# Patient Record
Sex: Female | Born: 1940 | Race: White | Hispanic: No | Marital: Married | Smoking: Never smoker
Health system: Southern US, Community
[De-identification: ages and names within clinical notes are randomized; demographics above are authoritative.]

## PROBLEM LIST (undated history)

## (undated) DIAGNOSIS — R531 Weakness: Secondary | ICD-10-CM

## (undated) DIAGNOSIS — C8228 Follicular lymphoma grade III, unspecified, lymph nodes of multiple sites: Principal | ICD-10-CM

## (undated) DIAGNOSIS — F32A Depression, unspecified: Secondary | ICD-10-CM

## (undated) DIAGNOSIS — D649 Anemia, unspecified: Secondary | ICD-10-CM

## (undated) DIAGNOSIS — I10 Essential (primary) hypertension: Secondary | ICD-10-CM

## (undated) DIAGNOSIS — I5022 Chronic systolic (congestive) heart failure: Principal | ICD-10-CM

## (undated) DIAGNOSIS — I480 Paroxysmal atrial fibrillation: Secondary | ICD-10-CM

## (undated) DIAGNOSIS — I428 Other cardiomyopathies: Secondary | ICD-10-CM

## (undated) DIAGNOSIS — K219 Gastro-esophageal reflux disease without esophagitis: Secondary | ICD-10-CM

## (undated) DIAGNOSIS — J189 Pneumonia, unspecified organism: Secondary | ICD-10-CM

## (undated) DIAGNOSIS — I739 Peripheral vascular disease, unspecified: Secondary | ICD-10-CM

## (undated) DIAGNOSIS — IMO0002 Reserved for concepts with insufficient information to code with codable children: Secondary | ICD-10-CM

## (undated) DIAGNOSIS — R011 Cardiac murmur, unspecified: Secondary | ICD-10-CM

## (undated) DIAGNOSIS — M1990 Unspecified osteoarthritis, unspecified site: Secondary | ICD-10-CM

## (undated) DIAGNOSIS — G620 Drug-induced polyneuropathy: Secondary | ICD-10-CM

## (undated) DIAGNOSIS — K635 Polyp of colon: Secondary | ICD-10-CM

## (undated) DIAGNOSIS — C189 Malignant neoplasm of colon, unspecified: Secondary | ICD-10-CM

## (undated) DIAGNOSIS — Z9081 Acquired absence of spleen: Secondary | ICD-10-CM

## (undated) DIAGNOSIS — C801 Malignant (primary) neoplasm, unspecified: Secondary | ICD-10-CM

## (undated) DIAGNOSIS — C8590 Non-Hodgkin lymphoma, unspecified, unspecified site: Secondary | ICD-10-CM

## (undated) DIAGNOSIS — T451X5A Adverse effect of antineoplastic and immunosuppressive drugs, initial encounter: Secondary | ICD-10-CM

## (undated) DIAGNOSIS — E039 Hypothyroidism, unspecified: Secondary | ICD-10-CM

## (undated) DIAGNOSIS — F329 Major depressive disorder, single episode, unspecified: Secondary | ICD-10-CM

## (undated) DIAGNOSIS — J45909 Unspecified asthma, uncomplicated: Secondary | ICD-10-CM

## (undated) HISTORY — DX: Follicular lymphoma grade III, unspecified, lymph nodes of multiple sites: C82.28

## (undated) HISTORY — DX: Hypothyroidism, unspecified: E03.9

## (undated) HISTORY — DX: Adverse effect of antineoplastic and immunosuppressive drugs, initial encounter: T45.1X5A

## (undated) HISTORY — DX: Chronic systolic (congestive) heart failure: I50.22

## (undated) HISTORY — DX: Malignant (primary) neoplasm, unspecified: C80.1

## (undated) HISTORY — PX: OTHER SURGICAL HISTORY: SHX169

## (undated) HISTORY — DX: Non-Hodgkin lymphoma, unspecified, unspecified site: C85.90

## (undated) HISTORY — DX: Drug-induced polyneuropathy: G62.0

## (undated) HISTORY — DX: Unspecified osteoarthritis, unspecified site: M19.90

## (undated) HISTORY — DX: Malignant neoplasm of colon, unspecified: C18.9

## (undated) HISTORY — PX: CHOLECYSTECTOMY: SHX55

## (undated) HISTORY — DX: Unspecified asthma, uncomplicated: J45.909

## (undated) HISTORY — PX: VAGINAL HYSTERECTOMY: SUR661

## (undated) HISTORY — DX: Peripheral vascular disease, unspecified: I73.9

## (undated) HISTORY — PX: COLONOSCOPY W/ BIOPSIES: SHX1374

## (undated) HISTORY — DX: Other cardiomyopathies: I42.8

## (undated) HISTORY — DX: Paroxysmal atrial fibrillation: I48.0

## (undated) HISTORY — DX: Weakness: R53.1

## (undated) HISTORY — DX: Essential (primary) hypertension: I10

## (undated) HISTORY — DX: Reserved for concepts with insufficient information to code with codable children: IMO0002

## (undated) HISTORY — PX: SPLENECTOMY: SUR1306

## (undated) HISTORY — DX: Depression, unspecified: F32.A

## (undated) HISTORY — DX: Acquired absence of spleen: Z90.81

## (undated) HISTORY — PX: TENDON RELEASE: SHX230

## (undated) HISTORY — DX: Polyp of colon: K63.5

## (undated) HISTORY — PX: PLANTAR FASCIA RELEASE: SHX2239

## (undated) HISTORY — PX: DG BIOPSY LUNG: HXRAD146

## (undated) HISTORY — DX: Major depressive disorder, single episode, unspecified: F32.9

---

## 1994-02-10 HISTORY — PX: BREAST BIOPSY: SHX20

## 1996-02-11 HISTORY — PX: VENTRAL HERNIA REPAIR: SHX424

## 1998-07-02 ENCOUNTER — Encounter (HOSPITAL_COMMUNITY): Payer: Self-pay | Admitting: Oncology

## 1998-07-02 ENCOUNTER — Ambulatory Visit (HOSPITAL_COMMUNITY): Admission: RE | Admit: 1998-07-02 | Discharge: 1998-07-02 | Payer: Self-pay | Admitting: Oncology

## 1999-12-06 ENCOUNTER — Encounter: Payer: Self-pay | Admitting: Family Medicine

## 1999-12-06 ENCOUNTER — Encounter: Admission: RE | Admit: 1999-12-06 | Discharge: 1999-12-06 | Payer: Self-pay | Admitting: Family Medicine

## 1999-12-19 ENCOUNTER — Encounter: Payer: Self-pay | Admitting: Family Medicine

## 1999-12-19 ENCOUNTER — Encounter: Admission: RE | Admit: 1999-12-19 | Discharge: 1999-12-19 | Payer: Self-pay | Admitting: Family Medicine

## 2000-12-15 ENCOUNTER — Ambulatory Visit (HOSPITAL_COMMUNITY): Admission: RE | Admit: 2000-12-15 | Discharge: 2000-12-15 | Payer: Self-pay | Admitting: Oncology

## 2000-12-15 ENCOUNTER — Other Ambulatory Visit: Admission: RE | Admit: 2000-12-15 | Discharge: 2000-12-15 | Payer: Self-pay | Admitting: Oncology

## 2000-12-15 ENCOUNTER — Encounter (HOSPITAL_COMMUNITY): Payer: Self-pay | Admitting: Oncology

## 2000-12-26 ENCOUNTER — Ambulatory Visit (HOSPITAL_COMMUNITY): Admission: RE | Admit: 2000-12-26 | Discharge: 2000-12-26 | Payer: Self-pay | Admitting: Oncology

## 2000-12-26 ENCOUNTER — Encounter (HOSPITAL_COMMUNITY): Payer: Self-pay | Admitting: Oncology

## 2001-02-01 ENCOUNTER — Inpatient Hospital Stay (HOSPITAL_COMMUNITY): Admission: EM | Admit: 2001-02-01 | Discharge: 2001-02-03 | Payer: Self-pay | Admitting: Emergency Medicine

## 2001-02-01 ENCOUNTER — Encounter: Payer: Self-pay | Admitting: Emergency Medicine

## 2001-02-02 ENCOUNTER — Encounter (HOSPITAL_COMMUNITY): Payer: Self-pay | Admitting: Oncology

## 2001-02-05 ENCOUNTER — Ambulatory Visit (HOSPITAL_COMMUNITY): Admission: RE | Admit: 2001-02-05 | Discharge: 2001-02-05 | Payer: Self-pay | Admitting: Oncology

## 2001-02-05 ENCOUNTER — Encounter (HOSPITAL_COMMUNITY): Payer: Self-pay | Admitting: Oncology

## 2001-02-08 ENCOUNTER — Inpatient Hospital Stay (HOSPITAL_COMMUNITY): Admission: EM | Admit: 2001-02-08 | Discharge: 2001-02-17 | Payer: Self-pay | Admitting: Oncology

## 2001-02-08 ENCOUNTER — Encounter (INDEPENDENT_AMBULATORY_CARE_PROVIDER_SITE_OTHER): Payer: Self-pay | Admitting: Specialist

## 2001-02-08 ENCOUNTER — Encounter (HOSPITAL_COMMUNITY): Payer: Self-pay | Admitting: Oncology

## 2001-02-08 ENCOUNTER — Ambulatory Visit (HOSPITAL_COMMUNITY): Admission: RE | Admit: 2001-02-08 | Discharge: 2001-02-08 | Payer: Self-pay | Admitting: Oncology

## 2001-02-09 ENCOUNTER — Encounter (HOSPITAL_COMMUNITY): Payer: Self-pay | Admitting: Oncology

## 2001-02-10 ENCOUNTER — Encounter (HOSPITAL_COMMUNITY): Payer: Self-pay | Admitting: Oncology

## 2001-02-12 ENCOUNTER — Encounter: Payer: Self-pay | Admitting: Pulmonary Disease

## 2001-02-13 ENCOUNTER — Encounter (HOSPITAL_COMMUNITY): Payer: Self-pay | Admitting: Oncology

## 2001-02-15 ENCOUNTER — Encounter (HOSPITAL_COMMUNITY): Payer: Self-pay | Admitting: Oncology

## 2001-02-17 ENCOUNTER — Encounter: Payer: Self-pay | Admitting: Pulmonary Disease

## 2001-03-22 ENCOUNTER — Other Ambulatory Visit: Admission: RE | Admit: 2001-03-22 | Discharge: 2001-03-22 | Payer: Self-pay | Admitting: Family Medicine

## 2001-03-30 ENCOUNTER — Encounter: Payer: Self-pay | Admitting: Family Medicine

## 2001-03-30 ENCOUNTER — Ambulatory Visit (HOSPITAL_COMMUNITY): Admission: RE | Admit: 2001-03-30 | Discharge: 2001-03-30 | Payer: Self-pay | Admitting: Oncology

## 2001-03-30 ENCOUNTER — Encounter: Admission: RE | Admit: 2001-03-30 | Discharge: 2001-03-30 | Payer: Self-pay | Admitting: Family Medicine

## 2001-03-30 ENCOUNTER — Encounter (HOSPITAL_COMMUNITY): Payer: Self-pay | Admitting: Oncology

## 2001-05-21 ENCOUNTER — Emergency Department (HOSPITAL_COMMUNITY): Admission: EM | Admit: 2001-05-21 | Discharge: 2001-05-21 | Payer: Self-pay | Admitting: Emergency Medicine

## 2002-03-24 ENCOUNTER — Other Ambulatory Visit: Admission: RE | Admit: 2002-03-24 | Discharge: 2002-03-24 | Payer: Self-pay | Admitting: Family Medicine

## 2002-03-31 ENCOUNTER — Encounter: Payer: Self-pay | Admitting: Family Medicine

## 2002-03-31 ENCOUNTER — Encounter: Admission: RE | Admit: 2002-03-31 | Discharge: 2002-03-31 | Payer: Self-pay | Admitting: Family Medicine

## 2002-04-29 ENCOUNTER — Encounter (HOSPITAL_COMMUNITY): Payer: Self-pay | Admitting: Oncology

## 2002-04-29 ENCOUNTER — Ambulatory Visit (HOSPITAL_COMMUNITY): Admission: RE | Admit: 2002-04-29 | Discharge: 2002-04-29 | Payer: Self-pay | Admitting: Oncology

## 2002-12-14 ENCOUNTER — Ambulatory Visit (HOSPITAL_COMMUNITY): Admission: RE | Admit: 2002-12-14 | Discharge: 2002-12-14 | Payer: Self-pay | Admitting: Oncology

## 2003-03-27 ENCOUNTER — Other Ambulatory Visit: Admission: RE | Admit: 2003-03-27 | Discharge: 2003-03-27 | Payer: Self-pay | Admitting: Family Medicine

## 2003-04-06 ENCOUNTER — Ambulatory Visit (HOSPITAL_COMMUNITY): Admission: RE | Admit: 2003-04-06 | Discharge: 2003-04-06 | Payer: Self-pay | Admitting: Oncology

## 2003-04-13 ENCOUNTER — Encounter: Admission: RE | Admit: 2003-04-13 | Discharge: 2003-04-13 | Payer: Self-pay | Admitting: Family Medicine

## 2003-05-03 ENCOUNTER — Ambulatory Visit (HOSPITAL_COMMUNITY): Admission: RE | Admit: 2003-05-03 | Discharge: 2003-05-03 | Payer: Self-pay | Admitting: Oncology

## 2003-05-03 ENCOUNTER — Encounter (INDEPENDENT_AMBULATORY_CARE_PROVIDER_SITE_OTHER): Payer: Self-pay | Admitting: Specialist

## 2003-12-14 ENCOUNTER — Ambulatory Visit: Payer: Self-pay | Admitting: Oncology

## 2003-12-28 ENCOUNTER — Ambulatory Visit: Payer: Self-pay | Admitting: Family Medicine

## 2004-02-07 ENCOUNTER — Ambulatory Visit: Payer: Self-pay | Admitting: Family Medicine

## 2004-03-08 ENCOUNTER — Ambulatory Visit: Payer: Self-pay | Admitting: Oncology

## 2004-03-14 ENCOUNTER — Ambulatory Visit: Payer: Self-pay | Admitting: Family Medicine

## 2004-03-15 ENCOUNTER — Ambulatory Visit: Payer: Self-pay | Admitting: Family Medicine

## 2004-04-19 ENCOUNTER — Ambulatory Visit: Payer: Self-pay | Admitting: Family Medicine

## 2004-04-19 ENCOUNTER — Other Ambulatory Visit: Admission: RE | Admit: 2004-04-19 | Discharge: 2004-04-19 | Payer: Self-pay | Admitting: Family Medicine

## 2004-05-03 ENCOUNTER — Encounter: Admission: RE | Admit: 2004-05-03 | Discharge: 2004-05-03 | Payer: Self-pay | Admitting: Family Medicine

## 2004-05-13 ENCOUNTER — Encounter: Admission: RE | Admit: 2004-05-13 | Discharge: 2004-05-13 | Payer: Self-pay | Admitting: Family Medicine

## 2004-05-13 ENCOUNTER — Encounter (INDEPENDENT_AMBULATORY_CARE_PROVIDER_SITE_OTHER): Payer: Self-pay | Admitting: *Deleted

## 2004-05-17 ENCOUNTER — Ambulatory Visit: Payer: Self-pay | Admitting: Oncology

## 2004-05-17 ENCOUNTER — Ambulatory Visit: Payer: Self-pay | Admitting: Gastroenterology

## 2004-05-31 ENCOUNTER — Ambulatory Visit: Payer: Self-pay | Admitting: Gastroenterology

## 2004-06-07 ENCOUNTER — Ambulatory Visit: Payer: Self-pay | Admitting: Family Medicine

## 2004-07-04 ENCOUNTER — Ambulatory Visit: Payer: Self-pay | Admitting: Oncology

## 2004-07-05 ENCOUNTER — Ambulatory Visit: Payer: Self-pay | Admitting: Family Medicine

## 2004-07-30 ENCOUNTER — Ambulatory Visit: Payer: Self-pay | Admitting: Family Medicine

## 2004-08-27 ENCOUNTER — Ambulatory Visit: Payer: Self-pay | Admitting: Family Medicine

## 2004-08-28 ENCOUNTER — Ambulatory Visit: Payer: Self-pay | Admitting: Cardiology

## 2004-09-26 ENCOUNTER — Ambulatory Visit: Payer: Self-pay | Admitting: Oncology

## 2004-10-04 ENCOUNTER — Ambulatory Visit: Payer: Self-pay | Admitting: Family Medicine

## 2004-10-24 ENCOUNTER — Ambulatory Visit: Payer: Self-pay | Admitting: Family Medicine

## 2004-11-07 ENCOUNTER — Ambulatory Visit: Payer: Self-pay | Admitting: Family Medicine

## 2004-11-15 ENCOUNTER — Ambulatory Visit: Payer: Self-pay | Admitting: Family Medicine

## 2004-11-19 ENCOUNTER — Ambulatory Visit: Payer: Self-pay | Admitting: Family Medicine

## 2004-12-02 ENCOUNTER — Ambulatory Visit (HOSPITAL_COMMUNITY): Admission: RE | Admit: 2004-12-02 | Discharge: 2004-12-02 | Payer: Self-pay | Admitting: Oncology

## 2004-12-05 ENCOUNTER — Ambulatory Visit: Payer: Self-pay | Admitting: Oncology

## 2004-12-06 ENCOUNTER — Other Ambulatory Visit: Admission: RE | Admit: 2004-12-06 | Discharge: 2004-12-06 | Payer: Self-pay | Admitting: Oncology

## 2004-12-06 ENCOUNTER — Encounter (HOSPITAL_COMMUNITY): Payer: Self-pay | Admitting: Oncology

## 2004-12-06 ENCOUNTER — Ambulatory Visit: Payer: Self-pay | Admitting: Internal Medicine

## 2004-12-17 ENCOUNTER — Ambulatory Visit: Payer: Self-pay | Admitting: Family Medicine

## 2005-02-13 ENCOUNTER — Ambulatory Visit: Payer: Self-pay | Admitting: Family Medicine

## 2005-03-07 ENCOUNTER — Ambulatory Visit: Payer: Self-pay | Admitting: Oncology

## 2005-06-05 ENCOUNTER — Ambulatory Visit: Payer: Self-pay | Admitting: Oncology

## 2005-06-06 LAB — CBC WITH DIFFERENTIAL/PLATELET
BASO%: 0.9 % (ref 0.0–2.0)
EOS%: 3.3 % (ref 0.0–7.0)
MCH: 30.6 pg (ref 26.0–34.0)
MCHC: 33.1 g/dL (ref 32.0–36.0)
MCV: 92.6 fL (ref 81.0–101.0)
MONO%: 13.5 % — ABNORMAL HIGH (ref 0.0–13.0)
RBC: 4.19 10*6/uL (ref 3.70–5.32)
RDW: 13.9 % (ref 11.3–14.5)
WBC: 6.7 10*3/uL (ref 3.9–10.0)

## 2005-06-06 LAB — COMPREHENSIVE METABOLIC PANEL
ALT: 28 U/L (ref 0–40)
AST: 26 U/L (ref 0–37)
Alkaline Phosphatase: 81 U/L (ref 39–117)
BUN: 11 mg/dL (ref 6–23)
Calcium: 9 mg/dL (ref 8.4–10.5)
Creatinine, Ser: 0.8 mg/dL (ref 0.4–1.2)
Total Bilirubin: 0.4 mg/dL (ref 0.3–1.2)

## 2005-06-11 ENCOUNTER — Encounter: Payer: Self-pay | Admitting: Family Medicine

## 2005-06-11 ENCOUNTER — Other Ambulatory Visit: Admission: RE | Admit: 2005-06-11 | Discharge: 2005-06-11 | Payer: Self-pay | Admitting: Family Medicine

## 2005-06-11 ENCOUNTER — Ambulatory Visit: Payer: Self-pay | Admitting: Family Medicine

## 2005-06-11 LAB — CONVERTED CEMR LAB: Pap Smear: NORMAL

## 2005-06-24 ENCOUNTER — Encounter: Admission: RE | Admit: 2005-06-24 | Discharge: 2005-06-24 | Payer: Self-pay | Admitting: Family Medicine

## 2005-09-02 ENCOUNTER — Ambulatory Visit (HOSPITAL_COMMUNITY): Admission: RE | Admit: 2005-09-02 | Discharge: 2005-09-02 | Payer: Self-pay | Admitting: Oncology

## 2005-09-03 ENCOUNTER — Ambulatory Visit: Payer: Self-pay | Admitting: Oncology

## 2005-09-08 LAB — CBC WITH DIFFERENTIAL/PLATELET
BASO%: 0.4 % (ref 0.0–2.0)
Basophils Absolute: 0 10e3/uL (ref 0.0–0.1)
EOS%: 2.8 % (ref 0.0–7.0)
Eosinophils Absolute: 0.2 10e3/uL (ref 0.0–0.5)
HCT: 39.9 % (ref 34.8–46.6)
HGB: 13.3 g/dL (ref 11.6–15.9)
LYMPH%: 32 % (ref 14.0–48.0)
MCH: 31 pg (ref 26.0–34.0)
MCHC: 33.4 g/dL (ref 32.0–36.0)
MCV: 92.6 fL (ref 81.0–101.0)
MONO#: 0.8 10e3/uL (ref 0.1–0.9)
MONO%: 11.4 % (ref 0.0–13.0)
NEUT#: 3.7 10e3/uL (ref 1.5–6.5)
NEUT%: 53.4 % (ref 39.6–76.8)
Platelets: 320 10e3/uL (ref 145–400)
RBC: 4.31 10e6/uL (ref 3.70–5.32)
RDW: 14 % (ref 11.3–14.5)
WBC: 6.9 10e3/uL (ref 3.9–10.0)
lymph#: 2.2 10e3/uL (ref 0.9–3.3)

## 2005-09-08 LAB — COMPREHENSIVE METABOLIC PANEL
ALT: 24 U/L (ref 0–40)
BUN: 9 mg/dL (ref 6–23)
CO2: 29 mEq/L (ref 19–32)
Creatinine, Ser: 0.8 mg/dL (ref 0.40–1.20)
Glucose, Bld: 136 mg/dL — ABNORMAL HIGH (ref 70–99)
Total Bilirubin: 0.4 mg/dL (ref 0.3–1.2)

## 2005-09-08 LAB — LACTATE DEHYDROGENASE: LDH: 149 U/L (ref 94–250)

## 2005-12-03 ENCOUNTER — Ambulatory Visit: Payer: Self-pay | Admitting: Oncology

## 2005-12-12 ENCOUNTER — Ambulatory Visit: Payer: Self-pay | Admitting: Family Medicine

## 2006-01-23 ENCOUNTER — Ambulatory Visit: Payer: Self-pay | Admitting: Family Medicine

## 2006-01-28 ENCOUNTER — Ambulatory Visit: Payer: Self-pay | Admitting: Family Medicine

## 2006-03-03 ENCOUNTER — Ambulatory Visit: Payer: Self-pay | Admitting: Oncology

## 2006-03-06 LAB — COMPREHENSIVE METABOLIC PANEL
ALT: 26 U/L (ref 0–35)
AST: 25 U/L (ref 0–37)
CO2: 27 mEq/L (ref 19–32)
Chloride: 104 mEq/L (ref 96–112)
Sodium: 140 mEq/L (ref 135–145)
Total Bilirubin: 0.4 mg/dL (ref 0.3–1.2)
Total Protein: 6.3 g/dL (ref 6.0–8.3)

## 2006-03-06 LAB — CBC WITH DIFFERENTIAL/PLATELET
BASO%: 0.2 % (ref 0.0–2.0)
EOS%: 4.1 % (ref 0.0–7.0)
LYMPH%: 35.9 % (ref 14.0–48.0)
MCHC: 33.2 g/dL (ref 32.0–36.0)
MONO#: 1 10*3/uL — ABNORMAL HIGH (ref 0.1–0.9)
RBC: 4.29 10*6/uL (ref 3.70–5.32)
lymph#: 2.2 10*3/uL (ref 0.9–3.3)

## 2006-03-06 LAB — LACTATE DEHYDROGENASE: LDH: 134 U/L (ref 94–250)

## 2006-05-12 ENCOUNTER — Ambulatory Visit: Payer: Self-pay | Admitting: Family Medicine

## 2006-06-25 ENCOUNTER — Encounter: Payer: Self-pay | Admitting: Family Medicine

## 2006-06-25 DIAGNOSIS — Z85828 Personal history of other malignant neoplasm of skin: Secondary | ICD-10-CM

## 2006-06-25 DIAGNOSIS — Z860100 Personal history of colon polyps, unspecified: Secondary | ICD-10-CM | POA: Insufficient documentation

## 2006-06-25 DIAGNOSIS — E039 Hypothyroidism, unspecified: Secondary | ICD-10-CM | POA: Insufficient documentation

## 2006-06-25 DIAGNOSIS — R609 Edema, unspecified: Secondary | ICD-10-CM

## 2006-06-25 DIAGNOSIS — Z8572 Personal history of non-Hodgkin lymphomas: Secondary | ICD-10-CM

## 2006-06-25 DIAGNOSIS — I739 Peripheral vascular disease, unspecified: Secondary | ICD-10-CM | POA: Insufficient documentation

## 2006-06-25 DIAGNOSIS — F329 Major depressive disorder, single episode, unspecified: Secondary | ICD-10-CM

## 2006-06-25 DIAGNOSIS — M1990 Unspecified osteoarthritis, unspecified site: Secondary | ICD-10-CM | POA: Insufficient documentation

## 2006-07-01 ENCOUNTER — Ambulatory Visit: Payer: Self-pay | Admitting: Oncology

## 2006-07-03 LAB — CBC WITH DIFFERENTIAL/PLATELET
BASO%: 0.3 % (ref 0.0–2.0)
EOS%: 3.5 % (ref 0.0–7.0)
HCT: 40.2 % (ref 34.8–46.6)
LYMPH%: 42.7 % (ref 14.0–48.0)
MCH: 31.6 pg (ref 26.0–34.0)
MCHC: 34.9 g/dL (ref 32.0–36.0)
MONO%: 16.3 % — ABNORMAL HIGH (ref 0.0–13.0)
NEUT%: 37.2 % — ABNORMAL LOW (ref 39.6–76.8)
Platelets: 264 10*3/uL (ref 145–400)
RBC: 4.44 10*6/uL (ref 3.70–5.32)

## 2006-07-03 LAB — COMPREHENSIVE METABOLIC PANEL
ALT: 44 U/L — ABNORMAL HIGH (ref 0–35)
AST: 46 U/L — ABNORMAL HIGH (ref 0–37)
Alkaline Phosphatase: 87 U/L (ref 39–117)
Creatinine, Ser: 0.86 mg/dL (ref 0.40–1.20)
Total Bilirubin: 0.6 mg/dL (ref 0.3–1.2)

## 2006-07-03 LAB — LACTATE DEHYDROGENASE: LDH: 152 U/L (ref 94–250)

## 2006-07-08 ENCOUNTER — Ambulatory Visit: Payer: Self-pay | Admitting: Family Medicine

## 2006-07-08 DIAGNOSIS — J309 Allergic rhinitis, unspecified: Secondary | ICD-10-CM | POA: Insufficient documentation

## 2006-07-08 DIAGNOSIS — N318 Other neuromuscular dysfunction of bladder: Secondary | ICD-10-CM

## 2006-07-08 DIAGNOSIS — E785 Hyperlipidemia, unspecified: Secondary | ICD-10-CM

## 2006-07-09 LAB — CONVERTED CEMR LAB
HDL: 47.6 mg/dL (ref >39.0)
VLDL: 32 mg/dL (ref 0–40)

## 2006-07-10 ENCOUNTER — Telehealth (INDEPENDENT_AMBULATORY_CARE_PROVIDER_SITE_OTHER): Payer: Self-pay | Admitting: *Deleted

## 2006-07-23 ENCOUNTER — Encounter: Payer: Self-pay | Admitting: Family Medicine

## 2006-07-24 ENCOUNTER — Encounter: Admission: RE | Admit: 2006-07-24 | Discharge: 2006-07-24 | Payer: Self-pay | Admitting: Family Medicine

## 2006-07-28 ENCOUNTER — Encounter (INDEPENDENT_AMBULATORY_CARE_PROVIDER_SITE_OTHER): Payer: Self-pay | Admitting: *Deleted

## 2006-08-31 ENCOUNTER — Telehealth (INDEPENDENT_AMBULATORY_CARE_PROVIDER_SITE_OTHER): Payer: Self-pay | Admitting: *Deleted

## 2006-10-28 ENCOUNTER — Ambulatory Visit: Payer: Self-pay | Admitting: Oncology

## 2006-11-06 ENCOUNTER — Encounter: Payer: Self-pay | Admitting: Family Medicine

## 2006-11-06 LAB — CBC WITH DIFFERENTIAL/PLATELET
Basophils Absolute: 0 10*3/uL (ref 0.0–0.1)
EOS%: 2.8 % (ref 0.0–7.0)
HCT: 38.2 % (ref 34.8–46.6)
HGB: 13.3 g/dL (ref 11.6–15.9)
LYMPH%: 48.7 % — ABNORMAL HIGH (ref 14.0–48.0)
MCH: 32.2 pg (ref 26.0–34.0)
MCV: 92.3 fL (ref 81.0–101.0)
NEUT%: 36.8 % — ABNORMAL LOW (ref 39.6–76.8)
Platelets: 229 10*3/uL (ref 145–400)
lymph#: 4.1 10*3/uL — ABNORMAL HIGH (ref 0.9–3.3)

## 2006-11-06 LAB — COMPREHENSIVE METABOLIC PANEL
AST: 31 U/L (ref 0–37)
BUN: 10 mg/dL (ref 6–23)
Calcium: 8.8 mg/dL (ref 8.4–10.5)
Chloride: 101 mEq/L (ref 96–112)
Creatinine, Ser: 0.77 mg/dL (ref 0.40–1.20)

## 2006-11-06 LAB — LACTATE DEHYDROGENASE: LDH: 159 U/L (ref 94–250)

## 2006-11-20 ENCOUNTER — Ambulatory Visit: Payer: Self-pay | Admitting: Family Medicine

## 2006-11-20 ENCOUNTER — Ambulatory Visit (HOSPITAL_COMMUNITY): Admission: RE | Admit: 2006-11-20 | Discharge: 2006-11-20 | Payer: Self-pay | Admitting: Oncology

## 2007-03-10 ENCOUNTER — Ambulatory Visit: Payer: Self-pay | Admitting: Oncology

## 2007-03-12 ENCOUNTER — Encounter: Payer: Self-pay | Admitting: Family Medicine

## 2007-03-12 LAB — CBC WITH DIFFERENTIAL/PLATELET
Basophils Absolute: 0 10*3/uL (ref 0.0–0.1)
EOS%: 3.5 % (ref 0.0–7.0)
Eosinophils Absolute: 0.3 10*3/uL (ref 0.0–0.5)
HCT: 40.5 % (ref 34.8–46.6)
HGB: 13.8 g/dL (ref 11.6–15.9)
MCH: 31.6 pg (ref 26.0–34.0)
MONO#: 0.9 10*3/uL (ref 0.1–0.9)
NEUT#: 3 10*3/uL (ref 1.5–6.5)
NEUT%: 40.8 % (ref 39.6–76.8)
RDW: 13.9 % (ref 11.3–14.5)
WBC: 7.4 10*3/uL (ref 3.9–10.0)
lymph#: 3.1 10*3/uL (ref 0.9–3.3)

## 2007-03-12 LAB — COMPREHENSIVE METABOLIC PANEL
AST: 25 U/L (ref 0–37)
Albumin: 4.6 g/dL (ref 3.5–5.2)
BUN: 13 mg/dL (ref 6–23)
CO2: 26 mEq/L (ref 19–32)
Calcium: 9.5 mg/dL (ref 8.4–10.5)
Chloride: 103 mEq/L (ref 96–112)
Creatinine, Ser: 0.81 mg/dL (ref 0.40–1.20)
Potassium: 4.3 mEq/L (ref 3.5–5.3)

## 2007-03-12 LAB — LACTATE DEHYDROGENASE: LDH: 136 U/L (ref 94–250)

## 2007-06-10 ENCOUNTER — Ambulatory Visit: Payer: Self-pay | Admitting: Family Medicine

## 2007-06-15 ENCOUNTER — Encounter (INDEPENDENT_AMBULATORY_CARE_PROVIDER_SITE_OTHER): Payer: Self-pay | Admitting: *Deleted

## 2007-06-15 ENCOUNTER — Telehealth: Payer: Self-pay | Admitting: Family Medicine

## 2007-06-16 ENCOUNTER — Encounter (INDEPENDENT_AMBULATORY_CARE_PROVIDER_SITE_OTHER): Payer: Self-pay | Admitting: *Deleted

## 2007-07-07 ENCOUNTER — Ambulatory Visit: Payer: Self-pay | Admitting: Oncology

## 2007-07-09 ENCOUNTER — Encounter: Payer: Self-pay | Admitting: Family Medicine

## 2007-07-09 LAB — COMPREHENSIVE METABOLIC PANEL
ALT: 21 U/L (ref 0–35)
Alkaline Phosphatase: 63 U/L (ref 39–117)
Sodium: 141 mEq/L (ref 135–145)
Total Bilirubin: 0.5 mg/dL (ref 0.3–1.2)
Total Protein: 6.3 g/dL (ref 6.0–8.3)

## 2007-07-09 LAB — CBC WITH DIFFERENTIAL/PLATELET
BASO%: 0 % (ref 0.0–2.0)
LYMPH%: 31.6 % (ref 14.0–48.0)
MCHC: 33.7 g/dL (ref 32.0–36.0)
MCV: 92.1 fL (ref 81.0–101.0)
MONO%: 16.8 % — ABNORMAL HIGH (ref 0.0–13.0)
Platelets: 275 10*3/uL (ref 145–400)
RBC: 4.29 10*6/uL (ref 3.70–5.32)

## 2007-07-13 ENCOUNTER — Ambulatory Visit: Payer: Self-pay | Admitting: Family Medicine

## 2007-07-13 DIAGNOSIS — N951 Menopausal and female climacteric states: Secondary | ICD-10-CM

## 2007-07-29 ENCOUNTER — Encounter: Admission: RE | Admit: 2007-07-29 | Discharge: 2007-07-29 | Payer: Self-pay | Admitting: Family Medicine

## 2007-08-03 ENCOUNTER — Encounter (INDEPENDENT_AMBULATORY_CARE_PROVIDER_SITE_OTHER): Payer: Self-pay | Admitting: *Deleted

## 2007-08-10 ENCOUNTER — Ambulatory Visit: Payer: Self-pay | Admitting: Family Medicine

## 2007-08-16 LAB — CONVERTED CEMR LAB: Vit D, 1,25-Dihydroxy: 38 (ref 30–89)

## 2007-08-20 ENCOUNTER — Encounter: Payer: Self-pay | Admitting: Family Medicine

## 2007-11-10 ENCOUNTER — Ambulatory Visit: Payer: Self-pay | Admitting: Oncology

## 2007-11-12 ENCOUNTER — Encounter: Payer: Self-pay | Admitting: Family Medicine

## 2007-11-12 LAB — CBC WITH DIFFERENTIAL/PLATELET
BASO%: 0.6 % (ref 0.0–2.0)
EOS%: 3.8 % (ref 0.0–7.0)
HCT: 40.6 % (ref 34.8–46.6)
LYMPH%: 32.9 % (ref 14.0–48.0)
MCH: 31.4 pg (ref 26.0–34.0)
MCHC: 34 g/dL (ref 32.0–36.0)
MCV: 92.2 fL (ref 81.0–101.0)
MONO%: 15.6 % — ABNORMAL HIGH (ref 0.0–13.0)
NEUT%: 47.1 % (ref 39.6–76.8)
Platelets: 225 10*3/uL (ref 145–400)

## 2007-11-12 LAB — COMPREHENSIVE METABOLIC PANEL
ALT: 24 U/L (ref 0–35)
AST: 23 U/L (ref 0–37)
Creatinine, Ser: 0.8 mg/dL (ref 0.40–1.20)
Total Bilirubin: 0.6 mg/dL (ref 0.3–1.2)

## 2007-11-12 LAB — LACTATE DEHYDROGENASE: LDH: 141 U/L (ref 94–250)

## 2007-12-15 ENCOUNTER — Ambulatory Visit: Payer: Self-pay | Admitting: Family Medicine

## 2008-01-19 ENCOUNTER — Ambulatory Visit: Payer: Self-pay | Admitting: Family Medicine

## 2008-03-01 ENCOUNTER — Ambulatory Visit: Payer: Self-pay | Admitting: Family Medicine

## 2008-03-02 ENCOUNTER — Ambulatory Visit: Payer: Self-pay | Admitting: Family Medicine

## 2008-04-05 ENCOUNTER — Ambulatory Visit: Payer: Self-pay | Admitting: Oncology

## 2008-04-07 ENCOUNTER — Encounter: Payer: Self-pay | Admitting: Family Medicine

## 2008-04-07 LAB — CBC WITH DIFFERENTIAL/PLATELET
Basophils Absolute: 0 10*3/uL (ref 0.0–0.1)
EOS%: 1.8 % (ref 0.0–7.0)
Eosinophils Absolute: 0.1 10*3/uL (ref 0.0–0.5)
HCT: 40.5 % (ref 34.8–46.6)
HGB: 13.4 g/dL (ref 11.6–15.9)
MCH: 31 pg (ref 25.1–34.0)
MCV: 93.6 fL (ref 79.5–101.0)
MONO%: 11.8 % (ref 0.0–14.0)
NEUT#: 4.2 10*3/uL (ref 1.5–6.5)
NEUT%: 59.6 % (ref 38.4–76.8)
RDW: 14.1 % (ref 11.2–14.5)

## 2008-04-07 LAB — LACTATE DEHYDROGENASE: LDH: 146 U/L (ref 94–250)

## 2008-04-07 LAB — COMPREHENSIVE METABOLIC PANEL
ALT: 15 U/L (ref 0–35)
Alkaline Phosphatase: 69 U/L (ref 39–117)
CO2: 28 mEq/L (ref 19–32)
Sodium: 141 mEq/L (ref 135–145)
Total Bilirubin: 0.5 mg/dL (ref 0.3–1.2)
Total Protein: 6.4 g/dL (ref 6.0–8.3)

## 2008-05-04 ENCOUNTER — Ambulatory Visit: Payer: Self-pay | Admitting: Family Medicine

## 2008-07-05 ENCOUNTER — Ambulatory Visit: Payer: Self-pay | Admitting: Family Medicine

## 2008-07-07 LAB — CONVERTED CEMR LAB
ALT: 21 units/L (ref 0–35)
Alkaline Phosphatase: 61 units/L (ref 39–117)
Basophils Absolute: 0 10*3/uL (ref 0.0–0.1)
Calcium: 9.2 mg/dL (ref 8.4–10.5)
Chloride: 111 meq/L (ref 96–112)
Creatinine, Ser: 0.8 mg/dL (ref 0.4–1.2)
Glucose, Bld: 101 mg/dL — ABNORMAL HIGH (ref 70–99)
HCT: 40.6 % (ref 36.0–46.0)
Hemoglobin: 13.8 g/dL (ref 12.0–15.0)
LDL Cholesterol: 100 mg/dL — ABNORMAL HIGH (ref 0–99)
Lymphocytes Relative: 33 % (ref 12.0–46.0)
MCV: 93.8 fL (ref 78.0–100.0)
Monocytes Relative: 18.6 % — ABNORMAL HIGH (ref 3.0–12.0)
Phosphorus: 4.9 mg/dL — ABNORMAL HIGH (ref 2.3–4.6)
RDW: 13.2 % (ref 11.5–14.6)
TSH: 2.67 microintl units/mL (ref 0.35–5.50)
Total Bilirubin: 0.8 mg/dL (ref 0.3–1.2)
Total CHOL/HDL Ratio: 3
Total Protein: 6.5 g/dL (ref 6.0–8.3)
Triglycerides: 168 mg/dL — ABNORMAL HIGH (ref 0.0–149.0)

## 2008-07-13 ENCOUNTER — Encounter: Payer: Self-pay | Admitting: Family Medicine

## 2008-07-17 ENCOUNTER — Ambulatory Visit: Payer: Self-pay | Admitting: Family Medicine

## 2008-07-17 ENCOUNTER — Other Ambulatory Visit: Admission: RE | Admit: 2008-07-17 | Discharge: 2008-07-17 | Payer: Self-pay | Admitting: Family Medicine

## 2008-07-17 ENCOUNTER — Encounter: Payer: Self-pay | Admitting: Family Medicine

## 2008-07-17 DIAGNOSIS — M899 Disorder of bone, unspecified: Secondary | ICD-10-CM | POA: Insufficient documentation

## 2008-07-17 DIAGNOSIS — M949 Disorder of cartilage, unspecified: Secondary | ICD-10-CM

## 2008-07-17 LAB — HM PAP SMEAR

## 2008-07-31 ENCOUNTER — Encounter: Admission: RE | Admit: 2008-07-31 | Discharge: 2008-07-31 | Payer: Self-pay | Admitting: Family Medicine

## 2008-08-02 ENCOUNTER — Ambulatory Visit: Payer: Self-pay | Admitting: Oncology

## 2008-08-03 ENCOUNTER — Encounter (INDEPENDENT_AMBULATORY_CARE_PROVIDER_SITE_OTHER): Payer: Self-pay | Admitting: *Deleted

## 2008-08-04 ENCOUNTER — Encounter: Payer: Self-pay | Admitting: Family Medicine

## 2008-08-04 LAB — CBC WITH DIFFERENTIAL/PLATELET
BASO%: 0.6 % (ref 0.0–2.0)
EOS%: 4.8 % (ref 0.0–7.0)
Eosinophils Absolute: 0.3 10*3/uL (ref 0.0–0.5)
LYMPH%: 34.5 % (ref 14.0–49.7)
MONO#: 1 10*3/uL — ABNORMAL HIGH (ref 0.1–0.9)
MONO%: 18.2 % — ABNORMAL HIGH (ref 0.0–14.0)
NEUT#: 2.2 10*3/uL (ref 1.5–6.5)
RBC: 4.38 10*6/uL (ref 3.70–5.45)
RDW: 14.2 % (ref 11.2–14.5)
WBC: 5.2 10*3/uL (ref 3.9–10.3)

## 2008-08-04 LAB — COMPREHENSIVE METABOLIC PANEL
ALT: 15 U/L (ref 0–35)
AST: 18 U/L (ref 0–37)
Alkaline Phosphatase: 63 U/L (ref 39–117)
BUN: 10 mg/dL (ref 6–23)
Calcium: 9.5 mg/dL (ref 8.4–10.5)
Creatinine, Ser: 0.77 mg/dL (ref 0.40–1.20)
Total Bilirubin: 0.4 mg/dL (ref 0.3–1.2)

## 2008-08-11 ENCOUNTER — Telehealth: Payer: Self-pay | Admitting: Family Medicine

## 2008-08-22 ENCOUNTER — Ambulatory Visit: Payer: Self-pay | Admitting: Family Medicine

## 2008-08-22 DIAGNOSIS — F411 Generalized anxiety disorder: Secondary | ICD-10-CM | POA: Insufficient documentation

## 2008-10-09 ENCOUNTER — Ambulatory Visit: Payer: Self-pay | Admitting: Family Medicine

## 2008-10-23 ENCOUNTER — Telehealth: Payer: Self-pay | Admitting: Family Medicine

## 2008-11-23 ENCOUNTER — Ambulatory Visit: Payer: Self-pay | Admitting: Family Medicine

## 2008-11-24 LAB — CONVERTED CEMR LAB
Eosinophils Absolute: 0.4 10*3/uL (ref 0.0–0.7)
Eosinophils Relative: 6.2 % — ABNORMAL HIGH (ref 0.0–5.0)
Hemoglobin: 12.9 g/dL (ref 12.0–15.0)
MCV: 94.1 fL (ref 78.0–100.0)
Monocytes Absolute: 0.6 10*3/uL (ref 0.1–1.0)
Neutro Abs: 4.2 10*3/uL (ref 1.4–7.7)
Platelets: 227 10*3/uL (ref 150.0–400.0)
RBC: 4.09 M/uL (ref 3.87–5.11)

## 2008-11-30 ENCOUNTER — Encounter: Payer: Self-pay | Admitting: Family Medicine

## 2008-12-20 ENCOUNTER — Ambulatory Visit: Payer: Self-pay | Admitting: Family Medicine

## 2008-12-20 DIAGNOSIS — K219 Gastro-esophageal reflux disease without esophagitis: Secondary | ICD-10-CM

## 2008-12-21 ENCOUNTER — Encounter: Payer: Self-pay | Admitting: Family Medicine

## 2008-12-21 LAB — HM DEXA SCAN: HM Dexa Scan: NORMAL

## 2008-12-22 ENCOUNTER — Telehealth: Payer: Self-pay | Admitting: Family Medicine

## 2008-12-25 ENCOUNTER — Telehealth: Payer: Self-pay | Admitting: Family Medicine

## 2008-12-29 ENCOUNTER — Encounter: Payer: Self-pay | Admitting: Family Medicine

## 2009-01-08 ENCOUNTER — Ambulatory Visit: Payer: Self-pay | Admitting: Internal Medicine

## 2009-01-10 ENCOUNTER — Ambulatory Visit: Payer: Self-pay | Admitting: Family Medicine

## 2009-01-11 LAB — CONVERTED CEMR LAB: Vit D, 25-Hydroxy: 32 ng/mL (ref 30–89)

## 2009-01-24 ENCOUNTER — Ambulatory Visit: Payer: Self-pay | Admitting: Oncology

## 2009-01-26 ENCOUNTER — Encounter: Payer: Self-pay | Admitting: Family Medicine

## 2009-01-26 LAB — CBC WITH DIFFERENTIAL/PLATELET
Basophils Absolute: 0.1 10*3/uL (ref 0.0–0.1)
EOS%: 3 % (ref 0.0–7.0)
HGB: 13 g/dL (ref 11.6–15.9)
MCH: 29.8 pg (ref 25.1–34.0)
MCV: 90.1 fL (ref 79.5–101.0)
MONO%: 15.7 % — ABNORMAL HIGH (ref 0.0–14.0)
NEUT%: 63.6 % (ref 38.4–76.8)
RDW: 13.8 % (ref 11.2–14.5)

## 2009-01-26 LAB — COMPREHENSIVE METABOLIC PANEL
Albumin: 4.3 g/dL (ref 3.5–5.2)
Alkaline Phosphatase: 81 U/L (ref 39–117)
BUN: 11 mg/dL (ref 6–23)
Calcium: 9.6 mg/dL (ref 8.4–10.5)
Chloride: 104 mEq/L (ref 96–112)
Creatinine, Ser: 0.76 mg/dL (ref 0.40–1.20)
Glucose, Bld: 100 mg/dL — ABNORMAL HIGH (ref 70–99)
Potassium: 4.3 mEq/L (ref 3.5–5.3)

## 2009-01-29 ENCOUNTER — Ambulatory Visit: Payer: Self-pay | Admitting: Family Medicine

## 2009-03-08 ENCOUNTER — Ambulatory Visit: Payer: Self-pay | Admitting: Family Medicine

## 2009-03-26 ENCOUNTER — Ambulatory Visit: Payer: Self-pay | Admitting: Family Medicine

## 2009-03-30 LAB — CONVERTED CEMR LAB
Basophils Absolute: 0.1 10*3/uL (ref 0.0–0.1)
Hemoglobin: 12.3 g/dL (ref 12.0–15.0)
Lymphocytes Relative: 13.1 % (ref 12.0–46.0)
MCHC: 32.7 g/dL (ref 30.0–36.0)
MCV: 91.8 fL (ref 78.0–100.0)
Monocytes Relative: 3.4 % (ref 3.0–12.0)
Neutrophils Relative %: 80.5 % — ABNORMAL HIGH (ref 43.0–77.0)
Platelets: 306 10*3/uL (ref 150.0–400.0)

## 2009-05-28 ENCOUNTER — Encounter (INDEPENDENT_AMBULATORY_CARE_PROVIDER_SITE_OTHER): Payer: Self-pay | Admitting: *Deleted

## 2009-07-03 ENCOUNTER — Ambulatory Visit: Payer: Self-pay | Admitting: Family Medicine

## 2009-07-03 DIAGNOSIS — R070 Pain in throat: Secondary | ICD-10-CM | POA: Insufficient documentation

## 2009-07-03 DIAGNOSIS — J320 Chronic maxillary sinusitis: Secondary | ICD-10-CM

## 2009-07-04 ENCOUNTER — Encounter: Payer: Self-pay | Admitting: Family Medicine

## 2009-07-05 ENCOUNTER — Encounter: Admission: RE | Admit: 2009-07-05 | Discharge: 2009-07-05 | Payer: Self-pay | Admitting: Family Medicine

## 2009-07-10 ENCOUNTER — Encounter: Payer: Self-pay | Admitting: Family Medicine

## 2009-07-11 ENCOUNTER — Encounter: Payer: Self-pay | Admitting: Family Medicine

## 2009-07-18 ENCOUNTER — Ambulatory Visit: Payer: Self-pay | Admitting: Family Medicine

## 2009-07-18 ENCOUNTER — Encounter (INDEPENDENT_AMBULATORY_CARE_PROVIDER_SITE_OTHER): Payer: Self-pay | Admitting: *Deleted

## 2009-07-19 LAB — CONVERTED CEMR LAB: Vit D, 25-Hydroxy: 43 ng/mL (ref 30–89)

## 2009-07-23 LAB — CONVERTED CEMR LAB
Alkaline Phosphatase: 101 units/L (ref 39–117)
BUN: 11 mg/dL (ref 6–23)
CO2: 30 meq/L (ref 19–32)
Cholesterol: 196 mg/dL (ref 0–200)
Glucose, Bld: 91 mg/dL (ref 70–99)
HDL: 60.1 mg/dL (ref >39.00)
Sodium: 143 meq/L (ref 135–145)
TSH: 2.03 microintl units/mL (ref 0.35–5.50)
Total Bilirubin: 0.5 mg/dL (ref 0.3–1.2)
Total CHOL/HDL Ratio: 3
Triglycerides: 155 mg/dL — ABNORMAL HIGH (ref 0.0–149.0)
VLDL: 31 mg/dL (ref 0.0–40.0)

## 2009-07-24 ENCOUNTER — Encounter: Payer: Self-pay | Admitting: Family Medicine

## 2009-07-25 ENCOUNTER — Ambulatory Visit: Payer: Self-pay | Admitting: Oncology

## 2009-07-27 ENCOUNTER — Encounter: Payer: Self-pay | Admitting: Family Medicine

## 2009-07-27 LAB — COMPREHENSIVE METABOLIC PANEL
ALT: 14 U/L (ref 0–35)
AST: 18 U/L (ref 0–37)
Albumin: 4.2 g/dL (ref 3.5–5.2)
Alkaline Phosphatase: 98 U/L (ref 39–117)
Calcium: 9.1 mg/dL (ref 8.4–10.5)
Chloride: 104 mEq/L (ref 96–112)
Potassium: 4.7 mEq/L (ref 3.5–5.3)

## 2009-07-27 LAB — CBC WITH DIFFERENTIAL/PLATELET
BASO%: 0.6 % (ref 0.0–2.0)
Eosinophils Absolute: 0.2 10*3/uL (ref 0.0–0.5)
LYMPH%: 25.8 % (ref 14.0–49.7)
MCHC: 32.8 g/dL (ref 31.5–36.0)
MCV: 84.1 fL (ref 79.5–101.0)
MONO%: 15.1 % — ABNORMAL HIGH (ref 0.0–14.0)
NEUT#: 4.6 10*3/uL (ref 1.5–6.5)
RBC: 4.21 10*6/uL (ref 3.70–5.45)
RDW: 14.9 % — ABNORMAL HIGH (ref 11.2–14.5)
WBC: 8.3 10*3/uL (ref 3.9–10.3)

## 2009-08-01 ENCOUNTER — Encounter: Admission: RE | Admit: 2009-08-01 | Discharge: 2009-08-01 | Payer: Self-pay | Admitting: Family Medicine

## 2009-08-01 LAB — HM MAMMOGRAPHY

## 2009-08-02 ENCOUNTER — Encounter: Payer: Self-pay | Admitting: Family Medicine

## 2009-08-03 ENCOUNTER — Encounter (INDEPENDENT_AMBULATORY_CARE_PROVIDER_SITE_OTHER): Payer: Self-pay | Admitting: *Deleted

## 2009-08-07 ENCOUNTER — Encounter (INDEPENDENT_AMBULATORY_CARE_PROVIDER_SITE_OTHER): Payer: Self-pay | Admitting: *Deleted

## 2009-08-09 ENCOUNTER — Ambulatory Visit: Payer: Self-pay | Admitting: Family Medicine

## 2009-08-09 ENCOUNTER — Ambulatory Visit: Payer: Self-pay | Admitting: Gastroenterology

## 2009-08-09 ENCOUNTER — Telehealth: Payer: Self-pay | Admitting: Family Medicine

## 2009-08-23 ENCOUNTER — Ambulatory Visit: Payer: Self-pay | Admitting: Gastroenterology

## 2009-08-23 ENCOUNTER — Telehealth: Payer: Self-pay | Admitting: Gastroenterology

## 2009-08-23 DIAGNOSIS — C189 Malignant neoplasm of colon, unspecified: Secondary | ICD-10-CM | POA: Insufficient documentation

## 2009-08-23 LAB — CONVERTED CEMR LAB
ALT: 15 units/L (ref 0–35)
Alkaline Phosphatase: 99 units/L (ref 39–117)
BUN: 6 mg/dL (ref 6–23)
Basophils Absolute: 0.1 10*3/uL (ref 0.0–0.1)
Basophils Relative: 1.5 % (ref 0.0–3.0)
CO2: 25 meq/L (ref 19–32)
Glucose, Bld: 94 mg/dL (ref 70–99)
Hemoglobin: 10.6 g/dL — ABNORMAL LOW (ref 12.0–15.0)
Lymphocytes Relative: 26.1 % (ref 12.0–46.0)
Lymphs Abs: 1.9 10*3/uL (ref 0.7–4.0)
MCHC: 32.8 g/dL (ref 30.0–36.0)
Monocytes Relative: 12.9 % — ABNORMAL HIGH (ref 3.0–12.0)
Neutro Abs: 4.2 10*3/uL (ref 1.4–7.7)
Neutrophils Relative %: 56.8 % (ref 43.0–77.0)
Platelets: 347 10*3/uL (ref 150.0–400.0)
RBC: 3.79 M/uL — ABNORMAL LOW (ref 3.87–5.11)
Total Bilirubin: 0.6 mg/dL (ref 0.3–1.2)
WBC: 7.3 10*3/uL (ref 4.5–10.5)

## 2009-08-27 ENCOUNTER — Ambulatory Visit: Payer: Self-pay | Admitting: Cardiology

## 2009-08-30 ENCOUNTER — Encounter: Payer: Self-pay | Admitting: Gastroenterology

## 2009-09-03 ENCOUNTER — Encounter: Payer: Self-pay | Admitting: Family Medicine

## 2009-09-04 ENCOUNTER — Encounter: Payer: Self-pay | Admitting: Family Medicine

## 2009-09-04 ENCOUNTER — Ambulatory Visit (HOSPITAL_COMMUNITY): Admission: RE | Admit: 2009-09-04 | Discharge: 2009-09-04 | Payer: Self-pay | Admitting: General Surgery

## 2009-09-10 HISTORY — PX: COLECTOMY: SHX59

## 2009-09-11 ENCOUNTER — Telehealth (INDEPENDENT_AMBULATORY_CARE_PROVIDER_SITE_OTHER): Payer: Self-pay | Admitting: *Deleted

## 2009-09-14 ENCOUNTER — Ambulatory Visit: Payer: Self-pay | Admitting: Family Medicine

## 2009-09-14 DIAGNOSIS — D649 Anemia, unspecified: Secondary | ICD-10-CM

## 2009-09-18 LAB — CONVERTED CEMR LAB
Basophils Absolute: 0 10*3/uL (ref 0.0–0.1)
Basophils Relative: 0.2 % (ref 0.0–3.0)
Eosinophils Absolute: 0.2 10*3/uL (ref 0.0–0.7)
Eosinophils Relative: 2.4 % (ref 0.0–5.0)
Folate: 18.9 ng/mL
Hemoglobin: 11.4 g/dL — ABNORMAL LOW (ref 12.0–15.0)
Lymphocytes Relative: 30.2 % (ref 12.0–46.0)
MCHC: 33 g/dL (ref 30.0–36.0)
Monocytes Absolute: 1.4 10*3/uL — ABNORMAL HIGH (ref 0.1–1.0)
Monocytes Relative: 17.1 % — ABNORMAL HIGH (ref 3.0–12.0)
Neutro Abs: 4.2 10*3/uL (ref 1.4–7.7)
Platelets: 324 10*3/uL (ref 150.0–400.0)

## 2009-09-28 ENCOUNTER — Ambulatory Visit: Payer: Self-pay | Admitting: Pulmonary Disease

## 2009-09-28 ENCOUNTER — Encounter (INDEPENDENT_AMBULATORY_CARE_PROVIDER_SITE_OTHER): Payer: Self-pay | Admitting: General Surgery

## 2009-09-28 ENCOUNTER — Inpatient Hospital Stay (HOSPITAL_COMMUNITY): Admission: RE | Admit: 2009-09-28 | Discharge: 2009-10-08 | Payer: Self-pay | Admitting: General Surgery

## 2009-10-18 ENCOUNTER — Encounter: Payer: Self-pay | Admitting: Gastroenterology

## 2009-10-22 ENCOUNTER — Ambulatory Visit: Payer: Self-pay | Admitting: Family Medicine

## 2009-10-24 ENCOUNTER — Ambulatory Visit: Payer: Self-pay | Admitting: Oncology

## 2009-10-24 LAB — CONVERTED CEMR LAB
Basophils Absolute: 0.1 10*3/uL (ref 0.0–0.1)
Basophils Relative: 1.3 % (ref 0.0–3.0)
CO2: 30 meq/L (ref 19–32)
Calcium: 9.1 mg/dL (ref 8.4–10.5)
Creatinine, Ser: 0.7 mg/dL (ref 0.4–1.2)
Eosinophils Absolute: 0.4 10*3/uL (ref 0.0–0.7)
Eosinophils Relative: 5.2 % — ABNORMAL HIGH (ref 0.0–5.0)
Glucose, Bld: 95 mg/dL (ref 70–99)
HCT: 31.8 % — ABNORMAL LOW (ref 36.0–46.0)
Hemoglobin: 10 g/dL — ABNORMAL LOW (ref 12.0–15.0)
MCHC: 31.4 g/dL (ref 30.0–36.0)
Neutro Abs: 3.7 10*3/uL (ref 1.4–7.7)
Neutrophils Relative %: 45.8 % (ref 43.0–77.0)
Potassium: 5 meq/L (ref 3.5–5.1)
Sodium: 143 meq/L (ref 135–145)
WBC: 8.1 10*3/uL (ref 4.5–10.5)

## 2009-10-26 ENCOUNTER — Encounter: Payer: Self-pay | Admitting: Family Medicine

## 2009-10-26 LAB — COMPREHENSIVE METABOLIC PANEL
ALT: 17 U/L (ref 0–35)
Albumin: 4.1 g/dL (ref 3.5–5.2)
BUN: 10 mg/dL (ref 6–23)
CO2: 26 mEq/L (ref 19–32)
Calcium: 9.2 mg/dL (ref 8.4–10.5)
Chloride: 105 mEq/L (ref 96–112)
Creatinine, Ser: 0.76 mg/dL (ref 0.40–1.20)
Potassium: 4.6 mEq/L (ref 3.5–5.3)

## 2009-10-26 LAB — CBC WITH DIFFERENTIAL/PLATELET
Basophils Absolute: 0 10*3/uL (ref 0.0–0.1)
HCT: 32.3 % — ABNORMAL LOW (ref 34.8–46.6)
HGB: 10.3 g/dL — ABNORMAL LOW (ref 11.6–15.9)
MONO#: 0.8 10*3/uL (ref 0.1–0.9)
NEUT#: 2.5 10*3/uL (ref 1.5–6.5)
NEUT%: 45.3 % (ref 38.4–76.8)
RDW: 16.6 % — ABNORMAL HIGH (ref 11.2–14.5)
lymph#: 1.8 10*3/uL (ref 0.9–3.3)

## 2009-10-26 LAB — LACTATE DEHYDROGENASE: LDH: 158 U/L (ref 94–250)

## 2009-11-23 ENCOUNTER — Ambulatory Visit: Payer: Self-pay | Admitting: Family Medicine

## 2009-11-23 ENCOUNTER — Ambulatory Visit: Payer: Self-pay | Admitting: Oncology

## 2009-11-26 ENCOUNTER — Encounter: Payer: Self-pay | Admitting: Family Medicine

## 2009-11-26 LAB — CBC WITH DIFFERENTIAL/PLATELET
BASO%: 1 % (ref 0.0–2.0)
HCT: 35.1 % (ref 34.8–46.6)
MCHC: 31.9 g/dL (ref 31.5–36.0)
MONO#: 1 10*3/uL — ABNORMAL HIGH (ref 0.1–0.9)
NEUT%: 42.3 % (ref 38.4–76.8)
RDW: 18.9 % — ABNORMAL HIGH (ref 11.2–14.5)
WBC: 6.1 10*3/uL (ref 3.9–10.3)
lymph#: 2.2 10*3/uL (ref 0.9–3.3)
nRBC: 0 % (ref 0–0)

## 2009-11-27 LAB — COMPREHENSIVE METABOLIC PANEL
ALT: 12 U/L (ref 0–35)
AST: 19 U/L (ref 0–37)
Albumin: 4.1 g/dL (ref 3.5–5.2)
Alkaline Phosphatase: 107 U/L (ref 39–117)
Glucose, Bld: 90 mg/dL (ref 70–99)
Potassium: 3.9 mEq/L (ref 3.5–5.3)
Sodium: 142 mEq/L (ref 135–145)
Total Protein: 5.8 g/dL — ABNORMAL LOW (ref 6.0–8.3)

## 2009-11-27 LAB — CONVERTED CEMR LAB
Lymphocytes Relative: 40 % (ref 12–46)
MCV: 80.9 fL (ref 78.0–100.0)
Monocytes Relative: 16 % — ABNORMAL HIGH (ref 3–12)
Neutrophils Relative %: 41 % — ABNORMAL LOW (ref 43–77)
Platelets: 361 10*3/uL (ref 150–400)
RBC: 4.34 M/uL (ref 3.87–5.11)

## 2009-11-27 LAB — IRON AND TIBC
%SAT: 11 % — ABNORMAL LOW (ref 20–55)
TIBC: 369 ug/dL (ref 250–470)

## 2009-11-27 LAB — TRANSFERRIN RECEPTOR, SOLUABLE: Transferrin Receptor, Soluble: 21.8 nmol/L

## 2009-11-27 LAB — LACTATE DEHYDROGENASE: LDH: 142 U/L (ref 94–250)

## 2009-12-03 ENCOUNTER — Telehealth: Payer: Self-pay | Admitting: Family Medicine

## 2010-01-07 ENCOUNTER — Telehealth: Payer: Self-pay | Admitting: Family Medicine

## 2010-01-22 ENCOUNTER — Ambulatory Visit: Payer: Self-pay | Admitting: Oncology

## 2010-01-24 ENCOUNTER — Encounter: Payer: Self-pay | Admitting: Family Medicine

## 2010-01-24 LAB — CBC WITH DIFFERENTIAL/PLATELET
Basophils Absolute: 0 10*3/uL (ref 0.0–0.1)
EOS%: 4.9 % (ref 0.0–7.0)
HGB: 12.9 g/dL (ref 11.6–15.9)
LYMPH%: 39.2 % (ref 14.0–49.7)
MCH: 28 pg (ref 25.1–34.0)
MCV: 85.9 fL (ref 79.5–101.0)
MONO%: 19.9 % — ABNORMAL HIGH (ref 0.0–14.0)
Platelets: 237 10*3/uL (ref 145–400)
RBC: 4.6 10*6/uL (ref 3.70–5.45)
RDW: 19.4 % — ABNORMAL HIGH (ref 11.2–14.5)

## 2010-01-28 LAB — COMPREHENSIVE METABOLIC PANEL
Alkaline Phosphatase: 95 U/L (ref 39–117)
CO2: 29 mEq/L (ref 19–32)
Creatinine, Ser: 0.71 mg/dL (ref 0.40–1.20)
Glucose, Bld: 97 mg/dL (ref 70–99)
Sodium: 140 mEq/L (ref 135–145)
Total Bilirubin: 0.3 mg/dL (ref 0.3–1.2)
Total Protein: 6 g/dL (ref 6.0–8.3)

## 2010-01-28 LAB — IRON AND TIBC
%SAT: 13 % — ABNORMAL LOW (ref 20–55)
Iron: 47 ug/dL (ref 42–145)
TIBC: 349 ug/dL (ref 250–470)
UIBC: 302 ug/dL

## 2010-01-28 LAB — FERRITIN: Ferritin: 19 ng/mL (ref 10–291)

## 2010-01-28 LAB — CEA: CEA: 1.1 ng/mL (ref 0.0–5.0)

## 2010-02-22 ENCOUNTER — Ambulatory Visit: Payer: Self-pay | Admitting: Oncology

## 2010-02-26 ENCOUNTER — Encounter: Payer: Self-pay | Admitting: Family Medicine

## 2010-02-26 LAB — FECAL OCCULT BLOOD, GUAIAC: Occult Blood: NEGATIVE

## 2010-02-27 ENCOUNTER — Ambulatory Visit
Admission: RE | Admit: 2010-02-27 | Discharge: 2010-02-27 | Payer: Self-pay | Source: Home / Self Care | Attending: Family Medicine | Admitting: Family Medicine

## 2010-02-27 LAB — CONVERTED CEMR LAB: KOH Prep: NEGATIVE

## 2010-03-02 ENCOUNTER — Encounter (HOSPITAL_COMMUNITY): Payer: Self-pay | Admitting: Oncology

## 2010-03-06 ENCOUNTER — Telehealth: Payer: Self-pay | Admitting: Family Medicine

## 2010-03-10 LAB — CONVERTED CEMR LAB
ALT: 27 units/L (ref 0–35)
AST: 32 units/L (ref 0–37)
Alkaline Phosphatase: 70 units/L (ref 39–117)
Bilirubin, Direct: 0.1 mg/dL (ref 0.0–0.3)
Calcium, Total (PTH): 9.3 mg/dL (ref 8.4–10.5)
Calcium: 9 mg/dL (ref 8.4–10.5)
Cholesterol: 184 mg/dL (ref 0–200)
Eosinophils Relative: 2.9 % (ref 0.0–5.0)
Folate: 19.9 ng/mL
GFR calc Af Amer: 92 mL/min
GFR calc non Af Amer: 76 mL/min
Glucose, Bld: 96 mg/dL (ref 70–99)
HDL: 46.7 mg/dL (ref >39.0)
MCHC: 33.7 g/dL (ref 30.0–36.0)
MCV: 95.1 fL (ref 78.0–100.0)
Monocytes Absolute: 1.1 10*3/uL — ABNORMAL HIGH (ref 0.1–1.0)
Neutro Abs: 4.6 10*3/uL (ref 1.4–7.7)
Potassium: 3.9 meq/L (ref 3.5–5.1)
RBC: 4.31 M/uL (ref 3.87–5.11)
RDW: 12.6 % (ref 11.5–14.6)
Total Bilirubin: 0.6 mg/dL (ref 0.3–1.2)
Total CHOL/HDL Ratio: 3.9
Total Protein: 6.6 g/dL (ref 6.0–8.3)
Triglycerides: 156 mg/dL — ABNORMAL HIGH (ref 0–149)
Vit D, 1,25-Dihydroxy: 25 — ABNORMAL LOW (ref 30–89)
Vitamin B-12: 1020 pg/mL — ABNORMAL HIGH (ref 211–911)

## 2010-03-12 NOTE — Progress Notes (Signed)
  Phone Note Call from Patient   Caller: Patient Summary of Call: Patient stopped by in person to ask you if you would get her a second opinion at [facility]. She saw Dr Bud Face who is a new EnT Dr and she would really like to see another one in [place]. I have called [facility] to get the most recent note from 08/01/2009 when she saw him last. They will fax it to me soon. Pls advise if ok to get 2nd opinion. Told patient this had to come from you. Will bring note back and put it on your desk when it comes. [place]  Initial call taken by: Carlton Adam,  August 09, 2009 10:43 AM  Follow-up for Phone Call        will do referral Follow-up by: Judith Part MD,  August 09, 2009 1:06 PM

## 2010-03-12 NOTE — Letter (Signed)
Summary: [facility]   Imported By: Lanelle Bal 12/06/2009 14:09:21  _____________________________________________________________________  External Attachment:    Type:   Image     Comment:   External Document

## 2010-03-12 NOTE — Letter (Signed)
Summary: [facility]   Imported By: Lanelle Bal 05/26/2009 09:07:01  _____________________________________________________________________  External Attachment:    Type:   Image     Comment:   External Document

## 2010-03-12 NOTE — Letter (Signed)
Summary: Previsit letter  [facility]  [address] [place], [place] [address]   Phone: [phone]  Fax: [phone]       07/18/2009 MRN: [id number]  Diamond Boyd [address] [place], [place]  [address]  Dear [name],  Welcome to the Gastroenterology Division at [facility].    You are scheduled to see a nurse for your pre-procedure visit on  08-09-09 at 1pm on the 3rd floor at [facility], [address]  We ask that you try to arrive at our office 15 minutes prior to your appointment time to allow for check-in.  Your nurse visit will consist of discussing your medical and surgical history, your immediate family medical history, and your medications.    Please bring a complete list of all your medications or, if you prefer, bring the medication bottles and we will list them.  We will need to be aware of both prescribed and over the counter drugs.  We will need to know exact dosage information as well.  If you are on blood thinners (Coumadin, Plavix, Aggrenox, Ticlid, etc.) please call our office today/prior to your appointment, as we need to consult with your physician about holding your medication.   Please be prepared to read and sign documents such as consent forms, a financial agreement, and acknowledgement forms.  If necessary, and with your consent, a friend or relative is welcome to sit-in on the nurse visit with you.  Please bring your insurance card so that we may make a copy of it.  If your insurance requires a referral to see a specialist, please bring your referral form from your primary care physician.  No co-pay is required for this nurse visit.     If you cannot keep your appointment, please call [phone] to cancel or reschedule prior to your appointment date.  This allows [place] the opportunity to schedule an appointment for another patient in need of care.    Thank you for choosing [facility] for your medical needs.  We  appreciate the opportunity to care for you.  Please visit [place] at our website  to learn more about our practice.                     Sincerely.                                                                                                                   The Gastroenterology Division

## 2010-03-12 NOTE — Letter (Signed)
Summary: [facility]  [facility]   Imported By: Lanelle Bal 08/22/2009 13:55:02  _____________________________________________________________________  External Attachment:    Type:   Image     Comment:   External Document

## 2010-03-12 NOTE — Letter (Signed)
Summary: [facility]  [facility]   Imported By: Maryln Gottron 08/15/2009 15:21:00  _____________________________________________________________________  External Attachment:    Type:   Image     Comment:   External Document

## 2010-03-12 NOTE — Consult Note (Signed)
Summary: [facility]   Imported By: Sherian Rein 09/14/2009 11:10:03  _____________________________________________________________________  External Attachment:    Type:   Image     Comment:   External Document

## 2010-03-12 NOTE — Progress Notes (Signed)
Summary: Labs/CT/Surgical Consult  Phone Note Outgoing Call Call back at Home Phone [phone]   Call placed by: Laureen Ochs LPN,  August 23, 2009 12:21 PM Call placed to: Patient Summary of Call: Per Dr.Kaplan/Colon report from today, pt.is scheduled for  labs today, CT Abd/Pelvis at [facility] on 08-27-09 at 1:30pm and an appt. w/Dr.Ingram on 08-30-09 arriving at 12:30PM.   I will call pt. in the morning to review appt. information with her. Initial call taken by: Laureen Ochs LPN,  August 23, 2009 12:22 PM  Follow-up for Phone Call        Above appt. information reviewed with pt. and her husband by phone. Pt. instructed to call back as needed.  Follow-up by: Laureen Ochs LPN,  August 24, 2009 9:04 AM

## 2010-03-12 NOTE — Letter (Signed)
Summary: [facility]  [facility]   Imported By: Maryln Gottron 11/12/2009 15:45:57  _____________________________________________________________________  External Attachment:    Type:   Image     Comment:   External Document

## 2010-03-12 NOTE — Progress Notes (Signed)
Summary: needs refill on iron  Phone Note Refill Request Message from:  Fax from Pharmacy  Refills Requested: Medication #1:  NU-IRON 150 MG CAPS one capsule by mouth once daily.. Pt states that her oncologist has increased this to two a day.  [facility] [address]  Initial call taken by: Lowella Petties CMA, AAMA,  December 03, 2009 2:41 PM  Follow-up for Phone Call        px written on EMR for call in  Follow-up by: Judith Part MD,  December 03, 2009 3:34 PM  Additional Follow-up for Phone Call Additional follow up Details #1::        [facility] as instructed. Lewanda Rife LPN  December 03, 2009 4:45 PM     New/Updated Medications: NU-IRON 150 MG CAPS (POLYSACCHARIDE IRON COMPLEX) one capsule by mouth two times a day Prescriptions: NU-IRON 150 MG CAPS (POLYSACCHARIDE IRON COMPLEX) one capsule by mouth two times a day  #60 x 5   Entered and Authorized by:   Judith Part MD   Signed by:   Lewanda Rife LPN on 82/95/6213   Method used:   Telephoned to ...       [facility] [phone]* (retail)       [address] [place], [place]  [address]       Ph: [phone] or [phone]       Fax: [phone]   RxID:   [id number]

## 2010-03-12 NOTE — Assessment & Plan Note (Signed)
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 -- though to be poss obesity hypoventilation also some mild pulmonary edema  second complication was brief MS change in ICU  CT head was nl  seen by neurol Dr Sharene Skeans and thought to be brief metabolic encephalopathy-- it reversed itself quickly  is making a slow recovery -- using miralax and really no pain   allergies are terrible -- and has not had her shots lately  is stopped up and headaches and green nasal discharge  symptoms for over a week  no fever  is chilled a lot   got pneumovax in hospital   has not had chemo or radiation -- will see what Dr Arline Asp decides to do       Allergies: 1)  ! * Achromycin 2)  ! Allopurinol 3)  ! Cephalexin 4)  ! * Telfa Pads 5)  ! * Minocycline 6)  Relafen 7)  Penicillin 8)  Sulfa 9)  * Astrospray 10)  Codeine 11)  Keflex 12)  * Mobic 13)  * Ambien 14)  Cipro  Past History:  Past Medical History: Last updated: 09/10/2009 Skin cancer, hx of- basal cell on arm Colonic  polyps, hx of Depression Hypothyroidism Osteoarthritis- hands Peripheral vascular disease LS deg disk dz (? spine center in past) lymphoma Cough.....................................................................Marland KitchenWert    - onset 09/2008 7/11 colon cancer   oncol- DR Murinson derm [facility] Arlyce Dice Sx [facility] all  [facility]   Past Surgical History: Last updated: 09/30/2009 Cholecystectomy Hysterectomy for ? uterine prolapse- not cancer (partial) Splenectomy- lymphoma  Breast biopsy (1996) Plantar fascitis release bilaterally Ventral hernia repair (1998) ? tendon release on R thumb- past Colonoscopy- neg (04/1999) ,  polyps (05/2004) Dexa- osteopenia (03/2000) ,  no change (01/2004) Admit- SOB/ fever (01/2001) Flex sig- neg. 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 Eyes:  Denies blurring and  eye irritation. ENT:  Complains of nasal congestion, postnasal drainage, and sore throat. CV:  Denies chest pain or discomfort, lightheadness, and palpitations. Resp:  Denies cough, shortness of breath, and wheezing. GI:  Denies abdominal pain, change in bowel habits, indigestion, nausea, and vomiting. GU:  Denies dysuria and urinary frequency. MS:  Denies muscle aches and cramps. Derm:  Denies itching, lesion(s), poor wound healing, and rash. Neuro:  Denies numbness and tingling. Psych:  mood is generally ok . Endo:  Denies cold  intolerance, excessive thirst, excessive urination, and heat intolerance. Heme:  Denies abnormal bruising and bleeding. Allergy:  Complains of persistent infections, seasonal allergies, and sneezing.  Physical Exam  General:  overweight but generally well appearing  Head:  normocephalic, atraumatic, and no abnormalities observed.  tender R maxillary sinus  Eyes:  vision grossly intact, pupils equal, pupils round, and pupils reactive to light.  very slt conj pallor  Ears:  R ear normal and L ear normal.   Nose:  nares are injected and congested bilaterally  Mouth:  pharynx pink and moist.   Neck:  supple with full rom and no masses or thyromegally, no JVD or carotid bruit  Chest Wall:  No deformities, masses, or tenderness noted. Lungs:  Normal respiratory effort, chest expands symmetrically. Lungs are clear to auscultation, no crackles or wheezes. Heart:  Normal rate and regular rhythm. S1 and S2 normal without gallop, murmur, click, rub or other extra sounds. Abdomen:  midline scar with dressing nontender nl bs in 4 Q Msk:  No deformity or scoliosis noted of thoracic or lumbar spine.  no acute joint changes Pulses:  R and L carotid,radial,femoral,dorsalis pedis and posterior tibial pulses are full and equal bilaterally Extremities:  edema controlled with support hose today Neurologic:  sensation intact to light touch, gait normal, and DTRs symmetrical and normal.   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 positive lymph nodes   Problem # 2:  UNSPECIFIED ANEMIA (ICD-285.9) Assessment: Improved re check this in light of mild pallor and also transfusion in hospital  cbc today pt is currently not on iron Orders: Venipuncture (04540) TLB-Renal Function Panel (80069-RENAL) TLB-CBC Platelet - w/Differential (85025-CBCD)  Problem # 3:  OTHER CHRONIC SINUSITIS (ICD-473.8) Assessment: Deteriorated recurrent sinusitis after bout of allergies tx with zithromax (pt has intol to most abx) update if not imp recommend sympt care- see pt instructions   pt advised to update me if symptoms worsen or do not improve  Her updated medication list for this problem includes:    Flonase Susp (Fluticasone propionate susp) .Marland Kitchen... 1 spray in each nostril twice daily    Zithromax Z-pak 250 Mg Tabs (Azithromycin) .Marland Kitchen... Take by mouth as directed for sinus infection  Problem # 4:  Hx of LEG EDEMA (ICD-782.3) Assessment: Improved pt had edema and even some pulm edema in hosp after iv fluids and transfusion  this is resolved  check renal panel today  suspect straightfoward fluid overload- no cardiac symptoms or red flags Orders:  Venipuncture (78295) TLB-Renal Function Panel (80069-RENAL) TLB-CBC Platelet - w/Differential (85025-CBCD)  Problem # 5:  Preventive Health Care (ICD-V70.0) Assessment: Comment Only flu shot today pt had pneumovax in hosp (technically did not need it )   Problem # 6:  LYMPHOMA NEC, MLIG, 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 of pos retroperitoneal LN from recent surgery  unsure - pt was not aware  she will disc with Dr Arline Asp on friday  Complete Medication List: 1)  Synthroid 112 Mcg Tabs (Levothyroxine sodium) .... Take one by mouth daily 2)  Prozac 20 Mg Caps (Fluoxetine hcl) .Marland Kitchen.. 1 by mouth two times a day 3)  Estrace 2 Mg Tabs (Estradiol) .... Take one half by mouth twice a day 4)  Allegra 180 Mg Tabs (Fexofenadine  hcl) .... Take one by mouth daily 5)  Alllergy Injections  .... Injection q week 6)  Flonase Susp (Fluticasone propionate susp) .Marland Kitchen.. 1 spray in each nostril twice daily 7)  Zocor 40 Mg Tabs (Simvastatin) .Marland Kitchen.. 1 by mouth once daily 8)  Prosed Ds  .Marland KitchenMarland Kitchen. 1 by mouth up to 4 times daily as needed 9)  Vitamin D 1000 Unit Tabs (Cholecalciferol) .... Take 1 tablet by mouth once a day 10)  Prilosec 20 Mg Cpdr (Omeprazole) .... Take one 30-60 min before first and last meals of the day as long as coughing at all, then once daily before breakfast 11)  Vitamin E 400mg   .... Takes one tablet daily 12)  Omega-3 350 Mg Caps (Omega-3 fatty acids) .... Take 1 capsule by mouth once a day 13)  Glucosamine-chondroitin Caps (Glucosamine-chondroit-vit c-mn) .... Take 1 capsule by mouth once a day 14)  Zithromax Z-pak 250 Mg Tabs (Azithromycin) .... Take by mouth as directed for sinus infection  Other Orders: Flu Vaccine 76yrs + MEDICARE PATIENTS (A2130) Administration Flu vaccine - MCR (Q6578)  Patient Instructions: 1)  labs today including blood count  2)  flu shot today 3)  take the zithromax for early sinus infection  4)  you can try plain mucinex for the congestion  5)  update me if that does not improve  Prescriptions: ZITHROMAX Z-PAK 250 MG TABS (AZITHROMYCIN) take by mouth as directed for sinus infection  #1 pack x 0   Entered and Authorized by:   Judith Part MD   Signed by:   Judith Part MD on 10/22/2009   Method used:   [facility] to        [facility] [phone]* (retail)       [address] [place], [place]  [address]       Ph: [phone] or [phone]       Fax: [phone]   RxID:   [id number]   Current Allergies (reviewed today): ! * ACHROMYCIN ! ALLOPURINOL ! CEPHALEXIN ! * TELFA PADS ! * MINOCYCLINE RELAFEN PENICILLIN SULFA * ASTROSPRAY CODEINE KEFLEX * MOBIC * AMBIEN CIPRO   Pneumovax Immunization History:    Pneumovax # 1:   Pneumovax (10/08/2009)    Flu Vaccine Consent Questions     Do you have a history of severe allergic reactions to this vaccine? no    Any prior history of allergic reactions to egg and/or gelatin? no    Do you have a sensitivity to the preservative Thimersol? no    Do you have a past history of Guillan-Barre Syndrome? no  Do you currently have an acute febrile illness? no    Have you ever had a severe reaction to latex? no    Vaccine information given and explained to patient? yes    Are you currently pregnant? no    Lot Number:[id number]   Exp Date:08/10/2010   Site Given  Left Deltoid IMedflu Lewanda Rife LPN  October 22, 2009 4:21 PM

## 2010-03-12 NOTE — Miscellaneous (Signed)
  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

## 2010-03-12 NOTE — Letter (Signed)
Summary: [facility]  [facility]   Imported By: Lanelle Bal 08/03/2009 10:40:19  _____________________________________________________________________  External Attachment:    Type:   Image     Comment:   External Document

## 2010-03-12 NOTE — Assessment & Plan Note (Signed)
Summary: COLD/RBH   Vital Signs:  Patient profile:   70 year old female Weight:      233 pounds Temp:     98 degrees F oral Pulse rate:   84 / minute Pulse rhythm:   regular BP sitting:   130 / 70  (left arm) Cuff size:   large  Vitals Entered By: Lowella Petties CMA (March 08, 2009 12:08 PM) CC: Sinus drainage, congestion, headaches.   History of Present Illness: 3 days of bad cold symptoms  headache and severe cough aches into her legs - down into the bone  does not have fever ?   has not taken her temperature -- but feet are cold , but no chills  did break out in a sweat -- today throat is raw from drainage  ears are itching   all clear nasal d/c  cough is dry and non productive  no wheezing   no flu contacts   no meds at all otc except delsym  Allergies: 1)  ! * Achromycin 2)  ! Allopurinol 3)  ! Cephalexin 4)  ! * Telfa Pads 5)  ! * Minocycline 6)  Relafen 7)  Penicillin 8)  Sulfa 9)  * Astrospray 10)  Codeine 11)  Keflex 12)  * Mobic 13)  * Ambien 14)  Cipro  Review of Systems General:  Complains of fatigue; denies chills, fever, and loss of appetite. Eyes:  Complains of eye irritation; denies discharge. ENT:  Complains of hoarseness, nasal congestion, postnasal drainage, and sore throat; denies earache. CV:  Denies chest pain or discomfort and palpitations. Resp:  Complains of cough and sputum productive; denies pleuritic, shortness of breath, and wheezing. GI:  Denies abdominal pain, change in bowel habits, nausea, and vomiting. Derm:  Denies itching, lesion(s), poor wound healing, and rash.  Physical Exam  General:  overweight but generally well appearing  Head:  normocephalic, atraumatic, and no abnormalities observed.  mild tenderness of maxillary sinuses  Eyes:  vision grossly intact, pupils equal, pupils round, pupils reactive to light, and no injection.   Ears:  R ear normal and L ear normal.   Nose:  nares are congested and injected    Mouth:  pharynx pink and moist and no erythema.   some clear post nasal drip  Neck:  supple with full rom and no masses or thyromegally, no JVD or carotid bruit  Lungs:  CTA with slt harsh bs at bases no rales/rhonchi/ wheeze or sob  Heart:  Normal rate and regular rhythm. S1 and S2 normal without gallop, murmur, click, rub or other extra sounds. Skin:  Intact without suspicious lesions or rashes Cervical Nodes:  No lymphadenopathy noted Psych:  normal affect, talkative and pleasant    Impression & Recommendations:  Problem # 1:  URI (ICD-465.9) Assessment New viral uri with head and chest symptoms  recommend sympt care- see pt instructions  -- also tussionex as needed severe cough (warned of sedation) fluids / rest and update if not imp in several days or if high fever or sob Her updated medication list for this problem includes:    Allegra 180 Mg Tabs (Fexofenadine hcl) .Marland Kitchen... Take one by mouth daily    Tussionex Pennkinetic Er 8-10 Mg/62ml Lqcr (Chlorpheniramine-hydrocodone) .Marland Kitchen... 1/2 to 1 teaspoon up to two times a day as needed severe cough  warning - this will sedate  Complete Medication List: 1)  Synthroid 112 Mcg Tabs (Levothyroxine sodium) .... Take one by mouth daily 2)  Prozac  20 Mg Caps (Fluoxetine hcl) .Marland Kitchen.. 1 by mouth two times a day 3)  Estrace 2 Mg Tabs (Estradiol) .... Take one half by mouth bid 4)  Allegra 180 Mg Tabs (Fexofenadine hcl) .... Take one by mouth daily 5)  Alllergy Injections  .... Injection q week 6)  Flonase Susp (Fluticasone propionate susp) .Marland Kitchen.. 1 spray in each nostril twice daily 7)  Zocor 40 Mg Tabs (Simvastatin) .Marland Kitchen.. 1 by mouth once daily 8)  Prosed Ds  .Marland KitchenMarland Kitchen. 1 by mouth up to 4 times daily as needed 9)  Vitamin D 1000 Unit Tabs (Cholecalciferol) .... Take 2 tablet by mouth once a day 10)  Prilosec 20 Mg Cpdr (Omeprazole) .... Take one 30-60 min before first and last meals of the day as long as coughing at all, then once daily before breakfast 11)   Tussionex Pennkinetic Er 8-10 Mg/63ml Lqcr (Chlorpheniramine-hydrocodone) .... 1/2 to 1 teaspoon up to two times a day as needed severe cough  warning - this will sedate  Patient Instructions: 1)  get mucinex (plain ) over the counter - take it two times a day as directed with lots of water for congestion 2)  use tylenol as directed for fever/ headahe or aches  3)  take tussionex with caution for severe cough- careful it it very sedating  4)  get lots of rest and fluids 5)  update me if high fever or no improvement by next week  Prescriptions: TUSSIONEX PENNKINETIC ER 8-10 MG/5ML LQCR (CHLORPHENIRAMINE-HYDROCODONE) 1/2 to 1 teaspoon up to two times a day as needed severe cough  warning - this will sedate  #8oz x 0   Entered and Authorized by:   Judith Part MD   Signed by:   Judith Part MD on 03/08/2009   Method used:   Print then Give to Patient   RxID:   [id number]

## 2010-03-12 NOTE — Letter (Signed)
Summary: [facility] Instructions  [facility]  [address] [place], [place] [address]   Phone: [phone]  Fax: [phone]       Diamond Boyd    [birth year]    MRN: [id number]        Procedure Day Dorna Bloom:  Lenor Coffin  08/23/09     Arrival Time:  9:00AM     Procedure Time:  10:00AM     Location of Procedure:                    _ X_  [facility] (4th Floor)                        PREPARATION FOR COLONOSCOPY WITH MOVIPREP   Starting 5 days prior to your procedure 08/18/09 do not eat nuts, seeds, popcorn, corn, beans, peas,  salads, or any raw vegetables.  Do not take any fiber supplements (e.g. Metamucil, Citrucel, and Benefiber).  THE DAY BEFORE YOUR PROCEDURE         DATE: 08/22/09  DAY: WEDNESDAY  1.  Drink clear liquids the entire day-NO SOLID FOOD  2.  Do not drink anything colored red or purple.  Avoid juices with pulp.  No orange juice.  3.  Drink at least 64 oz. (8 glasses) of fluid/clear liquids during the day to prevent dehydration and help the prep work efficiently.  CLEAR LIQUIDS INCLUDE: Water Jello Ice Popsicles Tea (sugar ok, no milk/cream) Powdered fruit flavored drinks Coffee (sugar ok, no milk/cream) Gatorade Juice: apple, white grape, white cranberry  Lemonade Clear bullion, consomm, broth Carbonated beverages (any kind) Strained chicken noodle soup Hard Candy                             4.  In the morning, mix first dose of MoviPrep solution:    Empty 1 Pouch A and 1 Pouch B into the disposable container    Add lukewarm drinking water to the top line of the container. Mix to dissolve    Refrigerate (mixed solution should be used within 24 hrs)  5.  Begin drinking the prep at 5:00 p.m. The MoviPrep container is divided by 4 marks.   Every 15 minutes drink the solution down to the next mark (approximately 8 oz) until the full liter is complete.   6.  Follow completed prep with 16 oz of clear liquid of your choice  (Nothing red or purple).  Continue to drink clear liquids until bedtime.  7.  Before going to bed, mix second dose of MoviPrep solution:    Empty 1 Pouch A and 1 Pouch B into the disposable container    Add lukewarm drinking water to the top line of the container. Mix to dissolve    Refrigerate  THE DAY OF YOUR PROCEDURE      DATE: 08/23/09  DAY: THURSDAY  Beginning at 5:00AM (5 hours before procedure):         1. Every 15 minutes, drink the solution down to the next mark (approx 8 oz) until the full liter is complete.  2. Follow completed prep with 16 oz. of clear liquid of your choice.    3. You may drink clear liquids until 8:00AM (2 HOURS BEFORE PROCEDURE).   MEDICATION INSTRUCTIONS  Unless otherwise instructed, you should take regular prescription medications with a small sip of water   as early as possible the morning  of your procedure.           OTHER INSTRUCTIONS  You will need a responsible adult at least 71 years of age to accompany you and drive you home.   This person must remain in the waiting room during your procedure.  Wear loose fitting clothing that is easily removed.  Leave jewelry and other valuables at home.  However, you may wish to bring a book to read or  an iPod/MP3 player to listen to music as you wait for your procedure to start.  Remove all body piercing jewelry and leave at home.  Total time from sign-in until discharge is approximately 2-3 hours.  You should go home directly after your procedure and rest.  You can resume normal activities the  day after your procedure.  The day of your procedure you should not:   Drive   Make legal decisions   Operate machinery   Drink alcohol   Return to work  You will receive specific instructions about eating, activities and medications before you leave.    The above instructions have been reviewed and explained to me by   Karl Bales RN  August 09, 2009 1:24 PM    I fully  understand and can verbalize these instructions _____________________________ Date _________

## 2010-03-12 NOTE — Progress Notes (Signed)
Summary: yeast infection   Phone Note Call from Patient Call back at Home Phone [phone]   Caller: Patient Call For: Judith Part MD Summary of Call: Patient statest that she has a yeast infection. She has alot of burning and itching in her vaginal area. She has tried OTC products and it is not helping. She is asking if she could get something called in. [facility] [address]  Initial call taken by: Melody Comas,  January 07, 2010 1:44 PM  Follow-up for Phone Call        will try diflucan -- follow up if not improved  px written on EMR for call in stop her zocor for 3 days while taking this med- they can interact  Follow-up by: Judith Part MD,  January 07, 2010 2:13 PM  Additional Follow-up for Phone Call Additional follow up Details #1::        Patient's daughter notified as instructed by telephone. Pt to stop Zocor for 3 days when pt takes the Diflucan. (Pt and pt's husband not at home and daughter was supposed to take message.) [facility] as instructed. Lewanda Rife LPN  January 07, 2010 4:49 PM     New/Updated Medications: DIFLUCAN 150 MG TABS (FLUCONAZOLE) 1 by mouth once for yeast infection Prescriptions: DIFLUCAN 150 MG TABS (FLUCONAZOLE) 1 by mouth once for yeast infection  #1 x 0   Entered and Authorized by:   Judith Part MD   Signed by:   Lewanda Rife LPN on 14/78/2956   Method used:   Telephoned to ...       [facility] [phone]* (retail)       [address] [place], [place]  [address]       Ph: [phone] or [phone]       Fax: [phone]   RxID:   [id number]

## 2010-03-12 NOTE — Assessment & Plan Note (Signed)
Summary: CHECK UP/CLE   Vital Signs:  Patient profile:   70 year old female Height:      65 inches Weight:      228.75 pounds BMI:     38.20 Temp:     97.9 degrees F oral Pulse rate:   72 / minute Pulse rhythm:   regular BP sitting:   122 / 72  (left arm) Cuff size:   large  Vitals Entered By: Lewanda Rife LPN (July 18, 1608 10:52 AM) CC: CPX LMP partial hyst 1974   History of Present Illness: here for health mt exam   wt is down 2 lb   bp 122/ 72  hypothyroid - due for check   colon polyps - due for 5 y colonosc this mo  has a knot in rectum - ? hemorroid  needs to make appt for colonosc   sched ortho for back and leg pain-- has upcoming f/u with ortho  bad discs in back    partial hyst in past -- nl pap 6/10  osteopenia in past - then nl dexa 11/10- good  ca and D  mam 6/10 self exam no lumps or problems   saw Dr Andee Poles - for sore throat  thought there is something going on with her tonsil  then he talked to oncologist -- and see oncol and ENT this mo      Allergies: 1)  ! * Achromycin 2)  ! Allopurinol 3)  ! Cephalexin 4)  ! * Telfa Pads 5)  ! 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 spine center in past) lymphoma Cough.....................................................................Marland KitchenWert    - onset 09/2008   oncol- DR Murinson [facility] S[facility] all  [facility]   Past Surgical History: Last updated: 07/17/2008 Cholecystectomy Hysterectomy for ? uterine prolapse- not cancer (partial) Splenectomy- lymphoma  Breast biopsy (1996) Plantar fascitis release bilaterally Ventral hernia repair (1998) ? tendon  release on R thumb- past Colonoscopy- neg (04/1999) ,  polyps (05/2004) Dexa- osteopenia (03/2000) ,  no change (01/2004) Admit- SOB/ fever (01/2001) Flex sig- neg. 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 Eyes:  Denies blurring and eye pain. ENT:  Complains of sore throat; denies sinus pressure. CV:  Denies chest pain or discomfort, palpitations, shortness of breath with exertion, and swelling of feet. Resp:  Denies cough, shortness of breath, sputum productive, and wheezing. GI:  Denies abdominal pain, bloody stools, change in bowel habits, indigestion, and nausea. GU:  Denies abnormal vaginal bleeding, discharge, dysuria, and urinary frequency. MS:  Complains of low back pain; denies joint pain, joint swelling, cramps, and stiffness. Derm:  Denies itching, lesion(s), poor wound healing, and rash. Neuro:  Denies headaches, numbness, and tingling.  Psych:  Complains of irritability; denies anxiety and depression. Endo:  Denies cold intolerance, excessive thirst, excessive urination, and heat intolerance. Heme:  Denies abnormal bruising and bleeding.  Physical Exam  General:  overweight but generally well appearing  Head:  normocephalic, atraumatic, and no abnormalities observed.   Eyes:  vision grossly intact, pupils equal, pupils round, and pupils reactive to light.  no conjunctival pallor, injection or icterus  Ears:  R ear normal and L ear normal.   Nose:  no nasal discharge.    Mouth:  pharynx pink and moist.   Neck:  supple with full rom and no masses or thyromegally, no JVD or carotid bruit  Chest Wall:  No deformities, masses, or tenderness noted. Breasts:  No mass, nodules, thickening, tenderness, bulging, retraction, inflamation, nipple discharge or skin changes noted.   Lungs:  Normal respiratory effort, chest expands symmetrically. Lungs are clear to auscultation, no crackles or wheezes. Heart:  Normal rate and regular rhythm. S1 and S2 normal without gallop, murmur, click, rub or other extra sounds. Abdomen:  Bowel sounds positive,abdomen soft and non-tender without masses, organomegaly or hernias noted. no renal bruits  Msk:  No deformity or scoliosis noted of thoracic or lumbar spine.  poor rom LS  Pulses:  R and L carotid,radial,femoral,dorsalis pedis and posterior tibial pulses are full and equal bilaterally Extremities:  edema controlled with support hose today Neurologic:  sensation intact to light touch, gait normal, and DTRs symmetrical and normal.   Skin:  Intact without suspicious lesions or rashes Cervical Nodes:  No lymphadenopathy noted Axillary Nodes:  No palpable lymphadenopathy Inguinal Nodes:  No significant adenopathy Psych:  normal affect, talkative and pleasant    Impression & Recommendations:  Problem # 1:  OTHER SCREENING MAMMOGRAM (ICD-V76.12) Assessment Comment Only annual mammogram scheduled adv pt to continue regular self breast exams non remarkable breast exam today  Orders: Radiology Referral (Radiology)  Problem # 2:  THROAT PAIN, CHRONIC (ICD-784.1) Assessment: Unchanged adv to f/u with ENT as planned   Problem # 3:  GERD (ICD-530.81) Assessment: Unchanged on two times a day PPI - in light of chronic cough also  refil med good control  Her updated medication list for this problem includes:    Prilosec 20 Mg Cpdr (Omeprazole) .Marland Kitchen... Take one 30-60 min before first and last meals of the day as long as coughing at  all, then once daily before breakfast  Problem # 4:  OSTEOPENIA (ICD-733.90) Assessment: Improved  last dexa good  disc ca and D check D level today Her updated medication list for this problem includes:    Vitamin D 1000 Unit Tabs (Cholecalciferol) .Marland Kitchen... Take 1 tablet by mouth once a day  Orders: Venipuncture ([address]) TLB-Lipid Panel (80061-LIPID) TLB-Renal Function Panel (80069-RENAL) TLB-Hepatic/Liver Function Pnl (80076-HEPATIC) TLB-TSH (Thyroid Stimulating Hormone) (84443-TSH) T-Vitamin D (25-Hydroxy) (60454-09811)  Bone Density: normal (12/29/2008) Bone Density-LS: -.44 (12/22/2008) Vit D:32 (01/10/2009), 38 (08/10/2007)  Problem # 5:  HYPERCHOLESTEROLEMIA, PURE (ICD-272.0) Assessment: Unchanged  labs today on statin and diet rev low sat fat diet  Her updated medication list for this problem includes:    Zocor 40 Mg Tabs (Simvastatin) .Marland Kitchen... 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 Take one by mouth daily  Orders: Venipuncture (19147) TLB-Lipid Panel (80061-LIPID) TLB-Renal Function Panel  (80069-RENAL) TLB-Hepatic/Liver Function Pnl (80076-HEPATIC) TLB-TSH (Thyroid Stimulating Hormone) (84443-TSH) T-Vitamin D (25-Hydroxy) (82956-21308)  Problem # 8:  COLONIC POLYPS, HX OF (ICD-V12.72) due for 5 y colonosc -- will sched  Orders: Gastroenterology Referral (GI)  Complete Medication List: 1)  Synthroid 112 Mcg Tabs (Levothyroxine sodium) .... Take one by mouth daily 2)  Prozac 20 Mg Caps (Fluoxetine hcl) .Marland Kitchen.. 1 by mouth two times a day 3)  Estrace 2 Mg Tabs (Estradiol) .... Take one half by mouth twice a day 4)  Allegra 180 Mg Tabs (Fexofenadine hcl) .... Take one by mouth daily 5)  Alllergy Injections  .... Injection q week 6)  Flonase Susp (Fluticasone propionate susp) .Marland Kitchen.. 1 spray in each nostril twice daily 7)  Zocor 40 Mg Tabs (Simvastatin) .Marland Kitchen.. 1 by mouth once daily 8)  Prosed Ds  .Marland KitchenMarland Kitchen. 1 by mouth up to 4 times daily as needed 9)  Vitamin D 1000 Unit Tabs (Cholecalciferol) .... Take 1 tablet by mouth once a day 10)  Prilosec 20 Mg Cpdr (Omeprazole) .... Take one 30-60 min before first and last meals of the day as long as coughing at all, then once daily before breakfast 11)  Vitamin E 400mg   .... Takes one tablet daily 12)  Omega-3 350 Mg Caps (Omega-3 fatty acids) .... Take 1 capsule by mouth once a day 13)  Glucosamine-chondroitin Caps (Glucosamine-chondroit-vit c-mn) .... Take 1 capsule by mouth once a day  Patient Instructions: 1)  please send for ENT note from Dr Andee Poles  2)  ask your oncologist - Dr Arline Asp- whether you should get a shingles vaccine  3)  work on healthy diet and exercise the best you can  4)  labs today  5)  we will schedule colonoscopy at check out  6)  we will schedule your mammogram at check out  Prescriptions: PROSED DS 1 by mouth up to 4 times daily as needed  #120 x 11   Entered and Authorized by:   Judith Part MD   Signed by:   Judith Part MD on 07/18/2009   Method used:   Print then Give to Patient   RxID:    [id number] ALLEGRA 180 MG TABS (FEXOFENADINE HCL) Take one by mouth daily  #90 x 3   Entered and Authorized by:   Judith Part MD   Signed by:   Judith Part MD on 07/18/2009   Method used:   Print then Give to Patient   RxID:   [id number] PRILOSEC 20 MG CPDR (OMEPRAZOLE) Take one 30-60 min before first and last meals of the day as long as coughing at all, then once daily before breakfast  #180 x 3   Entered and Authorized by:   Judith Part MD   Signed by:   Judith Part MD on 07/18/2009   Method used:   Print then Give to Patient   RxID:   [id number] ZOCOR 40 MG  TABS (SIMVASTATIN) 1 by mouth once daily  #90 x 3   Entered and Authorized by:   Judith Part MD   Signed by:   Judith Part MD on 07/18/2009   Method used:   Print then Give to Patient   RxID:   [id number] FLONASE  SUSP (FLUTICASONE PROPIONATE SUSP) 1 spray in each nostril twice daily  #3 mdi x 3   Entered and Authorized by:   Judith Part MD   Signed by:   Judith Part MD on 07/18/2009   Method used:   Print then Give to Patient   RxID:   [id number] ESTRACE 2 MG TABS (ESTRADIOL) Take one half by mouth twice a day  #90 x 3   Entered and Authorized by:   Judith Part MD   Signed by:   Judith Part MD on 07/18/2009   Method used:   Print then Give to Patient   RxID:   [id number] PROZAC 20 MG CAPS (FLUOXETINE HCL) 1 by mouth two times a day  #180 x 3   Entered and Authorized by:   Judith Part MD   Signed by:   Judith Part MD on 07/18/2009   Method used:   Print then Give to Patient   RxID:   [id number] SYNTHROID 112 MCG TABS (LEVOTHYROXINE SODIUM) Take one by mouth daily  #90 x 3   Entered and Authorized by:   Judith Part MD   Signed by:   Judith Part MD on 07/18/2009   Method used:   Print then Give to Patient   RxID:   [id number]   Current Allergies (reviewed today): ! * ACHROMYCIN ! ALLOPURINOL ! CEPHALEXIN ! * TELFA  PADS ! * MINOCYCLINE RELAFEN PENICILLIN SULFA * ASTROSPRAY CODEINE KEFLEX * MOBIC * AMBIEN CIPRO

## 2010-03-12 NOTE — Letter (Signed)
Summary: [facility]   Imported By: Lester [place] 11/07/2009 11:51:51  _____________________________________________________________________  External Attachment:    Type:   Image     Comment:   External Document

## 2010-03-12 NOTE — Consult Note (Signed)
Summary: [facility]  [facility]   Imported By: Lester [place] 07/28/2009 11:24:32  _____________________________________________________________________  External Attachment:    Type:   Image     Comment:   External Document

## 2010-03-12 NOTE — Miscellaneous (Signed)
Summary: [facility] previsit  Clinical Lists Changes  Medications: Added new medication of MOVIPREP 100 GM  SOLR (PEG-KCL-NACL-NASULF-NA ASC-C) As per prep instructions. - Signed Rx of MOVIPREP 100 GM  SOLR (PEG-KCL-NACL-NASULF-NA ASC-C) As per prep instructions.;  #1 x 0;  Signed;  Entered by: Karl Bales RN;  Authorized by: Louis Meckel MD;  Method used: [facility] to [facility] [phone]*, [address] [place], [place], [place]  [address], Ph: [phone] or [phone], Fax: [phone]    Prescriptions: MOVIPREP 100 GM  SOLR (PEG-KCL-NACL-NASULF-NA ASC-C) As per prep instructions.  #1 x 0   Entered by:   Karl Bales RN   Authorized by:   Louis Meckel MD   Signed by:   Karl Bales RN on 08/09/2009   Method used:   Electronically to        [facility] [phone]* (retail)       [address] [place], [place]  [address]       Ph: [phone] or [phone]       Fax: [phone]   RxID:   [id number]

## 2010-03-12 NOTE — Letter (Signed)
Summary: [facility]  [facility]   Imported By: Lanelle Bal 12/05/2009 10:43:55  _____________________________________________________________________  External Attachment:    Type:   Image     Comment:   External Document

## 2010-03-12 NOTE — Letter (Signed)
Summary: Colonoscopy Letter  [facility]  [address] [place], [place] [address]   Phone: [phone]  Fax: [phone]      May 28, 2009 MRN: [id number]   Diamond Boyd [address] [place], [place]  [address]   Dear [name],   According to your medical record, it is time for you to schedule a Colonoscopy. The American Cancer Society recommends this procedure as a method to detect early colon cancer. Patients with a family history of colon cancer, or a personal history of colon polyps or inflammatory bowel disease are at increased risk.  This letter has beeen generated based on the recommendations made at the time of your procedure. If you feel that in your particular situation this may no longer apply, please contact our office.  Please call our office at [phone] to schedule this appointment or to update your records at your earliest convenience.  Thank you for cooperating with [place] to provide you with the very best care possible.   Sincerely,  Barbette Hair. Arlyce Dice, M.D.  [facility] [phone]

## 2010-03-12 NOTE — Miscellaneous (Signed)
Summary: mammo results  Clinical Lists Changes  Observations: Added new observation of MAMMO DUE: 08/2010 (08/03/2009 15:14) Added new observation of MAMMOGRAM: normal (08/03/2009 15:14)      Preventive Care Screening  Mammogram:    Date:  08/03/2009    Next Due:  08/2010    Results:  normal

## 2010-03-12 NOTE — Progress Notes (Signed)
Summary: wants to resched appt  Phone Note Call from Patient Call back at Home Phone [phone]   Caller: Patient Call For: Judith Part MD Summary of Call: Patient wants to push her lab appt back a couple of weeks, tried to call her back and could not get it touch with her. Will try again later.  Initial call taken by: Melody Comas,  September 11, 2009 2:19 PM  Follow-up for Phone Call        Left message for patient to return call.  Melody Comas  September 11, 2009 4:39 PM   Additional Follow-up for Phone Call Additional follow up Details #1::        Left message for patient to call back.  Additional Follow-up by: Melody Comas,  September 12, 2009 8:32 AM

## 2010-03-12 NOTE — Assessment & Plan Note (Signed)
Summary: COLD,NAUSEA/CLE   Vital Signs:  Patient profile:   70 year old female Height:      65 inches Weight:      234.25 pounds BMI:     39.12 Temp:     97.9 degrees F oral Pulse rate:   76 / minute Pulse rhythm:   regular BP sitting:   124 / 74  (left arm) Cuff size:   large  Vitals Entered By: Lewanda Rife LPN (March 26, 2009 3:08 PM)  CC:  nausea, dry cough, and on 03/25/09 saw strings of bright red blood in stool. BM normal today and rash on upper abdomen.Marland Kitchen  History of Present Illness: never got over her last uri has not gotten over the cough --- very little phlegm if any- light yellow  no wheezing / but rattles a little  very nauseated on and off no vomiting   no fever at all   is tired   some debris in tonsil - cannot get it out / it smells bad   yesterday stool was light -- more than usual  a little brb in stool yesterday-- gone now no rectal pain  some crampig - no pushing   eczema on abd - is itching  is finally better with that   Allergies: 1)  ! * Achromycin 2)  ! Allopurinol 3)  ! Cephalexin 4)  ! * Telfa Pads 5)  ! 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 spine center in past) lymphoma Cough.....................................................................Marland KitchenWert    - onset 09/2008   oncol- DR Murinson derm [facility] Sx Byrnett all [place] [facility]   Past Surgical History: Last updated: 07/17/2008 Cholecystectomy Hysterectomy for ? uterine prolapse- not cancer (partial) Splenectomy- lymphoma  Breast biopsy (1996) Plantar fascitis release bilaterally Ventral hernia repair (1998) ? tendon release on R thumb-  past Colonoscopy- neg (04/1999) ,  polyps (05/2004) Dexa- osteopenia (03/2000) ,  no change (01/2004) Admit- SOB/ fever (01/2001) Flex sig- neg. 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 Eyes:  Denies blurring and eye pain. ENT:  Complains of earache, nasal congestion, sinus pressure, and sore throat. CV:  Denies chest pain or discomfort, palpitations, shortness of breath with exertion, and swelling of feet. Resp:  Denies cough and shortness of breath. GI:  Complains of change in bowel habits; denies abdominal pain, indigestion, and nausea. GU:  Denies dysuria. MS:  Denies joint pain, joint redness, and joint swelling. Derm:  Denies lesion(s), poor wound healing, and rash. Neuro:  Denies numbness and tingling. Psych:  Denies anxiety and depression. Endo:  Denies excessive thirst and excessive urination. Heme:  Denies abnormal bruising.  Physical Exam  General:  overweight but generally well appearing  Head:  normocephalic, atraumatic, and  no abnormalities observed.  mild tenderness of maxillary sinuses  Eyes:  vision grossly intact, pupils equal, pupils round, pupils reactive to light, and no injection.   Ears:  R ear normal and L ear normal.   Nose:  nares boggy with clear rhinorrhea Mouth:  pharynx pink and moist, no erythema, and no exudates.   Neck:  supple with full rom and no masses or thyromegally, no JVD or carotid bruit  Lungs:  Normal respiratory effort, chest expands  symmetrically. Lungs are clear to auscultation, no crackles or wheezes. bs are diffusely harsh at bases without wheeze Heart:  Normal rate and regular rhythm. S1 and S2 normal without gallop, murmur, click, rub or other extra sounds. Abdomen:  Bowel sounds positive,abdomen soft and non-tender without masses, organomegaly or hernias noted. no suprapubic tenderness or fullness felt  Msk:  No deformity or scoliosis noted of thoracic or lumbar spine.  no acute joint changes  Pulses:  R and L carotid,radial,femoral,dorsalis pedis and posterior tibial pulses are full and equal bilaterally Extremities:  No clubbing, cyanosis, edema, or deformity noted with normal full range of motion of all joints.   Neurologic:  sensation intact to light touch, gait normal, and DTRs symmetrical and normal.   Skin:  small 2 by 2 cm patch of dry skin on mid abd no redness  Cervical Nodes:  No lymphadenopathy noted Psych:  normal affect, talkative and pleasant -- seems fatgigued    Impression & Recommendations:  Problem # 1:  BRONCHITIS, ACUTE (ICD-466.0) Assessment New s/p uri with persistant cough  will cover with zpak-esp in light of immunocomp status  cbc today and update feel nausea is related- will stop cough med and update  Her updated medication list for this problem includes:    Tussionex Pennkinetic Er 8-10 Mg/60ml Lqcr (Chlorpheniramine-hydrocodone) .Marland Kitchen... 1/2 to 1 teaspoon up to two times a day as needed severe cough  warning - this will sedate    Zithromax Z-pak 250 Mg Tabs (Azithromycin) .Marland Kitchen... Take by mouth as directed  Orders: Venipuncture ([address]) TLB-CBC Platelet - w/Differential (85025-CBCD) Prescription Created Electronically [phone])  Problem # 2:  BLOOD IN STOOL (ICD-578.1) Assessment: New one episode of streak in stool- ? after diarrhea will continue to watch  enc use of stool softener and really inc water  update if it happens again is almost time for 5 y colonosc - may have to move it  up if symptoms re- occur  Problem # 3:  RASH AND OTHER NONSPECIFIC SKIN ERUPTION (ICD-782.1) Assessment: New mild dry skin on abd - resembles eczema  enc use of moisturizer like eucerin update if not imp   Complete Medication List: 1)  Synthroid 112 Mcg Tabs (Levothyroxine sodium) .... Take one by mouth daily 2)  Prozac 20 Mg Caps (Fluoxetine hcl) .Marland Kitchen.. 1 by mouth two times a day 3)  Estrace 2 Mg Tabs (Estradiol) .... Take one half by mouth bid 4)  Allegra 180 Mg Tabs (Fexofenadine hcl) .... Take one by mouth daily 5)  Alllergy Injections  .... Injection q week 6)  Flonase Susp (Fluticasone propionate susp) .Marland Kitchen.. 1 spray in each nostril twice daily 7)  Zocor 40 Mg Tabs (Simvastatin) .Marland Kitchen.. 1 by mouth once daily 8)  Prosed Ds  .Marland KitchenMarland Kitchen. 1 by mouth up to 4 times daily as needed 9)  Vitamin D 1000 Unit Tabs (Cholecalciferol) .... Take 2 tablet by mouth once a day 10)  Prilosec 20 Mg Cpdr (Omeprazole) .... Take one 30-60 min before first and  last meals of the day as long as coughing at all, then once daily before breakfast 11)  Tussionex Pennkinetic Er 8-10 Mg/40ml Lqcr (Chlorpheniramine-hydrocodone) .... 1/2 to 1 teaspoon up to two times a day as needed severe cough  warning - this will sedate 12)  Vitamin E 400mg   .... Takes one tablet daily 13)  Zithromax Z-pak 250 Mg Tabs (Azithromycin) .... Take by mouth as directed  Patient Instructions: 1)  please continue your stool softener and drink lots of water  2)  if blood comes back in stool let me know  3)  stick with a bland diet until nausea improved (let me know if not improving next week)  4)  take the zithromax for bronchitis- I sent it to your pharmacy 5)  update me if your symptoms do not improve in the next week or if you get worse  Prescriptions: ZITHROMAX Z-PAK 250 MG TABS (AZITHROMYCIN) take by mouth as directed  #1 pack x 0   Entered and Authorized by:   Judith Part MD   Signed by:   Judith Part MD on 03/26/2009   Method used:    [facility]        [facility] [phone]* (retail)       [address] [place], [place]  [address]       Ph: [phone] or [phone]       Fax: [phone]   RxID:   [id number]   Current Allergies (reviewed today): ! * ACHROMYCIN ! ALLOPURINOL ! CEPHALEXIN ! * TELFA PADS ! * MINOCYCLINE RELAFEN PENICILLIN SULFA * ASTROSPRAY CODEINE KEFLEX * MOBIC * AMBIEN CIPRO

## 2010-03-12 NOTE — Consult Note (Signed)
Summary: [facility]   Imported By: Lanelle Bal 09/10/2009 10:20:26  _____________________________________________________________________  External Attachment:    Type:   Image     Comment:   External Document

## 2010-03-12 NOTE — Procedures (Signed)
Summary: Colonoscopy  Patient: Diamond Boyd Note: All result statuses are Final unless otherwise noted.  Tests: (1) Colonoscopy (COL)   COL Colonoscopy           DONE     [facility]     [address]     [place], [place]  [address]           COLONOSCOPY PROCEDURE REPORT           PATIENT:  [name], [name]  MR#:  [id number]     BIRTHDATE:  1940-07-25, 68 yrs. old  GENDER:  female           ENDOSCOPIST:  Barbette Hair. Arlyce Dice, MD     Referred by:           PROCEDURE DATE:  08/23/2009     PROCEDURE:  Colonoscopy with biopsy, Colonoscopy with snare     polypectomy     ASA CLASS:  Class II     INDICATIONS:  1) screening  2) history of pre-cancerous     (adenomatous) colon polyps           MEDICATIONS:   Fentanyl 100 mcg IV, Versed 10 mg IV           DESCRIPTION OF PROCEDURE:   After the risks benefits and     alternatives of the procedure were thoroughly explained, informed     consent was obtained.  Digital rectal exam was performed and     revealed no abnormalities.   The LB [id number] endoscope     was introduced through the anus and advanced to the cecum, which     was identified by both the appendix and ileocecal valve, without     limitations.  The quality of the prep was excellent, using     MoviPrep.  The instrument was then slowly withdrawn as the colon     was fully examined.     <<PROCEDUREIMAGES>>           FINDINGS:  A mass was found. 5cm exophytic mass distal transverse     colon. Bxs taken (see image4).  A mass was found in the sigmoid     colon. 2-3cm sessile polyp/mass with slight oozing of blood at     20cm from anus. Bxs taken (see image11).  A sessile polyp was     found in the proximal transverse colon. It was 3 mm in size. Polyp     was snared without cautery. Retrieval was successful (see image3).     snare polyp  This was otherwise a normal examination of the colon     (see image1, image2, image6, image9, image10, image13, and  image14).   Retroflexed views in the rectum revealed no     abnormalities.    The time to cecum =  3.75  minutes. The scope     was then withdrawn (time =  9.0  min) from the patient and the     procedure completed.           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           REPEAT EXAM:  In 1 year(s) for Colonoscopy.           ______________________________     Barbette Hair. Arlyce Dice, MD           CC: Judy Pimple, MD, Kimberlee Nearing, MD, Claud Kelp, MD           n.     eSIGNED:   Barbette Hair. Esbeidy Mclaine at 08/23/2009 11:43 AM           Page 2 of 3   Joud, Pettinato Morris, [id number]  Note: An exclamation mark (!) indicates a result that was not dispersed into the flowsheet. Document Creation Date: 08/23/2009 11:43 AM _______________________________________________________________________  (1) Order result status: Final Collection or observation date-time: 08/23/2009 11:26 Requested date-time:  Receipt date-time:  Reported date-time:  Referring Physician:   Ordering Physician: Melvia Heaps [phone]) Specimen Source:  Source: Launa Grill Order Number: [id number] Lab site:   Appended Document: Colonoscopy     Procedures Next Due Date:    Colonoscopy: 08/2010

## 2010-03-12 NOTE — Consult Note (Signed)
Summary: [facility]  [facility]   Imported By: Lanelle Bal 07/26/2009 13:47:38  _____________________________________________________________________  External Attachment:    Type:   Image     Comment:   External Document

## 2010-03-12 NOTE — Assessment & Plan Note (Signed)
Summary: HEADACHES EVERYDAY & PAIN IN LEGS / LFW   Vital Signs:  Patient profile:   70 year old female Height:      65 inches Weight:      231.75 pounds BMI:     38.70 Temp:     98.7 degrees F oral Pulse rate:   88 / minute Pulse rhythm:   regular BP sitting:   110 / 66  (left arm) Cuff size:   large  Vitals Entered By: Lewanda Rife LPN (Jul 03, 2009 3:24 PM) CC: h/a and dull pain in left leg from hip to foot.(no known injury)   History of Present Illness: still has debris in tonsils - wants to look at that  throat stays raw and irritated - may need to see ENT  has had a lot of headaches for 5 weeks  thinks it is sinus problems  most of it is on the L side  massage and tylenol helps a bit more  is congested  at times a lot of drainage  is yellow mucous  some pain L cheek   dull pain in L leg -- all the way down -- especially inhamstring area- is sore to the touch  also pain down lateral leg and heel and foot  not swollen or red  worse to get in the car  back is sore on R - opposite side of the leg  used to use pain patches- is more sore lately   spends a lot of time in recliner -- due to pain    Allergies: 1)  ! * Achromycin 2)  ! Allopurinol 3)  ! Cephalexin 4)  ! * Telfa Pads 5)  ! 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 spine center in past) lymphoma Cough.....................................................................Marland KitchenWert    - onset 09/2008   oncol- DR Murinson derm [facility] Sx Byrnett all [facility]   Past Surgical History: Last updated: 07/17/2008 Cholecystectomy Hysterectomy for ? uterine prolapse- not cancer  (partial) Splenectomy- lymphoma  Breast biopsy (1996) Plantar fascitis release bilaterally Ventral hernia repair (1998) ? tendon release on R thumb- past Colonoscopy- neg (04/1999) ,  polyps (05/2004) Dexa- osteopenia (03/2000) ,  no change (01/2004) Admit- SOB/ fever (01/2001) Flex sig- neg. 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 Eyes:  Denies blurring and discharge. ENT:  Complains of earache, nasal congestion, postnasal drainage, sinus pressure, and sore throat. CV:  Denies chest pain or discomfort and palpitations. Resp:  Complains of cough; denies shortness of breath and sputum productive. GI:  Denies abdominal pain, change in bowel habits, indigestion, loss of appetite, nausea, and vomiting. GU:  Denies dysuria, hematuria, and urinary frequency. MS:  Complains of joint pain, muscle aches, and stiffness; denies  cramps and muscle weakness. Derm:  Denies itching, lesion(s), poor wound healing, and rash. Neuro:  Denies numbness, tingling, and weakness. Psych:  mood is fair husband states she is irritable . Endo:  Denies cold intolerance, excessive thirst, excessive urination, and heat intolerance. Heme:  Denies abnormal bruising and bleeding. Allergy:  Complains of seasonal allergies.  Physical Exam  General:  overweight but generally well appearing  Head:  normocephalic, atraumatic, and no abnormalities observed.  L maxillary sinus tenderness  Eyes:  vision grossly intact, pupils  equal, pupils round, pupils reactive to light, and no injection.   Ears:  R ear normal and L ear normal.   Nose:  nares are injected and congested bilaterally  Mouth:  pharynx pink and moist, no erythema, and no exudates.  some post nasal drainage Neck:  supple with full rom and no masses or thyromegally, no JVD or carotid bruit  Lungs:  Normal respiratory effort, chest expands symmetrically. Lungs are clear to auscultation, no crackles or wheezes. Heart:  Normal rate and regular rhythm. S1 and S2 normal without gallop, murmur, click, rub or other extra sounds. Abdomen:  Bowel sounds positive,abdomen soft and non-tender without masses, organomegaly or hernias noted. no suprapubic tenderness or fullness felt  Msk:  tender L4- L5 poor rom LS  tender L greater trochanter mildly pos SLR L for leg and foot pain no leg tenderness no new swelling /palp cord noted  gait is normal  Pulses:  R and L carotid,radial,femoral,dorsalis pedis and posterior tibial pulses are full and equal bilaterally Extremities:  edema controlled with support hose today Neurologic:  strength normal in all extremities, sensation intact to light touch, gait normal, and DTRs symmetrical and normal.   Skin:  Intact without suspicious lesions or rashes Cervical Nodes:  No lymphadenopathy noted Inguinal Nodes:  No significant adenopathy Psych:  pt is frustrated and irritable today here with husband - is arguing with him    Impression & Recommendations:  Problem # 1:  LEG PAIN, LEFT (ICD-729.5) Assessment New with trochanteric tenderness and also c/o pain down whole lat leg consistent with sciatica  pt is obese and also hx of disc dz check hip x ray and also LS x ray and adv further disc guidelines for tylenol dosing   Orders: Radiology Referral (Radiology)  Problem # 2:  SINUSITIS - ACUTE-NOS (ICD-461.9) Assessment: New  recurrent/ frequent with L maxillary tenderness and chronic drainage tx wtih zpak-- pt  allergic to most other meds  for this and chronic throat discomfort -ref to ENT The following medications were removed from the medication list:    Tussionex Pennkinetic Er 8-10 Mg/89ml Lqcr (Chlorpheniramine-hydrocodone) .Marland Kitchen... 1/2 to 1 teaspoon up to two times a day as needed severe cough  warning - this will sedate    Zithromax Z-pak 250 Mg Tabs (Azithromycin) .Marland Kitchen... Take by mouth as directed Her updated medication list for this problem includes:    Flonase Susp (Fluticasone propionate susp) .Marland Kitchen... 1 spray in each nostril twice daily    Zithromax Z-pak 250 Mg Tabs (Azithromycin) .Marland Kitchen... Take by mouth as directed  Orders: Prescription Created Electronically [phone])  Problem # 3:  THROAT PAIN, CHRONIC (ICD-784.1) Assessment: Unchanged ongoing sens of fb in throat L side that is not visible ref to ent for further eval  Orders: ENT Referral (ENT)  Complete Medication List: 1)  Synthroid 112 Mcg Tabs (Levothyroxine sodium) .... Take one by mouth daily 2)  Prozac 20 Mg Caps (Fluoxetine hcl) .Marland Kitchen.. 1 by mouth two  times a day 3)  Estrace 2 Mg Tabs (Estradiol) .... Take one half by mouth bid 4)  Allegra 180 Mg Tabs (Fexofenadine hcl) .... Take one by mouth daily 5)  Alllergy Injections  .... Injection q week 6)  Flonase Susp (Fluticasone propionate susp) .Marland Kitchen.. 1 spray in each nostril twice daily 7)  Zocor 40 Mg Tabs (Simvastatin) .Marland Kitchen.. 1 by mouth once daily 8)  Prosed Ds  .Marland KitchenMarland Kitchen. 1 by mouth up to 4 times daily as needed 9)  Vitamin D 1000 Unit Tabs (Cholecalciferol) .... Take 2 tablet by mouth once a day 10)  Prilosec 20 Mg Cpdr (Omeprazole) .... Take one 30-60 min before first and last meals of the day as long as coughing at all, then once daily before breakfast 11)  Vitamin E 400mg   .... Takes one tablet daily 12)  Omega-3 350 Mg Caps (Omega-3 fatty acids) .... Take 1 capsule by mouth once a day 13)  Glucosamine-chondroitin Caps (Glucosamine-chondroit-vit c-mn) .... Take 1 capsule by mouth once a  day 14)  Zithromax Z-pak 250 Mg Tabs (Azithromycin) .... Take by mouth as directed  Patient Instructions: 1)  we will do ENT referral at check out  2)  take the zithromax as directed for sinus infection  3)  continue tylenol for pain as needed  4)  we will set up x rays for leg pain at check out  5)  please update me if pain or other symptoms worsen  Prescriptions: ZITHROMAX Z-PAK 250 MG TABS (AZITHROMYCIN) take by mouth as directed  #1 pack x 0   Entered and Authorized by:   Judith Part MD   Signed by:   Judith Part MD on 07/03/2009   Method used:   [facility] to        [facility] [phone]* (retail)       [address] [place], [place]  [address]       Ph: [phone] or [phone]       Fax: [phone]   RxID:   [id number]   Current Allergies (reviewed today): ! * ACHROMYCIN ! ALLOPURINOL ! CEPHALEXIN ! * TELFA PADS ! * MINOCYCLINE RELAFEN PENICILLIN SULFA * ASTROSPRAY CODEINE KEFLEX * MOBIC * AMBIEN CIPRO

## 2010-03-12 NOTE — Letter (Signed)
[facility]   Imported By: Lanelle Bal 08/14/2009 12:51:27  _____________________________________________________________________  External Attachment:    Type:   Image     Comment:   External Document

## 2010-03-12 NOTE — Consult Note (Signed)
Summary: [facility]  [facility]   Imported By: Lanelle Bal 09/11/2009 09:32:37  _____________________________________________________________________  External Attachment:    Type:   Image     Comment:   External Document

## 2010-03-14 NOTE — Progress Notes (Signed)
Summary: rash on vulva  Phone Note Call from Patient   Caller: Patient Call For: Judith Part MD Summary of Call: Pt states she now has rash on vulva, she didnt have the rash when she saw you last.  This is itching her.  She says she had something similar before, which she took diflucan for, and that helped.  [facility] [address] Initial call taken by: Lowella Petties CMA, AAMA,  March 06, 2010 4:38 PM  Follow-up for Phone Call        she likely has yeast infection from the zpak ok to try diflucan and f/u if not imp in 3-4 d hold zocor for 3 days after taking diflucan -- the 2 can interact we have learned  Follow-up by: Judith Part MD,  March 06, 2010 5:00 PM  Additional Follow-up for Phone Call Additional follow up Details #1::        Med [facility] to [facility] as instructed. Patient notified as instructed by telephone. Lewanda Rife LPN  March 06, 2010 5:31 PM     New/Updated Medications: DIFLUCAN 150 MG TABS (FLUCONAZOLE) 1 by mouth times one for yeast infection Prescriptions: DIFLUCAN 150 MG TABS (FLUCONAZOLE) 1 by mouth times one for yeast infection  #1 x 0   Entered by:   Lewanda Rife LPN   Authorized by:   Judith Part MD   Signed by:   Lewanda Rife LPN on 21/30/8657   Method used:   [facility] to        [facility] [phone]* (retail)       [address] [place], [place]  [address]       Ph: [phone] or [phone]       Fax: [phone]   RxID:   [id number]

## 2010-03-14 NOTE — Letter (Signed)
Summary: [facility]  [facility]   Imported By: Lanelle Bal 02/05/2010 10:48:50  _____________________________________________________________________  External Attachment:    Type:   Image     Comment:   External Document

## 2010-03-14 NOTE — Assessment & Plan Note (Signed)
Summary: CHECK LUMP/CLE   Vital Signs:  Patient profile:   70 year old female Weight:      224.25 pounds BMI:     37.45 Temp:     97.9 degrees F oral Pulse rate:   82 / minute Pulse rhythm:   regular BP sitting:   124 / 76  (left arm) Cuff size:   large  Vitals Entered By: Selena Batten Dance CMA Duncan Dull) (February 27, 2010 11:55 AM) CC: ? Lump in vaginal area/ ? Sinus infection Comments Patient says it feels "scratchy" and painful during intercourse.   History of Present Illness: lump in vaginal area - pain with intercourse noticed that a few months ago - not painful , and feels like good size - does not drain  is on the right   is having discomfort with intercourse - ? dryness no worries of stds no vaginal d/c  used a lubricant once (? brand) and it broke her out   thinks she has sinus infection  is having pain in head and cheeks -- worse on the L side  eyes are sore / pressure tylenol helps  no fever at all  blowing out green d/c  some cough- is dry  some post nasal drainage  clearing throat a lot  wants me to look in throat     Allergies: 1)  ! * Achromycin 2)  ! Allopurinol 3)  ! Cephalexin 4)  ! * Telfa Pads 5)  ! * Minocycline 6)  Relafen 7)  Penicillin 8)  Sulfa 9)  * Astrospray 10)  Codeine 11)  Keflex 12)  * Mobic 13)  * Ambien 14)  Cipro  Past History:  Past Medical History: Last updated: 09/10/2009 Skin cancer, hx of- basal cell on arm Colonic polyps, hx of Depression Hypothyroidism Osteoarthritis- hands Peripheral vascular disease LS deg disk dz (? spine center in past) lymphoma Cough.....................................................................Marland KitchenWert    - onset 09/2008 7/11 colon cancer   oncol- DR Murinson derm [facility] Arlyce Dice Sx [facility] all [facility]   Past Surgical History: Last updated: 09/30/2009 Cholecystectomy Hysterectomy for ? uterine prolapse- not cancer (partial) Splenectomy- lymphoma  Breast biopsy (1996) Plantar  fascitis release bilaterally Ventral hernia repair (1998) ? tendon release on R thumb- past Colonoscopy- neg (04/1999) ,  polyps (05/2004) Dexa- osteopenia (03/2000) ,  no change (01/2004) Admit- SOB/ fever (01/2001) Flex sig- neg. 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 Eyes:  Denies blurring and eye irritation. CV:  Denies chest pain or discomfort, palpitations, and shortness of breath with exertion. Resp:  Complains of cough; denies pleuritic, shortness of breath, and wheezing. GI:  Denies abdominal pain, change in bowel habits, indigestion, and nausea. GU:  Denies discharge, dysuria, and genital sores. MS:  Denies cramps and stiffness. Derm:  Denies itching, lesion(s), poor wound healing, and rash. Neuro:  Denies numbness and tingling. Endo:  Denies cold intolerance and heat intolerance. Heme:  Denies abnormal bruising and bleeding.  Physical Exam  General:  overweight but generally well  appearing  Head:  normocephalic, atraumatic, and no abnormalities observed.  R maxillary sinus tenderness R ethmoid sinus tenderness Eyes:  vision grossly intact, pupils equal, pupils round, and pupils reactive to light.  no conjunctival pallor, injection or icterus  Ears:  R ear normal and L ear normal.   Nose:  nares are injected and congested bilaterally  Mouth:  pharynx pink and moist.   Neck:  supple with full rom and no masses  or thyromegally, no JVD or carotid bruit  Lungs:  Normal respiratory effort, chest expands symmetrically. Lungs are clear to auscultation, no crackles or wheezes. Heart:  Normal rate and regular rhythm. S1 and S2 normal without gallop, murmur, click, rub or other extra sounds. Abdomen:  Bowel sounds positive,abdomen soft and non-tender without masses, organomegaly or hernias noted. no suprapubic tenderness or fullness felt  Genitalia:  vaginal mucosa is dry without lesions or signs of trauma no M on bimanual exam  .5 cm superficial vaginal lump felt to R side -- somewhat deep in vaginal vault -- it moves with the mucosa no d/c seen Msk:  No deformity or scoliosis noted of thoracic or lumbar spine.   Neurologic:  sensation intact to light touch, gait normal, and DTRs symmetrical and normal.   Skin:  Intact without suspicious lesions or rashes Cervical Nodes:  No lymphadenopathy noted Inguinal Nodes:  No significant adenopathy Psych:  normal affect, talkative and pleasant    Impression & Recommendations:  Problem # 1:  VAGINITIS, BACTERIAL (ICD-616.10) Assessment New  tx with metrogel vaginal and update - re: discomfort evidence of atrophic vaginitis also noted  small lump in vaginal vault feels like cyst / calcium deposit- will monitor this Her updated medication list for this problem includes:    Zithromax Z-pak 250 Mg Tabs (Azithromycin) .Marland Kitchen... Take by mouth as directed for sinus infection    Metrogel-vaginal 0.75 % Gel (Metronidazole) .Marland Kitchen... 1 applicator intravaginally at bedtime for 7 days  Orders: Wet Prep (13086VH) Prescription Created Electronically [phone])  Problem # 2:  SINUSITIS, ACUTE (ICD-461.9) Assessment: New  in immunocomp pt  tx with zpak  recommend sympt care- see pt instructions   pt advised to update me if symptoms worsen or do not improve  Her updated medication list for this problem includes:    Flonase Susp (Fluticasone propionate susp) .Marland Kitchen... 1 spray in  each nostril twice daily    Zithromax Z-pak 250 Mg Tabs (Azithromycin) .Marland Kitchen... Take by mouth as directed for sinus infection  Orders: Prescription Created Electronically [phone])  Complete Medication List: 1)  Synthroid 112 Mcg Tabs (Levothyroxine sodium) .... Take one by mouth daily 2)  Prozac 20 Mg Caps (Fluoxetine hcl) .Marland Kitchen.. 1 by mouth two times a day 3)  Estrace 2 Mg Tabs (Estradiol) .... Take one half by mouth twice a day 4)  Allegra 180 Mg Tabs (Fexofenadine hcl) .... Take one by mouth daily 5)  Alllergy Injections  .... Injection q week 6)  Flonase Susp (Fluticasone propionate susp) .Marland Kitchen.. 1 spray in each nostril twice daily 7)  Zocor 40 Mg Tabs (Simvastatin) .Marland Kitchen.. 1 by mouth once daily 8)  Prosed Ds  .Marland KitchenMarland Kitchen. 1 by mouth up to 4 times daily as needed 9)  Vitamin D 1000 Unit Tabs (Cholecalciferol) .... Take 1 tablet by mouth once a day 10)  Prilosec 20 Mg Cpdr (Omeprazole) .... Take one 30-60 min before first and last meals of the day as long as coughing at all, then once daily before breakfast 11)  Vitamin  E 400mg   .... Takes one tablet daily 12)  Omega-3 350 Mg Caps (Omega-3 fatty acids) .... Take 1 capsule by mouth once a day 13)  Glucosamine-chondroitin Caps (Glucosamine-chondroit-vit c-mn) .... Take 1 capsule by mouth once a day 14)  Zithromax Z-pak 250 Mg Tabs (Azithromycin) .... Take by mouth as directed for sinus infection 15)  Nu-iron 150 Mg Caps (Polysaccharide iron complex) .... One capsule by mouth two times a day 16)  Diflucan 150 Mg Tabs (Fluconazole) .Marland Kitchen.. 1 by mouth once for yeast infection 17)  Celebrex 200 Mg Caps (Celecoxib) .Marland Kitchen.. 1 by mouth two times a day 18)  Metrogel-vaginal 0.75 % Gel (Metronidazole) .Marland Kitchen.. 1 applicator intravaginally at bedtime for 7 days  Patient Instructions: 1)  you can try mucinex over the counter twice daily as directed and nasal saline spray for congestion 2)  tylenol over the counter as directed may help with aches, headache and fever 3)  call if  symptoms worsen or if not improved in 4-5 days 4)  take zithromax as directed for sinus infection  5)  use the metrogel vaginal for bacterial vaginal infection  6)  if symptoms do not improve let me know  Prescriptions: METROGEL-VAGINAL 0.75 % GEL (METRONIDAZOLE) 1 applicator intravaginally at bedtime for 7 days  #7 days x 0   Entered and Authorized by:   Judith Part MD   Signed by:   Judith Part MD on 02/27/2010   Method used:   Electronically to        [facility] [phone]* (retail)       [address] [place], [place]  [address]       Ph: [phone] or [phone]       Fax: [phone]   RxID:   [id number] ZITHROMAX Z-PAK 250 MG TABS (AZITHROMYCIN) take by mouth as directed for sinus infection  #1 pack x 0   Entered and Authorized by:   Judith Part MD   Signed by:   Judith Part MD on 02/27/2010   Method used:   Electronically to        [facility] [phone]* (retail)       [address] [place], [place]  [address]       Ph: [phone] or [phone]       Fax: [phone]   RxID:   [id number]    Orders Added: 1)  Est. Patient Level IV [42706] 2)  Wet Prep [23762GB] 3)  Prescription Created Electronically [phone]    Current Allergies (reviewed today): ! * ACHROMYCIN ! ALLOPURINOL ! CEPHALEXIN ! * TELFA PADS ! * MINOCYCLINE RELAFEN PENICILLIN SULFA * ASTROSPRAY CODEINE KEFLEX * MOBIC * AMBIEN CIPRO    Laboratory Results    Wet Mount/KOH Source: vaginal WBC/hpf 5-10 Bacteria/hpf 2+ Clue cells/hpf moderate  Negative whiff Yeast/hpf none KOH Negative Trichomonas/hpf none

## 2010-03-26 ENCOUNTER — Other Ambulatory Visit (HOSPITAL_COMMUNITY): Payer: Self-pay | Admitting: Oncology

## 2010-03-26 ENCOUNTER — Encounter (HOSPITAL_BASED_OUTPATIENT_CLINIC_OR_DEPARTMENT_OTHER): Payer: Medicare Other | Admitting: Oncology

## 2010-03-26 ENCOUNTER — Encounter: Payer: Self-pay | Admitting: Family Medicine

## 2010-03-26 DIAGNOSIS — D649 Anemia, unspecified: Secondary | ICD-10-CM

## 2010-03-26 DIAGNOSIS — C8589 Other specified types of non-Hodgkin lymphoma, extranodal and solid organ sites: Secondary | ICD-10-CM

## 2010-03-26 DIAGNOSIS — C189 Malignant neoplasm of colon, unspecified: Secondary | ICD-10-CM

## 2010-03-26 LAB — CBC WITH DIFFERENTIAL/PLATELET
Basophils Absolute: 0 10*3/uL (ref 0.0–0.1)
EOS%: 4.8 % (ref 0.0–7.0)
HGB: 12.5 g/dL (ref 11.6–15.9)
MCH: 30.6 pg (ref 25.1–34.0)
MCV: 92.4 fL (ref 79.5–101.0)
MONO%: 18.5 % — ABNORMAL HIGH (ref 0.0–14.0)
NEUT%: 36 % — ABNORMAL LOW (ref 38.4–76.8)
RDW: 16.4 % — ABNORMAL HIGH (ref 11.2–14.5)

## 2010-04-03 ENCOUNTER — Ambulatory Visit (INDEPENDENT_AMBULATORY_CARE_PROVIDER_SITE_OTHER): Payer: Medicare Other | Admitting: Family Medicine

## 2010-04-03 ENCOUNTER — Encounter: Payer: Self-pay | Admitting: Family Medicine

## 2010-04-03 DIAGNOSIS — R0602 Shortness of breath: Secondary | ICD-10-CM | POA: Insufficient documentation

## 2010-04-03 DIAGNOSIS — F411 Generalized anxiety disorder: Secondary | ICD-10-CM

## 2010-04-03 LAB — COMPREHENSIVE METABOLIC PANEL
ALT: 15 U/L (ref 0–35)
AST: 18 U/L (ref 0–37)
Albumin: 4.3 g/dL (ref 3.5–5.2)
Calcium: 9.7 mg/dL (ref 8.4–10.5)
Chloride: 102 mEq/L (ref 96–112)
Potassium: 4.3 mEq/L (ref 3.5–5.3)
Sodium: 139 mEq/L (ref 135–145)
Total Protein: 5.9 g/dL — ABNORMAL LOW (ref 6.0–8.3)

## 2010-04-03 LAB — IRON AND TIBC
%SAT: 41 % (ref 20–55)
Iron: 118 ug/dL (ref 42–145)

## 2010-04-03 LAB — TRANSFERRIN RECEPTOR, SOLUABLE: Transferrin Receptor, Soluble: 1.53

## 2010-04-09 NOTE — Assessment & Plan Note (Signed)
Summary: trouble breathing/rbh   Vital Signs:  Patient profile:   70 year old female Weight:      225 pounds O2 Sat:      97 % on Room air Temp:     98.1 degrees F oral Pulse rate:   84 / minute Pulse rhythm:   irregular BP sitting:   118 / 60  (left arm) Cuff size:   large  Vitals Entered By: Selena Batten Dance CMA (AAMA) (April 03, 2010 4:02 PM)  O2 Flow:  Room air CC: Feels short of breath   History of Present Illness: here for sensation of sob feeling short of breath - worse than usual with a smothering sensation for 2 weeks  not exertional - is the same all the time  ? if has been anxious- could have to do with her symptoms  feels like she is breathing fast at times -- but not panting  occ pressure sensation in her head  no pain to take a deep breath  no colds had sinus infection 1/18 - and got better with zithromax  had a fleeting sharp pain in L breast - but no "chest" pressure or pain  was a little sore to the touch  no leg complaints or swelling  no fever  anemia is better - is on iron   gerd - not giving her a problem  L tonsil was sore this week -- and more yellow area behind it    wt is stable  bp is 118/60 pulse ox on room air is 97%  hx of anemia and gerd and chronic cough   she cut back on her prozac from 40 to 20 mg a while back   hb is up to 12.5 as well   has a daughter who had knee surgery - she falls down and sleeps on the floor  she has a lot of health problems and constantly needs help also grandson with dui who may go to jail  she also herself has just had cancer    Allergies: 1)  ! * Achromycin 2)  ! Allopurinol 3)  ! Cephalexin 4)  ! * Telfa Pads 5)  ! * Minocycline 6)  Relafen 7)  Penicillin 8)  Sulfa 9)  * Astrospray 10)  Codeine 11)  Keflex 12)  * Mobic 13)  * Ambien 14)  Cipro  Past History:  Past Medical History: Last updated: 09/10/2009 Skin cancer, hx of- basal cell on arm Colonic polyps, hx  of Depression Hypothyroidism Osteoarthritis- hands Peripheral vascular disease LS deg disk dz (? spine center in past) lymphoma Cough.....................................................................Marland KitchenWert    - onset 09/2008 7/11 colon cancer   oncol- DR Murinson derm [facility] Arlyce Dice Sx [facility] all [place] [facility]   Past Surgical History: Last updated: 09/30/2009 Cholecystectomy Hysterectomy for ? uterine prolapse- not cancer (partial) Splenectomy- lymphoma  Breast biopsy (1996) Plantar fascitis release bilaterally Ventral hernia repair (1998) ? tendon release on R thumb- past Colonoscopy- neg (04/1999) ,  polyps (05/2004) Dexa- osteopenia (03/2000) ,  no change (01/2004) Admit- SOB/ fever (01/2001) Flex sig- neg. 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 her - who also smokes and takes  advantage of her  non smoker no alcohol does not exercise   Risk Factors: Smoking Status: never (05/04/2008)  Family History: Father: jaw ca Mother: CAD in 70s Siblings: 2 brothers, 2 sisters brother with DM sister etoh daugter with fibromyalgia and IC/ chronic pain/ copd and smoker   Social History: Marital Status: Married Children: 2 Occupation: retired Engineer, 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 Eyes:  Denies blurring and eye irritation. CV:  Denies chest pain or discomfort, fainting,  lightheadness, palpitations, shortness of breath with exertion, and swelling of feet. Resp:  Complains of shortness of breath; denies chest discomfort, cough, morning headaches, pleuritic, sputum productive, and wheezing. GI:  Denies abdominal pain, change in bowel habits, indigestion, nausea, and vomiting. GU:  Denies dysuria and urinary frequency. MS:  Denies joint pain, joint redness, and joint swelling. Derm:  Denies itching, lesion(s), poor wound healing, and rash. Neuro:  Denies numbness and tingling. Psych:  Complains of anxiety, easily tearful, irritability, and panic attacks; denies depression, sense of great danger, and suicidal thoughts/plans. Endo:  Denies cold intolerance, excessive thirst, excessive urination, and heat intolerance. Heme:  Denies abnormal bruising and bleeding.  Physical Exam  General:  overweight but generally well appearing  very anxious today Head:  normocephalic, atraumatic, and no abnormalities observed.  no sinus tenderness Eyes:  vision grossly intact, pupils equal, pupils round, and pupils reactive to light.   Ears:  R ear normal and L ear normal.   Nose:  no nasal discharge.   Mouth:  pharynx pink and moist.   Neck:  supple with full rom and no masses or thyromegally, no JVD or carotid bruit  Chest Wall:  No deformities, masses, or tenderness noted. Lungs:  Normal respiratory effort, chest expands symmetrically. Lungs are clear to auscultation, no crackles or wheezes. not visibly sob at rest or exertion Heart:  Normal rate and regular rhythm. S1 and S2 normal without gallop, murmur, click, rub or other extra sounds. Abdomen:  Bowel sounds positive,abdomen soft and non-tender without masses, organomegaly or hernias noted. no renal bruits  Msk:  No deformity or scoliosis noted of thoracic or lumbar spine.   Pulses:  R and L carotid,radial,femoral,dorsalis pedis and posterior tibial pulses are full and equal bilaterally Extremities:  no cce  no  tenderness  neg homan's sign no palp cords  Neurologic:  sensation intact to light touch, gait normal, and DTRs symmetrical and normal.   Skin:  Intact without suspicious lesions or rashes Cervical Nodes:  No lymphadenopathy noted Inguinal Nodes:  No significant adenopathy Psych:  very anxious today- almost tearful  talks quickly - interrupts frequently with questions  difficult to communicate with today fair eye contact no SI   Impression & Recommendations:  Problem # 1:  ANXIETY STATE, UNSPECIFIED (ICD-300.00) Assessment Deteriorated I feel this is what's causing the perception of sob  nl HR and pulse ox and exam - is also not sob at rest or exertion  ekg showed some pvcs disc anx tx - she did cut her prozac herself-  ? perhaps that initiated she desired diff tx so will change to buspar 15   1/2 two times a day with lorazepam as needed disc situational stress in detail  I urged her to consider counseling -will let me know  sounds like  a bad social situation at home - but no danger  willf/u 1 mo - but alert me earlier if worse  spent 25 minutes face to face time with pt , over 50% of which was spent on counseling and coordination of care   The following medications were removed from the medication list:    Prozac 20 Mg Caps (Fluoxetine hcl) .Marland Kitchen... 1 by mouth two times a day Her updated medication list for this problem includes:    Buspirone Hcl 15 Mg Tabs (Buspirone hcl) .Marland Kitchen... 1/2 by mouth two times a day    Lorazepam 1 Mg Tabs (Lorazepam) .Marland Kitchen... 1/2 to 1 by mouth once daily as needed severe anxiety watch out for sedation  Problem # 2:  SHORTNESS OF BREATH (ICD-786.05) Assessment: New suspect from anxiety - no other findings  anemia is now under good control  obesity may also play role  see above if worse or not imp will proceed with further work up  Complete Medication List: 1)  Synthroid 112 Mcg Tabs (Levothyroxine sodium) .... Take one by mouth daily 2)  Estrace 2 Mg  Tabs (Estradiol) .... Take one half by mouth twice a day 3)  Allegra 180 Mg Tabs (Fexofenadine hcl) .... Take one by mouth daily 4)  Alllergy Injections  .... Injection q week 5)  Flonase Susp (Fluticasone propionate susp) .Marland Kitchen.. 1 spray in each nostril twice daily 6)  Zocor 40 Mg Tabs (Simvastatin) .Marland Kitchen.. 1 by mouth once daily 7)  Prosed Ds  .Marland KitchenMarland Kitchen. 1 by mouth up to 4 times daily as needed 8)  Vitamin D 1000 Unit Tabs (Cholecalciferol) .... Take 1 tablet by mouth once a day 9)  Prilosec 20 Mg Cpdr (Omeprazole) .... Take one 30-60 min before first and last meals of the day as long as coughing at all, then once daily before breakfast 10)  Vitamin E 400mg   .... Takes one tablet daily 11)  Omega-3 350 Mg Caps (Omega-3 fatty acids) .... Take 1 capsule by mouth once a day 12)  Glucosamine-chondroitin Caps (Glucosamine-chondroit-vit c-mn) .... Take 1 capsule by mouth once a day 13)  Nu-iron 150 Mg Caps (Polysaccharide iron complex) .... One capsule by mouth two times a day 14)  Celebrex 200 Mg Caps (Celecoxib) .Marland Kitchen.. 1 by mouth once daily 15)  Buspirone Hcl 15 Mg Tabs (Buspirone hcl) .... 1/2 by mouth two times a day 16)  Lorazepam 1 Mg Tabs (Lorazepam) .... 1/2 to 1 by mouth once daily as needed severe anxiety watch out for sedation  Patient Instructions: 1)  stop prozac tomorrow  2)  start buspar at 1/2 pill two times a day for anxiety  3)  if any side effects or problems please let me know (or if you feel worse) 4)  use lorazepam only if needed for severe anxiety  5)  follow up with me in 1 month 6)  if symptoms/ shortness of breath worsen please let me know  7)  you have a lot of stress- please let me know if you want to see a counselor  Prescriptions: LORAZEPAM 1 MG TABS (LORAZEPAM) 1/2 to 1 by mouth once daily as needed severe anxiety watch out for sedation  #15 x 0   Entered and Authorized by:   Judith Part MD   Signed by:   Judith Part MD on 04/03/2010   Method used:   Print then Give to  Patient   RxID:   [id number] BUSPIRONE HCL 15 MG TABS (BUSPIRONE HCL) 1/2  by mouth two times a day  #30 x 11   Entered and Authorized by:   Judith Part MD   Signed by:   Judith Part MD on 04/03/2010   Method used:   Print then Give to Patient   RxID:   [id number]    Orders Added: 1)  Est. Patient Level IV [14782]    Current Allergies (reviewed today): ! * ACHROMYCIN ! ALLOPURINOL ! CEPHALEXIN ! * TELFA PADS ! * MINOCYCLINE RELAFEN PENICILLIN SULFA * ASTROSPRAY CODEINE KEFLEX * MOBIC * AMBIEN CIPRO  Appended Document: trouble breathing/rbh     Allergies: 1)  ! * Achromycin 2)  ! Allopurinol 3)  ! Cephalexin 4)  ! * Telfa Pads 5)  ! * Minocycline 6)  Relafen 7)  Penicillin 8)  Sulfa 9)  * Astrospray 10)  Codeine 11)  Keflex 12)  * Mobic 13)  * Ambien 14)  Cipro  Physical Exam  Heart:  note - few early beats heard but overall regular rhythm   Complete Medication List: 1)  Synthroid 112 Mcg Tabs (Levothyroxine sodium) .... Take one by mouth daily 2)  Estrace 2 Mg Tabs (Estradiol) .... Take one half by mouth twice a day 3)  Allegra 180 Mg Tabs (Fexofenadine hcl) .... Take one by mouth daily 4)  Alllergy Injections  .... Injection q week 5)  Flonase Susp (Fluticasone propionate susp) .Marland Kitchen.. 1 spray in each nostril twice daily 6)  Zocor 40 Mg Tabs (Simvastatin) .Marland Kitchen.. 1 by mouth once daily 7)  Prosed Ds  .Marland KitchenMarland Kitchen. 1 by mouth up to 4 times daily as needed 8)  Vitamin D 1000 Unit Tabs (Cholecalciferol) .... Take 1 tablet by mouth once a day 9)  Prilosec 20 Mg Cpdr (Omeprazole) .... Take one 30-60 min before first and last meals of the day as long as coughing at all, then once daily before breakfast 10)  Vitamin E 400mg   .... Takes one tablet daily 11)  Omega-3 350 Mg Caps (Omega-3 fatty acids) .... Take 1 capsule by mouth once a day 12)  Glucosamine-chondroitin Caps (Glucosamine-chondroit-vit c-mn) .... Take 1 capsule by mouth once a day 13)   Nu-iron 150 Mg Caps (Polysaccharide iron complex) .... One capsule by mouth two times a day 14)  Celebrex 200 Mg Caps (Celecoxib) .Marland Kitchen.. 1 by mouth once daily 15)  Buspirone Hcl 15 Mg Tabs (Buspirone hcl) .... 1/2 by mouth two times a day 16)  Lorazepam 1 Mg Tabs (Lorazepam) .... 1/2 to 1 by mouth once daily as needed severe anxiety watch out for sedation  Other Orders: EKG w/ Interpretation (93000)   Orders Added: 1)  EKG w/ Interpretation [93000]      EKG  Procedure date:  04/03/2010  Findings:      rate of 75 3 PVCs noted  some stable Twave changes

## 2010-04-09 NOTE — Letter (Signed)
Summary: [facility]  [facility]   Imported By: Lanelle Bal 04/04/2010 14:18:27  _____________________________________________________________________  External Attachment:    Type:   Image     Comment:   External Document

## 2010-04-18 ENCOUNTER — Telehealth: Payer: Self-pay | Admitting: Family Medicine

## 2010-04-23 NOTE — Progress Notes (Signed)
Summary: not any better  Phone Note Call from Patient Call back at Home Phone [phone]   Caller: Patient Summary of Call: Pt states she is not any better, still short of breath.  Taking her buspar as directed.    What is next step?   Lowella Petties CMA, AAMA  April 18, 2010 2:44 PM   Follow-up for Phone Call        I will defer to Dr. Milinda Antis.  If her symptoms haven't gotten worse, then this should be okay to await her input. If she had significant sob, worse than prev, she needs ER eval.   Crawford Givens MD  April 18, 2010 4:42 PM.    Additional Follow-up for Phone Call Additional follow up Details #1::        Patient notified as instructed by telephone. Pt said she feels better now than when she called. Pt said if she walks a short distance she gets short of breath and feels very tired.Pt has dizziness on and off.(no dizziness now). Advised pt to go to ER for further evaluation. Pt said if she starts to feel worse again she will go to ER. Pt said she will also call our office with update on how she is doing tomorrow whether she has to go to ER or not. Pt understands Dr Milinda Antis will not be in office until Mon.Lewanda Rife LPN  April 17, 1476 4:53 PM     Additional Follow-up for Phone Call Additional follow up Details #2::    noted.  Crawford Givens MD  April 18, 2010 5:12 PM     Additional Follow-up for Phone Call Additional follow up Details #3:: Details for Additional Follow-up Action Taken: I agree with above -- MT   Additional Follow-up by: Judith Part MD,  April 19, 2010 6:36 AM

## 2010-04-25 LAB — GLUCOSE, CAPILLARY
Glucose-Capillary: 100 mg/dL — ABNORMAL HIGH (ref 70–99)
Glucose-Capillary: 101 mg/dL — ABNORMAL HIGH (ref 70–99)
Glucose-Capillary: 106 mg/dL — ABNORMAL HIGH (ref 70–99)
Glucose-Capillary: 107 mg/dL — ABNORMAL HIGH (ref 70–99)
Glucose-Capillary: 107 mg/dL — ABNORMAL HIGH (ref 70–99)
Glucose-Capillary: 108 mg/dL — ABNORMAL HIGH (ref 70–99)
Glucose-Capillary: 112 mg/dL — ABNORMAL HIGH (ref 70–99)
Glucose-Capillary: 114 mg/dL — ABNORMAL HIGH (ref 70–99)
Glucose-Capillary: 119 mg/dL — ABNORMAL HIGH (ref 70–99)
Glucose-Capillary: 119 mg/dL — ABNORMAL HIGH (ref 70–99)
Glucose-Capillary: 124 mg/dL — ABNORMAL HIGH (ref 70–99)
Glucose-Capillary: 126 mg/dL — ABNORMAL HIGH (ref 70–99)
Glucose-Capillary: 128 mg/dL — ABNORMAL HIGH (ref 70–99)
Glucose-Capillary: 142 mg/dL — ABNORMAL HIGH (ref 70–99)
Glucose-Capillary: 144 mg/dL — ABNORMAL HIGH (ref 70–99)
Glucose-Capillary: 145 mg/dL — ABNORMAL HIGH (ref 70–99)
Glucose-Capillary: 164 mg/dL — ABNORMAL HIGH (ref 70–99)
Glucose-Capillary: 87 mg/dL (ref 70–99)
Glucose-Capillary: 87 mg/dL (ref 70–99)
Glucose-Capillary: 87 mg/dL (ref 70–99)
Glucose-Capillary: 92 mg/dL (ref 70–99)
Glucose-Capillary: 92 mg/dL (ref 70–99)

## 2010-04-25 LAB — BASIC METABOLIC PANEL
BUN: 4 mg/dL — ABNORMAL LOW (ref 6–23)
BUN: 7 mg/dL (ref 6–23)
BUN: 7 mg/dL (ref 6–23)
BUN: 8 mg/dL (ref 6–23)
CO2: 27 mEq/L (ref 19–32)
CO2: 29 mEq/L (ref 19–32)
CO2: 30 mEq/L (ref 19–32)
CO2: 30 mEq/L (ref 19–32)
Calcium: 8.1 mg/dL — ABNORMAL LOW (ref 8.4–10.5)
Calcium: 8.1 mg/dL — ABNORMAL LOW (ref 8.4–10.5)
Calcium: 8.3 mg/dL — ABNORMAL LOW (ref 8.4–10.5)
Chloride: 102 mEq/L (ref 96–112)
Chloride: 103 mEq/L (ref 96–112)
Chloride: 104 mEq/L (ref 96–112)
Chloride: 106 mEq/L (ref 96–112)
Chloride: 108 mEq/L (ref 96–112)
Creatinine, Ser: 0.65 mg/dL (ref 0.4–1.2)
Creatinine, Ser: 0.66 mg/dL (ref 0.4–1.2)
Creatinine, Ser: 0.68 mg/dL (ref 0.4–1.2)
Creatinine, Ser: 0.84 mg/dL (ref 0.4–1.2)
Creatinine, Ser: 0.86 mg/dL (ref 0.4–1.2)
GFR calc Af Amer: >60 mL/min (ref >60)
GFR calc Af Amer: >60 mL/min (ref >60)
GFR calc Af Amer: >60 mL/min (ref >60)
GFR calc Af Amer: >60 mL/min (ref >60)
GFR calc non Af Amer: >60 mL/min (ref >60)
GFR calc non Af Amer: >60 mL/min (ref >60)
Glucose, Bld: 103 mg/dL — ABNORMAL HIGH (ref 70–99)
Glucose, Bld: 116 mg/dL — ABNORMAL HIGH (ref 70–99)
Potassium: 3.1 mEq/L — ABNORMAL LOW (ref 3.5–5.1)
Potassium: 3.6 mEq/L (ref 3.5–5.1)
Potassium: 4 mEq/L (ref 3.5–5.1)
Potassium: 4.2 mEq/L (ref 3.5–5.1)
Sodium: 135 mEq/L (ref 135–145)
Sodium: 138 mEq/L (ref 135–145)
Sodium: 141 mEq/L (ref 135–145)

## 2010-04-25 LAB — CBC
HCT: 22.2 % — ABNORMAL LOW (ref 36.0–46.0)
HCT: 23.6 % — ABNORMAL LOW (ref 36.0–46.0)
HCT: 23.8 % — ABNORMAL LOW (ref 36.0–46.0)
HCT: 24.6 % — ABNORMAL LOW (ref 36.0–46.0)
HCT: 32.9 % — ABNORMAL LOW (ref 36.0–46.0)
Hemoglobin: 10.7 g/dL — ABNORMAL LOW (ref 12.0–15.0)
Hemoglobin: 7.8 g/dL — ABNORMAL LOW (ref 12.0–15.0)
Hemoglobin: 7.9 g/dL — ABNORMAL LOW (ref 12.0–15.0)
Hemoglobin: 8.1 g/dL — ABNORMAL LOW (ref 12.0–15.0)
MCH: 27.1 pg (ref 26.0–34.0)
MCH: 27.2 pg (ref 26.0–34.0)
MCH: 27.5 pg (ref 26.0–34.0)
MCH: 27.6 pg (ref 26.0–34.0)
MCHC: 32.6 g/dL (ref 30.0–36.0)
MCHC: 32.8 g/dL (ref 30.0–36.0)
MCV: 82.7 fL (ref 78.0–100.0)
MCV: 82.7 fL (ref 78.0–100.0)
MCV: 83.6 fL (ref 78.0–100.0)
MCV: 84.7 fL (ref 78.0–100.0)
Platelets: 265 10*3/uL (ref 150–400)
Platelets: 302 10*3/uL (ref 150–400)
Platelets: 319 10*3/uL (ref 150–400)
RBC: 2.79 MIL/uL — ABNORMAL LOW (ref 3.87–5.11)
RBC: 2.84 MIL/uL — ABNORMAL LOW (ref 3.87–5.11)
RBC: 2.94 MIL/uL — ABNORMAL LOW (ref 3.87–5.11)
RBC: 3.64 MIL/uL — ABNORMAL LOW (ref 3.87–5.11)
RBC: 3.88 MIL/uL (ref 3.87–5.11)
RDW: 15.5 % (ref 11.5–15.5)
RDW: 15.8 % — ABNORMAL HIGH (ref 11.5–15.5)
RDW: 15.9 % — ABNORMAL HIGH (ref 11.5–15.5)
WBC: 13.3 10*3/uL — ABNORMAL HIGH (ref 4.0–10.5)
WBC: 14.4 10*3/uL — ABNORMAL HIGH (ref 4.0–10.5)
WBC: 16.7 10*3/uL — ABNORMAL HIGH (ref 4.0–10.5)
WBC: 9.3 10*3/uL (ref 4.0–10.5)
WBC: 9.6 10*3/uL (ref 4.0–10.5)

## 2010-04-25 LAB — CULTURE, RESPIRATORY W GRAM STAIN

## 2010-04-25 LAB — BLOOD GAS, ARTERIAL
Acid-Base Excess: 1 mmol/L (ref 0.0–2.0)
Acid-base deficit: 3.6 mmol/L — ABNORMAL HIGH (ref 0.0–2.0)
Bicarbonate: 25.1 mEq/L — ABNORMAL HIGH (ref 20.0–24.0)
Drawn by: 317871
Drawn by: 331761
FIO2: 0.4 %
FIO2: 0.5 %
O2 Saturation: 98.5 %
O2 Saturation: 98.6 %
PEEP: 5 cmH2O
Patient temperature: 98.6
Patient temperature: 98.6
Patient temperature: 98.6
RATE: 15 resp/min
RATE: 15 resp/min
pH, Arterial: 7.262 — ABNORMAL LOW (ref 7.350–7.400)
pO2, Arterial: 108 mmHg — ABNORMAL HIGH (ref 80.0–100.0)
pO2, Arterial: 119 mmHg — ABNORMAL HIGH (ref 80.0–100.0)

## 2010-04-25 LAB — URINALYSIS, ROUTINE W REFLEX MICROSCOPIC
Glucose, UA: NEGATIVE mg/dL
Hgb urine dipstick: NEGATIVE
pH: 5.5 (ref 5.0–8.0)

## 2010-04-25 LAB — CULTURE, BLOOD (ROUTINE X 2): Culture: NO GROWTH

## 2010-04-25 LAB — TSH: TSH: 2.271 u[IU]/mL (ref 0.350–4.500)

## 2010-04-25 LAB — CROSSMATCH
ABO/RH(D): A POS
Antibody Screen: NEGATIVE

## 2010-04-25 LAB — CARDIAC PANEL(CRET KIN+CKTOT+MB+TROPI): Troponin I: 0.01 ng/mL (ref 0.00–0.06)

## 2010-04-25 LAB — MRSA PCR SCREENING: MRSA by PCR: NEGATIVE

## 2010-04-25 LAB — TYPE AND SCREEN
ABO/RH(D): A POS
Antibody Screen: NEGATIVE

## 2010-04-25 LAB — PHOSPHORUS: Phosphorus: 2.3 mg/dL (ref 2.3–4.6)

## 2010-04-25 LAB — URINE CULTURE
Colony Count: NO GROWTH
Culture: NO GROWTH
Special Requests: NEGATIVE

## 2010-04-25 LAB — MAGNESIUM: Magnesium: 1.8 mg/dL (ref 1.5–2.5)

## 2010-04-26 LAB — COMPREHENSIVE METABOLIC PANEL
ALT: 20 U/L (ref 0–35)
BUN: 10 mg/dL (ref 6–23)
CO2: 28 mEq/L (ref 19–32)
Calcium: 9 mg/dL (ref 8.4–10.5)
Creatinine, Ser: 0.71 mg/dL (ref 0.4–1.2)
GFR calc non Af Amer: >60 mL/min (ref >60)
Glucose, Bld: 95 mg/dL (ref 70–99)
Sodium: 143 mEq/L (ref 135–145)

## 2010-04-26 LAB — SURGICAL PCR SCREEN
MRSA, PCR: NEGATIVE
Staphylococcus aureus: POSITIVE — AB

## 2010-04-26 LAB — CBC
HCT: 34.2 % — ABNORMAL LOW (ref 36.0–46.0)
MCH: 27.7 pg (ref 26.0–34.0)
MCHC: 32.7 g/dL (ref 30.0–36.0)
MCV: 84.6 fL (ref 78.0–100.0)
RDW: 16 % — ABNORMAL HIGH (ref 11.5–15.5)

## 2010-04-26 LAB — DIFFERENTIAL
Basophils Absolute: 0 10*3/uL (ref 0.0–0.1)
Lymphs Abs: 1.6 10*3/uL (ref 0.7–4.0)
Monocytes Relative: 17 % — ABNORMAL HIGH (ref 3–12)
Neutrophils Relative %: 61 % (ref 43–77)

## 2010-04-26 LAB — URINALYSIS, ROUTINE W REFLEX MICROSCOPIC
Bilirubin Urine: NEGATIVE
Hgb urine dipstick: NEGATIVE
Protein, ur: NEGATIVE mg/dL
Urobilinogen, UA: 0.2 mg/dL (ref 0.0–1.0)

## 2010-04-26 LAB — URINE MICROSCOPIC-ADD ON

## 2010-04-30 ENCOUNTER — Telehealth: Payer: Self-pay | Admitting: *Deleted

## 2010-04-30 NOTE — Telephone Encounter (Signed)
Yes -please put her in somewhere next week- thanks

## 2010-04-30 NOTE — Telephone Encounter (Signed)
Pt continues to be dizzy, light headed.  She wants appt to see you next week.  Please advise on whether or not you can work her in.

## 2010-05-04 ENCOUNTER — Encounter: Payer: Self-pay | Admitting: *Deleted

## 2010-05-04 ENCOUNTER — Encounter: Payer: Self-pay | Admitting: Family Medicine

## 2010-05-06 ENCOUNTER — Ambulatory Visit (INDEPENDENT_AMBULATORY_CARE_PROVIDER_SITE_OTHER): Payer: Medicare Other | Admitting: Family Medicine

## 2010-05-06 ENCOUNTER — Ambulatory Visit: Payer: BLUE CROSS/BLUE SHIELD | Admitting: Family Medicine

## 2010-05-06 ENCOUNTER — Encounter: Payer: Self-pay | Admitting: Family Medicine

## 2010-05-06 VITALS — BP 114/68 | HR 84 | Temp 97.9°F | Ht 65.0 in | Wt 228.2 lb

## 2010-05-06 DIAGNOSIS — D649 Anemia, unspecified: Secondary | ICD-10-CM

## 2010-05-06 DIAGNOSIS — F411 Generalized anxiety disorder: Secondary | ICD-10-CM

## 2010-05-06 DIAGNOSIS — J328 Other chronic sinusitis: Secondary | ICD-10-CM

## 2010-05-06 DIAGNOSIS — R0602 Shortness of breath: Secondary | ICD-10-CM

## 2010-05-06 MED ORDER — OMEPRAZOLE 20 MG PO CPDR: 20.0000 mg | DELAYED_RELEASE_CAPSULE | Freq: Two times a day (BID) | ORAL | Status: DC

## 2010-05-06 MED ORDER — FLUOXETINE HCL 20 MG PO CAPS: 20.0000 mg | ORAL_CAPSULE | Freq: Two times a day (BID) | ORAL | Status: DC

## 2010-05-06 NOTE — Assessment & Plan Note (Signed)
Pt seems to need abx for facial pain at almost every visit ? True sinusitis  Congestion and facial pain Known allergies with pollen high now Pt will make appt with her allergist for further eval - Dr Yachats Callas

## 2010-05-06 NOTE — Progress Notes (Signed)
Subjective:    Patient ID: Diamond Boyd, female    DOB: 11-Mar-1940, 70 y.o.   MRN: [id number]  HPI Here for anxiety and dizziness  bp and pulse are stable Wt is up 3 lb  Dizzy for a while - has to hold a wall when she walks  Coming and going - but does happen every day Ever since starting the buspar Anxiety may be just a tiny bit better - little jittery inside   Used to be on prozac -- perhaps go back on that   Cbc looks good  Lab Results  Component Value Date   WBC 5.6 11/23/2009   HGB 12.5 03/26/2010   HCT 37.9 03/26/2010   MCV 92.4 03/26/2010   PLT 206 03/26/2010     Heartburn is worse too - on one prilosec - needs to inc to 2   Nausea is better    Last visit changed from prozac to buspar 1/2 of 15 bid   She is on estrace for menopause   Is having sinus problems again ? Chronic sinusitis Gets allergy shots  Nose runs No colored drainage  No fever  Sinuses just hurt   Past Medical History  Diagnosis Date  . Colon polyps   . Depression   . Hypothyroidism   . Osteoarthritis     hands  . Peripheral vascular disease   . Degenerative disk disease     spine in center in past   . Cough   . Cancer     skin cancer- basal cell on arm / colon   . Lymphoma     Past Surgical History  Procedure Date  . Cholecystectomy   . Splenectomy     lymphoma  . Breast biopsy 1996  . Plantar fascia release   . Ventral hernia repair 1998  . Tendon release     Right thumb   . Colectomy 8/11    History   Social History  . Marital Status: Married    Spouse Name: N/A    Number of Children: 2  . Years of Education: N/A   Occupational History  . Retired     Social History Main Topics  . Smoking status: Never Smoker   . Smokeless tobacco: None  . Alcohol Use: No  . Drug Use: None  . Sexually Active: None   Other Topics Concern  . None   Social History Narrative   Chronically ill daughter lives with her- who also smokes and takes advantage of her. Does not  exercise     Family History  Problem Relation Age of Onset  . Coronary artery disease Mother   . Alcohol abuse Mother   . Cancer Father     jaw  . Diabetes Brother   . Fibromyalgia Daughter     chronic pain   . COPD Daughter         Review of Systems  Constitutional: Positive for fatigue. Negative for fever, activity change and appetite change.  HENT: Positive for congestion, rhinorrhea, postnasal drip and sinus pressure. Negative for ear pain, nosebleeds and tinnitus.   Eyes: Positive for itching. Negative for discharge and visual disturbance.  Respiratory: Positive for shortness of breath and wheezing. Negative for cough.   Cardiovascular: Negative.   Gastrointestinal: Negative for nausea and abdominal pain.  Genitourinary: Negative for urgency and frequency.  Musculoskeletal: Positive for arthralgias.  Skin: Negative for color change and pallor.  Neurological: Positive for dizziness, light-headedness and headaches. Negative for syncope and  weakness.  Hematological: Negative for adenopathy. Does not bruise/bleed easily.  Psychiatric/Behavioral: Negative for suicidal ideas and dysphoric mood. The patient is nervous/anxious.        Objective:   Physical Exam  Constitutional: She appears well-developed and well-nourished.       overwt and well appearing   HENT:  Head: Normocephalic and atraumatic.  Right Ear: External ear normal.  Left Ear: External ear normal.       Nares dry and clear  Face tender over maxillary sinuses  Eyes: Conjunctivae and EOM are normal. Pupils are equal, round, and reactive to light.  Neck: Neck supple. No JVD present. Carotid bruit is not present. No thyromegaly present.  Cardiovascular: Normal rate, regular rhythm and normal heart sounds.   Pulmonary/Chest: Effort normal.  Abdominal: Soft. Bowel sounds are normal. She exhibits no mass. There is no tenderness.       Scars noted No renal bruits   Musculoskeletal:       Wearing supp hose    Lymphadenopathy:    She has no cervical adenopathy.  Neurological: She is alert. She has normal reflexes. No cranial nerve deficit.  Skin: Skin is warm and dry.  Psychiatric: She has a normal mood and affect.       slt anxious is baseline          Assessment & Plan:

## 2010-05-06 NOTE — Patient Instructions (Signed)
When you go for your next allergy shot-- make an appt to see your allergist for facial pain / sinus congestion  We will increase prozac to 20 mg twice daily- update me if dizziness does not get better Increase your prilosec to twice daily as well and update me if acid reflux does not go away  Follow up with me in about 2 months please

## 2010-05-06 NOTE — Assessment & Plan Note (Signed)
buspar did not work well and caused dizziness and headaches  Will go back to prozac 20 mg bid - which worked in the past  Med changed and f/u in 2 months  Adv to update if any problems

## 2010-05-06 NOTE — Assessment & Plan Note (Signed)
Still on iron with hb over 12 now- doing well  Pt will ask hematologist when she can stop it

## 2010-05-27 ENCOUNTER — Encounter (HOSPITAL_BASED_OUTPATIENT_CLINIC_OR_DEPARTMENT_OTHER): Payer: Medicare Other | Admitting: Oncology

## 2010-05-27 ENCOUNTER — Other Ambulatory Visit (HOSPITAL_COMMUNITY): Payer: Self-pay | Admitting: Oncology

## 2010-05-27 DIAGNOSIS — C8589 Other specified types of non-Hodgkin lymphoma, extranodal and solid organ sites: Secondary | ICD-10-CM

## 2010-05-27 DIAGNOSIS — D649 Anemia, unspecified: Secondary | ICD-10-CM

## 2010-05-27 DIAGNOSIS — C189 Malignant neoplasm of colon, unspecified: Secondary | ICD-10-CM

## 2010-05-27 LAB — CBC WITH DIFFERENTIAL/PLATELET
Basophils Absolute: 0.1 10*3/uL (ref 0.0–0.1)
EOS%: 1.7 % (ref 0.0–7.0)
HGB: 13.8 g/dL (ref 11.6–15.9)
MCH: 32 pg (ref 25.1–34.0)
MCV: 95.4 fL (ref 79.5–101.0)
MONO%: 10.9 % (ref 0.0–14.0)
RBC: 4.31 10*6/uL (ref 3.70–5.45)
RDW: 14.8 % — ABNORMAL HIGH (ref 11.2–14.5)

## 2010-05-30 LAB — COMPREHENSIVE METABOLIC PANEL
ALT: 16 U/L (ref 0–35)
AST: 16 U/L (ref 0–37)
Alkaline Phosphatase: 70 U/L (ref 39–117)
Creatinine, Ser: 0.81 mg/dL (ref 0.40–1.20)
Total Bilirubin: 0.6 mg/dL (ref 0.3–1.2)

## 2010-05-30 LAB — TRANSFERRIN RECEPTOR, SOLUABLE: Transferrin Receptor, Soluble: 0.75 mg/L — ABNORMAL LOW (ref 0.76–1.76)

## 2010-05-30 LAB — CEA: CEA: 1.8 ng/mL (ref 0.0–5.0)

## 2010-05-30 LAB — IRON AND TIBC
TIBC: 323 ug/dL (ref 250–470)
UIBC: 194 ug/dL

## 2010-05-31 ENCOUNTER — Other Ambulatory Visit: Payer: Self-pay | Admitting: *Deleted

## 2010-05-31 MED ORDER — POLYSACCHARIDE IRON COMPLEX 150 MG PO CAPS: 150.0000 mg | ORAL_CAPSULE | Freq: Two times a day (BID) | ORAL | Status: DC

## 2010-06-04 ENCOUNTER — Ambulatory Visit: Payer: BLUE CROSS/BLUE SHIELD | Admitting: Family Medicine

## 2010-06-07 ENCOUNTER — Ambulatory Visit: Payer: BLUE CROSS/BLUE SHIELD | Admitting: Family Medicine

## 2010-06-17 ENCOUNTER — Other Ambulatory Visit: Payer: Self-pay | Admitting: Family Medicine

## 2010-06-17 DIAGNOSIS — Z1231 Encounter for screening mammogram for malignant neoplasm of breast: Secondary | ICD-10-CM

## 2010-06-28 NOTE — Procedures (Signed)
[facility]  Patient:    Diamond Boyd, BURGERT Visit Number: [id number] MRN: [id number]          Service Type: MED Location: [phone] Attending Physician:  Carmin Muskrat Dictated by:   Danice Goltz, M.D. Proc. Date: 02/11/01 Admit Date:  02/08/2001   CC:         Samul Dada, M.D.   Procedure Report  PROCEDURE:  Video bronchoscopy.  DATE OF BIRTH:  1940/05/07  INDICATIONS:  Ms. Evangelist is a 70 year old white female with a history of lymphoma, status post fludarabine therapy, who presented with bilateral infiltrates of unknown etiology.  Bronchoscopy is being done for evaluation of potential infectious process.  The patient had the procedure explained in detail, and she agreed to proceed with the same.  DESCRIPTION OF PROCEDURE:  The patient was taken to the endoscopy suite in the cardiopulmonary section of [facility]  On initiation of conscious sedation, it was noted the patient had saturations of 89% on 5 liters of nasal cannula O2.  This was going to make the procedure a little bit more risky.  Patient was given Versed 5 mg and fentanyl 75 mcg for conscious sedation.  She had 2% lidocaine used for a topical anesthesia.  The Pentax video bronchoscope was then advanced via the oral route, and the airway was thoroughly examined.  The vocal cords were normal.  Bronchoscope was advanced through the vocal cords.  Trachea was normal.  Carina was sharp. No inflammatory changes were noted.  The patient was also noted to have normal right and left tracheobronchial trees.  At the moment of wedging the bronchoscope to try to obtain right lower lobe biopsies, the patient had difficulties maintaining saturations despite increasing FIO2.  For this reason, the procedure was then limited to washings of the right middle lobe for lavage.  Bronchoalveolar lavage was then done with 60 cc of normal saline to the right middle  lobe with yield of 25 cc of bronchoalveolar lavage.  This was sent to pathology and microbiology.  Bronchoscope was then retrieved.  The patients saturations started to improve after that, and she was then sent to the recovery area without untoward side effect.  IMPRESSION:  Lymphoma with bilateral infiltrates with no endobronchial lesions noted.  PLAN:  Await results of the bronchoalveolar lavage.  Unfortunately, biopsies could not be obtained due to the patients severe desaturation.  Overall, the patient tolerated the procedure well without undue complications. Dictated by:   Danice Goltz, M.D. Attending Physician:  Carmin Muskrat DD:  02/16/01 TD:  02/16/01 Job: [id number]

## 2010-06-28 NOTE — H&P (Signed)
[facility]  Patient:    Diamond Boyd, Diamond Boyd Visit Number: [id number] MRN: [id number]          Service Type: MED Location: [phone] Attending Physician:  Carmin Muskrat Dictated by:   Rosemarie Ax, N.P. Admit Date:  02/08/2001                           History and Physical  HISTORY OF PRESENT ILLNESS:  Diamond Boyd is a 70 year old female from [place], [place], diagnosed with low-grade lymphoma marginal zone in 1994. She has been followed conservatively and in 1996 underwent a splenectomy because of pancytopenia.  The course at that time was complicated by hernia repair followed by a splenectomy.  She subsequently has had recent scans in November 2002, which noted increased adenopathy in the abdomen.  In the early part of December, she was treated with fludarabine given on four consecutive days, and she tolerated this well.  Unfortunately, around the 23rd of December she became quite fatigued with a low-grade fever of 99 degrees.  At that time, she was admitted to the hospital on December 23 with a temperature of 102.4, chills, headache, and sweats.  She had no cough at that time.  She did have some right flank pain and right flank tenderness.  She did deny dysuria, photophobia, or neck stiffness.  She was seen in the emergency room and admitted at that time.  She was discharged on December 25 with the following discharge diagnosis:  Fever and headache, marginal zone lymphoma with villous lymphocytes, Stage IV, with bone marrow and peripheral blood involvement.  She also has a history of hemolytic anemia.  During her admission, she had an MRI of the brain which was essentially negative.  Her O2 saturations during that period of time were 95%.  A clear-cut diagnosis for her fever and headaches was not found.  After a couple of days on the Cipro, her fever did resolve; however, she did begin to break out in a rash, primarily on her  extremities with a fine rash on her trunk.  She also had some shortness of breath while lying in bed and even while talking.  She also complained of some burning chest discomfort, worse with lying down.  Chest discomfort was relieved with sitting up.  Today, she presents to the office with increased headache and increased shortness of breath.  O2 saturations ranged from 85-89% on room air.  PAST MEDICAL HISTORY: 1. Goiter. 2. Hypothyroidism. 3. Hyperlipidemia. 4. Anxiety. 5. Depression. 6. Anemia. 7. Irritable bowel syndrome. 8. History of lupus anticoagulant, September 1996. 9. Marginal zone lymphoma, diagnosed 1994.  PAST SURGICAL HISTORY: 1. Splenectomy in October 1996. 2. Hysterectomy. 3. Benign breast cyst, removed.  SOCIAL HISTORY:  She is married, lives in [place], and works as a Clinical cytogeneticist.  She and her husband have two children.  FAMILY HISTORY:  Father had head and neck cancer.  All family members have had cardiac problems.  ALLERGIES:  Multiple and include:  ______ SPRAY, KEFLEX, PENICILLIN, SULFA, CODEINE, RELAFEN, CEPHALEXIN, MYCINS.  CURRENT MEDICATIONS:  1. Prozac 20 mg 1 p.o. q.d.  2. Prevacid 30 mg 1 p.o. q.d.  3. Synthroid 100 mcg 1 p.o. q.d.  4. Vioxx 25 mg 1 p.o. q.d.  5. Urised q.i.d.  6. Allopurinol 300 mg 1 p.o. q.d.  7. Flonase 1 spray each nostril b.i.d.  8. Claritin 10 mg 1 p.o. q.d.  9. Zocor 40  mg 1 p.o. q.d. 10. Estradiol 2 mg 1/2 tablet b.i.d. 11. Triamterene/hydrochlorothiazide 37.5/25 1 p.o. q.d.  REVIEW OF SYSTEMS:  Patient was noted to have a fever of 101.4 last night. She has had frequent nausea which is increased today.  She did vomit last p.m. which her husband described as a foamy liquid.  Appetite has decreased.  She has also had pain in her legs and headache.  The pain in her legs has been quite generalized.  She has had increased fatigue, increased weakness.  She denies any urinary tract symptoms.  She also has had  frequent headaches, shortness of breath worse with any type of exertion.  Has occasional indigestion and some abdominal fullness.  PHYSICAL EXAMINATION:  VITAL SIGNS:  This is a 70 year old white female who presents to the office today with frequent cough, O2 saturations at 85-88% on room air.  Weight is 214; that is down 5 pounds from three days ago.  Blood pressure is 121/80, temperature 98.3, pulse 108, respirations 28.  Labs are pending.  HEENT:  Sclera is slightly injected.  Oropharynx is clear.  LUNGS:  Soft, grating rales in both bases.  Cough is frequent, nonproductive cough, frequent with any deep inspiration.  CHEST:  There is a fine rash over the chest primarily over the breasts. Almost no rash on the back.  CARDIOVASCULAR:  Tachycardic at 110-116.  No gallop, no murmur.  ABDOMEN:  Obese, unable to palpable liver border.  EXTREMITIES:  An erythematous papular macular rash on both upper extremities, slightly less on the lower extremities.  Trace pedal edema.  No calf tenderness.  NEUROLOGIC:  Alert and oriented x 3.  Strength is 5/5.  Chest x-ray shows slight increase in questionable infiltrate with small, bilateral pleural effusions.  IMPRESSION AND PLAN:  Diamond Boyd is a pleasant woman with marginal zone lymphoma.  On flow cytometry data in November, she has progressive disease and has received fludarabine.  She now presents posthospitalization from December 23 through December 25, again with fever, now with frequent, nonproductive cough, and increased markings in both lower lobes upon x-ray.  We are concerned at this time for possibly atypical pneumonia versus cardiovascular problems.  Will order a 2-D echocardiogram and O2.  We will stop her allopurinol and request a pulmonary consult to further evaluate this increasing shortness of breath. Dictated by:   Rosemarie Ax, N.P. Attending Physician:  Carmin Muskrat DD:  02/08/01 TD:  02/08/01 Job:  [id number]

## 2010-06-28 NOTE — Procedures (Signed)
[facility]  Patient:    Diamond Boyd, Diamond Boyd Visit Number: [id number] MRN: [id number]          Service Type: MED Location: [phone] Attending Physician:  Carmin Muskrat Dictated by:   Candy Sledge, M.D. Proc. Date: 02/09/01 Admit Date:  02/08/2001                             Procedure Report  PROCEDURE:  Lumbar puncture.  INDICATION:  Rule out meningitis versus CNS lymphoma.  DESCRIPTION OF PROCEDURE:  After sterile preparation and local anesthesia, the L3-4 interspace, a 20 gauge spinal needle was introduced with minimal difficulty producing clear CSF and an opening pressure of 140 mm of H2O. Approximately 10 cc were collected for routine studies and cytology.  The patient tolerated the procedure well and without immediate complications. Dictated by:   Candy Sledge, M.D. Attending Physician:  Carmin Muskrat DD:  02/09/01 TD:  02/10/01 Job: [id number]

## 2010-06-28 NOTE — H&P (Signed)
[facility]  Patient:    Diamond Boyd, Diamond Boyd Visit Number: [id number] MRN: [id number]          Service Type: Attending:  Pierce Crane, M.D. Dictated by:   Pierce Crane, M.D. Adm. Date:  02/01/01                           History and Physical  PROBLEM:  Fever, history of marginal zone lymphoma, status post chemotherapy.  IDENTIFICATION:  Diamond Boyd is a 70 year old white female from [place], [place].  HISTORY OF PRESENT ILLNESS:  This woman was diagnosed with low-grade lymphoma, likely marginal zone, question of ______ splenic lymphoma in 1994.  She was followed conservatively and then subsequently in 1996, underwent splenectomy because of pancytopenia.  She recovered from this.  The course was complicated by hernia repair following a splenectomy.  She subsequently had recent scans in November which were noted for increasing adenopathy in the abdomen. Because of this, she was started on chemotherapy in the early part of this month with fludarabine given for four consecutive days.  She tolerated this well.  She has been fatigued over the past day or two.  Yesterday, she had a low-grade temperature of 99 degrees.  Today, she noticed a temperature of 102.4 degrees Fahrenheit with associated chills, headache and sweats.  She denies cough.  She has noted some right flank tenderness over the past day as well.  She denies dysuria, photophobia, neck stiffness.  She has really no other complaints and feels otherwise well.  She was seen in the emergency room for this reason.  PAST MEDICAL HISTORY:  As noted.  She has a history of goiter and hypothyroidism, history of hyperlipidemia and anxiety.  PAST SURGICAL HISTORY:  Past surgical history is also significant for splenectomy, hysterectomy and breast cyst removal.  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  FAMILY MEDICAL HISTORY:   Father had a head and neck type of cancer.  REVIEW OF SYSTEMS:  Really noncontributory apart from what has been described.   PHYSICAL EXAMINATION:  GENERAL:  A pleasant woman looking stated age, somewhat flushed.  VITAL SIGNS:  Blood pressure is 153/77, pulse is 114, respiratory rate is 20, temperature is 98.6, pulse 105.  HEAD AND NECK:  Oropharynx is normal.  Trachea midline.  She does have a goiter.  Pupils reactive to light and accommodation.  No scleral icterus. Extraocular movements are normal.  LUNGS:  She has good air entry bilaterally.  No rales are heard.  CARDIAC:  Normal heart sounds.  No murmurs appreciated.  She has no appreciable cervical or supraclavicular adenopathy.  ABDOMEN:  Soft, benign.  No rebound.  No tenderness.  She has an incision, status post splenectomy.  No right upper quadrant tenderness.  She does have some right flank tenderness on palpation.  EXTREMITIES:  There is no actual cyanosis, clubbing or edema.  LABORATORY STUDIES:  White count is 15.6, absolute neutrophil count of 9.2, hemoglobin 12.6, platelet count of 370,000.  Potassium 3.1, sodium is 133, creatinine 0.8.  LFTs:  AST is 48, ALT 57, alkaline phosphatase 226, bilirubin 0.7.  Urinalysis appears negative.  Chest x-ray does not show any obvious infiltrate.  There is a lot of crowding of markings.  In the lateral view, there is a question of a possible infiltrate in the lower lobes.  IMPRESSION AND PLAN:  Diamond Boyd is a pleasant woman who has a history of what appears to be a marginal zone lymphoma based on flow cytometry data obtained in November.  She has progressive disease, has received fludarabine and now presents with fever with a normal white count.  Plan is to bring her to the hospital and start on empiric antibiotics.  She has multiple allergies to multiple different antibiotics including Septra, penicillin, Keflex, Relafen.  We will start her on Cipro 400 mg  intravenously b.i.d.  Blood cultures have also been obtained.  If flank pain persists, she may require an ultrasound or intravenous pyelogram. Dictated by:   Pierce Crane, M.D. Attending:  Pierce Crane, M.D. DD:  02/01/01 TD:  02/01/01 Job: [id number]

## 2010-06-28 NOTE — Discharge Summary (Signed)
[facility]  Patient:    Diamond Boyd, Diamond Boyd Visit Number: [id number] MRN: [id number]          Service Type: MED Location: 4E 0431 01 Attending Physician:  Carmin Muskrat Dictated by:   Samul Dada, M.D. Admit Date:  02/08/2001 Discharge Date: 02/17/2001   CC:         Diamond Boyd, M.D.  Diamond Boyd, M.D.   Discharge Summary  DISCHARGE DIAGNOSES: 1. Bilateral pneumonitis associated with respiratory insufficiency felt to be    secondary to fludarabine. 2. Marginal zone lymphoma, stage IV, recurrent with peripheral blood    involvement. 3. Multiple drug allergies including sulfa and penicillin. 4. Rash probably secondary to allopurinol. 5. Hypokalemia. 6. History of goiter. 7. History of hypothyroidism. 8. History of hyperlipidemia. 9. History of anxiety and depression.  PROCEDURES:  CT scan of the chest carried out prior to admission on February 05, 2001.  Lumbar puncture on February 09, 2001, two-dimensional echocardiogram February 09, 2001.  MRI of the brain February 02, 2001. Fiberoptic bronchoscopy, February 11, 2001.  HISTORY OF PRESENT ILLNESS:  Diamond Boyd is a 70 year old white married female who was admitted to [facility] after being evaluated in the cancer center for history of ongoing illness characterized by shortness of breath, headache, fever, an abnormal chest x-ray suggesting congestive heart failure, and O2 saturations that were running in the mid to high 80s on room air. Ms. Diamond Boyd had been admitted to [facility] from December 23 through December 25 because of the same illness which seemed to be resolving.  She was admitted with a fever and headache primarily.  There was no history of any respiratory symptoms at that time although the chest x-ray was slightly abnormal with enlargement of the heart and small bilateral pleural effusions. Patient was placed on intravenous Cipro and seemed to be  recovering.  She remained afebrile during that brief admission.  She was improved and sent home on December 25.  However, her condition within a couple days seemed to worsen progressively resulting in her ultimate admission to the hospital.  The patients history is remarkable for a diagnosis of marginal zone lymphoma with circulating villous lymphocytes diagnosed in 1994.  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  The patient had been seen within the past couple of months for routine follow-up and, even though she was asymptomatic on that visit, she was found to have marked elevation of her white count with lymphocytosis and circulating villous lymphocytes.  CT scan of the abdomen and pelvis showed lymphadenopathy.  The patient was treated with fludarabine during the early part of December and seemed to tolerate her treatment well.  She had a nice response with a drop of her white count to near normal levels.  It should also be mentioned that patient had also developed a rash as part of this illness. She had been on allopurinol and this had been discontinued recently.  It was suspected that her rash could in fact be secondary to allopurinol.  PHYSICAL EXAMINATION:  VITAL SIGNS:  She had a dry cough, O2 saturation on room air of 85 to 88%. Weight was 214 pounds representing a 5 pound weight loss.  Vital signs were otherwise normal.  She was afebrile at that time.  Respirations were slightly labored at 28.  HEENT:  There was no scleral icterus.  Mouth and pharynx were benign.  LUNGS:  She had soft rales at  both bases and had a tendency to cough whenever she took a deep breath.  HEART:  Rhythm was regular with a tachycardia.  Initially, no murmur was heard.  ABDOMEN:  Obese with no organomegaly or masses palpable.  EXTREMITIES:  No peripheral edema.  She had a very prominent erythematous rash over her arms and legs with a lesser  degree of involvement of her trunk.  NEUROLOGIC:  Exam was normal without focal findings or alteration of mental status.  LABORATORY DATA:  On admission, white count 28.4, hemoglobin 12.4, hematocrit 36.0, platelets 480,000.  There were 62% polys, 25% lymphs.  White count got up to 50.1 on January 6 with a hemoglobin of 11.9.  On the day of discharge, the white count was 32.8, hemoglobin 12.2, hematocrit 36.6, platelets 684,000. There were 57% neutrophils, 38% lymphs, 9% monocytes, 1% basophils.  Arterial blood gases on February 09, 2001, on room air - pH 7.476, PCO2 36.8, PO2 42.3. On February 15, 2001, on room air, pH was 7.42, PCO2 40.5, PO2 53.2.  Sed rate on January 1 was 46.  PT and PTT as well as the bleeding time were all normal. Bleeding time was 4.5 minutes.  Initial electrolytes - sodium 130, potassium 4.0, subsequently 3.1.  Renal function was normal.  Albumin initially 3.1, subsequently 2.7 at the time of discharge.  Patient had an elevated alkaline phosphatase, initially 315, subsequently at discharge 156.  LDH was normal. Potassium at the time of discharge was 4.5, sodium 135.  CSF showed no significant cells.  Protein was 24, glucose 61.  VDRL on the CSF was nonreactive.  Cryptococcal antigen was negative.  Multiple blood cultures were negative.  Urinalysis was negative.  Urine for Legionella was negative. Bronchoscopy also yielded a negative workup for AFB, Legionella, Pneumocystis. Helper T cell, i.e., CD4, was 1210 with normal being 400 to 2700.  The percentage of the T cell was 12% with normal being 33 to 55.  Antibody titers for influenza were negative.  HSV #1 IgG was elevated; however, HSV #1 IgM titers were negative.  Multiple chest x-rays showed borderline heart enlargement with a diffuse interstitial opacity suggesting pulmonary edema. There were small bilateral pleural effusions.  A CT scan that had been carried  out prior to admission on February 05, 2001,  failed to show pulmonary emboli utilizing pulmonary embolism protocol.  CT showed alveolar opacity and small bilateral pleural effusions.  MRI of the brain February 02, 2001, showed a 1.5 cm x 5 mm expansile nonenhancing prominence at the level of the left posterior frontal convexity.  This appeared to be emanating from within the diploic space, possibly a developmental abnormality.  However, the possibility of lymphomatous infiltration could not be entirely excluded.  CSF cytology showed rare lymphocytes.  Bronchial washings showed no malignant cells. Patient did have bronchoalveolar lavage.  She did not have any biopsies because of desaturation during the procedure.  A 2-D echocardiogram on February 09, 2001, showed normal left ventricular size and function with a left ventricular ejection fraction of 55 to 65%.  No significant pericardial fluid was seen.  HOSPITAL COURSE:  Ms. Holtrop was admitted to the hospital after being evaluated at the [facility].  She was seen in consultation by Dr. Danice Boyd who was extraordinarily helpful in coordinating the patients workup and treatment.  It should be noted that the beta ______ protein that had been done prior to admission came back in the 600 range suggesting the possibility of myocarditis, possibly  viral in nature.  Patient initially had low-grade fever, subsequently was afebrile.  She remained dependent on oxygen, anywhere from 2 to 3 L per minute, to maintain satisfactory O2 saturations in the 92 to 95% range.  She was extremely limited in her activities, was essentially confined to bed to chair because of dyspnea.  Course in the hospital during her diagnostic procedures was one of improvement.  She had persistent severe headache which necessitated an MRI of the brain, neurologic evaluation with Dr. Fransisca Connors, and a spinal tap, all of which were negative.  Subsequently, over the next few days, her headache  improved and ultimately resolved.  It was decided not to treat the patient with antibiotics since we are unsure of the diagnosis.  Patients condition really did not change significantly after the first couple of days. The x-ray did look a little worse and, then, gradually improved particularly noticeable after the patient was started on high dose intravenous corticosteroids following her bronchoscopy.  Her rash was felt to be due to allopurinol and this, too, abated.  This was associated with some pruritus. It was suspected that, following the patients negative bronchoalveolar lavage, her pneumonitis might be secondary to fludarabine.  This had been considered in the differential diagnosis.  She was started on intravenous Solu-Medrol initially 125 mg for one dose, then subsequently 80 mg IV every 8 hours.  This was continued until January 6.  Chest x-ray showed improvement and the patient at that point was switched to prednisone 50 mg b.i.d.  She had been covered with Lovenox prophylactically.  Patient continued to improve during the last few days and, at the time of discharge, was able to walk around the unit and maintain O2 saturation on room air of around 95%.  Her chest x-ray showed improvement and, clinically, she was dramatically improved. She never had any significant hyperglycemia on the high dose prednisone.  She is being discharged today on planned tapering schedule of prednisone.  She will be discharged initially on 60 mg with 20 mg pills.  She will take 60 mg daily for four days, subsequently 40 mg daily for four days, 20 mg daily for four days, and then 10 mg daily.  She was given a prescription for Lasix 20 mg to take one or two as needed a couple of times a week for some swelling of the ankles.  This was a problem in the hospital that responded well to a single dose of Lasix 40 mg IV.  Additional medicines are Prozac 20 mg daily, Prevacid 30 mg q.d., Synthroid 100 mcg q.d.,  Vioxx 25 mg q.d., Urised one pill q.i.d., Flonase nasal spray b.i.d., Claritin 10 mg daily, Zocor 40 mg q.d., estradiol 1 mg b.i.d.  She will have activity as tolerated.  Diet should be low salt. As stated, she is being discharged in a markedly improved condition.  She will be having a follow-up appointment with Dr. Jayme Cloud on January 16 and should see me the week of January 20.  We will need to give consideration to further therapy for her non-Hodgkins lymphoma.  I am thinking about Rituxan in combination with a CHOP-type regimen.  We will assume that she is allergic to allopurinol and Fludarabine and avoid those drugs.  She has a long list of drug intolerances which include Keflex, penicillin, sulfa, codeine, Relafen, cephalexin, and mycins. Dictated by:   Samul Dada, M.D. Attending Physician:  Carmin Muskrat DD:  02/17/01 TD:  02/17/01 Job: [id number]

## 2010-06-28 NOTE — Discharge Summary (Signed)
[facility]  Patient:    PIYA, MESCH Visit Number: [id number] MRN: [id number]          Service Type: MED Location: 4E 0401 02 Attending Physician:  Carmin Muskrat Dictated by:   Samul Dada, M.D. Admit Date:  02/01/2001 Discharge Date: 02/03/2001   CC:         [facility]   Discharge Summary  DATE OF BIRTH:  December 27, 1940  DISCHARGE DIAGNOSES: 1. Admission for fever and headaches. 2. Marginal zone lymphoma with villous lymphocytes, stage 4, with bone marrow    and peripheral blood involvement, date of onset 1994, with recurrence,    November 2002, and recent chemotherapy with fludarabine given on January 11, 2001, through January 14, 2001. 3. History of hemolytic anemia, September 1996. 4. History of lupus anticoagulant, September 1996. 5. History of hypothyroidism. 6. History of hyperlipidemia. 7. History of depression. 8. History of irritable bowel syndrome. 9. 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  PROCEDURES:  MRI of the brain.  HISTORY OF PRESENT ILLNESS:  Diamond Boyd is a 70 year old white married female from [place] who presented to the [facility] emergency room in the early hours of February 01, 2001, with fever, weakness, malaise, nausea, and headache.  Fever had gotten up to 102 with chills.  There were no respiratory symptoms.  She did have some flank pain.  Patient has problems as outlined above, specifically a history of non-Hodgkins lymphoma status post chemotherapy approximately three weeks prior to admission and, for those reasons, admission to the hospital was felt to be indicated and necessary.  PHYSICAL EXAMINATION:  VITAL SIGNS:  Patients recorded temperature initially was 98.6 from the ER sheet.  Vital signs were notable for a blood pressure of 153/77, pulse 114. No respiratory distress.  O2 saturations were normal.  There was no  peripheral adenopathy palpable.  HEENT:  No scleral icterus or oral pathology.  LUNGS:  Clear.  CARDIAC:  Exam was without murmur or rub.  No peripheral adenopathy.  ABDOMEN:  Benign except for some right flank tenderness on palpation.  EXTREMITIES:  No peripheral edema or clubbing.  LABORATORY DATA:  Notable for white count of 15.7, with 60% polys, hemoglobin 12.8, hematocrit 38.2, platelets 380,000.  On December 25, the day of discharge, the white count was 11.9 with 38% neutrophils, hemoglobin 11.3, hematocrit 33.2, platelets 389,000.  Chemistries were notable for sodium of 133, potassium 3.1, glucose 154, albumin 3.6, slightly elevated liver function tests with an AST of 48, ALT of 57, and an alkaline phosphatase of 226.  LDH was 141.  The patient has had an elevated alkaline phosphatase noted in the past of uncertain etiology.  Urinalysis was negative as was the culture.  Two sets of blood cultures drawn in the emergency room were without growth.  Chest x-ray showed some prominent markings at the base but no evidence of pneumonia. The possibility of bronchitis could not be excluded.  On February 02, 2001, patient underwent an MRI of the brain with gadolinium because of persistent headaches requiring narcotics while she was hospitalized.  There were abnormalities within the brain substance; however, there was an approximately 1.5 x 3 mm extra-axial soft tissue prominence, nonenhancing, overlying the left posterior frontal convexity, possibly subdural or epidural in location. Differential considerations would include lymphomatous involvement versus an atypical meningioma.  Labs on December 25 showed a potassium of 4.1 after oral replacement.  Glucose was 108, alkaline phosphatase 253, SGOT 37, SGPT 44, albumin 3.0.  HOSPITAL COURSE:  As stated, Ms. Byington was admitted to [facility] via the emergency room in the early hours of December 23 with the above problems.  Her course in  the hospital was one of gradual improvement.  She was started on IV Cipro 400 mg b.i.d.  She was given Percocet for her headaches.  Shortly after admission, temperature of 100.1 was recorded.  Thereafter, she remained entirely afebrile.  O2 saturations on room air were 95%.  She remained lethargic with malaise and generalized weakness.  She remained with headaches which gradually improved although she required narcotics for her pain.  She was able to eat, had no nausea, vomiting, diarrhea.  As stated, all cultures were negative.  Today, on the day of discharge, February 03, 2001, she is satisfactorily improved but not 100% improved.  She still has a headache.  I will be stopping her Cipro given the negative cultures and the presumption that we are dealing with probably a nonspecific viral infection.  I am giving Ms. Pattison a prescription for Vicodin to take a half to one pill every 4 hours as needed, #20, with no refills.  She will resume her usual medicines that she had been taking prior to admission.  Ms. Faughnan missed her appointment with me and we will need to work her in within the next couple of days to reevaluate her and to set up her next course of chemotherapy.  I should mention that she has had a very nice response to chemotherapy.  Prior to the fludarabine that was given on January 11, 2001, her white count was 86.4, with an ANC of 5.9.  Thus, the majority of these cells were her abnormal lymphocytes, many of which showed villous projections.  Patient also had CT scan that showed adenopathy.  This has not been repeated.  Patient is being discharged today in improved condition. Dictated by:   Samul Dada, M.D. Attending Physician:  Carmin Muskrat DD:  02/03/01 TD:  02/04/01 Job: [id number]

## 2010-07-15 ENCOUNTER — Telehealth: Payer: Self-pay | Admitting: Family Medicine

## 2010-07-15 DIAGNOSIS — C8587 Other specified types of non-Hodgkin lymphoma, spleen: Secondary | ICD-10-CM

## 2010-07-15 DIAGNOSIS — E039 Hypothyroidism, unspecified: Secondary | ICD-10-CM

## 2010-07-15 DIAGNOSIS — R609 Edema, unspecified: Secondary | ICD-10-CM

## 2010-07-15 DIAGNOSIS — E7800 Pure hypercholesterolemia, unspecified: Secondary | ICD-10-CM

## 2010-07-15 DIAGNOSIS — K219 Gastro-esophageal reflux disease without esophagitis: Secondary | ICD-10-CM

## 2010-07-15 DIAGNOSIS — D649 Anemia, unspecified: Secondary | ICD-10-CM

## 2010-07-15 DIAGNOSIS — M899 Disorder of bone, unspecified: Secondary | ICD-10-CM

## 2010-07-15 NOTE — Telephone Encounter (Signed)
Message copied by Judy Pimple on Mon Jul 15, 2010  9:40 PM ------      Message from: Alvina Chou      Created: Mon Jul 15, 2010  2:34 PM       Patient is scheduled [facility] CPX labs, please order future labs, Thanks , Camelia Eng

## 2010-07-17 ENCOUNTER — Other Ambulatory Visit (INDEPENDENT_AMBULATORY_CARE_PROVIDER_SITE_OTHER): Payer: Medicare Other | Admitting: Family Medicine

## 2010-07-17 DIAGNOSIS — C8587 Other specified types of non-Hodgkin lymphoma, spleen: Secondary | ICD-10-CM

## 2010-07-17 DIAGNOSIS — R609 Edema, unspecified: Secondary | ICD-10-CM

## 2010-07-17 DIAGNOSIS — E039 Hypothyroidism, unspecified: Secondary | ICD-10-CM

## 2010-07-17 DIAGNOSIS — K219 Gastro-esophageal reflux disease without esophagitis: Secondary | ICD-10-CM

## 2010-07-17 DIAGNOSIS — D649 Anemia, unspecified: Secondary | ICD-10-CM

## 2010-07-17 DIAGNOSIS — M899 Disorder of bone, unspecified: Secondary | ICD-10-CM

## 2010-07-17 DIAGNOSIS — E7800 Pure hypercholesterolemia, unspecified: Secondary | ICD-10-CM

## 2010-07-17 LAB — CBC WITH DIFFERENTIAL/PLATELET
Basophils Relative: 0.5 % (ref 0.0–3.0)
Eosinophils Absolute: 0.2 10*3/uL (ref 0.0–0.7)
HCT: 39.8 % (ref 36.0–46.0)
Hemoglobin: 13.4 g/dL (ref 12.0–15.0)
Lymphocytes Relative: 32.2 % (ref 12.0–46.0)
Lymphs Abs: 2.4 10*3/uL (ref 0.7–4.0)
MCHC: 33.6 g/dL (ref 30.0–36.0)
MCV: 98.4 fl (ref 78.0–100.0)
Neutro Abs: 3.8 10*3/uL (ref 1.4–7.7)
RBC: 4.05 Mil/uL (ref 3.87–5.11)

## 2010-07-17 LAB — LDL CHOLESTEROL, DIRECT: Direct LDL: 172.3 mg/dL

## 2010-07-17 LAB — LIPID PANEL
Cholesterol: 251 mg/dL — ABNORMAL HIGH (ref 0–200)
VLDL: 35.6 mg/dL (ref 0.0–40.0)

## 2010-07-17 LAB — COMPREHENSIVE METABOLIC PANEL
AST: 26 U/L (ref 0–37)
BUN: 13 mg/dL (ref 6–23)
Calcium: 9.3 mg/dL (ref 8.4–10.5)
Chloride: 105 mEq/L (ref 96–112)
Creatinine, Ser: 0.7 mg/dL (ref 0.4–1.2)
GFR: 86.29 mL/min (ref >60.00)

## 2010-07-18 LAB — VITAMIN D 25 HYDROXY (VIT D DEFICIENCY, FRACTURES): Vit D, 25-Hydroxy: 44 ng/mL (ref 30–89)

## 2010-07-23 ENCOUNTER — Encounter: Payer: Self-pay | Admitting: Family Medicine

## 2010-07-23 ENCOUNTER — Ambulatory Visit (INDEPENDENT_AMBULATORY_CARE_PROVIDER_SITE_OTHER): Payer: Medicare Other | Admitting: Family Medicine

## 2010-07-23 DIAGNOSIS — C184 Malignant neoplasm of transverse colon: Secondary | ICD-10-CM

## 2010-07-23 DIAGNOSIS — F329 Major depressive disorder, single episode, unspecified: Secondary | ICD-10-CM

## 2010-07-23 DIAGNOSIS — C8587 Other specified types of non-Hodgkin lymphoma, spleen: Secondary | ICD-10-CM

## 2010-07-23 DIAGNOSIS — E7800 Pure hypercholesterolemia, unspecified: Secondary | ICD-10-CM

## 2010-07-23 DIAGNOSIS — J309 Allergic rhinitis, unspecified: Secondary | ICD-10-CM

## 2010-07-23 DIAGNOSIS — N951 Menopausal and female climacteric states: Secondary | ICD-10-CM

## 2010-07-23 DIAGNOSIS — K219 Gastro-esophageal reflux disease without esophagitis: Secondary | ICD-10-CM

## 2010-07-23 DIAGNOSIS — J328 Other chronic sinusitis: Secondary | ICD-10-CM

## 2010-07-23 DIAGNOSIS — E039 Hypothyroidism, unspecified: Secondary | ICD-10-CM

## 2010-07-23 MED ORDER — FLUTICASONE PROPIONATE 50 MCG/ACT NA SUSP: 2.0000 | Freq: Every day | NASAL | Status: DC

## 2010-07-23 MED ORDER — METH-HYO-M BL-BENZ ACD-PH SAL 81.6 MG PO TABS: 1.0000 | ORAL_TABLET | Freq: Four times a day (QID) | ORAL | Status: DC

## 2010-07-23 MED ORDER — SIMVASTATIN 40 MG PO TABS: 40.0000 mg | ORAL_TABLET | Freq: Every day | ORAL | Status: DC

## 2010-07-23 MED ORDER — AZITHROMYCIN 250 MG PO TABS: ORAL_TABLET | ORAL | Status: AC

## 2010-07-23 MED ORDER — ESTRADIOL 2 MG PO TABS: 2.0000 mg | ORAL_TABLET | Freq: Two times a day (BID) | ORAL | Status: DC

## 2010-07-23 MED ORDER — FLUOXETINE HCL 20 MG PO CAPS: 20.0000 mg | ORAL_CAPSULE | Freq: Two times a day (BID) | ORAL | Status: DC

## 2010-07-23 MED ORDER — OMEPRAZOLE 20 MG PO CPDR: 20.0000 mg | DELAYED_RELEASE_CAPSULE | Freq: Two times a day (BID) | ORAL | Status: DC

## 2010-07-23 MED ORDER — FEXOFENADINE HCL 180 MG PO TABS: 180.0000 mg | ORAL_TABLET | Freq: Every day | ORAL | Status: DC

## 2010-07-23 MED ORDER — LEVOTHYROXINE SODIUM 112 MCG PO TABS: 112.0000 ug | ORAL_TABLET | Freq: Every day | ORAL | Status: DC

## 2010-07-23 MED ORDER — CELECOXIB 200 MG PO CAPS: 200.0000 mg | ORAL_CAPSULE | Freq: Every day | ORAL | Status: DC | PRN

## 2010-07-23 MED ORDER — ZOSTER VACCINE LIVE 19400 UNT/0.65ML ~~LOC~~ SOLR: 0.6500 mL | Freq: Once | SUBCUTANEOUS | Status: DC

## 2010-07-23 NOTE — Assessment & Plan Note (Signed)
Needs bid ppi for adequate control Wt loss would also help  Px written

## 2010-07-23 NOTE — Assessment & Plan Note (Signed)
Cholesterol is way up (on zocor) also with recent wt gain and bad diet (sweets and ice cream) Rev lab in detail with pt Also rev low satfat diet with pt in detail  Will work hard on this Re check lab in 2 mo and f/u If not imp - will have to change statin  Also inst to stop the glucosamine in light of inc chol

## 2010-07-23 NOTE — Assessment & Plan Note (Signed)
Watching this Nl today Nl lytes Rev with pt

## 2010-07-23 NOTE — Assessment & Plan Note (Signed)
Nl cbc today  Regular oncol f/u- stable I do think that a zostavax is a good idea and gave her px for one

## 2010-07-23 NOTE — Progress Notes (Signed)
Subjective:    Patient ID: Diamond Boyd, female    DOB: September 29, 1940, 70 y.o.   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 (no spleen) Td 07  colonosc 7/11- had cancer and surgery GI doctors want her to have colonosc - but medicare won't pay   Lipids are high with LDL of 172 but good HDL in 60s Lab Results  Component Value Date   CHOL 251* 07/17/2010   CHOL 196 07/18/2009   CHOL 189 07/05/2008   Lab Results  Component Value Date   HDL 68.90 07/17/2010   HDL 60.10 07/18/2009   HDL 55.60 07/05/2008   Lab Results  Component Value Date   LDLCALC 105* 07/18/2009   LDLCALC 100* 07/05/2008   LDLCALC 106* 06/10/2007   Lab Results  Component Value Date   TRIG 178.0* 07/17/2010   TRIG 155.0* 07/18/2009   TRIG 168.0* 07/05/2008   Lab Results  Component Value Date   CHOLHDL 4 07/17/2010   CHOLHDL 3 07/18/2009   CHOLHDL 3 07/05/2008   Lab Results  Component Value Date   LDLDIRECT 172.3 07/17/2010   diet  Is worse - eating too many sweets  Is on zocor  Eating ice cream   Phos and lytes nl   No anemia today  Hx of osteopenia but nl bmd on dexa 11/10 Is taking her calcium and vitamin D    Patient Active Problem List  Diagnoses  . MALIGNANT NEOPLASM OF TRANSVERSE COLON  . LYMPHOMA NEC, MLIG, SPLEEN  . HYPERCHOLESTEROLEMIA, PURE  . DISORDERS OF PHOSPHORUS METABOLISM  . Anxiety state, unspecified  . DEPRESSION    . PERIPHERAL VASCULAR DISEASE  . Other chronic sinusitis  . ALLERGIC  RHINITIS  . GERD  . OVERACTIVE BLADDER  . MENOPAUSAL SYNDROME  . OSTEOARTHRITIS  . LEG EDEMA  . THROAT PAIN, CHRONIC  . SKIN CANCER, HX OF  . COLONIC POLYPS, HX OF   Past Medical History  Diagnosis Date  . Colon polyps   . Depression   . Hypothyroidism   . Osteoarthritis     hands  . Peripheral vascular disease   . Degenerative disk disease     spine in center in past   . Cough   . Cancer     skin cancer- basal cell on arm / colon   . Lymphoma    Past Surgical History  Procedure Date  . Cholecystectomy   . Splenectomy     lymphoma  . Breast biopsy 1996  . Plantar fascia release   . Ventral hernia repair 1998  . Tendon release     Right thumb   . Colectomy 8/11   History  Substance Use Topics  . Smoking status: Never Smoker   . Smokeless tobacco: Not  on file  . Alcohol Use: No   Family History  Problem Relation Age of Onset  . Coronary artery disease Mother   . Alcohol abuse Mother   . Cancer Father     jaw  . Diabetes Brother   . Fibromyalgia Daughter     chronic pain   . COPD Daughter    Allergies  Allergen Reactions  . Allopurinol     REACTION: Unsure of reaction happene years ago  . Cephalexin     REACTION: unsure of reaction happened yrs ago.  . Ciprofloxacin     REACTION: rash  . Codeine     REACTION: abd. pain  . Meloxicam     REACTION: GI symptoms  . Minocycline   . Nabumetone     REACTION: reaction not known  . Penicillins     REACTION: reaction not known  . Sulfonamide Derivatives     REACTION: reaction not known  . Zolpidem Tartrate     REACTION: feels too drugged  . Buspar (Buspirone Hcl)     Dizziness, and not as effective for anxiety   Current Outpatient Prescriptions on File Prior to Visit  Medication Sig Dispense Refill  . Calcium Carbonate-Vitamin D (CALCIUM 600+D) 600-400 MG-UNIT per tablet Take 1 tablet by mouth daily.        . cholecalciferol  (VITAMIN D) 1000 UNITS tablet Take 1,000 Units by mouth daily.        . Omega-3 350 MG CAPS Take by mouth. Take once capsule by mouth once a day       . vitamin E (VITAMIN E) 400 UNIT capsule Take 400 Units by mouth daily.        Marland Kitchen DISCONTD: celecoxib (CELEBREX) 200 MG capsule Take 200 mg by mouth 2 (two) times daily.       Marland Kitchen DISCONTD: estradiol (ESTRACE) 2 MG tablet Take one half by mouth twice a day      . DISCONTD: fexofenadine (ALLEGRA) 180 MG tablet Take 180 mg by mouth daily.        Marland Kitchen DISCONTD: FLUoxetine (PROZAC) 20 MG capsule Take 1 capsule (20 mg total) by mouth 2 (two) times daily.  180 capsule  3  . DISCONTD: fluticasone (FLONASE) 50 MCG/ACT nasal spray 1 spray in each nostril twice daily      . DISCONTD: Glucosamine-Chondroit-Vit C-Mn (GLUCOSAMINE-CHONDROITIN) CAPS Take by mouth. Take 1 capsule by mouth once a day       . DISCONTD: levothyroxine (SYNTHROID, LEVOTHROID) 112 MCG tablet Take 112 mcg by mouth daily.        Marland Kitchen DISCONTD: Meth-Hyo-M Bl-Benz Acd-Ph Sal (PROSED/DS PO) Take by mouth. 1 by mouth up to 4 times daily as needed       . DISCONTD: omeprazole (PRILOSEC) 20 MG capsule Take 1 capsule (20 mg total) by mouth 2 (two) times daily. Take one 30-60 min before first and last meals of the day as long as coughing at all, then once daily before breakfast   180 capsule  3  . DISCONTD: simvastatin (ZOCOR) 40 MG tablet Take 40 mg by mouth daily.        Marland Kitchen LORazepam (ATIVAN) 1 MG tablet 1/2 to 1 by mouth once daily as needed severe anxiety watch out of sedation      . DISCONTD: iron polysaccharides (NU-IRON) 150 MG capsule Take 1 capsule (150 mg total) by mouth 2 (two) times daily. One capsule by mouth two times a day  60 capsule  5  Review of Systems Review of Systems  Constitutional: Negative for fever, appetite change, and unexpected weight change.pos for mild chronic fatigue  Eyes: Negative for pain and visual disturbance.  Respiratory: Negative for cough and shortness  of breath.   Cardiovascular: Negative. For cp or palpitations, occ edema  Gastrointestinal: Negative for nausea, diarrhea and constipation. no blood in stool Genitourinary: chronic frequency- takes prosed DS qid for years/ no dysuria or hematuria Skin: Negative for pallor. or rash or new lesions Neurological: Negative for weakness, light-headedness, numbness and headaches.  Hematological: Negative for adenopathy. Does not bruise/bleed easily.  Psychiatric/Behavioral: pos for dep and anx that are fairly controlled with prozac MSK pos for leg pain and OA in more than one joint         Objective:   Physical Exam  Constitutional: She appears well-developed and well-nourished. No distress.       Obese and well appearing   HENT:  Head: Normocephalic and atraumatic.  Right Ear: External ear normal.  Left Ear: External ear normal.  Mouth/Throat: Oropharynx is clear and moist.       Nares are injected and congested  Maxillary sinus tenderness bilat   Eyes: Conjunctivae and EOM are normal. Pupils are equal, round, and reactive to light. Right eye exhibits no discharge. Left eye exhibits no discharge.  Neck: Normal range of motion. Neck supple. No JVD present. Carotid bruit is not present. No thyromegaly present.  Cardiovascular: Normal rate, regular rhythm, normal heart sounds and intact distal pulses.   Pulmonary/Chest: Effort normal and breath sounds normal. No respiratory distress. She has no wheezes. She exhibits no tenderness.  Abdominal: Soft. Bowel sounds are normal. She exhibits no distension, no abdominal bruit and no mass. There is no tenderness.       Baseline scars No tenderness  Genitourinary: No breast swelling, tenderness, discharge or bleeding.  Musculoskeletal: Normal range of motion. She exhibits no edema and no tenderness.  Lymphadenopathy:    She has no cervical adenopathy.  Neurological: She is alert. She has normal reflexes. No cranial nerve deficit. Coordination  normal.  Skin: Skin is warm and dry. No rash noted. No erythema. No pallor.  Psychiatric: She has a normal mood and affect.       Mildly anxious which is baseline for her           Assessment & Plan:

## 2010-07-23 NOTE — Assessment & Plan Note (Signed)
Rev pros and cons and RISKS of hrt today Pt refuses to stop them due to severe vasomotor symptoms She is well aware of risks of breast cancer as well as blood clots and poss vascular risks and voiced understanding Pt thinks the benefits outweigh the risks at this time

## 2010-07-23 NOTE — Patient Instructions (Addendum)
Take zithromax for sinus infection and update if not improving  Stop sweets Stop ice cream Avoid red meat/ fried foods/ egg yolks/ fatty breakfast meats/ butter, cheese and high fat dairy/ and shellfish   Work on weight loss- aim for 30 minutes of exercise 5 days per week- work up to it  Re check fasting labs for cholesterol in 2 months and then follow up with me to discuss results  Here is px for shingles vaccine to get here or at pharmacy Stop your glucosamine/ chondrioton supplement since it can increase cholesterol

## 2010-07-23 NOTE — Assessment & Plan Note (Signed)
refil allegra which she can get by px  Is tied into chronic sinusitis

## 2010-07-23 NOTE — Assessment & Plan Note (Signed)
Pt doing very well with frequent follow ups  Some concern about frequency of colonoscopies at this point and insurance coverage She will address this with her GI doctor

## 2010-07-23 NOTE — Assessment & Plan Note (Signed)
Exacerbation today with facial pain and congestion  Continue allergy f/u tx with zithromax and update if not imp

## 2010-07-23 NOTE — Assessment & Plan Note (Signed)
Refilled prozac Overall depression is stable

## 2010-08-05 ENCOUNTER — Ambulatory Visit
Admission: RE | Admit: 2010-08-05 | Discharge: 2010-08-05 | Disposition: A | Discharge status: Home / Self Care | Payer: Medicare Other | Source: Ambulatory Visit | Attending: Family Medicine | Admitting: Family Medicine

## 2010-08-05 DIAGNOSIS — Z1231 Encounter for screening mammogram for malignant neoplasm of breast: Secondary | ICD-10-CM

## 2010-08-09 ENCOUNTER — Encounter: Payer: Self-pay | Admitting: *Deleted

## 2010-08-26 ENCOUNTER — Encounter (HOSPITAL_BASED_OUTPATIENT_CLINIC_OR_DEPARTMENT_OTHER): Payer: Medicare Other | Admitting: Oncology

## 2010-08-26 ENCOUNTER — Encounter: Payer: Self-pay | Admitting: Gastroenterology

## 2010-08-26 ENCOUNTER — Other Ambulatory Visit (HOSPITAL_COMMUNITY): Payer: Self-pay | Admitting: Oncology

## 2010-08-26 DIAGNOSIS — D649 Anemia, unspecified: Secondary | ICD-10-CM

## 2010-08-26 DIAGNOSIS — D509 Iron deficiency anemia, unspecified: Secondary | ICD-10-CM

## 2010-08-26 DIAGNOSIS — C187 Malignant neoplasm of sigmoid colon: Secondary | ICD-10-CM

## 2010-08-26 DIAGNOSIS — C184 Malignant neoplasm of transverse colon: Secondary | ICD-10-CM

## 2010-08-26 DIAGNOSIS — C189 Malignant neoplasm of colon, unspecified: Secondary | ICD-10-CM

## 2010-08-26 DIAGNOSIS — C8589 Other specified types of non-Hodgkin lymphoma, extranodal and solid organ sites: Secondary | ICD-10-CM

## 2010-08-26 LAB — CBC WITH DIFFERENTIAL/PLATELET
BASO%: 0.7 % (ref 0.0–2.0)
Basophils Absolute: 0 10*3/uL (ref 0.0–0.1)
EOS%: 3.8 % (ref 0.0–7.0)
Eosinophils Absolute: 0.2 10*3/uL (ref 0.0–0.5)
HCT: 39.6 % (ref 34.8–46.6)
HGB: 13.4 g/dL (ref 11.6–15.9)
LYMPH%: 41 % (ref 14.0–49.7)
MCH: 32.4 pg (ref 25.1–34.0)
MCHC: 33.9 g/dL (ref 31.5–36.0)
MCV: 95.7 fL (ref 79.5–101.0)
MONO#: 0.9 10*3/uL (ref 0.1–0.9)
MONO%: 16.9 % — ABNORMAL HIGH (ref 0.0–14.0)
NEUT#: 2 10*3/uL (ref 1.5–6.5)
NEUT%: 37.6 % — ABNORMAL LOW (ref 38.4–76.8)
Platelets: 218 10*3/uL (ref 145–400)
RBC: 4.14 10*6/uL (ref 3.70–5.45)
RDW: 13.4 % (ref 11.2–14.5)
WBC: 5.4 10*3/uL (ref 3.9–10.3)
lymph#: 2.2 10*3/uL (ref 0.9–3.3)

## 2010-08-26 LAB — IRON AND TIBC
%SAT: 40 % (ref 20–55)
Iron: 119 ug/dL (ref 42–145)

## 2010-08-26 LAB — COMPREHENSIVE METABOLIC PANEL
Alkaline Phosphatase: 73 U/L (ref 39–117)
BUN: 13 mg/dL (ref 6–23)
Creatinine, Ser: 0.73 mg/dL (ref 0.50–1.10)
Glucose, Bld: 96 mg/dL (ref 70–99)
Sodium: 141 mEq/L (ref 135–145)
Total Bilirubin: 0.5 mg/dL (ref 0.3–1.2)

## 2010-09-17 ENCOUNTER — Other Ambulatory Visit: Payer: Medicare Other

## 2010-09-23 ENCOUNTER — Ambulatory Visit: Payer: Medicare Other | Admitting: Family Medicine

## 2010-09-27 ENCOUNTER — Ambulatory Visit (AMBULATORY_SURGERY_CENTER): Payer: Medicare Other | Admitting: *Deleted

## 2010-09-27 VITALS — Ht 66.0 in | Wt 237.4 lb

## 2010-09-27 DIAGNOSIS — Z1211 Encounter for screening for malignant neoplasm of colon: Secondary | ICD-10-CM

## 2010-09-27 DIAGNOSIS — Z85038 Personal history of other malignant neoplasm of large intestine: Secondary | ICD-10-CM

## 2010-09-27 MED ORDER — PEG-KCL-NACL-NASULF-NA ASC-C 100 G PO SOLR: ORAL | Status: DC

## 2010-10-08 ENCOUNTER — Telehealth: Payer: Self-pay | Admitting: *Deleted

## 2010-10-08 NOTE — Telephone Encounter (Signed)
Patient notified as instructed by telephone. Pt said it is OK to try the generic Levoxyl. If pt has problem or concern she will call back. Pt is taking Estradiol 2mg  tab taking 1 tablet twice a day.

## 2010-10-08 NOTE — Telephone Encounter (Signed)
Fax from the pharmacy, they are changing their brand of generic levoxyl, form is on your shelf.

## 2010-10-08 NOTE — Telephone Encounter (Signed)
Form completed as directed and faxed to [phone]. Med list has been updated.Form sent to be scanned.

## 2010-10-08 NOTE — Telephone Encounter (Signed)
Form from pharmacy asking for clarification on directions for estradiol.  Form is on your shelf.

## 2010-10-08 NOTE — Telephone Encounter (Signed)
Thanks - please indicate on form that she is taking the estrogen 1 pill bid on the form (I already signed it )  And also make sure it is clear on her med list  Thanks!

## 2010-10-08 NOTE — Telephone Encounter (Signed)
Please confirm with pt that 1) she is ok with change in thyroid med, and 2) how she is dosing her estradiol Thanks  I put forms in the IN box

## 2010-10-08 NOTE — Telephone Encounter (Signed)
Patient's husband notified as instructed by telephone. Mr. Schmiesing will have pt call back with info.

## 2010-10-10 ENCOUNTER — Ambulatory Visit (AMBULATORY_SURGERY_CENTER): Payer: Medicare Other | Admitting: Gastroenterology

## 2010-10-10 ENCOUNTER — Encounter: Payer: Self-pay | Admitting: Gastroenterology

## 2010-10-10 DIAGNOSIS — Z1211 Encounter for screening for malignant neoplasm of colon: Secondary | ICD-10-CM

## 2010-10-10 DIAGNOSIS — Z85038 Personal history of other malignant neoplasm of large intestine: Secondary | ICD-10-CM

## 2010-10-10 DIAGNOSIS — D126 Benign neoplasm of colon, unspecified: Secondary | ICD-10-CM

## 2010-10-10 MED ORDER — SODIUM CHLORIDE 0.9 % IV SOLN: 500.0000 mL | INTRAVENOUS | Status: DC

## 2010-10-10 NOTE — Patient Instructions (Addendum)
Polyps, Colon  A polyp is extra tissue that grows inside your body. Colon polyps grow in the large intestine. The large intestine, also called the colon, is part of your digestive system. It is a long, hollow tube at the end of your digestive tract where your body makes and stores stool. Most polyps are not dangerous. They are benign. This means they are not cancerous. But over time, some types of polyps can turn into cancer. Polyps that are smaller than a pea are usually not harmful. But larger polyps could someday become or may already be cancerous. To be safe, doctors remove all polyps and test them.  WHO GETS POLYPS? Anyone can get polyps, but certain people are more likely than others. You may have a greater chance of getting polyps if:  You are over 50.   You have had polyps before.   Someone in your family has had polyps.   Someone in your family has had cancer of the large intestine.   Find out if someone in your family has had polyps. You may also be more likely to get polyps if you:   Eat a lot of fatty foods   Smoke   Drink alcohol   Do not exercise  Eat too much  SYMPTOMS Most small polyps do not cause symptoms. People often do not know they have one until their caregiver finds it during a regular checkup or while testing them for something else. Some people do have symptoms like these:  Bleeding from the anus. You might notice blood on your underwear or on toilet paper after you have had a bowel movement.   Constipation or diarrhea that lasts more than a week.   Blood in the stool. Blood can make stool look black or it can show up as red streaks in the stool.  If you have any of these symptoms, see your caregiver. HOW DOES THE DOCTOR TEST FOR POLYPS? The doctor can use four tests to check for polyps:  Digital rectal exam. The caregiver wears gloves and checks your rectum (the last part of the large intestine) to see if it feels normal. This test would find polyps only  in the rectum. Your caregiver may need to do one of the other tests listed below to find polyps higher up in the intestine.   Barium enema. The caregiver puts a liquid called barium into your rectum before taking x-rays of your large intestine. Barium makes your intestine look white in the pictures. Polyps are dark, so they are easy to see.   Sigmoidoscopy. With this test, the caregiver can see inside your large intestine. A thin flexible tube is placed into your rectum. The device is called a sigmoidoscope, which has a light and a tiny video camera in it. The caregiver uses the sigmoidoscope to look at the last third of your large intestine.   Colonoscopy. This test is like sigmoidoscopy, but the caregiver looks at all of the large intestine. It usually requires sedation. This is the most common method for finding and removing polyps.  TREATMENT  The caregiver will remove the polyp during sigmoidoscopy or colonoscopy. The polyp is then tested for cancer.   If you have had polyps, your caregiver may want you to get tested regularly in the future.  PREVENTION There is not one sure way to prevent polyps. You might be able to lower your risk of getting them if you:  Eat more fruits and vegetables and less fatty food.     Do not smoke.   Avoid alcohol.   Exercise every day.   Lose weight if you are overweight.   Eating more calcium and folate can also lower your risk of getting polyps. Some foods that are rich in calcium are milk, cheese, and broccoli. Some foods that are rich in folate are chickpeas, kidney beans, and spinach.   Aspirin might help prevent polyps. Studies are under way.  Document Released: 10/24/2003 Document Re-Released: 07/17/2009 [facility] Patient Information 2011 [place], [place].  Please follow all discharge instructions given to you by the recovery room nurse. If you have any questions or problems after discharge please call [phone]. You will receive a phone call in the  am to see how you are doing and answer any questions you may have. Thank you for choosing [facility] for your health care needs.

## 2010-10-11 ENCOUNTER — Telehealth: Payer: Self-pay

## 2010-10-11 ENCOUNTER — Other Ambulatory Visit: Payer: Medicare Other | Admitting: Gastroenterology

## 2010-10-11 NOTE — Telephone Encounter (Signed)
Left message with husband to have her to call with any questions/concerns

## 2010-10-19 DIAGNOSIS — J018 Other acute sinusitis: Secondary | ICD-10-CM

## 2010-10-29 ENCOUNTER — Ambulatory Visit (INDEPENDENT_AMBULATORY_CARE_PROVIDER_SITE_OTHER): Payer: Medicare Other | Admitting: Family Medicine

## 2010-10-29 DIAGNOSIS — J069 Acute upper respiratory infection, unspecified: Secondary | ICD-10-CM

## 2010-10-29 NOTE — Progress Notes (Signed)
  Subjective:    Patient ID: Diamond Boyd, female    DOB: 09/13/1940, 70 y.o.   MRN: [id number]  HPI  Epic system down. Please see scanned written note, orders and charges.    Review of Systems     Objective:   Physical Exam        Assessment & Plan:

## 2010-11-12 ENCOUNTER — Other Ambulatory Visit (INDEPENDENT_AMBULATORY_CARE_PROVIDER_SITE_OTHER): Payer: Medicare Other

## 2010-11-12 DIAGNOSIS — E7800 Pure hypercholesterolemia, unspecified: Secondary | ICD-10-CM

## 2010-11-12 LAB — LIPID PANEL
Cholesterol: 216 mg/dL — ABNORMAL HIGH (ref 0–200)
Total CHOL/HDL Ratio: 4
VLDL: 41.6 mg/dL — ABNORMAL HIGH (ref 0.0–40.0)

## 2010-11-12 LAB — LDL CHOLESTEROL, DIRECT: Direct LDL: 133.8 mg/dL

## 2010-11-20 ENCOUNTER — Ambulatory Visit (INDEPENDENT_AMBULATORY_CARE_PROVIDER_SITE_OTHER): Payer: Medicare Other | Admitting: Family Medicine

## 2010-11-20 ENCOUNTER — Encounter: Payer: Self-pay | Admitting: Family Medicine

## 2010-11-20 DIAGNOSIS — R030 Elevated blood-pressure reading, without diagnosis of hypertension: Secondary | ICD-10-CM

## 2010-11-20 DIAGNOSIS — Z23 Encounter for immunization: Secondary | ICD-10-CM

## 2010-11-20 DIAGNOSIS — E7800 Pure hypercholesterolemia, unspecified: Secondary | ICD-10-CM

## 2010-11-20 DIAGNOSIS — E669 Obesity, unspecified: Secondary | ICD-10-CM

## 2010-11-20 DIAGNOSIS — R070 Pain in throat: Secondary | ICD-10-CM

## 2010-11-20 MED ORDER — FEXOFENADINE HCL 180 MG PO TABS: 180.0000 mg | ORAL_TABLET | Freq: Every day | ORAL | Status: DC

## 2010-11-20 MED ORDER — METH-HYO-M BL-BENZ ACD-PH SAL 81.6 MG PO TABS: 1.0000 | ORAL_TABLET | Freq: Four times a day (QID) | ORAL | Status: DC

## 2010-11-20 NOTE — Patient Instructions (Addendum)
I want you to work on weight loss - with healthy diet and exercise We need to make sure your blood pressure comes down more Good progress on the cholesterol so far -- keep working on it Avoid red meat/ fried foods/ egg yolks/ fatty breakfast meats/ butter, cheese and high fat dairy/ and shellfish   Get to the gym also walking  Think about weight watchers program to help with diet  We will refer you to ENT in [place] at check out for throat problem Flu shot today  Schedule fasting lab and follow up in 3 months

## 2010-11-20 NOTE — Progress Notes (Signed)
Subjective:    Patient ID: Diamond Boyd, female    DOB: 1940/11/10, 70 y.o.   MRN: [id number]  HPI Here for f/u of hyperlipidemia an other chronic health problems  Is feeling ok overall - nothing new  Her mammogram was normal   She is still having throat problems - and clears it all the time - ? Does not know what is going on  Thinks she feels something in her throat again - sees mucous? And not pain but uncomfortable and has coughing spells and throat gets dry  Water helps prilosec helps her acid reflux  Wants 2nd op ENT in [place]   Found out she can get her allegra by px - needs 3 mo  Also prosed - needs that printed   Wt is up 3 lb with bmi of 38  140/62- bp is improved today  Lipids are improved with LDL 130s now down from 170s Is on zocor Lab Results  Component Value Date   CHOL 216* 11/12/2010   CHOL 251* 07/17/2010   CHOL 196 07/18/2009   Lab Results  Component Value Date   HDL 59.40 11/12/2010   HDL 78.46 07/17/2010   HDL 60.10 07/18/2009   Lab Results  Component Value Date   LDLCALC 105* 07/18/2009   LDLCALC 100* 07/05/2008   LDLCALC 106* 06/10/2007   Lab Results  Component Value Date   TRIG 208.0* 11/12/2010   TRIG 178.0* 07/17/2010   TRIG 155.0* 07/18/2009   Lab Results  Component Value Date   CHOLHDL 4 11/12/2010   CHOLHDL 4 07/17/2010   CHOLHDL 3 07/18/2009   Lab Results  Component Value Date   LDLDIRECT 133.8 11/12/2010   LDLDIRECT 172.3 07/17/2010   diet- is improved - is avoiding fried foods and more broiled fish and chicken , little to no beef   Exercise - none at all - wants to start walking  Needs to get weight off   Had shingles vaccine- was over a month ago  Wants to get flu shot today  Patient Active Problem List  Diagnoses  . MALIGNANT NEOPLASM OF TRANSVERSE COLON  . LYMPHOMA NEC, MLIG, SPLEEN  . HYPERCHOLESTEROLEMIA, PURE  . DISORDERS OF PHOSPHORUS METABOLISM  . Anxiety state, unspecified  . DEPRESSION  . PERIPHERAL VASCULAR DISEASE  . Other  chronic sinusitis  . ALLERGIC  RHINITIS  . GERD  . OVERACTIVE BLADDER  . MENOPAUSAL SYNDROME  . OSTEOARTHRITIS  . LEG EDEMA  . THROAT PAIN, CHRONIC  . SKIN CANCER, HX OF  . COLONIC POLYPS, HX OF  . Elevated blood pressure  . Obesity   Past Medical History  Diagnosis Date  . Colon polyps   . Depression   . Hypothyroidism   . Osteoarthritis     hands  . Peripheral vascular disease   . Degenerative disk disease     spine in center in past   . Cough   . Cancer     skin cancer- basal cell on arm / colon 2011  . Lymphoma   . Colon cancer    Past Surgical History  Procedure Date  . Cholecystectomy   . Splenectomy     lymphoma  . Breast biopsy 1996  . Plantar fascia release   . Ventral hernia repair 1998  . Tendon release     Right thumb   . Colectomy 8/11  . Vaginal hysterectomy    History  Substance Use Topics  . Smoking status: Never Smoker   . Smokeless  tobacco: Not on file  . Alcohol Use: No   Family History  Problem Relation Age of Onset  . Coronary artery disease Mother   . Alcohol abuse Mother   . Cancer Father     jaw  . Diabetes Brother   . Fibromyalgia Daughter     chronic pain   . COPD Daughter   . Colon cancer Neg Hx   . Colon polyps Neg Hx   . Stomach cancer Neg Hx    Allergies  Allergen Reactions  . Achromycin (Tetracycline Hcl)     Pt does not remember reaction  . Allopurinol     REACTION: Unsure of reaction happene years ago  . Cephalexin     REACTION: unsure of reaction happened yrs ago.  . Ciprofloxacin     REACTION: rash  . Codeine     REACTION: abd. pain  . Keflex     Does not remember  . Meloxicam     REACTION: GI symptoms  . Minocycline     Abdominal pain  . Nabumetone     REACTION: reaction not known  . Penicillins     REACTION: reaction not known  . Sulfonamide Derivatives     Abdominal pain  . Zolpidem Tartrate     REACTION: feels too drugged  . Buspar (Buspirone Hcl)     Dizziness, and not as effective for  anxiety   Current Outpatient Prescriptions on File Prior to Visit  Medication Sig Dispense Refill  . Calcium Carbonate-Vitamin D (CALCIUM 600+D) 600-400 MG-UNIT per tablet Take 1 tablet by mouth daily.        . celecoxib (CELEBREX) 200 MG capsule Take 1 capsule (200 mg total) by mouth daily as needed for pain (with food).  90 capsule  3  . cholecalciferol (VITAMIN D) 1000 UNITS tablet Take 1,000 Units by mouth daily.        Marland Kitchen estradiol (ESTRACE) 2 MG tablet Take 2 mg by mouth 2 (two) times daily.        Marland Kitchen FLUoxetine (PROZAC) 20 MG capsule Take 1 capsule (20 mg total) by mouth 2 (two) times daily.  180 capsule  3  . levothyroxine (SYNTHROID, LEVOTHROID) 112 MCG tablet Take 1 tablet (112 mcg total) by mouth daily.  90 tablet  3  . Omega-3 350 MG CAPS Take by mouth. Take once capsule by mouth once a day       . omeprazole (PRILOSEC) 20 MG capsule Take 1 capsule (20 mg total) by mouth 2 (two) times daily. Take one 30-60 min before first and last meals of the day as long as coughing at all, then once daily before breakfast   180 capsule  3  . simvastatin (ZOCOR) 40 MG tablet Take 1 tablet (40 mg total) by mouth daily.  90 tablet  3  . vitamin E (VITAMIN E) 400 UNIT capsule Take 400 Units by mouth daily.        Marland Kitchen LORazepam (ATIVAN) 1 MG tablet 1/2 to 1 by mouth once daily as needed severe anxiety watch out of sedation      . zoster vaccine live, PF, (ZOSTAVAX) 16109 UNT/0.65ML injection Inject 19,400 Units into the skin once.  1 vial  0   Current Facility-Administered Medications on File Prior to Visit  Medication Dose Route Frequency Provider Last Rate Last Dose  . 0.9 %  sodium chloride infusion  500 mL Intravenous Continuous Louis Meckel, MD  Review of Systems Review of Systems  Constitutional: Negative for fever, appetite change, fatigue and unexpected weight change.  Eyes: Negative for pain and visual disturbance.  ENT pos for throat discomfort-globus sensation/ some post  nasal drip / no ear or sinus pain  Respiratory: Negative for cough and shortness of breath.   Cardiovascular: Negative for cp or palpitations    Gastrointestinal: Negative for nausea, diarrhea and constipation.  Genitourinary: Negative for urgency and frequency.  Skin: Negative for pallor or rash   Neurological: Negative for weakness, light-headedness, numbness and headaches.  Hematological: Negative for adenopathy. Does not bruise/bleed easily.  Psychiatric/Behavioral: Negative for dysphoric mood. The patient is not nervous/anxious.          Objective:   Physical Exam  Constitutional: She appears well-developed and well-nourished. No distress.       overwt and well appearing   HENT:  Head: Normocephalic and atraumatic.  Right Ear: External ear normal.  Left Ear: External ear normal.  Nose: Nose normal.  Mouth/Throat: Oropharynx is clear and moist.       Nares are boggy  Eyes: Conjunctivae and EOM are normal. Pupils are equal, round, and reactive to light. Right eye exhibits no discharge. Left eye exhibits no discharge. No scleral icterus.  Neck: Normal range of motion. Neck supple. No JVD present. Carotid bruit is not present. No thyromegaly present.  Cardiovascular: Normal rate, regular rhythm and intact distal pulses.  Exam reveals no gallop.   Pulmonary/Chest: Effort normal and breath sounds normal. No respiratory distress. She has no wheezes.  Abdominal: Soft. Bowel sounds are normal. She exhibits no distension and no mass. There is no tenderness.       Baseline scars noted   Musculoskeletal: She exhibits no edema and no tenderness.  Lymphadenopathy:    She has no cervical adenopathy.  Neurological: She is alert. She has normal reflexes. No cranial nerve deficit. Coordination normal.  Skin: Skin is warm and dry. No rash noted. No erythema. No pallor.  Psychiatric: She has a normal mood and affect.       Tangential and difficult to communicate with at times Pleasant and  talkative but mildly anxious           Assessment & Plan:

## 2010-11-22 NOTE — Assessment & Plan Note (Signed)
This is improved so far with better diet Rev labs with pt  Rev low sat fat diet  r echeck 3 mo and f/u

## 2010-11-22 NOTE — Assessment & Plan Note (Signed)
Stressed to pt imp of wt loss for all her health issues She is not very motivatied  Outlined a plan for healthy diet - also to check out wt watchers program , and for daily exercise - as tolerated  F/u 3 mo

## 2010-11-22 NOTE — Assessment & Plan Note (Signed)
This is improved  from last visit but overall better than it was  Disc need for better diet and exercise and wt loss  Will re check 3 mo- if not further imp need to disc dx of HTN and start tx

## 2010-11-23 NOTE — Assessment & Plan Note (Signed)
This is ongoing with discomfort and global sensation -- affecting swallowing  Several issues in the past - and wants to see new ENT in [place] Will ref to ENT in [place]

## 2010-12-09 ENCOUNTER — Ambulatory Visit (HOSPITAL_COMMUNITY)
Admission: RE | Admit: 2010-12-09 | Discharge: 2010-12-09 | Disposition: A | Discharge status: Home / Self Care | Payer: Medicare Other | Source: Ambulatory Visit | Attending: Oncology | Admitting: Oncology

## 2010-12-09 ENCOUNTER — Other Ambulatory Visit (HOSPITAL_COMMUNITY): Payer: Self-pay | Admitting: Oncology

## 2010-12-09 ENCOUNTER — Encounter (HOSPITAL_BASED_OUTPATIENT_CLINIC_OR_DEPARTMENT_OTHER): Payer: Medicare Other | Admitting: Oncology

## 2010-12-09 DIAGNOSIS — C189 Malignant neoplasm of colon, unspecified: Secondary | ICD-10-CM

## 2010-12-09 DIAGNOSIS — C801 Malignant (primary) neoplasm, unspecified: Secondary | ICD-10-CM

## 2010-12-09 DIAGNOSIS — D649 Anemia, unspecified: Secondary | ICD-10-CM

## 2010-12-09 DIAGNOSIS — C8589 Other specified types of non-Hodgkin lymphoma, extranodal and solid organ sites: Secondary | ICD-10-CM

## 2010-12-09 LAB — CBC WITH DIFFERENTIAL/PLATELET
Basophils Absolute: 0.1 10*3/uL (ref 0.0–0.1)
Eosinophils Absolute: 0.2 10*3/uL (ref 0.0–0.5)
HCT: 39.4 % (ref 34.8–46.6)
HGB: 13.2 g/dL (ref 11.6–15.9)
MCH: 30.9 pg (ref 25.1–34.0)
MCV: 92.3 fL (ref 79.5–101.0)
MONO%: 14.4 % — ABNORMAL HIGH (ref 0.0–14.0)
NEUT#: 2.3 10*3/uL (ref 1.5–6.5)
NEUT%: 37.2 % — ABNORMAL LOW (ref 38.4–76.8)
RDW: 14.1 % (ref 11.2–14.5)
lymph#: 2.7 10*3/uL (ref 0.9–3.3)

## 2010-12-09 LAB — COMPREHENSIVE METABOLIC PANEL
ALT: 29 U/L (ref 0–35)
Albumin: 4.4 g/dL (ref 3.5–5.2)
Alkaline Phosphatase: 79 U/L (ref 39–117)
CO2: 29 mEq/L (ref 19–32)
Potassium: 4.4 mEq/L (ref 3.5–5.3)
Sodium: 143 mEq/L (ref 135–145)
Total Bilirubin: 0.5 mg/dL (ref 0.3–1.2)
Total Protein: 6.1 g/dL (ref 6.0–8.3)

## 2010-12-09 LAB — IRON AND TIBC
%SAT: 36 % (ref 20–55)
TIBC: 305 ug/dL (ref 250–470)

## 2011-02-13 DIAGNOSIS — J309 Allergic rhinitis, unspecified: Secondary | ICD-10-CM | POA: Diagnosis not present

## 2011-02-14 ENCOUNTER — Telehealth: Payer: Self-pay | Admitting: Oncology

## 2011-02-14 NOTE — Telephone Encounter (Signed)
Pt moved to [facility] per Dr Arline Asp on 04/10/11

## 2011-02-18 ENCOUNTER — Other Ambulatory Visit: Payer: Medicare Other

## 2011-02-18 DIAGNOSIS — J309 Allergic rhinitis, unspecified: Secondary | ICD-10-CM | POA: Diagnosis not present

## 2011-02-19 ENCOUNTER — Other Ambulatory Visit (INDEPENDENT_AMBULATORY_CARE_PROVIDER_SITE_OTHER): Payer: Medicare Other

## 2011-02-19 DIAGNOSIS — E7800 Pure hypercholesterolemia, unspecified: Secondary | ICD-10-CM

## 2011-02-19 DIAGNOSIS — R030 Elevated blood-pressure reading, without diagnosis of hypertension: Secondary | ICD-10-CM | POA: Diagnosis not present

## 2011-02-19 LAB — COMPREHENSIVE METABOLIC PANEL
Albumin: 4.2 g/dL (ref 3.5–5.2)
Alkaline Phosphatase: 83 U/L (ref 39–117)
BUN: 11 mg/dL (ref 6–23)
Calcium: 9 mg/dL (ref 8.4–10.5)
Creatinine, Ser: 0.7 mg/dL (ref 0.4–1.2)
Glucose, Bld: 102 mg/dL — ABNORMAL HIGH (ref 70–99)
Potassium: 4.2 mEq/L (ref 3.5–5.1)

## 2011-02-19 LAB — LIPID PANEL
Cholesterol: 214 mg/dL — ABNORMAL HIGH (ref 0–200)
HDL: 56.6 mg/dL (ref >39.00)
Total CHOL/HDL Ratio: 4
Triglycerides: 185 mg/dL — ABNORMAL HIGH (ref 0.0–149.0)

## 2011-02-24 ENCOUNTER — Encounter: Payer: Self-pay | Admitting: Family Medicine

## 2011-02-24 ENCOUNTER — Ambulatory Visit (INDEPENDENT_AMBULATORY_CARE_PROVIDER_SITE_OTHER): Payer: Medicare Other | Admitting: Family Medicine

## 2011-02-24 VITALS — BP 132/74 | HR 72 | Temp 98.0°F | Ht 66.0 in | Wt 239.0 lb

## 2011-02-24 DIAGNOSIS — R030 Elevated blood-pressure reading, without diagnosis of hypertension: Secondary | ICD-10-CM | POA: Diagnosis not present

## 2011-02-24 DIAGNOSIS — E039 Hypothyroidism, unspecified: Secondary | ICD-10-CM

## 2011-02-24 DIAGNOSIS — I8390 Asymptomatic varicose veins of unspecified lower extremity: Secondary | ICD-10-CM | POA: Insufficient documentation

## 2011-02-24 DIAGNOSIS — E7800 Pure hypercholesterolemia, unspecified: Secondary | ICD-10-CM

## 2011-02-24 NOTE — Assessment & Plan Note (Signed)
Good control today Disc inc water intake/ dec sodium in diet and work on wt loss

## 2011-02-24 NOTE — Assessment & Plan Note (Signed)
Overall stable  Optimally would like LDL under 100 Pt will check on coverage of lipitor or crestor Disc goals for lipids and reasons to control them Rev labs with pt Rev low sat fat diet in detail  Given handout

## 2011-02-24 NOTE — Assessment & Plan Note (Signed)
Clusters of spider veins in lateral and post legs are bothering her  Reassuring exam rec supp hose/elevation/ water/ less sodium/ wt loss If ins covers she would consider vein clinic ref

## 2011-02-24 NOTE — Progress Notes (Signed)
Subjective:    Patient ID: Diamond Boyd, female    DOB: 23-Nov-1940, 71 y.o.   MRN: [id number]  HPI Here for 3 mo f/u of lipids and bp  Doing well overall - just has chronic issues with leg pains and varicose veins -- spider veins in R lateral thigh At times - touchy  Feet stay cold  Usually wears compression hose  At times feet swell   bp is   132/74  Today No cp or palpitations or headaches or edema  No medicines currently Wt is stable with bmi of 38  Lipids are improved on zocor and diet Lab Results  Component Value Date   CHOL 214* 02/19/2011   CHOL 216* 11/12/2010   CHOL 251* 07/17/2010   Lab Results  Component Value Date   HDL 56.60 02/19/2011   HDL 59.40 11/12/2010   HDL 40.98 07/17/2010   Lab Results  Component Value Date   LDLCALC 105* 07/18/2009   LDLCALC 100* 07/05/2008   LDLCALC 106* 06/10/2007   Lab Results  Component Value Date   TRIG 185.0* 02/19/2011   TRIG 208.0* 11/12/2010   TRIG 178.0* 07/17/2010   Lab Results  Component Value Date   CHOLHDL 4 02/19/2011   CHOLHDL 4 11/12/2010   CHOLHDL 4 07/17/2010   Lab Results  Component Value Date   LDLDIRECT 126.0 02/19/2011   LDLDIRECT 133.8 11/12/2010   LDLDIRECT 172.3 07/17/2010   slt imp- overall stable LDL 126 Diet - has been watching fried foods very carefully - eats very rarely  No shell fish  No red meat  Not a lot of eggs    Hypothyroid No dose changes Lab Results  Component Value Date   TSH 1.25 07/17/2010    Patient Active Problem List  Diagnoses  . MALIGNANT NEOPLASM OF TRANSVERSE COLON  . LYMPHOMA NEC, MLIG, SPLEEN  . HYPERCHOLESTEROLEMIA, PURE  . DISORDERS OF PHOSPHORUS METABOLISM  . Anxiety state, unspecified  . DEPRESSION  . PERIPHERAL VASCULAR DISEASE  . Other chronic sinusitis  . ALLERGIC  RHINITIS  . GERD  . OVERACTIVE BLADDER  . MENOPAUSAL SYNDROME  . OSTEOARTHRITIS  . LEG EDEMA  . THROAT PAIN, CHRONIC  . SKIN CANCER, HX OF  . COLONIC POLYPS, HX OF  . Elevated blood pressure  .  Obesity  . Hypothyroid  . Varicose veins   Past Medical History  Diagnosis Date  . Colon polyps   . Depression   . Hypothyroidism   . Osteoarthritis     hands  . Peripheral vascular disease   . Degenerative disk disease     spine in center in past   . Cough   . Cancer     skin cancer- basal cell on arm / colon 2011  . Lymphoma   . Colon cancer    Past Surgical History  Procedure Date  . Cholecystectomy   . Splenectomy     lymphoma  . Breast biopsy 1996  . Plantar fascia release   . Ventral hernia repair 1998  . Tendon release     Right thumb   . Colectomy 8/11  . Vaginal hysterectomy    History  Substance Use Topics  . Smoking status: Never Smoker   . Smokeless tobacco: Not on file  . Alcohol Use: No   Family History  Problem Relation Age of Onset  . Coronary artery disease Mother   . Alcohol abuse Mother   . Cancer Father     jaw  . Diabetes  Brother   . Fibromyalgia Daughter     chronic pain   . COPD Daughter   . Colon cancer Neg Hx   . Colon polyps Neg Hx   . Stomach cancer Neg Hx    Allergies  Allergen Reactions  . Achromycin (Tetracycline Hcl)     Pt does not remember reaction  . Allopurinol     REACTION: Unsure of reaction happene years ago  . Cephalexin     REACTION: unsure of reaction happened yrs ago.  . Ciprofloxacin     REACTION: rash  . Codeine     REACTION: abd. pain  . Keflex     Does not remember  . Meloxicam     REACTION: GI symptoms  . Minocycline     Abdominal pain  . Nabumetone     REACTION: reaction not known  . Penicillins     REACTION: reaction not known  . Sulfonamide Derivatives     Abdominal pain  . Zolpidem Tartrate     REACTION: feels too drugged  . Buspar (Buspirone Hcl)     Dizziness, and not as effective for anxiety   Current Outpatient Prescriptions on File Prior to Visit  Medication Sig Dispense Refill  . Calcium Carbonate-Vitamin D (CALCIUM 600+D) 600-400 MG-UNIT per tablet Take 1 tablet by mouth  daily.        . celecoxib (CELEBREX) 200 MG capsule Take 1 capsule (200 mg total) by mouth daily as needed for pain (with food).  90 capsule  3  . cholecalciferol (VITAMIN D) 1000 UNITS tablet Take 1,000 Units by mouth daily.        Marland Kitchen estradiol (ESTRACE) 2 MG tablet Take 2 mg by mouth 2 (two) times daily.        . fexofenadine (ALLEGRA) 180 MG tablet Take 1 tablet (180 mg total) by mouth daily.  90 tablet  3  . FLUoxetine (PROZAC) 20 MG capsule Take 1 capsule (20 mg total) by mouth 2 (two) times daily.  180 capsule  3  . fluticasone (FLONASE) 50 MCG/ACT nasal spray 1 spray in each nostril twice daily       . levothyroxine (SYNTHROID, LEVOTHROID) 112 MCG tablet Take 1 tablet (112 mcg total) by mouth daily.  90 tablet  3  . Meth-Hyo-M Bl-Benz Acd-Ph Sal (PROSED/DS, ATROPINE FREE,) 81.6 MG TABS Take 1 tablet (81.6 mg total) by mouth 4 (four) times daily.  360 tablet  3  . Omega-3 350 MG CAPS Take by mouth. Take once capsule by mouth once a day       . omeprazole (PRILOSEC) 20 MG capsule Take 1 capsule (20 mg total) by mouth 2 (two) times daily. Take one 30-60 min before first and last meals of the day as long as coughing at all, then once daily before breakfast   180 capsule  3  . simvastatin (ZOCOR) 40 MG tablet Take 1 tablet (40 mg total) by mouth daily.  90 tablet  3  . vitamin E (VITAMIN E) 400 UNIT capsule Take 400 Units by mouth daily.        Marland Kitchen zoster vaccine live, PF, (ZOSTAVAX) 16109 UNT/0.65ML injection Inject 19,400 Units into the skin once.  1 vial  0  . LORazepam (ATIVAN) 1 MG tablet 1/2 to 1 by mouth once daily as needed severe anxiety watch out of sedation       Current Facility-Administered Medications on File Prior to Visit  Medication Dose Route Frequency Provider Last Rate Last Dose  .  0.9 %  sodium chloride infusion  500 mL Intravenous Continuous Louis Meckel, MD           Review of Systems Review of Systems  Constitutional: Negative for fever, appetite change, fatigue  and unexpected weight change.  Eyes: Negative for pain and visual disturbance.  Respiratory: Negative for cough and shortness of breath.   Cardiovascular: Negative for cp or palpitations    Gastrointestinal: Negative for nausea, diarrhea and constipation.  Genitourinary: Negative for urgency and frequency.  Skin: Negative for pallor or rash   Neurological: Negative for weakness, light-headedness, numbness and headaches.  Hematological: Negative for adenopathy. Does not bruise/bleed easily.  Psychiatric/Behavioral: Negative for dysphoric mood. The patient is anxious at times .          Objective:   Physical Exam  Constitutional: She appears well-developed and well-nourished. No distress.  HENT:  Head: Normocephalic and atraumatic.  Mouth/Throat: Oropharynx is clear and moist.  Eyes: Conjunctivae and EOM are normal. Pupils are equal, round, and reactive to light. No scleral icterus.  Neck: Normal range of motion. Neck supple. No JVD present. Carotid bruit is not present. No thyromegaly present.  Cardiovascular: Normal rate, regular rhythm, normal heart sounds and intact distal pulses.  Exam reveals no gallop.   Pulmonary/Chest: Effort normal and breath sounds normal. No respiratory distress. She has no wheezes.  Abdominal: Soft. Bowel sounds are normal. She exhibits no distension, no abdominal bruit and no mass. There is no tenderness.  Musculoskeletal: She exhibits tenderness. She exhibits no edema.       Clusters of spider veins on lateral legs- mildly tender but no ulceration or erythema   Lymphadenopathy:    She has no cervical adenopathy.  Neurological: She is alert. She has normal reflexes. She displays no tremor.  Skin: Skin is warm and dry. No rash noted. No erythema. No pallor.  Psychiatric: She has a normal mood and affect.          Assessment & Plan:

## 2011-02-24 NOTE — Assessment & Plan Note (Signed)
Stable tsh  No change in dose No clinical changes Rev with pt

## 2011-02-24 NOTE — Patient Instructions (Addendum)
If you are interested in a vein clinic referral and your insurance will pay for it - let me know Elevate legs when you can/ avoid salt/ drink more water and wear your support stockings during the day Blood pressure is good  Cholesterol is about the same Continue current medicines  Avoid red meat/ fried foods/ egg yolks/ fatty breakfast meats/ butter, cheese and high fat dairy/ and shellfish   Work on exercise and weight loss  Follow up in 6 months for annual exam with labs prior

## 2011-02-25 DIAGNOSIS — J309 Allergic rhinitis, unspecified: Secondary | ICD-10-CM | POA: Diagnosis not present

## 2011-03-04 DIAGNOSIS — J309 Allergic rhinitis, unspecified: Secondary | ICD-10-CM | POA: Diagnosis not present

## 2011-03-11 DIAGNOSIS — J309 Allergic rhinitis, unspecified: Secondary | ICD-10-CM | POA: Diagnosis not present

## 2011-03-18 DIAGNOSIS — J309 Allergic rhinitis, unspecified: Secondary | ICD-10-CM | POA: Diagnosis not present

## 2011-03-31 ENCOUNTER — Encounter: Payer: Self-pay | Admitting: Family Medicine

## 2011-03-31 ENCOUNTER — Ambulatory Visit (INDEPENDENT_AMBULATORY_CARE_PROVIDER_SITE_OTHER): Payer: Medicare Other | Admitting: Family Medicine

## 2011-03-31 VITALS — BP 120/70 | HR 81 | Temp 98.5°F | Ht 66.0 in | Wt 241.1 lb

## 2011-03-31 DIAGNOSIS — J209 Acute bronchitis, unspecified: Secondary | ICD-10-CM

## 2011-03-31 DIAGNOSIS — Z9089 Acquired absence of other organs: Secondary | ICD-10-CM

## 2011-03-31 DIAGNOSIS — Z9081 Acquired absence of spleen: Secondary | ICD-10-CM

## 2011-03-31 MED ORDER — AZITHROMYCIN 250 MG PO TABS: ORAL_TABLET | ORAL | Status: AC

## 2011-03-31 NOTE — Progress Notes (Signed)
  Patient Name: Diamond Boyd Date of Birth: 12-26-1940 Age: 71 y.o. Medical Record Number: [id number] Gender: female Date of Encounter: 03/31/2011  History of Present Illness:  Diamond Boyd is a 71 y.o. very pleasant female patient who presents with the following:  No spleen  Been sick for about a week. Sore throat and feels raw. Coughing and some congestion.  One side will be stopped up. She is having some significant nasal drainage, some congestion, as well as some significant cough is productive of some sputum. She also has a mild sore throat. She is eating and drinking relatively normally. No nausea, vomiting, or diarrhea. No rashes.   Past Medical History, Surgical History, Social History, Family History, Problem List, Medications, and Allergies have been reviewed and updated if relevant.  Review of Systems: ROS: GEN: Acute illness details above GI: Tolerating PO intake GU: maintaining adequate hydration and urination Pulm: No SOB Interactive and getting along well at home.  Otherwise, ROS is as per the HPI.   Physical Examination: Filed Vitals:   03/31/11 1151  BP: 120/70  Pulse: 81  Temp: 98.5 F (36.9 C)  TempSrc: Oral  Height: 5\' 6"  (1.676 m)  Weight: 241 lb 1.9 oz (109.371 kg)  SpO2: 94%    Body mass index is 38.92 kg/(m^2).   GEN: A and O x 3. WDWN. NAD.    ENT: Nose clear, ext NML.  No LAD.  No JVD.  TM's clear. Oropharynx clear.  PULM: Normal WOB, no distress. No crackles, wheezes, rhonchi. CV: RRR, no M/G/R, No rubs, No JVD.   EXT: warm and well-perfused, No c/c/e. PSYCH: Pleasant and conversant.  Assessment and Plan: 1. Bronchitis, acute   2. Other asplenic status     treat with a Z-Pak. It certainly could be viral, but given the patient's asplenic status, think that it is most reasonable to be conservative

## 2011-04-01 ENCOUNTER — Encounter: Payer: Self-pay | Admitting: Family Medicine

## 2011-04-01 ENCOUNTER — Telehealth: Payer: Self-pay | Admitting: Oncology

## 2011-04-01 DIAGNOSIS — Z9081 Acquired absence of spleen: Secondary | ICD-10-CM

## 2011-04-01 HISTORY — DX: Acquired absence of spleen: Z90.81

## 2011-04-01 NOTE — Telephone Encounter (Signed)
pt called to r/s 2/28 with sara to dm on 3/26   aom

## 2011-04-02 ENCOUNTER — Telehealth: Payer: Self-pay | Admitting: Family Medicine

## 2011-04-02 NOTE — Telephone Encounter (Signed)
If that is the case- stop the nyquil instead/ add to her all list and then update as prev instructed

## 2011-04-02 NOTE — Telephone Encounter (Signed)
Patient notified as instructed by telephone. Nyquil added to allergy list. Pt will call tomorrow with update.

## 2011-04-02 NOTE — Telephone Encounter (Signed)
Stop the zithromax - may be allergic to it  Update tomorrow with how hives are/ take benadryl if able I do not want to start any other medicines until hives go away

## 2011-04-02 NOTE — Telephone Encounter (Signed)
Patient notified as instructed by telephone.Pt said she had the symptoms before she started taking the Zithromax. Pt said she thinks it was the Nyquil that caused the reaction. Pt wants to know if she should continue the Zithromax.

## 2011-04-02 NOTE — Telephone Encounter (Signed)
Triage Record Num: [id number] Operator: Chevis Pretty Patient Name: Diamond Boyd Call Date & Time: 04/02/2011 1:47:17PM Patient Phone: [phone] PCP: Ruthe Mannan Patient Gender: Female PCP Fax : [phone] Patient DOB: September 30, 1940 Practice Name: [facility] Reason for Call: Caller: [name]; PCP: Ruthe Mannan Nestor Ramp); CB#: [phone]; ; ; Call regarding Sinus Infection Follow Up; states has developed vaginal irritation and itchiness. States she also has developed generalized hives. Denies new wheezing, facial swelling, or other emergent symtpoms per protocol; advised appt within 4 hours; note to office per staff for call back, as appts not available in system. Protocol(s) Used: Hives Recommended Outcome per Protocol: Call Provider within 4 Hours Reason for Outcome: New onset of hives after beginning new prescribed, nonprescribed, or alternative/complementary medication Care Advice: Cool/tepid showers or baths may help relieve itching. If cool water alone does not relieve itching, try adding 1/2 to 1 cup baking soda or colloidal oatmeal (Aveeno) to bath water. ~ ~ Speak with provider before next dose of medication is due. ~ Should not be alone for the next 24 hours in case symptoms worsen. ~ IMMEDIATE ACTION ~ CAUTIONS ~ List, or take, all current prescription(s), nonprescription or alternative medication(s) to provider for evaluation. If an allergy is identified, tell all healthcare providers of your allergy. Even if a first-time reaction caused mild symptoms, a future response to the same allergen may cause more serious symptoms. Wear medical identification to alert others in case of an emergency. ~ Call EMS 911 if develop signs and symptoms of anaphylaxis within minutes to several hours of exposure: severe difficulty breathing; rapid, weak or irregular pulse; pruritus, urticaria, swelling of face, lips, tongue, or throat causing tightness or difficulty  swallowing; abdominal cramping, nausea, vomiting or diarrhea. ~ If immediately available, consider taking an appropriate dose of a nonprescription antihistamine (e.g., Benadryl) orally as directed by the label or a pharmacist. These medications may cause drowsiness and should be taken with caution by adults 75 years or older. ~ 04/02/2011 2:02:38PM Page 1 of 1 [email or web address]

## 2011-04-03 NOTE — Telephone Encounter (Signed)
Pt said she feels OK today. Still some hives but better than yesterday. Pt will continue zithromax and if has any further problems or does not clear up completely pt will call back.

## 2011-04-03 NOTE — Telephone Encounter (Signed)
Patient notified as instructed by telephone. Pt will call back after finishes antibiotic; sooner if problems.

## 2011-04-03 NOTE — Telephone Encounter (Signed)
Ok - keep me updated Have her get a monistat product for yeast for her vaginal sympotms

## 2011-04-10 ENCOUNTER — Other Ambulatory Visit: Payer: Medicare Other | Admitting: Lab

## 2011-04-10 ENCOUNTER — Ambulatory Visit: Payer: Medicare Other | Admitting: Physician Assistant

## 2011-04-10 ENCOUNTER — Ambulatory Visit: Payer: Medicare Other | Admitting: Oncology

## 2011-04-15 DIAGNOSIS — J309 Allergic rhinitis, unspecified: Secondary | ICD-10-CM | POA: Diagnosis not present

## 2011-04-24 DIAGNOSIS — J309 Allergic rhinitis, unspecified: Secondary | ICD-10-CM | POA: Diagnosis not present

## 2011-04-24 DIAGNOSIS — J301 Allergic rhinitis due to pollen: Secondary | ICD-10-CM | POA: Diagnosis not present

## 2011-04-24 DIAGNOSIS — J3089 Other allergic rhinitis: Secondary | ICD-10-CM | POA: Diagnosis not present

## 2011-04-29 DIAGNOSIS — J309 Allergic rhinitis, unspecified: Secondary | ICD-10-CM | POA: Diagnosis not present

## 2011-05-06 ENCOUNTER — Ambulatory Visit (HOSPITAL_BASED_OUTPATIENT_CLINIC_OR_DEPARTMENT_OTHER): Payer: Medicare Other | Admitting: Oncology

## 2011-05-06 ENCOUNTER — Other Ambulatory Visit: Payer: Medicare Other | Admitting: Lab

## 2011-05-06 ENCOUNTER — Encounter: Payer: Self-pay | Admitting: Oncology

## 2011-05-06 ENCOUNTER — Telehealth: Payer: Self-pay | Admitting: Oncology

## 2011-05-06 VITALS — BP 145/71 | HR 82 | Temp 97.5°F | Ht 65.0 in | Wt 241.0 lb

## 2011-05-06 DIAGNOSIS — D649 Anemia, unspecified: Secondary | ICD-10-CM | POA: Diagnosis not present

## 2011-05-06 DIAGNOSIS — C8589 Other specified types of non-Hodgkin lymphoma, extranodal and solid organ sites: Secondary | ICD-10-CM | POA: Diagnosis not present

## 2011-05-06 DIAGNOSIS — C8587 Other specified types of non-Hodgkin lymphoma, spleen: Secondary | ICD-10-CM | POA: Diagnosis not present

## 2011-05-06 DIAGNOSIS — Z862 Personal history of diseases of the blood and blood-forming organs and certain disorders involving the immune mechanism: Secondary | ICD-10-CM

## 2011-05-06 DIAGNOSIS — C184 Malignant neoplasm of transverse colon: Secondary | ICD-10-CM

## 2011-05-06 DIAGNOSIS — C189 Malignant neoplasm of colon, unspecified: Secondary | ICD-10-CM | POA: Diagnosis not present

## 2011-05-06 LAB — CBC WITH DIFFERENTIAL/PLATELET
Basophils Absolute: 0 10*3/uL (ref 0.0–0.1)
EOS%: 2.9 % (ref 0.0–7.0)
HCT: 38.4 % (ref 34.8–46.6)
HGB: 12.9 g/dL (ref 11.6–15.9)
LYMPH%: 48.3 % (ref 14.0–49.7)
MCH: 31.1 pg (ref 25.1–34.0)
MCHC: 33.6 g/dL (ref 31.5–36.0)
MCV: 92.5 fL (ref 79.5–101.0)
MONO%: 10.9 % (ref 0.0–14.0)
NEUT%: 37.4 % — ABNORMAL LOW (ref 38.4–76.8)

## 2011-05-06 LAB — COMPREHENSIVE METABOLIC PANEL
Albumin: 4.4 g/dL (ref 3.5–5.2)
CO2: 27 mEq/L (ref 19–32)
Chloride: 104 mEq/L (ref 96–112)
Glucose, Bld: 176 mg/dL — ABNORMAL HIGH (ref 70–99)
Potassium: 4.3 mEq/L (ref 3.5–5.3)
Sodium: 137 mEq/L (ref 135–145)
Total Protein: 6.1 g/dL (ref 6.0–8.3)

## 2011-05-06 LAB — FERRITIN: Ferritin: 150 ng/mL (ref 10–291)

## 2011-05-06 LAB — LACTATE DEHYDROGENASE: LDH: 127 U/L (ref 94–250)

## 2011-05-06 NOTE — Telephone Encounter (Signed)
Gv pt appt for july2013 °

## 2011-05-06 NOTE — Progress Notes (Signed)
CC:   Diamond A. Milinda Antis, MD Diamond Boyd. Diamond Boyd, M.D. Diamond Boyd. Diamond Dice, MD,FACG Diamond Face, MD  PROBLEM LIST: 1. 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  Both tumors were     associated with negative lymph nodes.  One tumor was T2 N0.  The     other tumor was T3 N0.  The T2 N0 stage I tumor was in the sigmoid     colon.  The T3 N0 stage II tumor was in the transverse colon.  The     smaller tumor measured 1.5 cm.  The larger tumor measured 6.5 cm.     Surgery was carried out on 09/28/2009.  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 2. 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     The patient had bone marrow involvement and splenomegaly at that     time, also a hemolytic anemia.  She underwent a splenectomy on     November 12, 1994.  Through the years, the patient has received     Rituxan most recently, dating back to May 2006.  The patient     received fludarabine in late 2002 and early 2003.  She may have     developed a pneumonitis from the fludarabine in December 2002.  The     patient has had involvement of the peripheral blood with villous     lymphocytes.  She appears to be in a state of complete remission at     this time.  Tumor is CD5 and CD20 positive, also CD11c and FMC7     positive. 3. 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 4. History of lupus anticoagulant dating back to the 1990s. 5. History of iron deficiency anemia for which the patient received IV     Feraheme in January 2012.  Presumably, this occurred as a result of     her colon cancer. 6. History of GERD resulting in cough diagnosed around 2010. 7. Dyslipidemia. 8. Hypothyroidism. 9. Irritable bowel syndrome. 10.She has a goiter seen on PET scan from 09/04/2009. 11.Enlarged  left tonsil diagnosed April 2011, currently resolved. 12.History of depression.  MEDICATIONS: 1. Calcium carbonate and vitamin D 600/400 one tablet daily. 2. Celebrex 200 mg daily as needed. 3. Cholecalciferol 1000 units daily. 4. Estrace 2 mg twice daily. 5. Allegra 180 mg daily. 6. Prozac 20 mg twice daily. 7. Flonase nasal spray twice daily. 8. Levothyroxine 112 mcg daily. 9. Ativan 0.5 to 1 mg daily as needed. 10.Omega-3 350 mg daily. 11.Prilosec 20 mg twice daily. 12.Zocor 40 mg daily. 13.Vitamin E 400 units by mouth daily.  It appears that the patient had Pneumovax in September 1996, also on January 08, 2001.  I have asked the patient to check with her primary physician, Dr. Milinda Boyd, as to whether she needs additional Pneumovax.  The patient was encouraged to have yearly flu shot.  The patient had received pneumococcal vaccine in September 1996.    HISTORY:  Diamond Boyd is now 71 year old, was last seen by [place] on 12/09/2010 for followup of her synchronous colon cancers dating back approximately 2 years ago and her long history of a marginal zone lymphoma involving bone marrow and  peripheral blood.  She has had circulating villous lymphocytes in the past.  Her diagnosis goes back probably about 20 years.  In any event, Diamond Boyd is doing well.  Since her last visit here 4 months ago, there has been no change in her condition. No health problems.  She is without complaints.  PHYSICAL EXAM:  General:  She looks well with no obvious change.  Vital Signs:  Weight is 241 pounds which is stable.  Height 5 feet 5 inches, body surface area 2.24 sq m.  Blood pressure 145/71.  Other vital signs are normal.  HEENT:  There is no scleral icterus.  Mouth and pharynx are benign.  There is no peripheral adenopathy palpable.  Heart/lungs: Normal.  Breasts were not examined.  The patient does have yearly mammograms most recently carried out on 08/05/2010.  Abdomen:  Obese, nontender  with no organomegaly or masses palpable.  Extremities:  Puffy without actual pitting edema.  No clubbing.  No axillary or inguinal adenopathy.  Neurologic:  Grossly normal.  LABORATORY DATA:  Today, white count 7.3, ANC 2.7 hemoglobin 12.9, hematocrit 38.4, platelets 239,000.  Chemistries and iron studies are pending.  Chemistries from 02/19/2011 were essentially normal.  BUN was 11, creatinine 0.7, and creatinine clearance of approximately 88 mL/min. On 12/09/2010, iron saturation was 36%, ferritin 166.  On 05/27/2010, ferritin was 181.  CEA on 12/09/2010 was 1.8.  IMAGING STUDIES:  1. Digital screening mammogram from 08/05/2010 was negative.  2. Chest x-ray, 2 view, from 12/09/2010 was negative.   IMPRESSION AND PLAN:  Diamond Boyd continues to do well without evidence for recurrent colon cancer.  Her marginal zone lymphoma appears to be quiescent, has not required therapy in several years dating back, I believe, to May 2006.  At that time, she received Rituxan.  In the distant past, Diamond Boyd received treatment with chlorambucil. I should mention that Diamond Boyd apparently underwent a colonoscopy by Dr. Melvia Heaps on October 10, 2010.  We will plan to see Diamond Boyd again in 4 months at which time we will check CBC, chemistries, and iron studies.  We will plan to see Diamond Boyd again in 4 months at which time we will check CBC, chemistries, CEA, and iron studies.  She will be due for mammograms in June or  July 2013.  She will be due for another chest x-ray in October or November 2013.  I recommended to Diamond Boyd that she continue to get yearly flu shots.  I asked her to check with Dr.  Milinda Boyd to see when she will again be due for Pneumovax.    ______________________________ Samul Dada, M.D. DSM/MEDQ  D:  05/06/2011  T:  05/06/2011  Job:  [id number]

## 2011-05-06 NOTE — Progress Notes (Signed)
This office note has been dictated.  #[id number]

## 2011-05-08 DIAGNOSIS — J309 Allergic rhinitis, unspecified: Secondary | ICD-10-CM | POA: Diagnosis not present

## 2011-05-11 NOTE — Progress Notes (Signed)
Can you please call pt and tell her I think she is due for pneumovax- think last was 2002  Please schedule nurse visit for that t thanks

## 2011-05-12 NOTE — Progress Notes (Signed)
Called and spoke with patients spouse and advised as instructed, he will have her call back to schedule nurse visit for pneumonia shot.

## 2011-05-13 DIAGNOSIS — J309 Allergic rhinitis, unspecified: Secondary | ICD-10-CM | POA: Diagnosis not present

## 2011-05-20 DIAGNOSIS — J309 Allergic rhinitis, unspecified: Secondary | ICD-10-CM | POA: Diagnosis not present

## 2011-05-28 ENCOUNTER — Telehealth: Payer: Self-pay

## 2011-05-28 NOTE — Telephone Encounter (Signed)
Pt has sinus congestion,face hurts on left side and h/a, no fever. Pt already has appt to see Dr Milinda Antis on 05/30/11 but wondered if could get Z pak called in. I explained do not usually give antibiotic without being seen. Pt just got saline spray and Tylenol. Will try that and see Dr Milinda Antis on Fri. If condition changes or worsens will call back.

## 2011-05-28 NOTE — Telephone Encounter (Signed)
I do have to see her - thanks  If her symptoms become severe or high fever - alert [place] or seek care early  (she has hx of lymphoma and is very cautious)

## 2011-05-28 NOTE — Telephone Encounter (Signed)
Patient advised as instructed via telephone. 

## 2011-05-30 ENCOUNTER — Encounter: Payer: Self-pay | Admitting: Family Medicine

## 2011-05-30 ENCOUNTER — Ambulatory Visit (INDEPENDENT_AMBULATORY_CARE_PROVIDER_SITE_OTHER): Payer: Medicare Other | Admitting: Family Medicine

## 2011-05-30 VITALS — BP 120/82 | HR 85 | Temp 98.2°F | Ht 66.0 in | Wt 240.0 lb

## 2011-05-30 DIAGNOSIS — B9689 Other specified bacterial agents as the cause of diseases classified elsewhere: Secondary | ICD-10-CM

## 2011-05-30 DIAGNOSIS — J019 Acute sinusitis, unspecified: Secondary | ICD-10-CM | POA: Diagnosis not present

## 2011-05-30 DIAGNOSIS — B309 Viral conjunctivitis, unspecified: Secondary | ICD-10-CM | POA: Insufficient documentation

## 2011-05-30 MED ORDER — AZITHROMYCIN 250 MG PO TABS: ORAL_TABLET | ORAL | Status: DC

## 2011-05-30 NOTE — Assessment & Plan Note (Signed)
With mild redness / irritation and watering in conj with viral uri Disc symptomatic care - see instructions on AVS  Also disc hygiene Update if not starting to improve in a week or if worsening  - or if vision change

## 2011-05-30 NOTE — Progress Notes (Signed)
Subjective:    Patient ID: Diamond Boyd, female    DOB: 1940-09-05, 71 y.o.   MRN: [id number]  HPI Started this episode about 3 days ago -- headache  Worse on the L side  Is sore to the touch  No fever  Some chills, aches, no sweats   Eyes are bothering her  Sore and red  They are watering a lot - little mucous  Came 3 days ago also   Stuffy and runny nose  Mostly clear- a little yellow   No rash  Is coughing some - not too bad with that  Tried a few benadryl/ tylenol and extra C   Usually does not have allergies with pollen ? - unsure   Patient Active Problem List  Diagnoses  . MALIGNANT NEOPLASM OF TRANSVERSE COLON  . LYMPHOMA NEC, MLIG, SPLEEN  . HYPERCHOLESTEROLEMIA, PURE  . DISORDERS OF PHOSPHORUS METABOLISM  . Anxiety state, unspecified  . DEPRESSION  . PERIPHERAL VASCULAR DISEASE  . Other chronic sinusitis  . ALLERGIC  RHINITIS  . GERD  . OVERACTIVE BLADDER  . MENOPAUSAL SYNDROME  . OSTEOARTHRITIS  . LEG EDEMA  . THROAT PAIN, CHRONIC  . SKIN CANCER, HX OF  . COLONIC POLYPS, HX OF  . Elevated blood pressure  . Obesity  . Hypothyroid  . Varicose veins  . Other asplenic status  . History of anemia  . Acute bacterial sinusitis  . Viral conjunctivitis   Past Medical History  Diagnosis Date  . Colon polyps   . Depression   . Hypothyroidism   . Osteoarthritis     hands  . Peripheral vascular disease   . Degenerative disk disease     spine in center in past   . Cancer     skin cancer- basal cell on arm / colon 2011  . Lymphoma   . Colon cancer   . 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 Cholecystectomy   . Splenectomy     lymphoma  . Breast biopsy 1996  . Plantar fascia release   . Ventral hernia repair 1998  . Tendon release     Right thumb   . Colectomy 8/11  . Vaginal hysterectomy    History  Substance Use Topics  . Smoking status: Never Smoker   . Smokeless tobacco: Not on file  . Alcohol  Use: No   Family History  Problem Relation Age of Onset  . Coronary artery disease Mother   . Alcohol abuse Mother   . Cancer Father     jaw  . Diabetes Brother   . Fibromyalgia Daughter     chronic pain   . COPD Daughter   . Colon cancer Neg Hx   . Colon polyps Neg Hx   . Stomach cancer Neg Hx    Allergies  Allergen Reactions  . Achromycin (Tetracycline Hcl)     Pt does not remember reaction  . Allopurinol     REACTION: Unsure of reaction happene years ago  . Cephalexin     REACTION: unsure of reaction happened yrs ago.  . Ciprofloxacin     REACTION: rash  . Codeine     REACTION: abd. pain  . Keflex     Does not remember  . Meloxicam     REACTION: GI symptoms  . Minocycline     Abdominal pain  . Nabumetone     REACTION: reaction not known  . Nyquil (Nite Time) Hives  .  Penicillins     REACTION: reaction not known  . Sulfonamide Derivatives     Abdominal pain  . Zolpidem Tartrate     REACTION: feels too drugged  . Buspar (Buspirone Hcl)     Dizziness, and not as effective for anxiety   Current Outpatient Prescriptions on File Prior to Visit  Medication Sig Dispense Refill  . Calcium Carbonate-Vitamin D (CALCIUM 600+D) 600-400 MG-UNIT per tablet Take 1 tablet by mouth daily.        . celecoxib (CELEBREX) 200 MG capsule Take 1 capsule (200 mg total) by mouth daily as needed for pain (with food).  90 capsule  3  . cholecalciferol (VITAMIN D) 1000 UNITS tablet Take 1,000 Units by mouth daily.        Marland Kitchen estradiol (ESTRACE) 2 MG tablet Take 2 mg by mouth 2 (two) times daily.        . fexofenadine (ALLEGRA) 180 MG tablet Take 1 tablet (180 mg total) by mouth daily.  90 tablet  3  . FLUoxetine (PROZAC) 20 MG capsule Take 1 capsule (20 mg total) by mouth 2 (two) times daily.  180 capsule  3  . fluticasone (FLONASE) 50 MCG/ACT nasal spray 1 spray in each nostril twice daily       . levothyroxine (SYNTHROID, LEVOTHROID) 112 MCG tablet Take 1 tablet (112 mcg total) by mouth  daily.  90 tablet  3  . LORazepam (ATIVAN) 1 MG tablet 1/2 to 1 by mouth once daily as needed severe anxiety watch out of sedation      . Meth-Hyo-M Bl-Benz Acd-Ph Sal (PROSED/DS, ATROPINE FREE,) 81.6 MG TABS Take 1 tablet (81.6 mg total) by mouth 4 (four) times daily.  360 tablet  3  . Omega-3 350 MG CAPS Take by mouth. Take once capsule by mouth once a day       . omeprazole (PRILOSEC) 20 MG capsule Take 1 capsule (20 mg total) by mouth 2 (two) times daily. Take one 30-60 min before first and last meals of the day as long as coughing at all, then once daily before breakfast   180 capsule  3  . simvastatin (ZOCOR) 40 MG tablet Take 1 tablet (40 mg total) by mouth daily.  90 tablet  3  . vitamin E (VITAMIN E) 400 UNIT capsule Take 400 Units by mouth daily.         Current Facility-Administered Medications on File Prior to Visit  Medication Dose Route Frequency Provider Last Rate Last Dose  . 0.9 %  sodium chloride infusion  500 mL Intravenous Continuous Louis Meckel, MD         Review of Systems Review of Systems  Constitutional: Negative for fever, appetite change, and unexpected weight change.  Eyes: Negative for pain and visual disturbance.pos for itchy/ watery eyes  ENT pos for cong and sinus pain/ neg for ST  Respiratory: Negative for sob or wheeze  Cardiovascular: Negative for cp or palpitations    Gastrointestinal: Negative for nausea, diarrhea and constipation.  Genitourinary: Negative for urgency and frequency.  Skin: Negative for pallor or rash   Neurological: Negative for weakness, light-headedness, numbness and headaches.  Hematological: Negative for adenopathy. Does not bruise/bleed easily.  Psychiatric/Behavioral: Negative for dysphoric mood. The patient is not nervous/anxious.          Objective:   Physical Exam  Constitutional: She appears well-developed and well-nourished.  HENT:  Head: Normocephalic and atraumatic.  Right Ear: External ear normal.  Left Ear:  External ear normal.  Mouth/Throat: No oropharyngeal exudate.       Nares are injected and congested  bilat maxillary sinus tenderness - worse on L  Post nasal drip   Eyes: EOM are normal. Pupils are equal, round, and reactive to light. Right eye exhibits discharge. Left eye exhibits discharge.       Eyes - conj injected bilat with watering (clear tears) No swelling or vision change   Neck: Normal range of motion. Neck supple. No JVD present. No thyromegaly present.  Cardiovascular: Normal rate and regular rhythm.   Pulmonary/Chest: Effort normal and breath sounds normal. No respiratory distress. She has no wheezes. She has no rales. She exhibits no tenderness.  Lymphadenopathy:    She has no cervical adenopathy.  Neurological: She is alert.  Skin: Skin is warm and dry. No rash noted. No erythema. No pallor.  Psychiatric: She has a normal mood and affect.          Assessment & Plan:

## 2011-05-30 NOTE — Patient Instructions (Signed)
For viral pink eye -- use artificial tears over the counter as often as you want, also cool compresses help  Wipe away discharge and crust with a clean cloth  Keep hands off your face  Wash linens and towels frequently  Take the zpak for sinus infection  For the cold you can use nasal saline spray/ mucinex/ tylenol for aches or fever Lots of rest and fluids  Update if not starting to improve in a week or if worsening

## 2011-05-30 NOTE — Assessment & Plan Note (Signed)
With uri and L facial pain / low grade fever at home Pt has asplenia and hx of lymphoma -so tend to tx conservatively zpak (multi drug all)  Disc symptomatic care - see instructions on AVS  Update if not starting to improve in a week or if worsening

## 2011-06-04 ENCOUNTER — Ambulatory Visit (INDEPENDENT_AMBULATORY_CARE_PROVIDER_SITE_OTHER): Payer: Medicare Other | Admitting: Family Medicine

## 2011-06-04 ENCOUNTER — Encounter: Payer: Self-pay | Admitting: Family Medicine

## 2011-06-04 VITALS — BP 140/72 | HR 87 | Temp 97.5°F | Ht 66.0 in | Wt 239.8 lb

## 2011-06-04 DIAGNOSIS — M259 Joint disorder, unspecified: Secondary | ICD-10-CM

## 2011-06-04 DIAGNOSIS — M25512 Pain in left shoulder: Secondary | ICD-10-CM

## 2011-06-04 DIAGNOSIS — M7540 Impingement syndrome of unspecified shoulder: Secondary | ICD-10-CM

## 2011-06-04 DIAGNOSIS — M25519 Pain in unspecified shoulder: Secondary | ICD-10-CM | POA: Diagnosis not present

## 2011-06-04 DIAGNOSIS — M719 Bursopathy, unspecified: Secondary | ICD-10-CM | POA: Diagnosis not present

## 2011-06-04 DIAGNOSIS — M19019 Primary osteoarthritis, unspecified shoulder: Secondary | ICD-10-CM

## 2011-06-04 NOTE — Progress Notes (Signed)
Patient Name: Diamond Boyd Date of Birth: [birth year] Age: 71 y.o. Medical Record Number: [id number] Gender: female Date of Encounter: 06/04/2011  History of Present Illness:  Diamond Boyd is a 71 y.o. very pleasant female patient who presents with the following:  Pleasant patient who I've seen a few other times for different musculoskeletal complaints. She presents today with LEFT shoulder pain has been ongoing for the last month or 2. But did do a subacromial injection approximately 2 years ago, and the patient had an excellent relief of symptoms from this. At that time, and historically, she has not been very good about doing a home exercise program.  She now complains mostly of pain in his T. Shirt distribution around her shoulder. Pain with abduction and reaching across the body. Her strength is maintained throughout.  Past Medical History, Surgical History, Social History, Family History, Problem List, Medications, and Allergies have been reviewed and updated if relevant.  Review of Systems:  GEN: No fevers, chills. Nontoxic. Primarily MSK c/o today. MSK: Detailed in the HPI GI: tolerating PO intake without difficulty Neuro: No numbness, parasthesias, or tingling associated. Otherwise the pertinent positives of the ROS are noted above.    Physical Examination: Filed Vitals:   06/04/11 1201  BP: 140/72  Pulse: 87  Temp: 97.5 F (36.4 C)  TempSrc: Oral  Height: 5\' 6"  (1.676 m)  Weight: 239 lb 12.8 oz (108.773 kg)  SpO2: 98%    Body mass index is 38.70 kg/(m^2).   GEN: Well-developed,well-nourished,in no acute distress; alert,appropriate and cooperative throughout examination HEENT: Normocephalic and atraumatic without obvious abnormalities. Ears, externally no deformities PULM: Breathing comfortably in no respiratory distress EXT: No clubbing, cyanosis, or edema PSYCH: Normally interactive. Cooperative during the interview. Pleasant. Friendly and conversant.  Not anxious or depressed appearing. Normal, full affect.  Shoulder: L Inspection: No muscle wasting or winging Ecchymosis/edema: neg  AC joint, scapula, clavicle: NT Cervical spine: NT, full ROM Spurling's: neg Abduction: full, 5/5 Flexion: full, 5/5 IR, full, lift-off: 5/5 ER at neutral: full, 5/5 AC crossover: pos Neer: pos Hawkins: pos Drop Test: neg Empty Can: pos Supraspinatus insertion: mild-mod T Bicipital groove: NT Speed's: neg Yergason's: neg Sulcus sign: neg Scapular dyskinesis: none C5-T1 intact  Neuro: Sensation intact Grip 5/5   Assessment and Plan:  1. Shoulder pain, left   2. Rotator cuff impingement syndrome   3. AC joint arthropathy    The patient is in acute pain. I have injected her subacromial space once 2 years ago and she believes that Dr. Magnus Ivan also injected once many years ago.  I spent a great deal of time reviewing the importance of rotator cuff strengthening and scapular stabilization with the patient's. I emphasized that this is the cornerstone of maintenance for impingement symptoms, and that I felt that she would likely need to do some rotator cuff strengthening and scapular stabilization for the rest of her life. If she does not, I suspect she will have persistent symptoms. We also discussed that I do not think that repetitive subacromial injections long-term is a viable option.  If she has return of symptoms, at that point, doing some further advanced imaging and consideration of arthroscopy would be appropriate.  I strongly advise that the patient go and have formal physical therapy. At this time due to some family constraints, she is not able to do that, but she is in 3 - 4 weeks to help set up at referral.  [facility] Injection, LEFT Verbal consent was obtained  from the patient. Risks (including infection), benefits, and alternatives were explained. Patient prepped with Chloraprep and Ethyl Chloride used for anesthesia. The subacromial  space was injected using the posterior approach. The patient tolerated the procedure well and had decreased pain post injection. No complications. Injection: 9 cc of Lidocaine 1% and 1cc of Depo-Medrol 40 mg. Needle: 22 gauge

## 2011-06-19 DIAGNOSIS — J309 Allergic rhinitis, unspecified: Secondary | ICD-10-CM | POA: Diagnosis not present

## 2011-06-24 DIAGNOSIS — J309 Allergic rhinitis, unspecified: Secondary | ICD-10-CM | POA: Diagnosis not present

## 2011-07-03 DIAGNOSIS — J309 Allergic rhinitis, unspecified: Secondary | ICD-10-CM | POA: Diagnosis not present

## 2011-07-08 DIAGNOSIS — J309 Allergic rhinitis, unspecified: Secondary | ICD-10-CM | POA: Diagnosis not present

## 2011-07-15 DIAGNOSIS — J309 Allergic rhinitis, unspecified: Secondary | ICD-10-CM | POA: Diagnosis not present

## 2011-07-17 ENCOUNTER — Telehealth: Payer: Self-pay | Admitting: Family Medicine

## 2011-07-17 DIAGNOSIS — E785 Hyperlipidemia, unspecified: Secondary | ICD-10-CM

## 2011-07-17 DIAGNOSIS — C8587 Other specified types of non-Hodgkin lymphoma, spleen: Secondary | ICD-10-CM

## 2011-07-17 DIAGNOSIS — E039 Hypothyroidism, unspecified: Secondary | ICD-10-CM

## 2011-07-17 DIAGNOSIS — K219 Gastro-esophageal reflux disease without esophagitis: Secondary | ICD-10-CM

## 2011-07-17 DIAGNOSIS — Z13220 Encounter for screening for lipoid disorders: Secondary | ICD-10-CM

## 2011-07-17 NOTE — Telephone Encounter (Signed)
Message copied by Judy Pimple on Thu Jul 17, 2011 10:05 PM ------      Message from: Alvina Chou      Created: Fri Jul 11, 2011 10:13 AM      Regarding: lab orders for Friday, 6-7       Patient is scheduled for CPX labs, please order future labs, Thanks , Camelia Eng

## 2011-07-18 ENCOUNTER — Other Ambulatory Visit (INDEPENDENT_AMBULATORY_CARE_PROVIDER_SITE_OTHER): Payer: Medicare Other

## 2011-07-18 DIAGNOSIS — R030 Elevated blood-pressure reading, without diagnosis of hypertension: Secondary | ICD-10-CM | POA: Diagnosis not present

## 2011-07-18 DIAGNOSIS — E785 Hyperlipidemia, unspecified: Secondary | ICD-10-CM

## 2011-07-18 DIAGNOSIS — K219 Gastro-esophageal reflux disease without esophagitis: Secondary | ICD-10-CM | POA: Diagnosis not present

## 2011-07-18 DIAGNOSIS — E039 Hypothyroidism, unspecified: Secondary | ICD-10-CM

## 2011-07-18 DIAGNOSIS — C8587 Other specified types of non-Hodgkin lymphoma, spleen: Secondary | ICD-10-CM

## 2011-07-18 DIAGNOSIS — Z862 Personal history of diseases of the blood and blood-forming organs and certain disorders involving the immune mechanism: Secondary | ICD-10-CM

## 2011-07-18 LAB — PHOSPHORUS: Phosphorus: 3.8 mg/dL (ref 2.3–4.6)

## 2011-07-18 LAB — COMPREHENSIVE METABOLIC PANEL
ALT: 23 U/L (ref 0–35)
AST: 26 U/L (ref 0–37)
Albumin: 4 g/dL (ref 3.5–5.2)
CO2: 29 mEq/L (ref 19–32)
Calcium: 9.1 mg/dL (ref 8.4–10.5)
Chloride: 106 mEq/L (ref 96–112)
Creatinine, Ser: 0.6 mg/dL (ref 0.4–1.2)
GFR: 100.62 mL/min (ref >60.00)
Potassium: 3.8 mEq/L (ref 3.5–5.1)

## 2011-07-18 LAB — CBC WITH DIFFERENTIAL/PLATELET
Basophils Absolute: 0.1 10*3/uL (ref 0.0–0.1)
Basophils Relative: 0.8 % (ref 0.0–3.0)
Hemoglobin: 12.9 g/dL (ref 12.0–15.0)
Lymphocytes Relative: 44.7 % (ref 12.0–46.0)
Monocytes Relative: 17.2 % — ABNORMAL HIGH (ref 3.0–12.0)
Neutro Abs: 2.2 10*3/uL (ref 1.4–7.7)
Neutrophils Relative %: 33.8 % — ABNORMAL LOW (ref 43.0–77.0)
RBC: 4.11 Mil/uL (ref 3.87–5.11)

## 2011-07-18 LAB — TSH: TSH: 1.03 u[IU]/mL (ref 0.35–5.50)

## 2011-07-18 LAB — LDL CHOLESTEROL, DIRECT: Direct LDL: 134 mg/dL

## 2011-07-18 LAB — LIPID PANEL: HDL: 60.7 mg/dL (ref >39.00)

## 2011-07-22 ENCOUNTER — Other Ambulatory Visit: Payer: Self-pay | Admitting: Family Medicine

## 2011-07-22 ENCOUNTER — Ambulatory Visit (INDEPENDENT_AMBULATORY_CARE_PROVIDER_SITE_OTHER): Payer: Medicare Other | Admitting: Family Medicine

## 2011-07-22 ENCOUNTER — Other Ambulatory Visit (HOSPITAL_COMMUNITY)
Admission: RE | Admit: 2011-07-22 | Discharge: 2011-07-22 | Disposition: A | Discharge status: Home / Self Care | Payer: Medicare Other | Source: Ambulatory Visit | Attending: Family Medicine | Admitting: Family Medicine

## 2011-07-22 ENCOUNTER — Encounter: Payer: Self-pay | Admitting: Family Medicine

## 2011-07-22 VITALS — BP 128/78 | Temp 98.1°F | Resp 76 | Ht 64.75 in | Wt 242.8 lb

## 2011-07-22 DIAGNOSIS — Z1231 Encounter for screening mammogram for malignant neoplasm of breast: Secondary | ICD-10-CM | POA: Insufficient documentation

## 2011-07-22 DIAGNOSIS — E669 Obesity, unspecified: Secondary | ICD-10-CM

## 2011-07-22 DIAGNOSIS — E039 Hypothyroidism, unspecified: Secondary | ICD-10-CM | POA: Diagnosis not present

## 2011-07-22 DIAGNOSIS — Z1159 Encounter for screening for other viral diseases: Secondary | ICD-10-CM | POA: Insufficient documentation

## 2011-07-22 DIAGNOSIS — Z124 Encounter for screening for malignant neoplasm of cervix: Secondary | ICD-10-CM | POA: Insufficient documentation

## 2011-07-22 DIAGNOSIS — Z9089 Acquired absence of other organs: Secondary | ICD-10-CM

## 2011-07-22 DIAGNOSIS — E785 Hyperlipidemia, unspecified: Secondary | ICD-10-CM

## 2011-07-22 DIAGNOSIS — E7800 Pure hypercholesterolemia, unspecified: Secondary | ICD-10-CM

## 2011-07-22 DIAGNOSIS — Z01419 Encounter for gynecological examination (general) (routine) without abnormal findings: Secondary | ICD-10-CM | POA: Insufficient documentation

## 2011-07-22 DIAGNOSIS — Z9081 Acquired absence of spleen: Secondary | ICD-10-CM

## 2011-07-22 DIAGNOSIS — Z1211 Encounter for screening for malignant neoplasm of colon: Secondary | ICD-10-CM | POA: Insufficient documentation

## 2011-07-22 MED ORDER — SIMVASTATIN 40 MG PO TABS: 40.0000 mg | ORAL_TABLET | Freq: Every day | ORAL | Status: DC

## 2011-07-22 MED ORDER — FLUOXETINE HCL 20 MG PO CAPS: 40.0000 mg | ORAL_CAPSULE | Freq: Two times a day (BID) | ORAL | Status: DC

## 2011-07-22 MED ORDER — OMEPRAZOLE 20 MG PO CPDR: 20.0000 mg | DELAYED_RELEASE_CAPSULE | Freq: Two times a day (BID) | ORAL | Status: DC

## 2011-07-22 MED ORDER — FEXOFENADINE HCL 180 MG PO TABS: 180.0000 mg | ORAL_TABLET | Freq: Every day | ORAL | Status: DC

## 2011-07-22 MED ORDER — LEVOTHYROXINE SODIUM 112 MCG PO TABS: 112.0000 ug | ORAL_TABLET | Freq: Every day | ORAL | Status: DC

## 2011-07-22 MED ORDER — FLUTICASONE PROPIONATE 50 MCG/ACT NA SUSP: 2.0000 | Freq: Every day | NASAL | Status: DC

## 2011-07-22 MED ORDER — ESTRADIOL 2 MG PO TABS: 2.0000 mg | ORAL_TABLET | Freq: Two times a day (BID) | ORAL | Status: DC

## 2011-07-22 MED ORDER — METH-HYO-M BL-BENZ ACD-PH SAL 81.6 MG PO TABS: 1.0000 | ORAL_TABLET | Freq: Four times a day (QID) | ORAL | Status: DC

## 2011-07-22 MED ORDER — CELECOXIB 200 MG PO CAPS: 200.0000 mg | ORAL_CAPSULE | Freq: Every day | ORAL | Status: DC | PRN

## 2011-07-22 NOTE — Progress Notes (Signed)
Subjective:    Patient ID: Diamond Boyd, female    DOB: 1940/02/22, 71 y.o.   MRN: [id number]  HPI Here for check up of chronic medical conditions and to review health mt list   bp is good today 128/78  Wt is up 3 lb Obese with bmi of 40 Is not exercising - but could start  - will start walking  Is eating fairly healthy - stays away from greasy and fried foods    Hx of hyperlipidemia  zocor and diet- cannot go up on zocor  Lab Results  Component Value Date   CHOL 204* 07/18/2011   CHOL 214* 02/19/2011   CHOL 216* 11/12/2010   Lab Results  Component Value Date   HDL 60.70 07/18/2011   HDL 56.60 02/19/2011   HDL 59.40 11/12/2010   Lab Results  Component Value Date   LDLCALC 105* 07/18/2009   LDLCALC 100* 07/05/2008   LDLCALC 106* 06/10/2007   Lab Results  Component Value Date   TRIG 140.0 07/18/2011   TRIG 185.0* 02/19/2011   TRIG 208.0* 11/12/2010   Lab Results  Component Value Date   CHOLHDL 3 07/18/2011   CHOLHDL 4 02/19/2011   CHOLHDL 4 11/12/2010   Lab Results  Component Value Date   LDLDIRECT 134.0 07/18/2011   LDLDIRECT 126.0 02/19/2011   LDLDIRECT 133.8 11/12/2010  LDL - is up a little  Eats eggs occas No red meat or fried foods  Eats lean fish and chicken , no shellfish    Sugar is 92- re assuring  Hypothyroid Lab Results  Component Value Date   TSH 1.03 07/18/2011   Clinically- no changes   Needs to eat little meals more frequently to prevenl low sugar symptoms  Zoster status- had her shingles vaccine in 2011 at [facility] Pneumovax 8/11 maningococcal vaccine 09  Pap 6/10 No gyn symptoms , no bleeding or pain    mammo 6/12 [facility]  Self exam  colonosc 8/12 Hx of colon cancer No bowel habit changes   Hx of lymphoma -- doing ok overall Lab Results  Component Value Date   WBC 6.6 07/18/2011   HGB 12.9 07/18/2011   HCT 39.5 07/18/2011   MCV 96.2 07/18/2011   PLT 220.0 07/18/2011   continues f/u Dr Arline Asp   Patient Active Problem List    Diagnosis  . MALIGNANT NEOPLASM OF TRANSVERSE COLON  . LYMPHOMA NEC, MLIG, SPLEEN  . HYPERCHOLESTEROLEMIA, PURE  . DISORDERS OF PHOSPHORUS METABOLISM  . Anxiety state, unspecified  . DEPRESSION  . PERIPHERAL VASCULAR DISEASE  . Other chronic sinusitis  . ALLERGIC  RHINITIS  . GERD  . OVERACTIVE BLADDER  . MENOPAUSAL SYNDROME  . OSTEOARTHRITIS  . LEG EDEMA  . THROAT PAIN, CHRONIC  . SKIN CANCER, HX OF  . COLONIC POLYPS, HX OF  . Elevated blood pressure  . Obesity  . Hypothyroid  . Varicose veins  . Other asplenic status  . History of anemia  . Acute bacterial sinusitis  . Viral conjunctivitis  . Screening for lipoid disorders  . Hyperlipidemia, mild  . Other screening mammogram  . Routine gynecological examination  . Colon cancer screening   Past Medical History  Diagnosis Date  . Colon polyps   . Depression   . Hypothyroidism   . Osteoarthritis     hands  . Peripheral vascular disease   . Degenerative disk disease     spine in center in past   . Cancer  skin cancer- basal cell on arm / colon 2011  . Lymphoma   . Colon cancer   . 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 Cholecystectomy   . Splenectomy     lymphoma  . Breast biopsy 1996  . Plantar fascia release   . Ventral hernia repair 1998  . Tendon release     Right thumb   . Colectomy 8/11  . Vaginal hysterectomy    History  Substance Use Topics  . Smoking status: Never Smoker   . Smokeless tobacco: Not on file  . Alcohol Use: No   Family History  Problem Relation Age of Onset  . Coronary artery disease Mother   . Alcohol abuse Mother   . Cancer Father     jaw  . Diabetes Brother   . Fibromyalgia Daughter     chronic pain   . COPD Daughter   . Colon cancer Neg Hx   . Colon polyps Neg Hx   . Stomach cancer Neg Hx    Allergies  Allergen Reactions  . Achromycin (Tetracycline Hcl)     Pt does not remember reaction  . Allopurinol     REACTION:  Unsure of reaction happene years ago  . Cephalexin     REACTION: unsure of reaction happened yrs ago.  . Cephalexin     Does not remember  . Ciprofloxacin     REACTION: rash  . Codeine     REACTION: abd. pain  . Meloxicam     REACTION: GI symptoms  . Minocycline     Abdominal pain  . Nabumetone     REACTION: reaction not known  . Nyquil (Pseudoeph-Doxylamine-Dm-Apap) Hives  . Penicillins     REACTION: reaction not known  . Sulfonamide Derivatives     Abdominal pain  . Zolpidem Tartrate     REACTION: feels too drugged  . Buspar (Buspirone Hcl)     Dizziness, and not as effective for anxiety   Current Outpatient Prescriptions on File Prior to Visit  Medication Sig Dispense Refill  . Calcium Carbonate-Vitamin D (CALCIUM 600+D) 600-400 MG-UNIT per tablet Take 1 tablet by mouth daily.        . cholecalciferol (VITAMIN D) 1000 UNITS tablet Take 1,000 Units by mouth daily.        Marland Kitchen estradiol (ESTRACE) 2 MG tablet Take 1 tablet (2 mg total) by mouth 2 (two) times daily.  180 tablet  3  . fexofenadine (ALLEGRA) 180 MG tablet Take 1 tablet (180 mg total) by mouth daily.  90 tablet  3  . FLUoxetine (PROZAC) 20 MG capsule Take 2 capsules (40 mg total) by mouth 2 (two) times daily.  180 capsule  3  . fluticasone (FLONASE) 50 MCG/ACT nasal spray Place 2 sprays into the nose daily. 1 spray in each nostril twice daily  48 g  3  . levothyroxine (SYNTHROID, LEVOTHROID) 112 MCG tablet Take 1 tablet (112 mcg total) by mouth daily.  90 tablet  3  . Omega-3 350 MG CAPS Take by mouth. Take once capsule by mouth once a day       . omeprazole (PRILOSEC) 20 MG capsule Take 1 capsule (20 mg total) by mouth 2 (two) times daily. Take one 30-60 min before first and last meals of the day as long as coughing at all, then once daily before breakfast  180 capsule  3  . simvastatin (ZOCOR) 40 MG tablet Take 1 tablet (40 mg  total) by mouth daily.  90 tablet  3  . vitamin E (VITAMIN E) 400 UNIT capsule Take 400  Units by mouth daily.        Marland Kitchen LORazepam (ATIVAN) 1 MG tablet 1/2 to 1 by mouth once daily as needed severe anxiety watch out of sedation       Current Facility-Administered Medications on File Prior to Visit  Medication Dose Route Frequency Provider Last Rate Last Dose  . 0.9 %  sodium chloride infusion  500 mL Intravenous Continuous Louis Meckel, MD         Review of Systems Review of Systems  Constitutional: Negative for fever, appetite change, fatigue and unexpected weight change.  Eyes: Negative for pain and visual disturbance.  Respiratory: Negative for cough and shortness of breath.   Cardiovascular: Negative for cp or palpitations    Gastrointestinal: Negative for nausea, diarrhea and constipation.  Genitourinary: Negative for urgency and frequency.  Skin: Negative for pallor or rash   Neurological: Negative for weakness, light-headedness, numbness and headaches.  Hematological: Negative for adenopathy. Does not bruise/bleed easily.  Psychiatric/Behavioral: Negative for dysphoric mood. The patient is somewhat anxious        Objective:   Physical Exam  Constitutional: She appears well-developed and well-nourished. No distress.       Obese and well appearing  HENT:  Head: Normocephalic and atraumatic.  Right Ear: External ear normal.  Left Ear: External ear normal.  Nose: Nose normal.  Eyes: Conjunctivae and EOM are normal. Pupils are equal, round, and reactive to light. No scleral icterus.  Neck: Normal range of motion. No JVD present. Carotid bruit is not present. No thyromegaly present.  Cardiovascular: Normal rate, regular rhythm, normal heart sounds and intact distal pulses.  Exam reveals no gallop.   Pulmonary/Chest: Effort normal and breath sounds normal. No respiratory distress. She has no wheezes.  Abdominal: Soft. Bowel sounds are normal. She exhibits no distension, no abdominal bruit and no mass. There is no tenderness.  Genitourinary: No breast swelling,  tenderness, discharge or bleeding.       Breast exam: No mass, nodules, thickening, tenderness, bulging, retraction, inflamation, nipple discharge or skin changes noted.  No axillary or clavicular LA.  Chaperoned exam.    o External genitalia nl appearing / no lesion or rash o Urethral meatus nl app/ no lesion o Urethra  Nl app, not hypermobile o Bladder nontender, nl  o Vagina n mucosa/ no discharge or lesions o Cervix surgically absent o Uterus  Surgically absent o Adnexa/parametria unpalpable  o Anus and perineum nl appearing/ no lesions  Musculoskeletal: She exhibits no edema and no tenderness.  Lymphadenopathy:    She has no cervical adenopathy.  Neurological: She is alert. She has normal reflexes. No cranial nerve deficit. She exhibits normal muscle tone. Coordination normal.  Skin: Skin is warm and dry. No rash noted. No erythema. No pallor.  Psychiatric: She has a normal mood and affect.          Assessment & Plan:

## 2011-07-22 NOTE — Patient Instructions (Addendum)
Try to work up to exercise 30 minutes 5 days per week for weight loss Also eat healthy diet  We will schedule mammogram at check out  Do IFOB card for clon cancer screening  Pap smear today

## 2011-07-24 DIAGNOSIS — J309 Allergic rhinitis, unspecified: Secondary | ICD-10-CM | POA: Diagnosis not present

## 2011-07-25 ENCOUNTER — Encounter: Payer: Medicare Other | Admitting: Family Medicine

## 2011-07-31 DIAGNOSIS — J309 Allergic rhinitis, unspecified: Secondary | ICD-10-CM | POA: Diagnosis not present

## 2011-08-01 ENCOUNTER — Encounter: Payer: Self-pay | Admitting: *Deleted

## 2011-08-04 ENCOUNTER — Telehealth: Payer: Self-pay | Admitting: Internal Medicine

## 2011-08-04 NOTE — Telephone Encounter (Signed)
called pt and moved out lab and md visit (for tx pt)aom

## 2011-08-05 DIAGNOSIS — J309 Allergic rhinitis, unspecified: Secondary | ICD-10-CM | POA: Diagnosis not present

## 2011-08-07 ENCOUNTER — Ambulatory Visit: Payer: Medicare Other

## 2011-08-12 DIAGNOSIS — J309 Allergic rhinitis, unspecified: Secondary | ICD-10-CM | POA: Diagnosis not present

## 2011-08-19 DIAGNOSIS — J309 Allergic rhinitis, unspecified: Secondary | ICD-10-CM | POA: Diagnosis not present

## 2011-08-20 ENCOUNTER — Ambulatory Visit
Admission: RE | Admit: 2011-08-20 | Discharge: 2011-08-20 | Disposition: A | Discharge status: Home / Self Care | Payer: Medicare Other | Source: Ambulatory Visit | Attending: Family Medicine | Admitting: Family Medicine

## 2011-08-20 DIAGNOSIS — Z1231 Encounter for screening mammogram for malignant neoplasm of breast: Secondary | ICD-10-CM

## 2011-08-26 DIAGNOSIS — J309 Allergic rhinitis, unspecified: Secondary | ICD-10-CM | POA: Diagnosis not present

## 2011-09-01 ENCOUNTER — Other Ambulatory Visit: Payer: Medicare Other | Admitting: Lab

## 2011-09-01 ENCOUNTER — Ambulatory Visit: Payer: Medicare Other | Admitting: Oncology

## 2011-09-02 DIAGNOSIS — J309 Allergic rhinitis, unspecified: Secondary | ICD-10-CM | POA: Diagnosis not present

## 2011-09-09 DIAGNOSIS — J309 Allergic rhinitis, unspecified: Secondary | ICD-10-CM | POA: Diagnosis not present

## 2011-09-12 ENCOUNTER — Telehealth: Payer: Self-pay | Admitting: Oncology

## 2011-09-12 ENCOUNTER — Ambulatory Visit (HOSPITAL_BASED_OUTPATIENT_CLINIC_OR_DEPARTMENT_OTHER): Payer: Medicare Other | Admitting: Family

## 2011-09-12 ENCOUNTER — Other Ambulatory Visit (HOSPITAL_BASED_OUTPATIENT_CLINIC_OR_DEPARTMENT_OTHER): Payer: Medicare Other | Admitting: Lab

## 2011-09-12 ENCOUNTER — Encounter: Payer: Self-pay | Admitting: Family

## 2011-09-12 VITALS — BP 158/81 | HR 70 | Temp 97.2°F | Resp 20 | Ht 64.75 in | Wt 239.5 lb

## 2011-09-12 DIAGNOSIS — Z862 Personal history of diseases of the blood and blood-forming organs and certain disorders involving the immune mechanism: Secondary | ICD-10-CM | POA: Diagnosis not present

## 2011-09-12 DIAGNOSIS — L989 Disorder of the skin and subcutaneous tissue, unspecified: Secondary | ICD-10-CM

## 2011-09-12 DIAGNOSIS — C8589 Other specified types of non-Hodgkin lymphoma, extranodal and solid organ sites: Secondary | ICD-10-CM | POA: Diagnosis not present

## 2011-09-12 DIAGNOSIS — C184 Malignant neoplasm of transverse colon: Secondary | ICD-10-CM

## 2011-09-12 DIAGNOSIS — C8587 Other specified types of non-Hodgkin lymphoma, spleen: Secondary | ICD-10-CM

## 2011-09-12 LAB — CBC WITH DIFFERENTIAL/PLATELET
Basophils Absolute: 0 10*3/uL (ref 0.0–0.1)
EOS%: 3.8 % (ref 0.0–7.0)
Eosinophils Absolute: 0.3 10*3/uL (ref 0.0–0.5)
HCT: 39.8 % (ref 34.8–46.6)
HGB: 13.2 g/dL (ref 11.6–15.9)
MCH: 31.8 pg (ref 25.1–34.0)
MONO#: 1.4 10*3/uL — ABNORMAL HIGH (ref 0.1–0.9)
NEUT#: 2.7 10*3/uL (ref 1.5–6.5)
NEUT%: 37.4 % — ABNORMAL LOW (ref 38.4–76.8)
lymph#: 2.8 10*3/uL (ref 0.9–3.3)

## 2011-09-12 LAB — COMPREHENSIVE METABOLIC PANEL
Albumin: 4.4 g/dL (ref 3.5–5.2)
BUN: 13 mg/dL (ref 6–23)
CO2: 27 mEq/L (ref 19–32)
Calcium: 9.4 mg/dL (ref 8.4–10.5)
Chloride: 105 mEq/L (ref 96–112)
Creatinine, Ser: 0.78 mg/dL (ref 0.50–1.10)
Glucose, Bld: 95 mg/dL (ref 70–99)

## 2011-09-12 LAB — IRON AND TIBC
Iron: 93 ug/dL (ref 42–145)
UIBC: 231 ug/dL (ref 125–400)

## 2011-09-12 LAB — LACTATE DEHYDROGENASE: LDH: 141 U/L (ref 94–250)

## 2011-09-12 LAB — CEA: CEA: 1.2 ng/mL (ref 0.0–5.0)

## 2011-09-12 NOTE — Progress Notes (Signed)
Patient ID: Diamond Boyd, female   DOB: Feb 06, 1941, 71 y.o.   MRN: [id number] CSN: [id number]  CC: Diamond A. Milinda Antis, MD  Angelia Mould. Derrell Lolling, M.D.  Barbette Hair. Arlyce Dice, MD,FACG  Bud Face, MD  Problem List: Diamond Boyd is a 71 y.o. female with a problem list consisting of:  1. Adenocarcinoma of the colon with 2 synchronous primaries dating back to August 2011 when the patient was found to have anemia which I believe was secondary to iron deficiency. Both tumors were associated with negative lymph nodes. One tumor was T2 N0. The other tumor was T3 N0. The T2 N0 stage I tumor was in the sigmoid colon. The T3 N0 stage II tumor was in the transverse colon. The smaller tumor measured 1.5 cm. The larger tumor measured 6.5 cm. Surgery was carried out on 09/28/2009. 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  2. 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 The patient had bone marrow involvement and splenomegaly at that time, also a hemolytic anemia. She underwent a splenectomy on November 12, 1994. Through the years, the patient has received Rituxan most recently, dating back to May 2006. The patient received fludarabine in late 2002 and early 2003. She may have developed a pneumonitis from the fludarabine in December 2002. The patient has had involvement of the peripheral blood with villous lymphocytes. She appears to be in a state of complete remission at this time. Tumor is CD5 and CD20 positive, also CD11c and FMC7 positive.  3. 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  4. History of lupus anticoagulant dating back to the 1990s.  5. History of iron deficiency anemia for which the patient received IV Feraheme in January 2012. Presumably, this occurred as a result of her colon cancer.  6. History of GERD resulting in cough diagnosed around 2010.  7. Dyslipidemia.  8. Hypothyroidism.  9. Irritable bowel syndrome.    10.She has a goiter seen on PET scan from 09/04/2009.  11.Enlarged left tonsil diagnosed April 2011, currently resolved.  12.History of depression.  The patient and her husband attended today's office visit.  The patient was without complaint or any symptoms.  The patient has noticed one small area of skin on her hand and one raised area on the bridge of her nose that she had questions about.  Dr. Arline Asp and I both examined these areas and could not explain the changes.  The patient was asked to visit a dermatologist soon and yearly thereafter.  The patient had a mammogram July 2013 (results of the exam are listed below).  The patient's last colonoscopy was August 2012 and she is due for another colonoscopy in 2015.  The patient states that she received the influenza vaccine and the pneumonia vaccine last year (2012).  Past Medical History: Past Medical History  Diagnosis Date  . Colon polyps   . Depression   . Hypothyroidism   . Osteoarthritis     hands  . Peripheral vascular disease   . Degenerative disk disease     spine in center in past   . Cancer     skin cancer- basal cell on arm / colon 2011  . Lymphoma   . Colon cancer   . 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 Cholecystectomy   . Splenectomy     lymphoma  . Breast biopsy  1996  . Plantar fascia release   . Ventral hernia repair 1998  . Tendon release     Right thumb   . Colectomy 8/11  . Vaginal hysterectomy     Current Medications: Current Outpatient Prescriptions  Medication Sig Dispense Refill  . Calcium Carbonate-Vitamin D (CALCIUM 600+D) 600-400 MG-UNIT per tablet Take 1 tablet by mouth daily.        . celecoxib (CELEBREX) 200 MG capsule Take 1 capsule (200 mg total) by mouth daily as needed for pain (with food).  90 capsule  3  . cholecalciferol (VITAMIN D) 1000 UNITS tablet Take 1,000 Units by mouth daily.        Marland Kitchen estradiol (ESTRACE) 2 MG tablet  Take 1 tablet (2 mg total) by mouth 2 (two) times daily.  180 tablet  3  . fexofenadine (ALLEGRA) 180 MG tablet Take 1 tablet (180 mg total) by mouth daily.  90 tablet  3  . FLUoxetine (PROZAC) 20 MG capsule Take 2 capsules (40 mg total) by mouth 2 (two) times daily.  180 capsule  3  . fluticasone (FLONASE) 50 MCG/ACT nasal spray Place 2 sprays into the nose daily. 1 spray in each nostril twice daily  48 g  3  . levothyroxine (SYNTHROID, LEVOTHROID) 112 MCG tablet Take 1 tablet (112 mcg total) by mouth daily.  90 tablet  3  . LORazepam (ATIVAN) 1 MG tablet 1/2 to 1 by mouth once daily as needed severe anxiety watch out of sedation      . Meth-Hyo-M Bl-Benz Acd-Ph Sal (PROSED/DS, ATROPINE FREE,) 81.6 MG TABS Take 1 tablet (81.6 mg total) by mouth 4 (four) times daily.  360 tablet  3  . Omega-3 350 MG CAPS Take by mouth. Take once capsule by mouth once a day       . omeprazole (PRILOSEC) 20 MG capsule Take 1 capsule (20 mg total) by mouth 2 (two) times daily. Take one 30-60 min before first and last meals of the day as long as coughing at all, then once daily before breakfast  180 capsule  3  . simvastatin (ZOCOR) 40 MG tablet Take 1 tablet (40 mg total) by mouth daily.  90 tablet  3  . vitamin E (VITAMIN E) 400 UNIT capsule Take 400 Units by mouth daily.         Current Facility-Administered Medications  Medication Dose Route Frequency Provider Last Rate Last Dose  . 0.9 %  sodium chloride infusion  500 mL Intravenous Continuous Louis Meckel, MD        Allergies: Allergies  Allergen Reactions  . Achromycin (Tetracycline Hcl)     Pt does not remember reaction  . Allopurinol     REACTION: Unsure of reaction happene years ago  . Cephalexin     REACTION: unsure of reaction happened yrs ago.  . Ciprofloxacin     REACTION: rash  . Codeine     REACTION: abd. pain  . Meloxicam     REACTION: GI symptoms  . Minocycline     Abdominal pain  . Nabumetone     REACTION: reaction not known  .  Nyquil (Pseudoeph-Doxylamine-Dm-Apap) Hives  . Penicillins     REACTION: reaction not known  . Sulfonamide Derivatives     Abdominal pain  . Zolpidem Tartrate     REACTION: feels too drugged  . Buspar (Buspirone Hcl)     Dizziness, and not as effective for anxiety     Family History: Family History  Problem Relation Age of Onset  . Coronary artery disease Mother   . Alcohol abuse Mother   . Cancer Father     jaw  . Diabetes Brother   . Fibromyalgia Daughter     chronic pain   . COPD Daughter   . Colon cancer Neg Hx   . Colon polyps Neg Hx   . Stomach cancer Neg Hx     Social History: History  Substance Use Topics  . Smoking status: Never Smoker   . Smokeless tobacco: Not on file  . Alcohol Use: No    Review of Systems: 10 Point review of systems was completed and is negative except as noted above.   Physical Exam:   Blood pressure 158/81, pulse 70, temperature 97.2 F (36.2 C), temperature source Oral, resp. rate 20, height 5' 4.75" (1.645 m), weight 239 lb 8 oz (108.636 kg), last menstrual period 02/10/1970.  General appearance: Alert, cooperative, obese, well nourished, well developed, no distress Head: Normocephalic, without obvious abnormality, atraumatic Eyes: Conjunctivae/corneas clear, PERRLA, EOMI, non-icteric sclera Nose: Nares, septum and mucosa are normal, no drainage or sinus tenderness Neck: No adenopathy, supple, symmetrical, trachea midline,thyroid not enlarged, no tenderness, excessive habitus Resp: Clear to auscultation bilaterally Cardio: Regular rate and rhythm, S1, S2 normal, no murmur, click, rub or gallop, distant heart sounds Extremities: Extremities normal, atraumatic, no cyanosis or edema Skin: hyperpigmentation on her hand, nodule on the bridge of her nose Lymph nodes: Cervical, supraclavicular, and axillary nodes normal   Laboratory Data: Results for orders placed in visit on 09/12/11 (from the past 48 hour(s))  CBC WITH  DIFFERENTIAL     Status: Abnormal   Collection Time   09/12/11 10:50 AM      Component Value Range Comment   WBC 7.2  3.9 - 10.3 10e3/uL    NEUT# 2.7  1.5 - 6.5 10e3/uL    HGB 13.2  11.6 - 15.9 g/dL    HCT 16.1  09.6 - 04.5 %    Platelets 229  145 - 400 10e3/uL    MCV 95.6  79.5 - 101.0 fL    MCH 31.8  25.1 - 34.0 pg    MCHC 33.3  31.5 - 36.0 g/dL    RBC 4.09  8.11 - 9.14 10e6/uL    RDW 13.7  11.2 - 14.5 %    lymph# 2.8  0.9 - 3.3 10e3/uL    MONO# 1.4 (*) 0.1 - 0.9 10e3/uL    Eosinophils Absolute 0.3  0.0 - 0.5 10e3/uL    Basophils Absolute 0.0  0.0 - 0.1 10e3/uL    NEUT% 37.4 (*) 38.4 - 76.8 %    LYMPH% 39.3  14.0 - 49.7 %    MONO% 18.8 (*) 0.0 - 14.0 %    EOS% 3.8  0.0 - 7.0 %    BASO% 0.7  0.0 - 2.0 %      Imaging Studies: Mm Digital Screening 08/20/2011  *RADIOLOGY REPORT*  Clinical Data: Screening.  DIGITAL SCREENING MAMMOGRAM WITH CAD  Comparison:  Previous exams  Findings:  There are scattered fibroglandular densities. No suspicious masses, architectural distortion, or calcifications are present.  Images were processed with CAD.  IMPRESSION: No specific mammographic evidence of malignancy.  A result letter of this screening mammogram will be mailed directly to the patient.  RECOMMENDATION: Screening mammogram in one year. (Code:SM-B-01Y)  BI-RADS CATEGORY 1:  Negative  Original Report Authenticated By: Hiram Gash, M.D.     Impression/Plan: Mrs. Teem continues to  do well without evidence for recurrent colon cancer. Her marginal zone lymphoma appears to be quiescent, has not required therapy in several years dating back to May 2006. At that time, she received Rituxan. In the distant past, the patient received treatment with chlorambucil.  Mrs. Shults underwent a colonoscopy by Dr. Melvia Heaps on October 10, 2010. We will plan to see the patient again in 4 months at which time we will check CEA, CBC, CMP, LDH and iron studies.  She is scheduled  for another chest  x-ray in October 2013.  The patient is also asked to follow up with a Dermatologist (she has seen a Dermatologist in [place] previously).   Nardos Putnam NP-C 09/12/2011, 12:04 PM

## 2011-09-12 NOTE — Telephone Encounter (Signed)
appts made and printed for pt,pt aware to get walkin cxr in October   aome

## 2011-09-12 NOTE — Patient Instructions (Addendum)
Patient ID: Diamond Boyd,   DOB: 15-Aug-1940,  MRN: [id number]   [facility] Discharge Instructions  RECOMMENDATIONS MAD BY THE CONSULTANT AND ANY TEST RESULT(S) WILL BE FORWARDED TO YOU REFERRING DOCTOR   EXAM FINDINGS BY NURSE PRACTITIONER TODAY TO REPORT TO THE CLINIC OR PRIMARY PROVIDER:  N/A  Your Current Medications Are: Current Outpatient Prescriptions  Medication Sig Dispense Refill  . Calcium Carbonate-Vitamin D (CALCIUM 600+D) 600-400 MG-UNIT per tablet Take 1 tablet by mouth daily.        . celecoxib (CELEBREX) 200 MG capsule Take 1 capsule (200 mg total) by mouth daily as needed for pain (with food).  90 capsule  3  . cholecalciferol (VITAMIN D) 1000 UNITS tablet Take 1,000 Units by mouth daily.        Marland Kitchen estradiol (ESTRACE) 2 MG tablet Take 1 tablet (2 mg total) by mouth 2 (two) times daily.  180 tablet  3  . fexofenadine (ALLEGRA) 180 MG tablet Take 1 tablet (180 mg total) by mouth daily.  90 tablet  3  . FLUoxetine (PROZAC) 20 MG capsule Take 2 capsules (40 mg total) by mouth 2 (two) times daily.  180 capsule  3  . fluticasone (FLONASE) 50 MCG/ACT nasal spray Place 2 sprays into the nose daily. 1 spray in each nostril twice daily  48 g  3  . levothyroxine (SYNTHROID, LEVOTHROID) 112 MCG tablet Take 1 tablet (112 mcg total) by mouth daily.  90 tablet  3  . LORazepam (ATIVAN) 1 MG tablet 1/2 to 1 by mouth once daily as needed severe anxiety watch out of sedation      . Meth-Hyo-M Bl-Benz Acd-Ph Sal (PROSED/DS, ATROPINE FREE,) 81.6 MG TABS Take 1 tablet (81.6 mg total) by mouth 4 (four) times daily.  360 tablet  3  . Omega-3 350 MG CAPS Take by mouth. Take once capsule by mouth once a day       . omeprazole (PRILOSEC) 20 MG capsule Take 1 capsule (20 mg total) by mouth 2 (two) times daily. Take one 30-60 min before first and last meals of the day as long as coughing at all, then once daily before breakfast  180 capsule  3  . simvastatin (ZOCOR) 40 MG tablet Take 1  tablet (40 mg total) by mouth daily.  90 tablet  3  . vitamin E (VITAMIN E) 400 UNIT capsule Take 400 Units by mouth daily.         Current Facility-Administered Medications  Medication Dose Route Frequency Provider Last Rate Last Dose  . 0.9 %  sodium chloride infusion  500 mL Intravenous Continuous Louis Meckel, MD         INSTRUCTIONS GIVEN, DISCUSSED AND FOLLOW-UP: Please continue doing a great job! Please consider visiting a dermatologist at least every year to evaluate your skin.  I acknowledge that I have been informed and understand all the instructions given to me and have received a copy.  I do not have any further questions at this time, but I understand that I may call the [facility] at [phone] during business hours should I have any further questions or need assistance in obtaining follow-up care.   09/12/2011, 11:44 AM

## 2011-09-16 DIAGNOSIS — J309 Allergic rhinitis, unspecified: Secondary | ICD-10-CM | POA: Diagnosis not present

## 2011-09-23 DIAGNOSIS — J309 Allergic rhinitis, unspecified: Secondary | ICD-10-CM | POA: Diagnosis not present

## 2011-09-30 DIAGNOSIS — J309 Allergic rhinitis, unspecified: Secondary | ICD-10-CM | POA: Diagnosis not present

## 2011-10-07 DIAGNOSIS — J309 Allergic rhinitis, unspecified: Secondary | ICD-10-CM | POA: Diagnosis not present

## 2011-10-14 DIAGNOSIS — J309 Allergic rhinitis, unspecified: Secondary | ICD-10-CM | POA: Diagnosis not present

## 2011-10-21 DIAGNOSIS — J309 Allergic rhinitis, unspecified: Secondary | ICD-10-CM | POA: Diagnosis not present

## 2011-10-28 DIAGNOSIS — J309 Allergic rhinitis, unspecified: Secondary | ICD-10-CM | POA: Diagnosis not present

## 2011-11-04 DIAGNOSIS — J309 Allergic rhinitis, unspecified: Secondary | ICD-10-CM | POA: Diagnosis not present

## 2011-11-11 DIAGNOSIS — J309 Allergic rhinitis, unspecified: Secondary | ICD-10-CM | POA: Diagnosis not present

## 2011-11-18 DIAGNOSIS — J309 Allergic rhinitis, unspecified: Secondary | ICD-10-CM | POA: Diagnosis not present

## 2011-11-19 ENCOUNTER — Ambulatory Visit (HOSPITAL_COMMUNITY)
Admission: RE | Admit: 2011-11-19 | Discharge: 2011-11-19 | Disposition: A | Discharge status: Home / Self Care | Payer: Medicare Other | Source: Ambulatory Visit | Attending: Family | Admitting: Family

## 2011-11-19 DIAGNOSIS — I700 Atherosclerosis of aorta: Secondary | ICD-10-CM | POA: Diagnosis not present

## 2011-11-19 DIAGNOSIS — C184 Malignant neoplasm of transverse colon: Secondary | ICD-10-CM

## 2011-11-19 DIAGNOSIS — Z9089 Acquired absence of other organs: Secondary | ICD-10-CM | POA: Insufficient documentation

## 2011-11-19 DIAGNOSIS — C189 Malignant neoplasm of colon, unspecified: Secondary | ICD-10-CM | POA: Diagnosis not present

## 2011-11-20 NOTE — Progress Notes (Signed)
Quick Note:  Please notify patient and call/fax these results to patient's doctors. ______ 

## 2011-11-24 ENCOUNTER — Telehealth: Payer: Self-pay | Admitting: Medical Oncology

## 2011-11-24 NOTE — Telephone Encounter (Signed)
I called pt with chest xray results. 

## 2011-11-25 DIAGNOSIS — J309 Allergic rhinitis, unspecified: Secondary | ICD-10-CM | POA: Diagnosis not present

## 2011-12-02 DIAGNOSIS — J309 Allergic rhinitis, unspecified: Secondary | ICD-10-CM | POA: Diagnosis not present

## 2011-12-04 DIAGNOSIS — Z23 Encounter for immunization: Secondary | ICD-10-CM | POA: Diagnosis not present

## 2011-12-09 DIAGNOSIS — J309 Allergic rhinitis, unspecified: Secondary | ICD-10-CM | POA: Diagnosis not present

## 2011-12-16 DIAGNOSIS — J309 Allergic rhinitis, unspecified: Secondary | ICD-10-CM | POA: Diagnosis not present

## 2011-12-23 DIAGNOSIS — J309 Allergic rhinitis, unspecified: Secondary | ICD-10-CM | POA: Diagnosis not present

## 2011-12-30 DIAGNOSIS — J309 Allergic rhinitis, unspecified: Secondary | ICD-10-CM | POA: Diagnosis not present

## 2012-01-13 DIAGNOSIS — J309 Allergic rhinitis, unspecified: Secondary | ICD-10-CM | POA: Diagnosis not present

## 2012-01-15 ENCOUNTER — Encounter: Payer: Self-pay | Admitting: Oncology

## 2012-01-15 ENCOUNTER — Other Ambulatory Visit (HOSPITAL_BASED_OUTPATIENT_CLINIC_OR_DEPARTMENT_OTHER): Payer: Medicare Other | Admitting: Lab

## 2012-01-15 ENCOUNTER — Telehealth: Payer: Self-pay | Admitting: Oncology

## 2012-01-15 ENCOUNTER — Ambulatory Visit (HOSPITAL_BASED_OUTPATIENT_CLINIC_OR_DEPARTMENT_OTHER): Payer: Medicare Other | Admitting: Oncology

## 2012-01-15 VITALS — BP 146/80 | HR 79 | Temp 97.6°F | Resp 20 | Ht 66.0 in | Wt 239.7 lb

## 2012-01-15 DIAGNOSIS — C8589 Other specified types of non-Hodgkin lymphoma, extranodal and solid organ sites: Secondary | ICD-10-CM | POA: Diagnosis not present

## 2012-01-15 DIAGNOSIS — C184 Malignant neoplasm of transverse colon: Secondary | ICD-10-CM | POA: Diagnosis not present

## 2012-01-15 DIAGNOSIS — C8587 Other specified types of non-Hodgkin lymphoma, spleen: Secondary | ICD-10-CM

## 2012-01-15 LAB — CBC WITH DIFFERENTIAL/PLATELET
Basophils Absolute: 0.1 10*3/uL (ref 0.0–0.1)
Eosinophils Absolute: 0.2 10*3/uL (ref 0.0–0.5)
HCT: 39.9 % (ref 34.8–46.6)
HGB: 13.3 g/dL (ref 11.6–15.9)
MCV: 94.3 fL (ref 79.5–101.0)
MONO%: 18.5 % — ABNORMAL HIGH (ref 0.0–14.0)
NEUT#: 2.7 10*3/uL (ref 1.5–6.5)
NEUT%: 40.1 % (ref 38.4–76.8)
Platelets: 237 10*3/uL (ref 145–400)
RDW: 14.1 % (ref 11.2–14.5)

## 2012-01-15 LAB — LACTATE DEHYDROGENASE (CC13): LDH: 162 U/L (ref 125–245)

## 2012-01-15 LAB — COMPREHENSIVE METABOLIC PANEL (CC13)
ALT: 20 U/L (ref 0–55)
Alkaline Phosphatase: 100 U/L (ref 40–150)
CO2: 28 mEq/L (ref 22–29)
Potassium: 4.5 mEq/L (ref 3.5–5.1)
Sodium: 141 mEq/L (ref 136–145)
Total Bilirubin: 0.43 mg/dL (ref 0.20–1.20)
Total Protein: 6.3 g/dL — ABNORMAL LOW (ref 6.4–8.3)

## 2012-01-15 NOTE — Progress Notes (Signed)
This office note has been dictated.  #[id number]

## 2012-01-15 NOTE — Telephone Encounter (Signed)
gv and printed appt schedule for pt for June 2014...the patient aware that central scheduling will contact her with d/t of ct appt...gv pt Barium

## 2012-01-16 ENCOUNTER — Other Ambulatory Visit: Payer: Self-pay | Admitting: Oncology

## 2012-01-16 DIAGNOSIS — Z862 Personal history of diseases of the blood and blood-forming organs and certain disorders involving the immune mechanism: Secondary | ICD-10-CM

## 2012-01-16 LAB — IRON AND TIBC
%SAT: 27 % (ref 20–55)
Iron: 92 ug/dL (ref 42–145)

## 2012-01-16 NOTE — Progress Notes (Signed)
CC:   Marne A. Milinda Antis, MD Angelia Mould. Derrell Lolling, M.D. Barbette Hair. Arlyce Dice, MD,FACG Bud Face, MD  PROBLEM LIST:  1. 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 Both tumors were  associated with negative lymph nodes. One tumor was T2 N0. The  other tumor was T3 N0. The T2 N0 stage I tumor was in the sigmoid  colon. The T3 N0 stage II tumor was in the transverse colon. The  smaller tumor measured 1.5 cm. The larger tumor measured 6.5 cm.  Surgery was carried out on 09/28/2009. 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  2. 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  The patient had bone marrow involvement and splenomegaly at that  time, also a hemolytic anemia. She underwent a splenectomy on  November 12, 1994. Through the years, the patient has received  Rituxan most recently, dating back to May 2006. The patient  received fludarabine in late 2002 and early 2003. She may have  developed a pneumonitis from the fludarabine in December 2002. The  patient has had involvement of the peripheral blood with villous  lymphocytes. She appears to be in a state of complete remission at  this time. Tumor is CD5 and CD20 positive, also CD11c and FMC7  positive.  3. 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  4. History of lupus anticoagulant dating back to the 1990s.  5. History of iron deficiency anemia for which the patient received IV  Feraheme in January 2012. Presumably, this occurred as a result of  her colon cancer.  6. History of GERD resulting in cough diagnosed around 2010.  7. Dyslipidemia.  8. Hypothyroidism.  9. Irritable bowel syndrome.  10.She has a goiter seen on PET scan from 09/04/2009.  11.Enlarged left tonsil diagnosed April 2011, currently resolved.  12.History  of depression.    MEDICATIONS:  1. Calcium carbonate and vitamin D 600/400 one tablet daily.  2. Celebrex 200 mg daily as needed.  3. Cholecalciferol 1000 units daily.  4. Estrace 2 mg twice daily.  5. Allegra 180 mg daily.  6. Prozac 20 mg twice daily.  7. Flonase nasal spray twice daily.  8. Levothyroxine 112 mcg daily.  9. Ativan 0.5 to 1 mg daily as needed.  10.Omega-3 350 mg daily.  11.Prilosec 20 mg twice daily.  12.Zocor 40 mg daily.  13.Vitamin E 400 units by mouth daily.    Pneumovax was most recently given on 10/08/2009. Previously the patient had received Pneumovax on 07/08/2006 and on 02/11/2000.  No further vaccinations with Pneumovax are needed.  Flu shot was given in October 2013.    SMOKING HISTORY:  The patient has never smoked cigarettes.   HISTORY:  I saw Tiandra Swoveland today for followup of her synchronous colon cancers dating back to August 2011 as well as the patient's history of marginal zone lymphoma involving the bone marrow and peripheral blood dating back to 1994.  Akyia is here today with her husband, Ray.  Kenneth was last seen by [place] on 09/12/2011 and prior to that on 05/06/2011.  There has been no significant change in her condition. She continues to give out easily but denies any changes.  She has had no intercurrent health problems.  She is without any complaints today.  PHYSICAL  EXAMINATION:  There was no obvious change.  Weight was 239.7 pounds, height 5 feet 6 inches, body surface area 2.25 m2.  Blood pressure 146/80.  Other vital signs are normal.  There was no scleral icterus.  Mouth and pharynx are benign.  I did not see any obvious pathology involving the tonsillar area.  No peripheral adenopathy palpable.  Heart and lungs are normal.  Breasts are not examined. Abdomen was obese, nontender with no organomegaly or masses palpable. Extremities puffy with elastic stockings but no actual pitting edema. No clubbing.  No axillary or  inguinal adenopathy.  Neurologic exam was grossly normal.  LABORATORY DATA:  Today, white count 6.6, ANC 2.7, hemoglobin 13.3, hematocrit 39.9, platelets 237,000.  Chemistries were essentially normal except for a glucose of 100 and a total protein of 6.3, which was slightly low.  BUN was 17.0, creatinine 0.8.  CEA today is pending as are iron studies.  CEA from 09/12/2011 was 1.2.  Ferritin on 05/06/2011 was 150 as compared with 166 on 12/09/2010 and 168 on 08/26/2010.  IMAGING STUDIES:  1. Digital screening mammogram from 08/05/2010 was  negative.  2. Chest x-ray, 2 view, from 12/09/2010 was negative. 3. Digital screening mammogram on 08/20/2011 showed no specific evidence of malignancy 4. Chest x-ray, 2 view, from 11/19/2011 showed atherosclerotic calcifications within the arch of the aorta.  There was evidence of a prior cholecystectomy and splenectomy, otherwise chest x-ray was negative.  PROCEDURES:  Colonoscopy was carried out by Dr. Melvia Heaps on 10/10/2010.  IMPRESSION AND PLAN:  Gini continues to do well with no evidence for recurrent colon cancer.  The patient's has marginal zone lymphoma appears to be quiescent.  She has not required any therapy dating back, I believe, to May 2006 at which time she received Rituxan.  In looking back over Evelene's chart she did have a CT scan of abdomen and pelvis on 08/27/2009.  At that time there were some enlarged upper abdominal lymph nodes that were felt to be new compared with the CT scan of 09/02/2005. In addition, the patient did have a mass seen within the transverse colon that ultimately was resected and was the patient's colon cancer. That tumor measured 6.5 cm.  It will be recalled that 1 of the lymph nodes removed at the time of surgery had B-cell non-Hodgkin's lymphoma, thus the patient's lymphoma is present but apparently stable.  We will go ahead and obtain another CT scan of abdomen and pelvis with IV contrast to  check on both of these malignancies.  We will plan to see Dasani again in 6 months at which time we will check CBC, chemistries and CEA.  Jerica can see Norina Buzzard at that time.    ______________________________ Samul Dada, M.D. DSM/MEDQ  D:  01/15/2012  T:  01/16/2012  Job:  [id number]

## 2012-01-20 DIAGNOSIS — J309 Allergic rhinitis, unspecified: Secondary | ICD-10-CM | POA: Diagnosis not present

## 2012-01-21 ENCOUNTER — Ambulatory Visit (HOSPITAL_COMMUNITY)
Admission: RE | Admit: 2012-01-21 | Discharge: 2012-01-21 | Disposition: A | Discharge status: Home / Self Care | Payer: Medicare Other | Source: Ambulatory Visit | Attending: Oncology | Admitting: Oncology

## 2012-01-21 DIAGNOSIS — C8587 Other specified types of non-Hodgkin lymphoma, spleen: Secondary | ICD-10-CM | POA: Insufficient documentation

## 2012-01-21 DIAGNOSIS — R599 Enlarged lymph nodes, unspecified: Secondary | ICD-10-CM | POA: Diagnosis not present

## 2012-01-21 DIAGNOSIS — C184 Malignant neoplasm of transverse colon: Secondary | ICD-10-CM | POA: Insufficient documentation

## 2012-01-21 DIAGNOSIS — Z85038 Personal history of other malignant neoplasm of large intestine: Secondary | ICD-10-CM | POA: Diagnosis not present

## 2012-01-21 MED ORDER — IOHEXOL 300 MG/ML  SOLN
100.0000 mL | Freq: Once | INTRAMUSCULAR | Status: AC | PRN
  Administered 2012-01-21: 100 mL via INTRAVENOUS

## 2012-01-23 ENCOUNTER — Encounter: Payer: Self-pay | Admitting: Oncology

## 2012-01-23 NOTE — Progress Notes (Signed)
CT scan of the abdomen and pelvis with IV contrast carried out on 01/21/2012 was reviewed. There was no significant adenopathy and no evidence for recurrent colon cancer. A retrocrural lymph node measured 8 mm and had previously measured 5 mm on a PET scan from 09/04/2009.  A periportal lymph node measured 13 mm and had decreased from 23 mm on the prior PET scan from 09/04/2009.

## 2012-01-27 DIAGNOSIS — J309 Allergic rhinitis, unspecified: Secondary | ICD-10-CM | POA: Diagnosis not present

## 2012-02-06 DIAGNOSIS — J309 Allergic rhinitis, unspecified: Secondary | ICD-10-CM | POA: Diagnosis not present

## 2012-02-10 DIAGNOSIS — J309 Allergic rhinitis, unspecified: Secondary | ICD-10-CM | POA: Diagnosis not present

## 2012-02-17 DIAGNOSIS — J309 Allergic rhinitis, unspecified: Secondary | ICD-10-CM | POA: Diagnosis not present

## 2012-02-24 DIAGNOSIS — J309 Allergic rhinitis, unspecified: Secondary | ICD-10-CM | POA: Diagnosis not present

## 2012-03-02 DIAGNOSIS — J309 Allergic rhinitis, unspecified: Secondary | ICD-10-CM | POA: Diagnosis not present

## 2012-03-09 DIAGNOSIS — J309 Allergic rhinitis, unspecified: Secondary | ICD-10-CM | POA: Diagnosis not present

## 2012-03-16 DIAGNOSIS — J309 Allergic rhinitis, unspecified: Secondary | ICD-10-CM | POA: Diagnosis not present

## 2012-03-23 DIAGNOSIS — J309 Allergic rhinitis, unspecified: Secondary | ICD-10-CM | POA: Diagnosis not present

## 2012-03-30 DIAGNOSIS — J309 Allergic rhinitis, unspecified: Secondary | ICD-10-CM | POA: Diagnosis not present

## 2012-04-15 ENCOUNTER — Telehealth: Payer: Self-pay | Admitting: Oncology

## 2012-04-15 NOTE — Telephone Encounter (Signed)
lm w/ husband appt d/t was given.Marland Kitchentd

## 2012-04-20 DIAGNOSIS — J309 Allergic rhinitis, unspecified: Secondary | ICD-10-CM | POA: Diagnosis not present

## 2012-04-27 DIAGNOSIS — J309 Allergic rhinitis, unspecified: Secondary | ICD-10-CM | POA: Diagnosis not present

## 2012-05-17 ENCOUNTER — Ambulatory Visit (INDEPENDENT_AMBULATORY_CARE_PROVIDER_SITE_OTHER): Payer: Medicare Other | Admitting: Family Medicine

## 2012-05-17 ENCOUNTER — Encounter: Payer: Self-pay | Admitting: Family Medicine

## 2012-05-17 VITALS — BP 130/78 | HR 77 | Temp 98.4°F | Ht 66.0 in | Wt 242.5 lb

## 2012-05-17 DIAGNOSIS — M79609 Pain in unspecified limb: Secondary | ICD-10-CM

## 2012-05-17 DIAGNOSIS — L989 Disorder of the skin and subcutaneous tissue, unspecified: Secondary | ICD-10-CM | POA: Insufficient documentation

## 2012-05-17 DIAGNOSIS — J019 Acute sinusitis, unspecified: Secondary | ICD-10-CM

## 2012-05-17 DIAGNOSIS — B9689 Other specified bacterial agents as the cause of diseases classified elsewhere: Secondary | ICD-10-CM

## 2012-05-17 DIAGNOSIS — M79644 Pain in right finger(s): Secondary | ICD-10-CM | POA: Insufficient documentation

## 2012-05-17 MED ORDER — AZITHROMYCIN 250 MG PO TABS: ORAL_TABLET | ORAL | Status: DC

## 2012-05-17 NOTE — Progress Notes (Signed)
Subjective:    Patient ID: Diamond Boyd, female    DOB: [birth year], 72 y.o.   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 MALIGNANT NEOPLASM OF TRANSVERSE COLON  . LYMPHOMA NEC, MLIG, SPLEEN  . HYPERCHOLESTEROLEMIA, PURE  . DISORDERS OF PHOSPHORUS METABOLISM  . Anxiety state, unspecified  . DEPRESSION  . PERIPHERAL VASCULAR DISEASE  . Other chronic sinusitis  . ALLERGIC  RHINITIS  . GERD  . OVERACTIVE BLADDER  . MENOPAUSAL SYNDROME  . OSTEOARTHRITIS  . LEG EDEMA  . THROAT PAIN, CHRONIC  . SKIN CANCER, HX OF  . COLONIC POLYPS, HX OF  . Elevated blood pressure  . Obesity  . Hypothyroid  . Varicose veins  . Other asplenic status  . History of anemia  . Screening for lipoid disorders  . Hyperlipidemia, mild  . Other screening mammogram  . Routine gynecological examination  . Colon cancer screening   Past Medical History  Diagnosis Date  . Colon polyps   . Depression   . Hypothyroidism   . Osteoarthritis     hands  . Peripheral vascular disease   . Degenerative disk disease     spine in center in past   . Cancer     skin cancer- basal cell on arm / colon 2011  . Lymphoma   . Colon cancer   . 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 Cholecystectomy    . Splenectomy      lymphoma  . Breast biopsy  1996  . Plantar fascia release    . Ventral  hernia repair  1998  . Tendon release      Right thumb   . Colectomy  8/11  . Vaginal hysterectomy     History  Substance Use Topics  . Smoking status: Never Smoker   . Smokeless tobacco: Not on file  . Alcohol Use: No   Family History  Problem Relation Age of Onset  . Coronary artery disease Mother   . Alcohol abuse Mother   . Cancer Father     jaw  . Diabetes Brother   . Fibromyalgia Daughter     chronic pain   . COPD Daughter   . Colon cancer Neg Hx   . Colon polyps Neg Hx   . Stomach cancer Neg Hx    Allergies  Allergen Reactions  . Achromycin (Tetracycline Hcl)     Pt does not remember reaction  . Allopurinol     REACTION: Unsure of reaction happene years ago  . Cephalexin     REACTION: unsure of reaction happened yrs ago.  . Ciprofloxacin     REACTION: rash  . Codeine     REACTION: abd. pain  . Meloxicam  REACTION: GI symptoms  . Minocycline     Abdominal pain  . Nabumetone     REACTION: reaction not known  . Nyquil (Pseudoeph-Doxylamine-Dm-Apap) Hives  . Penicillins     REACTION: reaction not known  . Sulfonamide Derivatives     Abdominal pain  . Zolpidem Tartrate     REACTION: feels too drugged  . Buspar (Buspirone Hcl)     Dizziness, and not as effective for anxiety   Current Outpatient Prescriptions on File Prior to Visit  Medication Sig Dispense Refill  . Calcium Carbonate-Vitamin D (CALCIUM 600+D) 600-400 MG-UNIT per tablet Take 1 tablet by mouth daily.        . celecoxib (CELEBREX) 200 MG capsule Take 1 capsule (200 mg total) by mouth daily as needed for pain (with food).  90 capsule  3  . cholecalciferol (VITAMIN D) 1000 UNITS tablet Take 1,000 Units by mouth daily.        Marland Kitchen estradiol (ESTRACE) 2 MG tablet Take 1 tablet (2 mg total) by mouth 2 (two) times daily.  180 tablet  3  . fexofenadine (ALLEGRA) 180 MG tablet Take 1 tablet (180 mg total) by mouth daily.  90 tablet  3  . FLUoxetine (PROZAC) 20 MG capsule Take 2 capsules (40 mg total)  by mouth 2 (two) times daily.  180 capsule  3  . fluticasone (FLONASE) 50 MCG/ACT nasal spray Place 2 sprays into the nose daily. 1 spray in each nostril twice daily  48 g  3  . levothyroxine (SYNTHROID, LEVOTHROID) 112 MCG tablet Take 1 tablet (112 mcg total) by mouth daily.  90 tablet  3  . LORazepam (ATIVAN) 1 MG tablet 1/2 to 1 by mouth once daily as needed severe anxiety watch out of sedation      . Meth-Hyo-M Bl-Benz Acd-Ph Sal (PROSED/DS, ATROPINE FREE,) 81.6 MG TABS Take 1 tablet (81.6 mg total) by mouth 4 (four) times daily.  360 tablet  3  . Omega-3 350 MG CAPS Take by mouth. Take once capsule by mouth once a day       . omeprazole (PRILOSEC) 20 MG capsule Take 1 capsule (20 mg total) by mouth 2 (two) times daily. Take one 30-60 min before first and last meals of the day as long as coughing at all, then once daily before breakfast  180 capsule  3  . simvastatin (ZOCOR) 40 MG tablet Take 1 tablet (40 mg total) by mouth daily.  90 tablet  3  . vitamin E (VITAMIN E) 400 UNIT capsule Take 400 Units by mouth daily.         Current Facility-Administered Medications on File Prior to Visit  Medication Dose Route Frequency Provider Last Rate Last Dose  . 0.9 %  sodium chloride infusion  500 mL Intravenous Continuous Louis Meckel, MD         Review of Systems Review of Systems  Constitutional: Negative for fever, appetite change, and unexpected weight change.  ENT pos for congestion/ rhinorrhea/ facial pain and purulent drainage  Eyes: Negative for pain and visual disturbance.  Respiratory: Negative for wheeze  and shortness of breath.   Cardiovascular: Negative for cp or palpitations    Gastrointestinal: Negative for nausea, diarrhea and constipation.  Genitourinary: Negative for urgency and frequency.  Skin: Negative for pallor or rash  pos for lesion on nose  MSK pos for OA in hands , stiffness in R 5th finger Neurological: Negative for weakness, light-headedness, numbness and  headaches.  Hematological:  Negative for adenopathy. Does not bruise/bleed easily.  Psychiatric/Behavioral: Negative for dysphoric mood. The patient is not nervous/anxious.         Objective:   Physical Exam  Constitutional: She appears well-developed and well-nourished. No distress.  obese and well appearing   HENT:  Head: Normocephalic and atraumatic.  Right Ear: External ear normal.  Left Ear: External ear normal.  Mouth/Throat: Oropharynx is clear and moist. No oropharyngeal exudate.  Nares are injected and congested   Bilateral maxillary sinus tenderness  Eyes: Conjunctivae and EOM are normal. Pupils are equal, round, and reactive to light. Right eye exhibits no discharge. Left eye exhibits no discharge. No scleral icterus.  Neck: Normal range of motion. Neck supple. No JVD present. Carotid bruit is not present. No thyromegaly present.  Cardiovascular: Normal rate, regular rhythm and normal heart sounds.  Exam reveals no gallop.   Pulmonary/Chest: Effort normal and breath sounds normal. No respiratory distress. She has no wheezes. She has no rales.  Abdominal: Soft. Bowel sounds are normal. She exhibits no abdominal bruit.  Musculoskeletal: She exhibits edema and tenderness.  Right 5th finger - joint swelling of middle PIP -with mild tenderness, but nl rom - no triggering or crepitus  No redness or warmth  Lymphadenopathy:    She has no cervical adenopathy.  Neurological: She is alert. She has normal reflexes.  Skin: Skin is warm and dry. No rash noted. No erythema. No pallor.  2-3 mm pale papule on bridge of nose   Psychiatric: She has a normal mood and affect.          Assessment & Plan:

## 2012-05-17 NOTE — Assessment & Plan Note (Signed)
In pt with frequent problems and chronic congestion and prior lymphoma  Px zpak- this is one of the only thinks she is tolerates and it usually works well  Adv to continue allergy regimen as well as saline washes Update if not starting to improve in a week or if worsening

## 2012-05-17 NOTE — Assessment & Plan Note (Signed)
With stiffness and hx of rep trauma and stiffness (no triggering) Suspect OA of PIP Recommend keeping it warm- and use of tylenol if needed Will continue to obs

## 2012-05-17 NOTE — Patient Instructions (Addendum)
Take the zithromax for a sinus infection Use your saline nasal washes Update if not starting to improve in a week or if worsening   I think you are getting arthritis in finger-keep it warm - especially when it is stiff and keep me updated We will refer you to dermatology at check out  Make a follow up appt (30 min) for memory problems- write down as much as you can regarding your symptoms

## 2012-05-17 NOTE — Assessment & Plan Note (Signed)
Pale papule on nasal bridge that is larger Ref to derm for eval

## 2012-05-25 DIAGNOSIS — J309 Allergic rhinitis, unspecified: Secondary | ICD-10-CM | POA: Diagnosis not present

## 2012-06-01 ENCOUNTER — Ambulatory Visit (INDEPENDENT_AMBULATORY_CARE_PROVIDER_SITE_OTHER): Payer: Medicare Other | Admitting: Family Medicine

## 2012-06-01 ENCOUNTER — Encounter: Payer: Self-pay | Admitting: Family Medicine

## 2012-06-01 ENCOUNTER — Ambulatory Visit (INDEPENDENT_AMBULATORY_CARE_PROVIDER_SITE_OTHER)
Admission: RE | Admit: 2012-06-01 | Discharge: 2012-06-01 | Disposition: A | Discharge status: Home / Self Care | Payer: Medicare Other | Source: Ambulatory Visit | Attending: Family Medicine | Admitting: Family Medicine

## 2012-06-01 VITALS — BP 142/68 | HR 89 | Temp 97.9°F | Ht 66.0 in | Wt 246.5 lb

## 2012-06-01 DIAGNOSIS — R071 Chest pain on breathing: Secondary | ICD-10-CM

## 2012-06-01 DIAGNOSIS — R079 Chest pain, unspecified: Secondary | ICD-10-CM | POA: Diagnosis not present

## 2012-06-01 DIAGNOSIS — Z85038 Personal history of other malignant neoplasm of large intestine: Secondary | ICD-10-CM | POA: Diagnosis not present

## 2012-06-01 DIAGNOSIS — R0789 Other chest pain: Secondary | ICD-10-CM

## 2012-06-01 DIAGNOSIS — M546 Pain in thoracic spine: Secondary | ICD-10-CM

## 2012-06-01 DIAGNOSIS — R413 Other amnesia: Secondary | ICD-10-CM

## 2012-06-01 MED ORDER — CYCLOBENZAPRINE HCL 10 MG PO TABS: 10.0000 mg | ORAL_TABLET | Freq: Three times a day (TID) | ORAL | Status: DC | PRN

## 2012-06-01 NOTE — Progress Notes (Signed)
Subjective:    Patient ID: Diamond Boyd, female    DOB: Mar 21, 1940, 72 y.o.   MRN: [id number]  HPI Here for back and shoulder blade problem   Last week it started in her low back - had lifted a clothes basket  Then pain started moving up her back  Several days has moved into her shoulder blade L side ( a little sore- not too bad-has had bursitis in the past) The pain is dull / achey , when she takes a deep breath is much more severe   Worse to tilt head to her right  Not a lot of pain with arm movement  Does not get enough exercise  Pain does not radiate   No fever  Coughs just a little - baseline  Taking some tylenol-no help  No ice or heat   Was originally here for memory loss for 2 months Is forgetting people's names occ misplaces things  occ walks into a room and forgets why she is there  Only short term memory  Husband notices her forgetting things during a TV program - then gets agitated over issues Has not missed appts - but sometimes forgets / uses a calender Tries to stay organized No learning problems as a child -good memory  Concentration is ok (per pt)- husband disputes this a bit  Has not become lost (husband says she looks for him often) Has been irritable lately - does not know why -easily set off   Stressors-has to take care of her daughter who lives with them  Has an emotional disorder  Grandson she raised is moving on   Patient Active Problem List  Diagnosis  . MALIGNANT NEOPLASM OF TRANSVERSE COLON  . LYMPHOMA NEC, MLIG, SPLEEN  . HYPERCHOLESTEROLEMIA, PURE  . DISORDERS OF PHOSPHORUS METABOLISM  . Anxiety state, unspecified  . DEPRESSION  . PERIPHERAL VASCULAR DISEASE  . Other chronic sinusitis  . ALLERGIC  RHINITIS  . GERD  . OVERACTIVE BLADDER  . MENOPAUSAL SYNDROME  . OSTEOARTHRITIS  . LEG EDEMA  . THROAT PAIN, CHRONIC  . SKIN CANCER, HX OF  . COLONIC POLYPS, HX OF  . Elevated blood pressure  . Obesity  . Hypothyroid  . Varicose  veins  . Other asplenic status  . History of anemia  . Screening for lipoid disorders  . Hyperlipidemia, mild  . Other screening mammogram  . Routine gynecological examination  . Colon cancer screening  . Pain in finger of right hand  . Skin lesion of face  . Thoracic back pain  . Left-sided chest wall pain   Past Medical History  Diagnosis Date  . Colon polyps   . Depression   . Hypothyroidism   . Osteoarthritis     hands  . Peripheral vascular disease   . Degenerative disk disease     spine in center in past   . Cancer     skin cancer- basal cell on arm / colon 2011  . Lymphoma   . Colon cancer   . 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 Cholecystectomy    . Splenectomy      lymphoma  . Breast biopsy  1996  . Plantar fascia release    . Ventral hernia repair  1998  . Tendon release      Right thumb   . Colectomy  8/11  . Vaginal hysterectomy     History  Substance Use Topics  .  Smoking status: Never Smoker   . Smokeless tobacco: Not on file  . Alcohol Use: No   Family History  Problem Relation Age of Onset  . Coronary artery disease Mother   . Alcohol abuse Mother   . Cancer Father     jaw  . Diabetes Brother   . Fibromyalgia Daughter     chronic pain   . COPD Daughter   . Colon cancer Neg Hx   . Colon polyps Neg Hx   . Stomach cancer Neg Hx    Allergies  Allergen Reactions  . Achromycin (Tetracycline Hcl)     Pt does not remember reaction  . Allopurinol     REACTION: Unsure of reaction happene years ago  . Cephalexin     REACTION: unsure of reaction happened yrs ago.  . Ciprofloxacin     REACTION: rash  . Codeine     REACTION: abd. pain  . Meloxicam     REACTION: GI symptoms  . Minocycline     Abdominal pain  . Nabumetone     REACTION: reaction not known  . Nyquil (Pseudoeph-Doxylamine-Dm-Apap) Hives  . Penicillins     REACTION: reaction not known  . Sulfonamide Derivatives      Abdominal pain  . Zolpidem Tartrate     REACTION: feels too drugged  . Buspar (Buspirone Hcl)     Dizziness, and not as effective for anxiety   Current Outpatient Prescriptions on File Prior to Visit  Medication Sig Dispense Refill  . Calcium Carbonate-Vitamin D (CALCIUM 600+D) 600-400 MG-UNIT per tablet Take 1 tablet by mouth daily.        . celecoxib (CELEBREX) 200 MG capsule Take 1 capsule (200 mg total) by mouth daily as needed for pain (with food).  90 capsule  3  . cholecalciferol (VITAMIN D) 1000 UNITS tablet Take 1,000 Units by mouth daily.        Marland Kitchen estradiol (ESTRACE) 2 MG tablet Take 1 tablet (2 mg total) by mouth 2 (two) times daily.  180 tablet  3  . fexofenadine (ALLEGRA) 180 MG tablet Take 1 tablet (180 mg total) by mouth daily.  90 tablet  3  . FLUoxetine (PROZAC) 20 MG capsule Take 2 capsules (40 mg total) by mouth 2 (two) times daily.  180 capsule  3  . fluticasone (FLONASE) 50 MCG/ACT nasal spray Place 2 sprays into the nose daily. 1 spray in each nostril twice daily  48 g  3  . levothyroxine (SYNTHROID, LEVOTHROID) 112 MCG tablet Take 1 tablet (112 mcg total) by mouth daily.  90 tablet  3  . LORazepam (ATIVAN) 1 MG tablet 1/2 to 1 by mouth once daily as needed severe anxiety watch out of sedation      . Meth-Hyo-M Bl-Benz Acd-Ph Sal (PROSED/DS, ATROPINE FREE,) 81.6 MG TABS Take 1 tablet (81.6 mg total) by mouth 4 (four) times daily.  360 tablet  3  . Omega-3 350 MG CAPS Take by mouth. Take once capsule by mouth once a day       . omeprazole (PRILOSEC) 20 MG capsule Take 1 capsule (20 mg total) by mouth 2 (two) times daily. Take one 30-60 min before first and last meals of the day as long as coughing at all, then once daily before breakfast  180 capsule  3  . simvastatin (ZOCOR) 40 MG tablet Take 1 tablet (40 mg total) by mouth daily.  90 tablet  3  . vitamin E (VITAMIN E) 400 UNIT capsule  Take 400 Units by mouth daily.         Current Facility-Administered Medications on  File Prior to Visit  Medication Dose Route Frequency Provider Last Rate Last Dose  . 0.9 %  sodium chloride infusion  500 mL Intravenous Continuous Louis Meckel, MD         Review of Systems Review of Systems  Constitutional: Negative for fever, appetite change, fatigue and unexpected weight change.  Eyes: Negative for pain and visual disturbance.  Respiratory: Negative for cough and shortness of breath.   Cardiovascular: Negative for cp or palpitations    Gastrointestinal: Negative for nausea, diarrhea and constipation.  Genitourinary: Negative for urgency and frequency.  Skin: Negative for pallor or rash   MSK pos for L sided thoracic back pain around scapula and shoulder pain/ neg for joint swelling Neurological: Negative for weakness, light-headedness, numbness and headaches.  Hematological: Negative for adenopathy. Does not bruise/bleed easily.  Psychiatric/Behavioral: Negative for dysphoric mood. Pos for anxiety and also irritability Pos for short term memory loss without confusion       Objective:   Physical Exam  Constitutional: She appears well-developed and well-nourished. No distress.  HENT:  Head: Normocephalic and atraumatic.  Mouth/Throat: Oropharynx is clear and moist.  Eyes: Conjunctivae and EOM are normal. Pupils are equal, round, and reactive to light. No scleral icterus.  Neck: Normal range of motion. Neck supple. No JVD present. Carotid bruit is not present. No thyromegaly present.  Musculoskeletal: She exhibits tenderness. She exhibits no edema.  TS tenderness -worse over L sided musculature medial to scapula  Nl rom TS Limited abd of R shoulder as well as internal rotation No crepitus or swelling No acromion tenderness Pain on tilting head to the right   Lymphadenopathy:    She has no cervical adenopathy.  Neurological: She is alert. She has normal strength and normal reflexes. She displays no atrophy and no tremor. No cranial nerve deficit or sensory  deficit. She exhibits normal muscle tone. Coordination normal.  Skin: Skin is warm and dry. No rash noted. No erythema. No pallor.  Psychiatric: Thought content normal. Her mood appears anxious. Her affect is labile. Her speech is not rapid and/or pressured, not delayed and not tangential. She is not slowed and not withdrawn. Thought content is not paranoid. She exhibits a depressed mood. She expresses no homicidal and no suicidal ideation. She exhibits abnormal recent memory.  Argumentative with husband today  Answers questions appropriately She is attentive.          Assessment & Plan:

## 2012-06-01 NOTE — Assessment & Plan Note (Signed)
Hurts to take a deep breath -in shoulderblade area Suspect rel to strain In light of cancer history- will check cxr

## 2012-06-01 NOTE — Patient Instructions (Addendum)
Try the flexeril for back pain- watch out for sedation  Also heat 10 minutes at a time and stretches  Also walking  Chest xray today and we will call you with a result Follow up in 2-3 weeks to discuss memory loss in more detail

## 2012-06-01 NOTE — Assessment & Plan Note (Signed)
Suspect anxiety/ mood issues/stressors - produce irritability that is confounding (pt skeptical of counseling) Will f/u for MMS exam and further discussion

## 2012-06-01 NOTE — Assessment & Plan Note (Signed)
Pain around shoulder blade - suspect trapezius / rhomboid spasm Heat/ flexeril recommended with caution Taught stretches cxr today in light of former cancer and also pain with deep breath (no sob) F/u 2-3 wk

## 2012-06-03 DIAGNOSIS — J309 Allergic rhinitis, unspecified: Secondary | ICD-10-CM | POA: Diagnosis not present

## 2012-06-08 DIAGNOSIS — D1800 Hemangioma unspecified site: Secondary | ICD-10-CM | POA: Diagnosis not present

## 2012-06-08 DIAGNOSIS — L820 Inflamed seborrheic keratosis: Secondary | ICD-10-CM | POA: Diagnosis not present

## 2012-06-08 DIAGNOSIS — L739 Follicular disorder, unspecified: Secondary | ICD-10-CM | POA: Diagnosis not present

## 2012-06-08 DIAGNOSIS — D485 Neoplasm of uncertain behavior of skin: Secondary | ICD-10-CM | POA: Diagnosis not present

## 2012-06-08 DIAGNOSIS — L821 Other seborrheic keratosis: Secondary | ICD-10-CM | POA: Diagnosis not present

## 2012-06-15 DIAGNOSIS — J309 Allergic rhinitis, unspecified: Secondary | ICD-10-CM | POA: Diagnosis not present

## 2012-06-22 DIAGNOSIS — J309 Allergic rhinitis, unspecified: Secondary | ICD-10-CM | POA: Diagnosis not present

## 2012-06-25 ENCOUNTER — Ambulatory Visit (INDEPENDENT_AMBULATORY_CARE_PROVIDER_SITE_OTHER): Payer: Medicare Other | Admitting: Family Medicine

## 2012-06-25 ENCOUNTER — Encounter: Payer: Self-pay | Admitting: Family Medicine

## 2012-06-25 VITALS — BP 130/62 | HR 90 | Temp 97.9°F | Ht 66.0 in | Wt 245.5 lb

## 2012-06-25 DIAGNOSIS — F411 Generalized anxiety disorder: Secondary | ICD-10-CM | POA: Diagnosis not present

## 2012-06-25 DIAGNOSIS — R413 Other amnesia: Secondary | ICD-10-CM | POA: Diagnosis not present

## 2012-06-25 NOTE — Progress Notes (Signed)
Subjective:    Patient ID: Diamond Boyd, female    DOB: 16-Dec-1940, 72 y.o.   MRN: [id number]  HPI Here for f/u of memory problems  Is with her husband   Since last visit - she forgot she filled out a card at a store twice- then got frustrated  Mood fluctuates - with irritability comes and goes  Not anxious or sad per pt - husband says she gets anxious   MMS exam today   She occ misplaces items/ forgets names (they come back to her) or walks into a room for ? Reason Does not get lost or confused/ wander/ have problems at night or leave stove or water running Has not become lost   MMS score is roughly 30/30 today  No apparent deficiencies in memory or cognition on this and also clock draw was accurate  Husband present during exam today   Lab Results  Component Value Date   TSH 1.03 07/18/2011    Lab Results  Component Value Date   VITAMINB12 392 09/14/2009    No new medicines   Stressors- her grandson  Trying not to call him as much  She worries a lot  Daughter lives with them and has a lot of problems  Is retired after working 3rd shift for 30 years  Still on that schedule   Patient Active Problem List   Diagnosis Date Noted  . Thoracic back pain 06/01/2012  . Left-sided chest wall pain 06/01/2012  . Memory loss 06/01/2012  . Pain in finger of right hand 05/17/2012  . Skin lesion of face 05/17/2012  . Other screening mammogram 07/22/2011  . Routine gynecological examination 07/22/2011  . Colon cancer screening 07/22/2011  . Screening for lipoid disorders 07/17/2011  . Hyperlipidemia, mild 07/17/2011  . History of anemia 05/06/2011  . 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 Hypothyroid 02/24/2011  . Varicose veins 02/24/2011  . Elevated blood pressure 11/20/2010  . Obesity 11/20/2010  . 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 Other chronic sinusitis 07/03/2009  . THROAT PAIN, CHRONIC 07/03/2009  .  GERD 12/20/2008  . Anxiety state, unspecified 08/22/2008  . MENOPAUSAL SYNDROME 07/13/2007  . HYPERCHOLESTEROLEMIA, PURE 07/08/2006  . ALLERGIC  RHINITIS 07/08/2006  . OVERACTIVE BLADDER 07/08/2006  . 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 DEPRESSION 06/25/2006  . PERIPHERAL VASCULAR DISEASE 06/25/2006  . OSTEOARTHRITIS 06/25/2006  . LEG EDEMA 06/25/2006  . SKIN CANCER, HX OF 06/25/2006  . COLONIC POLYPS, HX OF 06/25/2006   Past Medical History  Diagnosis Date  . Colon polyps   . Depression   . Hypothyroidism   . Osteoarthritis     hands  . Peripheral vascular disease   . Degenerative disk disease     spine in center in past   . Cancer     skin cancer- basal cell on arm / colon 2011  . Lymphoma   . Colon cancer   . 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 Cholecystectomy    . Splenectomy      lymphoma  . Breast biopsy  1996  . Plantar fascia release    . Ventral hernia repair  1998  . Tendon release      Right thumb   . Colectomy  8/11  . Vaginal hysterectomy     History  Substance Use Topics  . Smoking status: Never Smoker   . Smokeless tobacco:  Not on file  . Alcohol Use: No   Family History  Problem Relation Age of Onset  . Coronary artery disease Mother   . Alcohol abuse Mother   . Cancer Father     jaw  . Diabetes Brother   . Fibromyalgia Daughter     chronic pain   . COPD Daughter   . Colon cancer Neg Hx   . Colon polyps Neg Hx   . Stomach cancer Neg Hx    Allergies  Allergen Reactions  . Achromycin (Tetracycline Hcl)     Pt does not remember reaction  . Allopurinol     REACTION: Unsure of reaction happene years ago  . Cephalexin     REACTION: unsure of reaction happened yrs ago.  . Ciprofloxacin     REACTION: rash  . Codeine     REACTION: abd. pain  . Meloxicam     REACTION: GI symptoms  . Minocycline     Abdominal pain  . Nabumetone     REACTION: reaction not known  . Nyquil  (Pseudoeph-Doxylamine-Dm-Apap) Hives  . Penicillins     REACTION: reaction not known  . Sulfonamide Derivatives     Abdominal pain  . Zolpidem Tartrate     REACTION: feels too drugged  . Buspar (Buspirone Hcl)     Dizziness, and not as effective for anxiety   Current Outpatient Prescriptions on File Prior to Visit  Medication Sig Dispense Refill  . Calcium Carbonate-Vitamin D (CALCIUM 600+D) 600-400 MG-UNIT per tablet Take 1 tablet by mouth daily.        . celecoxib (CELEBREX) 200 MG capsule Take 1 capsule (200 mg total) by mouth daily as needed for pain (with food).  90 capsule  3  . cholecalciferol (VITAMIN D) 1000 UNITS tablet Take 1,000 Units by mouth daily.        . cyclobenzaprine (FLEXERIL) 10 MG tablet Take 1 tablet (10 mg total) by mouth 3 (three) times daily as needed for muscle spasms.  30 tablet  0  . estradiol (ESTRACE) 2 MG tablet Take 1 tablet (2 mg total) by mouth 2 (two) times daily.  180 tablet  3  . fexofenadine (ALLEGRA) 180 MG tablet Take 1 tablet (180 mg total) by mouth daily.  90 tablet  3  . FLUoxetine (PROZAC) 20 MG capsule Take 2 capsules (40 mg total) by mouth 2 (two) times daily.  180 capsule  3  . fluticasone (FLONASE) 50 MCG/ACT nasal spray Place 2 sprays into the nose daily. 1 spray in each nostril twice daily  48 g  3  . levothyroxine (SYNTHROID, LEVOTHROID) 112 MCG tablet Take 1 tablet (112 mcg total) by mouth daily.  90 tablet  3  . LORazepam (ATIVAN) 1 MG tablet 1/2 to 1 by mouth once daily as needed severe anxiety watch out of sedation      . Meth-Hyo-M Bl-Benz Acd-Ph Sal (PROSED/DS, ATROPINE FREE,) 81.6 MG TABS Take 1 tablet (81.6 mg total) by mouth 4 (four) times daily.  360 tablet  3  . Omega-3 350 MG CAPS Take by mouth. Take once capsule by mouth once a day       . omeprazole (PRILOSEC) 20 MG capsule Take 1 capsule (20 mg total) by mouth 2 (two) times daily. Take one 30-60 min before first and last meals of the day as long as coughing at all, then once  daily before breakfast  180 capsule  3  . simvastatin (ZOCOR) 40 MG tablet Take 1  tablet (40 mg total) by mouth daily.  90 tablet  3  . vitamin E (VITAMIN E) 400 UNIT capsule Take 400 Units by mouth daily.         Current Facility-Administered Medications on File Prior to Visit  Medication Dose Route Frequency Provider Last Rate Last Dose  . 0.9 %  sodium chloride infusion  500 mL Intravenous Continuous Louis Meckel, MD        Review of Systems Review of Systems  Constitutional: Negative for fever, appetite change, fatigue and unexpected weight change.  Eyes: Negative for pain and visual disturbance.  Respiratory: Negative for cough and shortness of breath.   Cardiovascular: Negative for cp or palpitations    Gastrointestinal: Negative for nausea, diarrhea and constipation.  Genitourinary: Negative for urgency and frequency.  Skin: Negative for pallor or rash   Neurological: Negative for weakness, light-headedness, numbness and headaches.  Hematological: Negative for adenopathy. Does not bruise/bleed easily.  Psychiatric/Behavioral: pos for dysphoric mood and anxiety/ worry/ stressors without SI        Objective:   Physical Exam  Constitutional: She appears well-developed and well-nourished. No distress.  obese and well appearing   HENT:  Head: Normocephalic and atraumatic.  Cardiovascular: Normal rate and regular rhythm.   Neurological: She is alert. She has normal reflexes. She displays no tremor. Coordination and gait normal.  Skin: Skin is warm and dry. No rash noted. No erythema. No pallor.  Psychiatric: Her speech is normal and behavior is normal. Thought content normal. Her mood appears anxious. Her affect is not blunt and not labile. She is not agitated, not slowed and not withdrawn. Thought content is not paranoid. Cognition and memory are normal. She does not exhibit a depressed mood. She expresses no homicidal and no suicidal ideation.  Overall - candid about her  stressors and not tearful   Supportive husband present - and she seems to be getting along better with him today          Assessment & Plan:

## 2012-06-25 NOTE — Assessment & Plan Note (Signed)
Very reassuring hx and MMS (score of 30) I do not suspect dementia but we will follow this closely over time  I think emotional issues and stressors/ lack of sleep playa role

## 2012-06-25 NOTE — Patient Instructions (Addendum)
I am glad your memory test came out well - but I want to watch you closely  Your stressors are high -if you want to see a counselor at any time- please let me know Also increasing your prozac is an option  Take care of yourself

## 2012-06-25 NOTE — Assessment & Plan Note (Signed)
Pt is having issues with fatigue/cognition/ memory problems and irritability Long disc with pt and husband about this  Disc opt of counseling-she declines now  May need to consider med titration if this worsens

## 2012-06-29 DIAGNOSIS — J309 Allergic rhinitis, unspecified: Secondary | ICD-10-CM | POA: Diagnosis not present

## 2012-06-29 DIAGNOSIS — Z79899 Other long term (current) drug therapy: Secondary | ICD-10-CM | POA: Diagnosis not present

## 2012-07-06 DIAGNOSIS — J309 Allergic rhinitis, unspecified: Secondary | ICD-10-CM | POA: Diagnosis not present

## 2012-07-07 ENCOUNTER — Telehealth: Payer: Self-pay | Admitting: Oncology

## 2012-07-07 NOTE — Telephone Encounter (Signed)
Per desk nurse 6/5 appt moved to 6/4. 6/5 slot needed for tx pt. S/w pt she is aware.

## 2012-07-13 DIAGNOSIS — J309 Allergic rhinitis, unspecified: Secondary | ICD-10-CM | POA: Diagnosis not present

## 2012-07-14 ENCOUNTER — Other Ambulatory Visit (HOSPITAL_BASED_OUTPATIENT_CLINIC_OR_DEPARTMENT_OTHER): Payer: Medicare Other | Admitting: Lab

## 2012-07-14 ENCOUNTER — Telehealth: Payer: Self-pay | Admitting: Oncology

## 2012-07-14 ENCOUNTER — Ambulatory Visit (HOSPITAL_BASED_OUTPATIENT_CLINIC_OR_DEPARTMENT_OTHER): Payer: Medicare Other | Admitting: Oncology

## 2012-07-14 ENCOUNTER — Encounter: Payer: Self-pay | Admitting: Oncology

## 2012-07-14 VITALS — BP 151/72 | HR 68 | Temp 97.4°F | Resp 18 | Ht 66.0 in | Wt 244.5 lb

## 2012-07-14 DIAGNOSIS — C187 Malignant neoplasm of sigmoid colon: Secondary | ICD-10-CM

## 2012-07-14 DIAGNOSIS — Z862 Personal history of diseases of the blood and blood-forming organs and certain disorders involving the immune mechanism: Secondary | ICD-10-CM

## 2012-07-14 DIAGNOSIS — C8587 Other specified types of non-Hodgkin lymphoma, spleen: Secondary | ICD-10-CM

## 2012-07-14 DIAGNOSIS — C184 Malignant neoplasm of transverse colon: Secondary | ICD-10-CM | POA: Diagnosis not present

## 2012-07-14 DIAGNOSIS — C8589 Other specified types of non-Hodgkin lymphoma, extranodal and solid organ sites: Secondary | ICD-10-CM | POA: Diagnosis not present

## 2012-07-14 LAB — CBC WITH DIFFERENTIAL/PLATELET
BASO%: 0.9 % (ref 0.0–2.0)
EOS%: 3.7 % (ref 0.0–7.0)
LYMPH%: 49.9 % — ABNORMAL HIGH (ref 14.0–49.7)
MCH: 30.8 pg (ref 25.1–34.0)
MCHC: 33.5 g/dL (ref 31.5–36.0)
MCV: 92.1 fL (ref 79.5–101.0)
MONO%: 21.8 % — ABNORMAL HIGH (ref 0.0–14.0)
Platelets: 229 10*3/uL (ref 145–400)
RBC: 4.4 10*6/uL (ref 3.70–5.45)

## 2012-07-14 LAB — COMPREHENSIVE METABOLIC PANEL (CC13)
Albumin: 3.8 g/dL (ref 3.5–5.0)
Alkaline Phosphatase: 94 U/L (ref 40–150)
CO2: 26 mEq/L (ref 22–29)
Glucose: 103 mg/dl — ABNORMAL HIGH (ref 70–99)
Potassium: 4 mEq/L (ref 3.5–5.1)
Sodium: 140 mEq/L (ref 136–145)
Total Protein: 6.3 g/dL — ABNORMAL LOW (ref 6.4–8.3)

## 2012-07-14 LAB — IRON AND TIBC: TIBC: 332 ug/dL (ref 250–470)

## 2012-07-14 LAB — CEA: CEA: 1.5 ng/mL (ref 0.0–5.0)

## 2012-07-14 NOTE — Telephone Encounter (Signed)
gv and printed appt sched and avs for pt  °

## 2012-07-14 NOTE — Progress Notes (Signed)
This office note has been dictated.  #[id number]

## 2012-07-15 ENCOUNTER — Other Ambulatory Visit: Payer: Medicare Other | Admitting: Lab

## 2012-07-15 ENCOUNTER — Ambulatory Visit: Payer: Medicare Other | Admitting: Family

## 2012-07-15 ENCOUNTER — Ambulatory Visit: Payer: Medicare Other | Admitting: Oncology

## 2012-07-15 NOTE — Progress Notes (Signed)
CC:   Diamond A. Milinda Antis, MD Angelia Mould. Derrell Lolling, M.D. Barbette Hair. Arlyce Dice, MD,FACG Bud Face, MD  PROBLEM LIST:  1. 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 Both tumors were  associated with negative lymph nodes. One tumor was T2 N0. The  other tumor was T3 N0. The T2 N0 stage I tumor was in the sigmoid  colon. The T3 N0 stage II tumor was in the transverse colon. The  smaller tumor measured 1.5 cm. The larger tumor measured 6.5 cm.  Surgery was carried out on 09/28/2009. 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    2. 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  The patient had bone marrow involvement and splenomegaly at that  time, also a hemolytic anemia. She underwent a splenectomy on  November 12, 1994. Through the years, the patient has received  Rituxan most recently, dating back to May 2006. The patient  received fludarabine in late 2002 and early 2003. She may have  developed a pneumonitis from the fludarabine in December 2002. The  patient has had involvement of the peripheral blood with villous  lymphocytes. She appears to be in a state of complete clinical  remission at this time. Tumor is CD5 and CD20 positive, also CD11c and FMC7  positive.   3. 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  4. History of lupus anticoagulant dating back to the 1990s.  5. History of iron deficiency anemia for which the patient received IV  Feraheme in January 2012. Presumably, this occurred as a result of  her colon cancer.  6. History of GERD resulting in cough diagnosed around 2010.  7. Dyslipidemia.  8. Hypothyroidism.  9. Irritable bowel syndrome.  10. Goiter seen on PET scan from 09/04/2009.  11. Enlarged left tonsil diagnosed April 2011, currently resolved.  12.  History of depression.    MEDICATIONS:  Reviewed and recorded. Current Outpatient Prescriptions  Medication Sig Dispense Refill  . Calcium Carbonate-Vitamin D (CALCIUM 600+D) 600-400 MG-UNIT per tablet Take 1 tablet by mouth daily.        . celecoxib (CELEBREX) 200 MG capsule Take 1 capsule (200 mg total) by mouth daily as needed for pain (with food).  90 capsule  3  . cholecalciferol (VITAMIN D) 1000 UNITS tablet Take 1,000 Units by mouth daily.        . cyclobenzaprine (FLEXERIL) 10 MG tablet Take 1 tablet (10 mg total) by mouth 3 (three) times daily as needed for muscle spasms.  30 tablet  0  . estradiol (ESTRACE) 2 MG tablet Take 1 tablet (2 mg total) by mouth 2 (two) times daily.  180 tablet  3  . fexofenadine (ALLEGRA) 180 MG tablet Take 1 tablet (180 mg total) by mouth daily.  90 tablet  3  . FLUoxetine (PROZAC) 20 MG capsule Take 2 capsules (40 mg total) by mouth 2 (two) times daily.  180 capsule  3  . fluticasone (FLONASE) 50 MCG/ACT nasal spray Place 2 sprays into the nose daily. 1 spray in each nostril twice daily  48 g  3  . levothyroxine (SYNTHROID, LEVOTHROID) 112 MCG tablet Take 1 tablet (112 mcg total) by mouth daily.  90 tablet  3  . Omega-3 350 MG CAPS Take by mouth. Take once  capsule by mouth once a day       . omeprazole (PRILOSEC) 20 MG capsule Take 1 capsule (20 mg total) by mouth 2 (two) times daily. Take one 30-60 min before first and last meals of the day as long as coughing at all, then once daily before breakfast  180 capsule  3  . simvastatin (ZOCOR) 40 MG tablet Take 1 tablet (40 mg total) by mouth daily.  90 tablet  3  . vitamin E (VITAMIN E) 400 UNIT capsule Take 400 Units by mouth daily.        Marland Kitchen LORazepam (ATIVAN) 1 MG tablet 1/2 to 1 by mouth once daily as needed severe anxiety watch out of sedation      . Meth-Hyo-M Bl-Benz Acd-Ph Sal (PROSED/DS, ATROPINE FREE,) 81.6 MG TABS Take 1 tablet (81.6 mg total) by mouth 4 (four) times daily.  360 tablet  3   Current  Facility-Administered Medications  Medication Dose Route Frequency Provider Last Rate Last Dose  . 0.9 %  sodium chloride infusion  500 mL Intravenous Continuous Louis Meckel, MD         IMMUNIZATIONS:   Pneumovax was most recently given on 10/08/2009.  Previously the patient had received Pneumovax on 07/08/2006 and on  02/11/2000. No further vaccinations with Pneumovax are needed.  Flu shot was given in October 2013.  SMOKING HISTORY: The patient has never smoked cigarettes.     HISTORY:  Diamond Boyd was seen today for followup of her synchronous colon cancers dating back to August 2011 as well as the patient's history of a marginal zone lymphoma involving the bone marrow and peripheral blood dating back to 1994.  Diamond Boyd is here today with her husband Ray.  She was last seen by [place] on 01/15/2012.  Diamond Boyd underwent a CT scan of the abdomen and pelvis with IV contrast on 01/21/2012.  There was no significant adenopathy and no evidence for recurrent colon cancer.  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  Over the past 6 months, Diamond Boyd's condition has remained stable.  She denies any medical problems.  She denies specifically any pain, difficulty eating, trouble with her bowels, blood in the stool, fever, chills, night sweats, or any sense of ill health.  She is without complaints today.  PHYSICAL EXAM:  Diamond Boyd shows no major changes.  Weight is up about 5 pounds to 244 pounds 8 ounces.  Height 5 feet 6 inches, body surface area 2.27 m2.  Blood pressure 151/72.  Other vital signs are normal. There is no scleral icterus.  Mouth and pharynx with a tongue depressor showed no lesions.  I could not identify any abnormalities in the tonsillar area, particularly the left tonsil.  No peripheral adenopathy palpable.  No axillary or inguinal adenopathy.  Heart and lungs  were normal.  Breasts were not examined.  The patient does have regular yearly mammograms, most recently obtained on 08/20/2011.  Abdomen: Obese, nontender, with no organomegaly or masses palpable.  Extremities: Puffy with elastic stockings.  No actual pitting.  No clubbing, petechiae or purpura.  Neurologic:  Exam was grossly normal.  LABORATORY DATA:  Today, white count 6.5, ANC 1.5, hemoglobin 13.5, hematocrit 40.5, platelets 229,000.  Chemistries today were normal. Albumin 3.8, LDH 196.  CEA is pending.  CEA on 01/15/2012 was 1.5.  Iron studies from 01/15/2012 showed a ferritin of 103, iron saturation  27%. Iron studies from today are pending.  IMAGING STUDIES:  1. Digital screening mammogram from 08/05/2010 was  negative.  2. Chest x-ray, 2 view, from 12/09/2010 was negative.  3. Digital screening mammogram on 08/20/2011 showed no specific evidence of  malignancy  4. Chest x-ray, 2 view, from 11/19/2011 showed atherosclerotic  calcifications within the arch of the aorta. There was evidence of a  prior cholecystectomy and splenectomy, otherwise chest x-ray was  negative. 5. CT scan of abdomen and pelvis with IV contrast obtained on     01/21/2012 showed no clear evidence of colon cancer metastasis.  A     retrocrural lymph node had increased in size but periportal     adenopathy and periaortic adenopathy was stable to decreased.  The     retrocrural lymph node refer to measures 8 mm on image 12,     increased from 5 mm on a prior study. 6. Chest x-ray, 2 view, from 06/01/2012 showed no acute abnormalities.     There was borderline enlargement of the cardiac silhouette.   PROCEDURES: Colonoscopy was carried out by Dr. Melvia Heaps on  10/10/2010.    IMPRESSION AND PLAN:  Shakila continues to do well with no evidence of recurrent colon cancer now approaching 3 years from the time of diagnosis in August 2011.  In addition, the patient's marginal zone lymphoma continues to be  quiescent even though one of the lymph nodes removed at the time of colon cancer surgery in August 2011 was found to have B-cell non-Hodgkin lymphoma.  Diamond Boyd is on no treatment and in fact, has not required any therapy since May 2006 at which time she received Rituxan.  I should mention that Diamond Boyd's last colonoscopy took place on 10/10/2010. Diamond Boyd tells me that her next colonoscopy will be in 2015. We will plan to see Diamond Boyd again in 6 months at which time we will check CBC, chemistries, CEA and iron studies.    ______________________________ Samul Dada, M.D. DSM/MEDQ  D:  07/14/2012  T:  07/15/2012  Job:  [id number]

## 2012-07-20 DIAGNOSIS — J309 Allergic rhinitis, unspecified: Secondary | ICD-10-CM | POA: Diagnosis not present

## 2012-07-26 ENCOUNTER — Encounter: Payer: Self-pay | Admitting: Family Medicine

## 2012-07-27 DIAGNOSIS — J309 Allergic rhinitis, unspecified: Secondary | ICD-10-CM | POA: Diagnosis not present

## 2012-08-03 DIAGNOSIS — J309 Allergic rhinitis, unspecified: Secondary | ICD-10-CM | POA: Diagnosis not present

## 2012-08-10 DIAGNOSIS — J309 Allergic rhinitis, unspecified: Secondary | ICD-10-CM | POA: Diagnosis not present

## 2012-08-11 ENCOUNTER — Other Ambulatory Visit: Payer: Self-pay | Admitting: *Deleted

## 2012-08-11 MED ORDER — FEXOFENADINE HCL 180 MG PO TABS: 180.0000 mg | ORAL_TABLET | Freq: Every day | ORAL | Status: DC

## 2012-08-11 NOTE — Telephone Encounter (Signed)
Received 2nd faxed refill request from pharmacy. Refill called to pharmacy.

## 2012-08-11 NOTE — Telephone Encounter (Signed)
Received faxed refill request from pharmacy. Refill sent to pharmacy electronically. 

## 2012-08-17 DIAGNOSIS — J309 Allergic rhinitis, unspecified: Secondary | ICD-10-CM | POA: Diagnosis not present

## 2012-08-20 ENCOUNTER — Ambulatory Visit
Admission: RE | Admit: 2012-08-20 | Discharge: 2012-08-20 | Disposition: A | Discharge status: Home / Self Care | Payer: Medicare Other | Source: Ambulatory Visit | Attending: Family Medicine | Admitting: Family Medicine

## 2012-08-20 DIAGNOSIS — Z1231 Encounter for screening mammogram for malignant neoplasm of breast: Secondary | ICD-10-CM

## 2012-08-23 ENCOUNTER — Encounter: Payer: Self-pay | Admitting: *Deleted

## 2012-08-26 DIAGNOSIS — J309 Allergic rhinitis, unspecified: Secondary | ICD-10-CM | POA: Diagnosis not present

## 2012-08-31 DIAGNOSIS — J309 Allergic rhinitis, unspecified: Secondary | ICD-10-CM | POA: Diagnosis not present

## 2012-09-09 ENCOUNTER — Telehealth: Payer: Self-pay

## 2012-09-09 NOTE — Telephone Encounter (Signed)
For 3 days pt has sinus h/a, face hurts,S/T, rt earache; no fever; pt request antibiotic. Pt scheduled appt to see Dr Milinda Antis 09/10/12 at 12 noon. Pt also request lt knee pain checked. Advised pt if condition changes or worsens prior to appt pt will cb.

## 2012-09-10 ENCOUNTER — Encounter: Payer: Self-pay | Admitting: Family Medicine

## 2012-09-10 ENCOUNTER — Other Ambulatory Visit: Payer: Self-pay | Admitting: Family Medicine

## 2012-09-10 ENCOUNTER — Ambulatory Visit (INDEPENDENT_AMBULATORY_CARE_PROVIDER_SITE_OTHER): Payer: Medicare Other | Admitting: Family Medicine

## 2012-09-10 VITALS — BP 126/74 | HR 88 | Temp 98.6°F | Wt 242.8 lb

## 2012-09-10 DIAGNOSIS — J019 Acute sinusitis, unspecified: Secondary | ICD-10-CM | POA: Insufficient documentation

## 2012-09-10 DIAGNOSIS — M25562 Pain in left knee: Secondary | ICD-10-CM

## 2012-09-10 DIAGNOSIS — M25569 Pain in unspecified knee: Secondary | ICD-10-CM | POA: Diagnosis not present

## 2012-09-10 DIAGNOSIS — B9689 Other specified bacterial agents as the cause of diseases classified elsewhere: Secondary | ICD-10-CM

## 2012-09-10 MED ORDER — CYCLOBENZAPRINE HCL 10 MG PO TABS: 10.0000 mg | ORAL_TABLET | Freq: Three times a day (TID) | ORAL | Status: DC | PRN

## 2012-09-10 MED ORDER — AZITHROMYCIN 250 MG PO TABS: ORAL_TABLET | ORAL | Status: DC

## 2012-09-10 NOTE — Telephone Encounter (Signed)
Please refill times 3 

## 2012-09-10 NOTE — Patient Instructions (Addendum)
Take the zithromax as directed  Use nasal saline spray for congestion / breathe steam  Get rest and drink lots of fluids We will refer you to orthopedics at check out

## 2012-09-10 NOTE — Progress Notes (Signed)
Subjective:    Patient ID: Diamond Boyd, female    DOB: [birth year], 72 y.o.   MRN: [id number]  HPI Here for uri symptoms -poss sinus infection Bad headache in face L ear hurts for a week  Throat hurts on L side also  Some runny nose and congestion  No cough Is a little off balance - and ears feel full also Tylenol otc   Also knee pain on L -fell a year ago - and painful ever since - worse in past 4 mo Whole knee hurts No swelling  Really hurts to kneel on it  Wants a ref to orthopedics    Patient Active Problem List   Diagnosis Date Noted  . Thoracic back pain 06/01/2012  . Left-sided chest wall pain 06/01/2012  . Memory loss 06/01/2012  . Pain in finger of right hand 05/17/2012  . Skin lesion of face 05/17/2012  . Other screening mammogram 07/22/2011  . Routine gynecological examination 07/22/2011  . Colon cancer screening 07/22/2011  . Screening for lipoid disorders 07/17/2011  . Hyperlipidemia, mild 07/17/2011  . History of anemia 05/06/2011  . 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 Hypothyroid 02/24/2011  . Varicose veins 02/24/2011  . Elevated blood pressure 11/20/2010  . Obesity 11/20/2010  . 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 Other chronic sinusitis 07/03/2009  . THROAT PAIN, CHRONIC 07/03/2009  . GERD 12/20/2008  . Anxiety state, unspecified 08/22/2008  . MENOPAUSAL SYNDROME 07/13/2007  . HYPERCHOLESTEROLEMIA, PURE 07/08/2006  . ALLERGIC  RHINITIS 07/08/2006  . OVERACTIVE BLADDER 07/08/2006  . 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 DEPRESSION 06/25/2006  . PERIPHERAL VASCULAR DISEASE 06/25/2006  . OSTEOARTHRITIS 06/25/2006  . LEG EDEMA 06/25/2006  . SKIN CANCER, HX OF 06/25/2006  . COLONIC POLYPS, HX OF 06/25/2006   Past Medical History  Diagnosis Date  . Colon polyps   . Depression   . Hypothyroidism   . Osteoarthritis     hands  . Peripheral vascular disease   .  Degenerative disk disease     spine in center in past   . Cancer     skin cancer- basal cell on arm / colon 2011  . Lymphoma   . Colon cancer   . 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 Cholecystectomy    . Splenectomy      lymphoma  . Breast biopsy  1996  . Plantar fascia release    . Ventral hernia repair  1998  . Tendon release      Right thumb   . Colectomy  8/11  . Vaginal hysterectomy     History  Substance Use Topics  . Smoking status: Never Smoker   . Smokeless tobacco: Not on file  . Alcohol Use: No   Family History  Problem Relation Age of Onset  . Coronary artery disease Mother   . Alcohol abuse Mother   . Cancer Father     jaw  . Diabetes Brother   . Fibromyalgia Daughter     chronic pain   . COPD Daughter   . Colon cancer Neg Hx   . Colon polyps Neg Hx   . Stomach cancer Neg Hx    Allergies  Allergen Reactions  . Achromycin (Tetracycline Hcl)     Pt does not remember reaction  . Allopurinol     REACTION: Unsure of reaction happene years ago  . Cephalexin  REACTION: unsure of reaction happened yrs ago.  . Ciprofloxacin     REACTION: rash  . Codeine     REACTION: abd. pain  . Meloxicam     REACTION: GI symptoms  . Minocycline     Abdominal pain  . Nabumetone     REACTION: reaction not known  . Nyquil (Pseudoeph-Doxylamine-Dm-Apap) Hives  . Penicillins     REACTION: reaction not known  . Sulfonamide Derivatives     Abdominal pain  . Zolpidem Tartrate     REACTION: feels too drugged  . Buspar (Buspirone Hcl)     Dizziness, and not as effective for anxiety   Current Outpatient Prescriptions on File Prior to Visit  Medication Sig Dispense Refill  . Calcium Carbonate-Vitamin D (CALCIUM 600+D) 600-400 MG-UNIT per tablet Take 1 tablet by mouth daily.        . celecoxib (CELEBREX) 200 MG capsule Take 1 capsule (200 mg total) by mouth daily as needed for pain (with food).  90 capsule  3  .  cholecalciferol (VITAMIN D) 1000 UNITS tablet Take 1,000 Units by mouth daily.        . cyclobenzaprine (FLEXERIL) 10 MG tablet Take 1 tablet (10 mg total) by mouth 3 (three) times daily as needed for muscle spasms.  30 tablet  0  . estradiol (ESTRACE) 2 MG tablet Take 1 tablet (2 mg total) by mouth 2 (two) times daily.  180 tablet  3  . fexofenadine (ALLEGRA) 180 MG tablet Take 1 tablet (180 mg total) by mouth daily.  90 tablet  0  . fluticasone (FLONASE) 50 MCG/ACT nasal spray Place 2 sprays into the nose daily. 1 spray in each nostril twice daily  48 g  3  . levothyroxine (SYNTHROID, LEVOTHROID) 112 MCG tablet Take 1 tablet (112 mcg total) by mouth daily.  90 tablet  3  . LORazepam (ATIVAN) 1 MG tablet 1/2 to 1 by mouth once daily as needed severe anxiety watch out of sedation      . Meth-Hyo-M Bl-Benz Acd-Ph Sal (PROSED/DS, ATROPINE FREE,) 81.6 MG TABS Take 1 tablet (81.6 mg total) by mouth 4 (four) times daily.  360 tablet  3  . Omega-3 350 MG CAPS Take by mouth. Take once capsule by mouth once a day       . omeprazole (PRILOSEC) 20 MG capsule Take 1 capsule (20 mg total) by mouth 2 (two) times daily. Take one 30-60 min before first and last meals of the day as long as coughing at all, then once daily before breakfast  180 capsule  3  . simvastatin (ZOCOR) 40 MG tablet Take 1 tablet (40 mg total) by mouth daily.  90 tablet  3  . vitamin E (VITAMIN E) 400 UNIT capsule Take 400 Units by mouth daily.        Marland Kitchen FLUoxetine (PROZAC) 20 MG capsule Take 2 capsules (40 mg total) by mouth 2 (two) times daily.  180 capsule  3   Current Facility-Administered Medications on File Prior to Visit  Medication Dose Route Frequency Provider Last Rate Last Dose  . 0.9 %  sodium chloride infusion  500 mL Intravenous Continuous Louis Meckel, MD         Review of Systems Review of Systems  Constitutional: Negative for fever, appetite change, and unexpected weight change.  ENT pos for sinus pain/ congestion/ st  and ear pain/ neg for ear drainage Eyes: Negative for pain and visual disturbance.  Respiratory: Negative for cough and  shortness of breath.  neg for wheezing Cardiovascular: Negative for cp or palpitations    Gastrointestinal: Negative for nausea, diarrhea and constipation.  Genitourinary: Negative for urgency and frequency.  Skin: Negative for pallor or rash   MSK pos for chronic back pain and L knee pain , neg for joint swelling or redness  Neurological: Negative for weakness, light-headedness, numbness and headaches.  Hematological: Negative for adenopathy. Does not bruise/bleed easily.  Psychiatric/Behavioral: Negative for dysphoric mood. The patient is not nervous/anxious.         Objective:   Physical Exam  Constitutional: She appears well-developed and well-nourished. No distress.  obese and well appearing   HENT:  Head: Normocephalic and atraumatic.  Right Ear: External ear normal.  Mouth/Throat: Oropharynx is clear and moist. No oropharyngeal exudate.  Nares are injected and congested  bilat maxillary sinus tenderness TM L is dull  No redness  Eyes: Conjunctivae and EOM are normal. Pupils are equal, round, and reactive to light. Right eye exhibits no discharge. Left eye exhibits no discharge.  Neck: Normal range of motion. Neck supple.  Cardiovascular: Normal rate and regular rhythm.   Pulmonary/Chest: Effort normal and breath sounds normal. No respiratory distress. She has no wheezes. She has no rales.  Musculoskeletal: She exhibits tenderness. She exhibits no edema.  L knee -tender over med and lat joint line and patellofemoral tendon Stable  Nl gait  No eff or swelling   Lymphadenopathy:    She has no cervical adenopathy.  Neurological: She is alert. She has normal reflexes.  Skin: Skin is warm and dry. No rash noted.  Psychiatric: She has a normal mood and affect.          Assessment & Plan:

## 2012-09-12 NOTE — Assessment & Plan Note (Signed)
Ref to ortho per request for knee pain  ? If poss meniscal injury Would benefit from wt loss

## 2012-09-12 NOTE — Assessment & Plan Note (Signed)
In pt with past hx of lymphoma  tx with zithromax  Disc symptomatic care - see instructions on AVS  Update if not starting to improve in a week or if worsening

## 2012-09-13 DIAGNOSIS — M19049 Primary osteoarthritis, unspecified hand: Secondary | ICD-10-CM | POA: Diagnosis not present

## 2012-09-13 DIAGNOSIS — M25569 Pain in unspecified knee: Secondary | ICD-10-CM | POA: Diagnosis not present

## 2012-09-17 ENCOUNTER — Other Ambulatory Visit: Payer: Medicare Other

## 2012-09-17 ENCOUNTER — Telehealth (INDEPENDENT_AMBULATORY_CARE_PROVIDER_SITE_OTHER): Payer: Medicare Other | Admitting: Family Medicine

## 2012-09-17 DIAGNOSIS — E785 Hyperlipidemia, unspecified: Secondary | ICD-10-CM | POA: Diagnosis not present

## 2012-09-17 DIAGNOSIS — Z0000 Encounter for general adult medical examination without abnormal findings: Secondary | ICD-10-CM

## 2012-09-17 DIAGNOSIS — E039 Hypothyroidism, unspecified: Secondary | ICD-10-CM | POA: Diagnosis not present

## 2012-09-17 DIAGNOSIS — Z862 Personal history of diseases of the blood and blood-forming organs and certain disorders involving the immune mechanism: Secondary | ICD-10-CM | POA: Diagnosis not present

## 2012-09-17 LAB — COMPREHENSIVE METABOLIC PANEL
ALT: 21 U/L (ref 0–35)
AST: 23 U/L (ref 0–37)
Albumin: 4 g/dL (ref 3.5–5.2)
Calcium: 9.5 mg/dL (ref 8.4–10.5)
Chloride: 100 mEq/L (ref 96–112)
Potassium: 4.3 mEq/L (ref 3.5–5.1)
Total Protein: 5.9 g/dL — ABNORMAL LOW (ref 6.0–8.3)

## 2012-09-17 LAB — CBC WITH DIFFERENTIAL/PLATELET
Basophils Absolute: 0 10*3/uL (ref 0.0–0.1)
Basophils Relative: 0.3 % (ref 0.0–3.0)
Eosinophils Absolute: 0.1 10*3/uL (ref 0.0–0.7)
Lymphocytes Relative: 41.8 % (ref 12.0–46.0)
MCHC: 32.6 g/dL (ref 30.0–36.0)
Neutrophils Relative %: 37.4 % — ABNORMAL LOW (ref 43.0–77.0)
RBC: 4.22 Mil/uL (ref 3.87–5.11)

## 2012-09-17 LAB — LIPID PANEL: Total CHOL/HDL Ratio: 4

## 2012-09-17 LAB — LDL CHOLESTEROL, DIRECT: Direct LDL: 133 mg/dL

## 2012-09-17 NOTE — Telephone Encounter (Signed)
Lab order for CPX

## 2012-09-21 DIAGNOSIS — J309 Allergic rhinitis, unspecified: Secondary | ICD-10-CM | POA: Diagnosis not present

## 2012-09-22 ENCOUNTER — Encounter: Payer: Self-pay | Admitting: Family Medicine

## 2012-09-22 ENCOUNTER — Other Ambulatory Visit: Payer: Self-pay | Admitting: Family Medicine

## 2012-09-22 ENCOUNTER — Ambulatory Visit (INDEPENDENT_AMBULATORY_CARE_PROVIDER_SITE_OTHER): Payer: Medicare Other | Admitting: Family Medicine

## 2012-09-22 VITALS — BP 138/78 | HR 84 | Temp 98.3°F | Ht 65.0 in | Wt 244.5 lb

## 2012-09-22 DIAGNOSIS — E785 Hyperlipidemia, unspecified: Secondary | ICD-10-CM | POA: Diagnosis not present

## 2012-09-22 DIAGNOSIS — E7800 Pure hypercholesterolemia, unspecified: Secondary | ICD-10-CM | POA: Diagnosis not present

## 2012-09-22 DIAGNOSIS — Z0000 Encounter for general adult medical examination without abnormal findings: Secondary | ICD-10-CM | POA: Diagnosis not present

## 2012-09-22 DIAGNOSIS — IMO0002 Reserved for concepts with insufficient information to code with codable children: Secondary | ICD-10-CM

## 2012-09-22 DIAGNOSIS — E669 Obesity, unspecified: Secondary | ICD-10-CM

## 2012-09-22 DIAGNOSIS — N8111 Cystocele, midline: Secondary | ICD-10-CM

## 2012-09-22 DIAGNOSIS — E039 Hypothyroidism, unspecified: Secondary | ICD-10-CM | POA: Diagnosis not present

## 2012-09-22 MED ORDER — OMEPRAZOLE 20 MG PO CPDR: 20.0000 mg | DELAYED_RELEASE_CAPSULE | Freq: Two times a day (BID) | ORAL | Status: DC

## 2012-09-22 MED ORDER — FLUTICASONE PROPIONATE 50 MCG/ACT NA SUSP: 2.0000 | Freq: Every day | NASAL | Status: DC

## 2012-09-22 MED ORDER — METH-HYO-M BL-BENZ ACD-PH SAL 81.6 MG PO TABS: ORAL_TABLET | ORAL | Status: DC

## 2012-09-22 MED ORDER — SIMVASTATIN 40 MG PO TABS: 40.0000 mg | ORAL_TABLET | Freq: Every day | ORAL | Status: DC

## 2012-09-22 MED ORDER — CELECOXIB 200 MG PO CAPS: 200.0000 mg | ORAL_CAPSULE | Freq: Every day | ORAL | Status: DC | PRN

## 2012-09-22 MED ORDER — ESTRADIOL 2 MG PO TABS: 2.0000 mg | ORAL_TABLET | Freq: Two times a day (BID) | ORAL | Status: DC

## 2012-09-22 MED ORDER — FEXOFENADINE HCL 180 MG PO TABS: 180.0000 mg | ORAL_TABLET | Freq: Every day | ORAL | Status: DC

## 2012-09-22 MED ORDER — LEVOTHYROXINE SODIUM 112 MCG PO TABS: 112.0000 ug | ORAL_TABLET | Freq: Every day | ORAL | Status: DC

## 2012-09-22 MED ORDER — FLUOXETINE HCL 20 MG PO CAPS: 40.0000 mg | ORAL_CAPSULE | Freq: Two times a day (BID) | ORAL | Status: DC

## 2012-09-22 NOTE — Progress Notes (Signed)
Subjective:    Patient ID: Diamond Boyd, female    DOB: 12-20-1940, 72 y.o.   MRN: [id number]  HPI I have personally reviewed the Medicare Annual Wellness questionnaire and have noted 1. The patient's medical and social history 2. Their use of alcohol, tobacco or illicit drugs 3. Their current medications and supplements 4. The patient's functional ability including ADL's, fall risks, home safety risks and hearing or visual             impairment. 5. Diet and physical activities 6. Evidence for depression or mood disorders  Feeling ok  Just had a cortisone shot in her L knee - now has some pain - now that foot is sore  Wt is up 2 lb  Has been walking more   Tries to eat a healthy diet/avoids fats    The patients weight, height, BMI have been recorded in the chart and visual acuity is per eye clinic.  I have made referrals, counseling and provided education to the patient based review of the above and I have provided the pt with a written personalized care plan for preventive services.  See scanned forms.  Routine anticipatory guidance given to patient.  See health maintenance. Flu 10/13 Shingles - had the vaccine -paid out of pocket -98$ PNA 8/11 is done with those  Tetanus shot was 12/07 Colon 8/12 - has 3 year recall for neoplasm  Breast cancer screening 7/14 was normal  Self exam -no lumps on exam  Pap was 6/13 had hysterectomy - does not need pap, but she does have urine incontinence issues (stress incontinence)-- and chronic dysuria -takes med for that , and also she gets up at night to urinate Advance directive-- has a living will , and poa is her husband  Cognitive function addressed- see scanned forms- and if abnormal then additional documentation follows. - memory is the same-no worse , nothing significant   Falls-none in the past year   Mood - about the same same/ good attitude and stays motivated   Hypothyroidism  Pt has no clinical changes No change in  energy level/ hair or skin/ edema and no tremor Lab Results  Component Value Date   TSH 3.03 09/17/2012     hyprelipidemia Lab Results  Component Value Date   CHOL 208* 09/17/2012   CHOL 204* 07/18/2011   CHOL 214* 02/19/2011   Lab Results  Component Value Date   HDL 49.40 09/17/2012   HDL 60.70 07/18/2011   HDL 56.60 02/19/2011   Lab Results  Component Value Date   LDLCALC 105* 07/18/2009   LDLCALC 100* 07/05/2008   LDLCALC 106* 06/10/2007   Lab Results  Component Value Date   TRIG 174.0* 09/17/2012   TRIG 140.0 07/18/2011   TRIG 185.0* 02/19/2011   Lab Results  Component Value Date   CHOLHDL 4 09/17/2012   CHOLHDL 3 07/18/2011   CHOLHDL 4 02/19/2011   Lab Results  Component Value Date   LDLDIRECT 133.0 09/17/2012   LDLDIRECT 134.0 07/18/2011   LDLDIRECT 126.0 02/19/2011    HDL down with less activity   Sugar is 93-ok She tends to get hypoglycemia    PMH and SH reviewed  Meds, vitals, and allergies reviewed.   ROS: See HPI.  Otherwise negative.     Patient Active Problem List   Diagnosis Date Noted  . Cystocele 09/22/2012  . Encounter for Medicare annual wellness exam 09/17/2012  . Acute bacterial sinusitis 09/10/2012  . Left knee pain 09/10/2012  .  Thoracic back pain 06/01/2012  . Left-sided chest wall pain 06/01/2012  . Memory loss 06/01/2012  . Pain in finger of right hand 05/17/2012  . Skin lesion of face 05/17/2012  . Other screening mammogram 07/22/2011  . Routine gynecological examination 07/22/2011  . Colon cancer screening 07/22/2011  . Screening for lipoid disorders 07/17/2011  . Hyperlipidemia, mild 07/17/2011  . History of anemia 05/06/2011  . 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 Hypothyroid 02/24/2011  . Varicose veins 02/24/2011  . Elevated blood pressure 11/20/2010  . Obesity 11/20/2010  . 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 Other chronic sinusitis 07/03/2009  . THROAT PAIN, CHRONIC 07/03/2009   . GERD 12/20/2008  . Anxiety state, unspecified 08/22/2008  . MENOPAUSAL SYNDROME 07/13/2007  . HYPERCHOLESTEROLEMIA, PURE 07/08/2006  . ALLERGIC  RHINITIS 07/08/2006  . OVERACTIVE BLADDER 07/08/2006  . 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 DEPRESSION 06/25/2006  . PERIPHERAL VASCULAR DISEASE 06/25/2006  . OSTEOARTHRITIS 06/25/2006  . LEG EDEMA 06/25/2006  . SKIN CANCER, HX OF 06/25/2006  . COLONIC POLYPS, HX OF 06/25/2006   Past Medical History  Diagnosis Date  . Colon polyps   . Depression   . Hypothyroidism   . Osteoarthritis     hands  . Peripheral vascular disease   . Degenerative disk disease     spine in center in past   . Cancer     skin cancer- basal cell on arm / colon 2011  . Lymphoma   . Colon cancer   . 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 Cholecystectomy    . Splenectomy      lymphoma  . Breast biopsy  1996  . Plantar fascia release    . Ventral hernia repair  1998  . Tendon release      Right thumb   . Colectomy  8/11  . Vaginal hysterectomy     History  Substance Use Topics  . Smoking status: Never Smoker   . Smokeless tobacco: Not on file  . Alcohol Use: No   Family History  Problem Relation Age of Onset  . Coronary artery disease Mother   . Alcohol abuse Mother   . Cancer Father     jaw  . Diabetes Brother   . Fibromyalgia Daughter     chronic pain   . COPD Daughter   . Colon cancer Neg Hx   . Colon polyps Neg Hx   . Stomach cancer Neg Hx    Allergies  Allergen Reactions  . Achromycin [Tetracycline Hcl]     Pt does not remember reaction  . Allopurinol     REACTION: Unsure of reaction happene years ago  . Cephalexin     REACTION: unsure of reaction happened yrs ago.  . Ciprofloxacin     REACTION: rash  . Codeine     REACTION: abd. pain  . Meloxicam     REACTION: GI symptoms  . Minocycline     Abdominal pain  . Nabumetone     REACTION: reaction not known  . Nyquil  [Pseudoeph-Doxylamine-Dm-Apap] Hives  . Penicillins     REACTION: reaction not known  . Sulfonamide Derivatives     Abdominal pain  . Zolpidem Tartrate     REACTION: feels too drugged  . Buspar [Buspirone Hcl]     Dizziness, and not as effective for anxiety   Current Outpatient Prescriptions on File Prior to  Visit  Medication Sig Dispense Refill  . Calcium Carbonate-Vitamin D (CALCIUM 600+D) 600-400 MG-UNIT per tablet Take 1 tablet by mouth daily.        . celecoxib (CELEBREX) 200 MG capsule Take 1 capsule (200 mg total) by mouth daily as needed for pain (with food).  90 capsule  3  . cholecalciferol (VITAMIN D) 1000 UNITS tablet Take 1,000 Units by mouth daily.        . cyclobenzaprine (FLEXERIL) 10 MG tablet Take 1 tablet (10 mg total) by mouth 3 (three) times daily as needed for muscle spasms.  30 tablet  3  . estradiol (ESTRACE) 2 MG tablet Take 1 tablet (2 mg total) by mouth 2 (two) times daily.  180 tablet  3  . fexofenadine (ALLEGRA) 180 MG tablet Take 1 tablet (180 mg total) by mouth daily.  90 tablet  0  . FLUoxetine (PROZAC) 20 MG capsule Take 2 capsules (40 mg total) by mouth 2 (two) times daily.  180 capsule  3  . fluticasone (FLONASE) 50 MCG/ACT nasal spray Place 2 sprays into the nose daily. 1 spray in each nostril twice daily  48 g  3  . levothyroxine (SYNTHROID, LEVOTHROID) 112 MCG tablet Take 1 tablet (112 mcg total) by mouth daily.  90 tablet  3  . Meth-Hyo-M Bl-Benz Acd-Ph Sal (PROSED/DS, ATROPINE FREE,) 81.6 MG TABS Take 1 tablet (81.6 mg total) by mouth 4 (four) times daily.  360 tablet  3  . Omega-3 350 MG CAPS Take by mouth. Take once capsule by mouth once a day       . omeprazole (PRILOSEC) 20 MG capsule Take 1 capsule (20 mg total) by mouth 2 (two) times daily. Take one 30-60 min before first and last meals of the day as long as coughing at all, then once daily before breakfast  180 capsule  3  . simvastatin (ZOCOR) 40 MG tablet Take 1 tablet (40 mg total) by mouth  daily.  90 tablet  3  . vitamin E (VITAMIN E) 400 UNIT capsule Take 400 Units by mouth daily.         Current Facility-Administered Medications on File Prior to Visit  Medication Dose Route Frequency Provider Last Rate Last Dose  . 0.9 %  sodium chloride infusion  500 mL Intravenous Continuous Louis Meckel, MD        Review of Systems Review of Systems  Constitutional: Negative for fever, appetite change, fatigue and unexpected weight change.  Eyes: Negative for pain and visual disturbance.  Respiratory: Negative for cough and shortness of breath.   Cardiovascular: Negative for cp or palpitations    Gastrointestinal: Negative for nausea, diarrhea and constipation.  Genitourinary: Negative for urgency and frequency. pos for stress incontinence with bladder prolapse Skin: Negative for pallor or rash   Neurological: Negative for weakness, light-headedness, numbness and headaches.  Hematological: Negative for adenopathy. Does not bruise/bleed easily.  Psychiatric/Behavioral: Negative for dysphoric mood. The patient is not nervous/anxious.         Objective:   Physical Exam  Constitutional: She appears well-developed and well-nourished. No distress.  obese and well appearing   HENT:  Head: Normocephalic and atraumatic.  Right Ear: External ear normal.  Left Ear: External ear normal.  Nose: Nose normal.  Mouth/Throat: Oropharynx is clear and moist.  Eyes: Conjunctivae and EOM are normal. Pupils are equal, round, and reactive to light. Right eye exhibits no discharge. Left eye exhibits no discharge. No scleral icterus.  Neck: Normal  range of motion. Neck supple. No JVD present. Carotid bruit is not present. No thyromegaly present.  Cardiovascular: Normal rate, regular rhythm, normal heart sounds and intact distal pulses.  Exam reveals no gallop.   Pulmonary/Chest: Effort normal and breath sounds normal. No respiratory distress. She has no wheezes. She exhibits no tenderness.   Abdominal: Soft. Bowel sounds are normal. She exhibits no distension, no abdominal bruit and no mass. There is no tenderness.  Genitourinary: No breast swelling, tenderness, discharge or bleeding. There is no rash, tenderness or lesion on the right labia. There is no rash, tenderness or lesion on the left labia. No erythema or bleeding around the vagina.  Cystocele noted   Breast exam: No mass, nodules, thickening, tenderness, bulging, retraction, inflamation, nipple discharge or skin changes noted.  No axillary or clavicular LA.  Chaperoned exam.    Musculoskeletal: She exhibits no edema and no tenderness.  Lymphadenopathy:    She has no cervical adenopathy.  Neurological: She is alert. She has normal reflexes. No cranial nerve deficit. She exhibits normal muscle tone. Coordination normal.  Skin: Skin is warm and dry. No rash noted. No erythema. No pallor.  Psychiatric: She has a normal mood and affect.          Assessment & Plan:

## 2012-09-22 NOTE — Patient Instructions (Addendum)
Keep working on a healthy diet and exercise for weight loss  Take care of yourself  If you want to see a urologist for bladder issues call us for a referral

## 2012-09-23 NOTE — Assessment & Plan Note (Signed)
Somewhat bothersome with stress incontinence Pt will let me know if symptoms worsen- desires urol ref Disc kegel exercises

## 2012-09-23 NOTE — Assessment & Plan Note (Signed)
Disc goals for lipids and reasons to control them Rev labs with pt Rev low sat fat diet in detail On zocor and diet  

## 2012-09-23 NOTE — Assessment & Plan Note (Signed)
Hypothyroidism  Pt has no clinical changes No change in energy level/ hair or skin/ edema and no tremor Lab Results  Component Value Date   TSH 3.03 09/17/2012

## 2012-09-23 NOTE — Assessment & Plan Note (Signed)
Discussed how this problem influences overall health and the risks it imposes  Reviewed plan for weight loss with lower calorie diet (via better food choices and also portion control or program like weight watchers) and exercise building up to or more than 30 minutes 5 days per week including some aerobic activity    

## 2012-09-23 NOTE — Assessment & Plan Note (Signed)
Reviewed health habits including diet and exercise and skin cancer prevention Also reviewed health mt list, fam hx and immunizations  See HPI Wellness labs rev in detail 

## 2012-09-28 DIAGNOSIS — J309 Allergic rhinitis, unspecified: Secondary | ICD-10-CM | POA: Diagnosis not present

## 2012-10-05 DIAGNOSIS — J309 Allergic rhinitis, unspecified: Secondary | ICD-10-CM | POA: Diagnosis not present

## 2012-10-08 ENCOUNTER — Telehealth: Payer: Self-pay | Admitting: *Deleted

## 2012-10-08 NOTE — Telephone Encounter (Signed)
Fax from [facility] for generic levothyroxine in your IN box.

## 2012-10-12 DIAGNOSIS — J309 Allergic rhinitis, unspecified: Secondary | ICD-10-CM | POA: Diagnosis not present

## 2012-10-19 DIAGNOSIS — J309 Allergic rhinitis, unspecified: Secondary | ICD-10-CM | POA: Diagnosis not present

## 2012-10-26 DIAGNOSIS — J309 Allergic rhinitis, unspecified: Secondary | ICD-10-CM | POA: Diagnosis not present

## 2012-10-28 DIAGNOSIS — Z23 Encounter for immunization: Secondary | ICD-10-CM | POA: Diagnosis not present

## 2012-11-02 DIAGNOSIS — J309 Allergic rhinitis, unspecified: Secondary | ICD-10-CM | POA: Diagnosis not present

## 2012-11-09 DIAGNOSIS — M25569 Pain in unspecified knee: Secondary | ICD-10-CM | POA: Diagnosis not present

## 2012-11-11 DIAGNOSIS — J309 Allergic rhinitis, unspecified: Secondary | ICD-10-CM | POA: Diagnosis not present

## 2012-11-16 DIAGNOSIS — J309 Allergic rhinitis, unspecified: Secondary | ICD-10-CM | POA: Diagnosis not present

## 2012-11-18 DIAGNOSIS — M25569 Pain in unspecified knee: Secondary | ICD-10-CM | POA: Diagnosis not present

## 2012-11-22 ENCOUNTER — Ambulatory Visit (INDEPENDENT_AMBULATORY_CARE_PROVIDER_SITE_OTHER): Payer: Medicare Other | Admitting: Family Medicine

## 2012-11-22 ENCOUNTER — Encounter: Payer: Self-pay | Admitting: Family Medicine

## 2012-11-22 VITALS — BP 110/56 | HR 93 | Temp 98.7°F | Ht 65.0 in | Wt 249.8 lb

## 2012-11-22 DIAGNOSIS — R102 Pelvic and perineal pain: Secondary | ICD-10-CM | POA: Insufficient documentation

## 2012-11-22 DIAGNOSIS — B029 Zoster without complications: Secondary | ICD-10-CM

## 2012-11-22 DIAGNOSIS — N949 Unspecified condition associated with female genital organs and menstrual cycle: Secondary | ICD-10-CM | POA: Diagnosis not present

## 2012-11-22 LAB — POCT WET PREP (WET MOUNT): KOH Wet Prep POC: NEGATIVE

## 2012-11-22 MED ORDER — VALACYCLOVIR HCL 1 G PO TABS: 1000.0000 mg | ORAL_TABLET | Freq: Three times a day (TID) | ORAL | Status: DC

## 2012-11-22 MED ORDER — PROSED/DS 81.6 MG PO TABS: 1.0000 | ORAL_TABLET | Freq: Four times a day (QID) | ORAL | Status: DC

## 2012-11-22 MED ORDER — TRAMADOL HCL 50 MG PO TABS: 50.0000 mg | ORAL_TABLET | Freq: Three times a day (TID) | ORAL | Status: DC | PRN

## 2012-11-22 MED ORDER — BETAMETHASONE DIPROPIONATE 0.05 % EX CREA: TOPICAL_CREAM | Freq: Every day | CUTANEOUS | Status: DC

## 2012-11-22 NOTE — Assessment & Plan Note (Signed)
On L buttock - with pain rad down post leg Will tx with valtrex 1 g tid for 7d Tramadol for pain Disc care of rash-keep clean and dry  Re check in 2 wk

## 2012-11-22 NOTE — Progress Notes (Signed)
Subjective:    Patient ID: Diamond Boyd, female    DOB: 01/18/1941, 72 y.o.   MRN: [id number]  HPI Here with a rash in the vaginal area  And also buttock on L side  It burns and itches  Started last Tuesday - had intercourse and noted dryness/ discomfort  Very red area  No vaginal discharge  Symptoms are mostly on the outside   She uses pain patches for her back  Leg pain - has been massaging the area  Some nasal congestion L ear was hurting  Allergy season and post nasal drip  Patient Active Problem List   Diagnosis Date Noted  . Cystocele 09/22/2012  . Encounter for Medicare annual wellness exam 09/17/2012  . Acute bacterial sinusitis 09/10/2012  . Left knee pain 09/10/2012  . Thoracic back pain 06/01/2012  . Skin lesion of face 05/17/2012  . Other screening mammogram 07/22/2011  . Routine gynecological examination 07/22/2011  . Colon cancer screening 07/22/2011  . Screening for lipoid disorders 07/17/2011  . History of anemia 05/06/2011  . 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 Hypothyroid 02/24/2011  . Varicose veins 02/24/2011  . Elevated blood pressure 11/20/2010  . Obesity 11/20/2010  . 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 Other chronic sinusitis 07/03/2009  . THROAT PAIN, CHRONIC 07/03/2009  . GERD 12/20/2008  . Anxiety state, unspecified 08/22/2008  . MENOPAUSAL SYNDROME 07/13/2007  . HYPERCHOLESTEROLEMIA, PURE 07/08/2006  . ALLERGIC  RHINITIS 07/08/2006  . OVERACTIVE BLADDER 07/08/2006  . 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 DEPRESSION 06/25/2006  . PERIPHERAL VASCULAR DISEASE 06/25/2006  . OSTEOARTHRITIS 06/25/2006  . LEG EDEMA 06/25/2006  . SKIN CANCER, HX OF 06/25/2006  . COLONIC POLYPS, HX OF 06/25/2006   Past Medical History  Diagnosis Date  . Colon polyps   . Depression   . Hypothyroidism   . Osteoarthritis     hands  . Peripheral vascular disease   . Degenerative disk  disease     spine in center in past   . Cancer     skin cancer- basal cell on arm / colon 2011  . Lymphoma   . Colon cancer   . 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 Cholecystectomy    . Splenectomy      lymphoma  . Breast biopsy  1996  . Plantar fascia release    . Ventral hernia repair  1998  . Tendon release      Right thumb   . Colectomy  8/11  . Vaginal hysterectomy     History  Substance Use Topics  . Smoking status: Never Smoker   . Smokeless tobacco: Not on file  . Alcohol Use: No   Family History  Problem Relation Age of Onset  . Coronary artery disease Mother   . Alcohol abuse Mother   . Cancer Father     jaw  . Diabetes Brother   . Fibromyalgia Daughter     chronic pain   . COPD Daughter   . Colon cancer Neg Hx   . Colon polyps Neg Hx   . Stomach cancer Neg Hx    Allergies  Allergen Reactions  . Achromycin [Tetracycline Hcl]     Pt does not remember reaction  . Allopurinol     REACTION: Unsure of reaction happene years ago  . Cephalexin     REACTION: unsure of reaction happened yrs ago.  Marland Kitchen  Ciprofloxacin     REACTION: rash  . Codeine     REACTION: abd. pain  . Meloxicam     REACTION: GI symptoms  . Minocycline     Abdominal pain  . Nabumetone     REACTION: reaction not known  . Nyquil [Pseudoeph-Doxylamine-Dm-Apap] Hives  . Penicillins     REACTION: reaction not known  . Sulfonamide Derivatives     Abdominal pain  . Zolpidem Tartrate     REACTION: feels too drugged  . Buspar [Buspirone Hcl]     Dizziness, and not as effective for anxiety   Current Outpatient Prescriptions on File Prior to Visit  Medication Sig Dispense Refill  . Calcium Carbonate-Vitamin D (CALCIUM 600+D) 600-400 MG-UNIT per tablet Take 1 tablet by mouth daily.        . celecoxib (CELEBREX) 200 MG capsule Take 1 capsule (200 mg total) by mouth daily as needed for pain (with food).  90 capsule  3  . cholecalciferol  (VITAMIN D) 1000 UNITS tablet Take 1,000 Units by mouth daily.        . cyclobenzaprine (FLEXERIL) 10 MG tablet Take 1 tablet (10 mg total) by mouth 3 (three) times daily as needed for muscle spasms.  30 tablet  3  . estradiol (ESTRACE) 2 MG tablet Take 1 tablet (2 mg total) by mouth 2 (two) times daily.  180 tablet  3  . fexofenadine (ALLEGRA) 180 MG tablet Take 1 tablet (180 mg total) by mouth daily.  90 tablet  3  . FLUoxetine (PROZAC) 20 MG capsule Take 2 capsules (40 mg total) by mouth 2 (two) times daily.  180 capsule  3  . fluticasone (FLONASE) 50 MCG/ACT nasal spray Place 2 sprays into the nose daily. 1 spray in each nostril twice daily  48 g  3  . levothyroxine (SYNTHROID, LEVOTHROID) 112 MCG tablet Take 1 tablet (112 mcg total) by mouth daily.  90 tablet  3  . Omega-3 350 MG CAPS Take by mouth. Take once capsule by mouth once a day       . omeprazole (PRILOSEC) 20 MG capsule Take 1 capsule (20 mg total) by mouth 2 (two) times daily. Take one 30-60 min before first and last meals of the day as long as coughing at all, then once daily before breakfast  180 capsule  3  . psyllium (METAMUCIL) 58.6 % packet Take 1 packet by mouth daily.      . simvastatin (ZOCOR) 40 MG tablet Take 1 tablet (40 mg total) by mouth daily.  90 tablet  3  . vitamin E (VITAMIN E) 400 UNIT capsule Take 400 Units by mouth daily.         Current Facility-Administered Medications on File Prior to Visit  Medication Dose Route Frequency Provider Last Rate Last Dose  . 0.9 %  sodium chloride infusion  500 mL Intravenous Continuous Louis Meckel, MD          Review of Systems Review of Systems  Constitutional: Negative for fever, appetite change, fatigue and unexpected weight change.  Eyes: Negative for pain and visual disturbance.  Respiratory: Negative for cough and shortness of breath.   Cardiovascular: Negative for cp or palpitations    Gastrointestinal: Negative for nausea, diarrhea and constipation.   Genitourinary: pos for urgency and frequency. -this is baseline as she has been out of her urised for a while due to cost Neg for blood in urine Skin: Negative for pallor and pos for rash  Neurological: Negative for weakness, light-headedness, numbness and headaches.  Hematological: Negative for adenopathy. Does not bruise/bleed easily.  Psychiatric/Behavioral: Negative for dysphoric mood. The patient is not nervous/anxious.         Objective:   Physical Exam  Constitutional: She appears well-developed and well-nourished. No distress.  obese and well appearing   HENT:  Head: Normocephalic and atraumatic.  Right Ear: External ear normal.  Left Ear: External ear normal.  Mouth/Throat: Oropharynx is clear and moist.  Boggy nares   Eyes: Conjunctivae and EOM are normal. Pupils are equal, round, and reactive to light. No scleral icterus.  Neck: Normal range of motion. Neck supple.  Cardiovascular: Normal rate and regular rhythm.   Abdominal:  Inguinal LN noted on L side   Genitourinary:  Labia minora appear somewhat macerated with slt white discoloration resembling lichen sclerosis  Vaginal atropy noted   On L buttock down to top of thigh- vesicular rash in patches resembling herpes zoster   Musculoskeletal: She exhibits edema.  Lymphadenopathy:    She has no cervical adenopathy.  Neurological: She is alert.  Skin: Rash noted. There is erythema. No pallor.  Vesicular rash on L buttock in patches - down to top of post thigh  Psychiatric: She has a normal mood and affect.          Assessment & Plan:

## 2012-11-22 NOTE — Patient Instructions (Signed)
Use the diprolene cream on affected vaginal area  Take the valtrex as directed for shingles  Tramadol as needed for pain  Follow up with me in about 2 weeks for a re check

## 2012-11-22 NOTE — Assessment & Plan Note (Signed)
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

## 2012-12-01 ENCOUNTER — Ambulatory Visit (INDEPENDENT_AMBULATORY_CARE_PROVIDER_SITE_OTHER): Payer: Medicare Other | Admitting: Family Medicine

## 2012-12-01 ENCOUNTER — Ambulatory Visit: Payer: Medicare Other | Admitting: Family Medicine

## 2012-12-01 ENCOUNTER — Encounter: Payer: Self-pay | Admitting: Family Medicine

## 2012-12-01 VITALS — BP 126/68 | HR 91 | Temp 98.4°F | Ht 65.0 in | Wt 242.8 lb

## 2012-12-01 DIAGNOSIS — B029 Zoster without complications: Secondary | ICD-10-CM | POA: Diagnosis not present

## 2012-12-01 MED ORDER — TRAMADOL HCL 50 MG PO TABS: 50.0000 mg | ORAL_TABLET | Freq: Three times a day (TID) | ORAL | Status: DC | PRN

## 2012-12-01 MED ORDER — SULFAMETHOXAZOLE-TMP DS 800-160 MG PO TABS: 1.0000 | ORAL_TABLET | Freq: Two times a day (BID) | ORAL | Status: DC

## 2012-12-01 NOTE — Patient Instructions (Signed)
Shingles is looking better - but I want to cover for a bacterial infection so take the bactrim as directed (take it with food- if GI upset let me know ) Finish valtrex You may increase the tramadol to 1-2 pills every 8 hours Tylenol is ok  Follow up as planned If worse in the meantime - alert me

## 2012-12-01 NOTE — Progress Notes (Signed)
Subjective:    Patient ID: Diamond Boyd, female    DOB: 1940-11-07, 72 y.o.   MRN: [id number]  HPI Shingles is hurting more  Some greenish spots -- ? If draining  L labia is still swelled - cream is helping a bit   Had fever of 100.7- once,  No fever today   She takes tramadol for pain 1 pill every 8 hours as needed  Tylenol in between Not controlling the pain well  Hard to get comfortable  Has f/u planned for monday  Patient Active Problem List   Diagnosis Date Noted  . Herpes zoster 11/22/2012  . Vaginal pain 11/22/2012  . Cystocele 09/22/2012  . Encounter for Medicare annual wellness exam 09/17/2012  . Acute bacterial sinusitis 09/10/2012  . Left knee pain 09/10/2012  . Thoracic back pain 06/01/2012  . Skin lesion of face 05/17/2012  . Other screening mammogram 07/22/2011  . Routine gynecological examination 07/22/2011  . Colon cancer screening 07/22/2011  . Screening for lipoid disorders 07/17/2011  . History of anemia 05/06/2011  . 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 Hypothyroid 02/24/2011  . Varicose veins 02/24/2011  . Elevated blood pressure 11/20/2010  . Obesity 11/20/2010  . 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 Other chronic sinusitis 07/03/2009  . THROAT PAIN, CHRONIC 07/03/2009  . GERD 12/20/2008  . Anxiety state, unspecified 08/22/2008  . MENOPAUSAL SYNDROME 07/13/2007  . HYPERCHOLESTEROLEMIA, PURE 07/08/2006  . ALLERGIC  RHINITIS 07/08/2006  . OVERACTIVE BLADDER 07/08/2006  . 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 DEPRESSION 06/25/2006  . PERIPHERAL VASCULAR DISEASE 06/25/2006  . OSTEOARTHRITIS 06/25/2006  . LEG EDEMA 06/25/2006  . SKIN CANCER, HX OF 06/25/2006  . COLONIC POLYPS, HX OF 06/25/2006   Past Medical History  Diagnosis Date  . Colon polyps   . Depression   . Hypothyroidism   . Osteoarthritis     hands  . Peripheral vascular disease   . Degenerative disk  disease     spine in [facility] in past   . Cancer     skin cancer- basal cell on arm / colon 2011  . Lymphoma   . Colon cancer   . 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 Cholecystectomy    . Splenectomy      lymphoma  . Breast biopsy  1996  . Plantar fascia release    . Ventral hernia repair  1998  . Tendon release      Right thumb   . Colectomy  8/11  . Vaginal hysterectomy     History  Substance Use Topics  . Smoking status: Never Smoker   . Smokeless tobacco: Not on file  . Alcohol Use: No   Family History  Problem Relation Age of Onset  . Coronary artery disease Mother   . Alcohol abuse Mother   . Cancer Father     jaw  . Diabetes Brother   . Fibromyalgia Daughter     chronic pain   . COPD Daughter   . Colon cancer Neg Hx   . Colon polyps Neg Hx   . Stomach cancer Neg Hx    Allergies  Allergen Reactions  . Achromycin [Tetracycline Hcl]     Pt does not remember reaction  . Allopurinol     REACTION: Unsure of reaction happene years ago  . Cephalexin     REACTION: unsure of reaction happened yrs ago.  Marland Kitchen  Ciprofloxacin     REACTION: rash  . Codeine     REACTION: abd. pain  . Meloxicam     REACTION: GI symptoms  . Minocycline     Abdominal pain  . Nabumetone     REACTION: reaction not known  . Nyquil [Pseudoeph-Doxylamine-Dm-Apap] Hives  . Penicillins     REACTION: reaction not known  . Sulfonamide Derivatives     Abdominal pain  . Zolpidem Tartrate     REACTION: feels too drugged  . Buspar [Buspirone Hcl]     Dizziness, and not as effective for anxiety   Current Outpatient Prescriptions on File Prior to Visit  Medication Sig Dispense Refill  . betamethasone dipropionate (DIPROLENE) 0.05 % cream Apply topically daily. To affected area  30 g  1  . Calcium Carbonate-Vitamin D (CALCIUM 600+D) 600-400 MG-UNIT per tablet Take 1 tablet by mouth daily.        . celecoxib (CELEBREX) 200 MG capsule Take 1  capsule (200 mg total) by mouth daily as needed for pain (with food).  90 capsule  3  . cholecalciferol (VITAMIN D) 1000 UNITS tablet Take 1,000 Units by mouth daily.        . cyclobenzaprine (FLEXERIL) 10 MG tablet Take 1 tablet (10 mg total) by mouth 3 (three) times daily as needed for muscle spasms.  30 tablet  3  . estradiol (ESTRACE) 2 MG tablet Take 1 tablet (2 mg total) by mouth 2 (two) times daily.  180 tablet  3  . fexofenadine (ALLEGRA) 180 MG tablet Take 1 tablet (180 mg total) by mouth daily.  90 tablet  3  . FLUoxetine (PROZAC) 20 MG capsule Take 2 capsules (40 mg total) by mouth 2 (two) times daily.  180 capsule  3  . fluticasone (FLONASE) 50 MCG/ACT nasal spray Place 2 sprays into the nose daily. 1 spray in each nostril twice daily  48 g  3  . levothyroxine (SYNTHROID, LEVOTHROID) 112 MCG tablet Take 1 tablet (112 mcg total) by mouth daily.  90 tablet  3  . NON FORMULARY once a week. ALLERGY SHOTS      . Omega-3 350 MG CAPS Take by mouth. Take once capsule by mouth once a day       . omeprazole (PRILOSEC) 20 MG capsule Take 1 capsule (20 mg total) by mouth 2 (two) times daily. Take one 30-60 min before first and last meals of the day as long as coughing at all, then once daily before breakfast  180 capsule  3  . PROSED/DS, ATROPINE FREE, 81.6 MG TABS Take 1 tablet (81.6 mg total) by mouth 4 (four) times daily.  120 each  5  . psyllium (METAMUCIL) 58.6 % packet Take 1 packet by mouth daily.      . simvastatin (ZOCOR) 40 MG tablet Take 1 tablet (40 mg total) by mouth daily.  90 tablet  3  . traMADol (ULTRAM) 50 MG tablet Take 1 tablet (50 mg total) by mouth every 8 (eight) hours as needed for pain.  60 tablet  0  . valACYclovir (VALTREX) 1000 MG tablet Take 1 tablet (1,000 mg total) by mouth 3 (three) times daily.  21 tablet  0  . vitamin E (VITAMIN E) 400 UNIT capsule Take 400 Units by mouth daily.         Current Facility-Administered Medications on File Prior to Visit  Medication  Dose Route Frequency Provider Last Rate Last Dose  . 0.9 %  sodium chloride infusion  500 mL Intravenous Continuous Louis Meckel, MD          Review of Systems Review of Systems  Constitutional: Negative for fever, appetite change, fatigue and unexpected weight change.  Eyes: Negative for pain and visual disturbance.  Respiratory: Negative for cough and shortness of breath.   Cardiovascular: Negative for cp or palpitations    Gastrointestinal: Negative for nausea, diarrhea and constipation.  Genitourinary: Negative for urgency and frequency.  Skin: Negative for pallor and pos for rash/ zoster with pain   Neurological: Negative for weakness, light-headedness, numbness and headaches.  Hematological: Negative for adenopathy. Does not bruise/bleed easily.  Psychiatric/Behavioral: Negative for dysphoric mood. The patient is not nervous/anxious.         Objective:   Physical Exam  Constitutional: She appears well-developed and well-nourished. No distress.  obese and well appearing   HENT:  Head: Normocephalic and atraumatic.  Neck: Normal range of motion. Neck supple.  Cardiovascular: Normal rate and regular rhythm.   Musculoskeletal: She exhibits tenderness.  Lymphadenopathy:    She has no cervical adenopathy.  Neurological: She is alert.  Skin: Skin is warm and dry. Rash noted.  Vesicular rash in patches over L buttock and upper thigh extending to L labia majora Overall looks to be healing but there are a few pustules (? Due to shingles or bacterial superinfection) Scant redness Swelling decreased   Psychiatric: She has a normal mood and affect.          Assessment & Plan:

## 2012-12-01 NOTE — Assessment & Plan Note (Signed)
This looks to be improving- but with a few pustules present will cover with bactrim for poss bacterial superinfection (pt will take with food to avoid GI upset) She will finish valtrex Keep rash clean and dry  If fever or worse-will call or seek care asap F/u Monday for a re check

## 2012-12-06 ENCOUNTER — Ambulatory Visit (INDEPENDENT_AMBULATORY_CARE_PROVIDER_SITE_OTHER): Payer: Medicare Other | Admitting: Family Medicine

## 2012-12-06 ENCOUNTER — Encounter: Payer: Self-pay | Admitting: Family Medicine

## 2012-12-06 VITALS — BP 136/72 | HR 96 | Temp 98.2°F | Ht 65.0 in | Wt 243.0 lb

## 2012-12-06 DIAGNOSIS — R102 Pelvic and perineal pain: Secondary | ICD-10-CM

## 2012-12-06 DIAGNOSIS — B029 Zoster without complications: Secondary | ICD-10-CM

## 2012-12-06 DIAGNOSIS — N949 Unspecified condition associated with female genital organs and menstrual cycle: Secondary | ICD-10-CM | POA: Diagnosis not present

## 2012-12-06 NOTE — Progress Notes (Signed)
Subjective:    Patient ID: Diamond Boyd, female    DOB: [birth year], 72 y.o.   MRN: [id number]  HPI Here for f/u of zoster   Has finished valtrex  Also given bactrim for some signs of bact infection - has it through wed   Tramadol for pain   She is hurting  Perhaps getting a bit better  Still oozes a bit   Keeping area clean with soap and water   Patient Active Problem List   Diagnosis Date Noted  . Herpes zoster 11/22/2012  . Vaginal pain 11/22/2012  . Cystocele 09/22/2012  . Encounter for Medicare annual wellness exam 09/17/2012  . Acute bacterial sinusitis 09/10/2012  . Left knee pain 09/10/2012  . Thoracic back pain 06/01/2012  . Skin lesion of face 05/17/2012  . Other screening mammogram 07/22/2011  . Routine gynecological examination 07/22/2011  . Colon cancer screening 07/22/2011  . Screening for lipoid disorders 07/17/2011  . History of anemia 05/06/2011  . 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 Hypothyroid 02/24/2011  . Varicose veins 02/24/2011  . Elevated blood pressure 11/20/2010  . Obesity 11/20/2010  . 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 Other chronic sinusitis 07/03/2009  . THROAT PAIN, CHRONIC 07/03/2009  . GERD 12/20/2008  . Anxiety state, unspecified 08/22/2008  . MENOPAUSAL SYNDROME 07/13/2007  . HYPERCHOLESTEROLEMIA, PURE 07/08/2006  . ALLERGIC  RHINITIS 07/08/2006  . OVERACTIVE BLADDER 07/08/2006  . 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 DEPRESSION 06/25/2006  . PERIPHERAL VASCULAR DISEASE 06/25/2006  . OSTEOARTHRITIS 06/25/2006  . LEG EDEMA 06/25/2006  . SKIN CANCER, HX OF 06/25/2006  . COLONIC POLYPS, HX OF 06/25/2006   Past Medical History  Diagnosis Date  . Colon polyps   . Depression   . Hypothyroidism   . Osteoarthritis     hands  . Peripheral vascular disease   . Degenerative disk disease     spine in [facility] in past   . Cancer     skin cancer- basal cell  on arm / colon 2011  . Lymphoma   . Colon cancer   . 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 Cholecystectomy    . Splenectomy      lymphoma  . Breast biopsy  1996  . Plantar fascia release    . Ventral hernia repair  1998  . Tendon release      Right thumb   . Colectomy  8/11  . Vaginal hysterectomy     History  Substance Use Topics  . Smoking status: Never Smoker   . Smokeless tobacco: Not on file  . Alcohol Use: No   Family History  Problem Relation Age of Onset  . Coronary artery disease Mother   . Alcohol abuse Mother   . Cancer Father     jaw  . Diabetes Brother   . Fibromyalgia Daughter     chronic pain   . COPD Daughter   . Colon cancer Neg Hx   . Colon polyps Neg Hx   . Stomach cancer Neg Hx    Allergies  Allergen Reactions  . Achromycin [Tetracycline Hcl]     Pt does not remember reaction  . Allopurinol     REACTION: Unsure of reaction happene years ago  . Cephalexin     REACTION: unsure of reaction happened yrs ago.  . Ciprofloxacin     REACTION: rash  . Codeine  REACTION: abd. pain  . Meloxicam     REACTION: GI symptoms  . Minocycline     Abdominal pain  . Nabumetone     REACTION: reaction not known  . Nyquil [Pseudoeph-Doxylamine-Dm-Apap] Hives  . Penicillins     REACTION: reaction not known  . Zolpidem Tartrate     REACTION: feels too drugged  . Buspar [Buspirone Hcl]     Dizziness, and not as effective for anxiety   Current Outpatient Prescriptions on File Prior to Visit  Medication Sig Dispense Refill  . betamethasone dipropionate (DIPROLENE) 0.05 % cream Apply topically daily. To affected area  30 g  1  . Calcium Carbonate-Vitamin D (CALCIUM 600+D) 600-400 MG-UNIT per tablet Take 1 tablet by mouth daily.        . celecoxib (CELEBREX) 200 MG capsule Take 1 capsule (200 mg total) by mouth daily as needed for pain (with food).  90 capsule  3  . cholecalciferol (VITAMIN D) 1000 UNITS  tablet Take 1,000 Units by mouth daily.        . cyclobenzaprine (FLEXERIL) 10 MG tablet Take 1 tablet (10 mg total) by mouth 3 (three) times daily as needed for muscle spasms.  30 tablet  3  . estradiol (ESTRACE) 2 MG tablet Take 1 tablet (2 mg total) by mouth 2 (two) times daily.  180 tablet  3  . fexofenadine (ALLEGRA) 180 MG tablet Take 1 tablet (180 mg total) by mouth daily.  90 tablet  3  . FLUoxetine (PROZAC) 20 MG capsule Take 2 capsules (40 mg total) by mouth 2 (two) times daily.  180 capsule  3  . fluticasone (FLONASE) 50 MCG/ACT nasal spray Place 2 sprays into the nose daily. 1 spray in each nostril twice daily  48 g  3  . levothyroxine (SYNTHROID, LEVOTHROID) 112 MCG tablet Take 1 tablet (112 mcg total) by mouth daily.  90 tablet  3  . NON FORMULARY once a week. ALLERGY SHOTS      . Omega-3 350 MG CAPS Take by mouth. Take once capsule by mouth once a day       . omeprazole (PRILOSEC) 20 MG capsule Take 1 capsule (20 mg total) by mouth 2 (two) times daily. Take one 30-60 min before first and last meals of the day as long as coughing at all, then once daily before breakfast  180 capsule  3  . PROSED/DS, ATROPINE FREE, 81.6 MG TABS Take 1 tablet (81.6 mg total) by mouth 4 (four) times daily.  120 each  5  . psyllium (METAMUCIL) 58.6 % packet Take 1 packet by mouth daily.      . simvastatin (ZOCOR) 40 MG tablet Take 1 tablet (40 mg total) by mouth daily.  90 tablet  3  . sulfamethoxazole-trimethoprim (BACTRIM DS) 800-160 MG per tablet Take 1 tablet by mouth 2 (two) times daily.  14 tablet  0  . traMADol (ULTRAM) 50 MG tablet Take 1-2 tablets (50-100 mg total) by mouth every 8 (eight) hours as needed for pain.  90 tablet  0  . valACYclovir (VALTREX) 1000 MG tablet Take 1 tablet (1,000 mg total) by mouth 3 (three) times daily.  21 tablet  0  . vitamin E (VITAMIN E) 400 UNIT capsule Take 400 Units by mouth daily.         Current Facility-Administered Medications on File Prior to Visit   Medication Dose Route Frequency Provider Last Rate Last Dose  . 0.9 %  sodium chloride infusion  500 mL Intravenous Continuous Louis Meckel, MD         Review of Systems Review of Systems  Constitutional: Negative for fever, appetite change, fatigue and unexpected weight change.  Eyes: Negative for pain and visual disturbance.  Respiratory: Negative for cough and shortness of breath.   Cardiovascular: Negative for cp or palpitations    Gastrointestinal: Negative for nausea, diarrhea and constipation.  Genitourinary: Negative for urgency and frequency.  Skin: Negative for pallor and pos for painful rash over L labia / buttocks and upper L thigh Neurological: Negative for weakness, light-headedness, numbness and headaches.  Hematological: Negative for adenopathy. Does not bruise/bleed easily.  Psychiatric/Behavioral: Negative for dysphoric mood. The patient is not nervous/anxious.         Objective:   Physical Exam  Constitutional: She appears well-developed and well-nourished. No distress.  obese and well appearing   Eyes: Conjunctivae and EOM are normal. Pupils are equal, round, and reactive to light. Right eye exhibits no discharge. Left eye exhibits no discharge.  Neck: Normal range of motion. Neck supple.  Pulmonary/Chest: Effort normal and breath sounds normal.  Musculoskeletal: She exhibits no edema.  Lymphadenopathy:    She has no cervical adenopathy.  Neurological: She is alert.  Skin:  Zoster rash over L2 dermatome incl L labia and buttock and top of thigh Some vesicles open/others are scabbed over No pustules or drainage Swelling of labia is much imp-no longer with white mucosal changes  Psychiatric: She has a normal mood and affect.          Assessment & Plan:

## 2012-12-06 NOTE — Patient Instructions (Signed)
Rash is looking better  Keep it clean and dry  Continue tramadol for pain as needed  finsh the bactrim  Follow up with me in 2-3 weeks to re check this  Stop the diprolene cream

## 2012-12-06 NOTE — Assessment & Plan Note (Signed)
Doubt lichen sclerosis at this time- looks more like zoster in L labia  Healing with scabs and much less swelling  Close f/u Finish bactrim

## 2012-12-06 NOTE — Assessment & Plan Note (Signed)
Slowly improving with course of valtrex and bactrim for poss superinfection Tramadol for pain  Rash is healing  Close f/u Disc hygeine and care

## 2012-12-15 ENCOUNTER — Ambulatory Visit: Payer: Medicare Other | Admitting: Family Medicine

## 2012-12-20 ENCOUNTER — Ambulatory Visit: Payer: Medicare Other | Admitting: Family Medicine

## 2013-01-04 ENCOUNTER — Ambulatory Visit (INDEPENDENT_AMBULATORY_CARE_PROVIDER_SITE_OTHER): Payer: Medicare Other | Admitting: Family Medicine

## 2013-01-04 ENCOUNTER — Encounter: Payer: Self-pay | Admitting: Family Medicine

## 2013-01-04 VITALS — BP 120/68 | HR 91 | Temp 98.0°F | Ht 66.0 in | Wt 237.0 lb

## 2013-01-04 DIAGNOSIS — B029 Zoster without complications: Secondary | ICD-10-CM | POA: Diagnosis not present

## 2013-01-04 DIAGNOSIS — R5381 Other malaise: Secondary | ICD-10-CM | POA: Diagnosis not present

## 2013-01-04 NOTE — Patient Instructions (Signed)
Labs for fatigue today  I'm glad shingles is getting better  - it looks a lot better  Keep the rash area clean as it heals  Follow up with me in 3 months Work on gradually weaning off hormones  Increase your activity gradually- try to do a little more each day

## 2013-01-04 NOTE — Progress Notes (Signed)
Pre-visit discussion using our clinic review tool. No additional management support is needed unless otherwise documented below in the visit note.  

## 2013-01-04 NOTE — Progress Notes (Signed)
Subjective:    Patient ID: Diamond Boyd, female    DOB: 03/08/1940, 72 y.o.   MRN: [id number]  HPI Here for f/u of shingles  The rash is improving - still numb funny feeling in her L leg and down to her foot  Pain is improved  She stopped her pain medicine - it did make her constipated   She is exhausted and weak in general   She is anxious and irritable -snapping at family   Wt is down 6 lb  No appetite for a while   Lab Results  Component Value Date   WBC 6.5 09/17/2012   HGB 13.1 09/17/2012   HCT 40.2 09/17/2012   MCV 95.3 09/17/2012   PLT 279.0 09/17/2012    Lab Results  Component Value Date   TSH 3.03 09/17/2012     Plans to start weaning off HRT -will not be able to afford it in Jan Is down to 1/2 tablet    Patient Active Problem List   Diagnosis Date Noted  . Herpes zoster 11/22/2012  . Vaginal pain 11/22/2012  . Cystocele 09/22/2012  . Encounter for Medicare annual wellness exam 09/17/2012  . Acute bacterial sinusitis 09/10/2012  . Left knee pain 09/10/2012  . Thoracic back pain 06/01/2012  . Skin lesion of face 05/17/2012  . Other screening mammogram 07/22/2011  . Routine gynecological examination 07/22/2011  . Colon cancer screening 07/22/2011  . Screening for lipoid disorders 07/17/2011  . History of anemia 05/06/2011  . 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 Hypothyroid 02/24/2011  . Varicose veins 02/24/2011  . Elevated blood pressure 11/20/2010  . Obesity 11/20/2010  . 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 Other chronic sinusitis 07/03/2009  . THROAT PAIN, CHRONIC 07/03/2009  . GERD 12/20/2008  . Anxiety state, unspecified 08/22/2008  . MENOPAUSAL SYNDROME 07/13/2007  . HYPERCHOLESTEROLEMIA, PURE 07/08/2006  . ALLERGIC  RHINITIS 07/08/2006  . OVERACTIVE BLADDER 07/08/2006  . 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 DEPRESSION 06/25/2006  . PERIPHERAL VASCULAR DISEASE 06/25/2006  .  OSTEOARTHRITIS 06/25/2006  . LEG EDEMA 06/25/2006  . SKIN CANCER, HX OF 06/25/2006  . COLONIC POLYPS, HX OF 06/25/2006   Past Medical History  Diagnosis Date  . Colon polyps   . Depression   . Hypothyroidism   . Osteoarthritis     hands  . Peripheral vascular disease   . Degenerative disk disease     spine in center in past   . Cancer     skin cancer- basal cell on arm / colon 2011  . Lymphoma   . Colon cancer   . 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 Cholecystectomy    . Splenectomy      lymphoma  . Breast biopsy  1996  . Plantar fascia release    . Ventral hernia repair  1998  . Tendon release      Right thumb   . Colectomy  8/11  . Vaginal hysterectomy     History  Substance Use Topics  . Smoking status: Never Smoker   . Smokeless tobacco: Not on file  . Alcohol Use: No   Family History  Problem Relation Age of Onset  . Coronary artery disease Mother   . Alcohol abuse Mother   . Cancer Father     jaw  . Diabetes Brother   . Fibromyalgia Daughter     chronic  pain   . COPD Daughter   . Colon cancer Neg Hx   . Colon polyps Neg Hx   . Stomach cancer Neg Hx    Allergies  Allergen Reactions  . Achromycin [Tetracycline Hcl]     Pt does not remember reaction  . Allopurinol     REACTION: Unsure of reaction happene years ago  . Cephalexin     REACTION: unsure of reaction happened yrs ago.  . Ciprofloxacin     REACTION: rash  . Codeine     REACTION: abd. pain  . Meloxicam     REACTION: GI symptoms  . Minocycline     Abdominal pain  . Nabumetone     REACTION: reaction not known  . Nyquil [Pseudoeph-Doxylamine-Dm-Apap] Hives  . Penicillins     REACTION: reaction not known  . Zolpidem Tartrate     REACTION: feels too drugged  . Buspar [Buspirone Hcl]     Dizziness, and not as effective for anxiety   Current Outpatient Prescriptions on File Prior to Visit  Medication Sig Dispense Refill  . Calcium  Carbonate-Vitamin D (CALCIUM 600+D) 600-400 MG-UNIT per tablet Take 1 tablet by mouth daily.        . celecoxib (CELEBREX) 200 MG capsule Take 1 capsule (200 mg total) by mouth daily as needed for pain (with food).  90 capsule  3  . cholecalciferol (VITAMIN D) 1000 UNITS tablet Take 1,000 Units by mouth daily.        . cyclobenzaprine (FLEXERIL) 10 MG tablet Take 1 tablet (10 mg total) by mouth 3 (three) times daily as needed for muscle spasms.  30 tablet  3  . estradiol (ESTRACE) 2 MG tablet Take 1 tablet (2 mg total) by mouth 2 (two) times daily.  180 tablet  3  . fexofenadine (ALLEGRA) 180 MG tablet Take 1 tablet (180 mg total) by mouth daily.  90 tablet  3  . FLUoxetine (PROZAC) 20 MG capsule Take 2 capsules (40 mg total) by mouth 2 (two) times daily.  180 capsule  3  . fluticasone (FLONASE) 50 MCG/ACT nasal spray Place 2 sprays into the nose daily. 1 spray in each nostril twice daily  48 g  3  . levothyroxine (SYNTHROID, LEVOTHROID) 112 MCG tablet Take 1 tablet (112 mcg total) by mouth daily.  90 tablet  3  . NON FORMULARY once a week. ALLERGY SHOTS      . Omega-3 350 MG CAPS Take by mouth. Take once capsule by mouth once a day       . omeprazole (PRILOSEC) 20 MG capsule Take 1 capsule (20 mg total) by mouth 2 (two) times daily. Take one 30-60 min before first and last meals of the day as long as coughing at all, then once daily before breakfast  180 capsule  3  . PROSED/DS, ATROPINE FREE, 81.6 MG TABS Take 1 tablet (81.6 mg total) by mouth 4 (four) times daily.  120 each  5  . psyllium (METAMUCIL) 58.6 % packet Take 1 packet by mouth daily.      . simvastatin (ZOCOR) 40 MG tablet Take 1 tablet (40 mg total) by mouth daily.  90 tablet  3  . vitamin E (VITAMIN E) 400 UNIT capsule Take 400 Units by mouth daily.         Current Facility-Administered Medications on File Prior to Visit  Medication Dose Route Frequency Provider Last Rate Last Dose  . 0.9 %  sodium chloride infusion  500 mL  Intravenous Continuous Louis Meckel, MD        Review of Systems Review of Systems  Constitutional: Negative for fever, appetite change, and unexpected weight change.  Eyes: Negative for pain and visual disturbance.  Respiratory: Negative for cough and shortness of breath.   Cardiovascular: Negative for cp or palpitations    Gastrointestinal: Negative for nausea, diarrhea and constipation.  Genitourinary: Negative for urgency and frequency.  Skin: Negative for pallor or rash   Neurological: Negative for weakness, light-headedness, and headaches. pos for numbness/ tingling at area where shingles was  Hematological: Negative for adenopathy. Does not bruise/bleed easily.  Psychiatric/Behavioral:pos for dysphoric mood/ anx and irritability without SI        Objective:   Physical Exam  Constitutional: She appears well-developed and well-nourished. No distress.  obese and well appearing   HENT:  Head: Normocephalic and atraumatic.  Eyes: Conjunctivae and EOM are normal. Pupils are equal, round, and reactive to light. No scleral icterus.  Neck: Normal range of motion. Neck supple.  Cardiovascular: Normal rate and regular rhythm.   Pulmonary/Chest: Effort normal and breath sounds normal.  Lymphadenopathy:    She has no cervical adenopathy.  Skin: Skin is warm and dry. Rash noted.  Shingles rash on L buttock /leg and labia is much improved- drying up with hyperpigmentation 2 areas of debridement are healing well and clear appearing Swelling is resolved   Psychiatric: Her mood appears anxious. Her affect is not blunt and not labile.          Assessment & Plan:

## 2013-01-05 LAB — CBC WITH DIFFERENTIAL/PLATELET
Basophils Relative: 1.1 % (ref 0.0–3.0)
Hemoglobin: 12.9 g/dL (ref 12.0–15.0)
Lymphocytes Relative: 33.8 % (ref 12.0–46.0)
Lymphs Abs: 2.7 10*3/uL (ref 0.7–4.0)
MCV: 92 fl (ref 78.0–100.0)
Monocytes Absolute: 1.6 10*3/uL — ABNORMAL HIGH (ref 0.1–1.0)
Monocytes Relative: 19.4 % — ABNORMAL HIGH (ref 3.0–12.0)
Neutro Abs: 3.6 10*3/uL (ref 1.4–7.7)
Platelets: 271 10*3/uL (ref 150.0–400.0)
RBC: 4.19 Mil/uL (ref 3.87–5.11)

## 2013-01-05 NOTE — Assessment & Plan Note (Signed)
Clinically improved Rash healing  Symptoms incl numb/tingling more than pain  Continue to follow

## 2013-01-05 NOTE — Assessment & Plan Note (Signed)
Suspect due to recent shingles Disc inc her activity very gradually  Cbc and tsh today Hx of asplenia and lymphoma in past  Also disc depression-may need inc in prozac if no imp

## 2013-01-07 ENCOUNTER — Encounter: Payer: Self-pay | Admitting: *Deleted

## 2013-01-13 ENCOUNTER — Ambulatory Visit (HOSPITAL_BASED_OUTPATIENT_CLINIC_OR_DEPARTMENT_OTHER): Payer: Medicare Other | Admitting: Internal Medicine

## 2013-01-13 ENCOUNTER — Telehealth: Payer: Self-pay | Admitting: Internal Medicine

## 2013-01-13 ENCOUNTER — Other Ambulatory Visit (HOSPITAL_BASED_OUTPATIENT_CLINIC_OR_DEPARTMENT_OTHER): Payer: Medicare Other | Admitting: Lab

## 2013-01-13 ENCOUNTER — Other Ambulatory Visit: Payer: Self-pay | Admitting: Internal Medicine

## 2013-01-13 VITALS — BP 152/77 | HR 100 | Temp 97.2°F | Resp 20 | Ht 66.0 in | Wt 237.6 lb

## 2013-01-13 DIAGNOSIS — C184 Malignant neoplasm of transverse colon: Secondary | ICD-10-CM

## 2013-01-13 DIAGNOSIS — C8587 Other specified types of non-Hodgkin lymphoma, spleen: Secondary | ICD-10-CM | POA: Diagnosis not present

## 2013-01-13 DIAGNOSIS — G4700 Insomnia, unspecified: Secondary | ICD-10-CM | POA: Diagnosis not present

## 2013-01-13 DIAGNOSIS — C8589 Other specified types of non-Hodgkin lymphoma, extranodal and solid organ sites: Secondary | ICD-10-CM | POA: Diagnosis not present

## 2013-01-13 DIAGNOSIS — Z862 Personal history of diseases of the blood and blood-forming organs and certain disorders involving the immune mechanism: Secondary | ICD-10-CM

## 2013-01-13 LAB — COMPREHENSIVE METABOLIC PANEL (CC13)
Albumin: 3.8 g/dL (ref 3.5–5.0)
Alkaline Phosphatase: 129 U/L (ref 40–150)
BUN: 11.5 mg/dL (ref 7.0–26.0)
CO2: 28 mEq/L (ref 22–29)
Calcium: 9.6 mg/dL (ref 8.4–10.4)
Glucose: 150 mg/dl — ABNORMAL HIGH (ref 70–140)
Potassium: 3.9 mEq/L (ref 3.5–5.1)
Sodium: 142 mEq/L (ref 136–145)
Total Protein: 6.4 g/dL (ref 6.4–8.3)

## 2013-01-13 LAB — CBC WITH DIFFERENTIAL/PLATELET
Basophils Absolute: 0.1 10*3/uL (ref 0.0–0.1)
Eosinophils Absolute: 0.1 10*3/uL (ref 0.0–0.5)
HCT: 40.6 % (ref 34.8–46.6)
MONO#: 1.3 10*3/uL — ABNORMAL HIGH (ref 0.1–0.9)
NEUT#: 2.7 10*3/uL (ref 1.5–6.5)
NEUT%: 45.1 % (ref 38.4–76.8)
RBC: 4.28 10*6/uL (ref 3.70–5.45)
RDW: 16.3 % — ABNORMAL HIGH (ref 11.2–14.5)
WBC: 6 10*3/uL (ref 3.9–10.3)
lymph#: 1.9 10*3/uL (ref 0.9–3.3)

## 2013-01-13 LAB — IRON AND TIBC CHCC
%SAT: 19 % — ABNORMAL LOW (ref 21–57)
Iron: 61 ug/dL (ref 41–142)
UIBC: 268 ug/dL (ref 120–384)

## 2013-01-13 LAB — LACTATE DEHYDROGENASE (CC13): LDH: 211 U/L (ref 125–245)

## 2013-01-13 LAB — FERRITIN CHCC: Ferritin: 189 ng/ml (ref 9–269)

## 2013-01-13 NOTE — Progress Notes (Signed)
[facility] OFFICE PROGRESS NOTE  Diamond Manns, MD [address] [place] [address]  DIAGNOSIS: LYMPHOMA NEC, MLIG, SPLEEN - Plan: Lactate dehydrogenase, CT Abdomen Pelvis W Contrast, CT Soft Tissue Neck W Contrast  Malignant neoplasm of transverse colon - Plan: CBC with Differential, Comprehensive metabolic panel, CEA, CT Abdomen Pelvis W Contrast, DG Chest 2 View  History of anemia  Chief Complaint  Patient presents with  . LYMPHOMA NEC, MLIG, SPLEEN   CURRENT THERAPY: Surveillance.   INTERVAL HISTORY: Diamond Boyd 72 y.o. 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  She was last seen by Dr. Arline Asp on 07/14/2012.  She is accompanied by her husband Diamond Boyd.  She reports frequently getting up throughout the night.  She relates this in part to her pain located on her left buttocks from shingles she had a few weeks prior.  She also reports increasing concern about her left oropharynx feeling as though something is "collecting " and she can not fully retrieve it.  She denies alcohol use.  She reports a stable mood.  She admits frequently napping.    She underwent a CT scan of the abdomen and pelvis with IV contrast on 01/21/2012. There was no significant adenopathy and no evidence for recurrent colon cancer. 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 Over the past 6 months, Diamond Boyd condition has remained stable. She  denies any medical problems. She denies specifically any pain, difficulty eating, trouble with her bowels, blood in the stool, fever, chills, night sweats, or any sense of ill health. She is without complaints today.  MEDICAL HISTORY: Past  Medical History  Diagnosis Date  . Colon polyps   . Depression   . Hypothyroidism   . Osteoarthritis     hands  . Peripheral vascular disease   . Degenerative disk disease     spine in center in past   . Cancer     skin cancer- basal cell on arm / colon 2011  . Lymphoma   . Colon cancer   . 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    INTERIM HISTORY: has MALIGNANT NEOPLASM OF TRANSVERSE COLON; LYMPHOMA NEC, MLIG, SPLEEN; HYPERCHOLESTEROLEMIA, PURE; DISORDERS OF PHOSPHORUS METABOLISM; Anxiety state, unspecified; DEPRESSION; PERIPHERAL VASCULAR DISEASE; Other chronic sinusitis; ALLERGIC  RHINITIS; GERD; OVERACTIVE BLADDER; MENOPAUSAL SYNDROME; OSTEOARTHRITIS; LEG EDEMA; THROAT PAIN, CHRONIC; SKIN CANCER, HX OF; COLONIC POLYPS, HX OF; Elevated blood pressure; Obesity; Hypothyroid; Varicose veins; Other asplenic status; History of anemia; Screening for lipoid disorders; Other screening mammogram; Routine gynecological examination; Colon cancer screening; Skin lesion of face; Thoracic back pain; Acute bacterial sinusitis; Left knee pain; Encounter for Medicare annual wellness exam; Cystocele; Herpes zoster; Vaginal pain; and Other malaise and fatigue on her problem list.    ALLERGIES:  is allergic to achromycin; allopurinol; cephalexin; ciprofloxacin; codeine; meloxicam; minocycline; nabumetone; nyquil; penicillins; zolpidem tartrate; and buspar.  MEDICATIONS: has a current medication list which includes the following prescription(s): calcium carbonate-vitamin d, celecoxib, cholecalciferol, cyclobenzaprine, estradiol, fexofenadine, fluoxetine, fluticasone, levothyroxine, NON FORMULARY, omega-3, omeprazole, prosed/ds (atropine free), psyllium, simvastatin, and vitamin e, and the following Facility-Administered Medications: sodium chloride.  SURGICAL HISTORY:  Past Surgical History  Procedure Laterality  Date  . Cholecystectomy    . Splenectomy      lymphoma  . Breast biopsy  1996  . Plantar fascia  release    . Ventral hernia repair  1998  . Tendon release      Right thumb   . Colectomy  8/11  . Vaginal hysterectomy     PROBLEM LIST:  1. Adenocarcinoma of the colon with 2 synchronous primaries dating back to August 2011 when the patient was found to have anemia. Both tumors were associated with negative lymph nodes. One tumor was T2 N0. The other tumor was T3 N0. The T2 N0 stage I tumor was in the sigmoid colon. The T3 N0 stage II tumor was in the transverse colon. The smaller tumor measured 1.5 cm. The larger tumor measured 6.5 cm. Surgery was carried out on 09/28/2009. 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  2. History of marginal zone lymphoma probably dating back to 53. The patient had bone marrow involvement and splenomegaly at that time, also a hemolytic anemia. She underwent a splenectomy on  November 12, 1994. Through the years, the patient has received Rituxan most recently, dating back to May 2006. The patient received fludarabine in late 2002 and early 2003. She may have developed a pneumonitis from the fludarabine in December 2002. The patient has had involvement of the peripheral blood with villous lymphocytes. She appears to be in a state of complete clinical remission at this time. Tumor is CD5 and CD20 positive, also CD11c and FMC7 positive.  3. 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  4. History of lupus anticoagulant dating back to the 1990s.  5. History of iron deficiency anemia for which the patient received IV Feraheme in January 2012. Presumably, this occurred as a result of her colon cancer.  6. History of GERD resulting in cough diagnosed around 2010.  7. Dyslipidemia.  8. Hypothyroidism.  9. Irritable bowel syndrome.  10. Goiter seen on PET scan from 09/04/2009.  11. Enlarged left tonsil diagnosed April 2011, currently resolved.  12. History of depression.   REVIEW OF SYSTEMS:   Constitutional:  Denies fevers, chills or abnormal weight loss Eyes: Denies blurriness of vision Ears, nose, mouth, throat, and face: Denies mucositis or sore throat Respiratory: Denies cough, dyspnea or wheezes Cardiovascular: Denies palpitation, chest discomfort or lower extremity swelling Gastrointestinal:  Denies nausea, heartburn or change in bowel habits Skin: Denies abnormal skin rashes Lymphatics: Denies new lymphadenopathy or easy bruising Neurological:Denies numbness, tingling or new weaknesses Behavioral/Psych: Mood is stable, no new changes  All other systems were reviewed with the patient and are negative.  PHYSICAL EXAMINATION: ECOG PERFORMANCE STATUS: 0 - Asymptomatic  Blood pressure 152/77, pulse 100, temperature 97.2 F (36.2 C), temperature source Oral, resp. rate 20, height 5\' 6"  (1.676 m), weight 237 lb 9.6 oz (107.775 kg), last menstrual period 02/10/1970.  GENERAL:alert, no distress and comfortable; moderately obese.  SKIN: skin color, texture, turgor are normal, no rashes or significant lesions EYES: normal, Conjunctiva are pink and non-injected, sclera clear OROPHARYNX:no exudate, no erythema and lips, buccal mucosa, and tongue normal  NECK: supple, thyroid normal size, non-tender, without nodularity.  Left neck adenopathy.   LYMPH:  no palpable lymphadenopathy in the cervical, axillary or supraclavicular LUNGS: clear to auscultation and percussion with normal breathing effort HEART: regular rate & rhythm and no murmurs and no lower extremity edema ABDOMEN:abdomen soft, non-tender and normal bowel sounds; multiple  surgical scars well healed.   Musculoskeletal:no cyanosis of digits and no clubbing  NEURO: alert & oriented x 3 with fluent speech, no focal motor/sensory deficits  LABORATORY DATA: Results for orders placed in visit on 01/13/13 (from the past 48 hour(s))  CBC WITH DIFFERENTIAL     Status: Abnormal   Collection Time    01/13/13  9:40 AM      Result Value Range    WBC 6.0  3.9 - 10.3 10e3/uL   NEUT# 2.7  1.5 - 6.5 10e3/uL   HGB 13.3  11.6 - 15.9 g/dL   HCT 16.1  09.6 - 04.5 %   Platelets 234  145 - 400 10e3/uL   MCV 94.8  79.5 - 101.0 fL   MCH 31.0  25.1 - 34.0 pg   MCHC 32.7  31.5 - 36.0 g/dL   RBC 4.09  8.11 - 9.14 10e6/uL   RDW 16.3 (*) 11.2 - 14.5 %   lymph# 1.9  0.9 - 3.3 10e3/uL   MONO# 1.3 (*) 0.1 - 0.9 10e3/uL   Eosinophils Absolute 0.1  0.0 - 0.5 10e3/uL   Basophils Absolute 0.1  0.0 - 0.1 10e3/uL   NEUT% 45.1  38.4 - 76.8 %   LYMPH% 31.0  14.0 - 49.7 %   MONO% 21.6 (*) 0.0 - 14.0 %   EOS% 1.2  0.0 - 7.0 %   BASO% 1.1  0.0 - 2.0 %  LACTATE DEHYDROGENASE (CC13)     Status: None   Collection Time    01/13/13  9:40 AM      Result Value Range   LDH 211  125 - 245 U/L  COMPREHENSIVE METABOLIC PANEL (CC13)     Status: Abnormal   Collection Time    01/13/13  9:40 AM      Result Value Range   Sodium 142  136 - 145 mEq/L   Potassium 3.9  3.5 - 5.1 mEq/L   Chloride 105  98 - 109 mEq/L   CO2 28  22 - 29 mEq/L   Glucose 150 (*) 70 - 140 mg/dl   BUN 78.2  7.0 - 95.6 mg/dL   Creatinine 0.8  0.6 - 1.1 mg/dL   Total Bilirubin 2.13  0.20 - 1.20 mg/dL   Alkaline Phosphatase 129  40 - 150 U/L   AST 27  5 - 34 U/L   ALT 20  0 - 55 U/L   Total Protein 6.4  6.4 - 8.3 g/dL   Albumin 3.8  3.5 - 5.0 g/dL   Calcium 9.6  8.4 - 08.6 mg/dL   Anion Gap 10  3 - 11 mEq/L     Labs:  Lab Results  Component Value Date   WBC 6.0 01/13/2013   HGB 13.3 01/13/2013   HCT 40.6 01/13/2013   MCV 94.8 01/13/2013   PLT 234 01/13/2013   NEUTROABS 2.7 01/13/2013      Chemistry      Component Value Date/Time   NA 142 01/13/2013 0940   NA 140 09/17/2012 1323   K 3.9 01/13/2013 0940   K 4.3 09/17/2012 1323   CL 100 09/17/2012 1323   CL 105 07/14/2012 0939   CO2 28 01/13/2013 0940   CO2 30 09/17/2012 1323   BUN 11.5 01/13/2013 0940   BUN 14 09/17/2012 1323   CREATININE 0.8 01/13/2013 0940   CREATININE 0.8 09/17/2012 1323      Component Value Date/Time   CALCIUM 9.6  01/13/2013 0940   CALCIUM 9.5  09/17/2012 1323   CALCIUM 9.3 08/09/2009 0000   ALKPHOS 129 01/13/2013 0940   ALKPHOS 82 09/17/2012 1323   AST 27 01/13/2013 0940   AST 23 09/17/2012 1323   ALT 20 01/13/2013 0940   ALT 21 09/17/2012 1323   BILITOT 0.34 01/13/2013 0940   BILITOT 0.8 09/17/2012 1323     CBC:  Recent Labs Lab 01/13/13 0940  WBC 6.0  NEUTROABS 2.7  HGB 13.3  HCT 40.6  MCV 94.8  PLT 234   IMAGING STUDIES:  1. Digital screening mammogram from 08/05/2010 was negative.  2. Chest x-Diamond Boyd, 2 view, from 12/09/2010 was negative.  3. Digital screening mammogram on 08/20/2011 showed no specific evidence of malignancy  4. Chest x-Diamond Boyd, 2 view, from 11/19/2011 showed atherosclerotic calcifications within the arch of the aorta. There was evidence of a prior cholecystectomy and splenectomy, otherwise chest x-Diamond Boyd was  negative.  5. CT scan of abdomen and pelvis with IV contrast obtained on 01/21/2012 showed no clear evidence of colon cancer metastasis. A retrocrural lymph node had increased in size but periportal adenopathy and periaortic adenopathy was stable to decreased. The retrocrural lymph node refer to measures 8 mm on image 12, increased from 5 mm on a prior study.  6. Chest x-Diamond Boyd, 2 view, from 06/01/2012 showed no acute abnormalities. There was borderline enlargement of the cardiac silhouette.   PROCEDURES:  Colonoscopy was carried out by Dr. Melvia Heaps on 10/10/2010.  ASSESSMENT: Diamond Boyd 72 y.o. female with a history of LYMPHOMA NEC, MLIG, SPLEEN - Plan: Lactate dehydrogenase, CT Abdomen Pelvis W Contrast, CT Soft Tissue Neck W Contrast  Malignant neoplasm of transverse colon - Plan: CBC with Differential, Comprehensive metabolic panel, CEA, CT Abdomen Pelvis W Contrast, DG Chest 2 View  History of anemia   PLAN:  1. Marginal zone lymphoma.  --Her marginal zone lymphoma continues to be quiescent even though one of the lymph nodes  removed at the time of colon cancer  surgery in August 2011 was found to have B-cell non-Hodgkin lymphoma. Cele is on no treatment and in fact, has not required any therapy since May 2006 at which time she received Rituxan.   --We will obtain CT of neck, abdomen and pelvis and CXR on 01/20/2013 given her symptoms of swelling within her left oropharynx that bothers her.  We will continue to the trend her LDH.  She denies any additional constitutional symptoms such as fever or chills or weight lost.   2. Colon cancer.   --Diamond Boyd continues to do well with no evidence of recurrent colon cancer over 3 years from the time of  diagnosis in August 2011.I should mention that Diamond Boyd last colonoscopy took place on 10/10/2010.  Diamond Boyd tells me that her next colonoscopy will be in 2015. CEA pending.  --As noted above, we will check a CT of abdomen and pelvis.   3. 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 Provided a handout about lyrica which might help.  She will discuss with her PCP.   4. Insomnia.  --Counseled patient on good sleep hygiene including avoiding frequent naps, caffeinated products in late afternoon.   Pain control for #3 if it continues to affect her sleep quality.   5. Follow-up.  --We will plan to see Diamond Boyd again in 6 months at which time we will check CBC, chemistries, CEA. Scans as noted above.   All questions were answered. The patient knows to call the clinic with any problems, questions or  concerns. We can certainly see the patient much sooner if necessary.  I spent 15 minutes counseling the patient face to face. The total time spent in the appointment was 25 minutes.    Zanai Mallari, MD 01/13/2013 10:59 AM

## 2013-01-13 NOTE — Patient Instructions (Addendum)
Postherpetic Neuralgia Shingles is a painful disease. It is caused by the herpes zoster virus. This is the same virus which also causes chickenpox. It can affect the torso, limbs, or the face. For most people, shingles is a condition of rather sudden onset. Pain usually lasts about 1 month. In older patients, or patients with poor immune systems, a painful, long-standing (chronic) condition called postherpetic neuralgia can develop. This condition rarely happens before age 6. But at least 50% of people over 50 become affected following an attack of shingles. There is a natural tendency for this condition to improve over time with no treatment. Less than 5% of patients have pain that lasts for more than 1 year. DIAGNOSIS  Herpes is usually easily diagnosed on physical exam. Pain sometimes follows when the skin sores (lesions) have disappeared. It is called postherpetic neuralgia. That name simply means the pain that follows herpes. TREATMENT   Treating this condition may be difficult. Usually one of the tricyclic antidepressants, often amitriptyline, is the first line of treatment. There is evidence that the sooner these medications are given, the more likely they are to reduce pain.  Conventional analgesics, regional nerve blocks, and anticonvulsants have little benefit in most cases when used alone. Other tricyclic anti-depressants are used as a second option if the first antidepressant is unsuccessful.  Anticonvulsants, including carbamazepine, have been found to provide some added benefit when used with a tricyclic anti-depressant. This is especially for the stabbing type of pain similar to that of trigeminal neuralgia.  Chronic opioid therapy. This is a strong narcotic pain medication. It is used to treat pain that is resistant to other measures. The issues of dependency and tolerance can be reduced with closely managed care.  Some cream treatments are applied locally to the affected area. They  can help when used with other treatments. Their use may be difficult in the case of postherpetic trigeminal neuralgia. This is involved with the face. So the substances can irritate the eye and the skin around the eye. Examples of creams used include Capsaicin and lidocaine creams.  For shingles, antiviral therapies along with analgesics are recommended. Studies of the effect of anti-viral agents such as acyclovir on shingles have been done. They show improved rates of healing and decreased severity of sudden (acute) pain. Some observations suggest that nerve blocks during shingles infection will:  Reduce pain.  Shorten the acute episode.  Prevent the emergence of postherpetic neuralgia. Viral medications used include Acyclovir (Zovirax), Valacyclovir, Famciclovir and a lysine diet. Document Released: 04/19/2002 Document Revised: 04/21/2011 Document Reviewed: 01/27/2005 [facility] Patient Information 2014 [place], [place]. Shingles Shingles (herpes zoster) is an infection that is caused by the same virus that causes chickenpox (varicella). The infection causes a painful skin rash and fluid-filled blisters, which eventually break open, crust over, and heal. It may occur in any area of the body, but it usually affects only one side of the body or face. The pain of shingles usually lasts about 1 month. However, some people with shingles may develop long-term (chronic) pain in the affected area of the body. Shingles often occurs many years after the person had chickenpox. It is more common:  In people older than 50 years.  In people with weakened immune systems, such as those with HIV, AIDS, or cancer.  In people taking medicines that weaken the immune system, such as transplant medicines.  In people under great stress. CAUSES  Shingles is caused by the varicella zoster virus (VZV), which also causes chickenpox.  After a person is infected with the virus, it can remain in the person's body for years  in an inactive state (dormant). To cause shingles, the virus reactivates and breaks out as an infection in a nerve root. The virus can be spread from person to person (contagious) through contact with open blisters of the shingles rash. It will only spread to people who have not had chickenpox. When these people are exposed to the virus, they may develop chickenpox. They will not develop shingles. Once the blisters scab over, the person is no longer contagious and cannot spread the virus to others. SYMPTOMS  Shingles shows up in stages. The initial symptoms may be pain, itching, and tingling in an area of the skin. This pain is usually described as burning, stabbing, or throbbing.In a few days or weeks, a painful red rash will appear in the area where the pain, itching, and tingling were felt. The rash is usually on one side of the body in a band or belt-like pattern. Then, the rash usually turns into fluid-filled blisters. They will scab over and dry up in approximately 2 3 weeks. Flu-like symptoms may also occur with the initial symptoms, the rash, or the blisters. These may include:  Fever.  Chills.  Headache.  Upset stomach. DIAGNOSIS  Your caregiver will perform a skin exam to diagnose shingles. Skin scrapings or fluid samples may also be taken from the blisters. This sample will be examined under a microscope or sent to a lab for further testing. TREATMENT  There is no specific cure for shingles. Your caregiver will likely prescribe medicines to help you manage the pain, recover faster, and avoid long-term problems. This may include antiviral drugs, anti-inflammatory drugs, and pain medicines. HOME CARE INSTRUCTIONS   Take a cool bath or apply cool compresses to the area of the rash or blisters as directed. This may help with the pain and itching.   Only take over-the-counter or prescription medicines as directed by your caregiver.   Rest as directed by your caregiver.  Keep your  rash and blisters clean with mild soap and cool water or as directed by your caregiver.  Do not pick your blisters or scratch your rash. Apply an anti-itch cream or numbing creams to the affected area as directed by your caregiver.  Keep your shingles rash covered with a loose bandage (dressing).  Avoid skin contact with:  Babies.   Pregnant women.   Children with eczema.   Elderly people with transplants.   People with chronic illnesses, such as leukemia or AIDS.   Wear loose-fitting clothing to help ease the pain of material rubbing against the rash.  Keep all follow-up appointments with your caregiver.If the area involved is on your face, you may receive a referral for follow-up to a specialist, such as an eye doctor (ophthalmologist) or an ear, nose, and throat (ENT) doctor. Keeping all follow-up appointments will help you avoid eye complications, chronic pain, or disability.  SEEK IMMEDIATE MEDICAL CARE IF:   You have facial pain, pain around the eye area, or loss of feeling on one side of your face.  You have ear pain or ringing in your ear.  You have loss of taste.  Your pain is not relieved with prescribed medicines.   Your redness or swelling spreads.   You have more pain and swelling.  Your condition is worsening or has changed.   You have a feveror persistent symptoms for more than 2 3 days.  You have  a fever and your symptoms suddenly get worse. MAKE SURE YOU:  Understand these instructions.  Will watch your condition.  Will get help right away if you are not doing well or get worse. Document Released: 01/27/2005 Document Revised: 10/22/2011 Document Reviewed: 09/11/2011 [facility] Patient Information 2014 [place], [place]. Pregabalin capsules What is this medicine? PREGABALIN (pre GAB a lin) is used to treat nerve pain from diabetes, shingles, spinal cord injury, and fibromyalgia. It is also used to control seizures in epilepsy. This medicine  may be used for other purposes; ask your health care provider or pharmacist if you have questions. COMMON BRAND NAME(S): Lyrica What should I tell my health care provider before I take this medicine? They need to know if you have any of these conditions: -bleeding problems -heart disease, including heart failure -history of alcohol or drug abuse -kidney disease -suicidal thoughts, plans, or attempt; a previous suicide attempt by you or a family member -an unusual or allergic reaction to pregabalin, gabapentin, other medicines, foods, dyes, or preservatives -pregnant or trying to get pregnant or trying to conceive with your partner -breast-feeding How should I use this medicine? Take this medicine by mouth with a glass of water. Follow the directions on the prescription label. You can take this medicine with or without food. Take your doses at regular intervals. Do not take your medicine more often than directed. Do not stop taking except on your doctor's advice. A special MedGuide will be given to you by the pharmacist with each prescription and refill. Be sure to read this information carefully each time. Talk to your pediatrician regarding the use of this medicine in children. Special care may be needed. Overdosage: If you think you have taken too much of this medicine contact a poison control center or emergency room at once. NOTE: This medicine is only for you. Do not share this medicine with others. What if I miss a dose? If you miss a dose, take it as soon as you can. If it is almost time for your next dose, take only that dose. Do not take double or extra doses. What may interact with this medicine? -alcohol -certain medicines for blood pressure like captopril, enalapril, or lisinopril -certain medicines for diabetes, like pioglitazone or rosiglitazone -certain medicines for anxiety or sleep -narcotic medicines for pain This list may not describe all possible interactions. Give your  health care provider a list of all the medicines, herbs, non-prescription drugs, or dietary supplements you use. Also tell them if you smoke, drink alcohol, or use illegal drugs. Some items may interact with your medicine. What should I watch for while using this medicine? Tell your doctor or healthcare professional if your symptoms do not start to get better or if they get worse. Visit your doctor or health care professional for regular checks on your progress. Do not stop taking except on your doctor's advice. You may develop a severe reaction. Your doctor will tell you how much medicine to take. Wear a medical identification bracelet or chain if you are taking this medicine for seizures, and carry a card that describes your disease and details of your medicine and dosage times. You may get drowsy or dizzy. Do not drive, use machinery, or do anything that needs mental alertness until you know how this medicine affects you. Do not stand or sit up quickly, especially if you are an older patient. This reduces the risk of dizzy or fainting spells. Alcohol may interfere with the effect of this medicine.  Avoid alcoholic drinks. If you have a heart condition, like congestive heart failure, and notice that you are retaining water and have swelling in your hands or feet, contact your health care provider immediately. The use of this medicine may increase the chance of suicidal thoughts or actions. Pay special attention to how you are responding while on this medicine. Any worsening of mood, or thoughts of suicide or dying should be reported to your health care professional right away. This medicine has caused reduced sperm counts in some men. This may interfere with the ability to father a child. You should talk to your doctor or health care professional if you are concerned about your fertility. Women who become pregnant while using this medicine for seizures may enroll in the [place] [employer] Antiepileptic Drug  Pregnancy Registry by calling [phone]. This registry collects information about the safety of antiepileptic drug use during pregnancy. What side effects may I notice from receiving this medicine? Side effects that you should report to your doctor or health care professional as soon as possible: -allergic reactions like skin rash, itching or hives, swelling of the face, lips, or tongue -breathing problems -changes in vision -chest pain -confusion -jerking or unusual movements of any part of your body -loss of memory -muscle pain, tenderness, or weakness -suicidal thoughts or other mood changes -swelling of the ankles, feet, hands -unusual bruising or bleeding Side effects that usually do not require medical attention (Report these to your doctor or health care professional if they continue or are bothersome.): -dizziness -drowsiness -dry mouth -headache -nausea -tremors -trouble sleeping -weight gain This list may not describe all possible side effects. Call your doctor for medical advice about side effects. You may report side effects to FDA at 1-800-FDA-1088. Where should I keep my medicine? Keep out of the reach of children. This medicine can be abused. Keep your medicine in a safe place to protect it from theft. Do not share this medicine with anyone. Selling or giving away this medicine is dangerous and against the law. Store at room temperature between 15 and 30 degrees C (59 and 86 degrees F). Throw away any unused medicine after the expiration date. NOTE: This sheet is a summary. It may not cover all possible information. If you have questions about this medicine, talk to your doctor, pharmacist, or health care provider.  2014, [employer] Standard. (2010-08-01 20:00:36) Insomnia Insomnia is frequent trouble falling and/or staying asleep. Insomnia can be a long term problem or a short term problem. Both are common. Insomnia can be a short term problem when the  wakefulness is related to a certain stress or worry. Long term insomnia is often related to ongoing stress during waking hours and/or poor sleeping habits. Overtime, sleep deprivation itself can make the problem worse. Every little thing feels more severe because you are overtired and your ability to cope is decreased. CAUSES   Stress, anxiety, and depression.  Poor sleeping habits.  Distractions such as TV in the bedroom.  Naps close to bedtime.  Engaging in emotionally charged conversations before bed.  Technical reading before sleep.  Alcohol and other sedatives. They may make the problem worse. They can hurt normal sleep patterns and normal dream activity.  Stimulants such as caffeine for several hours prior to bedtime.  Pain syndromes and shortness of breath can cause insomnia.  Exercise late at night.  Changing time zones may cause sleeping problems (jet lag). It is sometimes helpful to have someone observe your sleeping patterns. They should  look for periods of not breathing during the night (sleep apnea). They should also look to see how long those periods last. If you live alone or observers are uncertain, you can also be observed at a sleep clinic where your sleep patterns will be professionally monitored. Sleep apnea requires a checkup and treatment. Give your caregivers your medical history. Give your caregivers observations your family has made about your sleep.  SYMPTOMS   Not feeling rested in the morning.  Anxiety and restlessness at bedtime.  Difficulty falling and staying asleep. TREATMENT   Your caregiver may prescribe treatment for an underlying medical disorders. Your caregiver can give advice or help if you are using alcohol or other drugs for self-medication. Treatment of underlying problems will usually eliminate insomnia problems.  Medications can be prescribed for short time use. They are generally not recommended for lengthy use.  Over-the-counter  sleep medicines are not recommended for lengthy use. They can be habit forming.  You can promote easier sleeping by making lifestyle changes such as:  Using relaxation techniques that help with breathing and reduce muscle tension.  Exercising earlier in the day.  Changing your diet and the time of your last meal. No night time snacks.  Establish a regular time to go to bed.  Counseling can help with stressful problems and worry.  Soothing music and white noise may be helpful if there are background noises you cannot remove.  Stop tedious detailed work at least one hour before bedtime. HOME CARE INSTRUCTIONS   Keep a diary. Inform your caregiver about your progress. This includes any medication side effects. See your caregiver regularly. Take note of:  Times when you are asleep.  Times when you are awake during the night.  The quality of your sleep.  How you feel the next day. This information will help your caregiver care for you.  Get out of bed if you are still awake after 15 minutes. Read or do some quiet activity. Keep the lights down. Wait until you feel sleepy and go back to bed.  Keep regular sleeping and waking hours. Avoid naps.  Exercise regularly.  Avoid distractions at bedtime. Distractions include watching television or engaging in any intense or detailed activity like attempting to balance the household checkbook.  Develop a bedtime ritual. Keep a familiar routine of bathing, brushing your teeth, climbing into bed at the same time each night, listening to soothing music. Routines increase the success of falling to sleep faster.  Use relaxation techniques. This can be using breathing and muscle tension release routines. It can also include visualizing peaceful scenes. You can also help control troubling or intruding thoughts by keeping your mind occupied with boring or repetitive thoughts like the old concept of counting sheep. You can make it more creative like  imagining planting one beautiful flower after another in your backyard garden.  During your day, work to eliminate stress. When this is not possible use some of the previous suggestions to help reduce the anxiety that accompanies stressful situations. MAKE SURE YOU:   Understand these instructions.  Will watch your condition.  Will get help right away if you are not doing well or get worse. Document Released: 01/25/2000 Document Revised: 04/21/2011 Document Reviewed: 02/24/2007 [facility] Patient Information 2014 [place], [place].

## 2013-01-13 NOTE — Telephone Encounter (Signed)
appts made per 12/4 POF pt adv CT will call with appt AVS and June 2015 CAL given shh

## 2013-01-20 ENCOUNTER — Ambulatory Visit (HOSPITAL_COMMUNITY): Payer: Medicare Other

## 2013-01-20 ENCOUNTER — Telehealth: Payer: Self-pay | Admitting: Family Medicine

## 2013-01-20 MED ORDER — LORAZEPAM 0.5 MG PO TABS: 0.2500 mg | ORAL_TABLET | Freq: Three times a day (TID) | ORAL | Status: DC

## 2013-01-20 NOTE — Telephone Encounter (Signed)
Plz phone in low dose ativan for patient - to take 1/2 tablet to start and take at home to see how she does - may make her sleepy

## 2013-01-20 NOTE — Telephone Encounter (Signed)
Pt's daughter passed away yesterday and pt is requesting a mild medication to help with her nerves while she is dealing with her daughter's death  Dr. Milinda Antis out of office, please advise

## 2013-01-20 NOTE — Telephone Encounter (Signed)
Rx called in as prescribed and pt notified  

## 2013-01-28 ENCOUNTER — Ambulatory Visit (HOSPITAL_COMMUNITY): Payer: Medicare Other

## 2013-02-09 ENCOUNTER — Other Ambulatory Visit (HOSPITAL_COMMUNITY): Payer: Medicare Other

## 2013-02-09 ENCOUNTER — Ambulatory Visit (INDEPENDENT_AMBULATORY_CARE_PROVIDER_SITE_OTHER): Payer: Medicare Other | Admitting: Family Medicine

## 2013-02-09 ENCOUNTER — Encounter: Payer: Self-pay | Admitting: Family Medicine

## 2013-02-09 ENCOUNTER — Ambulatory Visit (HOSPITAL_COMMUNITY): Payer: Medicare Other

## 2013-02-09 VITALS — BP 122/62 | HR 95 | Temp 100.1°F | Ht 66.0 in | Wt 235.5 lb

## 2013-02-09 DIAGNOSIS — R509 Fever, unspecified: Secondary | ICD-10-CM | POA: Diagnosis not present

## 2013-02-09 DIAGNOSIS — J019 Acute sinusitis, unspecified: Secondary | ICD-10-CM | POA: Diagnosis not present

## 2013-02-09 DIAGNOSIS — J0100 Acute maxillary sinusitis, unspecified: Secondary | ICD-10-CM | POA: Insufficient documentation

## 2013-02-09 DIAGNOSIS — J309 Allergic rhinitis, unspecified: Secondary | ICD-10-CM | POA: Diagnosis not present

## 2013-02-09 LAB — POCT INFLUENZA A/B
Influenza A, POC: NEGATIVE
Influenza B, POC: NEGATIVE

## 2013-02-09 MED ORDER — AZITHROMYCIN 250 MG PO TABS: ORAL_TABLET | ORAL | Status: DC

## 2013-02-09 NOTE — Patient Instructions (Signed)
Drink lots of fluids and get rest  Take the zithromax as directed for sinus infection  mucinex may help congestion  Update if not starting to improve in a week or if worsening  Hope you feel better soon

## 2013-02-09 NOTE — Progress Notes (Signed)
Subjective:    Patient ID: Diamond Boyd, female    DOB: February 14, 1940, 72 y.o.   MRN: [id number]  HPI Here for uri symptoms since last week  Neg flu test today   Cough - some phlegm - green  Nasal congestion and facial pain on the L  L ear hurts - improved  ST is better   Fever 100.1   (at home about the same ) -- legs ache  Tylenol otc (none today) Delsym for cough   Patient Active Problem List   Diagnosis Date Noted  . Other malaise and fatigue 01/04/2013  . Herpes zoster 11/22/2012  . Vaginal pain 11/22/2012  . Cystocele 09/22/2012  . Encounter for Medicare annual wellness exam 09/17/2012  . Acute bacterial sinusitis 09/10/2012  . Left knee pain 09/10/2012  . Thoracic back pain 06/01/2012  . Skin lesion of face 05/17/2012  . Other screening mammogram 07/22/2011  . Routine gynecological examination 07/22/2011  . Colon cancer screening 07/22/2011  . Screening for lipoid disorders 07/17/2011  . History of anemia 05/06/2011  . 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 Hypothyroid 02/24/2011  . Varicose veins 02/24/2011  . Elevated blood pressure 11/20/2010  . Obesity 11/20/2010  . 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 Other chronic sinusitis 07/03/2009  . THROAT PAIN, CHRONIC 07/03/2009  . GERD 12/20/2008  . Anxiety state, unspecified 08/22/2008  . MENOPAUSAL SYNDROME 07/13/2007  . HYPERCHOLESTEROLEMIA, PURE 07/08/2006  . ALLERGIC  RHINITIS 07/08/2006  . OVERACTIVE BLADDER 07/08/2006  . 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 DEPRESSION 06/25/2006  . PERIPHERAL VASCULAR DISEASE 06/25/2006  . OSTEOARTHRITIS 06/25/2006  . LEG EDEMA 06/25/2006  . SKIN CANCER, HX OF 06/25/2006  . COLONIC POLYPS, HX OF 06/25/2006   Past Medical History  Diagnosis Date  . Colon polyps   . Depression   . Hypothyroidism   . Osteoarthritis     hands  . Peripheral vascular disease   . Degenerative disk disease    spine in center in past   . Cancer     skin cancer- basal cell on arm / colon 2011  . Lymphoma   . Colon cancer   . 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 Cholecystectomy    . Splenectomy      lymphoma  . Breast biopsy  1996  . Plantar fascia release    . Ventral hernia repair  1998  . Tendon release      Right thumb   . Colectomy  8/11  . Vaginal hysterectomy     History  Substance Use Topics  . Smoking status: Never Smoker   . Smokeless tobacco: Not on file  . Alcohol Use: No   Family History  Problem Relation Age of Onset  . Coronary artery disease Mother   . Alcohol abuse Mother   . Cancer Father     jaw  . Diabetes Brother   . Fibromyalgia Daughter     chronic pain   . COPD Daughter   . Colon cancer Neg Hx   . Colon polyps Neg Hx   . Stomach cancer Neg Hx    Allergies  Allergen Reactions  . Achromycin [Tetracycline Hcl]     Pt does not remember reaction  . Allopurinol     REACTION: Unsure of reaction happene years ago  . Cephalexin     REACTION: unsure of reaction happened yrs ago.  Marland Kitchen  Ciprofloxacin     REACTION: rash  . Codeine     REACTION: abd. pain  . Meloxicam     REACTION: GI symptoms  . Minocycline     Abdominal pain  . Nabumetone     REACTION: reaction not known  . Nyquil [Pseudoeph-Doxylamine-Dm-Apap] Hives  . Penicillins     REACTION: reaction not known  . Zolpidem Tartrate     REACTION: feels too drugged  . Buspar [Buspirone Hcl]     Dizziness, and not as effective for anxiety   Current Outpatient Prescriptions on File Prior to Visit  Medication Sig Dispense Refill  . Calcium Carbonate-Vitamin D (CALCIUM 600+D) 600-400 MG-UNIT per tablet Take 1 tablet by mouth daily.        . celecoxib (CELEBREX) 200 MG capsule Take 1 capsule (200 mg total) by mouth daily as needed for pain (with food).  90 capsule  3  . cholecalciferol (VITAMIN D) 1000 UNITS tablet Take 1,000 Units by mouth daily.         . cyclobenzaprine (FLEXERIL) 10 MG tablet Take 1 tablet (10 mg total) by mouth 3 (three) times daily as needed for muscle spasms.  30 tablet  3  . estradiol (ESTRACE) 2 MG tablet Take 1 tablet (2 mg total) by mouth 2 (two) times daily.  180 tablet  3  . fexofenadine (ALLEGRA) 180 MG tablet Take 1 tablet (180 mg total) by mouth daily.  90 tablet  3  . FLUoxetine (PROZAC) 20 MG capsule Take 2 capsules (40 mg total) by mouth 2 (two) times daily.  180 capsule  3  . fluticasone (FLONASE) 50 MCG/ACT nasal spray Place 2 sprays into the nose daily. 1 spray in each nostril twice daily  48 g  3  . levothyroxine (SYNTHROID, LEVOTHROID) 112 MCG tablet Take 1 tablet (112 mcg total) by mouth daily.  90 tablet  3  . LORazepam (ATIVAN) 0.5 MG tablet Take 0.5-1 tablets (0.25-0.5 mg total) by mouth every 8 (eight) hours.  10 tablet  0  . NON FORMULARY once a week. ALLERGY SHOTS      . Omega-3 350 MG CAPS Take by mouth. Take once capsule by mouth once a day       . omeprazole (PRILOSEC) 20 MG capsule Take 1 capsule (20 mg total) by mouth 2 (two) times daily. Take one 30-60 min before first and last meals of the day as long as coughing at all, then once daily before breakfast  180 capsule  3  . PROSED/DS, ATROPINE FREE, 81.6 MG TABS Take 1 tablet (81.6 mg total) by mouth 4 (four) times daily.  120 each  5  . psyllium (METAMUCIL) 58.6 % packet Take 1 packet by mouth daily.      . simvastatin (ZOCOR) 40 MG tablet Take 1 tablet (40 mg total) by mouth daily.  90 tablet  3  . vitamin E (VITAMIN E) 400 UNIT capsule Take 400 Units by mouth daily.         Current Facility-Administered Medications on File Prior to Visit  Medication Dose Route Frequency Provider Last Rate Last Dose  . 0.9 %  sodium chloride infusion  500 mL Intravenous Continuous Louis Meckel, MD         Review of Systems Review of Systems  Constitutional: Negative for  appetite change,and unexpected weight change.  ENT pos for cong and rhinorrhea  and facial pain and st Eyes: Negative for pain and visual disturbance.  Respiratory: Negative for  shortness of breath.   Cardiovascular: Negative for cp or palpitations    Gastrointestinal: Negative for nausea, diarrhea and constipation.  Genitourinary: Negative for urgency and frequency.  Skin: Negative for pallor or rash   Neurological: Negative for weakness, light-headedness, numbness and headaches.  Hematological: Negative for adenopathy. Does not bruise/bleed easily.  Psychiatric/Behavioral: Negative for dysphoric mood. The patient is not nervous/anxious.         Objective:   Physical Exam  Constitutional: She appears well-developed and well-nourished. No distress.  HENT:  Head: Normocephalic and atraumatic.  Right Ear: External ear normal.  Left Ear: External ear normal.  Mouth/Throat: Oropharynx is clear and moist.  Nares are injected and congested  Tender maxilalry sinuses worse on R Purulent post nasal drip     Eyes: Conjunctivae and EOM are normal. Pupils are equal, round, and reactive to light. Right eye exhibits no discharge. Left eye exhibits no discharge. No scleral icterus.  Neck: Normal range of motion. Neck supple.  Cardiovascular: Normal rate and regular rhythm.   Pulmonary/Chest: Effort normal and breath sounds normal. No respiratory distress. She has no wheezes.  Abdominal: Soft. Bowel sounds are normal.  Lymphadenopathy:    She has no cervical adenopathy.  Neurological: She is alert. No cranial nerve deficit.  Skin: Skin is warm and dry. No rash noted.  Psychiatric: She has a normal mood and affect.          Assessment & Plan:

## 2013-02-09 NOTE — Progress Notes (Signed)
Pre-visit discussion using our clinic review tool. No additional management support is needed unless otherwise documented below in the visit note.  

## 2013-02-10 DIAGNOSIS — C8190 Hodgkin lymphoma, unspecified, unspecified site: Secondary | ICD-10-CM | POA: Insufficient documentation

## 2013-02-10 NOTE — Assessment & Plan Note (Signed)
Cover with zpak-pt is intol to many abx Hx of lymphoma  Disc symptomatic care - see instructions on AVS  Update if not starting to improve in a week or if worsening

## 2013-02-23 ENCOUNTER — Other Ambulatory Visit: Payer: Self-pay

## 2013-02-23 ENCOUNTER — Ambulatory Visit (HOSPITAL_COMMUNITY): Payer: Medicare Other

## 2013-02-23 NOTE — Telephone Encounter (Signed)
Pt left v/m pt was seen 02/09/14 with sinusitis; pt finished Zpack and still when blows nose has green mucus; no fever, head or chest congestion, SOB,wheezing and no CP. Pt has non prod cough. Pt is allergic to a lot of antibiotics and pt request refill Zpak. [facility] [address]Please advise.

## 2013-02-24 MED ORDER — AZITHROMYCIN 250 MG PO TABS: ORAL_TABLET | ORAL | Status: DC

## 2013-02-24 NOTE — Telephone Encounter (Signed)
Please refill times one and have her f/u if no imp

## 2013-02-24 NOTE — Telephone Encounter (Signed)
Rx sent and pt notified to f/u if no improvement

## 2013-02-28 ENCOUNTER — Other Ambulatory Visit: Payer: Self-pay | Admitting: Medical Oncology

## 2013-02-28 DIAGNOSIS — C184 Malignant neoplasm of transverse colon: Secondary | ICD-10-CM

## 2013-03-01 ENCOUNTER — Telehealth: Payer: Self-pay | Admitting: Internal Medicine

## 2013-03-01 NOTE — Telephone Encounter (Signed)
s/w pt re appt for lb 1/28 @ 10:30am. pt aware of ct scan.

## 2013-03-09 ENCOUNTER — Ambulatory Visit (HOSPITAL_COMMUNITY)
Admission: RE | Admit: 2013-03-09 | Discharge: 2013-03-09 | Disposition: A | Discharge status: Home / Self Care | Payer: Medicare Other | Source: Ambulatory Visit | Attending: Internal Medicine | Admitting: Internal Medicine

## 2013-03-09 ENCOUNTER — Telehealth: Payer: Self-pay | Admitting: Internal Medicine

## 2013-03-09 ENCOUNTER — Other Ambulatory Visit: Payer: Self-pay | Admitting: Internal Medicine

## 2013-03-09 ENCOUNTER — Telehealth: Payer: Self-pay

## 2013-03-09 ENCOUNTER — Other Ambulatory Visit (HOSPITAL_BASED_OUTPATIENT_CLINIC_OR_DEPARTMENT_OTHER): Payer: Medicare Other

## 2013-03-09 ENCOUNTER — Encounter (HOSPITAL_COMMUNITY): Payer: Self-pay

## 2013-03-09 DIAGNOSIS — R918 Other nonspecific abnormal finding of lung field: Secondary | ICD-10-CM | POA: Insufficient documentation

## 2013-03-09 DIAGNOSIS — R599 Enlarged lymph nodes, unspecified: Secondary | ICD-10-CM | POA: Diagnosis not present

## 2013-03-09 DIAGNOSIS — Z9089 Acquired absence of other organs: Secondary | ICD-10-CM | POA: Insufficient documentation

## 2013-03-09 DIAGNOSIS — C184 Malignant neoplasm of transverse colon: Secondary | ICD-10-CM

## 2013-03-09 DIAGNOSIS — C8589 Other specified types of non-Hodgkin lymphoma, extranodal and solid organ sites: Secondary | ICD-10-CM | POA: Insufficient documentation

## 2013-03-09 DIAGNOSIS — M47812 Spondylosis without myelopathy or radiculopathy, cervical region: Secondary | ICD-10-CM | POA: Insufficient documentation

## 2013-03-09 DIAGNOSIS — C189 Malignant neoplasm of colon, unspecified: Secondary | ICD-10-CM | POA: Insufficient documentation

## 2013-03-09 DIAGNOSIS — I700 Atherosclerosis of aorta: Secondary | ICD-10-CM | POA: Insufficient documentation

## 2013-03-09 DIAGNOSIS — C8587 Other specified types of non-Hodgkin lymphoma, spleen: Secondary | ICD-10-CM

## 2013-03-09 DIAGNOSIS — Z9049 Acquired absence of other specified parts of digestive tract: Secondary | ICD-10-CM | POA: Insufficient documentation

## 2013-03-09 DIAGNOSIS — E049 Nontoxic goiter, unspecified: Secondary | ICD-10-CM | POA: Insufficient documentation

## 2013-03-09 LAB — COMPREHENSIVE METABOLIC PANEL (CC13)
ALT: 31 U/L (ref 0–55)
ANION GAP: 12 meq/L — AB (ref 3–11)
AST: 52 U/L — ABNORMAL HIGH (ref 5–34)
Albumin: 4 g/dL (ref 3.5–5.0)
Alkaline Phosphatase: 183 U/L — ABNORMAL HIGH (ref 40–150)
BUN: 8.5 mg/dL (ref 7.0–26.0)
CALCIUM: 9.9 mg/dL (ref 8.4–10.4)
CO2: 28 meq/L (ref 22–29)
Chloride: 102 mEq/L (ref 98–109)
Creatinine: 0.7 mg/dL (ref 0.6–1.1)
GLUCOSE: 104 mg/dL (ref 70–140)
POTASSIUM: 4.2 meq/L (ref 3.5–5.1)
SODIUM: 142 meq/L (ref 136–145)
TOTAL PROTEIN: 6.7 g/dL (ref 6.4–8.3)
Total Bilirubin: 0.48 mg/dL (ref 0.20–1.20)

## 2013-03-09 MED ORDER — IOHEXOL 300 MG/ML  SOLN
100.0000 mL | Freq: Once | INTRAMUSCULAR | Status: AC | PRN
  Administered 2013-03-09: 100 mL via INTRAVENOUS

## 2013-03-09 NOTE — Telephone Encounter (Signed)
Gave pt apt for MD visit, informed the pt regarding PET SCAN order needs to have scan before MD, ask pt to call us to r/s if PET is later than 2/9

## 2013-03-09 NOTE — Telephone Encounter (Signed)
I discussed with the patient the results of her CT of abdomen and pelvis with contrast.  It demonstrated a small right lower lobe pulmonary nodule measuring up to 8 mm, new, suspicious for metastases.   We will have her obtain a PET-CT for further evaluation.   She is agreeable and will follow up one week follow PET to discuss results.

## 2013-03-09 NOTE — Telephone Encounter (Signed)
Radiology called with results of CT. Forwarded information to Dr [employer] desk nurse to make sure Dr sees report.

## 2013-03-11 ENCOUNTER — Encounter: Payer: Self-pay | Admitting: Family Medicine

## 2013-03-11 ENCOUNTER — Ambulatory Visit (INDEPENDENT_AMBULATORY_CARE_PROVIDER_SITE_OTHER): Payer: Medicare Other | Admitting: Family Medicine

## 2013-03-11 VITALS — BP 126/82 | HR 91 | Temp 98.6°F | Ht 66.0 in | Wt 232.0 lb

## 2013-03-11 DIAGNOSIS — J328 Other chronic sinusitis: Secondary | ICD-10-CM

## 2013-03-11 DIAGNOSIS — R51 Headache: Secondary | ICD-10-CM | POA: Diagnosis not present

## 2013-03-11 DIAGNOSIS — R7309 Other abnormal glucose: Secondary | ICD-10-CM | POA: Diagnosis not present

## 2013-03-11 DIAGNOSIS — R519 Headache, unspecified: Secondary | ICD-10-CM

## 2013-03-11 DIAGNOSIS — R739 Hyperglycemia, unspecified: Secondary | ICD-10-CM | POA: Insufficient documentation

## 2013-03-11 LAB — HEMOGLOBIN A1C: HEMOGLOBIN A1C: 6.2 % (ref 4.6–6.5)

## 2013-03-11 LAB — SEDIMENTATION RATE: SED RATE: 15 mm/h (ref 0–22)

## 2013-03-11 MED ORDER — AZITHROMYCIN 250 MG PO TABS: ORAL_TABLET | ORAL | Status: DC

## 2013-03-11 NOTE — Progress Notes (Signed)
Pre-visit discussion using our clinic review tool. No additional management support is needed unless otherwise documented below in the visit note.  

## 2013-03-11 NOTE — Progress Notes (Signed)
Subjective:    Patient ID: Diamond Boyd, female    DOB: 1940/05/15, 73 y.o.   MRN: [id number]  HPI Still has sinus symptoms - a little improvement from December but not much  Pain in L maxillary sinus into her ear  Takes tylenol for that  No purulent nasal drainage  No fever  Is coughing - not a lot - not much prod - not as junky as it was   The oncologist is following a nodule in her R lung - Feb 9th will have a PET scan and will follow up  Also will re image her throat/tonsil area    She had a non fasting glucose lab at oncologist - 150s  Wants to keep an eye on that  According to husband -she gets "weak spells" when she does not eat regularly Also if she eats sugar   Patient Active Problem List   Diagnosis Date Noted  . Acute sinusitis 02/09/2013  . Other malaise and fatigue 01/04/2013  . Herpes zoster 11/22/2012  . Vaginal pain 11/22/2012  . Cystocele 09/22/2012  . Encounter for Medicare annual wellness exam 09/17/2012  . Acute bacterial sinusitis 09/10/2012  . Left knee pain 09/10/2012  . Thoracic back pain 06/01/2012  . Skin lesion of face 05/17/2012  . Other screening mammogram 07/22/2011  . Routine gynecological examination 07/22/2011  . Colon cancer screening 07/22/2011  . Screening for lipoid disorders 07/17/2011  . History of anemia 05/06/2011  . 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 Hypothyroid 02/24/2011  . Varicose veins 02/24/2011  . Elevated blood pressure 11/20/2010  . Obesity 11/20/2010  . 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 Other chronic sinusitis 07/03/2009  . THROAT PAIN, CHRONIC 07/03/2009  . GERD 12/20/2008  . Anxiety state, unspecified 08/22/2008  . MENOPAUSAL SYNDROME 07/13/2007  . HYPERCHOLESTEROLEMIA, PURE 07/08/2006  . ALLERGIC  RHINITIS 07/08/2006  . OVERACTIVE BLADDER 07/08/2006  . 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 DEPRESSION 06/25/2006  . PERIPHERAL  VASCULAR DISEASE 06/25/2006  . OSTEOARTHRITIS 06/25/2006  . LEG EDEMA 06/25/2006  . SKIN CANCER, HX OF 06/25/2006  . COLONIC POLYPS, HX OF 06/25/2006   Past Medical History  Diagnosis Date  . Colon polyps   . Depression   . Hypothyroidism   . Osteoarthritis     hands  . Peripheral vascular disease   . Degenerative disk disease     spine in center in past   . Cancer     skin cancer- basal cell on arm / colon 2011  . Lymphoma   . Colon cancer   . 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 Cholecystectomy    . Splenectomy      lymphoma  . Breast biopsy  1996  . Plantar fascia release    . Ventral hernia repair  1998  . Tendon release      Right thumb   . Colectomy  8/11  . Vaginal hysterectomy     History  Substance Use Topics  . Smoking status: Never Smoker   . Smokeless tobacco: Not on file  . Alcohol Use: No   Family History  Problem Relation Age of Onset  . Coronary artery disease Mother   . Alcohol abuse Mother   . Cancer Father     jaw  . Diabetes Brother   . Fibromyalgia Daughter     chronic pain   . COPD  Daughter   . Colon cancer Neg Hx   . Colon polyps Neg Hx   . Stomach cancer Neg Hx    Allergies  Allergen Reactions  . Achromycin [Tetracycline Hcl]     Pt does not remember reaction  . Allopurinol     REACTION: Unsure of reaction happene years ago  . Cephalexin     REACTION: unsure of reaction happened yrs ago.  . Ciprofloxacin     REACTION: rash  . Codeine     REACTION: abd. pain  . Meloxicam     REACTION: GI symptoms  . Minocycline     Abdominal pain  . Nabumetone     REACTION: reaction not known  . Nyquil [Pseudoeph-Doxylamine-Dm-Apap] Hives  . Penicillins     REACTION: reaction not known  . Zolpidem Tartrate     REACTION: feels too drugged  . Buspar [Buspirone Hcl]     Dizziness, and not as effective for anxiety   Current Outpatient Prescriptions on File Prior to Visit  Medication  Sig Dispense Refill  . Calcium Carbonate-Vitamin D (CALCIUM 600+D) 600-400 MG-UNIT per tablet Take 1 tablet by mouth daily.        . celecoxib (CELEBREX) 200 MG capsule Take 1 capsule (200 mg total) by mouth daily as needed for pain (with food).  90 capsule  3  . cholecalciferol (VITAMIN D) 1000 UNITS tablet Take 1,000 Units by mouth daily.        . cyclobenzaprine (FLEXERIL) 10 MG tablet Take 1 tablet (10 mg total) by mouth 3 (three) times daily as needed for muscle spasms.  30 tablet  3  . estradiol (ESTRACE) 2 MG tablet Take 1 tablet (2 mg total) by mouth 2 (two) times daily.  180 tablet  3  . fexofenadine (ALLEGRA) 180 MG tablet Take 1 tablet (180 mg total) by mouth daily.  90 tablet  3  . fluticasone (FLONASE) 50 MCG/ACT nasal spray Place 2 sprays into the nose daily. 1 spray in each nostril twice daily  48 g  3  . levothyroxine (SYNTHROID, LEVOTHROID) 112 MCG tablet Take 1 tablet (112 mcg total) by mouth daily.  90 tablet  3  . LORazepam (ATIVAN) 0.5 MG tablet Take 0.5-1 tablets (0.25-0.5 mg total) by mouth every 8 (eight) hours.  10 tablet  0  . NON FORMULARY once a week. ALLERGY SHOTS      . Omega-3 350 MG CAPS Take by mouth. Take once capsule by mouth once a day       . omeprazole (PRILOSEC) 20 MG capsule Take 1 capsule (20 mg total) by mouth 2 (two) times daily. Take one 30-60 min before first and last meals of the day as long as coughing at all, then once daily before breakfast  180 capsule  3  . psyllium (METAMUCIL) 58.6 % packet Take 1 packet by mouth daily.      . simvastatin (ZOCOR) 40 MG tablet Take 1 tablet (40 mg total) by mouth daily.  90 tablet  3  . vitamin E (VITAMIN E) 400 UNIT capsule Take 400 Units by mouth daily.         Current Facility-Administered Medications on File Prior to Visit  Medication Dose Route Frequency Provider Last Rate Last Dose  . 0.9 %  sodium chloride infusion  500 mL Intravenous Continuous Inda Castle, MD          Review of Systems Review of  Systems  Constitutional: Negative for fever, appetite change, fatigue and  unexpected weight change.  Eyes: Negative for pain and visual disturbance.  ENt pos for occ congestion that is imp/ pos for facial and ear pain neg for st  Respiratory: Negative for cough and shortness of breath.   Cardiovascular: Negative for cp or palpitations    Gastrointestinal: Negative for nausea, diarrhea and constipation.  Genitourinary: Negative for urgency and frequency.  Skin: Negative for pallor or rash   Neurological: Negative for weakness, light-headedness, numbness and headaches.  Hematological: Negative for adenopathy. Does not bruise/bleed easily.  Psychiatric/Behavioral: Negative for dysphoric mood. The patient is not nervous/anxious.         Objective:   Physical Exam  Constitutional: She appears well-developed and well-nourished. No distress.  obese and well appearing   HENT:  Head: Normocephalic and atraumatic.  Right Ear: External ear normal.  Left Ear: External ear normal.  Mouth/Throat: Oropharynx is clear and moist.  Nares are boggy L maxillary sinus tenderness bilat mild temporal tenderness  Eyes: Conjunctivae and EOM are normal. Pupils are equal, round, and reactive to light. Right eye exhibits no discharge. Left eye exhibits no discharge. No scleral icterus.  Neck: Normal range of motion. Neck supple. No JVD present. Carotid bruit is not present. No thyromegaly present.  Cardiovascular: Normal rate, regular rhythm, normal heart sounds and intact distal pulses.  Exam reveals no gallop.   Pulmonary/Chest: Effort normal and breath sounds normal. No respiratory distress. She has no wheezes. She exhibits no tenderness.  Abdominal: Soft. Bowel sounds are normal. She exhibits no distension, no abdominal bruit and no mass. There is no tenderness.  Musculoskeletal: She exhibits no edema and no tenderness.  Lymphadenopathy:    She has no cervical adenopathy.  Neurological: She is alert. She  has normal reflexes. She displays no atrophy and no tremor. No cranial nerve deficit or sensory deficit. She exhibits normal muscle tone. Coordination normal.          Assessment & Plan:

## 2013-03-11 NOTE — Patient Instructions (Signed)
Hold the px for zpak- do not fill it unless I tell you to  Labs today- I am checking you for diabetes and also a condition called temporal arteritis (that can cause headache) Watch diet for sugar and starch Avoid sweets  Eat 6 small meals per day with protein instead of 3 large ones to see if you feel better

## 2013-03-13 NOTE — Assessment & Plan Note (Signed)
?  If recurrent sinus infx after zpack or if this is facial pain from another cause  For PET scan soon  Check esr today in light of temporal tenderness

## 2013-03-13 NOTE — Assessment & Plan Note (Signed)
Esr today  If neg - will take zithromax for presumed sinusitis today May need CT of sinuses if not imp  Does have PET scan upcoming as well

## 2013-03-13 NOTE — Assessment & Plan Note (Signed)
Non fasting sugar 151 at oncologist a1c today She is at risk of DM due to wt  Also tends to have symptomatic low glucose episodes  Rev low glycemic diet/ need for wt loss and exercise and plan to eat 6 small meals daily with protein

## 2013-03-21 ENCOUNTER — Encounter (HOSPITAL_COMMUNITY): Payer: Self-pay

## 2013-03-21 ENCOUNTER — Telehealth: Payer: Self-pay | Admitting: Internal Medicine

## 2013-03-21 ENCOUNTER — Ambulatory Visit (HOSPITAL_COMMUNITY)
Admission: RE | Admit: 2013-03-21 | Discharge: 2013-03-21 | Disposition: A | Discharge status: Home / Self Care | Payer: Medicare Other | Source: Ambulatory Visit | Attending: Internal Medicine | Admitting: Internal Medicine

## 2013-03-21 ENCOUNTER — Ambulatory Visit (HOSPITAL_BASED_OUTPATIENT_CLINIC_OR_DEPARTMENT_OTHER): Payer: Medicare Other | Admitting: Internal Medicine

## 2013-03-21 VITALS — BP 135/71 | HR 89 | Temp 97.0°F | Resp 20 | Ht 66.0 in | Wt 231.1 lb

## 2013-03-21 DIAGNOSIS — R918 Other nonspecific abnormal finding of lung field: Secondary | ICD-10-CM | POA: Insufficient documentation

## 2013-03-21 DIAGNOSIS — E079 Disorder of thyroid, unspecified: Secondary | ICD-10-CM

## 2013-03-21 DIAGNOSIS — B0222 Postherpetic trigeminal neuralgia: Secondary | ICD-10-CM

## 2013-03-21 DIAGNOSIS — C184 Malignant neoplasm of transverse colon: Secondary | ICD-10-CM

## 2013-03-21 DIAGNOSIS — Z87898 Personal history of other specified conditions: Secondary | ICD-10-CM | POA: Insufficient documentation

## 2013-03-21 DIAGNOSIS — M899 Disorder of bone, unspecified: Secondary | ICD-10-CM | POA: Insufficient documentation

## 2013-03-21 DIAGNOSIS — C8587 Other specified types of non-Hodgkin lymphoma, spleen: Secondary | ICD-10-CM | POA: Diagnosis not present

## 2013-03-21 DIAGNOSIS — E041 Nontoxic single thyroid nodule: Secondary | ICD-10-CM | POA: Insufficient documentation

## 2013-03-21 DIAGNOSIS — E0789 Other specified disorders of thyroid: Secondary | ICD-10-CM | POA: Diagnosis not present

## 2013-03-21 DIAGNOSIS — M949 Disorder of cartilage, unspecified: Secondary | ICD-10-CM | POA: Diagnosis not present

## 2013-03-21 DIAGNOSIS — J984 Other disorders of lung: Secondary | ICD-10-CM | POA: Diagnosis not present

## 2013-03-21 DIAGNOSIS — Z85038 Personal history of other malignant neoplasm of large intestine: Secondary | ICD-10-CM | POA: Insufficient documentation

## 2013-03-21 LAB — GLUCOSE, CAPILLARY: Glucose-Capillary: 96 mg/dL (ref 70–99)

## 2013-03-21 MED ORDER — FLUDEOXYGLUCOSE F - 18 (FDG) INJECTION
12.2000 | Freq: Once | INTRAVENOUS | Status: AC | PRN
  Administered 2013-03-21: 12.2 via INTRAVENOUS

## 2013-03-21 NOTE — Telephone Encounter (Signed)
Gave pt appt for Md on february 2015

## 2013-03-21 NOTE — Progress Notes (Signed)
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 LYMPHOMA NEC, MLIG, SPLEEN   CURRENT THERAPY: Surveillance.   INTERVAL HISTORY: SHAKEMIA MADERA 73 y.o. 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  She was last seen me on 01/13/2013.  She is accompanied by her husband Ray.  Doing her last follow-up she had a follow a CT of abdomen and pelvis which demonstrated 3 small pulmonary nodules in the RLL concerning for metastases with recommendations for a PET/CT.  PET/CT was obtained today is as outlined below.    Today, she reports some weight lost about 10 lbs since her last visit.  Otherwside, her energy and appetite is stable.  Prior to her recent CT, she underwent a CT scan of the abdomen and pelvis with IV contrast on 01/21/2012. There was no significant adenopathy and no evidence for recurrent colon cancer. 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  She denies specifically any pain, difficulty eating, trouble with her bowels, blood in the stool, fever, chills, night sweats, or any sense of ill health. She is without complaints today.  MEDICAL HISTORY: Past Medical History  Diagnosis Date  . Colon polyps   . Depression   . Hypothyroidism   . Osteoarthritis     hands  . Peripheral vascular disease   . Degenerative disk disease     spine in center in past   . Cancer     skin cancer- basal cell on arm / colon 2011  . Lymphoma   . Colon cancer    . 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    INTERIM HISTORY: has MALIGNANT NEOPLASM OF TRANSVERSE COLON; LYMPHOMA NEC, MLIG, SPLEEN; HYPERCHOLESTEROLEMIA, PURE; DISORDERS OF PHOSPHORUS METABOLISM; Anxiety state, unspecified; DEPRESSION; PERIPHERAL VASCULAR DISEASE; Other chronic sinusitis; ALLERGIC  RHINITIS; GERD; OVERACTIVE BLADDER; MENOPAUSAL SYNDROME; OSTEOARTHRITIS; LEG EDEMA; THROAT PAIN, CHRONIC; SKIN CANCER, HX OF; COLONIC POLYPS, HX OF; Elevated blood pressure; Obesity; Hypothyroid; Varicose veins; Other asplenic status; History of anemia; Screening for lipoid disorders; Other screening mammogram; Routine gynecological examination; Colon cancer screening; Skin lesion of face; Thoracic back pain; Left knee pain; Encounter for Medicare annual wellness exam; Cystocele; Herpes zoster; Vaginal pain; Other malaise and fatigue; Acute sinusitis; Left temporal headache; and Hyperglycemia on her problem list.    ALLERGIES:  is allergic to achromycin; allopurinol; cephalexin; ciprofloxacin; codeine; meloxicam; minocycline; nabumetone; nyquil; penicillins; zolpidem tartrate; and buspar.  MEDICATIONS: has a current medication list which includes the following prescription(s): calcium carbonate-vitamin d, celecoxib, cholecalciferol, cyclobenzaprine, estradiol, fexofenadine, fluoxetine, fluticasone, levothyroxine, lorazepam, NON FORMULARY, omega-3, omeprazole, psyllium, simvastatin, and vitamin e, and the following Facility-Administered Medications: sodium chloride.  SURGICAL HISTORY:  Past Surgical History  Procedure Laterality Date  . Cholecystectomy    . Splenectomy      lymphoma  . Breast biopsy  1996  . Plantar fascia release    . Ventral hernia repair  1998  . Tendon release      Right thumb   .  Colectomy  8/11  . Vaginal hysterectomy     PROBLEM LIST:  1. Adenocarcinoma of the colon with 2 synchronous primaries dating back to August 2011 when the patient was found to have anemia. Both tumors  were associated with negative lymph nodes. One tumor was T2 N0. The other tumor was T3 N0. The T2 N0 stage I tumor was in the sigmoid colon. The T3 N0 stage II tumor was in the transverse colon. The smaller tumor measured 1.5 cm. The larger tumor measured 6.5 cm. Surgery was carried out on 09/28/2009. 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  2. 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 The patient had bone marrow involvement and splenomegaly at that time, also a hemolytic anemia. She underwent a splenectomy on  November 12, 1994. Through the years, the patient has received Rituxan most recently, dating back to May 2006. The patient received fludarabine in late 2002 and early 2003. She may have developed a pneumonitis from the fludarabine in December 2002. The patient has had involvement of the peripheral blood with villous lymphocytes. She appears to be in a state of complete clinical remission at this time. Tumor is CD5 and CD20 positive, also CD11c and FMC7 positive.  3. 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  4. History of lupus anticoagulant dating back to the 1990s.  5. History of iron deficiency anemia for which the patient received IV Feraheme in January 2012. Presumably, this occurred as a result of her colon cancer.  6. History of GERD resulting in cough diagnosed around 2010.  7. Dyslipidemia.  8. Hypothyroidism.  9. Irritable bowel syndrome.  10. Goiter seen on PET scan from 09/04/2009.  11. Enlarged left tonsil diagnosed April 2011, currently resolved.  12. History of depression.   REVIEW OF SYSTEMS:   Constitutional: Denies fevers, chills or abnormal weight loss Eyes: Denies blurriness of vision Ears, nose, mouth, throat, and face: Denies mucositis or sore throat Respiratory: Denies cough, dyspnea or wheezes Cardiovascular: Denies palpitation, chest discomfort or lower extremity  swelling Gastrointestinal:  Denies nausea, heartburn or change in bowel habits Skin: Denies abnormal skin rashes Lymphatics: Denies new lymphadenopathy or easy bruising Neurological:Denies numbness, tingling or new weaknesses Behavioral/Psych: Mood is stable, no new changes  All other systems were reviewed with the patient and are negative.  PHYSICAL EXAMINATION: ECOG PERFORMANCE STATUS: 0 - Asymptomatic  Blood pressure 135/71, pulse 89, temperature 97 F (36.1 C), temperature source Oral, resp. rate 20, height 5\' 6"  (1.676 m), weight 231 lb 1.6 oz (104.826 kg), last menstrual period 02/10/1970, SpO2 98.00%.  GENERAL:alert, no distress and comfortable; moderately obese.  SKIN: skin color, texture, turgor are normal, no rashes or significant lesions EYES: normal, Conjunctiva are pink and non-injected, sclera clear OROPHARYNX:no exudate, no erythema and lips, buccal mucosa, and tongue normal  NECK: supple, thyroid normal size, non-tender, without nodularity.  Left neck adenopathy.   LYMPH:  no palpable lymphadenopathy in the cervical, axillary or supraclavicular LUNGS: clear to auscultation and percussion with normal breathing effort HEART: regular rate & rhythm and no murmurs and no lower extremity edema ABDOMEN:abdomen soft, non-tender and normal bowel sounds; multiple surgical scars well healed.   Musculoskeletal:no cyanosis of digits and no clubbing  NEURO: alert & oriented x 3 with fluent speech, no focal motor/sensory deficits  Labs:  Lab Results  Component Value Date   WBC 6.0 01/13/2013   HGB 13.3 01/13/2013  HCT 40.6 01/13/2013   MCV 94.8 01/13/2013   PLT 234 01/13/2013   NEUTROABS 2.7 01/13/2013      Chemistry      Component Value Date/Time   NA 142 03/09/2013 1029   NA 140 09/17/2012 1323   K 4.2 03/09/2013 1029   K 4.3 09/17/2012 1323   CL 100 09/17/2012 1323   CL 105 07/14/2012 0939   CO2 28 03/09/2013 1029   CO2 30 09/17/2012 1323   BUN 8.5 03/09/2013 1029   BUN 14 09/17/2012  1323   CREATININE 0.7 03/09/2013 1029   CREATININE 0.8 09/17/2012 1323      Component Value Date/Time   CALCIUM 9.9 03/09/2013 1029   CALCIUM 9.5 09/17/2012 1323   CALCIUM 9.3 08/09/2009 0000   ALKPHOS 183* 03/09/2013 1029   ALKPHOS 82 09/17/2012 1323   AST 52* 03/09/2013 1029   AST 23 09/17/2012 1323   ALT 31 03/09/2013 1029   ALT 21 09/17/2012 1323   BILITOT 0.48 03/09/2013 1029   BILITOT 0.8 09/17/2012 1323     Results for MARGART, ZEMANEK (MRN [id number]) as of 03/21/2013 13:37  Ref. Range 01/13/2013 09:40  CEA Latest Range: 0.0-5.0 ng/mL 1.9   IMAGING STUDIES:  1. Digital screening mammogram from 08/05/2010 was negative.  2. Chest x-ray, 2 view, from 12/09/2010 was negative.  3. Digital screening mammogram on 08/20/2011 showed no specific evidence of malignancy  4. Chest x-ray, 2 view, from 11/19/2011 showed atherosclerotic calcifications within the arch of the aorta. There was evidence of a prior cholecystectomy and splenectomy, otherwise chest x-ray was negative.  5. CT scan of abdomen and pelvis with IV contrast obtained on 01/21/2012 showed no clear evidence of colon cancer metastasis. A retrocrural lymph node had increased in size but periportal adenopathy and periaortic adenopathy was stable to decreased. The retrocrural lymph node refer to measures 8 mm on image 12, increased from 5 mm on a prior study.  6. Chest x-ray, 2 view, from 06/01/2012 showed no acute abnormalities. There was borderline enlargement of the cardiac silhouette.  7. Chest x-ray, 2 view, from 03/09/2013, showed new pulmonary nodules in the right lung. Cannot rule out  metastatic disease. Correlation with CT recommended. (reviewed by me) 8. CT scan of abdomen and pelvis with IV contrast on 03/09/2013 showed small right lower lobe pulmonary nodules measuring up to 8 mm, new, suspicious for metastases. Small right juxtadiaphragmatic, upper abdominal, retroperitoneal, and left pelvic lymph nodes, stable versus mildly increased.  Prior splenectomy, cholecystectomy, and right hemicolectomy. PET-CT is suggested for further evaluation.  (reviewed by me) 9. PET/CT on 03/21/2013 revealed intensely hypermetabolic nodule within the left lobe of thyroid  gland. Concern for thyroid carcinoma. 2. Two intensely hypermetabolic right lower lobe pulmonary nodules. Differential includes thyroid cancer metastasis or colon cancer metastasis. 3. Intense uptake within the right sacrum is concerning metastasis. Milder abnormal uptake within the T9 vertebral body is indeterminate but concerning for metastasis.  PROCEDURES:  Colonoscopy was carried out by Dr. Erskine Emery on 10/10/2010.  ASSESSMENT: Diamond Boyd 73 y.o. female with a history of Malignant neoplasm of transverse colon  LYMPHOMA NEC, MLIG, SPLEEN  Thyroid lesion - Plan: US Soft Tissue Head/Neck   PLAN:  1. Marginal zone lymphoma.  --Her marginal zone lymphoma continues to be quiescent even though one of the lymph nodes  removed at the time of colon cancer surgery in August 2011 was found to have B-cell non-Hodgkin lymphoma. Alaila is on no treatment and in fact, has  not required any therapy since May 2006 at which time she received Rituxan.   --Her CT of abdomen is as indicated above.  She has had mild weight lost. Otherwise, she denies any additional constitutional symptoms such as fever or chills.   2. Colon cancer now with multiple lung nodules in RLL.   --Tyniya remains clinically stable.  However she has new pulmonary nodules in the RLL hypermetabolic by CT.  We will facilitate biopsy of one these via IR or surgery.   Her last colonoscopy was on  10/10/2010 by Dr. Deatra Ina.  We will make Dr. Deatra Ina of aware. CEA is stable at 1.9.     3. Thyroid nodule.    --Her PET/CT was also concerning for thyroid carcinoma.  I will evaluate with an ultrasound of the thyroid. TSH on 01/04/2013 was 0.51.  We will biopsy if necessary.   4. Post-herpetic neuralgia secondary shingles on  L gluteas maximus --Continue supportive care. Provided a handout about lyrica which might help.  She will discuss with her PCP.  5. Insomnia.  --Counseled patient on good sleep hygiene including avoiding frequent naps, caffeinated products in late afternoon.   Pain control for #4 if it continues to affect her sleep quality.   6. Follow-up.  --We will plan to see Manjit again in 1-2 weeks to discuss the results of her thyroid ultrasound and to biopsy if obtained by then.  Scans as noted above.   All questions were answered. The patient knows to call the clinic with any problems, questions or concerns. We can certainly see the patient much sooner if necessary.  I spent 15 minutes counseling the patient face to face. The total time spent in the appointment was 25 minutes.    Terion Hedman, MD 03/21/2013 1:34 PM

## 2013-03-22 ENCOUNTER — Other Ambulatory Visit: Payer: Self-pay | Admitting: Internal Medicine

## 2013-03-22 ENCOUNTER — Ambulatory Visit (HOSPITAL_COMMUNITY)
Admission: RE | Admit: 2013-03-22 | Discharge: 2013-03-22 | Disposition: A | Discharge status: Home / Self Care | Payer: Medicare Other | Source: Ambulatory Visit | Attending: Internal Medicine | Admitting: Internal Medicine

## 2013-03-22 ENCOUNTER — Other Ambulatory Visit: Payer: Self-pay | Admitting: Medical Oncology

## 2013-03-22 ENCOUNTER — Telehealth: Payer: Self-pay | Admitting: Internal Medicine

## 2013-03-22 DIAGNOSIS — E079 Disorder of thyroid, unspecified: Secondary | ICD-10-CM | POA: Diagnosis not present

## 2013-03-22 DIAGNOSIS — E0789 Other specified disorders of thyroid: Secondary | ICD-10-CM

## 2013-03-22 DIAGNOSIS — C189 Malignant neoplasm of colon, unspecified: Secondary | ICD-10-CM | POA: Insufficient documentation

## 2013-03-22 NOTE — Telephone Encounter (Signed)
I spoke with Dr. Vernard Gambles of Interventional radiology regarding her new pulmonary nodules.  He feels the one in the left upper lobe is approachable via percutaneous biopsy.  He recommended first a thyroid biopsy and await results.  If it does not help with diagnosis, then order the CT guided percutaneous biopsy of the lung nodule.

## 2013-03-25 ENCOUNTER — Telehealth: Payer: Self-pay

## 2013-03-25 ENCOUNTER — Other Ambulatory Visit: Payer: Self-pay

## 2013-03-25 NOTE — Telephone Encounter (Signed)
Pt called to say her thyroid biopsy is on Thursday. Had pt reschedule appt from 2/17 to the following week. To expect a call from the scheduler. POF sent.

## 2013-03-28 ENCOUNTER — Telehealth: Payer: Self-pay | Admitting: Internal Medicine

## 2013-03-28 NOTE — Telephone Encounter (Signed)
pt called to r/s 2/17 appt. per pt appt to be r/s due to pending bx this week and DC told her she would receive a call. per 2/13 pof f./u moved from 2/17 to the following week. pt  has new appt d/t for 2/16 @ 12pm.

## 2013-03-29 ENCOUNTER — Ambulatory Visit: Payer: Medicare Other

## 2013-03-30 ENCOUNTER — Telehealth: Payer: Self-pay

## 2013-03-30 MED ORDER — AZITHROMYCIN 250 MG PO TABS: ORAL_TABLET | ORAL | Status: DC

## 2013-03-30 NOTE — Telephone Encounter (Signed)
Pt left note; pt thought she had appt today instead of 04/06/13 with Dr Glori Bickers. Pt states she has sinus infection with chills,h/a,when blows nose mucus is green colored. Pt said her cancer doctor did test and pt has 3 nodules in her lungs and one on her throat. Pt request zpack to [facility] so can get rid of symptoms while cancer doctor is deciding what to do about nodules. Pt request cb.

## 2013-03-30 NOTE — Telephone Encounter (Signed)
Let her know I sent it in  F/u if no improvement

## 2013-03-30 NOTE — Telephone Encounter (Signed)
Pt notified Rx sent to pharmacy and to f/u if no improvement

## 2013-03-31 ENCOUNTER — Other Ambulatory Visit: Payer: Self-pay | Admitting: Internal Medicine

## 2013-03-31 ENCOUNTER — Other Ambulatory Visit (HOSPITAL_COMMUNITY)
Admission: RE | Admit: 2013-03-31 | Discharge: 2013-03-31 | Disposition: A | Discharge status: Home / Self Care | Payer: Medicare Other | Source: Ambulatory Visit | Attending: Interventional Radiology | Admitting: Interventional Radiology

## 2013-03-31 ENCOUNTER — Ambulatory Visit
Admission: RE | Admit: 2013-03-31 | Discharge: 2013-03-31 | Disposition: A | Discharge status: Home / Self Care | Payer: Medicare Other | Source: Ambulatory Visit | Attending: Internal Medicine | Admitting: Internal Medicine

## 2013-03-31 DIAGNOSIS — D449 Neoplasm of uncertain behavior of unspecified endocrine gland: Secondary | ICD-10-CM | POA: Diagnosis not present

## 2013-03-31 DIAGNOSIS — E041 Nontoxic single thyroid nodule: Secondary | ICD-10-CM | POA: Insufficient documentation

## 2013-03-31 DIAGNOSIS — E079 Disorder of thyroid, unspecified: Secondary | ICD-10-CM

## 2013-03-31 DIAGNOSIS — E0789 Other specified disorders of thyroid: Secondary | ICD-10-CM

## 2013-03-31 DIAGNOSIS — E042 Nontoxic multinodular goiter: Secondary | ICD-10-CM | POA: Diagnosis not present

## 2013-04-04 ENCOUNTER — Other Ambulatory Visit: Payer: Self-pay | Admitting: Internal Medicine

## 2013-04-04 ENCOUNTER — Telehealth: Payer: Self-pay | Admitting: Internal Medicine

## 2013-04-04 DIAGNOSIS — R911 Solitary pulmonary nodule: Secondary | ICD-10-CM

## 2013-04-04 NOTE — Telephone Encounter (Signed)
Reviewed pathology of thyroid FNA collected on 03/31/2013 which demonstrated benign follicular nodule in 1 of 2, and then the 2 of 2 with follicular lesion of undetermined significance.  We discussed that she would need a lung biopsy to further evaluate.  She has this scheduled for 3/04 by IR.

## 2013-04-04 NOTE — Telephone Encounter (Signed)
, °

## 2013-04-05 ENCOUNTER — Encounter (HOSPITAL_COMMUNITY): Payer: Self-pay | Admitting: Pharmacy Technician

## 2013-04-06 ENCOUNTER — Encounter: Payer: Self-pay | Admitting: Family Medicine

## 2013-04-06 ENCOUNTER — Ambulatory Visit (INDEPENDENT_AMBULATORY_CARE_PROVIDER_SITE_OTHER): Payer: Medicare Other | Admitting: Family Medicine

## 2013-04-06 VITALS — BP 138/62 | HR 87 | Temp 98.9°F | Ht 66.0 in | Wt 229.5 lb

## 2013-04-06 DIAGNOSIS — R7309 Other abnormal glucose: Secondary | ICD-10-CM

## 2013-04-06 DIAGNOSIS — R739 Hyperglycemia, unspecified: Secondary | ICD-10-CM

## 2013-04-06 DIAGNOSIS — J328 Other chronic sinusitis: Secondary | ICD-10-CM | POA: Diagnosis not present

## 2013-04-06 MED ORDER — AZITHROMYCIN 250 MG PO TABS: ORAL_TABLET | ORAL | Status: DC

## 2013-04-06 MED ORDER — HYDROCOD POLST-CHLORPHEN POLST 10-8 MG/5ML PO LQCR: 5.0000 mL | Freq: Two times a day (BID) | ORAL | Status: DC

## 2013-04-06 MED ORDER — CELECOXIB 200 MG PO CAPS: 200.0000 mg | ORAL_CAPSULE | Freq: Every day | ORAL | Status: DC | PRN

## 2013-04-06 NOTE — Progress Notes (Signed)
Subjective:    Patient ID: [name], female    DOB: 1940/05/13, 73 y.o.   MRN: [id number]  HPI Here for f/u of chronic illnesses  Since last visit had PET scan- showed inc activity in thyroid Thyroid bx - one area b9 and one area inconclusive Planning lung bx at this point- will get this on Wed   Wt is down 2 lb with bmi of 2 lb   tx for acute on chronic sinusitis Lab Results  Component Value Date   ESRSEDRATE 15 03/11/2013   did this due to temporal pain   Hospital Outpatient Visit on 03/21/2013  Component Date Value Ref Range Status  . Glucose-Capillary 03/21/2013 96  70 - 99 mg/dL Final   A1C is 6.2 overall stable  Her diet is improved  Not much of an appetite lately   Lab Results  Component Value Date   CHOL 208* 09/17/2012   HDL 49.40 09/17/2012   LDLCALC 105* 07/18/2009   LDLDIRECT 133.0 09/17/2012   TRIG 174.0* 09/17/2012   CHOLHDL 4 09/17/2012       Chemistry      Component Value Date/Time   NA 142 03/09/2013 1029   NA 140 09/17/2012 1323   K 4.2 03/09/2013 1029   K 4.3 09/17/2012 1323   CL 100 09/17/2012 1323   CL 105 07/14/2012 0939   CO2 28 03/09/2013 1029   CO2 30 09/17/2012 1323   BUN 8.5 03/09/2013 1029   BUN 14 09/17/2012 1323   CREATININE 0.7 03/09/2013 1029   CREATININE 0.8 09/17/2012 1323      Component Value Date/Time   CALCIUM 9.9 03/09/2013 1029   CALCIUM 9.5 09/17/2012 1323   CALCIUM 9.3 08/09/2009 0000   ALKPHOS 183* 03/09/2013 1029   ALKPHOS 82 09/17/2012 1323   AST 52* 03/09/2013 1029   AST 23 09/17/2012 1323   ALT 31 03/09/2013 1029   ALT 21 09/17/2012 1323   BILITOT 0.48 03/09/2013 1029   BILITOT 0.8 09/17/2012 1323      Just cannot get rid of this uri/ sinusitis  Has taken zpak  This is pretty much the only abx she can take that she is not allergic to or intolerant of --see med all list  Cough is severe and driving her crazy -req tussionex  Cough is dry  Had chills for 2 d-better since then  Her sinus pain on the L is overal better   Patient Active  Problem List   Diagnosis Date Noted  . Left temporal headache 03/11/2013  . Hyperglycemia 03/11/2013  . Acute sinusitis 02/09/2013  . Other malaise and fatigue 01/04/2013  . Herpes zoster 11/22/2012  . Vaginal pain 11/22/2012  . Cystocele 09/22/2012  . Encounter for Medicare annual wellness exam 09/17/2012  . Left knee pain 09/10/2012  . Thoracic back pain 06/01/2012  . Skin lesion of face 05/17/2012  . Other screening mammogram 07/22/2011  . Routine gynecological examination 07/22/2011  . Colon cancer screening 07/22/2011  . Screening for lipoid disorders 07/17/2011  . History of anemia 05/06/2011  . 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 Hypothyroid 02/24/2011  . Varicose veins 02/24/2011  . Elevated blood pressure 11/20/2010  . Obesity 11/20/2010  . 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 Other chronic sinusitis 07/03/2009  . THROAT PAIN, CHRONIC 07/03/2009  . GERD 12/20/2008  . Anxiety state, unspecified 08/22/2008  . MENOPAUSAL SYNDROME 07/13/2007  . HYPERCHOLESTEROLEMIA, PURE 07/08/2006  . ALLERGIC  RHINITIS 07/08/2006  . OVERACTIVE BLADDER 07/08/2006  . 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 DEPRESSION 06/25/2006  . PERIPHERAL VASCULAR DISEASE 06/25/2006  . OSTEOARTHRITIS 06/25/2006  . LEG EDEMA 06/25/2006  . SKIN CANCER, HX OF 06/25/2006  . COLONIC POLYPS, HX OF 06/25/2006   Past Medical History  Diagnosis Date  . Colon polyps   . Depression   . Hypothyroidism   . Osteoarthritis     hands  . Peripheral vascular disease   . Degenerative disk disease     spine in center in past   . Cancer     skin cancer- basal cell on arm / colon 2011  . Lymphoma   . Colon cancer   . 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 Cholecystectomy    . Splenectomy      lymphoma  . Breast biopsy  1996  . Plantar fascia release    . Ventral hernia repair  1998  .  Tendon release      Right thumb   . Colectomy  8/11  . Vaginal hysterectomy     History  Substance Use Topics  . Smoking status: Never Smoker   . Smokeless tobacco: Not on file  . Alcohol Use: No   Family History  Problem Relation Age of Onset  . Coronary artery disease Mother   . Alcohol abuse Mother   . Cancer Father     jaw  . Diabetes Brother   . Fibromyalgia Daughter     chronic pain   . COPD Daughter   . Colon cancer Neg Hx   . Colon polyps Neg Hx   . Stomach cancer Neg Hx    Allergies  Allergen Reactions  . Achromycin [Tetracycline Hcl]     Pt does not remember reaction  . Allopurinol     REACTION: Unsure of reaction happene years ago  . Astelin [Azelastine Hcl]     Unknown  . Cephalexin     REACTION: unsure of reaction happened yrs ago.  . Ciprofloxacin     REACTION: rash  . Codeine     REACTION: abd. pain  . Meloxicam     REACTION: GI symptoms  . Minocycline     Abdominal pain  . Nabumetone     REACTION: reaction not known  . Nyquil [Pseudoeph-Doxylamine-Dm-Apap] Hives  . Penicillins     REACTION: reaction not known  . Sulfa Antibiotics     Gi side eff   . Zolpidem Tartrate     REACTION: feels too drugged  . Buspar [Buspirone Hcl]     Dizziness, and not as effective for anxiety   Current Outpatient Prescriptions on File Prior to Visit  Medication Sig Dispense Refill  . Calcium Carbonate-Vitamin D (CALCIUM 600+D) 600-400 MG-UNIT per tablet Take 1 tablet by mouth daily.        . cholecalciferol (VITAMIN D) 1000 UNITS tablet Take 1,000 Units by mouth daily.        . fexofenadine (ALLEGRA) 180 MG tablet Take 1 tablet (180 mg total) by mouth daily.  90 tablet  3  . FLUoxetine (PROZAC) 20 MG capsule Take 20 mg by mouth daily.       Marland Kitchen levothyroxine (SYNTHROID, LEVOTHROID) 112 MCG tablet Take 1 tablet (112 mcg total) by mouth daily.  90 tablet  3  . NON FORMULARY once a week. ALLERGY SHOTS      . Omega-3 350 MG CAPS Take by mouth. Take  once capsule by  mouth once a day       . omeprazole (PRILOSEC) 40 MG capsule Take 40 mg by mouth 2 (two) times daily.       . psyllium (METAMUCIL) 58.6 % packet Take 1 packet by mouth daily.      . simvastatin (ZOCOR) 40 MG tablet Take 1 tablet (40 mg total) by mouth daily.  90 tablet  3  . vitamin E (VITAMIN E) 400 UNIT capsule Take 400 Units by mouth daily.         Current Facility-Administered Medications on File Prior to Visit  Medication Dose Route Frequency Provider Last Rate Last Dose  . 0.9 %  sodium chloride infusion  500 mL Intravenous Continuous Inda Castle, MD        Review of Systems Review of Systems  Constitutional: Negative for fever, appetite change, fatigue and unexpected weight change.  Eyes: Negative for pain and visual disturbance.  ENT pos for congestion and post nasal drip / pos for sinus pain that has improved Respiratory: Negative for  shortness of breath.  pos for persistent cough with occasional wheeze  Cardiovascular: Negative for cp or palpitations    Gastrointestinal: Negative for nausea, diarrhea and constipation.  Genitourinary: Negative for urgency and frequency.  Skin: Negative for pallor or rash   Neurological: Negative for weakness, light-headedness, numbness and headaches.  Hematological: Negative for adenopathy. Does not bruise/bleed easily.  Psychiatric/Behavioral: Negative for dysphoric mood. The patient is not nervous/anxious.         Objective:   Physical Exam  Constitutional: She appears well-developed and well-nourished. No distress.  obese and well appearing   HENT:  Head: Normocephalic and atraumatic.  Right Ear: External ear normal.  Left Ear: External ear normal.  Mouth/Throat: Oropharynx is clear and moist. No oropharyngeal exudate.  Nares are injected and congested  Very mild L maxillary sinus tenderness TMs clear  Throat clear with post nasal drip Clear rhinorrhea  Eyes: Conjunctivae and EOM are normal. Pupils are equal, round, and  reactive to light. Right eye exhibits no discharge. Left eye exhibits no discharge. No scleral icterus.  Neck: Normal range of motion. Neck supple. No thyromegaly present.  Cardiovascular: Normal rate and regular rhythm.   Pulmonary/Chest: Effort normal. No respiratory distress. She has wheezes. She has no rales. She exhibits no tenderness.  Harsh bs diffusely with some rhonchi at bases  No rales  Scant wheeze with forced exp only  No prolonged exp phase   Lymphadenopathy:    She has no cervical adenopathy.  Neurological: She is alert. She has normal reflexes.  Skin: Skin is warm and dry. No rash noted. No erythema. No pallor.  Psychiatric: She has a normal mood and affect.          Assessment & Plan:

## 2013-04-06 NOTE — Progress Notes (Signed)
Pre visit review using our clinic review tool, if applicable. No additional management support is needed unless otherwise documented below in the visit note. 

## 2013-04-06 NOTE — Patient Instructions (Addendum)
Call back after your lung biopsy and oncology follow up - for referral to dermatology for mole on chest  Take another zpack for bronchitis  Use tussionex as directed for cough-watch out for sedation   Here is a px for brand name celebrex

## 2013-04-07 ENCOUNTER — Other Ambulatory Visit: Payer: Self-pay | Admitting: Radiology

## 2013-04-07 ENCOUNTER — Ambulatory Visit: Payer: Medicare Other

## 2013-04-07 NOTE — Assessment & Plan Note (Signed)
This is overall stable Lab Results  Component Value Date   HGBA1C 6.2 03/11/2013    Rev goals for lower sugar diet and wt loss to prevent DM

## 2013-04-07 NOTE — Assessment & Plan Note (Signed)
Improved but not resolved Will repeat zpak -since pt is allergic/ intol to almost all other abx oral med classes  Disc symptomatic care - see instructions on AVS  (Tussionex for cough )  Update if not starting to improve in a week or if worsening

## 2013-04-13 ENCOUNTER — Ambulatory Visit (HOSPITAL_COMMUNITY): Admission: RE | Admit: 2013-04-13 | Payer: Medicare Other | Source: Ambulatory Visit

## 2013-04-18 ENCOUNTER — Other Ambulatory Visit: Payer: Self-pay | Admitting: Radiology

## 2013-04-19 ENCOUNTER — Telehealth: Payer: Self-pay | Admitting: Internal Medicine

## 2013-04-19 NOTE — Telephone Encounter (Signed)
Pt called and wants to schedule appt with MD after biopsy, left message with nurse regarding appt on 3/23

## 2013-04-20 ENCOUNTER — Encounter: Payer: Self-pay | Admitting: Internal Medicine

## 2013-04-20 ENCOUNTER — Telehealth: Payer: Self-pay | Admitting: Medical Oncology

## 2013-04-20 ENCOUNTER — Ambulatory Visit (INDEPENDENT_AMBULATORY_CARE_PROVIDER_SITE_OTHER): Payer: Medicare Other | Admitting: Internal Medicine

## 2013-04-20 ENCOUNTER — Telehealth: Payer: Self-pay | Admitting: Family Medicine

## 2013-04-20 VITALS — BP 118/72 | HR 87 | Temp 98.5°F | Ht 66.0 in | Wt 233.4 lb

## 2013-04-20 DIAGNOSIS — J019 Acute sinusitis, unspecified: Secondary | ICD-10-CM | POA: Diagnosis not present

## 2013-04-20 DIAGNOSIS — J329 Chronic sinusitis, unspecified: Secondary | ICD-10-CM

## 2013-04-20 DIAGNOSIS — J328 Other chronic sinusitis: Secondary | ICD-10-CM

## 2013-04-20 MED ORDER — CLARITHROMYCIN 500 MG PO TABS: 500.0000 mg | ORAL_TABLET | Freq: Two times a day (BID) | ORAL | Status: DC

## 2013-04-20 NOTE — Progress Notes (Signed)
Pre visit review using our clinic review tool, if applicable. No additional management support is needed unless otherwise documented below in the visit note. 

## 2013-04-20 NOTE — Telephone Encounter (Signed)
Pt called and states that she has had a sinus infection for about 4 months. She has taken a z pak  4 times and she is still sick. She has a biopsy in the am is not sure what to do. I asked her if she has seen an ENT and she states no. I encouraged her to call her primary and get a referral. She states she allergic to so many medications it is hard to treat her. She is worried about being put to sleep for her biopsy.  I explained that they will sedate her but not put her to sleep. She voiced understanding and plans on doing the biopsy tomorrow.

## 2013-04-20 NOTE — Telephone Encounter (Signed)
Patient Information:  Caller Name: Diamond Boyd  Phone: [phone]  Patient: 06/25/1940, [place] Mo  Gender: Female  DOB: 1940-08-03  Age: 73 Years  PCP: [name] [facility])  Office Follow Up:  Does the office need to follow up with this patient?: No  Instructions For The Office: N/A   Symptoms  Reason For Call & Symptoms: Gets frequent sinus infections and took Zpack on for Bronchitis- finished one week ago.. Cheek swollen on L, Headache behind L eye, and gums hurting on L and L ear hurting. Afebrile. Taking Tylenol 250 mg 2 tabs PO every 4 hours- last taken at 11:00 today. Occasional dry cough, drainage in throat and throat irritated on L side.  Reviewed Health History In EMR: Yes  Reviewed Medications In EMR: Yes  Reviewed Allergies In EMR: Yes  Reviewed Surgeries / Procedures: Yes  Date of Onset of Symptoms: 04/19/2013  Guideline(s) Used:  Sinus Pain and Congestion  Disposition Per Guideline:   See Today in Office  Reason For Disposition Reached:   Earache  Advice Given:  Reassurance:   Sinus congestion is a normal part of a cold.  Usually home treatment with nasal washes can prevent an actual bacterial sinus infection.  Antibiotics are not helpful for the sinus congestion that occurs with colds.  For a Runny Nose With Profuse Discharge:  Nasal mucus and discharge helps to wash viruses and bacteria out of the nose and sinuses.  Blowing the nose is all that is needed.  For a Stuffy Nose - Use Nasal Washes:  Introduction: Saline (salt water) nasal irrigation (nasal wash) is an effective and simple home remedy for treating stuffy nose and sinus congestion. The nose can be irrigated by pouring, spraying, or squirting salt water into the nose and then letting it run back out.  How it Helps: The salt water rinses out excess mucus, washes out any irritants (dust, allergens) that might be present, and moistens the nasal cavity.  Methods: There are several ways to perform nasal  irrigation. You can use a saline nasal spray bottle (available over-the-counter), a rubber ear syringe, a medical syringe without the needle, or a Neti Pot.  Pain and Fever Medicines:  For pain or fever relief, take either acetaminophen or ibuprofen.  They are over-the-counter (OTC) drugs that help treat both fever and pain. You can buy them at the drugstore.  Expected Course:  Sinus congestion from viral upper respiratory infections (colds) usually lasts 5-10 days.  Occasionally a cold can worsen and turn into bacterial sinusitis. Clues to this are sinus symptoms lasting longer than 10 days, fever lasting longer than 3 days, and worsening pain. Bacterial sinusitis may need antibiotic treatment.  Call Back If:   Severe pain lasts longer than 2 hours after pain medicine  Sinus pain lasts longer than 1 day after starting treatment using nasal washes  Sinus congestion (fullness) lasts longer than 10 days  Fever lasts longer than 3 days  You become worse.  Patient Will Follow Care Advice:  YES  Appointment Scheduled:  04/20/2013 18:00:00 Appointment Scheduled Provider:  Other

## 2013-04-20 NOTE — Progress Notes (Signed)
Subjective:    Patient ID: [name], female    DOB: Jun 06, 1940, 73 y.o.   MRN: [id number]  HPI   Here with 2-3 days acute onset fever, facial pain, pressure, headache, general weakness and malaise, and greenish d/c, with mild ST and cough, but pt denies chest pain, wheezing, increased sob or doe, orthopnea, PND, increased LE swelling, palpitations, dizziness or syncope.  States this would be 5th episode in last 3 mo.  No recent CT. States she is unable to re-start allergy shot tx until sinus symptoms improved.  Has recurring right nasal bleeding occasionally as well. Past Medical History  Diagnosis Date  . Colon polyps   . Depression   . Hypothyroidism   . Osteoarthritis     hands  . Peripheral vascular disease   . Degenerative disk disease     spine in center in past   . Cancer     skin cancer- basal cell on arm / colon 2011  . Lymphoma   . Colon cancer   . 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 Cholecystectomy    . Splenectomy      lymphoma  . Breast biopsy  1996  . Plantar fascia release    . Ventral hernia repair  1998  . Tendon release      Right thumb   . Colectomy  8/11  . Vaginal hysterectomy      reports that she has never smoked. She does not have any smokeless tobacco history on file. She reports that she does not drink alcohol or use illicit drugs. family history includes Alcohol abuse in her mother; COPD in her daughter; Cancer in her father; Coronary artery disease in her mother; Diabetes in her brother; Fibromyalgia in her daughter. There is no history of Colon cancer, Colon polyps, or Stomach cancer. Allergies  Allergen Reactions  . Achromycin [Tetracycline Hcl]     Pt does not remember reaction  . Allopurinol     REACTION: Unsure of reaction happene years ago  . Astelin [Azelastine Hcl]     Unknown  . Cephalexin     REACTION: unsure of reaction happened yrs ago.  . Ciprofloxacin     REACTION: rash   . Codeine     REACTION: abd. pain  . Meloxicam     REACTION: GI symptoms  . Minocycline     Abdominal pain  . Nabumetone     REACTION: reaction not known  . Nyquil [Pseudoeph-Doxylamine-Dm-Apap] Hives  . Penicillins     REACTION: reaction not known  . Sulfa Antibiotics     Gi side eff   . Zolpidem Tartrate     REACTION: feels too drugged  . Buspar [Buspirone Hcl]     Dizziness, and not as effective for anxiety   Current Outpatient Prescriptions on File Prior to Visit  Medication Sig Dispense Refill  . azithromycin (ZITHROMAX Z-PAK) 250 MG tablet Take 2 pills by mouth today and then 1 pill daily for 4 days  6 each  0  . Calcium Carbonate-Vitamin D (CALCIUM 600+D) 600-400 MG-UNIT per tablet Take 1 tablet by mouth daily.        . celecoxib (CELEBREX) 200 MG capsule Take 1 capsule (200 mg total) by mouth daily as needed.  90 capsule  3  . chlorpheniramine-HYDROcodone (TUSSIONEX) 10-8 MG/5ML LQCR Take 5 mLs by mouth 2 (two) times daily. As needed for cough  120 mL  0  .  cholecalciferol (VITAMIN D) 1000 UNITS tablet Take 1,000 Units by mouth daily.        Marland Kitchen estradiol (ESTRACE) 2 MG tablet Take 1 mg by mouth 2 (two) times daily.      . fexofenadine (ALLEGRA) 180 MG tablet Take 1 tablet (180 mg total) by mouth daily.  90 tablet  3  . FLUoxetine (PROZAC) 20 MG capsule Take 20 mg by mouth daily.       . fluticasone (FLONASE) 50 MCG/ACT nasal spray Place 2 sprays into the nose 2 (two) times daily. 1 spray in each nostril twice daily      . levothyroxine (SYNTHROID, LEVOTHROID) 112 MCG tablet Take 1 tablet (112 mcg total) by mouth daily.  90 tablet  3  . NON FORMULARY once a week. ALLERGY SHOTS      . Omega-3 350 MG CAPS Take by mouth. Take once capsule by mouth once a day       . omeprazole (PRILOSEC) 40 MG capsule Take 40 mg by mouth 2 (two) times daily.       . psyllium (METAMUCIL) 58.6 % packet Take 1 packet by mouth daily.      . simvastatin (ZOCOR) 40 MG tablet Take 1 tablet (40 mg  total) by mouth daily.  90 tablet  3  . vitamin E (VITAMIN E) 400 UNIT capsule Take 400 Units by mouth daily.         Current Facility-Administered Medications on File Prior to Visit  Medication Dose Route Frequency Provider Last Rate Last Dose  . 0.9 %  sodium chloride infusion  500 mL Intravenous Continuous Inda Castle, MD        Review of Systems  Constitutional: Negative for unexpected weight change, or unusual diaphoresis  HENT: Negative for tinnitus.   Eyes: Negative for photophobia and visual disturbance.  Respiratory: Negative for choking and stridor.   Gastrointestinal: Negative for vomiting and blood in stool.  Genitourinary: Negative for hematuria and decreased urine volume.  Musculoskeletal: Negative for acute joint swelling Skin: Negative for color change and wound.  Neurological: Negative for tremors and numbness other than noted  Psychiatric/Behavioral: Negative for decreased concentration or  hyperactivity.       Objective:   Physical Exam BP 118/72  Pulse 87  Temp(Src) 98.5 F (36.9 C) (Oral)  Ht 5\' 6"  (1.676 m)  Wt 233 lb 6 oz (105.858 kg)  BMI 37.69 kg/m2  SpO2 95%  LMP 02/10/1970 VS noted, mild ill Constitutional: Pt appears well-developed and well-nourished.  HENT: Head: NCAT.  Right Ear: External ear normal.  Left Ear: External ear normal.  Eyes: Conjunctivae and EOM are normal. Pupils are equal, round, and reactive to light.  Bilat tm's with mild erythema.  Max sinus areas mild tender left > right.  Pharynx with mild erythema, no exudate Neck: Normal range of motion. Neck supple.  Cardiovascular: Normal rate and regular rhythm.   Pulmonary/Chest: Effort normal and breath sounds normal.  Neurological: Pt is alert. Not confused  Skin: Skin is warm. No erythema.  Psychiatric: Pt behavior is normal. Thought content normal.     Assessment & Plan:

## 2013-04-20 NOTE — Assessment & Plan Note (Signed)
For re-start allergy tx asap,  to f/u any worsening symptoms or concerns

## 2013-04-20 NOTE — Assessment & Plan Note (Signed)
Acute on chronic, Mild to mod, for antibx course,  to f/u any worsening symptoms or concerns, for CT sinus, consider ENT eval

## 2013-04-20 NOTE — Patient Instructions (Signed)
Please take all new medication as prescribed - the antibiotic (best to take with food)  You will be contacted regarding the referral for: CT scan for sinus  Please call [phone] (for [facility] or [facility]) if you do not hear soon about the sinus  Please continue all other medications as before, and refills have been done if requested. Please have the pharmacy call with any other refills you may need.

## 2013-04-21 ENCOUNTER — Ambulatory Visit (HOSPITAL_COMMUNITY): Admission: RE | Admit: 2013-04-21 | Payer: Medicare Other | Source: Ambulatory Visit

## 2013-04-21 ENCOUNTER — Telehealth: Payer: Self-pay

## 2013-04-21 NOTE — Telephone Encounter (Deleted)
Called pt back. S/w husband, asked them to call us back when antibiotics are mostly completed and she is feeling better and has been fever free for several days. We will need to re order the CT Biopsy at that time.

## 2013-04-21 NOTE — Telephone Encounter (Addendum)
Returning pt call. Pt is letting us know she could not have her CT biopsy done this AM because she is running a fever. She saw Dr Sherren Mocha this AM. He diagnosed a sinus infection and prescribed some antibiotics. Dr Juliann Mule made aware. Called pt back asking her to call us when feeling better and been fever free for several days. We will need to reorder CT biopsy.

## 2013-04-23 ENCOUNTER — Emergency Department (HOSPITAL_COMMUNITY): Payer: Medicare Other

## 2013-04-23 ENCOUNTER — Encounter (HOSPITAL_COMMUNITY): Payer: Self-pay | Admitting: Emergency Medicine

## 2013-04-23 ENCOUNTER — Emergency Department (HOSPITAL_COMMUNITY)
Admission: EM | Admit: 2013-04-23 | Discharge: 2013-04-23 | Disposition: A | Discharge status: Home / Self Care | Payer: Medicare Other | Attending: Emergency Medicine | Admitting: Emergency Medicine

## 2013-04-23 DIAGNOSIS — Z79899 Other long term (current) drug therapy: Secondary | ICD-10-CM | POA: Diagnosis not present

## 2013-04-23 DIAGNOSIS — R918 Other nonspecific abnormal finding of lung field: Secondary | ICD-10-CM

## 2013-04-23 DIAGNOSIS — Z860100 Personal history of colon polyps, unspecified: Secondary | ICD-10-CM | POA: Insufficient documentation

## 2013-04-23 DIAGNOSIS — J984 Other disorders of lung: Secondary | ICD-10-CM | POA: Diagnosis not present

## 2013-04-23 DIAGNOSIS — Z85038 Personal history of other malignant neoplasm of large intestine: Secondary | ICD-10-CM | POA: Insufficient documentation

## 2013-04-23 DIAGNOSIS — R509 Fever, unspecified: Secondary | ICD-10-CM | POA: Insufficient documentation

## 2013-04-23 DIAGNOSIS — Z792 Long term (current) use of antibiotics: Secondary | ICD-10-CM | POA: Diagnosis not present

## 2013-04-23 DIAGNOSIS — Z85828 Personal history of other malignant neoplasm of skin: Secondary | ICD-10-CM | POA: Diagnosis not present

## 2013-04-23 DIAGNOSIS — R911 Solitary pulmonary nodule: Secondary | ICD-10-CM | POA: Diagnosis not present

## 2013-04-23 DIAGNOSIS — Z87898 Personal history of other specified conditions: Secondary | ICD-10-CM | POA: Insufficient documentation

## 2013-04-23 DIAGNOSIS — F3289 Other specified depressive episodes: Secondary | ICD-10-CM | POA: Diagnosis not present

## 2013-04-23 DIAGNOSIS — IMO0002 Reserved for concepts with insufficient information to code with codable children: Secondary | ICD-10-CM | POA: Insufficient documentation

## 2013-04-23 DIAGNOSIS — R0602 Shortness of breath: Secondary | ICD-10-CM | POA: Diagnosis not present

## 2013-04-23 DIAGNOSIS — M19049 Primary osteoarthritis, unspecified hand: Secondary | ICD-10-CM | POA: Diagnosis not present

## 2013-04-23 DIAGNOSIS — R0682 Tachypnea, not elsewhere classified: Secondary | ICD-10-CM | POA: Insufficient documentation

## 2013-04-23 DIAGNOSIS — E039 Hypothyroidism, unspecified: Secondary | ICD-10-CM | POA: Insufficient documentation

## 2013-04-23 DIAGNOSIS — F329 Major depressive disorder, single episode, unspecified: Secondary | ICD-10-CM | POA: Diagnosis not present

## 2013-04-23 LAB — PRO B NATRIURETIC PEPTIDE: Pro B Natriuretic peptide (BNP): 760.3 pg/mL — ABNORMAL HIGH (ref 0–125)

## 2013-04-23 LAB — CBC
HEMATOCRIT: 36.6 % (ref 36.0–46.0)
HEMOGLOBIN: 13.2 g/dL (ref 12.0–15.0)
MCH: 30.6 pg (ref 26.0–34.0)
MCHC: 36.1 g/dL — AB (ref 30.0–36.0)
MCV: 84.9 fL (ref 78.0–100.0)
Platelets: 195 10*3/uL (ref 150–400)
RBC: 4.31 MIL/uL (ref 3.87–5.11)
RDW: 16.2 % — AB (ref 11.5–15.5)
WBC: 9.7 10*3/uL (ref 4.0–10.5)

## 2013-04-23 LAB — BASIC METABOLIC PANEL
BUN: 11 mg/dL (ref 6–23)
CHLORIDE: 98 meq/L (ref 96–112)
CO2: 22 meq/L (ref 19–32)
CREATININE: 0.6 mg/dL (ref 0.50–1.10)
Calcium: 9.4 mg/dL (ref 8.4–10.5)
GFR calc Af Amer: >90 mL/min (ref >90)
GFR calc non Af Amer: 88 mL/min — ABNORMAL LOW (ref >90)
GLUCOSE: 92 mg/dL (ref 70–99)
POTASSIUM: 4.6 meq/L (ref 3.7–5.3)
Sodium: 137 mEq/L (ref 137–147)

## 2013-04-23 LAB — BLOOD GAS, ARTERIAL
Acid-Base Excess: 1 mmol/L (ref 0.0–2.0)
BICARBONATE: 22.6 meq/L (ref 20.0–24.0)
Drawn by: 307971
FIO2: 0.21 %
O2 Saturation: 94.7 %
PCO2 ART: 28.2 mmHg — AB (ref 35.0–45.0)
PH ART: 7.515 — AB (ref 7.350–7.450)
PO2 ART: 69.8 mmHg — AB (ref 80.0–100.0)
Patient temperature: 98.6
TCO2: 19.5 mmol/L (ref 0–100)

## 2013-04-23 LAB — I-STAT TROPONIN, ED: Troponin i, poc: 0.02 ng/mL (ref 0.00–0.08)

## 2013-04-23 LAB — D-DIMER, QUANTITATIVE: D-Dimer, Quant: 2.76 ug/mL-FEU — ABNORMAL HIGH (ref 0.00–0.48)

## 2013-04-23 MED ORDER — IOHEXOL 300 MG/ML  SOLN: 50.0000 mL | Freq: Once | INTRAMUSCULAR | Status: DC | PRN

## 2013-04-23 MED ORDER — IOHEXOL 350 MG/ML SOLN
100.0000 mL | Freq: Once | INTRAVENOUS | Status: AC | PRN
  Administered 2013-04-23: 100 mL via INTRAVENOUS

## 2013-04-23 NOTE — ED Provider Notes (Signed)
CSN: [id number]     Arrival date & time 04/23/13  1058 History   First MD Initiated Contact with Patient 04/23/13 1105     Chief Complaint  Patient presents with  . Shortness of Breath  . Fever    HPI Patient presents to the emergency room with complaints of shortness of breath and fever.  She states over the last few weeks she has been having trouble with congestion and cough. She has seen her primary Dr. and has been diagnosed with bronchitis. Patient states she has taken several doses azithromycin. This past week she saw her doctor again because of persistent symptoms and was given a primary another prescription for antibiotics. Patient states this morning she woke up and had temperature of either 100 or 101. She took some Tylenol. She also felt very short of breath. She denies any chest pain. She denies any leg swelling.  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 She has not been formally diagnosed with his [place] cancer is not currently undergoing any chemotherapy treatments the Past Medical History  Diagnosis Date  . Colon polyps   . Depression   . Hypothyroidism   . Osteoarthritis     hands  . Peripheral vascular disease   . Degenerative disk disease     spine in center in past   . 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 Cancer     skin cancer- basal cell on arm / colon 2011  . Lymphoma   . Colon cancer    Past Surgical History  Procedure Laterality Date  . Cholecystectomy    . Splenectomy      lymphoma  . Breast biopsy  1996  . Plantar fascia release    . Ventral hernia repair  1998  . Tendon release      Right thumb   . Colectomy  8/11  . Vaginal hysterectomy     Family History  Problem Relation Age of Onset  . Coronary artery disease Mother   . Alcohol abuse Mother   . Cancer Father     jaw  . Diabetes Brother   . Fibromyalgia Daughter     chronic pain   . COPD Daughter   . Colon cancer Neg Hx   . Colon polyps  Neg Hx   . Stomach cancer Neg Hx    History  Substance Use Topics  . Smoking status: Never Smoker   . Smokeless tobacco: Not on file  . Alcohol Use: No   OB History   Grav Para Term Preterm Abortions TAB SAB Ect Mult Living                 Review of Systems  All other systems reviewed and are negative.      Allergies  Achromycin; Allopurinol; Astelin; Cephalexin; Codeine; Meloxicam; Minocycline; Nabumetone; Nyquil; Penicillins; Sulfa antibiotics; Zolpidem tartrate; Buspar; and Ciprofloxacin  Home Medications   Current Outpatient Rx  Name  Route  Sig  Dispense  Refill  . acetaminophen (TYLENOL) 500 MG tablet   Oral   Take 1,000 mg by mouth every 6 (six) hours as needed.         . benzonatate (TESSALON) 200 MG capsule   Oral   Take 200 mg by mouth 4 (four) times daily as needed for cough.         . Calcium Carbonate-Vitamin D (CALCIUM 600+D) 600-400 MG-UNIT per tablet   Oral   Take 1  tablet by mouth daily.           . celecoxib (CELEBREX) 200 MG capsule   Oral   Take 1 capsule (200 mg total) by mouth daily as needed.   90 capsule   3     Dispense as written.   . chlorpheniramine-HYDROcodone (TUSSIONEX) 10-8 MG/5ML LQCR   Oral   Take 5 mLs by mouth 2 (two) times daily. As needed for cough   120 mL   0   . cholecalciferol (VITAMIN D) 1000 UNITS tablet   Oral   Take 1,000 Units by mouth daily.           . clarithromycin (BIAXIN) 500 MG tablet   Oral   Take 1 tablet (500 mg total) by mouth 2 (two) times daily.   28 tablet   0   . estradiol (ESTRACE) 2 MG tablet   Oral   Take 1 mg by mouth daily.          . fexofenadine (ALLEGRA) 180 MG tablet   Oral   Take 1 tablet (180 mg total) by mouth daily.   90 tablet   3   . FLUoxetine (PROZAC) 20 MG capsule   Oral   Take 20 mg by mouth daily.          . fluticasone (FLONASE) 50 MCG/ACT nasal spray   Nasal   Place 1 spray into the nose 2 (two) times daily.          Marland Kitchen levothyroxine  (SYNTHROID, LEVOTHROID) 112 MCG tablet   Oral   Take 1 tablet (112 mcg total) by mouth daily.   90 tablet   3   . NON FORMULARY      once a week. ALLERGY SHOTS         . Omega-3 350 MG CAPS   Oral   Take 1 capsule by mouth daily.          Marland Kitchen omeprazole (PRILOSEC) 40 MG capsule   Oral   Take 40 mg by mouth 2 (two) times daily.          . psyllium (METAMUCIL) 58.6 % packet   Oral   Take 1-3 packets by mouth daily.          . simvastatin (ZOCOR) 40 MG tablet   Oral   Take 1 tablet (40 mg total) by mouth daily.   90 tablet   3   . vitamin E (VITAMIN E) 400 UNIT capsule   Oral   Take 400 Units by mouth daily.            BP 123/57  Pulse 85  Temp(Src) 99.8 F (37.7 C) (Oral)  Resp 24  Ht 5\' 6"  (1.676 m)  Wt 231 lb (104.781 kg)  BMI 37.30 kg/m2  SpO2 93%  LMP 02/10/1970 Physical Exam  Nursing note and vitals reviewed. Constitutional: She appears well-developed and well-nourished. No distress.  HENT:  Head: Normocephalic and atraumatic.  Right Ear: External ear normal.  Left Ear: External ear normal.  Eyes: Conjunctivae are normal. Right eye exhibits no discharge. Left eye exhibits no discharge. No scleral icterus.  Neck: Neck supple. No tracheal deviation present.  Cardiovascular: Normal rate, regular rhythm and intact distal pulses.   Pulmonary/Chest: Breath sounds normal. No stridor. No respiratory distress. She has no wheezes. She has no rales.  tachypneic  Abdominal: Soft. Bowel sounds are normal. She exhibits no distension. There is no tenderness. There is no rebound and no guarding.  Musculoskeletal: She exhibits no edema and no tenderness.  Neurological: She is alert. She has normal strength. No cranial nerve deficit (no facial droop, extraocular movements intact, no slurred speech) or sensory deficit. She exhibits normal muscle tone. She displays no seizure activity. Coordination normal.  Skin: Skin is warm and dry. No rash noted.  Psychiatric: She  has a normal mood and affect.    ED Course  Procedures (including critical care time) Labs Review Labs Reviewed  BASIC METABOLIC PANEL - Abnormal; Notable for the following:    GFR calc non Af Amer 88 (*)    All other components within normal limits  CBC - Abnormal; Notable for the following:    MCHC 36.1 (*)    RDW 16.2 (*)    All other components within normal limits  D-DIMER, QUANTITATIVE - Abnormal; Notable for the following:    D-Dimer, Quant 2.76 (*)    All other components within normal limits  PRO B NATRIURETIC PEPTIDE - Abnormal; Notable for the following:    Pro B Natriuretic peptide (BNP) 760.3 (*)    All other components within normal limits  BLOOD GAS, ARTERIAL - Abnormal; Notable for the following:    pH, Arterial 7.515 (*)    pCO2 arterial 28.2 (*)    pO2, Arterial 69.8 (*)    All other components within normal limits  I-STAT TROPOININ, ED   Imaging Review Dg Chest 2 View  04/23/2013   CLINICAL DATA:  Shortness of breath and fever  EXAM: CHEST  2 VIEW  COMPARISON:  03/21/2013  FINDINGS: Cardiac shadow is within normal limits. Pulmonary nodules are noted which have grown in the interval from the prior exam. The largest of these now measures approximately 2.4 cm. No acute infiltrate or sizable effusion is seen. Degenerative changes of the thoracic spine are noted.  IMPRESSION: Multiple pulmonary nodules. The largest of these lies within the right mid lung measuring 2.4 cm.   Electronically Signed   By: Inez Catalina M.D.   On: 04/23/2013 11:47   Ct Angio Chest W/cm &/or Wo Cm  04/23/2013   CLINICAL DATA:  Shortness of breath and fever; history of lymphoma  EXAM: CT ANGIOGRAPHY CHEST WITH CONTRAST  TECHNIQUE: Multidetector CT imaging of the chest was performed using the standard protocol during bolus administration of intravenous contrast. Multiplanar CT image reconstructions and MIPs were obtained to evaluate the vascular anatomy.  CONTRAST:  168mL OMNIPAQUE IOHEXOL 350  MG/ML SOLN  COMPARISON:  Chest CT September 29, 2009 and chest radiograph April 23, 2013 ; PET-CT March 21, 2013  FINDINGS: There is no demonstrable pulmonary embolus. There is no thoracic aortic aneurysm or dissection.  There are multiple nodular lesions throughout the lungs. The largest nodular lesion is in the posterior segment of the right upper lobe near the major fissure measuring 2.5 x 2.4 cm. Nodular opacity scattered elsewhere throughout the lungs range in size from as small as 3 mm to as large as 14. x 1.0 cm. Nodular lesions are noted to varying degrees and most lobes in segments of the lungs bilaterally with a slightly greater preponderance of nodular opacities in the lower lobes compared elsewhere.  There is no lung edema or consolidation.  There is a mildly prominent lymph node in the right hilum measuring 2.0 by 1.9 cm. There is a sub- carinal lymph node measuring 2.2 x 1.6 cm. There are scattered subcentimeter mediastinal lymph nodes.  There are scattered foci of coronary artery calcification. Pericardium is not thickened.  Visualized upper abdominal structures appear normal. There are no blastic or lytic bone lesions. There is degenerative change in the thoracic spine. There is enlargement of the left lobe of the thyroid compared to the right, as well as a focal mass in the left lobe of the thyroid, noted on recent PET examination.  Review of the MIP images confirms the above findings.  IMPRESSION: No demonstrable pulmonary embolus. Multiple nodular opacities in the lungs, marginally increased from recent prior PET examination. Mild adenopathy. Mass in left lobe of thyroid. 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   Electronically Signed   By: Lowella Grip M.D.   On: 04/23/2013 14:14     EKG Interpretation   Date/Time:  Saturday April 23 2013 11:13:36 EDT Ventricular Rate:  85 PR Interval:  165 QRS Duration: 126 QT Interval:  444 QTC Calculation: 528 R Axis:   65 Text  Interpretation:  Sinus arrhythmia IVCD, consider atypical LBBB No  significant change since last tracing Confirmed by Rhen Dossantos  MD-J, Guillermo Difrancesco  ([id number]) on 04/23/2013 11:20:00 AM     Reviewed recent office notes.    Recent pet scan showed active lesions in thyroid and lung.  Biopsy of thyroid did not show malignancy.  Pt is pending biopsy of lung nodules.  ABG consistent with hyperventilation with a component of  MDM   Final diagnoses:  Pulmonary nodules    CT scan does not show PE or PNA.  Several pulmonary nodules noted concerning for malignancy. Discussed these findings with the patient.  Suspect that her fevers and home and dyspnea may be related to her probable malignancy.  Explained importance of following up with her biopsy.  Pt feels better and is comfortable going home.  Oxygen sat is in mid 90s on room air.  Spoke with Dr Osker Mason who will notify Dr Juliann Mule.    Kathalene Frames, MD 04/23/13 [phone]

## 2013-04-23 NOTE — Discharge Instructions (Signed)
Pulmonary Nodule A pulmonary nodule is a small, round growth of tissue in the lung. Pulmonary nodules can range in size from less than 1/5 inch (4 mm) to a little bigger than an inch (25 mm). Most pulmonary nodules are detected when imaging tests of the lung are being performed for a different problem. Pulmonary nodules are usually not cancerous (benign). However, some pulmonary nodules are cancerous (malignant). Follow-up treatment or testing is based on the size of the pulmonary nodule and your risk of getting lung cancer.  CAUSES Benign pulmonary nodules can be caused by various things. Some of the causes include:   Bacterial, fungal, or viral infections. This is usually an old infection that is no longer active, but it can sometimes be a current, active infection.  A benign mass of tissue.  Inflammation from conditions such as rheumatoid arthritis.   Abnormal blood vessels in the lungs. Malignant pulmonary nodules can result from lung cancer or from cancers that spread to the lung from other places in the body. SIGNS AND SYMPTOMS Pulmonary nodules usually do not cause symptoms. DIAGNOSIS Most often, pulmonary nodules are found incidentally when an X-ray or CT scan is performed to look for some other problem in the lung area. To help determine whether a pulmonary nodule is benign or malignant, your health care provider will take a medical history and order a variety of tests. Tests done may include:   Blood tests.  A skin test called a tuberculin test. This test is used to determine if you have been exposed to the germ that causes tuberculosis.   Chest X-rays. If possible, a new X-ray may be compared with X-rays you have had in the past.   CT scan. This test shows smaller pulmonary nodules more clearly than an X-ray.   Positron emission tomography (PET) scan. In this test, a safe amount of a radioactive substance is injected into the bloodstream. Then, the scan takes a picture of  the pulmonary nodule. The radioactive substance is eliminated from your body in your urine.   Biopsy. A tiny piece of the pulmonary nodule is removed so it can be checked under a microscope. TREATMENT  Pulmonary nodules that are benign normally do not require any treatment because they usually do not cause symptoms or breathing problems. Your health care provider may want to monitor the pulmonary nodule through follow-up CT scans. The frequency of these CT scans will vary based on the size of the nodule and the risk factors for lung cancer. For example, CT scans will need to be done more frequently if the pulmonary nodule is larger and if you have a history of smoking and a family history of cancer. Further testing or biopsies may be done if any follow-up CT scan shows that the size of the pulmonary nodule has increased. HOME CARE INSTRUCTIONS  Only take over-the-counter or prescription medicines as directed by your health care provider.  Keep all follow-up appointments with your health care provider. SEEK MEDICAL CARE IF:  You have trouble breathing when you are active.   You feel sick or unusually tired.   You do not feel like eating.   You lose weight without trying to.   You develop chills or night sweats.  SEEK IMMEDIATE MEDICAL CARE IF:  You cannot catch your breath, or you begin wheezing.   You cannot stop coughing.   You cough up blood.   You become dizzy or feel like you are going to pass out.   You  have sudden chest pain.   You have a fever or persistent symptoms for more than 2 3 days.   You have a fever and your symptoms suddenly get worse. MAKE SURE YOU:  Understand these instructions.  Will watch your condition.  Will get help right away if you are not doing well or get worse. Document Released: 11/24/2008 Document Revised: 09/29/2012 Document Reviewed: 07/19/2012 [facility] Patient Information 2014 [place].

## 2013-04-23 NOTE — ED Notes (Signed)
Pt reports having 3 days worth of fever, 100.6 this am at home. Has taken tylenol at 0830. C/o waking up with sob. Pt speaking in complete sentences.

## 2013-04-23 NOTE — ED Notes (Signed)
Pt comes from home where she lives with her husband.  Pt has hx of sinus infection, body aches/pains and fever for several weeks.  Pt states this morning she woke up feeling SOB with a heaviness in her chest. That prompted her to come to ED.  Pt has been seen by PCP and oncologist.  Pt found to have 3 nodules in right lung in January.  Pt is supposed to have biopsy - but that has been delayed because pt has had ongoing fever.  Pt's fever is responsive to antipyretics.  Pt denies N/V, but endorses diarrhea.  Pt's lung sounds are clear.  Good peripheral pulses palpated.  No peripheral edema.  Pt states she has been having pain in right leg from thigh to foot - worse from knee to foot.  Pt states her PCP told her the pain is likely related to her fever (?).  Pt become SOB with speaking, but does not believe movement or exertion makes it worse.

## 2013-04-25 ENCOUNTER — Other Ambulatory Visit: Payer: Self-pay | Admitting: Internal Medicine

## 2013-04-26 ENCOUNTER — Other Ambulatory Visit: Payer: Self-pay | Admitting: Internal Medicine

## 2013-04-26 ENCOUNTER — Telehealth: Payer: Self-pay | Admitting: Medical Oncology

## 2013-04-26 DIAGNOSIS — C184 Malignant neoplasm of transverse colon: Secondary | ICD-10-CM

## 2013-04-26 NOTE — Telephone Encounter (Signed)
Pt called stating that she went to the ER over the week-end. She started having trouble breathing and went to the ER. They did a scan and a chest x-ray and she was told the lung nodules are bigger. She has not had her biopsy yet. She wants to know if Dr. Juliann Mule is ok with her getting the biopsy. I spoke with Dr. Juliann Mule and he is going to put a POF in hopes to get the biopsy this week. Pt notified.

## 2013-04-27 ENCOUNTER — Telehealth: Payer: Self-pay

## 2013-04-27 ENCOUNTER — Encounter: Payer: Self-pay | Admitting: Family Medicine

## 2013-04-27 ENCOUNTER — Ambulatory Visit (INDEPENDENT_AMBULATORY_CARE_PROVIDER_SITE_OTHER): Payer: Medicare Other | Admitting: Family Medicine

## 2013-04-27 VITALS — BP 110/64 | HR 86 | Temp 98.2°F | Ht 66.0 in | Wt 225.5 lb

## 2013-04-27 DIAGNOSIS — B001 Herpesviral vesicular dermatitis: Secondary | ICD-10-CM | POA: Insufficient documentation

## 2013-04-27 DIAGNOSIS — B009 Herpesviral infection, unspecified: Secondary | ICD-10-CM | POA: Diagnosis not present

## 2013-04-27 DIAGNOSIS — H1030 Unspecified acute conjunctivitis, unspecified eye: Secondary | ICD-10-CM | POA: Diagnosis not present

## 2013-04-27 MED ORDER — NEOMYCIN-POLYMYXIN-HC 3.5-10000-1 OP SUSP: 3.0000 [drp] | Freq: Four times a day (QID) | OPHTHALMIC | Status: DC

## 2013-04-27 NOTE — Telephone Encounter (Signed)
Ask [place] what their closest sub is - she is allergic/ intol to many antibiotics-thanks   (or she may have to go to a different pharmacy)

## 2013-04-27 NOTE — Patient Instructions (Signed)
Put the cortisporin drops in eye four times daily while awake until better  If the other eye becomes involved -treat that eye to  Over the counter abbreva is useful for cold sores  Wash linens Use warm or cold wet cloth when needed Try not to touch eyes  Update if not starting to improve in a week or if worsening

## 2013-04-27 NOTE — Progress Notes (Signed)
Pre visit review using our clinic review tool, if applicable. No additional management support is needed unless otherwise documented below in the visit note. 

## 2013-04-27 NOTE — Telephone Encounter (Signed)
Spoke with pharmacy and Dr. Glori Bickers and substitute given verbally

## 2013-04-27 NOTE — Assessment & Plan Note (Signed)
R eye only  With cloudy drainage No doubt related to recent uri Also a cancer pt  tx with cortisoporin opthy susp qid Update if no imp/worse or vision change

## 2013-04-27 NOTE — Telephone Encounter (Signed)
[facility] left v/m that they are presently out of Cortisporin opthalmic eye drops and request substitute med called to [facility].Please advise.

## 2013-04-27 NOTE — Progress Notes (Signed)
Subjective:    Patient ID: [name], female    DOB: [birth year], 73 y.o.   MRN: [id number]  HPI Here with eye redness and pain  Several days  Tearing a lot - draining  Drainage has color to it- yellow    On biaxin for sinus infection  Also has a cold sore in L corner of mouth   Patient Active Problem List   Diagnosis Date Noted  . Left temporal headache 03/11/2013  . Hyperglycemia 03/11/2013  . Acute sinusitis 02/09/2013  . Other malaise and fatigue 01/04/2013  . Herpes zoster 11/22/2012  . Vaginal pain 11/22/2012  . Cystocele 09/22/2012  . Encounter for Medicare annual wellness exam 09/17/2012  . Left knee pain 09/10/2012  . Thoracic back pain 06/01/2012  . Skin lesion of face 05/17/2012  . Other screening mammogram 07/22/2011  . Routine gynecological examination 07/22/2011  . Colon cancer screening 07/22/2011  . Screening for lipoid disorders 07/17/2011  . History of anemia 05/06/2011  . 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 Hypothyroid 02/24/2011  . Varicose veins 02/24/2011  . Elevated blood pressure 11/20/2010  . Obesity 11/20/2010  . 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 Other chronic sinusitis 07/03/2009  . THROAT PAIN, CHRONIC 07/03/2009  . GERD 12/20/2008  . Anxiety state, unspecified 08/22/2008  . MENOPAUSAL SYNDROME 07/13/2007  . HYPERCHOLESTEROLEMIA, PURE 07/08/2006  . ALLERGIC  RHINITIS 07/08/2006  . OVERACTIVE BLADDER 07/08/2006  . 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 DEPRESSION 06/25/2006  . PERIPHERAL VASCULAR DISEASE 06/25/2006  . OSTEOARTHRITIS 06/25/2006  . LEG EDEMA 06/25/2006  . SKIN CANCER, HX OF 06/25/2006  . COLONIC POLYPS, HX OF 06/25/2006   Past Medical History  Diagnosis Date  . Colon polyps   . Depression   . Hypothyroidism   . Osteoarthritis     hands  . Peripheral vascular disease   . Degenerative disk disease     spine in center in past   . Other  asplenic status 04/01/2011  . Cancer     skin cancer- basal cell on arm / colon 2011  . Lymphoma   . Colon cancer    Past Surgical History  Procedure Laterality Date  . Cholecystectomy    . Splenectomy      lymphoma  . Breast biopsy  1996  . Plantar fascia release    . Ventral hernia repair  1998  . Tendon release      Right thumb   . Colectomy  8/11  . Vaginal hysterectomy     History  Substance Use Topics  . Smoking status: Never Smoker   . Smokeless tobacco: Not on file  . Alcohol Use: No   Family History  Problem Relation Age of Onset  . Coronary artery disease Mother   . Alcohol abuse Mother   . Cancer Father     jaw  . Diabetes Brother   . Fibromyalgia Daughter     chronic pain   . COPD Daughter   . Colon cancer Neg Hx   . Colon polyps Neg Hx   . Stomach cancer Neg Hx    Allergies  Allergen Reactions  . Achromycin [Tetracycline Hcl]     Pt does not remember reaction  . Allopurinol     REACTION: Unsure of reaction happene years ago  . Astelin [Azelastine Hcl]     Unknown  . Cephalexin     REACTION: unsure of reaction happened yrs ago.  Marland Kitchen  Codeine Other (See Comments)    REACTION: abd. pain  . Meloxicam Other (See Comments)    REACTION: GI symptoms  . Minocycline Other (See Comments)    Abdominal pain  . Nabumetone     REACTION: reaction not known  . Nyquil [Pseudoeph-Doxylamine-Dm-Apap] Hives  . Penicillins     REACTION: reaction not known  . Sulfa Antibiotics     Gi side eff   . Zolpidem Tartrate     REACTION: feels too drugged  . Buspar [Buspirone Hcl]     Dizziness, and not as effective for anxiety  . Ciprofloxacin Rash   Current Outpatient Prescriptions on File Prior to Visit  Medication Sig Dispense Refill  . acetaminophen (TYLENOL) 500 MG tablet Take 1,000 mg by mouth every 6 (six) hours as needed.      . benzonatate (TESSALON) 200 MG capsule Take 200 mg by mouth 4 (four) times daily as needed for cough.      . Calcium Carbonate-Vitamin  D (CALCIUM 600+D) 600-400 MG-UNIT per tablet Take 1 tablet by mouth daily.        . celecoxib (CELEBREX) 200 MG capsule Take 1 capsule (200 mg total) by mouth daily as needed.  90 capsule  3  . chlorpheniramine-HYDROcodone (TUSSIONEX) 10-8 MG/5ML LQCR Take 5 mLs by mouth 2 (two) times daily. As needed for cough  120 mL  0  . cholecalciferol (VITAMIN D) 1000 UNITS tablet Take 1,000 Units by mouth daily.        . clarithromycin (BIAXIN) 500 MG tablet Take 1 tablet (500 mg total) by mouth 2 (two) times daily.  28 tablet  0  . estradiol (ESTRACE) 2 MG tablet Take 1 mg by mouth daily.       . fexofenadine (ALLEGRA) 180 MG tablet Take 1 tablet (180 mg total) by mouth daily.  90 tablet  3  . FLUoxetine (PROZAC) 20 MG capsule Take 20 mg by mouth daily.       . fluticasone (FLONASE) 50 MCG/ACT nasal spray Place 1 spray into the nose 2 (two) times daily.       Marland Kitchen levothyroxine (SYNTHROID, LEVOTHROID) 112 MCG tablet Take 1 tablet (112 mcg total) by mouth daily.  90 tablet  3  . NON FORMULARY once a week. ALLERGY SHOTS      . Omega-3 350 MG CAPS Take 1 capsule by mouth daily.       Marland Kitchen omeprazole (PRILOSEC) 40 MG capsule Take 40 mg by mouth 2 (two) times daily.       . psyllium (METAMUCIL) 58.6 % packet Take 1-3 packets by mouth daily.       . simvastatin (ZOCOR) 40 MG tablet Take 1 tablet (40 mg total) by mouth daily.  90 tablet  3  . vitamin E (VITAMIN E) 400 UNIT capsule Take 400 Units by mouth daily.         Current Facility-Administered Medications on File Prior to Visit  Medication Dose Route Frequency Provider Last Rate Last Dose  . 0.9 %  sodium chloride infusion  500 mL Intravenous Continuous Inda Castle, MD          Review of Systems Review of Systems  Constitutional: Negative for fever, appetite change, and unexpected weight change.  ENT pos for congestion that is improved  Eyes: Negative for pain and visual disturbance. pos for itching/burning and redness of R eye  Respiratory: Negative  for cough and shortness of breath.   Cardiovascular: Negative for cp or palpitations  Gastrointestinal: Negative for nausea, diarrhea and constipation.  Genitourinary: Negative for urgency and frequency.  Skin: Negative for pallor or rash  pos for cold sore Neurological: Negative for weakness, light-headedness, numbness and headaches.  Hematological: Negative for adenopathy. Does not bruise/bleed easily.  Psychiatric/Behavioral: Negative for dysphoric mood. The patient is not nervous/anxious.         Objective:   Physical Exam  Constitutional: She appears well-developed and well-nourished. No distress.  obese and well appearing   HENT:  Head: Normocephalic and atraumatic.  Mouth/Throat: Oropharynx is clear and moist. No oropharyngeal exudate.  Nares are boggy-clear rhinorrhea   Throat is clear   Eyes: EOM are normal. Pupils are equal, round, and reactive to light. Right eye exhibits discharge. Left eye exhibits no discharge. No scleral icterus.  Conj injection worse medially in R eye with cloudy drainage and nl vision  No swelling    Neck: Normal range of motion. Neck supple.  Cardiovascular: Normal rate and regular rhythm.   Pulmonary/Chest: Effort normal and breath sounds normal.  Lymphadenopathy:    She has no cervical adenopathy.  Skin: Skin is warm and dry. There is erythema.  Vesicle on L corner of mouth/upper lip consistent with a cold sore   Psychiatric: She has a normal mood and affect.          Assessment & Plan:

## 2013-04-27 NOTE — Assessment & Plan Note (Signed)
Already resolving with recent uri Also a cancer pt (not immunocomp at this time) Too late for valtrex Will use abbreva otc and update

## 2013-04-29 ENCOUNTER — Other Ambulatory Visit: Payer: Self-pay | Admitting: Radiology

## 2013-05-02 ENCOUNTER — Other Ambulatory Visit: Payer: Self-pay | Admitting: Medical Oncology

## 2013-05-02 ENCOUNTER — Ambulatory Visit: Payer: Medicare Other

## 2013-05-02 ENCOUNTER — Encounter (HOSPITAL_COMMUNITY): Payer: Self-pay | Admitting: Pharmacy Technician

## 2013-05-02 ENCOUNTER — Other Ambulatory Visit: Payer: Self-pay | Admitting: Family Medicine

## 2013-05-02 NOTE — Progress Notes (Signed)
Pt called and states she will be having her biopsy tomorrow. She called scheduling to get an appt with Dr. Juliann Mule and she was told it will be 3 weeks. Per Dr. Juliann Mule he would like to see her Friday since he will be on PAL next week. POF made and pt notified.

## 2013-05-03 ENCOUNTER — Encounter (HOSPITAL_COMMUNITY)
Admission: RE | Admit: 2013-05-03 | Discharge: 2013-05-03 | Disposition: A | Discharge status: Home / Self Care | Payer: Medicare Other | Source: Ambulatory Visit | Attending: Interventional Radiology | Admitting: Interventional Radiology

## 2013-05-03 ENCOUNTER — Encounter (HOSPITAL_COMMUNITY): Payer: Self-pay

## 2013-05-03 ENCOUNTER — Ambulatory Visit (HOSPITAL_COMMUNITY)
Admission: RE | Admit: 2013-05-03 | Discharge: 2013-05-03 | Disposition: A | Discharge status: Home / Self Care | Payer: Medicare Other | Source: Ambulatory Visit | Attending: Internal Medicine | Admitting: Internal Medicine

## 2013-05-03 DIAGNOSIS — R911 Solitary pulmonary nodule: Secondary | ICD-10-CM | POA: Diagnosis not present

## 2013-05-03 DIAGNOSIS — Z85038 Personal history of other malignant neoplasm of large intestine: Secondary | ICD-10-CM | POA: Diagnosis not present

## 2013-05-03 DIAGNOSIS — R918 Other nonspecific abnormal finding of lung field: Secondary | ICD-10-CM | POA: Diagnosis not present

## 2013-05-03 DIAGNOSIS — C8190 Hodgkin lymphoma, unspecified, unspecified site: Secondary | ICD-10-CM | POA: Diagnosis not present

## 2013-05-03 DIAGNOSIS — R222 Localized swelling, mass and lump, trunk: Secondary | ICD-10-CM | POA: Diagnosis not present

## 2013-05-03 DIAGNOSIS — C184 Malignant neoplasm of transverse colon: Secondary | ICD-10-CM

## 2013-05-03 LAB — CBC
HEMATOCRIT: 39 % (ref 36.0–46.0)
HEMOGLOBIN: 13.5 g/dL (ref 12.0–15.0)
MCH: 31.2 pg (ref 26.0–34.0)
MCHC: 34.6 g/dL (ref 30.0–36.0)
MCV: 90.1 fL (ref 78.0–100.0)
Platelets: 334 10*3/uL (ref 150–400)
RBC: 4.33 MIL/uL (ref 3.87–5.11)
RDW: 17.8 % — ABNORMAL HIGH (ref 11.5–15.5)
WBC: 7.3 10*3/uL (ref 4.0–10.5)

## 2013-05-03 LAB — PROTIME-INR
INR: 0.91 (ref 0.00–1.49)
Prothrombin Time: 12.1 seconds (ref 11.6–15.2)

## 2013-05-03 LAB — APTT: APTT: 26 s (ref 24–37)

## 2013-05-03 MED ORDER — LIDOCAINE HCL 1 % IJ SOLN
INTRAMUSCULAR | Status: AC
  Filled 2013-05-03: qty 10

## 2013-05-03 MED ORDER — SODIUM CHLORIDE 0.9 % IV SOLN
Freq: Once | INTRAVENOUS | Status: AC
  Administered 2013-05-03: 10:00:00 via INTRAVENOUS

## 2013-05-03 MED ORDER — FENTANYL CITRATE 0.05 MG/ML IJ SOLN
INTRAMUSCULAR | Status: AC
  Filled 2013-05-03: qty 2

## 2013-05-03 MED ORDER — MIDAZOLAM HCL 2 MG/2ML IJ SOLN
INTRAMUSCULAR | Status: AC
  Filled 2013-05-03: qty 4

## 2013-05-03 MED ORDER — MIDAZOLAM HCL 2 MG/2ML IJ SOLN
INTRAMUSCULAR | Status: AC | PRN
  Administered 2013-05-03 (×2): 1 mg via INTRAVENOUS

## 2013-05-03 MED ORDER — FENTANYL CITRATE 0.05 MG/ML IJ SOLN
INTRAMUSCULAR | Status: AC | PRN
  Administered 2013-05-03 (×2): 25 ug via INTRAVENOUS

## 2013-05-03 NOTE — Discharge Instructions (Signed)
Needle Biopsy of Lung, Care After Refer to this sheet in the next few weeks. These instructions provide you with information on caring for yourself after your procedure. Your health care provider may also give you more specific instructions. Your treatment has been planned according to current medical practices, but problems sometimes occur. Call your health care provider if you have any problems or questions after your procedure. WHAT TO EXPECT AFTER THE PROCEDURE A bandage will be applied over the areas where the needle was inserted. You may be asked to apply pressure to the bandage for several minutes to ensure there is minimal bleeding. In most cases, you can leave when your needle biopsy procedure is completed. Do not drive yourself home. Someone else should take you home. If you received an IV sedative or general anesthetic, you will be taken to a comfortable place to relax while the medication wears off. If you have upcoming travel scheduled, talk to your doctor about when it is safe to travel by air after the procedure. HOME CARE INSTRUCTIONS Expect to take it easy for the rest of the day. Protect the area where you received the needle biopsy by keeping the bandage in place for as long as instructed. You may feel some mild pain or discomfort in the area, but this should stop in a day or two. Only take over-the-counter or prescription medicines for pain, discomfort, or fever as directed by your caregiver. SEEK MEDICAL CARE IF:   You have pain at the biopsy site that worsens or is not helped by medication.  You have swelling or drainage at the needle biopsy site.  You have a fever. SEEK IMMEDIATE MEDICAL CARE IF:   You have new or worsening shortness of breath.  You have chest pain.  You are coughing up blood.  You have bleeding that does not stop with pressure or a bandage.  You develop light-headedness or fainting. Document Released: 11/24/2006 Document Revised: 09/29/2012 Document  Reviewed: 06/21/2012 [facility] Patient Information 2014 [place].

## 2013-05-03 NOTE — Procedures (Signed)
Successful RLL SUP SEG NODULE 18G CORE BX NO COMP STABLE FULL REPORT IN PACS

## 2013-05-03 NOTE — H&P (Signed)
Diamond Boyd is an 73 y.o. female.   Chief Complaint: Remote hx NHL 1994; splenectomy 1995 Dx Colon Ca 2011 Follows with Dr Juliann Mule Follow up CT 01/2013 revealed B pulmonary nodules and Lymphadenopathy 03/2013 PET was + RLL pulm nodule; L thyroid nodule; R sacral mass and T9 lesion 03/22/13 Thyroid biopsy was inconclusive Now scheduled for Right lung mass biopsy  HPI: Hypothyroid; depression; PVD; DDD; Lymphoma; Colon Ca; Skin Ca  Past Medical History  Diagnosis Date  . Colon polyps   . Depression   . Hypothyroidism   . Osteoarthritis     hands  . Peripheral vascular disease   . Degenerative disk disease     spine in center in past   . 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 Cancer     skin cancer- basal cell on arm / colon 2011  . Lymphoma   . Colon cancer     Past Surgical History  Procedure Laterality Date  . Cholecystectomy    . Splenectomy      lymphoma  . Breast biopsy  1996  . Plantar fascia release    . Ventral hernia repair  1998  . Tendon release      Right thumb   . Colectomy  8/11  . Vaginal hysterectomy      Family History  Problem Relation Age of Onset  . Coronary artery disease Mother   . Alcohol abuse Mother   . Cancer Father     jaw  . Diabetes Brother   . Fibromyalgia Daughter     chronic pain   . COPD Daughter   . Colon cancer Neg Hx   . Colon polyps Neg Hx   . Stomach cancer Neg Hx    Social History:  reports that she has never smoked. She does not have any smokeless tobacco history on file. She reports that she does not drink alcohol or use illicit drugs.  Allergies:  Allergies  Allergen Reactions  . Achromycin [Tetracycline Hcl]     Pt does not remember reaction  . Allopurinol     REACTION: Unsure of reaction happene years ago  . Astelin [Azelastine Hcl]     Unknown  . Cephalexin     REACTION: unsure of reaction happened yrs ago.  . Codeine Other (See Comments)    REACTION: abd. pain  . Meloxicam Other (See Comments)   REACTION: GI symptoms  . Minocycline Other (See Comments)    Abdominal pain  . Nabumetone     REACTION: reaction not known  . Nyquil [Pseudoeph-Doxylamine-Dm-Apap] Hives  . Penicillins     REACTION: reaction not known  . Sulfa Antibiotics     Gi side eff   . Zolpidem Tartrate     REACTION: feels too drugged  . Buspar [Buspirone Hcl]     Dizziness, and not as effective for anxiety  . Ciprofloxacin Rash     (Not in a hospital admission)  No results found for this or any previous visit (from the past 48 hour(s)). No results found.  Review of Systems  Constitutional: Negative for fever.  Respiratory: Negative for shortness of breath.   Cardiovascular: Negative for chest pain.  Gastrointestinal: Negative for nausea and vomiting.  Neurological: Negative for dizziness, weakness and headaches.  Psychiatric/Behavioral: Negative for substance abuse.    Blood pressure 153/69, pulse 80, temperature 98.3 F (36.8 C), temperature source Oral, resp. rate 18, height 5\' 6"  (1.676 m), weight 99.791 kg (220 lb), last menstrual period 02/10/1970, SpO2 98.00%.  Physical Exam  Constitutional: She is oriented to person, place, and time. She appears well-developed.  HENT:  Left canthus of lips has obvious HSV sore- "several days old"  Eyes:  Rt eye redness: being treated for conjunctivitis (antibx x 6 days so far)  Cardiovascular: Normal rate, regular rhythm and normal heart sounds.   No murmur heard. Respiratory: Effort normal and breath sounds normal. She has no wheezes.  GI: Soft. Bowel sounds are normal. There is no tenderness.  Musculoskeletal: Normal range of motion.  Neurological: She is alert and oriented to person, place, and time.  Skin: Skin is warm.     Assessment/Plan Hx Colon Ca 2011 Lymphoma 1994 New B pulmonary nodules on follow up CT 01/2013 03/2013 +PET RLL; L thyroid; sacrum and T9 L thyroid Bx inconclusive Now scheduled for RLL mass bx in IR Pt aware of procedure  benefits and risks and agreeable to proceed Consent signed andin chart  Slayton Lubitz A 05/03/2013, 10:28 AM

## 2013-05-03 NOTE — Sedation Documentation (Signed)
Resting pt on R side

## 2013-05-03 NOTE — Sedation Documentation (Signed)
Pt supressing cough

## 2013-05-03 NOTE — Progress Notes (Signed)
Pt states she has pink eye in her (R) eye.  States she has been on antibiotic eye drops since wed 04/27/2013. Her (R) eye is still very red.  Pam Turpin,P.A. Notified.

## 2013-05-04 ENCOUNTER — Telehealth: Payer: Self-pay | Admitting: Internal Medicine

## 2013-05-04 NOTE — Telephone Encounter (Signed)
, °

## 2013-05-06 ENCOUNTER — Telehealth: Payer: Self-pay | Admitting: Internal Medicine

## 2013-05-06 ENCOUNTER — Ambulatory Visit (HOSPITAL_BASED_OUTPATIENT_CLINIC_OR_DEPARTMENT_OTHER): Payer: Medicare Other | Admitting: Internal Medicine

## 2013-05-06 VITALS — BP 155/77 | HR 87 | Temp 98.2°F | Resp 19 | Ht 66.0 in | Wt 224.1 lb

## 2013-05-06 DIAGNOSIS — R634 Abnormal weight loss: Secondary | ICD-10-CM | POA: Diagnosis not present

## 2013-05-06 DIAGNOSIS — C8587 Other specified types of non-Hodgkin lymphoma, spleen: Secondary | ICD-10-CM

## 2013-05-06 DIAGNOSIS — R911 Solitary pulmonary nodule: Secondary | ICD-10-CM | POA: Diagnosis not present

## 2013-05-06 DIAGNOSIS — B029 Zoster without complications: Secondary | ICD-10-CM

## 2013-05-06 DIAGNOSIS — E041 Nontoxic single thyroid nodule: Secondary | ICD-10-CM | POA: Diagnosis not present

## 2013-05-06 DIAGNOSIS — C184 Malignant neoplasm of transverse colon: Secondary | ICD-10-CM

## 2013-05-06 NOTE — Telephone Encounter (Signed)
gv adn printed appt sched adn avs for pt for April

## 2013-05-09 NOTE — Progress Notes (Signed)
[place], MD [address] [place] [address]  DIAGNOSIS: Malignant neoplasm of transverse colon - Plan: CBC with Differential, Comprehensive metabolic panel (Cmet) - [facility], Lactate dehydrogenase (LDH) - [facility]  LYMPHOMA NEC, MLIG, SPLEEN  Chief Complaint  Patient presents with  . Malignant neoplasm of transverse colon   CURRENT THERAPY: Surveillance.   INTERVAL HISTORY: Diamond Boyd 73 y.o. 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  She was last seen me on 03/21/2012.  She is accompanied by her husband Diamond Boyd.  Doing her last follow-up she had a follow a CT of abdomen and pelvis which demonstrated 3 small pulmonary nodules in the RLL concerning for metastases with recommendations for a PET/CT.  PET/CT was obtained today is as outlined below.  She had ultrasound of thyroid and biopsy which was negative.  She then underwent right lower lobe lung biopsy (Dr. Greggory Keen) on 05/03/2013 with pathology pending.   Today, she reports some weight lost about 10 lbs since her last visit.  Otherwsise, her energy and appetite is stable.  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  She denies specifically any pain, difficulty eating, trouble with her bowels, blood in the stool, fever, chills, night sweats. She is without complaints today. Patient presented to the emergency room on 04/23/13 with complaints of shortness of breath and fever and CTA of chest was as noted below. Pt states she has pink eye in her (R) eye. States she has been on antibiotic eye drops since wed 04/27/2013.   MEDICAL HISTORY: Past Medical History  Diagnosis  Date  . Colon polyps   . Depression   . Hypothyroidism   . Osteoarthritis     hands  . Peripheral vascular disease   . Degenerative disk disease     spine in center in past   . 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 Cancer     skin cancer- basal cell on arm / colon 2011  . Lymphoma   . Colon cancer     INTERIM HISTORY: has MALIGNANT NEOPLASM OF TRANSVERSE COLON; LYMPHOMA NEC, MLIG, SPLEEN; HYPERCHOLESTEROLEMIA, PURE; DISORDERS OF PHOSPHORUS METABOLISM; Anxiety state, unspecified; DEPRESSION; PERIPHERAL VASCULAR DISEASE; Other chronic sinusitis; ALLERGIC  RHINITIS; GERD; OVERACTIVE BLADDER; MENOPAUSAL SYNDROME; OSTEOARTHRITIS; LEG EDEMA; THROAT PAIN, CHRONIC; SKIN CANCER, HX OF; COLONIC POLYPS, HX OF; Elevated blood pressure; Obesity; Hypothyroid; Varicose veins; Other asplenic status; History of anemia; Screening for lipoid disorders; Other screening mammogram; Routine gynecological examination; Colon cancer screening; Skin lesion of face; Thoracic back pain; Left knee pain; Encounter for Medicare annual wellness exam; Cystocele; Herpes zoster; Vaginal pain; Other malaise and fatigue; Acute sinusitis; Left temporal headache; Hyperglycemia; Conjunctivitis, acute; and Cold sore on her problem list.    ALLERGIES:  is allergic to achromycin; allopurinol; astelin; cephalexin; codeine; meloxicam; minocycline; nabumetone; nyquil; penicillins; sulfa antibiotics; zolpidem tartrate; buspar; and ciprofloxacin.  MEDICATIONS: has a current medication list which includes the following prescription(s): acetaminophen, calcium carbonate-vitamin d, celecoxib, cholecalciferol, estradiol, fexofenadine, fluoxetine, fluticasone, levothyroxine, neomycin-polymyxin b-dexamethasone, omega-3, omeprazole, psyllium, simvastatin, and vitamin e, and the following Facility-Administered Medications: sodium chloride.  SURGICAL HISTORY:  Past Surgical History  Procedure Laterality  Date  . Cholecystectomy    . Splenectomy       lymphoma  . Breast biopsy  1996  . Plantar fascia release    . Ventral hernia repair  1998  . Tendon release      Right thumb   . Colectomy  8/11  . Vaginal hysterectomy     PROBLEM LIST:  1. Adenocarcinoma of the colon with 2 synchronous primaries dating back to August 2011 when the patient was found to have anemia. Both tumors were associated with negative lymph nodes. One tumor was T2 N0. The other tumor was T3 N0. The T2 N0 stage I tumor was in the sigmoid colon. The T3 N0 stage II tumor was in the transverse colon. The smaller tumor measured 1.5 cm. The larger tumor measured 6.5 cm. Surgery was carried out on 09/28/2009. 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  2. History of marginal zone lymphoma probably dating back to 28. The patient had bone marrow involvement and splenomegaly at that time, also a hemolytic anemia. She underwent a splenectomy on  November 12, 1994. Through the years, the patient has received Rituxan most recently, dating back to May 2006. The patient received fludarabine in late 2002 and early 2003. She may have developed a pneumonitis from the fludarabine in December 2002. The patient has had involvement of the peripheral blood with villous lymphocytes. She appears to be in a state of complete clinical remission at this time. Tumor is CD5 and CD20 positive, also CD11c and FMC7 positive.  3. 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  4. History of lupus anticoagulant dating back to the 1990s.  5. History of iron deficiency anemia for which the patient received IV Feraheme in January 2012. Presumably, this occurred as a result of her colon cancer.  6. History of GERD resulting in cough diagnosed around 2010.  7. Dyslipidemia.  8. Hypothyroidism.  9. Irritable bowel syndrome.  10. Goiter seen on PET scan from 09/04/2009.  11. Enlarged left tonsil diagnosed April 2011, currently resolved.  12. History of  depression.   REVIEW OF SYSTEMS:   Constitutional: Denies fevers, chills or abnormal weight loss Eyes: Denies blurriness of vision Ears, nose, mouth, throat, and face: Denies mucositis or sore throat Respiratory: Denies cough, dyspnea or wheezes Cardiovascular: Denies palpitation, chest discomfort or lower extremity swelling Gastrointestinal:  Denies nausea, heartburn or change in bowel habits Skin: Denies abnormal skin rashes Lymphatics: Denies new lymphadenopathy or easy bruising Neurological:Denies numbness, tingling or new weaknesses Behavioral/Psych: Mood is stable, no new changes  All other systems were reviewed with the patient and are negative.  PHYSICAL EXAMINATION: ECOG PERFORMANCE STATUS: 0 - Asymptomatic  Blood pressure 155/77, pulse 87, temperature 98.2 F (36.8 C), resp. rate 19, height 5\' 6"  (1.676 m), weight 224 lb 1.6 oz (101.651 kg), last menstrual period 02/10/1970.  GENERAL:alert, no distress and comfortable; moderately obese.  SKIN: skin color, texture, turgor are normal, no rashes or significant lesions EYES: normal, Conjunctiva are pink and non-injected, sclera clear OROPHARYNX:no exudate, no erythema and lips, buccal mucosa, and tongue normal  NECK: supple, thyroid normal size, non-tender, without nodularity.  Left neck adenopathy.   LYMPH:  no palpable lymphadenopathy in the cervical, axillary or supraclavicular LUNGS: clear to auscultation and percussion with normal breathing effort HEART: regular rate & rhythm and no murmurs and no lower extremity edema ABDOMEN:abdomen soft, non-tender and normal bowel sounds; multiple surgical scars well  healed.   Musculoskeletal:no cyanosis of digits and no clubbing  NEURO: alert & oriented x 3 with fluent speech, no focal motor/sensory deficits  Labs:  Lab Results  Component Value Date   WBC 7.3 05/03/2013   HGB 13.5 05/03/2013   HCT 39.0 05/03/2013   MCV 90.1 05/03/2013   PLT 334 05/03/2013   NEUTROABS 2.7 01/13/2013       Chemistry      Component Value Date/Time   NA 137 04/23/2013 1157   NA 142 03/09/2013 1029   K 4.6 04/23/2013 1157   K 4.2 03/09/2013 1029   CL 98 04/23/2013 1157   CL 105 07/14/2012 0939   CO2 22 04/23/2013 1157   CO2 28 03/09/2013 1029   BUN 11 04/23/2013 1157   BUN 8.5 03/09/2013 1029   CREATININE 0.60 04/23/2013 1157   CREATININE 0.7 03/09/2013 1029      Component Value Date/Time   CALCIUM 9.4 04/23/2013 1157   CALCIUM 9.9 03/09/2013 1029   CALCIUM 9.3 08/09/2009 0000   ALKPHOS 183* 03/09/2013 1029   ALKPHOS 82 09/17/2012 1323   AST 52* 03/09/2013 1029   AST 23 09/17/2012 1323   ALT 31 03/09/2013 1029   ALT 21 09/17/2012 1323   BILITOT 0.48 03/09/2013 1029   BILITOT 0.8 09/17/2012 1323     Results for SMITH, MCNICHOLAS (MRN [id number]) as of 03/21/2013 13:37  Ref. Range 01/13/2013 09:40  CEA Latest Range: 0.0-5.0 ng/mL 1.9   IMAGING STUDIES:  1. Digital screening mammogram from 08/05/2010 was negative.  2. Chest x-Diamond Boyd, 2 view, from 12/09/2010 was negative.  3. Digital screening mammogram on 08/20/2011 showed no specific evidence of malignancy  4. Chest x-Diamond Boyd, 2 view, from 11/19/2011 showed atherosclerotic calcifications within the arch of the aorta. There was evidence of a prior cholecystectomy and splenectomy, otherwise chest x-Diamond Boyd was negative.  5. CT scan of abdomen and pelvis with IV contrast obtained on 01/21/2012 showed no clear evidence of colon cancer metastasis. A retrocrural lymph node had increased in size but periportal adenopathy and periaortic adenopathy was stable to decreased. The retrocrural lymph node refer to measures 8 mm on image 12, increased from 5 mm on a prior study.  6. Chest x-Diamond Boyd, 2 view, from 06/01/2012 showed no acute abnormalities. There was borderline enlargement of the cardiac silhouette.  7. Chest x-Diamond Boyd, 2 view, from 03/09/2013, showed new pulmonary nodules in the right lung. Cannot rule out  metastatic disease. Correlation with CT recommended. (reviewed by  me) 8. CT scan of abdomen and pelvis with IV contrast on 03/09/2013 showed small right lower lobe pulmonary nodules measuring up to 8 mm, new, suspicious for metastases. Small right juxtadiaphragmatic, upper abdominal, retroperitoneal, and left pelvic lymph nodes, stable versus mildly increased. Prior splenectomy, cholecystectomy, and right hemicolectomy. PET-CT is suggested for further evaluation.  (reviewed by me) 9. PET/CT on 03/21/2013 revealed intensely hypermetabolic nodule within the left lobe of thyroid gland. Concern for thyroid carcinoma. 2. Two intensely hypermetabolic right lower lobe pulmonary nodules. Differential includes thyroid cancer metastasis or colon cancer metastasis. 3. Intense uptake within the right sacrum is concerning metastasis. Milder abnormal uptake within the T9 vertebral body is indeterminate but concerning for metastasis.  PROCEDURES:  Colonoscopy was carried out by Dr. Erskine Emery on 10/10/2010.  Additional imaging.  CT ANGIOGRAPHY CHEST WITH CONTRAST  TECHNIQUE:  Multidetector CT imaging of the chest was performed using the  standard protocol during bolus administration of intravenous  contrast. Multiplanar CT image reconstructions and MIPs were  obtained to evaluate the vascular anatomy.  CONTRAST: 176mL OMNIPAQUE IOHEXOL 350 MG/ML SOLN  COMPARISON: Chest CT September 29, 2009 and chest radiograph April 23, 2013 ; PET-CT March 21, 2013  FINDINGS:  There is no demonstrable pulmonary embolus. There is no thoracic  aortic aneurysm or dissection.  There are multiple nodular lesions throughout the lungs. The largest  nodular lesion is in the posterior segment of the right upper lobe  near the major fissure measuring 2.5 x 2.4 cm. Nodular opacity  scattered elsewhere throughout the lungs range in size from as small  as 3 mm to as large as 14. x 1.0 cm. Nodular lesions are noted to  varying degrees and most lobes in segments of the lungs bilaterally  with a  slightly greater preponderance of nodular opacities in the  lower lobes compared elsewhere.  There is no lung edema or consolidation.  There is a mildly prominent lymph node in the right hilum measuring  2.0 by 1.9 cm. There is a sub- carinal lymph node measuring 2.2 x  1.6 cm. There are scattered subcentimeter mediastinal lymph nodes.  There are scattered foci of coronary artery calcification.  Pericardium is not thickened.  Visualized upper abdominal structures appear normal. There are no  blastic or lytic bone lesions. There is degenerative change in the  thoracic spine. There is enlargement of the left lobe of the thyroid  compared to the right, as well as a focal mass in the left lobe of  the thyroid, noted on recent PET examination.  Review of the MIP images confirms the above findings.  IMPRESSION:  No demonstrable pulmonary embolus. Multiple nodular opacities in the  lungs, marginally increased from recent prior PET examination. Mild  adenopathy. Mass in left lobe of thyroid. Underlying neoplasm is  certainly felt to be present.  PATHOLOGY: 03/31/2013    Diagnosis THYROID, FINE NEEDLE ASPIRATION, LEFT INFERIOR, (SPECIMEN 2 OF 2, COLLECTED ON 03/31/2013): BENIGN. FINDINGS CONSISTENT WITH A BENIGN FOLLICULAR NODULE. Enid Cutter MD Pathologist, Electronic Signature (Case signed 04/01/2013)  Diagnosis THYROID, FINE NEEDLE ASPIRATION LEFT MID, (SPECIMEN 1 OF 2, COLLECTED ON 7/82/9562): FOLLICULAR LESION OF UNDETERMINED SIGNIFICANCE. SEE COMMENT. COMMENT: THE SPECIMEN IS HYPERCELLULAR AND CONSISTS OF SMALL AND MEDIUM SIZED GROUPS OF FOLLICULAR EPITHELIAL CELLS. SOME CELLS SHOW HURTHLE LIKE CHANGES. THERE IS CYTOLOGIC ATYPIA IN THE FORM OF NUCLEOMEGALY AND INTRANUCLEAR GROOVES. THERE IS MINIMAL COLLOID. BASED ON THESE FINDINGS, A FOLLICULAR NEOPLASM CAN NOT BE ENTIRELY RULED OUT. Enid Cutter MD Pathologist, Electronic Signature (Case signed 04/01/2013) ASSESSMENT: Diamond Boyd 73 y.o. female with a history of Malignant neoplasm of transverse colon - Plan: CBC with Differential, Comprehensive metabolic panel (Cmet) - [facility], Lactate dehydrogenase (LDH) - [facility]  LYMPHOMA NEC, MLIG, SPLEEN   PLAN:  1. Marginal zone lymphoma now with multiple lung nodules.  --Her marginal zone lymphoma continues to be quiescent even though one of the lymph nodes removed at the time of colon cancer surgery in August 2011 was found to have B-cell non-Hodgkin lymphoma. Diamond Boyd is on no treatment and in fact, has not required any therapy since May 2006 at which time she received Rituxan.   --Her CT of abdomen is as indicated above.  She has had  weight lost. Otherwise, she denies any additional constitutional symptoms such as fever or chills. Preliminary biopsy from CT guided lung biopsy is negative for colon adenocarcinoma.  It appeared suspicious for lymphoproliferative disorder.   2. Colon cancer now with multiple lung nodules in RLL.   --  Diamond Boyd remains clinically stable.  However she had new pulmonary nodules in the RLL hypermetabolic by CT.  CT guided RLL lung biopsy was obtained on 03/24.  Preliminary negative for adenocarcinoma. We will await official pathology of her core biopsy.  --Her last colonoscopy was on  10/10/2010 by Dr. Deatra Ina.  We will make Dr. Deatra Ina of aware. CEA is stable at 1.9.     3. Thyroid nodule.    --Her PET/CT was also concerning for thyroid carcinoma. TSH on 01/04/2013 was 0.51.  Biopsy as reported above.  We will await final pathology from ct guided biopsy of the lung.   4. 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 Provided a handout about lyrica which might help.  She will discuss with her PCP.  5. Follow-up.  --We will plan to see Diamond Boyd again in 1-2 weeks to discuss the pathology results of her RLL lung biopsy.   All questions were answered. The patient knows to call the clinic with any problems,  questions or concerns. We can certainly see the patient much sooner if necessary.  I spent 15 minutes counseling the patient face to face. The total time spent in the appointment was 25 minutes.    Lukisha Procida, MD 05/09/2013 9:58 AM

## 2013-05-12 DIAGNOSIS — J3089 Other allergic rhinitis: Secondary | ICD-10-CM | POA: Diagnosis not present

## 2013-05-12 DIAGNOSIS — J301 Allergic rhinitis due to pollen: Secondary | ICD-10-CM | POA: Diagnosis not present

## 2013-05-17 ENCOUNTER — Telehealth: Payer: Self-pay

## 2013-05-17 NOTE — Telephone Encounter (Signed)
Returning pt call of [phone].

## 2013-05-18 ENCOUNTER — Ambulatory Visit (INDEPENDENT_AMBULATORY_CARE_PROVIDER_SITE_OTHER): Payer: Medicare Other | Admitting: Family Medicine

## 2013-05-18 ENCOUNTER — Encounter: Payer: Self-pay | Admitting: Family Medicine

## 2013-05-18 VITALS — BP 132/62 | HR 87 | Temp 100.7°F | Ht 66.0 in | Wt 224.0 lb

## 2013-05-18 DIAGNOSIS — C184 Malignant neoplasm of transverse colon: Secondary | ICD-10-CM | POA: Diagnosis not present

## 2013-05-18 DIAGNOSIS — H01019 Ulcerative blepharitis unspecified eye, unspecified eyelid: Secondary | ICD-10-CM

## 2013-05-18 DIAGNOSIS — H5710 Ocular pain, unspecified eye: Secondary | ICD-10-CM

## 2013-05-18 DIAGNOSIS — H01003 Unspecified blepharitis right eye, unspecified eyelid: Secondary | ICD-10-CM

## 2013-05-18 DIAGNOSIS — C8587 Other specified types of non-Hodgkin lymphoma, spleen: Secondary | ICD-10-CM

## 2013-05-18 DIAGNOSIS — H01009 Unspecified blepharitis unspecified eye, unspecified eyelid: Secondary | ICD-10-CM | POA: Diagnosis not present

## 2013-05-18 DIAGNOSIS — H018 Other specified inflammations of eyelid: Secondary | ICD-10-CM

## 2013-05-18 DIAGNOSIS — R948 Abnormal results of function studies of other organs and systems: Secondary | ICD-10-CM

## 2013-05-18 NOTE — Progress Notes (Signed)
Pre visit review using our clinic review tool, if applicable. No additional management support is needed unless otherwise documented below in the visit note. 

## 2013-05-18 NOTE — Patient Instructions (Signed)
REFERRALS TO SPECIALISTS, SPECIAL TESTS (MRI, CT, ULTRASOUNDS)  GO THE WAITING ROOM AND TELL CHECK IN YOU NEED HELP WITH A REFERRAL. Either MARION or LINDA will help you set it up.  If it is between 1-2 PM they may be at lunch.  After 5 PM, they will likely be at home.  They will call you, so please make sure the office has your correct phone number.  Referrals sometimes can be done same day if urgent, but others can take 2 or 3 days to get an appointment. Starting in 2015, many of the new Medicare insurance plans and Affordable Care Act (Obamacare) Health plans offered take much longer for referrals. They have added additional paperwork and steps.  MRI's and CT's can take up to a week for the test. (Emergencies like strokes take precedence. I will tell you if you have an emergency.)   If your referral is to an in-network [facility] office, their office may contact you directly prior to our office reaching you.  -- Examples: [facility], [facility], [facility], [facility], [facility], and many more.  Specialist appointment times vary a great deal, mostly on the specialist's schedule and if they have openings. -- Our office tries to get you in as fast as possible. -- Some specialists have very long wait times. (Example. Dermatology. Usually months) -- If you have a true emergency like new cancer, we work to get you in ASAP.   
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
## 2013-05-19 ENCOUNTER — Encounter (HOSPITAL_COMMUNITY): Payer: Self-pay

## 2013-05-20 ENCOUNTER — Other Ambulatory Visit (HOSPITAL_BASED_OUTPATIENT_CLINIC_OR_DEPARTMENT_OTHER): Payer: Medicare Other

## 2013-05-20 ENCOUNTER — Telehealth: Payer: Self-pay | Admitting: Internal Medicine

## 2013-05-20 ENCOUNTER — Encounter: Payer: Self-pay | Admitting: Internal Medicine

## 2013-05-20 ENCOUNTER — Ambulatory Visit (HOSPITAL_BASED_OUTPATIENT_CLINIC_OR_DEPARTMENT_OTHER): Payer: Medicare Other | Admitting: Internal Medicine

## 2013-05-20 ENCOUNTER — Ambulatory Visit (HOSPITAL_BASED_OUTPATIENT_CLINIC_OR_DEPARTMENT_OTHER): Payer: Medicare Other

## 2013-05-20 ENCOUNTER — Telehealth: Payer: Self-pay | Admitting: *Deleted

## 2013-05-20 ENCOUNTER — Other Ambulatory Visit: Payer: Self-pay | Admitting: Physician Assistant

## 2013-05-20 ENCOUNTER — Other Ambulatory Visit: Payer: Self-pay | Admitting: Internal Medicine

## 2013-05-20 VITALS — BP 133/57 | HR 88 | Temp 98.3°F | Resp 18 | Ht 66.0 in | Wt 222.4 lb

## 2013-05-20 DIAGNOSIS — R300 Dysuria: Secondary | ICD-10-CM

## 2013-05-20 DIAGNOSIS — C184 Malignant neoplasm of transverse colon: Secondary | ICD-10-CM | POA: Diagnosis not present

## 2013-05-20 DIAGNOSIS — R319 Hematuria, unspecified: Secondary | ICD-10-CM

## 2013-05-20 DIAGNOSIS — C8587 Other specified types of non-Hodgkin lymphoma, spleen: Secondary | ICD-10-CM

## 2013-05-20 DIAGNOSIS — R17 Unspecified jaundice: Secondary | ICD-10-CM | POA: Diagnosis not present

## 2013-05-20 DIAGNOSIS — C8190 Hodgkin lymphoma, unspecified, unspecified site: Secondary | ICD-10-CM | POA: Diagnosis not present

## 2013-05-20 LAB — CBC WITH DIFFERENTIAL/PLATELET
BASO%: 0.6 % (ref 0.0–2.0)
BASOS ABS: 0.1 10*3/uL (ref 0.0–0.1)
EOS%: 0.6 % (ref 0.0–7.0)
Eosinophils Absolute: 0.1 10*3/uL (ref 0.0–0.5)
HEMATOCRIT: 33.2 % — AB (ref 34.8–46.6)
HEMOGLOBIN: 11.1 g/dL — AB (ref 11.6–15.9)
LYMPH#: 2.9 10*3/uL (ref 0.9–3.3)
LYMPH%: 35.8 % (ref 14.0–49.7)
MCH: 31 pg (ref 25.1–34.0)
MCHC: 33.4 g/dL (ref 31.5–36.0)
MCV: 92.7 fL (ref 79.5–101.0)
MONO#: 2.5 10*3/uL — AB (ref 0.1–0.9)
MONO%: 30.2 % — ABNORMAL HIGH (ref 0.0–14.0)
NEUT#: 2.7 10*3/uL (ref 1.5–6.5)
NEUT%: 32.8 % — ABNORMAL LOW (ref 38.4–76.8)
Platelets: 170 10*3/uL (ref 145–400)
RBC: 3.58 10*6/uL — ABNORMAL LOW (ref 3.70–5.45)
RDW: 18.6 % — ABNORMAL HIGH (ref 11.2–14.5)
WBC: 8.1 10*3/uL (ref 3.9–10.3)
nRBC: 8 % — ABNORMAL HIGH (ref 0–0)

## 2013-05-20 LAB — COMPREHENSIVE METABOLIC PANEL (CC13)
ALBUMIN: 3.4 g/dL — AB (ref 3.5–5.0)
ALT: 39 U/L (ref 0–55)
AST: 98 U/L — ABNORMAL HIGH (ref 5–34)
Alkaline Phosphatase: 606 U/L — ABNORMAL HIGH (ref 40–150)
Anion Gap: 11 mEq/L (ref 3–11)
BUN: 12.8 mg/dL (ref 7.0–26.0)
CALCIUM: 9.4 mg/dL (ref 8.4–10.4)
CHLORIDE: 105 meq/L (ref 98–109)
CO2: 26 mEq/L (ref 22–29)
Creatinine: 0.7 mg/dL (ref 0.6–1.1)
GLUCOSE: 90 mg/dL (ref 70–140)
Potassium: 4.1 mEq/L (ref 3.5–5.1)
Sodium: 142 mEq/L (ref 136–145)
Total Bilirubin: 1.96 mg/dL — ABNORMAL HIGH (ref 0.20–1.20)
Total Protein: 6.1 g/dL — ABNORMAL LOW (ref 6.4–8.3)

## 2013-05-20 LAB — URINALYSIS, MICROSCOPIC - CHCC
BILIRUBIN (URINE): NEGATIVE
BLOOD: NEGATIVE
GLUCOSE UR CHCC: NEGATIVE mg/dL
KETONES: NEGATIVE mg/dL
Nitrite: NEGATIVE
Protein: NEGATIVE mg/dL
Specific Gravity, Urine: 1.01 (ref 1.003–1.035)
Urobilinogen, UR: 0.2 mg/dL (ref 0.2–1)
pH: 6 (ref 4.6–8.0)

## 2013-05-20 LAB — LACTATE DEHYDROGENASE (CC13): LDH: 829 U/L — AB (ref 125–245)

## 2013-05-20 LAB — TECHNOLOGIST REVIEW

## 2013-05-20 MED ORDER — PROCHLORPERAZINE MALEATE 10 MG PO TABS: 10.0000 mg | ORAL_TABLET | Freq: Four times a day (QID) | ORAL | Status: DC | PRN

## 2013-05-20 MED ORDER — DEXAMETHASONE 4 MG PO TABS: 8.0000 mg | ORAL_TABLET | Freq: Two times a day (BID) | ORAL | Status: DC

## 2013-05-20 MED ORDER — ONDANSETRON HCL 8 MG PO TABS: 8.0000 mg | ORAL_TABLET | Freq: Two times a day (BID) | ORAL | Status: DC

## 2013-05-20 MED ORDER — FOSFOMYCIN TROMETHAMINE 3 G PO PACK: 3.0000 g | PACK | Freq: Once | ORAL | Status: DC

## 2013-05-20 MED ORDER — LORAZEPAM 0.5 MG PO TABS: 0.5000 mg | ORAL_TABLET | Freq: Four times a day (QID) | ORAL | Status: DC | PRN

## 2013-05-20 NOTE — Progress Notes (Signed)
[name], MD Boyd [address], [name]  DIAGNOSIS: Malignant neoplasm of transverse colon  Chief Complaint  Patient presents with  . Malignant neoplasm of transverse colon   CURRENT THERAPY: Surveillance.   INTERVAL HISTORY: Diamond Boyd 73 y.o. 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  She was last seen me on 03/21/2012.  She is accompanied by her husband Diamond Boyd.  Doing her last follow-up she had a follow a CT of abdomen and pelvis which demonstrated 3 small pulmonary nodules in the RLL concerning for metastases with recommendations for a PET/CT.  PET/CT was obtained today is as outlined below.  She had ultrasound of thyroid and biopsy which was negative.  She then underwent right lower lobe lung biopsy (Dr. Greggory Keen) on 05/03/2013 with pathology consistent with Hodgkin's lymphoma.    Today, she reports continued weight lost of 5 lbs since her last visit.  Otherwsise, her energy and appetite is stable. She expresses having night sweats and mild fevers which started 2 weeks ago.  She denies specifically any pain, difficulty eating, trouble with her bowels, blood in the stool.  She is without complaints today. Patient presented to the emergency room on 04/23/13 with complaints of shortness of breath and fever and CTA of chest was as noted below. Pt states she has had pink eye in her (R) eye and was on antibiotic eye drops since wed 04/27/2013 without resolution.  Now she is on tobradex eye drops with plans for a scheduled biopsy of bottom eyelid on 04/14.    MEDICAL HISTORY: Past Medical History  Diagnosis Date  . Colon polyps   . Depression   . Hypothyroidism   . Osteoarthritis     hands  . Peripheral  vascular disease   . Degenerative disk disease     spine in center in past   . 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 Cancer     skin cancer- basal cell on arm / colon 2011  . Lymphoma   . Colon cancer     INTERIM HISTORY: has MALIGNANT NEOPLASM OF TRANSVERSE COLON; LYMPHOMA NEC, MLIG, SPLEEN; HYPERCHOLESTEROLEMIA, PURE; DISORDERS OF PHOSPHORUS METABOLISM; Anxiety state, unspecified; DEPRESSION; PERIPHERAL VASCULAR DISEASE; Other chronic sinusitis; ALLERGIC  RHINITIS; GERD; OVERACTIVE BLADDER; MENOPAUSAL SYNDROME; OSTEOARTHRITIS; LEG EDEMA; THROAT PAIN, CHRONIC; SKIN CANCER, HX OF; COLONIC POLYPS, HX OF; Elevated blood pressure; Obesity; Hypothyroid; Varicose veins; Other asplenic status; History of anemia; Screening for lipoid disorders; Other screening mammogram; Routine gynecological examination; Colon cancer screening; Skin lesion of face; Thoracic back pain; Left knee pain; Encounter for Medicare annual wellness exam; Cystocele; Herpes zoster; Vaginal pain; Other malaise and fatigue; Acute sinusitis; Left temporal headache; Hyperglycemia; Conjunctivitis, acute; and Cold sore on her problem list.    ALLERGIES:  is allergic to achromycin; allopurinol; astelin; cephalexin; codeine; meloxicam; minocycline; nabumetone; nyquil; penicillins; sulfa antibiotics; zolpidem tartrate; buspar; and ciprofloxacin.  MEDICATIONS: has a current medication list which includes the following prescription(s): acetaminophen, calcium carbonate-vitamin d, celecoxib, cholecalciferol, estradiol, fexofenadine, fluoxetine, fluticasone, levothyroxine, omega-3, omeprazole, psyllium, simvastatin, tobramycin-dexamethasone, and vitamin e, and the following Facility-Administered Medications: sodium chloride.  SURGICAL HISTORY:  Past Surgical History  Procedure Laterality Date  . Cholecystectomy    . Splenectomy  lymphoma  . Breast biopsy  1996  . Plantar fascia release    . Ventral hernia repair  1998  . Tendon release       Right thumb   . Colectomy  8/11  . Vaginal hysterectomy     PROBLEM LIST:  1. Adenocarcinoma of the colon with 2 synchronous primaries dating back to August 2011 when the patient was found to have anemia. Both tumors were associated with negative lymph nodes. One tumor was T2 N0. The other tumor was T3 N0. The T2 N0 stage I tumor was in the sigmoid colon. The T3 N0 stage II tumor was in the transverse colon. The smaller tumor measured 1.5 cm. The larger tumor measured 6.5 cm. Surgery was carried out on 09/28/2009. 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  2. History of marginal zone lymphoma probably dating back to 66. The patient had bone marrow involvement and splenomegaly at that time, also a hemolytic anemia. She underwent a splenectomy on  November 12, 1994. Through the years, the patient has received Rituxan most recently, dating back to May 2006. The patient received fludarabine in late 2002 and early 2003. She may have developed a pneumonitis from the fludarabine in December 2002. The patient has had involvement of the peripheral blood with villous lymphocytes. She appears to be in a state of complete clinical remission at this time. Tumor is CD5 and CD20 positive, also CD11c and FMC7 positive.  3. 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  4. History of lupus anticoagulant dating back to the 1990s.  5. History of iron deficiency anemia for which the patient received IV Feraheme in January 2012. Presumably, this occurred as a result of her colon cancer.  6. History of GERD resulting in cough diagnosed around 2010.  7. Dyslipidemia.  8. Hypothyroidism.  9. Irritable bowel syndrome.  10. Goiter seen on PET scan from 09/04/2009.  11. Enlarged left tonsil diagnosed April 2011, currently resolved.  12. History of depression.   REVIEW OF SYSTEMS:   Constitutional: Denies fevers, chills or abnormal weight loss Eyes: Denies  blurriness of vision Ears, nose, mouth, throat, and face: Denies mucositis or sore throat Respiratory: Denies cough, dyspnea or wheezes Cardiovascular: Denies palpitation, chest discomfort or lower extremity swelling Gastrointestinal:  Denies nausea, heartburn or change in bowel habits Skin: Denies abnormal skin rashes Lymphatics: Denies new lymphadenopathy or easy bruising Neurological:Denies numbness, tingling or new weaknesses Behavioral/Psych: Mood is stable, no new changes  All other systems were reviewed with the patient and are negative.  PHYSICAL EXAMINATION: ECOG PERFORMANCE STATUS: 0 - Asymptomatic  Blood pressure 133/57, pulse 88, temperature 98.3 F (36.8 C), temperature source Oral, resp. rate 18, height _0  (1.676 m), weight 222 lb 6.4 oz (100.88 kg), last menstrual period 02/10/1970.  GENERAL:alert, no distress and comfortable; moderately obese.  SKIN: skin color, texture, turgor are normal, no rashes or significant lesions EYES: normal, Conjunctiva are pink, sclera clear; R eye slightly injected in the medial eyelid portion.  OROPHARYNX:no exudate, no erythema and lips, buccal mucosa, and tongue normal  NECK: supple, thyroid normal size, non-tender, without nodularity.  Left neck adenopathy.   LYMPH:  no palpable lymphadenopathy in the cervical, axillary or supraclavicular LUNGS: clear to auscultation and percussion with normal breathing effort HEART: regular rate & rhythm and no murmurs and no lower extremity edema ABDOMEN:abdomen soft, non-tender and normal bowel sounds; multiple surgical scars well healed.  Musculoskeletal:no cyanosis of digits and no clubbing  NEURO: alert & oriented x 3 with fluent speech, no focal motor/sensory deficits  Labs:  Lab Results  Component Value Date   WBC 8.1 05/20/2013   HGB 11.1* 05/20/2013   HCT 33.2* 05/20/2013   MCV 92.7 05/20/2013   PLT 170 05/20/2013   NEUTROABS 2.7 05/20/2013      Chemistry      Component Value  Date/Time   NA 137 04/23/2013 1157   NA 142 03/09/2013 1029   K 4.6 04/23/2013 1157   K 4.2 03/09/2013 1029   CL 98 04/23/2013 1157   CL 105 07/14/2012 0939   CO2 22 04/23/2013 1157   CO2 28 03/09/2013 1029   BUN 11 04/23/2013 1157   BUN 8.5 03/09/2013 1029   CREATININE 0.60 04/23/2013 1157   CREATININE 0.7 03/09/2013 1029      Component Value Date/Time   CALCIUM 9.4 04/23/2013 1157   CALCIUM 9.9 03/09/2013 1029   CALCIUM 9.3 08/09/2009 0000   ALKPHOS 183* 03/09/2013 1029   ALKPHOS 82 09/17/2012 1323   AST 52* 03/09/2013 1029   AST 23 09/17/2012 1323   ALT 31 03/09/2013 1029   ALT 21 09/17/2012 1323   BILITOT 0.48 03/09/2013 1029   BILITOT 0.8 09/17/2012 1323     Results for Diamond Boyd, Diamond Boyd (MRN [id number]) as of 03/21/2013 13:37  Ref. Range 01/13/2013 09:40  CEA Latest Range: 0.0-5.0 ng/mL 1.9   Results for Diamond Boyd, Diamond Boyd (MRN [id number]) as of 05/20/2013 14:06  Ref. Range 05/20/2013 11:01  LDH Latest Range: 125-245 U/L 829 (H)   IMAGING STUDIES:  1. Digital screening mammogram from 08/05/2010 was negative.  2. Chest x-Diamond Boyd, 2 view, from 12/09/2010 was negative.  3. Digital screening mammogram on 08/20/2011 showed no specific evidence of malignancy  4. Chest x-Di[place] Boyd, 2 view, from 11/19/2011 showed atherosclerotic calcifications within the arch of the aorta. There was evidence of a prior cholecystectomy and splenectomy, otherwise chest x-Diamond Boyd was negative.  5. CT scan of abdomen and pelvis with IV contrast obtained on 01/21/2012 showed no clear evidence of colon cancer metastasis. A retrocrural lymph node had increased in size but periportal adenopathy and periaortic adenopathy was stable to decreased. The retrocrural lymph node refer to measures 8 mm on image 12, increased from 5 mm on a prior study.  6. Chest x-Diamond Boyd, 2 view, from 06/01/2012 showed no acute abnormalities. There was borderline enlargement of the cardiac silhouette.  7. Chest x-Diamond Boyd, 2 view, from 03/09/2013, showed new pulmonary nodules in  the right lung. Cannot rule out  metastatic disease. Correlation with CT recommended. (reviewed by me) 8. CT scan of abdomen and pelvis with IV contrast on 03/09/2013 showed small right lower lobe pulmonary nodules measuring up to 8 mm, new, suspicious for metastases. Small right juxtadiaphragmatic, upper abdominal, retroperitoneal, and left pelvic lymph nodes, stable versus mildly increased. Prior splenectomy, cholecystectomy, and right hemicolectomy. PET-CT is suggested for further evaluation.  (reviewed by me) 9. PET/CT on 03/21/2013 revealed intensely hypermetabolic nodule within the left lobe of thyroid gland. Concern for thyroid carcinoma. 2. Two intensely hypermetabolic right lower lobe pulmonary nodules. Differential includes thyroid cancer metastasis or colon cancer metastasis. 3. Intense uptake within the right sacrum is concerning metastasis. Milder abnormal uptake within the T9 vertebral body is indeterminate but concerning for metastasis.  PROCEDURES:  Colonoscopy was carried out by Dr. Erskine Emery on 10/10/2010.  Additional imaging.  CT ANGIOGRAPHY CHEST WITH CONTRAST TECHNIQUE:  Multidetector CT imaging of the  chest was performed using the standard protocol during bolus administration of intravenous contrast. Multiplanar CT image reconstructions and MIPs were  obtained to evaluate the vascular anatomy. CONTRAST: 151mL OMNIPAQUE IOHEXOL 350 MG/ML SOLN COMPARISON: Chest CT September 29, 2009 and chest radiograph April 23, 2013 ; PET-CT March 21, 2013 FINDINGS: There is no demonstrable pulmonary embolus. There is no thoracic  aortic aneurysm or dissection. There are multiple nodular lesions throughout the lungs. The largest  nodular lesion is in the posterior segment of the right upper lobe near the major fissure measuring 2.5 x 2.4 cm. Nodular opacity scattered elsewhere throughout the lungs range in size from as small  as 3 mm to as large as 14. x 1.0 cm. Nodular lesions are noted to  varying degrees and most lobes in segments of the lungs bilaterally with a slightly greater preponderance of nodular opacities in the  lower lobes compared elsewhere. There is no lung edema or consolidation. There is a mildly prominent lymph node in the right hilum measuring 2.0 by 1.9 cm. There is a sub-carinal lymph node measuring 2.2 x 1.6 cm. There are scattered subcentimeter mediastinal lymph nodes. There are scattered foci of coronary artery calcification. Pericardium is not thickened. Visualized upper abdominal structures appear normal. There are no blastic or lytic bone lesions. There is degenerative change in the thoracic spine. There is enlargement of the left lobe of the thyroid compared to the right, as well as a focal mass in the left lobe of the thyroid, noted on recent PET examination. Review of the MIP images confirms the above findings. IMPRESSION: No demonstrable pulmonary embolus. Multiple nodular opacities in the  lungs, marginally increased from recent prior PET examination. Mild adenopathy. Mass in left lobe of thyroid. 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  PATHOLOGY: 03/31/2013    Diagnosis THYROID, FINE NEEDLE ASPIRATION, LEFT INFERIOR, (SPECIMEN 2 OF 2, COLLECTED ON 03/31/2013): BENIGN. FINDINGS CONSISTENT WITH A BENIGN FOLLICULAR NODULE. Enid Cutter MD Pathologist, Electronic Signature (Case signed 04/01/2013)  Diagnosis THYROID, FINE NEEDLE ASPIRATION LEFT MID, (SPECIMEN 1 OF 2, COLLECTED ON 09/04/2033): FOLLICULAR LESION OF UNDETERMINED SIGNIFICANCE. SEE COMMENT. COMMENT: THE SPECIMEN IS HYPERCELLULAR AND CONSISTS OF SMALL AND MEDIUM SIZED GROUPS OF FOLLICULAR EPITHELIAL CELLS. SOME CELLS SHOW HURTHLE LIKE CHANGES. THERE IS CYTOLOGIC ATYPIA IN THE FORM OF NUCLEOMEGALY AND INTRANUCLEAR GROOVES. THERE IS MINIMAL COLLOID. BASED ON THESE FINDINGS, A FOLLICULAR NEOPLASM CAN NOT BE ENTIRELY RULED OUT. Enid Cutter MD Pathologist, Electronic  Signature (Case signed 04/01/2013)  Lung, needle/core biopsy(ies), RLL sup seg nodule pet+ - MALIGNANT LYMPHORETICULAR NEOPLASM, MOST CONSISTENT WITH CLASSICAL HODGKIN LYMPHOMA. - SEE COMMENT. Diagnosis Note The core biopsies reveal a diffuse lymphohistiocytic infiltrate. There are scattered Hodgkin Reed-Sternberg like cells with prominent nuclei. There are rare lacunar like cells. There are no significant eosinophils or plasma cells seen in the background. Immunohistochemistry reveals scattered CD30 positive cells with golgi staining. The cells are also positive for CD15 and PAX-5. CD45 interpretation is hampered by diffuse background T-cells. CD3 and CD43 highlight abundant small T-cells. Only scattered B-cells are seen with CD79a and CD20. EBV in situ (EBER) is negative. CDX-2, cytokeratin 7 and cytokeratin 20 are negative for carcinoma. Overall, these findings are consistent with extranodal pulmonary involvement by classical Hodgkin lymphoma. The case was sent to [facility] for consultation (report [phone]), who agrees with the above interpretation. Dr. Juliann Mule was paged on 05/19/2013.  ASSESSMENT: Diamond Boyd 73 y.o. female with a history of Malignant neoplasm of  transverse colon now with Hodgkins lymphoma with B symptoms including (Fevers, night sweats and weight lost).   PLAN:  1. H.o of Marginal zone lymphoma now with multiple lung nodules consistent with Hodgkin's lymphoma with B symptoms.   --Her marginal zone lymphoma continues to be quiescent even though one of the lymph nodes removed at the time of colon cancer surgery in August 2011 was found to have B-cell non-Hodgkin lymphoma. Eather is on no treatment and in fact, has not required any therapy since May 2006 at which time she received Rituxan.   --Her CT of abdomen is as indicated above.  She has had  weight lost. Otherwise, she denies any additional constitutional symptoms such as fever or chills. Preliminary  biopsy from CT guided lung biopsy is negative for colon adenocarcinoma.  It appeared suspicious for lymphoproliferative disorder. The pathology reviewed internally and externally at [facility] is consistent with Hodgkin's lymphoma, classical subtype.    --We had an extensive discussion and review of her pulmonary pathology and final report conclusive for classical hodgkin's disease.  She reports night sweats which would be considered a constitutional B symptoms. Her ESR is pending.  We provided her a detailed handout and a detailed discussion about his new diagnosis of Hodgkin's lymphoma. We addressed several of his questions. The handout covers basics on lymphoma and its staging and possible treatment options. Based on advanced lymphoma, she will receive  a bone marrow biopsy. We will evaluate her ejection fraction prior to doxorubicin-containing regimens with a 2D-echoe. Patient will require a  R Port-a-catheter to be placed and pulmonary function tests.   --We will perform cheomtherapy with ABVD x 6 cycles (category 2A) q28 days (doxorubicin, bleomycin, vinblastine, and dacarbazine) (Duggan DB, JCO, 2003). We had a detailed discussion regarding the benefits of including approximately 80 percent of patients with advanced stage HL will attain a complete response after treatment with ABVD. Up to one-quarter of patients will have disease progression requiring further therapy; half of those will have long term survival with autologous hematopoietic cell transplantation. Overall survival rates at 4, 7, and 10 years are approximately 90, 75, and 55 percent, respectively.   --The risks of ABVD was provided as a handout and the patient attened chemotherapy teaching on 01/26. These risks include but are not limited to bone marrow suppression which can result in life-threatening infections, hair loss, GI disturbances, fatigue, nausea/vomiting. He understood the benefits and risk and chose to proceed. We will give Day  #1 chemotherapy as follows (pending review of her PFTs and Echo):   Day 1, Cycle 1A 06/03/2013  Doxorubicin (Adriamycin) 25 mg/m2  Vinblastine 6 mg/m2  Bleomycin 10 units/m2  Dacarbazine 375 mg/m2   -She will have Day1, Cycle 1B on 05/08. We will check weekly cbc to determine count recovery. We will obtain an restaging PET-CT following Cycle #2 (NCCN Guidelines, Version 2.2014, HODG-9) to assess response and further treatment based on [place] criteria for response.   --She has a good performance status and night sweats, fevers and weight lost are concerning for a B-symptom. Her CBC demonstrates mild anemia with normal plts. Her LDH is elevated. Her creatinine is normal. His calcium and serum potassium is normal. She has small to medium tumor burden but low risk for tumor lysis syndrome Nadara Mustard [place], et al, the Tumor Lysis Syndrome. NEJM, 2011; [id number]) and will therefore be hydrated aggressively with close monitoring.   2. Colon cancer now with multiple lung nodules in RLL.   --Delcie Roch remains clinically  stable. Her last colonoscopy was on  10/10/2010 by Dr. Deatra Ina.  We will make Dr. Deatra Ina of aware. CEA is stable at 1.9.     3. Thyroid nodule.    --Her PET/CT was also concerning for thyroid carcinoma. TSH on 01/04/2013 was 0.51.  Biopsy as reported above.    4. Right eye irritation -- She is being followed by dermatology.  She reports that she will require a biopsy of there right bottom eyelid to rule out malignancy.   5. Hyperbilirubinemia with elevated AST.  --We will obtain an abdominal ultrasound to rule out biliary obstruction.    6. Follow-up.  --We will plan to see Kenisha again in 2 weeks to start treatment for her Hodgkin lymphoma and review the results of her further testing.   All questions were answered. The patient knows to call the clinic with any problems, questions or concerns. We can certainly see the patient much sooner if necessary.  I spent 25 minutes counseling the  patient face to face. The total time spent in the appointment was 40 minutes.    Concha Norway, MD 05/20/2013 11:41 AM

## 2013-05-20 NOTE — Telephone Encounter (Signed)
Patient has been instructed to call our office or report to ER for further evaluation if she experiences any abdominal pain. She denies experiencing jaundice.  She did complain of increased urinary frequency and mild dysuria.  Her UA is suggestive of possible early UTI with positive LE, high WBCs.  We will send antibiotics to her pharmacy and patient instructed to start based on persistence of symptoms.

## 2013-05-20 NOTE — Patient Instructions (Signed)
Adult Hodgkin Lymphoma  Adult Hodgkin lymphoma is a cancer of the lymph system. Lymph tissue is found throughout the body. Hodgkin lymphoma can begin in almost any part of the body. It can spread to almost any tissue or organ in the body. Being young, female and having had the virus that causes mononucleosis are all things that make you more likely to get Adult Hodgkin Lymphoma. There are two main types of Hodgkin lymphoma:   Classical.  Nodular lymphocyte-predominant. Adult Hodgkin lymphoma is a type of cancer that develops in the lymph system. This is part of the body's immune system. The lymph system is made up of the following:  Lymph: Colorless, watery fluid that travels through the lymph system and carries white blood cells called lymphocytes. Lymphocytes protect the body against infections and the growth of tumors.  Lymph vessels: A network of thin tubes that collect lymph from different parts of the body and return it to the bloodstream.  Lymph nodes: Small, bean-shaped structures that filter lymph and store white blood cells that help fight infection and disease. When you have an infection, this is what is often called "swollen glands". Lymph nodes are located along the network of lymph vessels found throughout the body. Clusters of lymph nodes are found in the:  Underarm.  Pelvis.  Neck.  Abdomen.  Groin.  Spleen: Located on the left side of the abdomen near the stomach. This organ:  Makes lymphocytes.  Filters the blood.  Stores blood cells.  Destroys old blood cells.  Thymus: An organ in which lymphocytes grow and multiply. The thymus is in the chest behind the breastbone.  Tonsils: Two small masses of lymph tissue at the back of the throat. The tonsils produce lymphocytes.  Bone marrow: The soft, spongy tissue in the center of large bones. Bone marrow produces white blood cells including:  Lymphocytes.  Red blood cells.  Platelets. CAUSES  Risk factors for  adult Hodgkin lymphoma include the following:  Being in young or late adulthood.  Being female.  Being infected with the Epstein-Barr virus.  Having a first-degree relative (parent, brother, or sister) with Hodgkin lymphoma. SYMPTOMS  Other conditions may cause the same symptoms. A caregiver should be consulted if any of the following problems do not go away:  Painless, swollen lymph nodes in the neck, underarm, or groin.  Fever for no known reason.  Drenching night sweats.  Weight loss for no known reason.  Itchy skin.  Feeling very tired. DIAGNOSIS  Tests that examine lymph nodes are used to find and diagnose adult Hodgkin lymphoma. The following tests and procedures may be used:  Physical exam and history: A history of the your past illnesses and treatments will be taken. An exam of the body will check general signs of health. This includes looking for signs of disease. This may be paying special attention to lumps, swollen glands or anything else that seems unusual.  A complete blood count (CBC) is done. The CBC is used to test for, diagnose, and monitor many different conditions.  Your blood is checked for the following:  The number of red blood cells, white blood cells, and platelets.  The amount of hemoglobin (the protein that carries oxygen) in the red blood cells.  The portion of the sample made up of red blood cells.  Sedimentation rate: A procedure in which a sample of blood is drawn and checked for the rate at which the red blood cells settle to the bottom of the test  tube.  Blood chemistry studies: A procedure in which a blood sample is checked to measure the amounts of certain substances in the blood. An unusual (higher or lower than normal) amount of a substance can be a sign of disease in the organ or tissue that makes it.  Chest x-ray: An x-ray of the organs and bones inside the chest. An x-ray is a type of energy beam that can go through the body and onto  film, making a picture of areas inside the body.  CT scan (CAT scan): These are specialized x-rays. A dye may be injected into a vein or swallowed to help the organs or tissues show up better. For adult Hodgkin lymphoma, CT scans of the chest, abdomen, and pelvis are taken. A CT scan may also be known as:  Computed tomography.  Computerized tomography.  Computerized axial tomography.  PET scan (positron emission tomography scan): A procedure to find malignant tumor cells in the body. A small amount of radioactive glucose (sugar) is injected into a your vein. The PET scanner rotates around the body and makes a picture of where glucose is being used in the body. Malignant tumor cells show up brighter in the picture. These tumor cells are more active and take up more glucose than normal cells do.  Laparotomy: A surgical procedure in which an incision (cut) is made in the wall of the abdomen to check the inside of the abdomen for signs of disease. The size of the incision depends on the reason the laparotomy is being done. Sometimes organs are removed or tissue samples are taken and checked under a microscope for signs of disease. This procedure is done only if it is needed to make decisions about treatment.  Thoracentesis: The removal of fluid from the space between the lining of the chest and the lung. The fluid is removed using a needle. A pathologist views the fluid under a microscope to look for cancer cells.  For pregnant women with Hodgkin lymphoma, staging tests protect the fetus from harmful radiation are used. These include:  MRI (magnetic resonance imaging): A procedure that uses a magnet, radio waves, and a computer to make a series of detailed pictures of areas inside the body. This procedure is also called nuclear magnetic resonance imaging (NMRI).  Ultrasound exam: A procedure in which high-energy sound waves (ultrasound) are bounced off internal tissues or organs and make echoes. The  echoes form a picture of body tissues called a sonogram.  Often a small piece of tissue is removed. This is called a biopsy. A pathologist (specialist in looking at tissues) will examine the tissue. The specialist then looks for cancer cells, especially Reed-Sternberg cells. Reed-Sternberg cells are common in classical Hodgkin lymphoma. Below are different types of biopsies performed:  Lymph node biopsy: The removal of all or part of a lymph node.  Excisional biopsy: The removal of an entire lymph node.  Incisional biopsy: The removal of part of a lymph node.  Core biopsy: The removal of part of a lymph node using a wide needle.  Bone marrow aspiration and biopsy: The removal of bone marrow, blood, and a small piece of bone by inserting a hollow needle into the hipbone or breastbone.  Immunophenotyping: A test in which the cells in a sample of blood or bone marrow are looked at under a microscope to find out if malignant lymphocytes (cancer) began from the B lymphocytes or the T lymphocytes. STAGING After adult Hodgkin lymphoma has been diagnosed, tests  are done to find out if cancer cells have spread within the lymph system or to other parts of the body. The process used to find out if cancer has spread within the lymph system or to other parts of the body is called staging. The information gathered from the staging process determines the stage of the disease. It is important to know the stage in order to plan treatment. Adult Hodgkin lymphoma may be described as follows:  A: The patient has no symptoms.  B: The patient has symptoms such as fever, weight loss, or night sweats.  E: "E" stands for extranodal and means the cancer is found in an area or organ other than the lymph nodes or has spread to tissues beyond, but near, the major lymphatic areas.  S: "S" stands for spleen and means the cancer is found in the spleen. The following stages are used for adult Hodgkin lymphoma:   Stage I -  Cancer is found in one lymph node group.  Stage IE: Cancer is found in an area or organ other than the lymph nodes.  Stage II - Cancer is found in two or more lymph node groups on the same side of the diaphragm (the thin muscle below the lungs that helps breathing and separates the chest from the abdomen).  Stage IIE: Cancer is found in an area or organ other than the lymph nodes and in lymph nodes near that area or organ. Cancer may have spread to other lymph node groups on the same side of the diaphragm.  Stage III - Cancer is found in lymph node groups on both sides of the diaphragm.  Stage IIIE: Cancer is found in lymph node groups on both sides of the diaphragm and in an area or organ other than the lymph nodes.  Stage IIIS: Cancer is found in lymph node groups on both sides of the diaphragm and in the spleen.  Stage IIIS+E: Cancer is found in lymph node groups on both sides of the diaphragm, in an area or organ other than the lymph nodes, and in the spleen.  Stage III(1): Cancer is found only in the upper abdomen above the renal vein.  Stage III(2): Cancer is found in lymph nodes in the pelvis and/or near the aorta.  Stage IV - The cancer either may be found throughout one or more organs other than the lymph nodes and may be in lymph nodes near those organs. The cancer may also be found in one organ other than the lymph nodes and has spread to lymph nodes far away from that organ. In stage IV, Adult Hodgkin lymphoma may be grouped for treatment as follows:  Early Favorable.  Early Unfavorable.  Advanced Favorable.  Advanced Unfavorable. TREATMENT  There are different types of treatment for patients with adult Hodgkin lymphoma. The treatment is generally planned by a team of caregivers with expertise in treating lymphomas. Treatment varies with the stage of your disease. Treatment for adults may be different than treatment for children. Hodgkin lymphoma may also occur in patients  who have acquired immunodeficiency syndrome (AIDS); these patients require special treatment. Choosing the most appropriate cancer treatment is a decision that ideally involves the patient, family, and health care team. Three types of standard treatment are used:   Chemotherapy. This is treatment with medications which kill cancer.  Radiation therapy. This is high dose x-ray treatment of the tumor.  Surgery. This is done to remove areas high in tumor such as the spleen. New  types of treatment are being tested in clinical trials. These include the following:   High-dose chemotherapy.  Radiation therapy with stem cell transplant. HODGKIN LYMPHOMA DURING PREGNANCY When Hodgkin lymphoma is diagnosed in the second half of pregnancy, most patients can delay treatment until after the baby is born. Treatment of Hodgkin lymphoma during the second half of pregnancy may include the following:   Watchful waiting, with plans to induce delivery when the fetus is 53 to 53 weeks old.  Systemic chemotherapy using one or more drugs.  Steroid therapy.  Radiation therapy to relieve breathing problems caused by a large tumor in the chest.  Recurrent Adult Hodgkin Lymphoma. Treatment of recurrent Hodgkin lymphoma may include the following:  Combination chemotherapy.  Combination chemotherapy followed by high-dose chemotherapy and stem cell transplant with or without radiation therapy.  Radiation therapy with or without chemotherapy.  Chemotherapy as palliative therapy to relieve symptoms and improve quality of life.  A clinical trial of high-dose chemotherapy and stem cell transplant.  This summary section refers to specific treatments under study in clinical trials, but it may not mention every new treatment being studied. Information about ongoing clinical trials is available from the [employer] site. For Hodgkin lymphoma during pregnancy, treatment options also depend on:  The wishes of the  patient.  The age of the fetus. Adult Hodgkin lymphoma can often be cured if found and treated early. [place]: www.cancer.gov Document Released: 05/06/2007 Document Revised: 04/21/2011 Document Reviewed: 05/06/2007 [facility] Patient Information 2014 [place], [place]. Dacarbazine, DTIC injection What is this medicine? DACARBAZINE (da KAR ba zeen) is a chemotherapy drug. This medicine is used to treat skin cancer. It is also used with other medicines to treat Hodgkin's disease. This medicine may be used for other purposes; ask your health care provider or pharmacist if you have questions. COMMON BRAND NAME(S): DTIC-Dome What should I tell my health care provider before I take this medicine? They need to know if you have any of these conditions: -infection (especially virus infection such as chickenpox, cold sores, or herpes) -kidney disease -liver disease -low blood counts like low platelets, red blood cells, white blood cells -recent radiation therapy -an unusual or allergic reaction to dacarbazine, other chemotherapy agents, other medicines, foods, dyes, or preservatives -pregnant or trying to get pregnant -breast-feeding How should I use this medicine? This drug is given as an injection or infusion into a vein. It is administered in a hospital or clinic by a specially trained health care professional. Talk to your pediatrician regarding the use of this medicine in children. While this drug may be prescribed for selected conditions, precautions do apply. Overdosage: If you think you have taken too much of this medicine contact a poison control center or emergency room at once. NOTE: This medicine is only for you. Do not share this medicine with others. What if I miss a dose? It is important not to miss your dose. Call your doctor or health care professional if you are unable to keep an appointment. What may interact with this medicine? -medicines to  increase blood counts like filgrastim, pegfilgrastim, sargramostim -vaccines This list may not describe all possible interactions. Give your health care provider a list of all the medicines, herbs, non-prescription drugs, or dietary supplements you use. Also tell them if you smoke, drink alcohol, or use illegal drugs. Some items may interact with your medicine. What should I watch for while using this medicine? Your condition will be monitored carefully while  you are receiving this medicine. You will need important blood work done while you are taking this medicine. This drug may make you feel generally unwell. This is not uncommon, as chemotherapy can affect healthy cells as well as cancer cells. Report any side effects. Continue your course of treatment even though you feel ill unless your doctor tells you to stop. Call your doctor or health care professional for advice if you get a fever, chills or sore throat, or other symptoms of a cold or flu. Do not treat yourself. This drug decreases your body's ability to fight infections. Try to avoid being around people who are sick. This medicine may increase your risk to bruise or bleed. Call your doctor or health care professional if you notice any unusual bleeding. Be careful brushing and flossing your teeth or using a toothpick because you may get an infection or bleed more easily. If you have any dental work done, tell your dentist you are receiving this medicine. Avoid taking products that contain aspirin, acetaminophen, ibuprofen, naproxen, or ketoprofen unless instructed by your doctor. These medicines may hide a fever. Do not become pregnant while taking this medicine. Women should inform their doctor if they wish to become pregnant or think they might be pregnant. There is a potential for serious side effects to an unborn child. Talk to your health care professional or pharmacist for more information. Do not breast-feed an infant while taking this  medicine. What side effects may I notice from receiving this medicine? Side effects that you should report to your doctor or health care professional as soon as possible: -allergic reactions like skin rash, itching or hives, swelling of the face, lips, or tongue -low blood counts - this medicine may decrease the number of white blood cells, red blood cells and platelets. You may be at increased risk for infections and bleeding. -signs of infection - fever or chills, cough, sore throat, pain or difficulty passing urine -signs of decreased platelets or bleeding - bruising, pinpoint red spots on the skin, black, tarry stools, blood in the urine -signs of decreased red blood cells - unusually weak or tired, fainting spells, lightheadedness -breathing problems -muscle pains -pain at site where injected -trouble passing urine or change in the amount of urine -vomiting -yellowing of the eyes or skin Side effects that usually do not require medical attention (report to your doctor or health care professional if they continue or are bothersome): -diarrhea -hair loss -loss of appetite -nausea -skin more sensitive to sun or ultraviolet light -stomach upset This list may not describe all possible side effects. Call your doctor for medical advice about side effects. You may report side effects to FDA at 1-800-FDA-1088. Where should I keep my medicine? This drug is given in a hospital or clinic and will not be stored at home. NOTE: This sheet is a summary. It may not cover all possible information. If you have questions about this medicine, talk to your doctor, pharmacist, or health care provider.  2014, [employer]. (2007-05-18 16:56:39) Vinblastine injection What is this medicine? VINBLASTINE (vin BLAS teen) is a chemotherapy drug. It slows the growth of cancer cells. This medicine is used to treat many types of cancer like breast cancer, testicular cancer, Hodgkin's disease,  non-Hodgkin's lymphoma, and sarcoma. This medicine may be used for other purposes; ask your health care provider or pharmacist if you have questions. COMMON BRAND NAME(S): Velban What should I tell my health care provider before I take this medicine? They  need to know if you have any of these conditions: -blood disorders -dental disease -gout -infection (especially a virus infection such as chickenpox, cold sores, or herpes) -liver disease -lung disease -nervous system disease -recent or ongoing radiation therapy -an unusual or allergic reaction to vinblastine, other chemotherapy agents, other medicines, foods, dyes, or preservatives -pregnant or trying to get pregnant -breast-feeding How should I use this medicine? This drug is given as an infusion into a vein. It is administered in a hospital or clinic by a specially trained health care professional. If you have pain, swelling, burning or any unusual feeling around the site of your injection, tell your health care professional right away. Talk to your pediatrician regarding the use of this medicine in children. While this drug may be prescribed for selected conditions, precautions do apply. Overdosage: If you think you have taken too much of this medicine contact a poison control center or emergency room at once. NOTE: This medicine is only for you. Do not share this medicine with others. What if I miss a dose? It is important not to miss your dose. Call your doctor or health care professional if you are unable to keep an appointment. What may interact with this medicine? Do not take this medicine with any of the following medications: -erythromycin -itraconazole -mibefradil -voriconazole This medicine may also interact with the following medications: -cyclosporine -fluconazole -ketoconazole -medicines for seizures like phenytoin -medicines to increase blood counts like filgrastim, pegfilgrastim,  sargramostim -vaccines -verapamil Talk to your doctor or health care professional before taking any of these medicines: -acetaminophen -aspirin -ibuprofen -ketoprofen -naproxen This list may not describe all possible interactions. Give your health care provider a list of all the medicines, herbs, non-prescription drugs, or dietary supplements you use. Also tell them if you smoke, drink alcohol, or use illegal drugs. Some items may interact with your medicine. What should I watch for while using this medicine? Your condition will be monitored carefully while you are receiving this medicine. You will need important blood work done while you are taking this medicine. This drug may make you feel generally unwell. This is not uncommon, as chemotherapy can affect healthy cells as well as cancer cells. Report any side effects. Continue your course of treatment even though you feel ill unless your doctor tells you to stop. In some cases, you may be given additional medicines to help with side effects. Follow all directions for their use. Call your doctor or health care professional for advice if you get a fever, chills or sore throat, or other symptoms of a cold or flu. Do not treat yourself. This drug decreases your body's ability to fight infections. Try to avoid being around people who are sick. This medicine may increase your risk to bruise or bleed. Call your doctor or health care professional if you notice any unusual bleeding. Be careful brushing and flossing your teeth or using a toothpick because you may get an infection or bleed more easily. If you have any dental work done, tell your dentist you are receiving this medicine. Avoid taking products that contain aspirin, acetaminophen, ibuprofen, naproxen, or ketoprofen unless instructed by your doctor. These medicines may hide a fever. Do not become pregnant while taking this medicine. Women should inform their doctor if they wish to become  pregnant or think they might be pregnant. There is a potential for serious side effects to an unborn child. Talk to your health care professional or pharmacist for more information. Do not breast-feed an  infant while taking this medicine. Men may have a lower sperm count while taking this medicine. Talk to your doctor if you plan to father a child. What side effects may I notice from receiving this medicine? Side effects that you should report to your doctor or health care professional as soon as possible: -allergic reactions like skin rash, itching or hives, swelling of the face, lips, or tongue -low blood counts - This drug may decrease the number of white blood cells, red blood cells and platelets. You may be at increased risk for infections and bleeding. -signs of infection - fever or chills, cough, sore throat, pain or difficulty passing urine -signs of decreased platelets or bleeding - bruising, pinpoint red spots on the skin, black, tarry stools, nosebleeds -signs of decreased red blood cells - unusually weak or tired, fainting spells, lightheadedness -breathing problems -changes in hearing -change in the amount of urine -chest pain -high blood pressure -mouth sores -nausea and vomiting -pain, swelling, redness or irritation at the injection site -pain, tingling, numbness in the hands or feet -problems with balance, dizziness -seizures Side effects that usually do not require medical attention (report to your doctor or health care professional if they continue or are bothersome): -constipation -hair loss -jaw pain -loss of appetite -sensitivity to light -stomach pain -tumor pain This list may not describe all possible side effects. Call your doctor for medical advice about side effects. You may report side effects to FDA at 1-800-FDA-1088. Where should I keep my medicine? This drug is given in a hospital or clinic and will not be stored at home. NOTE: This sheet is a summary. It  may not cover all possible information. If you have questions about this medicine, talk to your doctor, pharmacist, or health care provider.  2014, [employer] Standard. (2007-10-25 17:15:59) Bleomycin injection What is this medicine? BLEOMYCIN (blee oh MYE sin) is a chemotherapy drug. It is used to treat many kinds of cancer like lymphoma, cervical cancer, head and neck cancer, and testicular cancer. It is also used to prevent and to treat fluid build-up around the lungs caused by some cancers. This medicine may be used for other purposes; ask your health care provider or pharmacist if you have questions. COMMON BRAND NAME(S): Blenoxane What should I tell my health care provider before I take this medicine? They need to know if you have any of these conditions: -cigarette smoker -kidney disease -lung disease -recent or ongoing radiation therapy -an unusual or allergic reaction to bleomycin, other chemotherapy agents, other medicines, foods, dyes, or preservatives -pregnant or trying to get pregnant -breast-feeding How should I use this medicine? This drug is given as an infusion into a vein or a body cavity. It can also be given as an injection into a muscle or under the skin. It is administered in a hospital or clinic by a specially trained health care professional. Talk to your pediatrician regarding the use of this medicine in children. Special care may be needed. Overdosage: If you think you have taken too much of this medicine contact a poison control center or emergency room at once. NOTE: This medicine is only for you. Do not share this medicine with others. What if I miss a dose? It is important not to miss your dose. Call your doctor or health care professional if you are unable to keep an appointment. What may interact with this medicine? -certain antibiotics given by injection -cisplatin -cyclosporine -diuretics -foscarnet -medicines to increase blood counts like  filgrastim,  pegfilgrastim, sargramostim -vaccines This list may not describe all possible interactions. Give your health care provider a list of all the medicines, herbs, non-prescription drugs, or dietary supplements you use. Also tell them if you smoke, drink alcohol, or use illegal drugs. Some items may interact with your medicine. What should I watch for while using this medicine? Visit your doctor for checks on your progress. This drug may make you feel generally unwell. This is not uncommon, as chemotherapy can affect healthy cells as well as cancer cells. Report any side effects. Continue your course of treatment even though you feel ill unless your doctor tells you to stop. Call your doctor or health care professional for advice if you get a fever, chills or sore throat, or other symptoms of a cold or flu. Do not treat yourself. This drug decreases your body's ability to fight infections. Try to avoid being around people who are sick. Avoid taking products that contain aspirin, acetaminophen, ibuprofen, naproxen, or ketoprofen unless instructed by your doctor. These medicines may hide a fever. Do not become pregnant while taking this medicine. Women should inform their doctor if they wish to become pregnant or think they might be pregnant. There is a potential for serious side effects to an unborn child. Talk to your health care professional or pharmacist for more information. Do not breast-feed an infant while taking this medicine. There is a maximum amount of this medicine you should receive throughout your life. The amount depends on the medical condition being treated and your overall health. Your doctor will watch how much of this medicine you receive in your lifetime. Tell your doctor if you have taken this medicine before. What side effects may I notice from receiving this medicine? Side effects that you should report to your doctor or health care professional as soon as possible: -allergic  reactions like skin rash, itching or hives, swelling of the face, lips, or tongue -breathing problems -chest pain -confusion -cough -fast, irregular heartbeat -feeling faint or lightheaded, falls -fever or chills -mouth sores -pain, tingling, numbness in the hands or feet -trouble passing urine or change in the amount of urine -yellowing of the eyes or skin Side effects that usually do not require medical attention (report to your doctor or health care professional if they continue or are bothersome): -darker skin color -hair loss -irritation at site where injected -loss of appetite -nail changes -nausea and vomiting -weight loss This list may not describe all possible side effects. Call your doctor for medical advice about side effects. You may report side effects to FDA at 1-800-FDA-1088. Where should I keep my medicine? This drug is given in a hospital or clinic and will not be stored at home. NOTE: This sheet is a summary. It may not cover all possible information. If you have questions about this medicine, talk to your doctor, pharmacist, or health care provider.  2014, Elsevier/Gold Standard. (2012-05-25 09:36:48) Doxorubicin injection What is this medicine? DOXORUBICIN (dox oh ROO bi sin) is a chemotherapy drug. It is used to treat many kinds of cancer like Hodgkin's disease, leukemia, non-Hodgkin's lymphoma, neuroblastoma, sarcoma, and Wilms' tumor. It is also used to treat bladder cancer, breast cancer, lung cancer, ovarian cancer, stomach cancer, and thyroid cancer. This medicine may be used for other purposes; ask your health care provider or pharmacist if you have questions. COMMON BRAND NAME(S): Adriamycin PFS, Adriamycin RDF, Adriamycin, Rubex What should I tell my health care provider before I take this medicine? They need to  know if you have any of these conditions: -blood disorders -heart disease, recent heart attack -infection (especially a virus infection such as  chickenpox, cold sores, or herpes) -irregular heartbeat -liver disease -recent or ongoing radiation therapy -an unusual or allergic reaction to doxorubicin, other chemotherapy agents, other medicines, foods, dyes, or preservatives -pregnant or trying to get pregnant -breast-feeding How should I use this medicine? This drug is given as an infusion into a vein. It is administered in a hospital or clinic by a specially trained health care professional. If you have pain, swelling, burning or any unusual feeling around the site of your injection, tell your health care professional right away. Talk to your pediatrician regarding the use of this medicine in children. Special care may be needed. Overdosage: If you think you have taken too much of this medicine contact a poison control center or emergency room at once. NOTE: This medicine is only for you. Do not share this medicine with others. What if I miss a dose? It is important not to miss your dose. Call your doctor or health care professional if you are unable to keep an appointment. What may interact with this medicine? Do not take this medicine with any of the following medications: -cisapride -droperidol -halofantrine -pimozide -zidovudine This medicine may also interact with the following medications: -chloroquine -chlorpromazine -clarithromycin -cyclophosphamide -cyclosporine -erythromycin -medicines for depression, anxiety, or psychotic disturbances -medicines for irregular heart beat like amiodarone, bepridil, dofetilide, encainide, flecainide, propafenone, quinidine -medicines for seizures like ethotoin, fosphenytoin, phenytoin -medicines for nausea, vomiting like dolasetron, ondansetron, palonosetron -medicines to increase blood counts like filgrastim, pegfilgrastim, sargramostim -methadone -methotrexate -pentamidine -progesterone -vaccines -verapamil Talk to your doctor or health care professional before taking any of  these medicines: -acetaminophen -aspirin -ibuprofen -ketoprofen -naproxen This list may not describe all possible interactions. Give your health care provider a list of all the medicines, herbs, non-prescription drugs, or dietary supplements you use. Also tell them if you smoke, drink alcohol, or use illegal drugs. Some items may interact with your medicine. What should I watch for while using this medicine? Your condition will be monitored carefully while you are receiving this medicine. You will need important blood work done while you are taking this medicine. This drug may make you feel generally unwell. This is not uncommon, as chemotherapy can affect healthy cells as well as cancer cells. Report any side effects. Continue your course of treatment even though you feel ill unless your doctor tells you to stop. Your urine may turn red for a few days after your dose. This is not blood. If your urine is dark or brown, call your doctor. In some cases, you may be given additional medicines to help with side effects. Follow all directions for their use. Call your doctor or health care professional for advice if you get a fever, chills or sore throat, or other symptoms of a cold or flu. Do not treat yourself. This drug decreases your body's ability to fight infections. Try to avoid being around people who are sick. This medicine may increase your risk to bruise or bleed. Call your doctor or health care professional if you notice any unusual bleeding. Be careful brushing and flossing your teeth or using a toothpick because you may get an infection or bleed more easily. If you have any dental work done, tell your dentist you are receiving this medicine. Avoid taking products that contain aspirin, acetaminophen, ibuprofen, naproxen, or ketoprofen unless instructed by your doctor. These medicines may hide  a fever. Men and women of childbearing age should use effective birth control methods while using  taking this medicine. Do not become pregnant while taking this medicine. There is a potential for serious side effects to an unborn child. Talk to your health care professional or pharmacist for more information. Do not breast-feed an infant while taking this medicine. Do not let others touch your urine or other body fluids for 5 days after each treatment with this medicine. Caregivers should wear latex gloves to avoid touching body fluids during this time. There is a maximum amount of this medicine you should receive throughout your life. The amount depends on the medical condition being treated and your overall health. Your doctor will watch how much of this medicine you receive in your lifetime. Tell your doctor if you have taken this medicine before. What side effects may I notice from receiving this medicine? Side effects that you should report to your doctor or health care professional as soon as possible: -allergic reactions like skin rash, itching or hives, swelling of the face, lips, or tongue -low blood counts - this medicine may decrease the number of white blood cells, red blood cells and platelets. You may be at increased risk for infections and bleeding. -signs of infection - fever or chills, cough, sore throat, pain or difficulty passing urine -signs of decreased platelets or bleeding - bruising, pinpoint red spots on the skin, black, tarry stools, blood in the urine -signs of decreased red blood cells - unusually weak or tired, fainting spells, lightheadedness -breathing problems -chest pain -fast, irregular heartbeat -mouth sores -nausea, vomiting -pain, swelling, redness at site where injected -pain, tingling, numbness in the hands or feet -swelling of ankles, feet, or hands -unusual bleeding or bruising Side effects that usually do not require medical attention (report to your doctor or health care professional if they continue or are bothersome): -diarrhea -facial  flushing -hair loss -loss of appetite -missed menstrual periods -nail discoloration or damage -red or watery eyes -red colored urine -stomach upset This list may not describe all possible side effects. Call your doctor for medical advice about side effects. You may report side effects to FDA at 1-800-FDA-1088. Where should I keep my medicine? This drug is given in a hospital or clinic and will not be stored at home. NOTE: This sheet is a summary. It may not cover all possible information. If you have questions about this medicine, talk to your doctor, pharmacist, or health care provider.  2014, Elsevier/Gold Standard. (2012-05-25 09:54:34)

## 2013-05-20 NOTE — Telephone Encounter (Signed)
Gave pt appt for lab and MD, pt sent to labs today, 2-d echo needs precert linda aware, Genetic consult template not available, emailed Sharyn Lull regarding chemo being move to 4/24 per Dr. Juliann Mule

## 2013-05-20 NOTE — Telephone Encounter (Signed)
Per staff message and POF I have scheduled appts.  JMW  

## 2013-05-22 LAB — URINE CULTURE

## 2013-05-23 ENCOUNTER — Other Ambulatory Visit: Payer: Self-pay | Admitting: Radiology

## 2013-05-23 ENCOUNTER — Encounter (HOSPITAL_COMMUNITY): Payer: Self-pay | Admitting: Pharmacy Technician

## 2013-05-24 ENCOUNTER — Ambulatory Visit (HOSPITAL_COMMUNITY)
Admission: RE | Admit: 2013-05-24 | Discharge: 2013-05-24 | Disposition: A | Discharge status: Home / Self Care | Payer: Medicare Other | Source: Ambulatory Visit | Attending: Internal Medicine | Admitting: Internal Medicine

## 2013-05-24 DIAGNOSIS — R799 Abnormal finding of blood chemistry, unspecified: Secondary | ICD-10-CM | POA: Insufficient documentation

## 2013-05-24 DIAGNOSIS — K7689 Other specified diseases of liver: Secondary | ICD-10-CM | POA: Insufficient documentation

## 2013-05-24 DIAGNOSIS — R935 Abnormal findings on diagnostic imaging of other abdominal regions, including retroperitoneum: Secondary | ICD-10-CM | POA: Diagnosis not present

## 2013-05-24 DIAGNOSIS — Z9089 Acquired absence of other organs: Secondary | ICD-10-CM | POA: Diagnosis not present

## 2013-05-25 ENCOUNTER — Telehealth: Payer: Self-pay | Admitting: Internal Medicine

## 2013-05-25 ENCOUNTER — Other Ambulatory Visit: Payer: Self-pay | Admitting: Radiology

## 2013-05-25 NOTE — Telephone Encounter (Signed)
Talked to pt and gave her appt for 2-D echo 4/16

## 2013-05-25 NOTE — Telephone Encounter (Signed)
Talked to pt and gave her genetic consult for 5/8

## 2013-05-26 ENCOUNTER — Ambulatory Visit (HOSPITAL_COMMUNITY)
Admission: RE | Admit: 2013-05-26 | Discharge: 2013-05-26 | Disposition: A | Discharge status: Home / Self Care | Payer: Medicare Other | Source: Ambulatory Visit | Attending: Internal Medicine | Admitting: Internal Medicine

## 2013-05-26 ENCOUNTER — Other Ambulatory Visit: Payer: Self-pay | Admitting: Radiology

## 2013-05-26 ENCOUNTER — Encounter (HOSPITAL_COMMUNITY): Payer: Self-pay

## 2013-05-26 DIAGNOSIS — Z860100 Personal history of colon polyps, unspecified: Secondary | ICD-10-CM | POA: Insufficient documentation

## 2013-05-26 DIAGNOSIS — D704 Cyclic neutropenia: Secondary | ICD-10-CM | POA: Insufficient documentation

## 2013-05-26 DIAGNOSIS — D72821 Monocytosis (symptomatic): Secondary | ICD-10-CM | POA: Diagnosis not present

## 2013-05-26 DIAGNOSIS — C184 Malignant neoplasm of transverse colon: Secondary | ICD-10-CM

## 2013-05-26 DIAGNOSIS — I739 Peripheral vascular disease, unspecified: Secondary | ICD-10-CM | POA: Insufficient documentation

## 2013-05-26 DIAGNOSIS — C8190 Hodgkin lymphoma, unspecified, unspecified site: Secondary | ICD-10-CM | POA: Diagnosis not present

## 2013-05-26 DIAGNOSIS — Z888 Allergy status to other drugs, medicaments and biological substances status: Secondary | ICD-10-CM | POA: Diagnosis not present

## 2013-05-26 DIAGNOSIS — F3289 Other specified depressive episodes: Secondary | ICD-10-CM | POA: Diagnosis not present

## 2013-05-26 DIAGNOSIS — E039 Hypothyroidism, unspecified: Secondary | ICD-10-CM | POA: Insufficient documentation

## 2013-05-26 DIAGNOSIS — D47Z9 Other specified neoplasms of uncertain behavior of lymphoid, hematopoietic and related tissue: Secondary | ICD-10-CM | POA: Diagnosis not present

## 2013-05-26 DIAGNOSIS — Z09 Encounter for follow-up examination after completed treatment for conditions other than malignant neoplasm: Secondary | ICD-10-CM | POA: Diagnosis not present

## 2013-05-26 DIAGNOSIS — F329 Major depressive disorder, single episode, unspecified: Secondary | ICD-10-CM | POA: Diagnosis not present

## 2013-05-26 DIAGNOSIS — C8589 Other specified types of non-Hodgkin lymphoma, extranodal and solid organ sites: Secondary | ICD-10-CM | POA: Diagnosis not present

## 2013-05-26 LAB — CBC
HCT: 33.2 % — ABNORMAL LOW (ref 36.0–46.0)
Hemoglobin: 11 g/dL — ABNORMAL LOW (ref 12.0–15.0)
MCH: 32.4 pg (ref 26.0–34.0)
MCHC: 33.1 g/dL (ref 30.0–36.0)
MCV: 97.9 fL (ref 78.0–100.0)
PLATELETS: 269 10*3/uL (ref 150–400)
RBC: 3.39 MIL/uL — ABNORMAL LOW (ref 3.87–5.11)
RDW: 20.9 % — AB (ref 11.5–15.5)
WBC: 4.8 10*3/uL (ref 4.0–10.5)

## 2013-05-26 LAB — BONE MARROW EXAM: Bone Marrow Exam: 272

## 2013-05-26 LAB — PROTIME-INR
INR: 0.93 (ref 0.00–1.49)
Prothrombin Time: 12.3 seconds (ref 11.6–15.2)

## 2013-05-26 MED ORDER — FENTANYL CITRATE 0.05 MG/ML IJ SOLN
INTRAMUSCULAR | Status: AC
  Filled 2013-05-26: qty 4

## 2013-05-26 MED ORDER — FENTANYL CITRATE 0.05 MG/ML IJ SOLN
INTRAMUSCULAR | Status: AC | PRN
  Administered 2013-05-26: 100 ug via INTRAVENOUS

## 2013-05-26 MED ORDER — SODIUM CHLORIDE 0.9 % IV SOLN
INTRAVENOUS | Status: DC
  Administered 2013-05-26: 20 mL/h via INTRAVENOUS

## 2013-05-26 MED ORDER — VANCOMYCIN HCL 10 G IV SOLR
1500.0000 mg | INTRAVENOUS | Status: DC
  Filled 2013-05-26: qty 1500

## 2013-05-26 MED ORDER — MIDAZOLAM HCL 2 MG/2ML IJ SOLN
INTRAMUSCULAR | Status: AC | PRN
  Administered 2013-05-26: 2 mg via INTRAVENOUS
  Administered 2013-05-26: 1 mg via INTRAVENOUS

## 2013-05-26 MED ORDER — MIDAZOLAM HCL 2 MG/2ML IJ SOLN
INTRAMUSCULAR | Status: AC
  Filled 2013-05-26: qty 4

## 2013-05-26 NOTE — Procedures (Signed)
CT guided bone marrow biopsy.  No immediate complication.   

## 2013-05-26 NOTE — H&P (Signed)
Chief Complaint: "I'm here for a bone marrow biopsy" Referring Physician:Chism HPI: Diamond Boyd is an 73 y.o. female with Hodgkin's Lymphoma, recently diagnosed by lung nodule biopsy. She is now referred for bone marrow biopsy. She did well from her lung biopsy, no complications and tolerated sedation well. PMHx and meds reviewed.  Past Medical History:  Past Medical History  Diagnosis Date  . Colon polyps   . Depression   . Hypothyroidism   . Osteoarthritis     hands  . Peripheral vascular disease   . Degenerative disk disease     spine in center in past   . 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 Cancer     skin cancer- basal cell on arm / colon 2011  . Lymphoma   . Colon cancer     Past Surgical History:  Past Surgical History  Procedure Laterality Date  . Cholecystectomy    . Splenectomy      lymphoma  . Breast biopsy  1996  . Plantar fascia release    . Ventral hernia repair  1998  . Tendon release      Right thumb   . Colectomy  8/11  . Vaginal hysterectomy      Family History:  Family History  Problem Relation Age of Onset  . Coronary artery disease Mother   . Alcohol abuse Mother   . Cancer Father     jaw  . Diabetes Brother   . Fibromyalgia Daughter     chronic pain   . COPD Daughter   . Colon cancer Neg Hx   . Colon polyps Neg Hx   . Stomach cancer Neg Hx     Social History:  reports that she has never smoked. She has never used smokeless tobacco. She reports that she does not drink alcohol or use illicit drugs.  Allergies:  Allergies  Allergen Reactions  . Achromycin [Tetracycline Hcl]     Pt does not remember reaction  . Allopurinol     REACTION: Unsure of reaction happene years ago  . Astelin [Azelastine Hcl]     Unknown  . Cephalexin     REACTION: unsure of reaction happened yrs ago.  . Codeine Other (See Comments)    REACTION: abd. pain  . Meloxicam Other (See Comments)    REACTION: GI symptoms  . Minocycline Other (See Comments)     Abdominal pain  . Nabumetone     REACTION: reaction not known  . Nyquil [Pseudoeph-Doxylamine-Dm-Apap] Hives  . Penicillins     REACTION: reaction not known  . Sulfa Antibiotics     Gi side eff   . Zolpidem Tartrate     REACTION: feels too drugged  . Buspar [Buspirone Hcl]     Dizziness, and not as effective for anxiety  . Ciprofloxacin Rash    Medications:   Medication List    ASK your doctor about these medications       acetaminophen 500 MG tablet  Commonly known as:  TYLENOL  Take 500 mg by mouth daily as needed for mild pain or fever.     CALCIUM 600+D 600-400 MG-UNIT per tablet  Generic drug:  Calcium Carbonate-Vitamin D  Take 1 tablet by mouth daily.     celecoxib 200 MG capsule  Commonly known as:  CELEBREX  Take 200 mg by mouth daily as needed for moderate pain.     cholecalciferol 1000 UNITS tablet  Commonly known as:  VITAMIN D  Take 1,000 Units by  mouth daily.     dexamethasone 4 MG tablet  Commonly known as:  DECADRON  Take 2 tablets (8 mg total) by mouth 2 (two) times daily with a meal. Start the day after chemotherapy for 3 days.     estradiol 2 MG tablet  Commonly known as:  ESTRACE  Take 1 mg by mouth daily. Takes 1/2 tablet     fexofenadine 180 MG tablet  Commonly known as:  ALLEGRA  Take 180 mg by mouth daily.     FLUoxetine 20 MG capsule  Commonly known as:  PROZAC  Take 20 mg by mouth every morning.     fluticasone 50 MCG/ACT nasal spray  Commonly known as:  FLONASE  Place 1 spray into the nose 2 (two) times daily.     fosfomycin 3 G Pack  Commonly known as:  MONUROL  Take 3 g by mouth once.     levothyroxine 112 MCG tablet  Commonly known as:  SYNTHROID, LEVOTHROID  Take 112 mcg by mouth daily before breakfast.     LORazepam 0.5 MG tablet  Commonly known as:  ATIVAN  Take 1 tablet (0.5 mg total) by mouth every 6 (six) hours as needed (Nausea or vomiting).     Omega-3 350 MG Caps  Take 1 capsule by mouth daily.      omeprazole 40 MG capsule  Commonly known as:  PRILOSEC  Take 40 mg by mouth 2 (two) times daily.     ondansetron 8 MG tablet  Commonly known as:  ZOFRAN  Take 1 tablet (8 mg total) by mouth 2 (two) times daily. Start the day after chemo for 3 days. Then take as needed for nausea or vomiting.     prochlorperazine 10 MG tablet  Commonly known as:  COMPAZINE  Take 1 tablet (10 mg total) by mouth every 6 (six) hours as needed (Nausea or vomiting).     psyllium 58.6 % packet  Commonly known as:  METAMUCIL  Take 1-3 packets by mouth daily.     simvastatin 40 MG tablet  Commonly known as:  ZOCOR  Take 40 mg by mouth every evening.     tobramycin-dexamethasone ophthalmic ointment  Commonly known as:  TOBRADEX  Place 1 application into the right eye 3 (three) times daily.     vitamin E 400 UNIT capsule  Generic drug:  vitamin E  Take 400 Units by mouth daily.        Please HPI for pertinent positives, otherwise complete 10 system ROS negative.  Physical Exam: BP 170/69  Pulse 92  Temp(Src) 98.2 F (36.8 C) (Oral)  Resp 20  Ht 5' 6"  (1.676 m)  Wt 222 lb (100.699 kg)  BMI 35.85 kg/m2  SpO2 97%  LMP 02/10/1970 Body mass index is 35.85 kg/(m^2).   General Appearance:  Alert, cooperative, no distress, appears stated age  Head:  Normocephalic, without obvious abnormality, atraumatic  ENT: Unremarkable  Neck: Supple, symmetrical, trachea midline  Lungs:   Clear to auscultation bilaterally, no w/r/r, respirations unlabored without use of accessory muscles.  Chest Wall:  No tenderness or deformity  Heart:  Regular rate and rhythm, S1, S2 normal, no murmur, rub or gallop.  Abdomen:   Soft, non-tender, non distended.  Extremities: Extremities normal, atraumatic, no cyanosis or edema  Pulses: 2+ and symmetric  Neurologic: Normal affect, no gross deficits.   Results for orders placed during the hospital encounter of 05/26/13 (from the past 48 hour(s))  CBC  Status: Abnormal    Collection Time    05/26/13  7:25 AM      Result Value Ref Range   WBC 4.8  4.0 - 10.5 K/uL   RBC 3.39 (*) 3.87 - 5.11 MIL/uL   Hemoglobin 11.0 (*) 12.0 - 15.0 g/dL   HCT 33.2 (*) 36.0 - 46.0 %   MCV 97.9  78.0 - 100.0 fL   MCH 32.4  26.0 - 34.0 pg   MCHC 33.1  30.0 - 36.0 g/dL   RDW 20.9 (*) 11.5 - 15.5 %   Platelets 269  150 - 400 K/uL  PROTIME-INR     Status: None   Collection Time    05/26/13  7:25 AM      Result Value Ref Range   Prothrombin Time 12.3  11.6 - 15.2 seconds   INR 0.93  0.00 - 1.49   US Abdomen Limited  05/24/2013   CLINICAL DATA:  Elevated bilirubin  EXAM: US ABDOMEN LIMITED - RIGHT UPPER QUADRANT  COMPARISON:  CT 03/09/2013  FINDINGS: Gallbladder:  Prior cholecystectomy.  Common bile duct:  Diameter:  6.8 mm.  Liver:  No focal lesion identified. The liver has a slightly nodular contour or.  IMPRESSION: 1. Common bile duct is within normal limits in caliber for someone that is status post cholecystectomy. 2. Slightly irregular contour or of the liver. Correlate for any clinical signs of cirrhosis. 3. No focal liver abnormalities identified.   Electronically Signed   By: Kerby Moors M.D.   On: 05/24/2013 10:23    Assessment/Plan Hodgkin's Lymphoma For BM biopsy. Discussed procedure, risks, complications, use of sedation. Labs reviewed. Consent signed in chart  Ascencion Dike PA-C 05/26/2013, 7:58 AM

## 2013-05-26 NOTE — Discharge Instructions (Signed)
Bone Marrow Aspiration and Bone Biopsy Examination of the bone marrow is a valuable test to diagnose blood disorders. A bone marrow biopsy takes a sample of bone and a small amount of fluid and cells from inside the bone. A bone marrow aspiration removes only the marrow. Bone marrow aspiration and bone biopsies are used to stage different disorders of the blood, such as leukemia. Staging will help your caregiver understand how far the disease has progressed.  The tests are also useful in diagnosing:  Fever of unknown origin (FUO).  Bacterial infections and other widespread fungal infections.  Cancers that have spread (metastasized) to the bone marrow.  Diseases that are characterized by a deficiency of an enzyme (storage diseases). This includes:  Niemann-Pick disease.  Gaucher disease. PROCEDURE  Sites used to get samples include:   Back of your hip bone (posterior iliac crest).  Both aspiration and biopsy.  Front of your hip bone (anterior iliac crest).  Both aspiration and biopsy.  Breastbone (sternum).  Aspiration from your breastbone (done only in adults). This method is rarely used. When you get a hip bone aspiration:  You are placed lying on your side with the upper knee brought up and flexed with the lower leg straight.  The site is prepared, cleaned with an antiseptic scrub, and draped. This keeps the biopsy area clean.  The skin and the area down to the lining of the bone (periosteum) are made numb with a local anesthetic.  The bone marrow aspiration needle is inserted. You will feel pressure on your bone.  Once inside the marrow cavity, a sample of bone marrow is sucked out (aspirated) for pathology slides.  The material collected for bone marrow slides is processed immediately by a technologist.  The technician selects the marrow particles to make the slides for pathology.  The marrow aspiration needle is removed. Then pressure is applied to the site with  gauze until bleeding has stopped. Following an aspiration, a bone marrow biopsy may be performed as well. The technique for this is very similar. A dressing is then applied.  RISKS AND COMPLICATIONS  The main complications of a bone marrow aspiration and biopsy include infection and bleeding.  Complications are uncommon. The procedure may not be performed in patients with bleeding tendencies.  A very rare complication from the procedure is injury to the heart during a breastbone (sternal) marrow aspiration. Only bone marrow aspirations are performed in this area.  Long-lasting pain at the site of the bone marrow aspiration and biopsy is uncommon. Your caregiver will let you know when you are to get your results and will discuss them with you. You may make an appointment with your caregiver to find out the results. Do not assume everything is normal if you have not heard from your caregiver or the medical facility. It is important for you to follow up on all of your test results. Document Released: 01/31/2004 Document Revised: 04/21/2011 Document Reviewed: 01/25/2008 [facility] Patient Information 2014 [place].

## 2013-05-26 NOTE — Progress Notes (Signed)
*  PRELIMINARY RESULTS* Echocardiogram 2D Echocardiogram has been performed.  Diamond Boyd 05/26/2013, 1:04 PM

## 2013-05-27 ENCOUNTER — Other Ambulatory Visit: Payer: Self-pay | Admitting: Internal Medicine

## 2013-05-27 ENCOUNTER — Ambulatory Visit (HOSPITAL_COMMUNITY)
Admission: RE | Admit: 2013-05-27 | Discharge: 2013-05-27 | Disposition: A | Discharge status: Home / Self Care | Payer: Medicare Other | Source: Ambulatory Visit | Attending: Internal Medicine | Admitting: Internal Medicine

## 2013-05-27 ENCOUNTER — Encounter (HOSPITAL_COMMUNITY): Payer: Self-pay

## 2013-05-27 DIAGNOSIS — C189 Malignant neoplasm of colon, unspecified: Secondary | ICD-10-CM | POA: Diagnosis not present

## 2013-05-27 DIAGNOSIS — C8190 Hodgkin lymphoma, unspecified, unspecified site: Secondary | ICD-10-CM

## 2013-05-27 DIAGNOSIS — C8589 Other specified types of non-Hodgkin lymphoma, extranodal and solid organ sites: Secondary | ICD-10-CM | POA: Diagnosis not present

## 2013-05-27 DIAGNOSIS — C184 Malignant neoplasm of transverse colon: Secondary | ICD-10-CM

## 2013-05-27 DIAGNOSIS — Z860100 Personal history of colon polyps, unspecified: Secondary | ICD-10-CM | POA: Insufficient documentation

## 2013-05-27 DIAGNOSIS — F329 Major depressive disorder, single episode, unspecified: Secondary | ICD-10-CM | POA: Insufficient documentation

## 2013-05-27 DIAGNOSIS — I739 Peripheral vascular disease, unspecified: Secondary | ICD-10-CM | POA: Insufficient documentation

## 2013-05-27 DIAGNOSIS — E039 Hypothyroidism, unspecified: Secondary | ICD-10-CM | POA: Diagnosis not present

## 2013-05-27 DIAGNOSIS — F3289 Other specified depressive episodes: Secondary | ICD-10-CM | POA: Diagnosis not present

## 2013-05-27 HISTORY — PX: BONE MARROW BIOPSY: SHX199

## 2013-05-27 MED ORDER — FENTANYL CITRATE 0.05 MG/ML IJ SOLN
INTRAMUSCULAR | Status: AC
  Filled 2013-05-27: qty 4

## 2013-05-27 MED ORDER — MIDAZOLAM HCL 2 MG/2ML IJ SOLN
INTRAMUSCULAR | Status: AC | PRN
Start: 2013-05-27 — End: 2013-05-27
  Administered 2013-05-27: 2 mg via INTRAVENOUS
  Administered 2013-05-27: 1 mg via INTRAVENOUS

## 2013-05-27 MED ORDER — SODIUM CHLORIDE 0.9 % IV SOLN
1500.0000 mg | INTRAVENOUS | Status: AC
  Administered 2013-05-27: 1500 mg via INTRAVENOUS
  Filled 2013-05-27: qty 1500

## 2013-05-27 MED ORDER — VANCOMYCIN HCL IN DEXTROSE 1-5 GM/200ML-% IV SOLN: 1000.0000 mg | Freq: Once | INTRAVENOUS | Status: DC

## 2013-05-27 MED ORDER — SODIUM CHLORIDE 0.9 % IV SOLN
Freq: Once | INTRAVENOUS | Status: AC
  Administered 2013-05-27: 10 mL/h via INTRAVENOUS

## 2013-05-27 MED ORDER — HEPARIN SOD (PORK) LOCK FLUSH 100 UNIT/ML IV SOLN
INTRAVENOUS | Status: AC
  Filled 2013-05-27: qty 5

## 2013-05-27 MED ORDER — FENTANYL CITRATE 0.05 MG/ML IJ SOLN
INTRAMUSCULAR | Status: AC | PRN
  Administered 2013-05-27: 100 ug via INTRAVENOUS

## 2013-05-27 MED ORDER — MIDAZOLAM HCL 2 MG/2ML IJ SOLN
INTRAMUSCULAR | Status: AC
  Filled 2013-05-27: qty 4

## 2013-05-27 MED ORDER — LIDOCAINE HCL 1 % IJ SOLN
INTRAMUSCULAR | Status: AC
  Filled 2013-05-27: qty 20

## 2013-05-27 MED ORDER — HEPARIN SOD (PORK) LOCK FLUSH 100 UNIT/ML IV SOLN
500.0000 [IU] | Freq: Once | INTRAVENOUS | Status: AC
  Administered 2013-05-27: 500 [IU] via INTRAVENOUS

## 2013-05-27 NOTE — Progress Notes (Signed)
Per Ascencion Dike, PA - no need to draw labs today.  Can use labs from 05/26/13.  Garry Heater, RN 05/27/2013

## 2013-05-27 NOTE — Procedures (Signed)
R IJ Port cathter placement with US and fluoroscopy No complication No blood loss. See complete dictation in [facility] PACS.  

## 2013-05-27 NOTE — Discharge Instructions (Signed)

## 2013-05-27 NOTE — Progress Notes (Signed)
Vanc infusion completed.  Discharge paperwork reviewed and signed with pt and husband.  No further questions or concerns at this time, VSS.  IV d/c'd, pt discharged to home with husband.  Garry Heater, RN 05/27/2013

## 2013-05-27 NOTE — H&P (Signed)
Chief Complaint: "I'm here for a portacath" Referring Physician:Chism HPI: Diamond Boyd is an 73 y.o. female with Hodgkin's Lymphoma, recently diagnosed by lung nodule biopsy. She underwent bone marrow biopsy yesterday for staging purposes and is here today for port placement. She is doing well today. Minimal pain from bone marrow biopsy. PMHx and meds reviewed, no changes.  Past Medical History:  Past Medical History  Diagnosis Date  . Colon polyps   . Depression   . Hypothyroidism   . Osteoarthritis     hands  . Peripheral vascular disease   . Degenerative disk disease     spine in center in past   . 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 Cancer     skin cancer- basal cell on arm / colon 2011  . Lymphoma   . Colon cancer     Past Surgical History:  Past Surgical History  Procedure Laterality Date  . Cholecystectomy    . Splenectomy      lymphoma  . Breast biopsy  1996  . Plantar fascia release    . Ventral hernia repair  1998  . Tendon release      Right thumb   . Colectomy  8/11  . Vaginal hysterectomy    . Bone marrow biopsy  05/27/13    Family History:  Family History  Problem Relation Age of Onset  . Coronary artery disease Mother   . Alcohol abuse Mother   . Cancer Father     jaw  . Diabetes Brother   . Fibromyalgia Daughter     chronic pain   . COPD Daughter   . Colon cancer Neg Hx   . Colon polyps Neg Hx   . Stomach cancer Neg Hx     Social History:  reports that she has never smoked. She has never used smokeless tobacco. She reports that she does not drink alcohol or use illicit drugs.  Allergies:  Allergies  Allergen Reactions  . Achromycin [Tetracycline Hcl]     Pt does not remember reaction  . Allopurinol     REACTION: Unsure of reaction happene years ago  . Astelin [Azelastine Hcl]     Unknown  . Cephalexin     REACTION: unsure of reaction happened yrs ago.  . Codeine Other (See Comments)    REACTION: abd. pain  . Meloxicam Other  (See Comments)    REACTION: GI symptoms  . Minocycline Other (See Comments)    Abdominal pain  . Nabumetone     REACTION: reaction not known  . Nyquil [Pseudoeph-Doxylamine-Dm-Apap] Hives  . Penicillins     REACTION: reaction not known  . Sulfa Antibiotics     Gi side eff   . Zolpidem Tartrate     REACTION: feels too drugged  . Buspar [Buspirone Hcl]     Dizziness, and not as effective for anxiety  . Ciprofloxacin Rash    Medications:   Medication List    ASK your doctor about these medications       acetaminophen 500 MG tablet  Commonly known as:  TYLENOL  Take 500 mg by mouth daily as needed for mild pain or fever.     CALCIUM 600+D 600-400 MG-UNIT per tablet  Generic drug:  Calcium Carbonate-Vitamin D  Take 1 tablet by mouth daily.     celecoxib 200 MG capsule  Commonly known as:  CELEBREX  Take 200 mg by mouth daily as needed for moderate pain.     cholecalciferol 1000 UNITS  tablet  Commonly known as:  VITAMIN D  Take 1,000 Units by mouth daily.     dexamethasone 4 MG tablet  Commonly known as:  DECADRON  Take 2 tablets (8 mg total) by mouth 2 (two) times daily with a meal. Start the day after chemotherapy for 3 days.     estradiol 2 MG tablet  Commonly known as:  ESTRACE  Take 1 mg by mouth daily. Takes 1/2 tablet     fexofenadine 180 MG tablet  Commonly known as:  ALLEGRA  Take 180 mg by mouth daily.     FLUoxetine 20 MG capsule  Commonly known as:  PROZAC  Take 20 mg by mouth every morning.     fluticasone 50 MCG/ACT nasal spray  Commonly known as:  FLONASE  Place 1 spray into the nose 2 (two) times daily.     fosfomycin 3 G Pack  Commonly known as:  MONUROL  Take 3 g by mouth once.     levothyroxine 112 MCG tablet  Commonly known as:  SYNTHROID, LEVOTHROID  Take 112 mcg by mouth daily before breakfast.     LORazepam 0.5 MG tablet  Commonly known as:  ATIVAN  Take 1 tablet (0.5 mg total) by mouth every 6 (six) hours as needed (Nausea or  vomiting).     Omega-3 350 MG Caps  Take 1 capsule by mouth daily.     omeprazole 40 MG capsule  Commonly known as:  PRILOSEC  Take 40 mg by mouth 2 (two) times daily.     ondansetron 8 MG tablet  Commonly known as:  ZOFRAN  Take 1 tablet (8 mg total) by mouth 2 (two) times daily. Start the day after chemo for 3 days. Then take as needed for nausea or vomiting.     prochlorperazine 10 MG tablet  Commonly known as:  COMPAZINE  Take 1 tablet (10 mg total) by mouth every 6 (six) hours as needed (Nausea or vomiting).     psyllium 58.6 % packet  Commonly known as:  METAMUCIL  Take 1-3 packets by mouth daily.     simvastatin 40 MG tablet  Commonly known as:  ZOCOR  Take 40 mg by mouth every evening.     tobramycin-dexamethasone ophthalmic ointment  Commonly known as:  TOBRADEX  Place 1 application into the right eye 3 (three) times daily.     vitamin E 400 UNIT capsule  Generic drug:  vitamin E  Take 400 Units by mouth daily.        Please HPI for pertinent positives, otherwise complete 10 system ROS negative.  Physical Exam: BP 149/77  Pulse 81  Temp(Src) 96.8 F (36 C)  Resp 20  SpO2 95%  LMP 02/10/1970 There is no weight on file to calculate BMI.   General Appearance:  Alert, cooperative, no distress, appears stated age  Head:  Normocephalic, without obvious abnormality, atraumatic  ENT: Unremarkable  Neck: Supple, symmetrical, trachea midline  Lungs:   Clear to auscultation bilaterally, no w/r/r, respirations unlabored without use of accessory muscles.  Chest Wall:  No tenderness or deformity  Heart:  Regular rate and rhythm, S1, S2 normal, no murmur, rub or gallop.  Abdomen:   Soft, non-tender, non distended.  Extremities: Extremities normal, atraumatic, no cyanosis or edema  Pulses: 2+ and symmetric  Neurologic: Normal affect, no gross deficits.   Results for orders placed during the hospital encounter of 05/26/13 (from the past 48 hour(s))  CBC  Status: Abnormal   Collection Time    05/26/13  7:25 AM      Result Value Ref Range   WBC 4.8  4.0 - 10.5 K/uL   RBC 3.39 (*) 3.87 - 5.11 MIL/uL   Hemoglobin 11.0 (*) 12.0 - 15.0 g/dL   HCT 33.2 (*) 36.0 - 46.0 %   MCV 97.9  78.0 - 100.0 fL   MCH 32.4  26.0 - 34.0 pg   MCHC 33.1  30.0 - 36.0 g/dL   RDW 20.9 (*) 11.5 - 15.5 %   Platelets 269  150 - 400 K/uL  PROTIME-INR     Status: None   Collection Time    05/26/13  7:25 AM      Result Value Ref Range   Prothrombin Time 12.3  11.6 - 15.2 seconds   INR 0.93  0.00 - 1.49  BONE MARROW EXAM     Status: None   Collection Time    05/26/13  9:35 AM      Result Value Ref Range   Bone Marrow Exam 272     Comment: SEE PATHOLOGY REPORT [id number]   Ct Biopsy  05/26/2013   CLINICAL DATA:  73 year old with Hodgkin's lymphoma.  EXAM: CT GUIDED BONE MARROW ASPIRATES AND BIOPSY  Physician: Stephan Minister. Anselm Pancoast, MD  MEDICATIONS: 3 mg versed, 100 mcg fentanyl. A radiology nurse monitored the patient for moderate sedation.  ANESTHESIA/SEDATION: Sedation time: 10 min  PROCEDURE: The procedure was explained to the patient. The risks and benefits of the procedure were discussed and the patient's questions were addressed. Informed consent was obtained from the patient. The patient was placed prone on CT scan. Images of the pelvis were obtained. The right side of back was prepped and draped in sterile fashion. The skin and right posterior iliac bone were anesthetized with 1% lidocaine. 11 gauge bone needle was directed into the right iliac bone with CT guidance. Two aspirates and one core biopsy obtained. A dressing was placed over the puncture site.  FINDINGS: Bone needle directed into the posterior right iliac bone.  COMPLICATIONS: None  IMPRESSION: CT guided bone marrow aspirates and core biopsy.   Electronically Signed   By: Markus Daft M.D.   On: 05/26/2013 12:52    Assessment/Plan Hodgkin's Lymphoma For Portacath placement Discussed procedure, risks,  complications, use of sedation. Labs reviewed. Consent signed in chart  Ascencion Dike PA-C 05/27/2013, 12:45 PM

## 2013-05-30 DIAGNOSIS — H0019 Chalazion unspecified eye, unspecified eyelid: Secondary | ICD-10-CM | POA: Diagnosis not present

## 2013-06-01 ENCOUNTER — Telehealth: Payer: Self-pay | Admitting: Internal Medicine

## 2013-06-01 ENCOUNTER — Other Ambulatory Visit: Payer: Self-pay | Admitting: Internal Medicine

## 2013-06-01 DIAGNOSIS — C8190 Hodgkin lymphoma, unspecified, unspecified site: Secondary | ICD-10-CM

## 2013-06-01 NOTE — Telephone Encounter (Signed)
Talked to pt and gave her appt for pulmonary function test for tomorrow

## 2013-06-02 ENCOUNTER — Telehealth: Payer: Self-pay | Admitting: *Deleted

## 2013-06-02 ENCOUNTER — Ambulatory Visit (HOSPITAL_COMMUNITY)
Admission: RE | Admit: 2013-06-02 | Discharge: 2013-06-02 | Disposition: A | Discharge status: Home / Self Care | Payer: Medicare Other | Source: Ambulatory Visit | Attending: Internal Medicine | Admitting: Internal Medicine

## 2013-06-02 DIAGNOSIS — C184 Malignant neoplasm of transverse colon: Secondary | ICD-10-CM | POA: Diagnosis not present

## 2013-06-02 DIAGNOSIS — Z79899 Other long term (current) drug therapy: Secondary | ICD-10-CM | POA: Diagnosis not present

## 2013-06-02 DIAGNOSIS — C8190 Hodgkin lymphoma, unspecified, unspecified site: Secondary | ICD-10-CM | POA: Diagnosis not present

## 2013-06-02 LAB — CHROMOSOME ANALYSIS, BONE MARROW

## 2013-06-02 MED ORDER — ALBUTEROL SULFATE (2.5 MG/3ML) 0.083% IN NEBU
2.5000 mg | INHALATION_SOLUTION | Freq: Once | RESPIRATORY_TRACT | Status: AC
  Administered 2013-06-02: 2.5 mg via RESPIRATORY_TRACT

## 2013-06-02 NOTE — Telephone Encounter (Signed)
PT. ASKED IF IT IS OK TO TAKE THIS ANTIBIOTIC BEFORE CHEMOTHERAPY TOMORROW. SPOKE TO DR.CHISM'S NURSE, KATHY BUYCK,RN. PT. MAY TAKE HER ANTIBIOTIC BEFORE TREATMENT TOMORROW. CALLED PT. AT HOME PHONE [phone] AND LEFT THE ABOVE INFORMATION ON VOICE MAIL AS INSTRUCTED BY PT.

## 2013-06-03 ENCOUNTER — Ambulatory Visit: Payer: Medicare Other

## 2013-06-03 ENCOUNTER — Ambulatory Visit (HOSPITAL_BASED_OUTPATIENT_CLINIC_OR_DEPARTMENT_OTHER): Payer: Medicare Other

## 2013-06-03 ENCOUNTER — Other Ambulatory Visit: Payer: Medicare Other

## 2013-06-03 ENCOUNTER — Telehealth: Payer: Self-pay | Admitting: Oncology

## 2013-06-03 ENCOUNTER — Ambulatory Visit (HOSPITAL_BASED_OUTPATIENT_CLINIC_OR_DEPARTMENT_OTHER): Payer: Medicare Other | Admitting: Oncology

## 2013-06-03 ENCOUNTER — Other Ambulatory Visit (HOSPITAL_BASED_OUTPATIENT_CLINIC_OR_DEPARTMENT_OTHER): Payer: Medicare Other

## 2013-06-03 VITALS — BP 143/76 | HR 96 | Temp 100.6°F | Resp 20 | Ht 66.0 in | Wt 224.7 lb

## 2013-06-03 DIAGNOSIS — C184 Malignant neoplasm of transverse colon: Secondary | ICD-10-CM

## 2013-06-03 DIAGNOSIS — R911 Solitary pulmonary nodule: Secondary | ICD-10-CM | POA: Diagnosis not present

## 2013-06-03 DIAGNOSIS — R509 Fever, unspecified: Secondary | ICD-10-CM

## 2013-06-03 DIAGNOSIS — C8190 Hodgkin lymphoma, unspecified, unspecified site: Secondary | ICD-10-CM

## 2013-06-03 LAB — COMPREHENSIVE METABOLIC PANEL (CC13)
ALK PHOS: 359 U/L — AB (ref 40–150)
ALT: 26 U/L (ref 0–55)
AST: 57 U/L — ABNORMAL HIGH (ref 5–34)
Albumin: 3.6 g/dL (ref 3.5–5.0)
Anion Gap: 10 mEq/L (ref 3–11)
BUN: 11.5 mg/dL (ref 7.0–26.0)
CO2: 24 mEq/L (ref 22–29)
Calcium: 9.4 mg/dL (ref 8.4–10.4)
Chloride: 105 mEq/L (ref 98–109)
Creatinine: 0.7 mg/dL (ref 0.6–1.1)
GLUCOSE: 161 mg/dL — AB (ref 70–140)
Potassium: 4.1 mEq/L (ref 3.5–5.1)
Sodium: 139 mEq/L (ref 136–145)
TOTAL PROTEIN: 6 g/dL — AB (ref 6.4–8.3)
Total Bilirubin: 1.09 mg/dL (ref 0.20–1.20)

## 2013-06-03 LAB — URINALYSIS, MICROSCOPIC - CHCC
Bilirubin (Urine): NEGATIVE
Blood: NEGATIVE
GLUCOSE UR CHCC: NEGATIVE mg/dL
Ketones: NEGATIVE mg/dL
Nitrite: NEGATIVE
Protein: NEGATIVE mg/dL
RBC / HPF: NEGATIVE (ref 0–2)
SPECIFIC GRAVITY, URINE: 1.015 (ref 1.003–1.035)
Urobilinogen, UR: 0.2 mg/dL (ref 0.2–1)
pH: 8 (ref 4.6–8.0)

## 2013-06-03 LAB — CBC WITH DIFFERENTIAL/PLATELET
BASO%: 0.9 % (ref 0.0–2.0)
BASOS ABS: 0.1 10*3/uL (ref 0.0–0.1)
EOS%: 0.4 % (ref 0.0–7.0)
Eosinophils Absolute: 0 10*3/uL (ref 0.0–0.5)
HCT: 33 % — ABNORMAL LOW (ref 34.8–46.6)
HEMOGLOBIN: 10.7 g/dL — AB (ref 11.6–15.9)
LYMPH%: 20.6 % (ref 14.0–49.7)
MCH: 33 pg (ref 25.1–34.0)
MCHC: 32.4 g/dL (ref 31.5–36.0)
MCV: 101.9 fL — AB (ref 79.5–101.0)
MONO#: 1.1 10*3/uL — ABNORMAL HIGH (ref 0.1–0.9)
MONO%: 19.9 % — AB (ref 0.0–14.0)
NEUT#: 3.1 10*3/uL (ref 1.5–6.5)
NEUT%: 58.2 % (ref 38.4–76.8)
Platelets: 180 10*3/uL (ref 145–400)
RBC: 3.24 10*6/uL — ABNORMAL LOW (ref 3.70–5.45)
RDW: 19.4 % — AB (ref 11.2–14.5)
WBC: 5.4 10*3/uL (ref 3.9–10.3)
lymph#: 1.1 10*3/uL (ref 0.9–3.3)
nRBC: 1 % — ABNORMAL HIGH (ref 0–0)

## 2013-06-03 LAB — LACTATE DEHYDROGENASE (CC13): LDH: 519 U/L — AB (ref 125–245)

## 2013-06-03 LAB — SEDIMENTATION RATE: Sed Rate: 82 mm/h — ABNORMAL HIGH (ref 0–22)

## 2013-06-03 NOTE — Progress Notes (Signed)
  [facility] OFFICE PROGRESS NOTE   Diagnosis: Hodgkin's lymphoma  INTERVAL HISTORY:   Ms. Diamond Boyd is followed by Dr. Juliann Mule with a recent diagnosis of Hodgkin's lymphoma involving a lung nodule. She is scheduled to begin ABVD chemotherapy today.  She reports the onset of a fever and chills yesterday evening. No dysuria, diarrhea, or cough. She has a history of weight loss and anorexia for several months. She reports 6-7 bowel movements today, but no diarrhea.  There has been a right eye infection for approximately past one month. She is followed by ophthalmology and is using antibiotic drop and clarithromycin. A scheduled biopsy was postponed secondary to "inflammation ".  Ms. Struss underwent Port-A-Cath placement 05/27/2013.  Objective:  Vital signs in last 24 hours:  Blood pressure 143/76, pulse 96, temperature 100.6 F (38.1 C), temperature source Oral, resp. rate 20, height _0  (1.676 m), weight 224 lb 11.2 oz (101.923 kg), last menstrual period 02/10/1970.    HEENT: Erythema with a purulent discharge at the medial right eyelid/meatus, oropharynx without thrush or ulcers, neck without mass Lymphatics: No cervical, supraclavicular, axillary, or inguinal nodes Resp: Lungs clear bilaterally, no respiratory distress Cardio: Regular rate and rhythm, 2/6 systolic murmur GI: No hepatosplenomegaly, nontender Vascular: No leg edema    Portacath/PICC-without erythema  Lab Results:  Lab Results  Component Value Date   WBC 5.4 06/03/2013   HGB 10.7* 06/03/2013   HCT 33.0* 06/03/2013   MCV 101.9* 06/03/2013   PLT 180 06/03/2013   NEUTROABS 3.1 06/03/2013    Echocardiogram on 05/26/2013: Left ventricular ejection fraction 55-60%, left atrium moderately dilated  Medications: I have reviewed the patient's current medications.  Assessment/Plan:  1. 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 Colon cancer 2011 3. Hodgkin's lymphoma-status post biopsy of  a right lung nodule 05/03/2013 with the pathology confirming classic Hodgkin's lymphoma  Bone marrow biopsy 05/26/2013 consistent with bone marrow involvement by Hodgkin's lymphoma  4. Thyroid biopsy 03/31/2013 with benign pathology  5. Fever/chills beginning 06/02/2013  Disposition:  [name] has been diagnosed with Hodgkin's lymphoma. She has been symptomatic with anorexia/weight loss, and intermittent night sweats for the past several months. The fever and chills are most likely related to the Hodgkin's lymphoma as opposed to a systemic infection. No apparent source for infection upon review of her history and physical today aside from the inflammation/. What drainage at the medial right eye. She reports the eye inflammation has been present for the past month.  She is scheduled to see her ophthalmologist 06/07/2013. We decided to delay the first cycle of ABVD chemotherapy until 06/08/2013. She will return for an office visit 06/08/2013. Pulmonary function studies from 06/02/2013 are pending.  She will seek medical attention for a persistent fever or worsening eye symptoms in the interim.  Ladell Pier, MD  06/03/2013  3:17 PM

## 2013-06-03 NOTE — Telephone Encounter (Signed)
MD out of the office today...advised by desk nurse to move laterally to [facility]...done

## 2013-06-03 NOTE — Telephone Encounter (Signed)
gv and printed pt appt sched/avs

## 2013-06-06 ENCOUNTER — Telehealth: Payer: Self-pay | Admitting: Medical Oncology

## 2013-06-06 NOTE — Telephone Encounter (Signed)
Pt's husband called and states that pt is having fever and chills. Her temp is 102.1.She saw Dr. Benay Spice last week and she was to start chemo but due to her infected eye and fever he delayed a week. I asked how her eye is doing and he states it is about he same. He asked about her urine results and I informed him it was negative. He states that her urine still look dark. I asked if she is drinking and eating and he states she is drinking and eating ok. Per Dr. Juliann Mule he feels the fever is coming from the tumor and he is hoping once she starts treatment this will improve. She is taking tylenol. She will follow up with the eye MD and then she will see Dr. Juliann Mule and chemo Wed. I asked him to call back if she gets worse. He voiced understanding.

## 2013-06-07 ENCOUNTER — Emergency Department (HOSPITAL_COMMUNITY)
Admission: EM | Admit: 2013-06-07 | Discharge: 2013-06-08 | Disposition: A | Discharge status: Home / Self Care | Payer: Medicare Other | Attending: Emergency Medicine | Admitting: Emergency Medicine

## 2013-06-07 ENCOUNTER — Emergency Department (HOSPITAL_COMMUNITY): Payer: Medicare Other

## 2013-06-07 ENCOUNTER — Encounter (HOSPITAL_COMMUNITY): Payer: Self-pay | Admitting: Emergency Medicine

## 2013-06-07 DIAGNOSIS — C8190 Hodgkin lymphoma, unspecified, unspecified site: Secondary | ICD-10-CM

## 2013-06-07 DIAGNOSIS — R5381 Other malaise: Secondary | ICD-10-CM | POA: Insufficient documentation

## 2013-06-07 DIAGNOSIS — Z79899 Other long term (current) drug therapy: Secondary | ICD-10-CM | POA: Diagnosis not present

## 2013-06-07 DIAGNOSIS — Z792 Long term (current) use of antibiotics: Secondary | ICD-10-CM | POA: Insufficient documentation

## 2013-06-07 DIAGNOSIS — R509 Fever, unspecified: Secondary | ICD-10-CM | POA: Diagnosis not present

## 2013-06-07 DIAGNOSIS — M19049 Primary osteoarthritis, unspecified hand: Secondary | ICD-10-CM | POA: Diagnosis not present

## 2013-06-07 DIAGNOSIS — R0682 Tachypnea, not elsewhere classified: Secondary | ICD-10-CM | POA: Insufficient documentation

## 2013-06-07 DIAGNOSIS — Z8679 Personal history of other diseases of the circulatory system: Secondary | ICD-10-CM | POA: Diagnosis not present

## 2013-06-07 DIAGNOSIS — R5383 Other fatigue: Secondary | ICD-10-CM

## 2013-06-07 DIAGNOSIS — Z85828 Personal history of other malignant neoplasm of skin: Secondary | ICD-10-CM | POA: Insufficient documentation

## 2013-06-07 DIAGNOSIS — R197 Diarrhea, unspecified: Secondary | ICD-10-CM | POA: Insufficient documentation

## 2013-06-07 DIAGNOSIS — Z860100 Personal history of colon polyps, unspecified: Secondary | ICD-10-CM | POA: Insufficient documentation

## 2013-06-07 DIAGNOSIS — R05 Cough: Secondary | ICD-10-CM | POA: Insufficient documentation

## 2013-06-07 DIAGNOSIS — R059 Cough, unspecified: Secondary | ICD-10-CM | POA: Diagnosis not present

## 2013-06-07 DIAGNOSIS — E039 Hypothyroidism, unspecified: Secondary | ICD-10-CM | POA: Insufficient documentation

## 2013-06-07 DIAGNOSIS — N390 Urinary tract infection, site not specified: Secondary | ICD-10-CM | POA: Diagnosis not present

## 2013-06-07 DIAGNOSIS — Z85038 Personal history of other malignant neoplasm of large intestine: Secondary | ICD-10-CM | POA: Insufficient documentation

## 2013-06-07 DIAGNOSIS — H579 Unspecified disorder of eye and adnexa: Secondary | ICD-10-CM | POA: Diagnosis not present

## 2013-06-07 DIAGNOSIS — F329 Major depressive disorder, single episode, unspecified: Secondary | ICD-10-CM | POA: Insufficient documentation

## 2013-06-07 DIAGNOSIS — Z87898 Personal history of other specified conditions: Secondary | ICD-10-CM | POA: Insufficient documentation

## 2013-06-07 DIAGNOSIS — H5789 Other specified disorders of eye and adnexa: Secondary | ICD-10-CM | POA: Diagnosis not present

## 2013-06-07 DIAGNOSIS — F3289 Other specified depressive episodes: Secondary | ICD-10-CM | POA: Diagnosis not present

## 2013-06-07 DIAGNOSIS — C8192 Hodgkin lymphoma, unspecified, intrathoracic lymph nodes: Secondary | ICD-10-CM | POA: Diagnosis not present

## 2013-06-07 DIAGNOSIS — J9819 Other pulmonary collapse: Secondary | ICD-10-CM | POA: Diagnosis not present

## 2013-06-07 DIAGNOSIS — Z880 Allergy status to penicillin: Secondary | ICD-10-CM | POA: Diagnosis not present

## 2013-06-07 DIAGNOSIS — IMO0002 Reserved for concepts with insufficient information to code with codable children: Secondary | ICD-10-CM | POA: Insufficient documentation

## 2013-06-07 LAB — BASIC METABOLIC PANEL
BUN: 17 mg/dL (ref 6–23)
CO2: 21 mEq/L (ref 19–32)
Calcium: 9.3 mg/dL (ref 8.4–10.5)
Chloride: 92 mEq/L — ABNORMAL LOW (ref 96–112)
Creatinine, Ser: 0.74 mg/dL (ref 0.50–1.10)
GFR calc Af Amer: >90 mL/min (ref >90)
GFR calc non Af Amer: 82 mL/min — ABNORMAL LOW (ref >90)
Glucose, Bld: 117 mg/dL — ABNORMAL HIGH (ref 70–99)
Potassium: 4.1 mEq/L (ref 3.7–5.3)
Sodium: 131 mEq/L — ABNORMAL LOW (ref 137–147)

## 2013-06-07 LAB — URINALYSIS, ROUTINE W REFLEX MICROSCOPIC
Glucose, UA: NEGATIVE mg/dL
Hgb urine dipstick: NEGATIVE
Ketones, ur: NEGATIVE mg/dL
Nitrite: POSITIVE — AB
Protein, ur: 30 mg/dL — AB
Specific Gravity, Urine: 1.027 (ref 1.005–1.030)
Urobilinogen, UA: >8 mg/dL — ABNORMAL HIGH (ref 0.0–1.0)
pH: 7 (ref 5.0–8.0)

## 2013-06-07 LAB — I-STAT CG4 LACTIC ACID, ED
LACTIC ACID, VENOUS: 3.78 mmol/L — AB (ref 0.5–2.2)
Lactic Acid, Venous: 3.65 mmol/L — ABNORMAL HIGH (ref 0.5–2.2)

## 2013-06-07 LAB — URINE MICROSCOPIC-ADD ON

## 2013-06-07 MED ORDER — SODIUM CHLORIDE 0.9 % IV BOLUS (SEPSIS)
1000.0000 mL | Freq: Once | INTRAVENOUS | Status: AC
  Administered 2013-06-07: 1000 mL via INTRAVENOUS

## 2013-06-07 NOTE — ED Notes (Signed)
Family reports fevers today being around 100.5 and 102.4 today. Pt states that she took tylenol up to 6 tablets of tylenol through out the day. Pt reports no nausea and diarrhea x2. Pt alert and oriented in triage with some labored breathing. Family in room. Pt to start chemo Friday. Pt reports eye infection for 2 weeks.

## 2013-06-07 NOTE — ED Provider Notes (Signed)
CSN: [id number]     Arrival date & time 06/07/13  2205 History   First MD Initiated Contact with Patient 06/07/13 2308     Chief Complaint  Patient presents with  . Fever     (Consider location/radiation/quality/duration/timing/severity/associated sxs/prior Treatment) HPI Patient presents with fevers on and off for the past 2 months. She's recently been diagnosed with Hodgkin's lymphoma and is set to start chemotherapy on Friday. She states that her fever got up to 102.4 today. She's been taking frequent Tylenol. She complains of several episodes of diarrhea but specifically denies any abdominal pain, nausea or vomiting. She has had intermittent cough which is nonproductive. She denies shortness of breath but admits to generalized fatigue. Denies any rashes, headache, neck stiffness. Patient has a right eye inflammation for which she is being followed by her ophthalmologist. This month for greater than one month. She was recently started on clarithromycin with little improvement. The inflammation in her eye is unchanged. Past Medical History  Diagnosis Date  . Colon polyps   . Depression   . Hypothyroidism   . Osteoarthritis     hands  . Peripheral vascular disease   . Degenerative disk disease     spine in center in past   . 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 Cancer     skin cancer- basal cell on arm / colon 2011  . Lymphoma   . Colon cancer    Past Surgical History  Procedure Laterality Date  . Cholecystectomy    . Splenectomy      lymphoma  . Breast biopsy  1996  . Plantar fascia release    . Ventral hernia repair  1998  . Tendon release      Right thumb   . Colectomy  8/11  . Vaginal hysterectomy    . Bone marrow biopsy  05/27/13   Family History  Problem Relation Age of Onset  . Coronary artery disease Mother   . Alcohol abuse Mother   . Cancer Father     jaw  . Diabetes Brother   . Fibromyalgia Daughter     chronic pain   . COPD Daughter   . Colon cancer Neg  Hx   . Colon polyps Neg Hx   . Stomach cancer Neg Hx    History  Substance Use Topics  . Smoking status: Never Smoker   . Smokeless tobacco: Never Used  . Alcohol Use: No   OB History   Grav Para Term Preterm Abortions TAB SAB Ect Mult Living                 Review of Systems  Constitutional: Positive for fever and fatigue. Negative for chills.  HENT: Negative for congestion, rhinorrhea, sinus pressure and sore throat.   Eyes: Positive for discharge. Negative for photophobia, pain, redness and visual disturbance.  Respiratory: Positive for cough. Negative for shortness of breath and wheezing.   Cardiovascular: Negative for chest pain, palpitations and leg swelling.  Gastrointestinal: Positive for diarrhea. Negative for nausea, vomiting, abdominal pain and constipation.  Genitourinary: Negative for dysuria, frequency and flank pain.  Musculoskeletal: Negative for back pain, myalgias, neck pain and neck stiffness.  Skin: Negative for rash and wound.  Neurological: Positive for weakness (generalized). Negative for dizziness, light-headedness, numbness and headaches.  All other systems reviewed and are negative.     Allergies  Achromycin; Allopurinol; Astelin; Cephalexin; Codeine; Meloxicam; Minocycline; Nabumetone; Nyquil; Penicillins; Sulfa antibiotics; Zolpidem tartrate; Buspar; and Ciprofloxacin  Home Medications  Prior to Admission medications   Medication Sig Start Date End Date Taking? Authorizing Provider  acetaminophen (TYLENOL) 500 MG tablet Take 500 mg by mouth daily as needed for mild pain or fever.    Yes Historical Provider, MD  Calcium Carbonate-Vitamin D (CALCIUM 600+D) 600-400 MG-UNIT per tablet Take 1 tablet by mouth daily.    Yes Historical Provider, MD  celecoxib (CELEBREX) 200 MG capsule Take 200 mg by mouth daily as needed for moderate pain. 04/06/13  Yes Abner Greenspan, MD  cholecalciferol (VITAMIN D) 1000 UNITS tablet Take 1,000 Units by mouth daily.    Yes  Historical Provider, MD  estradiol (ESTRACE) 2 MG tablet Take 1 mg by mouth daily. Takes 1/2 tablet 09/22/12  Yes Marne A Tower, MD  fexofenadine (ALLEGRA) 180 MG tablet Take 180 mg by mouth daily.   Yes Historical Provider, MD  FLUoxetine (PROZAC) 20 MG capsule Take 20 mg by mouth every morning.  09/22/12 11/24/14 Yes Franklin, MD  fluticasone (FLONASE) 50 MCG/ACT nasal spray Place 1 spray into the nose 2 (two) times daily.  09/22/12  Yes Abner Greenspan, MD  levothyroxine (SYNTHROID, LEVOTHROID) 112 MCG tablet Take 112 mcg by mouth daily before breakfast. 09/22/12  Yes Abner Greenspan, MD  dexamethasone (DECADRON) 4 MG tablet Take 2 tablets (8 mg total) by mouth 2 (two) times daily with a meal. Start the day after chemotherapy for 3 days. 05/20/13   Concha Norway, MD  LORazepam (ATIVAN) 0.5 MG tablet Take 1 tablet (0.5 mg total) by mouth every 6 (six) hours as needed (Nausea or vomiting). 05/20/13   Concha Norway, MD  Omega-3 350 MG CAPS Take 1 capsule by mouth daily.    Historical Provider, MD  omeprazole (PRILOSEC) 40 MG capsule Take 40 mg by mouth 2 (two) times daily.     Historical Provider, MD  ondansetron (ZOFRAN) 8 MG tablet Take 1 tablet (8 mg total) by mouth 2 (two) times daily. Start the day after chemo for 3 days. Then take as needed for nausea or vomiting. 05/20/13   Concha Norway, MD  prochlorperazine (COMPAZINE) 10 MG tablet Take 1 tablet (10 mg total) by mouth every 6 (six) hours as needed (Nausea or vomiting). 05/20/13   Concha Norway, MD  psyllium (METAMUCIL) 58.6 % packet Take 1-3 packets by mouth daily.     Historical Provider, MD  simvastatin (ZOCOR) 40 MG tablet Take 40 mg by mouth every evening.    Historical Provider, MD  tobramycin-dexamethasone [facility]) ophthalmic ointment Place 1 application into the right eye 3 (three) times daily.    Historical Provider, MD  vitamin E (VITAMIN E) 400 UNIT capsule Take 400 Units by mouth daily.     Historical Provider, MD   BP 136/88  Pulse 100   Temp(Src) 99.6 F (37.6 C) (Oral)  Resp 20  Ht 5' 6" (1.676 m)  SpO2 98%  LMP 02/10/1970 Physical Exam  Nursing note and vitals reviewed. Constitutional: She is oriented to person, place, and time. She appears well-developed and well-nourished. No distress.  HENT:  Head: Normocephalic and atraumatic.  Mouth/Throat: Oropharynx is clear and moist. No oropharyngeal exudate.  Eyes: EOM are normal. Pupils are equal, round, and reactive to light.  Neck: Normal range of motion. Neck supple.  No Meningismus  Cardiovascular: Normal rate and regular rhythm.  Exam reveals no gallop and no friction rub.   No murmur heard. Pulmonary/Chest: Effort normal and breath sounds normal. No respiratory distress. She has no wheezes.  She has no rales.  Mild tachypnea  Abdominal: Soft. Bowel sounds are normal. She exhibits no distension and no mass. There is no tenderness. There is no rebound and no guarding.  Musculoskeletal: Normal range of motion. She exhibits no edema and no tenderness.  No CVA tenderness bilaterally. No calf swelling or pain.  Neurological: She is alert and oriented to person, place, and time.  Moves all extremities without deficit. Sensation is grossly intact.  Skin: Skin is warm and dry. No rash noted. No erythema.  Psychiatric: She has a normal mood and affect. Her behavior is normal.    ED Course  Procedures (including critical care time) Labs Review Labs Reviewed  I-STAT CG4 LACTIC ACID, ED - Abnormal; Notable for the following:    Lactic Acid, Venous 3.65 (*)    All other components within normal limits  I-STAT CG4 LACTIC ACID, ED - Abnormal; Notable for the following:    Lactic Acid, Venous 3.78 (*)    All other components within normal limits  CULTURE, BLOOD (ROUTINE X 2)  CULTURE, BLOOD (ROUTINE X 2)  CBC WITH DIFFERENTIAL  BASIC METABOLIC PANEL  URINALYSIS, ROUTINE W REFLEX MICROSCOPIC  HEPATIC FUNCTION PANEL    Imaging Review Dg Chest 2 View  06/07/2013    CLINICAL DATA:  Shortness of breath, cough and fever.  EXAM: CHEST  2 VIEW  COMPARISON:  IR FLUORO GUIDE CV LINE*R* dated 05/27/2013; DG CHEST 1 VIEW dated 05/03/2013; CT ANGIO CHEST W/CM &/OR WO/CM dated 04/23/2013  FINDINGS: Cardiac silhouette is unremarkable. Mildly calcified aortic knob. Right midlung zone nodule appears slightly smaller. Additional nodular densities in the right lung as seen on prior CT of the chest. Strandy densities in left lung zone. No pleural effusions. No pneumothorax.  Interval placement of single-lumen right chest Port-A-Cath the right internal jugular venous approach with distal tip projecting in mid superior vena cava. No pneumothorax.  Soft tissue planes and included osseous structures are nonsuspicious. Moderate degenerate change of the thoracic spine.  IMPRESSION: Left lung base atelectasis.  Slight decrease in size of right lung nodules could reflect treatment response or possible artifact, better seen on prior CT of the chest.   Electronically Signed   By: Elon Alas   On: 06/07/2013 23:30     EKG Interpretation None      MDM   Final diagnoses:  None    Patient presents with fever of unknown origin. Pathology includes fatigue and cough. She has a right lung nodule has been diagnosed with Hodgkin's lymphoma. Questionable new infiltrate compared to old chest x-ray. Elevated lactic acid.. CT of the chest to further evaluate.  Patient like gas level has improved to under 2 after IV fluids. CT shows slight increase in size of her right lung nodule. There's no infiltrated inside. The patient's questionable UTI based on her positive nitrates in the urine.  Discussed with Dr. Ammie Dalton. Agrees with plan to discharge home and followup today in the oncology office. Thorough return precautions have been given and patient and husband have voiced understanding.  Julianne Rice, MD 06/08/13 [phone]

## 2013-06-08 ENCOUNTER — Ambulatory Visit (HOSPITAL_BASED_OUTPATIENT_CLINIC_OR_DEPARTMENT_OTHER): Payer: Medicare Other | Admitting: Internal Medicine

## 2013-06-08 ENCOUNTER — Other Ambulatory Visit (HOSPITAL_BASED_OUTPATIENT_CLINIC_OR_DEPARTMENT_OTHER): Payer: Medicare Other

## 2013-06-08 ENCOUNTER — Telehealth: Payer: Self-pay | Admitting: Internal Medicine

## 2013-06-08 ENCOUNTER — Encounter (HOSPITAL_COMMUNITY): Payer: Self-pay

## 2013-06-08 ENCOUNTER — Ambulatory Visit (HOSPITAL_BASED_OUTPATIENT_CLINIC_OR_DEPARTMENT_OTHER): Payer: Medicare Other

## 2013-06-08 VITALS — BP 117/54 | HR 82 | Temp 98.8°F | Resp 26 | Ht 66.0 in | Wt 223.2 lb

## 2013-06-08 DIAGNOSIS — C8190 Hodgkin lymphoma, unspecified, unspecified site: Secondary | ICD-10-CM

## 2013-06-08 DIAGNOSIS — R911 Solitary pulmonary nodule: Secondary | ICD-10-CM

## 2013-06-08 DIAGNOSIS — Z5111 Encounter for antineoplastic chemotherapy: Secondary | ICD-10-CM | POA: Diagnosis not present

## 2013-06-08 DIAGNOSIS — C184 Malignant neoplasm of transverse colon: Secondary | ICD-10-CM | POA: Diagnosis not present

## 2013-06-08 DIAGNOSIS — Z87898 Personal history of other specified conditions: Secondary | ICD-10-CM | POA: Diagnosis not present

## 2013-06-08 DIAGNOSIS — R17 Unspecified jaundice: Secondary | ICD-10-CM | POA: Diagnosis not present

## 2013-06-08 DIAGNOSIS — R509 Fever, unspecified: Secondary | ICD-10-CM

## 2013-06-08 DIAGNOSIS — R059 Cough, unspecified: Secondary | ICD-10-CM | POA: Diagnosis not present

## 2013-06-08 DIAGNOSIS — E049 Nontoxic goiter, unspecified: Secondary | ICD-10-CM | POA: Diagnosis not present

## 2013-06-08 DIAGNOSIS — R05 Cough: Secondary | ICD-10-CM | POA: Diagnosis not present

## 2013-06-08 LAB — COMPREHENSIVE METABOLIC PANEL (CC13)
ALBUMIN: 3.1 g/dL — AB (ref 3.5–5.0)
ALT: 45 U/L (ref 0–55)
AST: 129 U/L — ABNORMAL HIGH (ref 5–34)
Alkaline Phosphatase: 528 U/L — ABNORMAL HIGH (ref 40–150)
Anion Gap: 10 mEq/L (ref 3–11)
BUN: 17.7 mg/dL (ref 7.0–26.0)
CO2: 19 meq/L — AB (ref 22–29)
Calcium: 9.3 mg/dL (ref 8.4–10.4)
Chloride: 104 mEq/L (ref 98–109)
Creatinine: 0.8 mg/dL (ref 0.6–1.1)
Glucose: 136 mg/dl (ref 70–140)
Potassium: 4 mEq/L (ref 3.5–5.1)
SODIUM: 133 meq/L — AB (ref 136–145)
TOTAL PROTEIN: 5.6 g/dL — AB (ref 6.4–8.3)
Total Bilirubin: 2.85 mg/dL — ABNORMAL HIGH (ref 0.20–1.20)

## 2013-06-08 LAB — CBC WITH DIFFERENTIAL/PLATELET
BASO%: 1.2 % (ref 0.0–2.0)
BASOS ABS: 0.1 10*3/uL (ref 0.0–0.1)
Basophils Absolute: 0.2 10*3/uL — ABNORMAL HIGH (ref 0.0–0.1)
Basophils Relative: 2 % — ABNORMAL HIGH (ref 0–1)
EOS ABS: 0 10*3/uL (ref 0.0–0.5)
EOS%: 0.1 % (ref 0.0–7.0)
Eosinophils Absolute: 0 10*3/uL (ref 0.0–0.7)
Eosinophils Relative: 0 % (ref 0–5)
HCT: 30.7 % — ABNORMAL LOW (ref 36.0–46.0)
HCT: 27.1 % — ABNORMAL LOW (ref 34.8–46.6)
HEMOGLOBIN: 9.1 g/dL — AB (ref 11.6–15.9)
Hemoglobin: 10.3 g/dL — ABNORMAL LOW (ref 12.0–15.0)
LYMPH%: 24.2 % (ref 14.0–49.7)
Lymphocytes Relative: 33 % (ref 12–46)
Lymphs Abs: 3.2 10*3/uL (ref 0.7–4.0)
MCH: 33.4 pg (ref 26.0–34.0)
MCH: 33 pg (ref 25.1–34.0)
MCHC: 33.6 g/dL (ref 30.0–36.0)
MCHC: 33.6 g/dL (ref 31.5–36.0)
MCV: 99.7 fL (ref 78.0–100.0)
MCV: 98.2 fL (ref 79.5–101.0)
MONO#: 1.5 10*3/uL — ABNORMAL HIGH (ref 0.1–0.9)
MONO%: 20.3 % — AB (ref 0.0–14.0)
Monocytes Absolute: 2 10*3/uL — ABNORMAL HIGH (ref 0.1–1.0)
Monocytes Relative: 21 % — ABNORMAL HIGH (ref 3–12)
NEUT#: 4.1 10*3/uL (ref 1.5–6.5)
NEUT%: 54.2 % (ref 38.4–76.8)
Neutro Abs: 4.3 10*3/uL (ref 1.7–7.7)
Neutrophils Relative %: 44 % (ref 43–77)
PLATELETS: 89 10*3/uL — AB (ref 145–400)
Platelets: 90 10*3/uL — ABNORMAL LOW (ref 150–400)
RBC: 3.08 MIL/uL — ABNORMAL LOW (ref 3.87–5.11)
RBC: 2.76 10*6/uL — ABNORMAL LOW (ref 3.70–5.45)
RDW: 17.9 % — ABNORMAL HIGH (ref 11.5–15.5)
RDW: 18 % — AB (ref 11.2–14.5)
WBC: 9.7 10*3/uL (ref 4.0–10.5)
WBC: 7.5 10*3/uL (ref 3.9–10.3)
lymph#: 1.8 10*3/uL (ref 0.9–3.3)
nRBC: 10 % — ABNORMAL HIGH (ref 0–0)

## 2013-06-08 LAB — TECHNOLOGIST REVIEW

## 2013-06-08 LAB — HEPATIC FUNCTION PANEL
ALT: 46 U/L — ABNORMAL HIGH (ref 0–35)
AST: 121 U/L — ABNORMAL HIGH (ref 0–37)
Albumin: 3.3 g/dL — ABNORMAL LOW (ref 3.5–5.2)
Alkaline Phosphatase: 529 U/L — ABNORMAL HIGH (ref 39–117)
BILIRUBIN DIRECT: 1 mg/dL — AB (ref 0.0–0.3)
BILIRUBIN TOTAL: 2.5 mg/dL — AB (ref 0.3–1.2)
Indirect Bilirubin: 1.5 mg/dL — ABNORMAL HIGH (ref 0.3–0.9)
Total Protein: 5.7 g/dL — ABNORMAL LOW (ref 6.0–8.3)

## 2013-06-08 LAB — LACTATE DEHYDROGENASE (CC13): LDH: 993 U/L — AB (ref 125–245)

## 2013-06-08 LAB — I-STAT CG4 LACTIC ACID, ED: Lactic Acid, Venous: 1.99 mmol/L (ref 0.5–2.2)

## 2013-06-08 MED ORDER — SODIUM CHLORIDE 0.9 % IJ SOLN
10.0000 mL | INTRAMUSCULAR | Status: DC | PRN
  Administered 2013-06-08: 10 mL
  Filled 2013-06-08: qty 10

## 2013-06-08 MED ORDER — SODIUM CHLORIDE 0.9 % IV BOLUS (SEPSIS)
500.0000 mL | Freq: Once | INTRAVENOUS | Status: AC
  Administered 2013-06-08: 500 mL via INTRAVENOUS

## 2013-06-08 MED ORDER — NITROFURANTOIN MONOHYD MACRO 100 MG PO CAPS: 100.0000 mg | ORAL_CAPSULE | Freq: Two times a day (BID) | ORAL | Status: DC

## 2013-06-08 MED ORDER — ACYCLOVIR 400 MG PO TABS: 400.0000 mg | ORAL_TABLET | Freq: Two times a day (BID) | ORAL | Status: DC

## 2013-06-08 MED ORDER — ONDANSETRON 16 MG/50ML IVPB (CHCC)
16.0000 mg | Freq: Once | INTRAVENOUS | Status: AC
  Administered 2013-06-08: 16 mg via INTRAVENOUS

## 2013-06-08 MED ORDER — ONDANSETRON 16 MG/50ML IVPB (CHCC)
INTRAVENOUS | Status: AC
  Filled 2013-06-08: qty 16

## 2013-06-08 MED ORDER — HEPARIN SOD (PORK) LOCK FLUSH 100 UNIT/ML IV SOLN
500.0000 [IU] | Freq: Once | INTRAVENOUS | Status: AC
  Administered 2013-06-08: 500 [IU]
  Filled 2013-06-08: qty 5

## 2013-06-08 MED ORDER — SODIUM CHLORIDE 0.9 % IV SOLN
Freq: Once | INTRAVENOUS | Status: AC
  Administered 2013-06-08: 15:00:00 via INTRAVENOUS

## 2013-06-08 MED ORDER — HEPARIN SOD (PORK) LOCK FLUSH 100 UNIT/ML IV SOLN
500.0000 [IU] | Freq: Once | INTRAVENOUS | Status: AC | PRN
  Administered 2013-06-08: 500 [IU]
  Filled 2013-06-08: qty 5

## 2013-06-08 MED ORDER — VINBLASTINE SULFATE CHEMO INJECTION 1 MG/ML
3.0000 mg/m2 | Freq: Once | INTRAVENOUS | Status: AC
  Administered 2013-06-08: 6.5 mg via INTRAVENOUS
  Filled 2013-06-08: qty 6.5

## 2013-06-08 MED ORDER — IOHEXOL 300 MG/ML  SOLN
100.0000 mL | Freq: Once | INTRAMUSCULAR | Status: AC | PRN
  Administered 2013-06-08: 80 mL via INTRAVENOUS

## 2013-06-08 MED ORDER — DACARBAZINE 200 MG IV SOLR
370.0000 mg/m2 | Freq: Once | INTRAVENOUS | Status: AC
  Administered 2013-06-08: 800 mg via INTRAVENOUS
  Filled 2013-06-08: qty 40

## 2013-06-08 MED ORDER — DOXORUBICIN HCL CHEMO IV INJECTION 2 MG/ML
12.5000 mg/m2 | Freq: Once | INTRAVENOUS | Status: AC
  Administered 2013-06-08: 28 mg via INTRAVENOUS
  Filled 2013-06-08: qty 14

## 2013-06-08 MED ORDER — DEXAMETHASONE SODIUM PHOSPHATE 20 MG/5ML IJ SOLN
INTRAMUSCULAR | Status: AC
Start: 2013-06-08 — End: 2013-06-08
  Filled 2013-06-08: qty 5

## 2013-06-08 MED ORDER — DEXAMETHASONE SODIUM PHOSPHATE 20 MG/5ML IJ SOLN
20.0000 mg | Freq: Once | INTRAMUSCULAR | Status: AC
  Administered 2013-06-08: 20 mg via INTRAVENOUS

## 2013-06-08 MED ORDER — NITROFURANTOIN MONOHYD MACRO 100 MG PO CAPS
100.0000 mg | ORAL_CAPSULE | Freq: Once | ORAL | Status: AC
  Administered 2013-06-08: 100 mg via ORAL
  Filled 2013-06-08: qty 1

## 2013-06-08 NOTE — ED Notes (Signed)
Pt to CT at this time.

## 2013-06-08 NOTE — Telephone Encounter (Signed)
Gave pt appt for lab,md,chemo and chemo class, emailed Sharyn Lull regarding chemo appts

## 2013-06-08 NOTE — Patient Instructions (Addendum)
[facility] Discharge Instructions for Patients Receiving Chemotherapy  Today you received the following chemotherapy agents:   To help prevent nausea and vomiting after your treatment, we encourage you to take your nausea medication.  Take it as often as prescribed.     If you develop nausea and vomiting that is not controlled by your nausea medication, call the clinic. If it is after clinic hours your family physician or the after hours number for the clinic or go to the Emergency Department.   BELOW ARE SYMPTOMS THAT SHOULD BE REPORTED IMMEDIATELY:  *FEVER GREATER THAN 100.5 F  *CHILLS WITH OR WITHOUT FEVER  NAUSEA AND VOMITING THAT IS NOT CONTROLLED WITH YOUR NAUSEA MEDICATION  *UNUSUAL SHORTNESS OF BREATH  *UNUSUAL BRUISING OR BLEEDING  TENDERNESS IN MOUTH AND THROAT WITH OR WITHOUT PRESENCE OF ULCERS  *URINARY PROBLEMS  *BOWEL PROBLEMS  UNUSUAL RASH Items with * indicate a potential emergency and should be followed up as soon as possible.  One of the nurses will contact you 24 hours after your treatment. Please let the nurse know about any problems that you may have experienced. Feel free to call the clinic you have any questions or concerns. The clinic phone number is [phone].   I have been informed and understand all the instructions given to me. I know to contact the clinic, my physician, or go to the Emergency Department if any problems should occur. I do not have any questions at this time, but understand that I may call the clinic during office hours   should I have any questions or need assistance in obtaining follow up care.    __________________________________________  _____________  __________ Signature of Patient or Authorized Representative            Date                   Time    __________________________________________ Nurse's Signature  Doxorubicin injection (adriamycin) What is this medicine? DOXORUBICIN (dox oh ROO bi  sin) is a chemotherapy drug. It is used to treat many kinds of cancer like Hodgkin's disease, leukemia, non-Hodgkin's lymphoma, neuroblastoma, sarcoma, and Wilms' tumor. It is also used to treat bladder cancer, breast cancer, lung cancer, ovarian cancer, stomach cancer, and thyroid cancer. This medicine may be used for other purposes; ask your health care provider or pharmacist if you have questions. COMMON BRAND NAME(S): Adriamycin PFS, Adriamycin RDF, Adriamycin, Rubex What should I tell my health care provider before I take this medicine? They need to know if you have any of these conditions: -blood disorders -heart disease, recent heart attack -infection (especially a virus infection such as chickenpox, cold sores, or herpes) -irregular heartbeat -liver disease -recent or ongoing radiation therapy -an unusual or allergic reaction to doxorubicin, other chemotherapy agents, other medicines, foods, dyes, or preservatives -pregnant or trying to get pregnant -breast-feeding How should I use this medicine? This drug is given as an infusion into a vein. It is administered in a hospital or clinic by a specially trained health care professional. If you have pain, swelling, burning or any unusual feeling around the site of your injection, tell your health care professional right away. Talk to your pediatrician regarding the use of this medicine in children. Special care may be needed. Overdosage: If you think you have taken too much of this medicine contact a poison control center or emergency room at once. NOTE: This medicine is only for you. Do not share this medicine with  others. What if I miss a dose? It is important not to miss your dose. Call your doctor or health care professional if you are unable to keep an appointment. What may interact with this medicine? Do not take this medicine with any of the following medications: -cisapride -droperidol -halofantrine -pimozide -zidovudine This  medicine may also interact with the following medications: -chloroquine -chlorpromazine -clarithromycin -cyclophosphamide -cyclosporine -erythromycin -medicines for depression, anxiety, or psychotic disturbances -medicines for irregular heart beat like amiodarone, bepridil, dofetilide, encainide, flecainide, propafenone, quinidine -medicines for seizures like ethotoin, fosphenytoin, phenytoin -medicines for nausea, vomiting like dolasetron, ondansetron, palonosetron -medicines to increase blood counts like filgrastim, pegfilgrastim, sargramostim -methadone -methotrexate -pentamidine -progesterone -vaccines -verapamil Talk to your doctor or health care professional before taking any of these medicines: -acetaminophen -aspirin -ibuprofen -ketoprofen -naproxen This list may not describe all possible interactions. Give your health care provider a list of all the medicines, herbs, non-prescription drugs, or dietary supplements you use. Also tell them if you smoke, drink alcohol, or use illegal drugs. Some items may interact with your medicine. What should I watch for while using this medicine? Your condition will be monitored carefully while you are receiving this medicine. You will need important blood work done while you are taking this medicine. This drug may make you feel generally unwell. This is not uncommon, as chemotherapy can affect healthy cells as well as cancer cells. Report any side effects. Continue your course of treatment even though you feel ill unless your doctor tells you to stop. Your urine may turn red for a few days after your dose. This is not blood. If your urine is dark or brown, call your doctor. In some cases, you may be given additional medicines to help with side effects. Follow all directions for their use. Call your doctor or health care professional for advice if you get a fever, chills or sore throat, or other symptoms of a cold or flu. Do not treat yourself.  This drug decreases your body's ability to fight infections. Try to avoid being around people who are sick. This medicine may increase your risk to bruise or bleed. Call your doctor or health care professional if you notice any unusual bleeding. Be careful brushing and flossing your teeth or using a toothpick because you may get an infection or bleed more easily. If you have any dental work done, tell your dentist you are receiving this medicine. Avoid taking products that contain aspirin, acetaminophen, ibuprofen, naproxen, or ketoprofen unless instructed by your doctor. These medicines may hide a fever. Men and women of childbearing age should use effective birth control methods while using taking this medicine. Do not become pregnant while taking this medicine. There is a potential for serious side effects to an unborn child. Talk to your health care professional or pharmacist for more information. Do not breast-feed an infant while taking this medicine. Do not let others touch your urine or other body fluids for 5 days after each treatment with this medicine. Caregivers should wear latex gloves to avoid touching body fluids during this time. There is a maximum amount of this medicine you should receive throughout your life. The amount depends on the medical condition being treated and your overall health. Your doctor will watch how much of this medicine you receive in your lifetime. Tell your doctor if you have taken this medicine before. What side effects may I notice from receiving this medicine? Side effects that you should report to your doctor or health care  professional as soon as possible: -allergic reactions like skin rash, itching or hives, swelling of the face, lips, or tongue -low blood counts - this medicine may decrease the number of white blood cells, red blood cells and platelets. You may be at increased risk for infections and bleeding. -signs of infection - fever or chills, cough,  sore throat, pain or difficulty passing urine -signs of decreased platelets or bleeding - bruising, pinpoint red spots on the skin, black, tarry stools, blood in the urine -signs of decreased red blood cells - unusually weak or tired, fainting spells, lightheadedness -breathing problems -chest pain -fast, irregular heartbeat -mouth sores -nausea, vomiting -pain, swelling, redness at site where injected -pain, tingling, numbness in the hands or feet -swelling of ankles, feet, or hands -unusual bleeding or bruising Side effects that usually do not require medical attention (report to your doctor or health care professional if they continue or are bothersome): -diarrhea -facial flushing -hair loss -loss of appetite -missed menstrual periods -nail discoloration or damage -red or watery eyes -red colored urine -stomach upset This list may not describe all possible side effects. Call your doctor for medical advice about side effects. You may report side effects to FDA at 1-800-FDA-1088. Where should I keep my medicine? This drug is given in a hospital or clinic and will not be stored at home. NOTE: This sheet is a summary. It may not cover all possible information. If you have questions about this medicine, talk to your doctor, pharmacist, or health care provider.  2014, [employer]. (2012-05-25 09:54:34)   Vinblastine injection What is this medicine? VINBLASTINE (vin BLAS teen) is a chemotherapy drug. It slows the growth of cancer cells. This medicine is used to treat many types of cancer like breast cancer, testicular cancer, Hodgkin's disease, non-Hodgkin's lymphoma, and sarcoma. This medicine may be used for other purposes; ask your health care provider or pharmacist if you have questions. COMMON BRAND NAME(S): Velban What should I tell my health care provider before I take this medicine? They need to know if you have any of these conditions: -blood disorders -dental  disease -gout -infection (especially a virus infection such as chickenpox, cold sores, or herpes) -liver disease -lung disease -nervous system disease -recent or ongoing radiation therapy -an unusual or allergic reaction to vinblastine, other chemotherapy agents, other medicines, foods, dyes, or preservatives -pregnant or trying to get pregnant -breast-feeding How should I use this medicine? This drug is given as an infusion into a vein. It is administered in a hospital or clinic by a specially trained health care professional. If you have pain, swelling, burning or any unusual feeling around the site of your injection, tell your health care professional right away. Talk to your pediatrician regarding the use of this medicine in children. While this drug may be prescribed for selected conditions, precautions do apply. Overdosage: If you think you have taken too much of this medicine contact a poison control center or emergency room at once. NOTE: This medicine is only for you. Do not share this medicine with others. What if I miss a dose? It is important not to miss your dose. Call your doctor or health care professional if you are unable to keep an appointment. What may interact with this medicine? Do not take this medicine with any of the following medications: -erythromycin -itraconazole -mibefradil -voriconazole This medicine may also interact with the following medications: -cyclosporine -fluconazole -ketoconazole -medicines for seizures like phenytoin -medicines to increase blood counts like filgrastim, pegfilgrastim,  sargramostim -vaccines -verapamil Talk to your doctor or health care professional before taking any of these medicines: -acetaminophen -aspirin -ibuprofen -ketoprofen -naproxen This list may not describe all possible interactions. Give your health care provider a list of all the medicines, herbs, non-prescription drugs, or dietary supplements you use. Also  tell them if you smoke, drink alcohol, or use illegal drugs. Some items may interact with your medicine. What should I watch for while using this medicine? Your condition will be monitored carefully while you are receiving this medicine. You will need important blood work done while you are taking this medicine. This drug may make you feel generally unwell. This is not uncommon, as chemotherapy can affect healthy cells as well as cancer cells. Report any side effects. Continue your course of treatment even though you feel ill unless your doctor tells you to stop. In some cases, you may be given additional medicines to help with side effects. Follow all directions for their use. Call your doctor or health care professional for advice if you get a fever, chills or sore throat, or other symptoms of a cold or flu. Do not treat yourself. This drug decreases your body's ability to fight infections. Try to avoid being around people who are sick. This medicine may increase your risk to bruise or bleed. Call your doctor or health care professional if you notice any unusual bleeding. Be careful brushing and flossing your teeth or using a toothpick because you may get an infection or bleed more easily. If you have any dental work done, tell your dentist you are receiving this medicine. Avoid taking products that contain aspirin, acetaminophen, ibuprofen, naproxen, or ketoprofen unless instructed by your doctor. These medicines may hide a fever. Do not become pregnant while taking this medicine. Women should inform their doctor if they wish to become pregnant or think they might be pregnant. There is a potential for serious side effects to an unborn child. Talk to your health care professional or pharmacist for more information. Do not breast-feed an infant while taking this medicine. Men may have a lower sperm count while taking this medicine. Talk to your doctor if you plan to father a child. What side effects may  I notice from receiving this medicine? Side effects that you should report to your doctor or health care professional as soon as possible: -allergic reactions like skin rash, itching or hives, swelling of the face, lips, or tongue -low blood counts - This drug may decrease the number of white blood cells, red blood cells and platelets. You may be at increased risk for infections and bleeding. -signs of infection - fever or chills, cough, sore throat, pain or difficulty passing urine -signs of decreased platelets or bleeding - bruising, pinpoint red spots on the skin, black, tarry stools, nosebleeds -signs of decreased red blood cells - unusually weak or tired, fainting spells, lightheadedness -breathing problems -changes in hearing -change in the amount of urine -chest pain -high blood pressure -mouth sores -nausea and vomiting -pain, swelling, redness or irritation at the injection site -pain, tingling, numbness in the hands or feet -problems with balance, dizziness -seizures Side effects that usually do not require medical attention (report to your doctor or health care professional if they continue or are bothersome): -constipation -hair loss -jaw pain -loss of appetite -sensitivity to light -stomach pain -tumor pain This list may not describe all possible side effects. Call your doctor for medical advice about side effects. You may report side effects to FDA  at 1-800-FDA-1088. Where should I keep my medicine? This drug is given in a hospital or clinic and will not be stored at home. NOTE: This sheet is a summary. It may not cover all possible information. If you have questions about this medicine, talk to your doctor, pharmacist, or health care provider.  2014, [employer]. (2007-10-25 17:15:59)   Dacarbazine, DTIC injection What is this medicine? DACARBAZINE (da KAR ba zeen) is a chemotherapy drug. This medicine is used to treat skin cancer. It is also used with  other medicines to treat Hodgkin's disease. This medicine may be used for other purposes; ask your health care provider or pharmacist if you have questions. COMMON BRAND NAME(S): DTIC-Dome What should I tell my health care provider before I take this medicine? They need to know if you have any of these conditions: -infection (especially virus infection such as chickenpox, cold sores, or herpes) -kidney disease -liver disease -low blood counts like low platelets, red blood cells, white blood cells -recent radiation therapy -an unusual or allergic reaction to dacarbazine, other chemotherapy agents, other medicines, foods, dyes, or preservatives -pregnant or trying to get pregnant -breast-feeding How should I use this medicine? This drug is given as an injection or infusion into a vein. It is administered in a hospital or clinic by a specially trained health care professional. Talk to your pediatrician regarding the use of this medicine in children. While this drug may be prescribed for selected conditions, precautions do apply. Overdosage: If you think you have taken too much of this medicine contact a poison control center or emergency room at once. NOTE: This medicine is only for you. Do not share this medicine with others. What if I miss a dose? It is important not to miss your dose. Call your doctor or health care professional if you are unable to keep an appointment. What may interact with this medicine? -medicines to increase blood counts like filgrastim, pegfilgrastim, sargramostim -vaccines This list may not describe all possible interactions. Give your health care provider a list of all the medicines, herbs, non-prescription drugs, or dietary supplements you use. Also tell them if you smoke, drink alcohol, or use illegal drugs. Some items may interact with your medicine. What should I watch for while using this medicine? Your condition will be monitored carefully while you are  receiving this medicine. You will need important blood work done while you are taking this medicine. This drug may make you feel generally unwell. This is not uncommon, as chemotherapy can affect healthy cells as well as cancer cells. Report any side effects. Continue your course of treatment even though you feel ill unless your doctor tells you to stop. Call your doctor or health care professional for advice if you get a fever, chills or sore throat, or other symptoms of a cold or flu. Do not treat yourself. This drug decreases your body's ability to fight infections. Try to avoid being around people who are sick. This medicine may increase your risk to bruise or bleed. Call your doctor or health care professional if you notice any unusual bleeding. Be careful brushing and flossing your teeth or using a toothpick because you may get an infection or bleed more easily. If you have any dental work done, tell your dentist you are receiving this medicine. Avoid taking products that contain aspirin, acetaminophen, ibuprofen, naproxen, or ketoprofen unless instructed by your doctor. These medicines may hide a fever. Do not become pregnant while taking this medicine. Women should inform  their doctor if they wish to become pregnant or think they might be pregnant. There is a potential for serious side effects to an unborn child. Talk to your health care professional or pharmacist for more information. Do not breast-feed an infant while taking this medicine. What side effects may I notice from receiving this medicine? Side effects that you should report to your doctor or health care professional as soon as possible: -allergic reactions like skin rash, itching or hives, swelling of the face, lips, or tongue -low blood counts - this medicine may decrease the number of white blood cells, red blood cells and platelets. You may be at increased risk for infections and bleeding. -signs of infection - fever or chills,  cough, sore throat, pain or difficulty passing urine -signs of decreased platelets or bleeding - bruising, pinpoint red spots on the skin, black, tarry stools, blood in the urine -signs of decreased red blood cells - unusually weak or tired, fainting spells, lightheadedness -breathing problems -muscle pains -pain at site where injected -trouble passing urine or change in the amount of urine -vomiting -yellowing of the eyes or skin Side effects that usually do not require medical attention (report to your doctor or health care professional if they continue or are bothersome): -diarrhea -hair loss -loss of appetite -nausea -skin more sensitive to sun or ultraviolet light -stomach upset This list may not describe all possible side effects. Call your doctor for medical advice about side effects. You may report side effects to FDA at 1-800-FDA-1088. Where should I keep my medicine? This drug is given in a hospital or clinic and will not be stored at home. NOTE: This sheet is a summary. It may not cover all possible information. If you have questions about this medicine, talk to your doctor, pharmacist, or health care provider.  2014, [employer]. (2007-05-18 16:56:39)

## 2013-06-08 NOTE — Progress Notes (Signed)
Ok to treat with platelets 89 per Dr. Juliann Mule.

## 2013-06-08 NOTE — Progress Notes (Signed)
[place], MD [address] [place] [address]  DIAGNOSIS: Hodgkin's lymphoma - Plan: CBC with Differential, Comprehensive metabolic panel (Cmet) - [facility], Lactate dehydrogenase (LDH) - [facility], Uric acid - [facility], CBC with Differential, Basic metabolic panel (Bmet) - [facility], Lactate dehydrogenase (LDH) - [facility]  Chief Complaint  Patient presents with  . Follow-up   CURRENT THERAPY:  Planning AVD (without Bleomycin due to moderate restriction per PFTs) starting today.  We will repeat PFTs following 2 cycles of AVD to consider addition of Bleomycin.   INTERVAL HISTORY: Diamond Boyd 73 y.o. 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  She was last seen by Dr. Betsy Coder on 04/03/16/2013.  She is accompanied by her husband Diamond Boyd.     Pt states she has had pink eye in her (R) eye and was on antibiotic eye drops since wed 04/27/2013 without resolution.  Now she is on tobradex eye drops with plans for a scheduled biopsy of bottom eyelid on 04/14. She was scheduled for ABVD but it was held pending further evaluation of eye infection.  She reports completing clarithromycin and continued use of her eyedrops.  She followed with her opthalmology yesterday.  She reported going to emergency room on 04/28 due to persistent fevers and had CT of chest demonstrating worsening pulmonary nodules.   She was discharged with treatment for a UTI. Patient presented to the emergency room on 04/23/13 with complaints of shortness of breath and fever and CTA of chest was as noted below.  She expresses having night sweats and mild fevers which started 2 weeks ago.  She denies specifically any pain, difficulty eating, trouble with her bowels, blood in the stool.      MEDICAL HISTORY: Past Medical History  Diagnosis Date  . Colon polyps   . Depression   . Hypothyroidism   . Osteoarthritis     hands  . Peripheral vascular disease   . Degenerative disk disease     spine in center in past   . 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 Cancer     skin cancer- basal cell on arm / colon 2011  . Lymphoma   . Colon cancer     INTERIM HISTORY: has MALIGNANT NEOPLASM OF TRANSVERSE COLON; LYMPHOMA NEC, MLIG, SPLEEN; HYPERCHOLESTEROLEMIA, PURE; DISORDERS OF PHOSPHORUS METABOLISM; Anxiety state, unspecified; DEPRESSION; PERIPHERAL VASCULAR DISEASE; Other chronic sinusitis; ALLERGIC  RHINITIS; GERD; OVERACTIVE BLADDER; MENOPAUSAL SYNDROME; OSTEOARTHRITIS; LEG EDEMA; THROAT PAIN, CHRONIC; SKIN CANCER, HX OF; COLONIC POLYPS, HX OF; Elevated blood pressure; Obesity; Hypothyroid; Varicose veins; Other asplenic status; History of anemia; Screening for lipoid disorders; Other screening mammogram; Routine gynecological examination; Colon cancer screening; Skin lesion of face; Thoracic back pain; Left knee pain; Encounter for Medicare annual wellness exam; Cystocele; Herpes zoster; Vaginal pain; Other malaise and fatigue; Acute sinusitis; Left temporal headache; Hyperglycemia; Conjunctivitis, acute; Cold sore; Hodgkin lymphoma; and Dysuria on her problem list.    ALLERGIES:  is allergic to achromycin; allopurinol; astelin; cephalexin; codeine; meloxicam; minocycline; nabumetone; nyquil; penicillins; sulfa antibiotics; zolpidem tartrate; buspar; and ciprofloxacin.  MEDICATIONS: has a current medication list which includes the following prescription(s): calcium carbonate-vitamin d, celecoxib, cholecalciferol, clarithromycin, dexamethasone, epipen 2-pak, estradiol, fexofenadine, fluoxetine, fluticasone, levothyroxine, lorazepam, nitrofurantoin (macrocrystal-monohydrate), omega-3, omeprazole, ondansetron, prochlorperazine,  psyllium, simvastatin, tobramycin-dexamethasone, vitamin e, and  acyclovir, and the following Facility-Administered Medications: sodium chloride, dacarbazine (DTIC) 800 mg in sodium chloride 0.9 % 250 mL chemo infusion, heparin lock flush, and sodium chloride.  SURGICAL HISTORY:  Past Surgical History  Procedure Laterality Date  . Cholecystectomy    . Splenectomy      lymphoma  . Breast biopsy  1996  . Plantar fascia release    . Ventral hernia repair  1998  . Tendon release      Right thumb   . Colectomy  8/11  . Vaginal hysterectomy    . Bone marrow biopsy  05/27/13   PROBLEM LIST:  1. Adenocarcinoma of the colon with 2 synchronous primaries dating back to August 2011 when the patient was found to have anemia. Both tumors were associated with negative lymph nodes. One tumor was T2 N0. The other tumor was T3 N0. The T2 N0 stage I tumor was in the sigmoid colon. The T3 N0 stage II tumor was in the transverse colon. The smaller tumor measured 1.5 cm. The larger tumor measured 6.5 cm. Surgery was carried out on 09/28/2009. 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  2. History of marginal zone lymphoma probably dating back to 56. The patient had bone marrow involvement and splenomegaly at that time, also a hemolytic anemia. She underwent a splenectomy on  November 12, 1994. Through the years, the patient has received Rituxan most recently, dating back to May 2006. The patient received fludarabine in late 2002 and early 2003. She may have developed a pneumonitis from the fludarabine in December 2002. The patient has had involvement of the peripheral blood with villous lymphocytes. She appears to be in a state of complete clinical remission at this time. Tumor is CD5 and CD20 positive, also CD11c and FMC7 positive.  3. 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  4. History of lupus anticoagulant dating back to the 1990s.  5. History of iron deficiency anemia for which the patient received IV Feraheme in  January 2012. Presumably, this occurred as a result of her colon cancer.  6. History of GERD resulting in cough diagnosed around 2010.  7. Dyslipidemia.  8. Hypothyroidism.  9. Irritable bowel syndrome.  10. Goiter seen on PET scan from 09/04/2009.  11. Enlarged left tonsil diagnosed April 2011, currently resolved.  12. History of depression.   REVIEW OF SYSTEMS:   Constitutional: Denies fevers, chills or abnormal weight loss Eyes: Denies blurriness of vision Ears, nose, mouth, throat, and face: Denies mucositis or sore throat Respiratory: Denies cough, dyspnea or wheezes Cardiovascular: Denies palpitation, chest discomfort or lower extremity swelling Gastrointestinal:  Denies nausea, heartburn or change in bowel habits Skin: Denies abnormal skin rashes Lymphatics: Denies new lymphadenopathy or easy bruising Neurological:Denies numbness, tingling or new weaknesses Behavioral/Psych: Mood is stable, no new changes  All other systems were reviewed with the patient and are negative.  PHYSICAL EXAMINATION: ECOG PERFORMANCE STATUS: 1 - Symptomatic but completely ambulatory  Blood pressure 117/54, pulse 82, temperature 98.8 F (37.1 C), temperature source Oral, resp. rate 26, height 5' 6"  (1.676 m), weight 223 lb 3.2 oz (101.243 kg), last menstrual period 02/10/1970, SpO2 97.00%.  GENERAL:alert, mildly distressed but comfortable; moderately obese. Non-toxic appearing SKIN: skin color, texture, turgor are normal, no rashes or significant lesions EYES: normal, Conjunctiva are pink, sclera clear; R eye slightly injected in the medial eyelid portion.  OROPHARYNX:no exudate, no erythema  and lips, buccal mucosa, and tongue normal  NECK: supple, thyroid normal size, non-tender, without nodularity.  Left neck adenopathy.   LYMPH:  no palpable lymphadenopathy in the cervical, axillary or supraclavicular LUNGS: clear to auscultation and percussion with normal breathing effort HEART: regular rate &  rhythm and no murmurs and no lower extremity edema ABDOMEN:abdomen soft, non-tender and normal bowel sounds; multiple surgical scars well healed.   Musculoskeletal:no cyanosis of digits and no clubbing  NEURO: alert & oriented x 3 with fluent speech, no focal motor/sensory deficits  Labs:  Lab Results  Component Value Date   WBC 7.5 06/08/2013   HGB 9.1* 06/08/2013   HCT 27.1* 06/08/2013   MCV 98.2 06/08/2013   PLT 89* 06/08/2013   NEUTROABS 4.1 06/08/2013      Chemistry      Component Value Date/Time   NA 133* 06/08/2013 1346   NA 131* 06/07/2013 2300   K 4.0 06/08/2013 1346   K 4.1 06/07/2013 2300   CL 92* 06/07/2013 2300   CL 105 07/14/2012 0939   CO2 19* 06/08/2013 1346   CO2 21 06/07/2013 2300   BUN 17.7 06/08/2013 1346   BUN 17 06/07/2013 2300   CREATININE 0.8 06/08/2013 1346   CREATININE 0.74 06/07/2013 2300      Component Value Date/Time   CALCIUM 9.3 06/08/2013 1346   CALCIUM 9.3 06/07/2013 2300   CALCIUM 9.3 08/09/2009 0000   ALKPHOS 528* 06/08/2013 1346   ALKPHOS 529* 06/07/2013 2358   AST 129* 06/08/2013 1346   AST 121* 06/07/2013 2358   ALT 45 06/08/2013 1346   ALT 46* 06/07/2013 2358   BILITOT 2.85* 06/08/2013 1346   BILITOT 2.5* 06/07/2013 2358     IMAGING STUDIES:  1. Digital screening mammogram from 08/05/2010 was negative.  2. Chest x-Diamond Boyd, 2 view, from 12/09/2010 was negative.  3. Digital screening mammogram on 08/20/2011 showed no specific evidence of malignancy  4. Chest x-Diamond Boyd, 2 view, from 11/19/2011 showed atherosclerotic calcifications within the arch of the aorta. There was evidence of a prior cholecystectomy and splenectomy, otherwise chest x-Diamond Boyd was negative.  5. CT scan of abdomen and pelvis with IV contrast obtained on 01/21/2012 showed no clear evidence of colon cancer metastasis. A retrocrural lymph node had increased in size but periportal adenopathy and periaortic adenopathy was stable to decreased. The retrocrural lymph node refer to measures 8 mm on image 12,  increased from 5 mm on a prior study.  6. Chest x-Diamond Boyd, 2 view, from 06/01/2012 showed no acute abnormalities. There was borderline enlargement of the cardiac silhouette.  7. Chest x-Diamond Boyd, 2 view, from 03/09/2013, showed new pulmonary nodules in the right lung. Cannot rule out  metastatic disease. Correlation with CT recommended. (reviewed by me) 8. CT scan of abdomen and pelvis with IV contrast on 03/09/2013 showed small right lower lobe pulmonary nodules measuring up to 8 mm, new, suspicious for metastases. Small right juxtadiaphragmatic, upper abdominal, retroperitoneal, and left pelvic lymph nodes, stable versus mildly increased. Prior splenectomy, cholecystectomy, and right hemicolectomy. PET-CT is suggested for further evaluation.  (reviewed by me) 9. PET/CT on 03/21/2013 revealed intensely hypermetabolic nodule within the left lobe of thyroid gland. Concern for thyroid carcinoma. 2. Two intensely hypermetabolic right lower lobe pulmonary nodules. Differential includes thyroid cancer metastasis or colon cancer metastasis. 3. Intense uptake within the right sacrum is concerning metastasis. Milder abnormal uptake within the T9 vertebral body is indeterminate but concerning for metastasis.  PROCEDURES:  Colonoscopy was carried out by Dr. Herbie Baltimore  Kaplan on 10/10/2010.  IMAGING: 06/08/2013  CT CHEST WITH CONTRAST TECHNIQUE: Multidetector CT imaging of the chest was performed during intravenous contrast administration. CONTRAST: 2m OMNIPAQUE IOHEXOL 300 MG/ML SOLN COMPARISON: DG CHEST 2 VIEW dated 06/07/2013; CT ANGIO CHEST W/CM  &/OR WO/CM dated 04/23/2013 FINDINGS: At lung window settings there are multiple lobulated soft tissue density pulmonary parenchymal masses. The largest lies in the right lower lobe posteriorly and measures 2.1 cm in diameter. This has increased from 1.1 cm on the CT scan of April 23, 2013. There is no alveolar infiltrate. There is no pleural effusion. The cardiac chambers are normal in  size. There are coronary artery calcifications. The caliber of the thoracic aorta is normal. There are no bulky mediastinal or hilar or axillary lymph nodes. There is no pleural nor pericardial effusion. There is a right sided epicardial lymph node measuring 8 mm in short axis. The caliber of the thoracic aorta is normal. A Port-A-Cath appliance is in place with the tip of the catheter lying in the mid SVC. The left thyroid  lobe is enlarged where visualized. There are degenerative disc changes of the thoracic spine but no  evidence of a compression fracture. The sternum and observed portions of the ribs appear normal.  Within the upper abdomen the observed portions of the liver exhibit no acute abnormalities.  IMPRESSION: 1. There are bilateral pulmonary parenchymal masses which have increased in size since the CT scan of April 23, 2013. There is no alveolar infiltrate. 2. There is no pleural nor pericardial effusion. 3. No bulky mediastinal or hilar lymph nodes are demonstrated.  03/21/2013 PET CT NUCLEAR MEDICINE PET SKULL BASE TO THIGH FASTING BLOOD GLUCOSE: Value: 96 mg/dl  TECHNIQUE: 12.1 mCi F-18 FDG was injected intravenously. CT data was obtained and used for attenuation correction and anatomic localization only. (This was not acquired as a diagnostic CT examination.) Additional exam technical data entered on technologist worksheet. COMPARISON: NM PET IMAGE INITIAL (PI) SKULL BASE TO THIGH dated 09/04/2009; CT ABD/PELVIS W CM dated 03/09/2013 FINDINGS: NECK There is intense hypermetabolic focus within the medial aspect of  the left lobe of thyroid gland recent with SUV max 50. This correlates to low-density lesion measuring approximately 18 mm (image 45. CHEST There are two hypermetabolic nodules in the right lower lobe. The more superior nodule measures 18 mm (image 67) with SUV max 13.3. The more inferior nodule measures 8 mm (image 76) with SUV max 2.7. No hypermetabolic mediastinal lymph nodes.  ABDOMEN/PELVIS No abnormal hypermetabolic activity within the liver, pancreas, adrenal glands, or spleen. No hypermetabolic lymph nodes in the abdomen or pelvis. SKELETON There is a lesion in the T9 vertebral body with SUV max 2.7 with no clear lesion on the CT portion. There is intense uptake within the right and central sacrum (image 146) with SUV max 10.4. This is also difficult to see on the CT portion. No focal hypermetabolic activity to suggest skeletal metastasis. IMPRESSION: 1. Intensely hypermetabolic nodule within the left lobe of thyroid gland. Concern for thyroid carcinoma. 2. Two intensely hypermetabolic right lower lobe pulmonary nodules. Differential includes thyroid cancer metastasis or colon cancer metastasis. 3. Intense uptake within the right sacrum is concerning metastasis. Milder abnormal uptake within the T9 vertebral body is indeterminate but concerning for metastasis.  PATHOLOGY:  Lung, needle/core biopsy(ies), RLL sup seg nodule pet+ - MALIGNANT LYMPHORETICULAR NEOPLASM, MOST CONSISTENT WITH CLASSICAL HODGKIN LYMPHOMA. - SEE COMMENT. Diagnosis Note The core biopsies reveal a diffuse lymphohistiocytic infiltrate. There are scattered  Hodgkin Reed-Sternberg like cells with prominent nuclei. There are rare lacunar like cells. There are no significant eosinophils or plasma cells seen in the background. Immunohistochemistry reveals scattered CD30 positive cells with golgi staining. The cells are also positive for CD15 and PAX-5. CD45 interpretation is hampered by diffuse background T-cells. CD3 and CD43 highlight abundant small T-cells. Only scattered B-cells are seen with CD79a and CD20. EBV in situ (EBER) is negative. CDX-2, cytokeratin 7 and cytokeratin 20 are negative for carcinoma. Overall, these findings are consistent with extranodal pulmonary involvement by classical Hodgkin lymphoma. The case was sent to [facility] for consultation (report [phone]), who  agrees with the above interpretation. Dr. Juliann Mule was paged on 05/19/2013.  05/26/2013  Bone Marrow, Aspirate,Biopsy, and Clot, right iliac - HYPERCELLULAR BONE MARROW WITH ATYPICAL LYMPHOHISTIOCYTIC PROLIFERATION. - TRILINEAGE HEMATOPOIESIS.- SEE COMMENT.PERIPHERAL BLOOD:- NORMOCYTIC-NORMOCHROMIC ANEMIA. - MONOCYTOSIS.Diagnosis Note The aspirate material is extremely limited for morphologic evaluation. However, the core biopsy shows numerous variably sized and ill-defined interstitial and paratrabecular atypical lymphohistiocytic aggregates mostly composed of small lymphocytes and histiocytes in addition to scattering of larger CD30 positive lymphoid appearing cells. The histologic and phenotypic features are similar to previously diagnosed classical Hodgkin's lymphoma ([id number]) and consistent with marrow involvement by the same disease process. (BNS:ecj/gt 05/27/2013)BASSAM SMIR MD Pathologist, Electronic Signature (Case signed 05/31/2013)  ASSESSMENT: [name] 73 y.o. female with a history of Hodgkin's lymphoma - Plan: CBC with Differential, Comprehensive metabolic panel (Cmet) - [facility], Lactate dehydrogenase (LDH) - [facility], Uric acid - [facility], CBC with Differential, Basic metabolic panel (Bmet) - [facility], Lactate dehydrogenase (LDH) - [facility] now with Hodgkins lymphoma with B symptoms including (Fevers, night sweats and weight lost).   PLAN:  1. H.o of Marginal zone lymphoma now with multiple lung nodules consistent with Hodgkin's lymphoma with B symptoms.   --Her marginal zone lymphoma continues to be quiescent even though one of the lymph nodes removed at the time of colon cancer surgery in August 2011 was found to have B-cell non-Hodgkin lymphoma. Aften is on no treatment and in fact, has not required any therapy since May 2006 at which time she received Rituxan.   --Her CT of abdomen is as indicated above.  She has had  weight lost. Otherwise, she denies any additional constitutional symptoms such  as fever or chills. Preliminary biopsy from CT guided lung biopsy is negative for colon adenocarcinoma.  It appeared suspicious for lymphoproliferative disorder. The pathology reviewed internally and externally at [facility] is consistent with Hodgkin's lymphoma, classical subtype.    --We had an extensive discussion and review of her pulmonary pathology and final report conclusive for classical hodgkin's disease.  She reports night sweats which would be considered a constitutional B symptoms. Her ESR is 84 (high).  We provided her a detailed handout and a detailed discussion about his new diagnosis of Hodgkin's lymphoma. We addressed several of his questions. The handout covers basics on lymphoma and its staging and possible treatment options.   Based on advanced lymphoma, she received a bone marrow biopsy which demonstrated involvement of Hodgkin's lymphoma.  Her echo also was obtained demonstrating a normal EF.  She received a R port-a-cath.   Her PFTs as noted above demonstrated a moderate restriction (likely secondary to presence of the disease).   --We will perform chemotherapy with AVD x 6 cycles (category 2A) q28 days (doxorubicin, bleomycin, vinblastine, and dacarbazine) (Duggan DB, JCO, 2003).  Based on moderate restriction component of her PFTs, we will hold  bleomycin.     --We again had a detailed discussion regarding the benefits of including approximately 80 percent of patients with advanced stage HL will attain a complete response after treatment with ABVD. Up to one-quarter of patients will have disease progression requiring further therapy; half of those will have long term survival with autologous hematopoietic cell transplantation. Overall survival rates at 4, 7, and 10 years are approximately 90, 75, and 55 percent, respectively.   --The risks of AVD was provided as a handout and the patient will attend a chemotherapy teaching. These risks include but are not limited to bone marrow  suppression which can result in life-threatening infections, hair loss, GI disturbances, fatigue, nausea/vomiting. He understood the benefits and risk and chose to proceed. We will give Day #1 chemotherapy as follows:   Day 1, Cycle 1A 06/08/2013  Doxorubicin (Adriamycin) 12.5 mg/m2 (decreased by 50% based on elevated liver function) Vinblastine 3 mg/m2 (decreased by 50% based elevated liver function) Dacarbazine 375 mg/m2   -She will have Day1, Cycle 1B on 05/13. We will check weekly cbc to determine count recovery. We will obtain an restaging PET-CT following Cycle #2 (NCCN Guidelines, Version 2.2014, HODG-9) to assess response and further treatment based on [place] criteria for response.   --She has a good performance status and night sweats, fevers and weight lost are concerning for a B-symptom. She has an elevated ESR and her bone marrow demonstrates involvement. Her CBC demonstrates mild anemia with thrombocytopenia. Her LDH is elevated. Her creatinine is normal. Her calcium and serum potassium is normal. She has small to medium tumor burden but low risk for tumor lysis syndrome Nadara Mustard [place], et al, the Tumor Lysis Syndrome. NEJM, 2011; 505:3976) and will therefore be hydrated aggressively with close monitoring.   --She was given anti-emetics and in addition we will start acyclovir 500 mg bid prophylaxis.   She has a listed allergy to allopurinol.   2. Colon cancer now with multiple lung nodules in RLL.   --Drusilla remains clinically stable. Her last colonoscopy was on  10/10/2010 by Dr. Deatra Ina.  We will make Dr. Deatra Ina of aware. CEA is stable at 1.9.     3. Thyroid nodule.    --Her PET/CT was also concerning for thyroid carcinoma. TSH on 01/04/2013 was 0.51.  Biopsy as reported above.    4. Right eye irritation -- She is being followed by dermatology.  She reports that she will require a biopsy of there right bottom eyelid to rule out malignancy. Pt states she has had pink eye in her (R) eye  and was on antibiotic eye drops since wed 04/27/2013 without resolution.  Now she is on tobradex eye drops.   5. Hyperbilirubinemia with elevated AST.  --We will obtain an abdominal ultrasound to rule out biliary obstruction.    6. Follow-up.  --We will plan to see Trudi again in 1 week for a brief symptom visit.   All questions were answered. The patient knows to call the clinic with any problems, questions or concerns. We can certainly see the patient much sooner if necessary.  I spent 25 minutes counseling the patient face to face. The total time spent in the appointment was 40 minutes.    Concha Norway, MD 06/08/2013 5:18 PM

## 2013-06-08 NOTE — Patient Instructions (Addendum)
Adult Hodgkin Lymphoma  Adult Hodgkin lymphoma is a cancer of the lymph system. Lymph tissue is found throughout the body. Hodgkin lymphoma can begin in almost any part of the body. It can spread to almost any tissue or organ in the body. Being young, female and having had the virus that causes mononucleosis are all things that make you more likely to get Adult Hodgkin Lymphoma. There are two main types of Hodgkin lymphoma:   Classical.  Nodular lymphocyte-predominant. Adult Hodgkin lymphoma is a type of cancer that develops in the lymph system. This is part of the body's immune system. The lymph system is made up of the following:  Lymph: Colorless, watery fluid that travels through the lymph system and carries white blood cells called lymphocytes. Lymphocytes protect the body against infections and the growth of tumors.  Lymph vessels: A network of thin tubes that collect lymph from different parts of the body and return it to the bloodstream.  Lymph nodes: Small, bean-shaped structures that filter lymph and store white blood cells that help fight infection and disease. When you have an infection, this is what is often called "swollen glands". Lymph nodes are located along the network of lymph vessels found throughout the body. Clusters of lymph nodes are found in the:  Underarm.  Pelvis.  Neck.  Abdomen.  Groin.  Spleen: Located on the left side of the abdomen near the stomach. This organ:  Makes lymphocytes.  Filters the blood.  Stores blood cells.  Destroys old blood cells.  Thymus: An organ in which lymphocytes grow and multiply. The thymus is in the chest behind the breastbone.  Tonsils: Two small masses of lymph tissue at the back of the throat. The tonsils produce lymphocytes.  Bone marrow: The soft, spongy tissue in the center of large bones. Bone marrow produces white blood cells including:  Lymphocytes.  Red blood cells.  Platelets. CAUSES  Risk factors for  adult Hodgkin lymphoma include the following:  Being in young or late adulthood.  Being female.  Being infected with the Epstein-Barr virus.  Having a first-degree relative (parent, brother, or sister) with Hodgkin lymphoma. SYMPTOMS  Other conditions may cause the same symptoms. A caregiver should be consulted if any of the following problems do not go away:  Painless, swollen lymph nodes in the neck, underarm, or groin.  Fever for no known reason.  Drenching night sweats.  Weight loss for no known reason.  Itchy skin.  Feeling very tired. DIAGNOSIS  Tests that examine lymph nodes are used to find and diagnose adult Hodgkin lymphoma. The following tests and procedures may be used:  Physical exam and history: A history of the your past illnesses and treatments will be taken. An exam of the body will check general signs of health. This includes looking for signs of disease. This may be paying special attention to lumps, swollen glands or anything else that seems unusual.  A complete blood count (CBC) is done. The CBC is used to test for, diagnose, and monitor many different conditions.  Your blood is checked for the following:  The number of red blood cells, white blood cells, and platelets.  The amount of hemoglobin (the protein that carries oxygen) in the red blood cells.  The portion of the sample made up of red blood cells.  Sedimentation rate: A procedure in which a sample of blood is drawn and checked for the rate at which the red blood cells settle to the bottom of the test  tube.  Blood chemistry studies: A procedure in which a blood sample is checked to measure the amounts of certain substances in the blood. An unusual (higher or lower than normal) amount of a substance can be a sign of disease in the organ or tissue that makes it.  Chest x-ray: An x-ray of the organs and bones inside the chest. An x-ray is a type of energy beam that can go through the body and onto  film, making a picture of areas inside the body.  CT scan (CAT scan): These are specialized x-rays. A dye may be injected into a vein or swallowed to help the organs or tissues show up better. For adult Hodgkin lymphoma, CT scans of the chest, abdomen, and pelvis are taken. A CT scan may also be known as:  Computed tomography.  Computerized tomography.  Computerized axial tomography.  PET scan (positron emission tomography scan): A procedure to find malignant tumor cells in the body. A small amount of radioactive glucose (sugar) is injected into a your vein. The PET scanner rotates around the body and makes a picture of where glucose is being used in the body. Malignant tumor cells show up brighter in the picture. These tumor cells are more active and take up more glucose than normal cells do.  Laparotomy: A surgical procedure in which an incision (cut) is made in the wall of the abdomen to check the inside of the abdomen for signs of disease. The size of the incision depends on the reason the laparotomy is being done. Sometimes organs are removed or tissue samples are taken and checked under a microscope for signs of disease. This procedure is done only if it is needed to make decisions about treatment.  Thoracentesis: The removal of fluid from the space between the lining of the chest and the lung. The fluid is removed using a needle. A pathologist views the fluid under a microscope to look for cancer cells.  For pregnant women with Hodgkin lymphoma, staging tests protect the fetus from harmful radiation are used. These include:  MRI (magnetic resonance imaging): A procedure that uses a magnet, radio waves, and a computer to make a series of detailed pictures of areas inside the body. This procedure is also called nuclear magnetic resonance imaging (NMRI).  Ultrasound exam: A procedure in which high-energy sound waves (ultrasound) are bounced off internal tissues or organs and make echoes. The  echoes form a picture of body tissues called a sonogram.  Often a small piece of tissue is removed. This is called a biopsy. A pathologist (specialist in looking at tissues) will examine the tissue. The specialist then looks for cancer cells, especially Reed-Sternberg cells. Reed-Sternberg cells are common in classical Hodgkin lymphoma. Below are different types of biopsies performed:  Lymph node biopsy: The removal of all or part of a lymph node.  Excisional biopsy: The removal of an entire lymph node.  Incisional biopsy: The removal of part of a lymph node.  Core biopsy: The removal of part of a lymph node using a wide needle.  Bone marrow aspiration and biopsy: The removal of bone marrow, blood, and a small piece of bone by inserting a hollow needle into the hipbone or breastbone.  Immunophenotyping: A test in which the cells in a sample of blood or bone marrow are looked at under a microscope to find out if malignant lymphocytes (cancer) began from the B lymphocytes or the T lymphocytes. STAGING After adult Hodgkin lymphoma has been diagnosed, tests  are done to find out if cancer cells have spread within the lymph system or to other parts of the body. The process used to find out if cancer has spread within the lymph system or to other parts of the body is called staging. The information gathered from the staging process determines the stage of the disease. It is important to know the stage in order to plan treatment. Adult Hodgkin lymphoma may be described as follows:  A: The patient has no symptoms.  B: The patient has symptoms such as fever, weight loss, or night sweats.  E: "E" stands for extranodal and means the cancer is found in an area or organ other than the lymph nodes or has spread to tissues beyond, but near, the major lymphatic areas.  S: "S" stands for spleen and means the cancer is found in the spleen. The following stages are used for adult Hodgkin lymphoma:   Stage I -  Cancer is found in one lymph node group.  Stage IE: Cancer is found in an area or organ other than the lymph nodes.  Stage II - Cancer is found in two or more lymph node groups on the same side of the diaphragm (the thin muscle below the lungs that helps breathing and separates the chest from the abdomen).  Stage IIE: Cancer is found in an area or organ other than the lymph nodes and in lymph nodes near that area or organ. Cancer may have spread to other lymph node groups on the same side of the diaphragm.  Stage III - Cancer is found in lymph node groups on both sides of the diaphragm.  Stage IIIE: Cancer is found in lymph node groups on both sides of the diaphragm and in an area or organ other than the lymph nodes.  Stage IIIS: Cancer is found in lymph node groups on both sides of the diaphragm and in the spleen.  Stage IIIS+E: Cancer is found in lymph node groups on both sides of the diaphragm, in an area or organ other than the lymph nodes, and in the spleen.  Stage III(1): Cancer is found only in the upper abdomen above the renal vein.  Stage III(2): Cancer is found in lymph nodes in the pelvis and/or near the aorta.  Stage IV - The cancer either may be found throughout one or more organs other than the lymph nodes and may be in lymph nodes near those organs. The cancer may also be found in one organ other than the lymph nodes and has spread to lymph nodes far away from that organ. In stage IV, Adult Hodgkin lymphoma may be grouped for treatment as follows:  Early Favorable.  Early Unfavorable.  Advanced Favorable.  Advanced Unfavorable. TREATMENT  There are different types of treatment for patients with adult Hodgkin lymphoma. The treatment is generally planned by a team of caregivers with expertise in treating lymphomas. Treatment varies with the stage of your disease. Treatment for adults may be different than treatment for children. Hodgkin lymphoma may also occur in patients  who have acquired immunodeficiency syndrome (AIDS); these patients require special treatment. Choosing the most appropriate cancer treatment is a decision that ideally involves the patient, family, and health care team. Three types of standard treatment are used:   Chemotherapy. This is treatment with medications which kill cancer.  Radiation therapy. This is high dose x-ray treatment of the tumor.  Surgery. This is done to remove areas high in tumor such as the spleen. New  types of treatment are being tested in clinical trials. These include the following:   High-dose chemotherapy.  Radiation therapy with stem cell transplant. HODGKIN LYMPHOMA DURING PREGNANCY When Hodgkin lymphoma is diagnosed in the second half of pregnancy, most patients can delay treatment until after the baby is born. Treatment of Hodgkin lymphoma during the second half of pregnancy may include the following:   Watchful waiting, with plans to induce delivery when the fetus is 82 to 1 weeks old.  Systemic chemotherapy using one or more drugs.  Steroid therapy.  Radiation therapy to relieve breathing problems caused by a large tumor in the chest.  Recurrent Adult Hodgkin Lymphoma. Treatment of recurrent Hodgkin lymphoma may include the following:  Combination chemotherapy.  Combination chemotherapy followed by high-dose chemotherapy and stem cell transplant with or without radiation therapy.  Radiation therapy with or without chemotherapy.  Chemotherapy as palliative therapy to relieve symptoms and improve quality of life.  A clinical trial of high-dose chemotherapy and stem cell transplant.  This summary section refers to specific treatments under study in clinical trials, but it may not mention every new treatment being studied. Information about ongoing clinical trials is available from the [employer] site. For Hodgkin lymphoma during pregnancy, treatment options also depend on:  The wishes of the  patient.  The age of the fetus. Adult Hodgkin lymphoma can often be cured if found and treated early. [place]: www.cancer.gov Document Released: 05/06/2007 Document Revised: 04/21/2011 Document Reviewed: 05/06/2007 [facility] Patient Information 2014 [place], [place].   Dacarbazine, DTIC injection What is this medicine? DACARBAZINE (da KAR ba zeen) is a chemotherapy drug. This medicine is used to treat skin cancer. It is also used with other medicines to treat Hodgkin's disease. This medicine may be used for other purposes; ask your health care provider or pharmacist if you have questions. COMMON BRAND NAME(S): DTIC-Dome What should I tell my health care provider before I take this medicine? They need to know if you have any of these conditions: -infection (especially virus infection such as chickenpox, cold sores, or herpes) -kidney disease -liver disease -low blood counts like low platelets, red blood cells, white blood cells -recent radiation therapy -an unusual or allergic reaction to dacarbazine, other chemotherapy agents, other medicines, foods, dyes, or preservatives -pregnant or trying to get pregnant -breast-feeding How should I use this medicine? This drug is given as an injection or infusion into a vein. It is administered in a hospital or clinic by a specially trained health care professional. Talk to your pediatrician regarding the use of this medicine in children. While this drug may be prescribed for selected conditions, precautions do apply. Overdosage: If you think you have taken too much of this medicine contact a poison control center or emergency room at once. NOTE: This medicine is only for you. Do not share this medicine with others. What if I miss a dose? It is important not to miss your dose. Call your doctor or health care professional if you are unable to keep an appointment. What may interact with this  medicine? -medicines to increase blood counts like filgrastim, pegfilgrastim, sargramostim -vaccines This list may not describe all possible interactions. Give your health care provider a list of all the medicines, herbs, non-prescription drugs, or dietary supplements you use. Also tell them if you smoke, drink alcohol, or use illegal drugs. Some items may interact with your medicine. What should I watch for while using this medicine? Your condition will be monitored  carefully while you are receiving this medicine. You will need important blood work done while you are taking this medicine. This drug may make you feel generally unwell. This is not uncommon, as chemotherapy can affect healthy cells as well as cancer cells. Report any side effects. Continue your course of treatment even though you feel ill unless your doctor tells you to stop. Call your doctor or health care professional for advice if you get a fever, chills or sore throat, or other symptoms of a cold or flu. Do not treat yourself. This drug decreases your body's ability to fight infections. Try to avoid being around people who are sick. This medicine may increase your risk to bruise or bleed. Call your doctor or health care professional if you notice any unusual bleeding. Be careful brushing and flossing your teeth or using a toothpick because you may get an infection or bleed more easily. If you have any dental work done, tell your dentist you are receiving this medicine. Avoid taking products that contain aspirin, acetaminophen, ibuprofen, naproxen, or ketoprofen unless instructed by your doctor. These medicines may hide a fever. Do not become pregnant while taking this medicine. Women should inform their doctor if they wish to become pregnant or think they might be pregnant. There is a potential for serious side effects to an unborn child. Talk to your health care professional or pharmacist for more information. Do not breast-feed an  infant while taking this medicine. What side effects may I notice from receiving this medicine? Side effects that you should report to your doctor or health care professional as soon as possible: -allergic reactions like skin rash, itching or hives, swelling of the face, lips, or tongue -low blood counts - this medicine may decrease the number of white blood cells, red blood cells and platelets. You may be at increased risk for infections and bleeding. -signs of infection - fever or chills, cough, sore throat, pain or difficulty passing urine -signs of decreased platelets or bleeding - bruising, pinpoint red spots on the skin, black, tarry stools, blood in the urine -signs of decreased red blood cells - unusually weak or tired, fainting spells, lightheadedness -breathing problems -muscle pains -pain at site where injected -trouble passing urine or change in the amount of urine -vomiting -yellowing of the eyes or skin Side effects that usually do not require medical attention (report to your doctor or health care professional if they continue or are bothersome): -diarrhea -hair loss -loss of appetite -nausea -skin more sensitive to sun or ultraviolet light -stomach upset This list may not describe all possible side effects. Call your doctor for medical advice about side effects. You may report side effects to FDA at 1-800-FDA-1088. Where should I keep my medicine? This drug is given in a hospital or clinic and will not be stored at home. NOTE: This sheet is a summary. It may not cover all possible information. If you have questions about this medicine, talk to your doctor, pharmacist, or health care provider.  2014, [employer]. (2007-05-18 16:56:39)     Doxorubicin injection What is this medicine? DOXORUBICIN (dox oh ROO bi sin) is a chemotherapy drug. It is used to treat many kinds of cancer like Hodgkin's disease, leukemia, non-Hodgkin's lymphoma, neuroblastoma, sarcoma,  and Wilms' tumor. It is also used to treat bladder cancer, breast cancer, lung cancer, ovarian cancer, stomach cancer, and thyroid cancer. This medicine may be used for other purposes; ask your health care provider or pharmacist if you have  questions. COMMON BRAND NAME(S): Adriamycin PFS, Adriamycin RDF, Adriamycin, Rubex What should I tell my health care provider before I take this medicine? They need to know if you have any of these conditions: -blood disorders -heart disease, recent heart attack -infection (especially a virus infection such as chickenpox, cold sores, or herpes) -irregular heartbeat -liver disease -recent or ongoing radiation therapy -an unusual or allergic reaction to doxorubicin, other chemotherapy agents, other medicines, foods, dyes, or preservatives -pregnant or trying to get pregnant -breast-feeding How should I use this medicine? This drug is given as an infusion into a vein. It is administered in a hospital or clinic by a specially trained health care professional. If you have pain, swelling, burning or any unusual feeling around the site of your injection, tell your health care professional right away. Talk to your pediatrician regarding the use of this medicine in children. Special care may be needed. Overdosage: If you think you have taken too much of this medicine contact a poison control center or emergency room at once. NOTE: This medicine is only for you. Do not share this medicine with others. What if I miss a dose? It is important not to miss your dose. Call your doctor or health care professional if you are unable to keep an appointment. What may interact with this medicine? Do not take this medicine with any of the following medications: -cisapride -droperidol -halofantrine -pimozide -zidovudine This medicine may also interact with the following  medications: -chloroquine -chlorpromazine -clarithromycin -cyclophosphamide -cyclosporine -erythromycin -medicines for depression, anxiety, or psychotic disturbances -medicines for irregular heart beat like amiodarone, bepridil, dofetilide, encainide, flecainide, propafenone, quinidine -medicines for seizures like ethotoin, fosphenytoin, phenytoin -medicines for nausea, vomiting like dolasetron, ondansetron, palonosetron -medicines to increase blood counts like filgrastim, pegfilgrastim, sargramostim -methadone -methotrexate -pentamidine -progesterone -vaccines -verapamil Talk to your doctor or health care professional before taking any of these medicines: -acetaminophen -aspirin -ibuprofen -ketoprofen -naproxen This list may not describe all possible interactions. Give your health care provider a list of all the medicines, herbs, non-prescription drugs, or dietary supplements you use. Also tell them if you smoke, drink alcohol, or use illegal drugs. Some items may interact with your medicine. What should I watch for while using this medicine? Your condition will be monitored carefully while you are receiving this medicine. You will need important blood work done while you are taking this medicine. This drug may make you feel generally unwell. This is not uncommon, as chemotherapy can affect healthy cells as well as cancer cells. Report any side effects. Continue your course of treatment even though you feel ill unless your doctor tells you to stop. Your urine may turn red for a few days after your dose. This is not blood. If your urine is dark or brown, call your doctor. In some cases, you may be given additional medicines to help with side effects. Follow all directions for their use. Call your doctor or health care professional for advice if you get a fever, chills or sore throat, or other symptoms of a cold or flu. Do not treat yourself. This drug decreases your body's ability to  fight infections. Try to avoid being around people who are sick. This medicine may increase your risk to bruise or bleed. Call your doctor or health care professional if you notice any unusual bleeding. Be careful brushing and flossing your teeth or using a toothpick because you may get an infection or bleed more easily. If you have any dental work done, tell your  dentist you are receiving this medicine. Avoid taking products that contain aspirin, acetaminophen, ibuprofen, naproxen, or ketoprofen unless instructed by your doctor. These medicines may hide a fever. Men and women of childbearing age should use effective birth control methods while using taking this medicine. Do not become pregnant while taking this medicine. There is a potential for serious side effects to an unborn child. Talk to your health care professional or pharmacist for more information. Do not breast-feed an infant while taking this medicine. Do not let others touch your urine or other body fluids for 5 days after each treatment with this medicine. Caregivers should wear latex gloves to avoid touching body fluids during this time. There is a maximum amount of this medicine you should receive throughout your life. The amount depends on the medical condition being treated and your overall health. Your doctor will watch how much of this medicine you receive in your lifetime. Tell your doctor if you have taken this medicine before. What side effects may I notice from receiving this medicine? Side effects that you should report to your doctor or health care professional as soon as possible: -allergic reactions like skin rash, itching or hives, swelling of the face, lips, or tongue -low blood counts - this medicine may decrease the number of white blood cells, red blood cells and platelets. You may be at increased risk for infections and bleeding. -signs of infection - fever or chills, cough, sore throat, pain or difficulty passing  urine -signs of decreased platelets or bleeding - bruising, pinpoint red spots on the skin, black, tarry stools, blood in the urine -signs of decreased red blood cells - unusually weak or tired, fainting spells, lightheadedness -breathing problems -chest pain -fast, irregular heartbeat -mouth sores -nausea, vomiting -pain, swelling, redness at site where injected -pain, tingling, numbness in the hands or feet -swelling of ankles, feet, or hands -unusual bleeding or bruising Side effects that usually do not require medical attention (report to your doctor or health care professional if they continue or are bothersome): -diarrhea -facial flushing -hair loss -loss of appetite -missed menstrual periods -nail discoloration or damage -red or watery eyes -red colored urine -stomach upset This list may not describe all possible side effects. Call your doctor for medical advice about side effects. You may report side effects to FDA at 1-800-FDA-1088. Where should I keep my medicine? This drug is given in a hospital or clinic and will not be stored at home. NOTE: This sheet is a summary. It may not cover all possible information. If you have questions about this medicine, talk to your doctor, pharmacist, or health care provider.  2014, Elsevier/Gold Standard. (2012-05-25 09:54:34)   Vinblastine injection What is this medicine? VINBLASTINE (vin BLAS teen) is a chemotherapy drug. It slows the growth of cancer cells. This medicine is used to treat many types of cancer like breast cancer, testicular cancer, Hodgkin's disease, non-Hodgkin's lymphoma, and sarcoma. This medicine may be used for other purposes; ask your health care provider or pharmacist if you have questions. COMMON BRAND NAME(S): Velban What should I tell my health care provider before I take this medicine? They need to know if you have any of these conditions: -blood disorders -dental disease -gout -infection (especially a  virus infection such as chickenpox, cold sores, or herpes) -liver disease -lung disease -nervous system disease -recent or ongoing radiation therapy -an unusual or allergic reaction to vinblastine, other chemotherapy agents, other medicines, foods, dyes, or preservatives -pregnant or trying to get  pregnant -breast-feeding How should I use this medicine? This drug is given as an infusion into a vein. It is administered in a hospital or clinic by a specially trained health care professional. If you have pain, swelling, burning or any unusual feeling around the site of your injection, tell your health care professional right away. Talk to your pediatrician regarding the use of this medicine in children. While this drug may be prescribed for selected conditions, precautions do apply. Overdosage: If you think you have taken too much of this medicine contact a poison control center or emergency room at once. NOTE: This medicine is only for you. Do not share this medicine with others. What if I miss a dose? It is important not to miss your dose. Call your doctor or health care professional if you are unable to keep an appointment. What may interact with this medicine? Do not take this medicine with any of the following medications: -erythromycin -itraconazole -mibefradil -voriconazole This medicine may also interact with the following medications: -cyclosporine -fluconazole -ketoconazole -medicines for seizures like phenytoin -medicines to increase blood counts like filgrastim, pegfilgrastim, sargramostim -vaccines -verapamil Talk to your doctor or health care professional before taking any of these medicines: -acetaminophen -aspirin -ibuprofen -ketoprofen -naproxen This list may not describe all possible interactions. Give your health care provider a list of all the medicines, herbs, non-prescription drugs, or dietary supplements you use. Also tell them if you smoke, drink alcohol, or  use illegal drugs. Some items may interact with your medicine. What should I watch for while using this medicine? Your condition will be monitored carefully while you are receiving this medicine. You will need important blood work done while you are taking this medicine. This drug may make you feel generally unwell. This is not uncommon, as chemotherapy can affect healthy cells as well as cancer cells. Report any side effects. Continue your course of treatment even though you feel ill unless your doctor tells you to stop. In some cases, you may be given additional medicines to help with side effects. Follow all directions for their use. Call your doctor or health care professional for advice if you get a fever, chills or sore throat, or other symptoms of a cold or flu. Do not treat yourself. This drug decreases your body's ability to fight infections. Try to avoid being around people who are sick. This medicine may increase your risk to bruise or bleed. Call your doctor or health care professional if you notice any unusual bleeding. Be careful brushing and flossing your teeth or using a toothpick because you may get an infection or bleed more easily. If you have any dental work done, tell your dentist you are receiving this medicine. Avoid taking products that contain aspirin, acetaminophen, ibuprofen, naproxen, or ketoprofen unless instructed by your doctor. These medicines may hide a fever. Do not become pregnant while taking this medicine. Women should inform their doctor if they wish to become pregnant or think they might be pregnant. There is a potential for serious side effects to an unborn child. Talk to your health care professional or pharmacist for more information. Do not breast-feed an infant while taking this medicine. Men may have a lower sperm count while taking this medicine. Talk to your doctor if you plan to father a child. What side effects may I notice from receiving this  medicine? Side effects that you should report to your doctor or health care professional as soon as possible: -allergic reactions like skin rash, itching  or hives, swelling of the face, lips, or tongue -low blood counts - This drug may decrease the number of white blood cells, red blood cells and platelets. You may be at increased risk for infections and bleeding. -signs of infection - fever or chills, cough, sore throat, pain or difficulty passing urine -signs of decreased platelets or bleeding - bruising, pinpoint red spots on the skin, black, tarry stools, nosebleeds -signs of decreased red blood cells - unusually weak or tired, fainting spells, lightheadedness -breathing problems -changes in hearing -change in the amount of urine -chest pain -high blood pressure -mouth sores -nausea and vomiting -pain, swelling, redness or irritation at the injection site -pain, tingling, numbness in the hands or feet -problems with balance, dizziness -seizures Side effects that usually do not require medical attention (report to your doctor or health care professional if they continue or are bothersome): -constipation -hair loss -jaw pain -loss of appetite -sensitivity to light -stomach pain -tumor pain This list may not describe all possible side effects. Call your doctor for medical advice about side effects. You may report side effects to FDA at 1-800-FDA-1088. Where should I keep my medicine? This drug is given in a hospital or clinic and will not be stored at home. NOTE: This sheet is a summary. It may not cover all possible information. If you have questions about this medicine, talk to your doctor, pharmacist, or health care provider.  2014, Elsevier/Gold Standard. (2007-10-25 17:15:59)

## 2013-06-08 NOTE — Discharge Instructions (Signed)
Followup with your oncologist today as previously scheduled. Return immediately for any worsening symptoms.  Fever, Adult A fever is a higher than normal body temperature. In an adult, an oral temperature around 98.6 F (37 C) is considered normal. A temperature of 100.4 F (38 C) or higher is generally considered a fever. Mild or moderate fevers generally have no long-term effects and often do not require treatment. Extreme fever (greater than or equal to 106 F or 41.1 C) can cause seizures. The sweating that may occur with repeated or prolonged fever may cause dehydration. Elderly people can develop confusion during a fever. A measured temperature can vary with:  Age.  Time of day.  Method of measurement (mouth, underarm, rectal, or ear). The fever is confirmed by taking a temperature with a thermometer. Temperatures can be taken different ways. Some methods are accurate and some are not.  An oral temperature is used most commonly. Electronic thermometers are fast and accurate.  An ear temperature will only be accurate if the thermometer is positioned as recommended by the manufacturer.  A rectal temperature is accurate and done for those adults who have a condition where an oral temperature cannot be taken.  An underarm (axillary) temperature is not accurate and not recommended. Fever is a symptom, not a disease.  CAUSES   Infections commonly cause fever.  Some noninfectious causes for fever include:  Some arthritis conditions.  Some thyroid or adrenal gland conditions.  Some immune system conditions.  Some types of cancer.  A medicine reaction.  High doses of certain street drugs such as methamphetamine.  Dehydration.  Exposure to high outside or room temperatures.  Occasionally, the source of a fever cannot be determined. This is sometimes called a "fever of unknown origin" (FUO).  Some situations may lead to a temporary rise in body temperature that may go away  on its own. Examples are:  Childbirth.  Surgery.  Intense exercise. HOME CARE INSTRUCTIONS   Take appropriate medicines for fever. Follow dosing instructions carefully. If you use acetaminophen to reduce the fever, be careful to avoid taking other medicines that also contain acetaminophen. Do not take aspirin for a fever if you are younger than age 66. There is an association with Reye's syndrome. Reye's syndrome is a rare but potentially deadly disease.  If an infection is present and antibiotics have been prescribed, take them as directed. Finish them even if you start to feel better.  Rest as needed.  Maintain an adequate fluid intake. To prevent dehydration during an illness with prolonged or recurrent fever, you may need to drink extra fluid.Drink enough fluids to keep your urine clear or pale yellow.  Sponging or bathing with room temperature water may help reduce body temperature. Do not use ice water or alcohol sponge baths.  Dress comfortably, but do not over-bundle. SEEK MEDICAL CARE IF:   You are unable to keep fluids down.  You develop vomiting or diarrhea.  You are not feeling at least partly better after 3 days.  You develop new symptoms or problems. SEEK IMMEDIATE MEDICAL CARE IF:   You have shortness of breath or trouble breathing.  You develop excessive weakness.  You are dizzy or you faint.  You are extremely thirsty or you are making little or no urine.  You develop new pain that was not there before (such as in the head, neck, chest, back, or abdomen).  You have persistant vomiting and diarrhea for more than 1 to 2 days.  You  develop a stiff neck or your eyes become sensitive to light.  You develop a skin rash.  You have a fever or persistent symptoms for more than 2 to 3 days.  You have a fever and your symptoms suddenly get worse. MAKE SURE YOU:   Understand these instructions.  Will watch your condition.  Will get help right away if you  are not doing well or get worse. Document Released: 07/23/2000 Document Revised: 04/21/2011 Document Reviewed: 11/28/2010 [facility] Patient Information 2014 [place], [place].

## 2013-06-09 ENCOUNTER — Telehealth: Payer: Self-pay | Admitting: *Deleted

## 2013-06-09 ENCOUNTER — Telehealth: Payer: Self-pay | Admitting: Internal Medicine

## 2013-06-09 LAB — PULMONARY FUNCTION TEST
FEF 25-75 POST: 2.03 L/s
FEF 25-75 PRE: 1.89 L/s
FEF2575-%CHANGE-POST: 7 %
FEF2575-%PRED-PRE: 101 %
FEF2575-%Pred-Post: 108 %
FEV1-%Change-Post: 0 %
FEV1-%PRED-PRE: 78 %
FEV1-%Pred-Post: 79 %
FEV1-Post: 1.9 L
FEV1-Pre: 1.88 L
FEV1FVC-%Change-Post: 2 %
FEV1FVC-%PRED-PRE: 107 %
FEV6-%CHANGE-POST: -1 %
FEV6-%PRED-POST: 75 %
FEV6-%Pred-Pre: 76 %
FEV6-POST: 2.28 L
FEV6-Pre: 2.32 L
FEV6FVC-%Pred-Post: 104 %
FEV6FVC-%Pred-Pre: 104 %
FVC-%CHANGE-POST: -1 %
FVC-%Pred-Post: 71 %
FVC-%Pred-Pre: 73 %
FVC-POST: 2.28 L
FVC-Pre: 2.32 L
POST FEV1/FVC RATIO: 83 %
Post FEV6/FVC ratio: 100 %
Pre FEV1/FVC ratio: 81 %
Pre FEV6/FVC Ratio: 100 %
RV % pred: 69 %
RV: 1.67 L
TLC % PRED: 72 %
TLC: 3.93 L

## 2013-06-09 MED ORDER — LIDOCAINE-PRILOCAINE 2.5-2.5 % EX CREA: 1.0000 "application " | TOPICAL_CREAM | CUTANEOUS | Status: DC | PRN

## 2013-06-09 NOTE — Telephone Encounter (Signed)
Spoke with husband who reports patient had a better night.  She had nightsweats but no fevers.  She is resting this am.  He requested emla cream.   We will forward to the pharmacy.  We will follow her condition closely.

## 2013-06-09 NOTE — Telephone Encounter (Signed)
Dr. Keturah Barre Chism called patient himself for chemotherapy f/u.

## 2013-06-09 NOTE — Telephone Encounter (Signed)
Message copied by Cherylynn Ridges on Thu Jun 09, 2013  3:39 PM ------      Message from: Prentiss Bells      Created: Wed Jun 08, 2013  3:15 PM      Regarding: 1st chemo       Adria, vinblastine, dacrbazine ------

## 2013-06-10 ENCOUNTER — Other Ambulatory Visit: Payer: Medicare Other

## 2013-06-10 ENCOUNTER — Other Ambulatory Visit (HOSPITAL_BASED_OUTPATIENT_CLINIC_OR_DEPARTMENT_OTHER): Payer: Medicare Other

## 2013-06-10 DIAGNOSIS — C8190 Hodgkin lymphoma, unspecified, unspecified site: Secondary | ICD-10-CM | POA: Diagnosis not present

## 2013-06-10 LAB — CBC WITH DIFFERENTIAL/PLATELET
BASO%: 0.2 % (ref 0.0–2.0)
BASOS ABS: 0 10*3/uL (ref 0.0–0.1)
EOS ABS: 0 10*3/uL (ref 0.0–0.5)
EOS%: 0 % (ref 0.0–7.0)
HCT: 25.7 % — ABNORMAL LOW (ref 34.8–46.6)
HEMOGLOBIN: 8.3 g/dL — AB (ref 11.6–15.9)
LYMPH%: 4.8 % — ABNORMAL LOW (ref 14.0–49.7)
MCH: 33.7 pg (ref 25.1–34.0)
MCHC: 32.2 g/dL (ref 31.5–36.0)
MCV: 104.6 fL — AB (ref 79.5–101.0)
MONO#: 1 10*3/uL — AB (ref 0.1–0.9)
MONO%: 7.3 % (ref 0.0–14.0)
NEUT%: 87.7 % — ABNORMAL HIGH (ref 38.4–76.8)
NEUTROS ABS: 12 10*3/uL — AB (ref 1.5–6.5)
PLATELETS: 97 10*3/uL — AB (ref 145–400)
RBC: 2.46 10*6/uL — ABNORMAL LOW (ref 3.70–5.45)
RDW: 18.6 % — ABNORMAL HIGH (ref 11.2–14.5)
WBC: 13.7 10*3/uL — AB (ref 3.9–10.3)
lymph#: 0.7 10*3/uL — ABNORMAL LOW (ref 0.9–3.3)

## 2013-06-10 LAB — COMPREHENSIVE METABOLIC PANEL (CC13)
ALK PHOS: 452 U/L — AB (ref 40–150)
ALT: 54 U/L (ref 0–55)
AST: 106 U/L — ABNORMAL HIGH (ref 5–34)
Albumin: 3.2 g/dL — ABNORMAL LOW (ref 3.5–5.0)
Anion Gap: 9 mEq/L (ref 3–11)
BILIRUBIN TOTAL: 2.17 mg/dL — AB (ref 0.20–1.20)
BUN: 24.7 mg/dL (ref 7.0–26.0)
CO2: 21 mEq/L — ABNORMAL LOW (ref 22–29)
Calcium: 9.4 mg/dL (ref 8.4–10.4)
Chloride: 105 mEq/L (ref 98–109)
Creatinine: 0.7 mg/dL (ref 0.6–1.1)
GLUCOSE: 270 mg/dL — AB (ref 70–140)
Potassium: 4.7 mEq/L (ref 3.5–5.1)
Sodium: 135 mEq/L — ABNORMAL LOW (ref 136–145)
Total Protein: 5.7 g/dL — ABNORMAL LOW (ref 6.4–8.3)

## 2013-06-10 LAB — URIC ACID (CC13): URIC ACID, SERUM: 5.9 mg/dL (ref 2.6–7.4)

## 2013-06-10 LAB — LACTATE DEHYDROGENASE (CC13): LDH: 893 U/L — ABNORMAL HIGH (ref 125–245)

## 2013-06-12 ENCOUNTER — Telehealth: Payer: Self-pay | Admitting: Internal Medicine

## 2013-06-12 NOTE — Telephone Encounter (Signed)
I spoke with patient's husband. She feels better this morning. Her eye is draining but appears to be getting better overall. She was afebrile. She denied dysuria.  We will have her draw her labs early this week to determine if she needs additional blood products. Patient and husband was agreeable with this plan.

## 2013-06-13 ENCOUNTER — Non-Acute Institutional Stay (HOSPITAL_COMMUNITY)
Admission: AD | Admit: 2013-06-13 | Discharge: 2013-06-13 | Disposition: A | Discharge status: Home Health | Payer: Medicare Other | Source: Ambulatory Visit | Attending: Internal Medicine | Admitting: Internal Medicine

## 2013-06-13 ENCOUNTER — Telehealth: Payer: Self-pay

## 2013-06-13 ENCOUNTER — Ambulatory Visit (HOSPITAL_BASED_OUTPATIENT_CLINIC_OR_DEPARTMENT_OTHER): Payer: Medicare Other

## 2013-06-13 ENCOUNTER — Ambulatory Visit (HOSPITAL_COMMUNITY)
Admission: RE | Admit: 2013-06-13 | Discharge: 2013-06-13 | Disposition: A | Discharge status: Home / Self Care | Payer: Medicare Other | Source: Ambulatory Visit | Attending: Internal Medicine | Admitting: Internal Medicine

## 2013-06-13 ENCOUNTER — Other Ambulatory Visit: Payer: Self-pay

## 2013-06-13 ENCOUNTER — Other Ambulatory Visit: Payer: Self-pay | Admitting: Internal Medicine

## 2013-06-13 ENCOUNTER — Telehealth: Payer: Self-pay | Admitting: Internal Medicine

## 2013-06-13 DIAGNOSIS — R17 Unspecified jaundice: Secondary | ICD-10-CM

## 2013-06-13 DIAGNOSIS — T451X5A Adverse effect of antineoplastic and immunosuppressive drugs, initial encounter: Principal | ICD-10-CM

## 2013-06-13 DIAGNOSIS — D6481 Anemia due to antineoplastic chemotherapy: Secondary | ICD-10-CM

## 2013-06-13 DIAGNOSIS — C184 Malignant neoplasm of transverse colon: Secondary | ICD-10-CM

## 2013-06-13 DIAGNOSIS — C8190 Hodgkin lymphoma, unspecified, unspecified site: Secondary | ICD-10-CM | POA: Diagnosis not present

## 2013-06-13 LAB — CBC WITH DIFFERENTIAL/PLATELET
BASO%: 0 % (ref 0.0–2.0)
Basophils Absolute: 0 10*3/uL (ref 0.0–0.1)
EOS ABS: 0 10*3/uL (ref 0.0–0.5)
EOS%: 0.5 % (ref 0.0–7.0)
HCT: 20.9 % — ABNORMAL LOW (ref 34.8–46.6)
HGB: 6.9 g/dL — CL (ref 11.6–15.9)
LYMPH#: 0.9 10*3/uL (ref 0.9–3.3)
LYMPH%: 23.4 % (ref 14.0–49.7)
MCH: 33 pg (ref 25.1–34.0)
MCHC: 33 g/dL (ref 31.5–36.0)
MCV: 100 fL (ref 79.5–101.0)
MONO#: 0.3 10*3/uL (ref 0.1–0.9)
MONO%: 7.7 % (ref 0.0–14.0)
NEUT%: 68.4 % (ref 38.4–76.8)
NEUTROS ABS: 2.5 10*3/uL (ref 1.5–6.5)
Platelets: 176 10*3/uL (ref 145–400)
RBC: 2.09 10*6/uL — ABNORMAL LOW (ref 3.70–5.45)
RDW: 16.6 % — AB (ref 11.2–14.5)
WBC: 3.6 10*3/uL — AB (ref 3.9–10.3)
nRBC: 1 % — ABNORMAL HIGH (ref 0–0)

## 2013-06-13 LAB — URINALYSIS, MICROSCOPIC - CHCC
BLOOD: NEGATIVE
Bilirubin (Urine): NEGATIVE
Glucose: NEGATIVE mg/dL
Ketones: NEGATIVE mg/dL
Leukocyte Esterase: NEGATIVE
Nitrite: NEGATIVE
Protein: NEGATIVE mg/dL
RBC / HPF: NEGATIVE (ref 0–2)
Specific Gravity, Urine: 1.01 (ref 1.003–1.035)
UROBILINOGEN UR: 0.2 mg/dL (ref 0.2–1)
pH: 6 (ref 4.6–8.0)

## 2013-06-13 LAB — PREPARE RBC (CROSSMATCH)

## 2013-06-13 LAB — HOLD TUBE, BLOOD BANK

## 2013-06-13 MED ORDER — FUROSEMIDE 10 MG/ML IJ SOLN
20.0000 mg | Freq: Once | INTRAMUSCULAR | Status: DC
  Filled 2013-06-13: qty 2

## 2013-06-13 MED ORDER — HEPARIN SOD (PORK) LOCK FLUSH 100 UNIT/ML IV SOLN: 250.0000 [IU] | INTRAVENOUS | Status: DC | PRN

## 2013-06-13 MED ORDER — SODIUM CHLORIDE 0.9 % IJ SOLN: 10.0000 mL | INTRAMUSCULAR | Status: DC | PRN

## 2013-06-13 MED ORDER — DIPHENHYDRAMINE HCL 25 MG PO CAPS
25.0000 mg | ORAL_CAPSULE | Freq: Once | ORAL | Status: DC
Start: 2013-06-13 — End: 2013-06-13

## 2013-06-13 MED ORDER — HEPARIN SOD (PORK) LOCK FLUSH 100 UNIT/ML IV SOLN
500.0000 [IU] | Freq: Every day | INTRAVENOUS | Status: DC | PRN
Start: 2013-06-13 — End: 2013-06-13

## 2013-06-13 MED ORDER — SODIUM CHLORIDE 0.9 % IV SOLN: 250.0000 mL | Freq: Once | INTRAVENOUS | Status: DC

## 2013-06-13 MED ORDER — SODIUM CHLORIDE 0.9 % IJ SOLN: 3.0000 mL | INTRAMUSCULAR | Status: DC | PRN

## 2013-06-13 MED ORDER — ACETAMINOPHEN 325 MG PO TABS: 650.0000 mg | ORAL_TABLET | Freq: Once | ORAL | Status: DC

## 2013-06-13 NOTE — Discharge Instructions (Signed)
Patient instructed to keep blue band on until she receives the blood. Patient verbalizes understanding.  I advised patient that as soon as I heard from blood bank tomorrow I would give her a call.  Explained to patient if she felt worse, dizzy, light headed, or like she was going to pass out, go to the ED if needed.  Patient verbalizes understanding.

## 2013-06-13 NOTE — Telephone Encounter (Signed)
Called pt and left message regarding lab and MD for tomorrow, advised pt to get appt calendar

## 2013-06-13 NOTE — Telephone Encounter (Signed)
Husband called about getting labs checked. S/w Dr Juliann Mule. Will set up pt for cbc and T&H for about noon. Will call pt back with exact time. Pt stated she is a bit SOB, but also her ankles are terribly swollen. [id number] called pt back with 1200 time for lab.

## 2013-06-13 NOTE — Progress Notes (Signed)
Received call from blood bank stating patient has blood antibodies found. Blood bank inquiring if patient has received blood transfusion in the past at another Facility outside of [facility]. Patient states she has not received blood transfusion outside of [facility]. Blood Bank notified, blood bank is to return call to [place] when has approximate time of blood being ready. Patient notified and acknowledged.

## 2013-06-13 NOTE — Progress Notes (Signed)
Called and spoke with [facility] @ ext. [phone], and advised that per blood bank, patient has antibodies and will not be able to get blood today.  Explained that I will notify the patient once the blood is here.

## 2013-06-13 NOTE — H&P (Signed)
Blood transfusion only.

## 2013-06-13 NOTE — H&P (Signed)
Patient receiving blood only today.

## 2013-06-14 ENCOUNTER — Telehealth: Payer: Self-pay

## 2013-06-14 ENCOUNTER — Encounter (HOSPITAL_COMMUNITY): Payer: Self-pay | Admitting: Internal Medicine

## 2013-06-14 ENCOUNTER — Other Ambulatory Visit: Payer: Self-pay | Admitting: Internal Medicine

## 2013-06-14 ENCOUNTER — Ambulatory Visit (HOSPITAL_BASED_OUTPATIENT_CLINIC_OR_DEPARTMENT_OTHER): Payer: Medicare Other

## 2013-06-14 ENCOUNTER — Telehealth: Payer: Self-pay | Admitting: Internal Medicine

## 2013-06-14 VITALS — BP 129/68 | HR 83 | Temp 98.4°F | Resp 16

## 2013-06-14 DIAGNOSIS — T451X5A Adverse effect of antineoplastic and immunosuppressive drugs, initial encounter: Secondary | ICD-10-CM | POA: Diagnosis not present

## 2013-06-14 DIAGNOSIS — D649 Anemia, unspecified: Secondary | ICD-10-CM | POA: Diagnosis not present

## 2013-06-14 DIAGNOSIS — D6481 Anemia due to antineoplastic chemotherapy: Secondary | ICD-10-CM | POA: Diagnosis not present

## 2013-06-14 DIAGNOSIS — D589 Hereditary hemolytic anemia, unspecified: Secondary | ICD-10-CM

## 2013-06-14 LAB — CULTURE, BLOOD (ROUTINE X 2)
CULTURE: NO GROWTH
Culture: NO GROWTH

## 2013-06-14 MED ORDER — DIPHENHYDRAMINE HCL 25 MG PO CAPS
25.0000 mg | ORAL_CAPSULE | Freq: Once | ORAL | Status: AC
  Administered 2013-06-14: 25 mg via ORAL

## 2013-06-14 MED ORDER — ACETAMINOPHEN 325 MG PO TABS
ORAL_TABLET | ORAL | Status: AC
  Filled 2013-06-14: qty 2

## 2013-06-14 MED ORDER — SODIUM CHLORIDE 0.9 % IJ SOLN
10.0000 mL | INTRAMUSCULAR | Status: AC | PRN
  Administered 2013-06-14: 10 mL
  Filled 2013-06-14: qty 10

## 2013-06-14 MED ORDER — FUROSEMIDE 10 MG/ML IJ SOLN
20.0000 mg | Freq: Once | INTRAMUSCULAR | Status: AC
  Administered 2013-06-14: 20 mg via INTRAVENOUS

## 2013-06-14 MED ORDER — METHYLPREDNISOLONE SODIUM SUCC 40 MG IJ SOLR
40.0000 mg | Freq: Once | INTRAMUSCULAR | Status: AC
Start: 2013-06-14 — End: 2013-06-14
  Administered 2013-06-14: 40 mg via INTRAVENOUS

## 2013-06-14 MED ORDER — ACETAMINOPHEN 325 MG PO TABS
650.0000 mg | ORAL_TABLET | Freq: Once | ORAL | Status: AC
  Administered 2013-06-14: 650 mg via ORAL

## 2013-06-14 MED ORDER — METHYLPREDNISOLONE SODIUM SUCC 40 MG IJ SOLR
INTRAMUSCULAR | Status: AC
  Filled 2013-06-14: qty 1

## 2013-06-14 MED ORDER — DIPHENHYDRAMINE HCL 25 MG PO CAPS
ORAL_CAPSULE | ORAL | Status: AC
  Filled 2013-06-14: qty 1

## 2013-06-14 MED ORDER — SODIUM CHLORIDE 0.9 % IV SOLN
250.0000 mL | Freq: Once | INTRAVENOUS | Status: AC
  Administered 2013-06-14: 250 mL via INTRAVENOUS

## 2013-06-14 MED ORDER — HEPARIN SOD (PORK) LOCK FLUSH 100 UNIT/ML IV SOLN
500.0000 [IU] | Freq: Every day | INTRAVENOUS | Status: AC | PRN
  Administered 2013-06-14: 500 [IU]
  Filled 2013-06-14: qty 5

## 2013-06-14 NOTE — Progress Notes (Signed)
Patient ID: Diamond Boyd, female   DOB: 1940/08/17, 73 y.o.   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 Blood bank requesting MD to sign for approval of transfusion and return call back to blood bank with name of MD approval. Tye Maryland RN for Dr. Juliann Mule notified. Cathy to address with Dr. Juliann Mule and notify patient.

## 2013-06-14 NOTE — Telephone Encounter (Signed)
S/w husband to bring pt to cancer center to receive her blood today.

## 2013-06-14 NOTE — Patient Instructions (Signed)
Blood Transfusion  A blood transfusion replaces your blood or some of its parts. Blood is replaced when you have lost blood because of surgery, an accident, or for severe blood conditions like anemia. You can donate blood to be used on yourself if you have a planned surgery. If you lose blood during that surgery, your own blood can be given back to you. Any blood given to you is checked to make sure it matches your blood type. Your temperature, blood pressure, and heart rate (vital signs) will be checked often.  GET HELP RIGHT AWAY IF:   You feel sick to your stomach (nauseous) or throw up (vomit).  You have watery poop (diarrhea).  You have shortness of breath or trouble breathing.  You have blood in your pee (urine) or have dark colored pee.  You have chest pain or tightness.  Your eyes or skin turn yellow (jaundice).  You have a temperature by mouth above 102 F (38.9 C), not controlled by medicine.  You start to shake and have chills.  You develop a a red rash (hives) or feel itchy.  You develop lightheadedness or feel confused.  You develop back, joint, or muscle pain.  You do not feel hungry (lost appetite).  You feel tired, restless, or nervous.  You develop belly (abdominal) cramps. Document Released: 04/25/2008 Document Revised: 04/21/2011 Document Reviewed: 04/25/2008 [employer] Patient Information 2014 [employer].  

## 2013-06-15 ENCOUNTER — Encounter: Payer: Self-pay | Admitting: Internal Medicine

## 2013-06-15 ENCOUNTER — Other Ambulatory Visit: Payer: Medicare Other

## 2013-06-15 ENCOUNTER — Ambulatory Visit (HOSPITAL_BASED_OUTPATIENT_CLINIC_OR_DEPARTMENT_OTHER): Payer: Medicare Other | Admitting: Internal Medicine

## 2013-06-15 VITALS — BP 154/73 | HR 90 | Temp 98.0°F | Resp 18 | Ht 66.0 in | Wt 230.1 lb

## 2013-06-15 DIAGNOSIS — C8190 Hodgkin lymphoma, unspecified, unspecified site: Secondary | ICD-10-CM | POA: Diagnosis not present

## 2013-06-15 DIAGNOSIS — E041 Nontoxic single thyroid nodule: Secondary | ICD-10-CM | POA: Diagnosis not present

## 2013-06-15 DIAGNOSIS — R609 Edema, unspecified: Secondary | ICD-10-CM

## 2013-06-15 DIAGNOSIS — R17 Unspecified jaundice: Secondary | ICD-10-CM | POA: Diagnosis not present

## 2013-06-15 DIAGNOSIS — R911 Solitary pulmonary nodule: Secondary | ICD-10-CM | POA: Diagnosis not present

## 2013-06-15 DIAGNOSIS — D649 Anemia, unspecified: Secondary | ICD-10-CM

## 2013-06-15 DIAGNOSIS — Z862 Personal history of diseases of the blood and blood-forming organs and certain disorders involving the immune mechanism: Secondary | ICD-10-CM

## 2013-06-15 DIAGNOSIS — C184 Malignant neoplasm of transverse colon: Secondary | ICD-10-CM | POA: Diagnosis not present

## 2013-06-15 LAB — TYPE AND SCREEN
ABO/RH(D): A POS
Antibody Screen: POSITIVE
DAT, IgG: POSITIVE
DONOR AG TYPE: NEGATIVE
Donor AG Type: NEGATIVE
Unit division: 0
Unit division: 0

## 2013-06-15 MED ORDER — FUROSEMIDE 20 MG PO TABS: 20.0000 mg | ORAL_TABLET | Freq: Every day | ORAL | Status: DC

## 2013-06-16 ENCOUNTER — Other Ambulatory Visit: Payer: Medicare Other

## 2013-06-16 NOTE — Progress Notes (Signed)
Daniel, MD [address] [place] [id number]  DIAGNOSIS: Hodgkin lymphoma - Plan: CBC with Differential, Basic metabolic panel (Bmet) - [facility], Lactate dehydrogenase (LDH) - [facility], CBC with Differential, CBC with Differential, Comprehensive metabolic panel (Cmet) - [facility], Lactate dehydrogenase (LDH) - [facility]  Edema - Plan: furosemide (LASIX) 20 MG tablet  History of anemia - Plan: CBC with Differential, CBC with Differential, Comprehensive metabolic panel (Cmet) - [facility], Lactate dehydrogenase (LDH) - [facility]  Chief Complaint  Patient presents with  . Lymphoma   CURRENT THERAPY:  Started AVD (without Bleomycin due to moderate restriction per PFTs) on 06/08/2013.  We will repeat PFTs following 2 cycles of AVD to consider addition of Bleomycin.   INTERVAL HISTORY: Diamond Boyd 73 y.o. 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  She was last seen by me on 06/08/2013.  She is accompanied by her husband Diamond [address].     Pt states she has had pink eye in her (R) eye is improving since starting valtrex and chemotherapy.    She had to cancel her eye doctor's appointment. She tolerated her first cycle of AVD without Bleo on 06/08/2013. She received two units of packed RBC yesterday.  She had multiple antibodies in her blood which made matching difficult.  She reports improved energy since receiving the two units of blood.  She denies further night sweats or fevers.   She denies specifically any pain, difficulty eating, trouble with her bowels, blood in the stool.     MEDICAL HISTORY: Past Medical History  Diagnosis Date  . Colon polyps   . Depression   . Hypothyroidism   . Osteoarthritis     hands  .  Peripheral vascular disease   . Degenerative disk disease     spine in center in past   . 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 Cancer     skin cancer- basal cell on arm / colon 2011  . Lymphoma   . Colon cancer     INTERIM HISTORY: has MALIGNANT NEOPLASM OF TRANSVERSE COLON; LYMPHOMA NEC, MLIG, SPLEEN; HYPERCHOLESTEROLEMIA, PURE; DISORDERS OF PHOSPHORUS METABOLISM; Anxiety state, unspecified; DEPRESSION; PERIPHERAL VASCULAR DISEASE; Other chronic sinusitis; ALLERGIC  RHINITIS; GERD; OVERACTIVE BLADDER; MENOPAUSAL SYNDROME; OSTEOARTHRITIS; LEG EDEMA; THROAT PAIN, CHRONIC; SKIN CANCER, HX OF; COLONIC POLYPS, HX OF; Elevated blood pressure; Obesity; Hypothyroid; Varicose veins; Other asplenic status; History of anemia; Screening for lipoid disorders; Other screening mammogram; Routine gynecological examination; Colon cancer screening; Skin lesion of face; Thoracic back pain; Left knee pain; Encounter for Medicare annual wellness exam; Cystocele; Herpes zoster; Vaginal pain; Other malaise and fatigue; Acute sinusitis; Left temporal headache; Hyperglycemia; Conjunctivitis, acute; Cold sore; Hodgkin lymphoma; and Dysuria on her problem list.    ALLERGIES:  is allergic to achromycin; allopurinol; astelin; cephalexin; codeine; meloxicam; minocycline; nabumetone; nyquil; penicillins; sulfa antibiotics; zolpidem tartrate; buspar; and ciprofloxacin.  MEDICATIONS: has a current medication list which includes the following prescription(s): acyclovir, calcium carbonate-vitamin d, celecoxib, cholecalciferol, clarithromycin, dexamethasone, epipen 2-pak, estradiol, fexofenadine, fluoxetine, fluticasone, furosemide, levothyroxine, lidocaine-prilocaine, lorazepam, nitrofurantoin (macrocrystal-monohydrate), omega-3, omeprazole, ondansetron, prochlorperazine, psyllium, simvastatin, tobramycin-dexamethasone, and vitamin e, and the following Facility-Administered Medications: sodium chloride.  SURGICAL HISTORY:  Past  Surgical History  Procedure Laterality Date  .  Cholecystectomy    . Splenectomy      lymphoma  . Breast biopsy  1996  . Plantar fascia release    . Ventral hernia repair  1998  . Tendon release      Right thumb   . Colectomy  8/11  . Vaginal hysterectomy    . Bone marrow biopsy  05/27/13   PROBLEM LIST:  1. Adenocarcinoma of the colon with 2 synchronous primaries dating back to August 2011 when the patient was found to have anemia. Both tumors were associated with negative lymph nodes. One tumor was T2 N0. The other tumor was T3 N0. The T2 N0 stage I tumor was in the sigmoid colon. The T3 N0 stage II tumor was in the transverse colon. The smaller tumor measured 1.5 cm. The larger tumor measured 6.5 cm. Surgery was carried out on 09/28/2009. 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  2. History of marginal zone lymphoma probably dating back to 7. The patient had bone marrow involvement and splenomegaly at that time, also a hemolytic anemia. She underwent a splenectomy on  November 12, 1994. Through the years, the patient has received Rituxan most recently, dating back to May 2006. The patient received fludarabine in late 2002 and early 2003. She may have developed a pneumonitis from the fludarabine in December 2002. The patient has had involvement of the peripheral blood with villous lymphocytes. She appears to be in a state of complete clinical remission at this time. Tumor is CD5 and CD20 positive, also CD11c and FMC7 positive.  3. 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  4. History of lupus anticoagulant dating back to the 1990s.  5. History of iron deficiency anemia for which the patient received IV Feraheme in January 2012. Presumably, this occurred as a result of her colon cancer.  6. History of GERD resulting in cough diagnosed around 2010.  7. Dyslipidemia.  8. Hypothyroidism.  9. Irritable bowel syndrome.  10. Goiter  seen on PET scan from 09/04/2009.  11. Enlarged left tonsil diagnosed April 2011, currently resolved.  12. History of depression.   REVIEW OF SYSTEMS:   Constitutional: Denies fevers, chills or abnormal weight loss Eyes: Denies blurriness of vision Ears, nose, mouth, throat, and face: Denies mucositis or sore throat Respiratory: Denies cough, dyspnea or wheezes Cardiovascular: Denies palpitation, chest discomfort or lower extremity swelling Gastrointestinal:  Denies nausea, heartburn or change in bowel habits Skin: Denies abnormal skin rashes Lymphatics: Denies new lymphadenopathy or easy bruising Neurological:Denies numbness, tingling or new weaknesses Behavioral/Psych: Mood is stable, no new changes  All other systems were reviewed with the patient and are negative.  PHYSICAL EXAMINATION: ECOG PERFORMANCE STATUS: 1 - Symptomatic but completely ambulatory  Blood pressure 154/73, pulse 90, temperature 98 F (36.7 C), temperature source Oral, resp. rate 18, height 5' 6"  (1.676 m), weight 230 lb 1.6 oz (104.373 kg), last menstrual period 02/10/1970, SpO2 97.00%.  GENERAL:alert, mildly distressed but comfortable; moderately obese. Non-toxic appearing SKIN: skin color, texture, turgor are normal, no rashes or significant lesions EYES: normal, Conjunctiva are pink, sclera clear; R eye slightly injected in the medial eyelid portion.  OROPHARYNX:no exudate, no erythema and lips, buccal mucosa, and tongue normal  NECK: supple, thyroid normal size, non-tender, without nodularity.  Left neck adenopathy.   LYMPH:  no palpable lymphadenopathy in the cervical, axillary or supraclavicular LUNGS: clear to auscultation and percussion with normal breathing effort HEART: regular rate &  rhythm and no murmurs and no lower extremity edema ABDOMEN:abdomen soft, non-tender and normal bowel sounds; multiple surgical scars well healed.   Musculoskeletal:no cyanosis of digits and no clubbing  NEURO: alert &  oriented x 3 with fluent speech, no focal motor/sensory deficits  Labs:  Lab Results  Component Value Date   WBC 3.6* 06/13/2013   HGB 6.9* 06/13/2013   HCT 20.9* 06/13/2013   MCV 100.0 06/13/2013   PLT 176 06/13/2013   NEUTROABS 2.5 06/13/2013      Chemistry      Component Value Date/Time   NA 135* 06/10/2013 0911   NA 131* 06/07/2013 2300   K 4.7 06/10/2013 0911   K 4.1 06/07/2013 2300   CL 92* 06/07/2013 2300   CL 105 07/14/2012 0939   CO2 21* 06/10/2013 0911   CO2 21 06/07/2013 2300   BUN 24.7 06/10/2013 0911   BUN 17 06/07/2013 2300   CREATININE 0.7 06/10/2013 0911   CREATININE 0.74 06/07/2013 2300      Component Value Date/Time   CALCIUM 9.4 06/10/2013 0911   CALCIUM 9.3 06/07/2013 2300   CALCIUM 9.3 08/09/2009 0000   ALKPHOS 452* 06/10/2013 0911   ALKPHOS 529* 06/07/2013 2358   AST 106* 06/10/2013 0911   AST 121* 06/07/2013 2358   ALT 54 06/10/2013 0911   ALT 46* 06/07/2013 2358   BILITOT 2.17* 06/10/2013 0911   BILITOT 2.5* 06/07/2013 2358     IMAGING STUDIES:  1. Digital screening mammogram from 08/05/2010 was negative.  2. Chest x-Diamond Boyd, 2 view, from 12/09/2010 was negative.  3. Digital screening mammogram on 08/20/2011 showed no specific evidence of malignancy  4. Chest x-Diamond Boyd, 2 view, from 11/19/2011 showed atherosclerotic calcifications within the arch of the aorta. There was evidence of a prior cholecystectomy and splenectomy, otherwise chest x-Diamond Boyd was negative.  5. CT scan of abdomen and pelvis with IV contrast obtained on 01/21/2012 showed no clear evidence of colon cancer metastasis. A retrocrural lymph node had increased in size but periportal adenopathy and periaortic adenopathy was stable to decreased. The retrocrural lymph node refer to measures 8 mm on image 12, increased from 5 mm on a prior study.  6. Chest x-Diamond Boyd, 2 view, from 06/01/2012 showed no acute abnormalities. There was borderline enlargement of the cardiac silhouette.  7. Chest x-Diamond Boyd, 2 view, from 03/09/2013, showed new pulmonary  nodules in the right lung. Cannot rule out  metastatic disease. Correlation with CT recommended. (reviewed by me) 8. CT scan of abdomen and pelvis with IV contrast on 03/09/2013 showed small right lower lobe pulmonary nodules measuring up to 8 mm, new, suspicious for metastases. Small right juxtadiaphragmatic, upper abdominal, retroperitoneal, and left pelvic lymph nodes, stable versus mildly increased. Prior splenectomy, cholecystectomy, and right hemicolectomy. PET-CT is suggested for further evaluation.  (reviewed by me) 9. PET/CT on 03/21/2013 revealed intensely hypermetabolic nodule within the left lobe of thyroid gland. Concern for thyroid carcinoma. 2. Two intensely hypermetabolic right lower lobe pulmonary nodules. Differential includes thyroid cancer metastasis or colon cancer metastasis. 3. Intense uptake within the right sacrum is concerning metastasis. Milder abnormal uptake within the T9 vertebral body is indeterminate but concerning for metastasis.  PROCEDURES:  Colonoscopy was carried out by Dr. Erskine Emery on 10/10/2010.  IMAGING: 06/08/2013  CT CHEST WITH CONTRAST TECHNIQUE: Multidetector CT imaging of the chest was performed during intravenous contrast administration. CONTRAST: 27m OMNIPAQUE IOHEXOL 300 MG/ML SOLN COMPARISON: DG CHEST 2 VIEW dated 06/07/2013; CT ANGIO CHEST W/CM  &/OR WO/CM dated 04/23/2013  FINDINGS: At lung window settings there are multiple lobulated soft tissue density pulmonary parenchymal masses. The largest lies in the right lower lobe posteriorly and measures 2.1 cm in diameter. This has increased from 1.1 cm on the CT scan of April 23, 2013. There is no alveolar infiltrate. There is no pleural effusion. The cardiac chambers are normal in size. There are coronary artery calcifications. The caliber of the thoracic aorta is normal. There are no bulky mediastinal or hilar or axillary lymph nodes. There is no pleural nor pericardial effusion. There is a right sided  epicardial lymph node measuring 8 mm in short axis. The caliber of the thoracic aorta is normal. A Port-A-Cath appliance is in place with the tip of the catheter lying in the mid SVC. The left thyroid  lobe is enlarged where visualized. There are degenerative disc changes of the thoracic spine but no  evidence of a compression fracture. The sternum and observed portions of the ribs appear normal.  Within the upper abdomen the observed portions of the liver exhibit no acute abnormalities.  IMPRESSION: 1. There are bilateral pulmonary parenchymal masses which have increased in size since the CT scan of April 23, 2013. There is no alveolar infiltrate. 2. There is no pleural nor pericardial effusion. 3. No bulky mediastinal or hilar lymph nodes are demonstrated.  03/21/2013 PET CT NUCLEAR MEDICINE PET SKULL BASE TO THIGH FASTING BLOOD GLUCOSE: Value: 96 mg/dl  TECHNIQUE: 12.1 mCi F-18 FDG was injected intravenously. CT data was obtained and used for attenuation correction and anatomic localization only. (This was not acquired as a diagnostic CT examination.) Additional exam technical data entered on technologist worksheet. COMPARISON: NM PET IMAGE INITIAL (PI) SKULL BASE TO THIGH dated 09/04/2009; CT ABD/PELVIS W CM dated 03/09/2013 FINDINGS: NECK There is intense hypermetabolic focus within the medial aspect of  the left lobe of thyroid gland recent with SUV max 50. This correlates to low-density lesion measuring approximately 18 mm (image 45. CHEST There are two hypermetabolic nodules in the right lower lobe. The more superior nodule measures 18 mm (image 67) with SUV max 13.3. The more inferior nodule measures 8 mm (image 76) with SUV max 2.7. No hypermetabolic mediastinal lymph nodes. ABDOMEN/PELVIS No abnormal hypermetabolic activity within the liver, pancreas, adrenal glands, or spleen. No hypermetabolic lymph nodes in the abdomen or pelvis. SKELETON There is a lesion in the T9 vertebral body with SUV max 2.7  with no clear lesion on the CT portion. There is intense uptake within the right and central sacrum (image 146) with SUV max 10.4. This is also difficult to see on the CT portion. No focal hypermetabolic activity to suggest skeletal metastasis. IMPRESSION: 1. Intensely hypermetabolic nodule within the left lobe of thyroid gland. Concern for thyroid carcinoma. 2. Two intensely hypermetabolic right lower lobe pulmonary nodules. Differential includes thyroid cancer metastasis or colon cancer metastasis. 3. Intense uptake within the right sacrum is concerning metastasis. Milder abnormal uptake within the T9 vertebral body is indeterminate but concerning for metastasis.  PATHOLOGY:  Lung, needle/core biopsy(ies), RLL sup seg nodule pet+ - MALIGNANT LYMPHORETICULAR NEOPLASM, MOST CONSISTENT WITH CLASSICAL HODGKIN LYMPHOMA. - SEE COMMENT. Diagnosis Note The core biopsies reveal a diffuse lymphohistiocytic infiltrate. There are scattered Hodgkin Reed-Sternberg like cells with prominent nuclei. There are rare lacunar like cells. There are no significant eosinophils or plasma cells seen in the background. Immunohistochemistry reveals scattered CD30 positive cells with golgi staining. The cells are also positive for CD15 and PAX-5. CD45 interpretation is hampered  by diffuse background T-cells. CD3 and CD43 highlight abundant small T-cells. Only scattered B-cells are seen with CD79a and CD20. EBV in situ (EBER) is negative. CDX-2, cytokeratin 7 and cytokeratin 20 are negative for carcinoma. Overall, these findings are consistent with extranodal pulmonary involvement by classical Hodgkin lymphoma. The case was sent to [facility] for consultation (report [phone]), who agrees with the above interpretation. Dr. Juliann Mule was paged on 05/19/2013.  05/26/2013  Bone Marrow, Aspirate,Biopsy, and Clot, right iliac - HYPERCELLULAR BONE MARROW WITH ATYPICAL LYMPHOHISTIOCYTIC PROLIFERATION. - TRILINEAGE  HEMATOPOIESIS.- SEE COMMENT.PERIPHERAL BLOOD:- NORMOCYTIC-NORMOCHROMIC ANEMIA. - MONOCYTOSIS.Diagnosis Note The aspirate material is extremely limited for morphologic evaluation. However, the core biopsy shows numerous variably sized and ill-defined interstitial and paratrabecular atypical lymphohistiocytic aggregates mostly composed of small lymphocytes and histiocytes in addition to scattering of larger CD30 positive lymphoid appearing cells. The histologic and phenotypic features are similar to previously diagnosed classical Hodgkin's lymphoma ([id number]) and consistent with marrow involvement by the same disease process. (BNS:ecj/gt 05/27/2013)BASSAM SMIR MD Pathologist, Electronic Signature (Case signed 05/31/2013)  ASSESSMENT: [name] 73 y.o. female with a history of Hodgkin lymphoma - Plan: CBC with Differential, Basic metabolic panel (Bmet) - [facility], Lactate dehydrogenase (LDH) - [facility], CBC with Differential, CBC with Differential, Comprehensive metabolic panel (Cmet) - [facility], Lactate dehydrogenase (LDH) - [facility]  Edema - Plan: furosemide (LASIX) 20 MG tablet  History of anemia - Plan: CBC with Differential, CBC with Differential, Comprehensive metabolic panel (Cmet) - [facility], Lactate dehydrogenase (LDH) - [facility] now with Hodgkins lymphoma with B symptoms including (Fevers, night sweats and weight lost).   PLAN:  1. H.o of Marginal zone lymphoma now with multiple lung nodules consistent with Hodgkin's lymphoma with B symptoms.   --We had an extensive discussion and review of her pulmonary pathology and final report conclusive for classical hodgkin's disease with bone marrow invovlement.  She reported fevers night sweats which would be considered a constitutional B symptoms. Her ESR is 84 (high).  Based on advanced lymphoma, she received a bone marrow biopsy which demonstrated involvement of Hodgkin's lymphoma.  Her echo also was obtained demonstrating a normal EF.  She received a R port-a-cath.    Her PFTs as noted above demonstrated a moderate restriction (likely secondary to presence of the disease).   --We started chemotherapy with AVD x 6 cycles (category 2A) q28 days (doxorubicin, vinblastine, and dacarbazine) (Duggan DB, J[employer], 2003).  Based on moderate restriction component of her PFTs, we will hold bleomycin.   We will repeat PFTs status post 2 cycles of chemotherapy.   --The risks of AVD was provided as a handout and the patient will attend a chemotherapy teaching. These risks include but are not limited to bone marrow suppression which can result in life-threatening infections, hair loss, GI disturbances, fatigue, nausea/vomiting. He understood the benefits and risk and chose to proceed. We will give Day #15 chemotherapy as follows:   Day 1, Cycle 1B 06/22/2013  Doxorubicin (Adriamycin) 12.5 mg/m2 (decreased by 50% based on elevated liver function) Vinblastine 3 mg/m2 (decreased by 50% based elevated liver function) Dacarbazine 375 mg/m2   -She will have Day1, Cycle 2A on 05/27.  We will check weekly cbc to determine count recovery. We will obtain an restaging PET-CT following Cycle #2 (NCCN Guidelines, Version 2.2014, HODG-9) to assess response and further treatment based on [place] criteria for response.   --She has a good performance status and night sweats, fevers and weight lost are concerning for a B-symptom. She  has an elevated ESR and her bone marrow demonstrates involvement. Her CBC demonstrates mild anemia with thrombocytopenia. Her LDH is elevated. Her creatinine is normal. Her calcium and serum potassium is normal. She has small to medium tumor burden but low risk for tumor lysis syndrome Nadara Mustard [place], et al, the Tumor Lysis Syndrome. NEJM, 2011; 047:9987) and will therefore be hydrated aggressively with close monitoring.   --She was given anti-emetics and in addition we will start acyclovir 500 mg bid prophylaxis.   She has a listed allergy to allopurinol.   2. Colon  cancer now with multiple lung nodules in RLL.   --Ysabelle remains clinically stable. Her last colonoscopy was on  10/10/2010 by Dr. Deatra Ina.  We will make Dr. Deatra Ina of aware. CEA is stable at 1.9.     3. Thyroid nodule.    --Her PET/CT was also concerning for thyroid carcinoma. TSH on 01/04/2013 was 0.51.  Biopsy as reported above.    4. Right eye irritation -- She is being followed by dermatology.  She reports that she will require a biopsy of there right bottom eyelid to rule out malignancy. Pt states she has had pink eye in her (R) eye.   5. Hyperbilirubinemia with elevated AST.  --We obtained an abdominal ultrasound to rule out biliary obstruction.    6. Follow-up.  --We will plan to see Shelly again in 3 weeks for a brief symptom visit and for consideration of AVD Cycle 2a.  .   All questions were answered. The patient knows to call the clinic with any problems, questions or concerns. We can certainly see the patient much sooner if necessary.  I spent 15 minutes counseling the patient face to face. The total time spent in the appointment was 25 minutes.    Concha Norway, MD 06/16/2013 12:39 PM

## 2013-06-17 ENCOUNTER — Ambulatory Visit (HOSPITAL_BASED_OUTPATIENT_CLINIC_OR_DEPARTMENT_OTHER): Payer: Medicare Other | Admitting: Genetic Counselor

## 2013-06-17 ENCOUNTER — Telehealth: Payer: Self-pay | Admitting: *Deleted

## 2013-06-17 ENCOUNTER — Other Ambulatory Visit (HOSPITAL_BASED_OUTPATIENT_CLINIC_OR_DEPARTMENT_OTHER): Payer: Medicare Other

## 2013-06-17 ENCOUNTER — Telehealth: Payer: Self-pay | Admitting: Internal Medicine

## 2013-06-17 DIAGNOSIS — Z808 Family history of malignant neoplasm of other organs or systems: Secondary | ICD-10-CM | POA: Diagnosis not present

## 2013-06-17 DIAGNOSIS — IMO0002 Reserved for concepts with insufficient information to code with codable children: Secondary | ICD-10-CM | POA: Diagnosis not present

## 2013-06-17 DIAGNOSIS — C189 Malignant neoplasm of colon, unspecified: Secondary | ICD-10-CM

## 2013-06-17 DIAGNOSIS — C184 Malignant neoplasm of transverse colon: Secondary | ICD-10-CM

## 2013-06-17 DIAGNOSIS — C8190 Hodgkin lymphoma, unspecified, unspecified site: Secondary | ICD-10-CM | POA: Diagnosis not present

## 2013-06-17 DIAGNOSIS — Z862 Personal history of diseases of the blood and blood-forming organs and certain disorders involving the immune mechanism: Secondary | ICD-10-CM

## 2013-06-17 DIAGNOSIS — C8589 Other specified types of non-Hodgkin lymphoma, extranodal and solid organ sites: Secondary | ICD-10-CM | POA: Diagnosis not present

## 2013-06-17 DIAGNOSIS — Z85038 Personal history of other malignant neoplasm of large intestine: Secondary | ICD-10-CM | POA: Insufficient documentation

## 2013-06-17 DIAGNOSIS — D589 Hereditary hemolytic anemia, unspecified: Secondary | ICD-10-CM

## 2013-06-17 LAB — LACTATE DEHYDROGENASE (CC13): LDH: 423 U/L — ABNORMAL HIGH (ref 125–245)

## 2013-06-17 LAB — CBC WITH DIFFERENTIAL/PLATELET
BASO%: 0.2 % (ref 0.0–2.0)
Basophils Absolute: 0 10*3/uL (ref 0.0–0.1)
EOS%: 0.9 % (ref 0.0–7.0)
Eosinophils Absolute: 0.1 10*3/uL (ref 0.0–0.5)
HEMATOCRIT: 28.3 % — AB (ref 34.8–46.6)
HGB: 9.3 g/dL — ABNORMAL LOW (ref 11.6–15.9)
LYMPH%: 19.2 % (ref 14.0–49.7)
MCH: 31.4 pg (ref 25.1–34.0)
MCHC: 32.9 g/dL (ref 31.5–36.0)
MCV: 95.6 fL (ref 79.5–101.0)
MONO#: 0.7 10*3/uL (ref 0.1–0.9)
MONO%: 10 % (ref 0.0–14.0)
NEUT#: 4.5 10*3/uL (ref 1.5–6.5)
NEUT%: 69.7 % (ref 38.4–76.8)
PLATELETS: 298 10*3/uL (ref 145–400)
RBC: 2.96 10*6/uL — ABNORMAL LOW (ref 3.70–5.45)
RDW: 22 % — ABNORMAL HIGH (ref 11.2–14.5)
WBC: 6.5 10*3/uL (ref 3.9–10.3)
lymph#: 1.3 10*3/uL (ref 0.9–3.3)
nRBC: 4 % — ABNORMAL HIGH (ref 0–0)

## 2013-06-17 LAB — BASIC METABOLIC PANEL (CC13)
Anion Gap: 10 mEq/L (ref 3–11)
BUN: 10.3 mg/dL (ref 7.0–26.0)
CHLORIDE: 103 meq/L (ref 98–109)
CO2: 26 meq/L (ref 22–29)
CREATININE: 0.8 mg/dL (ref 0.6–1.1)
Calcium: 9.3 mg/dL (ref 8.4–10.4)
Glucose: 123 mg/dl (ref 70–140)
Potassium: 3.9 mEq/L (ref 3.5–5.1)
Sodium: 139 mEq/L (ref 136–145)

## 2013-06-17 LAB — HAPTOGLOBIN: Haptoglobin: 30 mg/dL — ABNORMAL LOW (ref 45–215)

## 2013-06-17 NOTE — Telephone Encounter (Signed)
lvm for pt regarding to new time for 5.27.

## 2013-06-17 NOTE — Telephone Encounter (Signed)
Per staff message and POF I have scheduled appts.  I have advised scheduler that the first available is 2pm on 5/27 JMW

## 2013-06-17 NOTE — Progress Notes (Signed)
HISTORY OF PRESENT ILLNESS: Dr. Glori Bickers requested a cancer genetics consultation for Diamond Boyd, a 73 y.o. female, due to a personal history of cancer.  Diamond Boyd presents to clinic today to discuss the possibility of a hereditary predisposition to cancer, genetic testing, and to further clarify her future cancer risks, as well as potential cancer risk for family members.   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   Cancer     skin cancer- basal cell on arm / colon 2011   Lymphoma - dx at age 21    Colon cancer synchronous tumors diagnosed at age 65     Past Surgical History  Procedure Laterality Date   Cholecystectomy     Splenectomy      lymphoma   Breast biopsy  1996   Plantar fascia release     Ventral hernia repair  1998   Tendon release      Right thumb    Colectomy  8/11   Vaginal hysterectomy     Bone marrow biopsy  05/27/13    History   Social History   Marital Status: Married    Spouse Name: N/A    Number of Children: 2   Years of Education: N/A   Occupational History   Retired     Social History Main Topics   Smoking status: Never Smoker    Smokeless tobacco: Never Used   Alcohol Use: No   Drug Use: No   Sexual Activity: Not on file   Other Topics Concern   Not on file   Social History Narrative   Chronically ill daughter lives with her- who also smokes and takes advantage of her.          Does not exercise      FAMILY HISTORY:  During the visit, a 4-generation pedigree was obtained. Significant diagnoses include the following:  Family History  Problem Relation Age of Onset   Coronary artery disease Mother    Alcohol abuse Mother    Cancer Father     brain   Diabetes Brother    Fibromyalgia Daughter      chronic pain    COPD Daughter    Colon cancer Neg Hx    Colon polyps Neg Hx    Stomach cancer Neg Hx     Diamond Boyd ancestry is of Caucasian descent. There is no known Jewish ancestry or consanguinity.  GENETIC COUNSELING ASSESSMENT: Diamond Boyd is a 73 y.o. female with a personal history of cancer suggestive of a hereditary predisposition to cancer. We, therefore, discussed and recommended the following at today's visit.   DISCUSSION: We reviewed the characteristics, features and inheritance patterns of hereditary cancer syndromes, including Lynch syndrome. We also discussed genetic testing, including the appropriate family members to test, the process of testing, insurance coverage and turn-around-time for results. We discussed the implications of a negative, positive and/or variant of uncertain significant result. We recommended Diamond Boyd pursue genetic testing for the [facility] gene panel.   PLAN: Based on our above recommendation, Diamond Boyd wished to pursue genetic testing and the blood sample was drawn and will be sent to [facility] for analysis. Results should be available within approximately 4 weeks time,  at which point they will be disclosed by telephone to Diamond Boyd, as will any additional recommendations warranted by these results. We encouraged Diamond Boyd to remain in contact with cancer genetics annually so that we can continuously update the family history and inform her of any changes in cancer genetics and testing that may be of benefit for this family. [name] questions were answered to her satisfaction today. Our contact information was provided should additional questions or concerns arise.   Thank you for the referral and allowing [place] to share in the care of your patient.   The patient was seen for a total of 40 minutes spent in face-to-face counseling.  This patient was discussed with Diamond Boyd who agrees with the above.     _______________________________________________________________________ For Office Staff:  Number of people involved in session: 3 Was an Intern/ student involved with case: no

## 2013-06-21 ENCOUNTER — Other Ambulatory Visit: Payer: Self-pay | Admitting: Pharmacist

## 2013-06-21 DIAGNOSIS — C8190 Hodgkin lymphoma, unspecified, unspecified site: Secondary | ICD-10-CM

## 2013-06-22 ENCOUNTER — Ambulatory Visit (HOSPITAL_BASED_OUTPATIENT_CLINIC_OR_DEPARTMENT_OTHER): Payer: Medicare Other

## 2013-06-22 ENCOUNTER — Other Ambulatory Visit (HOSPITAL_BASED_OUTPATIENT_CLINIC_OR_DEPARTMENT_OTHER): Payer: Medicare Other

## 2013-06-22 ENCOUNTER — Other Ambulatory Visit: Payer: Medicare Other

## 2013-06-22 ENCOUNTER — Other Ambulatory Visit: Payer: Self-pay | Admitting: Internal Medicine

## 2013-06-22 VITALS — BP 144/78 | HR 89 | Temp 97.8°F

## 2013-06-22 DIAGNOSIS — C8190 Hodgkin lymphoma, unspecified, unspecified site: Secondary | ICD-10-CM

## 2013-06-22 DIAGNOSIS — Z5189 Encounter for other specified aftercare: Secondary | ICD-10-CM | POA: Diagnosis not present

## 2013-06-22 DIAGNOSIS — C184 Malignant neoplasm of transverse colon: Secondary | ICD-10-CM

## 2013-06-22 DIAGNOSIS — R7989 Other specified abnormal findings of blood chemistry: Secondary | ICD-10-CM | POA: Diagnosis not present

## 2013-06-22 DIAGNOSIS — H01009 Unspecified blepharitis unspecified eye, unspecified eyelid: Secondary | ICD-10-CM | POA: Diagnosis not present

## 2013-06-22 DIAGNOSIS — L270 Generalized skin eruption due to drugs and medicaments taken internally: Secondary | ICD-10-CM

## 2013-06-22 LAB — CBC WITH DIFFERENTIAL/PLATELET
BASO%: 1.4 % (ref 0.0–2.0)
Basophils Absolute: 0 10*3/uL (ref 0.0–0.1)
EOS%: 3.3 % (ref 0.0–7.0)
Eosinophils Absolute: 0.1 10*3/uL (ref 0.0–0.5)
HCT: 29.7 % — ABNORMAL LOW (ref 34.8–46.6)
HGB: 9.6 g/dL — ABNORMAL LOW (ref 11.6–15.9)
LYMPH%: 48.8 % (ref 14.0–49.7)
MCH: 31.3 pg (ref 25.1–34.0)
MCHC: 32.3 g/dL (ref 31.5–36.0)
MCV: 96.7 fL (ref 79.5–101.0)
MONO#: 0.6 10*3/uL (ref 0.1–0.9)
MONO%: 26.1 % — AB (ref 0.0–14.0)
NEUT#: 0.4 10*3/uL — CL (ref 1.5–6.5)
NEUT%: 20.4 % — ABNORMAL LOW (ref 38.4–76.8)
NRBC: 0 % (ref 0–0)
Platelets: 291 10*3/uL (ref 145–400)
RBC: 3.07 10*6/uL — AB (ref 3.70–5.45)
RDW: 20.3 % — ABNORMAL HIGH (ref 11.2–14.5)
WBC: 2.1 10*3/uL — ABNORMAL LOW (ref 3.9–10.3)
lymph#: 1 10*3/uL (ref 0.9–3.3)

## 2013-06-22 LAB — BASIC METABOLIC PANEL (CC13)
Anion Gap: 14 mEq/L — ABNORMAL HIGH (ref 3–11)
BUN: 7.9 mg/dL (ref 7.0–26.0)
CALCIUM: 9.4 mg/dL (ref 8.4–10.4)
CO2: 23 meq/L (ref 22–29)
CREATININE: 0.7 mg/dL (ref 0.6–1.1)
Chloride: 104 mEq/L (ref 98–109)
Glucose: 215 mg/dl — ABNORMAL HIGH (ref 70–140)
Potassium: 3.6 mEq/L (ref 3.5–5.1)
Sodium: 141 mEq/L (ref 136–145)

## 2013-06-22 LAB — LACTATE DEHYDROGENASE (CC13): LDH: 278 U/L — ABNORMAL HIGH (ref 125–245)

## 2013-06-22 MED ORDER — TBO-FILGRASTIM 480 MCG/0.8ML ~~LOC~~ SOSY
480.0000 ug | PREFILLED_SYRINGE | Freq: Once | SUBCUTANEOUS | Status: AC
  Administered 2013-06-22: 480 ug via SUBCUTANEOUS
  Filled 2013-06-22: qty 0.8

## 2013-06-22 MED ORDER — PREDNISONE 20 MG PO TABS
20.0000 mg | ORAL_TABLET | Freq: Once | ORAL | Status: AC
Start: 2013-06-22 — End: 2013-06-22
  Administered 2013-06-22: 20 mg via ORAL

## 2013-06-22 MED ORDER — METHYLPREDNISOLONE (PAK) 4 MG PO TABS: ORAL_TABLET | ORAL | Status: DC

## 2013-06-22 NOTE — Patient Instructions (Signed)
Neutropenia Neutropenia is a condition that occurs when the level of a certain type of white blood cell (neutrophil) in your body becomes lower than normal. Neutrophils are made in the bone marrow and fight infections. These cells protect against bacteria and viruses. The fewer neutrophils you have, and the longer your body remains without them, the greater your risk of getting a severe infection becomes. CAUSES  The cause of neutropenia may be hard to determine. However, it is usually due to 3 main problems:   Decreased production of neutrophils. This may be due to:  Certain medicines such as chemotherapy.  Genetic problems.  Cancer.  Radiation treatments.  Vitamin deficiency.  Some pesticides.  Increased destruction of neutrophils. This may be due to:  Overwhelming infections.  Hemolytic anemia. This is when the body destroys its own blood cells.  Chemotherapy.  Neutrophils moving to areas of the body where they cannot fight infections. This may be due to:  Dialysis procedures.  Conditions where the spleen becomes enlarged. Neutrophils are held in the spleen and are not available to the rest of the body.  Overwhelming infections. The neutrophils are held in the area of the infection and are not available to the rest of the body. SYMPTOMS  There are no specific symptoms of neutropenia. The lack of neutrophils can result in an infection, and an infection can cause various problems. DIAGNOSIS  Diagnosis is made by a blood test. A complete blood count is performed. The normal level of neutrophils in human blood differs with age and race. Infants have lower counts than older children and adults. African Americans have lower counts than Caucasians or Asians. The average adult level is 1500 cells/mm3 of blood. Neutrophil counts are interpreted as follows:  Greater than 1000 cells/mm3 gives normal protection against infection.  500 to 1000 cells/mm3 gives an increased risk for  infection.  200 to 500 cells/mm3 is a greater risk for severe infection.  Lower than 200 cells/mm3 is a marked risk of infection. This may require hospitalization and treatment with antibiotic medicines. TREATMENT  Treatment depends on the underlying cause, severity, and presence of infections or symptoms. It also depends on your health. Your caregiver will discuss the treatment plan with you. Mild cases are often easily treated and have a good outcome. Preventative measures may also be started to limit your risk of infections. Treatment can include:  Taking antibiotics.  Stopping medicines that are known to cause neutropenia.  Correcting nutritional deficiencies by eating green vegetables to supply folic acid and taking vitamin B supplements.  Stopping exposure to pesticides if your neutropenia is related to pesticide exposure.  Taking a blood growth factor called sargramostim, pegfilgrastim, or filgrastim if you are undergoing chemotherapy for cancer. This stimulates white blood cell production.  Removal of the spleen if you have Felty's syndrome and have repeated infections. HOME CARE INSTRUCTIONS   Follow your caregiver's instructions about when you need to have blood work done.  Wash your hands often. Make sure others who come in contact with you also wash their hands.  Wash raw fruits and vegetables before eating them. They can carry bacteria and fungi.  Avoid people with colds or spreadable (contagious) diseases (chickenpox, herpes zoster, influenza).  Avoid large crowds.  Avoid construction areas. The dust can release fungus into the air.  Be cautious around children in daycare or school environments.  Take care of your respiratory system by coughing and deep breathing.  Bathe daily.  Protect your skin from cuts and   burns.  Do not work in the garden or with flowers and plants.  Care for the mouth before and after meals by brushing with a soft toothbrush. If you have  mucositis, do not use mouthwash. Mouthwash contains alcohol and can dry out the mouth even more.  Clean the area between the genitals and the anus (perineal area) after urination and bowel movements. Women need to wipe from front to back.  Use a water soluble lubricant during sexual intercourse and practice good hygiene after. Do not have intercourse if you are severely neutropenic. Check with your caregiver for guidelines.  Exercise daily as tolerated.  Avoid people who were vaccinated with a live vaccine in the past 30 days. You should not receive live vaccines (polio, typhoid).  Do not provide direct care for pets. Avoid animal droppings. Do not clean litter boxes and bird cages.  Do not share food utensils.  Do not use tampons, enemas, or rectal suppositories unless directed by your caregiver.  Use an electric razor to remove hair.  Wash your hands after handling magazines, letters, and newspapers. SEEK IMMEDIATE MEDICAL CARE IF:   You have a fever.  You have chills or start to shake.  You feel nauseous or vomit.  You develop mouth sores.  You develop aches and pains.  You have redness and swelling around open wounds.  Your skin is warm to the touch.  You have pus coming from your wounds.  You develop swollen lymph nodes.  You feel weak or fatigued.  You develop red streaks on the skin. MAKE SURE YOU:  Understand these instructions.  Will watch your condition.  Will get help right away if you are not doing well or get worse. Document Released: 07/19/2001 Document Revised: 04/21/2011 Document Reviewed: 08/16/2010 [employer] Patient Information 2014 [place], [place]. Filgrastim, G-CSF injection What is this medicine? FILGRASTIM, G-CSF (fil GRA stim) stimulates the formation of white blood cells. This medicine is given to patients with conditions that may cause a decrease in white blood cells, like those receiving certain types of chemotherapy or bone marrow  transplant. It helps the bone marrow recover its ability to produce white blood cells. Increasing the amount of white blood cells helps to decrease the risk of infection and fever. This medicine may be used for other purposes; ask your health care provider or pharmacist if you have questions. COMMON BRAND NAME(S): Neupogen What should I tell my health care provider before I take this medicine? They need to know if you have any of these conditions: -currently receiving radiation therapy -sickle cell disease -an unusual or allergic reaction to filgrastim, E. coli protein, other medicines, foods, dyes, or preservatives -pregnant or trying to get pregnant -breast-feeding How should I use this medicine? This medicine is for injection into a vein or injection under the skin. It is usually given by a health care professional in a hospital or clinic setting. If you get this medicine at home, you will be taught how to prepare and give this medicine. Always change the site for the injection under the skin. Let the solution warm to room temperature before you use it. Do not shake the solution before you withdraw a dose. Throw away any unused portion. Use exactly as directed. Take your medicine at regular intervals. Do not take your medicine more often than directed. It is important that you put your used needles and syringes in a special sharps container. Do not put them in a trash can. If you do not have  a sharps container, call your pharmacist or healthcare provider to get one. Talk to your pediatrician regarding the use of this medicine in children. While this medicine may be prescribed for children for selected conditions, precautions do apply. Overdosage: If you think you have taken too much of this medicine contact a poison control center or emergency room at once. NOTE: This medicine is only for you. Do not share this medicine with others. What if I miss a dose? Try not to miss doses. If you miss a  dose take the dose as soon as you remember. If it is almost time for the next dose, do not take double doses unless told to by your doctor or health care professional. What may interact with this medicine? -lithium -medicines for cancer chemotherapy This list may not describe all possible interactions. Give your health care provider a list of all the medicines, herbs, non-prescription drugs, or dietary supplements you use. Also tell them if you smoke, drink alcohol, or use illegal drugs. Some items may interact with your medicine. What should I watch for while using this medicine? Visit your doctor or health care professional for regular checks on your progress. If you get a fever or any sign of infection while you are using this medicine, do not treat yourself. Check with your doctor or health care professional. Bone pain can usually be relieved by mild pain relievers such as acetaminophen or ibuprofen. Check with your doctor or health care professional before taking these medicines as they may hide a fever. Call your doctor or health care professional if the aches and pains are severe or do not go away. What side effects may I notice from receiving this medicine? Side effects that you should report to your doctor or health care professional as soon as possible: -allergic reactions like skin rash, itching or hives, swelling of the face, lips, or tongue -difficulty breathing, wheezing -fever -pain, redness, or swelling at the injection site -stomach or side pain, or pain at the shoulder Side effects that usually do not require medical attention (report to your doctor or health care professional if they continue or are bothersome): -bone pain (ribs, lower back, breast bone) -headache -skin rash This list may not describe all possible side effects. Call your doctor for medical advice about side effects. You may report side effects to FDA at 1-800-FDA-1088. Where should I keep my medicine? Keep  out of the reach of children. Store in a refrigerator between 2 and 8 degrees C (36 and 46 degrees F). Do not freeze or leave in direct sunlight. If vials or syringes are left out of the refrigerator for more than 24 hours, they must be thrown away. Throw away unused vials after the expiration date on the carton. NOTE: This sheet is a summary. It may not cover all possible information. If you have questions about this medicine, talk to your doctor, pharmacist, or health care provider.  2014, [employer]. (2007-04-14 13:33:21)

## 2013-06-22 NOTE — Progress Notes (Signed)
Hold treatment today due to WBC per Dr.Chism's order.

## 2013-06-25 ENCOUNTER — Telehealth: Payer: Self-pay | Admitting: Internal Medicine

## 2013-06-25 NOTE — Telephone Encounter (Signed)
s.w. pt and advised on May appt.Marland KitchenMarland Kitchenpt will get new sched on 5.20

## 2013-06-29 ENCOUNTER — Ambulatory Visit (HOSPITAL_BASED_OUTPATIENT_CLINIC_OR_DEPARTMENT_OTHER): Payer: Medicare Other

## 2013-06-29 ENCOUNTER — Telehealth: Payer: Self-pay | Admitting: Internal Medicine

## 2013-06-29 ENCOUNTER — Other Ambulatory Visit: Payer: Self-pay | Admitting: Internal Medicine

## 2013-06-29 ENCOUNTER — Other Ambulatory Visit (HOSPITAL_BASED_OUTPATIENT_CLINIC_OR_DEPARTMENT_OTHER): Payer: Medicare Other

## 2013-06-29 VITALS — BP 151/81 | HR 85 | Temp 98.3°F | Resp 18

## 2013-06-29 DIAGNOSIS — Z862 Personal history of diseases of the blood and blood-forming organs and certain disorders involving the immune mechanism: Secondary | ICD-10-CM

## 2013-06-29 DIAGNOSIS — C8190 Hodgkin lymphoma, unspecified, unspecified site: Secondary | ICD-10-CM

## 2013-06-29 DIAGNOSIS — Z5111 Encounter for antineoplastic chemotherapy: Secondary | ICD-10-CM | POA: Diagnosis not present

## 2013-06-29 LAB — CBC WITH DIFFERENTIAL/PLATELET
BASO%: 0.6 % (ref 0.0–2.0)
Basophils Absolute: 0 10*3/uL (ref 0.0–0.1)
EOS%: 1 % (ref 0.0–7.0)
Eosinophils Absolute: 0 10*3/uL (ref 0.0–0.5)
HEMATOCRIT: 36.9 % (ref 34.8–46.6)
HGB: 12 g/dL (ref 11.6–15.9)
LYMPH#: 1.1 10*3/uL (ref 0.9–3.3)
LYMPH%: 26 % (ref 14.0–49.7)
MCH: 32.2 pg (ref 25.1–34.0)
MCHC: 32.4 g/dL (ref 31.5–36.0)
MCV: 99.5 fL (ref 79.5–101.0)
MONO#: 1.2 10*3/uL — AB (ref 0.1–0.9)
MONO%: 29 % — ABNORMAL HIGH (ref 0.0–14.0)
NEUT#: 1.8 10*3/uL (ref 1.5–6.5)
NEUT%: 43.4 % (ref 38.4–76.8)
Platelets: 267 10*3/uL (ref 145–400)
RBC: 3.71 10*6/uL (ref 3.70–5.45)
RDW: 21.7 % — ABNORMAL HIGH (ref 11.2–14.5)
WBC: 4.3 10*3/uL (ref 3.9–10.3)

## 2013-06-29 LAB — COMPREHENSIVE METABOLIC PANEL (CC13)
ALT: 28 U/L (ref 0–55)
ANION GAP: 14 meq/L — AB (ref 3–11)
AST: 29 U/L (ref 5–34)
Albumin: 3.6 g/dL (ref 3.5–5.0)
Alkaline Phosphatase: 215 U/L — ABNORMAL HIGH (ref 40–150)
BUN: 14.9 mg/dL (ref 7.0–26.0)
CALCIUM: 9.1 mg/dL (ref 8.4–10.4)
CHLORIDE: 103 meq/L (ref 98–109)
CO2: 24 meq/L (ref 22–29)
Creatinine: 0.7 mg/dL (ref 0.6–1.1)
Glucose: 152 mg/dl — ABNORMAL HIGH (ref 70–140)
Potassium: 3.6 mEq/L (ref 3.5–5.1)
Sodium: 142 mEq/L (ref 136–145)
Total Bilirubin: 0.33 mg/dL (ref 0.20–1.20)
Total Protein: 6.1 g/dL — ABNORMAL LOW (ref 6.4–8.3)

## 2013-06-29 LAB — LACTATE DEHYDROGENASE (CC13): LDH: 213 U/L (ref 125–245)

## 2013-06-29 MED ORDER — SODIUM CHLORIDE 0.9 % IV SOLN
6.0000 mg/m2 | Freq: Once | INTRAVENOUS | Status: AC
  Administered 2013-06-29: 13 mg via INTRAVENOUS
  Filled 2013-06-29: qty 13

## 2013-06-29 MED ORDER — HEPARIN SOD (PORK) LOCK FLUSH 100 UNIT/ML IV SOLN
500.0000 [IU] | Freq: Once | INTRAVENOUS | Status: AC | PRN
  Administered 2013-06-29: 500 [IU]
  Filled 2013-06-29: qty 5

## 2013-06-29 MED ORDER — DOXORUBICIN HCL CHEMO IV INJECTION 2 MG/ML
25.0000 mg/m2 | Freq: Once | INTRAVENOUS | Status: AC
  Administered 2013-06-29: 54 mg via INTRAVENOUS
  Filled 2013-06-29: qty 27

## 2013-06-29 MED ORDER — SODIUM CHLORIDE 0.9 % IJ SOLN
10.0000 mL | INTRAMUSCULAR | Status: DC | PRN
  Administered 2013-06-29: 10 mL
  Filled 2013-06-29: qty 10

## 2013-06-29 MED ORDER — ONDANSETRON 16 MG/50ML IVPB (CHCC)
16.0000 mg | Freq: Once | INTRAVENOUS | Status: AC
  Administered 2013-06-29: 16 mg via INTRAVENOUS

## 2013-06-29 MED ORDER — DEXAMETHASONE SODIUM PHOSPHATE 20 MG/5ML IJ SOLN
INTRAMUSCULAR | Status: AC
  Filled 2013-06-29: qty 5

## 2013-06-29 MED ORDER — SODIUM CHLORIDE 0.9 % IV SOLN
370.0000 mg/m2 | Freq: Once | INTRAVENOUS | Status: AC
  Administered 2013-06-29: 800 mg via INTRAVENOUS
  Filled 2013-06-29: qty 40

## 2013-06-29 MED ORDER — ONDANSETRON 16 MG/50ML IVPB (CHCC)
INTRAVENOUS | Status: AC
  Filled 2013-06-29: qty 16

## 2013-06-29 MED ORDER — SODIUM CHLORIDE 0.9 % IV SOLN
Freq: Once | INTRAVENOUS | Status: AC
  Administered 2013-06-29: 11:00:00 via INTRAVENOUS

## 2013-06-29 MED ORDER — DEXAMETHASONE SODIUM PHOSPHATE 20 MG/5ML IJ SOLN
20.0000 mg | Freq: Once | INTRAMUSCULAR | Status: AC
  Administered 2013-06-29: 20 mg via INTRAVENOUS

## 2013-06-29 NOTE — Patient Instructions (Signed)
[place] Discharge Instructions for Patients Receiving Chemotherapy  Today you received the following chemotherapy agents: Adriamycin, Vinblastine, DTIC  To help prevent nausea and vomiting after your treatment, we encourage you to take your nausea medication as prescribed.    If you develop nausea and vomiting that is not controlled by your nausea medication, call the clinic.   BELOW ARE SYMPTOMS THAT SHOULD BE REPORTED IMMEDIATELY:  *FEVER GREATER THAN 100.5 F  *CHILLS WITH OR WITHOUT FEVER  NAUSEA AND VOMITING THAT IS NOT CONTROLLED WITH YOUR NAUSEA MEDICATION  *UNUSUAL SHORTNESS OF BREATH  *UNUSUAL BRUISING OR BLEEDING  TENDERNESS IN MOUTH AND THROAT WITH OR WITHOUT PRESENCE OF ULCERS  *URINARY PROBLEMS  *BOWEL PROBLEMS  UNUSUAL RASH Items with * indicate a potential emergency and should be followed up as soon as possible.  Feel free to call the clinic you have any questions or concerns. The clinic phone number is [phone].

## 2013-06-29 NOTE — Telephone Encounter (Signed)
Gave pt appt for lab,md and chemo for june

## 2013-07-01 ENCOUNTER — Ambulatory Visit: Payer: Medicare Other

## 2013-07-06 ENCOUNTER — Ambulatory Visit: Payer: Medicare Other

## 2013-07-06 ENCOUNTER — Other Ambulatory Visit: Payer: Medicare Other

## 2013-07-06 ENCOUNTER — Ambulatory Visit: Payer: Medicare Other | Admitting: Physician Assistant

## 2013-07-08 ENCOUNTER — Ambulatory Visit: Payer: Medicare Other

## 2013-07-08 ENCOUNTER — Other Ambulatory Visit: Payer: Medicare Other

## 2013-07-14 ENCOUNTER — Ambulatory Visit: Payer: Medicare Other

## 2013-07-14 ENCOUNTER — Ambulatory Visit (HOSPITAL_BASED_OUTPATIENT_CLINIC_OR_DEPARTMENT_OTHER): Payer: Medicare Other | Admitting: Internal Medicine

## 2013-07-14 ENCOUNTER — Telehealth: Payer: Self-pay | Admitting: Internal Medicine

## 2013-07-14 ENCOUNTER — Other Ambulatory Visit (HOSPITAL_BASED_OUTPATIENT_CLINIC_OR_DEPARTMENT_OTHER): Payer: Medicare Other

## 2013-07-14 ENCOUNTER — Telehealth: Payer: Self-pay | Admitting: *Deleted

## 2013-07-14 VITALS — BP 140/65 | HR 94 | Temp 98.2°F | Resp 18 | Ht 66.0 in | Wt 225.6 lb

## 2013-07-14 DIAGNOSIS — R059 Cough, unspecified: Secondary | ICD-10-CM | POA: Diagnosis not present

## 2013-07-14 DIAGNOSIS — C184 Malignant neoplasm of transverse colon: Secondary | ICD-10-CM | POA: Diagnosis not present

## 2013-07-14 DIAGNOSIS — R05 Cough: Secondary | ICD-10-CM | POA: Diagnosis not present

## 2013-07-14 DIAGNOSIS — C8190 Hodgkin lymphoma, unspecified, unspecified site: Secondary | ICD-10-CM

## 2013-07-14 DIAGNOSIS — R918 Other nonspecific abnormal finding of lung field: Secondary | ICD-10-CM | POA: Diagnosis not present

## 2013-07-14 DIAGNOSIS — J029 Acute pharyngitis, unspecified: Secondary | ICD-10-CM

## 2013-07-14 DIAGNOSIS — J328 Other chronic sinusitis: Secondary | ICD-10-CM

## 2013-07-14 DIAGNOSIS — C8587 Other specified types of non-Hodgkin lymphoma, spleen: Secondary | ICD-10-CM

## 2013-07-14 DIAGNOSIS — D702 Other drug-induced agranulocytosis: Secondary | ICD-10-CM

## 2013-07-14 LAB — COMPREHENSIVE METABOLIC PANEL (CC13)
ALT: 24 U/L (ref 0–55)
AST: 28 U/L (ref 5–34)
Albumin: 3.4 g/dL — ABNORMAL LOW (ref 3.5–5.0)
Alkaline Phosphatase: 193 U/L — ABNORMAL HIGH (ref 40–150)
Anion Gap: 14 mEq/L — ABNORMAL HIGH (ref 3–11)
BUN: 5.9 mg/dL — AB (ref 7.0–26.0)
CALCIUM: 9.2 mg/dL (ref 8.4–10.4)
CHLORIDE: 106 meq/L (ref 98–109)
CO2: 22 meq/L (ref 22–29)
CREATININE: 0.7 mg/dL (ref 0.6–1.1)
GLUCOSE: 184 mg/dL — AB (ref 70–140)
Potassium: 3.6 mEq/L (ref 3.5–5.1)
Sodium: 142 mEq/L (ref 136–145)
Total Bilirubin: 0.29 mg/dL (ref 0.20–1.20)
Total Protein: 5.7 g/dL — ABNORMAL LOW (ref 6.4–8.3)

## 2013-07-14 LAB — CEA: CEA: 2.3 ng/mL (ref 0.0–5.0)

## 2013-07-14 LAB — LACTATE DEHYDROGENASE (CC13): LDH: 180 U/L (ref 125–245)

## 2013-07-14 LAB — CBC WITH DIFFERENTIAL/PLATELET
BASO%: 2 % (ref 0.0–2.0)
Basophils Absolute: 0 10*3/uL (ref 0.0–0.1)
EOS ABS: 0.2 10*3/uL (ref 0.0–0.5)
EOS%: 12.9 % — ABNORMAL HIGH (ref 0.0–7.0)
HEMATOCRIT: 32.9 % — AB (ref 34.8–46.6)
HGB: 10.7 g/dL — ABNORMAL LOW (ref 11.6–15.9)
LYMPH%: 42.9 % (ref 14.0–49.7)
MCH: 31.3 pg (ref 25.1–34.0)
MCHC: 32.5 g/dL (ref 31.5–36.0)
MCV: 96.2 fL (ref 79.5–101.0)
MONO#: 0.4 10*3/uL (ref 0.1–0.9)
MONO%: 29.9 % — ABNORMAL HIGH (ref 0.0–14.0)
NEUT#: 0.2 10*3/uL — CL (ref 1.5–6.5)
NEUT%: 12.3 % — AB (ref 38.4–76.8)
PLATELETS: 233 10*3/uL (ref 145–400)
RBC: 3.42 10*6/uL — ABNORMAL LOW (ref 3.70–5.45)
RDW: 18.4 % — ABNORMAL HIGH (ref 11.2–14.5)
WBC: 1.5 10*3/uL — ABNORMAL LOW (ref 3.9–10.3)
lymph#: 0.6 10*3/uL — ABNORMAL LOW (ref 0.9–3.3)
nRBC: 0 % (ref 0–0)

## 2013-07-14 MED ORDER — AZITHROMYCIN 250 MG PO TABS: 250.0000 mg | ORAL_TABLET | Freq: Every day | ORAL | Status: DC

## 2013-07-14 MED ORDER — AZITHROMYCIN 250 MG PO TABS: ORAL_TABLET | ORAL | Status: DC

## 2013-07-14 MED ORDER — TBO-FILGRASTIM 480 MCG/0.8ML ~~LOC~~ SOSY
480.0000 ug | PREFILLED_SYRINGE | Freq: Once | SUBCUTANEOUS | Status: AC
  Administered 2013-07-14: 480 ug via SUBCUTANEOUS
  Filled 2013-07-14: qty 0.8

## 2013-07-14 MED ORDER — AZITHROMYCIN 250 MG PO TABS: 500.0000 mg | ORAL_TABLET | Freq: Every day | ORAL | Status: DC

## 2013-07-14 MED ORDER — TBO-FILGRASTIM 480 MCG/0.8ML ~~LOC~~ SOSY: 480.0000 ug | PREFILLED_SYRINGE | Freq: Once | SUBCUTANEOUS | Status: DC

## 2013-07-14 NOTE — Telephone Encounter (Signed)
gve the pt his June 2015 appt calendar along with the avs. Pt aware to pick up another schedule tomorrow. Sent michelle a staff message to add the chemo appts.

## 2013-07-14 NOTE — Progress Notes (Signed)
[place], MD [address] [place] [address]  DIAGNOSIS: Other chronic sinusitis - Plan: DISCONTINUED: azithromycin (ZITHROMAX) tablet 500 mg, DISCONTINUED: azithromycin (ZITHROMAX) tablet 250 mg  Drug induced neutropenia(288.03) - Plan: Tbo-Filgrastim (GRANIX) injection 480 mcg, Tbo-Filgrastim (GRANIX) injection 480 mcg, DISCONTINUED: Tbo-Filgrastim (GRANIX) injection 480 mcg  Hodgkin lymphoma - Plan: CBC with Differential, Basic metabolic panel (Bmet) - [facility], CBC with Differential, Comprehensive metabolic panel (Cmet) - [facility], Lactate dehydrogenase (LDH) - [facility], Sedimentation rate  Chief Complaint  Patient presents with  . Lymphoma   CURRENT THERAPY:  Started AVD (without Bleomycin due to moderate restriction per PFTs) on 06/08/2013.  Cycle 1A was dose-reduced due to elevated liver function.  We will repeat PFTs following 2 cycles of AVD to consider addition of Bleomycin.   INTERVAL HISTORY: Diamond Boyd 73 y.o. 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  She was last seen by me on 06/15/2013.  She is accompanied by her husband Diamond Boyd.     Pt states she has had pink eye in her (R) eye is nearly resolved since starting valtrex and chemotherapy.    She had an appointment with opthamology and they recommended against biopsy as it has improved. She tolerated her first cycle of AVD without Bleo on 06/08/2013 and had delayed count recovery requiring her to not receive AVD on 05/13.  She received neupogen on 05/13 and received cycle #1B on 05/20.  She reports doing well overall with the exception of a sore throat that has become worst.  She denies fevers or night sweats while on chemotherapy.      She denies specifically any pain, difficulty eating, trouble with her bowels, blood in the stool.     MEDICAL HISTORY: Past Medical History  Diagnosis Date  . Colon polyps   . Depression   . Hypothyroidism   . Osteoarthritis     hands  . Peripheral vascular disease   . Degenerative disk disease     spine in center in past   . 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 Cancer     skin cancer- basal cell on arm / colon 2011  . Lymphoma   . Colon cancer     INTERIM HISTORY: has MALIGNANT NEOPLASM OF TRANSVERSE COLON; LYMPHOMA NEC, MLIG, SPLEEN; HYPERCHOLESTEROLEMIA, PURE; DISORDERS OF PHOSPHORUS METABOLISM; Anxiety state, unspecified; DEPRESSION; PERIPHERAL VASCULAR DISEASE; Other chronic sinusitis; ALLERGIC  RHINITIS; GERD; OVERACTIVE BLADDER; MENOPAUSAL SYNDROME; OSTEOARTHRITIS; LEG EDEMA; THROAT PAIN, CHRONIC; SKIN CANCER, HX OF; COLONIC POLYPS, HX OF; Elevated blood pressure; Obesity; Hypothyroid; Varicose veins; Other asplenic status; History of anemia; Screening for lipoid disorders; Other screening mammogram; Routine gynecological examination; Colon cancer screening; Skin lesion of face; Thoracic back pain; Left knee pain; Encounter for Medicare annual wellness exam; Cystocele; Herpes zoster; Vaginal pain; Other malaise and fatigue; Acute sinusitis; Left temporal headache; Hyperglycemia; Conjunctivitis, acute; Cold sore; Hodgkin lymphoma; Dysuria; and Colon cancer on her problem list.    ALLERGIES:  is allergic to achromycin; allopurinol; astelin; cephalexin; codeine; meloxicam; minocycline; nabumetone; nyquil; penicillins; sulfa antibiotics; zolpidem tartrate; buspar; and ciprofloxacin.  MEDICATIONS: has a current medication list which includes the following prescription(s): acyclovir, calcium carbonate-vitamin d, celecoxib, cholecalciferol, epipen 2-pak, estradiol, fexofenadine, fluoxetine, fluticasone, furosemide,  levothyroxine, lidocaine-prilocaine, lorazepam, omega-3, omeprazole,  ondansetron, prochlorperazine, psyllium, simvastatin, tobramycin-dexamethasone, vitamin e, azithromycin, and dexamethasone, and the following Facility-Administered Medications: sodium chloride.  SURGICAL HISTORY:  Past Surgical History  Procedure Laterality Date  . Cholecystectomy    . Splenectomy      lymphoma  . Breast biopsy  1996  . Plantar fascia release    . Ventral hernia repair  1998  . Tendon release      Right thumb   . Colectomy  8/11  . Vaginal hysterectomy    . Bone marrow biopsy  05/27/13   PROBLEM LIST:  1. Adenocarcinoma of the colon with 2 synchronous primaries dating back to August 2011 when the patient was found to have anemia. Both tumors were associated with negative lymph nodes. One tumor was T2 N0. The other tumor was T3 N0. The T2 N0 stage I tumor was in the sigmoid colon. The T3 N0 stage II tumor was in the transverse colon. The smaller tumor measured 1.5 cm. The larger tumor measured 6.5 cm. Surgery was carried out on 09/28/2009. 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  2. History of marginal zone lymphoma probably dating back to 17. The patient had bone marrow involvement and splenomegaly at that time, also a hemolytic anemia. She underwent a splenectomy on  November 12, 1994. Through the years, the patient has received Rituxan most recently, dating back to May 2006. The patient received fludarabine in late 2002 and early 2003. She may have developed a pneumonitis from the fludarabine in December 2002. The patient has had involvement of the peripheral blood with villous lymphocytes. She appears to be in a state of complete clinical remission at this time. Tumor is CD5 and CD20 positive, also CD11c and FMC7 positive.  3. 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  4. History of lupus anticoagulant dating back to the 1990s.  5. History of iron deficiency anemia for which the patient received IV Feraheme in  January 2012. Presumably, this occurred as a result of her colon cancer.  6. History of GERD resulting in cough diagnosed around 2010.  7. Dyslipidemia.  8. Hypothyroidism.  9. Irritable bowel syndrome.  10. Goiter seen on PET scan from 09/04/2009.  11. Enlarged left tonsil diagnosed April 2011, currently resolved.  12. History of depression.   REVIEW OF SYSTEMS:   Constitutional: Denies fevers, chills or abnormal weight loss Eyes: Denies blurriness of vision Ears, nose, mouth, throat, and face: Denies mucositis or sore throat Respiratory: Denies cough, dyspnea or wheezes Cardiovascular: Denies palpitation, chest discomfort or lower extremity swelling Gastrointestinal:  Denies nausea, heartburn or change in bowel habits Skin: Denies abnormal skin rashes Lymphatics: Denies new lymphadenopathy or easy bruising Neurological:Denies numbness, tingling or new weaknesses Behavioral/Psych: Mood is stable, no new changes  All other systems were reviewed with the patient and are negative.  PHYSICAL EXAMINATION: ECOG PERFORMANCE STATUS: 1 - Symptomatic but completely ambulatory  Blood pressure 140/65, pulse 94, temperature 98.2 F (36.8 C), temperature source Oral, resp. rate 18, height _0  (1.676 m), weight 225 lb 9.6 oz (102.331 kg), last menstrual period 02/10/1970.  GENERAL:alert, mildly distressed but comfortable; moderately obese. Non-toxic appearing SKIN: skin color, texture, turgor are normal, no rashes or significant lesions EYES: normal, Conjunctiva are pink, sclera clear; R eye slightly injected in the medial eyelid portion.  OROPHARYNX:no exudate, no erythema and lips, buccal mucosa, and tongue normal  NECK: supple, thyroid normal size,  non-tender, without nodularity.  Left neck adenopathy.   LYMPH:  no palpable lymphadenopathy in the cervical, axillary or supraclavicular LUNGS: clear to auscultation and percussion with normal breathing effort HEART: regular rate & rhythm and no  murmurs and no lower extremity edema ABDOMEN:abdomen soft, non-tender and normal bowel sounds; multiple surgical scars well healed.   Musculoskeletal:no cyanosis of digits and no clubbing  NEURO: alert & oriented x 3 with fluent speech, no focal motor/sensory deficits  Labs:  Lab Results  Component Value Date   WBC 1.5* 07/14/2013   HGB 10.7* 07/14/2013   HCT 32.9* 07/14/2013   MCV 96.2 07/14/2013   PLT 233 07/14/2013   NEUTROABS 0.2* 07/14/2013      Chemistry      Component Value Date/Time   NA 142 07/14/2013 1010   NA 131* 06/07/2013 2300   K 3.6 07/14/2013 1010   K 4.1 06/07/2013 2300   CL 92* 06/07/2013 2300   CL 105 07/14/2012 0939   CO2 22 07/14/2013 1010   CO2 21 06/07/2013 2300   BUN 5.9* 07/14/2013 1010   BUN 17 06/07/2013 2300   CREATININE 0.7 07/14/2013 1010   CREATININE 0.74 06/07/2013 2300      Component Value Date/Time   CALCIUM 9.2 07/14/2013 1010   CALCIUM 9.3 06/07/2013 2300   CALCIUM 9.3 08/09/2009 0000   ALKPHOS 193* 07/14/2013 1010   ALKPHOS 529* 06/07/2013 2358   AST 28 07/14/2013 1010   AST 121* 06/07/2013 2358   ALT 24 07/14/2013 1010   ALT 46* 06/07/2013 2358   BILITOT 0.29 07/14/2013 1010   BILITOT 2.5* 06/07/2013 2358     IMAGING STUDIES:  1. Digital screening mammogram from 08/05/2010 was negative.  2. Chest x-Diamond Boyd, 2 view, from 12/09/2010 was negative.  3. Digital screening mammogram on 08/20/2011 showed no specific evidence of malignancy  4. Chest x-Diamond Boyd, 2 view, from 11/19/2011 showed atherosclerotic calcifications within the arch of the aorta. There was evidence of a prior cholecystectomy and splenectomy, otherwise chest x-Diamond Boyd was negative.  5. CT scan of abdomen and pelvis with IV contrast obtained on 01/21/2012 showed no clear evidence of colon cancer metastasis. A retrocrural lymph node had increased in size but periportal adenopathy and periaortic adenopathy was stable to decreased. The retrocrural lymph node refer to measures 8 mm on image 12, increased from 5 mm on a prior  study.  6. Chest x-Diamond Boyd, 2 view, from 06/01/2012 showed no acute abnormalities. There was borderline enlargement of the cardiac silhouette.  7. Chest x-Diamond Boyd, 2 view, from 03/09/2013, showed new pulmonary nodules in the right lung. Cannot rule out  metastatic disease. Correlation with CT recommended. (reviewed by me) 8. CT scan of abdomen and pelvis with IV contrast on 03/09/2013 showed small right lower lobe pulmonary nodules measuring up to 8 mm, new, suspicious for metastases. Small right juxtadiaphragmatic, upper abdominal, retroperitoneal, and left pelvic lymph nodes, stable versus mildly increased. Prior splenectomy, cholecystectomy, and right hemicolectomy. PET-CT is suggested for further evaluation.  (reviewed by me) 9. PET/CT on 03/21/2013 revealed intensely hypermetabolic nodule within the left lobe of thyroid gland. Concern for thyroid carcinoma. 2. Two intensely hypermetabolic right lower lobe pulmonary nodules. Differential includes thyroid cancer metastasis or colon cancer metastasis. 3. Intense uptake within the right sacrum is concerning metastasis. Milder abnormal uptake within the T9 vertebral body is indeterminate but concerning for metastasis.  PROCEDURES:  Colonoscopy was carried out by Dr. Erskine Emery on 10/10/2010.  IMAGING: 06/08/2013  CT CHEST WITH CONTRAST TECHNIQUE: Multidetector  CT imaging of the chest was performed during intravenous contrast administration. CONTRAST: 22m OMNIPAQUE IOHEXOL 300 MG/ML SOLN COMPARISON: DG CHEST 2 VIEW dated 06/07/2013; CT ANGIO CHEST W/CM  &/OR WO/CM dated 04/23/2013 FINDINGS: At lung window settings there are multiple lobulated soft tissue density pulmonary parenchymal masses. The largest lies in the right lower lobe posteriorly and measures 2.1 cm in diameter. This has increased from 1.1 cm on the CT scan of April 23, 2013. There is no alveolar infiltrate. There is no pleural effusion. The cardiac chambers are normal in size. There are coronary  artery calcifications. The caliber of the thoracic aorta is normal. There are no bulky mediastinal or hilar or axillary lymph nodes. There is no pleural nor pericardial effusion. There is a right sided epicardial lymph node measuring 8 mm in short axis. The caliber of the thoracic aorta is normal. A Port-A-Cath appliance is in place with the tip of the catheter lying in the mid SVC. The left thyroid  lobe is enlarged where visualized. There are degenerative disc changes of the thoracic spine but no  evidence of a compression fracture. The sternum and observed portions of the ribs appear normal.  Within the upper abdomen the observed portions of the liver exhibit no acute abnormalities.  IMPRESSION: 1. There are bilateral pulmonary parenchymal masses which have increased in size since the CT scan of April 23, 2013. There is no alveolar infiltrate. 2. There is no pleural nor pericardial effusion. 3. No bulky mediastinal or hilar lymph nodes are demonstrated.  03/21/2013 PET CT NUCLEAR MEDICINE PET SKULL BASE TO THIGH FASTING BLOOD GLUCOSE: Value: 96 mg/dl  TECHNIQUE: 12.1 mCi F-18 FDG was injected intravenously. CT data was obtained and used for attenuation correction and anatomic localization only. (This was not acquired as a diagnostic CT examination.) Additional exam technical data entered on technologist worksheet. COMPARISON: NM PET IMAGE INITIAL (PI) SKULL BASE TO THIGH dated 09/04/2009; CT ABD/PELVIS W CM dated 03/09/2013 FINDINGS: NECK There is intense hypermetabolic focus within the medial aspect of  the left lobe of thyroid gland recent with SUV max 50. This correlates to low-density lesion measuring approximately 18 mm (image 45. CHEST There are two hypermetabolic nodules in the right lower lobe. The more superior nodule measures 18 mm (image 67) with SUV max 13.3. The more inferior nodule measures 8 mm (image 76) with SUV max 2.7. No hypermetabolic mediastinal lymph nodes. ABDOMEN/PELVIS No abnormal  hypermetabolic activity within the liver, pancreas, adrenal glands, or spleen. No hypermetabolic lymph nodes in the abdomen or pelvis. SKELETON There is a lesion in the T9 vertebral body with SUV max 2.7 with no clear lesion on the CT portion. There is intense uptake within the right and central sacrum (image 146) with SUV max 10.4. This is also difficult to see on the CT portion. No focal hypermetabolic activity to suggest skeletal metastasis. IMPRESSION: 1. Intensely hypermetabolic nodule within the left lobe of thyroid gland. Concern for thyroid carcinoma. 2. Two intensely hypermetabolic right lower lobe pulmonary nodules. Differential includes thyroid cancer metastasis or colon cancer metastasis. 3. Intense uptake within the right sacrum is concerning metastasis. Milder abnormal uptake within the T9 vertebral body is indeterminate but concerning for metastasis.  PATHOLOGY:  Lung, needle/core biopsy(ies), RLL sup seg nodule pet+ - MALIGNANT LYMPHORETICULAR NEOPLASM, MOST CONSISTENT WITH CLASSICAL HODGKIN LYMPHOMA. - SEE COMMENT. Diagnosis Note The core biopsies reveal a diffuse lymphohistiocytic infiltrate. There are scattered Hodgkin Reed-Sternberg like cells with prominent nuclei. There are rare lacunar like cells.  There are no significant eosinophils or plasma cells seen in the background. Immunohistochemistry reveals scattered CD30 positive cells with golgi staining. The cells are also positive for CD15 and PAX-5. CD45 interpretation is hampered by diffuse background T-cells. CD3 and CD43 highlight abundant small T-cells. Only scattered B-cells are seen with CD79a and CD20. EBV in situ (EBER) is negative. CDX-2, cytokeratin 7 and cytokeratin 20 are negative for carcinoma. Overall, these findings are consistent with extranodal pulmonary involvement by classical Hodgkin lymphoma. The case was sent to [facility] for consultation (report [id number]), who agrees with the above  interpretation. Dr. Juliann Mule was paged on 05/19/2013.  05/26/2013  Bone Marrow, Aspirate,Biopsy, and Clot, right iliac - HYPERCELLULAR BONE MARROW WITH ATYPICAL LYMPHOHISTIOCYTIC PROLIFERATION. - TRILINEAGE HEMATOPOIESIS.- SEE COMMENT.PERIPHERAL BLOOD:- NORMOCYTIC-NORMOCHROMIC ANEMIA. - MONOCYTOSIS.Diagnosis Note The aspirate material is extremely limited for morphologic evaluation. However, the core biopsy shows numerous variably sized and ill-defined interstitial and paratrabecular atypical lymphohistiocytic aggregates mostly composed of small lymphocytes and histiocytes in addition to scattering of larger CD30 positive lymphoid appearing cells. The histologic and phenotypic features are similar to previously diagnosed classical Hodgkin's lymphoma ([id number]) and consistent with marrow involvement by the same disease process. (BNS:ecj/gt 05/27/2013)BASSAM SMIR MD Pathologist, Electronic Signature (Case signed 05/31/2013)  ASSESSMENT: [name] 73 y.o. female with a history of Other chronic sinusitis - Plan: DISCONTINUED: azithromycin (ZITHROMAX) tablet 500 mg, DISCONTINUED: azithromycin (ZITHROMAX) tablet 250 mg  Drug induced neutropenia(288.03) - Plan: Tbo-Filgrastim (GRANIX) injection 480 mcg, Tbo-Filgrastim (GRANIX) injection 480 mcg, DISCONTINUED: Tbo-Filgrastim (GRANIX) injection 480 mcg  Hodgkin lymphoma - Plan: CBC with Differential, Basic metabolic panel (Bmet) - [facility], CBC with Differential, Comprehensive metabolic panel (Cmet) - [facility], Lactate dehydrogenase (LDH) - [facility], Sedimentation rate now with Hodgkins lymphoma with B symptoms including (Fevers, night sweats and weight lost).   PLAN:  1. H.o of Marginal zone lymphoma now with multiple lung nodules consistent with Hodgkin's lymphoma (Stage IV with bone marrow involvement) with B symptoms.   --We had an extensive discussion and review of her pulmonary pathology and final report conclusive for classical hodgkin's disease with bone  marrow invovlement.  She reported fevers night sweats which would be considered a constitutional B symptoms. Her ESR is 84 (high).  Based on advanced lymphoma, she received a bone marrow biopsy which demonstrated involvement of Hodgkin's lymphoma.  Her echo also was obtained demonstrating a normal EF.  She received a R port-a-cath.   Her PFTs  demonstrated a moderate restriction (likely secondary to presence of the disease).   --We started chemotherapy with AVD x 6 cycles (category 2A) q28 days (doxorubicin, vinblastine, and dacarbazine) (Duggan DB, J[employer], 2003).  Based on moderate restriction component of her PFTs, we will hold bleomycin.   We will repeat PFTs status post 2 cycles of chemotherapy.   --The risks of AVD was provided as a handout and the patient will attend a chemotherapy teaching. These risks include but are not limited to bone marrow suppression which can result in life-threatening infections, hair loss, GI disturbances, fatigue, nausea/vomiting. She understood the benefits and risk and chose to proceed. We will HOLD chemotherapy  Today due to her neutropenia.  We will resume in one week as follows:   Day 1, Cycle 2A 07/21/2013  Doxorubicin (Adriamycin) 25 mg/m2  Vinblastine 6 mg/m2  Dacarbazine 375 mg/m2   -She will have Day1, Cycle 2B on 06/25.  We will give neulasta on Day #2 of each cycle due to delayed count recovery. We will obtain  an restaging PET-CT following Cycle #2 (NCCN Guidelines, Version 2.2014, HODG-9) to assess response and further treatment based on [place] criteria for response.   --Her LDH which was elevated has now normalized. Her creatinine is normal. Her calcium and serum potassium is normal. She has small to medium tumor burden but low risk for tumor lysis syndrome Nadara Mustard [place], et al, the Tumor Lysis Syndrome. NEJM, 2011; 469:5072) and will therefore be hydrated aggressively with close monitoring. -She was given anti-emetics and in addition we continue acyclovir 500  mg bid prophylaxis.   She has a listed allergy to allopurinol.   2. Chemotherapy-induced neutropenia. --We will give neupogen on today and on tomorrow (6/5).  We will give neulasta shot on day# 2 of each cycle.   3. Colon cancer now with multiple lung nodules in RLL.   --Elowen remains clinically stable. Her last colonoscopy was on  10/10/2010 by Dr. Deatra Ina.  We will make Dr. Deatra Ina of aware. CEA is stable at 1.9.     4. Thyroid nodule.    --Her PET/CT was also concerning for thyroid carcinoma. TSH on 01/04/2013 was 0.51.  Biopsy was negative for malignancy.   5. Right eye irritation, nearly resolved -- She is being followed by dermatology and opthamology.  She reports substantial improvement.   6. Sore throat without cough. --Concerning for pharnyngitis.  We will start azithromycin with a Z-pack given #2.    7. Follow-up.  --We will plan to see Stephanine again in 3 weeks for a brief symptom visit and for consideration of AVD Cycle 2b.   All questions were answered. The patient knows to call the clinic with any problems, questions or concerns. We can certainly see the patient much sooner if necessary.  I spent 15 minutes counseling the patient face to face. The total time spent in the appointment was 25 minutes.    Concha [place], MD 07/14/2013 2:06 PM

## 2013-07-14 NOTE — Telephone Encounter (Signed)
Per staff message and POF I have scheduled appts.  JMW  

## 2013-07-14 NOTE — Patient Instructions (Signed)
Azithromycin tablets What is this medicine? AZITHROMYCIN (az ith roe MYE sin) is a macrolide antibiotic. It is used to treat or prevent certain kinds of bacterial infections. It will not work for colds, flu, or other viral infections. This medicine may be used for other purposes; ask your health care provider or pharmacist if you have questions. COMMON BRAND NAME(S): Zithromax Tri-Pak, Zithromax Z-Pak, Zithromax What should I tell my health care provider before I take this medicine? They need to know if you have any of these conditions: -kidney disease -liver disease -irregular heartbeat or heart disease -an unusual or allergic reaction to azithromycin, erythromycin, other macrolide antibiotics, foods, dyes, or preservatives -pregnant or trying to get pregnant -breast-feeding How should I use this medicine? Take this medicine by mouth with a full glass of water. Follow the directions on the prescription label. The tablets can be taken with food or on an empty stomach. If the medicine upsets your stomach, take it with food. Take your medicine at regular intervals. Do not take your medicine more often than directed. Take all of your medicine as directed even if you think your are better. Do not skip doses or stop your medicine early. Talk to your pediatrician regarding the use of this medicine in children. Special care may be needed. Overdosage: If you think you have taken too much of this medicine contact a poison control center or emergency room at once. NOTE: This medicine is only for you. Do not share this medicine with others. What if I miss a dose? If you miss a dose, take it as soon as you can. If it is almost time for your next dose, take only that dose. Do not take double or extra doses. What may interact with this medicine? Do not take this medicine with any of the following medications: -lincomycin This medicine may also interact with the following  medications: -amiodarone -antacids -cyclosporine -digoxin -magnesium -nelfinavir -phenytoin -warfarin This list may not describe all possible interactions. Give your health care provider a list of all the medicines, herbs, non-prescription drugs, or dietary supplements you use. Also tell them if you smoke, drink alcohol, or use illegal drugs. Some items may interact with your medicine. What should I watch for while using this medicine? Tell your doctor or health care professional if your symptoms do not improve. Do not treat diarrhea with over the counter products. Contact your doctor if you have diarrhea that lasts more than 2 days or if it is severe and watery. This medicine can make you more sensitive to the sun. Keep out of the sun. If you cannot avoid being in the sun, wear protective clothing and use sunscreen. Do not use sun lamps or tanning beds/booths. What side effects may I notice from receiving this medicine? Side effects that you should report to your doctor or health care professional as soon as possible: -allergic reactions like skin rash, itching or hives, swelling of the face, lips, or tongue -confusion, nightmares or hallucinations -dark urine -difficulty breathing -hearing loss -irregular heartbeat or chest pain -pain or difficulty passing urine -redness, blistering, peeling or loosening of the skin, including inside the mouth -white patches or sores in the mouth -yellowing of the eyes or skin Side effects that usually do not require medical attention (report to your doctor or health care professional if they continue or are bothersome): -diarrhea -dizziness, drowsiness -headache -stomach upset or vomiting -tooth discoloration -vaginal irritation This list may not describe all possible side effects. Call your doctor  for medical advice about side effects. You may report side effects to FDA at 1-800-FDA-1088. Where should I keep my medicine? Keep out of the reach  of children. Store at room temperature between 15 and 30 degrees C (59 and 86 degrees F). Throw away any unused medicine after the expiration date. NOTE: This sheet is a summary. It may not cover all possible information. If you have questions about this medicine, talk to your doctor, pharmacist, or health care provider.  2014, [employer]vier/Gold Standard. (2012-07-21 15:42:07)

## 2013-07-15 ENCOUNTER — Ambulatory Visit (HOSPITAL_BASED_OUTPATIENT_CLINIC_OR_DEPARTMENT_OTHER): Payer: Medicare Other

## 2013-07-15 VITALS — BP 134/59 | HR 91 | Temp 98.5°F

## 2013-07-15 DIAGNOSIS — D702 Other drug-induced agranulocytosis: Secondary | ICD-10-CM | POA: Diagnosis not present

## 2013-07-15 DIAGNOSIS — C8190 Hodgkin lymphoma, unspecified, unspecified site: Secondary | ICD-10-CM

## 2013-07-15 MED ORDER — TBO-FILGRASTIM 480 MCG/0.8ML ~~LOC~~ SOSY
480.0000 ug | PREFILLED_SYRINGE | Freq: Once | SUBCUTANEOUS | Status: AC
  Administered 2013-07-15: 480 ug via SUBCUTANEOUS
  Filled 2013-07-15: qty 0.8

## 2013-07-18 ENCOUNTER — Encounter: Payer: Self-pay | Admitting: Genetic Counselor

## 2013-07-18 DIAGNOSIS — C184 Malignant neoplasm of transverse colon: Secondary | ICD-10-CM

## 2013-07-18 NOTE — Progress Notes (Signed)
HPI:  Diamond Boyd was previously seen in the [facility] due to a personal and family history of cancer and concerns regarding a hereditary predisposition to cancer. Please refer to our prior cancer genetics clinic note for more information regarding Diamond Boyd's medical, social and family histories, and our assessment and recommendations, at the time. Diamond Boyd recent genetic test results were disclosed to her, as were recommendations warranted by these results. These results and recommendations are discussed in more detail below.  GENETIC TEST RESULTS: At the time of Diamond Boyd's visit, we recommended she pursue genetic testing of the [employer] gene panel. This test, which included sequencing and deletion/duplication analysis of the genes listed on the test report, was performed at [facility]. Genetic testing was normal, and did not reveal a mutation in these genes. A complete list of all genes tested is located on the test report scanned into EPIC.    We discussed with Diamond Boyd that since the current genetic testing is not perfect, it is possible there may be a gene mutation in one of these genes that current testing cannot detect, but that chance is small.  We also discussed, that it is possible that another gene that has not yet been discovered, or that we have not yet tested, is responsible for the cancer diagnoses in the family, and it is, therefore, important to remain in touch with cancer genetics in the future so that we can continue to offer Diamond Boyd the most up to date genetic testing.   CANCER SCREENING RECOMMENDATIONS: This result is generally reassuring and suggests that Diamond Boyd's cancer was most likely not due to an inherited predisposition associated with one of these genes.  Most cancers happen by chance and this negative test, along with details of her family history, suggests that her cancer falls into this category.  We,  therefore, recommended she continue to follow the cancer management and screening guidelines provided by her oncology and primary providers.   RECOMMENDATIONS FOR FAMILY MEMBERS:  Individuals in this family might be at some increased risk of developing cancer, over the general population risk, simply due to the family history of cancer.  We recommended women in this family have a yearly mammogram beginning at age 9, an an annual clinical breast exam, and perform monthly breast self-exams. Women in this family should also have a gynecological exam as recommended by their primary provider. All family members should have a colonoscopy by age 102.  FOLLOW-UP: Lastly, we discussed with Diamond Boyd that cancer genetics is a rapidly advancing field and it is possible that new genetic tests will be appropriate for her and/or her family members in the future. We encouraged her to remain in contact with cancer genetics on an annual basis so we can update her personal and family histories and let her know of advances in cancer genetics that may benefit this family.   Our contact number was provided. Diamond Boyd questions were answered to her satisfaction, and she knows she is welcome to call us at anytime with additional questions or concerns. This patient was discussed with Dr. Benay Spice who agrees with the above.   Catherine A. Fine, MS, CGC Certified Genetic Counseor phone: [phone] [email or web address]

## 2013-07-21 ENCOUNTER — Ambulatory Visit (HOSPITAL_BASED_OUTPATIENT_CLINIC_OR_DEPARTMENT_OTHER): Payer: Medicare Other

## 2013-07-21 ENCOUNTER — Other Ambulatory Visit: Payer: Self-pay | Admitting: Oncology

## 2013-07-21 ENCOUNTER — Other Ambulatory Visit: Payer: Self-pay | Admitting: Medical Oncology

## 2013-07-21 ENCOUNTER — Other Ambulatory Visit: Payer: Self-pay | Admitting: Internal Medicine

## 2013-07-21 VITALS — BP 169/70 | HR 98 | Temp 98.4°F | Resp 20

## 2013-07-21 DIAGNOSIS — C8190 Hodgkin lymphoma, unspecified, unspecified site: Secondary | ICD-10-CM

## 2013-07-21 DIAGNOSIS — Z5111 Encounter for antineoplastic chemotherapy: Secondary | ICD-10-CM | POA: Diagnosis not present

## 2013-07-21 DIAGNOSIS — Z452 Encounter for adjustment and management of vascular access device: Secondary | ICD-10-CM | POA: Diagnosis not present

## 2013-07-21 DIAGNOSIS — Z95828 Presence of other vascular implants and grafts: Secondary | ICD-10-CM

## 2013-07-21 LAB — BASIC METABOLIC PANEL (CC13)
Anion Gap: 11 mEq/L (ref 3–11)
BUN: 10.2 mg/dL (ref 7.0–26.0)
CHLORIDE: 104 meq/L (ref 98–109)
CO2: 27 meq/L (ref 22–29)
Calcium: 9.6 mg/dL (ref 8.4–10.4)
Creatinine: 0.8 mg/dL (ref 0.6–1.1)
Glucose: 241 mg/dl — ABNORMAL HIGH (ref 70–140)
Potassium: 3.6 mEq/L (ref 3.5–5.1)
Sodium: 143 mEq/L (ref 136–145)

## 2013-07-21 LAB — CBC WITH DIFFERENTIAL/PLATELET
BASO%: 1.3 % (ref 0.0–2.0)
Basophils Absolute: 0.1 10*3/uL (ref 0.0–0.1)
EOS ABS: 0.3 10*3/uL (ref 0.0–0.5)
EOS%: 4.6 % (ref 0.0–7.0)
HEMATOCRIT: 37.7 % (ref 34.8–46.6)
HGB: 12.2 g/dL (ref 11.6–15.9)
LYMPH#: 2.2 10*3/uL (ref 0.9–3.3)
LYMPH%: 32.2 % (ref 14.0–49.7)
MCH: 31.2 pg (ref 25.1–34.0)
MCHC: 32.4 g/dL (ref 31.5–36.0)
MCV: 96.4 fL (ref 79.5–101.0)
MONO#: 1.8 10*3/uL — AB (ref 0.1–0.9)
MONO%: 26.9 % — ABNORMAL HIGH (ref 0.0–14.0)
NEUT%: 35 % — AB (ref 38.4–76.8)
NEUTROS ABS: 2.4 10*3/uL (ref 1.5–6.5)
Platelets: 239 10*3/uL (ref 145–400)
RBC: 3.91 10*6/uL (ref 3.70–5.45)
RDW: 18.4 % — ABNORMAL HIGH (ref 11.2–14.5)
WBC: 6.8 10*3/uL (ref 3.9–10.3)
nRBC: 2 % — ABNORMAL HIGH (ref 0–0)

## 2013-07-21 LAB — SEDIMENTATION RATE: SED RATE: 17 mm/h (ref 0–22)

## 2013-07-21 MED ORDER — SODIUM CHLORIDE 0.9 % IJ SOLN
10.0000 mL | INTRAMUSCULAR | Status: DC | PRN
  Administered 2013-07-21: 10 mL
  Filled 2013-07-21: qty 10

## 2013-07-21 MED ORDER — DEXAMETHASONE SODIUM PHOSPHATE 20 MG/5ML IJ SOLN
INTRAMUSCULAR | Status: AC
  Filled 2013-07-21: qty 5

## 2013-07-21 MED ORDER — SODIUM CHLORIDE 0.9 % IV SOLN
Freq: Once | INTRAVENOUS | Status: AC
  Administered 2013-07-21: 11:00:00 via INTRAVENOUS

## 2013-07-21 MED ORDER — ONDANSETRON 16 MG/50ML IVPB (CHCC)
16.0000 mg | Freq: Once | INTRAVENOUS | Status: AC
  Administered 2013-07-21: 16 mg via INTRAVENOUS

## 2013-07-21 MED ORDER — HEPARIN SOD (PORK) LOCK FLUSH 100 UNIT/ML IV SOLN
500.0000 [IU] | Freq: Once | INTRAVENOUS | Status: AC | PRN
  Administered 2013-07-21: 500 [IU]
  Filled 2013-07-21: qty 5

## 2013-07-21 MED ORDER — VINBLASTINE SULFATE CHEMO INJECTION 1 MG/ML
6.0000 mg/m2 | Freq: Once | INTRAVENOUS | Status: AC
  Administered 2013-07-21: 13 mg via INTRAVENOUS
  Filled 2013-07-21: qty 13

## 2013-07-21 MED ORDER — ONDANSETRON 16 MG/50ML IVPB (CHCC)
INTRAVENOUS | Status: AC
  Filled 2013-07-21: qty 16

## 2013-07-21 MED ORDER — DACARBAZINE 200 MG IV SOLR
800.0000 mg | Freq: Once | INTRAVENOUS | Status: AC
  Administered 2013-07-21: 800 mg via INTRAVENOUS
  Filled 2013-07-21: qty 40

## 2013-07-21 MED ORDER — ALTEPLASE 2 MG IJ SOLR
2.0000 mg | Freq: Once | INTRAMUSCULAR | Status: AC
  Administered 2013-07-21: 2 mg
  Filled 2013-07-21: qty 2

## 2013-07-21 MED ORDER — DOXORUBICIN HCL CHEMO IV INJECTION 2 MG/ML
25.0000 mg/m2 | Freq: Once | INTRAVENOUS | Status: AC
  Administered 2013-07-21: 54 mg via INTRAVENOUS
  Filled 2013-07-21: qty 27

## 2013-07-21 MED ORDER — DEXAMETHASONE SODIUM PHOSPHATE 20 MG/5ML IJ SOLN
20.0000 mg | Freq: Once | INTRAMUSCULAR | Status: AC
  Administered 2013-07-21: 20 mg via INTRAVENOUS

## 2013-07-21 NOTE — Patient Instructions (Signed)
[facility] Discharge Instructions for Patients Receiving Chemotherapy  Today you received the following chemotherapy agents: Adriamycin, Vinblastine, Dacarbazine   To help prevent nausea and vomiting after your treatment, we encourage you to take your nausea medication as prescribed.    If you develop nausea and vomiting that is not controlled by your nausea medication, call the clinic.   BELOW ARE SYMPTOMS THAT SHOULD BE REPORTED IMMEDIATELY:  *FEVER GREATER THAN 100.5 F  *CHILLS WITH OR WITHOUT FEVER  NAUSEA AND VOMITING THAT IS NOT CONTROLLED WITH YOUR NAUSEA MEDICATION  *UNUSUAL SHORTNESS OF BREATH  *UNUSUAL BRUISING OR BLEEDING  TENDERNESS IN MOUTH AND THROAT WITH OR WITHOUT PRESENCE OF ULCERS  *URINARY PROBLEMS  *BOWEL PROBLEMS  UNUSUAL RASH Items with * indicate a potential emergency and should be followed up as soon as possible.  Feel free to call the clinic you have any questions or concerns. The clinic phone number is [phone].

## 2013-07-22 ENCOUNTER — Ambulatory Visit (HOSPITAL_BASED_OUTPATIENT_CLINIC_OR_DEPARTMENT_OTHER): Payer: Medicare Other

## 2013-07-22 VITALS — BP 133/59 | HR 82 | Temp 98.7°F

## 2013-07-22 DIAGNOSIS — Z5189 Encounter for other specified aftercare: Secondary | ICD-10-CM

## 2013-07-22 DIAGNOSIS — C8190 Hodgkin lymphoma, unspecified, unspecified site: Secondary | ICD-10-CM | POA: Diagnosis not present

## 2013-07-22 MED ORDER — PEGFILGRASTIM INJECTION 6 MG/0.6ML
6.0000 mg | Freq: Once | SUBCUTANEOUS | Status: AC
  Administered 2013-07-22: 6 mg via SUBCUTANEOUS
  Filled 2013-07-22: qty 0.6

## 2013-07-25 ENCOUNTER — Telehealth: Payer: Self-pay | Admitting: Medical Oncology

## 2013-07-25 NOTE — Telephone Encounter (Signed)
Pt called and states that she got neulasta last week for the first time. She woke up Saturday with an ear ache, headache and sore throat. She had some pain across her shoulder blades which she took tylenol. She wanted to know could the ?respiratory symptoms come from the neulasta. I asked if she is short of breath or having any swelling and she stated no. I told her the shoulder pain is probably from the neulasta but not sure about the other symptoms. She is going to call her primary care MD and go see her. She states it could be a virus or a cold coming on.

## 2013-07-26 ENCOUNTER — Encounter: Payer: Self-pay | Admitting: Family Medicine

## 2013-07-26 ENCOUNTER — Ambulatory Visit (INDEPENDENT_AMBULATORY_CARE_PROVIDER_SITE_OTHER): Payer: Medicare Other | Admitting: Family Medicine

## 2013-07-26 ENCOUNTER — Telehealth: Payer: Self-pay | Admitting: Internal Medicine

## 2013-07-26 VITALS — BP 124/64 | HR 104 | Temp 99.4°F | Ht 66.0 in | Wt 221.0 lb

## 2013-07-26 DIAGNOSIS — R509 Fever, unspecified: Secondary | ICD-10-CM | POA: Insufficient documentation

## 2013-07-26 DIAGNOSIS — J029 Acute pharyngitis, unspecified: Secondary | ICD-10-CM | POA: Diagnosis not present

## 2013-07-26 DIAGNOSIS — J019 Acute sinusitis, unspecified: Secondary | ICD-10-CM | POA: Diagnosis not present

## 2013-07-26 LAB — CBC WITH DIFFERENTIAL/PLATELET
BASOS ABS: 0 10*3/uL (ref 0.0–0.1)
Basophils Relative: 0.1 % (ref 0.0–3.0)
EOS ABS: 0 10*3/uL (ref 0.0–0.7)
Eosinophils Relative: 0.2 % (ref 0.0–5.0)
HCT: 37.5 % (ref 36.0–46.0)
Hemoglobin: 12.3 g/dL (ref 12.0–15.0)
Lymphocytes Relative: 8.3 % — ABNORMAL LOW (ref 12.0–46.0)
Lymphs Abs: 1.1 10*3/uL (ref 0.7–4.0)
MCHC: 32.7 g/dL (ref 30.0–36.0)
MCV: 96.7 fl (ref 78.0–100.0)
MONO ABS: 0 10*3/uL — AB (ref 0.1–1.0)
Monocytes Relative: 0.4 % — ABNORMAL LOW (ref 3.0–12.0)
NEUTROS PCT: 91 % — AB (ref 43.0–77.0)
Neutro Abs: 11.9 10*3/uL — ABNORMAL HIGH (ref 1.4–7.7)
Platelets: 124 10*3/uL — ABNORMAL LOW (ref 150.0–400.0)
RBC: 3.88 Mil/uL (ref 3.87–5.11)
RDW: 20.2 % — AB (ref 11.5–15.5)
WBC: 13.1 10*3/uL — AB (ref 4.0–10.5)

## 2013-07-26 LAB — POCT RAPID STREP A (OFFICE): Rapid Strep A Screen: NEGATIVE

## 2013-07-26 MED ORDER — OXYCODONE HCL 5 MG PO TABS
5.0000 mg | ORAL_TABLET | Freq: Four times a day (QID) | ORAL | Status: DC | PRN
Start: 2013-07-26 — End: 2013-09-01

## 2013-07-26 MED ORDER — AZITHROMYCIN 250 MG PO TABS: ORAL_TABLET | ORAL | Status: DC

## 2013-07-26 NOTE — Patient Instructions (Signed)
You have symptoms of a sinus infection with low grade fever and sinus pain  I sent zpak to your pharmacy  Call your chemo nurse -and make sure the oncology team is aware of your current symptoms including rash on head/ low grade fever / sinus pain and congestion  I am not sure what pain med would be safe with your chemo  Cbc today to check white count  If symptoms worsen- please go to ER

## 2013-07-26 NOTE — Progress Notes (Signed)
Pre visit review using our clinic review tool, if applicable. No additional management support is needed unless otherwise documented below in the visit note. 

## 2013-07-26 NOTE — Telephone Encounter (Signed)
Patient has a rash on her head since Saturday.  She also reports a low grade fever of 99.4 with symptoms of generalized body aches.  She receive neulasta on 06/12.  She also complained of a sore throat.  The rash involves her head mainly without mucocutaneous involvement.  She was seen by her PCP's office and started on azithromycin.   PLAN: I will start her on oxycodone 5 mg q 6 hours as needed for generalized aches and bone pain likely secondary to neulasta shot.  For the rash, she was counseled to start hydrocortisone cream and benadryl prn for pruritus.  If rash progresses, we will start oral steroids.  Rash likely secondary to chemotherapy i.e., vinblastine and/or dacarbazine.   Patient will pick up prescription later today.

## 2013-07-26 NOTE — Progress Notes (Signed)
Subjective:    Patient ID: [name], female    DOB: 10-05-1940, 73 y.o.   MRN: [id number]  HPI Is in the middle of chemo  Her wbc has been low - chemo has been intermittent  Thinks they are using decadron to bring the wbc up    Lab Results  Component Value Date   WBC 6.8 07/21/2013   HGB 12.2 07/21/2013   HCT 37.7 07/21/2013   MCV 96.4 07/21/2013   PLT 239 07/21/2013    oncol- nurse said she thinks she has a virus   Bad congestion  Nosebleed in R nostril recurrent  (? If this is a side eff from chemo)  She is blowing yellow and thick mucous out  Symptoms have been worse since Saturday  Facial pain is all over - head and gums   She lost her hair-some bumps on her scalp in the past few days and it is itchy  Throat is raw with drainage   She is taking tylenol for pain    Results for orders placed in visit on 07/26/13  POCT RAPID STREP A (OFFICE)      Result Value Ref Range   Rapid Strep A Screen Negative  Negative     Is achey all over  Has not taken temp- 99.4 - today  No cough   Patient Active Problem List   Diagnosis Date Noted  . Colon cancer 06/17/2013  . Hodgkin lymphoma 05/20/2013  . Dysuria 05/20/2013  . Conjunctivitis, acute 04/27/2013  . Cold sore 04/27/2013  . Left temporal headache 03/11/2013  . Hyperglycemia 03/11/2013  . Acute sinusitis 02/09/2013  . Other malaise and fatigue 01/04/2013  . Herpes zoster 11/22/2012  . Vaginal pain 11/22/2012  . Cystocele 09/22/2012  . Encounter for Medicare annual wellness exam 09/17/2012  . Left knee pain 09/10/2012  . Thoracic back pain 06/01/2012  . Skin lesion of face 05/17/2012  . Other screening mammogram 07/22/2011  . Routine gynecological examination 07/22/2011  . Colon cancer screening 07/22/2011  . Screening for lipoid disorders 07/17/2011  . History of anemia 05/06/2011  . 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 Hypothyroid 02/24/2011  . Varicose veins 02/24/2011  . Elevated blood pressure  11/20/2010  . Obesity 11/20/2010  . 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 Other chronic sinusitis 07/03/2009  . THROAT PAIN, CHRONIC 07/03/2009  . GERD 12/20/2008  . Anxiety state, unspecified 08/22/2008  . MENOPAUSAL SYNDROME 07/13/2007  . HYPERCHOLESTEROLEMIA, PURE 07/08/2006  . ALLERGIC  RHINITIS 07/08/2006  . OVERACTIVE BLADDER 07/08/2006  . 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 DEPRESSION 06/25/2006  . PERIPHERAL VASCULAR DISEASE 06/25/2006  . OSTEOARTHRITIS 06/25/2006  . LEG EDEMA 06/25/2006  . SKIN CANCER, HX OF 06/25/2006  . COLONIC POLYPS, HX OF 06/25/2006   Past Medical History  Diagnosis Date  . Colon polyps   . Depression   . Hypothyroidism   . Osteoarthritis     hands  . Peripheral vascular disease   . Degenerative disk disease     spine in center in past   . 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 Cancer     skin cancer- basal cell on arm / colon 2011  . Lymphoma   . Colon cancer    Past Surgical History  Procedure Laterality Date  . Cholecystectomy    . Splenectomy      lymphoma  . Breast biopsy  1996  . Plantar fascia release    .  Ventral hernia repair  1998  . Tendon release      Right thumb   . Colectomy  8/11  . Vaginal hysterectomy    . Bone marrow biopsy  05/27/13   History  Substance Use Topics  . Smoking status: Never Smoker   . Smokeless tobacco: Never Used  . Alcohol Use: No   Family History  Problem Relation Age of Onset  . Coronary artery disease Mother   . Alcohol abuse Mother   . Cancer Father     brain  . Diabetes Brother   . Fibromyalgia Daughter     chronic pain   . COPD Daughter   . Colon cancer Neg Hx   . Colon polyps Neg Hx   . Stomach cancer Neg Hx    Allergies  Allergen Reactions  . Achromycin [Tetracycline Hcl]     Pt does not remember reaction  . Allopurinol     REACTION: Unsure of reaction happene years ago  . Astelin [Azelastine Hcl]      Unknown  . Cephalexin     REACTION: unsure of reaction happened yrs ago.  . Codeine Other (See Comments)    REACTION: abd. pain  . Meloxicam Other (See Comments)    REACTION: GI symptoms  . Minocycline Other (See Comments)    Abdominal pain  . Nabumetone     REACTION: reaction not known  . Nyquil [Pseudoeph-Doxylamine-Dm-Apap] Hives  . Penicillins     REACTION: reaction not known  . Sulfa Antibiotics     Gi side eff   . Zolpidem Tartrate     REACTION: feels too drugged  . Buspar [Buspirone Hcl]     Dizziness, and not as effective for anxiety  . Ciprofloxacin Rash   Current Outpatient Prescriptions on File Prior to Visit  Medication Sig Dispense Refill  . azithromycin (ZITHROMAX Z-PAK) 250 MG tablet Take 2 tablets (500 mg) on day # 1, then take one tablet (250 mg) daily for 4 days.  6 each  0  . Calcium Carbonate-Vitamin D (CALCIUM 600+D) 600-400 MG-UNIT per tablet Take 1 tablet by mouth daily.       . celecoxib (CELEBREX) 200 MG capsule Take 200 mg by mouth daily as needed for moderate pain.      . cholecalciferol (VITAMIN D) 1000 UNITS tablet Take 1,000 Units by mouth daily.       Marland Kitchen dexamethasone (DECADRON) 4 MG tablet Take 2 tablets (8 mg total) by mouth 2 (two) times daily with a meal. Start the day after chemotherapy for 3 days.  30 tablet  1  . EPIPEN 2-PAK 0.3 MG/0.3ML SOAJ injection as needed.      Marland Kitchen estradiol (ESTRACE) 2 MG tablet Take 1 mg by mouth daily. Takes 1/2 tablet      . fexofenadine (ALLEGRA) 180 MG tablet Take 180 mg by mouth daily.      Marland Kitchen FLUoxetine (PROZAC) 20 MG capsule Take 20 mg by mouth every morning.       . fluticasone (FLONASE) 50 MCG/ACT nasal spray Place 1 spray into the nose 2 (two) times daily.       . furosemide (LASIX) 20 MG tablet Take 1 tablet (20 mg total) by mouth daily.  30 tablet  0  . levothyroxine (SYNTHROID, LEVOTHROID) 112 MCG tablet Take 112 mcg by mouth daily before breakfast.      . lidocaine-prilocaine (EMLA) cream Apply 1  application topically as needed.  30 g  0  . LORazepam (ATIVAN) 0.5  MG tablet Take 1 tablet (0.5 mg total) by mouth every 6 (six) hours as needed (Nausea or vomiting).  30 tablet  0  . Omega-3 350 MG CAPS Take 1 capsule by mouth daily.      Marland Kitchen omeprazole (PRILOSEC) 40 MG capsule Take 40 mg by mouth 2 (two) times daily.       . ondansetron (ZOFRAN) 8 MG tablet Take 1 tablet (8 mg total) by mouth 2 (two) times daily. Start the day after chemo for 3 days. Then take as needed for nausea or vomiting.  30 tablet  1  . prochlorperazine (COMPAZINE) 10 MG tablet Take 1 tablet (10 mg total) by mouth every 6 (six) hours as needed (Nausea or vomiting).  30 tablet  1  . psyllium (METAMUCIL) 58.6 % packet Take 1-3 packets by mouth daily.       . simvastatin (ZOCOR) 40 MG tablet Take 40 mg by mouth every evening.      . tobramycin-dexamethasone (TOBRADEX) ophthalmic ointment Place 1 application into the right eye 3 (three) times daily.      . vitamin E (VITAMIN E) 400 UNIT capsule Take 400 Units by mouth daily.        Current Facility-Administered Medications on File Prior to Visit  Medication Dose Route Frequency Provider Last Rate Last Dose  . 0.9 %  sodium chloride infusion  500 mL Intravenous Continuous Inda Castle, MD        Review of Systems    Review of Systems  Constitutional: pos for low grade fever , malaise , loss of appetite and exhaustion  Eyes: Negative for pain and visual disturbance.  ENT pos for headache, facial pain , nasal congestion , yellow nasal drainage Respiratory: Negative for wheeze  and shortness of breath.   Cardiovascular: Negative for cp or palpitations    Gastrointestinal: Negative for nausea, diarrhea and constipation.  Genitourinary: Negative for urgency and frequency.  Skin: Negative for pallor and pos for itchy rash on scalp (presumably from chemo) Neurological: Negative for weakness, light-headedness, numbness and headaches.  Hematological: Negative for adenopathy.  Does not bruise/bleed easily.  Psychiatric/Behavioral: Negative for dysphoric mood. The patient is not nervous/anxious.      Objective:   Physical Exam  Constitutional: She appears well-developed and well-nourished. No distress.  Obese and fatigued appearing   HENT:  Head: Normocephalic and atraumatic.  Right Ear: External ear normal.  Left Ear: External ear normal.  Mouth/Throat: Oropharynx is clear and moist. No oropharyngeal exudate.  Nares are injected and congested  Clear rhinorrhea bilat ethmoid/frontal sinus tenderness  Throat clear No mouth ulcers   Eyes: Conjunctivae and EOM are normal. Pupils are equal, round, and reactive to light. Right eye exhibits no discharge. Left eye exhibits no discharge. No scleral icterus.  Neck: Normal range of motion. Neck supple.  Cardiovascular: Normal rate and regular rhythm.   Pulmonary/Chest: Effort normal and breath sounds normal. No respiratory distress. She has no wheezes. She has no rales.  Dry cough  Lymphadenopathy:    She has no cervical adenopathy.  Neurological: She is alert.  Skin: Skin is warm and dry. Rash noted.  Erythematous papules diffusely on scalp  No vesicles   Psychiatric: Her mood appears anxious.  Pt is mildly anxious           Assessment & Plan:   Problem List Items Addressed This Visit     Respiratory   Acute sinusitis     In immunocompromised pt on chemo with  poss neutropenia Stat cbc -will send to oncology  zpack called in  Rash on whole scalp- may be side eff chemo-pt will call oncol today and alert them to fever/ rash as well as sinus symptoms    Relevant Medications      azithromycin (ZITHROMAX) tablet   Other Relevant Orders      CBC with Differential (Completed)     Other   Fever   Relevant Orders      CBC with Differential (Completed)    Other Visit Diagnoses   Sore throat    -  Primary    Relevant Orders       Rapid Strep A (Completed)       CBC with Differential (Completed)

## 2013-07-26 NOTE — Assessment & Plan Note (Signed)
In immunocompromised pt on chemo with poss neutropenia Stat cbc -will send to oncology  zpack called in  Rash on whole scalp- may be side eff chemo-pt will call oncol today and alert them to fever/ rash as well as sinus symptoms

## 2013-08-04 ENCOUNTER — Other Ambulatory Visit (HOSPITAL_BASED_OUTPATIENT_CLINIC_OR_DEPARTMENT_OTHER): Payer: Medicare Other

## 2013-08-04 ENCOUNTER — Ambulatory Visit (HOSPITAL_BASED_OUTPATIENT_CLINIC_OR_DEPARTMENT_OTHER): Payer: Medicare Other | Admitting: Internal Medicine

## 2013-08-04 ENCOUNTER — Encounter: Payer: Self-pay | Admitting: Gastroenterology

## 2013-08-04 ENCOUNTER — Encounter: Payer: Self-pay | Admitting: Internal Medicine

## 2013-08-04 ENCOUNTER — Telehealth: Payer: Self-pay | Admitting: Internal Medicine

## 2013-08-04 ENCOUNTER — Ambulatory Visit (HOSPITAL_BASED_OUTPATIENT_CLINIC_OR_DEPARTMENT_OTHER): Payer: Medicare Other

## 2013-08-04 VITALS — BP 164/62 | HR 89 | Temp 98.3°F | Resp 20 | Ht 66.0 in | Wt 223.5 lb

## 2013-08-04 DIAGNOSIS — D702 Other drug-induced agranulocytosis: Secondary | ICD-10-CM

## 2013-08-04 DIAGNOSIS — E041 Nontoxic single thyroid nodule: Secondary | ICD-10-CM | POA: Diagnosis not present

## 2013-08-04 DIAGNOSIS — Z5111 Encounter for antineoplastic chemotherapy: Secondary | ICD-10-CM

## 2013-08-04 DIAGNOSIS — Z85038 Personal history of other malignant neoplasm of large intestine: Secondary | ICD-10-CM

## 2013-08-04 DIAGNOSIS — C8190 Hodgkin lymphoma, unspecified, unspecified site: Secondary | ICD-10-CM

## 2013-08-04 DIAGNOSIS — C189 Malignant neoplasm of colon, unspecified: Secondary | ICD-10-CM

## 2013-08-04 LAB — CBC WITH DIFFERENTIAL/PLATELET
BASO%: 1.9 % (ref 0.0–2.0)
Basophils Absolute: 0.1 10*3/uL (ref 0.0–0.1)
EOS%: 9.1 % — ABNORMAL HIGH (ref 0.0–7.0)
Eosinophils Absolute: 0.3 10*3/uL (ref 0.0–0.5)
HCT: 35.2 % (ref 34.8–46.6)
HGB: 11.6 g/dL (ref 11.6–15.9)
LYMPH%: 24.7 % (ref 14.0–49.7)
MCH: 31.7 pg (ref 25.1–34.0)
MCHC: 33 g/dL (ref 31.5–36.0)
MCV: 96.2 fL (ref 79.5–101.0)
MONO#: 0.9 10*3/uL (ref 0.1–0.9)
MONO%: 25.9 % — ABNORMAL HIGH (ref 0.0–14.0)
NEUT%: 38.4 % (ref 38.4–76.8)
NEUTROS ABS: 1.4 10*3/uL — AB (ref 1.5–6.5)
Platelets: 396 10*3/uL (ref 145–400)
RBC: 3.66 10*6/uL — AB (ref 3.70–5.45)
RDW: 19.2 % — AB (ref 11.2–14.5)
WBC: 3.5 10*3/uL — AB (ref 3.9–10.3)
lymph#: 0.9 10*3/uL (ref 0.9–3.3)

## 2013-08-04 LAB — COMPREHENSIVE METABOLIC PANEL (CC13)
ALBUMIN: 3.4 g/dL — AB (ref 3.5–5.0)
ALK PHOS: 232 U/L — AB (ref 40–150)
ALT: 26 U/L (ref 0–55)
AST: 35 U/L — ABNORMAL HIGH (ref 5–34)
Anion Gap: 10 mEq/L (ref 3–11)
BUN: 7.9 mg/dL (ref 7.0–26.0)
CO2: 23 mEq/L (ref 22–29)
Calcium: 9.4 mg/dL (ref 8.4–10.4)
Chloride: 107 mEq/L (ref 98–109)
Creatinine: 0.7 mg/dL (ref 0.6–1.1)
GLUCOSE: 197 mg/dL — AB (ref 70–140)
POTASSIUM: 3.5 meq/L (ref 3.5–5.1)
SODIUM: 141 meq/L (ref 136–145)
TOTAL PROTEIN: 5.8 g/dL — AB (ref 6.4–8.3)
Total Bilirubin: 0.22 mg/dL (ref 0.20–1.20)

## 2013-08-04 LAB — LACTATE DEHYDROGENASE (CC13): LDH: 178 U/L (ref 125–245)

## 2013-08-04 MED ORDER — HEPARIN SOD (PORK) LOCK FLUSH 100 UNIT/ML IV SOLN
500.0000 [IU] | Freq: Once | INTRAVENOUS | Status: AC | PRN
  Administered 2013-08-04: 500 [IU]
  Filled 2013-08-04: qty 5

## 2013-08-04 MED ORDER — ONDANSETRON 16 MG/50ML IVPB (CHCC)
INTRAVENOUS | Status: AC
  Filled 2013-08-04: qty 16

## 2013-08-04 MED ORDER — ACYCLOVIR 400 MG PO TABS: 400.0000 mg | ORAL_TABLET | Freq: Two times a day (BID) | ORAL | Status: DC

## 2013-08-04 MED ORDER — VINBLASTINE SULFATE CHEMO INJECTION 1 MG/ML
6.0000 mg/m2 | Freq: Once | INTRAVENOUS | Status: AC
  Administered 2013-08-04: 13 mg via INTRAVENOUS
  Filled 2013-08-04: qty 13

## 2013-08-04 MED ORDER — ONDANSETRON 16 MG/50ML IVPB (CHCC)
16.0000 mg | Freq: Once | INTRAVENOUS | Status: AC
  Administered 2013-08-04: 16 mg via INTRAVENOUS

## 2013-08-04 MED ORDER — SODIUM CHLORIDE 0.9 % IV SOLN
370.0000 mg/m2 | Freq: Once | INTRAVENOUS | Status: AC
  Administered 2013-08-04: 800 mg via INTRAVENOUS
  Filled 2013-08-04: qty 40

## 2013-08-04 MED ORDER — DOXORUBICIN HCL CHEMO IV INJECTION 2 MG/ML
25.0000 mg/m2 | Freq: Once | INTRAVENOUS | Status: AC
  Administered 2013-08-04: 54 mg via INTRAVENOUS
  Filled 2013-08-04: qty 27

## 2013-08-04 MED ORDER — SODIUM CHLORIDE 0.9 % IV SOLN
Freq: Once | INTRAVENOUS | Status: AC
  Administered 2013-08-04: 10:00:00 via INTRAVENOUS

## 2013-08-04 MED ORDER — DEXAMETHASONE SODIUM PHOSPHATE 20 MG/5ML IJ SOLN
20.0000 mg | Freq: Once | INTRAMUSCULAR | Status: AC
  Administered 2013-08-04: 20 mg via INTRAVENOUS

## 2013-08-04 MED ORDER — SODIUM CHLORIDE 0.9 % IJ SOLN
10.0000 mL | INTRAMUSCULAR | Status: DC | PRN
  Administered 2013-08-04: 10 mL
  Filled 2013-08-04: qty 10

## 2013-08-04 MED ORDER — DEXAMETHASONE SODIUM PHOSPHATE 20 MG/5ML IJ SOLN
INTRAMUSCULAR | Status: AC
  Filled 2013-08-04: qty 5

## 2013-08-04 NOTE — Telephone Encounter (Signed)
gv adn printed appt sched and avs for pt for June/July...sed added tx.

## 2013-08-04 NOTE — Progress Notes (Signed)
Per Dr. Juliann Mule okay to treat today with ANC 1.4. Harvie Bridge

## 2013-08-04 NOTE — Progress Notes (Signed)
[place], MD [address] [place] [address]  DIAGNOSIS: Hodgkin lymphoma - Plan: acyclovir (ZOVIRAX) 400 MG tablet, CBC with Differential, Comprehensive metabolic panel (Cmet) - [facility], Lactate dehydrogenase (LDH) - [facility], NM PET Image Restag (PS) Skull Base To Thigh, Pulmonary function test  Colon cancer - Plan: acyclovir (ZOVIRAX) 400 MG tablet, CBC with Differential, Comprehensive metabolic panel (Cmet) - [facility], Lactate dehydrogenase (LDH) - [facility], NM PET Image Restag (PS) Skull Base To Thigh  Chief Complaint  Patient presents with  . Lymphoma   CURRENT THERAPY:  Started AVD (without Bleomycin due to moderate restriction per PFTs) on 06/08/2013.  Cycle 1A was dose-reduced due to elevated liver function.  We will repeat PFTs following 2 cycles of AVD to consider addition of Bleomycin.   INTERVAL HISTORY: Diamond Boyd 73 y.o. 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   She was last seen by me on 07/14/2013.  She is accompanied by her husband Ray.   She tolerated her first cycle of AVD without Bleo on 06/08/2013 and had delayed count recovery requiring her to not receive AVD on 05/13.  She received neupogen on 05/13 and received cycle #1B on 05/20.  She received cycle 2A three weeks ago but had rash on her head last week which has nearly resolved.  She is taking benadryl as needed. She denies fevers or night sweats while on chemotherapy.     She denies specifically any pain, difficulty eating, trouble with her bowels, blood in the stool.     MEDICAL HISTORY: Past Medical History  Diagnosis Date  . Colon polyps   . Depression   . Hypothyroidism   .  Osteoarthritis     hands  . Peripheral vascular disease   . Degenerative disk disease     spine in center in past   . 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 Cancer     skin cancer- basal cell on arm / colon 2011  . Lymphoma   . Colon cancer     08-2009    INTERIM HISTORY: has MALIGNANT NEOPLASM OF TRANSVERSE COLON; LYMPHOMA NEC, MLIG, SPLEEN; HYPERCHOLESTEROLEMIA, PURE; DISORDERS OF PHOSPHORUS METABOLISM; Anxiety state, unspecified; DEPRESSION; PERIPHERAL VASCULAR DISEASE; Other chronic sinusitis; ALLERGIC  RHINITIS; GERD; OVERACTIVE BLADDER; MENOPAUSAL SYNDROME; OSTEOARTHRITIS; LEG EDEMA; THROAT PAIN, CHRONIC; SKIN CANCER, HX OF; COLONIC POLYPS, HX OF; Elevated blood pressure; Obesity; Hypothyroid; Varicose veins; Other asplenic status; History of anemia; Screening for lipoid disorders; Other screening mammogram; Routine gynecological examination; Colon cancer screening; Skin lesion of face; Thoracic back pain; Left knee pain; Encounter for Medicare annual wellness exam; Cystocele; Herpes zoster; Vaginal pain; Other malaise and fatigue; Acute sinusitis; Left temporal headache; Hyperglycemia; Conjunctivitis, acute; Cold sore; Hodgkin lymphoma; Dysuria; Colon cancer; and Fever on her problem list.    ALLERGIES:  is allergic to achromycin; allopurinol; astelin; cephalexin; codeine; meloxicam; minocycline; nabumetone; nyquil; penicillins; sulfa antibiotics; zolpidem tartrate; buspar; and ciprofloxacin.  MEDICATIONS: has a current medication list which includes the following prescription(s): calcium carbonate-vitamin d, celecoxib, cholecalciferol, epipen 2-pak, estradiol, fexofenadine, fluoxetine, fluticasone, furosemide, levothyroxine, lidocaine-prilocaine, lorazepam, omega-3, omeprazole, ondansetron, oxycodone, prochlorperazine, psyllium, simvastatin, tobramycin-dexamethasone, vitamin e, and acyclovir, and the following Facility-Administered Medications: sodium  chloride.  SURGICAL HISTORY:  Past  Surgical History  Procedure Laterality Date  . Cholecystectomy    . Splenectomy      lymphoma  . Breast biopsy  1996  . Plantar fascia release    . Ventral hernia repair  1998  . Tendon release      Right thumb   . Colectomy  8/11  . Vaginal hysterectomy    . Bone marrow biopsy  05/27/13   PROBLEM LIST:  1. Adenocarcinoma of the colon with 2 synchronous primaries dating back to August 2011 when the patient was found to have anemia. Both tumors were associated with negative lymph nodes. One tumor was T2 N0. The other tumor was T3 N0. The T2 N0 stage I tumor was in the sigmoid colon. The T3 N0 stage II tumor was in the transverse colon. The smaller tumor measured 1.5 cm. The larger tumor measured 6.5 cm. Surgery was carried out on 09/28/2009. 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  2. History of marginal zone lymphoma probably dating back to 50. The patient had bone marrow involvement and splenomegaly at that time, also a hemolytic anemia. She underwent a splenectomy on  November 12, 1994. Through the years, the patient has received Rituxan most recently, dating back to May 2006. The patient received fludarabine in late 2002 and early 2003. She may have developed a pneumonitis from the fludarabine in December 2002. The patient has had involvement of the peripheral blood with villous lymphocytes. She appears to be in a state of complete clinical remission at this time. Tumor is CD5 and CD20 positive, also CD11c and FMC7 positive.  3. 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  4. History of lupus anticoagulant dating back to the 1990s.  5. History of iron deficiency anemia for which the patient received IV Feraheme in January 2012. Presumably, this occurred as a result of her colon cancer.  6. History of GERD resulting in cough diagnosed around 2010.  7. Dyslipidemia.  8. Hypothyroidism.  9. Irritable bowel syndrome.  10. Goiter  seen on PET scan from 09/04/2009.  11. Enlarged left tonsil diagnosed April 2011, currently resolved.  12. History of depression.   REVIEW OF SYSTEMS:   Constitutional: Denies fevers, chills or abnormal weight loss Eyes: Denies blurriness of vision Ears, nose, mouth, throat, and face: Denies mucositis or sore throat Respiratory: Denies cough, dyspnea or wheezes Cardiovascular: Denies palpitation, chest discomfort or lower extremity swelling Gastrointestinal:  Denies nausea, heartburn or change in bowel habits Skin: Denies abnormal skin rashes Lymphatics: Denies new lymphadenopathy or easy bruising Neurological:Denies numbness, tingling or new weaknesses Behavioral/Psych: Mood is stable, no new changes  All other systems were reviewed with the patient and are negative.  PHYSICAL EXAMINATION: ECOG PERFORMANCE STATUS: 1 - Symptomatic but completely ambulatory  Blood pressure 164/62, pulse 89, temperature 98.3 F (36.8 C), temperature source Oral, resp. rate 20, height 5' 6"  (1.676 m), weight 223 lb 8 oz (101.379 kg), last menstrual period 02/10/1970.  GENERAL:alert, mildly distressed but comfortable; moderately obese. Non-toxic appearing; alopecia SKIN: skin color, texture, turgor are normal, no  significant lesions; small reddish bumps on anterior frontal scalp.   EYES: normal, Conjunctiva are pink, sclera clear OROPHARYNX:no exudate, no erythema and lips, buccal mucosa, and tongue normal  NECK: supple, thyroid normal size, non-tender, without nodularity.  Left neck adenopathy resolved LYMPH:  no palpable lymphadenopathy in the cervical, axillary or supraclavicular LUNGS: clear to  auscultation and percussion with normal breathing effort HEART: regular rate & rhythm and no murmurs and no lower extremity edema ABDOMEN:abdomen soft, non-tender and normal bowel sounds; multiple surgical scars well healed.   Musculoskeletal:no cyanosis of digits and no clubbing  NEURO: alert & oriented x 3  with fluent speech, no focal motor/sensory deficits  Labs:  Lab Results  Component Value Date   WBC 3.5* 08/04/2013   HGB 11.6 08/04/2013   HCT 35.2 08/04/2013   MCV 96.2 08/04/2013   PLT 396 08/04/2013   NEUTROABS 1.4* 08/04/2013      Chemistry      Component Value Date/Time   NA 141 08/04/2013 0859   NA 131* 06/07/2013 2300   K 3.5 08/04/2013 0859   K 4.1 06/07/2013 2300   CL 92* 06/07/2013 2300   CL 105 07/14/2012 0939   CO2 23 08/04/2013 0859   CO2 21 06/07/2013 2300   BUN 7.9 08/04/2013 0859   BUN 17 06/07/2013 2300   CREATININE 0.7 08/04/2013 0859   CREATININE 0.74 06/07/2013 2300      Component Value Date/Time   CALCIUM 9.4 08/04/2013 0859   CALCIUM 9.3 06/07/2013 2300   CALCIUM 9.3 08/09/2009 0000   ALKPHOS 232* 08/04/2013 0859   ALKPHOS 529* 06/07/2013 2358   AST 35* 08/04/2013 0859   AST 121* 06/07/2013 2358   ALT 26 08/04/2013 0859   ALT 46* 06/07/2013 2358   BILITOT 0.22 08/04/2013 0859   BILITOT 2.5* 06/07/2013 2358     IMAGING STUDIES:  1. Digital screening mammogram from 08/05/2010 was negative.  2. Chest x-ray, 2 view, from 12/09/2010 was negative.  3. Digital screening mammogram on 08/20/2011 showed no specific evidence of malignancy  4. Chest x-ray, 2 view, from 11/19/2011 showed atherosclerotic calcifications within the arch of the aorta. There was evidence of a prior cholecystectomy and splenectomy, otherwise chest x-ray was negative.  5. CT scan of abdomen and pelvis with IV contrast obtained on 01/21/2012 showed no clear evidence of colon cancer metastasis. A retrocrural lymph node had increased in size but periportal adenopathy and periaortic adenopathy was stable to decreased. The retrocrural lymph node refer to measures 8 mm on image 12, increased from 5 mm on a prior study.  6. Chest x-ray, 2 view, from 06/01/2012 showed no acute abnormalities. There was borderline enlargement of the cardiac silhouette.  7. Chest x-ray, 2 view, from 03/09/2013, showed new pulmonary  nodules in the right lung. Cannot rule out  metastatic disease. Correlation with CT recommended. (reviewed by me) 8. CT scan of abdomen and pelvis with IV contrast on 03/09/2013 showed small right lower lobe pulmonary nodules measuring up to 8 mm, new, suspicious for metastases. Small right juxtadiaphragmatic, upper abdominal, retroperitoneal, and left pelvic lymph nodes, stable versus mildly increased. Prior splenectomy, cholecystectomy, and right hemicolectomy. PET-CT is suggested for further evaluation.  (reviewed by me) 9. PET/CT on 03/21/2013 revealed intensely hypermetabolic nodule within the left lobe of thyroid gland. Concern for thyroid carcinoma. 2. Two intensely hypermetabolic right lower lobe pulmonary nodules. Differential includes thyroid cancer metastasis or colon cancer metastasis. 3. Intense uptake within the right sacrum is concerning metastasis. Milder abnormal uptake within the T9 vertebral body is indeterminate but concerning for metastasis.  PROCEDURES:  Colonoscopy was carried out by Dr. Erskine Emery on 10/10/2010.  IMAGING: None  PATHOLOGY:  Lung, needle/core biopsy(ies), RLL sup seg nodule pet+ - MALIGNANT LYMPHORETICULAR NEOPLASM, MOST CONSISTENT WITH CLASSICAL HODGKIN LYMPHOMA. - SEE COMMENT. Diagnosis Note The core biopsies reveal  a diffuse lymphohistiocytic infiltrate. There are scattered Hodgkin Reed-Sternberg like cells with prominent nuclei. There are rare lacunar like cells. There are no significant eosinophils or plasma cells seen in the background. Immunohistochemistry reveals scattered CD30 positive cells with golgi staining. The cells are also positive for CD15 and PAX-5. CD45 interpretation is hampered by diffuse background T-cells. CD3 and CD43 highlight abundant small T-cells. Only scattered B-cells are seen with CD79a and CD20. EBV in situ (EBER) is negative. CDX-2, cytokeratin 7 and cytokeratin 20 are negative for carcinoma. Overall, these findings are consistent  with extranodal pulmonary involvement by classical Hodgkin lymphoma. The case was sent to [facility] for consultation (report [id number]), who agrees with the above interpretation. Dr. Juliann Mule was paged on 05/19/2013.  05/26/2013  Bone Marrow, Aspirate,Biopsy, and Clot, right iliac - HYPERCELLULAR BONE MARROW WITH ATYPICAL LYMPHOHISTIOCYTIC PROLIFERATION. - TRILINEAGE HEMATOPOIESIS.- SEE COMMENT.PERIPHERAL BLOOD:- NORMOCYTIC-NORMOCHROMIC ANEMIA. - MONOCYTOSIS.Diagnosis Note The aspirate material is extremely limited for morphologic evaluation. However, the core biopsy shows numerous variably sized and ill-defined interstitial and paratrabecular atypical lymphohistiocytic aggregates mostly composed of small lymphocytes and histiocytes in addition to scattering of larger CD30 positive lymphoid appearing cells. The histologic and phenotypic features are similar to previously diagnosed classical Hodgkin's lymphoma ([id number]) and consistent with marrow involvement by the same disease process. (BNS:ecj/gt 05/27/2013)BASSAM SMIR MD Pathologist, Electronic Signature (Case signed 05/31/2013)  ASSESSMENT: Diamond Boyd 73 y.o. female with a history of Hodgkin lymphoma - Plan: acyclovir (ZOVIRAX) 400 MG tablet, CBC with Differential, Comprehensive metabolic panel (Cmet) - [facility], Lactate dehydrogenase (LDH) - [facility], NM PET Image Restag (PS) Skull Base To Thigh, Pulmonary function test  Colon cancer - Plan: acyclovir (ZOVIRAX) 400 MG tablet, CBC with Differential, Comprehensive metabolic panel (Cmet) - [facility], Lactate dehydrogenase (LDH) - [facility], NM PET Image Restag (PS) Skull Base To Thigh now with Hodgkins lymphoma with B symptoms including (Fevers, night sweats and weight lost).   PLAN:  1. H.o of Marginal zone lymphoma now with multiple lung nodules consistent with Hodgkin's lymphoma (Stage IV with bone marrow involvement) with B symptoms.   --We had an extensive discussion and review of her  pulmonary pathology and final report conclusive for classical hodgkin's disease with bone marrow invovlement.  She reported fevers night sweats which would be considered a constitutional B symptoms. Her ESR is 84 (high).  Based on advanced lymphoma, she received a bone marrow biopsy which demonstrated involvement of Hodgkin's lymphoma.  Her echo also was obtained demonstrating a normal EF.  She received a R port-a-cath.   Her PFTs  demonstrated a moderate restriction (likely secondary to presence of the disease).   --We started chemotherapy with AVD x 6 cycles (category 2A) q28 days (doxorubicin, vinblastine, and dacarbazine) (Duggan DB, JCO, 2003).  Based on moderate restriction component of her PFTs, we will hold bleomycin.   We will repeat PFTs status post 2 cycles of chemotherapy and this is scheduled for next week. .   Day 1, Cycle 2B 08/04/2013  Doxorubicin (Adriamycin) 25 mg/m2  Vinblastine 6 mg/m2  Dacarbazine 375 mg/m2   -She will have Day1, Cycle 2B on 06/25.  We will give neulasta on Day #2 of each cycle due to delayed count recovery. We will obtain an restaging PET-CT following Cycle #2 (NCCN Guidelines, Version 2.2014, HODG-9) to assess response and further treatment based on [place] criteria for response.  This has been scheduled for August 17, 2013.   --Her LDH and ESR has normalized.  Her creatinine is  normal. Her calcium and serum potassium is normal. She was given anti-emetics and in addition we continue acyclovir 500 mg bid prophylaxis.   She has a listed allergy to allopurinol.   2. Chemotherapy-induced neutropenia. --ANC 1,400 today.   We will give neulasta shot on day# 2 of each cycle.   3. Colon cancer.   --Danah remains clinically stable. Her last colonoscopy was on  10/10/2010 by Dr. Deatra Ina.  We will make Dr. Deatra Ina of aware. CEA is stable at 1.9.     4. Thyroid nodule.    --Her PET/CT was also concerning for thyroid carcinoma. TSH on 01/04/2013 was 0.51.  Biopsy was  negative for malignancy.   5. Right eye irritation, resolved -- She is being followed by dermatology and opthamology.  She reports substantial improvement.   6. Follow-up.  --We will plan to see [place] again in 2 weeks for a brief symptom visit and for consideration of AVD Cycle 3A.   All questions were answered. The patient knows to call the clinic with any problems, questions or concerns. We can certainly see the patient much sooner if necessary.  I spent 15 minutes counseling the patient face to face. The total time spent in the appointment was 25 minutes.    CHISM, Karinna Beadles, MD 08/04/2013 4:10 PM

## 2013-08-04 NOTE — Patient Instructions (Signed)
[facility] Discharge Instructions for Patients Receiving Chemotherapy  Today you received the following chemotherapy agents Adriamycin, Vinblastine, and Dacarbazine.  To help prevent nausea and vomiting after your treatment, we encourage you to take your nausea medication as prescribed.   If you develop nausea and vomiting that is not controlled by your nausea medication, call the clinic.   BELOW ARE SYMPTOMS THAT SHOULD BE REPORTED IMMEDIATELY:  *FEVER GREATER THAN 100.5 F  *CHILLS WITH OR WITHOUT FEVER  NAUSEA AND VOMITING THAT IS NOT CONTROLLED WITH YOUR NAUSEA MEDICATION  *UNUSUAL SHORTNESS OF BREATH  *UNUSUAL BRUISING OR BLEEDING  TENDERNESS IN MOUTH AND THROAT WITH OR WITHOUT PRESENCE OF ULCERS  *URINARY PROBLEMS  *BOWEL PROBLEMS  UNUSUAL RASH Items with * indicate a potential emergency and should be followed up as soon as possible.  Feel free to call the clinic if you have any questions or concerns. The clinic phone number is [phone].

## 2013-08-05 ENCOUNTER — Encounter: Payer: Self-pay | Admitting: Internal Medicine

## 2013-08-05 ENCOUNTER — Ambulatory Visit (HOSPITAL_BASED_OUTPATIENT_CLINIC_OR_DEPARTMENT_OTHER): Payer: Medicare Other

## 2013-08-05 VITALS — BP 157/70 | HR 87 | Temp 98.9°F

## 2013-08-05 DIAGNOSIS — D702 Other drug-induced agranulocytosis: Secondary | ICD-10-CM

## 2013-08-05 DIAGNOSIS — C8190 Hodgkin lymphoma, unspecified, unspecified site: Secondary | ICD-10-CM

## 2013-08-05 MED ORDER — PEGFILGRASTIM INJECTION 6 MG/0.6ML
6.0000 mg | Freq: Once | SUBCUTANEOUS | Status: AC
  Administered 2013-08-05: 6 mg via SUBCUTANEOUS
  Filled 2013-08-05: qty 0.6

## 2013-08-05 NOTE — Progress Notes (Signed)
Insurance is paying Velban and Decarbazine.

## 2013-08-17 ENCOUNTER — Encounter (HOSPITAL_COMMUNITY)
Admission: RE | Admit: 2013-08-17 | Discharge: 2013-08-17 | Disposition: A | Discharge status: Home / Self Care | Payer: Medicare Other | Source: Ambulatory Visit | Attending: Internal Medicine | Admitting: Internal Medicine

## 2013-08-17 DIAGNOSIS — E041 Nontoxic single thyroid nodule: Secondary | ICD-10-CM | POA: Diagnosis not present

## 2013-08-17 DIAGNOSIS — C189 Malignant neoplasm of colon, unspecified: Secondary | ICD-10-CM

## 2013-08-17 DIAGNOSIS — M949 Disorder of cartilage, unspecified: Secondary | ICD-10-CM | POA: Diagnosis not present

## 2013-08-17 DIAGNOSIS — M899 Disorder of bone, unspecified: Secondary | ICD-10-CM | POA: Insufficient documentation

## 2013-08-17 DIAGNOSIS — C8190 Hodgkin lymphoma, unspecified, unspecified site: Secondary | ICD-10-CM | POA: Insufficient documentation

## 2013-08-17 DIAGNOSIS — R911 Solitary pulmonary nodule: Secondary | ICD-10-CM | POA: Diagnosis not present

## 2013-08-17 LAB — GLUCOSE, CAPILLARY: Glucose-Capillary: 109 mg/dL — ABNORMAL HIGH (ref 70–99)

## 2013-08-17 MED ORDER — FLUDEOXYGLUCOSE F - 18 (FDG) INJECTION
12.0000 | Freq: Once | INTRAVENOUS | Status: AC | PRN
  Administered 2013-08-17: 12 via INTRAVENOUS

## 2013-08-18 ENCOUNTER — Other Ambulatory Visit (HOSPITAL_BASED_OUTPATIENT_CLINIC_OR_DEPARTMENT_OTHER): Payer: Medicare Other

## 2013-08-18 ENCOUNTER — Telehealth: Payer: Self-pay | Admitting: Internal Medicine

## 2013-08-18 ENCOUNTER — Ambulatory Visit (HOSPITAL_BASED_OUTPATIENT_CLINIC_OR_DEPARTMENT_OTHER): Payer: Medicare Other

## 2013-08-18 ENCOUNTER — Ambulatory Visit (HOSPITAL_BASED_OUTPATIENT_CLINIC_OR_DEPARTMENT_OTHER): Payer: Medicare Other | Admitting: Physician Assistant

## 2013-08-18 VITALS — BP 151/68 | HR 91 | Temp 98.9°F | Resp 18 | Ht 66.0 in | Wt 221.0 lb

## 2013-08-18 DIAGNOSIS — C8190 Hodgkin lymphoma, unspecified, unspecified site: Secondary | ICD-10-CM

## 2013-08-18 DIAGNOSIS — C184 Malignant neoplasm of transverse colon: Secondary | ICD-10-CM

## 2013-08-18 DIAGNOSIS — D702 Other drug-induced agranulocytosis: Secondary | ICD-10-CM | POA: Diagnosis not present

## 2013-08-18 DIAGNOSIS — C189 Malignant neoplasm of colon, unspecified: Secondary | ICD-10-CM

## 2013-08-18 DIAGNOSIS — E041 Nontoxic single thyroid nodule: Secondary | ICD-10-CM

## 2013-08-18 DIAGNOSIS — C8587 Other specified types of non-Hodgkin lymphoma, spleen: Secondary | ICD-10-CM

## 2013-08-18 DIAGNOSIS — Z5111 Encounter for antineoplastic chemotherapy: Secondary | ICD-10-CM

## 2013-08-18 LAB — CBC WITH DIFFERENTIAL/PLATELET
BASO%: 1.4 % (ref 0.0–2.0)
BASOS ABS: 0.1 10*3/uL (ref 0.0–0.1)
EOS ABS: 0.1 10*3/uL (ref 0.0–0.5)
EOS%: 3.2 % (ref 0.0–7.0)
HEMATOCRIT: 35.2 % (ref 34.8–46.6)
HEMOGLOBIN: 11.6 g/dL (ref 11.6–15.9)
LYMPH#: 1 10*3/uL (ref 0.9–3.3)
LYMPH%: 22.3 % (ref 14.0–49.7)
MCH: 31.2 pg (ref 25.1–34.0)
MCHC: 33 g/dL (ref 31.5–36.0)
MCV: 94.6 fL (ref 79.5–101.0)
MONO#: 1.1 10*3/uL — AB (ref 0.1–0.9)
MONO%: 24.5 % — ABNORMAL HIGH (ref 0.0–14.0)
NEUT%: 48.6 % (ref 38.4–76.8)
NEUTROS ABS: 2.1 10*3/uL (ref 1.5–6.5)
Platelets: 454 10*3/uL — ABNORMAL HIGH (ref 145–400)
RBC: 3.72 10*6/uL (ref 3.70–5.45)
RDW: 18 % — AB (ref 11.2–14.5)
WBC: 4.4 10*3/uL (ref 3.9–10.3)

## 2013-08-18 LAB — COMPREHENSIVE METABOLIC PANEL (CC13)
ALBUMIN: 3.6 g/dL (ref 3.5–5.0)
ALT: 27 U/L (ref 0–55)
ANION GAP: 8 meq/L (ref 3–11)
AST: 32 U/L (ref 5–34)
Alkaline Phosphatase: 214 U/L — ABNORMAL HIGH (ref 40–150)
BUN: 7.7 mg/dL (ref 7.0–26.0)
CALCIUM: 9.7 mg/dL (ref 8.4–10.4)
CHLORIDE: 108 meq/L (ref 98–109)
CO2: 26 meq/L (ref 22–29)
Creatinine: 0.7 mg/dL (ref 0.6–1.1)
Glucose: 172 mg/dl — ABNORMAL HIGH (ref 70–140)
POTASSIUM: 3.8 meq/L (ref 3.5–5.1)
Sodium: 142 mEq/L (ref 136–145)
Total Bilirubin: 0.25 mg/dL (ref 0.20–1.20)
Total Protein: 6.1 g/dL — ABNORMAL LOW (ref 6.4–8.3)

## 2013-08-18 LAB — LACTATE DEHYDROGENASE (CC13): LDH: 172 U/L (ref 125–245)

## 2013-08-18 MED ORDER — DEXAMETHASONE SODIUM PHOSPHATE 20 MG/5ML IJ SOLN
20.0000 mg | Freq: Once | INTRAMUSCULAR | Status: AC
  Administered 2013-08-18: 20 mg via INTRAVENOUS

## 2013-08-18 MED ORDER — SODIUM CHLORIDE 0.9 % IJ SOLN
10.0000 mL | INTRAMUSCULAR | Status: DC | PRN
  Administered 2013-08-18: 10 mL
  Filled 2013-08-18: qty 10

## 2013-08-18 MED ORDER — ONDANSETRON 16 MG/50ML IVPB (CHCC)
16.0000 mg | Freq: Once | INTRAVENOUS | Status: AC
  Administered 2013-08-18: 16 mg via INTRAVENOUS

## 2013-08-18 MED ORDER — SODIUM CHLORIDE 0.9 % IV SOLN
370.0000 mg/m2 | Freq: Once | INTRAVENOUS | Status: AC
  Administered 2013-08-18: 800 mg via INTRAVENOUS
  Filled 2013-08-18: qty 40

## 2013-08-18 MED ORDER — DEXAMETHASONE SODIUM PHOSPHATE 20 MG/5ML IJ SOLN
INTRAMUSCULAR | Status: AC
  Filled 2013-08-18: qty 5

## 2013-08-18 MED ORDER — SODIUM CHLORIDE 0.9 % IV SOLN
Freq: Once | INTRAVENOUS | Status: AC
  Administered 2013-08-18: 12:00:00 via INTRAVENOUS

## 2013-08-18 MED ORDER — DOXORUBICIN HCL CHEMO IV INJECTION 2 MG/ML
25.0000 mg/m2 | Freq: Once | INTRAVENOUS | Status: AC
  Administered 2013-08-18: 54 mg via INTRAVENOUS
  Filled 2013-08-18: qty 27

## 2013-08-18 MED ORDER — ONDANSETRON 16 MG/50ML IVPB (CHCC)
INTRAVENOUS | Status: AC
  Filled 2013-08-18: qty 16

## 2013-08-18 MED ORDER — HEPARIN SOD (PORK) LOCK FLUSH 100 UNIT/ML IV SOLN
500.0000 [IU] | Freq: Once | INTRAVENOUS | Status: AC | PRN
  Administered 2013-08-18: 500 [IU]
  Filled 2013-08-18: qty 5

## 2013-08-18 MED ORDER — VINBLASTINE SULFATE CHEMO INJECTION 1 MG/ML
6.0000 mg/m2 | Freq: Once | INTRAVENOUS | Status: AC
  Administered 2013-08-18: 13 mg via INTRAVENOUS
  Filled 2013-08-18: qty 13

## 2013-08-18 NOTE — Patient Instructions (Signed)
[facility] Discharge Instructions for Patients Receiving Chemotherapy  Today you received the following chemotherapy agents adriamycin,vinblastine, dacarbazine,  To help prevent nausea and vomiting after your treatment, we encourage you to take your nausea medication as directed   If you develop nausea and vomiting that is not controlled by your nausea medication, call the clinic.   BELOW ARE SYMPTOMS THAT SHOULD BE REPORTED IMMEDIATELY:  *FEVER GREATER THAN 100.5 F  *CHILLS WITH OR WITHOUT FEVER  NAUSEA AND VOMITING THAT IS NOT CONTROLLED WITH YOUR NAUSEA MEDICATION  *UNUSUAL SHORTNESS OF BREATH  *UNUSUAL BRUISING OR BLEEDING  TENDERNESS IN MOUTH AND THROAT WITH OR WITHOUT PRESENCE OF ULCERS  *URINARY PROBLEMS  *BOWEL PROBLEMS  UNUSUAL RASH Items with * indicate a potential emergency and should be followed up as soon as possible.  Feel free to call the clinic you have any questions or concerns. The clinic phone number is [phone].

## 2013-08-18 NOTE — Telephone Encounter (Signed)
gv and printecd appt sched appt sched adn avs for pt for July and Aug...sed added tx.

## 2013-08-19 ENCOUNTER — Ambulatory Visit (HOSPITAL_BASED_OUTPATIENT_CLINIC_OR_DEPARTMENT_OTHER): Payer: Medicare Other

## 2013-08-19 VITALS — BP 127/56 | HR 85 | Temp 98.7°F

## 2013-08-19 DIAGNOSIS — C8190 Hodgkin lymphoma, unspecified, unspecified site: Secondary | ICD-10-CM

## 2013-08-19 DIAGNOSIS — D702 Other drug-induced agranulocytosis: Secondary | ICD-10-CM | POA: Diagnosis not present

## 2013-08-19 MED ORDER — TBO-FILGRASTIM 480 MCG/0.8ML ~~LOC~~ SOSY
480.0000 ug | PREFILLED_SYRINGE | Freq: Once | SUBCUTANEOUS | Status: AC
  Administered 2013-08-19: 480 ug via SUBCUTANEOUS
  Filled 2013-08-19: qty 0.8

## 2013-08-20 ENCOUNTER — Encounter: Payer: Self-pay | Admitting: Physician Assistant

## 2013-08-20 ENCOUNTER — Ambulatory Visit (HOSPITAL_BASED_OUTPATIENT_CLINIC_OR_DEPARTMENT_OTHER): Payer: Medicare Other

## 2013-08-20 VITALS — BP 148/56 | HR 95 | Temp 99.0°F | Resp 20

## 2013-08-20 DIAGNOSIS — D702 Other drug-induced agranulocytosis: Secondary | ICD-10-CM | POA: Diagnosis not present

## 2013-08-20 MED ORDER — TBO-FILGRASTIM 480 MCG/0.8ML ~~LOC~~ SOSY
480.0000 ug | PREFILLED_SYRINGE | Freq: Once | SUBCUTANEOUS | Status: AC
  Administered 2013-08-20: 480 ug via SUBCUTANEOUS

## 2013-08-20 NOTE — Patient Instructions (Signed)
Follow-up in 2 weeks

## 2013-08-20 NOTE — Patient Instructions (Signed)
Tbo-Filgrastim injection What is this medicine? TBO-FILGRASTIM is used to help decrease the time you have low amounts of white blood cells after cancer treatment. It helps the body make more white blood cells. Increasing the amount of white blood cells helps to decrease the risk of infection and fever. This medicine may be used for other purposes; ask your health care provider or pharmacist if you have questions. COMMON BRAND NAME(S): Granix What should I tell my health care provider before I take this medicine? They need to know if you have any of these conditions: -history of blood diseases, like sickle cell anemia or leukemia -an unusual or allergic reaction to tbo-filgrastim, filgrastim, pegfilgrastim, other medicines, foods, dyes, or preservatives -pregnant or trying to get pregnant -breast-feeding How should I use this medicine? This medicine is for injection under the skin. It is given by a health care professional in a hospital or clinic setting. Talk to your pediatrician regarding the use of this medicine in children. Special care may be needed. Overdosage: If you think you've taken too much of this medicine contact a poison control center or emergency room at once. Overdosage: If you think you have taken too much of this medicine contact a poison control center or emergency room at once. NOTE: This medicine is only for you. Do not share this medicine with others. What if I miss a dose? Keep appointments for follow-up doses as directed. It is important not to miss your dose. Call your doctor or health care professional if you are unable to keep an appointment. What may interact with this medicine? -lithium This list may not describe all possible interactions. Give your health care provider a list of all the medicines, herbs, non-prescription drugs, or dietary supplements you use. Also tell them if you smoke, drink alcohol, or use illegal drugs. Some items may interact with your  medicine. What should I watch for while using this medicine? Your condition will be monitored carefully while you are receiving this medicine. You may need blood work done while you are taking this medicine. Avoid taking products that contain aspirin, acetaminophen, ibuprofen, naproxen, or ketoprofen unless instructed by your doctor. These medicines may hide a fever. Call your doctor or health care professional for advice if you get a fever, chills or sore throat, or other symptoms of a cold or flu. Do not treat yourself. What side effects may I notice from receiving this medicine? Side effects that you should report to your doctor or health care professional as soon as possible: -allergic reactions like skin rash, itching or hives, swelling of the face, lips, or tongue -breathing problems -fever -stomach pain Side effects that usually do not require medical attention (Report these to your doctor or health care professional if they continue or are bothersome.): -bone pain This list may not describe all possible side effects. Call your doctor for medical advice about side effects. You may report side effects to FDA at 1-800-FDA-1088. Where should I keep my medicine? This drug is given in a hospital or clinic and will not be stored at home. NOTE: This sheet is a summary. It may not cover all possible information. If you have questions about this medicine, talk to your doctor, pharmacist, or health care provider.  2015, [employer]vier/Gold Standard. (2010-10-16 16:41:24)

## 2013-08-20 NOTE — Progress Notes (Signed)
[facility] OFFICE PROGRESS NOTE  Roxy Manns, MD [address], [place] [address]  DIAGNOSIS: Hodgkin lymphoma - Plan: CBC with Differential, Comprehensive metabolic panel (Cmet) - [facility], Lactate dehydrogenase (LDH) - [facility]  LYMPHOMA NEC, MLIG, SPLEEN  No chief complaint on file.  CURRENT THERAPY:  Started AVD (without Bleomycin due to moderate restriction per PFTs) on 06/08/2013.  Cycle 1A was dose-reduced due to elevated liver function.  We will repeat PFTs following 2 cycles of AVD to consider addition of Bleomycin.   INTERVAL HISTORY: JANEL BEANE 73 y.o. 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   She was last seen by Dr. Rosie Fate on 08/04/2013.  She is accompanied by her husband Ray.   She tolerated her last cycle of AVD without Bleo on 08/04/2013 and received neupogen on 06/26. She presents to proceed with cycle #3A. She complains of protracted pain from the Neulasta injections. She currently is dealing with some "sinus issues" but voice no other complaints. She denies fevers or night sweats while on chemotherapy. She denies specifically any pain, difficulty eating, trouble with her bowels, blood in the stool.     MEDICAL HISTORY: Past Medical History  Diagnosis Date  . Colon polyps   . Depression   . Hypothyroidism   . Osteoarthritis     hands  . Peripheral vascular disease   . Degenerative disk disease     spine in center in past   . 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 Cancer     skin cancer- basal cell on arm / colon 2011  . Lymphoma   . Colon cancer     08-2009    INTERIM HISTORY: has MALIGNANT NEOPLASM OF TRANSVERSE COLON; LYMPHOMA NEC, MLIG, SPLEEN;  HYPERCHOLESTEROLEMIA, PURE; DISORDERS OF PHOSPHORUS METABOLISM; Anxiety state, unspecified; DEPRESSION; PERIPHERAL VASCULAR DISEASE; Other chronic sinusitis; ALLERGIC  RHINITIS; GERD; OVERACTIVE BLADDER; MENOPAUSAL SYNDROME; OSTEOARTHRITIS; LEG EDEMA; THROAT PAIN, CHRONIC; SKIN CANCER, HX OF; COLONIC POLYPS, HX OF; Elevated blood pressure; Obesity; Hypothyroid; Varicose veins; Other asplenic status; History of anemia; Screening for lipoid disorders; Other screening mammogram; Routine gynecological examination; Colon cancer screening; Skin lesion of face; Thoracic back pain; Left knee pain; Encounter for Medicare annual wellness exam; Cystocele; Herpes zoster; Vaginal pain; Other malaise and fatigue; Acute sinusitis; Left temporal headache; Hyperglycemia; Conjunctivitis, acute; Cold sore; Hodgkin lymphoma; Dysuria; Colon cancer; and Fever on her problem list.    ALLERGIES:  is allergic to achromycin; allopurinol; astelin; cephalexin; codeine; meloxicam; minocycline; nabumetone; nyquil; penicillins; sulfa antibiotics; zolpidem tartrate; buspar; and ciprofloxacin.  MEDICATIONS: has a current medication list which includes the following prescription(s): acyclovir, calcium carbonate-vitamin d, celecoxib, cholecalciferol, epipen 2-pak, estradiol, fexofenadine, fluoxetine, fluticasone, furosemide, levothyroxine, lidocaine-prilocaine, omega-3, omeprazole, psyllium, simvastatin, tobramycin-dexamethasone, vitamin e, lorazepam, ondansetron, oxycodone, and prochlorperazine, and the following Facility-Administered Medications: sodium chloride.  SURGICAL HISTORY:  Past Surgical History  Procedure Laterality Date  . Cholecystectomy    . Splenectomy      lymphoma  . Breast biopsy  1996  . Plantar fascia release    . Ventral hernia repair  1998  . Tendon release      Right thumb   . Colectomy  8/11  . Vaginal hysterectomy    .  Bone marrow biopsy  05/27/13   PROBLEM LIST:  1. Adenocarcinoma of the colon with 2  synchronous primaries dating back to August 2011 when the patient was found to have anemia. Both tumors were associated with negative lymph nodes. One tumor was T2 N0. The other tumor was T3 N0. The T2 N0 stage I tumor was in the sigmoid colon. The T3 N0 stage II tumor was in the transverse colon. The smaller tumor measured 1.5 cm. The larger tumor measured 6.5 cm. Surgery was carried out on 09/28/2009. 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  2. History of marginal zone lymphoma probably dating back to 40. The patient had bone marrow involvement and splenomegaly at that time, also a hemolytic anemia. She underwent a splenectomy on  November 12, 1994. Through the years, the patient has received Rituxan most recently, dating back to May 2006. The patient received fludarabine in late 2002 and early 2003. She may have developed a pneumonitis from the fludarabine in December 2002. The patient has had involvement of the peripheral blood with villous lymphocytes. She appears to be in a state of complete clinical remission at this time. Tumor is CD5 and CD20 positive, also CD11c and FMC7 positive.  3. 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  4. History of lupus anticoagulant dating back to the 1990s.  5. History of iron deficiency anemia for which the patient received IV Feraheme in January 2012. Presumably, this occurred as a result of her colon cancer.  6. History of GERD resulting in cough diagnosed around 2010.  7. Dyslipidemia.  8. Hypothyroidism.  9. Irritable bowel syndrome.  10. Goiter seen on PET scan from 09/04/2009.  11. Enlarged left tonsil diagnosed April 2011, currently resolved.  12. History of depression.   REVIEW OF SYSTEMS:   Constitutional: Denies fevers, chills or abnormal weight loss Eyes: Denies blurriness of vision Ears, nose, mouth, throat, and face: Denies mucositis or sore throat, reports sinus pressure   Respiratory: Denies cough, dyspnea or wheezes Cardiovascular: Denies palpitation, chest discomfort or lower extremity swelling Gastrointestinal:  Denies nausea, heartburn or change in bowel habits Skin: Denies abnormal skin rashes Lymphatics: Denies new lymphadenopathy or easy bruising Neurological:Denies numbness, tingling or new weaknesses Behavioral/Psych: Mood is stable, no new changes  All other systems were reviewed with the patient and are negative.  PHYSICAL EXAMINATION: ECOG PERFORMANCE STATUS: 1 - Symptomatic but completely ambulatory  Blood pressure 151/68, pulse 91, temperature 98.9 F (37.2 C), temperature source Oral, resp. rate 18, height _0  (1.676 m), weight 221 lb (100.245 kg), last menstrual period 02/10/1970, SpO2 98.00%.  GENERAL:alert, mildly distressed but comfortable; moderately obese. Non-toxic appearing; alopecia SKIN: skin color, texture, turgor are normal, no  significant lesions; small reddish bumps on anterior frontal scalp.   EYES: normal, Conjunctiva are pink, sclera clear OROPHARYNX:no exudate, no erythema and lips, buccal mucosa, and tongue normal  NECK: supple, thyroid normal size, non-tender, without nodularity.  Left neck adenopathy resolved LYMPH:  no palpable lymphadenopathy in the cervical, axillary or supraclavicular LUNGS: clear to auscultation and percussion with normal breathing effort HEART: regular rate & rhythm and no murmurs and no lower extremity edema ABDOMEN:abdomen soft, non-tender and normal bowel sounds; multiple surgical scars well healed.   Musculoskeletal:no cyanosis of digits and no clubbing  NEURO: alert & oriented x 3 with fluent speech, no focal motor/sensory deficits  Labs:  Lab Results  Component Value Date   WBC  4.4 08/18/2013   HGB 11.6 08/18/2013   HCT 35.2 08/18/2013   MCV 94.6 08/18/2013   PLT 454* 08/18/2013   NEUTROABS 2.1 08/18/2013      Chemistry      Component Value Date/Time   NA 142 08/18/2013 0953   NA 131*  06/07/2013 2300   K 3.8 08/18/2013 0953   K 4.1 06/07/2013 2300   CL 92* 06/07/2013 2300   CL 105 07/14/2012 0939   CO2 26 08/18/2013 0953   CO2 21 06/07/2013 2300   BUN 7.7 08/18/2013 0953   BUN 17 06/07/2013 2300   CREATININE 0.7 08/18/2013 0953   CREATININE 0.74 06/07/2013 2300      Component Value Date/Time   CALCIUM 9.7 08/18/2013 0953   CALCIUM 9.3 06/07/2013 2300   CALCIUM 9.3 08/09/2009 0000   ALKPHOS 214* 08/18/2013 0953   ALKPHOS 529* 06/07/2013 2358   AST 32 08/18/2013 0953   AST 121* 06/07/2013 2358   ALT 27 08/18/2013 0953   ALT 46* 06/07/2013 2358   BILITOT 0.25 08/18/2013 0953   BILITOT 2.5* 06/07/2013 2358     IMAGING STUDIES:  1. Digital screening mammogram from 08/05/2010 was negative.  2. Chest x-ray, 2 view, from 12/09/2010 was negative.  3. Digital screening mammogram on 08/20/2011 showed no specific evidence of malignancy  4. Chest x-ray, 2 view, from 11/19/2011 showed atherosclerotic calcifications within the arch of the aorta. There was evidence of a prior cholecystectomy and splenectomy, otherwise chest x-ray was negative.  5. CT scan of abdomen and pelvis with IV contrast obtained on 01/21/2012 showed no clear evidence of colon cancer metastasis. A retrocrural lymph node had increased in size but periportal adenopathy and periaortic adenopathy was stable to decreased. The retrocrural lymph node refer to measures 8 mm on image 12, increased from 5 mm on a prior study.  6. Chest x-ray, 2 view, from 06/01/2012 showed no acute abnormalities. There was borderline enlargement of the cardiac silhouette.  7. Chest x-ray, 2 view, from 03/09/2013, showed new pulmonary nodules in the right lung. Cannot rule out  metastatic disease. Correlation with CT recommended. (reviewed by me) 8. CT scan of abdomen and pelvis with IV contrast on 03/09/2013 showed small right lower lobe pulmonary nodules measuring up to 8 mm, new, suspicious for metastases. Small right juxtadiaphragmatic, upper abdominal,  retroperitoneal, and left pelvic lymph nodes, stable versus mildly increased. Prior splenectomy, cholecystectomy, and right hemicolectomy. PET-CT is suggested for further evaluation.  (reviewed by me) 9. PET/CT on 03/21/2013 revealed intensely hypermetabolic nodule within the left lobe of thyroid gland. Concern for thyroid carcinoma. 2. Two intensely hypermetabolic right lower lobe pulmonary nodules. Differential includes thyroid cancer metastasis or colon cancer metastasis. 3. Intense uptake within the right sacrum is concerning metastasis. Milder abnormal uptake within the T9 vertebral body is indeterminate but concerning for metastasis.  PROCEDURES:  Colonoscopy was carried out by Dr. Erskine Emery on 10/10/2010.  IMAGING: None  PATHOLOGY:  Lung, needle/core biopsy(ies), RLL sup seg nodule pet+ - MALIGNANT LYMPHORETICULAR NEOPLASM, MOST CONSISTENT WITH CLASSICAL HODGKIN LYMPHOMA. - SEE COMMENT. Diagnosis Note The core biopsies reveal a diffuse lymphohistiocytic infiltrate. There are scattered Hodgkin Reed-Sternberg like cells with prominent nuclei. There are rare lacunar like cells. There are no significant eosinophils or plasma cells seen in the background. Immunohistochemistry reveals scattered CD30 positive cells with golgi staining. The cells are also positive for CD15 and PAX-5. CD45 interpretation is hampered by diffuse background T-cells. CD3 and CD43 highlight abundant small T-cells. Only scattered  B-cells are seen with CD79a and CD20. EBV in situ (EBER) is negative. CDX-2, cytokeratin 7 and cytokeratin 20 are negative for carcinoma. Overall, these findings are consistent with extranodal pulmonary involvement by classical Hodgkin lymphoma. The case was sent to [facility] for consultation (report [phone]), who agrees with the above interpretation. Dr. Juliann Mule was paged on 05/19/2013.  05/26/2013  Bone Marrow, Aspirate,Biopsy, and Clot, right iliac - HYPERCELLULAR BONE  MARROW WITH ATYPICAL LYMPHOHISTIOCYTIC PROLIFERATION. - TRILINEAGE HEMATOPOIESIS.- SEE COMMENT.PERIPHERAL BLOOD:- NORMOCYTIC-NORMOCHROMIC ANEMIA. - MONOCYTOSIS.Diagnosis Note The aspirate material is extremely limited for morphologic evaluation. However, the core biopsy shows numerous variably sized and ill-defined interstitial and paratrabecular atypical lymphohistiocytic aggregates mostly composed of small lymphocytes and histiocytes in addition to scattering of larger CD30 positive lymphoid appearing cells. The histologic and phenotypic features are similar to previously diagnosed classical Hodgkin's lymphoma ([id number]) and consistent with marrow involvement by the same disease process. (BNS:ecj/gt 05/27/2013)BASSAM SMIR MD Pathologist, Electronic Signature (Case signed 05/31/2013)  ASSESSMENT: Diamond Boyd 73 y.o. female with a history of Hodgkin lymphoma - Plan: CBC with Differential, Comprehensive metabolic panel (Cmet) - [facility], Lactate dehydrogenase (LDH) - [facility]  LYMPHOMA NEC, MLIG, SPLEEN now with Hodgkins lymphoma with B symptoms including (Fevers, night sweats and weight lost).   PLAN:  1. H.o of Marginal zone lymphoma now with multiple lung nodules consistent with Hodgkin's lymphoma (Stage IV with bone marrow involvement) with B symptoms.   --We had an extensive discussion and review of her pulmonary pathology and final report conclusive for classical hodgkin's disease with bone marrow invovlement.  She reported fevers night sweats which would be considered a constitutional B symptoms. Her ESR is 84 (high).  Based on advanced lymphoma, she received a bone marrow biopsy which demonstrated involvement of Hodgkin's lymphoma.  Her echo also was obtained demonstrating a normal EF.  She received a R port-a-cath.   Her PFTs  demonstrated a moderate restriction (likely secondary to presence of the disease).   --We started chemotherapy with AVD x 6 cycles (category 2A) q28 days (doxorubicin,  vinblastine, and dacarbazine) (Duggan DB, J[employer], 2003).  Based on moderate restriction component of her PFTs, we will hold bleomycin.   We will repeat PFTs status post 2 cycles of chemotherapy and this is scheduled for next week. .   Day 1, Cycle 3A 08/18/2013  Doxorubicin (Adriamycin) 25 mg/m2  Vinblastine 6 mg/m2  Dacarbazine 375 mg/m2   -She will have Day1, Cycle 3A on 07/09.  We will give Granix on Day #2 and #3 of each cycle due to delayed count recovery as well as protracted pain from Neulasta.. A restaging PET-CT was obtained on 08/17/2013 following Cycle #2 (NCCN Guidelines, Version 2.2014, HODG-9) to assess response and further treatment based on [place] criteria for response. The study revealed interval decrease in the size of a hypermetabolic right lower lobe pulmonary nodule consistent with interval response to therapy. The thyroid nodule remained hyper metabolic however this has been biopsied and was found to be benign.    --Her LDH and ESR has normalized.  Her creatinine is normal. Her calcium and serum potassium is normal. She will continue on anti-emetics and in addition  continue acyclovir 500 mg bid prophylaxis.  She has a listed allergy to allopurinol.   2. Chemotherapy-induced neutropenia. --ANC 1,400 today.   We will change to grab X. on days #2 and 3 after each cycle of to continue to support her neutrophil count and hopefully for her to have less protracted  pain related to these injections.   3. Colon cancer.   --Ronna remains clinically stable. Her last colonoscopy was on  10/10/2010 by Dr. Deatra Ina.  CEA is stable at 1.9.     4. Thyroid nodule.    --Her PET/CT was also concerning for thyroid carcinoma. TSH on 01/04/2013 was 0.51.  Biopsy was negative for malignancy.   5. Follow-up.  --We will plan to see Derin again in 2 weeks for a brief symptom visit and for consideration of AVD Cycle 3B.   All questions were answered. The patient knows to call the clinic with any  problems, questions or concerns. We can certainly see the patient much sooner if necessary.  I spent 20 minutes counseling the patient face to face. The total time spent in the appointment was 30 minutes.  Patient was reviewed with Dr. Juliann Mule.    Carlton Adam, PA-C 08/20/2013 5:25 PM

## 2013-08-27 ENCOUNTER — Other Ambulatory Visit: Payer: Self-pay | Admitting: Family Medicine

## 2013-09-01 ENCOUNTER — Ambulatory Visit: Payer: BLUE CROSS/BLUE SHIELD

## 2013-09-01 ENCOUNTER — Other Ambulatory Visit: Payer: Self-pay | Admitting: Internal Medicine

## 2013-09-01 ENCOUNTER — Other Ambulatory Visit: Payer: BLUE CROSS/BLUE SHIELD

## 2013-09-01 ENCOUNTER — Telehealth: Payer: Self-pay | Admitting: Internal Medicine

## 2013-09-01 ENCOUNTER — Other Ambulatory Visit (HOSPITAL_BASED_OUTPATIENT_CLINIC_OR_DEPARTMENT_OTHER): Payer: Medicare Other

## 2013-09-01 ENCOUNTER — Encounter: Payer: Self-pay | Admitting: Internal Medicine

## 2013-09-01 ENCOUNTER — Ambulatory Visit (HOSPITAL_BASED_OUTPATIENT_CLINIC_OR_DEPARTMENT_OTHER): Payer: Medicare Other | Admitting: Internal Medicine

## 2013-09-01 VITALS — BP 156/61 | HR 90 | Temp 98.4°F | Resp 18 | Ht 66.0 in | Wt 219.7 lb

## 2013-09-01 DIAGNOSIS — C8190 Hodgkin lymphoma, unspecified, unspecified site: Secondary | ICD-10-CM | POA: Diagnosis not present

## 2013-09-01 DIAGNOSIS — E041 Nontoxic single thyroid nodule: Secondary | ICD-10-CM

## 2013-09-01 DIAGNOSIS — Z85038 Personal history of other malignant neoplasm of large intestine: Secondary | ICD-10-CM | POA: Diagnosis not present

## 2013-09-01 DIAGNOSIS — D702 Other drug-induced agranulocytosis: Secondary | ICD-10-CM

## 2013-09-01 DIAGNOSIS — D701 Agranulocytosis secondary to cancer chemotherapy: Secondary | ICD-10-CM | POA: Insufficient documentation

## 2013-09-01 DIAGNOSIS — D709 Neutropenia, unspecified: Secondary | ICD-10-CM

## 2013-09-01 DIAGNOSIS — T451X5A Adverse effect of antineoplastic and immunosuppressive drugs, initial encounter: Secondary | ICD-10-CM

## 2013-09-01 LAB — COMPREHENSIVE METABOLIC PANEL (CC13)
ALBUMIN: 3.4 g/dL — AB (ref 3.5–5.0)
ALT: 28 U/L (ref 0–55)
ANION GAP: 8 meq/L (ref 3–11)
AST: 32 U/L (ref 5–34)
Alkaline Phosphatase: 183 U/L — ABNORMAL HIGH (ref 40–150)
BUN: 7 mg/dL (ref 7.0–26.0)
CALCIUM: 9.3 mg/dL (ref 8.4–10.4)
CHLORIDE: 107 meq/L (ref 98–109)
CO2: 27 mEq/L (ref 22–29)
Creatinine: 0.7 mg/dL (ref 0.6–1.1)
GLUCOSE: 138 mg/dL (ref 70–140)
POTASSIUM: 3.8 meq/L (ref 3.5–5.1)
SODIUM: 141 meq/L (ref 136–145)
TOTAL PROTEIN: 5.8 g/dL — AB (ref 6.4–8.3)
Total Bilirubin: 0.4 mg/dL (ref 0.20–1.20)

## 2013-09-01 LAB — CBC WITH DIFFERENTIAL/PLATELET
BASO%: 5.7 % — ABNORMAL HIGH (ref 0.0–2.0)
BASOS ABS: 0.1 10*3/uL (ref 0.0–0.1)
EOS%: 10.2 % — ABNORMAL HIGH (ref 0.0–7.0)
Eosinophils Absolute: 0.2 10*3/uL (ref 0.0–0.5)
HCT: 32 % — ABNORMAL LOW (ref 34.8–46.6)
HGB: 10.6 g/dL — ABNORMAL LOW (ref 11.6–15.9)
LYMPH#: 0.8 10*3/uL — AB (ref 0.9–3.3)
LYMPH%: 42.6 % (ref 14.0–49.7)
MCH: 30.7 pg (ref 25.1–34.0)
MCHC: 33.1 g/dL (ref 31.5–36.0)
MCV: 92.8 fL (ref 79.5–101.0)
MONO#: 0.6 10*3/uL (ref 0.1–0.9)
MONO%: 33.5 % — AB (ref 0.0–14.0)
NEUT#: 0.1 10*3/uL — CL (ref 1.5–6.5)
NEUT%: 8 % — ABNORMAL LOW (ref 38.4–76.8)
Platelets: 465 10*3/uL — ABNORMAL HIGH (ref 145–400)
RBC: 3.45 10*6/uL — ABNORMAL LOW (ref 3.70–5.45)
RDW: 18 % — ABNORMAL HIGH (ref 11.2–14.5)
WBC: 1.8 10*3/uL — ABNORMAL LOW (ref 3.9–10.3)
nRBC: 2 % — ABNORMAL HIGH (ref 0–0)

## 2013-09-01 LAB — LACTATE DEHYDROGENASE (CC13): LDH: 149 U/L (ref 125–245)

## 2013-09-01 MED ORDER — TBO-FILGRASTIM 480 MCG/0.8ML ~~LOC~~ SOSY: 480.0000 ug | PREFILLED_SYRINGE | Freq: Once | SUBCUTANEOUS | Status: DC

## 2013-09-01 MED ORDER — OXYCODONE HCL 5 MG PO TABS
5.0000 mg | ORAL_TABLET | Freq: Four times a day (QID) | ORAL | Status: DC | PRN
Start: 2013-09-01 — End: 2014-07-07

## 2013-09-01 MED ORDER — TBO-FILGRASTIM 480 MCG/0.8ML ~~LOC~~ SOSY
480.0000 ug | PREFILLED_SYRINGE | Freq: Once | SUBCUTANEOUS | Status: AC
  Administered 2013-09-01: 480 ug via SUBCUTANEOUS
  Filled 2013-09-01: qty 0.8

## 2013-09-01 NOTE — Patient Instructions (Signed)
Neutropenia Neutropenia is a condition that occurs when the level of a certain type of white blood cell (neutrophil) in your body becomes lower than normal. Neutrophils are made in the bone marrow and fight infections. These cells protect against bacteria and viruses. The fewer neutrophils you have, and the longer your body remains without them, the greater your risk of getting a severe infection becomes. CAUSES  The cause of neutropenia may be hard to determine. However, it is usually due to 3 main problems:   Decreased production of neutrophils. This may be due to:  Certain medicines such as chemotherapy.  Genetic problems.  Cancer.  Radiation treatments.  Vitamin deficiency.  Some pesticides.  Increased destruction of neutrophils. This may be due to:  Overwhelming infections.  Hemolytic anemia. This is when the body destroys its own blood cells.  Chemotherapy.  Neutrophils moving to areas of the body where they cannot fight infections. This may be due to:  Dialysis procedures.  Conditions where the spleen becomes enlarged. Neutrophils are held in the spleen and are not available to the rest of the body.  Overwhelming infections. The neutrophils are held in the area of the infection and are not available to the rest of the body. SYMPTOMS  There are no specific symptoms of neutropenia. The lack of neutrophils can result in an infection, and an infection can cause various problems. DIAGNOSIS  Diagnosis is made by a blood test. A complete blood count is performed. The normal level of neutrophils in human blood differs with age and race. Infants have lower counts than older children and adults. African Americans have lower counts than Caucasians or Asians. The average adult level is 1500 cells/mm3 of blood. Neutrophil counts are interpreted as follows:  Greater than 1000 cells/mm3 gives normal protection against infection.  500 to 1000 cells/mm3 gives an increased risk for  infection.  200 to 500 cells/mm3 is a greater risk for severe infection.  Lower than 200 cells/mm3 is a marked risk of infection. This may require hospitalization and treatment with antibiotic medicines. TREATMENT  Treatment depends on the underlying cause, severity, and presence of infections or symptoms. It also depends on your health. Your caregiver will discuss the treatment plan with you. Mild cases are often easily treated and have a good outcome. Preventative measures may also be started to limit your risk of infections. Treatment can include:  Taking antibiotics.  Stopping medicines that are known to cause neutropenia.  Correcting nutritional deficiencies by eating green vegetables to supply folic acid and taking vitamin B supplements.  Stopping exposure to pesticides if your neutropenia is related to pesticide exposure.  Taking a blood growth factor called sargramostim, pegfilgrastim, or filgrastim if you are undergoing chemotherapy for cancer. This stimulates white blood cell production.  Removal of the spleen if you have Felty's syndrome and have repeated infections. HOME CARE INSTRUCTIONS   Follow your caregiver's instructions about when you need to have blood work done.  Wash your hands often. Make sure others who come in contact with you also wash their hands.  Wash raw fruits and vegetables before eating them. They can carry bacteria and fungi.  Avoid people with colds or spreadable (contagious) diseases (chickenpox, herpes zoster, influenza).  Avoid large crowds.  Avoid construction areas. The dust can release fungus into the air.  Be cautious around children in daycare or school environments.  Take care of your respiratory system by coughing and deep breathing.  Bathe daily.  Protect your skin from cuts and   burns.  Do not work in the garden or with flowers and plants.  Care for the mouth before and after meals by brushing with a soft toothbrush. If you have  mucositis, do not use mouthwash. Mouthwash contains alcohol and can dry out the mouth even more.  Clean the area between the genitals and the anus (perineal area) after urination and bowel movements. Women need to wipe from front to back.  Use a water soluble lubricant during sexual intercourse and practice good hygiene after. Do not have intercourse if you are severely neutropenic. Check with your caregiver for guidelines.  Exercise daily as tolerated.  Avoid people who were vaccinated with a live vaccine in the past 30 days. You should not receive live vaccines (polio, typhoid).  Do not provide direct care for pets. Avoid animal droppings. Do not clean litter boxes and bird cages.  Do not share food utensils.  Do not use tampons, enemas, or rectal suppositories unless directed by your caregiver.  Use an electric razor to remove hair.  Wash your hands after handling magazines, letters, and newspapers. SEEK IMMEDIATE MEDICAL CARE IF:   You have a fever.  You have chills or start to shake.  You feel nauseous or vomit.  You develop mouth sores.  You develop aches and pains.  You have redness and swelling around open wounds.  Your skin is warm to the touch.  You have pus coming from your wounds.  You develop swollen lymph nodes.  You feel weak or fatigued.  You develop red streaks on the skin. MAKE SURE YOU:  Understand these instructions.  Will watch your condition.  Will get help right away if you are not doing well or get worse. Document Released: 07/19/2001 Document Revised: 04/21/2011 Document Reviewed: 08/16/2010 Exit[employer] Patient Information 2015 [employer]. This information is not intended to replace advice given to you by your health care provider. Make sure you discuss any questions you have with your health care provider.  

## 2013-09-01 NOTE — Progress Notes (Signed)
[place], MD [place] [address], [place] [address]  DIAGNOSIS: Hodgkin lymphoma - Plan: CBC with Differential, Comprehensive metabolic panel (Cmet) - [facility], Lactate dehydrogenase (LDH) - [facility], CBC with Differential, Tbo-Filgrastim (GRANIX) injection 480 mcg, Tbo-Filgrastim (GRANIX) injection 480 mcg  Neutropenia, unspecified - Plan: Tbo-Filgrastim (GRANIX) injection 480 mcg, Tbo-Filgrastim (GRANIX) injection 480 mcg, Tbo-Filgrastim (GRANIX) injection 480 mcg  Drug induced neutropenia(288.03)  Chief Complaint  Patient presents with  . Lymphoma   CURRENT THERAPY:  Started AVD (without Bleomycin due to moderate restriction per PFTs) on 06/08/2013.  Cycle 1A was dose-reduced due to elevated liver function.  We will repeat PFTs following 2 cycles of AVD to consider addition of Bleomycin.   INTERVAL HISTORY: Diamond Boyd 73 y.o. 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   She was last seen by me on 08/18/2013.  She is accompanied by her husband Ray.   She tolerated her first cycle of AVD without Bleo on 06/08/2013 and had delayed count recovery requiring her to not receive AVD on 05/13.  She received neupogen on 05/13 and received cycle #1B on 05/20.  She received cycle 3A two weeks ago.  She has a rash underneath her breasts.  She reports that her bone pain improved with oxycodone 5 mg as needed.  She denies fevers or night sweats while on chemotherapy.     She denies specifically any pain, difficulty eating, trouble with her bowels, blood in the stool.     MEDICAL HISTORY: Past Medical History  Diagnosis Date  . Colon polyps   . Depression   . Hypothyroidism    . Osteoarthritis     hands  . Peripheral vascular disease   . Degenerative disk disease     spine in center in past   . 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 Cancer     skin cancer- basal cell on arm / colon 2011  . Lymphoma   . Colon cancer     08-2009    INTERIM HISTORY: has MALIGNANT NEOPLASM OF TRANSVERSE COLON; LYMPHOMA NEC, MLIG, SPLEEN; HYPERCHOLESTEROLEMIA, PURE; DISORDERS OF PHOSPHORUS METABOLISM; Anxiety state, unspecified; DEPRESSION; PERIPHERAL VASCULAR DISEASE; Other chronic sinusitis; ALLERGIC  RHINITIS; GERD; OVERACTIVE BLADDER; MENOPAUSAL SYNDROME; OSTEOARTHRITIS; LEG EDEMA; THROAT PAIN, CHRONIC; SKIN CANCER, HX OF; COLONIC POLYPS, HX OF; Elevated blood pressure; Obesity; Hypothyroid; Varicose veins; Other asplenic status; History of anemia; Screening for lipoid disorders; Other screening mammogram; Routine gynecological examination; Colon cancer screening; Skin lesion of face; Thoracic back pain; Left knee pain; Encounter for Medicare annual wellness exam; Cystocele; Herpes zoster; Vaginal pain; Other malaise and fatigue; Acute sinusitis; Left temporal headache; Hyperglycemia; Conjunctivitis, acute; Cold sore; Hodgkin lymphoma; Dysuria; Colon cancer; Fever; and Drug induced neutropenia(288.03) on her problem list.    ALLERGIES:  is allergic to achromycin; allopurinol; astelin; cephalexin; codeine; meloxicam; minocycline; nabumetone; nyquil; penicillins; sulfa antibiotics; zolpidem tartrate; buspar; and ciprofloxacin.  MEDICATIONS: has a current medication list which includes the following prescription(s): acyclovir, calcium carbonate-vitamin d, celecoxib, cholecalciferol, epipen 2-pak, estradiol, fexofenadine, fluoxetine, fluticasone, furosemide, levothyroxine, lidocaine-prilocaine, lorazepam, omega-3, ondansetron, oxycodone, prochlorperazine, psyllium, simvastatin, tobramycin-dexamethasone, vitamin e, and omeprazole, and the following Facility-Administered Medications: sodium  chloride, tbo-filgrastim, and tbo-filgrastim.  SURGICAL HISTORY:  Past Surgical History  Procedure Laterality Date  . Cholecystectomy    . Splenectomy      lymphoma  . Breast biopsy  1996  . Plantar fascia release    . Ventral hernia repair  1998  . Tendon release      Right thumb   . Colectomy  8/11  . Vaginal hysterectomy    . Bone marrow biopsy  05/27/13   PROBLEM LIST:  1. Adenocarcinoma of the colon with 2 synchronous primaries dating back to August 2011 when the patient was found to have anemia. Both tumors were associated with negative lymph nodes. One tumor was T2 N0. The other tumor was T3 N0. The T2 N0 stage I tumor was in the sigmoid colon. The T3 N0 stage II tumor was in the transverse colon. The smaller tumor measured 1.5 cm. The larger tumor measured 6.5 cm. Surgery was carried out on 09/28/2009. 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  2. History of marginal zone lymphoma probably dating back to 34. The patient had bone marrow involvement and splenomegaly at that time, also a hemolytic anemia. She underwent a splenectomy on  November 12, 1994. Through the years, the patient has received Rituxan most recently, dating back to May 2006. The patient received fludarabine in late 2002 and early 2003. She may have developed a pneumonitis from the fludarabine in December 2002. The patient has had involvement of the peripheral blood with villous lymphocytes. She appears to be in a state of complete clinical remission at this time. Tumor is CD5 and CD20 positive, also CD11c and FMC7 positive.  3. 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  4. History of lupus anticoagulant dating back to the 1990s.  5. History of iron deficiency anemia for which the patient received IV Feraheme in January 2012. Presumably, this occurred as a result of her colon cancer.  6. History of GERD resulting in cough diagnosed around 2010.  7.  Dyslipidemia.  8. Hypothyroidism.  9. Irritable bowel syndrome.  10. Goiter seen on PET scan from 09/04/2009.  11. Enlarged left tonsil diagnosed April 2011, currently resolved.  12. History of depression.   REVIEW OF SYSTEMS:   Constitutional: Denies fevers, chills or abnormal weight loss Eyes: Denies blurriness of vision Ears, nose, mouth, throat, and face: Denies mucositis or sore throat Respiratory: Denies cough, dyspnea or wheezes Cardiovascular: Denies palpitation, chest discomfort or lower extremity swelling Gastrointestinal:  Denies nausea, heartburn or change in bowel habits Skin: Denies abnormal skin rashes Lymphatics: Denies new lymphadenopathy or easy bruising Neurological:Denies numbness, tingling or new weaknesses Behavioral/Psych: Mood is stable, no new changes  All other systems were reviewed with the patient and are negative.  PHYSICAL EXAMINATION: ECOG PERFORMANCE STATUS: 1 - Symptomatic but completely ambulatory  Blood pressure 156/61, pulse 90, temperature 98.4 F (36.9 C), temperature source Oral, resp. rate 18, height _0  (1.676 m), weight 219 lb 11.2 oz (99.655 kg), last menstrual period 02/10/1970, SpO2 97.00%.  GENERAL:alert, mildly distressed but comfortable; moderately obese. Non-toxic appearing; alopecia SKIN: skin color, texture, turgor are normal, no  significant lesions; small reddish bumps on anterior frontal scalp.   EYES: normal, Conjunctiva are pink, sclera clear OROPHARYNX:no exudate, no erythema and lips, buccal mucosa, and tongue normal  NECK: supple, thyroid normal size, non-tender, without nodularity.  Left neck adenopathy resolved LYMPH:  no palpable lymphadenopathy in the cervical, axillary or supraclavicular LUNGS: clear to auscultation  and percussion with normal breathing effort HEART: regular rate & rhythm and no murmurs and no lower extremity edema ABDOMEN:abdomen soft, non-tender and normal bowel sounds; multiple surgical scars well  healed.   Musculoskeletal:no cyanosis of digits and no clubbing  NEURO: alert & oriented x 3 with fluent speech, no focal motor/sensory deficits  Labs:  Lab Results  Component Value Date   WBC 1.8* 09/01/2013   HGB 10.6* 09/01/2013   HCT 32.0* 09/01/2013   MCV 92.8 09/01/2013   PLT 465* 09/01/2013   NEUTROABS 0.1* 09/01/2013      Chemistry      Component Value Date/Time   NA 141 09/01/2013 0917   NA 131* 06/07/2013 2300   K 3.8 09/01/2013 0917   K 4.1 06/07/2013 2300   CL 92* 06/07/2013 2300   CL 105 07/14/2012 0939   CO2 27 09/01/2013 0917   CO2 21 06/07/2013 2300   BUN 7.0 09/01/2013 0917   BUN 17 06/07/2013 2300   CREATININE 0.7 09/01/2013 0917   CREATININE 0.74 06/07/2013 2300      Component Value Date/Time   CALCIUM 9.3 09/01/2013 0917   CALCIUM 9.3 06/07/2013 2300   CALCIUM 9.3 08/09/2009 0000   ALKPHOS 183* 09/01/2013 0917   ALKPHOS 529* 06/07/2013 2358   AST 32 09/01/2013 0917   AST 121* 06/07/2013 2358   ALT 28 09/01/2013 0917   ALT 46* 06/07/2013 2358   BILITOT 0.40 09/01/2013 0917   BILITOT 2.5* 06/07/2013 2358     IMAGING STUDIES:  1. Digital screening mammogram from 08/05/2010 was negative.  2. Chest x-ray, 2 view, from 12/09/2010 was negative.  3. Digital screening mammogram on 08/20/2011 showed no specific evidence of malignancy  4. Chest x-ray, 2 view, from 11/19/2011 showed atherosclerotic calcifications within the arch of the aorta. There was evidence of a prior cholecystectomy and splenectomy, otherwise chest x-ray was negative.  5. CT scan of abdomen and pelvis with IV contrast obtained on 01/21/2012 showed no clear evidence of colon cancer metastasis. A retrocrural lymph node had increased in size but periportal adenopathy and periaortic adenopathy was stable to decreased. The retrocrural lymph node refer to measures 8 mm on image 12, increased from 5 mm on a prior study.  6. Chest x-ray, 2 view, from 06/01/2012 showed no acute abnormalities. There was borderline enlargement  of the cardiac silhouette.  7. Chest x-ray, 2 view, from 03/09/2013, showed new pulmonary nodules in the right lung. Cannot rule out  metastatic disease. Correlation with CT recommended. (reviewed by me) 8. CT scan of abdomen and pelvis with IV contrast on 03/09/2013 showed small right lower lobe pulmonary nodules measuring up to 8 mm, new, suspicious for metastases. Small right juxtadiaphragmatic, upper abdominal, retroperitoneal, and left pelvic lymph nodes, stable versus mildly increased. Prior splenectomy, cholecystectomy, and right hemicolectomy. PET-CT is suggested for further evaluation.  (reviewed by me) 9. PET/CT on 03/21/2013 revealed intensely hypermetabolic nodule within the left lobe of thyroid gland. Concern for thyroid carcinoma. 2. Two intensely hypermetabolic right lower lobe pulmonary nodules. Differential includes thyroid cancer metastasis or colon cancer metastasis. 3. Intense uptake within the right sacrum is concerning metastasis. Milder abnormal uptake within the T9 vertebral body is indeterminate but concerning for metastasis. 10. PET on 08/17/2013 showed an interval decrease in size and hypermetabolism of the right lower lobe pulmonary nodule seen on the previous study, consistent with interval response to therapy.  PROCEDURES:  Colonoscopy was carried out by Dr. Erskine Emery on 10/10/2010.  IMAGING: None  PATHOLOGY:  Lung, needle/core biopsy(ies), RLL sup seg nodule pet+ - MALIGNANT LYMPHORETICULAR NEOPLASM, MOST CONSISTENT WITH CLASSICAL HODGKIN LYMPHOMA. - SEE COMMENT. Diagnosis Note The core biopsies reveal a diffuse lymphohistiocytic infiltrate. There are scattered Hodgkin Reed-Sternberg like cells with prominent nuclei. There are rare lacunar like cells. There are no significant eosinophils or plasma cells seen in the background. Immunohistochemistry reveals scattered CD30 positive cells with golgi staining. The cells are also positive for CD15 and PAX-5. CD45 interpretation  is hampered by diffuse background T-cells. CD3 and CD43 highlight abundant small T-cells. Only scattered B-cells are seen with CD79a and CD20. EBV in situ (EBER) is negative. CDX-2, cytokeratin 7 and cytokeratin 20 are negative for carcinoma. Overall, these findings are consistent with extranodal pulmonary involvement by classical Hodgkin lymphoma. The case was sent to [facility] for consultation (report [phone]), who agrees with the above interpretation. Dr. Juliann Mule was paged on 05/19/2013.  05/26/2013  Bone Marrow, Aspirate,Biopsy, and Clot, right iliac - HYPERCELLULAR BONE MARROW WITH ATYPICAL LYMPHOHISTIOCYTIC PROLIFERATION. - TRILINEAGE HEMATOPOIESIS.- SEE COMMENT.PERIPHERAL BLOOD:- NORMOCYTIC-NORMOCHROMIC ANEMIA. - MONOCYTOSIS.Diagnosis Note The aspirate material is extremely limited for morphologic evaluation. However, the core biopsy shows numerous variably sized and ill-defined interstitial and paratrabecular atypical lymphohistiocytic aggregates mostly composed of small lymphocytes and histiocytes in addition to scattering of larger CD30 positive lymphoid appearing cells. The histologic and phenotypic features are similar to previously diagnosed classical Hodgkin's lymphoma ([id number]) and consistent with marrow involvement by the same disease process. (BNS:ecj/gt 05/27/2013)BASSAM SMIR MD Pathologist, Electronic Signature (Case signed 05/31/2013)  ASSESSMENT: Diamond Boyd 73 y.o. female with a history of Hodgkin lymphoma - Plan: CBC with Differential, Comprehensive metabolic panel (Cmet) - [facility], Lactate dehydrogenase (LDH) - [facility], CBC with Differential, Tbo-Filgrastim (GRANIX) injection 480 mcg, Tbo-Filgrastim (GRANIX) injection 480 mcg  Neutropenia, unspecified - Plan: Tbo-Filgrastim (GRANIX) injection 480 mcg, Tbo-Filgrastim (GRANIX) injection 480 mcg, Tbo-Filgrastim (GRANIX) injection 480 mcg  Drug induced neutropenia(288.03) now with Hodgkins lymphoma with B  symptoms including (Fevers, night sweats and weight lost).   PLAN:  1. Chemotherapy-induced neutropenia. --Based on an [place] of 100, we will hold her chemotherapy today and reschedule for 09/08/2013 and give her granix 480 mcg x 3 days.   We switched her neulasta to granix on prior regiments to avoid the severe bone pains and arthralgias; however, the neulasta promoted greater count recovery.  We will therefore switch back to neulasta.   2. H.o of Marginal zone lymphoma now with multiple lung nodules consistent with Hodgkin's lymphoma (Stage IV with bone marrow involvement) with B symptoms.   --Previously, we had an extensive discussion and review of her pulmonary pathology and final report conclusive for classical hodgkin's disease with bone marrow invovlement.  She reported fevers night sweats which would be considered a constitutional B symptoms. Her ESR is 84 (high).  Based on advanced lymphoma, she received a bone marrow biopsy which demonstrated involvement of Hodgkin's lymphoma.  Her echo also was obtained demonstrating a normal EF.  She received a R port-a-cath.   Her PFTs  demonstrated a moderate restriction (likely secondary to presence of the disease).   --We started chemotherapy with AVD x 6 cycles (category 2A) q28 days (doxorubicin, vinblastine, and dacarbazine) (Duggan DB, J[employer], 2003).  Based on moderate restriction component of her PFTs, we will held bleomycin.     Day 1, Cycle 3B 09/08/2013  Doxorubicin (Adriamycin) 25 mg/m2  Vinblastine 6 mg/m2  Dacarbazine 375 mg/m2   -She will have Day1, Cycle 3B on 07/30.  We will give neulasta on Day #2 of each cycle due to delayed count recovery. We obtained a restaging PET-CT following Cycle #2 (NCCN Guidelines, Version 2.2014, HODG-9) to assess response and further treatment based on [place] criteria for response. It demonstrated an interval decrease in size and hypermetabolism of the right lower lobe pulmonary nodule seen on the previous  study, consistent with interval response to therapy.  --Her LDH and ESR has normalized.  Her creatinine is normal. Her calcium and serum potassium is normal. She was given anti-emetics and in addition we continue acyclovir 500 mg bid prophylaxis.   She has a listed allergy to allopurinol.   3. Colon cancer.   --R[place] remains clinically stable. Her last colonoscopy was on  10/10/2010 by Dr. Deatra Ina.  We will make Dr. Deatra Ina of aware. CEA is stable at 1.9.     4. Thyroid nodule.    --Her PET/CT was also concerning for thyroid carcinoma. TSH on 01/04/2013 was 0.51.  Biopsy was negative for malignancy.   5. Right eye irritation, resolved -- She is being followed by dermatology and opthamology.  She reports substantial improvement.   6. Skin irritation beneath fold of breast and pannus. --Counseled to continue anti-fungal powder and keep area as dry as possible.   7. Follow-up.  --We will plan to see [place] again in 3 weeks for a brief symptom visit and for consideration of AVD Cycle 4A.   All questions were answered. The patient knows to call the clinic with any problems, questions or concerns. We can certainly see the patient much sooner if necessary.  I spent 15 minutes counseling the patient face to face. The total time spent in the appointment was 25 minutes.    Roselee Tayloe, MD 09/01/2013 10:22 AM

## 2013-09-01 NOTE — Telephone Encounter (Signed)
[facility] APPT SCHEDULE FOR JULY/AUG

## 2013-09-02 ENCOUNTER — Ambulatory Visit (HOSPITAL_BASED_OUTPATIENT_CLINIC_OR_DEPARTMENT_OTHER): Payer: Medicare Other

## 2013-09-02 VITALS — BP 131/79 | HR 98 | Temp 98.6°F

## 2013-09-02 DIAGNOSIS — D702 Other drug-induced agranulocytosis: Secondary | ICD-10-CM

## 2013-09-02 DIAGNOSIS — C8190 Hodgkin lymphoma, unspecified, unspecified site: Secondary | ICD-10-CM | POA: Diagnosis not present

## 2013-09-02 MED ORDER — TBO-FILGRASTIM 480 MCG/0.8ML ~~LOC~~ SOSY
480.0000 ug | PREFILLED_SYRINGE | Freq: Once | SUBCUTANEOUS | Status: AC
  Administered 2013-09-02: 480 ug via SUBCUTANEOUS
  Filled 2013-09-02: qty 0.8

## 2013-09-03 ENCOUNTER — Ambulatory Visit (HOSPITAL_BASED_OUTPATIENT_CLINIC_OR_DEPARTMENT_OTHER): Payer: Medicare Other

## 2013-09-03 VITALS — BP 146/60 | HR 93 | Temp 99.0°F | Resp 16

## 2013-09-03 DIAGNOSIS — D702 Other drug-induced agranulocytosis: Secondary | ICD-10-CM

## 2013-09-03 DIAGNOSIS — C8190 Hodgkin lymphoma, unspecified, unspecified site: Secondary | ICD-10-CM | POA: Diagnosis not present

## 2013-09-03 MED ORDER — TBO-FILGRASTIM 480 MCG/0.8ML ~~LOC~~ SOSY
480.0000 ug | PREFILLED_SYRINGE | Freq: Once | SUBCUTANEOUS | Status: AC
  Administered 2013-09-03: 480 ug via SUBCUTANEOUS

## 2013-09-03 NOTE — Progress Notes (Signed)
Pt states she ran fever 101 @ [facility] last eve & called & nurse told her to go to the ED but she reports that her temp started going down & she did not go.  Temp normal this am when she got up & temp 99 here at office.  Informed to call MD if temp goes back up 100.5 or greater & explained that she could have an infection with her WBC/ANC being low.  She was also informed to watch for any signs or symptoms of infection which she denies at this point.  She & husband express understanding of instructions.

## 2013-09-05 ENCOUNTER — Telehealth: Payer: Self-pay | Admitting: *Deleted

## 2013-09-05 NOTE — Telephone Encounter (Signed)
Received call from husband stating that pt had fever all weekend.  Spoke with pt and was informed that pt had increased temp since Friday after chemo.  Pt/husband called Nurse on Call on Fri. And was instructed to go to ER for further evaluation; however, pt did not go, and took Tylenol instead.  Pt/husband stated pt had increased temp on Sat. And Sun.  Pt received Granix injection on Sat. 7/25.  Per husband, pt had temp of 100.8 yesterday, and took Tylenol.   Pt stated she did not have fever today.  Denied coughing, denied chest congestion - stated she was feeling fine today.   Message to Dr. Juliann Mule. Pt's  Phone   [phone].

## 2013-09-08 ENCOUNTER — Other Ambulatory Visit (HOSPITAL_BASED_OUTPATIENT_CLINIC_OR_DEPARTMENT_OTHER): Payer: Medicare Other

## 2013-09-08 ENCOUNTER — Ambulatory Visit (HOSPITAL_BASED_OUTPATIENT_CLINIC_OR_DEPARTMENT_OTHER): Payer: Medicare Other

## 2013-09-08 ENCOUNTER — Other Ambulatory Visit: Payer: Self-pay | Admitting: Internal Medicine

## 2013-09-08 VITALS — BP 135/70 | HR 82 | Temp 98.0°F | Resp 16

## 2013-09-08 DIAGNOSIS — Z5111 Encounter for antineoplastic chemotherapy: Secondary | ICD-10-CM | POA: Diagnosis not present

## 2013-09-08 DIAGNOSIS — C8190 Hodgkin lymphoma, unspecified, unspecified site: Secondary | ICD-10-CM | POA: Diagnosis not present

## 2013-09-08 LAB — CBC WITH DIFFERENTIAL/PLATELET
BASO%: 1.1 % (ref 0.0–2.0)
BASOS ABS: 0.1 10*3/uL (ref 0.0–0.1)
EOS%: 2.8 % (ref 0.0–7.0)
Eosinophils Absolute: 0.3 10*3/uL (ref 0.0–0.5)
HCT: 35.5 % (ref 34.8–46.6)
HGB: 11.7 g/dL (ref 11.6–15.9)
LYMPH%: 12.1 % — ABNORMAL LOW (ref 14.0–49.7)
MCH: 30.5 pg (ref 25.1–34.0)
MCHC: 33 g/dL (ref 31.5–36.0)
MCV: 92.4 fL (ref 79.5–101.0)
MONO#: 3.3 10*3/uL — ABNORMAL HIGH (ref 0.1–0.9)
MONO%: 31.8 % — AB (ref 0.0–14.0)
NEUT#: 5.4 10*3/uL (ref 1.5–6.5)
NEUT%: 52.2 % (ref 38.4–76.8)
Platelets: 187 10*3/uL (ref 145–400)
RBC: 3.84 10*6/uL (ref 3.70–5.45)
RDW: 18.4 % — AB (ref 11.2–14.5)
WBC: 10.3 10*3/uL (ref 3.9–10.3)
lymph#: 1.3 10*3/uL (ref 0.9–3.3)
nRBC: 3 % — ABNORMAL HIGH (ref 0–0)

## 2013-09-08 MED ORDER — ONDANSETRON 16 MG/50ML IVPB (CHCC)
16.0000 mg | Freq: Once | INTRAVENOUS | Status: AC
  Administered 2013-09-08: 16 mg via INTRAVENOUS

## 2013-09-08 MED ORDER — VINBLASTINE SULFATE CHEMO INJECTION 1 MG/ML
6.0000 mg/m2 | Freq: Once | INTRAVENOUS | Status: AC
  Administered 2013-09-08: 13 mg via INTRAVENOUS
  Filled 2013-09-08: qty 13

## 2013-09-08 MED ORDER — ONDANSETRON 16 MG/50ML IVPB (CHCC)
INTRAVENOUS | Status: AC
  Filled 2013-09-08: qty 16

## 2013-09-08 MED ORDER — SODIUM CHLORIDE 0.9 % IJ SOLN
10.0000 mL | INTRAMUSCULAR | Status: DC | PRN
  Administered 2013-09-08: 10 mL
  Filled 2013-09-08: qty 10

## 2013-09-08 MED ORDER — DEXAMETHASONE SODIUM PHOSPHATE 20 MG/5ML IJ SOLN
20.0000 mg | Freq: Once | INTRAMUSCULAR | Status: AC
  Administered 2013-09-08: 20 mg via INTRAVENOUS

## 2013-09-08 MED ORDER — SODIUM CHLORIDE 0.9 % IV SOLN
Freq: Once | INTRAVENOUS | Status: AC
  Administered 2013-09-08: 12:00:00 via INTRAVENOUS

## 2013-09-08 MED ORDER — DOXORUBICIN HCL CHEMO IV INJECTION 2 MG/ML
25.0000 mg/m2 | Freq: Once | INTRAVENOUS | Status: AC
  Administered 2013-09-08: 54 mg via INTRAVENOUS
  Filled 2013-09-08: qty 27

## 2013-09-08 MED ORDER — SODIUM CHLORIDE 0.9 % IV SOLN
800.0000 mg | Freq: Once | INTRAVENOUS | Status: AC
  Administered 2013-09-08: 800 mg via INTRAVENOUS
  Filled 2013-09-08: qty 40

## 2013-09-08 MED ORDER — HEPARIN SOD (PORK) LOCK FLUSH 100 UNIT/ML IV SOLN
500.0000 [IU] | Freq: Once | INTRAVENOUS | Status: AC | PRN
  Administered 2013-09-08: 500 [IU]
  Filled 2013-09-08: qty 5

## 2013-09-08 MED ORDER — DEXAMETHASONE SODIUM PHOSPHATE 20 MG/5ML IJ SOLN
INTRAMUSCULAR | Status: AC
  Filled 2013-09-08: qty 5

## 2013-09-08 NOTE — Progress Notes (Signed)
Per Dr. Juliann Mule, CMET from 09/01/13 okay to use for treatment 09/09/13.  SLJ

## 2013-09-08 NOTE — Patient Instructions (Signed)
Dacarbazine, DTIC injection What is this medicine? DACARBAZINE (da KAR ba zeen) is a chemotherapy drug. This medicine is used to treat skin cancer. It is also used with other medicines to treat Hodgkin's disease. This medicine may be used for other purposes; ask your health care provider or pharmacist if you have questions. COMMON BRAND NAME(S): DTIC-Dome What should I tell my health care provider before I take this medicine? They need to know if you have any of these conditions: -infection (especially virus infection such as chickenpox, cold sores, or herpes) -kidney disease -liver disease -low blood counts like low platelets, red blood cells, white blood cells -recent radiation therapy -an unusual or allergic reaction to dacarbazine, other chemotherapy agents, other medicines, foods, dyes, or preservatives -pregnant or trying to get pregnant -breast-feeding How should I use this medicine? This drug is given as an injection or infusion into a vein. It is administered in a hospital or clinic by a specially trained health care professional. Talk to your pediatrician regarding the use of this medicine in children. While this drug may be prescribed for selected conditions, precautions do apply. Overdosage: If you think you have taken too much of this medicine contact a poison control center or emergency room at once. NOTE: This medicine is only for you. Do not share this medicine with others. What if I miss a dose? It is important not to miss your dose. Call your doctor or health care professional if you are unable to keep an appointment. What may interact with this medicine? -medicines to increase blood counts like filgrastim, pegfilgrastim, sargramostim -vaccines This list may not describe all possible interactions. Give your health care provider a list of all the medicines, herbs, non-prescription drugs, or dietary supplements you use. Also tell them if you smoke, drink alcohol, or use  illegal drugs. Some items may interact with your medicine. What should I watch for while using this medicine? Your condition will be monitored carefully while you are receiving this medicine. You will need important blood work done while you are taking this medicine. This drug may make you feel generally unwell. This is not uncommon, as chemotherapy can affect healthy cells as well as cancer cells. Report any side effects. Continue your course of treatment even though you feel ill unless your doctor tells you to stop. Call your doctor or health care professional for advice if you get a fever, chills or sore throat, or other symptoms of a cold or flu. Do not treat yourself. This drug decreases your body's ability to fight infections. Try to avoid being around people who are sick. This medicine may increase your risk to bruise or bleed. Call your doctor or health care professional if you notice any unusual bleeding. Be careful brushing and flossing your teeth or using a toothpick because you may get an infection or bleed more easily. If you have any dental work done, tell your dentist you are receiving this medicine. Avoid taking products that contain aspirin, acetaminophen, ibuprofen, naproxen, or ketoprofen unless instructed by your doctor. These medicines may hide a fever. Do not become pregnant while taking this medicine. Women should inform their doctor if they wish to become pregnant or think they might be pregnant. There is a potential for serious side effects to an unborn child. Talk to your health care professional or pharmacist for more information. Do not breast-feed an infant while taking this medicine. What side effects may I notice from receiving this medicine? Side effects that you should report  to your doctor or health care professional as soon as possible: -allergic reactions like skin rash, itching or hives, swelling of the face, lips, or tongue -low blood counts - this medicine may  decrease the number of white blood cells, red blood cells and platelets. You may be at increased risk for infections and bleeding. -signs of infection - fever or chills, cough, sore throat, pain or difficulty passing urine -signs of decreased platelets or bleeding - bruising, pinpoint red spots on the skin, black, tarry stools, blood in the urine -signs of decreased red blood cells - unusually weak or tired, fainting spells, lightheadedness -breathing problems -muscle pains -pain at site where injected -trouble passing urine or change in the amount of urine -vomiting -yellowing of the eyes or skin Side effects that usually do not require medical attention (report to your doctor or health care professional if they continue or are bothersome): -diarrhea -hair loss -loss of appetite -nausea -skin more sensitive to sun or ultraviolet light -stomach upset This list may not describe all possible side effects. Call your doctor for medical advice about side effects. You may report side effects to FDA at 1-800-FDA-1088. Where should I keep my medicine? This drug is given in a hospital or clinic and will not be stored at home. NOTE: This sheet is a summary. It may not cover all possible information. If you have questions about this medicine, talk to your doctor, pharmacist, or health care provider.  2015, [employer]. (2007-05-18 16:56:39) Vinblastine injection What is this medicine? VINBLASTINE (vin BLAS teen) is a chemotherapy drug. It slows the growth of cancer cells. This medicine is used to treat many types of cancer like breast cancer, testicular cancer, Hodgkin's disease, non-Hodgkin's lymphoma, and sarcoma. This medicine may be used for other purposes; ask your health care provider or pharmacist if you have questions. COMMON BRAND NAME(S): Velban What should I tell my health care provider before I take this medicine? They need to know if you have any of these conditions: -blood  disorders -dental disease -gout -infection (especially a virus infection such as chickenpox, cold sores, or herpes) -liver disease -lung disease -nervous system disease -recent or ongoing radiation therapy -an unusual or allergic reaction to vinblastine, other chemotherapy agents, other medicines, foods, dyes, or preservatives -pregnant or trying to get pregnant -breast-feeding How should I use this medicine? This drug is given as an infusion into a vein. It is administered in a hospital or clinic by a specially trained health care professional. If you have pain, swelling, burning or any unusual feeling around the site of your injection, tell your health care professional right away. Talk to your pediatrician regarding the use of this medicine in children. While this drug may be prescribed for selected conditions, precautions do apply. Overdosage: If you think you have taken too much of this medicine contact a poison control center or emergency room at once. NOTE: This medicine is only for you. Do not share this medicine with others. What if I miss a dose? It is important not to miss your dose. Call your doctor or health care professional if you are unable to keep an appointment. What may interact with this medicine? Do not take this medicine with any of the following medications: -erythromycin -itraconazole -mibefradil -voriconazole This medicine may also interact with the following medications: -cyclosporine -fluconazole -ketoconazole -medicines for seizures like phenytoin -medicines to increase blood counts like filgrastim, pegfilgrastim, sargramostim -vaccines -verapamil Talk to your doctor or health care professional before taking  any of these medicines: -acetaminophen -aspirin -ibuprofen -ketoprofen -naproxen This list may not describe all possible interactions. Give your health care provider a list of all the medicines, herbs, non-prescription drugs, or dietary  supplements you use. Also tell them if you smoke, drink alcohol, or use illegal drugs. Some items may interact with your medicine. What should I watch for while using this medicine? Your condition will be monitored carefully while you are receiving this medicine. You will need important blood work done while you are taking this medicine. This drug may make you feel generally unwell. This is not uncommon, as chemotherapy can affect healthy cells as well as cancer cells. Report any side effects. Continue your course of treatment even though you feel ill unless your doctor tells you to stop. In some cases, you may be given additional medicines to help with side effects. Follow all directions for their use. Call your doctor or health care professional for advice if you get a fever, chills or sore throat, or other symptoms of a cold or flu. Do not treat yourself. This drug decreases your body's ability to fight infections. Try to avoid being around people who are sick. This medicine may increase your risk to bruise or bleed. Call your doctor or health care professional if you notice any unusual bleeding. Be careful brushing and flossing your teeth or using a toothpick because you may get an infection or bleed more easily. If you have any dental work done, tell your dentist you are receiving this medicine. Avoid taking products that contain aspirin, acetaminophen, ibuprofen, naproxen, or ketoprofen unless instructed by your doctor. These medicines may hide a fever. Do not become pregnant while taking this medicine. Women should inform their doctor if they wish to become pregnant or think they might be pregnant. There is a potential for serious side effects to an unborn child. Talk to your health care professional or pharmacist for more information. Do not breast-feed an infant while taking this medicine. Men may have a lower sperm count while taking this medicine. Talk to your doctor if you plan to father a  child. What side effects may I notice from receiving this medicine? Side effects that you should report to your doctor or health care professional as soon as possible: -allergic reactions like skin rash, itching or hives, swelling of the face, lips, or tongue -low blood counts - This drug may decrease the number of white blood cells, red blood cells and platelets. You may be at increased risk for infections and bleeding. -signs of infection - fever or chills, cough, sore throat, pain or difficulty passing urine -signs of decreased platelets or bleeding - bruising, pinpoint red spots on the skin, black, tarry stools, nosebleeds -signs of decreased red blood cells - unusually weak or tired, fainting spells, lightheadedness -breathing problems -changes in hearing -change in the amount of urine -chest pain -high blood pressure -mouth sores -nausea and vomiting -pain, swelling, redness or irritation at the injection site -pain, tingling, numbness in the hands or feet -problems with balance, dizziness -seizures Side effects that usually do not require medical attention (report to your doctor or health care professional if they continue or are bothersome): -constipation -hair loss -jaw pain -loss of appetite -sensitivity to light -stomach pain -tumor pain This list may not describe all possible side effects. Call your doctor for medical advice about side effects. You may report side effects to FDA at 1-800-FDA-1088. Where should I keep my medicine? This drug is given in  a hospital or clinic and will not be stored at home. NOTE: This sheet is a summary. It may not cover all possible information. If you have questions about this medicine, talk to your doctor, pharmacist, or health care provider.  2015, [employer]. (2007-10-25 17:15:59) Doxorubicin injection What is this medicine? DOXORUBICIN (dox oh ROO bi sin) is a chemotherapy drug. It is used to treat many kinds of cancer  like Hodgkin's disease, leukemia, non-Hodgkin's lymphoma, neuroblastoma, sarcoma, and Wilms' tumor. It is also used to treat bladder cancer, breast cancer, lung cancer, ovarian cancer, stomach cancer, and thyroid cancer. This medicine may be used for other purposes; ask your health care provider or pharmacist if you have questions. COMMON BRAND NAME(S): Adriamycin, Adriamycin PFS, Adriamycin RDF, Rubex What should I tell my health care provider before I take this medicine? They need to know if you have any of these conditions: -blood disorders -heart disease, recent heart attack -infection (especially a virus infection such as chickenpox, cold sores, or herpes) -irregular heartbeat -liver disease -recent or ongoing radiation therapy -an unusual or allergic reaction to doxorubicin, other chemotherapy agents, other medicines, foods, dyes, or preservatives -pregnant or trying to get pregnant -breast-feeding How should I use this medicine? This drug is given as an infusion into a vein. It is administered in a hospital or clinic by a specially trained health care professional. If you have pain, swelling, burning or any unusual feeling around the site of your injection, tell your health care professional right away. Talk to your pediatrician regarding the use of this medicine in children. Special care may be needed. Overdosage: If you think you have taken too much of this medicine contact a poison control center or emergency room at once. NOTE: This medicine is only for you. Do not share this medicine with others. What if I miss a dose? It is important not to miss your dose. Call your doctor or health care professional if you are unable to keep an appointment. What may interact with this medicine? Do not take this medicine with any of the following medications: -cisapride -droperidol -halofantrine -pimozide -zidovudine This medicine may also interact with the following  medications: -chloroquine -chlorpromazine -clarithromycin -cyclophosphamide -cyclosporine -erythromycin -medicines for depression, anxiety, or psychotic disturbances -medicines for irregular heart beat like amiodarone, bepridil, dofetilide, encainide, flecainide, propafenone, quinidine -medicines for seizures like ethotoin, fosphenytoin, phenytoin -medicines for nausea, vomiting like dolasetron, ondansetron, palonosetron -medicines to increase blood counts like filgrastim, pegfilgrastim, sargramostim -methadone -methotrexate -pentamidine -progesterone -vaccines -verapamil Talk to your doctor or health care professional before taking any of these medicines: -acetaminophen -aspirin -ibuprofen -ketoprofen -naproxen This list may not describe all possible interactions. Give your health care provider a list of all the medicines, herbs, non-prescription drugs, or dietary supplements you use. Also tell them if you smoke, drink alcohol, or use illegal drugs. Some items may interact with your medicine. What should I watch for while using this medicine? Your condition will be monitored carefully while you are receiving this medicine. You will need important blood work done while you are taking this medicine. This drug may make you feel generally unwell. This is not uncommon, as chemotherapy can affect healthy cells as well as cancer cells. Report any side effects. Continue your course of treatment even though you feel ill unless your doctor tells you to stop. Your urine may turn red for a few days after your dose. This is not blood. If your urine is dark or brown, call your doctor. In some  cases, you may be given additional medicines to help with side effects. Follow all directions for their use. Call your doctor or health care professional for advice if you get a fever, chills or sore throat, or other symptoms of a cold or flu. Do not treat yourself. This drug decreases your body's ability to  fight infections. Try to avoid being around people who are sick. This medicine may increase your risk to bruise or bleed. Call your doctor or health care professional if you notice any unusual bleeding. Be careful brushing and flossing your teeth or using a toothpick because you may get an infection or bleed more easily. If you have any dental work done, tell your dentist you are receiving this medicine. Avoid taking products that contain aspirin, acetaminophen, ibuprofen, naproxen, or ketoprofen unless instructed by your doctor. These medicines may hide a fever. Men and women of childbearing age should use effective birth control methods while using taking this medicine. Do not become pregnant while taking this medicine. There is a potential for serious side effects to an unborn child. Talk to your health care professional or pharmacist for more information. Do not breast-feed an infant while taking this medicine. Do not let others touch your urine or other body fluids for 5 days after each treatment with this medicine. Caregivers should wear latex gloves to avoid touching body fluids during this time. There is a maximum amount of this medicine you should receive throughout your life. The amount depends on the medical condition being treated and your overall health. Your doctor will watch how much of this medicine you receive in your lifetime. Tell your doctor if you have taken this medicine before. What side effects may I notice from receiving this medicine? Side effects that you should report to your doctor or health care professional as soon as possible: -allergic reactions like skin rash, itching or hives, swelling of the face, lips, or tongue -low blood counts - this medicine may decrease the number of white blood cells, red blood cells and platelets. You may be at increased risk for infections and bleeding. -signs of infection - fever or chills, cough, sore throat, pain or difficulty passing  urine -signs of decreased platelets or bleeding - bruising, pinpoint red spots on the skin, black, tarry stools, blood in the urine -signs of decreased red blood cells - unusually weak or tired, fainting spells, lightheadedness -breathing problems -chest pain -fast, irregular heartbeat -mouth sores -nausea, vomiting -pain, swelling, redness at site where injected -pain, tingling, numbness in the hands or feet -swelling of ankles, feet, or hands -unusual bleeding or bruising Side effects that usually do not require medical attention (report to your doctor or health care professional if they continue or are bothersome): -diarrhea -facial flushing -hair loss -loss of appetite -missed menstrual periods -nail discoloration or damage -red or watery eyes -red colored urine -stomach upset This list may not describe all possible side effects. Call your doctor for medical advice about side effects. You may report side effects to FDA at 1-800-FDA-1088. Where should I keep my medicine? This drug is given in a hospital or clinic and will not be stored at home. NOTE: This sheet is a summary. It may not cover all possible information. If you have questions about this medicine, talk to your doctor, pharmacist, or health care provider.  2015, [employer]. (2012-05-25 09:54:34)

## 2013-09-09 ENCOUNTER — Other Ambulatory Visit: Payer: Self-pay | Admitting: Internal Medicine

## 2013-09-09 ENCOUNTER — Ambulatory Visit (HOSPITAL_BASED_OUTPATIENT_CLINIC_OR_DEPARTMENT_OTHER): Payer: Medicare Other

## 2013-09-09 VITALS — BP 136/69 | HR 79 | Temp 98.2°F

## 2013-09-09 DIAGNOSIS — D702 Other drug-induced agranulocytosis: Secondary | ICD-10-CM

## 2013-09-09 DIAGNOSIS — C8190 Hodgkin lymphoma, unspecified, unspecified site: Secondary | ICD-10-CM

## 2013-09-09 MED ORDER — PEGFILGRASTIM INJECTION 6 MG/0.6ML
6.0000 mg | Freq: Once | SUBCUTANEOUS | Status: AC
  Administered 2013-09-09: 6 mg via SUBCUTANEOUS
  Filled 2013-09-09: qty 0.6

## 2013-09-12 ENCOUNTER — Telehealth: Payer: Self-pay | Admitting: Family Medicine

## 2013-09-12 DIAGNOSIS — E7800 Pure hypercholesterolemia, unspecified: Secondary | ICD-10-CM

## 2013-09-12 DIAGNOSIS — R609 Edema, unspecified: Secondary | ICD-10-CM

## 2013-09-12 DIAGNOSIS — E038 Other specified hypothyroidism: Secondary | ICD-10-CM

## 2013-09-12 DIAGNOSIS — IMO0001 Reserved for inherently not codable concepts without codable children: Secondary | ICD-10-CM

## 2013-09-12 DIAGNOSIS — Z13220 Encounter for screening for lipoid disorders: Secondary | ICD-10-CM

## 2013-09-12 DIAGNOSIS — R030 Elevated blood-pressure reading, without diagnosis of hypertension: Secondary | ICD-10-CM

## 2013-09-12 DIAGNOSIS — R739 Hyperglycemia, unspecified: Secondary | ICD-10-CM

## 2013-09-12 NOTE — Telephone Encounter (Signed)
Message copied by Abner Greenspan on Mon Sep 12, 2013  8:06 AM ------      [name]      Created: Wed Sep 07, 2013 11:31 AM      Regarding: Lab orders for Friday, 8.7.15       Patient is scheduled for CPX labs, please order future labs, Thanks , Diamond Boyd       ------

## 2013-09-13 ENCOUNTER — Encounter: Payer: Self-pay | Admitting: Internal Medicine

## 2013-09-13 ENCOUNTER — Ambulatory Visit (INDEPENDENT_AMBULATORY_CARE_PROVIDER_SITE_OTHER): Payer: Medicare Other | Admitting: Internal Medicine

## 2013-09-13 VITALS — BP 150/60 | HR 104 | Temp 98.3°F | Wt 214.0 lb

## 2013-09-13 DIAGNOSIS — B372 Candidiasis of skin and nail: Secondary | ICD-10-CM | POA: Diagnosis not present

## 2013-09-13 MED ORDER — NYSTATIN 100000 UNIT/GM EX POWD: Freq: Four times a day (QID) | CUTANEOUS | Status: DC
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
## 2013-09-13 NOTE — Patient Instructions (Signed)
Yeast Infection of the Skin Some yeast on the skin is normal, but sometimes it causes an infection. If you have a yeast infection, it shows up as white or light brown patches on brown skin. You can see it better in the summer on tan skin. It causes light-colored holes in your suntan. It can happen on any area of the body. This cannot be passed from person to person. HOME CARE  Scrub your skin daily with a dandruff shampoo. Your rash may take a couple weeks to get well.  Do not scratch or itch the rash. GET HELP RIGHT AWAY IF:   You get another infection from scratching. The skin may get warm, red, and may ooze fluid.  The infection does not seem to be getting better. MAKE SURE YOU:  Understand these instructions.  Will watch your condition.  Will get help right away if you are not doing well or get worse. Document Released: 01/10/2008 Document Revised: 04/21/2011 Document Reviewed: 01/10/2008 [employer] Patient Information 2015 [employer]. This information is not intended to replace advice given to you by your health care provider. Make sure you discuss any questions you have with your health care provider.  

## 2013-09-13 NOTE — Progress Notes (Signed)
Pre visit review using our clinic review tool, if applicable. No additional management support is needed unless otherwise documented below in the visit note. 

## 2013-09-15 ENCOUNTER — Ambulatory Visit: Payer: BLUE CROSS/BLUE SHIELD

## 2013-09-16 ENCOUNTER — Ambulatory Visit: Payer: BLUE CROSS/BLUE SHIELD

## 2013-09-16 ENCOUNTER — Other Ambulatory Visit (INDEPENDENT_AMBULATORY_CARE_PROVIDER_SITE_OTHER): Payer: Medicare Other

## 2013-09-16 ENCOUNTER — Other Ambulatory Visit: Payer: Self-pay | Admitting: Family Medicine

## 2013-09-16 DIAGNOSIS — E038 Other specified hypothyroidism: Secondary | ICD-10-CM | POA: Diagnosis not present

## 2013-09-16 DIAGNOSIS — R7309 Other abnormal glucose: Secondary | ICD-10-CM | POA: Diagnosis not present

## 2013-09-16 DIAGNOSIS — E7800 Pure hypercholesterolemia, unspecified: Secondary | ICD-10-CM

## 2013-09-16 DIAGNOSIS — R739 Hyperglycemia, unspecified: Secondary | ICD-10-CM

## 2013-09-16 LAB — HEMOGLOBIN A1C: Hgb A1c MFr Bld: 7.3 % — ABNORMAL HIGH (ref 4.6–6.5)

## 2013-09-16 LAB — TSH: TSH: 2.53 u[IU]/mL (ref 0.35–4.50)

## 2013-09-16 LAB — LIPID PANEL
CHOLESTEROL: 160 mg/dL (ref 0–200)
HDL: 45.2 mg/dL (ref >39.00)
LDL Cholesterol: 85 mg/dL (ref 0–99)
NonHDL: 114.8
TRIGLYCERIDES: 147 mg/dL (ref 0.0–149.0)
Total CHOL/HDL Ratio: 4
VLDL: 29.4 mg/dL (ref 0.0–40.0)

## 2013-09-17 ENCOUNTER — Ambulatory Visit: Payer: BLUE CROSS/BLUE SHIELD

## 2013-09-22 ENCOUNTER — Ambulatory Visit (HOSPITAL_BASED_OUTPATIENT_CLINIC_OR_DEPARTMENT_OTHER): Payer: Medicare Other

## 2013-09-22 ENCOUNTER — Ambulatory Visit (HOSPITAL_BASED_OUTPATIENT_CLINIC_OR_DEPARTMENT_OTHER): Payer: BLUE CROSS/BLUE SHIELD | Admitting: Physician Assistant

## 2013-09-22 ENCOUNTER — Other Ambulatory Visit (HOSPITAL_BASED_OUTPATIENT_CLINIC_OR_DEPARTMENT_OTHER): Payer: Medicare Other

## 2013-09-22 ENCOUNTER — Telehealth: Payer: Self-pay | Admitting: Physician Assistant

## 2013-09-22 ENCOUNTER — Telehealth: Payer: Self-pay | Admitting: *Deleted

## 2013-09-22 VITALS — BP 146/69 | HR 82 | Temp 98.2°F | Resp 18 | Ht 66.0 in | Wt 216.9 lb

## 2013-09-22 DIAGNOSIS — E041 Nontoxic single thyroid nodule: Secondary | ICD-10-CM

## 2013-09-22 DIAGNOSIS — C8190 Hodgkin lymphoma, unspecified, unspecified site: Secondary | ICD-10-CM

## 2013-09-22 DIAGNOSIS — Z5111 Encounter for antineoplastic chemotherapy: Secondary | ICD-10-CM

## 2013-09-22 DIAGNOSIS — D702 Other drug-induced agranulocytosis: Secondary | ICD-10-CM | POA: Diagnosis not present

## 2013-09-22 DIAGNOSIS — C184 Malignant neoplasm of transverse colon: Secondary | ICD-10-CM | POA: Diagnosis not present

## 2013-09-22 LAB — COMPREHENSIVE METABOLIC PANEL (CC13)
ALT: 21 U/L (ref 0–55)
ANION GAP: 8 meq/L (ref 3–11)
AST: 29 U/L (ref 5–34)
Albumin: 3.5 g/dL (ref 3.5–5.0)
Alkaline Phosphatase: 214 U/L — ABNORMAL HIGH (ref 40–150)
BILIRUBIN TOTAL: 0.26 mg/dL (ref 0.20–1.20)
BUN: 7.2 mg/dL (ref 7.0–26.0)
CO2: 30 meq/L — AB (ref 22–29)
Calcium: 9.4 mg/dL (ref 8.4–10.4)
Chloride: 105 mEq/L (ref 98–109)
Creatinine: 0.7 mg/dL (ref 0.6–1.1)
GLUCOSE: 119 mg/dL (ref 70–140)
Potassium: 4 mEq/L (ref 3.5–5.1)
SODIUM: 143 meq/L (ref 136–145)
TOTAL PROTEIN: 5.7 g/dL — AB (ref 6.4–8.3)

## 2013-09-22 LAB — CBC WITH DIFFERENTIAL/PLATELET
BASO%: 2 % (ref 0.0–2.0)
Basophils Absolute: 0.1 10*3/uL (ref 0.0–0.1)
EOS%: 10.9 % — ABNORMAL HIGH (ref 0.0–7.0)
Eosinophils Absolute: 0.5 10*3/uL (ref 0.0–0.5)
HCT: 33.6 % — ABNORMAL LOW (ref 34.8–46.6)
HGB: 10.9 g/dL — ABNORMAL LOW (ref 11.6–15.9)
LYMPH#: 0.9 10*3/uL (ref 0.9–3.3)
LYMPH%: 20.1 % (ref 14.0–49.7)
MCH: 30.5 pg (ref 25.1–34.0)
MCHC: 32.3 g/dL (ref 31.5–36.0)
MCV: 94.3 fL (ref 79.5–101.0)
MONO#: 1 10*3/uL — AB (ref 0.1–0.9)
MONO%: 23.8 % — ABNORMAL HIGH (ref 0.0–14.0)
NEUT#: 1.9 10*3/uL (ref 1.5–6.5)
NEUT%: 43.2 % (ref 38.4–76.8)
Platelets: 391 10*3/uL (ref 145–400)
RBC: 3.56 10*6/uL — ABNORMAL LOW (ref 3.70–5.45)
RDW: 19.8 % — ABNORMAL HIGH (ref 11.2–14.5)
WBC: 4.3 10*3/uL (ref 3.9–10.3)

## 2013-09-22 LAB — LACTATE DEHYDROGENASE (CC13): LDH: 180 U/L (ref 125–245)

## 2013-09-22 MED ORDER — VINBLASTINE SULFATE CHEMO INJECTION 1 MG/ML
6.0000 mg/m2 | Freq: Once | INTRAVENOUS | Status: AC
  Administered 2013-09-22: 13 mg via INTRAVENOUS
  Filled 2013-09-22: qty 13

## 2013-09-22 MED ORDER — SODIUM CHLORIDE 0.9 % IV SOLN
Freq: Once | INTRAVENOUS | Status: AC
  Administered 2013-09-22: 12:00:00 via INTRAVENOUS

## 2013-09-22 MED ORDER — HEPARIN SOD (PORK) LOCK FLUSH 100 UNIT/ML IV SOLN
500.0000 [IU] | Freq: Once | INTRAVENOUS | Status: AC | PRN
  Administered 2013-09-22: 500 [IU]
  Filled 2013-09-22: qty 5

## 2013-09-22 MED ORDER — SODIUM CHLORIDE 0.9 % IJ SOLN
10.0000 mL | INTRAMUSCULAR | Status: DC | PRN
  Administered 2013-09-22: 10 mL
  Filled 2013-09-22: qty 10

## 2013-09-22 MED ORDER — SODIUM CHLORIDE 0.9 % IV SOLN
800.0000 mg | Freq: Once | INTRAVENOUS | Status: AC
  Administered 2013-09-22: 800 mg via INTRAVENOUS
  Filled 2013-09-22: qty 40

## 2013-09-22 MED ORDER — DEXAMETHASONE SODIUM PHOSPHATE 20 MG/5ML IJ SOLN
20.0000 mg | Freq: Once | INTRAMUSCULAR | Status: AC
  Administered 2013-09-22: 20 mg via INTRAVENOUS

## 2013-09-22 MED ORDER — ONDANSETRON 16 MG/50ML IVPB (CHCC)
INTRAVENOUS | Status: AC
  Filled 2013-09-22: qty 16

## 2013-09-22 MED ORDER — DEXAMETHASONE SODIUM PHOSPHATE 20 MG/5ML IJ SOLN
INTRAMUSCULAR | Status: AC
  Filled 2013-09-22: qty 5

## 2013-09-22 MED ORDER — ONDANSETRON 16 MG/50ML IVPB (CHCC)
16.0000 mg | Freq: Once | INTRAVENOUS | Status: AC
  Administered 2013-09-22: 16 mg via INTRAVENOUS

## 2013-09-22 MED ORDER — DOXORUBICIN HCL CHEMO IV INJECTION 2 MG/ML
25.0000 mg/m2 | Freq: Once | INTRAVENOUS | Status: AC
  Administered 2013-09-22: 54 mg via INTRAVENOUS
  Filled 2013-09-22: qty 27

## 2013-09-22 NOTE — Patient Instructions (Signed)
[facility] Discharge Instructions for Patients Receiving Chemotherapy  Today you received the following chemotherapy agents Adriamycin, Velban and Dacarbazine.  To help prevent nausea and vomiting after your treatment, we encourage you to take your nausea medication as prescribed.   If you develop nausea and vomiting that is not controlled by your nausea medication, call the clinic.   BELOW ARE SYMPTOMS THAT SHOULD BE REPORTED IMMEDIATELY:  *FEVER GREATER THAN 100.5 F  *CHILLS WITH OR WITHOUT FEVER  NAUSEA AND VOMITING THAT IS NOT CONTROLLED WITH YOUR NAUSEA MEDICATION  *UNUSUAL SHORTNESS OF BREATH  *UNUSUAL BRUISING OR BLEEDING  TENDERNESS IN MOUTH AND THROAT WITH OR WITHOUT PRESENCE OF ULCERS  *URINARY PROBLEMS  *BOWEL PROBLEMS  UNUSUAL RASH Items with * indicate a potential emergency and should be followed up as soon as possible.  Feel free to call the clinic you have any questions or concerns. The clinic phone number is [phone].

## 2013-09-22 NOTE — Telephone Encounter (Signed)
Per staff message and POF I have scheduled appts. Advised scheduler of appts. JMW  

## 2013-09-22 NOTE — Telephone Encounter (Signed)
Pt confirmed labs/ov per 08/13 POF, gave pt AVS....KJ

## 2013-09-22 NOTE — Progress Notes (Signed)
[place], MD [address] [place] [address]  DIAGNOSIS: Hodgkin lymphoma - Plan: CBC with Differential, Comprehensive metabolic panel, Lactate dehydrogenase  No chief complaint on file.  CURRENT THERAPY:  Started AVD (without Bleomycin due to moderate restriction per PFTs) on 06/08/2013.  Cycle 1A was dose-reduced due to elevated liver function.  We will repeat PFTs following 2 cycles of AVD to consider addition of Bleomycin.   INTERVAL HISTORY: Diamond Boyd 73 y.o. 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   She was last seen by Dr. Juliann Mule on 09/01/2013.  She is accompanied by her husband Ray.   She tolerated her first cycle of AVD without Bleo on 06/08/2013 and had delayed count recovery requiring her to not receive AVD on 05/13.  She received neupogen on 05/13 and received cycle #1B on 05/20.  She received cycle 3A two weeks ago.  She has a rash underneath her breasts.  She reports that her bone pain improved with oxycodone 5 mg as needed.  She denies fevers or night sweats while on chemotherapy.     She denies specifically any pain, difficulty eating, trouble with her bowels, blood in the stool.     MEDICAL HISTORY: Past Medical History  Diagnosis Date  . Colon polyps   . Depression   . Hypothyroidism   . Osteoarthritis     hands  . Peripheral vascular disease   . Degenerative disk disease     spine in center in past   . 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 Cancer     skin cancer- basal cell on arm / colon 2011  . Lymphoma   . Colon cancer     08-2009    INTERIM HISTORY: has MALIGNANT NEOPLASM OF TRANSVERSE COLON; LYMPHOMA NEC, MLIG,  SPLEEN; HYPERCHOLESTEROLEMIA, PURE; DISORDERS OF PHOSPHORUS METABOLISM; Anxiety state, unspecified; DEPRESSION; PERIPHERAL VASCULAR DISEASE; Other chronic sinusitis; ALLERGIC  RHINITIS; GERD; OVERACTIVE BLADDER; MENOPAUSAL SYNDROME; OSTEOARTHRITIS; LEG EDEMA; THROAT PAIN, CHRONIC; SKIN CANCER, HX OF; COLONIC POLYPS, HX OF; Elevated blood pressure; Obesity; Hypothyroid; Varicose veins; Other asplenic status; History of anemia; Other screening mammogram; Routine gynecological examination; Colon cancer screening; Skin lesion of face; Thoracic back pain; Left knee pain; Encounter for Medicare annual wellness exam; Cystocele; Herpes zoster; Vaginal pain; Other malaise and fatigue; Acute sinusitis; Left temporal headache; Hyperglycemia; Conjunctivitis, acute; Cold sore; Hodgkin lymphoma; Dysuria; Colon cancer; Fever; and Drug induced neutropenia(288.03) on her problem list.    ALLERGIES:  is allergic to achromycin; allopurinol; astelin; cephalexin; codeine; meloxicam; minocycline; nabumetone; nyquil; penicillins; sulfa antibiotics; zolpidem tartrate; buspar; and ciprofloxacin.  MEDICATIONS: has a current medication list which includes the following prescription(s): acyclovir, calcium carbonate-vitamin d, celecoxib, cholecalciferol, cvs allergy relief, estradiol, fexofenadine, fluoxetine, fluticasone, furosemide, levothyroxine, lidocaine-prilocaine, omega-3, omeprazole, ondansetron, oxycodone, psyllium, simvastatin, tobramycin-dexamethasone, vitamin e, epipen 2-pak, lorazepam, nystatin, and prochlorperazine, and the following Facility-Administered Medications: sodium chloride and sodium chloride.  SURGICAL HISTORY:  Past Surgical History  Procedure Laterality Date  . Cholecystectomy    . Splenectomy      lymphoma  . Breast biopsy  1996  . Plantar fascia release    . Ventral hernia repair  1998  . Tendon release  Right thumb   . Colectomy  8/11  . Vaginal hysterectomy    . Bone marrow biopsy  05/27/13    PROBLEM LIST:  1. Adenocarcinoma of the colon with 2 synchronous primaries dating back to August 2011 when the patient was found to have anemia. Both tumors were associated with negative lymph nodes. One tumor was T2 N0. The other tumor was T3 N0. The T2 N0 stage I tumor was in the sigmoid colon. The T3 N0 stage II tumor was in the transverse colon. The smaller tumor measured 1.5 cm. The larger tumor measured 6.5 cm. Surgery was carried out on 09/28/2009. 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  2. History of marginal zone lymphoma probably dating back to 19. The patient had bone marrow involvement and splenomegaly at that time, also a hemolytic anemia. She underwent a splenectomy on  November 12, 1994. Through the years, the patient has received Rituxan most recently, dating back to May 2006. The patient received fludarabine in late 2002 and early 2003. She may have developed a pneumonitis from the fludarabine in December 2002. The patient has had involvement of the peripheral blood with villous lymphocytes. She appears to be in a state of complete clinical remission at this time. Tumor is CD5 and CD20 positive, also CD11c and FMC7 positive.  3. 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  4. History of lupus anticoagulant dating back to the 1990s.  5. History of iron deficiency anemia for which the patient received IV Feraheme in January 2012. Presumably, this occurred as a result of her colon cancer.  6. History of GERD resulting in cough diagnosed around 2010.  7. Dyslipidemia.  8. Hypothyroidism.  9. Irritable bowel syndrome.  10. Goiter seen on PET scan from 09/04/2009.  11. Enlarged left tonsil diagnosed April 2011, currently resolved.  12. History of depression.   REVIEW OF SYSTEMS:   Constitutional: Denies fevers, chills or abnormal weight loss Eyes: Denies blurriness of vision Ears, nose, mouth, throat, and face: Denies  mucositis or sore throat Respiratory: Denies cough, dyspnea or wheezes Cardiovascular: Denies palpitation, chest discomfort or lower extremity swelling Gastrointestinal:  Denies nausea, heartburn or change in bowel habits Skin: Denies abnormal skin rashes Lymphatics: Denies new lymphadenopathy or easy bruising Neurological:Denies numbness, tingling or new weaknesses Behavioral/Psych: Mood is stable, no new changes  All other systems were reviewed with the patient and are negative.  PHYSICAL EXAMINATION: ECOG PERFORMANCE STATUS: 1 - Symptomatic but completely ambulatory  Blood pressure 146/69, pulse 82, temperature 98.2 F (36.8 C), temperature source Oral, resp. rate 18, height 5' 6"  (1.676 m), weight 216 lb 14.4 oz (98.385 kg), last menstrual period 02/10/1970.  GENERAL:alert, mildly distressed but comfortable; moderately obese. Non-toxic appearing; alopecia SKIN: skin color, texture, turgor are normal, no  significant lesions; small reddish bumps on anterior frontal scalp.   EYES: normal, Conjunctiva are pink, sclera clear OROPHARYNX:no exudate, no erythema and lips, buccal mucosa, and tongue normal  NECK: supple, thyroid normal size, non-tender, without nodularity.  Left neck adenopathy resolved LYMPH:  no palpable lymphadenopathy in the cervical, axillary or supraclavicular LUNGS: clear to auscultation and percussion with normal breathing effort HEART: regular rate & rhythm and no murmurs and no lower extremity edema ABDOMEN:abdomen soft, non-tender and normal bowel sounds; multiple surgical scars well healed.   Musculoskeletal:no cyanosis of digits and no clubbing  NEURO: alert & oriented x 3 with fluent speech, no focal motor/sensory deficits  Skin: rash beneath breasts- resolved  Labs:  Lab Results  Component Value Date   WBC 4.3 09/22/2013   HGB 10.9* 09/22/2013   HCT 33.6* 09/22/2013   MCV 94.3 09/22/2013   PLT 391 09/22/2013   NEUTROABS 1.9 09/22/2013      Chemistry       Component Value Date/Time   NA 143 09/22/2013 1021   NA 131* 06/07/2013 2300   K 4.0 09/22/2013 1021   K 4.1 06/07/2013 2300   CL 92* 06/07/2013 2300   CL 105 07/14/2012 0939   CO2 30* 09/22/2013 1021   CO2 21 06/07/2013 2300   BUN 7.2 09/22/2013 1021   BUN 17 06/07/2013 2300   CREATININE 0.7 09/22/2013 1021   CREATININE 0.74 06/07/2013 2300      Component Value Date/Time   CALCIUM 9.4 09/22/2013 1021   CALCIUM 9.3 06/07/2013 2300   CALCIUM 9.3 08/09/2009 0000   ALKPHOS 214* 09/22/2013 1021   ALKPHOS 529* 06/07/2013 2358   AST 29 09/22/2013 1021   AST 121* 06/07/2013 2358   ALT 21 09/22/2013 1021   ALT 46* 06/07/2013 2358   BILITOT 0.26 09/22/2013 1021   BILITOT 2.5* 06/07/2013 2358     IMAGING STUDIES:  1. Digital screening mammogram from 08/05/2010 was negative.  2. Chest x-ray, 2 view, from 12/09/2010 was negative.  3. Digital screening mammogram on 08/20/2011 showed no specific evidence of malignancy  4. Chest x-ray, 2 view, from 11/19/2011 showed atherosclerotic calcifications within the arch of the aorta. There was evidence of a prior cholecystectomy and splenectomy, otherwise chest x-ray was negative.  5. CT scan of abdomen and pelvis with IV contrast obtained on 01/21/2012 showed no clear evidence of colon cancer metastasis. A retrocrural lymph node had increased in size but periportal adenopathy and periaortic adenopathy was stable to decreased. The retrocrural lymph node refer to measures 8 mm on image 12, increased from 5 mm on a prior study.  6. Chest x-ray, 2 view, from 06/01/2012 showed no acute abnormalities. There was borderline enlargement of the cardiac silhouette.  7. Chest x-ray, 2 view, from 03/09/2013, showed new pulmonary nodules in the right lung. Cannot rule out  metastatic disease. Correlation with CT recommended. (reviewed by me) 8. CT scan of abdomen and pelvis with IV contrast on 03/09/2013 showed small right lower lobe pulmonary nodules measuring up to 8 mm, new, suspicious  for metastases. Small right juxtadiaphragmatic, upper abdominal, retroperitoneal, and left pelvic lymph nodes, stable versus mildly increased. Prior splenectomy, cholecystectomy, and right hemicolectomy. PET-CT is suggested for further evaluation.  (reviewed by me) 9. PET/CT on 03/21/2013 revealed intensely hypermetabolic nodule within the left lobe of thyroid gland. Concern for thyroid carcinoma. 2. Two intensely hypermetabolic right lower lobe pulmonary nodules. Differential includes thyroid cancer metastasis or colon cancer metastasis. 3. Intense uptake within the right sacrum is concerning metastasis. Milder abnormal uptake within the T9 vertebral body is indeterminate but concerning for metastasis. 10. PET on 08/17/2013 showed an interval decrease in size and hypermetabolism of the right lower lobe pulmonary nodule seen on the previous study, consistent with interval response to therapy.  PROCEDURES:  Colonoscopy was carried out by Dr. Erskine Emery on 10/10/2010.  IMAGING: None  PATHOLOGY:  Lung, needle/core biopsy(ies), RLL sup seg nodule pet+ - MALIGNANT LYMPHORETICULAR NEOPLASM, MOST CONSISTENT WITH CLASSICAL HODGKIN LYMPHOMA. - SEE COMMENT. Diagnosis Note The core biopsies reveal a diffuse lymphohistiocytic infiltrate. There are scattered Hodgkin Reed-Sternberg like cells with prominent nuclei. There are rare lacunar like cells.  There are no significant eosinophils or plasma cells seen in the background. Immunohistochemistry reveals scattered CD30 positive cells with golgi staining. The cells are also positive for CD15 and PAX-5. CD45 interpretation is hampered by diffuse background T-cells. CD3 and CD43 highlight abundant small T-cells. Only scattered B-cells are seen with CD79a and CD20. EBV in situ (EBER) is negative. CDX-2, cytokeratin 7 and cytokeratin 20 are negative for carcinoma. Overall, these findings are consistent with extranodal pulmonary involvement by classical Hodgkin lymphoma. The  case was sent to [facility] for consultation (report [phone]), who agrees with the above interpretation. Dr. Juliann Mule was paged on 05/19/2013.  05/26/2013  Bone Marrow, Aspirate,Biopsy, and Clot, right iliac - HYPERCELLULAR BONE MARROW WITH ATYPICAL LYMPHOHISTIOCYTIC PROLIFERATION. - TRILINEAGE HEMATOPOIESIS.- SEE COMMENT.PERIPHERAL BLOOD:- NORMOCYTIC-NORMOCHROMIC ANEMIA. - MONOCYTOSIS.Diagnosis Note The aspirate material is extremely limited for morphologic evaluation. However, the core biopsy shows numerous variably sized and ill-defined interstitial and paratrabecular atypical lymphohistiocytic aggregates mostly composed of small lymphocytes and histiocytes in addition to scattering of larger CD30 positive lymphoid appearing cells. The histologic and phenotypic features are similar to previously diagnosed classical Hodgkin's lymphoma ([id number]) and consistent with marrow involvement by the same disease process. (BNS:ecj/gt 05/27/2013)BASSAM SMIR MD Pathologist, Electronic Signature (Case signed 05/31/2013)  ASSESSMENT: Diamond Boyd 73 y.o. female with a history of Hodgkin lymphoma - Plan: CBC with Differential, Comprehensive metabolic panel, Lactate dehydrogenase now with Hodgkins lymphoma with B symptoms including (Fevers, night sweats and weight lost).   PLAN:  1. Chemotherapy-induced neutropenia. --Based on an Snyder of 100, we will hold her chemotherapy today and reschedule for 09/08/2013 and give her granix 480 mcg x 3 days.   We switched her neulasta to granix on prior regiments to avoid the severe bone pains and arthralgias; however, the neulasta promoted greater count recovery.  We will therefore switch back to neulasta.   2. H.o of Marginal zone lymphoma now with multiple lung nodules consistent with Hodgkin's lymphoma (Stage IV with bone marrow involvement) with B symptoms.   --Previously, we had an extensive discussion and review of her pulmonary pathology and final  report conclusive for classical hodgkin's disease with bone marrow invovlement.  She reported fevers night sweats which would be considered a constitutional B symptoms. Her ESR is 84 (high).  Based on advanced lymphoma, she received a bone marrow biopsy which demonstrated involvement of Hodgkin's lymphoma.  Her echo also was obtained demonstrating a normal EF.  She received a R port-a-cath.   Her PFTs  demonstrated a moderate restriction (likely secondary to presence of the disease).   --We started chemotherapy with AVD x 6 cycles (category 2A) q28 days (doxorubicin, vinblastine, and dacarbazine) (Duggan DB, J[employer], 2003).  Based on moderate restriction component of her PFTs, we will held bleomycin.     Day 1, Cycle 3B 09/08/2013  Doxorubicin (Adriamycin) 25 mg/m2  Vinblastine 6 mg/m2  Dacarbazine 375 mg/m2   -She will have Day1, Cycle 4A on 09/22/2013 as scheduled.  We will give neulasta on Day #2 of each cycle due to delayed count recovery. We obtained a restaging PET-CT following Cycle #2 (NCCN Guidelines, Version 2.2014, HODG-9) to assess response and further treatment based on [place] criteria for response. It demonstrated an interval decrease in size and hypermetabolism of the right lower lobe pulmonary nodule seen on the previous study, consistent with interval response to therapy.   A PET scan on 08/17/2013 revealed further decrease in size and hypermetabolism of the right lower lobe pulmonary nodule consistent  with interval response to therapy. These results were discussed with Jasimine.  --Her LDH and ESR has normalized.  Her creatinine is normal. Her calcium and serum potassium is normal. She was given anti-emetics and in addition we continue acyclovir 500 mg bid prophylaxis.   She has a listed allergy to allopurinol.   3. Colon cancer.   --Charlene remains clinically stable. Her last colonoscopy was on  10/10/2010 by Dr. Deatra Ina.  We will make Dr. Deatra Ina of aware. CEA is stable at 1.9.     4.  Thyroid nodule.    --Her PET/CT was also concerning for thyroid carcinoma. TSH on 01/04/2013 was 0.51.  Biopsy was negative for malignancy.   5. Right eye irritation, resolved -- She is being followed by dermatology and opthamology.  Resolved  6. Skin irritation beneath fold of breast and pannus. --Resolved  7. Follow-up.  --We will plan to see Huma again in 4 weeks for a brief symptom visit and for consideration of AVD Cycle 5A.   All questions were answered. The patient knows to call the clinic with any problems, questions or concerns. We can certainly see the patient much sooner if necessary.  Patient reviewed with Dr. Juliann Mule.   Carlton Adam, PA-C 09/22/2013 4:54 PM

## 2013-09-23 ENCOUNTER — Ambulatory Visit (INDEPENDENT_AMBULATORY_CARE_PROVIDER_SITE_OTHER): Payer: Medicare Other | Admitting: Family Medicine

## 2013-09-23 ENCOUNTER — Ambulatory Visit (HOSPITAL_BASED_OUTPATIENT_CLINIC_OR_DEPARTMENT_OTHER): Payer: Medicare Other

## 2013-09-23 ENCOUNTER — Encounter: Payer: Self-pay | Admitting: Family Medicine

## 2013-09-23 VITALS — BP 116/60 | HR 85 | Temp 98.4°F | Ht 65.0 in | Wt 217.0 lb

## 2013-09-23 VITALS — BP 134/62 | HR 77 | Temp 98.0°F

## 2013-09-23 DIAGNOSIS — E7800 Pure hypercholesterolemia, unspecified: Secondary | ICD-10-CM | POA: Diagnosis not present

## 2013-09-23 DIAGNOSIS — N952 Postmenopausal atrophic vaginitis: Secondary | ICD-10-CM

## 2013-09-23 DIAGNOSIS — R739 Hyperglycemia, unspecified: Secondary | ICD-10-CM

## 2013-09-23 DIAGNOSIS — E038 Other specified hypothyroidism: Secondary | ICD-10-CM | POA: Diagnosis not present

## 2013-09-23 DIAGNOSIS — B372 Candidiasis of skin and nail: Secondary | ICD-10-CM

## 2013-09-23 DIAGNOSIS — J328 Other chronic sinusitis: Secondary | ICD-10-CM

## 2013-09-23 DIAGNOSIS — D702 Other drug-induced agranulocytosis: Secondary | ICD-10-CM | POA: Diagnosis not present

## 2013-09-23 DIAGNOSIS — R7309 Other abnormal glucose: Secondary | ICD-10-CM | POA: Diagnosis not present

## 2013-09-23 DIAGNOSIS — Z0000 Encounter for general adult medical examination without abnormal findings: Secondary | ICD-10-CM | POA: Diagnosis not present

## 2013-09-23 DIAGNOSIS — E669 Obesity, unspecified: Secondary | ICD-10-CM | POA: Diagnosis not present

## 2013-09-23 DIAGNOSIS — C8190 Hodgkin lymphoma, unspecified, unspecified site: Secondary | ICD-10-CM

## 2013-09-23 MED ORDER — FLUOXETINE HCL 20 MG PO CAPS: 40.0000 mg | ORAL_CAPSULE | Freq: Every morning | ORAL | Status: DC

## 2013-09-23 MED ORDER — CELECOXIB 200 MG PO CAPS: 200.0000 mg | ORAL_CAPSULE | Freq: Every day | ORAL | Status: DC | PRN

## 2013-09-23 MED ORDER — FEXOFENADINE HCL 180 MG PO TABS: 180.0000 mg | ORAL_TABLET | Freq: Every day | ORAL | Status: DC

## 2013-09-23 MED ORDER — LEVOTHYROXINE SODIUM 112 MCG PO TABS: ORAL_TABLET | ORAL | Status: DC

## 2013-09-23 MED ORDER — FLUTICASONE PROPIONATE 50 MCG/ACT NA SUSP: 1.0000 | Freq: Two times a day (BID) | NASAL | Status: DC

## 2013-09-23 MED ORDER — PEGFILGRASTIM INJECTION 6 MG/0.6ML
6.0000 mg | Freq: Once | SUBCUTANEOUS | Status: AC
  Administered 2013-09-23: 6 mg via SUBCUTANEOUS
  Filled 2013-09-23: qty 0.6

## 2013-09-23 MED ORDER — NYSTATIN 100000 UNIT/GM EX POWD: CUTANEOUS | Status: DC

## 2013-09-23 MED ORDER — ESTRADIOL 2 MG PO TABS: 1.0000 mg | ORAL_TABLET | Freq: Every day | ORAL | Status: DC

## 2013-09-23 NOTE — Progress Notes (Signed)
Pre visit review using our clinic review tool, if applicable. No additional management support is needed unless otherwise documented below in the visit note. 

## 2013-09-23 NOTE — Assessment & Plan Note (Signed)
Worse with chemo as expected  Will continue to monitor Cannot follow DM diet currently-disc imp of sugar avoidance when able

## 2013-09-23 NOTE — Assessment & Plan Note (Signed)
Overall improved  zocor and diet Disc goals for lipids and reasons to control them Rev labs with pt Rev low sat fat diet in detail

## 2013-09-23 NOTE — Assessment & Plan Note (Signed)
Enc healthy diet and activity as tol  During chemo-this is limited dep on how she feels

## 2013-09-23 NOTE — Assessment & Plan Note (Signed)
Exam unremarkable  Is on HRT-disc pros/cons/risks  Also recommend vaginal lubricants prn

## 2013-09-23 NOTE — Progress Notes (Signed)
Subjective:    Patient ID: [name], female    DOB: 1940-09-14, 73 y.o.   MRN: [id number]  HPI I have personally reviewed the Medicare Annual Wellness questionnaire and have noted 1. The patient's medical and social history 2. Their use of alcohol, tobacco or illicit drugs 3. Their current medications and supplements 4. The patient's functional ability including ADL's, fall risks, home safety risks and hearing or visual             impairment. 5. Diet and physical activities 6. Evidence for depression or mood disorders  The patients weight, height, BMI have been recorded in the chart and visual acuity is per eye clinic.  I have made referrals, counseling and provided education to the patient based review of the above and I have provided the pt with a written personalized care plan for preventive services.  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  Routine anticipatory guidance given to patient.  See health maintenance. Colon cancer screening 8/12 - has her letter for 3 year follow up  Breast cancer screening- mammogram 7/14 (has rash under her breasts) - yeast (has a bottle of powder she needs refill of) - her port may prohibit mammo- she will check with oncologist  Self breast exam - no lumps  Flu vaccine 10/14- will get in the fall  Tetanus vaccine Td 12/07 Pneumovax 8/11 also had meningiococcal vaccine 6/09 due to asplenia  Zoster vaccine3/13   Advance directive- has ? Part of it set up ? Unsure  Cognitive function addressed- see scanned forms- and if abnormal then additional  documentation follows. - she struggles with name searching occ -but that is not unusual / esp with her cancer treatment   Also needs eye exam   Is having some vaginal discomfort after intercourse lately  Blood sugar control is worse than it was    PMH and SH reviewed  Meds, vitals, and allergies reviewed.   ROS: See HPI.  Otherwise negative.    Hx of hyperglycemia Lab Results  Component Value Date   HGBA1C 7.3* 09/16/2013   this is up from 6.2 She can eat small amt -and cannot tolerate much/ food does not taste right / cannot follow a DM diet well right now   Anemia watched by oncology Lab Results  Component Value Date   WBC 4.3 09/22/2013   HGB 10.9* 09/22/2013   HCT 33.6* 09/22/2013   MCV 94.3 09/22/2013   PLT 391 09/22/2013     Hyperlipidemia zocor and diet  Lab Results  Component Value Date   CHOL 160 09/16/2013   CHOL 208* 09/17/2012   CHOL 204* 07/18/2011   Lab Results  Component Value Date   HDL 45.20 09/16/2013   HDL 49.40 09/17/2012   HDL 60.70 07/18/2011   Lab Results  Component Value Date   LDLCALC 85 09/16/2013   LDLCALC 105* 07/18/2009   LDLCALC 100* 07/05/2008   Lab Results  Component Value Date   TRIG  147.0 09/16/2013   TRIG 174.0* 09/17/2012   TRIG 140.0 07/18/2011   Lab Results  Component Value Date   CHOLHDL 4 09/16/2013   CHOLHDL 4 09/17/2012   CHOLHDL 3 07/18/2011   Lab Results  Component Value Date   LDLDIRECT 133.0 09/17/2012   LDLDIRECT 134.0 07/18/2011   LDLDIRECT 126.0 02/19/2011   at goal for her cholesterol right now  Thrilled with that   Hypothyroidism  Pt has no clinical changes Lost hair from chemo and wt is stable  Lab Results  Component Value Date   TSH 2.53 09/16/2013     Patient Active Problem List   Diagnosis Date Noted  . Candidal intertrigo 09/23/2013  . Atrophic vaginitis 09/23/2013  . Drug induced neutropenia(288.03) 09/01/2013  . Fever 07/26/2013  . Colon cancer 06/17/2013  . Hodgkin lymphoma 05/20/2013  . Dysuria 05/20/2013  .  Conjunctivitis, acute 04/27/2013  . Cold sore 04/27/2013  . Left temporal headache 03/11/2013  . Hyperglycemia 03/11/2013  . Acute sinusitis 02/09/2013  . Other malaise and fatigue 01/04/2013  . Herpes zoster 11/22/2012  . Vaginal pain 11/22/2012  . Cystocele 09/22/2012  . Encounter for Medicare annual wellness exam 09/17/2012  . Left knee pain 09/10/2012  . Thoracic back pain 06/01/2012  . Skin lesion of face 05/17/2012  . Other screening mammogram 07/22/2011  . Routine gynecological examination 07/22/2011  . Colon cancer screening 07/22/2011  . History of anemia 05/06/2011  . 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 Hypothyroid 02/24/2011  . Varicose veins 02/24/2011  . Elevated blood pressure 11/20/2010  . Obesity 11/20/2010  . 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 Other chronic sinusitis 07/03/2009  . THROAT PAIN, CHRONIC 07/03/2009  . GERD 12/20/2008  . Anxiety state, unspecified 08/22/2008  . MENOPAUSAL SYNDROME 07/13/2007  . HYPERCHOLESTEROLEMIA, PURE 07/08/2006  . ALLERGIC  RHINITIS 07/08/2006  . OVERACTIVE BLADDER 07/08/2006  . 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 DEPRESSION 06/25/2006  . PERIPHERAL VASCULAR DISEASE 06/25/2006  . OSTEOARTHRITIS 06/25/2006  . LEG EDEMA 06/25/2006  . SKIN CANCER, HX OF 06/25/2006  . COLONIC POLYPS, HX OF 06/25/2006   Past Medical History  Diagnosis Date  . Colon polyps   . Depression   . Hypothyroidism   . Osteoarthritis     hands  . Peripheral vascular disease   . Degenerative disk disease     spine in center in past   . 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 Cancer     skin cancer- basal cell on arm / colon 2011  . Lymphoma   . Colon cancer     08-2009   Past Surgical History  Procedure Laterality Date  . Cholecystectomy    . Splenectomy      lymphoma  . Breast biopsy  1996  . Plantar fascia release    . Ventral hernia repair  1998  . Tendon release        Right thumb   . Colectomy  8/11  . Vaginal hysterectomy    . Bone marrow biopsy  05/27/13   History  Substance Use Topics  . Smoking status: Never Smoker   . Smokeless tobacco: Never Used  . Alcohol Use: No   Family History  Problem Relation Age of Onset  . Coronary artery disease Mother   . Alcohol abuse Mother   . Cancer Father     brain  . Diabetes Brother   . Fibromyalgia Daughter     chronic  pain   . COPD Daughter   . Colon cancer Neg Hx   . Colon polyps Neg Hx   . Stomach cancer Neg Hx    Allergies  Allergen Reactions  . Achromycin [Tetracycline Hcl]     Pt does not remember reaction  . Allopurinol     REACTION: Unsure of reaction happene years ago  . Astelin [Azelastine Hcl]     Unknown  . Cephalexin     REACTION: unsure of reaction happened yrs ago.  . Codeine Other (See Comments)    REACTION: abd. pain  . Meloxicam Other (See Comments)    REACTION: GI symptoms  . Minocycline Other (See Comments)    Abdominal pain  . Nabumetone     REACTION: reaction not known  . Nyquil [Pseudoeph-Doxylamine-Dm-Apap] Hives  . Penicillins     REACTION: reaction not known  . Sulfa Antibiotics     Gi side eff   . Zolpidem Tartrate     REACTION: feels too drugged  . Buspar [Buspirone Hcl]     Dizziness, and not as effective for anxiety  . Ciprofloxacin Rash   Current Outpatient Prescriptions on File Prior to Visit  Medication Sig Dispense Refill  . acyclovir (ZOVIRAX) 400 MG tablet Take 1 tablet (400 mg total) by mouth 2 (two) times daily.  60 tablet  1  . Calcium Carbonate-Vitamin D (CALCIUM 600+D) 600-400 MG-UNIT per tablet Take 1 tablet by mouth daily.       . cholecalciferol (VITAMIN D) 1000 UNITS tablet Take 1,000 Units by mouth daily.       . [employer] ALLERGY RELIEF 180 MG tablet TAKE 1 TABLET BY MOUTH DAILY.  90 tablet  1  . EPIPEN 2-PAK 0.3 MG/0.3ML SOAJ injection as needed.      . furosemide (LASIX) 20 MG tablet Take 40 mg by mouth daily.      Marland Kitchen  lidocaine-prilocaine (EMLA) cream Apply 1 application topically as needed.  30 g  0  . LORazepam (ATIVAN) 0.5 MG tablet Take 1 tablet (0.5 mg total) by mouth every 6 (six) hours as needed (Nausea or vomiting).  30 tablet  0  . nystatin (MYCOSTATIN) powder Apply topically 4 (four) times daily.  30 g  0  . Omega-3 350 MG CAPS Take 1 capsule by mouth daily.      Marland Kitchen omeprazole (PRILOSEC) 40 MG capsule Take 40 mg by mouth 2 (two) times daily.       . ondansetron (ZOFRAN) 8 MG tablet Take 1 tablet (8 mg total) by mouth 2 (two) times daily. Start the day after chemo for 3 days. Then take as needed for nausea or vomiting.  30 tablet  1  . oxyCODONE (OXY IR/ROXICODONE) 5 MG immediate release tablet Take 1 tablet (5 mg total) by mouth every 6 (six) hours as needed for severe pain.  60 tablet  0  . prochlorperazine (COMPAZINE) 10 MG tablet Take 1 tablet (10 mg total) by mouth every 6 (six) hours as needed (Nausea or vomiting).  30 tablet  1  . psyllium (METAMUCIL) 58.6 % packet Take 1-3 packets by mouth daily.       . simvastatin (ZOCOR) 40 MG tablet Take 40 mg by mouth every evening.      . tobramycin-dexamethasone (TOBRADEX) ophthalmic ointment Place 1 application into the right eye 3 (three) times daily as needed.       . vitamin E (VITAMIN E) 400 UNIT capsule Take 400 Units by mouth daily.  Current Facility-Administered Medications on File Prior to Visit  Medication Dose Route Frequency Provider Last Rate Last Dose  . 0.9 %  sodium chloride infusion  500 mL Intravenous Continuous Inda Castle, MD         Review of Systems Review of Systems  Constitutional: Negative for fever, appetite change,  and unexpected weight change.  Eyes: Negative for pain and visual disturbance.  Respiratory: Negative for cough and shortness of breath.   Cardiovascular: Negative for cp or palpitations    Gastrointestinal: Negative for nausea, diarrhea and constipation.  Genitourinary: Negative for urgency and  frequency. pos for vaginal discomfort with intercourse  Skin: Negative for pallor and pos for rash  under breasts  Neurological: Negative for weakness, light-headedness, numbness and headaches.  Hematological: Negative for adenopathy. Does bruise/bleed easily.  Psychiatric/Behavioral: Negative for dysphoric mood. The patient is  nervous/anxious.         Objective:   Physical Exam  Constitutional: She appears well-developed and well-nourished. No distress.  HENT:  Head: Normocephalic and atraumatic.  Right Ear: External ear normal.  Left Ear: External ear normal.  Mouth/Throat: Oropharynx is clear and moist.  Eyes: Conjunctivae and EOM are normal. Pupils are equal, round, and reactive to light. No scleral icterus.  Neck: Normal range of motion. Neck supple. No JVD present. Carotid bruit is not present. No thyromegaly present.  Cardiovascular: Normal rate, regular rhythm, normal heart sounds and intact distal pulses.  Exam reveals no gallop.   Pulmonary/Chest: Effort normal and breath sounds normal. No respiratory distress. She has no wheezes. She exhibits no tenderness.  Abdominal: Soft. Bowel sounds are normal. She exhibits no distension, no abdominal bruit and no mass. There is no tenderness.  Genitourinary: No breast swelling, tenderness, discharge or bleeding. There is no rash, tenderness or lesion on the right labia. There is no rash, tenderness or lesion on the left labia. Uterus is not enlarged and not tender. Cervix exhibits no motion tenderness, no discharge and no friability. Right adnexum displays no mass, no tenderness and no fullness. Left adnexum displays no mass, no tenderness and no fullness. No erythema or bleeding around the vagina.  Breast exam: No mass, nodules, thickening, tenderness, bulging, retraction, inflamation, nipple discharge or skin changes noted.  No axillary or clavicular LA.      Vaginal exam- vaginal atrophy/ dryness noted No abnormal appearing areas  otherwise   Scarring noted from prev shingles rash  Musculoskeletal: Normal range of motion. She exhibits no edema and no tenderness.  Lymphadenopathy:    She has no cervical adenopathy.  Neurological: She is alert. She has normal reflexes. No cranial nerve deficit. She exhibits normal muscle tone. Coordination normal.  Skin: Skin is warm and dry. No rash noted. No erythema. No pallor.  Psychiatric: She has a normal mood and affect.          Assessment & Plan:   Problem List Items Addressed This Visit     Respiratory   Other chronic sinusitis     Re ordered CT sinuses from March that never was done Pt still c/o of R sinus pain and cong     Relevant Medications      fexofenadine (ALLEGRA) 180 MG tablet      fluticasone (FLONASE) 50 MCG/ACT nasal spray   Other Relevant Orders      CT MAXILLOFACIAL LTD WO CM     Endocrine   Hypothyroid      Lab Results  Component Value Date   TSH  2.53 09/16/2013   No change in dose of levothyroxine     Relevant Medications      levothyroxine (SYNTHROID, LEVOTHROID) tablet     Musculoskeletal and Integument   Candidal intertrigo     Worsened with chemo Refilled nystatin powder today Disc imp of keeping area dry- under breasts and in groin    Relevant Medications      Nystatin (MYCOSTATIN) 100,000 units/Gm top powder     Genitourinary   Atrophic vaginitis     Exam unremarkable  Is on HRT-disc pros/cons/risks  Also recommend vaginal lubricants prn      Other   HYPERCHOLESTEROLEMIA, PURE     Overall improved  zocor and diet Disc goals for lipids and reasons to control them Rev labs with pt Rev low sat fat diet in detail     Obesity     Enc healthy diet and activity as tol  During chemo-this is limited dep on how she feels     Encounter for Medicare annual wellness exam - Primary     Reviewed health habits including diet and exercise and skin cancer prevention Reviewed appropriate screening tests for age  Also reviewed  health mt list, fam hx and immunization status , as well as social and family history   See HPI She will call about colonosc 3 y recall - ? If will wait until after done with chemo Will call about mammo- ? If can do with her port  Will ask oncol when she can get prevnar (on chemo?)  Enc self care     Hyperglycemia     Worse with chemo as expected  Will continue to monitor Cannot follow DM diet currently-disc imp of sugar avoidance when able

## 2013-09-23 NOTE — Assessment & Plan Note (Signed)
Lab Results  Component Value Date   TSH 2.53 09/16/2013   No change in dose of levothyroxine

## 2013-09-23 NOTE — Assessment & Plan Note (Signed)
Re ordered CT sinuses from March that never was done Pt still c/o of R sinus pain and cong

## 2013-09-23 NOTE — Assessment & Plan Note (Signed)
Reviewed health habits including diet and exercise and skin cancer prevention Reviewed appropriate screening tests for age  Also reviewed health mt list, fam hx and immunization status , as well as social and family history   See HPI She will call about colonosc 3 y recall - ? If will wait until after done with chemo Will call about mammo- ? If can do with her port  Will ask oncol when she can get prevnar (on chemo?)  Enc self care

## 2013-09-23 NOTE — Patient Instructions (Signed)
Call the GI office about your colonoscopy- they may want to wait until you get through chemo  Ask your oncologist if it is ok for you to get a mammogram with your port (you can schedule that yourself) Keep using powder on rash  Ask your oncologist if you can get a Prevnar vaccine (pneumonia booster vaccine) -since you are on chemo Stop at checkout to schedule your CT of the sinuses   I will send in your px medications

## 2013-09-23 NOTE — Assessment & Plan Note (Signed)
Worsened with chemo Refilled nystatin powder today Disc imp of keeping area dry- under breasts and in groin

## 2013-09-29 ENCOUNTER — Ambulatory Visit: Payer: BLUE CROSS/BLUE SHIELD

## 2013-09-29 NOTE — Patient Instructions (Signed)
Continue lab & chemotherapy as scheduled Follow up in 4 weeks for another symptom management visit

## 2013-09-30 ENCOUNTER — Ambulatory Visit: Payer: BLUE CROSS/BLUE SHIELD

## 2013-10-01 ENCOUNTER — Ambulatory Visit: Payer: BLUE CROSS/BLUE SHIELD

## 2013-10-04 ENCOUNTER — Ambulatory Visit (INDEPENDENT_AMBULATORY_CARE_PROVIDER_SITE_OTHER)
Admission: RE | Admit: 2013-10-04 | Discharge: 2013-10-04 | Disposition: A | Discharge status: Home / Self Care | Payer: Medicare Other | Source: Ambulatory Visit | Attending: Family Medicine | Admitting: Family Medicine

## 2013-10-04 DIAGNOSIS — J328 Other chronic sinusitis: Secondary | ICD-10-CM

## 2013-10-04 DIAGNOSIS — J3489 Other specified disorders of nose and nasal sinuses: Secondary | ICD-10-CM | POA: Diagnosis not present

## 2013-10-05 ENCOUNTER — Encounter (HOSPITAL_COMMUNITY): Payer: Self-pay

## 2013-10-06 ENCOUNTER — Ambulatory Visit (HOSPITAL_BASED_OUTPATIENT_CLINIC_OR_DEPARTMENT_OTHER): Payer: Medicare Other

## 2013-10-06 ENCOUNTER — Ambulatory Visit (HOSPITAL_BASED_OUTPATIENT_CLINIC_OR_DEPARTMENT_OTHER): Payer: BLUE CROSS/BLUE SHIELD

## 2013-10-06 ENCOUNTER — Other Ambulatory Visit: Payer: Self-pay | Admitting: Internal Medicine

## 2013-10-06 VITALS — BP 129/95 | HR 84 | Temp 98.7°F | Resp 18

## 2013-10-06 DIAGNOSIS — C184 Malignant neoplasm of transverse colon: Secondary | ICD-10-CM

## 2013-10-06 DIAGNOSIS — C8190 Hodgkin lymphoma, unspecified, unspecified site: Secondary | ICD-10-CM | POA: Diagnosis not present

## 2013-10-06 DIAGNOSIS — Z5111 Encounter for antineoplastic chemotherapy: Secondary | ICD-10-CM

## 2013-10-06 LAB — CBC WITH DIFFERENTIAL/PLATELET
BASO%: 2.4 % — ABNORMAL HIGH (ref 0.0–2.0)
BASOS ABS: 0.1 10*3/uL (ref 0.0–0.1)
EOS%: 4.5 % (ref 0.0–7.0)
Eosinophils Absolute: 0.2 10*3/uL (ref 0.0–0.5)
HCT: 33.1 % — ABNORMAL LOW (ref 34.8–46.6)
HGB: 10.7 g/dL — ABNORMAL LOW (ref 11.6–15.9)
LYMPH#: 0.9 10*3/uL (ref 0.9–3.3)
LYMPH%: 23.9 % (ref 14.0–49.7)
MCH: 30.5 pg (ref 25.1–34.0)
MCHC: 32.3 g/dL (ref 31.5–36.0)
MCV: 94.3 fL (ref 79.5–101.0)
MONO#: 0.9 10*3/uL (ref 0.1–0.9)
MONO%: 22.9 % — ABNORMAL HIGH (ref 0.0–14.0)
NEUT#: 1.8 10*3/uL (ref 1.5–6.5)
NEUT%: 46.3 % (ref 38.4–76.8)
Platelets: 431 10*3/uL — ABNORMAL HIGH (ref 145–400)
RBC: 3.51 10*6/uL — ABNORMAL LOW (ref 3.70–5.45)
RDW: 19.7 % — AB (ref 11.2–14.5)
WBC: 3.8 10*3/uL — ABNORMAL LOW (ref 3.9–10.3)

## 2013-10-06 LAB — COMPREHENSIVE METABOLIC PANEL (CC13)
ALK PHOS: 244 U/L — AB (ref 40–150)
ALT: 28 U/L (ref 0–55)
AST: 37 U/L — AB (ref 5–34)
Albumin: 3.5 g/dL (ref 3.5–5.0)
Anion Gap: 8 mEq/L (ref 3–11)
BUN: 6.8 mg/dL — ABNORMAL LOW (ref 7.0–26.0)
CO2: 28 mEq/L (ref 22–29)
Calcium: 9.3 mg/dL (ref 8.4–10.4)
Chloride: 107 mEq/L (ref 98–109)
Creatinine: 0.7 mg/dL (ref 0.6–1.1)
GLUCOSE: 121 mg/dL (ref 70–140)
Potassium: 3.9 mEq/L (ref 3.5–5.1)
SODIUM: 142 meq/L (ref 136–145)
TOTAL PROTEIN: 5.9 g/dL — AB (ref 6.4–8.3)
Total Bilirubin: 0.36 mg/dL (ref 0.20–1.20)

## 2013-10-06 LAB — LACTATE DEHYDROGENASE (CC13): LDH: 243 U/L (ref 125–245)

## 2013-10-06 MED ORDER — SODIUM CHLORIDE 0.9 % IV SOLN
372.0000 mg/m2 | Freq: Once | INTRAVENOUS | Status: AC
  Administered 2013-10-06: 800 mg via INTRAVENOUS
  Filled 2013-10-06: qty 40

## 2013-10-06 MED ORDER — VINBLASTINE SULFATE CHEMO INJECTION 1 MG/ML
6.0000 mg/m2 | Freq: Once | INTRAVENOUS | Status: AC
  Administered 2013-10-06: 13 mg via INTRAVENOUS
  Filled 2013-10-06: qty 13

## 2013-10-06 MED ORDER — DOXORUBICIN HCL CHEMO IV INJECTION 2 MG/ML
25.0000 mg/m2 | Freq: Once | INTRAVENOUS | Status: AC
  Administered 2013-10-06: 54 mg via INTRAVENOUS
  Filled 2013-10-06: qty 27

## 2013-10-06 MED ORDER — SODIUM CHLORIDE 0.9 % IV SOLN
Freq: Once | INTRAVENOUS | Status: AC
  Administered 2013-10-06: 11:00:00 via INTRAVENOUS

## 2013-10-06 MED ORDER — DEXAMETHASONE SODIUM PHOSPHATE 20 MG/5ML IJ SOLN
20.0000 mg | Freq: Once | INTRAMUSCULAR | Status: AC
  Administered 2013-10-06: 20 mg via INTRAVENOUS

## 2013-10-06 MED ORDER — HEPARIN SOD (PORK) LOCK FLUSH 100 UNIT/ML IV SOLN
500.0000 [IU] | Freq: Once | INTRAVENOUS | Status: AC | PRN
  Administered 2013-10-06: 500 [IU]
  Filled 2013-10-06: qty 5

## 2013-10-06 MED ORDER — ONDANSETRON 16 MG/50ML IVPB (CHCC)
INTRAVENOUS | Status: AC
  Filled 2013-10-06: qty 16

## 2013-10-06 MED ORDER — SODIUM CHLORIDE 0.9 % IJ SOLN
10.0000 mL | INTRAMUSCULAR | Status: DC | PRN
  Administered 2013-10-06: 10 mL
  Filled 2013-10-06: qty 10

## 2013-10-06 MED ORDER — DEXAMETHASONE SODIUM PHOSPHATE 20 MG/5ML IJ SOLN
INTRAMUSCULAR | Status: AC
  Filled 2013-10-06: qty 5

## 2013-10-06 MED ORDER — ONDANSETRON 16 MG/50ML IVPB (CHCC)
16.0000 mg | Freq: Once | INTRAVENOUS | Status: AC
  Administered 2013-10-06: 16 mg via INTRAVENOUS

## 2013-10-06 NOTE — Patient Instructions (Signed)
[facility] Discharge Instructions for Patients Receiving Chemotherapy  Today you received the following chemotherapy agents AVD.   To help prevent nausea and vomiting after your treatment, we encourage you to take your nausea medication as directed.    If you develop nausea and vomiting that is not controlled by your nausea medication, call the clinic.   BELOW ARE SYMPTOMS THAT SHOULD BE REPORTED IMMEDIATELY:  *FEVER GREATER THAN 100.5 F  *CHILLS WITH OR WITHOUT FEVER  NAUSEA AND VOMITING THAT IS NOT CONTROLLED WITH YOUR NAUSEA MEDICATION  *UNUSUAL SHORTNESS OF BREATH  *UNUSUAL BRUISING OR BLEEDING  TENDERNESS IN MOUTH AND THROAT WITH OR WITHOUT PRESENCE OF ULCERS  *URINARY PROBLEMS  *BOWEL PROBLEMS  UNUSUAL RASH Items with * indicate a potential emergency and should be followed up as soon as possible.  Feel free to call the clinic you have any questions or concerns. The clinic phone number is [phone].

## 2013-10-07 ENCOUNTER — Ambulatory Visit (HOSPITAL_BASED_OUTPATIENT_CLINIC_OR_DEPARTMENT_OTHER): Payer: Medicare Other

## 2013-10-07 VITALS — BP 121/61 | HR 84 | Temp 98.1°F

## 2013-10-07 DIAGNOSIS — Z5189 Encounter for other specified aftercare: Secondary | ICD-10-CM | POA: Diagnosis not present

## 2013-10-07 DIAGNOSIS — C8190 Hodgkin lymphoma, unspecified, unspecified site: Secondary | ICD-10-CM

## 2013-10-07 MED ORDER — PEGFILGRASTIM INJECTION 6 MG/0.6ML
6.0000 mg | Freq: Once | SUBCUTANEOUS | Status: AC
  Administered 2013-10-07: 6 mg via SUBCUTANEOUS
  Filled 2013-10-07: qty 0.6

## 2013-10-19 ENCOUNTER — Other Ambulatory Visit: Payer: Self-pay | Admitting: Family Medicine

## 2013-10-20 ENCOUNTER — Ambulatory Visit (HOSPITAL_BASED_OUTPATIENT_CLINIC_OR_DEPARTMENT_OTHER): Payer: Medicare Other | Admitting: Hematology

## 2013-10-20 ENCOUNTER — Telehealth: Payer: Self-pay | Admitting: Hematology

## 2013-10-20 ENCOUNTER — Ambulatory Visit (HOSPITAL_BASED_OUTPATIENT_CLINIC_OR_DEPARTMENT_OTHER): Payer: Medicare Other

## 2013-10-20 ENCOUNTER — Other Ambulatory Visit (HOSPITAL_BASED_OUTPATIENT_CLINIC_OR_DEPARTMENT_OTHER): Payer: Medicare Other

## 2013-10-20 VITALS — BP 154/74 | HR 102 | Temp 98.4°F | Resp 19 | Ht 65.0 in | Wt 215.4 lb

## 2013-10-20 DIAGNOSIS — D702 Other drug-induced agranulocytosis: Secondary | ICD-10-CM | POA: Diagnosis not present

## 2013-10-20 DIAGNOSIS — C8190 Hodgkin lymphoma, unspecified, unspecified site: Secondary | ICD-10-CM

## 2013-10-20 DIAGNOSIS — R21 Rash and other nonspecific skin eruption: Secondary | ICD-10-CM

## 2013-10-20 DIAGNOSIS — Z5111 Encounter for antineoplastic chemotherapy: Secondary | ICD-10-CM

## 2013-10-20 DIAGNOSIS — E139 Other specified diabetes mellitus without complications: Secondary | ICD-10-CM

## 2013-10-20 DIAGNOSIS — C189 Malignant neoplasm of colon, unspecified: Secondary | ICD-10-CM | POA: Diagnosis not present

## 2013-10-20 LAB — CBC WITH DIFFERENTIAL/PLATELET
BASO%: 2.3 % — AB (ref 0.0–2.0)
Basophils Absolute: 0.1 10*3/uL (ref 0.0–0.1)
EOS%: 4 % (ref 0.0–7.0)
Eosinophils Absolute: 0.2 10*3/uL (ref 0.0–0.5)
HEMATOCRIT: 31.9 % — AB (ref 34.8–46.6)
HGB: 10.5 g/dL — ABNORMAL LOW (ref 11.6–15.9)
LYMPH#: 0.9 10*3/uL (ref 0.9–3.3)
LYMPH%: 15.1 % (ref 14.0–49.7)
MCH: 30.9 pg (ref 25.1–34.0)
MCHC: 32.9 g/dL (ref 31.5–36.0)
MCV: 93.8 fL (ref 79.5–101.0)
MONO#: 1.2 10*3/uL — AB (ref 0.1–0.9)
MONO%: 20.5 % — ABNORMAL HIGH (ref 0.0–14.0)
NEUT#: 3.4 10*3/uL (ref 1.5–6.5)
NEUT%: 58.1 % (ref 38.4–76.8)
Platelets: 537 10*3/uL — ABNORMAL HIGH (ref 145–400)
RBC: 3.4 10*6/uL — ABNORMAL LOW (ref 3.70–5.45)
RDW: 19.6 % — ABNORMAL HIGH (ref 11.2–14.5)
WBC: 5.8 10*3/uL (ref 3.9–10.3)
nRBC: 1 % — ABNORMAL HIGH (ref 0–0)

## 2013-10-20 LAB — COMPREHENSIVE METABOLIC PANEL (CC13)
ALBUMIN: 3.4 g/dL — AB (ref 3.5–5.0)
ALK PHOS: 264 U/L — AB (ref 40–150)
ALT: 23 U/L (ref 0–55)
AST: 35 U/L — AB (ref 5–34)
Anion Gap: 10 mEq/L (ref 3–11)
BUN: 8.4 mg/dL (ref 7.0–26.0)
CO2: 27 mEq/L (ref 22–29)
Calcium: 9.1 mg/dL (ref 8.4–10.4)
Chloride: 104 mEq/L (ref 98–109)
Creatinine: 0.7 mg/dL (ref 0.6–1.1)
Glucose: 193 mg/dl — ABNORMAL HIGH (ref 70–140)
POTASSIUM: 3.8 meq/L (ref 3.5–5.1)
SODIUM: 141 meq/L (ref 136–145)
TOTAL PROTEIN: 5.9 g/dL — AB (ref 6.4–8.3)
Total Bilirubin: 0.2 mg/dL (ref 0.20–1.20)

## 2013-10-20 LAB — LACTATE DEHYDROGENASE (CC13): LDH: 283 U/L — ABNORMAL HIGH (ref 125–245)

## 2013-10-20 MED ORDER — AZITHROMYCIN 250 MG PO TABS: ORAL_TABLET | ORAL | Status: DC

## 2013-10-20 MED ORDER — DEXAMETHASONE SODIUM PHOSPHATE 20 MG/5ML IJ SOLN
INTRAMUSCULAR | Status: AC
  Filled 2013-10-20: qty 5

## 2013-10-20 MED ORDER — HYDROCOD POLST-CHLORPHEN POLST 10-8 MG/5ML PO LQCR: 5.0000 mL | Freq: Two times a day (BID) | ORAL | Status: DC

## 2013-10-20 MED ORDER — SODIUM CHLORIDE 0.9 % IV SOLN
800.0000 mg | Freq: Once | INTRAVENOUS | Status: AC
  Administered 2013-10-20: 800 mg via INTRAVENOUS
  Filled 2013-10-20: qty 40

## 2013-10-20 MED ORDER — SODIUM CHLORIDE 0.9 % IJ SOLN
10.0000 mL | INTRAMUSCULAR | Status: DC | PRN
  Filled 2013-10-20: qty 10

## 2013-10-20 MED ORDER — METFORMIN HCL ER 500 MG PO TB24: 500.0000 mg | ORAL_TABLET | Freq: Every day | ORAL | Status: DC

## 2013-10-20 MED ORDER — ONDANSETRON 16 MG/50ML IVPB (CHCC)
INTRAVENOUS | Status: AC
  Filled 2013-10-20: qty 16

## 2013-10-20 MED ORDER — VINBLASTINE SULFATE CHEMO INJECTION 1 MG/ML
6.0000 mg/m2 | Freq: Once | INTRAVENOUS | Status: AC
  Administered 2013-10-20: 13 mg via INTRAVENOUS
  Filled 2013-10-20: qty 13

## 2013-10-20 MED ORDER — DOXORUBICIN HCL CHEMO IV INJECTION 2 MG/ML
25.0000 mg/m2 | Freq: Once | INTRAVENOUS | Status: AC
  Administered 2013-10-20: 54 mg via INTRAVENOUS
  Filled 2013-10-20: qty 27

## 2013-10-20 MED ORDER — ONDANSETRON 16 MG/50ML IVPB (CHCC)
16.0000 mg | Freq: Once | INTRAVENOUS | Status: AC
  Administered 2013-10-20: 16 mg via INTRAVENOUS

## 2013-10-20 MED ORDER — SODIUM CHLORIDE 0.9 % IV SOLN
Freq: Once | INTRAVENOUS | Status: AC
  Administered 2013-10-20: 15:00:00 via INTRAVENOUS

## 2013-10-20 MED ORDER — DEXAMETHASONE SODIUM PHOSPHATE 20 MG/5ML IJ SOLN
20.0000 mg | Freq: Once | INTRAMUSCULAR | Status: AC
  Administered 2013-10-20: 20 mg via INTRAVENOUS

## 2013-10-20 MED ORDER — HEPARIN SOD (PORK) LOCK FLUSH 100 UNIT/ML IV SOLN
500.0000 [IU] | Freq: Once | INTRAVENOUS | Status: DC | PRN
Start: 2013-10-20 — End: 2013-10-20
  Filled 2013-10-20: qty 5

## 2013-10-20 NOTE — Patient Instructions (Signed)
[facility] Discharge Instructions for Patients Receiving Chemotherapy  Today you received the following chemotherapy agents Adriamycin/Velban/DTIC To help prevent nausea and vomiting after your treatment, we encourage you to take your nausea medication as prescribed.  If you develop nausea and vomiting that is not controlled by your nausea medication, call the clinic.   BELOW ARE SYMPTOMS THAT SHOULD BE REPORTED IMMEDIATELY:  *FEVER GREATER THAN 100.5 F  *CHILLS WITH OR WITHOUT FEVER  NAUSEA AND VOMITING THAT IS NOT CONTROLLED WITH YOUR NAUSEA MEDICATION  *UNUSUAL SHORTNESS OF BREATH  *UNUSUAL BRUISING OR BLEEDING  TENDERNESS IN MOUTH AND THROAT WITH OR WITHOUT PRESENCE OF ULCERS  *URINARY PROBLEMS  *BOWEL PROBLEMS  UNUSUAL RASH Items with * indicate a potential emergency and should be followed up as soon as possible.  Feel free to call the clinic you have any questions or concerns. The clinic phone number is [phone].

## 2013-10-20 NOTE — Telephone Encounter (Signed)
gv pt appt schedule for sept/oct.

## 2013-10-21 ENCOUNTER — Ambulatory Visit (HOSPITAL_BASED_OUTPATIENT_CLINIC_OR_DEPARTMENT_OTHER): Payer: Medicare Other

## 2013-10-21 ENCOUNTER — Other Ambulatory Visit: Payer: Self-pay | Admitting: *Deleted

## 2013-10-21 VITALS — BP 128/48 | HR 72 | Temp 98.3°F

## 2013-10-21 DIAGNOSIS — C8190 Hodgkin lymphoma, unspecified, unspecified site: Secondary | ICD-10-CM

## 2013-10-21 DIAGNOSIS — C184 Malignant neoplasm of transverse colon: Secondary | ICD-10-CM

## 2013-10-21 DIAGNOSIS — Z5189 Encounter for other specified aftercare: Secondary | ICD-10-CM | POA: Diagnosis not present

## 2013-10-21 MED ORDER — PEGFILGRASTIM INJECTION 6 MG/0.6ML
6.0000 mg | Freq: Once | SUBCUTANEOUS | Status: AC
  Administered 2013-10-21: 6 mg via SUBCUTANEOUS
  Filled 2013-10-21: qty 0.6

## 2013-10-21 MED ORDER — OMEPRAZOLE 40 MG PO CPDR: 40.0000 mg | DELAYED_RELEASE_CAPSULE | Freq: Two times a day (BID) | ORAL | Status: DC

## 2013-10-21 MED ORDER — PEGFILGRASTIM INJECTION 6 MG/0.6ML
6.0000 mg | Freq: Once | SUBCUTANEOUS | Status: DC
  Filled 2013-10-21: qty 0.6

## 2013-10-23 ENCOUNTER — Encounter: Payer: Self-pay | Admitting: Hematology

## 2013-10-23 NOTE — Progress Notes (Signed)
[place] ONCOLOGY OFFICE PROGRESS NOTE 10/20/2013  [name], MD [address] [place] [address]  DIAGNOSIS: Hodgkin Lymphoma on Chemotherapy for follow up.  PRIOR ONCOLOGIC HISTORY:  1. Marginal Zone Lymphoma 2. Colon Cancer  CURRENT THERAPY:  Started AVD (without Bleomycin due to moderate restriction per PFTs) on 06/08/2013.  Cycle 1A was dose-reduced due to elevated liver function.   INTERVAL HISTORY:  Diamond Boyd 73 y.o. 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   She was last seen by Awilda Metro on 09/22/2013.  She is accompanied by her husband Ray.   She tolerated her first cycle of AVD without Bleo on 06/08/2013 and had delayed count recovery requiring her to not receive AVD on 05/13.  She received neupogen on 05/13 and received cycle #1B on 05/20. She has a rash underneath her breasts.  She reports that her bone pain improved with oxycodone 5 mg as needed.  She denies fevers or night sweats while on chemotherapy.     She denies specifically any pain, difficulty eating, trouble with her bowels, blood in the stool.   She has completed 4 cycles and she gets treatment day 1 and 15 and gets neulasta on day 2 and day 16 of each cycle. After 6 cycles, she will get a restaging PET scan and I am also thinking of getting an opinion from [facility] for post-induction therapy. Likely not a transplant candidate but perhaps good candidate for Brentuximab in future at the time of relapse. She has ongoing persistent hyperglycemia likely sec to chemotherapy and some of the blood sugars we recorded (non-fasting) in last 3 months are 241,197,172,138,119,121, 193 today. I will add metformin 500 mg daily  to her medications. She also have some cough/bronchitis and I gave her a Z-pak script today. She was also given the cough syrup script.  MEDICAL HISTORY: Past Medical History  Diagnosis Date  . Colon polyps   . Depression   . Hypothyroidism   . Osteoarthritis     hands  . Peripheral vascular disease   . Degenerative disk disease     spine in center in past   . 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 Cancer     skin cancer- basal cell on arm / colon 2011  . Lymphoma   . Colon cancer     08-2009    INTERIM HISTORY: has MALIGNANT NEOPLASM OF TRANSVERSE COLON; LYMPHOMA NEC, MLIG, SPLEEN; HYPERCHOLESTEROLEMIA, PURE; DISORDERS OF PHOSPHORUS METABOLISM; Anxiety state, unspecified; DEPRESSION; PERIPHERAL VASCULAR DISEASE; Other chronic sinusitis; ALLERGIC  RHINITIS; GERD; OVERACTIVE BLADDER; MENOPAUSAL SYNDROME; OSTEOARTHRITIS; LEG EDEMA; THROAT PAIN, CHRONIC; SKIN CANCER, HX OF; COLONIC POLYPS, HX OF; Elevated blood pressure; Obesity; Hypothyroid; Varicose veins; Other asplenic status; History of anemia; Other screening mammogram; Routine gynecological examination; Colon cancer screening; Skin lesion of face; Thoracic back pain; Left knee pain; Encounter for Medicare annual wellness exam; Cystocele; Herpes zoster; Vaginal pain; Other malaise and fatigue; Acute sinusitis; Left temporal headache; Hyperglycemia; Conjunctivitis, acute; Cold sore; Hodgkin lymphoma; Dysuria; Colon cancer; Fever; Drug induced neutropenia(288.03); Candidal intertrigo; and Atrophic vaginitis on her problem list.    ALLERGIES:  is allergic to achromycin; allopurinol; astelin; cephalexin; codeine; meloxicam; minocycline; nabumetone; nyquil; penicillins; sulfa antibiotics;  zolpidem tartrate; buspar; and ciprofloxacin.  MEDICATIONS: has a current medication list which includes the following prescription(s): acyclovir, calcium carbonate-vitamin d, celecoxib, cholecalciferol, cvs allergy relief, epipen 2-pak, estradiol, fexofenadine,  fluoxetine, fluticasone, furosemide, levothyroxine, lidocaine-prilocaine, lorazepam, nystatin, nystatin, omega-3, ondansetron, oxycodone, prochlorperazine, psyllium, simvastatin, tobramycin-dexamethasone, vitamin e, azithromycin, chlorpheniramine-hydrocodone, metformin, and omeprazole, and the following Facility-Administered Medications: sodium chloride.  SURGICAL HISTORY:  Past Surgical History  Procedure Laterality Date  . Cholecystectomy    . Splenectomy      lymphoma  . Breast biopsy  1996  . Plantar fascia release    . Ventral hernia repair  1998  . Tendon release      Right thumb   . Colectomy  8/11  . Vaginal hysterectomy    . Bone marrow biopsy  05/27/13   PROBLEM LIST:  1. Adenocarcinoma of the colon with 2 synchronous primaries dating back to August 2011 when the patient was found to have anemia. Both tumors were associated with negative lymph nodes. One tumor was T2 N0. The other tumor was T3 N0. The T2 N0 stage I tumor was in the sigmoid colon. The T3 N0 stage II tumor was in the transverse colon. The smaller tumor measured 1.5 cm. The larger tumor measured 6.5 cm. Surgery was carried out on 09/28/2009. 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  2. History of marginal zone lymphoma probably dating back to 92. The patient had bone marrow involvement and splenomegaly at that time, also a hemolytic anemia. She underwent a splenectomy on November 12, 1994. Through the years, the patient has received Rituxan most recently, dating back to May 2006. The patient received fludarabine in late 2002 and early 2003. She may have developed a pneumonitis from the fludarabine in December 2002. The patient has had involvement of the peripheral blood with villous lymphocytes. She appears to be in a state of complete clinical remission at this time. Tumor is CD5 and CD20 positive, also CD11c and FMC7 positive.  3. History of splenectomy on  November 12, 1994.  4. History of lupus anticoagulant dating back to the 1990s.  5. History of iron deficiency anemia for which the patient received IV Feraheme in January 2012. Presumably, this occurred as a result of her colon cancer.  6. History of GERD resulting in cough diagnosed around 2010.  7. Dyslipidemia.  8. Hypothyroidism.  9. Irritable bowel syndrome.  10. Goiter seen on PET scan from 09/04/2009.  11. Enlarged left tonsil diagnosed April 2011, currently resolved.  12. History of depression.   REVIEW OF SYSTEMS:   Constitutional: Denies fevers, chills or abnormal weight loss,+hyperglycemia Eyes: Denies blurriness of vision Ears, nose, mouth, throat, and face: Denies mucositis or sore throat Respiratory: +cough, dyspnea or wheezes Cardiovascular: Denies palpitation, chest discomfort or lower extremity swelling Gastrointestinal:  Denies nausea, heartburn or change in bowel habits Skin: Denies abnormal skin rashes Lymphatics: Denies new lymphadenopathy or easy bruising Neurological:Denies numbness, tingling or new weaknesses Behavioral/Psych: Mood is stable, no new changes  All other systems were reviewed with the patient and are negative.  PHYSICAL EXAMINATION: ECOG PERFORMANCE STATUS: 1 - Symptomatic but completely ambulatory  Blood pressure 154/74, pulse 102, temperature 98.4 F (36.9 C), temperature source Oral, resp. rate 19, height 5' 5"  (1.651 m), weight 215 lb 6.4 oz (97.705 kg), last menstrual period 02/10/1970.  GENERAL:alert, mildly distressed but comfortable; moderately obese. Non-toxic appearing; alopecia SKIN: skin color, texture, turgor are normal, no  significant  lesions; small reddish bumps on anterior frontal scalp.   EYES: normal, Conjunctiva are pink, sclera clear OROPHARYNX:no exudate, no erythema and lips, buccal mucosa, and tongue normal  NECK: supple, thyroid normal size, non-tender, without nodularity.  Left neck adenopathy resolved LYMPH:  no  palpable lymphadenopathy in the cervical, axillary or supraclavicular LUNGS: clear to auscultation and percussion with normal breathing effort HEART: regular rate & rhythm and no murmurs and no lower extremity edema ABDOMEN:abdomen soft, non-tender and normal bowel sounds; multiple surgical scars well healed.   Musculoskeletal:no cyanosis of digits and no clubbing  NEURO: alert & oriented x 3 with fluent speech, no focal motor/sensory deficits  Labs:  Lab Results  Component Value Date   WBC 5.8 10/20/2013   HGB 10.5* 10/20/2013   HCT 31.9* 10/20/2013   MCV 93.8 10/20/2013   PLT 537* 10/20/2013   NEUTROABS 3.4 10/20/2013      Chemistry      Component Value Date/Time   NA 141 10/20/2013 1228   NA 131* 06/07/2013 2300   K 3.8 10/20/2013 1228   K 4.1 06/07/2013 2300   CL 92* 06/07/2013 2300   CL 105 07/14/2012 0939   CO2 27 10/20/2013 1228   CO2 21 06/07/2013 2300   BUN 8.4 10/20/2013 1228   BUN 17 06/07/2013 2300   CREATININE 0.7 10/20/2013 1228   CREATININE 0.74 06/07/2013 2300      Component Value Date/Time   CALCIUM 9.1 10/20/2013 1228   CALCIUM 9.3 06/07/2013 2300   CALCIUM 9.3 08/09/2009 0000   ALKPHOS 264* 10/20/2013 1228   ALKPHOS 529* 06/07/2013 2358   AST 35* 10/20/2013 1228   AST 121* 06/07/2013 2358   ALT 23 10/20/2013 1228   ALT 46* 06/07/2013 2358   BILITOT 0.20 10/20/2013 1228   BILITOT 2.5* 06/07/2013 2358     IMAGING STUDIES:  1. Digital screening mammogram from 08/05/2010 was negative.  2. Chest x-ray, 2 view, from 12/09/2010 was negative.  3. Digital screening mammogram on 08/20/2011 showed no specific evidence of malignancy  4. Chest x-ray, 2 view, from 11/19/2011 showed atherosclerotic calcifications within the arch of the aorta. There was evidence of a prior cholecystectomy and splenectomy, otherwise chest x-ray was negative.  5. CT scan of abdomen and pelvis with IV contrast obtained on 01/21/2012 showed no clear evidence of colon cancer metastasis. A retrocrural lymph node  had increased in size but periportal adenopathy and periaortic adenopathy was stable to decreased. The retrocrural lymph node refer to measures 8 mm on image 12, increased from 5 mm on a prior study.  6. Chest x-ray, 2 view, from 06/01/2012 showed no acute abnormalities. There was borderline enlargement of the cardiac silhouette.  7. Chest x-ray, 2 view, from 03/09/2013, showed new pulmonary nodules in the right lung. Cannot rule out  metastatic disease. Correlation with CT recommended. (reviewed by me) 8. CT scan of abdomen and pelvis with IV contrast on 03/09/2013 showed small right lower lobe pulmonary nodules measuring up to 8 mm, new, suspicious for metastases. Small right juxtadiaphragmatic, upper abdominal, retroperitoneal, and left pelvic lymph nodes, stable versus mildly increased. Prior splenectomy, cholecystectomy, and right hemicolectomy. PET-CT is suggested for further evaluation.  (reviewed by me) 9. PET/CT on 03/21/2013 revealed intensely hypermetabolic nodule within the left lobe of thyroid gland. Concern for thyroid carcinoma. 2. Two intensely hypermetabolic right lower lobe pulmonary nodules. Differential includes thyroid cancer metastasis or colon cancer metastasis. 3. Intense uptake within the right sacrum is concerning metastasis. Milder abnormal uptake within  the T9 vertebral body is indeterminate but concerning for metastasis. 10. PET on 08/17/2013 showed an interval decrease in size and hypermetabolism of the right lower lobe pulmonary nodule seen on the previous study, consistent with interval response to therapy.  PROCEDURES:  Colonoscopy was carried out by Dr. Erskine Emery on 10/10/2010.  PATHOLOGY:  Lung, needle/core biopsy(ies), RLL sup seg nodule pet+ - MALIGNANT LYMPHORETICULAR NEOPLASM, MOST CONSISTENT WITH CLASSICAL HODGKIN LYMPHOMA. - SEE COMMENT. Diagnosis Note The core biopsies reveal a diffuse lymphohistiocytic infiltrate. There are scattered Hodgkin Reed-Sternberg like  cells with prominent nuclei. There are rare lacunar like cells. There are no significant eosinophils or plasma cells seen in the background. Immunohistochemistry reveals scattered CD30 positive cells with golgi staining. The cells are also positive for CD15 and PAX-5. CD45 interpretation is hampered by diffuse background T-cells. CD3 and CD43 highlight abundant small T-cells. Only scattered B-cells are seen with CD79a and CD20. EBV in situ (EBER) is negative. CDX-2, cytokeratin 7 and cytokeratin 20 are negative for carcinoma. Overall, these findings are consistent with extranodal pulmonary involvement by classical Hodgkin lymphoma. The case was sent to [facility] for consultation (report [phone]), who agrees with the above interpretation. Dr. Juliann Mule was paged on 05/19/2013.  05/26/2013  Bone Marrow, Aspirate,Biopsy, and Clot, right iliac - HYPERCELLULAR BONE MARROW WITH ATYPICAL LYMPHOHISTIOCYTIC PROLIFERATION. - TRILINEAGE HEMATOPOIESIS.- SEE COMMENT.PERIPHERAL BLOOD:- NORMOCYTIC-NORMOCHROMIC ANEMIA. - MONOCYTOSIS.Diagnosis Note The aspirate material is extremely limited for morphologic evaluation. However, the core biopsy shows numerous variably sized and ill-defined interstitial and paratrabecular atypical lymphohistiocytic aggregates mostly composed of small lymphocytes and histiocytes in addition to scattering of larger CD30 positive lymphoid appearing cells. The histologic and phenotypic features are similar to previously diagnosed classical Hodgkin's lymphoma ([id number]) and consistent with marrow involvement by the same disease process. (BNS:ecj/gt 05/27/2013)BASSAM SMIR MD Pathologist, Electronic Signature (Case signed 05/31/2013)  ASSESSMENT: [name] 73 y.o. female with a history of Hodgkin Lymphoma + B symptoms including (Fevers, night sweats and weight lost) who is getting treatment with AVD regimen (ABVD minus bleomycin) and getting neulasta for white count  support. She also has prior history of MZL and CRC. She starts her day 1 cycle 5 today.  PLAN:  1. Chemotherapy-induced neutropenia. -- Patients WBC have recovered and ANC is 3400 today.   2. H.o of Marginal zone lymphoma now with multiple lung nodules consistent with Hodgkin's lymphoma (Stage IV with bone marrow involvement) with B symptoms.   --Previously, we had an extensive discussion and review of her pulmonary pathology and final report conclusive for classical hodgkin's disease with bone marrow invovlement.  She reported fevers night sweats which would be considered a constitutional B symptoms. Her ESR is 84 (high).  Based on advanced lymphoma, she received a bone marrow biopsy which demonstrated involvement of Hodgkin's lymphoma.  Her echo also was obtained demonstrating a normal EF.  She received a R port-a-cath.   Her PFTs  demonstrated a moderate restriction (likely secondary to presence of the disease).   --We started chemotherapy with AVD x 6 cycles (category 2A) q28 days (doxorubicin, vinblastine, and dacarbazine) (Duggan DB, JCO, 2003).  Based on moderate restriction component of her PFTs, she is not getting bleomycin.     Day 1, Cycle 5A 10/20/2013  Doxorubicin (Adriamycin) 25 mg/m2  Vinblastine 6 mg/m2  Dacarbazine 375 mg/m2   -She will have Day1, Cycle 5B on 09/24.  We will give neulasta on Day #2 of each cycle due to delayed count recovery. We obtained  a restaging PET-CT following Cycle #2 (NCCN Guidelines, Version 2.2014, HODG-9) to assess response and further treatment based on [place] criteria for response. It demonstrated an interval decrease in size and hypermetabolism of the right lower lobe pulmonary nodule seen on the previous study, consistent with interval response to therapy.  --Her LDH and ESR has normalized.  Her creatinine is normal. Her calcium and serum potassium is normal. She was given anti-emetics and in addition we continue acyclovir 500 mg bid prophylaxis.    She has a listed allergy to allopurinol.   --After 6 cycles we will be ordering a PET scan and arranging a consultation to [facility] as well.  3. Colon cancer.   --Ilisha remains clinically stable. Her last colonoscopy was on  10/10/2010 by Dr. Deatra Ina.  We will make Dr. Deatra Ina of aware. CEA is stable at 1.9.     4. Thyroid nodule.    --Her PET/CT was also concerning for thyroid carcinoma. TSH on 01/04/2013 was 0.51.  Biopsy was negative for malignancy.   5. Right eye irritation, resolved -- She is being followed by dermatology and opthamology.  She reports substantial improvement.   6. Skin irritation beneath fold of breast and pannus. --Counseled to continue anti-fungal powder and keep area as dry as possible.   7.Diabetes sec to medications and chemotherapy. --Recommending adding metformin 500 mg daily to her medications.   8. Follow-up.  --We will plan to see Diamond Boyd again in 2 weeks and do close follow ups with remaining of her chemotherapy. Now is a time she is going to get cumulative toxicities and will need close monitoring until she gets 6 cycles completed.   All questions were answered. The patient knows to call the clinic with any problems, questions or concerns. We can certainly see the patient much sooner if necessary.  I spent 25 minutes counseling the patient face to face. The total time spent in the appointment was 35 minutes.    Bernadene Bell, MD Medical Hematologist/Oncologist [place] Pager: [phone] Office No: [phone]

## 2013-11-02 ENCOUNTER — Telehealth: Payer: Self-pay | Admitting: Hematology

## 2013-11-02 ENCOUNTER — Telehealth: Payer: Self-pay | Admitting: *Deleted

## 2013-11-02 NOTE — Telephone Encounter (Signed)
MOVED LB/FU/TX TO 11:30AM. DUE TO AS WILL NOT BE IN UNTIL 10AM. S/W PT HUSBAND RE CHANGE AND NEW TIME FOR 9/24 APPTS @ 11:30AM.

## 2013-11-02 NOTE — Telephone Encounter (Signed)
I have moved appt for tomorrow per scheruler

## 2013-11-03 ENCOUNTER — Telehealth: Payer: Self-pay | Admitting: *Deleted

## 2013-11-03 ENCOUNTER — Encounter: Payer: Self-pay | Admitting: Hematology

## 2013-11-03 ENCOUNTER — Ambulatory Visit (HOSPITAL_BASED_OUTPATIENT_CLINIC_OR_DEPARTMENT_OTHER): Payer: Medicare Other | Admitting: Hematology

## 2013-11-03 ENCOUNTER — Other Ambulatory Visit (HOSPITAL_BASED_OUTPATIENT_CLINIC_OR_DEPARTMENT_OTHER): Payer: Medicare Other

## 2013-11-03 ENCOUNTER — Ambulatory Visit (HOSPITAL_BASED_OUTPATIENT_CLINIC_OR_DEPARTMENT_OTHER): Payer: Medicare Other

## 2013-11-03 VITALS — BP 147/65 | HR 88 | Temp 98.2°F | Resp 18 | Ht 65.0 in | Wt 211.3 lb

## 2013-11-03 VITALS — BP 151/58 | HR 80 | Resp 18

## 2013-11-03 DIAGNOSIS — D702 Other drug-induced agranulocytosis: Secondary | ICD-10-CM | POA: Diagnosis not present

## 2013-11-03 DIAGNOSIS — C8190 Hodgkin lymphoma, unspecified, unspecified site: Secondary | ICD-10-CM | POA: Diagnosis not present

## 2013-11-03 DIAGNOSIS — E041 Nontoxic single thyroid nodule: Secondary | ICD-10-CM | POA: Diagnosis not present

## 2013-11-03 DIAGNOSIS — Z5111 Encounter for antineoplastic chemotherapy: Secondary | ICD-10-CM | POA: Diagnosis not present

## 2013-11-03 DIAGNOSIS — N6489 Other specified disorders of breast: Secondary | ICD-10-CM | POA: Diagnosis not present

## 2013-11-03 DIAGNOSIS — C189 Malignant neoplasm of colon, unspecified: Secondary | ICD-10-CM

## 2013-11-03 DIAGNOSIS — I10 Essential (primary) hypertension: Secondary | ICD-10-CM

## 2013-11-03 DIAGNOSIS — E139 Other specified diabetes mellitus without complications: Secondary | ICD-10-CM | POA: Diagnosis not present

## 2013-11-03 LAB — COMPREHENSIVE METABOLIC PANEL (CC13)
ALK PHOS: 237 U/L — AB (ref 40–150)
ALT: 20 U/L (ref 0–55)
ANION GAP: 9 meq/L (ref 3–11)
AST: 37 U/L — ABNORMAL HIGH (ref 5–34)
Albumin: 3.4 g/dL — ABNORMAL LOW (ref 3.5–5.0)
BUN: 7.6 mg/dL (ref 7.0–26.0)
CO2: 28 mEq/L (ref 22–29)
Calcium: 9.5 mg/dL (ref 8.4–10.4)
Chloride: 105 mEq/L (ref 98–109)
Creatinine: 0.7 mg/dL (ref 0.6–1.1)
GLUCOSE: 129 mg/dL (ref 70–140)
Potassium: 3.9 mEq/L (ref 3.5–5.1)
SODIUM: 142 meq/L (ref 136–145)
TOTAL PROTEIN: 5.9 g/dL — AB (ref 6.4–8.3)
Total Bilirubin: 0.26 mg/dL (ref 0.20–1.20)

## 2013-11-03 LAB — CBC WITH DIFFERENTIAL/PLATELET
BASO%: 2.5 % — ABNORMAL HIGH (ref 0.0–2.0)
Basophils Absolute: 0.1 10*3/uL (ref 0.0–0.1)
EOS ABS: 0.2 10*3/uL (ref 0.0–0.5)
EOS%: 5.1 % (ref 0.0–7.0)
HCT: 32.2 % — ABNORMAL LOW (ref 34.8–46.6)
HGB: 10.4 g/dL — ABNORMAL LOW (ref 11.6–15.9)
LYMPH#: 1 10*3/uL (ref 0.9–3.3)
LYMPH%: 20.1 % (ref 14.0–49.7)
MCH: 30.5 pg (ref 25.1–34.0)
MCHC: 32.3 g/dL (ref 31.5–36.0)
MCV: 94.4 fL (ref 79.5–101.0)
MONO#: 1.1 10*3/uL — AB (ref 0.1–0.9)
MONO%: 22.4 % — ABNORMAL HIGH (ref 0.0–14.0)
NEUT%: 49.9 % (ref 38.4–76.8)
NEUTROS ABS: 2.4 10*3/uL (ref 1.5–6.5)
NRBC: 2 % — AB (ref 0–0)
Platelets: 533 10*3/uL — ABNORMAL HIGH (ref 145–400)
RBC: 3.41 10*6/uL — AB (ref 3.70–5.45)
RDW: 19.7 % — AB (ref 11.2–14.5)
WBC: 4.7 10*3/uL (ref 3.9–10.3)

## 2013-11-03 LAB — LACTATE DEHYDROGENASE (CC13): LDH: 305 U/L — AB (ref 125–245)

## 2013-11-03 MED ORDER — SODIUM CHLORIDE 0.9 % IJ SOLN
10.0000 mL | INTRAMUSCULAR | Status: DC | PRN
  Administered 2013-11-03: 10 mL
  Filled 2013-11-03: qty 10

## 2013-11-03 MED ORDER — HEPARIN SOD (PORK) LOCK FLUSH 100 UNIT/ML IV SOLN
500.0000 [IU] | Freq: Once | INTRAVENOUS | Status: AC | PRN
  Administered 2013-11-03: 500 [IU]
  Filled 2013-11-03: qty 5

## 2013-11-03 MED ORDER — DEXAMETHASONE SODIUM PHOSPHATE 20 MG/5ML IJ SOLN
INTRAMUSCULAR | Status: AC
  Filled 2013-11-03: qty 5

## 2013-11-03 MED ORDER — VINBLASTINE SULFATE CHEMO INJECTION 1 MG/ML
6.0000 mg/m2 | Freq: Once | INTRAVENOUS | Status: AC
  Administered 2013-11-03: 13 mg via INTRAVENOUS
  Filled 2013-11-03: qty 13

## 2013-11-03 MED ORDER — SODIUM CHLORIDE 0.9 % IV SOLN
370.0000 mg/m2 | Freq: Once | INTRAVENOUS | Status: AC
  Administered 2013-11-03: 800 mg via INTRAVENOUS
  Filled 2013-11-03: qty 40

## 2013-11-03 MED ORDER — DOXORUBICIN HCL CHEMO IV INJECTION 2 MG/ML
25.0000 mg/m2 | Freq: Once | INTRAVENOUS | Status: AC
  Administered 2013-11-03: 54 mg via INTRAVENOUS
  Filled 2013-11-03: qty 27

## 2013-11-03 MED ORDER — ONDANSETRON 16 MG/50ML IVPB (CHCC)
INTRAVENOUS | Status: AC
  Filled 2013-11-03: qty 16

## 2013-11-03 MED ORDER — ONDANSETRON 16 MG/50ML IVPB (CHCC)
16.0000 mg | Freq: Once | INTRAVENOUS | Status: AC
  Administered 2013-11-03: 16 mg via INTRAVENOUS

## 2013-11-03 MED ORDER — SODIUM CHLORIDE 0.9 % IV SOLN
Freq: Once | INTRAVENOUS | Status: AC
  Administered 2013-11-03: 13:00:00 via INTRAVENOUS

## 2013-11-03 MED ORDER — DEXAMETHASONE SODIUM PHOSPHATE 20 MG/5ML IJ SOLN
20.0000 mg | Freq: Once | INTRAMUSCULAR | Status: AC
  Administered 2013-11-03: 20 mg via INTRAVENOUS

## 2013-11-03 NOTE — Progress Notes (Signed)
1357: During adriamycin push patient reported feeling "like i'm being smothered." VSS throughout, alert, oriented, talkative. Pt appeared in no acute distress. Adria paused, saline infused while vitals obtained. After approx 5 minutes patient reported feeling better. Adria resumed for complete dose.  1418: patient states symptoms have completely resolved.  1428: Dr. Lona Kettle notified. no new orders.

## 2013-11-03 NOTE — Progress Notes (Signed)
[facility] ONCOLOG[facility] OFFICE PROGRESS NOTE 11/03/2013  Diamond Pardon, MD [address] [place] [id number]  DIAGNOSIS: Hodgkin Lymphoma on Chemotherapy for follow up.  PRIOR ONCOLOGIC HISTORY:  1. Marginal Zone Lymphoma 2. Colon Cancer  CURRENT THERAPY:  Started AVD (without Bleomycin due to moderate restriction per PFTs) on 06/08/2013.  Cycle 1A was dose-reduced due to elevated liver function. Today is C5,D15. 2 more cycles planned after this.  INTERVAL HISTORY:  Diamond Boyd 74 y.o. 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   She was last seen by me on 10/20/2013.  She is accompanied by her husband Ray.   She tolerated her first cycle of AVD without Bleo on 06/08/2013 and had delayed count recovery requiring her to not receive AVD on 05/13.  She received neupogen on 05/13 and received cycle #1B on 05/20. She has a rash underneath her breasts.  She reports that her bone pain improved with oxycodone 5 mg as needed.  She denies fevers or night sweats while on chemotherapy.     She denies specifically any pain, difficulty eating, trouble with her bowels, blood in the stool.   She has completed 4 cycles and she gets treatment day 1 and 15 and gets neulasta on day 2 and day 16 of each cycle. After 6 cycles, she will get a restaging PET scan and I am also thinking of getting an opinion from [facility] for post-induction therapy. Likely not a transplant candidate but perhaps good candidate for Brentuximab in future at the time of relapse. She has ongoing persistent hyperglycemia likely sec to chemotherapy and some of the blood sugars we recorded (non-fasting) in last 3 months are 241,197,172,138,119,121, 193  today. I will add metformin 500 mg daily to her medications. She also have some cough/bronchitis and she was given Z-pak script 2 weeks ago and she feels better. CBC looks fine and I signed chemotherapy orders today.  MEDICAL HISTORY: Past Medical History  Diagnosis Date  . Colon polyps   . Depression   . Hypothyroidism   . Osteoarthritis     hands  . Peripheral vascular disease   . Degenerative disk disease     spine in center in past   . 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 Cancer     skin cancer- basal cell on arm / colon 2011  . Lymphoma   . Colon cancer     08-2009    INTERIM HISTORY: has MALIGNANT NEOPLASM OF TRANSVERSE COLON; LYMPHOMA NEC, MLIG, SPLEEN; HYPERCHOLESTEROLEMIA, PURE; DISORDERS OF PHOSPHORUS METABOLISM; Anxiety state, unspecified; DEPRESSION; PERIPHERAL VASCULAR DISEASE; Other chronic sinusitis; ALLERGIC  RHINITIS; GERD; OVERACTIVE BLADDER; MENOPAUSAL SYNDROME; OSTEOARTHRITIS; LEG EDEMA; THROAT PAIN, CHRONIC; SKIN CANCER, HX OF; COLONIC POLYPS, HX OF; Elevated blood pressure; Obesity; Hypothyroid; Varicose veins; Other asplenic status; History of anemia; Other screening mammogram; Routine gynecological examination; Colon cancer screening; Skin lesion of face; Thoracic back pain; Left knee pain; Encounter for Medicare annual wellness exam; Cystocele; Herpes zoster; Vaginal pain; Other malaise and fatigue; Acute sinusitis; Left temporal headache; Hyperglycemia; Conjunctivitis, acute; Cold sore; Hodgkin lymphoma; Dysuria; Colon cancer; Fever; Drug induced neutropenia(288.03); Candidal intertrigo; and Atrophic vaginitis on her problem list.    ALLERGIES:  is allergic  to achromycin; allopurinol; astelin; cephalexin; codeine; meloxicam; minocycline; nabumetone; nyquil; penicillins; sulfa antibiotics; zolpidem tartrate; buspar; and ciprofloxacin.  MEDICATIONS: has a current medication list which includes the following prescription(s): acyclovir, azithromycin, calcium  carbonate-vitamin d, celecoxib, chlorpheniramine-hydrocodone, cholecalciferol, cvs allergy relief, epipen 2-pak, estradiol, fexofenadine, fluoxetine, fluticasone, furosemide, levothyroxine, lidocaine-prilocaine, lorazepam, metformin, nystatin, nystatin, omega-3, omeprazole, ondansetron, oxycodone, prochlorperazine, psyllium, simvastatin, tobramycin-dexamethasone, and vitamin e, and the following Facility-Administered Medications: sodium chloride.  SURGICAL HISTORY:  Past Surgical History  Procedure Laterality Date  . Cholecystectomy    . Splenectomy      lymphoma  . Breast biopsy  1996  . Plantar fascia release    . Ventral hernia repair  1998  . Tendon release      Right thumb   . Colectomy  8/11  . Vaginal hysterectomy    . Bone marrow biopsy  05/27/13   PROBLEM LIST:  1. Adenocarcinoma of the colon with 2 synchronous primaries dating back to August 2011 when the patient was found to have anemia. Both tumors were associated with negative lymph nodes. One tumor was T2 N0. The other tumor was T3 N0. The T2 N0 stage I tumor was in the sigmoid colon. The T3 N0 stage II tumor was in the transverse colon. The smaller tumor measured 1.5 cm. The larger tumor measured 6.5 cm. Surgery was carried out on 09/28/2009. 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  2. History of marginal zone lymphoma probably dating back to 14. The patient had bone marrow involvement and splenomegaly at that time, also a hemolytic anemia. She underwent a splenectomy on November 12, 1994. Through the years, the patient has received Rituxan most recently, dating back to May 2006. The patient received fludarabine in late 2002 and early 2003. She may have developed a pneumonitis from the fludarabine in December 2002. The patient has had involvement of the peripheral blood with villous lymphocytes. She appears to be in a state of complete clinical remission at this time.  Tumor is CD5 and CD20 positive, also CD11c and FMC7 positive.  3. 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  4. History of lupus anticoagulant dating back to the 1990s.  5. History of iron deficiency anemia for which the patient received IV Feraheme in January 2012. Presumably, this occurred as a result of her colon cancer.  6. History of GERD resulting in cough diagnosed around 2010.  7. Dyslipidemia.  8. Hypothyroidism.  9. Irritable bowel syndrome.  10. Goiter seen on PET scan from 09/04/2009.  11. Enlarged left tonsil diagnosed April 2011, currently resolved.  12. History of depression.   REVIEW OF SYSTEMS:   Constitutional: Denies fevers, chills or abnormal weight loss,+hyperglycemia Eyes: Denies blurriness of vision, mild irritation noted in eyes. Ears, nose, mouth, throat, and face: Denies mucositis or sore throat Respiratory: +cough but better cf 2 weeks ago, no dyspnea or wheezes Cardiovascular: Denies palpitation, chest discomfort or lower extremity swelling Gastrointestinal:  Denies nausea, heartburn or change in bowel habits Skin: Denies abnormal skin rashes Lymphatics: Denies new lymphadenopathy or easy bruising Neurological:+numbness in feet not in hands, no tingling or new weaknesses Behavioral/Psych: Mood is stable, no new changes  All other systems were reviewed with the patient and are negative.  PHYSICAL EXAMINATION: ECOG PERFORMANCE STATUS: 1  Blood pressure 147/65, pulse 88, temperature 98.2 F (36.8 C), temperature source Oral, resp. rate 18, height _0  (1.651 m), weight 211 lb 4.8 oz (95.845 kg),  last menstrual period 02/10/1970.  GENERAL:alert, mildly distressed but comfortable; moderately obese. Non-toxic appearing; alopecia SKIN: skin color, texture, turgor are normal, no  significant lesions; small reddish bumps on anterior frontal scalp.   EYES: normal, Conjunctiva are pink, sclera clear OROPHARYNX:no exudate, no erythema and lips, buccal mucosa, and  tongue normal  NECK: supple, thyroid normal size, non-tender, without nodularity.  Left neck adenopathy resolved LYMPH:  no palpable lymphadenopathy in the cervical, axillary or supraclavicular LUNGS: clear to auscultation and percussion with normal breathing effort HEART: regular rate & rhythm and no murmurs and no lower extremity edema ABDOMEN:abdomen soft, non-tender and normal bowel sounds; multiple surgical scars well healed.   Musculoskeletal:no cyanosis of digits and no clubbing  NEURO: alert & oriented x 3 with fluent speech, no focal motor/sensory deficits  Labs:         IMAGING STUDIES:  1. Digital screening mammogram from 08/05/2010 was negative.  2. Chest x-ray, 2 view, from 12/09/2010 was negative.  3. Digital screening mammogram on 08/20/2011 showed no specific evidence of malignancy  4. Chest x-ray, 2 view, from 11/19/2011 showed atherosclerotic calcifications within the arch of the aorta. There was evidence of a prior cholecystectomy and splenectomy, otherwise chest x-ray was negative.  5. CT scan of abdomen and pelvis with IV contrast obtained on 01/21/2012 showed no clear evidence of colon cancer metastasis. A retrocrural lymph node had increased in size but periportal adenopathy and periaortic adenopathy was stable to decreased. The retrocrural lymph node refer to measures 8 mm on image 12, increased from 5 mm on a prior study.  6. Chest x-ray, 2 view, from 06/01/2012 showed no acute abnormalities. There was borderline enlargement of the cardiac silhouette.  7. Chest x-ray, 2 view, from 03/09/2013, showed new pulmonary nodules in the right lung. Cannot rule out  metastatic disease. Correlation with CT recommended. (reviewed by me) 8. CT scan of abdomen and pelvis with IV contrast on 03/09/2013 showed small right lower lobe pulmonary nodules measuring up to 8 mm, new, suspicious for metastases. Small right juxtadiaphragmatic, upper abdominal, retroperitoneal, and left  pelvic lymph nodes, stable versus mildly increased. Prior splenectomy, cholecystectomy, and right hemicolectomy. PET-CT is suggested for further evaluation.  (reviewed by me) 9. PET/CT on 03/21/2013 revealed intensely hypermetabolic nodule within the left lobe of thyroid gland. Concern for thyroid carcinoma. 2. Two intensely hypermetabolic right lower lobe pulmonary nodules. Differential includes thyroid cancer metastasis or colon cancer metastasis. 3. Intense uptake within the right sacrum is concerning metastasis. Milder abnormal uptake within the T9 vertebral body is indeterminate but concerning for metastasis. 10. PET on 08/17/2013 showed an interval decrease in size and hypermetabolism of the right lower lobe pulmonary nodule seen on the previous study, consistent with interval response to therapy.  PROCEDURES:  Colonoscopy was carried out by Dr. Erskine Emery on 10/10/2010.  PATHOLOGY:  Lung, needle/core biopsy(ies), RLL sup seg nodule pet+ - MALIGNANT LYMPHORETICULAR NEOPLASM, MOST CONSISTENT WITH CLASSICAL HODGKIN LYMPHOMA. - SEE COMMENT. Diagnosis Note The core biopsies reveal a diffuse lymphohistiocytic infiltrate. There are scattered Hodgkin Reed-Sternberg like cells with prominent nuclei. There are rare lacunar like cells. There are no significant eosinophils or plasma cells seen in the background. Immunohistochemistry reveals scattered CD30 positive cells with golgi staining. The cells are also positive for CD15 and PAX-5. CD45 interpretation is hampered by diffuse background T-cells. CD3 and CD43 highlight abundant small T-cells. Only scattered B-cells are seen with CD79a and CD20. EBV in situ (EBER) is negative. CDX-2, cytokeratin 7 and cytokeratin  20 are negative for carcinoma. Overall, these findings are consistent with extranodal pulmonary involvement by classical Hodgkin lymphoma. The case was sent to [facility] for consultation (report [phone]), who agrees with the  above interpretation. Dr. Juliann Mule was paged on 05/19/2013.  05/26/2013  Bone Marrow, Aspirate,Biopsy, and Clot, right iliac - HYPERCELLULAR BONE MARROW WITH ATYPICAL LYMPHOHISTIOCYTIC PROLIFERATION. - TRILINEAGE HEMATOPOIESIS.- SEE COMMENT.PERIPHERAL BLOOD:- NORMOCYTIC-NORMOCHROMIC ANEMIA. - MONOCYTOSIS.Diagnosis Note The aspirate material is extremely limited for morphologic evaluation. However, the core biopsy shows numerous variably sized and ill-defined interstitial and paratrabecular atypical lymphohistiocytic aggregates mostly composed of small lymphocytes and histiocytes in addition to scattering of larger CD30 positive lymphoid appearing cells. The histologic and phenotypic features are similar to previously diagnosed classical Hodgkin's lymphoma ([id number]) and consistent with marrow involvement by the same disease process. (BNS:ecj/gt 05/27/2013)BASSAM SMIR MD Pathologist, Electronic Signature (Case signed 05/31/2013)  ASSESSMENT: Diamond Boyd 73 y.o. female with a history of Hodgkin Lymphoma + B symptoms including (Fevers, night sweats and weight lost) who is getting treatment with AVD regimen (ABVD minus bleomycin) and getting neulasta for white count support. She also has prior history of MZL and CRC. She starts her day 15 cycle 5 today.  PLAN:  1. Chemotherapy-induced neutropenia. -- Patients WBC have recovered and ANC is 2400 today.   2. H.o of Marginal zone lymphoma now with multiple lung nodules consistent with Hodgkin's lymphoma (Stage IV with bone marrow involvement) with B symptoms.   --Previously, we had an extensive discussion and review of her pulmonary pathology and final report conclusive for classical hodgkin's disease with bone marrow invovlement.  She reported fevers night sweats which would be considered a constitutional B symptoms. Her ESR is 84 (high).  Based on advanced lymphoma, she received a bone marrow biopsy which demonstrated involvement of Hodgkin's lymphoma.   Her echo also was obtained demonstrating a normal EF.  She received a R port-a-cath.   Her PFTs  demonstrated a moderate restriction (likely secondary to presence of the disease).   --We started chemotherapy with AVD x 6 cycles (category 2A) q28 days (doxorubicin, vinblastine, and dacarbazine) (Duggan DB, JCO, 2003).  Based on moderate restriction component of her PFTs, she is not getting bleomycin.     Day 1, Cycle 5B 11/03/2013  Doxorubicin (Adriamycin) 25 mg/m2  Vinblastine 6 mg/m2  Dacarbazine 375 mg/m2   --  She will give neulasta on Day #2 of each cycle due to delayed count recovery. We obtained a restaging PET-CT following Cycle #2 (NCCN Guidelines, Version 2.2014, HODG-9) to assess response and further treatment based on [place] criteria for response. It demonstrated an interval decrease in size and hypermetabolism of the right lower lobe pulmonary nodule seen on the previous study, consistent with interval response to therapy.  --Her LDH and ESR has normalized.  Her creatinine is normal. Her calcium and serum potassium is normal. She was given anti-emetics and in addition we continue acyclovir 500 mg bid prophylaxis.   She has a listed allergy to allopurinol.   --After 6 cycles we will be ordering a PET scan and arranging a consultation to [facility] as well.  3. Colon cancer.   --Diamond Boyd remains clinically stable. Her last colonoscopy was on  10/10/2010 by Dr. Deatra Ina.  We will make Dr. Deatra Ina of aware. CEA is stable at 1.9.     4. Thyroid nodule.    --Her PET/CT was also concerning for thyroid carcinoma. TSH on 01/04/2013 was 0.51.  Biopsy was negative for malignancy.  5. Right eye irritation, resolved -- She is being followed by dermatology and opthamology.  She reports substantial improvement.   6. Skin irritation beneath fold of breast and pannus. --Counseled to continue anti-fungal powder and keep area as dry as possible.   7.Diabetes sec to medications and  chemotherapy. --Recommending adding metformin 500 mg daily to her medications. Blood sugar better today.   8. Follow-up.  --We will plan to see Diamond Boyd again in 2 weeks and do close follow ups with remaining of her chemotherapy. Now is a time she is going to get cumulative toxicities and will need close monitoring until she gets 6 cycles completed. Day 1 C6A on 11/17/2013.  All questions were answered. The patient knows to call the clinic with any problems, questions or concerns. We can certainly see the patient much sooner if necessary.  I spent 25 minutes counseling the patient face to face. The total time spent in the appointment was 35 minutes.    Bernadene Bell, MD Medical Hematologist/Oncologist [place] Pager: [phone] Office No: [phone]

## 2013-11-03 NOTE — Telephone Encounter (Signed)
Per staff message and POF I have scheduled and adjusted appts. Advised scheduler of appts. JMW

## 2013-11-03 NOTE — Patient Instructions (Signed)
[facility] Discharge Instructions for Patients Receiving Chemotherapy  Today you received the following chemotherapy agents: Adriamycin, Vinblastine, DTIC  To help prevent nausea and vomiting after your treatment, we encourage you to take your nausea medication as prescribed.    If you develop nausea and vomiting that is not controlled by your nausea medication, call the clinic.   BELOW ARE SYMPTOMS THAT SHOULD BE REPORTED IMMEDIATELY:  *FEVER GREATER THAN 100.5 F  *CHILLS WITH OR WITHOUT FEVER  NAUSEA AND VOMITING THAT IS NOT CONTROLLED WITH YOUR NAUSEA MEDICATION  *UNUSUAL SHORTNESS OF BREATH  *UNUSUAL BRUISING OR BLEEDING  TENDERNESS IN MOUTH AND THROAT WITH OR WITHOUT PRESENCE OF ULCERS  *URINARY PROBLEMS  *BOWEL PROBLEMS  UNUSUAL RASH Items with * indicate a potential emergency and should be followed up as soon as possible.  Feel free to call the clinic you have any questions or concerns. The clinic phone number is [phone].

## 2013-11-04 ENCOUNTER — Ambulatory Visit (HOSPITAL_BASED_OUTPATIENT_CLINIC_OR_DEPARTMENT_OTHER): Payer: Medicare Other

## 2013-11-04 VITALS — BP 125/54 | HR 83 | Temp 98.9°F

## 2013-11-04 DIAGNOSIS — C8190 Hodgkin lymphoma, unspecified, unspecified site: Secondary | ICD-10-CM

## 2013-11-04 DIAGNOSIS — Z5189 Encounter for other specified aftercare: Secondary | ICD-10-CM

## 2013-11-04 MED ORDER — PEGFILGRASTIM INJECTION 6 MG/0.6ML
6.0000 mg | Freq: Once | SUBCUTANEOUS | Status: AC
  Administered 2013-11-04: 6 mg via SUBCUTANEOUS
  Filled 2013-11-04: qty 0.6

## 2013-11-17 ENCOUNTER — Ambulatory Visit: Payer: Medicare Other

## 2013-11-17 ENCOUNTER — Encounter: Payer: Self-pay | Admitting: Hematology

## 2013-11-17 ENCOUNTER — Other Ambulatory Visit (HOSPITAL_BASED_OUTPATIENT_CLINIC_OR_DEPARTMENT_OTHER): Payer: Medicare Other

## 2013-11-17 ENCOUNTER — Ambulatory Visit (HOSPITAL_BASED_OUTPATIENT_CLINIC_OR_DEPARTMENT_OTHER): Payer: Medicare Other | Admitting: Hematology

## 2013-11-17 ENCOUNTER — Ambulatory Visit (HOSPITAL_BASED_OUTPATIENT_CLINIC_OR_DEPARTMENT_OTHER): Payer: Medicare Other

## 2013-11-17 ENCOUNTER — Telehealth: Payer: Self-pay | Admitting: Hematology

## 2013-11-17 VITALS — BP 134/60 | HR 90 | Temp 98.9°F | Resp 17 | Ht 65.0 in | Wt 208.5 lb

## 2013-11-17 DIAGNOSIS — Z23 Encounter for immunization: Secondary | ICD-10-CM | POA: Diagnosis not present

## 2013-11-17 DIAGNOSIS — E1369 Other specified diabetes mellitus with other specified complication: Secondary | ICD-10-CM

## 2013-11-17 DIAGNOSIS — Z5111 Encounter for antineoplastic chemotherapy: Secondary | ICD-10-CM

## 2013-11-17 DIAGNOSIS — R21 Rash and other nonspecific skin eruption: Secondary | ICD-10-CM

## 2013-11-17 DIAGNOSIS — Z85038 Personal history of other malignant neoplasm of large intestine: Secondary | ICD-10-CM

## 2013-11-17 DIAGNOSIS — C8190 Hodgkin lymphoma, unspecified, unspecified site: Secondary | ICD-10-CM

## 2013-11-17 DIAGNOSIS — R300 Dysuria: Secondary | ICD-10-CM | POA: Diagnosis not present

## 2013-11-17 LAB — URINALYSIS, MICROSCOPIC - CHCC
Bilirubin (Urine): NEGATIVE
Blood: NEGATIVE
Glucose: NEGATIVE mg/dL
Ketones: NEGATIVE mg/dL
Nitrite: NEGATIVE
PH: 6 (ref 4.6–8.0)
Protein: <30 mg/dL
Specific Gravity, Urine: 1.02 (ref 1.003–1.035)
Urobilinogen, UR: 0.2 mg/dL (ref 0.2–1)

## 2013-11-17 LAB — COMPREHENSIVE METABOLIC PANEL (CC13)
ALK PHOS: 230 U/L — AB (ref 40–150)
ALT: 24 U/L (ref 0–55)
AST: 37 U/L — AB (ref 5–34)
Albumin: 3.5 g/dL (ref 3.5–5.0)
Anion Gap: 6 mEq/L (ref 3–11)
BILIRUBIN TOTAL: 0.24 mg/dL (ref 0.20–1.20)
BUN: 7.9 mg/dL (ref 7.0–26.0)
CO2: 28 mEq/L (ref 22–29)
CREATININE: 0.7 mg/dL (ref 0.6–1.1)
Calcium: 9.6 mg/dL (ref 8.4–10.4)
Chloride: 106 mEq/L (ref 98–109)
GLUCOSE: 122 mg/dL (ref 70–140)
Potassium: 3.8 mEq/L (ref 3.5–5.1)
Sodium: 140 mEq/L (ref 136–145)
Total Protein: 5.8 g/dL — ABNORMAL LOW (ref 6.4–8.3)

## 2013-11-17 LAB — CBC WITH DIFFERENTIAL/PLATELET
BASO%: 2.8 % — ABNORMAL HIGH (ref 0.0–2.0)
Basophils Absolute: 0.1 10*3/uL (ref 0.0–0.1)
EOS%: 9.1 % — AB (ref 0.0–7.0)
Eosinophils Absolute: 0.4 10*3/uL (ref 0.0–0.5)
HEMATOCRIT: 32 % — AB (ref 34.8–46.6)
HGB: 10.5 g/dL — ABNORMAL LOW (ref 11.6–15.9)
LYMPH%: 16.6 % (ref 14.0–49.7)
MCH: 30.6 pg (ref 25.1–34.0)
MCHC: 32.8 g/dL (ref 31.5–36.0)
MCV: 93.3 fL (ref 79.5–101.0)
MONO#: 1 10*3/uL — AB (ref 0.1–0.9)
MONO%: 24 % — AB (ref 0.0–14.0)
NEUT#: 2 10*3/uL (ref 1.5–6.5)
NEUT%: 47.5 % (ref 38.4–76.8)
NRBC: 2 % — AB (ref 0–0)
PLATELETS: 488 10*3/uL — AB (ref 145–400)
RBC: 3.43 10*6/uL — AB (ref 3.70–5.45)
RDW: 19.3 % — ABNORMAL HIGH (ref 11.2–14.5)
WBC: 4.3 10*3/uL (ref 3.9–10.3)
lymph#: 0.7 10*3/uL — ABNORMAL LOW (ref 0.9–3.3)

## 2013-11-17 LAB — LACTATE DEHYDROGENASE (CC13): LDH: 287 U/L — ABNORMAL HIGH (ref 125–245)

## 2013-11-17 MED ORDER — PNEUMOCOCCAL VAC POLYVALENT 25 MCG/0.5ML IJ INJ
0.5000 mL | INJECTION | Freq: Once | INTRAMUSCULAR | Status: DC
  Filled 2013-11-17: qty 0.5

## 2013-11-17 MED ORDER — VINBLASTINE SULFATE CHEMO INJECTION 1 MG/ML
6.0000 mg/m2 | Freq: Once | INTRAVENOUS | Status: AC
  Administered 2013-11-17: 13 mg via INTRAVENOUS
  Filled 2013-11-17: qty 13

## 2013-11-17 MED ORDER — DIPHENHYDRAMINE HCL 50 MG/ML IJ SOLN
INTRAMUSCULAR | Status: AC
  Filled 2013-11-17: qty 1

## 2013-11-17 MED ORDER — SODIUM CHLORIDE 0.9 % IJ SOLN
10.0000 mL | INTRAMUSCULAR | Status: DC | PRN
  Administered 2013-11-17: 10 mL
  Filled 2013-11-17: qty 10

## 2013-11-17 MED ORDER — DEXAMETHASONE SODIUM PHOSPHATE 20 MG/5ML IJ SOLN
INTRAMUSCULAR | Status: AC
  Filled 2013-11-17: qty 5

## 2013-11-17 MED ORDER — DOXORUBICIN HCL CHEMO IV INJECTION 2 MG/ML
25.0000 mg/m2 | Freq: Once | INTRAVENOUS | Status: AC
  Administered 2013-11-17: 54 mg via INTRAVENOUS
  Filled 2013-11-17: qty 27

## 2013-11-17 MED ORDER — HEPARIN SOD (PORK) LOCK FLUSH 100 UNIT/ML IV SOLN
500.0000 [IU] | Freq: Once | INTRAVENOUS | Status: AC | PRN
  Administered 2013-11-17: 500 [IU]
  Filled 2013-11-17: qty 5

## 2013-11-17 MED ORDER — ONDANSETRON 16 MG/50ML IVPB (CHCC)
INTRAVENOUS | Status: AC
  Filled 2013-11-17: qty 16

## 2013-11-17 MED ORDER — SODIUM CHLORIDE 0.9 % IV SOLN
Freq: Once | INTRAVENOUS | Status: AC
  Administered 2013-11-17: 13:00:00 via INTRAVENOUS

## 2013-11-17 MED ORDER — SODIUM CHLORIDE 0.9 % IV SOLN
375.0000 mg/m2 | Freq: Once | INTRAVENOUS | Status: AC
  Administered 2013-11-17: 820 mg via INTRAVENOUS
  Filled 2013-11-17: qty 41

## 2013-11-17 MED ORDER — PNEUMOCOCCAL VAC POLYVALENT 25 MCG/0.5ML IJ INJ: 0.5000 mL | INJECTION | INTRAMUSCULAR | Status: DC

## 2013-11-17 MED ORDER — DEXAMETHASONE SODIUM PHOSPHATE 20 MG/5ML IJ SOLN
20.0000 mg | Freq: Once | INTRAMUSCULAR | Status: AC
  Administered 2013-11-17: 20 mg via INTRAVENOUS

## 2013-11-17 MED ORDER — INFLUENZA VAC SPLIT QUAD 0.5 ML IM SUSY
0.5000 mL | PREFILLED_SYRINGE | Freq: Once | INTRAMUSCULAR | Status: AC
  Administered 2013-11-17: 0.5 mL via INTRAMUSCULAR
  Filled 2013-11-17: qty 0.5

## 2013-11-17 MED ORDER — ONDANSETRON 16 MG/50ML IVPB (CHCC)
16.0000 mg | Freq: Once | INTRAVENOUS | Status: AC
  Administered 2013-11-17: 16 mg via INTRAVENOUS

## 2013-11-17 MED ORDER — FAMOTIDINE IN NACL 20-0.9 MG/50ML-% IV SOLN
INTRAVENOUS | Status: AC
  Filled 2013-11-17: qty 50

## 2013-11-17 NOTE — Patient Instructions (Signed)
[facility] Discharge Instructions for Patients Receiving Chemotherapy  Today you received the following chemotherapy agents AVD.  To help prevent nausea and vomiting after your treatment, we encourage you to take your nausea medication as directed.    If you develop nausea and vomiting that is not controlled by your nausea medication, call the clinic.   BELOW ARE SYMPTOMS THAT SHOULD BE REPORTED IMMEDIATELY:  *FEVER GREATER THAN 100.5 F  *CHILLS WITH OR WITHOUT FEVER  NAUSEA AND VOMITING THAT IS NOT CONTROLLED WITH YOUR NAUSEA MEDICATION  *UNUSUAL SHORTNESS OF BREATH  *UNUSUAL BRUISING OR BLEEDING  TENDERNESS IN MOUTH AND THROAT WITH OR WITHOUT PRESENCE OF ULCERS  *URINARY PROBLEMS  *BOWEL PROBLEMS  UNUSUAL RASH Items with * indicate a potential emergency and should be followed up as soon as possible.  Feel free to call the clinic you have any questions or concerns. The clinic phone number is [phone].

## 2013-11-17 NOTE — Telephone Encounter (Signed)
, °

## 2013-11-17 NOTE — Progress Notes (Signed)
[facility] PROGRESS NOTE Date of Visit: 11/03/2013  [name], MD [address] [place] [address]  DIAGNOSIS: Hodgkin Lymphoma on Chemotherapy for follow up.  PRIOR ONCOLOGIC HISTORY:  1. Marginal Zone Lymphoma 2. Colon Cancer  CURRENT THERAPY:  Started AVD (without Bleomycin due to moderate restriction per PFTs) on 06/08/2013.  Cycle 1A was dose-reduced due to elevated liver function. Today is C6,D1.   INTERVAL HISTORY:  Diamond Boyd 73 y.o. 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   She was last seen by me on 10/20/2013.  She is accompanied by her husband Ray.   She tolerated her first cycle of AVD without Bleo on 06/08/2013 and had delayed count recovery requiring her to not receive AVD on 05/13.  She received neupogen on 05/13 and received cycle #1B on 05/20. She has a rash underneath her breasts.  She reports that her bone pain improved with oxycodone 5 mg as needed.  She denies fevers or night sweats while on chemotherapy.     She denies specifically any pain, difficulty eating, trouble with her bowels, blood in the stool.   She has completed 4 cycles and she gets treatment day 1 and 15 and gets neulasta on day 2 and day 16 of each cycle. After 6 cycles, she will get a restaging PET scan and I am also thinking of getting an opinion from [facility] for post-induction therapy. Likely not a transplant candidate but perhaps good candidate for Brentuximab in future at the time of relapse. She has ongoing persistent hyperglycemia likely sec to chemotherapy and some of the blood sugars we recorded (non-fasting) in last 3 months are 241,197,172,138,119,121, 193 today. I will add  metformin 500 mg daily to her medications. She also have some cough/bronchitis and she was given Z-pak script few weeks ago and she feels better. CBC looks fine and I signed chemotherapy orders today.  She does have some foul odor to urine (noted more by husband) and I am checking a urine analysis and culture today. No flank pain, fever or chills.  MEDICAL HISTORY: Past Medical History  Diagnosis Date  . Colon polyps   . Depression   . Hypothyroidism   . Osteoarthritis     hands  . Peripheral vascular disease   . Degenerative disk disease     spine in center in past   . 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 Cancer     skin cancer- basal cell on arm / colon 2011  . Lymphoma   . Colon cancer     08-2009    INTERIM HISTORY: has MALIGNANT NEOPLASM OF TRANSVERSE COLON; LYMPHOMA NEC, MLIG, SPLEEN; HYPERCHOLESTEROLEMIA, PURE; DISORDERS OF PHOSPHORUS METABOLISM; Anxiety state, unspecified; DEPRESSION; PERIPHERAL VASCULAR DISEASE; Other chronic sinusitis; ALLERGIC  RHINITIS; GERD; OVERACTIVE BLADDER; MENOPAUSAL SYNDROME; OSTEOARTHRITIS; LEG EDEMA; THROAT PAIN, CHRONIC; SKIN CANCER, HX OF; COLONIC POLYPS, HX OF; Elevated blood pressure; Obesity; Hypothyroid; Varicose veins; Other asplenic status; History of anemia; Other screening mammogram; Routine gynecological examination; Colon cancer screening; Skin lesion of face; Thoracic back pain; Left knee pain; Encounter for Medicare annual wellness exam; Cystocele; Herpes zoster; Vaginal pain; Other malaise and fatigue; Acute sinusitis; Left temporal headache; Hyperglycemia; Conjunctivitis, acute;  Cold sore; Hodgkin lymphoma; Dysuria; Colon cancer; Fever; Drug induced neutropenia(288.03); Candidal intertrigo; and Atrophic vaginitis on her problem list.    ALLERGIES:  is allergic to achromycin; allopurinol; astelin; cephalexin; codeine; meloxicam; minocycline; nabumetone; nyquil; penicillins; sulfa antibiotics; zolpidem tartrate; buspar; and  ciprofloxacin.  MEDICATIONS: has a current medication list which includes the following prescription(s): calcium carbonate-vitamin d, celecoxib, chlorpheniramine-hydrocodone, cholecalciferol, cvs allergy relief, epipen 2-pak, estradiol, fexofenadine, fluoxetine, fluticasone, furosemide, levothyroxine, lidocaine-prilocaine, metformin, omega-3, omeprazole, ondansetron, oxycodone, prochlorperazine, psyllium, simvastatin, vitamin e, lorazepam, nystatin, nystatin, and tobramycin-dexamethasone, and the following Facility-Administered Medications: sodium chloride, influenza vac split quadrivalent pf, and pneumococcal 23 valent vaccine.  SURGICAL HISTORY:  Past Surgical History  Procedure Laterality Date  . Cholecystectomy    . Splenectomy      lymphoma  . Breast biopsy  1996  . Plantar fascia release    . Ventral hernia repair  1998  . Tendon release      Right thumb   . Colectomy  8/11  . Vaginal hysterectomy    . Bone marrow biopsy  05/27/13   PROBLEM LIST:  1. Adenocarcinoma of the colon with 2 synchronous primaries dating back to August 2011 when the patient was found to have anemia. Both tumors were associated with negative lymph nodes. One tumor was T2 N0. The other tumor was T3 N0. The T2 N0 stage I tumor was in the sigmoid colon. The T3 N0 stage II tumor was in the transverse colon. The smaller tumor measured 1.5 cm. The larger tumor measured 6.5 cm. Surgery was carried out on 09/28/2009. 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  2. 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 The patient had bone marrow involvement and splenomegaly at that time, also a hemolytic anemia. She underwent a splenectomy on November 12, 1994. Through the years, the patient has received Rituxan most recently, dating back to May 2006. The patient received fludarabine in late 2002 and early 2003. She may have developed a pneumonitis from the  fludarabine in December 2002. The patient has had involvement of the peripheral blood with villous lymphocytes. She appears to be in a state of complete clinical remission at this time. Tumor is CD5 and CD20 positive, also CD11c and FMC7 positive.  3. 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  4. History of lupus anticoagulant dating back to the 1990s.  5. History of iron deficiency anemia for which the patient received IV Feraheme in January 2012. Presumably, this occurred as a result of her colon cancer.  6. History of GERD resulting in cough diagnosed around 2010.  7. Dyslipidemia.  8. Hypothyroidism.  9. Irritable bowel syndrome.  10. Goiter seen on PET scan from 09/04/2009.  11. Enlarged left tonsil diagnosed April 2011, currently resolved.  12. History of depression.   REVIEW OF SYSTEMS:   Constitutional: Denies fevers, chills or abnormal weight loss,+hyperglycemia Eyes: Denies blurriness of vision, mild irritation noted in eyes. Ears, nose, mouth, throat, and face: Denies mucositis or sore throat Respiratory: +cough but better cf 2 weeks ago, no dyspnea or wheezes Cardiovascular: Denies palpitation, chest discomfort or lower extremity swelling Gastrointestinal:  Denies nausea, heartburn or change in bowel habits Skin: Denies abnormal skin rashes Lymphatics: Denies new lymphadenopathy or easy bruising Neurological:+numbness in feet not in hands, no tingling or new weaknesses Behavioral/Psych: Mood is stable, no new changes  All other systems were reviewed with the patient and are negative.  PHYSICAL EXAMINATION: ECOG PERFORMANCE STATUS: 1  Blood pressure 134/60, pulse 90, temperature 98.9 F (37.2 C), temperature source Oral, resp. rate 17, height _0  (1.651 m), weight 208 lb 8 oz (94.575 kg), last menstrual period 02/10/1970, SpO2 96.00%.  GENERAL:alert, mildly distressed but comfortable; moderately obese. Non-toxic appearing; alopecia SKIN: skin color, texture, turgor are  normal, no  significant lesions; small reddish bumps on anterior frontal scalp.   EYES: normal, Conjunctiva are pink, sclera clear OROPHARYNX:no exudate, no erythema and lips, buccal mucosa, and tongue normal  NECK: supple, thyroid normal size, non-tender, without nodularity.  Left neck adenopathy resolved LYMPH:  no palpable lymphadenopathy in the cervical, axillary or supraclavicular LUNGS: clear to auscultation and percussion with normal breathing effort HEART: regular rate & rhythm and no murmurs and no lower extremity edema ABDOMEN:abdomen soft, non-tender and normal bowel sounds; multiple surgical scars well healed.   Musculoskeletal:no cyanosis of digits and no clubbing  NEURO: alert & oriented x 3 with fluent speech, no focal motor/sensory deficits  Labs:        IMAGING STUDIES:  1. Digital screening mammogram from 08/05/2010 was negative.  2. Chest x-ray, 2 view, from 12/09/2010 was negative.  3. Digital screening mammogram on 08/20/2011 showed no specific evidence of malignancy  4. Chest x-ray, 2 view, from 11/19/2011 showed atherosclerotic calcifications within the arch of the aorta. There was evidence of a prior cholecystectomy and splenectomy, otherwise chest x-ray was negative.  5. CT scan of abdomen and pelvis with IV contrast obtained on 01/21/2012 showed no clear evidence of colon cancer metastasis. A retrocrural lymph node had increased in size but periportal adenopathy and periaortic adenopathy was stable to decreased. The retrocrural lymph node refer to measures 8 mm on image 12, increased from 5 mm on a prior study.  6. Chest x-ray, 2 view, from 06/01/2012 showed no acute abnormalities. There was borderline enlargement of the cardiac silhouette.  7. Chest x-ray, 2 view, from 03/09/2013, showed new pulmonary nodules in the right lung. Cannot rule out  metastatic disease. Correlation with CT recommended. (reviewed by me) 8. CT scan of abdomen and pelvis with IV contrast  on 03/09/2013 showed small right lower lobe pulmonary nodules measuring up to 8 mm, new, suspicious for metastases. Small right juxtadiaphragmatic, upper abdominal, retroperitoneal, and left pelvic lymph nodes, stable versus mildly increased. Prior splenectomy, cholecystectomy, and right hemicolectomy. PET-CT is suggested for further evaluation.  (reviewed by me) 9. PET/CT on 03/21/2013 revealed intensely hypermetabolic nodule within the left lobe of thyroid gland. Concern for thyroid carcinoma. 2. Two intensely hypermetabolic right lower lobe pulmonary nodules. Differential includes thyroid cancer metastasis or colon cancer metastasis. 3. Intense uptake within the right sacrum is concerning metastasis. Milder abnormal uptake within the T9 vertebral body is indeterminate but concerning for metastasis. 10. PET on 08/17/2013 showed an interval decrease in size and hypermetabolism of the right lower lobe pulmonary nodule seen on the previous study, consistent with interval response to therapy.  PROCEDURES:  Colonoscopy was carried out by Dr. Erskine Emery on 10/10/2010.  PATHOLOGY:  Lung, needle/core biopsy(ies), RLL sup seg nodule pet+ - MALIGNANT LYMPHORETICULAR NEOPLASM, MOST CONSISTENT WITH CLASSICAL HODGKIN LYMPHOMA. - SEE COMMENT. Diagnosis Note The core biopsies reveal a diffuse lymphohistiocytic infiltrate. There are scattered Hodgkin Reed-Sternberg like cells with prominent nuclei. There are rare lacunar like cells. There are no significant eosinophils or plasma cells seen in the background. Immunohistochemistry reveals scattered CD30 positive cells with golgi staining. The cells are also positive for CD15 and  PAX-5. CD45 interpretation is hampered by diffuse background T-cells. CD3 and CD43 highlight abundant small T-cells. Only scattered B-cells are seen with CD79a and CD20. EBV in situ (EBER) is negative. CDX-2, cytokeratin 7 and cytokeratin 20 are negative for carcinoma. Overall, these findings are  consistent with extranodal pulmonary involvement by classical Hodgkin lymphoma. The case was sent to [facility] for consultation (report [phone]), who agrees with the above interpretation. Dr. Juliann Mule was paged on 05/19/2013.  05/26/2013  Bone Marrow, Aspirate,Biopsy, and Clot, right iliac - HYPERCELLULAR BONE MARROW WITH ATYPICAL LYMPHOHISTIOCYTIC PROLIFERATION. - TRILINEAGE HEMATOPOIESIS.- SEE COMMENT.PERIPHERAL BLOOD:- NORMOCYTIC-NORMOCHROMIC ANEMIA. - MONOCYTOSIS.Diagnosis Note The aspirate material is extremely limited for morphologic evaluation. However, the core biopsy shows numerous variably sized and ill-defined interstitial and paratrabecular atypical lymphohistiocytic aggregates mostly composed of small lymphocytes and histiocytes in addition to scattering of larger CD30 positive lymphoid appearing cells. The histologic and phenotypic features are similar to previously diagnosed classical Hodgkin's lymphoma ([id number]) and consistent with marrow involvement by the same disease process. (BNS:ecj/gt 05/27/2013)BASSAM SMIR MD Pathologist, Electronic Signature (Case signed 05/31/2013)  ASSESSMENT: [name] 73 y.o. female with a history of Hodgkin Lymphoma + B symptoms including (Fevers, night sweats and weight lost) who is getting treatment with AVD regimen (ABVD minus bleomycin) and getting neulasta for white count support. She also has prior history of MZL and CRC. She starts her day 1 cycle 6 today.  PLAN:  1. Chemotherapy-induced neutropenia. -- Patients WBC have recovered and ANC is 2400 today. She gets neulasta for white count prophylaxis.  2. H.o of Marginal zone lymphoma now with multiple lung nodules consistent with Hodgkin's lymphoma (Stage IV with bone marrow involvement) with B symptoms.   --Previously, we had an extensive discussion and review of her pulmonary pathology and final report conclusive for classical hodgkin's disease with bone marrow  invovlement.  She reported fevers night sweats which would be considered a constitutional B symptoms. Her ESR is 84 (high).  Based on advanced lymphoma, she received a bone marrow biopsy which demonstrated involvement of Hodgkin's lymphoma.  Her echo also was obtained demonstrating a normal EF.  She received a R port-a-cath.   Her PFTs  demonstrated a moderate restriction (likely secondary to presence of the disease).   --We started chemotherapy with AVD x 6 cycles (category 2A) q28 days (doxorubicin, vinblastine, and dacarbazine) (Duggan DB, J[employer], 2003).  Based on moderate restriction component of her PFTs, she is not getting bleomycin.     Day 1, Cycle 6A 11/17/2013  Doxorubicin (Adriamycin) 25 mg/m2  Vinblastine 6 mg/m2  Dacarbazine 375 mg/m2   --  She will give neulasta on Day #2 of each cycle due to delayed count recovery. We obtained a restaging PET-CT following Cycle #2 (NCCN Guidelines, Version 2.2014, HODG-9) to assess response and further treatment based on [place] criteria for response. It demonstrated an interval decrease in size and hypermetabolism of the right lower lobe pulmonary nodule seen on the previous study, consistent with interval response to therapy.  --Her LDH and ESR has normalized.  Her creatinine is normal. Her calcium and serum potassium is normal. She was given anti-emetics and in addition we continue acyclovir 500 mg bid prophylaxis.   She has a listed allergy to allopurinol.   --After 6 cycles we will be ordering a PET scan and arranging a consultation to [facility] as well.  3. Colon cancer.   --Novice remains clinically stable. Her last colonoscopy was on  10/10/2010 by Dr. Deatra Ina.  We will make Dr. Deatra Ina of aware. CEA is stable at 1.9.     4. Thyroid nodule.    --Her PET/CT was also concerning for thyroid carcinoma. TSH on 01/04/2013 was 0.51.  Biopsy was negative for malignancy.   5. Right eye irritation, resolved -- She is being followed by dermatology and  opthamology.  She reports substantial improvement.   6. Skin irritation beneath fold of breast and pannus. --Counseled to continue anti-fungal powder and keep area as dry as possible.   7.Diabetes sec to medications and chemotherapy. --Recommending adding metformin 500 mg daily to her medications. Blood sugar better today.  8. Dysuria:  --Check urine analysis and culture to r/o infection in this immunocompromised host.  9. Immunizations: --she gets flu shot today and pneumococcal vaccine tomorrow.  10. Follow-up.  --We will plan to see Cherith again in 2 weeks and do close follow ups with remaining of her chemotherapy. Now is a time she is going to get cumulative toxicities and will need close monitoring until she gets 6 cycles completed. Day 1 C6B on 12/01/2013.  All questions were answered. The patient knows to call the clinic with any problems, questions or concerns. We can certainly see the patient much sooner if necessary.  I spent 25 minutes counseling the patient face to face. The total time spent in the appointment was 35 minutes.    Bernadene Bell, MD Medical Hematologist/Oncologist [place] Pager: [phone] Office No: [phone]

## 2013-11-18 ENCOUNTER — Other Ambulatory Visit: Payer: Self-pay | Admitting: Hematology

## 2013-11-18 ENCOUNTER — Ambulatory Visit (HOSPITAL_BASED_OUTPATIENT_CLINIC_OR_DEPARTMENT_OTHER): Payer: Medicare Other

## 2013-11-18 VITALS — BP 115/55 | HR 78 | Temp 98.9°F

## 2013-11-18 DIAGNOSIS — C8190 Hodgkin lymphoma, unspecified, unspecified site: Secondary | ICD-10-CM

## 2013-11-18 DIAGNOSIS — Z5189 Encounter for other specified aftercare: Secondary | ICD-10-CM | POA: Diagnosis not present

## 2013-11-18 DIAGNOSIS — N390 Urinary tract infection, site not specified: Secondary | ICD-10-CM | POA: Insufficient documentation

## 2013-11-18 LAB — URINE CULTURE

## 2013-11-18 MED ORDER — PEGFILGRASTIM INJECTION 6 MG/0.6ML
6.0000 mg | Freq: Once | SUBCUTANEOUS | Status: AC
  Administered 2013-11-18: 6 mg via SUBCUTANEOUS
  Filled 2013-11-18: qty 0.6

## 2013-11-18 MED ORDER — NITROFURANTOIN MONOHYD MACRO 100 MG PO CAPS: 100.0000 mg | ORAL_CAPSULE | Freq: Two times a day (BID) | ORAL | Status: DC

## 2013-11-22 ENCOUNTER — Other Ambulatory Visit: Payer: Self-pay | Admitting: *Deleted

## 2013-11-22 ENCOUNTER — Other Ambulatory Visit: Payer: Self-pay | Admitting: Hematology

## 2013-11-22 DIAGNOSIS — N390 Urinary tract infection, site not specified: Secondary | ICD-10-CM

## 2013-11-22 MED ORDER — NITROFURANTOIN MONOHYD MACRO 100 MG PO CAPS: 100.0000 mg | ORAL_CAPSULE | Freq: Two times a day (BID) | ORAL | Status: DC

## 2013-12-01 ENCOUNTER — Ambulatory Visit: Payer: Medicare Other

## 2013-12-01 ENCOUNTER — Ambulatory Visit (HOSPITAL_BASED_OUTPATIENT_CLINIC_OR_DEPARTMENT_OTHER): Payer: Medicare Other | Admitting: Hematology

## 2013-12-01 ENCOUNTER — Other Ambulatory Visit (HOSPITAL_BASED_OUTPATIENT_CLINIC_OR_DEPARTMENT_OTHER): Payer: Medicare Other

## 2013-12-01 ENCOUNTER — Encounter: Payer: Self-pay | Admitting: Hematology

## 2013-12-01 ENCOUNTER — Other Ambulatory Visit: Payer: Medicare Other

## 2013-12-01 ENCOUNTER — Telehealth: Payer: Self-pay | Admitting: Hematology

## 2013-12-01 VITALS — BP 139/57 | HR 90 | Temp 98.3°F | Resp 18 | Ht 65.0 in | Wt 206.6 lb

## 2013-12-01 DIAGNOSIS — J40 Bronchitis, not specified as acute or chronic: Secondary | ICD-10-CM

## 2013-12-01 DIAGNOSIS — Z85038 Personal history of other malignant neoplasm of large intestine: Secondary | ICD-10-CM

## 2013-12-01 DIAGNOSIS — D702 Other drug-induced agranulocytosis: Secondary | ICD-10-CM

## 2013-12-01 DIAGNOSIS — C8190 Hodgkin lymphoma, unspecified, unspecified site: Secondary | ICD-10-CM

## 2013-12-01 LAB — COMPREHENSIVE METABOLIC PANEL (CC13)
ALBUMIN: 3.4 g/dL — AB (ref 3.5–5.0)
ALT: 25 U/L (ref 0–55)
AST: 31 U/L (ref 5–34)
Alkaline Phosphatase: 205 U/L — ABNORMAL HIGH (ref 40–150)
Anion Gap: 8 mEq/L (ref 3–11)
BUN: 5.6 mg/dL — ABNORMAL LOW (ref 7.0–26.0)
CALCIUM: 9.5 mg/dL (ref 8.4–10.4)
CHLORIDE: 107 meq/L (ref 98–109)
CO2: 26 mEq/L (ref 22–29)
Creatinine: 0.6 mg/dL (ref 0.6–1.1)
Glucose: 137 mg/dl (ref 70–140)
POTASSIUM: 4 meq/L (ref 3.5–5.1)
SODIUM: 141 meq/L (ref 136–145)
TOTAL PROTEIN: 5.6 g/dL — AB (ref 6.4–8.3)
Total Bilirubin: 0.22 mg/dL (ref 0.20–1.20)

## 2013-12-01 LAB — CBC WITH DIFFERENTIAL/PLATELET
BASO%: 3.2 % — ABNORMAL HIGH (ref 0.0–2.0)
Basophils Absolute: 0.2 10*3/uL — ABNORMAL HIGH (ref 0.0–0.1)
EOS ABS: 0.4 10*3/uL (ref 0.0–0.5)
EOS%: 6.8 % (ref 0.0–7.0)
HCT: 32.8 % — ABNORMAL LOW (ref 34.8–46.6)
HGB: 10.6 g/dL — ABNORMAL LOW (ref 11.6–15.9)
LYMPH#: 0.8 10*3/uL — AB (ref 0.9–3.3)
LYMPH%: 13.8 % — ABNORMAL LOW (ref 14.0–49.7)
MCH: 30.9 pg (ref 25.1–34.0)
MCHC: 32.4 g/dL (ref 31.5–36.0)
MCV: 95.3 fL (ref 79.5–101.0)
MONO#: 1.3 10*3/uL — ABNORMAL HIGH (ref 0.1–0.9)
MONO%: 22.7 % — ABNORMAL HIGH (ref 0.0–14.0)
NEUT%: 53.5 % (ref 38.4–76.8)
NEUTROS ABS: 3 10*3/uL (ref 1.5–6.5)
Platelets: 482 10*3/uL — ABNORMAL HIGH (ref 145–400)
RBC: 3.44 10*6/uL — AB (ref 3.70–5.45)
RDW: 19.8 % — AB (ref 11.2–14.5)
WBC: 5.7 10*3/uL (ref 3.9–10.3)

## 2013-12-01 LAB — LACTATE DEHYDROGENASE (CC13): LDH: 317 U/L — ABNORMAL HIGH (ref 125–245)

## 2013-12-01 LAB — TECHNOLOGIST REVIEW

## 2013-12-01 MED ORDER — AZITHROMYCIN 250 MG PO TABS: ORAL_TABLET | ORAL | Status: DC

## 2013-12-01 NOTE — Telephone Encounter (Signed)
gv adn printed appt sched and avs for pt for OCT....sed added tx

## 2013-12-01 NOTE — Progress Notes (Signed)
[facility] PROGRESS NOTE Date of Visit: 12/01/2013  Diamond Pardon, MD [address] [place] [address]  DIAGNOSIS: Hodgkin Lymphoma on Chemotherapy for follow up.  PRIOR ONCOLOGIC HISTORY:  1. Marginal Zone Lymphoma 2. Colon Cancer  CURRENT THERAPY:  Started AVD (without Bleomycin due to moderate restriction per PFTs) on 06/08/2013.  Cycle 1A was dose-reduced due to elevated liver function. Today is C6,D15. I am delaying chemotherapy to next week because of Bronchitis.   INTERVAL HISTORY:  Diamond Boyd 73 y.o. 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 Last visit with me was on 11/17/2013.  She is accompanied by her husband Diamond Boyd.   She tolerated her first cycle of AVD without Bleo on 06/08/2013 and had delayed count recovery requiring her to not receive AVD on 05/13.  She received neupogen on 05/13 and received cycle #1B on 05/20. She has a rash underneath her breasts.  She reports that her bone pain improved with oxycodone 5 mg as needed.  She denies fevers or night sweats while on chemotherapy.     She denies specifically any pain, difficulty eating, trouble with her bowels, blood in the stool.   She has completed 4 cycles and she gets treatment day 1 and 15 and gets neulasta on day 2 and day 16 of each cycle. After 6 cycles, she will get a restaging PET scan and I am also thinking of getting an opinion from [facility] for post-induction therapy. Likely not a transplant candidate but perhaps good candidate for Brentuximab in future at the time of relapse. She has ongoing persistent hyperglycemia likely sec to chemotherapy and some of the blood sugars we recorded (non-fasting) in last 3  months are 241,197,172,138,119,121, 193 today. I will add metformin 500 mg daily to her medications. She also have some cough/bronchitis today and she was given Z-pak script. CBC/bmp looks fine but I am delaying chemotherapy for 1 week. She just finished a course of Macrobid for UTI yesterday. She c/o left ear pressure and pain but otoscopic exam does not show any redness or fluid on both ears today. No chills, but fatigue present, urine color is fine and no odor any more.   MEDICAL HISTORY: Past Medical History  Diagnosis Date  . Colon polyps   . Depression   . Hypothyroidism   . Osteoarthritis     hands  . Peripheral vascular disease   . Degenerative disk disease     spine in center in past   . 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 Cancer     skin cancer- basal cell on arm / colon 2011  . Lymphoma   . Colon cancer     08-2009    INTERIM HISTORY: has MALIGNANT NEOPLASM OF TRANSVERSE COLON; LYMPHOMA NEC, MLIG, SPLEEN; HYPERCHOLESTEROLEMIA, PURE; DISORDERS OF PHOSPHORUS METABOLISM; Anxiety state, unspecified; DEPRESSION; PERIPHERAL VASCULAR DISEASE; Other chronic sinusitis; ALLERGIC  RHINITIS; GERD; OVERACTIVE BLADDER; MENOPAUSAL SYNDROME; OSTEOARTHRITIS; LEG EDEMA; THROAT PAIN, CHRONIC; SKIN CANCER, HX OF; COLONIC POLYPS, HX OF; Elevated blood pressure; Obesity; Hypothyroid; Varicose veins; Other asplenic status; History of anemia; Other screening mammogram; Routine gynecological examination; Colon cancer screening; Skin lesion of face; Thoracic back pain; Left knee pain; Encounter for Medicare  annual wellness exam; Cystocele; Herpes zoster; Vaginal pain; Other malaise and fatigue; Acute sinusitis; Left temporal headache; Hyperglycemia; Conjunctivitis, acute; Cold sore; Hodgkin lymphoma; Dysuria; Colon cancer; Fever; Drug induced neutropenia(288.03); Candidal intertrigo; Atrophic vaginitis; and Urinary tract infection on her problem list.    ALLERGIES:  is allergic to achromycin; allopurinol;  astelin; cephalexin; codeine; meloxicam; minocycline; nabumetone; nyquil; penicillins; sulfa antibiotics; zolpidem tartrate; buspar; and ciprofloxacin.  MEDICATIONS: has a current medication list which includes the following prescription(s): calcium carbonate-vitamin d, celecoxib, chlorpheniramine-hydrocodone, cholecalciferol, cvs allergy relief, epipen 2-pak, estradiol, fexofenadine, fluoxetine, fluticasone, furosemide, levothyroxine, lidocaine-prilocaine, lorazepam, metformin, nystatin, nystatin, omega-3, omeprazole, ondansetron, azithromycin, oxycodone, prochlorperazine, psyllium, simvastatin, tobramycin-dexamethasone, and vitamin e, and the following Facility-Administered Medications: sodium chloride.  SURGICAL HISTORY:  Past Surgical History  Procedure Laterality Date  . Cholecystectomy    . Splenectomy      lymphoma  . Breast biopsy  1996  . Plantar fascia release    . Ventral hernia repair  1998  . Tendon release      Right thumb   . Colectomy  8/11  . Vaginal hysterectomy    . Bone marrow biopsy  05/27/13   PROBLEM LIST:  1. Adenocarcinoma of the colon with 2 synchronous primaries dating back to August 2011 when the patient was found to have anemia. Both tumors were associated with negative lymph nodes. One tumor was T2 N0. The other tumor was T3 N0. The T2 N0 stage I tumor was in the sigmoid colon. The T3 N0 stage II tumor was in the transverse colon. The smaller tumor measured 1.5 cm. The larger tumor measured 6.5 cm. Surgery was carried out on 09/28/2009. 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  2. History of marginal zone lymphoma probably dating back to 86. The patient had bone marrow involvement and splenomegaly at that time, also a hemolytic anemia. She underwent a splenectomy on November 12, 1994. Through the years, the patient has received Rituxan most recently, dating back to May 2006. The patient received  fludarabine in late 2002 and early 2003. She may have developed a pneumonitis from the fludarabine in December 2002. The patient has had involvement of the peripheral blood with villous lymphocytes. She appears to be in a state of complete clinical remission at this time. Tumor is CD5 and CD20 positive, also CD11c and FMC7 positive.  3. 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  4. History of lupus anticoagulant dating back to the 1990s.  5. History of iron deficiency anemia for which the patient received IV Feraheme in January 2012. Presumably, this occurred as a result of her colon cancer.  6. History of GERD resulting in cough diagnosed around 2010.  7. Dyslipidemia.  8. Hypothyroidism.  9. Irritable bowel syndrome.  10. Goiter seen on PET scan from 09/04/2009.  11. Enlarged left tonsil diagnosed April 2011, currently resolved.  12. History of depression.   REVIEW OF SYSTEMS:   Constitutional: Denies fevers, chills or abnormal weight loss,+hyperglycemia Eyes: Denies blurriness of vision, mild irritation noted in eyes. Ears, nose, mouth, throat, and face: complain of mucositis or sore throat and left ear pain Respiratory: +cough but better cf 2 weeks ago, no dyspnea or wheezes Cardiovascular: Denies palpitation, chest discomfort or lower extremity swelling Gastrointestinal:  Denies nausea, heartburn or change in bowel habits Skin: Denies abnormal skin rashes Lymphatics: Denies new lymphadenopathy or easy bruising Neurological:+numbness in feet not in hands, no tingling or new weaknesses Behavioral/Psych:  Mood is stable, no new changes  All other systems were reviewed with the patient and are negative.  PHYSICAL EXAMINATION: ECOG PERFORMANCE STATUS: 1  Blood pressure 139/57, pulse 90, temperature 98.3 F (36.8 C), temperature source Oral, resp. rate 18, height 5' 5"  (1.651 m), weight 206 lb 9.6 oz (93.713 kg), last menstrual period 02/10/1970.  GENERAL:alert, mildly distressed but  comfortable; moderately obese. Non-toxic appearing; alopecia SKIN: skin color, texture, turgor are normal, no  significant lesions;    EYES: normal, Conjunctiva are pink, sclera clear EAR exam normal tympanic membranes OROPHARYNX:no exudate, MILD erythema in oropharynx mucosa, and tongue normal, no thrush, no cervical adenopathy  NECK: supple, thyroid normal size, non-tender, without nodularity.  Left neck adenopathy resolved LYMPH:  no palpable lymphadenopathy in the cervical, axillary or supraclavicular LUNGS: clear to auscultation and percussion with normal breathing effort HEART: regular rate & rhythm and no murmurs and no lower extremity edema ABDOMEN:abdomen soft, non-tender and normal bowel sounds; multiple surgical scars well healed.   Musculoskeletal:no cyanosis of digits and no clubbing  NEURO: alert & oriented x 3 with fluent speech, no focal motor/sensory deficits  LABS:           IMAGING STUDIES:  1. Digital screening mammogram from 08/05/2010 was negative.  2. Chest x-Diamond Boyd, 2 view, from 12/09/2010 was negative.  3. Digital screening mammogram on 08/20/2011 showed no specific evidence of malignancy  4. Chest x-Diamond Boyd, 2 view, from 11/19/2011 showed atherosclerotic calcifications within the arch of the aorta. There was evidence of a prior cholecystectomy and splenectomy, otherwise chest x-Diamond Boyd was negative.  5. CT scan of abdomen and pelvis with IV contrast obtained on 01/21/2012 showed no clear evidence of colon cancer metastasis. A retrocrural lymph node had increased in size but periportal adenopathy and periaortic adenopathy was stable to decreased. The retrocrural lymph node refer to measures 8 mm on image 12, increased from 5 mm on a prior study.  6. Chest x-Diamond Boyd, 2 view, from 06/01/2012 showed no acute abnormalities. There was borderline enlargement of the cardiac silhouette.  7. Chest x-Diamond Boyd, 2 view, from 03/09/2013, showed new pulmonary nodules in the right lung. Cannot  rule out  metastatic disease. Correlation with CT recommended. (reviewed by me) 8. CT scan of abdomen and pelvis with IV contrast on 03/09/2013 showed small right lower lobe pulmonary nodules measuring up to 8 mm, new, suspicious for metastases. Small right juxtadiaphragmatic, upper abdominal, retroperitoneal, and left pelvic lymph nodes, stable versus mildly increased. Prior splenectomy, cholecystectomy, and right hemicolectomy. PET-CT is suggested for further evaluation.  (reviewed by me) 9. PET/CT on 03/21/2013 revealed intensely hypermetabolic nodule within the left lobe of thyroid gland. Concern for thyroid carcinoma. 2. Two intensely hypermetabolic right lower lobe pulmonary nodules. Differential includes thyroid cancer metastasis or colon cancer metastasis. 3. Intense uptake within the right sacrum is concerning metastasis. Milder abnormal uptake within the T9 vertebral body is indeterminate but concerning for metastasis. 10. PET on 08/17/2013 showed an interval decrease in size and hypermetabolism of the right lower lobe pulmonary nodule seen on the previous study, consistent with interval response to therapy.  PROCEDURES:  Colonoscopy was carried out by Dr. Erskine Emery on 10/10/2010.  PATHOLOGY:  Lung, needle/core biopsy(ies), RLL sup seg nodule pet+ - MALIGNANT LYMPHORETICULAR NEOPLASM, MOST CONSISTENT WITH CLASSICAL HODGKIN LYMPHOMA. - SEE COMMENT. Diagnosis Note The core biopsies reveal a diffuse lymphohistiocytic infiltrate. There are scattered Hodgkin Reed-Sternberg like cells with prominent nuclei. There are rare lacunar like cells. There are no significant eosinophils or  plasma cells seen in the background. Immunohistochemistry reveals scattered CD30 positive cells with golgi staining. The cells are also positive for CD15 and PAX-5. CD45 interpretation is hampered by diffuse background T-cells. CD3 and CD43 highlight abundant small T-cells. Only scattered B-cells are seen with CD79a and  CD20. EBV in situ (EBER) is negative. CDX-2, cytokeratin 7 and cytokeratin 20 are negative for carcinoma. Overall, these findings are consistent with extranodal pulmonary involvement by classical Hodgkin lymphoma. The case was sent to [facility] for consultation (report [phone]), who agrees with the above interpretation. Dr. Juliann Mule was paged on 05/19/2013.  05/26/2013  Bone Marrow, Aspirate,Biopsy, and Clot, right iliac - HYPERCELLULAR BONE MARROW WITH ATYPICAL LYMPHOHISTIOCYTIC PROLIFERATION. - TRILINEAGE HEMATOPOIESIS.- SEE COMMENT.PERIPHERAL BLOOD:- NORMOCYTIC-NORMOCHROMIC ANEMIA. - MONOCYTOSIS.Diagnosis Note The aspirate material is extremely limited for morphologic evaluation. However, the core biopsy shows numerous variably sized and ill-defined interstitial and paratrabecular atypical lymphohistiocytic aggregates mostly composed of small lymphocytes and histiocytes in addition to scattering of larger CD30 positive lymphoid appearing cells. The histologic and phenotypic features are similar to previously diagnosed classical Hodgkin's lymphoma ([id number]) and consistent with marrow involvement by the same disease process. (BNS:ecj/gt 05/27/2013)BASSAM SMIR MD Pathologist, Electronic Signature (Case signed 05/31/2013)  ASSESSMENT: Diamond Boyd 73 y.o. female with a history of Hodgkin Lymphoma + B symptoms including (Fevers, night sweats and weight lost) who is getting treatment with AVD regimen (ABVD minus bleomycin) and getting neulasta for white count support. She also has prior history of MZL and CRC. She starts her day 1 cycle 6 today.  PLAN:  1. Chemotherapy-induced neutropenia. -- Patients WBC have recovered and ANC is 3000 today. She gets neulasta for white count prophylaxis.  2. H.o of Marginal zone lymphoma now with multiple lung nodules consistent with Hodgkin's lymphoma (Stage IV with bone marrow involvement) with B symptoms.   --Previously, we had an  extensive discussion and review of her pulmonary pathology and final report conclusive for classical hodgkin's disease with bone marrow invovlement.  She reported fevers night sweats which would be considered a constitutional B symptoms. Her ESR is 84 (high).  Based on advanced lymphoma, she received a bone marrow biopsy which demonstrated involvement of Hodgkin's lymphoma.  Her echo also was obtained demonstrating a normal EF.  She received a R port-a-cath.   Her PFTs  demonstrated a moderate restriction (likely secondary to presence of the disease).   --We started chemotherapy with AVD x 6 cycles (category 2A) q28 days (doxorubicin, vinblastine, and dacarbazine) (Duggan DB, J[employer], 2003).  Based on moderate restriction component of her PFTs, she is not getting bleomycin.   We are holding chemotherapy today and delaying till next week. She just recovered from a UTI and now have bronchitis and she is an immunocompromised host.  Day 1, Cycle 6A 11/17/2013  Doxorubicin (Adriamycin) 25 mg/m2  Vinblastine 6 mg/m2  Dacarbazine 375 mg/m2   --  She will give neulasta on Day #2 of each cycle due to delayed count recovery. We obtained a restaging PET-CT following Cycle #2 (NCCN Guidelines, Version 2.2014, HODG-9) to assess response and further treatment based on [place] criteria for response. It demonstrated an interval decrease in size and hypermetabolism of the right lower lobe pulmonary nodule seen on the previous study, consistent with interval response to therapy.  --Her LDH is trending up now and not sure because of ongoing infections or lymphoma related? We will be doing a PET scan post completion of 6 cycles.  Her creatinine is normal. Her  calcium and serum potassium is normal. She was given anti-emetics and in addition we continue acyclovir 500 mg bid prophylaxis.   She has a listed allergy to allopurinol.   --After 6 cycles we will be ordering a PET scan and arranging a consultation to [facility] as  well.  3. Colon cancer.   --Diamond Boyd remains clinically stable. Her last colonoscopy was on  10/10/2010 by Dr. Deatra Ina.  We will make Dr. Deatra Ina of aware. CEA is stable at 1.9.     4. Thyroid nodule.    --Her PET/CT was also concerning for thyroid carcinoma. TSH on 01/04/2013 was 0.51.  Biopsy was negative for malignancy.   5. Right eye irritation, resolved -- She is being followed by dermatology and opthamology.  She reports substantial improvement.   6. Skin irritation beneath fold of breast and pannus. --Counseled to continue anti-fungal powder and keep area as dry as possible.   7.Diabetes sec to medications and chemotherapy. --Recommending adding metformin 500 mg daily to her medications. Blood sugar better today.  8. UTI seen on UA/Culture on 10/8 --treated with Macrobid due to her multiple other allergies to antibiotics. She feels better.  9. Bronchitis and sinus Drainage symptoms. --Gave her a z-pak 5 day course and delaying her last chemotherapy till next week 12/08/13.  9. Immunizations: --she got her flu shot and pneumonia vaccine on hold for now.  10. Follow-up.  --We will plan to see Diamond Boyd again in 1 weeks and do close follow ups with remaining of her chemotherapy. Now is a time she is going to get cumulative toxicities and will need close monitoring until she gets 6 cycles completed. Day 22 C6B on 12/08/2013.  All questions were answered. The patient knows to call the clinic with any problems, questions or concerns. We can certainly see the patient much sooner if necessary.  I spent 20 minutes counseling the patient face to face. The total time spent in the appointment was 25 minutes.    Bernadene Bell, MD Medical Hematologist/Oncologist [place] Pager: [phone] Office No: [phone]

## 2013-12-02 ENCOUNTER — Ambulatory Visit: Payer: Medicare Other

## 2013-12-07 ENCOUNTER — Other Ambulatory Visit (HOSPITAL_BASED_OUTPATIENT_CLINIC_OR_DEPARTMENT_OTHER): Payer: Medicare Other

## 2013-12-07 ENCOUNTER — Ambulatory Visit (HOSPITAL_BASED_OUTPATIENT_CLINIC_OR_DEPARTMENT_OTHER): Payer: Medicare Other | Admitting: Hematology

## 2013-12-07 ENCOUNTER — Telehealth: Payer: Self-pay | Admitting: Hematology

## 2013-12-07 ENCOUNTER — Encounter: Payer: Self-pay | Admitting: Hematology

## 2013-12-07 VITALS — BP 136/64 | HR 87 | Temp 98.9°F | Resp 18 | Ht 65.0 in | Wt 208.6 lb

## 2013-12-07 DIAGNOSIS — Z23 Encounter for immunization: Secondary | ICD-10-CM | POA: Diagnosis not present

## 2013-12-07 DIAGNOSIS — C8190 Hodgkin lymphoma, unspecified, unspecified site: Secondary | ICD-10-CM | POA: Diagnosis not present

## 2013-12-07 DIAGNOSIS — D702 Other drug-induced agranulocytosis: Secondary | ICD-10-CM

## 2013-12-07 DIAGNOSIS — E119 Type 2 diabetes mellitus without complications: Secondary | ICD-10-CM

## 2013-12-07 LAB — CBC WITH DIFFERENTIAL/PLATELET
BASO%: 3.2 % — AB (ref 0.0–2.0)
Basophils Absolute: 0.2 10*3/uL — ABNORMAL HIGH (ref 0.0–0.1)
EOS%: 4.2 % (ref 0.0–7.0)
Eosinophils Absolute: 0.3 10*3/uL (ref 0.0–0.5)
HCT: 34.5 % — ABNORMAL LOW (ref 34.8–46.6)
HGB: 10.9 g/dL — ABNORMAL LOW (ref 11.6–15.9)
LYMPH%: 12 % — AB (ref 14.0–49.7)
MCH: 30.1 pg (ref 25.1–34.0)
MCHC: 31.7 g/dL (ref 31.5–36.0)
MCV: 95.3 fL (ref 79.5–101.0)
MONO#: 1.7 10*3/uL — ABNORMAL HIGH (ref 0.1–0.9)
MONO%: 24.7 % — AB (ref 0.0–14.0)
NEUT#: 3.8 10*3/uL (ref 1.5–6.5)
NEUT%: 55.9 % (ref 38.4–76.8)
Platelets: 368 10*3/uL (ref 145–400)
RBC: 3.62 10*6/uL — AB (ref 3.70–5.45)
RDW: 19.7 % — ABNORMAL HIGH (ref 11.2–14.5)
WBC: 6.9 10*3/uL (ref 3.9–10.3)
lymph#: 0.8 10*3/uL — ABNORMAL LOW (ref 0.9–3.3)

## 2013-12-07 LAB — COMPREHENSIVE METABOLIC PANEL (CC13)
ALT: 27 U/L (ref 0–55)
AST: 32 U/L (ref 5–34)
Albumin: 3.5 g/dL (ref 3.5–5.0)
Alkaline Phosphatase: 213 U/L — ABNORMAL HIGH (ref 40–150)
Anion Gap: 7 mEq/L (ref 3–11)
BILIRUBIN TOTAL: 0.31 mg/dL (ref 0.20–1.20)
BUN: 9.9 mg/dL (ref 7.0–26.0)
CO2: 29 mEq/L (ref 22–29)
Calcium: 9.8 mg/dL (ref 8.4–10.4)
Chloride: 105 mEq/L (ref 98–109)
Creatinine: 0.6 mg/dL (ref 0.6–1.1)
Glucose: 139 mg/dl (ref 70–140)
Potassium: 3.8 mEq/L (ref 3.5–5.1)
Sodium: 142 mEq/L (ref 136–145)
Total Protein: 5.7 g/dL — ABNORMAL LOW (ref 6.4–8.3)

## 2013-12-07 LAB — LACTATE DEHYDROGENASE (CC13): LDH: 228 U/L (ref 125–245)

## 2013-12-07 MED ORDER — PNEUMOCOCCAL VAC POLYVALENT 25 MCG/0.5ML IJ INJ
0.5000 mL | INJECTION | Freq: Once | INTRAMUSCULAR | Status: AC
  Administered 2013-12-07: 0.5 mL via INTRAMUSCULAR
  Filled 2013-12-07: qty 0.5

## 2013-12-07 NOTE — Telephone Encounter (Signed)
Pt confirmed labs/ov per 10/28 POF, gave pt AVS.... KJ

## 2013-12-07 NOTE — Progress Notes (Signed)
[facility] PROGRESS NOTE Date of Visit: 12/07/2013  [name], MD [address] [place] [address]  DIAGNOSIS: Hodgkin Lymphoma on Chemotherapy for follow up.  PRIOR ONCOLOGIC HISTORY:  1. Marginal Zone Lymphoma 2. Colon Cancer  CURRENT THERAPY:  Started AVD (without Bleomycin due to moderate restriction per PFTs) on 06/08/2013.  Cycle 1A was dose-reduced due to elevated liver function. Today is C6,D21. Patient to get last chemotherapy tomorrow.   INTERVAL HISTORY:  Diamond Boyd 73 y.o. 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 Last visit with me was on 11/17/2013.  She is accompanied by her husband Ray.   She tolerated her first cycle of AVD without Bleo on 06/08/2013 and had delayed count recovery requiring her to not receive AVD on 05/13.  She received neupogen on 05/13 and received cycle #1B on 05/20. She has a rash underneath her breasts.  She reports that her bone pain improved with oxycodone 5 mg as needed.  She denies fevers or night sweats while on chemotherapy.     She denies specifically any pain, difficulty eating, trouble with her bowels, blood in the stool.   She has completed 4 cycles and she gets treatment day 1 and 15 and gets neulasta on day 2 and day 16 of each cycle. After 6 cycles, she will get a restaging PET scan and I am also thinking of getting an opinion from [facility] for post-induction therapy. Likely not a transplant candidate but perhaps good candidate for Brentuximab in future at the time of relapse. She had some cough/bronchitis last week and she was given Z-pak script and her condition is better. She just finished a course of Macrobid for UTI last  week also. She did get a pneumonia vaccine in office today.    MEDICAL HISTORY: Past Medical History  Diagnosis Date  . Colon polyps   . Depression   . Hypothyroidism   . Osteoarthritis     hands  . Peripheral vascular disease   . Degenerative disk disease     spine in center in past   . 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 Cancer     skin cancer- basal cell on arm / colon 2011  . Lymphoma   . Colon cancer     08-2009    INTERIM HISTORY: has MALIGNANT NEOPLASM OF TRANSVERSE COLON; LYMPHOMA NEC, MLIG, SPLEEN; HYPERCHOLESTEROLEMIA, PURE; DISORDERS OF PHOSPHORUS METABOLISM; Anxiety state, unspecified; DEPRESSION; PERIPHERAL VASCULAR DISEASE; Other chronic sinusitis; ALLERGIC  RHINITIS; GERD; OVERACTIVE BLADDER; MENOPAUSAL SYNDROME; OSTEOARTHRITIS; LEG EDEMA; THROAT PAIN, CHRONIC; SKIN CANCER, HX OF; COLONIC POLYPS, HX OF; Elevated blood pressure; Obesity; Hypothyroid; Varicose veins; Other asplenic status; History of anemia; Other screening mammogram; Routine gynecological examination; Colon cancer screening; Skin lesion of face; Thoracic back pain; Left knee pain; Encounter for Medicare annual wellness exam; Cystocele; Herpes zoster; Vaginal pain; Other malaise and fatigue; Acute sinusitis; Left temporal headache; Hyperglycemia; Conjunctivitis, acute; Cold sore; Hodgkin lymphoma; Dysuria; Colon cancer; Fever; Drug induced neutropenia(288.03); Candidal intertrigo; Atrophic vaginitis; and Urinary tract infection on her problem list.    ALLERGIES:  is allergic to achromycin; allopurinol; astelin; cephalexin; codeine; meloxicam; minocycline; nabumetone; nyquil; penicillins; sulfa antibiotics; zolpidem tartrate; buspar; and ciprofloxacin.  MEDICATIONS: has a current medication list which includes the following prescription(s): calcium carbonate-vitamin d, celecoxib, chlorpheniramine-hydrocodone, cholecalciferol, cvs allergy relief, epipen 2-pak, estradiol, fexofenadine, fluoxetine, fluticasone,  furosemide, levothyroxine, lidocaine-prilocaine, lorazepam, metformin, nystatin, nystatin, omega-3, omeprazole, ondansetron, oxycodone, prochlorperazine, psyllium, simvastatin, tobramycin-dexamethasone, and vitamin e, and the following Facility-Administered Medications: sodium chloride.  SURGICAL HISTORY:  Past Surgical History  Procedure Laterality Date  . Cholecystectomy    . Splenectomy      lymphoma  . Breast biopsy  1996  . Plantar fascia release    . Ventral hernia repair  1998  . Tendon release      Right thumb   . Colectomy  8/11  . Vaginal hysterectomy    . Bone marrow biopsy  05/27/13   PROBLEM LIST:  1. Adenocarcinoma of the colon with 2 synchronous primaries dating back to August 2011 when the patient was found to have anemia. Both tumors were associated with negative lymph nodes. One tumor was T2 N0. The other tumor was T3 N0. The T2 N0 stage I tumor was in the sigmoid colon. The T3 N0 stage II tumor was in the transverse colon. The smaller tumor measured 1.5 cm. The larger tumor measured 6.5 cm. Surgery was carried out on 09/28/2009. 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  2. History of marginal zone lymphoma probably dating back to 55. The patient had bone marrow involvement and splenomegaly at that time, also a hemolytic anemia. She underwent a splenectomy on November 12, 1994. Through the years, the patient has received Rituxan most recently, dating back to May 2006. The patient received fludarabine in late 2002 and early 2003. She may have developed a pneumonitis from the fludarabine in December 2002. The patient has had involvement of the peripheral blood with villous lymphocytes. She appears to be in a state of complete clinical remission at this time. Tumor is CD5 and CD20 positive, also CD11c and FMC7 positive.  3. 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  4. History of lupus anticoagulant dating back to the  1990s.  5. History of iron deficiency anemia for which the patient received IV Feraheme in January 2012. Presumably, this occurred as a result of her colon cancer.  6. History of GERD resulting in cough diagnosed around 2010.  7. Dyslipidemia.  8. Hypothyroidism.  9. Irritable bowel syndrome.  10. Goiter seen on PET scan from 09/04/2009.  11. Enlarged left tonsil diagnosed April 2011, currently resolved.  12. History of depression.   REVIEW OF SYSTEMS:   Constitutional: Denies fevers, chills or abnormal weight loss,+hyperglycemia Eyes: Denies blurriness of vision, mild irritation noted in eyes. Ears, nose, mouth, throat, and face: complain of mucositis or sore throat and left ear pain Respiratory: +cough but better cf 2 weeks ago, no dyspnea or wheezes Cardiovascular: Denies palpitation, chest discomfort or lower extremity swelling Gastrointestinal:  Denies nausea, heartburn or change in bowel habits Skin: Denies abnormal skin rashes Lymphatics: Denies new lymphadenopathy or easy bruising Neurological:+numbness in feet not in hands, no tingling or new weaknesses Behavioral/Psych: Mood is stable, no new changes  All other systems were reviewed with the patient and are negative.  PHYSICAL EXAMINATION: ECOG PERFORMANCE STATUS: 1  Blood pressure 136/64, pulse 87, temperature 98.9 F (37.2 C), temperature source Oral, resp. rate 18, height 5' 5"  (1.651 m), weight 208 lb 9.6 oz (94.62 kg), last menstrual period 02/10/1970, SpO2 97.00%.  GENERAL:alert, mildly distressed but comfortable; moderately obese. Non-toxic appearing;  alopecia SKIN: skin color, texture, turgor are normal, no  significant lesions;    EYES: normal, Conjunctiva are pink, sclera clear EAR exam normal tympanic membranes OROPHARYNX:no exudate, MILD erythema in oropharynx mucosa, and tongue normal, no thrush, no cervical adenopathy  NECK: supple, thyroid normal size, non-tender, without nodularity.  Left neck adenopathy  resolved LYMPH:  no palpable lymphadenopathy in the cervical, axillary or supraclavicular LUNGS: clear to auscultation and percussion with normal breathing effort HEART: regular rate & rhythm and no murmurs and no lower extremity edema ABDOMEN:abdomen soft, non-tender and normal bowel sounds; multiple surgical scars well healed.   Musculoskeletal:no cyanosis of digits and no clubbing  NEURO: alert & oriented x 3 with fluent speech, no focal motor/sensory deficits  LABS:          IMAGING STUDIES:  1. Digital screening mammogram from 08/05/2010 was negative.  2. Chest x-ray, 2 view, from 12/09/2010 was negative.  3. Digital screening mammogram on 08/20/2011 showed no specific evidence of malignancy  4. Chest x-ray, 2 view, from 11/19/2011 showed atherosclerotic calcifications within the arch of the aorta. There was evidence of a prior cholecystectomy and splenectomy, otherwise chest x-ray was negative.  5. CT scan of abdomen and pelvis with IV contrast obtained on 01/21/2012 showed no clear evidence of colon cancer metastasis. A retrocrural lymph node had increased in size but periportal adenopathy and periaortic adenopathy was stable to decreased. The retrocrural lymph node refer to measures 8 mm on image 12, increased from 5 mm on a prior study.  6. Chest x-ray, 2 view, from 06/01/2012 showed no acute abnormalities. There was borderline enlargement of the cardiac silhouette.  7. Chest x-ray, 2 view, from 03/09/2013, showed new pulmonary nodules in the right lung. Cannot rule out  metastatic disease. Correlation with CT recommended. (reviewed by me) 8. CT scan of abdomen and pelvis with IV contrast on 03/09/2013 showed small right lower lobe pulmonary nodules measuring up to 8 mm, new, suspicious for metastases. Small right juxtadiaphragmatic, upper abdominal, retroperitoneal, and left pelvic lymph nodes, stable versus mildly increased. Prior splenectomy, cholecystectomy, and right  hemicolectomy. PET-CT is suggested for further evaluation.  (reviewed by me) 9. PET/CT on 03/21/2013 revealed intensely hypermetabolic nodule within the left lobe of thyroid gland. Concern for thyroid carcinoma. 2. Two intensely hypermetabolic right lower lobe pulmonary nodules. Differential includes thyroid cancer metastasis or colon cancer metastasis. 3. Intense uptake within the right sacrum is concerning metastasis. Milder abnormal uptake within the T9 vertebral body is indeterminate but concerning for metastasis. 10. PET on 08/17/2013 showed an interval decrease in size and hypermetabolism of the right lower lobe pulmonary nodule seen on the previous study, consistent with interval response to therapy.  PROCEDURES:  Colonoscopy was carried out by Dr. Erskine Emery on 10/10/2010.  PATHOLOGY:  Lung, needle/core biopsy(ies), RLL sup seg nodule pet+ - MALIGNANT LYMPHORETICULAR NEOPLASM, MOST CONSISTENT WITH CLASSICAL HODGKIN LYMPHOMA. - SEE COMMENT. Diagnosis Note The core biopsies reveal a diffuse lymphohistiocytic infiltrate. There are scattered Hodgkin Reed-Sternberg like cells with prominent nuclei. There are rare lacunar like cells. There are no significant eosinophils or plasma cells seen in the background. Immunohistochemistry reveals scattered CD30 positive cells with golgi staining. The cells are also positive for CD15 and PAX-5. CD45 interpretation is hampered by diffuse background T-cells. CD3 and CD43 highlight abundant small T-cells. Only scattered B-cells are seen with CD79a and CD20. EBV in situ (EBER) is negative. CDX-2, cytokeratin 7 and cytokeratin 20 are negative for carcinoma. Overall, these findings are consistent  with extranodal pulmonary involvement by classical Hodgkin lymphoma. The case was sent to [facility] for consultation (report [phone]), who agrees with the above interpretation. Dr. Juliann Mule was paged on 05/19/2013.  05/26/2013  Bone Marrow, Aspirate,Biopsy,  and Clot, right iliac - HYPERCELLULAR BONE MARROW WITH ATYPICAL LYMPHOHISTIOCYTIC PROLIFERATION. - TRILINEAGE HEMATOPOIESIS.- SEE COMMENT.PERIPHERAL BLOOD:- NORMOCYTIC-NORMOCHROMIC ANEMIA. - MONOCYTOSIS.Diagnosis Note The aspirate material is extremely limited for morphologic evaluation. However, the core biopsy shows numerous variably sized and ill-defined interstitial and paratrabecular atypical lymphohistiocytic aggregates mostly composed of small lymphocytes and histiocytes in addition to scattering of larger CD30 positive lymphoid appearing cells. The histologic and phenotypic features are similar to previously diagnosed classical Hodgkin's lymphoma ([id number]) and consistent with marrow involvement by the same disease process. (BNS:ecj/gt 05/27/2013)BASSAM SMIR MD Pathologist, Electronic Signature (Case signed 05/31/2013)  ASSESSMENT: Diamond Boyd 73 y.o. female with a history of Hodgkin Lymphoma + B symptoms including (Fevers, night sweats and weight lost) who is getting treatment with AVD regimen (ABVD minus bleomycin) and getting neulasta for white count support. She also has prior history of MZL and CRC. She starts her last day of chemo with cycle 6 tomorrow.  PLAN:  1. Chemotherapy-induced neutropenia. -- Patients WBC have recovered and ANC is 3800 today. She gets neulasta for white count prophylaxis.  2. H.o of Marginal zone lymphoma now with multiple lung nodules consistent with Hodgkin's lymphoma (Stage IV with bone marrow involvement) with B symptoms.   --Previously, we had an extensive discussion and review of her pulmonary pathology and final report conclusive for classical hodgkin's disease with bone marrow invovlement.  She reported fevers night sweats which would be considered a constitutional B symptoms. Her ESR is 84 (high).  Based on advanced lymphoma, she received a bone marrow biopsy which demonstrated involvement of Hodgkin's lymphoma.  Her echo also was obtained  demonstrating a normal EF.  She received a R port-a-cath.   Her PFTs  demonstrated a moderate restriction (likely secondary to presence of the disease).   --We started chemotherapy with AVD x 6 cycles (category 2A) q28 days (doxorubicin, vinblastine, and dacarbazine) (Duggan DB, J[employer], 2003).  Based on moderate restriction component of her PFTs, she is not getting bleomycin.   We held chemotherapy last week due to bronchitis symptoms which have resolved. She just recovered from a UTI and now have bronchitis and she is an immunocompromised host. She will get neulasta post last chemotherapy.  Day 1, Cycle 6A 11/17/2013  Doxorubicin (Adriamycin) 25 mg/m2  Vinblastine 6 mg/m2  Dacarbazine 375 mg/m2   --  She will give neulasta on Day #2 of each cycle due to delayed count recovery. We obtained a restaging PET-CT following Cycle #2 (NCCN Guidelines, Version 2.2014, HODG-9) to assess response and further treatment based on [place] criteria for response. It demonstrated an interval decrease in size and hypermetabolism of the right lower lobe pulmonary nodule seen on the previous study, consistent with interval response to therapy.  --Her LDH is trending up now and not sure because of ongoing infections or lymphoma related? We will be doing a PET scan post completion of 6 cycles.  Her creatinine is normal. Her calcium and serum potassium is normal. She was given anti-emetics and in addition we continue acyclovir 500 mg bid prophylaxis.   She has a listed allergy to allopurinol.   --After 6 cycles we will be ordering a PET scan 12/27/2013 and follow up here with labs on 12/29/2013. We will be arranging a consultation to  [facility] as well.  3. Colon cancer.   --Lakena remains clinically stable. Her last colonoscopy was on  10/10/2010 by Dr. Deatra Ina.  We will make Dr. Deatra Ina of aware. CEA is stable at 1.9.     4. Thyroid nodule.    --Her PET/CT was also concerning for thyroid carcinoma. TSH on 01/04/2013 was 0.51.   Biopsy was negative for malignancy.   5. Right eye irritation, resolved -- She is being followed by dermatology and opthamology.  She reports substantial improvement.   6. Skin irritation beneath fold of breast and pannus. --Counseled to continue anti-fungal powder and keep area as dry as possible.   7.Diabetes sec to medications and chemotherapy. --Recommending adding metformin 500 mg daily to her medications. Blood sugar better today.  8. UTI seen on UA/Culture on 10/8 --treated with Macrobid due to her multiple other allergies to antibiotics. She feels better.  9. Bronchitis and sinus Drainage symptoms. --Gave her a z-pak 5 day course and delaying her last chemotherapy last week and her symptoms are much improved.  9. Immunizations: --she got her flu shot before and pneumonia vaccine given today.  10. Follow-up.  --We will plan to see Alonah again in 3 weeks and do close follow ups with remaining of her chemotherapy. She will have a PET scan before visit. Her LDH which was rising has NORMALIZED today and could have been elevated due to infection.   All questions were answered. The patient knows to call the clinic with any problems, questions or concerns. We can certainly see the patient much sooner if necessary.  I spent 15 minutes counseling the patient face to face. The total time spent in the appointment was 205 minutes.    Bernadene Bell, MD Medical Hematologist/Oncologist [place] Pager: [phone] Office No: [phone]

## 2013-12-08 ENCOUNTER — Ambulatory Visit (HOSPITAL_BASED_OUTPATIENT_CLINIC_OR_DEPARTMENT_OTHER): Payer: Medicare Other

## 2013-12-08 ENCOUNTER — Other Ambulatory Visit: Payer: Self-pay

## 2013-12-08 VITALS — BP 154/63 | HR 93 | Temp 98.0°F | Resp 18

## 2013-12-08 DIAGNOSIS — Z5111 Encounter for antineoplastic chemotherapy: Secondary | ICD-10-CM

## 2013-12-08 DIAGNOSIS — C8190 Hodgkin lymphoma, unspecified, unspecified site: Secondary | ICD-10-CM | POA: Diagnosis not present

## 2013-12-08 DIAGNOSIS — Z1231 Encounter for screening mammogram for malignant neoplasm of breast: Secondary | ICD-10-CM

## 2013-12-08 MED ORDER — DOXORUBICIN HCL CHEMO IV INJECTION 2 MG/ML
25.0000 mg/m2 | Freq: Once | INTRAVENOUS | Status: AC
  Administered 2013-12-08: 54 mg via INTRAVENOUS
  Filled 2013-12-08: qty 27

## 2013-12-08 MED ORDER — SODIUM CHLORIDE 0.9 % IV SOLN
375.0000 mg/m2 | Freq: Once | INTRAVENOUS | Status: AC
  Administered 2013-12-08: 820 mg via INTRAVENOUS
  Filled 2013-12-08: qty 41

## 2013-12-08 MED ORDER — DEXAMETHASONE SODIUM PHOSPHATE 20 MG/5ML IJ SOLN
INTRAMUSCULAR | Status: AC
  Filled 2013-12-08: qty 5

## 2013-12-08 MED ORDER — VINBLASTINE SULFATE CHEMO INJECTION 1 MG/ML
6.0000 mg/m2 | Freq: Once | INTRAVENOUS | Status: AC
  Administered 2013-12-08: 13 mg via INTRAVENOUS
  Filled 2013-12-08: qty 13

## 2013-12-08 MED ORDER — HEPARIN SOD (PORK) LOCK FLUSH 100 UNIT/ML IV SOLN
500.0000 [IU] | Freq: Once | INTRAVENOUS | Status: AC | PRN
  Administered 2013-12-08: 500 [IU]
  Filled 2013-12-08: qty 5

## 2013-12-08 MED ORDER — ONDANSETRON 16 MG/50ML IVPB (CHCC)
16.0000 mg | Freq: Once | INTRAVENOUS | Status: AC
  Administered 2013-12-08: 16 mg via INTRAVENOUS

## 2013-12-08 MED ORDER — DEXAMETHASONE SODIUM PHOSPHATE 20 MG/5ML IJ SOLN
20.0000 mg | Freq: Once | INTRAMUSCULAR | Status: AC
Start: 2013-12-08 — End: 2013-12-08
  Administered 2013-12-08: 20 mg via INTRAVENOUS

## 2013-12-08 MED ORDER — SODIUM CHLORIDE 0.9 % IJ SOLN
10.0000 mL | INTRAMUSCULAR | Status: DC | PRN
  Administered 2013-12-08: 10 mL
  Filled 2013-12-08: qty 10

## 2013-12-08 MED ORDER — SODIUM CHLORIDE 0.9 % IV SOLN
Freq: Once | INTRAVENOUS | Status: AC
  Administered 2013-12-08: 10:00:00 via INTRAVENOUS

## 2013-12-08 MED ORDER — ONDANSETRON 16 MG/50ML IVPB (CHCC)
INTRAVENOUS | Status: AC
  Filled 2013-12-08: qty 16

## 2013-12-08 NOTE — Progress Notes (Signed)
Positive blood return noted before, during, and after Adriamycin push per protocol.

## 2013-12-08 NOTE — Patient Instructions (Signed)
[facility] Discharge Instructions for Patients Receiving Chemotherapy  Today you received the following chemotherapy agents: Adriamycin, Vinblastine, and Dacarbazine.  To help prevent nausea and vomiting after your treatment, we encourage you to take your nausea medication as prescribed.   If you develop nausea and vomiting that is not controlled by your nausea medication, call the clinic.   BELOW ARE SYMPTOMS THAT SHOULD BE REPORTED IMMEDIATELY:  *FEVER GREATER THAN 100.5 F  *CHILLS WITH OR WITHOUT FEVER  NAUSEA AND VOMITING THAT IS NOT CONTROLLED WITH YOUR NAUSEA MEDICATION  *UNUSUAL SHORTNESS OF BREATH  *UNUSUAL BRUISING OR BLEEDING  TENDERNESS IN MOUTH AND THROAT WITH OR WITHOUT PRESENCE OF ULCERS  *URINARY PROBLEMS  *BOWEL PROBLEMS  UNUSUAL RASH Items with * indicate a potential emergency and should be followed up as soon as possible.  Feel free to call the clinic you have any questions or concerns. The clinic phone number is [phone].

## 2013-12-09 ENCOUNTER — Ambulatory Visit (HOSPITAL_BASED_OUTPATIENT_CLINIC_OR_DEPARTMENT_OTHER): Payer: Medicare Other

## 2013-12-09 VITALS — BP 141/59 | HR 76 | Temp 98.5°F | Resp 18

## 2013-12-09 DIAGNOSIS — C8199 Hodgkin lymphoma, unspecified, extranodal and solid organ sites: Secondary | ICD-10-CM

## 2013-12-09 DIAGNOSIS — Z5189 Encounter for other specified aftercare: Secondary | ICD-10-CM

## 2013-12-09 DIAGNOSIS — C8190 Hodgkin lymphoma, unspecified, unspecified site: Secondary | ICD-10-CM

## 2013-12-09 MED ORDER — PEGFILGRASTIM INJECTION 6 MG/0.6ML
6.0000 mg | Freq: Once | SUBCUTANEOUS | Status: AC
  Administered 2013-12-09: 6 mg via SUBCUTANEOUS
  Filled 2013-12-09: qty 0.6

## 2013-12-20 ENCOUNTER — Telehealth: Payer: Self-pay

## 2013-12-20 NOTE — Telephone Encounter (Signed)
Yes- I still think she should get a mammogram - if she wants to put if off a while longer because of other tests and appointments scheduled -I understand

## 2013-12-20 NOTE — Telephone Encounter (Signed)
Pt left v/m; pt is scheduled for mammogram 12/23/13; pt has cancer and is scheduled for PET scan 12/27/13; pt wants to know if pt needs mammogram since having PET scan later this month. Pt request cb.

## 2013-12-20 NOTE — Telephone Encounter (Signed)
Left voicemail letting pt know Dr. Marliss Coots comment/recommendations

## 2013-12-23 ENCOUNTER — Encounter (INDEPENDENT_AMBULATORY_CARE_PROVIDER_SITE_OTHER): Payer: Self-pay

## 2013-12-23 ENCOUNTER — Ambulatory Visit
Admission: RE | Admit: 2013-12-23 | Discharge: 2013-12-23 | Disposition: A | Discharge status: Home / Self Care | Payer: Medicare Other | Source: Ambulatory Visit

## 2013-12-23 DIAGNOSIS — Z1231 Encounter for screening mammogram for malignant neoplasm of breast: Secondary | ICD-10-CM

## 2013-12-27 ENCOUNTER — Ambulatory Visit (HOSPITAL_COMMUNITY)
Admission: RE | Admit: 2013-12-27 | Discharge: 2013-12-27 | Disposition: A | Discharge status: Home / Self Care | Payer: Medicare Other | Source: Ambulatory Visit | Attending: Hematology | Admitting: Hematology

## 2013-12-27 DIAGNOSIS — T451X5A Adverse effect of antineoplastic and immunosuppressive drugs, initial encounter: Secondary | ICD-10-CM | POA: Insufficient documentation

## 2013-12-27 DIAGNOSIS — E041 Nontoxic single thyroid nodule: Secondary | ICD-10-CM | POA: Diagnosis not present

## 2013-12-27 DIAGNOSIS — Z9081 Acquired absence of spleen: Secondary | ICD-10-CM | POA: Diagnosis not present

## 2013-12-27 DIAGNOSIS — C8190 Hodgkin lymphoma, unspecified, unspecified site: Secondary | ICD-10-CM

## 2013-12-27 DIAGNOSIS — E099 Drug or chemical induced diabetes mellitus without complications: Secondary | ICD-10-CM | POA: Insufficient documentation

## 2013-12-27 DIAGNOSIS — Z9221 Personal history of antineoplastic chemotherapy: Secondary | ICD-10-CM | POA: Diagnosis not present

## 2013-12-27 LAB — GLUCOSE, CAPILLARY: GLUCOSE-CAPILLARY: 95 mg/dL (ref 70–99)

## 2013-12-27 MED ORDER — FLUDEOXYGLUCOSE F - 18 (FDG) INJECTION
10.3000 | Freq: Once | INTRAVENOUS | Status: AC | PRN
  Administered 2013-12-27: 10.3 via INTRAVENOUS

## 2013-12-28 ENCOUNTER — Encounter: Payer: Self-pay | Admitting: *Deleted

## 2013-12-29 ENCOUNTER — Other Ambulatory Visit (HOSPITAL_BASED_OUTPATIENT_CLINIC_OR_DEPARTMENT_OTHER): Payer: Medicare Other

## 2013-12-29 ENCOUNTER — Ambulatory Visit (HOSPITAL_BASED_OUTPATIENT_CLINIC_OR_DEPARTMENT_OTHER): Payer: Medicare Other | Admitting: Physician Assistant

## 2013-12-29 ENCOUNTER — Telehealth: Payer: Self-pay | Admitting: Physician Assistant

## 2013-12-29 VITALS — BP 151/76 | HR 93 | Temp 98.8°F | Resp 18 | Ht 65.0 in | Wt 204.2 lb

## 2013-12-29 DIAGNOSIS — C8190 Hodgkin lymphoma, unspecified, unspecified site: Secondary | ICD-10-CM | POA: Diagnosis not present

## 2013-12-29 LAB — COMPREHENSIVE METABOLIC PANEL (CC13)
ALBUMIN: 3.7 g/dL (ref 3.5–5.0)
ALT: 24 U/L (ref 0–55)
AST: 35 U/L — ABNORMAL HIGH (ref 5–34)
Alkaline Phosphatase: 231 U/L — ABNORMAL HIGH (ref 40–150)
Anion Gap: 7 mEq/L (ref 3–11)
BUN: 7.6 mg/dL (ref 7.0–26.0)
CALCIUM: 9.8 mg/dL (ref 8.4–10.4)
CHLORIDE: 105 meq/L (ref 98–109)
CO2: 29 meq/L (ref 22–29)
Creatinine: 0.7 mg/dL (ref 0.6–1.1)
Glucose: 98 mg/dl (ref 70–140)
POTASSIUM: 4.2 meq/L (ref 3.5–5.1)
Sodium: 142 mEq/L (ref 136–145)
Total Bilirubin: 0.39 mg/dL (ref 0.20–1.20)
Total Protein: 6.1 g/dL — ABNORMAL LOW (ref 6.4–8.3)

## 2013-12-29 LAB — CBC WITH DIFFERENTIAL/PLATELET
BASO%: 3.7 % — AB (ref 0.0–2.0)
BASOS ABS: 0.2 10*3/uL — AB (ref 0.0–0.1)
EOS%: 5.8 % (ref 0.0–7.0)
Eosinophils Absolute: 0.3 10*3/uL (ref 0.0–0.5)
HCT: 35.9 % (ref 34.8–46.6)
HEMOGLOBIN: 11.5 g/dL — AB (ref 11.6–15.9)
LYMPH%: 14.9 % (ref 14.0–49.7)
MCH: 30 pg (ref 25.1–34.0)
MCHC: 32 g/dL (ref 31.5–36.0)
MCV: 93.7 fL (ref 79.5–101.0)
MONO#: 1.4 10*3/uL — ABNORMAL HIGH (ref 0.1–0.9)
MONO%: 23.4 % — AB (ref 0.0–14.0)
NEUT#: 3.1 10*3/uL (ref 1.5–6.5)
NEUT%: 52.2 % (ref 38.4–76.8)
Platelets: 348 10*3/uL (ref 145–400)
RBC: 3.84 10*6/uL (ref 3.70–5.45)
RDW: 18.4 % — AB (ref 11.2–14.5)
WBC: 5.9 10*3/uL (ref 3.9–10.3)
lymph#: 0.9 10*3/uL (ref 0.9–3.3)

## 2013-12-29 LAB — LACTATE DEHYDROGENASE (CC13): LDH: 212 U/L (ref 125–245)

## 2013-12-29 NOTE — Telephone Encounter (Signed)
Gave avs & cal for Dec thru March

## 2014-01-01 NOTE — Progress Notes (Signed)
[facility] PROGRESS NOTE Date of Visit: 12/07/2013  [name], MD [address] [place] [address]  DIAGNOSIS: Hodgkin Lymphoma on Chemotherapy for follow up.  PRIOR ONCOLOGIC HISTORY:  1. Marginal Zone Lymphoma 2. Colon Cancer  CURRENT THERAPY:  Started AVD (without Bleomycin due to moderate restriction per PFTs) on 06/08/2013.  Cycle 1A was dose-reduced due to elevated liver function. Today is C6,D21. Status post 6 cycles. Last cycle of chemotherapy given 12/08/2013..   INTERVAL HISTORY:  Diamond Boyd 73 y.o. 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 Last visit with me was on 11/17/2013.  She is accompanied by her husband Ray.   She tolerated her first cycle of AVD without Bleo on 06/08/2013 and had delayed count recovery requiring her to not receive AVD on 05/13.  She received neupogen on 05/13 and received cycle #1B on 05/20.  She reports that her bone pain improved with oxycodone 5 mg as needed.  She denies fevers or night sweats while on chemotherapy.     She denies specifically any pain, difficulty eating, trouble with her bowels, blood in the stool.   She has completed 6 cycles and she got treatment day 1 and 15 and gets neulasta on day 2 and day 16 of each cycle. She recently had a restaging PET scan is here to discuss the results. Dr. Lona Kettle was considering sending the patient to [facility] for second opinion regarding post-induction therapy. He felt she was likely not a transplant candidate but perhaps a good candidate for Brentuximab in future at the time of relapse.   MEDICAL HISTORY: Past Medical History  Diagnosis Date  . Colon polyps   . Depression   .  Hypothyroidism   . Osteoarthritis     hands  . Peripheral vascular disease   . Degenerative disk disease     spine in center in past   . 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 Cancer     skin cancer- basal cell on arm / colon 2011  . Lymphoma   . Colon cancer     08-2009    INTERIM HISTORY: has MALIGNANT NEOPLASM OF TRANSVERSE COLON; LYMPHOMA NEC, MLIG, SPLEEN; HYPERCHOLESTEROLEMIA, PURE; DISORDERS OF PHOSPHORUS METABOLISM; Anxiety state, unspecified; DEPRESSION; PERIPHERAL VASCULAR DISEASE; Other chronic sinusitis; ALLERGIC  RHINITIS; GERD; OVERACTIVE BLADDER; MENOPAUSAL SYNDROME; OSTEOARTHRITIS; LEG EDEMA; THROAT PAIN, CHRONIC; SKIN CANCER, HX OF; COLONIC POLYPS, HX OF; Elevated blood pressure; Obesity; Hypothyroid; Varicose veins; Other asplenic status; History of anemia; Other screening mammogram; Routine gynecological examination; Colon cancer screening; Skin lesion of face; Thoracic back pain; Left knee pain; Encounter for Medicare annual wellness exam; Cystocele; Herpes zoster; Vaginal pain; Other malaise and fatigue; Acute sinusitis; Left temporal headache; Hyperglycemia; Conjunctivitis, acute; Cold sore; Hodgkin lymphoma; Dysuria; Colon cancer; Fever; Drug induced neutropenia(288.03); Candidal intertrigo; Atrophic vaginitis; and Urinary tract infection on her problem list.    ALLERGIES:  is allergic to achromycin; allopurinol; astelin; cephalexin; codeine; meloxicam; minocycline; nabumetone; nyquil; penicillins; sulfa antibiotics; zolpidem tartrate; buspar; and ciprofloxacin.  MEDICATIONS: has a current medication list which includes the following prescription(s): calcium carbonate-vitamin d, celecoxib, cholecalciferol, cvs allergy relief, estradiol, fexofenadine, fluoxetine, fluticasone, levothyroxine, metformin, omega-3, omeprazole, oxycodone, psyllium, simvastatin, vitamin e, chlorpheniramine-hydrocodone,  epipen 2-pak, furosemide, lidocaine-prilocaine, lorazepam, nystatin, nystatin,  ondansetron, prochlorperazine, and tobramycin-dexamethasone, and the following Facility-Administered Medications: sodium chloride.  SURGICAL HISTORY:  Past Surgical History  Procedure Laterality Date  . Cholecystectomy    . Splenectomy      lymphoma  . Breast biopsy  1996  . Plantar fascia release    . Ventral hernia repair  1998  . Tendon release      Right thumb   . Colectomy  8/11  . Vaginal hysterectomy    . Bone marrow biopsy  05/27/13   PROBLEM LIST:  1. Adenocarcinoma of the colon with 2 synchronous primaries dating back to August 2011 when the patient was found to have anemia. Both tumors were associated with negative lymph nodes. One tumor was T2 N0. The other tumor was T3 N0. The T2 N0 stage I tumor was in the sigmoid colon. The T3 N0 stage II tumor was in the transverse colon. The smaller tumor measured 1.5 cm. The larger tumor measured 6.5 cm. Surgery was carried out on 09/28/2009. 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  2. History of marginal zone lymphoma probably dating back to 94. The patient had bone marrow involvement and splenomegaly at that time, also a hemolytic anemia. She underwent a splenectomy on November 12, 1994. Through the years, the patient has received Rituxan most recently, dating back to May 2006. The patient received fludarabine in late 2002 and early 2003. She may have developed a pneumonitis from the fludarabine in December 2002. The patient has had involvement of the peripheral blood with villous lymphocytes. She appears to be in a state of complete clinical remission at this time. Tumor is CD5 and CD20 positive, also CD11c and FMC7 positive.  3. 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  4. History of lupus anticoagulant dating back to the 1990s.  5. History of iron deficiency anemia for which the patient received IV Feraheme in January 2012. Presumably, this occurred as a result of her colon  cancer.  6. History of GERD resulting in cough diagnosed around 2010.  7. Dyslipidemia.  8. Hypothyroidism.  9. Irritable bowel syndrome.  10. Goiter seen on PET scan from 09/04/2009.  11. Enlarged left tonsil diagnosed April 2011, currently resolved.  12. History of depression.   REVIEW OF SYSTEMS:   Constitutional: Denies fevers, chills or abnormal weight loss,+hyperglycemia Eyes: Denies blurriness of vision, mild irritation noted in eyes. Ears, nose, mouth, throat, and face: complain of mucositis or sore throat and left ear pain Respiratory: +cough but better cf 2 weeks ago, no dyspnea or wheezes Cardiovascular: Denies palpitation, chest discomfort or lower extremity swelling Gastrointestinal:  Denies nausea, heartburn or change in bowel habits Skin: Denies abnormal skin rashes Lymphatics: Denies new lymphadenopathy or easy bruising Neurological:+numbness in feet not in hands, no tingling or new weaknesses Behavioral/Psych: Mood is stable, no new changes  All other systems were reviewed with the patient and are negative.  PHYSICAL EXAMINATION: ECOG PERFORMANCE STATUS: 1  Blood pressure 151/76, pulse 93, temperature 98.8 F (37.1 C), temperature source Oral, resp. rate 18, height 5' 5"  (1.651 m), weight 204 lb 3.2 oz (92.625 kg), last menstrual period 02/10/1970, SpO2 97 %.  GENERAL:alert, mildly distressed but comfortable; moderately obese. Non-toxic appearing; alopecia SKIN: skin color, texture, turgor are normal, no  significant lesions;    EYES: normal, Conjunctiva are pink, sclera clear EAR exam normal tympanic membranes OROPHARYNX:no exudate, MILD erythema in  oropharynx mucosa, and tongue normal, no thrush, no cervical adenopathy  NECK: supple, thyroid normal size, non-tender, without nodularity.  Left neck adenopathy resolved LYMPH:  no palpable lymphadenopathy in the cervical, axillary or supraclavicular LUNGS: clear to auscultation and percussion with normal breathing  effort HEART: regular rate & rhythm and no murmurs and no lower extremity edema ABDOMEN:abdomen soft, non-tender and normal bowel sounds; multiple surgical scars well healed.   Musculoskeletal:no cyanosis of digits and no clubbing  NEURO: alert & oriented x 3 with fluent speech, no focal motor/sensory deficits  LABS:    Chemistry      Component Value Date/Time   NA 142 12/29/2013 0958   NA 131* 06/07/2013 2300   K 4.2 12/29/2013 0958   K 4.1 06/07/2013 2300   CL 92* 06/07/2013 2300   CL 105 07/14/2012 0939   CO2 29 12/29/2013 0958   CO2 21 06/07/2013 2300   BUN 7.6 12/29/2013 0958   BUN 17 06/07/2013 2300   CREATININE 0.7 12/29/2013 0958   CREATININE 0.74 06/07/2013 2300      Component Value Date/Time   CALCIUM 9.8 12/29/2013 0958   CALCIUM 9.3 06/07/2013 2300   CALCIUM 9.3 08/09/2009 0000   ALKPHOS 231* 12/29/2013 0958   ALKPHOS 529* 06/07/2013 2358   AST 35* 12/29/2013 0958   AST 121* 06/07/2013 2358   ALT 24 12/29/2013 0958   ALT 46* 06/07/2013 2358   BILITOT 0.39 12/29/2013 0958   BILITOT 2.5* 06/07/2013 2358      Lab Results  Component Value Date   WBC 5.9 12/29/2013   HGB 11.5* 12/29/2013   HCT 35.9 12/29/2013   MCV 93.7 12/29/2013   PLT 348 12/29/2013     Lab Results  Component Value Date   WBC 5.9 12/29/2013   HGB 11.5* 12/29/2013   HCT 35.9 12/29/2013   MCV 93.7 12/29/2013   PLT 348 12/29/2013     IMAGING STUDIES:  1. Digital screening mammogram from 08/05/2010 was negative.  2. Chest x-ray, 2 view, from 12/09/2010 was negative.  3. Digital screening mammogram on 08/20/2011 showed no specific evidence of malignancy  4. Chest x-ray, 2 view, from 11/19/2011 showed atherosclerotic calcifications within the arch of the aorta. There was evidence of a prior cholecystectomy and splenectomy, otherwise chest x-ray was negative.  5. CT scan of abdomen and pelvis with IV contrast obtained on 01/21/2012 showed no clear evidence of colon cancer metastasis.  A retrocrural lymph node had increased in size but periportal adenopathy and periaortic adenopathy was stable to decreased. The retrocrural lymph node refer to measures 8 mm on image 12, increased from 5 mm on a prior study.  6. Chest x-ray, 2 view, from 06/01/2012 showed no acute abnormalities. There was borderline enlargement of the cardiac silhouette.  7. Chest x-ray, 2 view, from 03/09/2013, showed new pulmonary nodules in the right lung. Cannot rule out  metastatic disease. Correlation with CT recommended. (reviewed by me) 8. CT scan of abdomen and pelvis with IV contrast on 03/09/2013 showed small right lower lobe pulmonary nodules measuring up to 8 mm, new, suspicious for metastases. Small right juxtadiaphragmatic, upper abdominal, retroperitoneal, and left pelvic lymph nodes, stable versus mildly increased. Prior splenectomy, cholecystectomy, and right hemicolectomy. PET-CT is suggested for further evaluation.  (reviewed by me) 9. PET/CT on 03/21/2013 revealed intensely hypermetabolic nodule within the left lobe of thyroid gland. Concern for thyroid carcinoma. 2. Two intensely hypermetabolic right lower lobe pulmonary nodules. Differential includes thyroid cancer metastasis or colon cancer metastasis.  3. Intense uptake within the right sacrum is concerning metastasis. Milder abnormal uptake within the T9 vertebral body is indeterminate but concerning for metastasis. 10. PET on 08/17/2013 showed an interval decrease in size and hypermetabolism of the right lower lobe pulmonary nodule seen on the previous study, consistent with interval response to therapy. 11. PET on 12/27/2013 revealed resolution of the hypermetabolic pulmonary nodules, resolution of metabolic activity associated  with the right sacral lesion in T9 vertebral body. There was persistent hypermetabolic benign thyroid nodule and no evidence of colon cancer recurrence at the sigmoid anastomosis.  PROCEDURES:  Colonoscopy was carried out by  Dr. Erskine Emery on 10/10/2010.  PATHOLOGY:  Lung, needle/core biopsy(ies), RLL sup seg nodule pet+ - MALIGNANT LYMPHORETICULAR NEOPLASM, MOST CONSISTENT WITH CLASSICAL HODGKIN LYMPHOMA. - SEE COMMENT. Diagnosis Note The core biopsies reveal a diffuse lymphohistiocytic infiltrate. There are scattered Hodgkin Reed-Sternberg like cells with prominent nuclei. There are rare lacunar like cells. There are no significant eosinophils or plasma cells seen in the background. Immunohistochemistry reveals scattered CD30 positive cells with golgi staining. The cells are also positive for CD15 and PAX-5. CD45 interpretation is hampered by diffuse background T-cells. CD3 and CD43 highlight abundant small T-cells. Only scattered B-cells are seen with CD79a and CD20. EBV in situ (EBER) is negative. CDX-2, cytokeratin 7 and cytokeratin 20 are negative for carcinoma. Overall, these findings are consistent with extranodal pulmonary involvement by classical Hodgkin lymphoma. The case was sent to [facility] for consultation (report [phone]), who agrees with the above interpretation. Dr. Juliann Mule was paged on 05/19/2013.  05/26/2013  Bone Marrow, Aspirate,Biopsy, and Clot, right iliac - HYPERCELLULAR BONE MARROW WITH ATYPICAL LYMPHOHISTIOCYTIC PROLIFERATION. - TRILINEAGE HEMATOPOIESIS.- SEE COMMENT.PERIPHERAL BLOOD:- NORMOCYTIC-NORMOCHROMIC ANEMIA. - MONOCYTOSIS.Diagnosis Note The aspirate material is extremely limited for morphologic evaluation. However, the core biopsy shows numerous variably sized and ill-defined interstitial and paratrabecular atypical lymphohistiocytic aggregates mostly composed of small lymphocytes and histiocytes in addition to scattering of larger CD30 positive lymphoid appearing cells. The histologic and phenotypic features are similar to previously diagnosed classical Hodgkin's lymphoma ([id number]) and consistent with marrow involvement by the same disease process. (BNS:ecj/gt  05/27/2013)BASSAM SMIR MD Pathologist, Electronic Signature (Case signed 05/31/2013)  ASSESSMENT: Diamond Boyd 73 y.o. female with a history of Hodgkin Lymphoma + B symptoms including (Fevers, night sweats and weight lost) who is getting treatment with AVD regimen (ABVD minus bleomycin) and getting neulasta for white count support. She also has prior history of MZL and CRC. She is status post 6 cycles of treatment with the last cycle being given 12/08/2013. starts her last day of chemo with cycle 6 tomorrow.  PLAN:  1. Chemotherapy-induced neutropenia. -- Resolved  2. H.o of Marginal zone lymphoma now with multiple lung nodules consistent with Hodgkin's lymphoma (Stage IV with bone marrow involvement) with B symptoms.   --Her pulmonary pathology and final report conclusive for classical hodgkin's disease with bone marrow invovlement.  She reported fevers night sweats which would be considered a constitutional B symptoms. Her ESR is 84 (high).  Based on advanced lymphoma, she received a bone marrow biopsy which demonstrated involvement of Hodgkin's lymphoma.  Her echo also was obtained demonstrating a normal EF.  She received a R port-a-cath.   Her PFTs  demonstrated a moderate restriction (likely secondary to presence of the disease).   --We started chemotherapy with AVD x 6 cycles (category 2A) q28 days (doxorubicin, vinblastine, and dacarbazine) (Duggan DB, [facility], 2003).  Based on moderate restriction component of  her PFTs, she did not get bleomycin.     Patient was reviewed with Dr. Lona Kettle. He recommended patient completes her current supply of metformin with the over the next 2 months and then discontinue. Any further treatment for elevated blood sugars will be undertaken by her primary care physician, Dr. Glori Bickers.  3. Colon cancer.   --Marilynne remains clinically stable. Her last colonoscopy was on  10/10/2010 by Dr. Deatra Ina.  We will make Dr. Deatra Ina of aware. CEA is stable at 1.9.     4.  Thyroid nodule.    --Her PET/CT was also concerning for thyroid carcinoma. TSH on 01/04/2013 was 0.51.  Biopsy was negative for malignancy.   5. Right eye irritation, resolved -- She is being followed by dermatology and opthamology.  She reports substantial improvement.   6.Diabetes sec to medications and chemotherapy. --Recommending adding metformin 500 mg daily to her medications. Blood sugar better today. Continue metformin as prescribed, completing the current available refills and then follow-up with her primary care physician for any further treatment if needed.  7. Follow-up.  --Patient was also discussed with Dr. Alvy Bimler as she will be taking over the patient's care. We'll plan to have her return in 3 months with the repeat CBC differential, cement and LDH. In the interim she'll be scheduled to have her Port-A-Cath flushed every 6 weeks.   All questions were answered. The patient knows to call the clinic with any problems, questions or concerns. We can certainly see the patient much sooner if necessary.      Wynetta Emery, Yuchen Fedor E, PA-C

## 2014-01-01 NOTE — Patient Instructions (Signed)
Your restaging PET scan revealed resolution of your disease Return every 6 weeks to have a Port-A-Cath flushed Follow-up in 3 months with Dr. Alvy Bimler

## 2014-01-13 DIAGNOSIS — H2513 Age-related nuclear cataract, bilateral: Secondary | ICD-10-CM | POA: Diagnosis not present

## 2014-01-16 ENCOUNTER — Telehealth: Payer: Self-pay

## 2014-01-16 NOTE — Telephone Encounter (Signed)
Cindy with [facility] left v/m; pt to have cataract surgery and Jenny Reichmann wants to know what type reaction pt has to Cephalexin and Penicillin; called left message that unknown reaction are listed on both meds. [facility] voiced understanding and will let Jenny Reichmann know.

## 2014-01-19 ENCOUNTER — Telehealth: Payer: Self-pay | Admitting: *Deleted

## 2014-01-19 ENCOUNTER — Ambulatory Visit (HOSPITAL_BASED_OUTPATIENT_CLINIC_OR_DEPARTMENT_OTHER): Payer: Medicare Other

## 2014-01-19 ENCOUNTER — Telehealth: Payer: Self-pay | Admitting: Hematology and Oncology

## 2014-01-19 VITALS — BP 129/65 | HR 82 | Temp 98.4°F

## 2014-01-19 DIAGNOSIS — Z452 Encounter for adjustment and management of vascular access device: Secondary | ICD-10-CM | POA: Diagnosis not present

## 2014-01-19 DIAGNOSIS — C8190 Hodgkin lymphoma, unspecified, unspecified site: Secondary | ICD-10-CM | POA: Diagnosis not present

## 2014-01-19 DIAGNOSIS — Z95828 Presence of other vascular implants and grafts: Secondary | ICD-10-CM

## 2014-01-19 MED ORDER — SODIUM CHLORIDE 0.9 % IJ SOLN
10.0000 mL | INTRAMUSCULAR | Status: DC | PRN
Start: 2014-01-19 — End: 2014-01-19
  Administered 2014-01-19: 10 mL via INTRAVENOUS
  Filled 2014-01-19: qty 10

## 2014-01-19 MED ORDER — HEPARIN SOD (PORK) LOCK FLUSH 100 UNIT/ML IV SOLN
500.0000 [IU] | Freq: Once | INTRAVENOUS | Status: AC
  Administered 2014-01-19: 500 [IU] via INTRAVENOUS
  Filled 2014-01-19: qty 5

## 2014-01-19 NOTE — Telephone Encounter (Signed)
Lft msg for pt confirming labs/flush/ov per 12/10 POF, mailed schedule to pt .... Cherylann Banas

## 2014-01-19 NOTE — Patient Instructions (Signed)

## 2014-01-19 NOTE — Telephone Encounter (Signed)
Received request from Dr. Sandra Cockayne at [facility] for Dr. Alvy Bimler to clear pt for Cataract Surgery on 02/27/14.   Pt has not seen Dr. Alvy Bimler yet and is not scheduled until 03/30/14.  Per Dr. Alvy Bimler move pt's appt up to early January so she can see pt before her cataract surgery and give clearance if she agrees.   Called pt and informed of appt being moved and why,  Expect call from one of our schedulers.  She verbalized understanding.

## 2014-01-21 MED ORDER — HYDROMORPHONE HCL 4 MG/ML IJ SOLN
INTRAMUSCULAR | Status: AC
  Filled 2014-01-21: qty 1

## 2014-01-23 ENCOUNTER — Encounter: Payer: Self-pay | Admitting: Family Medicine

## 2014-01-23 ENCOUNTER — Ambulatory Visit (INDEPENDENT_AMBULATORY_CARE_PROVIDER_SITE_OTHER): Payer: Medicare Other | Admitting: Family Medicine

## 2014-01-23 VITALS — BP 124/72 | HR 90 | Temp 98.9°F | Ht 65.0 in | Wt 205.5 lb

## 2014-01-23 DIAGNOSIS — J0101 Acute recurrent maxillary sinusitis: Secondary | ICD-10-CM

## 2014-01-23 MED ORDER — AZITHROMYCIN 250 MG PO TABS: ORAL_TABLET | ORAL | Status: DC

## 2014-01-23 MED ORDER — FLUOXETINE HCL 20 MG PO CAPS: 40.0000 mg | ORAL_CAPSULE | Freq: Every morning | ORAL | Status: DC

## 2014-01-23 MED ORDER — CELECOXIB 200 MG PO CAPS: 200.0000 mg | ORAL_CAPSULE | Freq: Every day | ORAL | Status: DC | PRN

## 2014-01-23 NOTE — Patient Instructions (Signed)
Take the zithromax for sinus infection  I sent celebrex (generic) to [facility] I printed prozac -call around for prices  Drink lots of fluids and rest   Update if not starting to improve in a week or if worsening

## 2014-01-23 NOTE — Progress Notes (Signed)
Subjective:    Patient ID: [name], female    DOB: 11-04-1940, 73 y.o.   MRN: [id number]  HPI Here for sinus issues  Headaches - L facial into her gums - started R side a bit last night  Some L ear pain  Some runny nose -clear so far  No fever  Nose bleeds easily - ? Rel to her oncol tx  Gets scabs in her nose    Needs cataract surgery - will need to be oked by oncology and myself  Vitals are ok   Patient Active Problem List   Diagnosis Date Noted  . Urinary tract infection 11/18/2013  . Candidal intertrigo 09/23/2013  . Atrophic vaginitis 09/23/2013  . Drug induced neutropenia(288.03) 09/01/2013  . Fever 07/26/2013  . Colon cancer 06/17/2013  . Hodgkin lymphoma 05/20/2013  . Dysuria 05/20/2013  . Conjunctivitis, acute 04/27/2013  . Cold sore 04/27/2013  . Left temporal headache 03/11/2013  . Hyperglycemia 03/11/2013  . Acute sinusitis 02/09/2013  . Other malaise and fatigue 01/04/2013  . Herpes zoster 11/22/2012  . Vaginal pain 11/22/2012  . Cystocele 09/22/2012  . Encounter for Medicare annual wellness exam 09/17/2012  . Left knee pain 09/10/2012  . Thoracic back pain 06/01/2012  . Skin lesion of face 05/17/2012  . Other screening mammogram 07/22/2011  . Routine gynecological examination 07/22/2011  . Colon cancer screening 07/22/2011  . History of anemia 05/06/2011  . 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 Hypothyroid 02/24/2011  . Varicose veins 02/24/2011  . Elevated blood pressure 11/20/2010  . Obesity 11/20/2010  . 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 Other chronic sinusitis 07/03/2009  . THROAT PAIN, CHRONIC 07/03/2009  . GERD 12/20/2008  . Anxiety state, unspecified 08/22/2008  . MENOPAUSAL SYNDROME 07/13/2007  . HYPERCHOLESTEROLEMIA, PURE 07/08/2006  . ALLERGIC  RHINITIS 07/08/2006  . OVERACTIVE BLADDER 07/08/2006  . 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 DEPRESSION  06/25/2006  . PERIPHERAL VASCULAR DISEASE 06/25/2006  . OSTEOARTHRITIS 06/25/2006  . LEG EDEMA 06/25/2006  . SKIN CANCER, HX OF 06/25/2006  . COLONIC POLYPS, HX OF 06/25/2006   Past Medical History  Diagnosis Date  . Colon polyps   . Depression   . Hypothyroidism   . Osteoarthritis     hands  . Peripheral vascular disease   . Degenerative disk disease     spine in center in past   . 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 Cancer     skin cancer- basal cell on arm / colon 2011  . Lymphoma   . Colon cancer     08-2009   Past Surgical History  Procedure Laterality Date  . Cholecystectomy    . Splenectomy      lymphoma  . Breast biopsy  1996  . Plantar fascia release    . Ventral hernia repair  1998  . Tendon release      Right thumb   . Colectomy  8/11  . Vaginal hysterectomy    . Bone marrow biopsy  05/27/13   History  Substance Use Topics  . Smoking status: Never Smoker   . Smokeless tobacco: Never Used  . Alcohol Use: No   Family History  Problem Relation Age of Onset  . Coronary artery disease Mother   . Alcohol abuse Mother   . Cancer Father     brain  . Diabetes Brother   . Fibromyalgia Daughter     chronic pain   .  COPD Daughter   . Colon cancer Neg Hx   . Colon polyps Neg Hx   . Stomach cancer Neg Hx    Allergies  Allergen Reactions  . Achromycin [Tetracycline Hcl]     Pt does not remember reaction  . Allopurinol     REACTION: Unsure of reaction happene years ago  . Astelin [Azelastine Hcl]     Unknown  . Cephalexin     REACTION: unsure of reaction happened yrs ago.  . Codeine Other (See Comments)    REACTION: abd. pain  . Meloxicam Other (See Comments)    REACTION: GI symptoms  . Minocycline Other (See Comments)    Abdominal pain  . Nabumetone     REACTION: reaction not known  . Nyquil [Pseudoeph-Doxylamine-Dm-Apap] Hives  . Penicillins     REACTION: reaction not known  . Sulfa Antibiotics     Gi side eff   . Zolpidem Tartrate      REACTION: feels too drugged  . Buspar [Buspirone Hcl]     Dizziness, and not as effective for anxiety  . Ciprofloxacin Rash   Current Outpatient Prescriptions on File Prior to Visit  Medication Sig Dispense Refill  . Calcium Carbonate-Vitamin D (CALCIUM 600+D) 600-400 MG-UNIT per tablet Take 1 tablet by mouth daily.     . celecoxib (CELEBREX) 200 MG capsule Take 1 capsule (200 mg total) by mouth daily as needed for moderate pain. 90 capsule 3  . chlorpheniramine-HYDROcodone (TUSSIONEX) 10-8 MG/5ML LQCR Take 5 mLs by mouth 2 (two) times daily. 1 Bottle 0  . cholecalciferol (VITAMIN D) 1000 UNITS tablet Take 1,000 Units by mouth daily.     . [employer] ALLERGY RELIEF 180 MG tablet TAKE 1 TABLET BY MOUTH DAILY. 90 tablet 1  . EPIPEN 2-PAK 0.3 MG/0.3ML SOAJ injection as needed.    Marland Kitchen estradiol (ESTRACE) 2 MG tablet Take 0.5 tablets (1 mg total) by mouth daily. Takes 1/2 tablet 45 tablet 3  . fexofenadine (ALLEGRA) 180 MG tablet Take 1 tablet (180 mg total) by mouth daily. 90 tablet 3  . FLUoxetine (PROZAC) 20 MG capsule Take 2 capsules (40 mg total) by mouth every morning. 180 capsule 3  . fluticasone (FLONASE) 50 MCG/ACT nasal spray Place 1 spray into both nostrils 2 (two) times daily. 48 g 3  . furosemide (LASIX) 20 MG tablet Take 40 mg by mouth daily.    Marland Kitchen levothyroxine (SYNTHROID, LEVOTHROID) 112 MCG tablet Take 1 tablet (112 mcg  total) by mouth daily. 90 tablet 3  . lidocaine-prilocaine (EMLA) cream Apply 1 application topically as needed. 30 g 0  . LORazepam (ATIVAN) 0.5 MG tablet Take 1 tablet (0.5 mg total) by mouth every 6 (six) hours as needed (Nausea or vomiting). 30 tablet 0  . metFORMIN (GLUCOPHAGE XR) 500 MG 24 hr tablet Take 1 tablet (500 mg total) by mouth daily with breakfast. 90 tablet 2  . nystatin (MYCOSTATIN) powder Apply topically 4 (four) times daily. 30 g 0  . nystatin (MYCOSTATIN/NYSTOP) 100000 UNIT/GM POWD Apply to affected areas three times daily 60 g 1  . Omega-3 350 MG CAPS  Take 1 capsule by mouth daily.    Marland Kitchen omeprazole (PRILOSEC) 40 MG capsule Take 1 capsule (40 mg total) by mouth 2 (two) times daily. 180 capsule 1  . ondansetron (ZOFRAN) 8 MG tablet Take 1 tablet (8 mg total) by mouth 2 (two) times daily. Start the day after chemo for 3 days. Then take as needed for nausea or vomiting. [place]  tablet 1  . oxyCODONE (OXY IR/ROXICODONE) 5 MG immediate release tablet Take 1 tablet (5 mg total) by mouth every 6 (six) hours as needed for severe pain. 60 tablet 0  . prochlorperazine (COMPAZINE) 10 MG tablet Take 1 tablet (10 mg total) by mouth every 6 (six) hours as needed (Nausea or vomiting). 30 tablet 1  . psyllium (METAMUCIL) 58.6 % packet Take 1-3 packets by mouth daily.     . simvastatin (ZOCOR) 40 MG tablet Take 1 tablet (40 mg total) by mouth daily. 90 tablet 3  . tobramycin-dexamethasone (TOBRADEX) ophthalmic ointment Place 1 application into the right eye 3 (three) times daily as needed.     . vitamin E (VITAMIN E) 400 UNIT capsule Take 400 Units by mouth daily.      Current Facility-Administered Medications on File Prior to Visit  Medication Dose Route Frequency Provider Last Rate Last Dose  . 0.9 %  sodium chloride infusion  500 mL Intravenous Continuous Inda Castle, MD           Review of Systems Review of Systems  Constitutional: Negative for fever, appetite change,  and unexpected weight change.  ENt pos for cong/rhinorrhea and facial/ear pain worse on the R Eyes: Negative for pain and visual disturbance.  Respiratory: Negative for wheeze and shortness of breath.   Cardiovascular: Negative for cp or palpitations    Gastrointestinal: Negative for nausea, diarrhea and constipation.  Genitourinary: Negative for urgency and frequency.  Skin: Negative for pallor or rash   Neurological: Negative for weakness, light-headedness, numbness and headaches.  Hematological: Negative for adenopathy. Does not bruise/bleed easily.  Psychiatric/Behavioral: Negative  for dysphoric mood. The patient is not nervous/anxious.         Objective:   Physical Exam  Constitutional: She appears well-developed and well-nourished. No distress.  HENT:  Head: Normocephalic and atraumatic.  Right Ear: External ear normal.  Left Ear: External ear normal.  Mouth/Throat: Oropharynx is clear and moist. No oropharyngeal exudate.  Nares are injected and congested   Maxillary sinus pain worse on the L  TMs are dull  Throat clear   Eyes: Conjunctivae and EOM are normal. Pupils are equal, round, and reactive to light. Right eye exhibits no discharge. Left eye exhibits no discharge. No scleral icterus.  Neck: Normal range of motion. Neck supple.  Cardiovascular: Normal rate and regular rhythm.   Pulmonary/Chest: Effort normal and breath sounds normal. No respiratory distress. She has no wheezes. She has no rales.  Lymphadenopathy:    She has no cervical adenopathy.  Neurological: She is alert.  Skin: Skin is warm and dry. No rash noted.  Psychiatric: She has a normal mood and affect.          Assessment & Plan:   Problem List Items Addressed This Visit      Respiratory   Acute sinusitis - Primary    Recurrent/ maxillary / L - in cancer pt  Cover with zpak (the only thing she tolerates)  Fluids/rest /sympt care  Update if not starting to improve in a week or if worsening      Relevant Medications      azithromycin (ZITHROMAX) tablet

## 2014-01-23 NOTE — Progress Notes (Signed)
Pre visit review using our clinic review tool, if applicable. No additional management support is needed unless otherwise documented below in the visit note. 

## 2014-01-23 NOTE — Assessment & Plan Note (Signed)
Recurrent/ maxillary / L - in cancer pt  Cover with zpak (the only thing she tolerates)  Fluids/rest /sympt care  Update if not starting to improve in a week or if worsening

## 2014-01-24 ENCOUNTER — Other Ambulatory Visit: Payer: Self-pay | Admitting: Physician Assistant

## 2014-01-24 ENCOUNTER — Telehealth: Payer: Self-pay | Admitting: *Deleted

## 2014-01-24 NOTE — Telephone Encounter (Signed)
VERBAL ORDER AND READ BACK TO [facility]. HAS FINISHED TREATMENT DO NOT REFILL METFORMIN ER. PT. NEEDS TO FOLLOW UP WITH HER PRIMARY CARE PHYSICIAN. AWAITING A RETURN CALL FROM PT.

## 2014-01-26 ENCOUNTER — Other Ambulatory Visit: Payer: Self-pay | Admitting: *Deleted

## 2014-01-26 NOTE — Telephone Encounter (Signed)
Faxed denial to [employer]

## 2014-01-30 ENCOUNTER — Telehealth: Payer: Self-pay | Admitting: *Deleted

## 2014-01-30 NOTE — Telephone Encounter (Signed)
Received call from [facility] at Dr. Sandra Cockayne office, [facility].  She asks if we received form for clearance for Cataract surgery on 02/27/14?   Called back and informed them of appt w/ Dr. Alvy Bimler on 02/20/14.  We will complete form and send back to them after this appt.. She verbalized understanding.

## 2014-02-10 HISTORY — PX: CATARACT EXTRACTION, BILATERAL: SHX1313

## 2014-02-20 ENCOUNTER — Ambulatory Visit (HOSPITAL_BASED_OUTPATIENT_CLINIC_OR_DEPARTMENT_OTHER): Payer: Medicare Other

## 2014-02-20 ENCOUNTER — Ambulatory Visit (HOSPITAL_BASED_OUTPATIENT_CLINIC_OR_DEPARTMENT_OTHER): Payer: Medicare Other | Admitting: Hematology and Oncology

## 2014-02-20 ENCOUNTER — Telehealth: Payer: Self-pay | Admitting: Hematology and Oncology

## 2014-02-20 ENCOUNTER — Other Ambulatory Visit (HOSPITAL_BASED_OUTPATIENT_CLINIC_OR_DEPARTMENT_OTHER): Payer: Medicare Other

## 2014-02-20 VITALS — BP 145/71 | HR 83 | Temp 98.4°F | Resp 20 | Ht 65.0 in | Wt 210.1 lb

## 2014-02-20 DIAGNOSIS — Z85038 Personal history of other malignant neoplasm of large intestine: Secondary | ICD-10-CM

## 2014-02-20 DIAGNOSIS — C8190 Hodgkin lymphoma, unspecified, unspecified site: Secondary | ICD-10-CM

## 2014-02-20 DIAGNOSIS — C189 Malignant neoplasm of colon, unspecified: Secondary | ICD-10-CM

## 2014-02-20 DIAGNOSIS — R748 Abnormal levels of other serum enzymes: Secondary | ICD-10-CM

## 2014-02-20 DIAGNOSIS — Z95828 Presence of other vascular implants and grafts: Secondary | ICD-10-CM

## 2014-02-20 DIAGNOSIS — D638 Anemia in other chronic diseases classified elsewhere: Secondary | ICD-10-CM | POA: Diagnosis not present

## 2014-02-20 LAB — COMPREHENSIVE METABOLIC PANEL (CC13)
ALT: 24 U/L (ref 0–55)
ANION GAP: 7 meq/L (ref 3–11)
AST: 32 U/L (ref 5–34)
Albumin: 3.7 g/dL (ref 3.5–5.0)
Alkaline Phosphatase: 246 U/L — ABNORMAL HIGH (ref 40–150)
BUN: 9.1 mg/dL (ref 7.0–26.0)
CALCIUM: 9 mg/dL (ref 8.4–10.4)
CO2: 28 mEq/L (ref 22–29)
CREATININE: 0.7 mg/dL (ref 0.6–1.1)
Chloride: 105 mEq/L (ref 98–109)
EGFR: 87 mL/min/{1.73_m2} — ABNORMAL LOW (ref >90)
GLUCOSE: 86 mg/dL (ref 70–140)
Potassium: 4.1 mEq/L (ref 3.5–5.1)
Sodium: 140 mEq/L (ref 136–145)
TOTAL PROTEIN: 6 g/dL — AB (ref 6.4–8.3)
Total Bilirubin: 0.41 mg/dL (ref 0.20–1.20)

## 2014-02-20 LAB — CBC WITH DIFFERENTIAL/PLATELET
BASO%: 1.2 % (ref 0.0–2.0)
BASOS ABS: 0.1 10*3/uL (ref 0.0–0.1)
EOS ABS: 0.3 10*3/uL (ref 0.0–0.5)
EOS%: 5.2 % (ref 0.0–7.0)
HEMATOCRIT: 35.7 % (ref 34.8–46.6)
HGB: 11.4 g/dL — ABNORMAL LOW (ref 11.6–15.9)
LYMPH#: 2.5 10*3/uL (ref 0.9–3.3)
LYMPH%: 38.7 % (ref 14.0–49.7)
MCH: 28.9 pg (ref 25.1–34.0)
MCHC: 32 g/dL (ref 31.5–36.0)
MCV: 90.3 fL (ref 79.5–101.0)
MONO#: 1 10*3/uL — ABNORMAL HIGH (ref 0.1–0.9)
MONO%: 16.2 % — ABNORMAL HIGH (ref 0.0–14.0)
NEUT#: 2.5 10*3/uL (ref 1.5–6.5)
NEUT%: 38.7 % (ref 38.4–76.8)
PLATELETS: 241 10*3/uL (ref 145–400)
RBC: 3.96 10*6/uL (ref 3.70–5.45)
RDW: 16.6 % — ABNORMAL HIGH (ref 11.2–14.5)
WBC: 6.5 10*3/uL (ref 3.9–10.3)

## 2014-02-20 LAB — LACTATE DEHYDROGENASE (CC13): LDH: 152 U/L (ref 125–245)

## 2014-02-20 MED ORDER — SODIUM CHLORIDE 0.9 % IJ SOLN
10.0000 mL | INTRAMUSCULAR | Status: DC | PRN
  Administered 2014-02-20: 10 mL via INTRAVENOUS
  Filled 2014-02-20: qty 10

## 2014-02-20 MED ORDER — HEPARIN SOD (PORK) LOCK FLUSH 100 UNIT/ML IV SOLN
500.0000 [IU] | Freq: Once | INTRAVENOUS | Status: AC
  Administered 2014-02-20: 500 [IU] via INTRAVENOUS
  Filled 2014-02-20: qty 5

## 2014-02-20 NOTE — Patient Instructions (Signed)

## 2014-02-20 NOTE — Telephone Encounter (Signed)
Gave avs & cal for Jan-May.

## 2014-02-21 ENCOUNTER — Encounter: Payer: Self-pay | Admitting: Hematology and Oncology

## 2014-02-21 ENCOUNTER — Ambulatory Visit: Payer: Self-pay | Admitting: Ophthalmology

## 2014-02-21 DIAGNOSIS — H2513 Age-related nuclear cataract, bilateral: Secondary | ICD-10-CM | POA: Diagnosis not present

## 2014-02-21 DIAGNOSIS — I10 Essential (primary) hypertension: Secondary | ICD-10-CM | POA: Diagnosis not present

## 2014-02-21 DIAGNOSIS — Z01810 Encounter for preprocedural cardiovascular examination: Secondary | ICD-10-CM | POA: Diagnosis not present

## 2014-02-21 DIAGNOSIS — D638 Anemia in other chronic diseases classified elsewhere: Secondary | ICD-10-CM | POA: Insufficient documentation

## 2014-02-21 DIAGNOSIS — H2511 Age-related nuclear cataract, right eye: Secondary | ICD-10-CM | POA: Diagnosis not present

## 2014-02-21 DIAGNOSIS — R748 Abnormal levels of other serum enzymes: Secondary | ICD-10-CM | POA: Insufficient documentation

## 2014-02-21 NOTE — Assessment & Plan Note (Signed)
This is likely anemia of chronic disease. The patient denies recent history of bleeding such as epistaxis, hematuria or hematochezia. She is asymptomatic from the anemia. We will observe for now.  

## 2014-02-21 NOTE — Assessment & Plan Note (Signed)
Clinically, she has no signs of recurrence. She is due for colonoscopy and she will schedule with her gastroenterologist.

## 2014-02-21 NOTE — Assessment & Plan Note (Signed)
Her recent abdominal ultrasound last year show possible fatty liver disease/liver cirrhosis. The patient is morbidly obese. She will continue risk factor management.

## 2014-02-21 NOTE — Assessment & Plan Note (Signed)
Clinically, she has no signs of disease. I recommend history and physical examination along with blood work in 4 months and repeat imaging study around October. If her October scan is negative, we will get her port removed. In the meantime, she will come in on a routine basis to get her port flushed
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
## 2014-02-27 ENCOUNTER — Ambulatory Visit: Payer: Self-pay | Admitting: Ophthalmology

## 2014-02-27 DIAGNOSIS — E119 Type 2 diabetes mellitus without complications: Secondary | ICD-10-CM | POA: Diagnosis not present

## 2014-02-27 DIAGNOSIS — Z881 Allergy status to other antibiotic agents status: Secondary | ICD-10-CM | POA: Diagnosis not present

## 2014-02-27 DIAGNOSIS — C3490 Malignant neoplasm of unspecified part of unspecified bronchus or lung: Secondary | ICD-10-CM | POA: Diagnosis not present

## 2014-02-27 DIAGNOSIS — Z888 Allergy status to other drugs, medicaments and biological substances status: Secondary | ICD-10-CM | POA: Diagnosis not present

## 2014-02-27 DIAGNOSIS — H2511 Age-related nuclear cataract, right eye: Secondary | ICD-10-CM | POA: Diagnosis not present

## 2014-02-27 DIAGNOSIS — H2513 Age-related nuclear cataract, bilateral: Secondary | ICD-10-CM | POA: Diagnosis not present

## 2014-02-27 DIAGNOSIS — C8590 Non-Hodgkin lymphoma, unspecified, unspecified site: Secondary | ICD-10-CM | POA: Diagnosis not present

## 2014-02-27 DIAGNOSIS — H269 Unspecified cataract: Secondary | ICD-10-CM | POA: Diagnosis not present

## 2014-02-27 DIAGNOSIS — R011 Cardiac murmur, unspecified: Secondary | ICD-10-CM | POA: Diagnosis not present

## 2014-02-27 DIAGNOSIS — Z882 Allergy status to sulfonamides status: Secondary | ICD-10-CM | POA: Diagnosis not present

## 2014-02-27 DIAGNOSIS — D649 Anemia, unspecified: Secondary | ICD-10-CM | POA: Diagnosis not present

## 2014-02-27 DIAGNOSIS — Z880 Allergy status to penicillin: Secondary | ICD-10-CM | POA: Diagnosis not present

## 2014-02-27 DIAGNOSIS — K219 Gastro-esophageal reflux disease without esophagitis: Secondary | ICD-10-CM | POA: Diagnosis not present

## 2014-02-27 DIAGNOSIS — I10 Essential (primary) hypertension: Secondary | ICD-10-CM | POA: Diagnosis not present

## 2014-03-23 ENCOUNTER — Ambulatory Visit: Payer: Self-pay | Admitting: Ophthalmology

## 2014-03-23 ENCOUNTER — Telehealth: Payer: Self-pay | Admitting: Hematology and Oncology

## 2014-03-23 DIAGNOSIS — I10 Essential (primary) hypertension: Secondary | ICD-10-CM | POA: Diagnosis not present

## 2014-03-23 DIAGNOSIS — Z85118 Personal history of other malignant neoplasm of bronchus and lung: Secondary | ICD-10-CM | POA: Diagnosis not present

## 2014-03-23 DIAGNOSIS — H269 Unspecified cataract: Secondary | ICD-10-CM | POA: Diagnosis not present

## 2014-03-23 DIAGNOSIS — Z01812 Encounter for preprocedural laboratory examination: Secondary | ICD-10-CM | POA: Diagnosis not present

## 2014-03-23 DIAGNOSIS — H2512 Age-related nuclear cataract, left eye: Secondary | ICD-10-CM | POA: Diagnosis not present

## 2014-03-23 NOTE — Telephone Encounter (Signed)
pt called to r/s appt..done...pt aware of new d.t °

## 2014-03-24 ENCOUNTER — Ambulatory Visit (HOSPITAL_BASED_OUTPATIENT_CLINIC_OR_DEPARTMENT_OTHER): Payer: Medicare Other

## 2014-03-24 DIAGNOSIS — C8190 Hodgkin lymphoma, unspecified, unspecified site: Secondary | ICD-10-CM

## 2014-03-24 DIAGNOSIS — Z452 Encounter for adjustment and management of vascular access device: Secondary | ICD-10-CM | POA: Diagnosis not present

## 2014-03-24 DIAGNOSIS — Z95828 Presence of other vascular implants and grafts: Secondary | ICD-10-CM

## 2014-03-24 MED ORDER — HEPARIN SOD (PORK) LOCK FLUSH 100 UNIT/ML IV SOLN
500.0000 [IU] | Freq: Once | INTRAVENOUS | Status: AC
  Administered 2014-03-24: 500 [IU] via INTRAVENOUS
  Filled 2014-03-24: qty 5

## 2014-03-24 MED ORDER — SODIUM CHLORIDE 0.9 % IJ SOLN
10.0000 mL | INTRAMUSCULAR | Status: DC | PRN
  Administered 2014-03-24: 10 mL via INTRAVENOUS
  Filled 2014-03-24: qty 10

## 2014-03-24 NOTE — Patient Instructions (Signed)

## 2014-03-27 ENCOUNTER — Other Ambulatory Visit: Payer: Medicare Other

## 2014-03-27 ENCOUNTER — Ambulatory Visit: Payer: Self-pay | Admitting: Ophthalmology

## 2014-03-27 DIAGNOSIS — H269 Unspecified cataract: Secondary | ICD-10-CM | POA: Diagnosis not present

## 2014-03-27 DIAGNOSIS — Z888 Allergy status to other drugs, medicaments and biological substances status: Secondary | ICD-10-CM | POA: Diagnosis not present

## 2014-03-27 DIAGNOSIS — R600 Localized edema: Secondary | ICD-10-CM | POA: Diagnosis not present

## 2014-03-27 DIAGNOSIS — H2512 Age-related nuclear cataract, left eye: Secondary | ICD-10-CM | POA: Diagnosis not present

## 2014-03-27 DIAGNOSIS — I10 Essential (primary) hypertension: Secondary | ICD-10-CM | POA: Diagnosis not present

## 2014-03-27 DIAGNOSIS — E119 Type 2 diabetes mellitus without complications: Secondary | ICD-10-CM | POA: Diagnosis not present

## 2014-03-27 DIAGNOSIS — Z885 Allergy status to narcotic agent status: Secondary | ICD-10-CM | POA: Diagnosis not present

## 2014-03-27 DIAGNOSIS — M1990 Unspecified osteoarthritis, unspecified site: Secondary | ICD-10-CM | POA: Diagnosis not present

## 2014-03-27 DIAGNOSIS — D649 Anemia, unspecified: Secondary | ICD-10-CM | POA: Diagnosis not present

## 2014-03-27 DIAGNOSIS — Z881 Allergy status to other antibiotic agents status: Secondary | ICD-10-CM | POA: Diagnosis not present

## 2014-03-27 DIAGNOSIS — R011 Cardiac murmur, unspecified: Secondary | ICD-10-CM | POA: Diagnosis not present

## 2014-03-27 DIAGNOSIS — Z882 Allergy status to sulfonamides status: Secondary | ICD-10-CM | POA: Diagnosis not present

## 2014-03-30 ENCOUNTER — Other Ambulatory Visit: Payer: Medicare Other

## 2014-03-30 ENCOUNTER — Ambulatory Visit: Payer: Medicare Other | Admitting: Hematology and Oncology

## 2014-04-03 ENCOUNTER — Encounter: Payer: Self-pay | Admitting: Family Medicine

## 2014-04-03 ENCOUNTER — Ambulatory Visit (INDEPENDENT_AMBULATORY_CARE_PROVIDER_SITE_OTHER): Payer: Medicare Other | Admitting: Family Medicine

## 2014-04-03 VITALS — BP 122/70 | HR 85 | Temp 98.3°F | Ht 65.0 in | Wt 210.1 lb

## 2014-04-03 DIAGNOSIS — G622 Polyneuropathy due to other toxic agents: Secondary | ICD-10-CM

## 2014-04-03 DIAGNOSIS — J0101 Acute recurrent maxillary sinusitis: Secondary | ICD-10-CM

## 2014-04-03 DIAGNOSIS — T451X5A Adverse effect of antineoplastic and immunosuppressive drugs, initial encounter: Secondary | ICD-10-CM

## 2014-04-03 DIAGNOSIS — M7061 Trochanteric bursitis, right hip: Secondary | ICD-10-CM | POA: Diagnosis not present

## 2014-04-03 DIAGNOSIS — G620 Drug-induced polyneuropathy: Secondary | ICD-10-CM | POA: Insufficient documentation

## 2014-04-03 MED ORDER — AZITHROMYCIN 250 MG PO TABS: ORAL_TABLET | ORAL | Status: DC

## 2014-04-03 NOTE — Assessment & Plan Note (Signed)
Symptomatic in feet  Wearing good shoes and using caution  Will wait to see if it improves and then ask for neuro consult later if needed

## 2014-04-03 NOTE — Assessment & Plan Note (Signed)
In immunocomp pt with hx of chronic sinusitis-now with uri and congestion and facial pain Cover with zpak (she tolerates few meds) Disc symptomatic care - see instructions on AVS -will get back to using nasal saline  Update if not starting to improve in a week or if worsening  -esp if worse prod cough or fever

## 2014-04-03 NOTE — Assessment & Plan Note (Signed)
Began after twisting her leg in the car-and now improves Needs xray if no further imp in coming week or two  Adv intermittent cold/heat  Also stretching Will update

## 2014-04-03 NOTE — Patient Instructions (Signed)
Drink lots of fluids and rest  Take the zpak as directed  Hydrocodone cough syrup you already have is ok to use - try a 1/2 dose since it is so sedating  Update if not starting to improve in a week or if worsening     I think you have hip bursitis- use intermittent heat and ice / let us know if no further improvement in the next 2 weeks   For neuropathy - if you want to see neurology in the future let me know

## 2014-04-03 NOTE — Progress Notes (Signed)
Subjective:    Patient ID: [name], female    DOB: Nov 10, 1940, 74 y.o.   MRN: [id number]  HPI Here with uri symptoms  Started last Thursday with post nasal drip sore throat  Caught cold from her husband   Sinuses are painful (she gets frequent sinus infx)- worse on the R Phlegm is yellow  Prod cough also   No fever with this    Just had cataract surgery (face is a little bruised   Also her feet are numb and tingly- from chemo Her R leg is painful and tingly also    Patient Active Problem List   Diagnosis Date Noted  . Peripheral neuropathy due to chemotherapy 04/03/2014  . Trochanteric bursitis of right hip 04/03/2014  . Anemia in chronic illness 02/21/2014  . Elevated liver enzymes 02/21/2014  . Urinary tract infection 11/18/2013  . Candidal intertrigo 09/23/2013  . Atrophic vaginitis 09/23/2013  . Drug induced neutropenia(288.03) 09/01/2013  . Colon cancer 06/17/2013  . Hodgkin lymphoma 05/20/2013  . Dysuria 05/20/2013  . Conjunctivitis, acute 04/27/2013  . Cold sore 04/27/2013  . Left temporal headache 03/11/2013  . Hyperglycemia 03/11/2013  . Acute sinusitis 02/09/2013  . Other malaise and fatigue 01/04/2013  . Herpes zoster 11/22/2012  . Vaginal pain 11/22/2012  . Cystocele 09/22/2012  . Encounter for Medicare annual wellness exam 09/17/2012  . Left knee pain 09/10/2012  . Thoracic back pain 06/01/2012  . Skin lesion of face 05/17/2012  . Other screening mammogram 07/22/2011  . Routine gynecological examination 07/22/2011  . Colon cancer screening 07/22/2011  . History of anemia 05/06/2011  . 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 Hypothyroid 02/24/2011  . Varicose veins 02/24/2011  . Elevated blood pressure 11/20/2010  . Obesity 11/20/2010  . 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 Other chronic sinusitis 07/03/2009  . THROAT PAIN, CHRONIC 07/03/2009  . GERD 12/20/2008  . Anxiety  state, unspecified 08/22/2008  . MENOPAUSAL SYNDROME 07/13/2007  . HYPERCHOLESTEROLEMIA, PURE 07/08/2006  . ALLERGIC  RHINITIS 07/08/2006  . OVERACTIVE BLADDER 07/08/2006  . 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 DEPRESSION 06/25/2006  . PERIPHERAL VASCULAR DISEASE 06/25/2006  . OSTEOARTHRITIS 06/25/2006  . LEG EDEMA 06/25/2006  . SKIN CANCER, HX OF 06/25/2006  . COLONIC POLYPS, HX OF 06/25/2006   Past Medical History  Diagnosis Date  . Colon polyps   . Depression   . Hypothyroidism   . Osteoarthritis     hands  . Peripheral vascular disease   . Degenerative disk disease     spine in center in past   . 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 Cancer     skin cancer- basal cell on arm / colon 2011  . Lymphoma   . Colon cancer     08-2009   Past Surgical History  Procedure Laterality Date  . Cholecystectomy    . Splenectomy      lymphoma  . Breast biopsy  1996  . Plantar fascia release    . Ventral hernia repair  1998  . Tendon release      Right thumb   . Colectomy  8/11  . Vaginal hysterectomy    . Bone marrow biopsy  05/27/13   History  Substance Use Topics  . Smoking status: Never Smoker   . Smokeless tobacco: Never Used  . Alcohol Use: No   Family History  Problem Relation Age of Onset  . Coronary artery disease Mother   .  Alcohol abuse Mother   . Cancer Father     brain  . Diabetes Brother   . Fibromyalgia Daughter     chronic pain   . COPD Daughter   . Colon cancer Neg Hx   . Colon polyps Neg Hx   . Stomach cancer Neg Hx    Allergies  Allergen Reactions  . Achromycin [Tetracycline Hcl]     Pt does not remember reaction  . Allopurinol     REACTION: Unsure of reaction happene years ago  . Astelin [Azelastine Hcl]     Unknown  . Cephalexin     REACTION: unsure of reaction happened yrs ago.  . Codeine Other (See Comments)    REACTION: abd. pain  . Meloxicam Other (See Comments)    REACTION: GI symptoms  . Minocycline Other (See Comments)     Abdominal pain  . Nabumetone     REACTION: reaction not known  . Nyquil [Pseudoeph-Doxylamine-Dm-Apap] Hives  . Penicillins     REACTION: reaction not known  . Sulfa Antibiotics     Gi side eff   . Zolpidem Tartrate     REACTION: feels too drugged  . Buspar [Buspirone Hcl]     Dizziness, and not as effective for anxiety  . Ciprofloxacin Rash   Current Outpatient Prescriptions on File Prior to Visit  Medication Sig Dispense Refill  . Calcium Carbonate-Vitamin D (CALCIUM 600+D) 600-400 MG-UNIT per tablet Take 1 tablet by mouth daily.     . celecoxib (CELEBREX) 200 MG capsule Take 1 capsule (200 mg total) by mouth daily as needed for moderate pain (with food). 90 capsule 3  . chlorpheniramine-HYDROcodone (TUSSIONEX) 10-8 MG/5ML LQCR Take 5 mLs by mouth 2 (two) times daily. 1 Bottle 0  . cholecalciferol (VITAMIN D) 1000 UNITS tablet Take 1,000 Units by mouth daily.     . [facility] ALLERGY RELIEF 180 MG tablet TAKE 1 TABLET BY MOUTH DAILY. 90 tablet 1  . EPIPEN 2-PAK 0.3 MG/0.3ML SOAJ injection as needed.    Marland Kitchen estradiol (ESTRACE) 2 MG tablet Take 0.5 tablets (1 mg total) by mouth daily. Takes 1/2 tablet 45 tablet 3  . fexofenadine (ALLEGRA) 180 MG tablet Take 1 tablet (180 mg total) by mouth daily. 90 tablet 3  . FLUoxetine (PROZAC) 20 MG capsule Take 2 capsules (40 mg total) by mouth every morning. 180 capsule 3  . fluticasone (FLONASE) 50 MCG/ACT nasal spray Place 1 spray into both nostrils 2 (two) times daily. 48 g 3  . furosemide (LASIX) 20 MG tablet Take 40 mg by mouth daily.    Marland Kitchen levothyroxine (SYNTHROID, LEVOTHROID) 112 MCG tablet Take 1 tablet (112 mcg  total) by mouth daily. 90 tablet 3  . lidocaine-prilocaine (EMLA) cream Apply 1 application topically as needed. 30 g 0  . LORazepam (ATIVAN) 0.5 MG tablet Take 1 tablet (0.5 mg total) by mouth every 6 (six) hours as needed (Nausea or vomiting). 30 tablet 0  . metFORMIN (GLUCOPHAGE XR) 500 MG 24 hr tablet Take 1 tablet (500 mg total) by  mouth daily with breakfast. 90 tablet 2  . nystatin (MYCOSTATIN) powder Apply topically 4 (four) times daily. 30 g 0  . Omega-3 350 MG CAPS Take 1 capsule by mouth daily.    Marland Kitchen omeprazole (PRILOSEC) 40 MG capsule Take 1 capsule (40 mg total) by mouth 2 (two) times daily. 180 capsule 1  . ondansetron (ZOFRAN) 8 MG tablet Take 1 tablet (8 mg total) by mouth 2 (two) times daily. Start  the day after chemo for 3 days. Then take as needed for nausea or vomiting. 30 tablet 1  . oxyCODONE (OXY IR/ROXICODONE) 5 MG immediate release tablet Take 1 tablet (5 mg total) by mouth every 6 (six) hours as needed for severe pain. 60 tablet 0  . prochlorperazine (COMPAZINE) 10 MG tablet Take 1 tablet (10 mg total) by mouth every 6 (six) hours as needed (Nausea or vomiting). 30 tablet 1  . psyllium (METAMUCIL) 58.6 % packet Take 1-3 packets by mouth daily.     . simvastatin (ZOCOR) 40 MG tablet Take 1 tablet (40 mg total) by mouth daily. 90 tablet 3  . vitamin E (VITAMIN E) 400 UNIT capsule Take 400 Units by mouth daily.      Current Facility-Administered Medications on File Prior to Visit  Medication Dose Route Frequency Provider Last Rate Last Dose  . 0.9 %  sodium chloride infusion  500 mL Intravenous Continuous Inda Castle, MD        Review of Systems Review of Systems  Constitutional: Negative for fever, appetite change,  and unexpected weight change.  ENT pos for cong and rhinorrhea and sinus pain  Eyes: Negative for pain and visual disturbance.  Respiratory: Negative for wheeze  and shortness of breath.   Cardiovascular: Negative for cp or palpitations    Gastrointestinal: Negative for nausea, diarrhea and constipation.  Genitourinary: Negative for urgency and frequency.  Skin: Negative for pallor or rash   MSK pos for R leg pain  Neurological: Negative for weakness, light-headedness,  and headaches.  Hematological: Negative for adenopathy. Does not bruise/bleed easily.  Psychiatric/Behavioral:  Negative for dysphoric mood. The patient is not nervous/anxious.         Objective:   Physical Exam  Constitutional: She appears well-developed and well-nourished. No distress.  obese and well appearing   HENT:  Head: Normocephalic and atraumatic.  Right Ear: External ear normal.  Left Ear: External ear normal.  Mouth/Throat: Oropharynx is clear and moist. No oropharyngeal exudate.  Nares are injected and congested  Bilateral maxillary sinus tenderness Throat clear   Eyes: Conjunctivae and EOM are normal. Pupils are equal, round, and reactive to light. Right eye exhibits no discharge. Left eye exhibits no discharge.  Neck: Normal range of motion. Neck supple.  Cardiovascular: Normal rate and regular rhythm.   Pulmonary/Chest: Effort normal and breath sounds normal. No respiratory distress. She has no wheezes. She has no rales. She exhibits no tenderness.  Few scattered rhonchi Good air exch  Musculoskeletal: She exhibits tenderness. She exhibits no edema.  Tender over R greater trochanter  Nl rom hip with some pain on int rotation    Lymphadenopathy:    She has no cervical adenopathy.  Neurological: She is alert. She has normal reflexes. No cranial nerve deficit. She exhibits normal muscle tone. Coordination normal.  Skin: Skin is warm and dry. No rash noted. No erythema. No pallor.  Psychiatric: She has a normal mood and affect.          Assessment & Plan:   Problem List Items Addressed This Visit      Respiratory   Acute sinusitis    In immunocomp pt with hx of chronic sinusitis-now with uri and congestion and facial pain Cover with zpak (she tolerates few meds) Disc symptomatic care - see instructions on AVS -will get back to using nasal saline  Update if not starting to improve in a week or if worsening  -esp if worse prod cough or  fever       Relevant Medications   azithromycin (ZITHROMAX) tablet     Nervous and Auditory   Peripheral neuropathy due to  chemotherapy - Primary    Symptomatic in feet  Wearing good shoes and using caution  Will wait to see if it improves and then ask for neuro consult later if needed         Musculoskeletal and Integument   Trochanteric bursitis of right hip    Began after twisting her leg in the car-and now improves Needs xray if no further imp in coming week or two  Adv intermittent cold/heat  Also stretching Will update

## 2014-04-03 NOTE — Progress Notes (Signed)
Pre visit review using our clinic review tool, if applicable. No additional management support is needed unless otherwise documented below in the visit note. 

## 2014-04-17 ENCOUNTER — Other Ambulatory Visit: Payer: Self-pay

## 2014-04-17 NOTE — Telephone Encounter (Signed)
Pt left v/m requesting refill of zpak;pt seen 04/03/14 with sinusitis; pt hurts under eyes on cheek bones; non productive cough;no fever no wheezing or SOB; after finishing zpak pt felt better but symptoms did not completely clear. [facility] [address]

## 2014-04-17 NOTE — Telephone Encounter (Signed)
Please refill zpack times one  She is a cancer patient and intolerant of most antibiotics

## 2014-04-18 MED ORDER — AZITHROMYCIN 250 MG PO TABS: ORAL_TABLET | ORAL | Status: DC

## 2014-05-01 ENCOUNTER — Other Ambulatory Visit: Payer: Medicare Other

## 2014-05-01 ENCOUNTER — Ambulatory Visit (HOSPITAL_BASED_OUTPATIENT_CLINIC_OR_DEPARTMENT_OTHER): Payer: Medicare Other

## 2014-05-01 VITALS — BP 131/56 | HR 75 | Temp 98.0°F | Resp 18

## 2014-05-01 DIAGNOSIS — Z452 Encounter for adjustment and management of vascular access device: Secondary | ICD-10-CM | POA: Diagnosis not present

## 2014-05-01 DIAGNOSIS — C8190 Hodgkin lymphoma, unspecified, unspecified site: Secondary | ICD-10-CM | POA: Diagnosis not present

## 2014-05-01 DIAGNOSIS — Z95828 Presence of other vascular implants and grafts: Secondary | ICD-10-CM

## 2014-05-01 MED ORDER — SODIUM CHLORIDE 0.9 % IJ SOLN
10.0000 mL | INTRAMUSCULAR | Status: DC | PRN
  Administered 2014-05-01: 10 mL via INTRAVENOUS
  Filled 2014-05-01: qty 10

## 2014-05-01 MED ORDER — HEPARIN SOD (PORK) LOCK FLUSH 100 UNIT/ML IV SOLN
500.0000 [IU] | Freq: Once | INTRAVENOUS | Status: AC
  Administered 2014-05-01: 500 [IU] via INTRAVENOUS
  Filled 2014-05-01: qty 5

## 2014-05-01 NOTE — Patient Instructions (Signed)

## 2014-05-11 DIAGNOSIS — J301 Allergic rhinitis due to pollen: Secondary | ICD-10-CM | POA: Diagnosis not present

## 2014-05-11 DIAGNOSIS — J3089 Other allergic rhinitis: Secondary | ICD-10-CM | POA: Diagnosis not present

## 2014-05-15 DIAGNOSIS — J301 Allergic rhinitis due to pollen: Secondary | ICD-10-CM | POA: Diagnosis not present

## 2014-05-15 DIAGNOSIS — J3089 Other allergic rhinitis: Secondary | ICD-10-CM | POA: Diagnosis not present

## 2014-06-11 NOTE — Op Note (Signed)
PATIENT NAME:  Diamond Boyd, Diamond Boyd MR#:  [id number] DATE OF BIRTH:  17-Oct-1940  DATE OF PROCEDURE:  03/27/2014  PREOPERATIVE DIAGNOSIS:  Nuclear sclerotic cataract, left eye.    POSTOPERATIVE DIAGNOSIS:  Nuclear sclerotic cataract, left eye.  PROCEDURE PERFORMED:  Extracapsular cataract extraction using phacoemulsification with placement of an Alcon [id number], 13.5 -diopter posterior chamber lens, serial # [id number].  SURGEON:  Loura Back. Jalayia Bagheri, MD  ASSISTANT:  None.  ANESTHESIA:  4% lidocaine and 0.75% Marcaine in a 50/50 mixture with 10 units per mL of Hylenex added, given as a peribulbar.  ANESTHESIOLOGIST:  Dr. Marcello Moores  COMPLICATIONS:  None.  ESTIMATED BLOOD LOSS:  Less than 1 ml.  DESCRIPTION OF PROCEDURE:  The patient was brought to the operating room and given a peribulbar block.  The patient was then prepped and draped in the usual fashion.  The vertical rectus muscles were imbricated using 5-0 silk sutures.  These sutures were then clamped to the sterile drapes as bridle sutures.  A limbal peritomy was performed extending two clock hours and hemostasis was obtained with cautery.  A partial thickness scleral groove was made at the surgical limbus and dissected anteriorly in a lamellar dissection using an Alcon crescent knife.  The anterior chamber was entered supero-temporally with a Superblade and through the lamellar dissection with a 2.6 mm keratome.  DisCoVisc was used to replace the aqueous and a continuous tear capsulorrhexis was carried out.  Hydrodissection and hydrodelineation were carried out with balanced salt and a 27 gauge canula.  The nucleus was rotated to confirm the effectiveness of the hydrodissection.  Phacoemulsification was carried out using a divide-and-conquer technique.  Total ultrasound time was 1 minute and 31.7 seconds with an average power of 25.1 percent.  Irrigation/aspiration was used to remove the residual cortex.  DisCoVisc was used to inflate the  capsule and the internal incision was enlarged to 3 mm with the crescent knife.  The intraocular lens was folded and inserted into the capsular bag using the AcrySert delivery system.  Irrigation/aspiration was used to remove the residual DisCoVisc.  A .10 mL of Vigamox containing 0.1 mg of drug was injected into the anterior chamber through the paracentesis track to inflate the anterior chamber and induce miosis.  The wound was checked for leaks and none were found. The conjunctiva was closed with cautery and the bridle sutures were removed.  Two drops of 0.3% Vigamox were placed on the eye.   An eye shield was placed on the eye.  The patient was discharged to the recovery room in good condition.  ____________________________ Loura Back Laureen Frederic, MD sad:ap D: 03/27/2014 12:32:51 ET T: 03/27/2014 14:23:28 ET JOB#: [id number]  cc: Remo Lipps A. Bernita Beckstrom, MD, <Dictator> Martie Lee MD ELECTRONICALLY SIGNED 04/03/2014 13:48

## 2014-06-11 NOTE — Op Note (Signed)
PATIENT NAME:  Diamond Boyd, Diamond Boyd MR#:  [id number] DATE OF BIRTH:  1940/12/27  DATE OF PROCEDURE:  02/27/2014  PREOPERATIVE DIAGNOSIS: Cataract, right eye.   POSTOPERATIVE DIAGNOSIS:  Cataract, right eye.  PROCEDURE PERFORMED: Extracapsular cataract extraction using phacoemulsification with placement of Alcon SN6CWS 14.5 diopter, posterior chamber lens, serial number [id number]   SURGEON: Remo Lipps A. Harmoney Sienkiewicz, M.D.   ANESTHESIA: 4% lidocaine and 0.75% Marcaine, a 50-50 mixture with 10 units/mL of Hylenex added, given as a peribulbar.   ANESTHESIOLOGIST:  Dr. Benjamine Mola.   COMPLICATIONS: None.   ESTIMATED BLOOD LOSS: Less than 1 mL.   PREOPERATIVE DIAGNOSIS:  Cataract, right eye.   POSTOPERATIVE DIAGNOSIS:  Cataract, right eye.   DESCRIPTION OF PROCEDURE:  The patient was brought to the operating room and given a peribulbar block.  The patient was then prepped and draped in the usual fashion.  The vertical rectus muscles were imbricated using 5-0 silk sutures.  These sutures were then clamped to the sterile drapes as bridle sutures.  A limbal peritomy was performed extending two clock hours and hemostasis was obtained with cautery.  A partial thickness scleral groove was made at the surgical limbus and dissected anteriorly in a lamellar dissection using an Alcon crescent knife.  The anterior chamber was entered superonasally with a Superblade and through the lamellar dissection with a 2.6 mm keratome.  DisCoVisc was used to replace the aqueous and a continuous tear capsulorrhexis was carried out.  Hydrodissection and hydrodelineation were carried out with balanced salt and a 27 gauge canula.  The nucleus was rotated to confirm the effectiveness of the hydrodissection.  Phacoemulsification was carried out using a divide-and-conquer technique.  Total ultrasound 1 minute and 32 seconds, average power of 25%, CEA of 43.56.    Irrigation/aspiration was used to remove the residual cortex.  DisCoVisc  was used to inflate the capsule and the internal incision was enlarged to 3 mm with the crescent knife.  The intraocular lens was folded and inserted into the capsular bag using the AcrySert delivery system.  Irrigation/aspiration was used to remove the residual DisCoVisc.  Vigamox 0.1 mL, containing 2 mg of drug was injected into the anterior chamber through the paracentesis track to inflate the anterior chamber and induce miosis.  The wound was checked for leaks and none were found. The conjunctiva was closed with cautery and the bridle sutures were removed.  Two drops of 0.3% Vigamox were placed on the eye.   An eye shield was placed on the eye.  The patient was discharged to the recovery room in good condition.     ____________________________ Loura Back Treveon Bourcier, MD sad:DT D: 02/27/2014 13:03:46 ET T: 02/27/2014 15:10:26 ET JOB#: [id number]  cc: Remo Lipps A. Eisa Conaway, MD, <Dictator> Martie Lee MD ELECTRONICALLY SIGNED 03/06/2014 13:11

## 2014-06-15 ENCOUNTER — Encounter: Payer: Self-pay | Admitting: Gastroenterology

## 2014-06-19 ENCOUNTER — Telehealth: Payer: Self-pay | Admitting: Hematology and Oncology

## 2014-06-19 ENCOUNTER — Ambulatory Visit (HOSPITAL_BASED_OUTPATIENT_CLINIC_OR_DEPARTMENT_OTHER): Payer: Medicare Other | Admitting: Hematology and Oncology

## 2014-06-19 ENCOUNTER — Other Ambulatory Visit (HOSPITAL_BASED_OUTPATIENT_CLINIC_OR_DEPARTMENT_OTHER): Payer: Medicare Other

## 2014-06-19 ENCOUNTER — Encounter: Payer: Self-pay | Admitting: Hematology and Oncology

## 2014-06-19 ENCOUNTER — Ambulatory Visit (HOSPITAL_BASED_OUTPATIENT_CLINIC_OR_DEPARTMENT_OTHER): Payer: Medicare Other

## 2014-06-19 VITALS — BP 135/66 | HR 77 | Temp 98.0°F | Resp 19 | Ht 65.0 in | Wt 219.3 lb

## 2014-06-19 DIAGNOSIS — G620 Drug-induced polyneuropathy: Secondary | ICD-10-CM | POA: Insufficient documentation

## 2014-06-19 DIAGNOSIS — C8190 Hodgkin lymphoma, unspecified, unspecified site: Secondary | ICD-10-CM | POA: Diagnosis not present

## 2014-06-19 DIAGNOSIS — Z95828 Presence of other vascular implants and grafts: Secondary | ICD-10-CM

## 2014-06-19 DIAGNOSIS — R748 Abnormal levels of other serum enzymes: Secondary | ICD-10-CM | POA: Diagnosis not present

## 2014-06-19 DIAGNOSIS — T451X5A Adverse effect of antineoplastic and immunosuppressive drugs, initial encounter: Secondary | ICD-10-CM

## 2014-06-19 DIAGNOSIS — Z85038 Personal history of other malignant neoplasm of large intestine: Secondary | ICD-10-CM | POA: Diagnosis not present

## 2014-06-19 DIAGNOSIS — C189 Malignant neoplasm of colon, unspecified: Secondary | ICD-10-CM

## 2014-06-19 HISTORY — DX: Drug-induced polyneuropathy: G62.0

## 2014-06-19 HISTORY — DX: Adverse effect of antineoplastic and immunosuppressive drugs, initial encounter: T45.1X5A

## 2014-06-19 LAB — COMPREHENSIVE METABOLIC PANEL (CC13)
ALBUMIN: 3.8 g/dL (ref 3.5–5.0)
ALT: 25 U/L (ref 0–55)
AST: 27 U/L (ref 5–34)
Alkaline Phosphatase: 169 U/L — ABNORMAL HIGH (ref 40–150)
Anion Gap: 9 mEq/L (ref 3–11)
BUN: 10.2 mg/dL (ref 7.0–26.0)
CO2: 26 mEq/L (ref 22–29)
Calcium: 9 mg/dL (ref 8.4–10.4)
Chloride: 105 mEq/L (ref 98–109)
Creatinine: 0.7 mg/dL (ref 0.6–1.1)
EGFR: 87 mL/min/{1.73_m2} — ABNORMAL LOW (ref >90)
Glucose: 84 mg/dl (ref 70–140)
POTASSIUM: 3.9 meq/L (ref 3.5–5.1)
Sodium: 140 mEq/L (ref 136–145)
Total Bilirubin: 0.52 mg/dL (ref 0.20–1.20)
Total Protein: 6 g/dL — ABNORMAL LOW (ref 6.4–8.3)

## 2014-06-19 LAB — CBC WITH DIFFERENTIAL/PLATELET
BASO%: 0.5 % (ref 0.0–2.0)
Basophils Absolute: 0 10*3/uL (ref 0.0–0.1)
EOS ABS: 0.3 10*3/uL (ref 0.0–0.5)
EOS%: 3.7 % (ref 0.0–7.0)
HEMATOCRIT: 36.5 % (ref 34.8–46.6)
HGB: 12.4 g/dL (ref 11.6–15.9)
LYMPH#: 3.7 10*3/uL — AB (ref 0.9–3.3)
LYMPH%: 47.1 % (ref 14.0–49.7)
MCH: 30.1 pg (ref 25.1–34.0)
MCHC: 34 g/dL (ref 31.5–36.0)
MCV: 88.6 fL (ref 79.5–101.0)
MONO#: 1 10*3/uL — AB (ref 0.1–0.9)
MONO%: 12.8 % (ref 0.0–14.0)
NEUT%: 35.9 % — AB (ref 38.4–76.8)
NEUTROS ABS: 2.8 10*3/uL (ref 1.5–6.5)
Platelets: 218 10*3/uL (ref 145–400)
RBC: 4.12 10*6/uL (ref 3.70–5.45)
RDW: 16.1 % — ABNORMAL HIGH (ref 11.2–14.5)
WBC: 7.8 10*3/uL (ref 3.9–10.3)

## 2014-06-19 LAB — LACTATE DEHYDROGENASE (CC13): LDH: 153 U/L (ref 125–245)

## 2014-06-19 MED ORDER — SODIUM CHLORIDE 0.9 % IJ SOLN
10.0000 mL | INTRAMUSCULAR | Status: DC | PRN
  Administered 2014-06-19: 10 mL via INTRAVENOUS
  Filled 2014-06-19: qty 10

## 2014-06-19 MED ORDER — HEPARIN SOD (PORK) LOCK FLUSH 100 UNIT/ML IV SOLN
500.0000 [IU] | Freq: Once | INTRAVENOUS | Status: AC
  Administered 2014-06-19: 500 [IU] via INTRAVENOUS
  Filled 2014-06-19: qty 5

## 2014-06-19 MED ORDER — GABAPENTIN 300 MG PO CAPS: 300.0000 mg | ORAL_CAPSULE | Freq: Three times a day (TID) | ORAL | Status: DC

## 2014-06-19 NOTE — Progress Notes (Signed)
[place] OFFICE PROGRESS NOTE  Patient Care Team: Abner Greenspan, MD as PCP - General Mosetta Anis, MD (Allergy)  SUMMARY OF ONCOLOGIC HISTORY:  History of low-grade marginal zone lymphoma, Hodgkin disease and colon cancer  HISTORY OF PRESENTING ILLNESS:  Diamond Boyd 74 y.o. female was transferred to my care after her prior physician has left.  I reviewed the patient's records extensive and collaborated the history with the patient. Summary of her history is as follows: [name] had a history of synchronous colon cancers dating back to August 2011 as well as the patient's history of a marginal zone lymphoma involving the bone marrow and peripheral blood dating back to 1994 is here for follow-up for her Hodkins lymphoma (stage iv with bone marrow involvement). She was treated with AVD from 06/08/2013 to 12/08/2013. PET CT scan on 12/27/2013 showed complete response to treatment. INTERVAL HISTORY: Please see below for problem oriented charting. Her main complaint today is regarding severe peripheral neuropathy, right leg worse than the left with associated back pain. She is rating her pain at 9 out of 10 pain. She denies taking pain medicine. She denies new neurological deficit. She denies new lymphadenopathy. She denies any recent change in bowel habits.  REVIEW OF SYSTEMS:   Constitutional: Denies fevers, chills or abnormal weight loss Eyes: Denies blurriness of vision Ears, nose, mouth, throat, and face: Denies mucositis or sore throat Respiratory: Denies cough, dyspnea or wheezes Cardiovascular: Denies palpitation, chest discomfort or lower extremity swelling Gastrointestinal:  Denies nausea, heartburn or change in bowel habits Skin: Denies abnormal skin rashes Lymphatics: Denies new lymphadenopathy or easy bruising Behavioral/Psych: Mood is stable, no new changes  All other systems were reviewed with the patient and are negative.  I have reviewed  the past medical history, past surgical history, social history and family history with the patient and they are unchanged from previous note.  ALLERGIES:  is allergic to achromycin; allopurinol; astelin; cephalexin; codeine; meloxicam; minocycline; nabumetone; nyquil; penicillins; sulfa antibiotics; zolpidem tartrate; buspar; and ciprofloxacin.  MEDICATIONS:  Current Outpatient Prescriptions  Medication Sig Dispense Refill  . Calcium Carbonate-Vitamin D (CALCIUM 600+D) 600-400 MG-UNIT per tablet Take 1 tablet by mouth daily.     . celecoxib (CELEBREX) 200 MG capsule Take 1 capsule (200 mg total) by mouth daily as needed for moderate pain (with food). 90 capsule 3  . cholecalciferol (VITAMIN D) 1000 UNITS tablet Take 1,000 Units by mouth daily.     . [facility] ALLERGY RELIEF 180 MG tablet TAKE 1 TABLET BY MOUTH DAILY. 90 tablet 1  . estradiol (ESTRACE) 2 MG tablet Take 0.5 tablets (1 mg total) by mouth daily. Takes 1/2 tablet 45 tablet 3  . FLUoxetine (PROZAC) 20 MG capsule Take 2 capsules (40 mg total) by mouth every morning. 180 capsule 3  . fluticasone (FLONASE) 50 MCG/ACT nasal spray Place 1 spray into both nostrils 2 (two) times daily. 48 g 3  . levothyroxine (SYNTHROID, LEVOTHROID) 112 MCG tablet Take 1 tablet (112 mcg  total) by mouth daily. 90 tablet 3  . metFORMIN (GLUCOPHAGE XR) 500 MG 24 hr tablet Take 1 tablet (500 mg total) by mouth daily with breakfast. 90 tablet 2  . Omega-3 350 MG CAPS Take 1 capsule by mouth daily.    Marland Kitchen omeprazole (PRILOSEC) 40 MG capsule Take 1 capsule (40 mg total) by mouth 2 (two) times daily. 180 capsule 1  . psyllium (METAMUCIL) 58.6 % packet Take 1-3 packets by mouth daily.     Marland Kitchen  simvastatin (ZOCOR) 40 MG tablet Take 1 tablet (40 mg total) by mouth daily. 90 tablet 3  . vitamin E (VITAMIN E) 400 UNIT capsule Take 400 Units by mouth daily.     . chlorpheniramine-HYDROcodone (TUSSIONEX) 10-8 MG/5ML LQCR Take 5 mLs by mouth 2 (two) times daily. (Patient not taking:  Reported on 06/19/2014) 1 Bottle 0  . EPIPEN 2-PAK 0.3 MG/0.3ML SOAJ injection as needed.    . furosemide (LASIX) 20 MG tablet Take 40 mg by mouth daily.    Marland Kitchen gabapentin (NEURONTIN) 300 MG capsule Take 1 capsule (300 mg total) by mouth 3 (three) times daily. 90 capsule 3  . lidocaine-prilocaine (EMLA) cream Apply 1 application topically as needed. (Patient not taking: Reported on 06/19/2014) 30 g 0  . LORazepam (ATIVAN) 0.5 MG tablet Take 1 tablet (0.5 mg total) by mouth every 6 (six) hours as needed (Nausea or vomiting). (Patient not taking: Reported on 06/19/2014) 30 tablet 0  . nystatin (MYCOSTATIN) powder Apply topically 4 (four) times daily. (Patient not taking: Reported on 06/19/2014) 30 g 0  . ondansetron (ZOFRAN) 8 MG tablet Take 1 tablet (8 mg total) by mouth 2 (two) times daily. Start the day after chemo for 3 days. Then take as needed for nausea or vomiting. (Patient not taking: Reported on 06/19/2014) 30 tablet 1  . oxyCODONE (OXY IR/ROXICODONE) 5 MG immediate release tablet Take 1 tablet (5 mg total) by mouth every 6 (six) hours as needed for severe pain. (Patient not taking: Reported on 06/19/2014) 60 tablet 0  . prochlorperazine (COMPAZINE) 10 MG tablet Take 1 tablet (10 mg total) by mouth every 6 (six) hours as needed (Nausea or vomiting). (Patient not taking: Reported on 06/19/2014) 30 tablet 1   Current Facility-Administered Medications  Medication Dose Route Frequency Provider Last Rate Last Dose  . 0.9 %  sodium chloride infusion  500 mL Intravenous Continuous Inda Castle, MD       Facility-Administered Medications Ordered in Other Visits  Medication Dose Route Frequency Provider Last Rate Last Dose  . sodium chloride 0.9 % injection 10 mL  10 mL Intravenous PRN [name], MD   10 mL at 06/19/14 1126    PHYSICAL EXAMINATION: ECOG PERFORMANCE STATUS: 1 - Symptomatic but completely ambulatory  Filed Vitals:   06/19/14 1142  BP: 135/66  Pulse: 77  Temp: 98 F (36.7 C)  Resp: 19    Filed Weights   06/19/14 1142  Weight: 219 lb 4.8 oz (99.474 kg)    GENERAL:alert, no distress and comfortable. She is morbidly obese SKIN: skin color, texture, turgor are normal, no rashes or significant lesions EYES: normal, Conjunctiva are pink and non-injected, sclera clear OROPHARYNX:no exudate, no erythema and lips, buccal mucosa, and tongue normal  NECK: supple, thyroid normal size, non-tender, without nodularity LYMPH:  no palpable lymphadenopathy in the cervical, axillary or inguinal LUNGS: clear to auscultation and percussion with normal breathing effort HEART: regular rate & rhythm and no murmurs and no lower extremity edema ABDOMEN:abdomen soft, non-tender and normal bowel sounds Musculoskeletal:no cyanosis of digits and no clubbing  NEURO: alert & oriented x 3 with fluent speech, no focal motor/sensory deficits  LABORATORY DATA:  I have reviewed the data as listed    Component Value Date/Time   NA 140 06/19/2014 1105   NA 131* 06/07/2013 2300   K 3.9 06/19/2014 1105   K 4.1 06/07/2013 2300   CL 92* 06/07/2013 2300   CL 105 07/14/2012 0939   CO2 26 06/19/2014  1105   CO2 21 06/07/2013 2300   GLUCOSE 84 06/19/2014 1105   GLUCOSE 117* 06/07/2013 2300   GLUCOSE 103* 07/14/2012 0939   BUN 10.2 06/19/2014 1105   BUN 17 06/07/2013 2300   CREATININE 0.7 06/19/2014 1105   CREATININE 0.74 06/07/2013 2300   CALCIUM 9.0 06/19/2014 1105   CALCIUM 9.3 06/07/2013 2300   CALCIUM 9.3 08/09/2009 0000   PROT 6.0* 06/19/2014 1105   PROT 5.7* 06/07/2013 2358   ALBUMIN 3.8 06/19/2014 1105   ALBUMIN 3.3* 06/07/2013 2358   AST 27 06/19/2014 1105   AST 121* 06/07/2013 2358   ALT 25 06/19/2014 1105   ALT 46* 06/07/2013 2358   ALKPHOS 169* 06/19/2014 1105   ALKPHOS 529* 06/07/2013 2358   BILITOT 0.52 06/19/2014 1105   BILITOT 2.5* 06/07/2013 2358   GFRNONAA 82* 06/07/2013 2300   GFRAA >90 06/07/2013 2300    No results found for: SPEP, UPEP  Lab Results  Component Value  Date   WBC 7.8 06/19/2014   NEUTROABS 2.8 06/19/2014   HGB 12.4 06/19/2014   HCT 36.5 06/19/2014   MCV 88.6 06/19/2014   PLT 218 06/19/2014      Chemistry      Component Value Date/Time   NA 140 06/19/2014 1105   NA 131* 06/07/2013 2300   K 3.9 06/19/2014 1105   K 4.1 06/07/2013 2300   CL 92* 06/07/2013 2300   CL 105 07/14/2012 0939   CO2 26 06/19/2014 1105   CO2 21 06/07/2013 2300   BUN 10.2 06/19/2014 1105   BUN 17 06/07/2013 2300   CREATININE 0.7 06/19/2014 1105   CREATININE 0.74 06/07/2013 2300      Component Value Date/Time   CALCIUM 9.0 06/19/2014 1105   CALCIUM 9.3 06/07/2013 2300   CALCIUM 9.3 08/09/2009 0000   ALKPHOS 169* 06/19/2014 1105   ALKPHOS 529* 06/07/2013 2358   AST 27 06/19/2014 1105   AST 121* 06/07/2013 2358   ALT 25 06/19/2014 1105   ALT 46* 06/07/2013 2358   BILITOT 0.52 06/19/2014 1105   BILITOT 2.5* 06/07/2013 2358     ASSESSMENT & PLAN:  Hodgkin lymphoma She has stage IV disease at presentation with bone marrow involvement. She is complaining of worsening pain radiating from the back to her legs with associated neuropathy. I recommend staging PET CT scan for evaluation and exclude cancer recurrence.   Colon cancer Clinically, she has no signs of recurrence. She is due for colonoscopy and she will schedule with her gastroenterologist.   Elevated liver enzymes Her recent abdominal ultrasound last year show possible fatty liver disease/liver cirrhosis. The patient is morbidly obese. She will continue risk factor management.   Peripheral neuropathy due to chemotherapy She is complaining of neuropathic pain which is worse. I suspect there might be a component of diabetic neuropathy, chemotherapy-induced neuropathy and possible neuropathy from pinched nerve. I recommend a trial of gabapentin. Some of the expected side effects of gabapentin including risks of sedation and constipation were discussed with the patient and she agreed to  proceed.    Orders Placed This Encounter  Procedures  . NM PET Image Restag (PS) Skull Base To Thigh    Standing Status: Future     Number of Occurrences:      Standing Expiration Date: 08/19/2015    Order Specific Question:  Reason for Exam (SYMPTOM  OR DIAGNOSIS REQUIRED)    Answer:  staging lymphoma, bone involvement, recurrent back pain    Order Specific Question:  Preferred  imaging location?    Answer:  [facility]   All questions were answered. The patient knows to call the clinic with any problems, questions or concerns. No barriers to learning was detected. I spent 25 minutes counseling the patient face to face. The total time spent in the appointment was 30 minutes and more than 50% was on counseling and review of test results     [facility], [place], MD 06/19/2014 12:36 PM

## 2014-06-19 NOTE — Assessment & Plan Note (Signed)
Clinically, she has no signs of recurrence. She is due for colonoscopy and she will schedule with her gastroenterologist. 

## 2014-06-19 NOTE — Telephone Encounter (Signed)
Pt confirmed labs/ov per 05/09 POF, gave pt AVS and Calendar..... KJ °

## 2014-06-19 NOTE — Assessment & Plan Note (Signed)
Her recent abdominal ultrasound last year show possible fatty liver disease/liver cirrhosis. The patient is morbidly obese. She will continue risk factor management. 

## 2014-06-19 NOTE — Assessment & Plan Note (Signed)
She has stage IV disease at presentation with bone marrow involvement. She is complaining of worsening pain radiating from the back to her legs with associated neuropathy. I recommend staging PET CT scan for evaluation and exclude cancer recurrence.

## 2014-06-19 NOTE — Assessment & Plan Note (Signed)
She is complaining of neuropathic pain which is worse. I suspect there might be a component of diabetic neuropathy, chemotherapy-induced neuropathy and possible neuropathy from pinched nerve. I recommend a trial of gabapentin. Some of the expected side effects of gabapentin including risks of sedation and constipation were discussed with the patient and she agreed to proceed.

## 2014-06-19 NOTE — Patient Instructions (Signed)

## 2014-06-27 ENCOUNTER — Ambulatory Visit (HOSPITAL_COMMUNITY)
Admission: RE | Admit: 2014-06-27 | Discharge: 2014-06-27 | Disposition: A | Discharge status: Home / Self Care | Payer: Medicare Other | Source: Ambulatory Visit | Attending: Hematology and Oncology | Admitting: Hematology and Oncology

## 2014-06-27 DIAGNOSIS — C8590 Non-Hodgkin lymphoma, unspecified, unspecified site: Secondary | ICD-10-CM | POA: Diagnosis not present

## 2014-06-27 DIAGNOSIS — C8190 Hodgkin lymphoma, unspecified, unspecified site: Secondary | ICD-10-CM

## 2014-06-27 DIAGNOSIS — R590 Localized enlarged lymph nodes: Secondary | ICD-10-CM | POA: Diagnosis not present

## 2014-06-27 LAB — GLUCOSE, CAPILLARY: Glucose-Capillary: 82 mg/dL (ref 65–99)

## 2014-06-27 MED ORDER — FLUDEOXYGLUCOSE F - 18 (FDG) INJECTION
11.9000 | Freq: Once | INTRAVENOUS | Status: AC | PRN
  Administered 2014-06-27: 11.9 via INTRAVENOUS

## 2014-06-29 ENCOUNTER — Encounter: Payer: Self-pay | Admitting: Hematology and Oncology

## 2014-06-29 ENCOUNTER — Telehealth: Payer: Self-pay | Admitting: Hematology and Oncology

## 2014-06-29 ENCOUNTER — Ambulatory Visit (HOSPITAL_BASED_OUTPATIENT_CLINIC_OR_DEPARTMENT_OTHER): Payer: Medicare Other | Admitting: Hematology and Oncology

## 2014-06-29 VITALS — BP 129/55 | HR 74 | Temp 98.4°F | Resp 18 | Ht 65.0 in | Wt 222.3 lb

## 2014-06-29 DIAGNOSIS — C8190 Hodgkin lymphoma, unspecified, unspecified site: Secondary | ICD-10-CM

## 2014-06-29 DIAGNOSIS — T451X5A Adverse effect of antineoplastic and immunosuppressive drugs, initial encounter: Secondary | ICD-10-CM

## 2014-06-29 DIAGNOSIS — G622 Polyneuropathy due to other toxic agents: Secondary | ICD-10-CM

## 2014-06-29 DIAGNOSIS — G620 Drug-induced polyneuropathy: Secondary | ICD-10-CM

## 2014-06-29 NOTE — Telephone Encounter (Signed)
Gave and printed appt sched and avs for pt for JUNE °

## 2014-06-29 NOTE — Assessment & Plan Note (Signed)
Unfortunately, PET CT scan suggested possible disease recurrence. I will order ultrasound core needle biopsy of the right neck lymph node to establish diagnosis.

## 2014-06-29 NOTE — Assessment & Plan Note (Signed)
I recommend a trial of gabapentin. She felt that gabapentin provided some relief. We will continue the same.

## 2014-06-29 NOTE — Progress Notes (Signed)
[place] OFFICE PROGRESS NOTE  Patient Care Team: Abner Greenspan, MD as PCP - General Mosetta Anis, MD (Allergy)  SUMMARY OF ONCOLOGIC HISTORY:   Hodgkin lymphoma   05/20/2013 Initial Diagnosis Hodgkin lymphoma   06/27/2014 Imaging Interval increase in metabolic activity of several small lymph nodes is concerning for lymphoma recurrence. Lymph nodes include a small right level 2 lymph node, right hilar lymph node, and right paratracheal lymph node    Colon cancer   06/17/2013 Initial Diagnosis Colon cancer   [name] 74 y.o. female was transferred to my care after her prior physician has left.  I reviewed the patient's records extensive and collaborated the history with the patient. Summary of her history is as follows: [name] had a history of synchronous colon cancers dating back to August 2011 as well as the patient's history of a marginal zone lymphoma involving the bone marrow and peripheral blood dating back to 1994 is here for follow-up for her Hodkins lymphoma (stage iv with bone marrow involvement). She was treated with AVD from 06/08/2013 to 12/08/2013. PET CT scan on 12/27/2013 showed complete response to treatment. INTERVAL HISTORY: Please see below for problem oriented charting. I reviewed her PET CT scan with the patient and her husband. She felt that the gabapentin was helpful in managing her pain. It has caused sedation but otherwise she feels fine  REVIEW OF SYSTEMS:   Constitutional: Denies fevers, chills or abnormal weight loss Eyes: Denies blurriness of vision Ears, nose, mouth, throat, and face: Denies mucositis or sore throat Respiratory: Denies cough, dyspnea or wheezes Cardiovascular: Denies palpitation, chest discomfort or lower extremity swelling Gastrointestinal:  Denies nausea, heartburn or change in bowel habits Skin: Denies abnormal skin rashes Lymphatics: Denies new lymphadenopathy or easy bruising Neurological:Denies  numbness, tingling or new weaknesses Behavioral/Psych: Mood is stable, no new changes  All other systems were reviewed with the patient and are negative.  I have reviewed the past medical history, past surgical history, social history and family history with the patient and they are unchanged from previous note.  ALLERGIES:  is allergic to achromycin; allopurinol; astelin; cephalexin; codeine; meloxicam; minocycline; nabumetone; nyquil; penicillins; sulfa antibiotics; zolpidem tartrate; buspar; and ciprofloxacin.  MEDICATIONS:  Current Outpatient Prescriptions  Medication Sig Dispense Refill  . Calcium Carbonate-Vitamin D (CALCIUM 600+D) 600-400 MG-UNIT per tablet Take 1 tablet by mouth daily.     . celecoxib (CELEBREX) 200 MG capsule Take 1 capsule (200 mg total) by mouth daily as needed for moderate pain (with food). 90 capsule 3  . chlorpheniramine-HYDROcodone (TUSSIONEX) 10-8 MG/5ML LQCR Take 5 mLs by mouth 2 (two) times daily. 1 Bottle 0  . cholecalciferol (VITAMIN D) 1000 UNITS tablet Take 1,000 Units by mouth daily.     . [facility] ALLERGY RELIEF 180 MG tablet TAKE 1 TABLET BY MOUTH DAILY. 90 tablet 1  . EPIPEN 2-PAK 0.3 MG/0.3ML SOAJ injection as needed.    Marland Kitchen estradiol (ESTRACE) 2 MG tablet Take 0.5 tablets (1 mg total) by mouth daily. Takes 1/2 tablet 45 tablet 3  . FLUoxetine (PROZAC) 20 MG capsule Take 2 capsules (40 mg total) by mouth every morning. 180 capsule 3  . fluticasone (FLONASE) 50 MCG/ACT nasal spray Place 1 spray into both nostrils 2 (two) times daily. 48 g 3  . furosemide (LASIX) 20 MG tablet Take 40 mg by mouth daily.    Marland Kitchen gabapentin (NEURONTIN) 300 MG capsule Take 1 capsule (300 mg total) by mouth 3 (three) times  daily. 90 capsule 3  . levothyroxine (SYNTHROID, LEVOTHROID) 112 MCG tablet Take 1 tablet (112 mcg  total) by mouth daily. 90 tablet 3  . lidocaine-prilocaine (EMLA) cream Apply 1 application topically as needed. 30 g 0  . LORazepam (ATIVAN) 0.5 MG tablet Take 1  tablet (0.5 mg total) by mouth every 6 (six) hours as needed (Nausea or vomiting). 30 tablet 0  . metFORMIN (GLUCOPHAGE XR) 500 MG 24 hr tablet Take 1 tablet (500 mg total) by mouth daily with breakfast. 90 tablet 2  . nystatin (MYCOSTATIN) powder Apply topically 4 (four) times daily. 30 g 0  . Omega-3 350 MG CAPS Take 1 capsule by mouth daily.    Marland Kitchen omeprazole (PRILOSEC) 40 MG capsule Take 1 capsule (40 mg total) by mouth 2 (two) times daily. 180 capsule 1  . ondansetron (ZOFRAN) 8 MG tablet Take 1 tablet (8 mg total) by mouth 2 (two) times daily. Start the day after chemo for 3 days. Then take as needed for nausea or vomiting. 30 tablet 1  . oxyCODONE (OXY IR/ROXICODONE) 5 MG immediate release tablet Take 1 tablet (5 mg total) by mouth every 6 (six) hours as needed for severe pain. 60 tablet 0  . prochlorperazine (COMPAZINE) 10 MG tablet Take 1 tablet (10 mg total) by mouth every 6 (six) hours as needed (Nausea or vomiting). 30 tablet 1  . psyllium (METAMUCIL) 58.6 % packet Take 1-3 packets by mouth daily.     . simvastatin (ZOCOR) 40 MG tablet Take 1 tablet (40 mg total) by mouth daily. 90 tablet 3  . vitamin E (VITAMIN E) 400 UNIT capsule Take 400 Units by mouth daily.      Current Facility-Administered Medications  Medication Dose Route Frequency Provider Last Rate Last Dose  . 0.9 %  sodium chloride infusion  500 mL Intravenous Continuous Inda Castle, MD        PHYSICAL EXAMINATION: ECOG PERFORMANCE STATUS: 1 - Symptomatic but completely ambulatory  Filed Vitals:   06/29/14 0934  BP: 129/55  Pulse: 74  Temp: 98.4 F (36.9 C)  Resp: 18   Filed Weights   06/29/14 0934  Weight: 222 lb 4.8 oz (100.835 kg)    GENERAL:alert, no distress and comfortable SKIN: skin color, texture, turgor are normal, no rashes or significant lesions EYES: normal, Conjunctiva are pink and non-injected, sclera clear Musculoskeletal:no cyanosis of digits and no clubbing  NEURO: alert & oriented x 3  with fluent speech, no focal motor/sensory deficits  LABORATORY DATA:  I have reviewed the data as listed    Component Value Date/Time   NA 140 06/19/2014 1105   NA 131* 06/07/2013 2300   K 3.9 06/19/2014 1105   K 4.1 06/07/2013 2300   CL 92* 06/07/2013 2300   CL 105 07/14/2012 0939   CO2 26 06/19/2014 1105   CO2 21 06/07/2013 2300   GLUCOSE 84 06/19/2014 1105   GLUCOSE 117* 06/07/2013 2300   GLUCOSE 103* 07/14/2012 0939   BUN 10.2 06/19/2014 1105   BUN 17 06/07/2013 2300   CREATININE 0.7 06/19/2014 1105   CREATININE 0.74 06/07/2013 2300   CALCIUM 9.0 06/19/2014 1105   CALCIUM 9.3 06/07/2013 2300   CALCIUM 9.3 08/09/2009 0000   PROT 6.0* 06/19/2014 1105   PROT 5.7* 06/07/2013 2358   ALBUMIN 3.8 06/19/2014 1105   ALBUMIN 3.3* 06/07/2013 2358   AST 27 06/19/2014 1105   AST 121* 06/07/2013 2358   ALT 25 06/19/2014 1105   ALT 46* 06/07/2013  2358   ALKPHOS 169* 06/19/2014 1105   ALKPHOS 529* 06/07/2013 2358   BILITOT 0.52 06/19/2014 1105   BILITOT 2.5* 06/07/2013 2358   GFRNONAA 82* 06/07/2013 2300   GFRAA >90 06/07/2013 2300    No results found for: SPEP, UPEP  Lab Results  Component Value Date   WBC 7.8 06/19/2014   NEUTROABS 2.8 06/19/2014   HGB 12.4 06/19/2014   HCT 36.5 06/19/2014   MCV 88.6 06/19/2014   PLT 218 06/19/2014      Chemistry      Component Value Date/Time   NA 140 06/19/2014 1105   NA 131* 06/07/2013 2300   K 3.9 06/19/2014 1105   K 4.1 06/07/2013 2300   CL 92* 06/07/2013 2300   CL 105 07/14/2012 0939   CO2 26 06/19/2014 1105   CO2 21 06/07/2013 2300   BUN 10.2 06/19/2014 1105   BUN 17 06/07/2013 2300   CREATININE 0.7 06/19/2014 1105   CREATININE 0.74 06/07/2013 2300      Component Value Date/Time   CALCIUM 9.0 06/19/2014 1105   CALCIUM 9.3 06/07/2013 2300   CALCIUM 9.3 08/09/2009 0000   ALKPHOS 169* 06/19/2014 1105   ALKPHOS 529* 06/07/2013 2358   AST 27 06/19/2014 1105   AST 121* 06/07/2013 2358   ALT 25 06/19/2014 1105   ALT  46* 06/07/2013 2358   BILITOT 0.52 06/19/2014 1105   BILITOT 2.5* 06/07/2013 2358       RADIOGRAPHIC STUDIES: I have personally reviewed the radiological images as listed and agreed with the findings in the report. Nm Pet Image Restag (ps) Skull Base To Thigh  06/27/2014   CLINICAL DATA:  Subsequent treatment strategy for lymphoma.  EXAM: NUCLEAR MEDICINE PET SKULL BASE TO THIGH  TECHNIQUE: 11.9 mCi F-18 FDG was injected intravenously. Full-ring PET imaging was performed from the skull base to thigh after the radiotracer. CT data was obtained and used for attenuation correction and anatomic localization.  FASTING BLOOD GLUCOSE:  Value: 82 mg/dl  COMPARISON:  None.  FINDINGS: NECK  There is a new hypermetabolic right level IIa lymph node superficial to the right submandibular gland measuring 10 mm short axis image 34 series 4 with SUV max equal 5.6. This is increased from 2.5 on most recent PET-CT scan.  There is again demonstrated diffuse intense metabolic activity within the thyroid gland which is stable over multiple comparison exams.  CHEST  There is a new hypermetabolic right hilar lymph node with SUV max equal 8.2. This node is difficult to see on the noncontrast CT.  There is additional hypermetabolic small right paratracheal lymph node measuring only 6 mm short axis on image 64 series 4 however intensely metabolic for size with SUV max equal 4.4.  No suspicious pulmonary nodules.  ABDOMEN/PELVIS  No hypermetabolic abdominal pelvic lymph nodes. Post splenectomy. No abnormal activity in the liver.  Mild activity along a ventral hernia repair is consistent with postsurgical inflammation.  SKELETON  There is a focus of activity in the right sacrum with SUV max equal 4.1. There is a smudgy lesion measuring 14 mm on image 155 series 4 at this level  IMPRESSION: 1. Interval increase in metabolic activity of several small lymph nodes is concerning for lymphoma recurrence. Lymph nodes include a small right  level 2 lymph node, right hilar lymph node, and right paratracheal lymph node. 2. Subtle focus of increased uptake within the right sacrum is indeterminate. Recommend attention on follow-up. 3. Persistent uptake within thyroid gland most consistent thyroiditis.  Electronically Signed   By: Suzy Bouchard M.D.   On: 06/27/2014 16:19     ASSESSMENT & PLAN:  Hodgkin lymphoma Unfortunately, PET CT scan suggested possible disease recurrence. I will order ultrasound core needle biopsy of the right neck lymph node to establish diagnosis.   Peripheral neuropathy due to chemotherapy I recommend a trial of gabapentin. She felt that gabapentin provided some relief. We will continue the same.    Orders Placed This Encounter  Procedures  . [place] Core Biopsy    Standing Status: Future     Number of Occurrences:      Standing Expiration Date: 08/29/2015    Order Specific Question:  Reason for Exam (SYMPTOM  OR DIAGNOSIS REQUIRED)    Answer:  right neck lymph node biopsy as noted on PET scan    Order Specific Question:  Preferred imaging location?    Answer:  [facility]   All questions were answered. The patient knows to call the clinic with any problems, questions or concerns. No barriers to learning was detected. I spent 15 minutes counseling the patient face to face. The total time spent in the appointment was 20 minutes and more than 50% was on counseling and review of test results     [facility], [place], MD 06/29/2014 9:53 AM

## 2014-07-06 ENCOUNTER — Other Ambulatory Visit: Payer: Self-pay | Admitting: Radiology

## 2014-07-07 ENCOUNTER — Ambulatory Visit (HOSPITAL_COMMUNITY)
Admission: RE | Admit: 2014-07-07 | Discharge: 2014-07-07 | Disposition: A | Discharge status: Home / Self Care | Payer: Medicare Other | Source: Ambulatory Visit | Attending: Hematology and Oncology | Admitting: Hematology and Oncology

## 2014-07-07 ENCOUNTER — Encounter (HOSPITAL_COMMUNITY): Payer: Self-pay

## 2014-07-07 ENCOUNTER — Other Ambulatory Visit: Payer: Self-pay | Admitting: Hematology and Oncology

## 2014-07-07 DIAGNOSIS — Z8572 Personal history of non-Hodgkin lymphomas: Secondary | ICD-10-CM | POA: Insufficient documentation

## 2014-07-07 DIAGNOSIS — R590 Localized enlarged lymph nodes: Secondary | ICD-10-CM | POA: Insufficient documentation

## 2014-07-07 DIAGNOSIS — C8190 Hodgkin lymphoma, unspecified, unspecified site: Secondary | ICD-10-CM

## 2014-07-07 DIAGNOSIS — Z8571 Personal history of Hodgkin lymphoma: Secondary | ICD-10-CM | POA: Diagnosis not present

## 2014-07-07 DIAGNOSIS — R938 Abnormal findings on diagnostic imaging of other specified body structures: Secondary | ICD-10-CM | POA: Insufficient documentation

## 2014-07-07 LAB — CBC
HEMATOCRIT: 36.5 % (ref 36.0–46.0)
HEMOGLOBIN: 12.3 g/dL (ref 12.0–15.0)
MCH: 29.9 pg (ref 26.0–34.0)
MCHC: 33.7 g/dL (ref 30.0–36.0)
MCV: 88.6 fL (ref 78.0–100.0)
PLATELETS: 239 10*3/uL (ref 150–400)
RBC: 4.12 MIL/uL (ref 3.87–5.11)
RDW: 16.3 % — ABNORMAL HIGH (ref 11.5–15.5)
WBC: 7.1 10*3/uL (ref 4.0–10.5)

## 2014-07-07 LAB — APTT: APTT: 41 s — AB (ref 24–37)

## 2014-07-07 LAB — PROTIME-INR
INR: 0.97 (ref 0.00–1.49)
Prothrombin Time: 13.1 seconds (ref 11.6–15.2)

## 2014-07-07 MED ORDER — FENTANYL CITRATE (PF) 100 MCG/2ML IJ SOLN
INTRAMUSCULAR | Status: AC | PRN
  Administered 2014-07-07: 50 ug via INTRAVENOUS

## 2014-07-07 MED ORDER — MIDAZOLAM HCL 2 MG/2ML IJ SOLN
INTRAMUSCULAR | Status: AC
  Filled 2014-07-07: qty 2

## 2014-07-07 MED ORDER — SODIUM CHLORIDE 0.9 % IV SOLN
INTRAVENOUS | Status: AC | PRN
  Administered 2014-07-07: 10 mL/h via INTRAVENOUS

## 2014-07-07 MED ORDER — FENTANYL CITRATE (PF) 100 MCG/2ML IJ SOLN
INTRAMUSCULAR | Status: AC
  Filled 2014-07-07: qty 2

## 2014-07-07 MED ORDER — MIDAZOLAM HCL 2 MG/2ML IJ SOLN
INTRAMUSCULAR | Status: AC | PRN
  Administered 2014-07-07: 1 mg via INTRAVENOUS

## 2014-07-07 MED ORDER — LIDOCAINE HCL (PF) 1 % IJ SOLN
INTRAMUSCULAR | Status: AC
  Filled 2014-07-07: qty 10

## 2014-07-07 MED ORDER — SODIUM CHLORIDE 0.9 % IV SOLN: Freq: Once | INTRAVENOUS | Status: DC

## 2014-07-07 NOTE — Sedation Documentation (Signed)
Patient is resting comfortably. 

## 2014-07-07 NOTE — Procedures (Signed)
US guided core biopsies of right submandibular lymph node.  6 cores obtained.  No immediate complication.  Minimal blood loss.

## 2014-07-07 NOTE — Sedation Documentation (Signed)
Patient denies pain and is resting comfortably.  
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
## 2014-07-07 NOTE — Discharge Instructions (Signed)
Biopsia por puncin - Cuidados posteriores (Needle Biopsy, Care After)  Estas indicaciones le proporcionan informacin general acerca de cmo deber cuidarse despus del procedimiento. El mdico tambin podr darle instrucciones especficas. Comunquese con el mdico si tiene algn problema o tiene preguntas despus del procedimiento.  INSTRUCCIONES PARA EL CUIDADO DOMICILIARIO  Haga reposo durante 4 horas luego de la biopsia, excepto si tiene que levantarse para ir al bao, o segn se le haya indicado.  Mantenga limpio y Higher education careers adviser por donde se introdujeron las agujas.  No se coloque polvos ni lociones.  No se duche hasta que hayan pasado las 24 horas del examen. Qutese todos las compresas (vendajes) antes de ducharse.  Qutese los vendajes al menos una vez al da. Higienice suavemente la zona con agua y [place]. Coloque un vendaje hasta que la herida est cerrada. Averigue los [employer] de su anlisis Consulte con su mdico la fecha en que los resultados estarn disponibles. Concurra a los controles y a Chief Strategy Officer [employer]. SOLICITE AYUDA DE INMEDIATO SI:  Le falta el aire o tiene dificultad para respirar.  Siente dolor o clicos en el vientre (abdomen).  Tiene malestar estomacal (nuseas) o vmitos.  Algunos de los lugares de insercin de las agujas:  Estn hinchados o rojos.  Le duelen o estn calientes al tacto.  Observa una secrecin blanco amarillenta (pus).  Tiene hemorragia luego de presionar durante 10 minutos la zona. Solicite a Animal nutritionist presione el sitio de la hemorragia hasta que lo vea el mdico.  Siente un dolor diferente que no se detiene.  Tiene fiebre. Si concurre al servicio de urgencias, dgale al [employer] le han practicado una biopsia. Lleve este folleto para mostrarlo al enfermero.  ASEGRESE DE QUE:   Comprende estas instrucciones.  Controlar su enfermedad.  Solicitar ayuda de inmediato si no mejora o si  empeora. Document Released: 04/25/2008 Document Revised: 04/21/2011 [facility] Patient Information 2015 [employer]. This information is not intended to replace advice given to you by your health care provider. Make sure you discuss any questions you have with your health care provider.

## 2014-07-12 ENCOUNTER — Other Ambulatory Visit: Payer: Self-pay | Admitting: Hematology and Oncology

## 2014-07-12 ENCOUNTER — Telehealth: Payer: Self-pay | Admitting: *Deleted

## 2014-07-12 DIAGNOSIS — C8190 Hodgkin lymphoma, unspecified, unspecified site: Secondary | ICD-10-CM

## 2014-07-12 NOTE — Telephone Encounter (Signed)
Informed pt of Dr. Calton Dach message below and to expect a call from [facility] for appt w/ Dr. Dalbert Batman.  Please let us know by Friday morning if she has not heard anything from them yet.  Informed her of appt w/ Dr. Alvy Bimler on 6/23 at 12 pm.  She verbalized understanding.

## 2014-07-12 NOTE — Telephone Encounter (Signed)
-----   Message from [name], MD sent at 07/12/2014 10:26 AM EDT ----- Regarding: non diagnostic Pls let her know biopsy is not enough tissue. I placed urgent general surgery consult and I will also send inbasket to Dr. Dalbert Batman for excisional LN biopsy. If the patient have not heard back by Friday, please let us know. I will cancel her appt tomorrow and move it to 3 weeks from now to 6/23 at 12 pm Please let her know

## 2014-07-13 ENCOUNTER — Ambulatory Visit: Payer: Medicare Other | Admitting: Hematology and Oncology

## 2014-07-13 ENCOUNTER — Telehealth: Payer: Self-pay | Admitting: Hematology and Oncology

## 2014-07-13 NOTE — Telephone Encounter (Signed)
returned call and s.w. pt husband and confirmed cx todays appt...ok and aware

## 2014-07-14 ENCOUNTER — Telehealth: Payer: Self-pay | Admitting: *Deleted

## 2014-07-14 ENCOUNTER — Telehealth: Payer: Self-pay | Admitting: Hematology and Oncology

## 2014-07-14 NOTE — Telephone Encounter (Signed)
Spoke with patient. States she has an appt scheduled with [facility]

## 2014-07-14 NOTE — Telephone Encounter (Signed)
Faxed pt medical records to Dr. Dalbert Batman

## 2014-07-14 NOTE — Telephone Encounter (Signed)
Sent msg to TM/KH for referral to [facility] w/Dr. Renelda Loma... KJ

## 2014-07-21 DIAGNOSIS — Z6835 Body mass index (BMI) 35.0-35.9, adult: Secondary | ICD-10-CM | POA: Diagnosis not present

## 2014-07-21 DIAGNOSIS — Z9889 Other specified postprocedural states: Secondary | ICD-10-CM | POA: Diagnosis not present

## 2014-07-21 DIAGNOSIS — Z9081 Acquired absence of spleen: Secondary | ICD-10-CM | POA: Diagnosis not present

## 2014-07-21 DIAGNOSIS — R221 Localized swelling, mass and lump, neck: Secondary | ICD-10-CM | POA: Diagnosis not present

## 2014-07-21 DIAGNOSIS — Z8572 Personal history of non-Hodgkin lymphomas: Secondary | ICD-10-CM | POA: Diagnosis not present

## 2014-07-21 DIAGNOSIS — E119 Type 2 diabetes mellitus without complications: Secondary | ICD-10-CM | POA: Diagnosis not present

## 2014-07-21 DIAGNOSIS — F329 Major depressive disorder, single episode, unspecified: Secondary | ICD-10-CM | POA: Diagnosis not present

## 2014-07-25 ENCOUNTER — Telehealth: Payer: Self-pay | Admitting: *Deleted

## 2014-07-25 ENCOUNTER — Telehealth: Payer: Self-pay | Admitting: Hematology and Oncology

## 2014-07-25 DIAGNOSIS — C8590 Non-Hodgkin lymphoma, unspecified, unspecified site: Secondary | ICD-10-CM | POA: Diagnosis not present

## 2014-07-25 DIAGNOSIS — R590 Localized enlarged lymph nodes: Secondary | ICD-10-CM | POA: Diagnosis not present

## 2014-07-25 NOTE — Telephone Encounter (Signed)
I discussed her case with the ENT surgeon. Ultimately, we agreed to wait a month to allow that the lymph node to become a bit more obvious before he take her in for excisional lymph node biopsy which I think is reasonable. I will cancel appointment next week and wait to hear the callback from ENT surgeon.

## 2014-07-25 NOTE — Telephone Encounter (Signed)
Pt states she has appt this morning w/ ENT.   She doesn't remember the name.  I will call pt back later this afternoon to find out the name and if they have scheduled her for biopsy.   Pt verbalized understanding.

## 2014-07-25 NOTE — Telephone Encounter (Signed)
-----   Message from [name], MD sent at 07/25/2014  8:18 AM EDT ----- Regarding: ENT appt Can you find out when is ENT appt? We might have to reschedule until we have biopsy

## 2014-07-25 NOTE — Telephone Encounter (Signed)
ENT called Dr. Alvy Bimler and recommends waiting one month before doing biopsy.  He will let pt know her appt w/ Dr. Alvy Bimler will be canceled for now until after pt seen again by ENT.

## 2014-07-26 ENCOUNTER — Other Ambulatory Visit: Payer: Self-pay | Admitting: Hematology and Oncology

## 2014-07-26 ENCOUNTER — Telehealth: Payer: Self-pay | Admitting: Hematology and Oncology

## 2014-07-26 ENCOUNTER — Telehealth: Payer: Self-pay | Admitting: *Deleted

## 2014-07-26 NOTE — Telephone Encounter (Signed)
lvm for pt regarding to added flushes....mailed pt appt sched and letter

## 2014-07-26 NOTE — Telephone Encounter (Signed)
Pt left VM for Dr. Alvy Bimler.  She is concerned about the danger of nerve damage to her face w/ biospsy.  States she was told about this danger by Dr. Redmond Baseman yesterday.  She asks if she should get second opinion at [facility] or [facility]? She asks if Dr. Alvy Bimler aware of any "less dangerous techniques" for this biopsy?

## 2014-07-26 NOTE — Telephone Encounter (Signed)
I discussed with the patient the risk and benefits of tertiary referral. She is okay to wait one month until her next ENT visit. I will schedule her port flushed next month.

## 2014-08-03 ENCOUNTER — Ambulatory Visit: Payer: Medicare Other | Admitting: Hematology and Oncology

## 2014-08-16 ENCOUNTER — Ambulatory Visit (HOSPITAL_BASED_OUTPATIENT_CLINIC_OR_DEPARTMENT_OTHER): Payer: Medicare Other

## 2014-08-16 VITALS — BP 138/59 | HR 77 | Temp 98.4°F

## 2014-08-16 DIAGNOSIS — Z95828 Presence of other vascular implants and grafts: Secondary | ICD-10-CM

## 2014-08-16 DIAGNOSIS — C8190 Hodgkin lymphoma, unspecified, unspecified site: Secondary | ICD-10-CM | POA: Diagnosis not present

## 2014-08-16 DIAGNOSIS — Z452 Encounter for adjustment and management of vascular access device: Secondary | ICD-10-CM

## 2014-08-16 MED ORDER — SODIUM CHLORIDE 0.9 % IJ SOLN
10.0000 mL | INTRAMUSCULAR | Status: DC | PRN
  Administered 2014-08-16: 10 mL via INTRAVENOUS
  Filled 2014-08-16: qty 10

## 2014-08-16 MED ORDER — HEPARIN SOD (PORK) LOCK FLUSH 100 UNIT/ML IV SOLN
500.0000 [IU] | Freq: Once | INTRAVENOUS | Status: AC
Start: 2014-08-16 — End: 2014-08-16
  Administered 2014-08-16: 500 [IU] via INTRAVENOUS
  Filled 2014-08-16: qty 5

## 2014-08-16 NOTE — Patient Instructions (Signed)

## 2014-08-22 ENCOUNTER — Ambulatory Visit (AMBULATORY_SURGERY_CENTER): Payer: Self-pay

## 2014-08-22 VITALS — Ht 65.5 in | Wt 229.0 lb

## 2014-08-22 DIAGNOSIS — C189 Malignant neoplasm of colon, unspecified: Secondary | ICD-10-CM

## 2014-08-22 MED ORDER — SUPREP BOWEL PREP KIT 17.5-3.13-1.6 GM/177ML PO SOLN: 1.0000 | Freq: Once | ORAL | Status: DC

## 2014-08-22 NOTE — Progress Notes (Signed)
No allergies to eggs or soy No diet/weight loss meds No home oxygen No past problems with anesthesia  Does not want emmi instructions; has had colonoscopies before

## 2014-08-23 ENCOUNTER — Other Ambulatory Visit: Payer: Self-pay | Admitting: Family Medicine

## 2014-08-23 ENCOUNTER — Other Ambulatory Visit: Payer: Self-pay | Admitting: Hematology

## 2014-08-24 DIAGNOSIS — C8590 Non-Hodgkin lymphoma, unspecified, unspecified site: Secondary | ICD-10-CM | POA: Diagnosis not present

## 2014-08-24 DIAGNOSIS — R590 Localized enlarged lymph nodes: Secondary | ICD-10-CM | POA: Diagnosis not present

## 2014-08-29 ENCOUNTER — Telehealth: Payer: Self-pay | Admitting: *Deleted

## 2014-08-29 ENCOUNTER — Telehealth: Payer: Self-pay

## 2014-08-29 ENCOUNTER — Telehealth: Payer: Self-pay | Admitting: Hematology and Oncology

## 2014-08-29 ENCOUNTER — Other Ambulatory Visit: Payer: Self-pay | Admitting: Hematology and Oncology

## 2014-08-29 DIAGNOSIS — C8190 Hodgkin lymphoma, unspecified, unspecified site: Secondary | ICD-10-CM

## 2014-08-29 NOTE — Telephone Encounter (Signed)
Spoke w Dr. Redmond Baseman He recommend repeat scan in 1 month I placed POF for labs, PET scan and see her next month

## 2014-08-29 NOTE — Telephone Encounter (Signed)
Diamond Boyd already s/w pt

## 2014-08-29 NOTE — Telephone Encounter (Signed)
I spoke with ENT surgeon. The lymph nodes were very small and technically difficult to biopsy. After much discussion, I recommend repeat blood work and imaging next month for further review.

## 2014-08-29 NOTE — Telephone Encounter (Signed)
Spoke w Dr. Redmond Baseman He recommend repeat scan in 1 month I placed POF for labs, PET scan and see her next month  Pt notified of message. Schedulers to call her with appts

## 2014-08-29 NOTE — Telephone Encounter (Signed)
Pt called asking if Dr Redmond Baseman ENT has talked with Dr Alvy Bimler yet. He was going to talk about the biopsy.  Pt has no appt scheduled at present with Dr Alvy Bimler or Dr Redmond Baseman. Pt has colonoscopy scheduled for July 26.  Pt has new pain in her low back in the bones. Prior has always been in the muscles. "real sore in my spine" nagging irritating. Has to turn on side to ease soreness to sleep. Has not used any tylenol or pain pills.

## 2014-08-30 ENCOUNTER — Telehealth: Payer: Self-pay | Admitting: Hematology and Oncology

## 2014-08-30 NOTE — Telephone Encounter (Signed)
lvm for pt regarding to Aug appt...mailed pt appt sched/avs adn letter °

## 2014-09-05 ENCOUNTER — Encounter: Payer: Self-pay | Admitting: Gastroenterology

## 2014-09-05 ENCOUNTER — Ambulatory Visit (AMBULATORY_SURGERY_CENTER): Payer: Medicare Other | Admitting: Gastroenterology

## 2014-09-05 VITALS — BP 137/73 | HR 68 | Temp 97.6°F | Resp 15 | Ht 65.0 in | Wt 229.0 lb

## 2014-09-05 DIAGNOSIS — D122 Benign neoplasm of ascending colon: Secondary | ICD-10-CM

## 2014-09-05 DIAGNOSIS — K621 Rectal polyp: Secondary | ICD-10-CM

## 2014-09-05 DIAGNOSIS — Z85038 Personal history of other malignant neoplasm of large intestine: Secondary | ICD-10-CM | POA: Diagnosis not present

## 2014-09-05 DIAGNOSIS — I739 Peripheral vascular disease, unspecified: Secondary | ICD-10-CM | POA: Diagnosis not present

## 2014-09-05 DIAGNOSIS — K219 Gastro-esophageal reflux disease without esophagitis: Secondary | ICD-10-CM | POA: Diagnosis not present

## 2014-09-05 DIAGNOSIS — C189 Malignant neoplasm of colon, unspecified: Secondary | ICD-10-CM

## 2014-09-05 DIAGNOSIS — K648 Other hemorrhoids: Secondary | ICD-10-CM

## 2014-09-05 DIAGNOSIS — E039 Hypothyroidism, unspecified: Secondary | ICD-10-CM | POA: Diagnosis not present

## 2014-09-05 DIAGNOSIS — Z85040 Personal history of malignant carcinoid tumor of rectum: Secondary | ICD-10-CM | POA: Diagnosis not present

## 2014-09-05 DIAGNOSIS — D128 Benign neoplasm of rectum: Secondary | ICD-10-CM

## 2014-09-05 DIAGNOSIS — I447 Left bundle-branch block, unspecified: Secondary | ICD-10-CM | POA: Diagnosis not present

## 2014-09-05 MED ORDER — SODIUM CHLORIDE 0.9 % IV SOLN: 500.0000 mL | INTRAVENOUS | Status: DC

## 2014-09-05 NOTE — Op Note (Signed)
[place]  [address] [place], [id number]   COLONOSCOPY PROCEDURE REPORT  PATIENT: Diamond Boyd, Diamond Boyd  MR#: [id number] BIRTHDATE: 12-Dec-1940 , 30  yrs. old GENDER: female ENDOSCOPIST: Inda Castle, MD REFERRED BY: PROCEDURE DATE:  09/05/2014 PROCEDURE:   Colonoscopy, surveillance and Colonoscopy with snare polypectomy First Screening Colonoscopy - Avg.  risk and is 50 yrs.  old or older - No.  Prior Negative Screening - Now for repeat screening. N/A  History of Adenoma - Now for follow-up colonoscopy & has been > or = to 3 yrs.  No.  It has been less than 3 yrs since last colonoscopy.  Other: See Comments  Polyps removed today? 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  The digital rectal exam revealed no abnormalities of the rectum.   The LB CF-H180AL Loaner [id number] endoscope was introduced through the anus and advanced to the cecum, which was identified by both the appendix and ileocecal valve.approximate two thirds of the colon was surgically absent. No adverse events experienced.   The quality of the prep was (Suprep was used) excellent.  The instrument was then slowly withdrawn as the colon was fully examined. Estimated blood loss is zero unless otherwise noted in this procedure report.      COLON FINDINGS: A sessile polyp measuring 4 mm in size was found in the ascending colon.  A polypectomy was performed with cold forceps.  The resection was complete, the polyp tissue was completely retrieved and sent to histology.   A flat polyp measuring 2 mm in size was found in the rectum.  A polypectomy was performed with a cold snare.  The resection was complete, the polyp tissue  was completely retrieved and sent to histology.   Internal hemorrhoids were found.  Retroflexed views revealed no abnormalities. The time to cecum = 1.0 Withdrawal time = 6.0   The scope was withdrawn and the procedure completed. COMPLICATIONS: There were no immediate complications.  ENDOSCOPIC IMPRESSION: 1.   Sessile polyp was found in the ascending colon; polypectomy was performed with cold forceps 2.   Flat polyp was found in the rectum; polypectomy was performed with a cold snare 3.   Internal hemorrhoids  RECOMMENDATIONS: Repeat Colonoscopy in 5 years.  eSigned:  Inda Castle, MD 09/05/2014 11:34 AM   cc: [name], MD, [name], MD   PATIENT NAME:  Diamond Boyd, Diamond Boyd MR#: [id number]

## 2014-09-05 NOTE — Progress Notes (Signed)
Report to PACU, RN, vss, BBS= Clear.  

## 2014-09-05 NOTE — Progress Notes (Signed)
Called to room to assist during endoscopic procedure.  Patient ID and intended procedure confirmed with present staff. Received instructions for my participation in the procedure from the performing physician.  

## 2014-09-05 NOTE — Patient Instructions (Signed)
YOU HAD AN ENDOSCOPIC PROCEDURE TODAY AT [facility]:   Refer to the procedure report that was given to you for any specific questions about what was found during the examination.  If the procedure report does not answer your questions, please call your gastroenterologist to clarify.  If you requested that your care partner not be given the details of your procedure findings, then the procedure report has been included in a sealed envelope for you to review at your convenience later.  YOU SHOULD EXPECT: Some feelings of bloating in the abdomen. Passage of more gas than usual.  Walking can help get rid of the air that was put into your GI tract during the procedure and reduce the bloating. If you had a lower endoscopy (such as a colonoscopy or flexible sigmoidoscopy) you may notice spotting of blood in your stool or on the toilet paper. If you underwent a bowel prep for your procedure, you may not have a normal bowel movement for a few days.  Please Note:  You might notice some irritation and congestion in your nose or some drainage.  This is from the oxygen used during your procedure.  There is no need for concern and it should clear up in a day or so.  SYMPTOMS TO REPORT IMMEDIATELY:   Following lower endoscopy (colonoscopy or flexible sigmoidoscopy):  Excessive amounts of blood in the stool  Significant tenderness or worsening of abdominal pains  Swelling of the abdomen that is new, acute  Fever of 100F or higher   For urgent or emergent issues, a gastroenterologist can be reached at any hour by calling [phone].   DIET: Your first meal following the procedure should be a small meal and then it is ok to progress to your normal diet. Heavy or fried foods are harder to digest and may make you feel nauseous or bloated.  Likewise, meals heavy in dairy and vegetables can increase bloating.  Drink plenty of fluids but you should avoid alcoholic beverages for 24  hours.  ACTIVITY:  You should plan to take it easy for the rest of today and you should NOT DRIVE or use heavy machinery until tomorrow (because of the sedation medicines used during the test).    FOLLOW UP: Our staff will call the number listed on your records the next business day following your procedure to check on you and address any questions or concerns that you may have regarding the information given to you following your procedure. If we do not reach you, we will leave a message.  However, if you are feeling well and you are not experiencing any problems, there is no need to return our call.  We will assume that you have returned to your regular daily activities without incident.  If any biopsies were taken you will be contacted by phone or by letter within the next 1-3 weeks.  Please call us at [phone] if you have not heard about the biopsies in 3 weeks.    SIGNATURES/CONFIDENTIALITY: You and/or your care partner have signed paperwork which will be entered into your electronic medical record.  These signatures attest to the fact that that the information above on your After Visit Summary has been reviewed and is understood.  Full responsibility of the confidentiality of this discharge information lies with you and/or your care-partner.   Resume medications. Information given on polyps,hemorrhoids and high fiber diet.

## 2014-09-06 ENCOUNTER — Telehealth: Payer: Self-pay

## 2014-09-06 NOTE — Telephone Encounter (Signed)
Elenore Rota, pt's husband, answered the phone.  Pt is still asleep.  I asked him to give her the message we called to check on her.  If any questions or concerns to please call us back. maw

## 2014-09-11 ENCOUNTER — Encounter: Payer: Self-pay | Admitting: Gastroenterology

## 2014-09-20 ENCOUNTER — Other Ambulatory Visit: Payer: Self-pay | Admitting: Family Medicine

## 2014-09-27 ENCOUNTER — Encounter (HOSPITAL_COMMUNITY): Payer: Self-pay

## 2014-09-27 ENCOUNTER — Other Ambulatory Visit (HOSPITAL_BASED_OUTPATIENT_CLINIC_OR_DEPARTMENT_OTHER): Payer: Medicare Other

## 2014-09-27 ENCOUNTER — Ambulatory Visit (HOSPITAL_COMMUNITY)
Admission: RE | Admit: 2014-09-27 | Discharge: 2014-09-27 | Disposition: A | Discharge status: Home / Self Care | Payer: Medicare Other | Source: Ambulatory Visit | Attending: Hematology and Oncology | Admitting: Hematology and Oncology

## 2014-09-27 DIAGNOSIS — R590 Localized enlarged lymph nodes: Secondary | ICD-10-CM | POA: Insufficient documentation

## 2014-09-27 DIAGNOSIS — C8190 Hodgkin lymphoma, unspecified, unspecified site: Secondary | ICD-10-CM | POA: Diagnosis not present

## 2014-09-27 LAB — CBC WITH DIFFERENTIAL/PLATELET
BASO%: 0.8 % (ref 0.0–2.0)
Basophils Absolute: 0.1 10*3/uL (ref 0.0–0.1)
EOS%: 6 % (ref 0.0–7.0)
Eosinophils Absolute: 0.4 10*3/uL (ref 0.0–0.5)
HCT: 39.2 % (ref 34.8–46.6)
HGB: 13.1 g/dL (ref 11.6–15.9)
LYMPH%: 45.5 % (ref 14.0–49.7)
MCH: 30.3 pg (ref 25.1–34.0)
MCHC: 33.4 g/dL (ref 31.5–36.0)
MCV: 90.5 fL (ref 79.5–101.0)
MONO#: 1.1 10*3/uL — ABNORMAL HIGH (ref 0.1–0.9)
MONO%: 15 % — ABNORMAL HIGH (ref 0.0–14.0)
NEUT#: 2.3 10*3/uL (ref 1.5–6.5)
NEUT%: 32.7 % — ABNORMAL LOW (ref 38.4–76.8)
Platelets: 222 10*3/uL (ref 145–400)
RBC: 4.33 10*6/uL (ref 3.70–5.45)
RDW: 14.1 % (ref 11.2–14.5)
WBC: 7.1 10*3/uL (ref 3.9–10.3)
lymph#: 3.2 10*3/uL (ref 0.9–3.3)

## 2014-09-27 LAB — COMPREHENSIVE METABOLIC PANEL (CC13)
ALT: 20 U/L (ref 0–55)
AST: 25 U/L (ref 5–34)
Albumin: 4 g/dL (ref 3.5–5.0)
Alkaline Phosphatase: 156 U/L — ABNORMAL HIGH (ref 40–150)
Anion Gap: 8 mEq/L (ref 3–11)
BUN: 13 mg/dL (ref 7.0–26.0)
CO2: 29 mEq/L (ref 22–29)
Calcium: 9.4 mg/dL (ref 8.4–10.4)
Chloride: 105 mEq/L (ref 98–109)
Creatinine: 0.8 mg/dL (ref 0.6–1.1)
EGFR: 76 mL/min/{1.73_m2} — ABNORMAL LOW (ref >90)
Glucose: 92 mg/dl (ref 70–140)
Potassium: 4.2 mEq/L (ref 3.5–5.1)
SODIUM: 142 meq/L (ref 136–145)
Total Bilirubin: 0.57 mg/dL (ref 0.20–1.20)
Total Protein: 6.3 g/dL — ABNORMAL LOW (ref 6.4–8.3)

## 2014-09-27 LAB — GLUCOSE, CAPILLARY: GLUCOSE-CAPILLARY: 92 mg/dL (ref 65–99)

## 2014-09-27 LAB — LACTATE DEHYDROGENASE (CC13): LDH: 181 U/L (ref 125–245)

## 2014-09-27 MED ORDER — FLUDEOXYGLUCOSE F - 18 (FDG) INJECTION
11.5300 | Freq: Once | INTRAVENOUS | Status: DC | PRN
  Administered 2014-09-27: 11.53 via INTRAVENOUS
  Filled 2014-09-27: qty 11.53

## 2014-09-28 ENCOUNTER — Encounter: Payer: Self-pay | Admitting: Hematology and Oncology

## 2014-09-28 ENCOUNTER — Ambulatory Visit (HOSPITAL_BASED_OUTPATIENT_CLINIC_OR_DEPARTMENT_OTHER): Payer: Medicare Other | Admitting: Hematology and Oncology

## 2014-09-28 VITALS — BP 140/56 | HR 77 | Temp 98.5°F | Resp 18 | Ht 65.0 in | Wt 233.9 lb

## 2014-09-28 DIAGNOSIS — G8929 Other chronic pain: Secondary | ICD-10-CM | POA: Diagnosis not present

## 2014-09-28 DIAGNOSIS — M545 Low back pain, unspecified: Secondary | ICD-10-CM | POA: Insufficient documentation

## 2014-09-28 DIAGNOSIS — G620 Drug-induced polyneuropathy: Secondary | ICD-10-CM | POA: Diagnosis not present

## 2014-09-28 DIAGNOSIS — C8190 Hodgkin lymphoma, unspecified, unspecified site: Secondary | ICD-10-CM | POA: Diagnosis not present

## 2014-09-28 DIAGNOSIS — T451X5A Adverse effect of antineoplastic and immunosuppressive drugs, initial encounter: Secondary | ICD-10-CM

## 2014-09-28 NOTE — Assessment & Plan Note (Signed)
She has chronic back pain. Imaging studies show sacral masses. The areas of involvement did not correlate with her pain symptoms.  the patient declined pain medicine for now.

## 2014-09-28 NOTE — Progress Notes (Signed)
[place] OFFICE PROGRESS NOTE  Patient Care Team: Abner Greenspan, MD as PCP - General Mosetta Anis, MD (Allergy) Fanny Skates, MD as Consulting Physician (General Surgery)  SUMMARY OF ONCOLOGIC HISTORY:   Hodgkin lymphoma   05/20/2013 Initial Diagnosis Hodgkin lymphoma   06/27/2014 Imaging Interval increase in metabolic activity of several small lymph nodes is concerning for lymphoma recurrence. Lymph nodes include a small right level 2 lymph node, right hilar lymph node, and right paratracheal lymph node   07/07/2014 Pathology Results  limited tissue from ultrasound-guided biopsy was nondiagnostic   07/07/2014 Procedure  ultrasound-guided biopsy was nondiagnostic   09/27/2014 Imaging Repeat PET CT scan show diffuse disease concern for relapse.    Colon cancer   06/17/2013 Initial Diagnosis Colon cancer   09/05/2014 Procedure repeat colonoscopy was negative   09/05/2014 Pathology Results Biopsy was negative   Diamond Boyd was transferred to my care after her prior physician has left.  I reviewed the patient's records extensive and collaborated the history with the patient. 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 She was treated with AVD from 06/08/2013 to 12/08/2013. PET CT scan on 12/27/2013 showed complete response to treatment. INTERVAL HISTORY: Please see below for problem oriented charting. She complained of persistent peripheral neuropathy and lower back pain. Her lower back is in the thoracolumbar region. Peripheral neuropathy is improving somewhat with gabapentin. She denies recent infection  REVIEW OF SYSTEMS:   Constitutional: Denies fevers, chills or abnormal  weight loss Eyes: Denies blurriness of vision Ears, nose, mouth, throat, and face: Denies mucositis or sore throat Respiratory: Denies cough, dyspnea or wheezes Cardiovascular: Denies palpitation, chest discomfort or lower extremity swelling Gastrointestinal:  Denies nausea, heartburn or change in bowel habits Skin: Denies abnormal skin rashes Lymphatics: Denies new lymphadenopathy or easy bruising Neurological:Denies numbness, tingling or new weaknesses Behavioral/Psych: Mood is stable, no new changes  All other systems were reviewed with the patient and are negative.  I have reviewed the past medical history, past surgical history, social history and family history with the patient and they are unchanged from previous note.  ALLERGIES:  is allergic to achromycin; allopurinol; astelin; cephalexin; codeine; meloxicam; minocycline; nabumetone; nyquil; penicillins; sulfa antibiotics; zolpidem tartrate; buspar; and ciprofloxacin.  MEDICATIONS:  Current Outpatient Prescriptions  Medication Sig Dispense Refill  . acetaminophen (TYLENOL) 500 MG tablet Take 1,000 mg by mouth every 6 (six) hours as needed for mild pain or moderate pain.    . Calcium Carbonate-Vitamin D (CALCIUM 600+D) 600-400 MG-UNIT per tablet Take 1 tablet by mouth daily.     . celecoxib (CELEBREX) 200 MG capsule Take 1 capsule (200 mg total) by mouth daily as needed for moderate pain (with food). 90 capsule 3  . cholecalciferol (VITAMIN D) 1000 UNITS tablet Take 1,000 Units by mouth daily.     Marland Kitchen EPIPEN 2-PAK 0.3 MG/0.3ML SOAJ injection as needed.    Marland Kitchen estradiol (ESTRACE) 2 MG tablet Take 0.5 tablets (1 mg total) by mouth daily. Takes 1/2 tablet 45 tablet 3  . fexofenadine (ALLEGRA) 180 MG tablet TAKE 1 TABLET (180 MG TOTAL) BY MOUTH DAILY. 90 tablet 0  . FLUoxetine (PROZAC) 20 MG capsule Take 2 capsules (40  mg total) by mouth every morning. 180 capsule 3  . fluticasone (FLONASE) 50 MCG/ACT nasal spray Place 1 spray into both   nostrils 2 (two) times  daily. 48 g 0  . gabapentin (NEURONTIN) 300 MG capsule Take 1 capsule (300 mg total) by mouth 3 (three) times daily. (Patient taking differently: Take 300 mg by mouth 2 (two) times daily. ) 90 capsule 3  . levothyroxine (SYNTHROID, LEVOTHROID) 112 MCG tablet Take 1 tablet by mouth  daily 90 tablet 0  . Omega-3 350 MG CAPS Take 1 capsule by mouth daily.    Marland Kitchen omeprazole (PRILOSEC) 40 MG capsule Take 1 capsule by mouth two times daily 180 capsule 0  . psyllium (METAMUCIL) 58.6 % packet Take 1-3 packets by mouth daily.     . simvastatin (ZOCOR) 40 MG tablet Take 1 tablet by mouth  daily 90 tablet 0  . vitamin E (VITAMIN E) 400 UNIT capsule Take 400 Units by mouth daily.      No current facility-administered medications for this visit.   Facility-Administered Medications Ordered in Other Visits  Medication Dose Route Frequency Provider Last Rate Last Dose  . fludeoxyglucose F - 18 (FDG) injection 11.53 milli Curie  11.53 milli Curie Intravenous Once PRN Medication Radiologist, MD   11.53 milli Curie at 09/27/14 1148    PHYSICAL EXAMINATION: ECOG PERFORMANCE STATUS: 0 - Asymptomatic  Filed Vitals:   09/28/14 1256  BP: 140/56  Pulse: 77  Temp: 98.5 F (36.9 C)  Resp: 18   Filed Weights   09/28/14 1256  Weight: 233 lb 14.4 oz (106.096 kg)    GENERAL:alert, no distress and comfortable. She is obese SKIN: skin color, texture, turgor are normal, no rashes or significant lesions EYES: normal, Conjunctiva are pink and non-injected, sclera clear OROPHARYNX:no exudate, no erythema and lips, buccal mucosa, and tongue normal  NECK: supple, thyroid normal size, non-tender, without nodularity LYMPH:  no palpable lymphadenopathy in the cervical, axillary or inguinal LUNGS: clear to auscultation and percussion with normal breathing effort HEART: regular rate & rhythm and no murmurs and no lower extremity edema ABDOMEN:abdomen soft, non-tender and normal bowel  sounds Musculoskeletal:no cyanosis of digits and no clubbing  NEURO: alert & oriented x 3 with fluent speech, no focal motor/sensory deficits  LABORATORY DATA:  I have reviewed the data as listed    Component Value Date/Time   NA 142 09/27/2014 1045   NA 131* 06/07/2013 2300   K 4.2 09/27/2014 1045   K 4.1 06/07/2013 2300   CL 92* 06/07/2013 2300   CL 105 07/14/2012 0939   CO2 29 09/27/2014 1045   CO2 21 06/07/2013 2300   GLUCOSE 92 09/27/2014 1045   GLUCOSE 117* 06/07/2013 2300   GLUCOSE 103* 07/14/2012 0939   BUN 13.0 09/27/2014 1045   BUN 17 06/07/2013 2300   CREATININE 0.8 09/27/2014 1045   CREATININE 0.74 06/07/2013 2300   CALCIUM 9.4 09/27/2014 1045   CALCIUM 9.3 06/07/2013 2300   CALCIUM 9.3 08/09/2009 0000   PROT 6.3* 09/27/2014 1045   PROT 5.7* 06/07/2013 2358   ALBUMIN 4.0 09/27/2014 1045   ALBUMIN 3.3* 06/07/2013 2358   AST 25 09/27/2014 1045   AST 121* 06/07/2013 2358   ALT 20 09/27/2014 1045   ALT 46* 06/07/2013 2358   ALKPHOS 156* 09/27/2014 1045   ALKPHOS 529* 06/07/2013 2358   BILITOT 0.57 09/27/2014 1045   BILITOT 2.5* 06/07/2013 2358   GFRNONAA 82* 06/07/2013 2300   GFRAA >90 06/07/2013 2300  No results found for: SPEP, UPEP  Lab Results  Component Value Date   WBC 7.1 09/27/2014   NEUTROABS 2.3 09/27/2014   HGB 13.1 09/27/2014   HCT 39.2 09/27/2014   MCV 90.5 09/27/2014   PLT 222 09/27/2014      Chemistry      Component Value Date/Time   NA 142 09/27/2014 1045   NA 131* 06/07/2013 2300   K 4.2 09/27/2014 1045   K 4.1 06/07/2013 2300   CL 92* 06/07/2013 2300   CL 105 07/14/2012 0939   CO2 29 09/27/2014 1045   CO2 21 06/07/2013 2300   BUN 13.0 09/27/2014 1045   BUN 17 06/07/2013 2300   CREATININE 0.8 09/27/2014 1045   CREATININE 0.74 06/07/2013 2300      Component Value Date/Time   CALCIUM 9.4 09/27/2014 1045   CALCIUM 9.3 06/07/2013 2300   CALCIUM 9.3 08/09/2009 0000   ALKPHOS 156* 09/27/2014 1045   ALKPHOS 529* 06/07/2013  2358   AST 25 09/27/2014 1045   AST 121* 06/07/2013 2358   ALT 20 09/27/2014 1045   ALT 46* 06/07/2013 2358   BILITOT 0.57 09/27/2014 1045   BILITOT 2.5* 06/07/2013 2358       RADIOGRAPHIC STUDIES: I reviewed the imaging study with the patient and her husband I have personally reviewed the radiological images as listed and agreed with the findings in the report. Nm Pet Image Restag (ps) Skull Base To Thigh  09/27/2014   CLINICAL DATA:  Subsequent treatment strategy for Hodgkin's lymphoma.  EXAM: NUCLEAR MEDICINE PET SKULL BASE TO THIGH  TECHNIQUE: 11.5 mCi F-18 FDG was injected intravenously. Full-ring PET imaging was performed from the skull base to thigh after the radiotracer. CT data was obtained and used for attenuation correction and anatomic localization.  FASTING BLOOD GLUCOSE:  Value: 92 mg/dl  COMPARISON:  PET-CT 06/27/2014  FINDINGS: NECK  New bilateral hypermetabolic nodules within the parotid glands. Nodules measure 10 mm each and have intense metabolic activity. Again demonstrated diffuse metabolic activity in the thyroid gland suggesting thyroiditis.  CHEST  There is new hypermetabolic LEFT lower paratracheal and perihilar adenopathy with intense metabolic activity (SUV max 17.2). Interval increase in activity of previous described RIGHT hilar lymph node with SUV max equal 9.9 increase dfrom 8.0. No suspicious pulmonary nodules.  ABDOMEN/PELVIS  No abnormal hypermetabolic activity within the liver, pancreas, adrenal glands, or spleen. No hypermetabolic lymph nodes in the abdomen or pelvis.  SKELETON  Two hypermetabolic foci within the RIGHT sacral ala of with intense metabolic activity (SUV max 13.9). Subtle metabolic activity described at RIGHT sacral location on comparison PET-CT scan. Additional hypermetabolic foci within the inferior LEFT pubic ramus adjacent the acetabulum with SUV max equal 14. No clear CT correlation to these lesions.  IMPRESSION: 1. New LEFT hilar and mediastinal  metabolic adenopathy and increased metabolic activity of RIGHT hilar lymph nodes consistent with high-grade lymphoma recurrence. 2. New / increased metabolic activity in the RIGHT sacrum and LEFT ischium consistent with skeletal metastasis. 3. New bilateral hypermetabolic nodule in the parotid glands suggests lymphoma involvement.   Electronically Signed   By: Suzy Bouchard M.D.   On: 09/27/2014 14:55     ASSESSMENT & PLAN:  Hodgkin lymphoma I had a long discussion with the patient and her husband. I also reviewed the imaging study with the radiologist. Of all the places for biopsy, the radiologist felt that the bronchoscopy and lymph node biopsy of mediastinal LN would be most helpful to establish diagnosis.  I suspect she has recurrence of lymphoma, likely Hodgkin. However, higher grade lymphoma such as diffuse large B cell lymphoma cannot be excluded. I discussed with the patient the rationale for repeat biopsy and she agreed to proceed. The patient is inquiring about treatment options that might not cause peripheral neuropathy. I told her, recently, Nivolumab has been approved for treatment for relapsed Hodgkin lymphoma. I gave her patient education handout to read regarding side effects of Nivolumab. I will see her back after bronchoscopy and biopsy is performed so that we can review test results and to discuss further treatment options.  Peripheral neuropathy due to chemotherapy I recommend a trial of gabapentin. She felt that gabapentin provided some relief. We will continue the same.   Chronic lower back pain  She has chronic back pain. Imaging studies show sacral masses. The areas of involvement did not correlate with her pain symptoms.  the patient declined pain medicine for now.   Orders Placed This Encounter  Procedures  . Ambulatory referral to Pulmonology    Referral Priority:  Urgent    Referral Type:  Consultation    Referral Reason:  Specialty Services Required     Referred to Provider:  Javier Glazier, MD    Requested Specialty:  Pulmonary Disease    Number of Visits Requested:  1   All questions were answered. The patient knows to call the clinic with any problems, questions or concerns. No barriers to learning was detected. I spent 25 minutes counseling the patient face to face. The total time spent in the appointment was 40 minutes and more than 50% was on counseling and review of test results     [facility], [place], MD 09/28/2014 3:51 PM

## 2014-09-28 NOTE — Assessment & Plan Note (Signed)
I recommend a trial of gabapentin. She felt that gabapentin provided some relief. We will continue the same.

## 2014-09-28 NOTE — Assessment & Plan Note (Signed)
I had a long discussion with the patient and her husband. I also reviewed the imaging study with the radiologist. Of all the places for biopsy, the radiologist felt that the bronchoscopy and lymph node biopsy of mediastinal LN would be most helpful to establish diagnosis. I suspect she has recurrence of lymphoma, likely Hodgkin. However, higher grade lymphoma such as diffuse large B cell lymphoma cannot be excluded. I discussed with the patient the rationale for repeat biopsy and she agreed to proceed. The patient is inquiring about treatment options that might not cause peripheral neuropathy. I told her, recently, Nivolumab has been approved for treatment for relapsed Hodgkin lymphoma. I gave her patient education handout to read regarding side effects of Nivolumab. I will see her back after bronchoscopy and biopsy is performed so that we can review test results and to discuss further treatment options.

## 2014-09-29 ENCOUNTER — Telehealth: Payer: Self-pay | Admitting: Hematology and Oncology

## 2014-09-29 NOTE — Telephone Encounter (Signed)
S.w. Pt husband and advised on Dr. Ashok Cordia appt on 9.9 at 3:30pm

## 2014-10-04 ENCOUNTER — Telehealth: Payer: Self-pay | Admitting: Pulmonary Disease

## 2014-10-04 ENCOUNTER — Encounter: Payer: Self-pay | Admitting: Pulmonary Disease

## 2014-10-04 ENCOUNTER — Ambulatory Visit (INDEPENDENT_AMBULATORY_CARE_PROVIDER_SITE_OTHER): Payer: Medicare Other | Admitting: Pulmonary Disease

## 2014-10-04 ENCOUNTER — Other Ambulatory Visit (INDEPENDENT_AMBULATORY_CARE_PROVIDER_SITE_OTHER): Payer: Medicare Other

## 2014-10-04 VITALS — BP 124/68 | HR 76 | Ht 66.0 in | Wt 237.4 lb

## 2014-10-04 DIAGNOSIS — R599 Enlarged lymph nodes, unspecified: Secondary | ICD-10-CM | POA: Diagnosis not present

## 2014-10-04 DIAGNOSIS — Z01812 Encounter for preprocedural laboratory examination: Secondary | ICD-10-CM

## 2014-10-04 DIAGNOSIS — Z8571 Personal history of Hodgkin lymphoma: Secondary | ICD-10-CM

## 2014-10-04 DIAGNOSIS — Z862 Personal history of diseases of the blood and blood-forming organs and certain disorders involving the immune mechanism: Secondary | ICD-10-CM

## 2014-10-04 DIAGNOSIS — R591 Generalized enlarged lymph nodes: Secondary | ICD-10-CM

## 2014-10-04 DIAGNOSIS — R590 Localized enlarged lymph nodes: Secondary | ICD-10-CM

## 2014-10-04 LAB — PROTIME-INR
INR: 1 ratio (ref 0.8–1.0)
PROTHROMBIN TIME: 10.9 s (ref 9.6–13.1)

## 2014-10-04 NOTE — Progress Notes (Signed)
Subjective:    Patient ID: Diamond Boyd, female    DOB: 1941-02-10, 73 y.o.   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 She has a known history of Hodgkin lymphoma diagnosed 05/20/2013 that was stage IV with bone marrow involvement treated with AVD from 05/2013 to 11/2013. She reports she has significant fatigue. She reports dyspnea on exertion. Denies any coughing. She reports a "deep" pain in her anterior sternum that "comes & goes".  It only lasts for a few seconds. She reports the pain occurs at rest without any inciting factors. Denies any fever, chills, or sweats. She is gaining weight. Has a good appetite. She reports she feels like her cervical lymph nodes seem to be enlarging. No dysphagia or odynophagia. No nausea, emesis, abdominal pain, or diarrhea. No melena or hematochezia. No rashes but does bruise easily.   Review of Systems No dysuria or hematuria. Reports chronic back pain particularly in her lower back. She reports bone pain in her bilateral upper arms. No syncope or near syncope. A pertinent 14 point review of systems is negative except as per the history of presenting illness.  Allergies  Allergen Reactions  . Achromycin [Tetracycline Hcl]     Pt does not remember reaction  . Allopurinol     REACTION: Unsure of reaction happene years ago  . Astelin [Azelastine Hcl]     Unknown  . Cephalexin     REACTION: unsure of reaction happened yrs ago.  . Codeine Other (See Comments)    REACTION: abd. pain  . Meloxicam Other (See Comments)    REACTION: GI symptoms  . Minocycline Other (See Comments)    Abdominal pain  . Nabumetone     REACTION: reaction not known  . Nyquil [Pseudoeph-Doxylamine-Dm-Apap] Hives  . Penicillins     REACTION: reaction not known  . Sulfa Antibiotics     Gi side eff   . Zolpidem Tartrate       REACTION: feels too drugged  . Buspar [Buspirone Hcl]     Dizziness, and not as effective for anxiety  . Ciprofloxacin Rash   Current Outpatient Prescriptions on File Prior to Visit  Medication Sig Dispense Refill  . acetaminophen (TYLENOL) 500 MG tablet Take 1,000 mg by mouth every 6 (six) hours as needed for mild pain or moderate pain.    . Calcium Carbonate-Vitamin D (CALCIUM 600+D) 600-400 MG-UNIT per tablet Take 1 tablet by mouth daily.     . celecoxib (CELEBREX) 200 MG capsule Take 1 capsule (200 mg total) by mouth daily as needed for moderate pain (with food). 90 capsule 3  . cholecalciferol (VITAMIN D) 1000 UNITS tablet Take 1,000 Units by mouth daily.     Marland Kitchen EPIPEN 2-PAK 0.3 MG/0.3ML SOAJ injection as needed.    Marland Kitchen estradiol (ESTRACE) 2 MG tablet Take 0.5 tablets (1 mg total) by mouth daily. Takes 1/2 tablet 45 tablet 3  . fexofenadine (ALLEGRA) 180 MG tablet TAKE 1 TABLET (180 MG TOTAL) BY MOUTH DAILY. 90 tablet 0  . FLUoxetine (PROZAC) 20 MG capsule Take 2 capsules (40 mg total) by mouth every morning. 180 capsule 3  . fluticasone (FLONASE) 50 MCG/ACT nasal spray Place 1 spray into both  nostrils 2 (two) times  daily. 48 g 0  . gabapentin (NEURONTIN) 300 MG capsule Take 1 capsule (300 mg total)  by mouth 3 (three) times daily. (Patient taking differently: Take 300 mg by mouth 2 (two) times daily. ) 90 capsule 3  . levothyroxine (SYNTHROID, LEVOTHROID) 112 MCG tablet Take 1 tablet by mouth  daily 90 tablet 0  . Omega-3 350 MG CAPS Take 1 capsule by mouth daily.    Marland Kitchen omeprazole (PRILOSEC) 40 MG capsule Take 1 capsule by mouth two times daily 180 capsule 0  . psyllium (METAMUCIL) 58.6 % packet Take 1-3 packets by mouth daily.     . simvastatin (ZOCOR) 40 MG tablet Take 1 tablet by mouth  daily 90 tablet 0  . vitamin E (VITAMIN E) 400 UNIT capsule Take 400 Units by mouth daily.      No current facility-administered medications on file prior to visit.   Past Medical History   Diagnosis Date  . Colon polyps   . Depression   . Hypothyroidism   . Osteoarthritis     hands  . Peripheral vascular disease   . Degenerative disk disease     spine in center in past   . 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 Cancer     skin cancer- basal cell on arm / colon 2011  . Lymphoma   . Colon cancer     08-2009  . 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 Cancer     colon CA and lung CA   Past Surgical History  Procedure Laterality Date  . Cholecystectomy    . Splenectomy      lymphoma  . Breast biopsy  1996  . Plantar fascia release    . Ventral hernia repair  1998  . Tendon release      Right thumb   . Colectomy  8/11  . Vaginal hysterectomy    . Bone marrow biopsy  05/27/13  . Cataract extraction, bilateral  2016  . Port-a-cath insertion    . Colonoscopy w/ biopsies    . Dg biopsy lung     Family History  Problem Relation Age of Onset  . Coronary artery disease Mother   . Alcohol abuse Mother   . Diabetes Mother   . Cancer Father     brain  . Diabetes Brother   . Fibromyalgia Daughter     chronic pain   . COPD Daughter   . Colon cancer Neg Hx   . Colon polyps Neg Hx   . Stomach cancer Neg Hx   . Rectal cancer Neg Hx   . Ulcerative colitis Neg Hx   . Crohn's disease Neg Hx   . Asthma Daughter   . Rheum arthritis Brother   . Clotting disorder Brother    Social History   Social History  . Marital Status: Married    Spouse Name: N/A  . Number of Children: 2  . Years of Education: N/A   Occupational History  . Retired     Social History Main Topics  . Smoking status: Never Smoker   . Smokeless tobacco: Never Used     Comment: Second-hand exposure through father.  . Alcohol Use: No  . Drug Use: No  . Sexual Activity: Not Asked   Other Topics Concern  . None   Social History Narrative   Originally from [place]. Always lived in [place]. Previously has traveled to [place] [facility], [place] & up [employer] to [place]. No international travel. No pets  currently. Remote parakeet exposure with her children. 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  Objective:   Physical Exam Blood pressure 124/68, pulse 76, height 5' 6"  (1.676 m), weight 237 lb 6.4 oz (107.684 kg), last menstrual period 02/10/1970, SpO2 98 %. General:  Awake. Alert. No acute distress. Accompanied by husband today. Morbidly obese. ASA Class I. Integument:  Warm & dry. No rash on exposed skin. No bruising. Lymphatics:  No appreciated cervical or supraclavicular lymphadenoapthy. HEENT:  Moist mucus membranes. No oral ulcers. No scleral injection or icterus. Mallampati Class I. Cardiovascular:  Regular rate. Trace lower extremity edema. No appreciable JVD.  Pulmonary:  Good aeration & clear to auscultation bilaterally. Symmetric chest wall expansion. No accessory muscle use. Abdomen: Soft. Normal bowel sounds. Protuberant. Grossly nontender. Musculoskeletal:  Normal bulk and tone. Hand grip strength 5/5 bilaterally. No joint deformity or effusion appreciated. Neurological:  CN 2-12 grossly in tact. No meningismus. Moving all 4 extremities equally. Symmetric patellar deep tendon reflexes. Psychiatric:  Mood and affect congruent. Speech normal rhythm, rate & tone.   IMAGING PET CT 09/27/14 (personally reviewed by me): Hypermetabolic bilateral hilar lymph nodes as well as hypermetabolic pretracheal lymph node. 10 mm nodules noted within parotid glands with intense metabolic activity as well as diffuse metabolic activity in the thyroid suggesting thyroiditis. No suspicious pulmonary nodules or masses. No pleural effusion or thickening. No pericardial effusion. No hypermetabolic areas within the abdominal or pelvic lymph nodes. There are 2 hypermetabolic foci in the right sacral ala. Additional hypermetabolic foci in the left pubic ramus. Radiology commented that there is no clear CT correlation to these lesions.  PATHOLOGY COLON POLYP (09/05/14): Hyperplastic  polyp with benign mucosa with lymphoid aggregates.  FLOW CYTOMETRY (07/07/14): Insufficient cellular material for analysis.  RIGHT SUBMANDIBULAR NODE (07/07/14): Limited lymphoid tissue. Suboptimal for evaluation.  BONE MARROW BIOPSY (05/26/13): & Clear bone marrow with atypical lymphohistiocytic proliferation with features similar to previously diagnosed classical Hodgkin's lymphoma.  RIGHT LUNG PERCUTANEOUS Bx (05/03/13): Neoplasm most consistent with classical Hodgkin lymphoma.  LABS 09/27/14 CBC: 7.1/13.1/39.2/222 BMP: 142/4.2/105/29/13/0.8/92/9.4 LFT: 4.0/6.3/0.57/156/25/20 LDH: 181    Assessment & Plan:  74 year old female with history of colon cancer and Hodgkin's lymphoma presenting with progression of mediastinal and hilar lymphadenopathy that his PET/CT scan positive. This is concerning for recurrence of her Hodgkin's lymphoma. I reviewed the PET/CT imaging with the patient and her husband during the exam today. I feel it's reasonable to proceed with an endobronchial ultrasound-guided fine-needle aspiration of either her precarinal lymph node or left hilar lymph node. This will be sent for flow cytometry as well as evaluation by cytopathology. I discussed the risks of the procedure including medication allergy, bleeding, infection, pneumothorax, vocal cord injury, and potentially death. The patient is willing to undergo the procedure.  1. Mediastinal/hilar lymphadenopathy: Likely recurrence of Hodgkin's lymphoma. Plan for bronchoscopy with endobronchial ultrasound exam and fine-needle aspiration of mediastinal/hilar lymphadenopathy. This will be sent for flow cytometry. Checking PT/INR today. 2. History of Hodgkin's lymphoma: Concerning for recurrence based on recent PET/CT imaging. Follows with Dr. Alvy Bimler. 3. Follow-up: Patient will return to clinic in 2-4 weeks.

## 2014-10-04 NOTE — Telephone Encounter (Signed)
IMAGING PET CT 09/27/14 (personally reviewed by me): Hypermetabolic bilateral hilar lymph nodes as well as hypermetabolic pretracheal lymph node. 10 mm nodules noted within parotid glands with intense metabolic activity as well as diffuse metabolic activity in the thyroid suggesting thyroiditis. No suspicious pulmonary nodules or masses. No pleural effusion or thickening. No pericardial effusion. No hypermetabolic areas within the abdominal or pelvic lymph nodes. There are 2 hypermetabolic foci in the right sacral ala. Additional hypermetabolic foci in the left pubic ramus. Radiology commented that there is no clear CT correlation to these lesions.  PATHOLOGY COLON POLYP (09/05/14): Hyperplastic polyp with benign mucosa with lymphoid aggregates.  FLOW CYTOMETRY (07/07/14): Insufficient cellular material for analysis.  RIGHT SUBMANDIBULAR NODE (07/07/14): Limited lymphoid tissue. Suboptimal for evaluation.  BONE MARROW BIOPSY (05/26/13): & Clear bone marrow with atypical lymphohistiocytic proliferation with features similar to previously diagnosed classical Hodgkin's lymphoma.  RIGHT LUNG PERCUTANEOUS Bx (05/03/13): Neoplasm most consistent with classical Hodgkin lymphoma.  LABS 09/27/14 CBC: 7.1/13.1/39.2/222 BMP: 142/4.2/105/29/13/0.8/92/9.4 LFT: 4.0/6.3/0.57/156/25/20 LDH: 181

## 2014-10-04 NOTE — Patient Instructions (Signed)
1. We are scheduling you for a bronchoscopy and biopsy in the next couple of weeks. 2. Please remember to stop taking your vitamin E today because he can't then your blood. 3. Please stop taking her Celebrex 2 days prior to your procedure. 4. Remember that she should not eat or drink anything 6 hours prior to procedure. You can take her medicines with a sip of water. 5. I will see back in clinic in 2-4 weeks but please call me if you have any questions prior to procedure.

## 2014-10-05 ENCOUNTER — Institutional Professional Consult (permissible substitution): Payer: Medicare Other | Admitting: Pulmonary Disease

## 2014-10-06 ENCOUNTER — Telehealth: Payer: Self-pay | Admitting: Pulmonary Disease

## 2014-10-06 NOTE — Telephone Encounter (Signed)
Spoke with Diamond Boyd at [facility], states that pt is having a video bronch on 9/6 and a preop on 8/31, states that orders for the bronch need to be put in by 8/31 so they will know what labs need to be drawn for preop.  Dr. Ashok Cordia please advise on which labs need to be ordered.  Thanks!

## 2014-10-06 NOTE — Telephone Encounter (Signed)
Thank you, Dr. Ashok Cordia.  Nothing further needed.

## 2014-10-06 NOTE — Telephone Encounter (Signed)
Order is in and all is taken care of.

## 2014-10-11 ENCOUNTER — Ambulatory Visit (HOSPITAL_BASED_OUTPATIENT_CLINIC_OR_DEPARTMENT_OTHER): Payer: Medicare Other

## 2014-10-11 ENCOUNTER — Encounter (HOSPITAL_COMMUNITY)
Admission: RE | Admit: 2014-10-11 | Discharge: 2014-10-11 | Disposition: A | Discharge status: Home / Self Care | Payer: Medicare Other | Source: Ambulatory Visit | Attending: Pulmonary Disease | Admitting: Pulmonary Disease

## 2014-10-11 ENCOUNTER — Encounter (HOSPITAL_COMMUNITY): Payer: Self-pay

## 2014-10-11 DIAGNOSIS — C8190 Hodgkin lymphoma, unspecified, unspecified site: Secondary | ICD-10-CM

## 2014-10-11 DIAGNOSIS — R918 Other nonspecific abnormal finding of lung field: Secondary | ICD-10-CM | POA: Diagnosis not present

## 2014-10-11 DIAGNOSIS — Z452 Encounter for adjustment and management of vascular access device: Secondary | ICD-10-CM | POA: Diagnosis present

## 2014-10-11 DIAGNOSIS — Z95828 Presence of other vascular implants and grafts: Secondary | ICD-10-CM

## 2014-10-11 DIAGNOSIS — Z01812 Encounter for preprocedural laboratory examination: Secondary | ICD-10-CM | POA: Diagnosis not present

## 2014-10-11 HISTORY — DX: Anemia, unspecified: D64.9

## 2014-10-11 HISTORY — DX: Gastro-esophageal reflux disease without esophagitis: K21.9

## 2014-10-11 HISTORY — DX: Cardiac murmur, unspecified: R01.1

## 2014-10-11 LAB — BASIC METABOLIC PANEL
ANION GAP: 6 (ref 5–15)
BUN: 7 mg/dL (ref 6–20)
CO2: 28 mmol/L (ref 22–32)
Calcium: 9.4 mg/dL (ref 8.9–10.3)
Chloride: 105 mmol/L (ref 101–111)
Creatinine, Ser: 0.74 mg/dL (ref 0.44–1.00)
Glucose, Bld: 133 mg/dL — ABNORMAL HIGH (ref 65–99)
POTASSIUM: 4 mmol/L (ref 3.5–5.1)
SODIUM: 139 mmol/L (ref 135–145)

## 2014-10-11 LAB — CBC
HCT: 38.1 % (ref 36.0–46.0)
HEMOGLOBIN: 12.6 g/dL (ref 12.0–15.0)
MCH: 30.1 pg (ref 26.0–34.0)
MCHC: 33.1 g/dL (ref 30.0–36.0)
MCV: 91.1 fL (ref 78.0–100.0)
PLATELETS: 217 10*3/uL (ref 150–400)
RBC: 4.18 MIL/uL (ref 3.87–5.11)
RDW: 14.6 % (ref 11.5–15.5)
WBC: 8.6 10*3/uL (ref 4.0–10.5)

## 2014-10-11 MED ORDER — SODIUM CHLORIDE 0.9 % IJ SOLN
10.0000 mL | INTRAMUSCULAR | Status: DC | PRN
  Administered 2014-10-11: 10 mL via INTRAVENOUS
  Filled 2014-10-11: qty 10

## 2014-10-11 MED ORDER — HEPARIN SOD (PORK) LOCK FLUSH 100 UNIT/ML IV SOLN
500.0000 [IU] | Freq: Once | INTRAVENOUS | Status: AC
  Administered 2014-10-11: 500 [IU] via INTRAVENOUS
  Filled 2014-10-11: qty 5

## 2014-10-11 NOTE — Patient Instructions (Signed)

## 2014-10-11 NOTE — Pre-Procedure Instructions (Signed)
Diamond Boyd  10/11/2014      [id number] [address]Phone: [phone]: [phone] [place] [address] [id number]: [phone]: [phone]   Your procedure is scheduled on Tuesday 10/17/14  [facility]  Call this number if you have problems the morning of surgery:  [phone]   Remember:  Do not eat food or drink liquids after midnight.  Take these medicines the morning of surgery with A SIP OF WATER  TYLENOL IF NEEDED, ESTRADIOL, FLUOXETINE(PROZAC), GABAPENTIN(NEURONTIN), LEVOTHYROXINE (SYNTHROID), OMEPRAZOLE (PRILOSEC)    (STOP VIT E, OMEGA 3 FISH OIL)   Do not wear jewelry, make-up or nail polish.  Do not wear lotions, powders, or perfumes.  You may wear deodorant.  Do not shave 48 hours prior to surgery.  Men may shave face and neck.  Do not bring valuables to the hospital.  [facility] or [facility].  Contacts, dentures or bridgework may not be worn into surgery.  Leave your suitcase in the car.  After surgery it may be brought to your room.  For patients admitted to the hospital, discharge time will be determined by your treatment team.  Patients discharged the day of surgery will not be allowed to drive home.   Name and phone number of your driver:    Special instructions:  [facility]  Before surgery, you can play an important role.  Because skin is not sterile, your skin needs to be as free of germs as possible.  You can reduce the number of germs on you skin by washing with CHG (chlorahexidine gluconate) soap before surgery.  CHG is an antiseptic cleaner which kills germs and bonds with the skin to continue killing germs even after washing.  Please DO NOT use if you have an allergy  to CHG or antibacterial soaps.  If your skin becomes reddened/irritated stop using the CHG and inform your nurse when you arrive at Short Stay.  Do not shave (including legs and underarms) for at least 48 hours prior to the first CHG shower.  You may shave your face.  Please follow these instructions carefully:   1.  Shower with CHG Soap the night before surgery and the                                morning of Surgery.  2.  If you choose to wash your hair, wash your hair first as usual with your       normal shampoo.  3.  After you shampoo, rinse your hair and body thoroughly to remove the                      Shampoo.  4.  Use CHG as you would any other liquid soap.  You can apply chg directly       to the skin and wash gently with scrungie or a clean washcloth.  5.  Apply the CHG Soap to your body ONLY FROM THE NECK DOWN.        Do not use on open wounds or open sores.  Avoid contact with your eyes,  ears, mouth and genitals (private parts).  Wash genitals (private parts)       with your normal soap.  6.  Wash thoroughly, paying special attention to the area where your surgery        will be performed.  7.  Thoroughly rinse your body with warm water from the neck down.  8.  DO NOT shower/wash with your normal soap after using and rinsing off       the CHG Soap.  9.  Pat yourself dry with a clean towel.            10.  Wear clean pajamas.            11.  Place clean sheets on your bed the night of your first shower and do not        sleep with pets.  Day of Surgery  Do not apply any lotions/deoderants the morning of surgery.  Please wear clean clothes to the hospital/surgery center.    Please read over the following fact sheets that you were given. Pain Booklet, Coughing and Deep Breathing and Surgical Site Infection Prevention

## 2014-10-17 ENCOUNTER — Ambulatory Visit (HOSPITAL_COMMUNITY): Payer: Medicare Other | Admitting: Anesthesiology

## 2014-10-17 ENCOUNTER — Encounter (HOSPITAL_COMMUNITY): Payer: Self-pay | Admitting: *Deleted

## 2014-10-17 ENCOUNTER — Encounter (HOSPITAL_COMMUNITY)
Admission: RE | Disposition: A | Discharge status: Home / Self Care | Payer: Self-pay | Source: Ambulatory Visit | Attending: Pulmonary Disease

## 2014-10-17 ENCOUNTER — Ambulatory Visit (HOSPITAL_COMMUNITY): Payer: Medicare Other

## 2014-10-17 ENCOUNTER — Ambulatory Visit (HOSPITAL_COMMUNITY)
Admission: RE | Admit: 2014-10-17 | Discharge: 2014-10-17 | Disposition: A | Discharge status: Home / Self Care | Payer: Medicare Other | Source: Ambulatory Visit | Attending: Pulmonary Disease | Admitting: Pulmonary Disease

## 2014-10-17 DIAGNOSIS — Z85118 Personal history of other malignant neoplasm of bronchus and lung: Secondary | ICD-10-CM | POA: Diagnosis not present

## 2014-10-17 DIAGNOSIS — Z85828 Personal history of other malignant neoplasm of skin: Secondary | ICD-10-CM | POA: Diagnosis not present

## 2014-10-17 DIAGNOSIS — Z8571 Personal history of Hodgkin lymphoma: Secondary | ICD-10-CM | POA: Insufficient documentation

## 2014-10-17 DIAGNOSIS — E039 Hypothyroidism, unspecified: Secondary | ICD-10-CM | POA: Diagnosis not present

## 2014-10-17 DIAGNOSIS — R591 Generalized enlarged lymph nodes: Secondary | ICD-10-CM | POA: Diagnosis not present

## 2014-10-17 DIAGNOSIS — Z9889 Other specified postprocedural states: Secondary | ICD-10-CM | POA: Diagnosis not present

## 2014-10-17 DIAGNOSIS — R599 Enlarged lymph nodes, unspecified: Secondary | ICD-10-CM

## 2014-10-17 DIAGNOSIS — R590 Localized enlarged lymph nodes: Secondary | ICD-10-CM | POA: Insufficient documentation

## 2014-10-17 DIAGNOSIS — Z85038 Personal history of other malignant neoplasm of large intestine: Secondary | ICD-10-CM | POA: Diagnosis not present

## 2014-10-17 DIAGNOSIS — K219 Gastro-esophageal reflux disease without esophagitis: Secondary | ICD-10-CM | POA: Diagnosis not present

## 2014-10-17 DIAGNOSIS — I739 Peripheral vascular disease, unspecified: Secondary | ICD-10-CM | POA: Diagnosis not present

## 2014-10-17 DIAGNOSIS — D649 Anemia, unspecified: Secondary | ICD-10-CM | POA: Diagnosis not present

## 2014-10-17 HISTORY — PX: VIDEO BRONCHOSCOPY WITH ENDOBRONCHIAL ULTRASOUND: SHX6177

## 2014-10-17 SURGERY — BRONCHOSCOPY, WITH EBUS
Anesthesia: Monitor Anesthesia Care

## 2014-10-17 MED ORDER — SODIUM CHLORIDE 0.9 % IJ SOLN
INTRAMUSCULAR | Status: AC
Start: 2014-10-17 — End: 2014-10-17
  Filled 2014-10-17: qty 10

## 2014-10-17 MED ORDER — MIDAZOLAM HCL 5 MG/5ML IJ SOLN
INTRAMUSCULAR | Status: DC | PRN
  Administered 2014-10-17: .5 mg via INTRAVENOUS
  Administered 2014-10-17: 1.5 mg via INTRAVENOUS
  Administered 2014-10-17: .5 mg via INTRAVENOUS
  Administered 2014-10-17: 1 mg via INTRAVENOUS
  Administered 2014-10-17: .5 mg via INTRAVENOUS
  Administered 2014-10-17: 1 mg via INTRAVENOUS

## 2014-10-17 MED ORDER — PROPOFOL 10 MG/ML IV BOLUS
INTRAVENOUS | Status: AC
  Filled 2014-10-17: qty 20

## 2014-10-17 MED ORDER — ONDANSETRON HCL 4 MG/2ML IJ SOLN
INTRAMUSCULAR | Status: AC
  Filled 2014-10-17: qty 2

## 2014-10-17 MED ORDER — MIDAZOLAM HCL 2 MG/2ML IJ SOLN: 0.5000 mg | Freq: Once | INTRAMUSCULAR | Status: DC | PRN

## 2014-10-17 MED ORDER — LACTATED RINGERS IV SOLN
INTRAVENOUS | Status: DC | PRN
  Administered 2014-10-17: 07:00:00 via INTRAVENOUS

## 2014-10-17 MED ORDER — FENTANYL CITRATE (PF) 100 MCG/2ML IJ SOLN
INTRAMUSCULAR | Status: DC | PRN
  Administered 2014-10-17: 50 ug via INTRAVENOUS
  Administered 2014-10-17: 75 ug via INTRAVENOUS
  Administered 2014-10-17 (×5): 50 ug via INTRAVENOUS
  Administered 2014-10-17: 25 ug via INTRAVENOUS

## 2014-10-17 MED ORDER — BUTAMBEN-TETRACAINE-BENZOCAINE 2-2-14 % EX AERO
1.0000 | INHALATION_SPRAY | CUTANEOUS | Status: DC
  Filled 2014-10-17: qty 20

## 2014-10-17 MED ORDER — 0.9 % SODIUM CHLORIDE (POUR BTL) OPTIME
TOPICAL | Status: DC | PRN
  Administered 2014-10-17: 1000 mL

## 2014-10-17 MED ORDER — NEOSTIGMINE METHYLSULFATE 10 MG/10ML IV SOLN
INTRAVENOUS | Status: AC
  Filled 2014-10-17: qty 1

## 2014-10-17 MED ORDER — ROCURONIUM BROMIDE 50 MG/5ML IV SOLN
INTRAVENOUS | Status: AC
  Filled 2014-10-17: qty 1

## 2014-10-17 MED ORDER — MIDAZOLAM HCL 2 MG/2ML IJ SOLN
INTRAMUSCULAR | Status: AC
  Filled 2014-10-17: qty 4

## 2014-10-17 MED ORDER — LIDOCAINE HCL 1 % IJ SOLN
INTRAMUSCULAR | Status: DC | PRN
  Administered 2014-10-17: 30 mL

## 2014-10-17 MED ORDER — GLYCOPYRROLATE 0.2 MG/ML IJ SOLN
INTRAMUSCULAR | Status: AC
  Filled 2014-10-17: qty 1

## 2014-10-17 MED ORDER — LIDOCAINE HCL (CARDIAC) 20 MG/ML IV SOLN
INTRAVENOUS | Status: AC
  Filled 2014-10-17: qty 5

## 2014-10-17 MED ORDER — GLYCOPYRROLATE 0.2 MG/ML IJ SOLN
INTRAMUSCULAR | Status: AC
  Filled 2014-10-17: qty 2

## 2014-10-17 MED ORDER — SUCCINYLCHOLINE CHLORIDE 20 MG/ML IJ SOLN
INTRAMUSCULAR | Status: AC
  Filled 2014-10-17: qty 1

## 2014-10-17 MED ORDER — FENTANYL CITRATE (PF) 250 MCG/5ML IJ SOLN
INTRAMUSCULAR | Status: AC
  Filled 2014-10-17: qty 5

## 2014-10-17 MED ORDER — PROMETHAZINE HCL 25 MG/ML IJ SOLN
6.2500 mg | INTRAMUSCULAR | Status: DC | PRN
Start: 2014-10-17 — End: 2014-10-17

## 2014-10-17 MED ORDER — FENTANYL CITRATE (PF) 100 MCG/2ML IJ SOLN: 25.0000 ug | INTRAMUSCULAR | Status: DC | PRN

## 2014-10-17 MED ORDER — MEPERIDINE HCL 25 MG/ML IJ SOLN: 6.2500 mg | INTRAMUSCULAR | Status: DC | PRN

## 2014-10-17 MED ORDER — EPHEDRINE SULFATE 50 MG/ML IJ SOLN
INTRAMUSCULAR | Status: AC
  Filled 2014-10-17: qty 1

## 2014-10-17 MED ORDER — SODIUM CHLORIDE 0.9 % IV SOLN: Freq: Once | INTRAVENOUS | Status: DC

## 2014-10-17 SURGICAL SUPPLY — 33 items
BRUSH CYTOL CELLEBRITY 1.5X140 (MISCELLANEOUS) IMPLANT
CANISTER SUCTION 2500CC (MISCELLANEOUS) ×2 IMPLANT
CANNULA VESSEL 3MM 2 BLNT TIP (CANNULA) ×6 IMPLANT
CONT SPEC 4OZ CLIKSEAL STRL BL (MISCELLANEOUS) ×2 IMPLANT
COVER TABLE BACK 60X90 (DRAPES) ×2 IMPLANT
FORCEPS BIOP RJ4 1.8 (CUTTING FORCEPS) IMPLANT
GAUZE SPONGE 4X4 12PLY STRL (GAUZE/BANDAGES/DRESSINGS) IMPLANT
GLOVE BIOGEL M STRL SZ7.5 (GLOVE) ×2 IMPLANT
GLOVE SURG SS PI 7.0 STRL IVOR (GLOVE) ×1 IMPLANT
GOWN STRL REUS W/ TWL LRG LVL3 (GOWN DISPOSABLE) ×1 IMPLANT
GOWN STRL REUS W/ TWL XL LVL3 (GOWN DISPOSABLE) IMPLANT
GOWN STRL REUS W/TWL LRG LVL3 (GOWN DISPOSABLE) ×2
GOWN STRL REUS W/TWL XL LVL3 (GOWN DISPOSABLE) ×2
KIT CLEAN ENDO COMPLIANCE (KITS) ×2 IMPLANT
KIT ROOM TURNOVER OR (KITS) ×2 IMPLANT
MARKER SKIN DUAL TIP RULER LAB (MISCELLANEOUS) ×2 IMPLANT
NDL BIOPSY TRANSBRONCH 21G (NEEDLE) IMPLANT
NEEDLE BIOPSY TRANSBRONCH 21G (NEEDLE) IMPLANT
NEEDLE ECHOTIP HI DEF 22GA (NEEDLE) ×2 IMPLANT
NEEDLE SONO TIP II EBUS (NEEDLE) IMPLANT
NS IRRIG 1000ML POUR BTL (IV SOLUTION) ×2 IMPLANT
OIL SILICONE PENTAX (PARTS (SERVICE/REPAIRS)) ×1 IMPLANT
PAD ARMBOARD 7.5X6 YLW CONV (MISCELLANEOUS) ×4 IMPLANT
SPONGE GAUZE 4X4 12PLY STER LF (GAUZE/BANDAGES/DRESSINGS) ×2 IMPLANT
STOPCOCK 4 WAY LG BORE MALE ST (IV SETS) ×1 IMPLANT
SYR 20CC LL (SYRINGE) ×2 IMPLANT
SYR 20ML ECCENTRIC (SYRINGE) ×2 IMPLANT
SYR 5ML LUER SLIP (SYRINGE) ×2 IMPLANT
SYRINGE 10CC LL (SYRINGE) ×6 IMPLANT
TOWEL OR 17X24 6PK STRL BLUE (TOWEL DISPOSABLE) ×2 IMPLANT
TRAP SPECIMEN MUCOUS 40CC (MISCELLANEOUS) IMPLANT
TUBE CONNECTING 20X1/4 (TUBING) ×3 IMPLANT
WATER STERILE IRR 1000ML POUR (IV SOLUTION) ×2 IMPLANT

## 2014-10-17 NOTE — Op Note (Signed)
Video Bronchoscopy Procedure Note  Pre-Procedure Diagnoses: 1. Mediastinal lymphadenopathy 2. History of Hodgkin's lymphoma  Consent:  Informed consent was obtained from the  Patient after discussing the risks and benefits of the procedure including bleeding, infection, pneumothorax, medication allergy, vocal chord injury, and potentially death.  Medications Administered:   Per anesthesia records. 1. Lidocaine 1% gargle 10cc 2. Lidocaine 1% 14cc via bronchoscope  Pre-Procedure Physical Exam: General:  No acute distress. Awake. Alert. ASA Class 1. HEENT:  Moist mucus membranes. No oral ulcers. Mallampati Class I.. Cardiovascular:  Regular rate. No edema. No appreciable JVD. Pulmonary:  Clear to auscultation with good aeration bilaterally. Normal work of breathing. Abdomen:  Soft. Nontender. Nondistended. Normal bowel sounds. Musculoskeletal:  Normal bulk and tone. Normal neck flexion & extension. Neurological:  Oriented to person, place, and time. Moving all 4 extremities equally.  Description of Procedure: Patient was brought back to OR room 10.  A time out was performed to identify the correct patient and procedure.  Lidocaine gargle was performed.  Patient was laid recumbent and conscious sedation was administered by Anesthesia.  Bite block was inserted and towel was placed over the patient's eyes.  Flexible bronchoscope was then inserted into the posterior pharynx until vocal chords were in full view. There was no abnormality of the vocal chords or arytenoids.  A total of 6cc of Lidocaine was used to anesthetize the vocal chords.  The bronchoscope was then inserted between the vocal chords with ease into the proximal trachea.  Lidocaine was then used to anesthetize the patient's proximal airways.  An airway inspeciton was performed finding a minimal amount of thin, clear secretions.  The flexible bronchoscope was then removed from the patient's airways after suctioning of the remaining  secretions.  The endobronchial ultrasound scope was then inserted into the posterior pharynx and between the vocal chords with ease.  An enlarged lymph node at level 10L was identified with endobronchial ultrasound.  Under direct visualization with the ultrasound a total of 3 passes were performed at level 10L with a 22-gauge needle.  Hemostasis was directly visualized.  The remaining secretions were suctioned from the patient's airways and the endobronchial ultrasound scope was removed.  The bite block was removed and the patient was returned to the upright position.  Blood Loss:  Less than 10cc.  Complications:  None.  EBUS Procedure: Level 4R:  Single lymph node / 5 mm / no biopsy performed Level 7:  No lymph node visualized Level 10L:  Two 1 cm lymph nodes / biopsy performed / ROSE / passes 1 & 3 for cytology & cell block / pass 2 for flow cytometry   Post Procedure Stat Portable CXR:  Ordered and pending.  Post Procedure Instructions: Irven Coe spoke with the patient's husband relaying the preliminary results of the procedure.  Instructed him that the patient is not to operate a car for 24 hours.  The patient should seek immediate medical attention if there is any persistent or progressive hemoptysis, difficulty breathing, or chest pain/discomfort.  The patient should also notify me immediately or seek medical attention for any purulent sputum production or fever occuring today or in the coming days.  The patient will be contacted by me once the final results of the studies are available.  The patient should call my office if they have any questions.

## 2014-10-17 NOTE — Anesthesia Preprocedure Evaluation (Addendum)
Anesthesia Evaluation  Patient identified by MRN, date of birth, ID band Patient awake    Reviewed: Allergy & Precautions, NPO status , Patient's Chart, lab work & pertinent test results  History of Anesthesia Complications Negative for: history of anesthetic complications  Airway Mallampati: I  TM Distance: >3 FB     Dental  (+) Teeth Intact, Dental Advisory Given   Pulmonary neg pulmonary ROS,  Hx.cancer Rt. Lung treated with chemo x 6 mos. 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  Takes ProzacRt. Foot pain numbness 2nd to chemo.      GI/Hepatic Neg liver ROS, GERD-  Medicated and Controlled,Colon cancer: chemo   Endo/Other  Hypothyroidism Morbid obesity  Renal/GU negative Renal ROS     Musculoskeletal  (+) Arthritis -, Osteoarthritis,    Abdominal Normal abdominal exam  (+) + obese,   Peds  Hematology negative hematology ROS (+)   Anesthesia Other Findings   Reproductive/Obstetrics                            Anesthesia Physical Anesthesia Plan  ASA: III  Anesthesia Plan: MAC   Post-op Pain Management:    Induction: Intravenous  Airway Management Planned:   Additional Equipment:   Intra-op Plan:   Post-operative Plan:   Informed Consent: I have reviewed the patients History and Physical, chart, labs and discussed the procedure including the risks, benefits and alternatives for the proposed anesthesia with the patient or authorized representative who has indicated his/her understanding and acceptance.   Dental advisory given  Plan Discussed with: CRNA, Anesthesiologist and Surgeon  Anesthesia Plan Comments: (Plan routine monitors, MAC)       Anesthesia Quick Evaluation

## 2014-10-17 NOTE — Interval H&P Note (Signed)
H&P reviewed. No change in exam or history.  Sonia Baller Ashok Cordia, M.D. [facility] Pager:  [phone] After 3pm or if no response, call [phone]

## 2014-10-17 NOTE — Progress Notes (Signed)
Evaluating pt's swallowing post op -- drinking fluids at this time. Pt denies any difficulty swallowing.

## 2014-10-17 NOTE — H&P (View-Only) (Signed)
Subjective:    Patient ID: Diamond Boyd, female    DOB: May 31, 1940, 74 y.o.   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 She has a known history of Hodgkin lymphoma diagnosed 05/20/2013 that was stage IV with bone marrow involvement treated with AVD from 05/2013 to 11/2013. She reports she has significant fatigue. She reports dyspnea on exertion. Denies any coughing. She reports a "deep" pain in her anterior sternum that "comes & goes".  It only lasts for a few seconds. She reports the pain occurs at rest without any inciting factors. Denies any fever, chills, or sweats. She is gaining weight. Has a good appetite. She reports she feels like her cervical lymph nodes seem to be enlarging. No dysphagia or odynophagia. No nausea, emesis, abdominal pain, or diarrhea. No melena or hematochezia. No rashes but does bruise easily.   Review of Systems No dysuria or hematuria. Reports chronic back pain particularly in her lower back. She reports bone pain in her bilateral upper arms. No syncope or near syncope. A pertinent 14 point review of systems is negative except as per the history of presenting illness.  Allergies  Allergen Reactions  . Achromycin [Tetracycline Hcl]     Pt does not remember reaction  . Allopurinol     REACTION: Unsure of reaction happene years ago  . Astelin [Azelastine Hcl]     Unknown  . Cephalexin     REACTION: unsure of reaction happened yrs ago.  . Codeine Other (See Comments)    REACTION: abd. pain  . Meloxicam Other (See Comments)    REACTION: GI symptoms  . Minocycline Other (See Comments)    Abdominal pain  . Nabumetone     REACTION: reaction not known  . Nyquil [Pseudoeph-Doxylamine-Dm-Apap] Hives  . Penicillins     REACTION: reaction not known  . Sulfa Antibiotics     Gi side eff   . Zolpidem Tartrate       REACTION: feels too drugged  . Buspar [Buspirone Hcl]     Dizziness, and not as effective for anxiety  . Ciprofloxacin Rash   Current Outpatient Prescriptions on File Prior to Visit  Medication Sig Dispense Refill  . acetaminophen (TYLENOL) 500 MG tablet Take 1,000 mg by mouth every 6 (six) hours as needed for mild pain or moderate pain.    . Calcium Carbonate-Vitamin D (CALCIUM 600+D) 600-400 MG-UNIT per tablet Take 1 tablet by mouth daily.     . celecoxib (CELEBREX) 200 MG capsule Take 1 capsule (200 mg total) by mouth daily as needed for moderate pain (with food). 90 capsule 3  . cholecalciferol (VITAMIN D) 1000 UNITS tablet Take 1,000 Units by mouth daily.     Marland Kitchen EPIPEN 2-PAK 0.3 MG/0.3ML SOAJ injection as needed.    Marland Kitchen estradiol (ESTRACE) 2 MG tablet Take 0.5 tablets (1 mg total) by mouth daily. Takes 1/2 tablet 45 tablet 3  . fexofenadine (ALLEGRA) 180 MG tablet TAKE 1 TABLET (180 MG TOTAL) BY MOUTH DAILY. 90 tablet 0  . FLUoxetine (PROZAC) 20 MG capsule Take 2 capsules (40 mg total) by mouth every morning. 180 capsule 3  . fluticasone (FLONASE) 50 MCG/ACT nasal spray Place 1 spray into both  nostrils 2 (two) times  daily. 48 g 0  . gabapentin (NEURONTIN) 300 MG capsule Take 1 capsule (300 mg total)  by mouth 3 (three) times daily. (Patient taking differently: Take 300 mg by mouth 2 (two) times daily. ) 90 capsule 3  . levothyroxine (SYNTHROID, LEVOTHROID) 112 MCG tablet Take 1 tablet by mouth  daily 90 tablet 0  . Omega-3 350 MG CAPS Take 1 capsule by mouth daily.    Marland Kitchen omeprazole (PRILOSEC) 40 MG capsule Take 1 capsule by mouth two times daily 180 capsule 0  . psyllium (METAMUCIL) 58.6 % packet Take 1-3 packets by mouth daily.     . simvastatin (ZOCOR) 40 MG tablet Take 1 tablet by mouth  daily 90 tablet 0  . vitamin E (VITAMIN E) 400 UNIT capsule Take 400 Units by mouth daily.      No current facility-administered medications on file prior to visit.   Past Medical History   Diagnosis Date  . Colon polyps   . Depression   . Hypothyroidism   . Osteoarthritis     hands  . Peripheral vascular disease   . Degenerative disk disease     spine in center in past   . 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 Cancer     skin cancer- basal cell on arm / colon 2011  . Lymphoma   . Colon cancer     08-2009  . 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 Cancer     colon CA and lung CA   Past Surgical History  Procedure Laterality Date  . Cholecystectomy    . Splenectomy      lymphoma  . Breast biopsy  1996  . Plantar fascia release    . Ventral hernia repair  1998  . Tendon release      Right thumb   . Colectomy  8/11  . Vaginal hysterectomy    . Bone marrow biopsy  05/27/13  . Cataract extraction, bilateral  2016  . Port-a-cath insertion    . Colonoscopy w/ biopsies    . Dg biopsy lung     Family History  Problem Relation Age of Onset  . Coronary artery disease Mother   . Alcohol abuse Mother   . Diabetes Mother   . Cancer Father     brain  . Diabetes Brother   . Fibromyalgia Daughter     chronic pain   . COPD Daughter   . Colon cancer Neg Hx   . Colon polyps Neg Hx   . Stomach cancer Neg Hx   . Rectal cancer Neg Hx   . Ulcerative colitis Neg Hx   . Crohn's disease Neg Hx   . Asthma Daughter   . Rheum arthritis Brother   . Clotting disorder Brother    Social History   Social History  . Marital Status: Married    Spouse Name: N/A  . Number of Children: 2  . Years of Education: N/A   Occupational History  . Retired     Social History Main Topics  . Smoking status: Never Smoker   . Smokeless tobacco: Never Used     Comment: Second-hand exposure through father.  . Alcohol Use: No  . Drug Use: No  . Sexual Activity: Not Asked   Other Topics Concern  . None   Social History Narrative   Originally from [place]. Always lived in [place]. Previously has traveled to [facility], [place] & up [employer] to [place]. No international travel. No pets  currently. Remote parakeet exposure with her children. 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  Objective:   Physical Exam Blood pressure 124/68, pulse 76, height 5' 6"  (1.676 m), weight 237 lb 6.4 oz (107.684 kg), last menstrual period 02/10/1970, SpO2 98 %. General:  Awake. Alert. No acute distress. Accompanied by husband today. Morbidly obese. ASA Class I. Integument:  Warm & dry. No rash on exposed skin. No bruising. Lymphatics:  No appreciated cervical or supraclavicular lymphadenoapthy. HEENT:  Moist mucus membranes. No oral ulcers. No scleral injection or icterus. Mallampati Class I. Cardiovascular:  Regular rate. Trace lower extremity edema. No appreciable JVD.  Pulmonary:  Good aeration & clear to auscultation bilaterally. Symmetric chest wall expansion. No accessory muscle use. Abdomen: Soft. Normal bowel sounds. Protuberant. Grossly nontender. Musculoskeletal:  Normal bulk and tone. Hand grip strength 5/5 bilaterally. No joint deformity or effusion appreciated. Neurological:  CN 2-12 grossly in tact. No meningismus. Moving all 4 extremities equally. Symmetric patellar deep tendon reflexes. Psychiatric:  Mood and affect congruent. Speech normal rhythm, rate & tone.   IMAGING PET CT 09/27/14 (personally reviewed by me): Hypermetabolic bilateral hilar lymph nodes as well as hypermetabolic pretracheal lymph node. 10 mm nodules noted within parotid glands with intense metabolic activity as well as diffuse metabolic activity in the thyroid suggesting thyroiditis. No suspicious pulmonary nodules or masses. No pleural effusion or thickening. No pericardial effusion. No hypermetabolic areas within the abdominal or pelvic lymph nodes. There are 2 hypermetabolic foci in the right sacral ala. Additional hypermetabolic foci in the left pubic ramus. Radiology commented that there is no clear CT correlation to these lesions.  PATHOLOGY COLON POLYP (09/05/14): Hyperplastic  polyp with benign mucosa with lymphoid aggregates.  FLOW CYTOMETRY (07/07/14): Insufficient cellular material for analysis.  RIGHT SUBMANDIBULAR NODE (07/07/14): Limited lymphoid tissue. Suboptimal for evaluation.  BONE MARROW BIOPSY (05/26/13): & Clear bone marrow with atypical lymphohistiocytic proliferation with features similar to previously diagnosed classical Hodgkin's lymphoma.  RIGHT LUNG PERCUTANEOUS Bx (05/03/13): Neoplasm most consistent with classical Hodgkin lymphoma.  LABS 09/27/14 CBC: 7.1/13.1/39.2/222 BMP: 142/4.2/105/29/13/0.8/92/9.4 LFT: 4.0/6.3/0.57/156/25/20 LDH: 181    Assessment & Plan:  73 year old female with history of colon cancer and Hodgkin's lymphoma presenting with progression of mediastinal and hilar lymphadenopathy that his PET/CT scan positive. This is concerning for recurrence of her Hodgkin's lymphoma. I reviewed the PET/CT imaging with the patient and her husband during the exam today. I feel it's reasonable to proceed with an endobronchial ultrasound-guided fine-needle aspiration of either her precarinal lymph node or left hilar lymph node. This will be sent for flow cytometry as well as evaluation by cytopathology. I discussed the risks of the procedure including medication allergy, bleeding, infection, pneumothorax, vocal cord injury, and potentially death. The patient is willing to undergo the procedure.  1. Mediastinal/hilar lymphadenopathy: Likely recurrence of Hodgkin's lymphoma. Plan for bronchoscopy with endobronchial ultrasound exam and fine-needle aspiration of mediastinal/hilar lymphadenopathy. This will be sent for flow cytometry. Checking PT/INR today. 2. History of Hodgkin's lymphoma: Concerning for recurrence based on recent PET/CT imaging. Follows with Dr. Alvy Bimler. 3. Follow-up: Patient will return to clinic in 2-4 weeks.

## 2014-10-17 NOTE — Transfer of Care (Signed)
Immediate Anesthesia Transfer of Care Note  Patient: Diamond Boyd  Procedure(s) Performed: Procedure(s): VIDEO BRONCHOSCOPY WITH ENDOBRONCHIAL ULTRASOUND (N/A)  Patient Location: PACU  Anesthesia Type:MAC  Level of Consciousness: awake, alert , oriented and patient cooperative  Airway & Oxygen Therapy: Patient Spontanous Breathing  Post-op Assessment: Report given to RN, Post -op Vital signs reviewed and stable and Patient moving all extremities  Post vital signs: Reviewed and stable  Last Vitals:  Filed Vitals:   10/17/14 0618  BP: 170/83  Pulse: 75  Temp: 36.3 C  Resp: 20    Complications: No apparent anesthesia complications

## 2014-10-17 NOTE — Progress Notes (Signed)
Pt gone to xray for 2v chest.

## 2014-10-17 NOTE — Anesthesia Postprocedure Evaluation (Signed)
  Anesthesia Post-op Note  Patient: Diamond Boyd  Procedure(s) Performed: Procedure(s): VIDEO BRONCHOSCOPY WITH ENDOBRONCHIAL ULTRASOUND (N/A)  Patient Location: PACU  Anesthesia Type:MAC  Level of Consciousness: awake, alert , oriented and patient cooperative  Airway and Oxygen Therapy: Patient Spontanous Breathing  Post-op Pain: mild sore throat  Post-op Assessment: Post-op Vital signs reviewed, Patient's Cardiovascular Status Stable, Respiratory Function Stable, Patent Airway, No signs of Nausea or vomiting and Pain level controlled              Post-op Vital Signs: Reviewed and stable  Last Vitals:  Filed Vitals:   10/17/14 1107  BP:   Pulse:   Temp: 36.4 C  Resp:     Complications: No apparent anesthesia complications

## 2014-10-17 NOTE — Progress Notes (Signed)
Dr.Nestor notified of CXR report. PA and Lat CXR ordered. Pt breathing well. Will cont to monitor.

## 2014-10-17 NOTE — Discharge Instructions (Signed)
Do not to operate a car for 24 hours. The patient should seek immediate medical attention if there is any persistent or progressive hemoptysis, difficulty breathing, or chest pain/discomfort. The patient should also notify me immediately or seek medical attention for any purulent sputum production or fever occuring today or in the coming days.

## 2014-10-18 ENCOUNTER — Encounter (HOSPITAL_COMMUNITY): Payer: Self-pay | Admitting: Pulmonary Disease

## 2014-10-20 ENCOUNTER — Institutional Professional Consult (permissible substitution): Payer: Medicare Other | Admitting: Pulmonary Disease

## 2014-10-20 ENCOUNTER — Other Ambulatory Visit: Payer: Self-pay | Admitting: Hematology and Oncology

## 2014-10-20 ENCOUNTER — Telehealth: Payer: Self-pay | Admitting: Hematology and Oncology

## 2014-10-20 DIAGNOSIS — C8190 Hodgkin lymphoma, unspecified, unspecified site: Secondary | ICD-10-CM

## 2014-10-20 NOTE — Telephone Encounter (Signed)
I reviewed the test results with the patient's husband. Bronchoscopy guided needle biopsy was non-diagnostic. I gave him option of either consult to ENT for parotid lymph node biopsy versus CT surgery for mediastinoscopy and biopsy. After discussions weighing options, risks and benefits, the patient elected for CT surgery approach for mediastinoscopy and biopsy. Urgent consult is sent.

## 2014-10-20 NOTE — Telephone Encounter (Signed)
I placed urgent CT surgery consult Can you fax that to Dr. Everrett Coombe office for urgent consult and call?

## 2014-10-20 NOTE — Telephone Encounter (Signed)
Pt left VM states she wants Lung Biopsy.   She asks for Dr. Alvy Bimler to set up appointment for "Lung Specialist."

## 2014-10-23 ENCOUNTER — Telehealth: Payer: Self-pay | Admitting: *Deleted

## 2014-10-23 ENCOUNTER — Ambulatory Visit: Payer: Medicare Other | Admitting: Pulmonary Disease

## 2014-10-23 NOTE — Telephone Encounter (Signed)
Husband left VM asking about arrangements for pt to have lung biopsy.  Dr. Alvy Bimler sent referral on Friday.  I called [facility] and s/w Cindy in New Pt Referral.  She will contact pt either today or tomorrow w/ appt.   I informed husband and he verbalized understanding.

## 2014-10-24 ENCOUNTER — Encounter: Payer: Self-pay | Admitting: Family Medicine

## 2014-10-24 ENCOUNTER — Ambulatory Visit (INDEPENDENT_AMBULATORY_CARE_PROVIDER_SITE_OTHER): Payer: Medicare Other | Admitting: Family Medicine

## 2014-10-24 VITALS — BP 118/76 | HR 80 | Temp 98.8°F | Ht 66.0 in | Wt 232.0 lb

## 2014-10-24 DIAGNOSIS — J302 Other seasonal allergic rhinitis: Secondary | ICD-10-CM

## 2014-10-24 MED ORDER — LEVOTHYROXINE SODIUM 112 MCG PO TABS: ORAL_TABLET | ORAL | Status: DC

## 2014-10-24 MED ORDER — SIMVASTATIN 40 MG PO TABS: ORAL_TABLET | ORAL | Status: DC

## 2014-10-24 MED ORDER — HYDROCODONE-CHLORPHENIRAMINE 5-4 MG/5ML PO SOLN: 5.0000 mL | Freq: Two times a day (BID) | ORAL | Status: DC | PRN

## 2014-10-24 MED ORDER — CELECOXIB 200 MG PO CAPS: 200.0000 mg | ORAL_CAPSULE | Freq: Every day | ORAL | Status: DC | PRN

## 2014-10-24 NOTE — Progress Notes (Signed)
Pre visit review using our clinic review tool, if applicable. No additional management support is needed unless otherwise documented below in the visit note. 

## 2014-10-24 NOTE — Patient Instructions (Signed)
You may want to get an appointment with your allergist (it sounds like you don't need an epi pen unless you are getting allergy shots)   If you develop green nasal discharge/fever or other symptoms of sinus infection please let me know  Use cough medicine only if needed  I wonder if allergra does not work as well as it used to - try zyrtec (generic is fine) 10 mg daily- instead  Continue flonase nasal spray

## 2014-10-24 NOTE — Progress Notes (Signed)
Subjective:    Patient ID: Diamond Boyd, female    DOB: 05-29-1940, 74 y.o.   MRN: [id number]  HPI Here for uri symptoms   Had a biopsy- and some throat irritation after that     (did not get cells enough- will have to have surgery) Caused st and hoarseness and cough  No energy and feels hot (but no chills) and has not had a fever  Drainage down the back of her throat  Some sinus pain in the cheeks   Mucous - all clear  Ears are fine    Lab Results  Component Value Date   TSH 2.53 09/16/2013   wants to re check it in light of feeling hot   Takes 1/2 estrogen  Has not had allergy shots in over a year -? If she can do with chemo   Patient Active Problem List   Diagnosis Date Noted  . Chronic lower back pain 09/28/2014  . Mediastinal lymphadenopathy 09/27/2014  . 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 Peripheral neuropathy due to chemotherapy 04/03/2014  . Trochanteric bursitis of right hip 04/03/2014  . Anemia in chronic illness 02/21/2014  . Elevated liver enzymes 02/21/2014  . Urinary tract infection 11/18/2013  . Candidal intertrigo 09/23/2013  . Atrophic vaginitis 09/23/2013  . Drug induced neutropenia(288.03) 09/01/2013  . Colon cancer 06/17/2013  . Hodgkin lymphoma 05/20/2013  . Dysuria 05/20/2013  . Conjunctivitis, acute 04/27/2013  . Cold sore 04/27/2013  . Left temporal headache 03/11/2013  . Hyperglycemia 03/11/2013  . Acute sinusitis 02/09/2013  . Other malaise and fatigue 01/04/2013  . Herpes zoster 11/22/2012  . Vaginal pain 11/22/2012  . Cystocele 09/22/2012  . Encounter for Medicare annual wellness exam 09/17/2012  . Left knee pain 09/10/2012  . Thoracic back pain 06/01/2012  . Skin lesion of face 05/17/2012  . Other screening mammogram 07/22/2011  . Routine gynecological examination 07/22/2011  . Colon cancer screening 07/22/2011  . History of anemia 05/06/2011  . 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 Hypothyroid 02/24/2011    . Varicose veins 02/24/2011  . Elevated blood pressure 11/20/2010  . Obesity 11/20/2010  . 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 Other chronic sinusitis 07/03/2009  . THROAT PAIN, CHRONIC 07/03/2009  . GERD 12/20/2008  . Anxiety state, unspecified 08/22/2008  . MENOPAUSAL SYNDROME 07/13/2007  . HYPERCHOLESTEROLEMIA, PURE 07/08/2006  . Allergic rhinitis 07/08/2006  . OVERACTIVE BLADDER 07/08/2006  . 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 DEPRESSION 06/25/2006  . PERIPHERAL VASCULAR DISEASE 06/25/2006  . OSTEOARTHRITIS 06/25/2006  . LEG EDEMA 06/25/2006  . SKIN CANCER, HX OF 06/25/2006  . COLONIC POLYPS, HX OF 06/25/2006   Past Medical History  Diagnosis Date  . Colon polyps   . Depression   . Hypothyroidism   . Osteoarthritis     hands  . Peripheral vascular disease   . Degenerative disk disease     spine in center in past   . 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 Cancer     skin cancer- basal cell on arm / colon 2011  . Lymphoma   . Colon cancer     08-2009  . 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 Cancer     colon CA and lung CA  . Heart murmur   . GERD (gastroesophageal reflux disease)   . Anemia     hx blood tx   Past Surgical History  Procedure Laterality  Date  . Cholecystectomy    . Splenectomy      lymphoma  . Breast biopsy  1996  . Plantar fascia release    . Ventral hernia repair  1998  . Tendon release      Right thumb   . Colectomy  8/11  . Vaginal hysterectomy    . Bone marrow biopsy  05/27/13  . Cataract extraction, bilateral  2016  . Port-a-cath insertion    . Colonoscopy w/ biopsies    . Dg biopsy lung    . Video bronchoscopy with endobronchial ultrasound N/A 10/17/2014    Procedure: VIDEO BRONCHOSCOPY WITH ENDOBRONCHIAL ULTRASOUND;  Surgeon: Javier Glazier, MD;  Location: [facility];  Service: Thoracic;  Laterality: N/A;   Social History  Substance Use Topics  .  Smoking status: Never Smoker   . Smokeless tobacco: Never Used     Comment: Second-hand exposure through father.  . Alcohol Use: No   Family History  Problem Relation Age of Onset  . Coronary artery disease Mother   . Alcohol abuse Mother   . Diabetes Mother   . Cancer Father     brain  . Diabetes Brother   . Fibromyalgia Daughter     chronic pain   . COPD Daughter   . Colon cancer Neg Hx   . Colon polyps Neg Hx   . Stomach cancer Neg Hx   . Rectal cancer Neg Hx   . Ulcerative colitis Neg Hx   . Crohn's disease Neg Hx   . Asthma Daughter   . Rheum arthritis Brother   . Clotting disorder Brother    Allergies  Allergen Reactions  . Achromycin [Tetracycline Hcl]     Pt does not remember reaction  . Allopurinol     REACTION: Unsure of reaction happene years ago  . Astelin [Azelastine Hcl]     Unknown  . Cephalexin     REACTION: unsure of reaction happened yrs ago.  . Codeine Other (See Comments)    REACTION: abd. pain  . Meloxicam Other (See Comments)    REACTION: GI symptoms  . Minocycline Other (See Comments)    Abdominal pain  . Nabumetone     REACTION: reaction not known  . Nyquil [Pseudoeph-Doxylamine-Dm-Apap] Hives  . Penicillins     REACTION: reaction not known  . Sulfa Antibiotics     Gi side eff   . Zolpidem Tartrate     REACTION: feels too drugged  . Buspar [Buspirone Hcl]     Dizziness, and not as effective for anxiety  . Ciprofloxacin Rash   Current Outpatient Prescriptions on File Prior to Visit  Medication Sig Dispense Refill  . acetaminophen (TYLENOL) 500 MG tablet Take 1,000 mg by mouth every 6 (six) hours as needed for mild pain or moderate pain.    . Calcium Carbonate-Vitamin D (CALCIUM 600+D) 600-400 MG-UNIT per tablet Take 1 tablet by mouth daily.     . cholecalciferol (VITAMIN D) 1000 UNITS tablet Take 1,000 Units by mouth daily.     Marland Kitchen EPIPEN 2-PAK 0.3 MG/0.3ML SOAJ injection as needed.    Marland Kitchen estradiol (ESTRACE) 2 MG tablet Take 0.5 tablets  (1 mg total) by mouth daily. Takes 1/2 tablet 45 tablet 3  . fexofenadine (ALLEGRA) 180 MG tablet TAKE 1 TABLET (180 MG TOTAL) BY MOUTH DAILY. 90 tablet 0  . FLUoxetine (PROZAC) 20 MG capsule Take 2 capsules (40 mg total) by mouth every morning. 180 capsule 3  . fluticasone (FLONASE) 50  MCG/ACT nasal spray Place 1 spray into both  nostrils 2 (two) times  daily. 48 g 0  . gabapentin (NEURONTIN) 300 MG capsule Take 1 capsule (300 mg total) by mouth 3 (three) times daily. (Patient taking differently: Take 300 mg by mouth 2 (two) times daily. ) 90 capsule 3  . Omega-3 350 MG CAPS Take 1 capsule by mouth daily.    Marland Kitchen omeprazole (PRILOSEC) 40 MG capsule Take 1 capsule by mouth two times daily 180 capsule 0  . psyllium (METAMUCIL) 58.6 % packet Take 1-3 packets by mouth daily.     . vitamin E (VITAMIN E) 400 UNIT capsule Take 400 Units by mouth daily.      No current facility-administered medications on file prior to visit.    Review of Systems    Review of Systems  Constitutional: Negative for fever, appetite change,  and unexpected weight change.  ENt pos for cong and rhinorrhea and post nasal drip with hoarseness Eyes: Negative for pain and visual disturbance.  Respiratory: Negative for wheeze  and shortness of breath.   Cardiovascular: Negative for cp or palpitations    Gastrointestinal: Negative for nausea, diarrhea and constipation.  Genitourinary: Negative for urgency and frequency.  Skin: Negative for pallor or rash   Neurological: Negative for weakness, light-headedness, numbness and headaches.  Hematological: Negative for adenopathy. Does not bruise/bleed easily.  Psychiatric/Behavioral: Negative for dysphoric mood. The patient is not nervous/anxious.      Objective:   Physical Exam  Constitutional: She appears well-developed and well-nourished. No distress.  HENT:  Head: Normocephalic and atraumatic.  Right Ear: External ear normal.  Left Ear: External ear normal.  Mouth/Throat:  Oropharynx is clear and moist.  Nares are injected and congested  No sinus tenderness Clear rhinorrhea and post nasal drip   Eyes: Conjunctivae and EOM are normal. Pupils are equal, round, and reactive to light. Right eye exhibits no discharge. Left eye exhibits no discharge.  Neck: Normal range of motion. Neck supple.  Cardiovascular: Normal rate and normal heart sounds.   Pulmonary/Chest: Effort normal and breath sounds normal. No respiratory distress. She has no wheezes. She has no rales. She exhibits no tenderness.  Musculoskeletal: She exhibits no edema.  Lymphadenopathy:    She has no cervical adenopathy.  Neurological: She is alert.  Skin: Skin is warm and dry. No rash noted.  Psychiatric: She has a normal mood and affect.          Assessment & Plan:   Problem List Items Addressed This Visit      Respiratory   Allergic rhinitis - Primary    Suspect this is cause of continued rhinorrhea and congestion and post nasal drainage  Reassuring exam today  Do not feel she has a sinus infection at this time but need to watch carefully Will return to allergist when able (doubt she can get immunotx with chemo)  Will change to a different antihistamine (zyrtec) Continue flonase daily Also allergen avoidance

## 2014-10-26 ENCOUNTER — Institutional Professional Consult (permissible substitution) (INDEPENDENT_AMBULATORY_CARE_PROVIDER_SITE_OTHER): Payer: Medicare Other | Admitting: Cardiothoracic Surgery

## 2014-10-26 ENCOUNTER — Encounter: Payer: Self-pay | Admitting: Cardiothoracic Surgery

## 2014-10-26 ENCOUNTER — Other Ambulatory Visit: Payer: Self-pay | Admitting: *Deleted

## 2014-10-26 VITALS — BP 159/83 | HR 83 | Resp 20 | Ht 66.0 in | Wt 232.0 lb

## 2014-10-26 DIAGNOSIS — C8190 Hodgkin lymphoma, unspecified, unspecified site: Secondary | ICD-10-CM

## 2014-10-26 NOTE — Progress Notes (Signed)
[address]       [place] [address]             [phone]                    Carisa T Laver  Medical Record #[id number] Date of Birth: 01-25-1941  Referring: [name], MD Primary Care: [name], MD  Chief Complaint:    Chief Complaint  Patient presents with  . Lymphoma    Surgical eval, last PET Scan 09/27/2014     History of Present Illness:    Diamond Boyd 74 y.o. female is seen in the office  today after multiple other attempts at biopsy have been unsuccessful. The patient has a history of known classic Hodgkin's disease most recently biopsied from a right lower lobe lung mass and subsequently treated. Follow-up scans recently have showed increasing mediastinal hypermetabolic activity. Attempt at ebus of a 10 R node was unsuccessful in obtaining a tissue diagnosis earlier this week by the pulmonary division. Patient is referred for consideration of mediastinoscopy.     Current Activity/ Functional Status:  Patient is independent with mobility/ambulation, transfers, ADL's, IADL's.   Zubrod Score: At the time of surgery this patient's most appropriate activity status/level should be described as: []    0    Normal activity, no symptoms [x]    1    Restricted in physical strenuous activity but ambulatory, able to do out light work []    2    Ambulatory and capable of self care, unable to do work activities, up and about               >50 % of waking hours                              []    3    Only limited self care, in bed greater than 50% of waking hours []    4    Completely disabled, no self care, confined to bed or chair []    5    Moribund   Past Medical History  Diagnosis Date  . Colon polyps   . Depression   . Hypothyroidism   . Osteoarthritis     hands  . Peripheral vascular disease   . Degenerative disk disease     spine in center in past   . 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 Cancer     skin cancer- basal  cell on arm / colon 2011  . Lymphoma   . Colon cancer     08-2009  . 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 Cancer     colon CA and lung CA  . Heart murmur   . GERD (gastroesophageal reflux disease)   . Anemia     hx blood tx    Past Surgical History  Procedure Laterality Date  . Cholecystectomy    . Splenectomy      lymphoma  . Breast biopsy  1996  . Plantar fascia release    . Ventral hernia repair  1998  . Tendon release      Right thumb   . Colectomy  8/11  . Vaginal hysterectomy    . Bone marrow biopsy  05/27/13  . Cataract extraction, bilateral  2016  . Port-a-cath insertion    . Colonoscopy w/ biopsies    . Dg  biopsy lung    . Video bronchoscopy with endobronchial ultrasound N/A 10/17/2014    Procedure: VIDEO BRONCHOSCOPY WITH ENDOBRONCHIAL ULTRASOUND;  Surgeon: Javier Glazier, MD;  Location: [facility] OR;  Service: Thoracic;  Laterality: N/A;    Family History  Problem Relation Age of Onset  . Coronary artery disease Mother   . Alcohol abuse Mother   . Diabetes Mother   . Cancer Father     brain  . Diabetes Brother   . Fibromyalgia Daughter     chronic pain   . COPD Daughter   . Colon cancer Neg Hx   . Colon polyps Neg Hx   . Stomach cancer Neg Hx   . Rectal cancer Neg Hx   . Ulcerative colitis Neg Hx   . Crohn's disease Neg Hx   . Asthma Daughter   . Rheum arthritis Brother   . Clotting disorder Brother     Social History   Social History  . Marital Status: Married    Spouse Name: N/A  . Number of Children: 2  . Years of Education: N/A   Occupational History  . Retired     Social History Main Topics  . Smoking status: Never Smoker   . Smokeless tobacco: Never Used     Comment: Second-hand exposure through father.  . Alcohol Use: No  . Drug Use: No  . Sexual Activity: Not on file   Other Topics Concern  . Not on file   Social History Narrative   Originally from [place]. Always lived in [place]. Previously has traveled to [facility], [place] & up  [employer] to [place]. No international travel. No pets currently. Remote parakeet exposure with her children. 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        History  Smoking status  . Never Smoker   Smokeless tobacco  . Never Used    Comment: Second-hand exposure through father.    History  Alcohol Use No     Allergies  Allergen Reactions  . Achromycin [Tetracycline Hcl]     Pt does not remember reaction  . Allopurinol     REACTION: Unsure of reaction happene years ago  . Astelin [Azelastine Hcl]     Unknown  . Cephalexin     REACTION: unsure of reaction happened yrs ago.  . Codeine Other (See Comments)    REACTION: abd. pain  . Meloxicam Other (See Comments)    REACTION: GI symptoms  . Minocycline Other (See Comments)    Abdominal pain  . Nabumetone     REACTION: reaction not known  . Nyquil [Pseudoeph-Doxylamine-Dm-Apap] Hives  . Penicillins     REACTION: reaction not known  . Sulfa Antibiotics     Gi side eff   . Zolpidem Tartrate     REACTION: feels too drugged  . Buspar [Buspirone Hcl]     Dizziness, and not as effective for anxiety  . Ciprofloxacin Rash    Current Outpatient Prescriptions  Medication Sig Dispense Refill  . acetaminophen (TYLENOL) 500 MG tablet Take 1,000 mg by mouth every 6 (six) hours as needed for mild pain or moderate pain.    . Calcium Carbonate-Vitamin D (CALCIUM 600+D) 600-400 MG-UNIT per tablet Take 1 tablet by mouth daily.     . celecoxib (CELEBREX) 200 MG capsule Take 1 capsule (200 mg total) by mouth daily as needed for moderate pain (with food). 90 capsule 3  . cholecalciferol (VITAMIN D) 1000 UNITS tablet Take 1,000 Units by mouth  daily.     Marland Kitchen EPIPEN 2-PAK 0.3 MG/0.3ML SOAJ injection as needed.    Marland Kitchen estradiol (ESTRACE) 2 MG tablet Take 0.5 tablets (1 mg total) by mouth daily. Takes 1/2 tablet 45 tablet 3  . fexofenadine (ALLEGRA) 180 MG tablet TAKE 1 TABLET (180 MG TOTAL) BY MOUTH DAILY. 90 tablet 0  .  FLUoxetine (PROZAC) 20 MG capsule Take 2 capsules (40 mg total) by mouth every morning. 180 capsule 3  . fluticasone (FLONASE) 50 MCG/ACT nasal spray Place 1 spray into both  nostrils 2 (two) times  daily. 48 g 0  . gabapentin (NEURONTIN) 300 MG capsule Take 1 capsule (300 mg total) by mouth 3 (three) times daily. (Patient taking differently: Take 300 mg by mouth 2 (two) times daily. ) 90 capsule 3  . Hydrocodone-Chlorpheniramine 5-4 MG/5ML SOLN Take 5 mLs by mouth 2 (two) times daily as needed. For cough 120 mL 0  . levothyroxine (SYNTHROID, LEVOTHROID) 112 MCG tablet Take 1 tablet by mouth  daily 90 tablet 3  . Omega-3 350 MG CAPS Take 1 capsule by mouth daily.    Marland Kitchen omeprazole (PRILOSEC) 40 MG capsule Take 1 capsule by mouth two times daily 180 capsule 0  . psyllium (METAMUCIL) 58.6 % packet Take 1-3 packets by mouth daily.     . simvastatin (ZOCOR) 40 MG tablet Take 1 tablet by mouth  daily 90 tablet 3  . vitamin E (VITAMIN E) 400 UNIT capsule Take 400 Units by mouth daily.      No current facility-administered medications for this visit.      Review of Systems:     Cardiac Review of Systems: Y or N  Chest Pain [  n  ]  Resting SOB [ n  ] Exertional SOB  [ y ]  Vertell Limber Florencio.Farrier  ]   Pedal Edema [ n  ]    Palpitations [  n] Syncope  [  n]   Presyncope [ n  ]  General Review of Systems: [Y] = yes [  ]=no Constitional: recent weight change [  ];  Wt loss over the last 3 months [   ] anorexia [  ]; fatigue [  ]; nausea [  ]; night sweats [  ]; fever [  ]; or chills [  ];          Dental: poor dentition[  ]; Last Dentist visit:   Eye : blurred vision [  ]; diplopia [   ]; vision changes [  ];  Amaurosis fugax[  ]; Resp: cough [  ];  wheezing[  ];  hemoptysis[  ]; shortness of breath[  ]; paroxysmal nocturnal dyspnea[  ]; dyspnea on exertion[  ]; or orthopnea[  ];  GI:  gallstones[  ], vomiting[  ];  dysphagia[  ]; melena[  ];  hematochezia [  ]; heartburn[  ];   Hx of  Colonoscopy[  ]; GU: kidney  stones [  ]; hematuria[  ];   dysuria [  ];  nocturia[  ];  history of     obstruction [  ]; urinary frequency [  ]             Skin: rash, swelling[  ];, hair loss[  ];  peripheral edema[  ];  or itching[  ]; Musculosketetal: myalgias[  ];  joint swelling[  ];  joint erythema[  ];  joint pain[  ];  back pain[  ];  Heme/Lymph: bruising[  ];  bleeding[  ];  anemia[  ];  Neuro: TIA[  ];  headaches[  ];  stroke[  ];  vertigo[  ];  seizures[  ];   paresthesias[  ];  difficulty walking[  ];  Psych:depression[  ]; anxiety[  ];  Endocrine: diabetes[  ];  thyroid dysfunction[  ];  Immunizations: Flu up to date [  ]; Pneumococcal up to date [  ];  Other:  Physical Exam: BP 159/83 mmHg  Pulse 83  Resp 20  Ht 5' 6" (1.676 m)  Wt 232 lb (105.235 kg)  BMI 37.46 kg/m2  SpO2 98%  LMP 02/10/1970  PHYSICAL EXAMINATION: General appearance: alert, cooperative and appears older than stated age Head: Normocephalic, without obvious abnormality, atraumatic Neck: no adenopathy, no carotid bruit, no JVD, supple, symmetrical, trachea midline and thyroid not enlarged, symmetric, no tenderness/mass/nodules Lymph nodes: Cervical, supraclavicular, and axillary nodes normal. Resp: clear to auscultation bilaterally Back: symmetric, no curvature. ROM normal. No CVA tenderness. Cardio: regular rate and rhythm, S1, S2 normal, no murmur, click, rub or gallop GI: soft, non-tender; bowel sounds normal; no masses,  no organomegaly Extremities: extremities normal, atraumatic, no cyanosis or edema and Homans sign is negative, no sign of DVT Neurologic: Grossly normal  Diagnostic Studies & Laboratory data:     Recent Radiology Findings:   Dg Chest 2 View  10/17/2014   CLINICAL DATA:  Post mediastinal lymph node biopsy.  EXAM: CHEST  2 VIEW  COMPARISON:  10/17/2014 .  FINDINGS: Prior poor catheter stable position. Mediastinum and hilar structures are normal. The lungs are clear. No significant pleural effusion. No  pneumothorax. Right hemidiaphragm appears normal. Surgical clips left upper abdomen.  IMPRESSION: 1. Power port catheter stable position. 2. No acute cardiopulmonary disease. No pneumothorax. Previously questioned contour of the right hemidiaphragm appears normal on PA and lateral chest x-ray.   Electronically Signed   By: Marcello Moores  Register   On: 10/17/2014 10:44   Nm Pet Image Restag (ps) Skull Base To Thigh  09/27/2014   CLINICAL DATA:  Subsequent treatment strategy for Hodgkin's lymphoma.  EXAM: NUCLEAR MEDICINE PET SKULL BASE TO THIGH  TECHNIQUE: 11.5 mCi F-18 FDG was injected intravenously. Full-ring PET imaging was performed from the skull base to thigh after the radiotracer. CT data was obtained and used for attenuation correction and anatomic localization.  FASTING BLOOD GLUCOSE:  Value: 92 mg/dl  COMPARISON:  PET-CT 06/27/2014  FINDINGS: NECK  New bilateral hypermetabolic nodules within the parotid glands. Nodules measure 10 mm each and have intense metabolic activity. Again demonstrated diffuse metabolic activity in the thyroid gland suggesting thyroiditis.  CHEST  There is new hypermetabolic LEFT lower paratracheal and perihilar adenopathy with intense metabolic activity (SUV max 17.2). Interval increase in activity of previous described RIGHT hilar lymph node with SUV max equal 9.9 increase dfrom 8.0. No suspicious pulmonary nodules.  ABDOMEN/PELVIS  No abnormal hypermetabolic activity within the liver, pancreas, adrenal glands, or spleen. No hypermetabolic lymph nodes in the abdomen or pelvis.  SKELETON  Two hypermetabolic foci within the RIGHT sacral ala of with intense metabolic activity (SUV max 13.9). Subtle metabolic activity described at RIGHT sacral location on comparison PET-CT scan. Additional hypermetabolic foci within the inferior LEFT pubic ramus adjacent the acetabulum with SUV max equal 14. No clear CT correlation to these lesions.  IMPRESSION: 1. New LEFT hilar and mediastinal metabolic  adenopathy and increased metabolic activity of RIGHT hilar lymph nodes consistent with high-grade lymphoma recurrence. 2. New / increased metabolic activity in the RIGHT sacrum and  LEFT ischium consistent with skeletal metastasis. 3. New bilateral hypermetabolic nodule in the parotid glands suggests lymphoma involvement.   Electronically Signed   By: Suzy Bouchard M.D.   On: 09/27/2014 14:55   Dg Chest Port 1 View  10/17/2014   CLINICAL DATA:  Status post left-sided mediastinal TBNA, history of lymphoma.  EXAM: PORTABLE CHEST - 1 VIEW  COMPARISON:  CT images from a PET-CT scan of September 27, 2014  FINDINGS: The lungs are well-expanded. There is no definite pneumothorax or pneumomediastinum. There is straightening of the contour of the right hemidiaphragm which may be projectional though a small hydropneumothorax cannot be absolutely excluded. The heart is normal in size. The pulmonary vascularity is not engorged. There is mild tortuosity of the descending thoracic aorta. A power port appliance tip projects over the midportion of the SVC. There is mild multilevel degenerative disc space narrowing.  IMPRESSION: There is no definite postprocedure pneumothorax or pneumomediastinum. However, a PA and lateral chest x-ray is recommended to allow further assessment of the right hemidiaphragm and to exclude the presence of a small hydro- pneumothorax.   Electronically Signed   By: David  Martinique M.D.   On: 10/17/2014 09:20     I have independently reviewed the above radiologic studies.  Recent Lab Findings: Lab Results  Component Value Date   WBC 8.6 10/11/2014   HGB 12.6 10/11/2014   HCT 38.1 10/11/2014   PLT 217 10/11/2014   GLUCOSE 133* 10/11/2014   CHOL 160 09/16/2013   TRIG 147.0 09/16/2013   HDL 45.20 09/16/2013   LDLDIRECT 133.0 09/17/2012   LDLCALC 85 09/16/2013   ALT 20 09/27/2014   AST 25 09/27/2014   NA 139 10/11/2014   K 4.0 10/11/2014   CL 105 10/11/2014   CREATININE 0.74 10/11/2014    BUN 7 10/11/2014   CO2 28 10/11/2014   TSH 2.53 09/16/2013   INR 1.0 10/04/2014   HGBA1C 7.3* 09/16/2013      Assessment / Plan:    Likely recurrence of Hodgkin's disease but so far unable to obtain a tissue diagnosis. I reviewed the scans with the patient and her husband and recommended that we proceed with repeat bronchoscopy examination with ebus scope and then proceeding with formal mediastinoscopy to obtain sufficient tissue to make a diagnosis of lymphoma. Risks and options of the procedure were discussed with the patient and her husband in detail. Plan to proceed on Wednesday, September 21.     I  spent 40 minutes counseling the patient face to face and 50% or more the  time was spent in counseling and coordination of care. The total time spent in the appointment was 60 minutes.  Grace Isaac MD      [address] [place] [address] Office [phone]   Beeper [phone]  10/26/2014 4:43 PM

## 2014-10-26 NOTE — Assessment & Plan Note (Signed)
Suspect this is cause of continued rhinorrhea and congestion and post nasal drainage  Reassuring exam today  Do not feel she has a sinus infection at this time but need to watch carefully Will return to allergist when able (doubt she can get immunotx with chemo)  Will change to a different antihistamine (zyrtec) Continue flonase daily Also allergen avoidance

## 2014-10-31 ENCOUNTER — Encounter (HOSPITAL_COMMUNITY): Payer: Self-pay | Admitting: *Deleted

## 2014-10-31 MED ORDER — VANCOMYCIN HCL 10 G IV SOLR
1500.0000 mg | INTRAVENOUS | Status: AC
  Administered 2014-11-01: 1500 mg via INTRAVENOUS
  Filled 2014-10-31: qty 1500

## 2014-10-31 NOTE — Progress Notes (Signed)
Denies cardiac history, chest pain or sob.

## 2014-11-01 ENCOUNTER — Ambulatory Visit (HOSPITAL_COMMUNITY): Payer: Medicare Other

## 2014-11-01 ENCOUNTER — Ambulatory Visit (HOSPITAL_COMMUNITY): Payer: Medicare Other | Admitting: Certified Registered"

## 2014-11-01 ENCOUNTER — Encounter (HOSPITAL_COMMUNITY): Payer: Self-pay | Admitting: *Deleted

## 2014-11-01 ENCOUNTER — Encounter (HOSPITAL_COMMUNITY)
Admission: RE | Disposition: A | Discharge status: Home / Self Care | Payer: Self-pay | Source: Ambulatory Visit | Attending: Cardiothoracic Surgery

## 2014-11-01 ENCOUNTER — Ambulatory Visit (HOSPITAL_COMMUNITY)
Admission: RE | Admit: 2014-11-01 | Discharge: 2014-11-01 | Disposition: A | Discharge status: Home / Self Care | Payer: Medicare Other | Source: Ambulatory Visit | Attending: Cardiothoracic Surgery | Admitting: Cardiothoracic Surgery

## 2014-11-01 DIAGNOSIS — I739 Peripheral vascular disease, unspecified: Secondary | ICD-10-CM | POA: Diagnosis not present

## 2014-11-01 DIAGNOSIS — D649 Anemia, unspecified: Secondary | ICD-10-CM | POA: Diagnosis not present

## 2014-11-01 DIAGNOSIS — C8199 Hodgkin lymphoma, unspecified, extranodal and solid organ sites: Secondary | ICD-10-CM | POA: Insufficient documentation

## 2014-11-01 DIAGNOSIS — I888 Other nonspecific lymphadenitis: Secondary | ICD-10-CM | POA: Diagnosis not present

## 2014-11-01 DIAGNOSIS — C8590 Non-Hodgkin lymphoma, unspecified, unspecified site: Secondary | ICD-10-CM | POA: Diagnosis not present

## 2014-11-01 DIAGNOSIS — Z85038 Personal history of other malignant neoplasm of large intestine: Secondary | ICD-10-CM | POA: Insufficient documentation

## 2014-11-01 DIAGNOSIS — C8190 Hodgkin lymphoma, unspecified, unspecified site: Secondary | ICD-10-CM

## 2014-11-01 DIAGNOSIS — Z9889 Other specified postprocedural states: Secondary | ICD-10-CM | POA: Diagnosis not present

## 2014-11-01 DIAGNOSIS — E039 Hypothyroidism, unspecified: Secondary | ICD-10-CM | POA: Diagnosis not present

## 2014-11-01 DIAGNOSIS — Z01818 Encounter for other preprocedural examination: Secondary | ICD-10-CM | POA: Diagnosis not present

## 2014-11-01 HISTORY — PX: LYMPH NODE BIOPSY: SHX201

## 2014-11-01 HISTORY — DX: Pneumonia, unspecified organism: J18.9

## 2014-11-01 HISTORY — PX: VIDEO BRONCHOSCOPY WITH ENDOBRONCHIAL ULTRASOUND: SHX6177

## 2014-11-01 HISTORY — PX: MEDIASTINOSCOPY: SHX5086

## 2014-11-01 LAB — CBC
HCT: 39.8 % (ref 36.0–46.0)
Hemoglobin: 13.2 g/dL (ref 12.0–15.0)
MCH: 29.9 pg (ref 26.0–34.0)
MCHC: 33.2 g/dL (ref 30.0–36.0)
MCV: 90 fL (ref 78.0–100.0)
Platelets: 226 10*3/uL (ref 150–400)
RBC: 4.42 MIL/uL (ref 3.87–5.11)
RDW: 14.8 % (ref 11.5–15.5)
WBC: 8.1 10*3/uL (ref 4.0–10.5)

## 2014-11-01 LAB — COMPREHENSIVE METABOLIC PANEL
ALT: 22 U/L (ref 14–54)
AST: 29 U/L (ref 15–41)
Albumin: 4 g/dL (ref 3.5–5.0)
Alkaline Phosphatase: 123 U/L (ref 38–126)
Anion gap: 8 (ref 5–15)
BUN: 11 mg/dL (ref 6–20)
CO2: 28 mmol/L (ref 22–32)
Calcium: 9.4 mg/dL (ref 8.9–10.3)
Chloride: 104 mmol/L (ref 101–111)
Creatinine, Ser: 0.77 mg/dL (ref 0.44–1.00)
GFR calc Af Amer: >60 mL/min (ref >60)
GFR calc non Af Amer: >60 mL/min (ref >60)
Glucose, Bld: 96 mg/dL (ref 65–99)
Potassium: 3.8 mmol/L (ref 3.5–5.1)
Sodium: 140 mmol/L (ref 135–145)
Total Bilirubin: 0.5 mg/dL (ref 0.3–1.2)
Total Protein: 6.4 g/dL — ABNORMAL LOW (ref 6.5–8.1)

## 2014-11-01 LAB — SURGICAL PCR SCREEN
MRSA, PCR: NEGATIVE
Staphylococcus aureus: NEGATIVE

## 2014-11-01 LAB — PROTIME-INR
INR: 1.05 (ref 0.00–1.49)
Prothrombin Time: 13.9 seconds (ref 11.6–15.2)

## 2014-11-01 LAB — APTT: aPTT: 25 seconds (ref 24–37)

## 2014-11-01 SURGERY — BRONCHOSCOPY, WITH EBUS
Anesthesia: General

## 2014-11-01 MED ORDER — ONDANSETRON HCL 4 MG/2ML IJ SOLN
INTRAMUSCULAR | Status: DC | PRN
  Administered 2014-11-01: 4 mg via INTRAVENOUS

## 2014-11-01 MED ORDER — GLYCOPYRROLATE 0.2 MG/ML IJ SOLN
INTRAMUSCULAR | Status: AC
  Filled 2014-11-01: qty 2

## 2014-11-01 MED ORDER — FENTANYL CITRATE (PF) 100 MCG/2ML IJ SOLN
INTRAMUSCULAR | Status: AC
  Filled 2014-11-01: qty 2

## 2014-11-01 MED ORDER — ROCURONIUM BROMIDE 100 MG/10ML IV SOLN
INTRAVENOUS | Status: DC | PRN
  Administered 2014-11-01: 40 mg via INTRAVENOUS
  Administered 2014-11-01: 10 mg via INTRAVENOUS

## 2014-11-01 MED ORDER — ONDANSETRON HCL 4 MG/2ML IJ SOLN
INTRAMUSCULAR | Status: AC
  Filled 2014-11-01: qty 2

## 2014-11-01 MED ORDER — PROPOFOL 10 MG/ML IV BOLUS
INTRAVENOUS | Status: DC | PRN
  Administered 2014-11-01: 135 mg via INTRAVENOUS

## 2014-11-01 MED ORDER — MUPIROCIN 2 % EX OINT
1.0000 "application " | TOPICAL_OINTMENT | Freq: Once | CUTANEOUS | Status: AC
  Administered 2014-11-01: 1 via TOPICAL
  Filled 2014-11-01: qty 22

## 2014-11-01 MED ORDER — SODIUM CHLORIDE 0.9 % IJ SOLN
INTRAMUSCULAR | Status: AC
  Filled 2014-11-01: qty 10

## 2014-11-01 MED ORDER — SUCCINYLCHOLINE CHLORIDE 20 MG/ML IJ SOLN
INTRAMUSCULAR | Status: AC
  Filled 2014-11-01: qty 1

## 2014-11-01 MED ORDER — MUPIROCIN 2 % EX OINT: 1.0000 "application " | TOPICAL_OINTMENT | Freq: Once | CUTANEOUS | Status: DC

## 2014-11-01 MED ORDER — PHENYLEPHRINE 40 MCG/ML (10ML) SYRINGE FOR IV PUSH (FOR BLOOD PRESSURE SUPPORT)
PREFILLED_SYRINGE | INTRAVENOUS | Status: AC
  Filled 2014-11-01: qty 10

## 2014-11-01 MED ORDER — 0.9 % SODIUM CHLORIDE (POUR BTL) OPTIME
TOPICAL | Status: DC | PRN
  Administered 2014-11-01: 1000 mL

## 2014-11-01 MED ORDER — ONDANSETRON HCL 4 MG/2ML IJ SOLN
4.0000 mg | Freq: Once | INTRAMUSCULAR | Status: AC | PRN
Start: 2014-11-01 — End: 2014-11-01
  Administered 2014-11-01: 4 mg via INTRAVENOUS

## 2014-11-01 MED ORDER — PHENYLEPHRINE HCL 10 MG/ML IJ SOLN
INTRAMUSCULAR | Status: DC | PRN
  Administered 2014-11-01: 80 ug via INTRAVENOUS

## 2014-11-01 MED ORDER — NEOSTIGMINE METHYLSULFATE 10 MG/10ML IV SOLN
INTRAVENOUS | Status: DC | PRN
  Administered 2014-11-01: 3 mg via INTRAVENOUS

## 2014-11-01 MED ORDER — SUFENTANIL CITRATE 50 MCG/ML IV SOLN
INTRAVENOUS | Status: DC | PRN
  Administered 2014-11-01: 15 ug via INTRAVENOUS
  Administered 2014-11-01: 10 ug via INTRAVENOUS

## 2014-11-01 MED ORDER — GLYCOPYRROLATE 0.2 MG/ML IJ SOLN
INTRAMUSCULAR | Status: DC | PRN
  Administered 2014-11-01: 0.4 mg via INTRAVENOUS

## 2014-11-01 MED ORDER — CHLORHEXIDINE GLUCONATE CLOTH 2 % EX PADS: 6.0000 | MEDICATED_PAD | Freq: Once | CUTANEOUS | Status: DC

## 2014-11-01 MED ORDER — ROCURONIUM BROMIDE 50 MG/5ML IV SOLN
INTRAVENOUS | Status: AC
  Filled 2014-11-01: qty 1

## 2014-11-01 MED ORDER — SUFENTANIL CITRATE 50 MCG/ML IV SOLN
INTRAVENOUS | Status: AC
  Filled 2014-11-01: qty 1

## 2014-11-01 MED ORDER — FENTANYL CITRATE (PF) 100 MCG/2ML IJ SOLN
25.0000 ug | INTRAMUSCULAR | Status: DC | PRN
  Administered 2014-11-01 (×2): 25 ug via INTRAVENOUS
  Administered 2014-11-01: 50 ug via INTRAVENOUS

## 2014-11-01 MED ORDER — MIDAZOLAM HCL 5 MG/5ML IJ SOLN
INTRAMUSCULAR | Status: DC | PRN
  Administered 2014-11-01: 2 mg via INTRAVENOUS

## 2014-11-01 MED ORDER — SODIUM CHLORIDE 0.9 % IR SOLN
Status: DC | PRN
  Administered 2014-11-01: 250 mL

## 2014-11-01 MED ORDER — HEMOSTATIC AGENTS (NO CHARGE) OPTIME
TOPICAL | Status: DC | PRN
  Administered 2014-11-01: 1 via TOPICAL

## 2014-11-01 MED ORDER — PROPOFOL 10 MG/ML IV BOLUS
INTRAVENOUS | Status: AC
  Filled 2014-11-01: qty 20

## 2014-11-01 MED ORDER — NEOSTIGMINE METHYLSULFATE 10 MG/10ML IV SOLN
INTRAVENOUS | Status: AC
  Filled 2014-11-01: qty 1

## 2014-11-01 MED ORDER — LIDOCAINE HCL (CARDIAC) 20 MG/ML IV SOLN
INTRAVENOUS | Status: AC
  Filled 2014-11-01: qty 5

## 2014-11-01 MED ORDER — LIDOCAINE HCL (CARDIAC) 20 MG/ML IV SOLN
INTRAVENOUS | Status: DC | PRN
  Administered 2014-11-01: 50 mg via INTRAVENOUS

## 2014-11-01 MED ORDER — LACTATED RINGERS IV SOLN
INTRAVENOUS | Status: DC | PRN
  Administered 2014-11-01: 08:00:00 via INTRAVENOUS

## 2014-11-01 MED ORDER — MIDAZOLAM HCL 2 MG/2ML IJ SOLN
INTRAMUSCULAR | Status: AC
  Filled 2014-11-01: qty 4

## 2014-11-01 MED ORDER — EPINEPHRINE HCL 1 MG/ML IJ SOLN
INTRAMUSCULAR | Status: AC
  Filled 2014-11-01: qty 1

## 2014-11-01 SURGICAL SUPPLY — 56 items
ADH SKN CLS APL DERMABOND .7 (GAUZE/BANDAGES/DRESSINGS) ×1
BLADE SURG 10 STRL SS (BLADE) ×2 IMPLANT
BRUSH CYTOL CELLEBRITY 1.5X140 (MISCELLANEOUS) IMPLANT
CANISTER SUCTION 2500CC (MISCELLANEOUS) ×4 IMPLANT
CLIP TI MEDIUM 6 (CLIP) IMPLANT
CONT SPEC 4OZ CLIKSEAL STRL BL (MISCELLANEOUS) ×6 IMPLANT
COVER DOME SNAP 22 D (MISCELLANEOUS) ×2 IMPLANT
COVER SURGICAL LIGHT HANDLE (MISCELLANEOUS) ×4 IMPLANT
COVER TABLE BACK 60X90 (DRAPES) ×2 IMPLANT
DERMABOND ADVANCED (GAUZE/BANDAGES/DRESSINGS) ×1
DERMABOND ADVANCED .7 DNX12 (GAUZE/BANDAGES/DRESSINGS) ×1 IMPLANT
DRAPE LAPAROTOMY T 102X78X121 (DRAPES) ×2 IMPLANT
DRSG AQUACEL AG ADV 3.5X14 (GAUZE/BANDAGES/DRESSINGS) ×2 IMPLANT
ELECT CAUTERY BLADE 6.4 (BLADE) ×2 IMPLANT
ELECT REM PT RETURN 9FT ADLT (ELECTROSURGICAL) ×2
ELECTRODE REM PT RTRN 9FT ADLT (ELECTROSURGICAL) ×1 IMPLANT
FORCEPS BIOP RJ4 1.8 (CUTTING FORCEPS) IMPLANT
GAUZE SPONGE 4X4 12PLY STRL (GAUZE/BANDAGES/DRESSINGS) ×4 IMPLANT
GAUZE SPONGE 4X4 16PLY XRAY LF (GAUZE/BANDAGES/DRESSINGS) ×2 IMPLANT
GLOVE BIO SURGEON STRL SZ 6.5 (GLOVE) ×5 IMPLANT
GLOVE BIOGEL PI IND STRL 6.5 (GLOVE) IMPLANT
GLOVE BIOGEL PI INDICATOR 6.5 (GLOVE) ×1
GOWN STRL REUS W/ TWL LRG LVL3 (GOWN DISPOSABLE) ×1 IMPLANT
GOWN STRL REUS W/TWL LRG LVL3 (GOWN DISPOSABLE) ×2
HEMOSTAT SURGICEL 2X14 (HEMOSTASIS) IMPLANT
KIT BASIN OR (CUSTOM PROCEDURE TRAY) ×2 IMPLANT
KIT CLEAN ENDO COMPLIANCE (KITS) ×4 IMPLANT
KIT ROOM TURNOVER OR (KITS) ×4 IMPLANT
MARKER SKIN DUAL TIP RULER LAB (MISCELLANEOUS) ×2 IMPLANT
NDL BIOPSY TRANSBRONCH 21G (NEEDLE) IMPLANT
NEEDLE BIOPSY TRANSBRONCH 21G (NEEDLE) IMPLANT
NEEDLE BLUNT 18X1 FOR OR ONLY (NEEDLE) IMPLANT
NEEDLE SONO TIP II EBUS (NEEDLE) ×2 IMPLANT
NS IRRIG 1000ML POUR BTL (IV SOLUTION) ×4 IMPLANT
OIL SILICONE PENTAX (PARTS (SERVICE/REPAIRS)) ×2 IMPLANT
PACK SURGICAL SETUP 50X90 (CUSTOM PROCEDURE TRAY) ×2 IMPLANT
PAD ARMBOARD 7.5X6 YLW CONV (MISCELLANEOUS) ×8 IMPLANT
PENCIL BUTTON HOLSTER BLD 10FT (ELECTRODE) ×2 IMPLANT
SOLUTION ANTI FOG 6CC (MISCELLANEOUS) ×1 IMPLANT
SPONGE GAUZE 4X4 12PLY STER LF (GAUZE/BANDAGES/DRESSINGS) ×1 IMPLANT
SPONGE INTESTINAL PEANUT (DISPOSABLE) IMPLANT
STAPLER VISISTAT 35W (STAPLE) IMPLANT
SUT VIC AB 3-0 SH 18 (SUTURE) ×2 IMPLANT
SUT VICRYL 4-0 PS2 18IN ABS (SUTURE) ×2 IMPLANT
SWAB COLLECTION DEVICE MRSA (MISCELLANEOUS) IMPLANT
SYR 20CC LL (SYRINGE) ×2 IMPLANT
SYR 20ML ECCENTRIC (SYRINGE) ×2 IMPLANT
SYRINGE 10CC LL (SYRINGE) ×2 IMPLANT
TAPE CLOTH SURG 4X10 WHT LF (GAUZE/BANDAGES/DRESSINGS) ×1 IMPLANT
TOWEL OR 17X24 6PK STRL BLUE (TOWEL DISPOSABLE) ×4 IMPLANT
TOWEL OR 17X26 10 PK STRL BLUE (TOWEL DISPOSABLE) ×2 IMPLANT
TRAP SPECIMEN MUCOUS 40CC (MISCELLANEOUS) ×3 IMPLANT
TUBE ANAEROBIC SPECIMEN COL (MISCELLANEOUS) IMPLANT
TUBE CONNECTING 12X1/4 (SUCTIONS) ×3 IMPLANT
TUBE CONNECTING 20X1/4 (TUBING) ×2 IMPLANT
WATER STERILE IRR 1000ML POUR (IV SOLUTION) ×2 IMPLANT

## 2014-11-01 NOTE — Progress Notes (Signed)
Dr Al Decant per lab request asked him if 2 units of rbc's will be enough due to the pt having antibodies. Also will pt need radiated products due to Hodgkins lymphoma - Dr Servando Snare states no. Lab called and informed.- Spoke with Hinton Dyer

## 2014-11-01 NOTE — Anesthesia Preprocedure Evaluation (Addendum)
Anesthesia Evaluation  Patient identified by MRN, date of birth, ID band Patient awake    Reviewed: Allergy & Precautions, NPO status , Patient's Chart, lab work & pertinent test results  Airway Mallampati: II  TM Distance: >3 FB Neck ROM: Full    Dental  (+) Teeth Intact, Dental Advisory Given   Pulmonary    breath sounds clear to auscultation       Cardiovascular  Rhythm:Regular Rate:Normal     Neuro/Psych Anxiety Depression    GI/Hepatic   Endo/Other    Renal/GU      Musculoskeletal   Abdominal   Peds  Hematology   Anesthesia Other Findings   Reproductive/Obstetrics                            Anesthesia Physical Anesthesia Plan  ASA: II  Anesthesia Plan: General   Post-op Pain Management:    Induction: Intravenous  Airway Management Planned: Oral ETT  Additional Equipment: None  Intra-op Plan:   Post-operative Plan: Extubation in OR  Informed Consent: I have reviewed the patients History and Physical, chart, labs and discussed the procedure including the risks, benefits and alternatives for the proposed anesthesia with the patient or authorized representative who has indicated his/her understanding and acceptance.   Dental advisory given  Plan Discussed with: CRNA, Anesthesiologist and Surgeon  Anesthesia Plan Comments:         Anesthesia Quick Evaluation

## 2014-11-01 NOTE — Brief Op Note (Signed)
      [address]       [place] [address]             [phone]      11/01/2014  10:29 AM  PATIENT:  Diamond Boyd  74 y.o. female  PRE-OPERATIVE DIAGNOSIS: Poss  LYMPHOMA  POST-OPERATIVE DIAGNOSIS:  Poss LYMPHOMA  PROCEDURE:  Procedure(s): VIDEO BRONCHOSCOPY WITH ENDOBRONCHIAL ULTRASOUND (N/A) MEDIASTINOSCOPY (N/A) MEDIASTINAL LYMPH NODE BIOPSY (N/A)  SURGEON:  Surgeon(s) and Role:    * Grace Isaac, MD - Primary    ANESTHESIA:   general  EBL:  Total I/O In: 600 [I.V.:600] Out: -   BLOOD ADMINISTERED:none  DRAINS: none   LOCAL MEDICATIONS USED:  NONE  SPECIMEN:  Source of Specimen:  mediastinal lymph nodes   DISPOSITION OF SPECIMEN:  PATHOLOGY  COUNTS:  YES   DICTATION: .Dragon Dictation  PLAN OF CARE: Discharge to home after PACU  PATIENT DISPOSITION:  PACU - hemodynamically stable.   Delay start of Pharmacological VTE agent (>24hrs) due to surgical blood loss or risk of bleeding: yes

## 2014-11-01 NOTE — Anesthesia Postprocedure Evaluation (Signed)
  Anesthesia Post-op Note  Patient: Diamond Boyd  Procedure(s) Performed: Procedure(s): VIDEO BRONCHOSCOPY WITH ENDOBRONCHIAL ULTRASOUND (N/A) MEDIASTINOSCOPY (N/A) MEDIASTINAL LYMPH NODE BIOPSY (N/A)  Patient Location: PACU  Anesthesia Type:General  Level of Consciousness: awake, alert  and oriented  Airway and Oxygen Therapy: Patient Spontanous Breathing and Patient connected to nasal cannula oxygen  Post-op Pain: mild  Post-op Assessment: Post-op Vital signs reviewed, Patient's Cardiovascular Status Stable, Respiratory Function Stable, Patent Airway and Pain level controlled              Post-op Vital Signs: stable  Last Vitals:  Filed Vitals:   11/01/14 1212  BP: 136/73  Pulse: 68  Temp:   Resp:     Complications: No apparent anesthesia complications

## 2014-11-01 NOTE — Discharge Instructions (Signed)
Flexible Bronchoscopy, Care After Refer to this sheet in the next few weeks. These instructions provide you with information on caring for yourself after your procedure. Your health care provider may also give you more specific instructions. Your treatment has been planned according to current medical practices, but problems sometimes occur. Call your health care provider if you have any problems or questions after your procedure.  WHAT TO EXPECT AFTER THE PROCEDURE It is normal to have the following symptoms for 24-48 hours after the procedure:   Increased cough.  Low-grade fever.  Sore throat or hoarse voice.  Small streaks of blood in your thick spit (sputum) if tissue samples were taken (biopsy). HOME CARE INSTRUCTIONS   Do not eat or drink anything for 2 hours after your procedure. Your nose and throat were numbed by medicine. If you try to eat or drink before the medicine wears off, food or drink could go into your lungs or you could burn yourself. After the numbness is gone and your cough and gag reflexes have returned, you may eat soft food and drink liquids slowly.   The day after the procedure, you can go back to your normal diet.   You may resume normal activities.   Keep all follow-up visits as directed by your health care provider. It is important to keep all your appointments, especially if tissue samples were taken for testing (biopsy). SEEK IMMEDIATE MEDICAL CARE IF:   You have increasing shortness of breath.   You become light-headed or faint.   You have chest pain.   You have any new concerning symptoms.  You cough up more than a small amount of blood.  The amount of blood you cough up increases. MAKE SURE YOU:  Understand these instructions.  Will watch your condition.  Will get help right away if you are not doing well or get worse. Document Released: 08/16/2004 Document Revised: 06/13/2013 Document Reviewed: 10/01/2012 [employer] Patient Information  2015 [place], [place].    HOME CARE INSTRUCTIONS  For Wound Adhesive:  You may briefly wet your wound in the shower or bath. Do not soak or scrub the wound. Do not swim. Avoid periods of heavy sweating until the skin adhesive has fallen off on its own. After showering or bathing, gently pat the wound dry with a clean towel.   Do not apply liquid medicine, cream medicine, ointment medicine, or makeup to your wound while the skin adhesive is in place. This may loosen the film before your wound is healed.   If a dressing is placed over the wound, be careful not to apply tape directly over the skin adhesive. This may cause the adhesive to be pulled off before the wound is healed.   Avoid prolonged exposure to sunlight or tanning lamps while the skin adhesive is in place.  The skin adhesive will usually remain in place for 5-10 days, then naturally fall off the skin. Do not pick at the adhesive film.  After Healing: Once the wound has healed, cover the wound with sunscreen during the day for 1 full year. This can help minimize scarring. Exposure to ultraviolet light in the first year will darken the scar. It can take 1-2 years for the scar to lose its redness and to heal completely.  SEEK IMMEDIATE MEDICAL CARE IF:  You have redness, pain, or swelling around the wound.   You see ayellowish-white fluid (pus) coming from the wound.   You have chills or a fever.  MAKE SURE YOU:  Understand these instructions.  Will watch your condition.  Will get help right away if you are not doing well or get worse.

## 2014-11-01 NOTE — Transfer of Care (Signed)
Immediate Anesthesia Transfer of Care Note  Patient: Diamond Boyd  Procedure(s) Performed: Procedure(s): VIDEO BRONCHOSCOPY WITH ENDOBRONCHIAL ULTRASOUND (N/A) MEDIASTINOSCOPY (N/A) MEDIASTINAL LYMPH NODE BIOPSY (N/A)  Patient Location: PACU  Anesthesia Type:General  Level of Consciousness: alert , oriented and patient cooperative  Airway & Oxygen Therapy: Patient Spontanous Breathing and Patient connected to nasal cannula oxygen  Post-op Assessment: Report given to RN, Post -op Vital signs reviewed and stable and Patient moving all extremities  Post vital signs: Reviewed and stable  Last Vitals:  Filed Vitals:   11/01/14 0724  BP: 151/57  Pulse: 77  Temp: 37.1 C  Resp: 20    Complications: No apparent anesthesia complications

## 2014-11-01 NOTE — H&P (Signed)
[address]       [place] [address]             [phone]                    Annjanette T Drahos Anderson Medical Record #[id number] Date of Birth: 1940/06/30  Referring:Gorsuch Primary Care: [name], MD  Chief Complaint:    Lymphoma   History of Present Illness:    Diamond Boyd 74 y.o. female is seen in the office after multiple other attempts at biopsy have been unsuccessful. The patient has a history of known classic Hodgkin's disease most recently biopsied from a right lower lobe lung mass and subsequently treated. Follow-up scans recently have showed increasing mediastinal hypermetabolic activity. Attempt at ebus of a 10 R node was unsuccessful in obtaining a tissue diagnosis earlier this week by the pulmonary division. Patient is referred for consideration of mediastinoscopy.     Current Activity/ Functional Status:  Patient is independent with mobility/ambulation, transfers, ADL's, IADL's.   Zubrod Score: At the time of surgery this patient's most appropriate activity status/level should be described as: []     0    Normal activity, no symptoms [x]     1    Restricted in physical strenuous activity but ambulatory, able to do out light work []     2    Ambulatory and capable of self care, unable to do work activities, up and about               >50 % of waking hours                              []     3    Only limited self care, in bed greater than 50% of waking hours []     4    Completely disabled, no self care, confined to bed or chair []     5    Moribund   Past Medical History  Diagnosis Date  . Colon polyps   . Depression   . Hypothyroidism   . Osteoarthritis     hands  . Peripheral vascular disease   . Degenerative disk disease     spine in center in past   . 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 Cancer     skin cancer- basal cell on arm / colon 2011  . Lymphoma   . Colon cancer     08-2009  . 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 Cancer     colon CA and lung CA  . Heart murmur   . GERD (gastroesophageal reflux disease)   . Anemia     hx blood tx  . Pneumonia     Past Surgical History  Procedure Laterality Date  . Cholecystectomy    . Splenectomy      lymphoma  . Breast biopsy  1996  . Plantar fascia release    . Ventral hernia repair  1998  . Tendon release      Right thumb   . Colectomy  8/11  . Vaginal hysterectomy    . Bone marrow biopsy  05/27/13  . Cataract extraction, bilateral  2016  . Port-a-cath insertion    . Colonoscopy w/ biopsies    . Dg biopsy lung    . Video bronchoscopy with endobronchial ultrasound N/A 10/17/2014    Procedure: VIDEO BRONCHOSCOPY WITH  ENDOBRONCHIAL ULTRASOUND;  Surgeon: Javier Glazier, MD;  Location: [facility] OR;  Service: Thoracic;  Laterality: N/A;    Family History  Problem Relation Age of Onset  . Coronary artery disease Mother   . Alcohol abuse Mother   . Diabetes Mother   . Cancer Father     brain  . Diabetes Brother   . Fibromyalgia Daughter     chronic pain   . COPD Daughter   . Colon cancer Neg Hx   . Colon polyps Neg Hx   . Stomach cancer Neg Hx   . Rectal cancer Neg Hx   . Ulcerative colitis Neg Hx   . Crohn's disease Neg Hx   . Asthma Daughter   . Rheum arthritis Brother   . Clotting disorder Brother     Social History   Social History  . Marital Status: Married    Spouse Name: N/A  . Number of Children: 2  . Years of Education: N/A   Occupational History  . Retired     Social History Main Topics  . Smoking status: Never Smoker   . Smokeless tobacco: Never Used     Comment: Second-hand exposure through father.  . Alcohol Use: No  . Drug Use: No  . Sexual Activity: Not on file   Other Topics Concern  . Not on file   Social History Narrative   Originally from [place]. Always lived in [place]. Previously has traveled to [facility], [place] & up [employer] to [place]. No international travel. No pets currently. Remote  parakeet exposure with her children. 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        History  Smoking status  . Never Smoker   Smokeless tobacco  . Never Used    Comment: Second-hand exposure through father.    History  Alcohol Use No     Allergies  Allergen Reactions  . Achromycin [Tetracycline Hcl]     Pt does not remember reaction  . Allopurinol     REACTION: Unsure of reaction happene years ago  . Astelin [Azelastine Hcl]     Unknown  . Cephalexin     REACTION: unsure of reaction happened yrs ago.  . Codeine Other (See Comments)    REACTION: abd. pain  . Meloxicam Other (See Comments)    REACTION: GI symptoms  . Minocycline Other (See Comments)    Abdominal pain  . Nabumetone     REACTION: reaction not known  . Nyquil [Pseudoeph-Doxylamine-Dm-Apap] Hives  . Penicillins     REACTION: reaction not known  . Sulfa Antibiotics     Gi side eff   . Zolpidem Tartrate     REACTION: feels too drugged  . Buspar [Buspirone Hcl]     Dizziness, and not as effective for anxiety  . Ciprofloxacin Rash    Current Facility-Administered Medications  Medication Dose Route Frequency Provider Last Rate Last Dose  . Chlorhexidine Gluconate Cloth 2 % PADS 6 each  6 each Topical Once Grace Isaac, MD      . vancomycin (VANCOCIN) 1,500 mg in sodium chloride 0.9 % 500 mL IVPB  1,500 mg Intravenous On Call to Petros, MD          Review of Systems:     Cardiac Review of Systems: Y or N  Chest Pain [  n  ]  Resting SOB [ n  ] Exertional SOB  [ y ]  Cardell Peach  ]  Pedal Edema [ n  ]    Palpitations [  n] Syncope  [  n]   Presyncope [ n  ]  General Review of Systems: [Y] = yes [  ]=no Constitional: recent weight change [  ];  Wt loss over the last 3 months [   ] anorexia [  ]; fatigue [  ]; nausea [  ]; night sweats [  ]; fever [  ]; or chills [  ];          Dental: poor dentition[  ]; Last Dentist visit:   Eye : blurred vision [  ]; diplopia [    ]; vision changes [  ];  Amaurosis fugax[  ]; Resp: cough [  ];  wheezing[  ];  hemoptysis[  ]; shortness of breath[  ]; paroxysmal nocturnal dyspnea[  ]; dyspnea on exertion[  ]; or orthopnea[  ];  GI:  gallstones[  ], vomiting[  ];  dysphagia[  ]; melena[  ];  hematochezia [  ]; heartburn[  ];   Hx of  Colonoscopy[  ]; GU: kidney stones [  ]; hematuria[  ];   dysuria [  ];  nocturia[  ];  history of     obstruction [  ]; urinary frequency [  ]             Skin: rash, swelling[  ];, hair loss[  ];  peripheral edema[  ];  or itching[  ]; Musculosketetal: myalgias[  ];  joint swelling[  ];  joint erythema[  ];  joint pain[  ];  back pain[  ];  Heme/Lymph: bruising[  ];  bleeding[  ];  anemia[  ];  Neuro: TIA[  ];  headaches[  ];  stroke[  ];  vertigo[  ];  seizures[  ];   paresthesias[  ];  difficulty walking[  ];  Psych:depression[  ]; anxiety[  ];  Endocrine: diabetes[  ];  thyroid dysfunction[  ];  Immunizations: Flu up to date [  ]; Pneumococcal up to date [  ];  Other:  Physical Exam: BP 151/57 mmHg  Pulse 77  Temp(Src) 98.8 F (37.1 C) (Oral)  Resp 20  Ht 5' 6"  (1.676 m)  Wt 232 lb (105.235 kg)  BMI 37.46 kg/m2  SpO2 98%  LMP 02/10/1970  PHYSICAL EXAMINATION: General appearance: alert, cooperative and appears older than stated age Head: Normocephalic, without obvious abnormality, atraumatic Neck: no adenopathy, no carotid bruit, no JVD, supple, symmetrical, trachea midline and thyroid not enlarged, symmetric, no tenderness/mass/nodules Lymph nodes: Cervical, supraclavicular, and axillary nodes normal. Resp: clear to auscultation bilaterally Back: symmetric, no curvature. ROM normal. No CVA tenderness. Cardio: regular rate and rhythm, S1, S2 normal, no murmur, click, rub or gallop GI: soft, non-tender; bowel sounds normal; no masses,  no organomegaly Extremities: extremities normal, atraumatic, no cyanosis or edema and Homans sign is negative, no sign of DVT Neurologic:  Grossly normal  Diagnostic Studies & Laboratory data:     Recent Radiology Findings:   Dg Chest 2 View  10/17/2014   CLINICAL DATA:  Post mediastinal lymph node biopsy.  EXAM: CHEST  2 VIEW  COMPARISON:  10/17/2014 .  FINDINGS: Prior poor catheter stable position. Mediastinum and hilar structures are normal. The lungs are clear. No significant pleural effusion. No pneumothorax. Right hemidiaphragm appears normal. Surgical clips left upper abdomen.  IMPRESSION: 1. Power port catheter stable position. 2. No acute cardiopulmonary disease. No pneumothorax. Previously questioned contour of the right hemidiaphragm appears  normal on PA and lateral chest x-ray.   Electronically Signed   By: Marcello Moores  Register   On: 10/17/2014 10:44   Nm Pet Image Restag (ps) Skull Base To Thigh  09/27/2014   CLINICAL DATA:  Subsequent treatment strategy for Hodgkin's lymphoma.  EXAM: NUCLEAR MEDICINE PET SKULL BASE TO THIGH  TECHNIQUE: 11.5 mCi F-18 FDG was injected intravenously. Full-ring PET imaging was performed from the skull base to thigh after the radiotracer. CT data was obtained and used for attenuation correction and anatomic localization.  FASTING BLOOD GLUCOSE:  Value: 92 mg/dl  COMPARISON:  PET-CT 06/27/2014  FINDINGS: NECK  New bilateral hypermetabolic nodules within the parotid glands. Nodules measure 10 mm each and have intense metabolic activity. Again demonstrated diffuse metabolic activity in the thyroid gland suggesting thyroiditis.  CHEST  There is new hypermetabolic LEFT lower paratracheal and perihilar adenopathy with intense metabolic activity (SUV max 17.2). Interval increase in activity of previous described RIGHT hilar lymph node with SUV max equal 9.9 increase dfrom 8.0. No suspicious pulmonary nodules.  ABDOMEN/PELVIS  No abnormal hypermetabolic activity within the liver, pancreas, adrenal glands, or spleen. No hypermetabolic lymph nodes in the abdomen or pelvis.  SKELETON  Two hypermetabolic foci within  the RIGHT sacral ala of with intense metabolic activity (SUV max 13.9). Subtle metabolic activity described at RIGHT sacral location on comparison PET-CT scan. Additional hypermetabolic foci within the inferior LEFT pubic ramus adjacent the acetabulum with SUV max equal 14. No clear CT correlation to these lesions.  IMPRESSION: 1. New LEFT hilar and mediastinal metabolic adenopathy and increased metabolic activity of RIGHT hilar lymph nodes consistent with high-grade lymphoma recurrence. 2. New / increased metabolic activity in the RIGHT sacrum and LEFT ischium consistent with skeletal metastasis. 3. New bilateral hypermetabolic nodule in the parotid glands suggests lymphoma involvement.   Electronically Signed   By: Suzy Bouchard M.D.   On: 09/27/2014 14:55   Dg Chest Port 1 View  10/17/2014   CLINICAL DATA:  Status post left-sided mediastinal TBNA, history of lymphoma.  EXAM: PORTABLE CHEST - 1 VIEW  COMPARISON:  CT images from a PET-CT scan of September 27, 2014  FINDINGS: The lungs are well-expanded. There is no definite pneumothorax or pneumomediastinum. There is straightening of the contour of the right hemidiaphragm which may be projectional though a small hydropneumothorax cannot be absolutely excluded. The heart is normal in size. The pulmonary vascularity is not engorged. There is mild tortuosity of the descending thoracic aorta. A power port appliance tip projects over the midportion of the SVC. There is mild multilevel degenerative disc space narrowing.  IMPRESSION: There is no definite postprocedure pneumothorax or pneumomediastinum. However, a PA and lateral chest x-ray is recommended to allow further assessment of the right hemidiaphragm and to exclude the presence of a small hydro- pneumothorax.   Electronically Signed   By: David  Martinique M.D.   On: 10/17/2014 09:20     I have independently reviewed the above radiologic studies.  Recent Lab Findings: Lab Results  Component Value Date   WBC  8.6 10/11/2014   HGB 12.6 10/11/2014   HCT 38.1 10/11/2014   PLT 217 10/11/2014   GLUCOSE 133* 10/11/2014   CHOL 160 09/16/2013   TRIG 147.0 09/16/2013   HDL 45.20 09/16/2013   LDLDIRECT 133.0 09/17/2012   LDLCALC 85 09/16/2013   ALT 20 09/27/2014   AST 25 09/27/2014   NA 139 10/11/2014   K 4.0 10/11/2014   CL 105 10/11/2014   CREATININE  0.74 10/11/2014   BUN 7 10/11/2014   CO2 28 10/11/2014   TSH 2.53 09/16/2013   INR 1.0 10/04/2014   HGBA1C 7.3* 09/16/2013      Assessment / Plan:    Likely recurrence of Hodgkin's disease but so far unable to obtain a tissue diagnosis. I reviewed the scans with the patient and her husband and recommended that we proceed with repeat bronchoscopy examination with ebus scope and then proceeding with formal mediastinoscopy to obtain sufficient tissue to make a diagnosis of lymphoma. Risks and options of the procedure were discussed with the patient and her husband in detail. Plan to proceed on Wednesday, September 21.   The goals risks and alternatives of the planned surgical procedure Bronchoscopy, EBUS, mediastinoscopy have been discussed with the patient in detail. The risks of the procedure including death, infection, stroke, myocardial infarction, bleeding, blood transfusion have all been discussed specifically.  I have quoted [name] a 1 % of perioperative mortality and a complication rate as high as 10 %. The patient's questions have been answered.CLYDIE DILLEN is willing  to proceed with the planned procedure. Grace Isaac MD      [address] [place] [address] Office [phone]   Beeper [phone]  11/01/2014 7:28 AM

## 2014-11-01 NOTE — Anesthesia Procedure Notes (Signed)
Procedure Name: Intubation Date/Time: 11/01/2014 8:46 AM Performed by: Melina Copa, DAVID R Pre-anesthesia Checklist: Patient identified, Emergency Drugs available, Suction available, Patient being monitored and Timeout performed Patient Re-evaluated:Patient Re-evaluated prior to inductionOxygen Delivery Method: Circle system utilized Preoxygenation: Pre-oxygenation with 100% oxygen Intubation Type: IV induction Ventilation: Mask ventilation without difficulty Laryngoscope Size: Mac and 3 Grade View: Grade I Tube type: Oral Tube size: 9.0 mm Number of attempts: 1 Airway Equipment and Method: Stylet Placement Confirmation: ETT inserted through vocal cords under direct vision,  positive ETCO2 and breath sounds checked- equal and bilateral Secured at: 18 cm Tube secured with: Tape Dental Injury: Teeth and Oropharynx as per pre-operative assessment

## 2014-11-02 ENCOUNTER — Encounter (HOSPITAL_COMMUNITY): Payer: Self-pay | Admitting: Cardiothoracic Surgery

## 2014-11-02 LAB — TYPE AND SCREEN
ABO/RH(D): A POS
Antibody Screen: NEGATIVE
Donor AG Type: NEGATIVE
Donor AG Type: NEGATIVE
Unit division: 0
Unit division: 0

## 2014-11-03 NOTE — Op Note (Signed)
NAMEMARETA, CHESNUT NO.:  [id number]  MEDICAL RECORD NO.:  [id number]  LOCATION:  [facility]                         FACILITY:  [facility]  PHYSICIAN:  Lanelle Bal, MD    DATE OF BIRTH:  12-Dec-1940  DATE OF PROCEDURE:  11/01/2014 DATE OF DISCHARGE:  11/01/2014                              OPERATIVE REPORT   PREOPERATIVE DIAGNOSIS:  Previous history of lymphoma with recurrent enlarged mediastinal nodes.  POSTOPERATIVE DIAGNOSIS:  Previous history of lymphoma with recurrent enlarged mediastinal nodes.  SURGICAL PROCEDURE:  Bronchoscopy with endobronchial ultrasound and mediastinoscopy.  SURGEON:  Lanelle Bal, MD  BRIEF HISTORY:  The patient presented previously with Hodgkin's lymphoma primarily in the right lung which was treated.  On further surveillance scan, she had evidence of enlarged mediastinal nodes.  The patient was referred for repeat biopsy.  Risks and options were discussed with the patient in detail and she was agreeable, willing to proceed.  DESCRIPTION OF PROCEDURE:  The patient underwent general endotracheal anesthesia without incident.  A single-lumen endotracheal tube was placed.  Through this tube, a video bronchoscope was passed to the subsegmental level, both the right and left tracheobronchial tree without evidence of endobronchial lesions.  Scope was then removed and EBUS scope was used for examination of the nodes.  Previously, the Pulmonary Department had attempted an EBUS and was unsuccessful.  Using EBUS, we did look at the nodes, but felt with diagnosis of lymphoma in question and the position and size of nodes that we would best be served obtaining adequate tissue and highest likelihood of diagnosis with proceeding with mediastinoscopy.  The scopes were removed.  Neck was prepped with Betadine and draped in sterile manner.  Transverse incision was made in the suprasternal notch and dissection was carried down to the pretracheal  fascia.  Bluntly, the pretracheal fascia was separated. A video mediastinoscope was then introduced and as indicated on the CT scan and PET scan, some large hypermetabolic right paratracheal node easily identified.  This node was dissected free and large segment was submitted to Pathology for examination.  Initial touch prep confirmed adequate tissue for diagnosis.  With the operative field hemostatic, the scope was then removed.  The deep muscle layer was closed with interrupted 0 Vicryl and the skin edge closed with running 3-0 subcuticular stitch.  Dermabond was placed.  The patient was extubated in the operating room and transferred to the recovery room for further postoperative care.  Blood loss was minimal.     Lanelle Bal, MD    EG/MEDQ  D:  11/03/2014  T:  11/03/2014  Job:  [id number]

## 2014-11-06 ENCOUNTER — Ambulatory Visit (INDEPENDENT_AMBULATORY_CARE_PROVIDER_SITE_OTHER): Payer: Self-pay | Admitting: Cardiothoracic Surgery

## 2014-11-06 ENCOUNTER — Encounter: Payer: Self-pay | Admitting: Cardiothoracic Surgery

## 2014-11-06 ENCOUNTER — Telehealth: Payer: Self-pay | Admitting: Hematology and Oncology

## 2014-11-06 ENCOUNTER — Telehealth: Payer: Self-pay | Admitting: Family Medicine

## 2014-11-06 VITALS — BP 130/71 | HR 83 | Resp 20 | Ht 66.0 in | Wt 231.0 lb

## 2014-11-06 DIAGNOSIS — E038 Other specified hypothyroidism: Secondary | ICD-10-CM

## 2014-11-06 DIAGNOSIS — Z862 Personal history of diseases of the blood and blood-forming organs and certain disorders involving the immune mechanism: Secondary | ICD-10-CM

## 2014-11-06 DIAGNOSIS — R739 Hyperglycemia, unspecified: Secondary | ICD-10-CM

## 2014-11-06 DIAGNOSIS — E7800 Pure hypercholesterolemia, unspecified: Secondary | ICD-10-CM

## 2014-11-06 DIAGNOSIS — C8190 Hodgkin lymphoma, unspecified, unspecified site: Secondary | ICD-10-CM

## 2014-11-06 NOTE — Telephone Encounter (Signed)
We can do that or wait until after her oncology treatment - it is up to her (she has a lot going on right now)

## 2014-11-06 NOTE — Progress Notes (Signed)
[address]       [place] [address]             [phone]      Earla T Abrahamsen [place] Medical Record #[id number] Date of Birth: [birth year]  Referring: [name], MD Primary Care: [name], MD  Chief Complaint:   POST OP FOLLOW UP  History of Present Illness:     Patient returns to the office stating follow-up after her mediastinoscopy done last week. She's done well since discharge without complaints.     Past Medical History  Diagnosis Date  . Colon polyps   . Depression   . Hypothyroidism   . Osteoarthritis     hands  . Peripheral vascular disease   . Degenerative disk disease     spine in center in past   . 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 Cancer     skin cancer- basal cell on arm / colon 2011  . Lymphoma   . Colon cancer     08-2009  . 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 Cancer     colon CA and lung CA  . Heart murmur   . GERD (gastroesophageal reflux disease)   . Anemia     hx blood tx  . Pneumonia      History  Smoking status  . Never Smoker   Smokeless tobacco  . Never Used    Comment: Second-hand exposure through father.    History  Alcohol Use No     Allergies  Allergen Reactions  . Achromycin [Tetracycline Hcl]     Pt does not remember reaction  . Allopurinol     REACTION: Unsure of reaction happene years ago  . Astelin [Azelastine Hcl]     Unknown  . Cephalexin     REACTION: unsure of reaction happened yrs ago.  . Codeine Other (See Comments)    REACTION: abd. pain  . Meloxicam Other (See Comments)    REACTION: GI symptoms  . Minocycline Other (See Comments)    Abdominal pain  . Nabumetone     REACTION: reaction not known  . Nyquil [Pseudoeph-Doxylamine-Dm-Apap] Hives  . Penicillins     REACTION: reaction not known  . Sulfa Antibiotics     Gi side eff   . Zolpidem Tartrate     REACTION: feels too drugged  . Buspar [Buspirone Hcl]     Dizziness, and not as effective  for anxiety  . Ciprofloxacin Rash    Current Outpatient Prescriptions  Medication Sig Dispense Refill  . acetaminophen (TYLENOL) 500 MG tablet Take 1,000 mg by mouth every 6 (six) hours as needed for mild pain or moderate pain.    . Calcium Carbonate-Vitamin D (CALCIUM 600+D) 600-400 MG-UNIT per tablet Take 1 tablet by mouth daily.     . celecoxib (CELEBREX) 200 MG capsule Take 1 capsule (200 mg total) by mouth daily as needed for moderate pain (with food). (Patient taking differently: Take 200 mg by mouth daily. ) 90 capsule 3  . cholecalciferol (VITAMIN D) 1000 UNITS tablet Take 1,000 Units by mouth daily.     Marland Kitchen estradiol (ESTRACE) 2 MG tablet Take 0.5 tablets (1 mg total) by mouth daily. Takes 1/2 tablet 45 tablet 3  . fexofenadine (ALLEGRA) 180 MG tablet TAKE 1 TABLET (180 MG TOTAL) BY MOUTH DAILY. 90 tablet 0  . FLUoxetine (PROZAC) 40 MG capsule Take 40 mg by mouth daily.    . fluticasone (  FLONASE) 50 MCG/ACT nasal spray Place 1 spray into both  nostrils 2 (two) times  daily. 48 g 0  . gabapentin (NEURONTIN) 300 MG capsule Take 1 capsule (300 mg total) by mouth 3 (three) times daily. (Patient taking differently: Take 300 mg by mouth 2 (two) times daily. ) 90 capsule 3  . Hydrocodone-Chlorpheniramine 5-4 MG/5ML SOLN Take 5 mLs by mouth 2 (two) times daily as needed. For cough 120 mL 0  . levothyroxine (SYNTHROID, LEVOTHROID) 112 MCG tablet Take 1 tablet by mouth  daily (Patient taking differently: Take 112 mcg by mouth daily before breakfast. Take 1 tablet by mouth  daily) 90 tablet 3  . Omega-3 350 MG CAPS Take 1 capsule by mouth daily.    Marland Kitchen omeprazole (PRILOSEC) 40 MG capsule Take 1 capsule by mouth two times daily (Patient taking differently: Take 1 capsule by mouth daily) 180 capsule 0  . psyllium (METAMUCIL) 58.6 % packet Take 1-3 packets by mouth daily.     . simvastatin (ZOCOR) 40 MG tablet Take 1 tablet by mouth  daily (Patient taking differently: Take 40 mg by mouth daily at 6 PM.  Take 1 tablet by mouth  daily) 90 tablet 3  . vitamin E (VITAMIN E) 400 UNIT capsule Take 400 Units by mouth daily.      No current facility-administered medications for this visit.       Physical Exam: BP 130/71 mmHg  Pulse 83  Resp 20  Ht '5\' 6"'$  (1.676 m)  Wt 231 lb (104.781 kg)  BMI 37.30 kg/m2  SpO2 96%  LMP 02/10/1970  General appearance: alert and cooperative Neurologic: intact Heart: regular rate and rhythm, S1, S2 normal, no murmur, click, rub or gallop Lungs: clear to auscultation bilaterally Abdomen: soft, non-tender; bowel sounds normal; no masses,  no organomegaly Extremities: extremities normal, atraumatic, no cyanosis or edema and Homans sign is negative, no sign of DVT Wound: Patient neck incision is healing well without evidence of infection, the upper part of incision is slightly swollen but without erythema Patient does have slight fullness in the preop area bilaterally, feels slightly larger on the left than the right.  Diagnostic Studies & Laboratory data:     Recent Radiology Findings:   No results found.    Recent Lab Findings: Lab Results  Component Value Date   WBC 8.1 11/01/2014   HGB 13.2 11/01/2014   HCT 39.8 11/01/2014   PLT 226 11/01/2014   GLUCOSE 96 11/01/2014   CHOL 160 09/16/2013   TRIG 147.0 09/16/2013   HDL 45.20 09/16/2013   LDLDIRECT 133.0 09/17/2012   LDLCALC 85 09/16/2013   ALT 22 11/01/2014   AST 29 11/01/2014   NA 140 11/01/2014   K 3.8 11/01/2014   CL 104 11/01/2014   CREATININE 0.77 11/01/2014   BUN 11 11/01/2014   CO2 28 11/01/2014   TSH 2.53 09/16/2013   INR 1.05 11/01/2014   HGBA1C 7.3* 09/16/2013      Assessment / Plan:     Patient is status post mediastinoscopy to rule out recurrent Hodgkin's, I talked to the pathologist and initial impression is that there is no evidence of Hodgkin's and the specimen but the path has not been finalized as of yet. I have discussed this with the patient and hopefully the  final pathology will be available when she sees oncology early next week.  I've not made her return appointment to see me but would be glad to see her or  Dr. Lottie Rater requested  anytime.    Grace Isaac MD      [address] [place], [address] Office [phone]   Beeper [phone]  11/06/2014 3:19 PM

## 2014-11-06 NOTE — Telephone Encounter (Signed)
Patient called.  Patient has an appointment for a physical on 11/13/14. Patient said she's going to the surgeon that did her biopsy today and they have scheduled her an appointment with an oncologist on 11/13/14. Patient said she was due for a physical in August. Patient wants to know if she can come in sooner than April for her appointment.

## 2014-11-06 NOTE — Telephone Encounter (Signed)
s.w. pt husband and advised on OCT appt...ok and aware °

## 2014-11-06 NOTE — Telephone Encounter (Signed)
-----   Message from Ellamae Sia sent at 10/30/2014 11:17 AM EDT ----- Regarding: Lab orders for Tuesday, 9.27.16 Patient is scheduled for CPX labs, please order future labs, Thanks , Karna Christmas

## 2014-11-07 ENCOUNTER — Other Ambulatory Visit: Payer: Medicare Other

## 2014-11-07 ENCOUNTER — Telehealth: Payer: Self-pay | Admitting: Hematology and Oncology

## 2014-11-07 NOTE — Telephone Encounter (Signed)
s.w. husband and r/s appt....pt ok adn aware of new d.t.Marland KitchenMarland Kitchen

## 2014-11-13 ENCOUNTER — Encounter: Payer: Medicare Other | Admitting: Family Medicine

## 2014-11-13 ENCOUNTER — Ambulatory Visit: Payer: Medicare Other | Admitting: Hematology and Oncology

## 2014-11-14 ENCOUNTER — Ambulatory Visit (HOSPITAL_BASED_OUTPATIENT_CLINIC_OR_DEPARTMENT_OTHER): Payer: Medicare Other | Admitting: Hematology and Oncology

## 2014-11-14 ENCOUNTER — Encounter: Payer: Self-pay | Admitting: Hematology and Oncology

## 2014-11-14 VITALS — BP 126/82 | HR 79 | Temp 97.8°F | Resp 20 | Ht 66.0 in | Wt 240.7 lb

## 2014-11-14 DIAGNOSIS — R220 Localized swelling, mass and lump, head: Secondary | ICD-10-CM | POA: Diagnosis not present

## 2014-11-14 DIAGNOSIS — C8110 Nodular sclerosis classical Hodgkin lymphoma, unspecified site: Secondary | ICD-10-CM | POA: Diagnosis not present

## 2014-11-14 DIAGNOSIS — Z299 Encounter for prophylactic measures, unspecified: Secondary | ICD-10-CM | POA: Insufficient documentation

## 2014-11-14 DIAGNOSIS — Z23 Encounter for immunization: Secondary | ICD-10-CM | POA: Diagnosis present

## 2014-11-14 DIAGNOSIS — K118 Other diseases of salivary glands: Secondary | ICD-10-CM

## 2014-11-14 MED ORDER — INFLUENZA VAC SPLIT QUAD 0.5 ML IM SUSY
0.5000 mL | PREFILLED_SYRINGE | Freq: Once | INTRAMUSCULAR | Status: AC
  Administered 2014-11-14: 0.5 mL via INTRAMUSCULAR
  Filled 2014-11-14: qty 0.5

## 2014-11-14 NOTE — Assessment & Plan Note (Signed)
We discussed the importance of preventive care and reviewed the vaccination programs. She does not have any prior allergic reactions to influenza vaccination. She agrees to proceed with influenza vaccination today and we will administer it today at the clinic.  

## 2014-11-14 NOTE — Assessment & Plan Note (Signed)
I review her case at that hematology tumor board this morning. The patient had 3 different cancer diagnoses in the past. She had 2 separate procedures and biopsies which were non-diagnostic, which is a good thing, however we need to exclude malignancies. She has palpable bilateral parotid masses now. I have discussed this extensively with her ENT surgeon and he will reassess the patient for possible parotid mass dissection for tissue diagnosis.

## 2014-11-14 NOTE — Progress Notes (Signed)
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 Lymph nodes include a small right level 2 lymph node, right hilar lymph node, and right paratracheal lymph node   07/07/2014 Pathology Results  limited tissue from ultrasound-guided biopsy was nondiagnostic   07/07/2014 Procedure  ultrasound-guided biopsy was nondiagnostic   09/27/2014 Imaging Repeat PET CT scan show diffuse disease concern for relapse.   10/17/2014 Pathology Results Accession: [id number] Biopsy was nondiagnostic   10/17/2014 Procedure She underwent bronchoscopy & EBUS & biopsy of  LN at Level 10L   11/01/2014 Surgery She underwent cronchoscopy with endobronchial ultrasound, mediastinoscopy and biopsy    11/01/2014 Pathology Results Accession: [id number] biopsy showed no evidence of lymphoma, only granulomas.    Colon cancer ([place])   06/17/2013 Initial Diagnosis Colon cancer   09/05/2014 Procedure repeat colonoscopy was negative   09/05/2014 Pathology Results Biopsy was negative    INTERVAL HISTORY: Please see below for problem oriented charting. She returns for further follow-up. The mediastinoscopy sites had healed. She noticed recent palpable bilateral nodules over both sides of the parotid gland. It is not causing any pain.  REVIEW OF SYSTEMS:   Constitutional: Denies fevers, chills or abnormal weight loss Eyes: Denies blurriness of vision Ears, nose, mouth, throat, and face: Denies mucositis or sore throat Respiratory: Denies cough, dyspnea or  wheezes Cardiovascular: Denies palpitation, chest discomfort or lower extremity swelling Gastrointestinal:  Denies nausea, heartburn or change in bowel habits Skin: Denies abnormal skin rashes Lymphatics: Denies new lymphadenopathy or easy bruising Neurological:Denies numbness, tingling or new weaknesses Behavioral/Psych: Mood is stable, no new changes  All other systems were reviewed with the patient and are negative.  I have reviewed the past medical history, past surgical history, social history and family history with the patient and they are unchanged from previous note.  ALLERGIES:  is allergic to achromycin; allopurinol; astelin; cephalexin; codeine; meloxicam; minocycline; nabumetone; nyquil; penicillins; sulfa antibiotics; zolpidem tartrate; buspar; and ciprofloxacin.  MEDICATIONS:  Current Outpatient Prescriptions  Medication Sig Dispense Refill  . acetaminophen (TYLENOL) 500 MG tablet Take 1,000 mg by mouth every 6 (six) hours as needed for mild pain or moderate pain.    . Calcium Carbonate-Vitamin D (CALCIUM 600+D) 600-400 MG-UNIT per tablet Take 1 tablet by mouth daily.     . celecoxib (CELEBREX) 200 MG capsule Take 1 capsule (200 mg total) by mouth daily as needed for moderate pain (with food). (Patient taking differently: Take 200 mg by mouth daily. ) 90 capsule 3  . cholecalciferol (VITAMIN D) 1000 UNITS tablet Take 1,000 Units by mouth daily.     Marland Kitchen estradiol (ESTRACE) 2 MG tablet Take 0.5 tablets (1 mg total) by mouth daily. Takes 1/2 tablet 45 tablet 3  . fexofenadine (ALLEGRA) 180 MG tablet TAKE 1 TABLET (180 MG TOTAL) BY MOUTH DAILY. 90 tablet 0  . FLUoxetine (PROZAC) 40 MG capsule Take 40 mg by mouth daily.    . fluticasone (FLONASE) 50 MCG/ACT nasal spray Place 1 spray into both  nostrils 2 (two) times  daily. 48 g 0  . gabapentin (NEURONTIN) 300 MG capsule Take 1  capsule (300 mg total) by mouth 3 (three) times daily. (Patient taking differently: Take 300 mg by mouth 2  (two) times daily. ) 90 capsule 3  . Hydrocodone-Chlorpheniramine 5-4 MG/5ML SOLN Take 5 mLs by mouth 2 (two) times daily as needed. For cough 120 mL 0  . levothyroxine (SYNTHROID, LEVOTHROID) 112 MCG tablet Take 1 tablet by mouth  daily (Patient taking differently: Take 112 mcg by mouth daily before breakfast. Take 1 tablet by mouth  daily) 90 tablet 3  . Omega-3 350 MG CAPS Take 1 capsule by mouth daily.    Marland Kitchen omeprazole (PRILOSEC) 40 MG capsule Take 1 capsule by mouth two times daily (Patient taking differently: Take 1 capsule by mouth daily) 180 capsule 0  . psyllium (METAMUCIL) 58.6 % packet Take 1-3 packets by mouth daily.     . simvastatin (ZOCOR) 40 MG tablet Take 1 tablet by mouth  daily (Patient taking differently: Take 40 mg by mouth daily at 6 PM. Take 1 tablet by mouth  daily) 90 tablet 3  . vitamin E (VITAMIN E) 400 UNIT capsule Take 400 Units by mouth daily.      No current facility-administered medications for this visit.    PHYSICAL EXAMINATION: ECOG PERFORMANCE STATUS: 0 - Asymptomatic  Filed Vitals:   11/14/14 1331  BP: 126/82  Pulse: 79  Temp: 97.8 F (36.6 C)  Resp: 20   Filed Weights   11/14/14 1331  Weight: 240 lb 11.2 oz (109.181 kg)    GENERAL:alert, no distress and comfortable SKIN: skin color, texture, turgor are normal, no rashes or significant lesions EYES: normal, Conjunctiva are pink and non-injected, sclera clear OROPHARYNX:no exudate, no erythema and lips, buccal mucosa, and tongue normal  NECK: she have bilateral palpable nodules over the parotids LYMPH:  no palpable lymphadenopathy in the cervical, axillary or inguinal Musculoskeletal:no cyanosis of digits and no clubbing. Prior surgical sites has healed NEURO: alert & oriented x 3 with fluent speech, no focal motor/sensory deficits  LABORATORY DATA:  I have reviewed the data as listed    Component Value Date/Time   NA 140 11/01/2014 0708   NA 142 09/27/2014 1045   K 3.8 11/01/2014 0708    K 4.2 09/27/2014 1045   CL 104 11/01/2014 0708   CL 105 07/14/2012 0939   CO2 28 11/01/2014 0708   CO2 29 09/27/2014 1045   GLUCOSE 96 11/01/2014 0708   GLUCOSE 92 09/27/2014 1045   GLUCOSE 103* 07/14/2012 0939   BUN 11 11/01/2014 0708   BUN 13.0 09/27/2014 1045   CREATININE 0.77 11/01/2014 0708   CREATININE 0.8 09/27/2014 1045   CALCIUM 9.4 11/01/2014 0708   CALCIUM 9.4 09/27/2014 1045   CALCIUM 9.3 08/09/2009 0000   PROT 6.4* 11/01/2014 0708   PROT 6.3* 09/27/2014 1045   ALBUMIN 4.0 11/01/2014 0708   ALBUMIN 4.0 09/27/2014 1045   AST 29 11/01/2014 0708   AST 25 09/27/2014 1045   ALT 22 11/01/2014 0708   ALT 20 09/27/2014 1045   ALKPHOS 123 11/01/2014 0708   ALKPHOS 156* 09/27/2014 1045   BILITOT 0.5 11/01/2014 0708   BILITOT 0.57 09/27/2014 1045   GFRNONAA >60 11/01/2014 0708   GFRAA >60 11/01/2014 0708    No results found for: SPEP, UPEP  Lab Results  Component Value Date   WBC 8.1 11/01/2014   NEUTROABS 2.3 09/27/2014   HGB 13.2 11/01/2014   HCT 39.8 11/01/2014   MCV 90.0 11/01/2014   PLT 226 11/01/2014  Chemistry      Component Value Date/Time   NA 140 11/01/2014 0708   NA 142 09/27/2014 1045   K 3.8 11/01/2014 0708   K 4.2 09/27/2014 1045   CL 104 11/01/2014 0708   CL 105 07/14/2012 0939   CO2 28 11/01/2014 0708   CO2 29 09/27/2014 1045   BUN 11 11/01/2014 0708   BUN 13.0 09/27/2014 1045   CREATININE 0.77 11/01/2014 0708   CREATININE 0.8 09/27/2014 1045      Component Value Date/Time   CALCIUM 9.4 11/01/2014 0708   CALCIUM 9.4 09/27/2014 1045   CALCIUM 9.3 08/09/2009 0000   ALKPHOS 123 11/01/2014 0708   ALKPHOS 156* 09/27/2014 1045   AST 29 11/01/2014 0708   AST 25 09/27/2014 1045   ALT 22 11/01/2014 0708   ALT 20 09/27/2014 1045   BILITOT 0.5 11/01/2014 0708   BILITOT 0.57 09/27/2014 1045       RADIOGRAPHIC STUDIES:I reviewed the PET CT scan with the patient I have personally reviewed the radiological images as listed and agreed  with the findings in the report.   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 The patient had 3 different cancer diagnoses in the past. She had 2 separate procedures and biopsies which were non-diagnostic, which is a good thing, however we need to exclude malignancies. She has palpable bilateral parotid masses now. I have discussed this extensively with her ENT surgeon and he will reassess the patient for possible parotid mass dissection for tissue diagnosis.  Preventive measure We discussed the importance of preventive care and reviewed the vaccination programs. She does not have any prior allergic reactions to influenza vaccination. She agrees to proceed with influenza vaccination today and we will administer it today at the clinic.   Parotid mass She has now bilateral palpable masses on the parotid region. It is not causing any pain. I would recommend ENT consultation as above and she agreed to proceed.   No orders of the defined types were placed in this encounter.   All questions were answered. The patient knows to call the clinic with any problems, questions or concerns. No barriers to learning was detected. I spent 20 minutes counseling the patient face to face. The total time spent in the appointment was 25 minutes and more than 50% was on counseling and review of test results     [facility], [place], MD 11/14/2014 2:28 PM

## 2014-11-14 NOTE — Assessment & Plan Note (Signed)
She has now bilateral palpable masses on the parotid region. It is not causing any pain. I would recommend ENT consultation as above and she agreed to proceed.

## 2014-11-20 DIAGNOSIS — Z8572 Personal history of non-Hodgkin lymphomas: Secondary | ICD-10-CM | POA: Diagnosis not present

## 2014-11-20 DIAGNOSIS — R220 Localized swelling, mass and lump, head: Secondary | ICD-10-CM | POA: Diagnosis not present

## 2014-11-29 DIAGNOSIS — Z961 Presence of intraocular lens: Secondary | ICD-10-CM | POA: Diagnosis not present

## 2014-12-04 ENCOUNTER — Other Ambulatory Visit: Payer: Self-pay | Admitting: Otolaryngology

## 2014-12-04 DIAGNOSIS — D110 Benign neoplasm of parotid gland: Secondary | ICD-10-CM | POA: Diagnosis not present

## 2014-12-04 DIAGNOSIS — C8599 Non-Hodgkin lymphoma, unspecified, extranodal and solid organ sites: Secondary | ICD-10-CM | POA: Diagnosis not present

## 2014-12-04 DIAGNOSIS — C8519 Unspecified B-cell lymphoma, extranodal and solid organ sites: Secondary | ICD-10-CM | POA: Diagnosis not present

## 2014-12-06 ENCOUNTER — Ambulatory Visit (HOSPITAL_BASED_OUTPATIENT_CLINIC_OR_DEPARTMENT_OTHER): Payer: Medicare Other

## 2014-12-06 DIAGNOSIS — C8110 Nodular sclerosis classical Hodgkin lymphoma, unspecified site: Secondary | ICD-10-CM | POA: Diagnosis not present

## 2014-12-06 DIAGNOSIS — Z95828 Presence of other vascular implants and grafts: Secondary | ICD-10-CM

## 2014-12-06 DIAGNOSIS — Z452 Encounter for adjustment and management of vascular access device: Secondary | ICD-10-CM

## 2014-12-06 MED ORDER — SODIUM CHLORIDE 0.9 % IJ SOLN
10.0000 mL | INTRAMUSCULAR | Status: DC | PRN
  Administered 2014-12-06: 10 mL via INTRAVENOUS
  Filled 2014-12-06: qty 10

## 2014-12-06 MED ORDER — HEPARIN SOD (PORK) LOCK FLUSH 100 UNIT/ML IV SOLN
500.0000 [IU] | Freq: Once | INTRAVENOUS | Status: AC
  Administered 2014-12-06: 500 [IU] via INTRAVENOUS
  Filled 2014-12-06: qty 5

## 2014-12-06 NOTE — Patient Instructions (Signed)

## 2014-12-07 ENCOUNTER — Telehealth: Payer: Self-pay | Admitting: *Deleted

## 2014-12-07 NOTE — Telephone Encounter (Signed)
Informed pt of Dr. Calton Dach message and she agreed to come on Monday morning at 8:30 am.

## 2014-12-07 NOTE — Telephone Encounter (Signed)
I scheduled it

## 2014-12-07 NOTE — Telephone Encounter (Signed)
-----   Message from [name], MD sent at 12/07/2014 12:06 PM EDT ----- Regarding: LN biopsy Pathologist should sign out path soon Can the patient come in 830 am Monday to discuss Rx option? If so, let me know

## 2014-12-11 ENCOUNTER — Telehealth: Payer: Self-pay | Admitting: *Deleted

## 2014-12-11 ENCOUNTER — Ambulatory Visit (HOSPITAL_BASED_OUTPATIENT_CLINIC_OR_DEPARTMENT_OTHER): Payer: Medicare Other | Admitting: Hematology and Oncology

## 2014-12-11 ENCOUNTER — Encounter: Payer: Self-pay | Admitting: Hematology and Oncology

## 2014-12-11 VITALS — BP 132/61 | HR 86 | Temp 98.2°F | Resp 20 | Ht 66.0 in | Wt 236.1 lb

## 2014-12-11 DIAGNOSIS — C8298 Follicular lymphoma, unspecified, lymph nodes of multiple sites: Secondary | ICD-10-CM | POA: Insufficient documentation

## 2014-12-11 DIAGNOSIS — C8228 Follicular lymphoma grade III, unspecified, lymph nodes of multiple sites: Secondary | ICD-10-CM | POA: Diagnosis not present

## 2014-12-11 HISTORY — DX: Follicular lymphoma grade III, unspecified, lymph nodes of multiple sites: C82.28

## 2014-12-11 NOTE — Telephone Encounter (Signed)
-----   Message from [name], MD sent at 12/11/2014  9:29 AM EDT ----- Regarding: referral to [place] specialist Recurrent lymphoma

## 2014-12-11 NOTE — Telephone Encounter (Signed)
Dr Leonie Man- 11/15 @ 10:30  LM for patient with appt date and time. Asked to call and confirm receipt of message

## 2014-12-11 NOTE — Assessment & Plan Note (Addendum)
I have reviewed the biopsy with the pathologist. 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 The patient have evidence of imminent organ damage. The progressive enlarging lymph node over the left parotid region is now compromising nerve function. She is getting droopiness on the left eyelid which has suspect is due to facial nerve compromise. I will order urgent PET scan this week and referral to [facility] for second opinion and also to address the question whether she will be a candidate for stem cell transplant in the new future should she respond to treatment. I reviewed the current national guidelines with the patient and her husband. She has significant other comorbidities but tolerated all her surgeries well. She has received multiple doses of Adriamycin in the past with cumulative doses of 300 mg/m2 I would prefer to avoid that if possible. R-CEOP would be a reasonable choice for her. I will tentatively schedule appointment for her to see me back next week and plan to start cycle 1 of chemotherapy on 12/25/2014

## 2014-12-11 NOTE — Progress Notes (Signed)
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 Lymph nodes include a small right level 2 lymph node, right hilar lymph node, and right paratracheal lymph node   07/07/2014 Pathology Results  limited tissue from ultrasound-guided biopsy was nondiagnostic   07/07/2014 Procedure  ultrasound-guided biopsy was nondiagnostic   09/27/2014 Imaging Repeat PET CT scan show diffuse disease concern for relapse.   10/17/2014 Pathology Results Accession: [id number] Biopsy was nondiagnostic   10/17/2014 Procedure She underwent bronchoscopy & EBUS & biopsy of  LN at Level 10L   11/01/2014 Surgery She underwent cronchoscopy with endobronchial ultrasound, mediastinoscopy and biopsy    11/01/2014 Pathology Results Accession: [id number] biopsy showed no evidence of lymphoma, only granulomas.   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 She returns for further follow-up. She tolerated surgery well. She complained of droopiness of the left eyelid and enlarging lymph node over the left epitrochlear region/left parotid region  REVIEW OF  SYSTEMS:   Constitutional: Denies fevers, chills or abnormal weight loss Eyes: Denies blurriness of vision Ears, nose, mouth, throat, and face: Denies mucositis or sore throat Respiratory: Denies cough, dyspnea or wheezes Cardiovascular: Denies palpitation, chest discomfort or lower extremity swelling Gastrointestinal:  Denies nausea, heartburn or change in bowel habits Skin: Denies abnormal skin rashes Neurological:Denies numbness, tingling or new weaknesses Behavioral/Psych: Mood is stable, no new changes  All other systems were reviewed with the patient and are negative.  I have reviewed the past medical history, past surgical history, social history and family history with the patient and they are unchanged from previous note.  ALLERGIES:  is allergic to achromycin; allopurinol; astelin; cephalexin; codeine; meloxicam; minocycline; nabumetone; nyquil; penicillins; sulfa antibiotics; zolpidem tartrate; buspar; and ciprofloxacin.  MEDICATIONS:  Current Outpatient Prescriptions  Medication Sig Dispense Refill  . acetaminophen (TYLENOL) 500 MG tablet Take 1,000 mg by mouth every 6 (six) hours as needed for mild pain or moderate pain.    . Calcium Carbonate-Vitamin D (CALCIUM 600+D) 600-400 MG-UNIT per tablet Take 1 tablet by mouth daily.     . celecoxib (CELEBREX) 200 MG capsule Take 1 capsule (200 mg total) by mouth daily as needed for moderate pain (with food). (Patient taking differently: Take 200 mg by mouth daily. ) 90 capsule 3  . cholecalciferol (VITAMIN D) 1000 UNITS tablet Take 1,000 Units by mouth daily.     Marland Kitchen estradiol (ESTRACE) 2 MG tablet Take 0.5 tablets (1 mg total) by mouth daily. Takes 1/2 tablet 45 tablet 3  . fexofenadine (ALLEGRA) 180 MG tablet TAKE 1 TABLET (180 MG TOTAL) BY MOUTH DAILY. 90 tablet 0  . FLUoxetine (PROZAC) 40 MG capsule Take 40 mg by mouth daily.    . fluticasone (FLONASE) 50 MCG/ACT nasal spray  Place 1 spray into both  nostrils 2 (two) times  daily. 48  g 0  . gabapentin (NEURONTIN) 300 MG capsule Take 1 capsule (300 mg total) by mouth 3 (three) times daily. (Patient taking differently: Take 300 mg by mouth 2 (two) times daily. ) 90 capsule 3  . levothyroxine (SYNTHROID, LEVOTHROID) 112 MCG tablet Take 1 tablet by mouth  daily (Patient taking differently: Take 112 mcg by mouth daily before breakfast. Take 1 tablet by mouth  daily) 90 tablet 3  . Omega-3 350 MG CAPS Take 1 capsule by mouth daily.    Marland Kitchen omeprazole (PRILOSEC) 40 MG capsule Take 1 capsule by mouth two times daily (Patient taking differently: Take 1 capsule by mouth daily) 180 capsule 0  . psyllium (METAMUCIL) 58.6 % packet Take 1-3 packets by mouth daily.     . simvastatin (ZOCOR) 40 MG tablet Take 1 tablet by mouth  daily (Patient taking differently: Take 40 mg by mouth daily at 6 PM. Take 1 tablet by mouth  daily) 90 tablet 3  . vitamin E (VITAMIN E) 400 UNIT capsule Take 400 Units by mouth daily.     Marland Kitchen HYDROcodone-acetaminophen (NORCO/VICODIN) 5-325 MG tablet TAKE 1 TO 2 TABLETS BY MOUTH EVERY 4 TO 6 HOURS AS NEEDED  0  . Hydrocodone-Chlorpheniramine 5-4 MG/5ML SOLN Take 5 mLs by mouth 2 (two) times daily as needed. For cough (Patient not taking: Reported on 12/11/2014) 120 mL 0   No current facility-administered medications for this visit.    PHYSICAL EXAMINATION: ECOG PERFORMANCE STATUS: 1 - Symptomatic but completely ambulatory  Filed Vitals:   12/11/14 0852  BP: 132/61  Pulse: 86  Temp: 98.2 F (36.8 C)  Resp: 20   Filed Weights   12/11/14 0852  Weight: 236 lb 1.6 oz (107.094 kg)    GENERAL:alert, no distress and comfortable SKIN: skin color, texture, turgor are normal, no rashes or significant lesions EYES: normal, Conjunctiva are pink and non-injected, sclera clear. Noted droopiness on left eyelid OROPHARYNX:no exudate, no erythema and lips, buccal mucosa, and tongue normal  NECK: supple, thyroid normal size, non-tender, without nodularity. Well-healed surgical  scar  Musculoskeletal:no cyanosis of digits and no clubbing  NEURO: alert & oriented x 3 with fluent speech, no focal motor/sensory deficits  LABORATORY DATA:  I have reviewed the data as listed    Component Value Date/Time   NA 140 11/01/2014 0708   NA 142 09/27/2014 1045   K 3.8 11/01/2014 0708   K 4.2 09/27/2014 1045   CL 104 11/01/2014 0708   CL 105 07/14/2012 0939   CO2 28 11/01/2014 0708   CO2 29 09/27/2014 1045   GLUCOSE 96 11/01/2014 0708   GLUCOSE 92 09/27/2014 1045   GLUCOSE 103* 07/14/2012 0939   BUN 11 11/01/2014 0708   BUN 13.0 09/27/2014 1045   CREATININE 0.77 11/01/2014 0708   CREATININE 0.8 09/27/2014 1045   CALCIUM 9.4 11/01/2014 0708   CALCIUM 9.4 09/27/2014 1045   CALCIUM 9.3 08/09/2009 0000   PROT 6.4* 11/01/2014 0708   PROT 6.3* 09/27/2014 1045   ALBUMIN 4.0 11/01/2014 0708   ALBUMIN 4.0 09/27/2014 1045   AST 29 11/01/2014 0708   AST 25 09/27/2014 1045   ALT 22 11/01/2014 0708   ALT 20 09/27/2014 1045   ALKPHOS 123 11/01/2014 0708   ALKPHOS 156* 09/27/2014 1045   BILITOT 0.5 11/01/2014 0708   BILITOT 0.57 09/27/2014 1045   GFRNONAA >60 11/01/2014 0708   GFRAA >60 11/01/2014 9937  No results found for: SPEP, UPEP  Lab Results  Component Value Date   WBC 8.1 11/01/2014   NEUTROABS 2.3 09/27/2014   HGB 13.2 11/01/2014   HCT 39.8 11/01/2014   MCV 90.0 11/01/2014   PLT 226 11/01/2014      Chemistry      Component Value Date/Time   NA 140 11/01/2014 0708   NA 142 09/27/2014 1045   K 3.8 11/01/2014 0708   K 4.2 09/27/2014 1045   CL 104 11/01/2014 0708   CL 105 07/14/2012 0939   CO2 28 11/01/2014 0708   CO2 29 09/27/2014 1045   BUN 11 11/01/2014 0708   BUN 13.0 09/27/2014 1045   CREATININE 0.77 11/01/2014 0708   CREATININE 0.8 09/27/2014 1045      Component Value Date/Time   CALCIUM 9.4 11/01/2014 0708   CALCIUM 9.4 09/27/2014 1045   CALCIUM 9.3 08/09/2009 0000   ALKPHOS 123 11/01/2014 0708   ALKPHOS 156* 09/27/2014 1045   AST  29 11/01/2014 0708   AST 25 09/27/2014 1045   ALT 22 11/01/2014 0708   ALT 20 09/27/2014 1045   BILITOT 0.5 11/01/2014 0708   BILITOT 0.57 09/27/2014 1045     ASSESSMENT & PLAN:  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 The patient have evidence of imminent organ damage. The progressive enlarging lymph node over the left parotid region is now compromising nerve function. She is getting droopiness on the left eyelid which has suspect is due to facial nerve compromise. I will order urgent PET scan this week and referral to [facility] for second opinion and also to address the question whether she will be a candidate for stem cell transplant in the new future should she respond to treatment. I reviewed the current national guidelines with the patient and her husband. She has significant other comorbidities but tolerated all her surgeries well. She has received multiple doses of Adriamycin in the past with cumulative doses of 300 mg/m2 I would prefer to avoid that if possible. R-CEOP would be a reasonable choice for her. I will tentatively schedule appointment for her to see me back next week and plan to start cycle 1 of chemotherapy on 12/25/2014   Orders Placed This Encounter  Procedures  . NM PET Image Restag (PS) Skull Base To Thigh    Standing Status: Future     Number of Occurrences:      Standing Expiration Date: 02/10/2016    Order Specific Question:  Reason for Exam (SYMPTOM  OR DIAGNOSIS REQUIRED)    Answer:  staging lymphoma    Order Specific Question:  Preferred imaging location?    Answer:  [facility]   All questions were answered. The patient knows to call the clinic with any problems, questions or concerns. No barriers to learning  was detected. I spent 25 minutes counseling the patient face to face. The total time spent in the appointment was 40 minutes and more than 50% was on counseling and review of test results     [facility], [place], MD 12/11/2014 9:37 AM

## 2014-12-12 ENCOUNTER — Other Ambulatory Visit: Payer: Self-pay | Admitting: Hematology and Oncology

## 2014-12-13 ENCOUNTER — Telehealth: Payer: Self-pay | Admitting: *Deleted

## 2014-12-13 ENCOUNTER — Other Ambulatory Visit: Payer: Self-pay | Admitting: Hematology and Oncology

## 2014-12-13 ENCOUNTER — Encounter (HOSPITAL_COMMUNITY)
Admission: RE | Admit: 2014-12-13 | Discharge: 2014-12-13 | Disposition: A | Discharge status: Home / Self Care | Payer: Medicare Other | Source: Ambulatory Visit | Attending: Hematology and Oncology | Admitting: Hematology and Oncology

## 2014-12-13 ENCOUNTER — Other Ambulatory Visit: Payer: Self-pay | Admitting: *Deleted

## 2014-12-13 DIAGNOSIS — C8228 Follicular lymphoma grade III, unspecified, lymph nodes of multiple sites: Secondary | ICD-10-CM | POA: Insufficient documentation

## 2014-12-13 DIAGNOSIS — C8390 Non-follicular (diffuse) lymphoma, unspecified, unspecified site: Secondary | ICD-10-CM | POA: Diagnosis not present

## 2014-12-13 LAB — GLUCOSE, CAPILLARY: Glucose-Capillary: 89 mg/dL (ref 65–99)

## 2014-12-13 MED ORDER — FLUDEOXYGLUCOSE F - 18 (FDG) INJECTION
11.5000 | Freq: Once | INTRAVENOUS | Status: DC | PRN
  Administered 2014-12-13: 11.5 via INTRAVENOUS
  Filled 2014-12-13: qty 11.5

## 2014-12-13 NOTE — Telephone Encounter (Signed)
Per staff message and POF I have scheduled appts. Advised scheduler of appts. JMW  

## 2014-12-14 ENCOUNTER — Telehealth: Payer: Self-pay | Admitting: Hematology and Oncology

## 2014-12-14 NOTE — Telephone Encounter (Signed)
s.w. pt and advised on nov appt pt ok adn aware °

## 2014-12-15 ENCOUNTER — Other Ambulatory Visit: Payer: Self-pay | Admitting: *Deleted

## 2014-12-15 MED ORDER — CELECOXIB 200 MG PO CAPS: 200.0000 mg | ORAL_CAPSULE | Freq: Every day | ORAL | Status: DC | PRN

## 2014-12-21 ENCOUNTER — Telehealth: Payer: Self-pay

## 2014-12-21 NOTE — Telephone Encounter (Signed)
Returning pt call asking for phone number for [facility] so she can get her package of paperwork before her appt next week.

## 2014-12-22 ENCOUNTER — Other Ambulatory Visit: Payer: Self-pay | Admitting: Hematology and Oncology

## 2014-12-22 DIAGNOSIS — C8228 Follicular lymphoma grade III, unspecified, lymph nodes of multiple sites: Secondary | ICD-10-CM

## 2014-12-25 ENCOUNTER — Other Ambulatory Visit (HOSPITAL_BASED_OUTPATIENT_CLINIC_OR_DEPARTMENT_OTHER): Payer: Medicare Other

## 2014-12-25 ENCOUNTER — Telehealth: Payer: Self-pay | Admitting: Hematology and Oncology

## 2014-12-25 ENCOUNTER — Encounter: Payer: Self-pay | Admitting: Hematology and Oncology

## 2014-12-25 ENCOUNTER — Other Ambulatory Visit: Payer: Medicare Other

## 2014-12-25 ENCOUNTER — Ambulatory Visit (HOSPITAL_BASED_OUTPATIENT_CLINIC_OR_DEPARTMENT_OTHER): Payer: Medicare Other | Admitting: Hematology and Oncology

## 2014-12-25 ENCOUNTER — Ambulatory Visit (HOSPITAL_BASED_OUTPATIENT_CLINIC_OR_DEPARTMENT_OTHER): Payer: Medicare Other

## 2014-12-25 VITALS — BP 151/63 | HR 82 | Temp 98.1°F | Resp 18 | Ht 66.0 in | Wt 240.8 lb

## 2014-12-25 DIAGNOSIS — C8228 Follicular lymphoma grade III, unspecified, lymph nodes of multiple sites: Secondary | ICD-10-CM

## 2014-12-25 DIAGNOSIS — C8110 Nodular sclerosis classical Hodgkin lymphoma, unspecified site: Secondary | ICD-10-CM

## 2014-12-25 DIAGNOSIS — T451X5A Adverse effect of antineoplastic and immunosuppressive drugs, initial encounter: Secondary | ICD-10-CM

## 2014-12-25 DIAGNOSIS — C8298 Follicular lymphoma, unspecified, lymph nodes of multiple sites: Secondary | ICD-10-CM | POA: Diagnosis not present

## 2014-12-25 DIAGNOSIS — G620 Drug-induced polyneuropathy: Secondary | ICD-10-CM | POA: Diagnosis not present

## 2014-12-25 DIAGNOSIS — Z95828 Presence of other vascular implants and grafts: Secondary | ICD-10-CM

## 2014-12-25 LAB — COMPREHENSIVE METABOLIC PANEL (CC13)
ALT: 26 U/L (ref 0–55)
ANION GAP: 7 meq/L (ref 3–11)
AST: 29 U/L (ref 5–34)
Albumin: 3.7 g/dL (ref 3.5–5.0)
Alkaline Phosphatase: 126 U/L (ref 40–150)
BUN: 9.5 mg/dL (ref 7.0–26.0)
CALCIUM: 9.2 mg/dL (ref 8.4–10.4)
CHLORIDE: 107 meq/L (ref 98–109)
CO2: 27 mEq/L (ref 22–29)
CREATININE: 0.7 mg/dL (ref 0.6–1.1)
EGFR: 85 mL/min/{1.73_m2} — AB (ref >90)
Glucose: 102 mg/dl (ref 70–140)
Potassium: 3.9 mEq/L (ref 3.5–5.1)
Sodium: 140 mEq/L (ref 136–145)
Total Bilirubin: 0.45 mg/dL (ref 0.20–1.20)
Total Protein: 6 g/dL — ABNORMAL LOW (ref 6.4–8.3)

## 2014-12-25 LAB — CBC WITH DIFFERENTIAL/PLATELET
BASO%: 0.8 % (ref 0.0–2.0)
BASOS ABS: 0.1 10*3/uL (ref 0.0–0.1)
EOS ABS: 0.3 10*3/uL (ref 0.0–0.5)
EOS%: 4.1 % (ref 0.0–7.0)
HEMATOCRIT: 38.6 % (ref 34.8–46.6)
HGB: 12.7 g/dL (ref 11.6–15.9)
LYMPH#: 2.9 10*3/uL (ref 0.9–3.3)
LYMPH%: 40.6 % (ref 14.0–49.7)
MCH: 30.2 pg (ref 25.1–34.0)
MCHC: 32.9 g/dL (ref 31.5–36.0)
MCV: 91.9 fL (ref 79.5–101.0)
MONO#: 1.1 10*3/uL — AB (ref 0.1–0.9)
MONO%: 16.3 % — ABNORMAL HIGH (ref 0.0–14.0)
NEUT#: 2.7 10*3/uL (ref 1.5–6.5)
NEUT%: 38.2 % — AB (ref 38.4–76.8)
PLATELETS: 217 10*3/uL (ref 145–400)
RBC: 4.2 10*6/uL (ref 3.70–5.45)
RDW: 14.7 % — ABNORMAL HIGH (ref 11.2–14.5)
WBC: 7 10*3/uL (ref 3.9–10.3)

## 2014-12-25 LAB — LACTATE DEHYDROGENASE (CC13): LDH: 153 U/L (ref 125–245)

## 2014-12-25 LAB — URIC ACID (CC13): URIC ACID, SERUM: 5.9 mg/dL (ref 2.6–7.4)

## 2014-12-25 MED ORDER — HEPARIN SOD (PORK) LOCK FLUSH 100 UNIT/ML IV SOLN
500.0000 [IU] | Freq: Once | INTRAVENOUS | Status: AC
  Administered 2014-12-25: 500 [IU] via INTRAVENOUS
  Filled 2014-12-25: qty 5

## 2014-12-25 MED ORDER — SODIUM CHLORIDE 0.9 % IJ SOLN
10.0000 mL | INTRAMUSCULAR | Status: DC | PRN
  Administered 2014-12-25: 10 mL via INTRAVENOUS
  Filled 2014-12-25: qty 10

## 2014-12-25 MED ORDER — PREDNISONE 20 MG PO TABS: 60.0000 mg | ORAL_TABLET | Freq: Every day | ORAL | Status: DC

## 2014-12-25 MED ORDER — GABAPENTIN 300 MG PO CAPS: 300.0000 mg | ORAL_CAPSULE | Freq: Two times a day (BID) | ORAL | Status: DC

## 2014-12-25 MED ORDER — ONDANSETRON HCL 8 MG PO TABS: 8.0000 mg | ORAL_TABLET | Freq: Three times a day (TID) | ORAL | Status: DC | PRN

## 2014-12-25 MED ORDER — PROCHLORPERAZINE MALEATE 10 MG PO TABS: 10.0000 mg | ORAL_TABLET | Freq: Four times a day (QID) | ORAL | Status: DC | PRN

## 2014-12-25 NOTE — Assessment & Plan Note (Signed)
She has an appointment to see a physician at [facility] tomorrow for second opinion. I reviewed the pathology with the pathologist and high-grade lymphoma is seen, likely follicular grade IIIB. She had been exposed to Adriamcyin in the past. I discussed with the patient about treatment options. We discussed the risks, benefits, side effects of rituximab, Cytoxan, etoposide, vincristine and prednisone along with Neulasta support and she agreed to proceed. Due to risk of nausea and vomiting, I would give her Aloxi with dexamethasone as pre-med I will see her back prior to cycle 2 of therapy for toxicity review. With neuropathy, I plan to do 50% dose adjustment with Vincristine. I plan to restage her after 3 cycles of treatment for total 6 cycles.

## 2014-12-25 NOTE — Telephone Encounter (Signed)
Gave and pritned appt sched and avs for pt for NOV and DEC

## 2014-12-25 NOTE — Assessment & Plan Note (Signed)
I refill her prescription of gabapentin. I plan to reduce vincristine by 50% due to pre-existing peripheral neuropathy.

## 2014-12-25 NOTE — Progress Notes (Signed)
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 Lymph nodes include a small right level 2 lymph node, right hilar lymph node, and right paratracheal lymph node   07/07/2014 Pathology Results  limited tissue from ultrasound-guided biopsy was nondiagnostic   07/07/2014 Procedure  ultrasound-guided biopsy was nondiagnostic   09/27/2014 Imaging Repeat PET CT scan show diffuse disease concern for relapse.   10/17/2014 Pathology Results Accession: [id number] Biopsy was nondiagnostic   10/17/2014 Procedure She underwent bronchoscopy & EBUS & biopsy of  LN at Level 10L   11/01/2014 Surgery She underwent cronchoscopy with endobronchial ultrasound, mediastinoscopy and biopsy    11/01/2014 Pathology Results Accession: [id number] biopsy showed no evidence of lymphoma, only granulomas.   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 She returns for further follow-up. She continues to have droopiness on the left eye and enlarging lymph node over the left parotid area. She also has persistent peripheral neuropathy and occasional back pain.  REVIEW OF SYSTEMS:   Constitutional: Denies fevers, chills or abnormal weight loss Eyes: Denies blurriness of vision Ears, nose, mouth, throat, and face: Denies mucositis or sore throat Respiratory: Denies cough, dyspnea or wheezes Cardiovascular: Denies palpitation, chest discomfort or lower extremity swelling Gastrointestinal:  Denies nausea, heartburn or change in bowel habits Skin: Denies abnormal skin rashes Lymphatics: Denies new lymphadenopathy or easy bruising Neurological:Denies numbness, tingling or new weaknesses Behavioral/Psych: Mood is stable, no new changes  All other systems were reviewed with the patient and are negative.  I have reviewed the past medical history, past surgical history, social history and family history with the patient and they are unchanged from previous note.  ALLERGIES:  is allergic to achromycin; allopurinol; astelin; cephalexin; codeine; meloxicam; minocycline; nabumetone; nyquil; penicillins; sulfa antibiotics; zolpidem tartrate; buspar; and ciprofloxacin.  MEDICATIONS:  Current Outpatient Prescriptions  Medication Sig Dispense Refill  . acetaminophen (TYLENOL) 500 MG tablet Take 1,000 mg by mouth every 6 (six) hours as needed for mild pain or moderate pain.    . Calcium Carbonate-Vitamin D (CALCIUM 600+D) 600-400 MG-UNIT per tablet Take 1 tablet by mouth daily.     . celecoxib (CELEBREX) 200 MG capsule Take 1 capsule (200 mg total) by mouth daily as needed for moderate pain (with food). 90 capsule 1  . cholecalciferol (VITAMIN D) 1000 UNITS tablet Take 1,000 Units by mouth daily.     Marland Kitchen estradiol (ESTRACE) 2 MG tablet Take 0.5 tablets (1 mg total) by mouth daily. Takes 1/2 tablet 45 tablet 3  .  fexofenadine (ALLEGRA) 180  MG tablet TAKE 1 TABLET (180 MG TOTAL) BY MOUTH DAILY. 90 tablet 0  . FLUoxetine (PROZAC) 40 MG capsule Take 40 mg by mouth daily.    . fluticasone (FLONASE) 50 MCG/ACT nasal spray Place 1 spray into both  nostrils 2 (two) times  daily. 48 g 0  . gabapentin (NEURONTIN) 300 MG capsule Take 1 capsule (300 mg total) by mouth 2 (two) times daily. 90 capsule 3  . levothyroxine (SYNTHROID, LEVOTHROID) 112 MCG tablet Take 1 tablet by mouth  daily (Patient taking differently: Take 112 mcg by mouth daily before breakfast. Take 1 tablet by mouth  daily) 90 tablet 3  . Omega-3 350 MG CAPS Take 1 capsule by mouth daily.    Marland Kitchen omeprazole (PRILOSEC) 40 MG capsule Take 1 capsule by mouth two times daily (Patient taking differently: Take 1 capsule by mouth daily) 180 capsule 0  . psyllium (METAMUCIL) 58.6 % packet Take 1-3 packets by mouth daily.     . simvastatin (ZOCOR) 40 MG tablet Take 1 tablet by mouth  daily (Patient taking differently: Take 40 mg by mouth daily at 6 PM. Take 1 tablet by mouth  daily) 90 tablet 3  . vitamin E (VITAMIN E) 400 UNIT capsule Take 400 Units by mouth daily.     Marland Kitchen HYDROcodone-acetaminophen (NORCO/VICODIN) 5-325 MG tablet TAKE 1 TO 2 TABLETS BY MOUTH EVERY 4 TO 6 HOURS AS NEEDED  0  . Hydrocodone-Chlorpheniramine 5-4 MG/5ML SOLN Take 5 mLs by mouth 2 (two) times daily as needed. For cough (Patient not taking: Reported on 12/11/2014) 120 mL 0  . ondansetron (ZOFRAN) 8 MG tablet Take 1 tablet (8 mg total) by mouth every 8 (eight) hours as needed. 30 tablet 1  . predniSONE (DELTASONE) 20 MG tablet Take 3 tablets (60 mg total) by mouth daily. Take on days 4-5 of chemotherapy. 36 tablet 0  . prochlorperazine (COMPAZINE) 10 MG tablet Take 1 tablet (10 mg total) by mouth every 6 (six) hours as needed (Nausea or vomiting). 30 tablet 6   No current facility-administered medications for this visit.    PHYSICAL EXAMINATION: ECOG PERFORMANCE STATUS: 1 - Symptomatic but completely  ambulatory  Filed Vitals:   12/25/14 1058  BP: 151/63  Pulse: 82  Temp: 98.1 F (36.7 C)  Resp: 18   Filed Weights   12/25/14 1058  Weight: 240 lb 12.8 oz (109.226 kg)    GENERAL:alert, no distress and comfortable SKIN: skin color, texture, turgor are normal, no rashes or significant lesions EYES: normal, Conjunctiva are pink and non-injected, sclera clear OROPHARYNX:no exudate, no erythema and lips, buccal mucosa, and tongue normal  Musculoskeletal:no cyanosis of digits and no clubbing  NEURO: alert & oriented x 3 with fluent speech, no focal motor/sensory deficits. Noted occasional droopiness over the left eye  LABORATORY DATA:  I have reviewed the data as listed    Component Value Date/Time   NA 140 12/25/2014 1028   NA 140 11/01/2014 0708   K 3.9 12/25/2014 1028   K 3.8 11/01/2014 0708   CL 104 11/01/2014 0708   CL 105 07/14/2012 0939   CO2 27 12/25/2014 1028   CO2 28 11/01/2014 0708   GLUCOSE 102 12/25/2014 1028   GLUCOSE 96 11/01/2014 0708   GLUCOSE 103* 07/14/2012 0939   BUN 9.5 12/25/2014 1028   BUN 11 11/01/2014 0708   CREATININE 0.7 12/25/2014 1028   CREATININE 0.77 11/01/2014 0708   CALCIUM 9.2 12/25/2014 1028   CALCIUM 9.4  11/01/2014 0708   CALCIUM 9.3 08/09/2009 0000   PROT 6.0* 12/25/2014 1028   PROT 6.4* 11/01/2014 0708   ALBUMIN 3.7 12/25/2014 1028   ALBUMIN 4.0 11/01/2014 0708   AST 29 12/25/2014 1028   AST 29 11/01/2014 0708   ALT 26 12/25/2014 1028   ALT 22 11/01/2014 0708   ALKPHOS 126 12/25/2014 1028   ALKPHOS 123 11/01/2014 0708   BILITOT 0.45 12/25/2014 1028   BILITOT 0.5 11/01/2014 0708   GFRNONAA >60 11/01/2014 0708   GFRAA >60 11/01/2014 0708    No results found for: SPEP, UPEP  Lab Results  Component Value Date   WBC 7.0 12/25/2014   NEUTROABS 2.7 12/25/2014   HGB 12.7 12/25/2014   HCT 38.6 12/25/2014   MCV 91.9 12/25/2014   PLT 217 12/25/2014      Chemistry      Component Value Date/Time   NA 140 12/25/2014 1028    NA 140 11/01/2014 0708   K 3.9 12/25/2014 1028   K 3.8 11/01/2014 0708   CL 104 11/01/2014 0708   CL 105 07/14/2012 0939   CO2 27 12/25/2014 1028   CO2 28 11/01/2014 0708   BUN 9.5 12/25/2014 1028   BUN 11 11/01/2014 0708   CREATININE 0.7 12/25/2014 1028   CREATININE 0.77 11/01/2014 0708      Component Value Date/Time   CALCIUM 9.2 12/25/2014 1028   CALCIUM 9.4 11/01/2014 0708   CALCIUM 9.3 08/09/2009 0000   ALKPHOS 126 12/25/2014 1028   ALKPHOS 123 11/01/2014 0708   AST 29 12/25/2014 1028   AST 29 11/01/2014 0708   ALT 26 12/25/2014 1028   ALT 22 11/01/2014 0708   BILITOT 0.45 12/25/2014 1028   BILITOT 0.5 11/01/2014 0708       RADIOGRAPHIC STUDIES:I review PET CT scan with the patient I have personally reviewed the radiological images as listed and agreed with the findings in the report.    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 I reviewed the pathology with the pathologist and high-grade lymphoma is seen, likely follicular grade IIIB. She had been exposed to Adriamcyin in the past. I discussed with the patient about treatment options. We discussed the risks, benefits, side effects of rituximab, Cytoxan, etoposide, vincristine and prednisone along with Neulasta support and she agreed to proceed. Due to risk of nausea and vomiting, I would give her Aloxi with dexamethasone as pre-med I will see her back prior to cycle 2 of therapy for toxicity review. With neuropathy, I plan to do 50% dose adjustment with Vincristine. I plan to restage her after 3 cycles of treatment for total 6 cycles.  Peripheral neuropathy due to chemotherapy I refill her prescription of gabapentin. I plan to reduce vincristine by 50% due to pre-existing peripheral neuropathy.   Orders Placed This Encounter  Procedures  . CBC with Differential    Standing  Status: Standing     Number of Occurrences: 20     Standing Expiration Date: 12/26/2015  . Comprehensive metabolic panel    Standing Status: Standing     Number of Occurrences: 20     Standing Expiration Date: 12/26/2015  . Hepatitis B core antibody, IgM    Standing Status: Future     Number of Occurrences: 1     Standing Expiration Date: 01/29/2016  . Hepatitis B surface antibody  Standing Status: Future     Number of Occurrences: 1     Standing Expiration Date: 01/29/2016  . Hepatitis B surface antigen    Standing Status: Future     Number of Occurrences: 1     Standing Expiration Date: 01/29/2016   All questions were answered. The patient knows to call the clinic with any problems, questions or concerns. No barriers to learning was detected. I spent 30 minutes counseling the patient face to face. The total time spent in the appointment was 40 minutes and more than 50% was on counseling and review of test results     [facility], [place], MD 12/25/2014 12:52 PM

## 2014-12-25 NOTE — Patient Instructions (Signed)
Rituximab injection What is this medicine? RITUXIMAB (ri TUX i mab) is a monoclonal antibody. It is used commonly to treat non-Hodgkin lymphoma and other conditions. It is also used to treat rheumatoid arthritis (RA). In RA, this medicine slows the inflammatory process and help reduce joint pain and swelling. This medicine is often used with other cancer or arthritis medications. This medicine may be used for other purposes; ask your health care provider or pharmacist if you have questions. What should I tell my health care provider before I take this medicine? They need to know if you have any of these conditions: -blood disorders -heart disease -history of hepatitis B -infection (especially a virus infection such as chickenpox, cold sores, or herpes) -irregular heartbeat -kidney disease -lung or breathing disease, like asthma -lupus -an unusual or allergic reaction to rituximab, mouse proteins, other medicines, foods, dyes, or preservatives -pregnant or trying to get pregnant -breast-feeding How should I use this medicine? This medicine is for infusion into a vein. It is administered in a hospital or clinic by a specially trained health care professional. A special MedGuide will be given to you by the pharmacist with each prescription and refill. Be sure to read this information carefully each time. Talk to your pediatrician regarding the use of this medicine in children. This medicine is not approved for use in children. Overdosage: If you think you have taken too much of this medicine contact a poison control center or emergency room at once. NOTE: This medicine is only for you. Do not share this medicine with others. What if I miss a dose? It is important not to miss a dose. Call your doctor or health care professional if you are unable to keep an appointment. What may interact with this medicine? -cisplatin -medicines for blood pressure -some other medicines for  arthritis -vaccines This list may not describe all possible interactions. Give your health care provider a list of all the medicines, herbs, non-prescription drugs, or dietary supplements you use. Also tell them if you smoke, drink alcohol, or use illegal drugs. Some items may interact with your medicine. What should I watch for while using this medicine? Report any side effects that you notice during your treatment right away, such as changes in your breathing, fever, chills, dizziness or lightheadedness. These effects are more common with the first dose. Visit your prescriber or health care professional for checks on your progress. You will need to have regular blood work. Report any other side effects. The side effects of this medicine can continue after you finish your treatment. Continue your course of treatment even though you feel ill unless your doctor tells you to stop. Call your doctor or health care professional for advice if you get a fever, chills or sore throat, or other symptoms of a cold or flu. Do not treat yourself. This drug decreases your body's ability to fight infections. Try to avoid being around people who are sick. This medicine may increase your risk to bruise or bleed. Call your doctor or health care professional if you notice any unusual bleeding. Be careful brushing and flossing your teeth or using a toothpick because you may get an infection or bleed more easily. If you have any dental work done, tell your dentist you are receiving this medicine. Avoid taking products that contain aspirin, acetaminophen, ibuprofen, naproxen, or ketoprofen unless instructed by your doctor. These medicines may hide a fever. Do not become pregnant while taking this medicine. Women should inform their doctor if  they wish to become pregnant or think they might be pregnant. There is a potential for serious side effects to an unborn child. Talk to your health care professional or pharmacist for more  information. Do not breast-feed an infant while taking this medicine. What side effects may I notice from receiving this medicine? Side effects that you should report to your doctor or health care professional as soon as possible: -allergic reactions like skin rash, itching or hives, swelling of the face, lips, or tongue -low blood counts - this medicine may decrease the number of white blood cells, red blood cells and platelets. You may be at increased risk for infections and bleeding. -signs of infection - fever or chills, cough, sore throat, pain or difficulty passing urine -signs of decreased platelets or bleeding - bruising, pinpoint red spots on the skin, black, tarry stools, blood in the urine -signs of decreased red blood cells - unusually weak or tired, fainting spells, lightheadedness -breathing problems -confused, not responsive -chest pain -fast, irregular heartbeat -feeling faint or lightheaded, falls -mouth sores -redness, blistering, peeling or loosening of the skin, including inside the mouth -stomach pain -swelling of the ankles, feet, or hands -trouble passing urine or change in the amount of urine Side effects that usually do not require medical attention (report to your doctor or other health care professional if they continue or are bothersome): -anxiety -headache -loss of appetite -muscle aches -nausea -night sweats This list may not describe all possible side effects. Call your doctor for medical advice about side effects. You may report side effects to FDA at 1-800-FDA-1088. Where should I keep my medicine? This drug is given in a hospital or clinic and will not be stored at home. NOTE: This sheet is a summary. It may not cover all possible information. If you have questions about this medicine, talk to your doctor, pharmacist, or health care provider.    2016, [employer] Standard. (2014-04-05 22:30:56) Cyclophosphamide injection What is this  medicine? CYCLOPHOSPHAMIDE (sye kloe FOSS fa mide) is a chemotherapy drug. It slows the growth of cancer cells. This medicine is used to treat many types of cancer like lymphoma, myeloma, leukemia, breast cancer, and ovarian cancer, to name a few. This medicine may be used for other purposes; ask your health care provider or pharmacist if you have questions. What should I tell my health care provider before I take this medicine? They need to know if you have any of these conditions: -blood disorders -history of other chemotherapy -infection -kidney disease -liver disease -recent or ongoing radiation therapy -tumors in the bone marrow -an unusual or allergic reaction to cyclophosphamide, other chemotherapy, other medicines, foods, dyes, or preservatives -pregnant or trying to get pregnant -breast-feeding How should I use this medicine? This drug is usually given as an injection into a vein or muscle or by infusion into a vein. It is administered in a hospital or clinic by a specially trained health care professional. Talk to your pediatrician regarding the use of this medicine in children. Special care may be needed. Overdosage: If you think you have taken too much of this medicine contact a poison control center or emergency room at once. NOTE: This medicine is only for you. Do not share this medicine with others. What if I miss a dose? It is important not to miss your dose. Call your doctor or health care professional if you are unable to keep an appointment. What may interact with this medicine? This medicine may interact with the  following medications: -amiodarone -amphotericin B -azathioprine -certain antiviral medicines for HIV or AIDS such as protease inhibitors (e.g., indinavir, ritonavir) and zidovudine -certain blood pressure medications such as benazepril, captopril, enalapril, fosinopril, lisinopril, moexipril, monopril, perindopril, quinapril, ramipril, trandolapril -certain  cancer medications such as anthracyclines (e.g., daunorubicin, doxorubicin), busulfan, cytarabine, paclitaxel, pentostatin, tamoxifen, trastuzumab -certain diuretics such as chlorothiazide, chlorthalidone, hydrochlorothiazide, indapamide, metolazone -certain medicines that treat or prevent blood clots like warfarin -certain muscle relaxants such as succinylcholine -cyclosporine -etanercept -indomethacin -medicines to increase blood counts like filgrastim, pegfilgrastim, sargramostim -medicines used as general anesthesia -metronidazole -natalizumab This list may not describe all possible interactions. Give your health care provider a list of all the medicines, herbs, non-prescription drugs, or dietary supplements you use. Also tell them if you smoke, drink alcohol, or use illegal drugs. Some items may interact with your medicine. What should I watch for while using this medicine? Visit your doctor for checks on your progress. This drug may make you feel generally unwell. This is not uncommon, as chemotherapy can affect healthy cells as well as cancer cells. Report any side effects. Continue your course of treatment even though you feel ill unless your doctor tells you to stop. Drink water or other fluids as directed. Urinate often, even at night. In some cases, you may be given additional medicines to help with side effects. Follow all directions for their use. Call your doctor or health care professional for advice if you get a fever, chills or sore throat, or other symptoms of a cold or flu. Do not treat yourself. This drug decreases your body's ability to fight infections. Try to avoid being around people who are sick. This medicine may increase your risk to bruise or bleed. Call your doctor or health care professional if you notice any unusual bleeding. Be careful brushing and flossing your teeth or using a toothpick because you may get an infection or bleed more easily. If you have any dental  work done, tell your dentist you are receiving this medicine. You may get drowsy or dizzy. Do not drive, use machinery, or do anything that needs mental alertness until you know how this medicine affects you. Do not become pregnant while taking this medicine or for 1 year after stopping it. Women should inform their doctor if they wish to become pregnant or think they might be pregnant. Men should not father a child while taking this medicine and for 4 months after stopping it. There is a potential for serious side effects to an unborn child. Talk to your health care professional or pharmacist for more information. Do not breast-feed an infant while taking this medicine. This medicine may interfere with the ability to have a child. This medicine has caused ovarian failure in some women. This medicine has caused reduced sperm counts in some men. You should talk with your doctor or health care professional if you are concerned about your fertility. If you are going to have surgery, tell your doctor or health care professional that you have taken this medicine. What side effects may I notice from receiving this medicine? Side effects that you should report to your doctor or health care professional as soon as possible: -allergic reactions like skin rash, itching or hives, swelling of the face, lips, or tongue -low blood counts - this medicine may decrease the number of white blood cells, red blood cells and platelets. You may be at increased risk for infections and bleeding. -signs of infection - fever or chills, cough, sore  throat, pain or difficulty passing urine -signs of decreased platelets or bleeding - bruising, pinpoint red spots on the skin, black, tarry stools, blood in the urine -signs of decreased red blood cells - unusually weak or tired, fainting spells, lightheadedness -breathing problems -dark urine -dizziness -palpitations -swelling of the ankles, feet, hands -trouble passing urine or  change in the amount of urine -weight gain -yellowing of the eyes or skin Side effects that usually do not require medical attention (report to your doctor or health care professional if they continue or are bothersome): -changes in nail or skin color -hair loss -missed menstrual periods -mouth sores -nausea, vomiting This list may not describe all possible side effects. Call your doctor for medical advice about side effects. You may report side effects to FDA at 1-800-FDA-1088. Where should I keep my medicine? This drug is given in a hospital or clinic and will not be stored at home. NOTE: This sheet is a summary. It may not cover all possible information. If you have questions about this medicine, talk to your doctor, pharmacist, or health care provider.    2016, [employer] Standard. (2011-12-12 16:22:58) Etoposide, VP-16 injection What is this medicine? ETOPOSIDE, VP-16 (e toe POE side) is a chemotherapy drug. It is used to treat testicular cancer, lung cancer, and other cancers. This medicine may be used for other purposes; ask your health care provider or pharmacist if you have questions. What should I tell my health care provider before I take this medicine? They need to know if you have any of these conditions: -infection -kidney disease -low blood counts, like low white cell, platelet, or red cell counts -an unusual or allergic reaction to etoposide, other chemotherapeutic agents, other medicines, foods, dyes, or preservatives -pregnant or trying to get pregnant -breast-feeding How should I use this medicine? This medicine is for infusion into a vein. It is administered in a hospital or clinic by a specially trained health care professional. Talk to your pediatrician regarding the use of this medicine in children. Special care may be needed. Overdosage: If you think you have taken too much of this medicine contact a poison control center or emergency room at once. NOTE:  This medicine is only for you. Do not share this medicine with others. What if I miss a dose? It is important not to miss your dose. Call your doctor or health care professional if you are unable to keep an appointment. What may interact with this medicine? -aspirin -certain medications for seizures like carbamazepine, phenobarbital, phenytoin, valproic acid -cyclosporine -levamisole -warfarin This list may not describe all possible interactions. Give your health care provider a list of all the medicines, herbs, non-prescription drugs, or dietary supplements you use. Also tell them if you smoke, drink alcohol, or use illegal drugs. Some items may interact with your medicine. What should I watch for while using this medicine? Visit your doctor for checks on your progress. This drug may make you feel generally unwell. This is not uncommon, as chemotherapy can affect healthy cells as well as cancer cells. Report any side effects. Continue your course of treatment even though you feel ill unless your doctor tells you to stop. In some cases, you may be given additional medicines to help with side effects. Follow all directions for their use. Call your doctor or health care professional for advice if you get a fever, chills or sore throat, or other symptoms of a cold or flu. Do not treat yourself. This drug decreases your body's  ability to fight infections. Try to avoid being around people who are sick. This medicine may increase your risk to bruise or bleed. Call your doctor or health care professional if you notice any unusual bleeding. Be careful brushing and flossing your teeth or using a toothpick because you may get an infection or bleed more easily. If you have any dental work done, tell your dentist you are receiving this medicine. Avoid taking products that contain aspirin, acetaminophen, ibuprofen, naproxen, or ketoprofen unless instructed by your doctor. These medicines may hide a fever. Do not  become pregnant while taking this medicine or for at least 6 months after stopping it. Women should inform their doctor if they wish to become pregnant or think they might be pregnant. Women of child-bearing potential will need to have a negative pregnancy test before starting this medicine. There is a potential for serious side effects to an unborn child. Talk to your health care professional or pharmacist for more information. Do not breast-feed an infant while taking this medicine. Men must use a latex condom during sexual contact with a woman while taking this medicine and for at least 4 months after stopping it. A latex condom is needed even if you have had a vasectomy. Contact your doctor right away if your partner becomes pregnant. Do not donate sperm while taking this medicine and for at least 4 months after you stop taking this medicine. Men should inform their doctors if they wish to father a child. This medicine may lower sperm counts. What side effects may I notice from receiving this medicine? Side effects that you should report to your doctor or health care professional as soon as possible: -allergic reactions like skin rash, itching or hives, swelling of the face, lips, or tongue -low blood counts - this medicine may decrease the number of white blood cells, red blood cells and platelets. You may be at increased risk for infections and bleeding. -signs of infection - fever or chills, cough, sore throat, pain or difficulty passing urine -signs of decreased platelets or bleeding - bruising, pinpoint red spots on the skin, black, tarry stools, blood in the urine -signs of decreased red blood cells - unusually weak or tired, fainting spells, lightheadedness -breathing problems -changes in vision -mouth or throat sores or ulcers -pain, redness, swelling or irritation at the injection site -pain, tingling, numbness in the hands or feet -redness, blistering, peeling or loosening of the skin,  including inside the mouth -seizures -vomiting Side effects that usually do not require medical attention (report to your doctor or health care professional if they continue or are bothersome): -diarrhea -hair loss -loss of appetite -nausea -stomach pain This list may not describe all possible side effects. Call your doctor for medical advice about side effects. You may report side effects to FDA at 1-800-FDA-1088. Where should I keep my medicine? This drug is given in a hospital or clinic and will not be stored at home. NOTE: This sheet is a summary. It may not cover all possible information. If you have questions about this medicine, talk to your doctor, pharmacist, or health care provider.    2016, [employer] Standard. (2013-09-22 12:32:50) Vincristine injection What is this medicine? VINCRISTINE (vin KRIS teen) is a chemotherapy drug. It slows the growth of cancer cells. This medicine is used to treat many types of cancer like Hodgkin's disease, leukemia, non-Hodgkin's lymphoma, neuroblastoma (brain cancer), rhabdomyosarcoma, and Wilms' tumor. This medicine may be used for other purposes; ask your health care  provider or pharmacist if you have questions. What should I tell my health care provider before I take this medicine? They need to know if you have any of these conditions: -blood disorders -gout -infection (especially chickenpox, cold sores, or herpes) -kidney disease -liver disease -lung disease -nervous system disease like Charcot-Marie-Tooth (CMT) -recent or ongoing radiation therapy -an unusual or allergic reaction to vincristine, other chemotherapy agents, other medicines, foods, dyes, or preservatives -pregnant or trying to get pregnant -breast-feeding How should I use this medicine? This drug is given as an infusion into a vein. It is administered in a hospital or clinic by a specially trained health care professional. If you have pain, swelling, burning, or any  unusual feeling around the site of your injection, tell your health care professional right away. Talk to your pediatrician regarding the use of this medicine in children. While this drug may be prescribed for selected conditions, precautions do apply. Overdosage: If you think you have taken too much of this medicine contact a poison control center or emergency room at once. NOTE: This medicine is only for you. Do not share this medicine with others. What if I miss a dose? It is important not to miss your dose. Call your doctor or health care professional if you are unable to keep an appointment. What may interact with this medicine? Do not take this medicine with any of the following medications: -itraconazole -mibefradil -voriconazole This medicine may also interact with the following medications: -cyclosporine -erythromycin -fluconazole -ketoconazole -medicines for HIV like delavirdine, efavirenz, nevirapine -medicines for seizures like ethotoin, fosphenotoin, phenytoin -medicines to increase blood counts like filgrastim, pegfilgrastim, sargramostim -other chemotherapy drugs like cisplatin, L-asparaginase, methotrexate, mitomycin, paclitaxel -pegaspargase -vaccines -zalcitabine, ddC Talk to your doctor or health care professional before taking any of these medicines: -acetaminophen -aspirin -ibuprofen -ketoprofen -naproxen This list may not describe all possible interactions. Give your health care provider a list of all the medicines, herbs, non-prescription drugs, or dietary supplements you use. Also tell them if you smoke, drink alcohol, or use illegal drugs. Some items may interact with your medicine. What should I watch for while using this medicine? Your condition will be monitored carefully while you are receiving this medicine. You will need important blood work done while you are taking this medicine. This drug may make you feel generally unwell. This is not uncommon, as  chemotherapy can affect healthy cells as well as cancer cells. Report any side effects. Continue your course of treatment even though you feel ill unless your doctor tells you to stop. In some cases, you may be given additional medicines to help with side effects. Follow all directions for their use. Call your doctor or health care professional for advice if you get a fever, chills or sore throat, or other symptoms of a cold or flu. Do not treat yourself. Avoid taking products that contain aspirin, acetaminophen, ibuprofen, naproxen, or ketoprofen unless instructed by your doctor. These medicines may hide a fever. Do not become pregnant while taking this medicine. Women should inform their doctor if they wish to become pregnant or think they might be pregnant. There is a potential for serious side effects to an unborn child. Talk to your health care professional or pharmacist for more information. Do not breast-feed an infant while taking this medicine. Men may have a lower sperm count while taking this medicine. Talk to your doctor if you plan to father a child. What side effects may I notice from receiving this medicine? Side  effects that you should report to your doctor or health care professional as soon as possible: -allergic reactions like skin rash, itching or hives, swelling of the face, lips, or tongue -breathing problems -confusion or changes in emotions or moods -constipation -cough -mouth sores -muscle weakness -nausea and vomiting -pain, swelling, redness or irritation at the injection site -pain, tingling, numbness in the hands or feet -problems with balance, talking, walking -seizures -stomach pain -trouble passing urine or change in the amount of urine Side effects that usually do not require medical attention (report to your doctor or health care professional if they continue or are bothersome): -diarrhea -hair loss -jaw pain -loss of appetite This list may not describe  all possible side effects. Call your doctor for medical advice about side effects. You may report side effects to FDA at 1-800-FDA-1088. Where should I keep my medicine? This drug is given in a hospital or clinic and will not be stored at home. NOTE: This sheet is a summary. It may not cover all possible information. If you have questions about this medicine, talk to your doctor, pharmacist, or health care provider.    2016, [employer] Standard. (2007-10-25 17:17:13) Prednisone tablets What is this medicine? PREDNISONE (PRED Maricsa Sammons sone) is a corticosteroid. It is commonly used to treat inflammation of the skin, joints, lungs, and other organs. Common conditions treated include asthma, allergies, and arthritis. It is also used for other conditions, such as blood disorders and diseases of the adrenal glands. This medicine may be used for other purposes; ask your health care provider or pharmacist if you have questions. What should I tell my health care provider before I take this medicine? They need to know if you have any of these conditions: -Cushing's syndrome -diabetes -glaucoma -heart disease -high blood pressure -infection (especially a virus infection such as chickenpox, cold sores, or herpes) -kidney disease -liver disease -mental illness -myasthenia gravis -osteoporosis -seizures -stomach or intestine problems -thyroid disease -an unusual or allergic reaction to lactose, prednisone, other medicines, foods, dyes, or preservatives -pregnant or trying to get pregnant -breast-feeding How should I use this medicine? Take this medicine by mouth with a glass of water. Follow the directions on the prescription label. Take this medicine with food. If you are taking this medicine once a day, take it in the morning. Do not take more medicine than you are told to take. Do not suddenly stop taking your medicine because you may develop a severe reaction. Your doctor will tell you how much  medicine to take. If your doctor wants you to stop the medicine, the dose may be slowly lowered over time to avoid any side effects. Talk to your pediatrician regarding the use of this medicine in children. Special care may be needed. Overdosage: If you think you have taken too much of this medicine contact a poison control center or emergency room at once. NOTE: This medicine is only for you. Do not share this medicine with others. What if I miss a dose? If you miss a dose, take it as soon as you can. If it is almost time for your next dose, talk to your doctor or health care professional. You may need to miss a dose or take an extra dose. Do not take double or extra doses without advice. What may interact with this medicine? Do not take this medicine with any of the following medications: -metyrapone -mifepristone This medicine may also interact with the following medications: -aminoglutethimide -amphotericin B -aspirin and aspirin-like medicines -  barbiturates -certain medicines for diabetes, like glipizide or glyburide -cholestyramine -cholinesterase inhibitors -cyclosporine -digoxin -diuretics -ephedrine -female hormones, like estrogens and birth control pills -isoniazid -ketoconazole -NSAIDS, medicines for pain and inflammation, like ibuprofen or naproxen -phenytoin -rifampin -toxoids -vaccines -warfarin This list may not describe all possible interactions. Give your health care provider a list of all the medicines, herbs, non-prescription drugs, or dietary supplements you use. Also tell them if you smoke, drink alcohol, or use illegal drugs. Some items may interact with your medicine. What should I watch for while using this medicine? Visit your doctor or health care professional for regular checks on your progress. If you are taking this medicine over a prolonged period, carry an identification card with your name and address, the type and dose of your medicine, and your  doctor's name and address. This medicine may increase your risk of getting an infection. Tell your doctor or health care professional if you are around anyone with measles or chickenpox, or if you develop sores or blisters that do not heal properly. If you are going to have surgery, tell your doctor or health care professional that you have taken this medicine within the last twelve months. Ask your doctor or health care professional about your diet. You may need to lower the amount of salt you eat. This medicine may affect blood sugar levels. If you have diabetes, check with your doctor or health care professional before you change your diet or the dose of your diabetic medicine. What side effects may I notice from receiving this medicine? Side effects that you should report to your doctor or health care professional as soon as possible: -allergic reactions like skin rash, itching or hives, swelling of the face, lips, or tongue -changes in emotions or moods -changes in vision -depressed mood -eye pain -fever or chills, cough, sore throat, pain or difficulty passing urine -increased thirst -swelling of ankles, feet Side effects that usually do not require medical attention (report to your doctor or health care professional if they continue or are bothersome): -confusion, excitement, restlessness -headache -nausea, vomiting -skin problems, acne, thin and shiny skin -trouble sleeping -weight gain This list may not describe all possible side effects. Call your doctor for medical advice about side effects. You may report side effects to FDA at 1-800-FDA-1088. Where should I keep my medicine? Keep out of the reach of children. Store at room temperature between 15 and 30 degrees C (59 and 86 degrees F). Protect from light. Keep container tightly closed. Throw away any unused medicine after the expiration date. NOTE: This sheet is a summary. It may not cover all possible information. If you have  questions about this medicine, talk to your doctor, pharmacist, or health care provider.    2016, [employer]. (2010-09-12 10:57:14)

## 2014-12-26 DIAGNOSIS — Z888 Allergy status to other drugs, medicaments and biological substances status: Secondary | ICD-10-CM | POA: Diagnosis not present

## 2014-12-26 DIAGNOSIS — C8221 Follicular lymphoma grade III, unspecified, lymph nodes of head, face, and neck: Secondary | ICD-10-CM | POA: Diagnosis not present

## 2014-12-26 DIAGNOSIS — Z880 Allergy status to penicillin: Secondary | ICD-10-CM | POA: Diagnosis not present

## 2014-12-26 DIAGNOSIS — Z79899 Other long term (current) drug therapy: Secondary | ICD-10-CM | POA: Diagnosis not present

## 2014-12-26 DIAGNOSIS — E039 Hypothyroidism, unspecified: Secondary | ICD-10-CM | POA: Diagnosis not present

## 2014-12-26 DIAGNOSIS — C8190 Hodgkin lymphoma, unspecified, unspecified site: Secondary | ICD-10-CM | POA: Diagnosis not present

## 2014-12-26 DIAGNOSIS — C8240 Follicular lymphoma grade IIIb, unspecified site: Secondary | ICD-10-CM | POA: Diagnosis not present

## 2014-12-26 LAB — HEPATITIS B SURFACE ANTIGEN: Hepatitis B Surface Ag: NEGATIVE

## 2014-12-26 LAB — HEPATITIS B CORE ANTIBODY, IGM: Hep B C IgM: NONREACTIVE

## 2014-12-26 LAB — HEPATITIS B SURFACE ANTIBODY,QUALITATIVE: Hep B S Ab: NEGATIVE

## 2014-12-27 ENCOUNTER — Encounter: Payer: Self-pay | Admitting: Hematology and Oncology

## 2014-12-27 ENCOUNTER — Telehealth: Payer: Self-pay | Admitting: Hematology and Oncology

## 2014-12-27 ENCOUNTER — Ambulatory Visit (HOSPITAL_BASED_OUTPATIENT_CLINIC_OR_DEPARTMENT_OTHER): Payer: Medicare Other | Admitting: Hematology and Oncology

## 2014-12-27 ENCOUNTER — Encounter: Payer: Self-pay | Admitting: *Deleted

## 2014-12-27 ENCOUNTER — Ambulatory Visit: Payer: Medicare Other

## 2014-12-27 VITALS — BP 138/71 | HR 84 | Temp 98.2°F | Ht 66.0 in | Wt 241.9 lb

## 2014-12-27 DIAGNOSIS — G620 Drug-induced polyneuropathy: Secondary | ICD-10-CM | POA: Diagnosis not present

## 2014-12-27 DIAGNOSIS — T451X5A Adverse effect of antineoplastic and immunosuppressive drugs, initial encounter: Secondary | ICD-10-CM

## 2014-12-27 DIAGNOSIS — C8228 Follicular lymphoma grade III, unspecified, lymph nodes of multiple sites: Secondary | ICD-10-CM | POA: Diagnosis not present

## 2014-12-27 NOTE — Assessment & Plan Note (Signed)
I have a long discussion with the patient and her husband. I discussed the case late last night with the [place] from [facility]. She expressed concern whether she would be able to tolerate aggressive chemotherapy and autologous stem cell transplant in the future. She recommended switching her treatment to RICE regimen for 2 cycles before repeat PET/CT scan. I had a long discussion with the patient and husband regarding the risks, benefit, side effects of rituximab, ifosfamide, carboplatin and etoposide in comparison with our original plan to give her rituximab, Cytoxan, etoposide, vincristine and prednisone. Some of the expected risks, benefits, and side effects including risk of pancytopenia, hair loss, nausea, vomiting & risks of infection are discussed with the patient. Ultimately, she agreed to proceed with switching treatment to RICE tomorrow. Due to her baseline peripheral neuropathy, I will reduce the dose of carboplatin a little bit. I will also changed the premedication to something stronger in view of prior history of nausea and vomiting related to treatment. Due to high risk of pancytopenia, after chemotherapy, she will return on a weekly basis for blood draw and transfusion support as needed.

## 2014-12-27 NOTE — Telephone Encounter (Signed)
Gave and printed appt sched and avs fo rpt; for NOV  °

## 2014-12-27 NOTE — Progress Notes (Signed)
[place] OFFICE PROGRESS NOTE  Patient Care Team: Abner Greenspan, MD as PCP - General Mosetta Anis, MD (Allergy) Fanny Skates, MD as Consulting Physician (General Surgery) Grace Isaac, MD as Consulting Physician (Cardiothoracic Surgery)  SUMMARY OF ONCOLOGIC HISTORY:   History of hodgkin's lymphoma   05/20/2013 Initial Diagnosis Hodgkin lymphoma   06/27/2014 Imaging Interval increase in metabolic activity of several small lymph nodes is concerning for lymphoma recurrence. Lymph nodes include a small right level 2 lymph node, right hilar lymph node, and right paratracheal lymph node   07/07/2014 Pathology Results  limited tissue from ultrasound-guided biopsy was nondiagnostic   07/07/2014 Procedure  ultrasound-guided biopsy was nondiagnostic   09/27/2014 Imaging Repeat PET CT scan show diffuse disease concern for relapse.   10/17/2014 Pathology Results Accession: [id number] Biopsy was nondiagnostic   10/17/2014 Procedure She underwent bronchoscopy & EBUS & biopsy of  LN at Level 10L   11/01/2014 Surgery She underwent cronchoscopy with endobronchial ultrasound, mediastinoscopy and biopsy    11/01/2014 Pathology Results Accession: [id number] biopsy showed no evidence of lymphoma, only granulomas.   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 She is seen as an urgent add-on to review treatment recommendation from [facility]. She has no new complaints since I saw her several days ago.  REVIEW OF SYSTEMS:   Constitutional: Denies fevers, chills or abnormal weight loss Eyes: Denies blurriness of vision Ears, nose, mouth, throat, and face: Denies mucositis or sore throat Respiratory: Denies cough, dyspnea or wheezes Cardiovascular: Denies palpitation, chest discomfort or lower extremity swelling Gastrointestinal:  Denies nausea, heartburn or change in bowel habits Skin: Denies abnormal skin rashes Lymphatics: Denies new lymphadenopathy or easy bruising Neurological:Denies numbness, tingling or new weaknesses Behavioral/Psych: Mood is stable, no new changes  All other systems were reviewed with the patient and are negative.  I have reviewed the past medical history, past surgical history, social history and family history with the patient and they are unchanged from previous note.  ALLERGIES:  is allergic to achromycin; allopurinol; astelin; cephalexin; codeine; meloxicam; minocycline; nabumetone; nyquil; penicillins; sulfa antibiotics; zolpidem tartrate; buspar; and ciprofloxacin.  MEDICATIONS:  Current Outpatient Prescriptions  Medication Sig Dispense Refill  . acetaminophen (TYLENOL) 500 MG tablet Take 1,000 mg by mouth every 6 (six) hours as needed for mild pain or moderate pain.    . Calcium Carbonate-Vitamin D (CALCIUM 600+D) 600-400 MG-UNIT per tablet Take 1 tablet by mouth daily.     . celecoxib (CELEBREX) 200 MG capsule Take 1 capsule (200 mg total) by mouth daily as needed for moderate pain (with food). 90 capsule 1  . cholecalciferol (VITAMIN D) 1000 UNITS tablet Take 1,000 Units by mouth daily.     Marland Kitchen estradiol (ESTRACE) 2 MG tablet Take 0.5 tablets (1 mg total) by mouth daily. Takes 1/2 tablet 45 tablet 3  . fexofenadine (ALLEGRA) 180 MG tablet TAKE 1  TABLET (180 MG TOTAL) BY MOUTH DAILY. 90 tablet 0  .  FLUoxetine (PROZAC) 40 MG capsule Take 40 mg by mouth daily.    . fluticasone (FLONASE) 50 MCG/ACT nasal spray Place 1 spray into both  nostrils 2 (two) times  daily. 48 g 0  . gabapentin (NEURONTIN) 300 MG capsule Take 1 capsule (300 mg total) by mouth 2 (two) times daily. 90 capsule 3  . HYDROcodone-acetaminophen (NORCO/VICODIN) 5-325 MG tablet TAKE 1 TO 2 TABLETS BY MOUTH EVERY 4 TO 6 HOURS AS NEEDED  0  . Hydrocodone-Chlorpheniramine 5-4 MG/5ML SOLN Take 5 mLs by mouth 2 (two) times daily as needed. For cough (Patient not taking: Reported on 12/11/2014) 120 mL 0  . levothyroxine (SYNTHROID, LEVOTHROID) 112 MCG tablet Take 1 tablet by mouth  daily (Patient taking differently: Take 112 mcg by mouth daily before breakfast. Take 1 tablet by mouth  daily) 90 tablet 3  . Omega-3 350 MG CAPS Take 1 capsule by mouth daily.    Marland Kitchen omeprazole (PRILOSEC) 40 MG capsule Take 1 capsule by mouth two times daily (Patient taking differently: Take 1 capsule by mouth daily) 180 capsule 0  . psyllium (METAMUCIL) 58.6 % packet Take 1-3 packets by mouth daily.     . simvastatin (ZOCOR) 40 MG tablet Take 1 tablet by mouth  daily (Patient taking differently: Take 40 mg by mouth daily at 6 PM. Take 1 tablet by mouth  daily) 90 tablet 3  . vitamin E (VITAMIN E) 400 UNIT capsule Take 400 Units by mouth daily.      No current facility-administered medications for this visit.    PHYSICAL EXAMINATION: ECOG PERFORMANCE STATUS: 1 - Symptomatic but completely ambulatory  Filed Vitals:   12/27/14 0852  BP: 138/71  Pulse: 84  Temp: 98.2 F (36.8 C)   Filed Weights   12/27/14 0852  Weight: 241 lb 14.4 oz (109.725 kg)    GENERAL:alert, no distress and comfortable SKIN: skin color, texture, turgor are normal, no rashes or significant lesions EYES: normal, Conjunctiva are pink and non-injected, sclera clear Musculoskeletal:no cyanosis of digits and no clubbing  NEURO: alert & oriented x 3 with fluent speech, no focal  motor/sensory deficits  LABORATORY DATA:  I have reviewed the data as listed    Component Value Date/Time   NA 140 12/25/2014 1028   NA 140 11/01/2014 0708   K 3.9 12/25/2014 1028   K 3.8 11/01/2014 0708   CL 104 11/01/2014 0708   CL 105 07/14/2012 0939   CO2 27 12/25/2014 1028   CO2 28 11/01/2014 0708   GLUCOSE 102 12/25/2014 1028   GLUCOSE 96 11/01/2014 0708   GLUCOSE 103* 07/14/2012 0939   BUN 9.5 12/25/2014 1028   BUN 11 11/01/2014 0708   CREATININE 0.7 12/25/2014 1028   CREATININE 0.77 11/01/2014 0708   CALCIUM 9.2 12/25/2014 1028   CALCIUM 9.4 11/01/2014 0708   CALCIUM 9.3 08/09/2009 0000   PROT 6.0* 12/25/2014 1028   PROT 6.4* 11/01/2014 0708   ALBUMIN 3.7 12/25/2014 1028   ALBUMIN 4.0 11/01/2014 0708   AST 29 12/25/2014 1028   AST 29 11/01/2014 0708   ALT 26 12/25/2014 1028   ALT 22 11/01/2014 0708   ALKPHOS 126 12/25/2014 1028   ALKPHOS 123 11/01/2014 0708   BILITOT 0.45 12/25/2014 1028   BILITOT 0.5 11/01/2014 0708   GFRNONAA >60 11/01/2014 0708   GFRAA >60 11/01/2014 0708    No results found for: SPEP, UPEP  Lab Results  Component Value Date   WBC 7.0 12/25/2014   NEUTROABS 2.7 12/25/2014   HGB 12.7 12/25/2014   HCT 38.6 12/25/2014   MCV 91.9 12/25/2014   PLT 217 12/25/2014      Chemistry      Component Value Date/Time   NA 140 12/25/2014 1028   NA 140 11/01/2014 0708   K 3.9 12/25/2014 1028   K 3.8 11/01/2014 0708   CL 104 11/01/2014 0708   CL 105 07/14/2012 0939   CO2 27 12/25/2014 1028   CO2 28 11/01/2014 0708   BUN 9.5 12/25/2014 1028   BUN 11 11/01/2014 0708   CREATININE 0.7 12/25/2014 1028   CREATININE 0.77 11/01/2014 0708      Component Value Date/Time   CALCIUM 9.2 12/25/2014 1028   CALCIUM 9.4 11/01/2014 0708   CALCIUM 9.3 08/09/2009 0000   ALKPHOS 126 12/25/2014 1028   ALKPHOS 123 11/01/2014 0708   AST 29 12/25/2014 1028   AST 29 11/01/2014 0708   ALT 26 12/25/2014 1028   ALT 22 11/01/2014 0708   BILITOT 0.45  12/25/2014 1028   BILITOT 0.5 11/01/2014 0708    ASSESSMENT & PLAN:  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 I discussed the case late last night with the [place] from [facility]. She expressed concern whether she would be able to tolerate aggressive chemotherapy and autologous stem cell transplant in the future. She recommended switching her treatment to RICE regimen for 2 cycles before repeat PET/CT scan. I had a long discussion with the patient and husband regarding the risks, benefit, side effects of rituximab, ifosfamide, carboplatin and etoposide in comparison with our original plan to give her rituximab, Cytoxan, etoposide, vincristine and prednisone. Some of the expected risks, benefits, and side effects including risk of pancytopenia, hair loss, nausea, vomiting & risks of infection are discussed with the patient. Ultimately, she agreed to proceed with switching treatment to RICE tomorrow. Due to her baseline peripheral neuropathy, I will reduce the dose of carboplatin a little bit. I will also changed the premedication to something stronger in view of prior history of nausea and vomiting related to treatment. Due to high risk of pancytopenia, after chemotherapy, she will return on a weekly basis for blood draw and transfusion support as needed.  Peripheral neuropathy due to chemotherapy Due to neuropathy from prior chemotherapy, I will reduce the dose of carboplatin a little bit.   Orders Placed This Encounter  Procedures  . Hold Tube, Blood Bank    Standing Status: Standing     Number of Occurrences: 9     Standing Expiration Date: 12/27/2015   All questions were answered. The patient knows to call the clinic with any problems, questions or concerns. No barriers to learning was detected. I spent 25 minutes counseling the patient face to face. The total time spent in the  appointment was 40 minutes and more than 50% was on counseling and review of test results     [facility], [place], MD 12/27/2014 10:27 AM

## 2014-12-27 NOTE — Assessment & Plan Note (Signed)
Due to neuropathy from prior chemotherapy, I will reduce the dose of carboplatin a little bit.

## 2014-12-28 ENCOUNTER — Encounter (HOSPITAL_COMMUNITY): Payer: Self-pay

## 2014-12-28 ENCOUNTER — Inpatient Hospital Stay (HOSPITAL_COMMUNITY)
Admission: AD | Admit: 2014-12-28 | Discharge: 2014-12-30 | DRG: 847 | Disposition: A | Discharge status: Home / Self Care | Payer: Medicare Other | Source: Ambulatory Visit | Attending: Hematology and Oncology | Admitting: Hematology and Oncology

## 2014-12-28 ENCOUNTER — Ambulatory Visit: Payer: Medicare Other

## 2014-12-28 DIAGNOSIS — K219 Gastro-esophageal reflux disease without esophagitis: Secondary | ICD-10-CM | POA: Diagnosis present

## 2014-12-28 DIAGNOSIS — G620 Drug-induced polyneuropathy: Secondary | ICD-10-CM | POA: Diagnosis present

## 2014-12-28 DIAGNOSIS — Z85038 Personal history of other malignant neoplasm of large intestine: Secondary | ICD-10-CM

## 2014-12-28 DIAGNOSIS — Z9081 Acquired absence of spleen: Secondary | ICD-10-CM | POA: Diagnosis not present

## 2014-12-28 DIAGNOSIS — J810 Acute pulmonary edema: Secondary | ICD-10-CM

## 2014-12-28 DIAGNOSIS — E8770 Fluid overload, unspecified: Secondary | ICD-10-CM | POA: Diagnosis not present

## 2014-12-28 DIAGNOSIS — E038 Other specified hypothyroidism: Secondary | ICD-10-CM

## 2014-12-28 DIAGNOSIS — T451X5A Adverse effect of antineoplastic and immunosuppressive drugs, initial encounter: Secondary | ICD-10-CM | POA: Diagnosis present

## 2014-12-28 DIAGNOSIS — R030 Elevated blood-pressure reading, without diagnosis of hypertension: Secondary | ICD-10-CM | POA: Diagnosis not present

## 2014-12-28 DIAGNOSIS — J029 Acute pharyngitis, unspecified: Secondary | ICD-10-CM | POA: Diagnosis not present

## 2014-12-28 DIAGNOSIS — Z5111 Encounter for antineoplastic chemotherapy: Principal | ICD-10-CM

## 2014-12-28 DIAGNOSIS — Z86010 Personal history of colonic polyps: Secondary | ICD-10-CM

## 2014-12-28 DIAGNOSIS — I739 Peripheral vascular disease, unspecified: Secondary | ICD-10-CM | POA: Diagnosis present

## 2014-12-28 DIAGNOSIS — Z833 Family history of diabetes mellitus: Secondary | ICD-10-CM | POA: Diagnosis not present

## 2014-12-28 DIAGNOSIS — R591 Generalized enlarged lymph nodes: Secondary | ICD-10-CM

## 2014-12-28 DIAGNOSIS — E7800 Pure hypercholesterolemia, unspecified: Secondary | ICD-10-CM

## 2014-12-28 DIAGNOSIS — C8110 Nodular sclerosis classical Hodgkin lymphoma, unspecified site: Secondary | ICD-10-CM

## 2014-12-28 DIAGNOSIS — C8298 Follicular lymphoma, unspecified, lymph nodes of multiple sites: Secondary | ICD-10-CM | POA: Diagnosis present

## 2014-12-28 DIAGNOSIS — E039 Hypothyroidism, unspecified: Secondary | ICD-10-CM | POA: Diagnosis present

## 2014-12-28 DIAGNOSIS — R519 Headache, unspecified: Secondary | ICD-10-CM

## 2014-12-28 DIAGNOSIS — Z9049 Acquired absence of other specified parts of digestive tract: Secondary | ICD-10-CM

## 2014-12-28 DIAGNOSIS — M542 Cervicalgia: Secondary | ICD-10-CM | POA: Diagnosis not present

## 2014-12-28 DIAGNOSIS — C8228 Follicular lymphoma grade III, unspecified, lymph nodes of multiple sites: Secondary | ICD-10-CM

## 2014-12-28 DIAGNOSIS — Z808 Family history of malignant neoplasm of other organs or systems: Secondary | ICD-10-CM

## 2014-12-28 DIAGNOSIS — R51 Headache: Secondary | ICD-10-CM | POA: Diagnosis not present

## 2014-12-28 DIAGNOSIS — Z8249 Family history of ischemic heart disease and other diseases of the circulatory system: Secondary | ICD-10-CM | POA: Diagnosis not present

## 2014-12-28 DIAGNOSIS — Z85118 Personal history of other malignant neoplasm of bronchus and lung: Secondary | ICD-10-CM | POA: Diagnosis not present

## 2014-12-28 DIAGNOSIS — H02402 Unspecified ptosis of left eyelid: Secondary | ICD-10-CM | POA: Diagnosis not present

## 2014-12-28 DIAGNOSIS — R0602 Shortness of breath: Secondary | ICD-10-CM | POA: Diagnosis not present

## 2014-12-28 DIAGNOSIS — C8248 Follicular lymphoma grade IIIb, lymph nodes of multiple sites: Secondary | ICD-10-CM | POA: Diagnosis present

## 2014-12-28 MED ORDER — DIPHENHYDRAMINE HCL 50 MG/ML IJ SOLN
25.0000 mg | Freq: Once | INTRAMUSCULAR | Status: AC
  Administered 2014-12-28: 25 mg via INTRAVENOUS

## 2014-12-28 MED ORDER — ONDANSETRON 4 MG PO TBDP: 4.0000 mg | ORAL_TABLET | Freq: Three times a day (TID) | ORAL | Status: DC | PRN

## 2014-12-28 MED ORDER — SODIUM CHLORIDE 0.9 % IV SOLN
8.0000 mg | Freq: Three times a day (TID) | INTRAVENOUS | Status: DC | PRN
  Filled 2014-12-28: qty 4

## 2014-12-28 MED ORDER — DIPHENHYDRAMINE HCL 50 MG PO CAPS
50.0000 mg | ORAL_CAPSULE | Freq: Once | ORAL | Status: AC
  Administered 2014-12-28: 50 mg via ORAL
  Filled 2014-12-28: qty 1

## 2014-12-28 MED ORDER — SODIUM CHLORIDE 0.9 % IJ SOLN: 10.0000 mL | INTRAMUSCULAR | Status: DC | PRN

## 2014-12-28 MED ORDER — SODIUM CHLORIDE 0.9 % IV SOLN
INTRAVENOUS | Status: DC
  Administered 2014-12-28 – 2014-12-30 (×3): via INTRAVENOUS

## 2014-12-28 MED ORDER — ONDANSETRON HCL 4 MG PO TABS: 4.0000 mg | ORAL_TABLET | Freq: Three times a day (TID) | ORAL | Status: DC | PRN

## 2014-12-28 MED ORDER — VITAMIN D 1000 UNITS PO TABS
1000.0000 [IU] | ORAL_TABLET | Freq: Every day | ORAL | Status: DC
  Administered 2014-12-29 – 2014-12-30 (×2): 1000 [IU] via ORAL
  Filled 2014-12-28 (×3): qty 1

## 2014-12-28 MED ORDER — PALONOSETRON HCL INJECTION 0.25 MG/5ML
0.2500 mg | Freq: Once | INTRAVENOUS | Status: AC
  Administered 2014-12-28: 0.25 mg via INTRAVENOUS
  Filled 2014-12-28: qty 5

## 2014-12-28 MED ORDER — ALUM & MAG HYDROXIDE-SIMETH 200-200-20 MG/5ML PO SUSP: 60.0000 mL | ORAL | Status: DC | PRN

## 2014-12-28 MED ORDER — HEPARIN SOD (PORK) LOCK FLUSH 100 UNIT/ML IV SOLN: 250.0000 [IU] | Freq: Once | INTRAVENOUS | Status: DC | PRN

## 2014-12-28 MED ORDER — SODIUM CHLORIDE 0.9 % IJ SOLN: 3.0000 mL | INTRAMUSCULAR | Status: DC | PRN

## 2014-12-28 MED ORDER — SODIUM CHLORIDE 0.9 % IV SOLN
375.0000 mg/m2 | Freq: Once | INTRAVENOUS | Status: AC
  Administered 2014-12-28: 800 mg via INTRAVENOUS
  Filled 2014-12-28: qty 50

## 2014-12-28 MED ORDER — OXYCODONE HCL 5 MG PO TABS
5.0000 mg | ORAL_TABLET | ORAL | Status: DC | PRN
  Administered 2014-12-29 – 2014-12-30 (×3): 5 mg via ORAL
  Filled 2014-12-28 (×3): qty 1

## 2014-12-28 MED ORDER — ALTEPLASE 2 MG IJ SOLR
2.0000 mg | Freq: Once | INTRAMUSCULAR | Status: DC | PRN
  Filled 2014-12-28: qty 2

## 2014-12-28 MED ORDER — LEVOTHYROXINE SODIUM 112 MCG PO TABS
112.0000 ug | ORAL_TABLET | Freq: Every day | ORAL | Status: DC
  Administered 2014-12-29 – 2014-12-30 (×2): 112 ug via ORAL
  Filled 2014-12-28 (×4): qty 1

## 2014-12-28 MED ORDER — ESTRADIOL 1 MG PO TABS
1.0000 mg | ORAL_TABLET | Freq: Every day | ORAL | Status: DC
  Administered 2014-12-29 – 2014-12-30 (×2): 1 mg via ORAL
  Filled 2014-12-28 (×3): qty 1

## 2014-12-28 MED ORDER — ACETAMINOPHEN 500 MG PO TABS
1000.0000 mg | ORAL_TABLET | Freq: Four times a day (QID) | ORAL | Status: DC | PRN
  Administered 2014-12-29 – 2014-12-30 (×4): 1000 mg via ORAL
  Filled 2014-12-28 (×4): qty 2

## 2014-12-28 MED ORDER — LIDOCAINE-PRILOCAINE 2.5-2.5 % EX CREA: TOPICAL_CREAM | Freq: Once | CUTANEOUS | Status: DC

## 2014-12-28 MED ORDER — ENOXAPARIN SODIUM 40 MG/0.4ML ~~LOC~~ SOLN
40.0000 mg | SUBCUTANEOUS | Status: DC
  Administered 2014-12-28 – 2014-12-30 (×3): 40 mg via SUBCUTANEOUS
  Filled 2014-12-28 (×3): qty 0.4

## 2014-12-28 MED ORDER — DEXAMETHASONE SODIUM PHOSPHATE 100 MG/10ML IJ SOLN
10.0000 mg | Freq: Once | INTRAMUSCULAR | Status: AC
  Administered 2014-12-28: 10 mg via INTRAVENOUS
  Filled 2014-12-28: qty 1

## 2014-12-28 MED ORDER — GABAPENTIN 300 MG PO CAPS
300.0000 mg | ORAL_CAPSULE | Freq: Two times a day (BID) | ORAL | Status: DC
  Administered 2014-12-28 – 2014-12-30 (×5): 300 mg via ORAL
  Filled 2014-12-28 (×5): qty 1

## 2014-12-28 MED ORDER — SODIUM CHLORIDE 0.9 % IV SOLN
100.0000 mg/m2 | Freq: Once | INTRAVENOUS | Status: AC
  Administered 2014-12-28: 230 mg via INTRAVENOUS
  Filled 2014-12-28 (×2): qty 11.5

## 2014-12-28 MED ORDER — ONDANSETRON HCL 4 MG/2ML IJ SOLN: 4.0000 mg | Freq: Three times a day (TID) | INTRAMUSCULAR | Status: DC | PRN

## 2014-12-28 MED ORDER — FLUOXETINE HCL 20 MG PO CAPS
40.0000 mg | ORAL_CAPSULE | Freq: Every day | ORAL | Status: DC
  Administered 2014-12-29 – 2014-12-30 (×2): 40 mg via ORAL
  Filled 2014-12-28 (×3): qty 2

## 2014-12-28 MED ORDER — HOT PACK MISC ONCOLOGY: 1.0000 | Freq: Once | Status: AC | PRN

## 2014-12-28 MED ORDER — SODIUM CHLORIDE 0.9 % IV SOLN: INTRAVENOUS | Status: DC

## 2014-12-28 MED ORDER — HEPARIN SOD (PORK) LOCK FLUSH 100 UNIT/ML IV SOLN
500.0000 [IU] | Freq: Once | INTRAVENOUS | Status: DC | PRN
Start: 2014-12-28 — End: 2014-12-30

## 2014-12-28 MED ORDER — FAMOTIDINE IN NACL 20-0.9 MG/50ML-% IV SOLN
20.0000 mg | Freq: Once | INTRAVENOUS | Status: AC
  Administered 2014-12-28: 20 mg via INTRAVENOUS
  Filled 2014-12-28: qty 50

## 2014-12-28 MED ORDER — SIMVASTATIN 40 MG PO TABS
40.0000 mg | ORAL_TABLET | Freq: Every day | ORAL | Status: DC
  Administered 2014-12-28 – 2014-12-29 (×2): 40 mg via ORAL
  Filled 2014-12-28 (×2): qty 1

## 2014-12-28 MED ORDER — SENNOSIDES-DOCUSATE SODIUM 8.6-50 MG PO TABS: 1.0000 | ORAL_TABLET | Freq: Every evening | ORAL | Status: DC | PRN

## 2014-12-28 MED ORDER — ACETAMINOPHEN 325 MG PO TABS
650.0000 mg | ORAL_TABLET | Freq: Once | ORAL | Status: AC
  Administered 2014-12-28: 650 mg via ORAL
  Filled 2014-12-28: qty 2

## 2014-12-28 MED ORDER — PANTOPRAZOLE SODIUM 40 MG PO TBEC
40.0000 mg | DELAYED_RELEASE_TABLET | Freq: Every day | ORAL | Status: DC
  Administered 2014-12-28 – 2014-12-30 (×3): 40 mg via ORAL
  Filled 2014-12-28 (×3): qty 1

## 2014-12-28 NOTE — Progress Notes (Signed)
Rituxan infusion restarted at 50 mgs/hr, tolerated well then bumped to 100 mgs/hr. So far tolerating well.

## 2014-12-28 NOTE — Progress Notes (Signed)
Chemotherapy consent signed.

## 2014-12-28 NOTE — Progress Notes (Signed)
Tolerating Rituxan at '150mg'$ s/hr rate.  To continue close monitoring.

## 2014-12-28 NOTE — Progress Notes (Signed)
Patient is on her 150 mgs dose of rituxan, then she complained of sore throat without swallowing difficulties, ear ache and eye pressure,also has neck pain. Dose reduced to 50 mgs, vital signs monitored,Dr gorsuch made aware, benadryl iv, decadron  And famotidine given as per protocol, patient still having eye pressure  And neck pain after 15 min, Rituxan stopped completely, to continue monitoring patient  Closely.

## 2014-12-28 NOTE — H&P (Signed)
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 female is here because of the need for admission for high-dose chemotherapy. Summary of oncologic history is as follows:   History of hodgkin's lymphoma   05/20/2013 Initial Diagnosis Hodgkin lymphoma   06/27/2014 Imaging Interval increase in metabolic activity of several small lymph nodes is concerning for lymphoma recurrence. Lymph nodes include a small right level 2 lymph node, right hilar lymph node, and right paratracheal lymph node   07/07/2014 Pathology Results  limited tissue from ultrasound-guided biopsy was nondiagnostic   07/07/2014 Procedure  ultrasound-guided biopsy was nondiagnostic   09/27/2014 Imaging Repeat PET CT scan show diffuse disease concern for relapse.   10/17/2014 Pathology Results Accession: [id number] Biopsy was nondiagnostic   10/17/2014 Procedure She underwent bronchoscopy & EBUS & biopsy of  LN at Level 10L   11/01/2014 Surgery She underwent cronchoscopy with endobronchial ultrasound, mediastinoscopy and biopsy    11/01/2014 Pathology Results Accession: [id number] biopsy showed no evidence of lymphoma, only granulomas.   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 She complained of intermittent droopiness of her left eyelid. She complained of persistent lymphadenopathy over the left parotid region. Otherwise she has no new complaints   MEDICAL HISTORY:  Past Medical History  Diagnosis Date  . Colon polyps   . Depression   . Hypothyroidism   . Osteoarthritis     hands  . Peripheral vascular disease (HCC)   . Degenerative disk disease     spine in center in past   . 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 Cancer (HCC)     skin cancer- basal cell on arm / colon 2011  . Lymphoma (HCC)   . Colon cancer (HCC)     08-2009  . Neuropathy due to chemotherapeutic drug (HCC) 06/19/2014  . Cancer (HCC)     colon CA and lung CA  . Heart murmur   . GERD (gastroesophageal reflux disease)   . Anemia     hx blood tx  . Pneumonia   . 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 (HCC) 12/11/2014    SURGICAL HISTORY: Past Surgical History  Procedure Laterality Date  . Cholecystectomy    . Splenectomy      lymphoma  . Breast biopsy  1996  . Plantar fascia release    . Ventral hernia repair  1998  . Tendon release      Right thumb   .   Colectomy  8/11  . Vaginal hysterectomy    . Bone marrow biopsy  05/27/13  . Cataract extraction, bilateral  2016  . Port-a-cath insertion    . Colonoscopy w/ biopsies    . Dg biopsy lung    . Video bronchoscopy with endobronchial ultrasound N/A 10/17/2014    Procedure: VIDEO BRONCHOSCOPY WITH ENDOBRONCHIAL ULTRASOUND;  Surgeon: Javier Glazier, MD;  Location: [place];  Service: Thoracic;  Laterality: N/A;  . Video  bronchoscopy with endobronchial ultrasound N/A 11/01/2014    Procedure: VIDEO BRONCHOSCOPY WITH ENDOBRONCHIAL ULTRASOUND;  Surgeon: Grace Isaac, MD;  Location: [place];  Service: Thoracic;  Laterality: N/A;  . Mediastinoscopy N/A 11/01/2014    Procedure: MEDIASTINOSCOPY;  Surgeon: Grace Isaac, MD;  Location: [place];  Service: Thoracic;  Laterality: N/A;  . Lymph node biopsy N/A 11/01/2014    Procedure: MEDIASTINAL LYMPH NODE BIOPSY;  Surgeon: Grace Isaac, MD;  Location: [place];  Service: Thoracic;  Laterality: N/A;    SOCIAL HISTORY: Social History   Social History  . Marital Status: Married    Spouse Name: N/A  . Number of Children: 2  . Years of Education: N/A   Occupational History  . Retired     Social History Main Topics  . Smoking status: Never Smoker   . Smokeless tobacco: Never Used     Comment: Second-hand exposure through father.  . Alcohol Use: No  . Drug Use: No  . Sexual Activity: Not on file   Other Topics Concern  . Not on file   Social History Narrative   Originally from [place]. Always lived in [place]. Previously has traveled to [facility], [place] & up [employer] to [place]. No international travel. No pets currently. Remote parakeet exposure with her children. 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        FAMILY HISTORY: Family History  Problem Relation Age of Onset  . Coronary artery disease Mother   . Alcohol abuse Mother   . Diabetes Mother   . Cancer Father     brain  . Diabetes Brother   . Fibromyalgia Daughter     chronic pain   . COPD Daughter   . Colon cancer Neg Hx   . Colon polyps Neg Hx   . Stomach cancer Neg Hx   . Rectal cancer Neg Hx   . Ulcerative colitis Neg Hx   . Crohn's disease Neg Hx   . Asthma Daughter   . Rheum arthritis Brother   . Clotting disorder Brother     ALLERGIES:  is allergic to achromycin; allopurinol; astelin; cephalexin; codeine; meloxicam; minocycline; nabumetone; nyquil; penicillins; sulfa  antibiotics; zolpidem tartrate; buspar; and ciprofloxacin.  MEDICATIONS:  Current Facility-Administered Medications  Medication Dose Route Frequency Provider Last Rate Last Dose  . acetaminophen (TYLENOL) tablet 1,000 mg  1,000 mg Oral Q6H PRN [name], MD      . cholecalciferol (VITAMIN D) tablet 1,000 Units  1,000 Units Oral Daily [name], MD      . estradiol (ESTRACE) tablet 1 mg  1 mg Oral Daily Tonnya Garbett, MD      . FLUoxetine (PROZAC) capsule 40 mg  40 mg Oral Daily Forever Arechiga, MD      . gabapentin (NEURONTIN) capsule 300 mg  300 mg Oral BID Kardell Virgil, MD      . levothyroxine (SYNTHROID, LEVOTHROID) tablet 112 mcg  112 mcg Oral QAC breakfast [name], MD      . pantoprazole (  PROTONIX) EC tablet 40 mg  40 mg Oral Daily [name], MD      . simvastatin (ZOCOR) tablet 40 mg  40 mg Oral q1800 [name], MD        REVIEW OF SYSTEMS:   Constitutional: Denies fevers, chills or abnormal night sweats Eyes: Denies blurriness of vision, double vision or watery eyes Ears, nose, mouth, throat, and face: Denies mucositis or sore throat Respiratory: Denies cough, dyspnea or wheezes Cardiovascular: Denies palpitation, chest discomfort or lower extremity swelling Gastrointestinal:  Denies nausea, heartburn or change in bowel habits Skin: Denies abnormal skin rashes Lymphatics: Denies easy bruising Neurological:Denies numbness, tingling or new weaknesses Behavioral/Psych: Mood is stable, no new changes  All other systems were reviewed with the patient and are negative.  PHYSICAL EXAMINATION: ECOG PERFORMANCE STATUS: 1 - Symptomatic but completely ambulatory GENERAL:alert, no distress and comfortable SKIN: skin color, texture, turgor are normal, no rashes or significant lesions EYES: normal, conjunctiva are pink and non-injected, sclera clear OROPHARYNX:no exudate, no erythema and lips, buccal mucosa, and tongue normal  NECK: supple, thyroid normal size, non-tender, without  nodularity LYMPH:  no palpable lymphadenopathy in the cervical, axillary or inguinal LUNGS: clear to auscultation and percussion with normal breathing effort HEART: regular rate & rhythm and no murmurs and no lower extremity edema ABDOMEN:abdomen soft, non-tender and normal bowel sounds Musculoskeletal:no cyanosis of digits and no clubbing  PSYCH: alert & oriented x 3 with fluent speech NEURO: no focal motor/sensory deficits  LABORATORY DATA:  I have reviewed the data as listed Lab Results  Component Value Date   WBC 7.0 12/25/2014   HGB 12.7 12/25/2014   HCT 38.6 12/25/2014   MCV 91.9 12/25/2014   PLT 217 12/25/2014    Recent Labs  09/27/14 1045 10/11/14 1329 11/01/14 0708 12/25/14 1028  NA 142 139 140 140  K 4.2 4.0 3.8 3.9  CL  --  105 104  --   CO2 _0 GLUCOSE 92 133* 96 102  BUN 13.0 7 11 9.5  CREATININE 0.8 0.74 0.77 0.7  CALCIUM 9.4 9.4 9.4 9.2  GFRNONAA  --  >60 >60  --   GFRAA  --  >60 >60  --   PROT 6.3*  --  6.4* 6.0*  ALBUMIN 4.0  --  4.0 3.7  Boyd 25  --  29 29  ALT 20  --  22 26  ALKPHOS 156*  --  123 126  BILITOT 0.57  --  0.5 0.45    RADIOGRAPHIC STUDIES: I have personally reviewed the radiological images as listed and agreed with the findings in the report. Nm Pet Image Restag (ps) Skull Base To Thigh  12/13/2014  CLINICAL DATA:  Subsequent treatment strategy for lymphoma. EXAM: NUCLEAR MEDICINE PET SKULL BASE TO THIGH TECHNIQUE: 11.5 mCi F-18 FDG was injected intravenously. Full-ring PET imaging was performed from the skull base to thigh after the radiotracer. CT data was obtained and used for attenuation correction and anatomic localization. FASTING BLOOD GLUCOSE:  Value: 89 mg/dl COMPARISON:  09/27/2014 FINDINGS: NECK Lymph node within the left parotid gland measures 1.3 cm and has an SUV max equal to 15.8. Previously 1 cm with an SUV max equal to 5.8. Abnormal asymmetric uptake within the right parotid gland has an SUV max equal to 8.47.  Previously 10.47. New abnormal FDG uptake localizing to the left submandibular gland has an SUV max equal to 9.25. There is diffuse uptake throughout both lobes of the thyroid gland  as before. CHEST Persistently hypermetabolic mediastinal and hilar lymph nodes are identified. The SUV max associated with the left hilar region is equal to 11.27. On the previous exam 17.14. SUV max associated with the right hilar region is equal to 11.59. Previously 9.9. No hypermetabolic pulmonary nodules identified. ABDOMEN/PELVIS new hypermetabolic retrocaval lymph node is identified within the upper abdomen. This measures 6 mm. The SUV max is equal to 6.68. Hypermetabolic tumor within the axial skeleton is again noted. SUV max associated with the tumor involving the right sacral ala is equal to 20.26. Previously 13.9. New lesion within the left iliac wing adjacent to the SI joint has an SUV max equal to 11.9. Hypermetabolic tumor within the left iliac crest has an SUV max equal to 10.9. New from previous study. Also new from previous exam is hypermetabolic lesion within the posterior column of left acetabulum. This has an SUV max equal to 14.54. SKELETON No focal hypermetabolic activity to suggest skeletal metastasis. IMPRESSION: 1. Overall interval progression of disease. Multifocal hypermetabolic bone lesions are increased in number and degree of FDG uptake. 2. Increase in hypermetabolic tumor involving the parotid glands and left submandibular gland. There is also a new hypermetabolic retrocaval node within the upper abdomen. 3. Stable appearance of thoracic hypermetabolic adenopathy. Electronically Signed   By: Kerby Moors M.D.   On: 12/13/2014 12:13    ASSESSMENT & PLAN:  High grade Follicular lymphoma Per recommendation from [facility], we will administer high-dose chemotherapy with RICE regimen. The risks, benefits, side effects of rituximab, ifosfamide, carboplatin, etoposide with mesna were discussed with the  patient and she agreed to proceed.  Peripheral neuropathy I will reduce the dose of carboplatin a little bit in view of existing peripheral neuropathy. She will continue on Gabapentin  Lymphadenopathy and droopiness of the left eyelid This is due to disease over the left facial nerve. Recommend observation only at this point  Hypothyroidism We will resume levothyroxine  DVT prophylaxis On lovenox  CODE STATUS Full code  Discharge planning Hopefully, she can be discharged home on 12/30/2014 after treatment is completed  All questions were answered. The patient knows to call the clinic with any problems, questions or concerns.    [place], [place], MD 12/28/2014 8:30 AM

## 2014-12-28 NOTE — Progress Notes (Addendum)
rituxan dosage and calculations done with Drue Dun RN.Dosage calculations done for Vepesid also.

## 2014-12-29 ENCOUNTER — Ambulatory Visit: Payer: Medicare Other

## 2014-12-29 LAB — COMPREHENSIVE METABOLIC PANEL
ALBUMIN: 3.7 g/dL (ref 3.5–5.0)
ALT: 50 U/L (ref 14–54)
ANION GAP: 7 (ref 5–15)
AST: 65 U/L — AB (ref 15–41)
Alkaline Phosphatase: 126 U/L (ref 38–126)
BILIRUBIN TOTAL: 0.5 mg/dL (ref 0.3–1.2)
BUN: 12 mg/dL (ref 6–20)
CALCIUM: 8.9 mg/dL (ref 8.9–10.3)
CO2: 24 mmol/L (ref 22–32)
Chloride: 108 mmol/L (ref 101–111)
Creatinine, Ser: 0.68 mg/dL (ref 0.44–1.00)
GFR calc Af Amer: >60 mL/min (ref >60)
GFR calc non Af Amer: >60 mL/min (ref >60)
GLUCOSE: 131 mg/dL — AB (ref 65–99)
POTASSIUM: 4.3 mmol/L (ref 3.5–5.1)
Sodium: 139 mmol/L (ref 135–145)
TOTAL PROTEIN: 6 g/dL — AB (ref 6.5–8.1)

## 2014-12-29 LAB — LACTATE DEHYDROGENASE: LDH: 167 U/L (ref 98–192)

## 2014-12-29 LAB — URIC ACID: Uric Acid, Serum: 5.3 mg/dL (ref 2.3–6.6)

## 2014-12-29 MED ORDER — SODIUM CHLORIDE 0.9 % IV SOLN
Freq: Once | INTRAVENOUS | Status: AC
  Administered 2014-12-29: 14:00:00 via INTRAVENOUS
  Filled 2014-12-29: qty 226

## 2014-12-29 MED ORDER — OXYCODONE HCL 5 MG PO TABS
10.0000 mg | ORAL_TABLET | Freq: Once | ORAL | Status: AC
  Administered 2014-12-30: 10 mg via ORAL
  Filled 2014-12-29: qty 2

## 2014-12-29 MED ORDER — SODIUM CHLORIDE 0.9 % IV SOLN
100.0000 mg/m2 | Freq: Once | INTRAVENOUS | Status: AC
  Administered 2014-12-29: 230 mg via INTRAVENOUS
  Filled 2014-12-29: qty 11.5

## 2014-12-29 MED ORDER — SODIUM CHLORIDE 0.9 % IV SOLN
10.0000 mg | Freq: Once | INTRAVENOUS | Status: AC
  Administered 2014-12-29: 10 mg via INTRAVENOUS
  Filled 2014-12-29: qty 1

## 2014-12-29 MED ORDER — ACETAMINOPHEN 500 MG PO TABS
1000.0000 mg | ORAL_TABLET | Freq: Once | ORAL | Status: AC
  Administered 2014-12-30: 1000 mg via ORAL
  Filled 2014-12-29: qty 2

## 2014-12-29 MED ORDER — CARBOPLATIN CHEMO INJECTION 600 MG/60ML
430.0000 mg | Freq: Once | INTRAVENOUS | Status: AC
  Administered 2014-12-29: 430 mg via INTRAVENOUS
  Filled 2014-12-29: qty 43

## 2014-12-29 NOTE — Progress Notes (Signed)
On Call [employer] Note  Patient's RN called reporting occipital headache and cervicalgia and shoulder pain on and off since this AM. Patient previously tylenol and oxycodone for the headache with relief. RN reports no somnolence, confusion, dizziness, disorientation, hallucinations, extrapyramidal symptoms, seizures, blurred vision, and/or urinary incontinence. Currently completing Ifosfamide. ? Tension headache .BP 157/71 mmHg  Pulse 67  Temp(Src) 97.6 F (36.4 C) (Oral)  Resp 20  Ht '5\' 6"'$  (1.676 m)  Wt 240 lb (108.863 kg)  BMI 38.76 kg/m2  SpO2 97%  LMP 02/10/1970  Plan -give additional tylenol dose + oxycodone now. -MRI brain w/wo contrast to r/o PRES, CNS lymphoma involvement -if worsening headache despite pain meds or if associated with any symptoms suggestive of encephalopathy would need to hold further Ifosfamide.  Sullivan Lone MD MS

## 2014-12-29 NOTE — Progress Notes (Signed)
Diamond Boyd   DOB:10/31/1940   FO#:[id number]   MVE#:[id number]  Patient Care Team: Abner Greenspan, MD as PCP - General Mosetta Anis, MD (Allergy) Fanny Skates, MD as Consulting Physician (General Surgery) Grace Isaac, MD as Consulting Physician (Cardiothoracic Surgery)  I have seen the patient, examined her and edited the notes as follows  Subjective: Patient seen and examined. Events of 12/28/2014 noted. She had a reaction to Rituxan as evidenced by sore throat with those swallowing problems, earache, and I pressure as well as neck pain, felt to be rate related, treated with IV Benadryl, Decadron and PPI, restarted at a slower rate until reaching its desired rate of 150 mg/h, then with better tolerance. No new events overnight. Denies fevers, chills, night sweats, vision changes, or mucositis. Denies any respiratory complaints. Denies any chest pain or palpitations. Denies lower extremity swelling. Denies nausea, heartburn or change in bowel habits. Last bowel movement on   Appetite is normal. Denies any dysuria. Denies abnormal skin rashes, or neuropathy. Denies any bleeding issues such as epistaxis, hematemesis, hematuria or hematochezia. Ambulating without difficulty.  Scheduled Meds: . cholecalciferol  1,000 Units Oral Daily  . enoxaparin (LOVENOX) injection  40 mg Subcutaneous Q24H  . estradiol  1 mg Oral Daily  . FLUoxetine  40 mg Oral Daily  . gabapentin  300 mg Oral BID  . levothyroxine  112 mcg Oral QAC breakfast  . lidocaine-prilocaine   Topical Once  . pantoprazole  40 mg Oral Daily  . simvastatin  40 mg Oral q1800   Continuous Infusions: . sodium chloride 50 mL/hr at 12/29/14 0539  . sodium chloride     PRN Meds:.acetaminophen, alteplase, alum & mag hydroxide-simeth, heparin lock flush, heparin lock flush, ondansetron **OR** ondansetron **OR** ondansetron (ZOFRAN) IV **OR** ondansetron (ZOFRAN) IV, oxyCODONE, senna-docusate, sodium chloride, sodium  chloride  Objective:  Filed Vitals:   12/29/14 0536  BP: 142/55  Pulse: 82  Temp: 98.2 F (36.8 C)  Resp: 20      Intake/Output Summary (Last 24 hours) at 12/29/14 0726 Last data filed at 12/29/14 0539  Gross per 24 hour  Intake    960 ml  Output   3100 ml  Net  -2140 ml    ECOG PERFORMANCE STATUS:1  GENERAL:alert, no distress and comfortable SKIN: skin color, texture, turgor are normal, no rashes or significant lesions EYES: normal, conjunctiva are pink and non-injected, sclera clear OROPHARYNX:no exudate, no erythema and lips, buccal mucosa, and tongue normal  NECK: supple, thyroid normal size, non-tender, without nodularity LYMPH:  no palpable lymphadenopathy in the cervical, axillary or inguinal LUNGS: clear to auscultation and percussion with normal breathing effort HEART: regular rate & rhythm and no murmurs and no lower extremity edema ABDOMEN: soft, non-tender and normal bowel sounds Musculoskeletal:no cyanosis of digits and no clubbing  PSYCH: alert & oriented x 3 with fluent speech NEURO: no focal motor/sensory deficits    CBG (last 3)  No results for input(s): GLUCAP in the last 72 hours.   Labs:   Recent Labs Lab 12/25/14 1028  WBC 7.0  HGB 12.7  HCT 38.6  PLT 217  MCV 91.9  MCH 30.2  MCHC 32.9  RDW 14.7*  LYMPHSABS 2.9  MONOABS 1.1*  EOSABS 0.3  BASOSABS 0.1     Chemistries:    Recent Labs Lab 12/25/14 1028 12/29/14 0542  NA 140 139  K 3.9 4.3  CL  --  108  CO2 27 24  GLUCOSE 102 131*  BUN  9.5 12  CREATININE 0.7 0.68  CALCIUM 9.2 8.9  AST 29 65*  ALT 26 50  ALKPHOS 126 126  BILITOT 0.45 0.5    GFR Estimated Creatinine Clearance: 75.9 mL/min (by C-G formula based on Cr of 0.68).  Liver Function Tests:  Recent Labs Lab 12/25/14 1028 12/29/14 0542  AST 29 65*  ALT 26 50  ALKPHOS 126 126  BILITOT 0.45 0.5  PROT 6.0* 6.0*  ALBUMIN 3.7 3.7   Assessment/Plan: 74 y.o. High grade Follicular lymphoma Per  recommendation from [facility], she was admitted for high-dose chemotherapy with RICE regimen. The risks, benefits, side effects of rituximab, ifosfamide, carboplatin, etoposide with mesna were discussed with the patient and she agreed to proceed.  She had an initial reaction to Rituxan as evidenced by sore throat with those swallowing problems, earache, and I pressure as well as neck pain, felt to be rate related, treated with IV Benadryl, Decadron and PPI, restarted at a slower rate until reaching its desired rate of 150 mg/h, then with better tolerance and no further complications. We will proceed with day 2 of chemotherapy as scheduled, and monitor closely. No signs of tumor lysis so far  Peripheral neuropathy The dose of carboplatin was reduced in view of existing peripheral neuropathy. She will continue on Gabapentin  Lymphadenopathy and droopiness of the left eyelid This is due to disease over the left facial nerve. Recommend observation only at this point  Hypothyroidism Levothyroxine was resumed  DVT prophylaxis On lovenox  CODE STATUS Full code  Discharge planning Hopefully, she can be discharged home on 12/30/2014 after treatment is completed   Rondel Jumbo, PA-C 12/29/2014  7:26 AM Daniqua Campoy, MD 12/29/2014

## 2014-12-29 NOTE — Progress Notes (Signed)
Doasages/dilutions for Ifos/Mesna and carboplatin verified with 2nd RN Reyne Dumas

## 2014-12-30 ENCOUNTER — Inpatient Hospital Stay (HOSPITAL_COMMUNITY): Payer: Medicare Other

## 2014-12-30 LAB — COMPREHENSIVE METABOLIC PANEL
ALK PHOS: 135 U/L — AB (ref 38–126)
ALT: 108 U/L — AB (ref 14–54)
ANION GAP: 8 (ref 5–15)
AST: 108 U/L — ABNORMAL HIGH (ref 15–41)
Albumin: 3.9 g/dL (ref 3.5–5.0)
BUN: 16 mg/dL (ref 6–20)
CHLORIDE: 107 mmol/L (ref 101–111)
CO2: 24 mmol/L (ref 22–32)
Calcium: 9 mg/dL (ref 8.9–10.3)
Creatinine, Ser: 0.61 mg/dL (ref 0.44–1.00)
GLUCOSE: 126 mg/dL — AB (ref 65–99)
POTASSIUM: 4.1 mmol/L (ref 3.5–5.1)
Sodium: 139 mmol/L (ref 135–145)
Total Bilirubin: 0.9 mg/dL (ref 0.3–1.2)
Total Protein: 6.4 g/dL — ABNORMAL LOW (ref 6.5–8.1)

## 2014-12-30 LAB — LACTATE DEHYDROGENASE: LDH: 189 U/L (ref 98–192)

## 2014-12-30 LAB — URIC ACID: Uric Acid, Serum: 4.7 mg/dL (ref 2.3–6.6)

## 2014-12-30 MED ORDER — FUROSEMIDE 10 MG/ML IJ SOLN
20.0000 mg | Freq: Once | INTRAMUSCULAR | Status: AC
  Administered 2014-12-30: 20 mg via INTRAVENOUS
  Filled 2014-12-30: qty 2

## 2014-12-30 MED ORDER — DEXAMETHASONE SODIUM PHOSPHATE 100 MG/10ML IJ SOLN
10.0000 mg | Freq: Once | INTRAMUSCULAR | Status: AC
  Administered 2014-12-30: 10 mg via INTRAVENOUS
  Filled 2014-12-30: qty 1

## 2014-12-30 MED ORDER — HEPARIN SOD (PORK) LOCK FLUSH 100 UNIT/ML IV SOLN
500.0000 [IU] | Freq: Once | INTRAVENOUS | Status: AC
  Administered 2014-12-30: 500 [IU]
  Filled 2014-12-30: qty 5

## 2014-12-30 MED ORDER — SODIUM CHLORIDE 0.9 % IV SOLN
100.0000 mg/m2 | Freq: Once | INTRAVENOUS | Status: AC
  Administered 2014-12-30: 230 mg via INTRAVENOUS
  Filled 2014-12-30: qty 11.5

## 2014-12-30 MED ORDER — ALBUTEROL SULFATE (2.5 MG/3ML) 0.083% IN NEBU
2.5000 mg | INHALATION_SOLUTION | Freq: Four times a day (QID) | RESPIRATORY_TRACT | Status: DC
  Administered 2014-12-30 (×3): 2.5 mg via RESPIRATORY_TRACT
  Filled 2014-12-30 (×3): qty 3

## 2014-12-30 NOTE — Discharge Summary (Signed)
Physician Discharge Summary  Patient ID: Diamond Boyd MRN: [id number] DOB/AGE: Aug 03, 1940 74 y.o.  Admit date: 12/28/2014 Discharge date: 12/30/2014  Primary Care Physician:  [name], MD   Discharge Diagnoses:    Present on Admission:  . 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 Peripheral neuropathy due to chemotherapy ([place]) . Hypothyroid . 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     CALCIUM 600+D 600-400 MG-UNIT tablet  Generic drug:  Calcium Carbonate-Vitamin D  Take 1 tablet by mouth daily.     celecoxib 200 MG capsule  Commonly known as:  CELEBREX  Take 1 capsule (200 mg total) by mouth daily as needed for moderate pain (with food).     docusate sodium 100 MG capsule  Commonly known as:  COLACE  Take 100 mg by mouth at bedtime.     estradiol 2 MG tablet  Commonly known as:  ESTRACE  Take 0.5 tablets (1 mg total) by mouth daily. Takes 1/2 tablet     fexofenadine 180 MG tablet  Commonly known as:  ALLEGRA  TAKE 1 TABLET (180 MG TOTAL) BY MOUTH DAILY.     FLUoxetine 20 MG capsule  Commonly known as:  PROZAC  Take 20 mg by mouth daily.     fluticasone 50 MCG/ACT nasal spray  Commonly known as:  FLONASE  Place 1 spray into both  nostrils 2 (two) times  daily.     gabapentin 300 MG capsule  Commonly known as:  NEURONTIN  Take 1 capsule (300 mg total) by mouth 2 (two) times daily.     HYDROcodone-acetaminophen 5-325 MG tablet  Commonly known as:  NORCO/VICODIN  Take 1-2 tablets by mouth every 4 (four) hours as needed  (For pain.).     Hydrocodone-Chlorpheniramine 5-4 MG/5ML Soln  Take 5 mLs by mouth 2 (two) times daily as needed. For cough     levothyroxine 112 MCG tablet  Commonly known as:  SYNTHROID, LEVOTHROID  Take 1 tablet by mouth  daily     lidocaine-prilocaine cream  Commonly known as:  EMLA  Apply 1 application topically as needed (For port-a-cath.).     Omega-3 350 MG Caps  Take 1 capsule by mouth daily.     omeprazole 40 MG capsule  Commonly known as:  PRILOSEC  Take 1 capsule by mouth two times daily     ondansetron 8 MG tablet  Commonly known as:  ZOFRAN  Take 8 mg by mouth every 6 (six) hours as needed for nausea or vomiting.     polyethylene glycol packet  Commonly known as:  MIRALAX / GLYCOLAX  Take 17 g by mouth at bedtime.     prochlorperazine 10 MG tablet  Commonly known as:  COMPAZINE  Take 10 mg by mouth every 6 (six) hours as needed.     psyllium 58.6 % packet  Commonly known as:  METAMUCIL  Take 1-3 packets by mouth at bedtime.  Vitamin D-3 1000 UNITS Caps  Take 1,000 Units by mouth daily.     vitamin E 400 UNIT capsule  Take 400 Units by mouth daily.         Disposition and Follow-up:   Significant Diagnostic Studies:  Nm Pet Image Restag (ps) Skull Base To Thigh  12/13/2014  CLINICAL DATA:  Subsequent treatment strategy for lymphoma. EXAM: NUCLEAR MEDICINE PET SKULL BASE TO THIGH TECHNIQUE: 11.5 mCi F-18 FDG was injected intravenously. Full-ring PET imaging was performed from the skull base to thigh after the radiotracer. CT data was obtained and used for attenuation correction and anatomic localization. FASTING BLOOD GLUCOSE:  Value: 89 mg/dl COMPARISON:  09/27/2014 FINDINGS: NECK Lymph node within the left parotid gland measures 1.3 cm and has an SUV max equal to 15.8. Previously 1 cm with an SUV max equal to 5.8. Abnormal asymmetric uptake within the right parotid gland has an SUV max equal to 8.47. Previously 10.47. New abnormal FDG uptake  localizing to the left submandibular gland has an SUV max equal to 9.25. There is diffuse uptake throughout both lobes of the thyroid gland as before. CHEST Persistently hypermetabolic mediastinal and hilar lymph nodes are identified. The SUV max associated with the left hilar region is equal to 11.27. On the previous exam 17.14. SUV max associated with the right hilar region is equal to 11.59. Previously 9.9. No hypermetabolic pulmonary nodules identified. ABDOMEN/PELVIS new hypermetabolic retrocaval lymph node is identified within the upper abdomen. This measures 6 mm. The SUV max is equal to 6.68. Hypermetabolic tumor within the axial skeleton is again noted. SUV max associated with the tumor involving the right sacral ala is equal to 20.26. Previously 13.9. New lesion within the left iliac wing adjacent to the SI joint has an SUV max equal to 11.9. Hypermetabolic tumor within the left iliac crest has an SUV max equal to 10.9. New from previous study. Also new from previous exam is hypermetabolic lesion within the posterior column of left acetabulum. This has an SUV max equal to 14.54. SKELETON No focal hypermetabolic activity to suggest skeletal metastasis. IMPRESSION: 1. Overall interval progression of disease. Multifocal hypermetabolic bone lesions are increased in number and degree of FDG uptake. 2. Increase in hypermetabolic tumor involving the parotid glands and left submandibular gland. There is also a new hypermetabolic retrocaval node within the upper abdomen. 3. Stable appearance of thoracic hypermetabolic adenopathy. Electronically Signed   By: Kerby Moors M.D.   On: 12/13/2014 12:13   Dg Chest Port 1 View  12/30/2014  CLINICAL DATA:  Shortness of breath. EXAM: PORTABLE CHEST 1 VIEW COMPARISON:  Chest radiograph 11/01/2014.  PET-CT 12/13/2014 FINDINGS: Tip of the right chest port in the SVC. Mild cardiomegaly is unchanged. Development of mild pulmonary edema. Question left pleural effusion.  Ill-defined linear opacities at the lung bases, likely atelectasis. No pneumothorax. No confluent airspace disease. IMPRESSION: 1. Development of pulmonary edema.  Suspect left pleural effusion. 2. Stable mild cardiomegaly. Electronically Signed   By: Jeb Levering M.D.   On: 12/30/2014 05:57    Discharge Laboratory Values: Lab Results  Component Value Date   WBC 7.0 12/25/2014   HGB 12.7 12/25/2014   HCT 38.6 12/25/2014   MCV 91.9 12/25/2014   PLT 217 12/25/2014   Lab Results  Component Value Date   NA 139 12/30/2014   K 4.1 12/30/2014   CL 107 12/30/2014   CO2 24 12/30/2014    Brief H and P: For complete details please refer  to admission H and P, but in brief, she was admitted for cycle 1 of RICE  Physical Exam at Discharge: BP 169/75 mmHg  Pulse 79  Temp(Src) 97.7 F (36.5 C) (Oral)  Resp 20  Ht '5\' 6"'$  (1.676 m)  Wt 240 lb (108.863 kg)  BMI 38.76 kg/m2  SpO2 98%  LMP 02/10/1970   Hospital Course:  Active Problems:   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 The risks, benefits, side effects of rituximab, ifosfamide, carboplatin, etoposide with mesna were discussed with the patient and she agreed to proceed. She had an initial reaction to Rituxan as evidenced by sore throat with those swallowing problems, earache, and I pressure as well as neck pain, felt to be rate related, treated with IV Benadryl, Decadron and PPI, restarted at a slower rate until reaching its desired rate of 150 mg/h, then with better tolerance and no further complications. With due to of therapy, she has significant headache which subsequently resolved with Tylenol and oxycodone. She has no radiological  deficit. Imaging study of the head was not indicated No signs of tumor lysis so far  Peripheral neuropathy The dose of carboplatin was reduced in view of existing peripheral neuropathy. She will continue on Gabapentin  Lymphadenopathy and droopiness of the left eyelid This is due to disease over the left facial nerve. Recommend observation only at this point  Mild fluid overload with elevated BP She complained of some mild wheezing and chest pressure. Chest x-ray show mild volume overload. IV fluid was discontinued and she received multiple doses of IV Lasix with good urine output.  Hypothyroidism Levothyroxine was resumed  Diet:  regular  Activity:  As tolerated  Condition at Discharge:   stable  Signed: Dr. [name] [phone]  12/30/2014, 7:12 AM

## 2014-12-30 NOTE — Progress Notes (Signed)
Patient discharged to home with family, discharge instructions reviewed with patient who verbalized understanding. 

## 2014-12-31 ENCOUNTER — Other Ambulatory Visit: Payer: Self-pay | Admitting: Hematology and Oncology

## 2015-01-01 ENCOUNTER — Ambulatory Visit (HOSPITAL_BASED_OUTPATIENT_CLINIC_OR_DEPARTMENT_OTHER): Payer: Medicare Other

## 2015-01-01 VITALS — BP 140/63 | HR 90 | Temp 100.2°F

## 2015-01-01 DIAGNOSIS — C8228 Follicular lymphoma grade III, unspecified, lymph nodes of multiple sites: Secondary | ICD-10-CM

## 2015-01-01 DIAGNOSIS — Z5189 Encounter for other specified aftercare: Secondary | ICD-10-CM | POA: Diagnosis not present

## 2015-01-01 DIAGNOSIS — C8110 Nodular sclerosis classical Hodgkin lymphoma, unspecified site: Secondary | ICD-10-CM

## 2015-01-01 MED ORDER — PEGFILGRASTIM INJECTION 6 MG/0.6ML ~~LOC~~
6.0000 mg | PREFILLED_SYRINGE | Freq: Once | SUBCUTANEOUS | Status: AC
  Administered 2015-01-01: 6 mg via SUBCUTANEOUS
  Filled 2015-01-01: qty 0.6

## 2015-01-02 ENCOUNTER — Other Ambulatory Visit: Payer: Self-pay | Admitting: *Deleted

## 2015-01-02 DIAGNOSIS — C8228 Follicular lymphoma grade III, unspecified, lymph nodes of multiple sites: Secondary | ICD-10-CM

## 2015-01-02 DIAGNOSIS — C8590 Non-Hodgkin lymphoma, unspecified, unspecified site: Secondary | ICD-10-CM | POA: Diagnosis not present

## 2015-01-03 ENCOUNTER — Other Ambulatory Visit (HOSPITAL_BASED_OUTPATIENT_CLINIC_OR_DEPARTMENT_OTHER): Payer: Medicare Other

## 2015-01-03 DIAGNOSIS — C8228 Follicular lymphoma grade III, unspecified, lymph nodes of multiple sites: Secondary | ICD-10-CM

## 2015-01-03 LAB — CBC WITH DIFFERENTIAL/PLATELET
BASO%: 0.7 % (ref 0.0–2.0)
BASOS ABS: 0.1 10*3/uL (ref 0.0–0.1)
EOS%: 1.2 % (ref 0.0–7.0)
Eosinophils Absolute: 0.1 10*3/uL (ref 0.0–0.5)
HCT: 32.5 % — ABNORMAL LOW (ref 34.8–46.6)
HGB: 11 g/dL — ABNORMAL LOW (ref 11.6–15.9)
LYMPH%: 6.3 % — AB (ref 14.0–49.7)
MCH: 30.6 pg (ref 25.1–34.0)
MCHC: 33.8 g/dL (ref 31.5–36.0)
MCV: 90.5 fL (ref 79.5–101.0)
MONO#: 0 10*3/uL — ABNORMAL LOW (ref 0.1–0.9)
MONO%: 0.2 % (ref 0.0–14.0)
NEUT#: 10 10*3/uL — ABNORMAL HIGH (ref 1.5–6.5)
NEUT%: 91.6 % — AB (ref 38.4–76.8)
Platelets: 115 10*3/uL — ABNORMAL LOW (ref 145–400)
RBC: 3.59 10*6/uL — ABNORMAL LOW (ref 3.70–5.45)
RDW: 14.9 % — ABNORMAL HIGH (ref 11.2–14.5)
WBC: 10.9 10*3/uL — ABNORMAL HIGH (ref 3.9–10.3)
lymph#: 0.7 10*3/uL — ABNORMAL LOW (ref 0.9–3.3)
nRBC: 0 % (ref 0–0)

## 2015-01-03 LAB — HOLD TUBE, BLOOD BANK

## 2015-01-08 ENCOUNTER — Encounter: Payer: Self-pay | Admitting: Family Medicine

## 2015-01-08 ENCOUNTER — Ambulatory Visit (INDEPENDENT_AMBULATORY_CARE_PROVIDER_SITE_OTHER): Payer: Medicare Other | Admitting: Family Medicine

## 2015-01-08 VITALS — BP 128/62 | HR 98 | Temp 101.2°F | Ht 66.0 in | Wt 237.2 lb

## 2015-01-08 DIAGNOSIS — B999 Unspecified infectious disease: Secondary | ICD-10-CM | POA: Diagnosis not present

## 2015-01-08 DIAGNOSIS — J0101 Acute recurrent maxillary sinusitis: Secondary | ICD-10-CM

## 2015-01-08 DIAGNOSIS — D849 Immunodeficiency, unspecified: Secondary | ICD-10-CM | POA: Diagnosis not present

## 2015-01-08 DIAGNOSIS — Z8571 Personal history of Hodgkin lymphoma: Secondary | ICD-10-CM

## 2015-01-08 MED ORDER — AZITHROMYCIN 250 MG PO TABS: ORAL_TABLET | ORAL | Status: DC

## 2015-01-08 NOTE — Progress Notes (Signed)
   Subjective:    Patient ID: Diamond Boyd, female    DOB: [birth year], 74 y.o.   MRN: [id number]  Sinusitis This is a new problem. The current episode started in the past 7 days. The maximum temperature recorded prior to her arrival was 101 - 101.9 F. The fever has been present for 1 to 2 days. Her pain is at a severity of 8/10. The pain is moderate. Associated symptoms include congestion, ear pain and sinus pressure. Pertinent negatives include no coughing, hoarse voice, shortness of breath, sore throat or swollen glands. (Sore inside nose.  Pain in right ear since biopsy.) Treatments tried: flonase. The treatment provided mild relief.  Fever  Associated symptoms include congestion and ear pain. Pertinent negatives include no coughing or sore throat.     Social History /Family History/Past Medical History reviewed and updated if needed. Hx of lymphoma and chronic sinusitis. Has not had antibioics in last 6 months.  She is currently recieving chemo in last 2 weeks.  Review of Systems  Constitutional: Positive for fever.  HENT: Positive for congestion, ear pain and sinus pressure. Negative for hoarse voice and sore throat.   Respiratory: Negative for cough and shortness of breath.        Objective:   Physical Exam  Constitutional: Vital signs are normal. She appears well-developed and well-nourished. She is cooperative.  Non-toxic appearance. She does not appear ill. No distress.  HENT:  Head: Normocephalic.  Right Ear: Hearing, external ear and ear canal normal. Tympanic membrane is not erythematous, not retracted and not bulging. A middle ear effusion is present.  Left Ear: Hearing, external ear and ear canal normal. Tympanic membrane is not erythematous, not retracted and not bulging. A middle ear effusion is present.  Nose: Mucosal edema and rhinorrhea present. Right sinus exhibits maxillary sinus tenderness. Right sinus exhibits no frontal sinus tenderness. Left sinus exhibits  maxillary sinus tenderness. Left sinus exhibits no frontal sinus tenderness.  Mouth/Throat: Uvula is midline, oropharynx is clear and moist and mucous membranes are normal. No oropharyngeal exudate, posterior oropharyngeal edema or posterior oropharyngeal erythema.  Eyes: Conjunctivae, EOM and lids are normal. Pupils are equal, round, and reactive to light. Lids are everted and swept, no foreign bodies found.  Neck: Trachea normal and normal range of motion. Neck supple. Carotid bruit is not present. No thyroid mass and no thyromegaly present.  Cardiovascular: Normal rate, regular rhythm, S1 normal, S2 normal, normal heart sounds, intact distal pulses and normal pulses.  Exam reveals no gallop and no friction rub.   No murmur heard. Pulmonary/Chest: Effort normal and breath sounds normal. No tachypnea. No respiratory distress. She has no decreased breath sounds. She has no wheezes. She has no rhonchi. She has no rales.  Neurological: She is alert.  Skin: Skin is warm, dry and intact. No rash noted.  Psychiatric: Her speech is normal and behavior is normal. Judgment normal. Her mood appears not anxious. Cognition and memory are normal. She does not exhibit a depressed mood.          Assessment & Plan:

## 2015-01-08 NOTE — Assessment & Plan Note (Signed)
In immunocomp pt with hx of chronic sinusitis-now with uri and congestion and facial pain Cover with avelox (she tolerates few meds), Z-pack has not helped well in past per pt.

## 2015-01-08 NOTE — Patient Instructions (Addendum)
Continue flonase 2 sprays per nostril daily. Start Netty pot for irrigation. Complete antibiotics. Call PCP if not improving as expected. Let oncologist know about.

## 2015-01-08 NOTE — Progress Notes (Signed)
Pre visit review using our clinic review tool, if applicable. No additional management support is needed unless otherwise documented below in the visit note. 

## 2015-01-09 DIAGNOSIS — D122 Benign neoplasm of ascending colon: Secondary | ICD-10-CM | POA: Diagnosis not present

## 2015-01-09 DIAGNOSIS — C8590 Non-Hodgkin lymphoma, unspecified, unspecified site: Secondary | ICD-10-CM | POA: Diagnosis not present

## 2015-01-10 ENCOUNTER — Other Ambulatory Visit (HOSPITAL_BASED_OUTPATIENT_CLINIC_OR_DEPARTMENT_OTHER): Payer: Medicare Other

## 2015-01-10 ENCOUNTER — Other Ambulatory Visit: Payer: Self-pay | Admitting: *Deleted

## 2015-01-10 ENCOUNTER — Ambulatory Visit (HOSPITAL_BASED_OUTPATIENT_CLINIC_OR_DEPARTMENT_OTHER): Payer: Medicare Other

## 2015-01-10 ENCOUNTER — Other Ambulatory Visit: Payer: Self-pay | Admitting: Hematology and Oncology

## 2015-01-10 VITALS — BP 132/68 | HR 92 | Temp 99.4°F | Resp 14

## 2015-01-10 DIAGNOSIS — C8228 Follicular lymphoma grade III, unspecified, lymph nodes of multiple sites: Secondary | ICD-10-CM

## 2015-01-10 DIAGNOSIS — R509 Fever, unspecified: Secondary | ICD-10-CM

## 2015-01-10 DIAGNOSIS — Z95828 Presence of other vascular implants and grafts: Secondary | ICD-10-CM

## 2015-01-10 DIAGNOSIS — C8298 Follicular lymphoma, unspecified, lymph nodes of multiple sites: Secondary | ICD-10-CM | POA: Diagnosis not present

## 2015-01-10 LAB — COMPREHENSIVE METABOLIC PANEL (CC13)
ALBUMIN: 3.4 g/dL — AB (ref 3.5–5.0)
ALK PHOS: 263 U/L — AB (ref 40–150)
ALT: 36 U/L (ref 0–55)
AST: 29 U/L (ref 5–34)
Anion Gap: 7 mEq/L (ref 3–11)
BILIRUBIN TOTAL: 0.32 mg/dL (ref 0.20–1.20)
BUN: 8 mg/dL (ref 7.0–26.0)
CALCIUM: 8.8 mg/dL (ref 8.4–10.4)
CHLORIDE: 106 meq/L (ref 98–109)
CO2: 26 mEq/L (ref 22–29)
CREATININE: 0.8 mg/dL (ref 0.6–1.1)
EGFR: 74 mL/min/{1.73_m2} — ABNORMAL LOW (ref >90)
Glucose: 123 mg/dl (ref 70–140)
Potassium: 3.7 mEq/L (ref 3.5–5.1)
Sodium: 140 mEq/L (ref 136–145)
TOTAL PROTEIN: 6.3 g/dL — AB (ref 6.4–8.3)

## 2015-01-10 LAB — CBC WITH DIFFERENTIAL/PLATELET
BASO%: 0.4 % (ref 0.0–2.0)
Basophils Absolute: 0.1 10*3/uL (ref 0.0–0.1)
EOS%: 0.8 % (ref 0.0–7.0)
Eosinophils Absolute: 0.1 10*3/uL (ref 0.0–0.5)
HEMATOCRIT: 29.5 % — AB (ref 34.8–46.6)
HEMOGLOBIN: 10 g/dL — AB (ref 11.6–15.9)
LYMPH#: 1.4 10*3/uL (ref 0.9–3.3)
LYMPH%: 11.6 % — ABNORMAL LOW (ref 14.0–49.7)
MCH: 30.6 pg (ref 25.1–34.0)
MCHC: 33.9 g/dL (ref 31.5–36.0)
MCV: 90.2 fL (ref 79.5–101.0)
MONO#: 1.8 10*3/uL — AB (ref 0.1–0.9)
MONO%: 14.6 % — ABNORMAL HIGH (ref 0.0–14.0)
NEUT%: 72.6 % (ref 38.4–76.8)
NEUTROS ABS: 8.7 10*3/uL — AB (ref 1.5–6.5)
NRBC: 1 % — AB (ref 0–0)
Platelets: 79 10*3/uL — ABNORMAL LOW (ref 145–400)
RBC: 3.27 10*6/uL — ABNORMAL LOW (ref 3.70–5.45)
RDW: 14.4 % (ref 11.2–14.5)
WBC: 12.1 10*3/uL — ABNORMAL HIGH (ref 3.9–10.3)

## 2015-01-10 LAB — URINALYSIS, MICROSCOPIC - CHCC
BILIRUBIN (URINE): NEGATIVE
Blood: NEGATIVE
Glucose: NEGATIVE mg/dL
Ketones: NEGATIVE mg/dL
LEUKOCYTE ESTERASE: NEGATIVE
NITRITE: NEGATIVE
Protein: NEGATIVE mg/dL
RBC / HPF: NEGATIVE (ref 0–2)
Specific Gravity, Urine: 1.005 (ref 1.003–1.035)
Urobilinogen, UR: 0.2 mg/dL (ref 0.2–1)
pH: 6.5 (ref 4.6–8.0)

## 2015-01-10 LAB — HOLD TUBE, BLOOD BANK

## 2015-01-10 MED ORDER — HEPARIN SOD (PORK) LOCK FLUSH 100 UNIT/ML IV SOLN
500.0000 [IU] | Freq: Once | INTRAVENOUS | Status: AC
  Administered 2015-01-10: 500 [IU] via INTRAVENOUS
  Filled 2015-01-10: qty 5

## 2015-01-10 MED ORDER — SODIUM CHLORIDE 0.9 % IJ SOLN
10.0000 mL | INTRAMUSCULAR | Status: DC | PRN
  Administered 2015-01-10: 10 mL via INTRAVENOUS
  Filled 2015-01-10: qty 10

## 2015-01-10 NOTE — Patient Instructions (Signed)

## 2015-01-11 DIAGNOSIS — I428 Other cardiomyopathies: Secondary | ICD-10-CM

## 2015-01-11 DIAGNOSIS — I5022 Chronic systolic (congestive) heart failure: Secondary | ICD-10-CM

## 2015-01-11 HISTORY — DX: Chronic systolic (congestive) heart failure: I50.22

## 2015-01-11 HISTORY — DX: Other cardiomyopathies: I42.8

## 2015-01-11 LAB — URINE CULTURE

## 2015-01-12 ENCOUNTER — Other Ambulatory Visit: Payer: Self-pay | Admitting: Hematology and Oncology

## 2015-01-12 ENCOUNTER — Telehealth: Payer: Self-pay

## 2015-01-12 ENCOUNTER — Encounter: Payer: Self-pay | Admitting: Hematology and Oncology

## 2015-01-12 ENCOUNTER — Ambulatory Visit (HOSPITAL_BASED_OUTPATIENT_CLINIC_OR_DEPARTMENT_OTHER): Payer: Medicare Other | Admitting: Hematology and Oncology

## 2015-01-12 ENCOUNTER — Telehealth: Payer: Self-pay | Admitting: *Deleted

## 2015-01-12 ENCOUNTER — Other Ambulatory Visit (HOSPITAL_BASED_OUTPATIENT_CLINIC_OR_DEPARTMENT_OTHER): Payer: Medicare Other

## 2015-01-12 ENCOUNTER — Telehealth: Payer: Self-pay | Admitting: Hematology and Oncology

## 2015-01-12 VITALS — BP 153/61 | HR 95 | Temp 99.2°F | Resp 18 | Ht 66.0 in | Wt 236.7 lb

## 2015-01-12 DIAGNOSIS — C8228 Follicular lymphoma grade III, unspecified, lymph nodes of multiple sites: Secondary | ICD-10-CM

## 2015-01-12 DIAGNOSIS — Z85038 Personal history of other malignant neoplasm of large intestine: Secondary | ICD-10-CM | POA: Diagnosis not present

## 2015-01-12 DIAGNOSIS — B001 Herpesviral vesicular dermatitis: Secondary | ICD-10-CM

## 2015-01-12 DIAGNOSIS — C8248 Follicular lymphoma grade IIIb, lymph nodes of multiple sites: Secondary | ICD-10-CM | POA: Diagnosis not present

## 2015-01-12 DIAGNOSIS — Z8579 Personal history of other malignant neoplasms of lymphoid, hematopoietic and related tissues: Secondary | ICD-10-CM | POA: Diagnosis not present

## 2015-01-12 DIAGNOSIS — J0100 Acute maxillary sinusitis, unspecified: Secondary | ICD-10-CM

## 2015-01-12 LAB — COMPREHENSIVE METABOLIC PANEL
ALBUMIN: 3.5 g/dL (ref 3.5–5.0)
ALK PHOS: 262 U/L — AB (ref 40–150)
ALT: 26 U/L (ref 0–55)
ANION GAP: 8 meq/L (ref 3–11)
AST: 25 U/L (ref 5–34)
BUN: 8.7 mg/dL (ref 7.0–26.0)
CO2: 27 mEq/L (ref 22–29)
Calcium: 9.3 mg/dL (ref 8.4–10.4)
Chloride: 105 mEq/L (ref 98–109)
Creatinine: 0.8 mg/dL (ref 0.6–1.1)
EGFR: 74 mL/min/{1.73_m2} — AB (ref >90)
Glucose: 127 mg/dl (ref 70–140)
Potassium: 4.3 mEq/L (ref 3.5–5.1)
Sodium: 139 mEq/L (ref 136–145)
TOTAL PROTEIN: 6.3 g/dL — AB (ref 6.4–8.3)

## 2015-01-12 LAB — CBC WITH DIFFERENTIAL/PLATELET
BASO%: 0.3 % (ref 0.0–2.0)
Basophils Absolute: 0 10*3/uL (ref 0.0–0.1)
EOS ABS: 0.1 10*3/uL (ref 0.0–0.5)
EOS%: 0.5 % (ref 0.0–7.0)
HCT: 30.3 % — ABNORMAL LOW (ref 34.8–46.6)
HEMOGLOBIN: 10.3 g/dL — AB (ref 11.6–15.9)
LYMPH#: 1.6 10*3/uL (ref 0.9–3.3)
LYMPH%: 15.8 % (ref 14.0–49.7)
MCH: 30.9 pg (ref 25.1–34.0)
MCHC: 34 g/dL (ref 31.5–36.0)
MCV: 91 fL (ref 79.5–101.0)
MONO#: 1.6 10*3/uL — AB (ref 0.1–0.9)
MONO%: 16.3 % — ABNORMAL HIGH (ref 0.0–14.0)
NEUT%: 67.1 % (ref 38.4–76.8)
NEUTROS ABS: 6.6 10*3/uL — AB (ref 1.5–6.5)
NRBC: 5 % — AB (ref 0–0)
PLATELETS: 183 10*3/uL (ref 145–400)
RBC: 3.33 10*6/uL — ABNORMAL LOW (ref 3.70–5.45)
RDW: 14.6 % — AB (ref 11.2–14.5)
WBC: 9.8 10*3/uL (ref 3.9–10.3)

## 2015-01-12 LAB — HOLD TUBE, BLOOD BANK

## 2015-01-12 MED ORDER — ACYCLOVIR 400 MG PO TABS: 400.0000 mg | ORAL_TABLET | Freq: Every day | ORAL | Status: DC

## 2015-01-12 NOTE — Assessment & Plan Note (Signed)
Overall, she is improving on oral antibiotics. Leukocytosis has resolved. I recommend she continue the same.

## 2015-01-12 NOTE — Telephone Encounter (Signed)
Pt called to update [place]. She still running low grade fever 100.5 at 10 pm. 99.1 this AM. One more tablet on z-pack. Still having sinus congestion. She has lab/flush/MD on 12/7. She does not know if Dr Alvy Bimler will want her to go into the hospital for chemo with this fever and congestion. Asking for return call.

## 2015-01-12 NOTE — Telephone Encounter (Signed)
perp of to sch pt appt-pt aware °

## 2015-01-12 NOTE — Telephone Encounter (Signed)
Pt coming in at 1300 for lab/MD today

## 2015-01-12 NOTE — Assessment & Plan Note (Signed)
She developed recurrent cold sore, likely due to immunocompromised state from recent treatment. I recommend acyclovir for 5 days and then in the future plan to start her on acyclovir to prevent recurrent cold sore

## 2015-01-12 NOTE — Assessment & Plan Note (Signed)
Her posttreatment course is complicated by development of cold sore and acute sinusitis. Continue supportive care.

## 2015-01-12 NOTE — Progress Notes (Signed)
Diamond Boyd OFFICE PROGRESS NOTE  Patient Care Team: Abner Greenspan, MD as PCP - General Mosetta Anis, MD (Allergy) Fanny Skates, MD as Consulting Physician (General Surgery) Grace Isaac, MD as Consulting Physician (Cardiothoracic Surgery)  SUMMARY OF ONCOLOGIC HISTORY: Oncology History   History of colon cancer   Staging form: Colon and Rectum, AJCC 7th Edition     Clinical: Stage IIA (T3, N0, M0) - Signed by [name], MD on 02/21/2014 History of hodgkin's lymphoma   Staging form: Lymphoid Neoplasms, AJCC 6th Edition     Clinical: Stage IV - Signed by [name], MD on 02/21/2014       History of hodgkin's lymphoma   05/20/2013 Initial Diagnosis Hodgkin lymphoma   06/27/2014 Imaging Interval increase in metabolic activity of several small lymph nodes is concerning for lymphoma recurrence. Lymph nodes include a small right level 2 lymph node, right hilar lymph node, and right paratracheal lymph node   07/07/2014 Pathology Results  limited tissue from ultrasound-guided biopsy was nondiagnostic   07/07/2014 Procedure  ultrasound-guided biopsy was nondiagnostic   09/27/2014 Imaging Repeat PET CT scan show diffuse disease concern for relapse.   10/17/2014 Pathology Results Accession: [id number] Biopsy was nondiagnostic   10/17/2014 Procedure She underwent bronchoscopy & EBUS & biopsy of  LN at Level 10L   11/01/2014 Surgery She underwent cronchoscopy with endobronchial ultrasound, mediastinoscopy and biopsy    11/01/2014 Pathology Results Accession: [id number] biopsy showed no evidence of lymphoma, only granulomas.   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 She is seen urgently today because of understood resolving infection. She was seen by her primary care doctor recently for acute bacterial sinusitis and was prescribed Z-Pak. Sinusitis is improving. The lymph node adjacent to the left parotid region has resolved. She however developed new cold sore affecting the left nasolabial fold region. She denies fevers or chills. REVIEW OF SYSTEMS:   Constitutional: Denies fevers, chills or abnormal weight loss Eyes: Denies blurriness of vision Ears, nose, mouth, throat, and face: Denies mucositis or sore throat Respiratory: Denies cough, dyspnea or wheezes Cardiovascular: Denies palpitation, chest discomfort or lower extremity swelling Gastrointestinal:  Denies nausea, heartburn or change in bowel habits Skin: Denies abnormal skin rashes Lymphatics: Denies new lymphadenopathy or easy bruising Neurological:Denies numbness, tingling or new weaknesses Behavioral/Psych: Mood is stable, no new changes  All other systems were reviewed with the patient and are negative.  I have reviewed the past medical history, past surgical history, social history and family history with the patient and they are unchanged from previous note.  ALLERGIES:  is allergic to achromycin; allopurinol; astelin; cephalexin; codeine; meloxicam; minocycline; nabumetone; nyquil; penicillins; sulfa antibiotics; zolpidem tartrate; buspar; and ciprofloxacin.  MEDICATIONS:  Current Outpatient Prescriptions  Medication Sig Dispense Refill  . acetaminophen (TYLENOL) 650 MG CR tablet Take 650 mg by mouth every 8 (eight) hours as needed (For back  pain.).    Marland Kitchen  acyclovir (ZOVIRAX) 400 MG tablet Take 1 tablet (400 mg total) by mouth 5 (five) times daily. 25 tablet 0  . azithromycin (ZITHROMAX) 250 MG tablet 2 tab po x 1 day then 1 tab po daily 6 tablet 0  . Calcium Carbonate-Vitamin D (CALCIUM 600+D) 600-400 MG-UNIT per tablet Take 1 tablet by mouth daily.     . celecoxib (CELEBREX) 200 MG capsule Take 1 capsule (200 mg total) by mouth daily as needed for moderate pain (with food). (Patient taking differently: Take 200 mg by mouth daily. ) 90 capsule 1  . Cholecalciferol (VITAMIN D-3) 1000 UNITS CAPS Take 1,000 Units by mouth daily.    Marland Kitchen docusate sodium (COLACE) 100 MG capsule Take 100 mg by mouth at bedtime.    Marland Kitchen estradiol (ESTRACE) 2 MG tablet Take 0.5 tablets (1 mg total) by mouth daily. Takes 1/2 tablet (Patient taking differently: Take 1 mg by mouth daily. ) 45 tablet 3  . fexofenadine (ALLEGRA) 180 MG tablet TAKE 1 TABLET (180 MG TOTAL) BY MOUTH DAILY. 90 tablet 0  . FLUoxetine (PROZAC) 20 MG capsule Take 20 mg by mouth daily.    . fluticasone (FLONASE) 50 MCG/ACT nasal spray Place 1 spray into both  nostrils 2 (two) times  daily. 48 g 0  . gabapentin (NEURONTIN) 300 MG capsule Take 1 capsule (300 mg total) by mouth 2 (two) times daily. 90 capsule 3  . HYDROcodone-acetaminophen (NORCO/VICODIN) 5-325 MG tablet Take 1-2 tablets by mouth every 4 (four) hours as needed (For pain.).    Marland Kitchen Hydrocodone-Chlorpheniramine 5-4 MG/5ML SOLN Take 5 mLs by mouth 2 (two) times daily as needed. For cough 120 mL 0  . levothyroxine (SYNTHROID, LEVOTHROID) 112 MCG tablet Take 1 tablet by mouth  daily (Patient taking differently: Take 112 mcg by mouth daily before breakfast. Take 1 tablet by mouth  daily) 90 tablet 3  . lidocaine-prilocaine (EMLA) cream Apply 1 application topically as needed (For port-a-cath.).    Marland Kitchen Omega-3 350 MG CAPS Take 1 capsule by mouth daily.    Marland Kitchen omeprazole (PRILOSEC) 40 MG capsule Take 1 capsule by mouth two times daily (Patient taking  differently: Take 1 capsule by mouth daily) 180 capsule 0  . ondansetron (ZOFRAN) 8 MG tablet Take 8 mg by mouth every 6 (six) hours as needed for nausea or vomiting.     . polyethylene glycol (MIRALAX / GLYCOLAX) packet Take 17 g by mouth at bedtime.    . prochlorperazine (COMPAZINE) 10 MG tablet Take 10 mg by mouth every 6 (six) hours as needed.    . psyllium (METAMUCIL) 58.6 % packet Take 1-3 packets by mouth at bedtime.     . vitamin E (VITAMIN E) 400 UNIT capsule Take 400 Units by mouth daily.      No current facility-administered medications for this visit.    PHYSICAL EXAMINATION: ECOG PERFORMANCE STATUS: 0 - Asymptomatic  Filed Vitals:   01/12/15 1352  BP: 153/61  Pulse: 95  Temp: 99.2 F (37.3 C)  Resp: 18   Filed Weights   01/12/15 1352  Weight: 236 lb 11.2 oz (107.366 kg)    GENERAL:alert, no distress and comfortable SKIN: Noted cold sore on her face EYES: normal, Conjunctiva are pink and non-injected, sclera clear OROPHARYNX:no exudate, no erythema and lips, buccal mucosa, and tongue normal  NECK: supple, thyroid normal size, non-tender, without nodularity LYMPH:  no palpable lymphadenopathy in the cervical, axillary or inguinal. Prior palpable lymphadenopathy has resolved LUNGS:  clear to auscultation and percussion with normal breathing effort HEART: regular rate & rhythm and no murmurs and no lower extremity edema ABDOMEN:abdomen soft, non-tender and normal bowel sounds Musculoskeletal:no cyanosis of digits and no clubbing  NEURO: alert & oriented x 3 with fluent speech, no focal motor/sensory deficits  LABORATORY DATA:  I have reviewed the data as listed    Component Value Date/Time   NA 139 01/12/2015 1336   NA 139 12/30/2014 0413   K 4.3 01/12/2015 1336   K 4.1 12/30/2014 0413   CL 107 12/30/2014 0413   CL 105 07/14/2012 0939   CO2 27 01/12/2015 1336   CO2 24 12/30/2014 0413   GLUCOSE 127 01/12/2015 1336   GLUCOSE 126* 12/30/2014 0413   GLUCOSE  103* 07/14/2012 0939   BUN 8.7 01/12/2015 1336   BUN 16 12/30/2014 0413   CREATININE 0.8 01/12/2015 1336   CREATININE 0.61 12/30/2014 0413   CALCIUM 9.3 01/12/2015 1336   CALCIUM 9.0 12/30/2014 0413   CALCIUM 9.3 08/09/2009 0000   PROT 6.3* 01/12/2015 1336   PROT 6.4* 12/30/2014 0413   ALBUMIN 3.5 01/12/2015 1336   ALBUMIN 3.9 12/30/2014 0413   AST 25 01/12/2015 1336   AST 108* 12/30/2014 0413   ALT 26 01/12/2015 1336   ALT 108* 12/30/2014 0413   ALKPHOS 262* 01/12/2015 1336   ALKPHOS 135* 12/30/2014 0413   BILITOT <0.30 01/12/2015 1336   BILITOT 0.9 12/30/2014 0413   GFRNONAA >60 12/30/2014 0413   GFRAA >60 12/30/2014 0413    No results found for: SPEP, UPEP  Lab Results  Component Value Date   WBC 9.8 01/12/2015   NEUTROABS 6.6* 01/12/2015   HGB 10.3* 01/12/2015   HCT 30.3* 01/12/2015   MCV 91.0 01/12/2015   PLT 183 01/12/2015      Chemistry      Component Value Date/Time   NA 139 01/12/2015 1336   NA 139 12/30/2014 0413   K 4.3 01/12/2015 1336   K 4.1 12/30/2014 0413   CL 107 12/30/2014 0413   CL 105 07/14/2012 0939   CO2 27 01/12/2015 1336   CO2 24 12/30/2014 0413   BUN 8.7 01/12/2015 1336   BUN 16 12/30/2014 0413   CREATININE 0.8 01/12/2015 1336   CREATININE 0.61 12/30/2014 0413      Component Value Date/Time   CALCIUM 9.3 01/12/2015 1336   CALCIUM 9.0 12/30/2014 0413   CALCIUM 9.3 08/09/2009 0000   ALKPHOS 262* 01/12/2015 1336   ALKPHOS 135* 12/30/2014 0413   AST 25 01/12/2015 1336   AST 108* 12/30/2014 0413   ALT 26 01/12/2015 1336   ALT 108* 12/30/2014 0413   BILITOT <0.30 01/12/2015 1336   BILITOT 0.9 12/30/2014 0413       ASSESSMENT & PLAN:  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 Continue supportive care.  Cold sore She developed recurrent cold sore, likely due to immunocompromised state from recent treatment. I  recommend acyclovir for 5 days and then in the future plan to start her on acyclovir to prevent recurrent cold sore  Acute maxillary sinusitis Overall, she is improving on oral antibiotics. Leukocytosis has resolved. I recommend she continue the same.   No orders of the defined types were placed in this encounter.   All questions were answered. The patient knows to call the clinic with any problems, questions or concerns. No barriers to learning was detected.  I spent 15 minutes counseling the patient face to face. The total time spent in the appointment was 20 minutes and more than 50% was on counseling and review of test results     [facility], [place], MD 01/12/2015 2:23 PM

## 2015-01-16 ENCOUNTER — Telehealth: Payer: Self-pay | Admitting: *Deleted

## 2015-01-16 LAB — CULTURE, BLOOD (SINGLE)

## 2015-01-16 NOTE — Telephone Encounter (Signed)
Pt states no fever today.  Her cold sore is "drying up" and she feels like she will be ok for chemo this week.  She says "I might as well get it over with."  She plans to keep her appt w/ Dr. Alvy Bimler tomorrow morning w/ chemo admission planned for Thursday.   Informed pt Dr. Alvy Bimler may be running a little late tomorrow morning due to a meeting, so she can come a little later for her appointment.  Pt understands, but she still plans to be here as scheduled for lab appointment and doesn't mind waiting for Dr. Alvy Bimler if she is running behind tomorrow.

## 2015-01-17 ENCOUNTER — Telehealth: Payer: Self-pay | Admitting: Hematology and Oncology

## 2015-01-17 ENCOUNTER — Ambulatory Visit (HOSPITAL_BASED_OUTPATIENT_CLINIC_OR_DEPARTMENT_OTHER): Payer: Medicare Other | Admitting: Hematology and Oncology

## 2015-01-17 ENCOUNTER — Ambulatory Visit: Payer: Medicare Other

## 2015-01-17 ENCOUNTER — Encounter: Payer: Self-pay | Admitting: Hematology and Oncology

## 2015-01-17 ENCOUNTER — Other Ambulatory Visit (HOSPITAL_BASED_OUTPATIENT_CLINIC_OR_DEPARTMENT_OTHER): Payer: Medicare Other

## 2015-01-17 VITALS — BP 158/59 | HR 84 | Temp 98.6°F | Resp 18 | Ht 66.0 in | Wt 235.7 lb

## 2015-01-17 DIAGNOSIS — D638 Anemia in other chronic diseases classified elsewhere: Secondary | ICD-10-CM | POA: Diagnosis not present

## 2015-01-17 DIAGNOSIS — G620 Drug-induced polyneuropathy: Secondary | ICD-10-CM

## 2015-01-17 DIAGNOSIS — B001 Herpesviral vesicular dermatitis: Secondary | ICD-10-CM | POA: Diagnosis not present

## 2015-01-17 DIAGNOSIS — C8228 Follicular lymphoma grade III, unspecified, lymph nodes of multiple sites: Secondary | ICD-10-CM

## 2015-01-17 DIAGNOSIS — T451X5A Adverse effect of antineoplastic and immunosuppressive drugs, initial encounter: Secondary | ICD-10-CM

## 2015-01-17 DIAGNOSIS — C8248 Follicular lymphoma grade IIIb, lymph nodes of multiple sites: Secondary | ICD-10-CM

## 2015-01-17 LAB — COMPREHENSIVE METABOLIC PANEL
ALT: 18 U/L (ref 0–55)
ANION GAP: 10 meq/L (ref 3–11)
AST: 22 U/L (ref 5–34)
Albumin: 3.5 g/dL (ref 3.5–5.0)
Alkaline Phosphatase: 217 U/L — ABNORMAL HIGH (ref 40–150)
BUN: 8.4 mg/dL (ref 7.0–26.0)
CO2: 24 meq/L (ref 22–29)
CREATININE: 0.8 mg/dL (ref 0.6–1.1)
Calcium: 9.4 mg/dL (ref 8.4–10.4)
Chloride: 106 mEq/L (ref 98–109)
EGFR: 74 mL/min/{1.73_m2} — ABNORMAL LOW (ref >90)
GLUCOSE: 135 mg/dL (ref 70–140)
Potassium: 3.9 mEq/L (ref 3.5–5.1)
SODIUM: 140 meq/L (ref 136–145)
TOTAL PROTEIN: 6.3 g/dL — AB (ref 6.4–8.3)

## 2015-01-17 LAB — CBC WITH DIFFERENTIAL/PLATELET
BASO%: 0.3 % (ref 0.0–2.0)
Basophils Absolute: 0 10*3/uL (ref 0.0–0.1)
EOS ABS: 0 10*3/uL (ref 0.0–0.5)
EOS%: 0.3 % (ref 0.0–7.0)
HCT: 32 % — ABNORMAL LOW (ref 34.8–46.6)
HGB: 10.7 g/dL — ABNORMAL LOW (ref 11.6–15.9)
LYMPH#: 1.4 10*3/uL (ref 0.9–3.3)
LYMPH%: 19.1 % (ref 14.0–49.7)
MCH: 30.7 pg (ref 25.1–34.0)
MCHC: 33.4 g/dL (ref 31.5–36.0)
MCV: 91.7 fL (ref 79.5–101.0)
MONO#: 1.5 10*3/uL — ABNORMAL HIGH (ref 0.1–0.9)
MONO%: 19.6 % — AB (ref 0.0–14.0)
NEUT%: 60.7 % (ref 38.4–76.8)
NEUTROS ABS: 4.6 10*3/uL (ref 1.5–6.5)
NRBC: 2 % — AB (ref 0–0)
PLATELETS: 576 10*3/uL — AB (ref 145–400)
RBC: 3.49 10*6/uL — AB (ref 3.70–5.45)
RDW: 15.2 % — AB (ref 11.2–14.5)
WBC: 7.5 10*3/uL (ref 3.9–10.3)

## 2015-01-17 LAB — HOLD TUBE, BLOOD BANK

## 2015-01-17 MED ORDER — ACYCLOVIR 400 MG PO TABS: 400.0000 mg | ORAL_TABLET | Freq: Every day | ORAL | Status: DC

## 2015-01-17 NOTE — Assessment & Plan Note (Signed)
This has almost completely resolved. She has completed high-dose treatment. I will start her on prophylactic dose with 400 mg daily acyclovir starting tomorrow

## 2015-01-17 NOTE — Progress Notes (Signed)
[facility] OFFICE PROGRESS NOTE  Patient Care Team: Abner Greenspan, MD as PCP - General Mosetta Anis, MD (Allergy) Fanny Skates, MD as Consulting Physician (General Surgery) Grace Isaac, MD as Consulting Physician (Cardiothoracic Surgery)  SUMMARY OF ONCOLOGIC HISTORY: Oncology History   History of colon cancer   Staging form: Colon and Rectum, AJCC 7th Edition     Clinical: Stage IIA (T3, N0, M0) - Signed by [name], MD on 02/21/2014 History of hodgkin's lymphoma   Staging form: Lymphoid Neoplasms, AJCC 6th Edition     Clinical: Stage IV - Signed by [name], MD on 02/21/2014       History of hodgkin's lymphoma   05/20/2013 Initial Diagnosis Hodgkin lymphoma   06/27/2014 Imaging Interval increase in metabolic activity of several small lymph nodes is concerning for lymphoma recurrence. Lymph nodes include a small right level 2 lymph node, right hilar lymph node, and right paratracheal lymph node   07/07/2014 Pathology Results  limited tissue from ultrasound-guided biopsy was nondiagnostic   07/07/2014 Procedure  ultrasound-guided biopsy was nondiagnostic   09/27/2014 Imaging Repeat PET CT scan show diffuse disease concern for relapse.   10/17/2014 Pathology Results Accession: [id number] Biopsy was nondiagnostic   10/17/2014 Procedure She underwent bronchoscopy & EBUS & biopsy of  LN at Level 10L   11/01/2014 Surgery She underwent cronchoscopy with endobronchial ultrasound, mediastinoscopy and biopsy    11/01/2014 Pathology Results Accession: [id number] biopsy showed no evidence of lymphoma, only granulomas.   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 She is seen today prior to cycle 2 of treatment. She has no new lymphadenopathy. The abnormal lymphadenopathy anterior to the left parotid gland has resolved. The cold sore has almost completely resolved. Sinusitis has resolved. She denies worsening neuropathy. She denies severe nausea or vomiting with last cycle of treatment.  REVIEW OF SYSTEMS:   Constitutional: Denies fevers, chills or abnormal weight loss Eyes: Denies blurriness of vision Ears, nose, mouth, throat, and face: Denies mucositis or sore throat Respiratory: Denies cough, dyspnea or wheezes Cardiovascular: Denies palpitation, chest discomfort or lower extremity swelling Gastrointestinal:  Denies nausea, heartburn or change in bowel habits Skin: Denies abnormal skin rashes Lymphatics: Denies new lymphadenopathy or easy bruising Neurological:Denies numbness, tingling or new weaknesses Behavioral/Psych: Mood is stable, no new changes  All other systems were reviewed with the patient and are negative.  I have reviewed the past medical history, past surgical history, social history and family history with the patient and they are unchanged from previous note.  ALLERGIES:  is allergic to achromycin; allopurinol; astelin; cephalexin; codeine; meloxicam; minocycline; nabumetone; nyquil; penicillins; sulfa antibiotics; zolpidem tartrate; buspar; and ciprofloxacin.  MEDICATIONS:  Current Outpatient Prescriptions  Medication Sig Dispense Refill  . acetaminophen (TYLENOL) 650 MG CR tablet Take 650 mg by mouth every 8 (eight) hours as needed (For back pain.).    Marland Kitchen Calcium  Carbonate-Vitamin D (CALCIUM 600+D)  600-400 MG-UNIT per tablet Take 1 tablet by mouth daily.     . celecoxib (CELEBREX) 200 MG capsule Take 1 capsule (200 mg total) by mouth daily as needed for moderate pain (with food). (Patient taking differently: Take 200 mg by mouth daily. ) 90 capsule 1  . Cholecalciferol (VITAMIN D-3) 1000 UNITS CAPS Take 1,000 Units by mouth daily.    Marland Kitchen docusate sodium (COLACE) 100 MG capsule Take 100 mg by mouth at bedtime.    Marland Kitchen estradiol (ESTRACE) 2 MG tablet Take 0.5 tablets (1 mg total) by mouth daily. Takes 1/2 tablet (Patient taking differently: Take 1 mg by mouth daily. ) 45 tablet 3  . fexofenadine (ALLEGRA) 180 MG tablet TAKE 1 TABLET (180 MG TOTAL) BY MOUTH DAILY. 90 tablet 0  . FLUoxetine (PROZAC) 20 MG capsule Take 20 mg by mouth daily.    . fluticasone (FLONASE) 50 MCG/ACT nasal spray Place 1 spray into both  nostrils 2 (two) times  daily. 48 g 0  . gabapentin (NEURONTIN) 300 MG capsule Take 1 capsule (300 mg total) by mouth 2 (two) times daily. 90 capsule 3  . HYDROcodone-acetaminophen (NORCO/VICODIN) 5-325 MG tablet Take 1-2 tablets by mouth every 4 (four) hours as needed (For pain.).    Marland Kitchen levothyroxine (SYNTHROID, LEVOTHROID) 112 MCG tablet Take 1 tablet by mouth  daily (Patient taking differently: Take 112 mcg by mouth daily before breakfast. Take 1 tablet by mouth  daily) 90 tablet 3  . lidocaine-prilocaine (EMLA) cream Apply 1 application topically as needed (For port-a-cath.).    Marland Kitchen Omega-3 350 MG CAPS Take 1 capsule by mouth daily.    Marland Kitchen omeprazole (PRILOSEC) 40 MG capsule Take 1 capsule by mouth two times daily (Patient taking differently: Take 1 capsule by mouth daily) 180 capsule 0  . ondansetron (ZOFRAN) 8 MG tablet Take 8 mg by mouth every 6 (six) hours as needed for nausea or vomiting.     . polyethylene glycol (MIRALAX / GLYCOLAX) packet Take 17 g by mouth at bedtime.    . prochlorperazine (COMPAZINE) 10 MG tablet Take 10 mg by mouth every 6 (six) hours as needed.    . psyllium  (METAMUCIL) 58.6 % packet Take 1-3 packets by mouth at bedtime.     . vitamin E (VITAMIN E) 400 UNIT capsule Take 400 Units by mouth daily.     Marland Kitchen acyclovir (ZOVIRAX) 400 MG tablet Take 1 tablet (400 mg total) by mouth daily. 90 tablet 6   No current facility-administered medications for this visit.    PHYSICAL EXAMINATION: ECOG PERFORMANCE STATUS: 0 - Asymptomatic  Filed Vitals:   01/17/15 0850  BP: 158/59  Pulse: 84  Temp: 98.6 F (37 C)  Resp: 18   Filed Weights   01/17/15 0850  Weight: 235 lb 11.2 oz (106.913 kg)    GENERAL:alert, no distress and comfortable SKIN: She has mild skin crusting near her left nose/upper lip, almost completely healed compared to last visit EYES: normal, Conjunctiva are pink and non-injected, sclera clear OROPHARYNX:no exudate, no erythema and lips, buccal mucosa, and tongue normal  NECK: supple, thyroid normal size, non-tender, without nodularity LYMPH:  no palpable lymphadenopathy in the cervical, axillary or inguinal LUNGS: clear to auscultation and percussion with normal breathing effort HEART: regular rate & rhythm and no murmurs and no lower extremity edema ABDOMEN:abdomen soft, non-tender and normal bowel sounds Musculoskeletal:no cyanosis of digits and no clubbing  NEURO: alert & oriented x 3 with fluent speech,  no focal motor/sensory deficits  LABORATORY DATA:  I have reviewed the data as listed    Component Value Date/Time   NA 140 01/17/2015 0806   NA 139 12/30/2014 0413   K 3.9 01/17/2015 0806   K 4.1 12/30/2014 0413   CL 107 12/30/2014 0413   CL 105 07/14/2012 0939   CO2 24 01/17/2015 0806   CO2 24 12/30/2014 0413   GLUCOSE 135 01/17/2015 0806   GLUCOSE 126* 12/30/2014 0413   GLUCOSE 103* 07/14/2012 0939   BUN 8.4 01/17/2015 0806   BUN 16 12/30/2014 0413   CREATININE 0.8 01/17/2015 0806   CREATININE 0.61 12/30/2014 0413   CALCIUM 9.4 01/17/2015 0806   CALCIUM 9.0 12/30/2014 0413   CALCIUM 9.3 08/09/2009 0000   PROT  6.3* 01/17/2015 0806   PROT 6.4* 12/30/2014 0413   ALBUMIN 3.5 01/17/2015 0806   ALBUMIN 3.9 12/30/2014 0413   AST 22 01/17/2015 0806   AST 108* 12/30/2014 0413   ALT 18 01/17/2015 0806   ALT 108* 12/30/2014 0413   ALKPHOS 217* 01/17/2015 0806   ALKPHOS 135* 12/30/2014 0413   BILITOT <0.30 01/17/2015 0806   BILITOT 0.9 12/30/2014 0413   GFRNONAA >60 12/30/2014 0413   GFRAA >60 12/30/2014 0413    No results found for: SPEP, UPEP  Lab Results  Component Value Date   WBC 7.5 01/17/2015   NEUTROABS 4.6 01/17/2015   HGB 10.7* 01/17/2015   HCT 32.0* 01/17/2015   MCV 91.7 01/17/2015   PLT 576* 01/17/2015      Chemistry      Component Value Date/Time   NA 140 01/17/2015 0806   NA 139 12/30/2014 0413   K 3.9 01/17/2015 0806   K 4.1 12/30/2014 0413   CL 107 12/30/2014 0413   CL 105 07/14/2012 0939   CO2 24 01/17/2015 0806   CO2 24 12/30/2014 0413   BUN 8.4 01/17/2015 0806   BUN 16 12/30/2014 0413   CREATININE 0.8 01/17/2015 0806   CREATININE 0.61 12/30/2014 0413      Component Value Date/Time   CALCIUM 9.4 01/17/2015 0806   CALCIUM 9.0 12/30/2014 0413   CALCIUM 9.3 08/09/2009 0000   ALKPHOS 217* 01/17/2015 0806   ALKPHOS 135* 12/30/2014 0413   AST 22 01/17/2015 0806   AST 108* 12/30/2014 0413   ALT 18 01/17/2015 0806   ALT 108* 12/30/2014 0413   BILITOT <0.30 01/17/2015 0806   BILITOT 0.9 12/30/2014 0413      ASSESSMENT & PLAN:  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 She recommended switching her treatment to RICE regimen for 2 cycles before repeat PET/CT scan. Some of the expected risks, benefits, and side effects including risk of pancytopenia, hair loss, nausea, vomiting & risks of infection are discussed with the patient. She tolerated cycle 1 well apart from development of sinusitis, treated with antibiotics and cold sores, resolved with acyclovir Due to her  baseline peripheral neuropathy, I will reduce the dose of carboplatin a little bit. She will be admitted tomorrow on 01/18/2015 for cycle 2. She will get a PET CT scan scheduled on 02/02/2015 to assess response to treatment.  Anemia in chronic illness This is likely anemia of chronic disease. The patient denies recent history of bleeding such as epistaxis, hematuria or hematochezia. She is asymptomatic from the anemia. We will observe for now.   Cold sore This has almost completely resolved. She has  completed high-dose treatment. I will start her on prophylactic dose with 400 mg daily acyclovir starting tomorrow  Peripheral neuropathy due to chemotherapy ([place]) Due to neuropathy from prior chemotherapy, I will continue reduceed dose of carboplatin as before.    Orders Placed This Encounter  Procedures  . NM PET Image Restag (PS) Skull Base To Thigh    Standing Status: Future     Number of Occurrences:      Standing Expiration Date: 03/18/2016    Order Specific Question:  Reason for Exam (SYMPTOM  OR DIAGNOSIS REQUIRED)    Answer:  staging lymphoma, assess response to Rx    Order Specific Question:  Preferred imaging location?    Answer:  [facility]   All questions were answered. The patient knows to call the clinic with any problems, questions or concerns. No barriers to learning was detected. I spent 25 minutes counseling the patient face to face. The total time spent in the appointment was 40 minutes and more than 50% was on counseling and review of test results     [facility], [place], MD 01/17/2015 9:13 AM

## 2015-01-17 NOTE — Telephone Encounter (Signed)
Gave and printed appt sched and avs for pt for DEc °

## 2015-01-17 NOTE — Assessment & Plan Note (Addendum)
Due to neuropathy from prior chemotherapy, I will continue reduceed dose of carboplatin as before.

## 2015-01-17 NOTE — Assessment & Plan Note (Signed)
This is likely anemia of chronic disease. The patient denies recent history of bleeding such as epistaxis, hematuria or hematochezia. She is asymptomatic from the anemia. We will observe for now.  

## 2015-01-17 NOTE — Assessment & Plan Note (Signed)
I discussed the case with the [place] from [facility]. She recommended switching her treatment to RICE regimen for 2 cycles before repeat PET/CT scan. Some of the expected risks, benefits, and side effects including risk of pancytopenia, hair loss, nausea, vomiting & risks of infection are discussed with the patient. She tolerated cycle 1 well apart from development of sinusitis, treated with antibiotics and cold sores, resolved with acyclovir Due to her baseline peripheral neuropathy, I will reduce the dose of carboplatin a little bit. She will be admitted tomorrow on 01/18/2015 for cycle 2. She will get a PET CT scan scheduled on 02/02/2015 to assess response to treatment.

## 2015-01-18 ENCOUNTER — Ambulatory Visit: Payer: Medicare Other

## 2015-01-18 ENCOUNTER — Encounter (HOSPITAL_COMMUNITY): Payer: Self-pay

## 2015-01-18 ENCOUNTER — Inpatient Hospital Stay (HOSPITAL_COMMUNITY)
Admission: RE | Admit: 2015-01-18 | Discharge: 2015-01-20 | DRG: 847 | Disposition: A | Discharge status: Home / Self Care | Payer: Medicare Other | Source: Ambulatory Visit | Attending: Hematology and Oncology | Admitting: Hematology and Oncology

## 2015-01-18 DIAGNOSIS — T451X5A Adverse effect of antineoplastic and immunosuppressive drugs, initial encounter: Secondary | ICD-10-CM | POA: Diagnosis present

## 2015-01-18 DIAGNOSIS — Z8249 Family history of ischemic heart disease and other diseases of the circulatory system: Secondary | ICD-10-CM

## 2015-01-18 DIAGNOSIS — J3089 Other allergic rhinitis: Secondary | ICD-10-CM

## 2015-01-18 DIAGNOSIS — C8228 Follicular lymphoma grade III, unspecified, lymph nodes of multiple sites: Secondary | ICD-10-CM

## 2015-01-18 DIAGNOSIS — Z808 Family history of malignant neoplasm of other organs or systems: Secondary | ICD-10-CM | POA: Diagnosis not present

## 2015-01-18 DIAGNOSIS — Z825 Family history of asthma and other chronic lower respiratory diseases: Secondary | ICD-10-CM

## 2015-01-18 DIAGNOSIS — Z888 Allergy status to other drugs, medicaments and biological substances status: Secondary | ICD-10-CM

## 2015-01-18 DIAGNOSIS — Z833 Family history of diabetes mellitus: Secondary | ICD-10-CM | POA: Diagnosis not present

## 2015-01-18 DIAGNOSIS — R011 Cardiac murmur, unspecified: Secondary | ICD-10-CM | POA: Diagnosis present

## 2015-01-18 DIAGNOSIS — C8248 Follicular lymphoma grade IIIb, lymph nodes of multiple sites: Secondary | ICD-10-CM | POA: Diagnosis present

## 2015-01-18 DIAGNOSIS — G4700 Insomnia, unspecified: Secondary | ICD-10-CM | POA: Diagnosis present

## 2015-01-18 DIAGNOSIS — C8220 Follicular lymphoma grade III, unspecified, unspecified site: Secondary | ICD-10-CM | POA: Diagnosis present

## 2015-01-18 DIAGNOSIS — M1990 Unspecified osteoarthritis, unspecified site: Secondary | ICD-10-CM | POA: Diagnosis present

## 2015-01-18 DIAGNOSIS — Z85118 Personal history of other malignant neoplasm of bronchus and lung: Secondary | ICD-10-CM | POA: Diagnosis not present

## 2015-01-18 DIAGNOSIS — Z881 Allergy status to other antibiotic agents status: Secondary | ICD-10-CM | POA: Diagnosis not present

## 2015-01-18 DIAGNOSIS — Z885 Allergy status to narcotic agent status: Secondary | ICD-10-CM

## 2015-01-18 DIAGNOSIS — Z882 Allergy status to sulfonamides status: Secondary | ICD-10-CM | POA: Diagnosis not present

## 2015-01-18 DIAGNOSIS — D638 Anemia in other chronic diseases classified elsewhere: Secondary | ICD-10-CM | POA: Diagnosis present

## 2015-01-18 DIAGNOSIS — G620 Drug-induced polyneuropathy: Secondary | ICD-10-CM

## 2015-01-18 DIAGNOSIS — K219 Gastro-esophageal reflux disease without esophagitis: Secondary | ICD-10-CM

## 2015-01-18 DIAGNOSIS — E039 Hypothyroidism, unspecified: Secondary | ICD-10-CM | POA: Diagnosis present

## 2015-01-18 DIAGNOSIS — B001 Herpesviral vesicular dermatitis: Secondary | ICD-10-CM

## 2015-01-18 DIAGNOSIS — Z880 Allergy status to penicillin: Secondary | ICD-10-CM | POA: Diagnosis not present

## 2015-01-18 DIAGNOSIS — N951 Menopausal and female climacteric states: Secondary | ICD-10-CM

## 2015-01-18 DIAGNOSIS — Z86010 Personal history of colonic polyps: Secondary | ICD-10-CM | POA: Diagnosis not present

## 2015-01-18 DIAGNOSIS — I739 Peripheral vascular disease, unspecified: Secondary | ICD-10-CM | POA: Diagnosis present

## 2015-01-18 DIAGNOSIS — Z8701 Personal history of pneumonia (recurrent): Secondary | ICD-10-CM

## 2015-01-18 DIAGNOSIS — Z85038 Personal history of other malignant neoplasm of large intestine: Secondary | ICD-10-CM

## 2015-01-18 DIAGNOSIS — Z85828 Personal history of other malignant neoplasm of skin: Secondary | ICD-10-CM | POA: Diagnosis not present

## 2015-01-18 DIAGNOSIS — Z8571 Personal history of Hodgkin lymphoma: Secondary | ICD-10-CM

## 2015-01-18 DIAGNOSIS — E038 Other specified hypothyroidism: Secondary | ICD-10-CM

## 2015-01-18 DIAGNOSIS — F329 Major depressive disorder, single episode, unspecified: Secondary | ICD-10-CM | POA: Diagnosis present

## 2015-01-18 DIAGNOSIS — Z5111 Encounter for antineoplastic chemotherapy: Principal | ICD-10-CM

## 2015-01-18 MED ORDER — LEVOTHYROXINE SODIUM 112 MCG PO TABS
112.0000 ug | ORAL_TABLET | Freq: Every day | ORAL | Status: DC
  Administered 2015-01-19 – 2015-01-20 (×2): 112 ug via ORAL
  Filled 2015-01-18 (×3): qty 1

## 2015-01-18 MED ORDER — ONDANSETRON 4 MG PO TBDP: 4.0000 mg | ORAL_TABLET | Freq: Three times a day (TID) | ORAL | Status: DC | PRN

## 2015-01-18 MED ORDER — SODIUM CHLORIDE 0.9 % IJ SOLN: 3.0000 mL | INTRAMUSCULAR | Status: DC | PRN

## 2015-01-18 MED ORDER — HEPARIN SOD (PORK) LOCK FLUSH 100 UNIT/ML IV SOLN: 250.0000 [IU] | Freq: Once | INTRAVENOUS | Status: DC | PRN

## 2015-01-18 MED ORDER — HEPARIN SOD (PORK) LOCK FLUSH 100 UNIT/ML IV SOLN
500.0000 [IU] | Freq: Once | INTRAVENOUS | Status: DC | PRN
  Filled 2015-01-18: qty 5

## 2015-01-18 MED ORDER — LIDOCAINE-PRILOCAINE 2.5-2.5 % EX CREA: 1.0000 "application " | TOPICAL_CREAM | CUTANEOUS | Status: DC | PRN

## 2015-01-18 MED ORDER — ONDANSETRON HCL 4 MG/2ML IJ SOLN
4.0000 mg | Freq: Three times a day (TID) | INTRAMUSCULAR | Status: DC | PRN
  Administered 2015-01-19: 4 mg via INTRAVENOUS
  Filled 2015-01-18: qty 2

## 2015-01-18 MED ORDER — DEXAMETHASONE SODIUM PHOSPHATE 100 MG/10ML IJ SOLN
10.0000 mg | Freq: Once | INTRAMUSCULAR | Status: AC
  Administered 2015-01-18: 10 mg via INTRAVENOUS
  Filled 2015-01-18: qty 1

## 2015-01-18 MED ORDER — GABAPENTIN 300 MG PO CAPS
300.0000 mg | ORAL_CAPSULE | Freq: Two times a day (BID) | ORAL | Status: DC
  Administered 2015-01-18 – 2015-01-20 (×5): 300 mg via ORAL
  Filled 2015-01-18 (×5): qty 1

## 2015-01-18 MED ORDER — LORATADINE 10 MG PO TABS
10.0000 mg | ORAL_TABLET | Freq: Every day | ORAL | Status: DC
  Administered 2015-01-19 – 2015-01-20 (×2): 10 mg via ORAL
  Filled 2015-01-18 (×2): qty 1

## 2015-01-18 MED ORDER — VITAMIN D 1000 UNITS PO TABS
1000.0000 [IU] | ORAL_TABLET | Freq: Every day | ORAL | Status: DC
  Administered 2015-01-19 – 2015-01-20 (×2): 1000 [IU] via ORAL
  Filled 2015-01-18 (×2): qty 1

## 2015-01-18 MED ORDER — SODIUM CHLORIDE 0.9 % IV SOLN: INTRAVENOUS | Status: DC

## 2015-01-18 MED ORDER — ACETAMINOPHEN 325 MG PO TABS: 650.0000 mg | ORAL_TABLET | ORAL | Status: DC | PRN

## 2015-01-18 MED ORDER — LIDOCAINE-PRILOCAINE 2.5-2.5 % EX CREA: TOPICAL_CREAM | Freq: Once | CUTANEOUS | Status: DC

## 2015-01-18 MED ORDER — SENNOSIDES-DOCUSATE SODIUM 8.6-50 MG PO TABS
1.0000 | ORAL_TABLET | Freq: Two times a day (BID) | ORAL | Status: DC
  Administered 2015-01-18 – 2015-01-20 (×5): 1 via ORAL
  Filled 2015-01-18 (×5): qty 1

## 2015-01-18 MED ORDER — ONDANSETRON HCL 4 MG PO TABS: 4.0000 mg | ORAL_TABLET | Freq: Three times a day (TID) | ORAL | Status: DC | PRN

## 2015-01-18 MED ORDER — ALUM & MAG HYDROXIDE-SIMETH 200-200-20 MG/5ML PO SUSP: 60.0000 mL | ORAL | Status: DC | PRN

## 2015-01-18 MED ORDER — ACETAMINOPHEN ER 650 MG PO TBCR: 650.0000 mg | EXTENDED_RELEASE_TABLET | Freq: Three times a day (TID) | ORAL | Status: DC | PRN

## 2015-01-18 MED ORDER — ENOXAPARIN SODIUM 40 MG/0.4ML ~~LOC~~ SOLN
40.0000 mg | SUBCUTANEOUS | Status: DC
  Administered 2015-01-18 – 2015-01-20 (×3): 40 mg via SUBCUTANEOUS
  Filled 2015-01-18 (×3): qty 0.4

## 2015-01-18 MED ORDER — DOCUSATE SODIUM 100 MG PO CAPS
100.0000 mg | ORAL_CAPSULE | Freq: Every day | ORAL | Status: DC
  Administered 2015-01-18 – 2015-01-19 (×2): 100 mg via ORAL
  Filled 2015-01-18 (×2): qty 1

## 2015-01-18 MED ORDER — FLUTICASONE PROPIONATE 50 MCG/ACT NA SUSP
1.0000 | Freq: Every day | NASAL | Status: DC
  Administered 2015-01-19 – 2015-01-20 (×2): 1 via NASAL
  Filled 2015-01-18: qty 16

## 2015-01-18 MED ORDER — HOT PACK MISC ONCOLOGY
1.0000 | Freq: Once | Status: AC | PRN
  Filled 2015-01-18: qty 1

## 2015-01-18 MED ORDER — SODIUM CHLORIDE 0.9 % IV SOLN
8.0000 mg | Freq: Three times a day (TID) | INTRAVENOUS | Status: DC | PRN
  Filled 2015-01-18: qty 4

## 2015-01-18 MED ORDER — SIMVASTATIN 40 MG PO TABS
40.0000 mg | ORAL_TABLET | Freq: Every day | ORAL | Status: DC
  Administered 2015-01-18 – 2015-01-19 (×2): 40 mg via ORAL
  Filled 2015-01-18 (×2): qty 1

## 2015-01-18 MED ORDER — PALONOSETRON HCL INJECTION 0.25 MG/5ML
0.2500 mg | Freq: Once | INTRAVENOUS | Status: AC
  Administered 2015-01-18: 0.25 mg via INTRAVENOUS
  Filled 2015-01-18: qty 5

## 2015-01-18 MED ORDER — OXYCODONE HCL 5 MG PO TABS: 5.0000 mg | ORAL_TABLET | ORAL | Status: DC | PRN

## 2015-01-18 MED ORDER — ACETAMINOPHEN 325 MG PO TABS
650.0000 mg | ORAL_TABLET | Freq: Once | ORAL | Status: AC
  Administered 2015-01-18: 650 mg via ORAL
  Filled 2015-01-18: qty 2

## 2015-01-18 MED ORDER — ESTRADIOL 1 MG PO TABS
1.0000 mg | ORAL_TABLET | Freq: Every day | ORAL | Status: DC
  Administered 2015-01-19 – 2015-01-20 (×2): 1 mg via ORAL
  Filled 2015-01-18 (×2): qty 1

## 2015-01-18 MED ORDER — ALTEPLASE 2 MG IJ SOLR: 2.0000 mg | Freq: Once | INTRAMUSCULAR | Status: DC | PRN

## 2015-01-18 MED ORDER — SODIUM CHLORIDE 0.9 % IV SOLN
100.0000 mg/m2 | Freq: Once | INTRAVENOUS | Status: AC
  Administered 2015-01-18: 230 mg via INTRAVENOUS
  Filled 2015-01-18 (×2): qty 11.5

## 2015-01-18 MED ORDER — SODIUM CHLORIDE 0.9 % IV SOLN
375.0000 mg/m2 | Freq: Once | INTRAVENOUS | Status: AC
  Administered 2015-01-18: 800 mg via INTRAVENOUS
  Filled 2015-01-18: qty 60

## 2015-01-18 MED ORDER — CALCIUM CARBONATE-VITAMIN D 500-200 MG-UNIT PO TABS
1.0000 | ORAL_TABLET | Freq: Every evening | ORAL | Status: DC
  Administered 2015-01-19: 1 via ORAL
  Filled 2015-01-18 (×2): qty 1

## 2015-01-18 MED ORDER — FLUOXETINE HCL 20 MG PO CAPS
20.0000 mg | ORAL_CAPSULE | Freq: Every day | ORAL | Status: DC
  Administered 2015-01-19 – 2015-01-20 (×2): 20 mg via ORAL
  Filled 2015-01-18 (×2): qty 1

## 2015-01-18 MED ORDER — HYDROCODONE-ACETAMINOPHEN 5-325 MG PO TABS
1.0000 | ORAL_TABLET | ORAL | Status: DC | PRN
  Administered 2015-01-19 – 2015-01-20 (×5): 2 via ORAL
  Filled 2015-01-18 (×5): qty 2

## 2015-01-18 MED ORDER — ACYCLOVIR 400 MG PO TABS
400.0000 mg | ORAL_TABLET | Freq: Every day | ORAL | Status: DC
  Administered 2015-01-19 – 2015-01-20 (×2): 400 mg via ORAL
  Filled 2015-01-18 (×2): qty 1

## 2015-01-18 MED ORDER — POLYETHYLENE GLYCOL 3350 17 G PO PACK
17.0000 g | PACK | Freq: Every day | ORAL | Status: DC
  Administered 2015-01-18 – 2015-01-19 (×2): 17 g via ORAL
  Filled 2015-01-18 (×2): qty 1

## 2015-01-18 MED ORDER — PANTOPRAZOLE SODIUM 40 MG PO TBEC
40.0000 mg | DELAYED_RELEASE_TABLET | Freq: Every day | ORAL | Status: DC
  Administered 2015-01-19 – 2015-01-20 (×2): 40 mg via ORAL
  Filled 2015-01-18 (×2): qty 1

## 2015-01-18 MED ORDER — SODIUM CHLORIDE 0.9 % IV SOLN
INTRAVENOUS | Status: DC
  Administered 2015-01-18 – 2015-01-19 (×2): via INTRAVENOUS

## 2015-01-18 MED ORDER — SODIUM CHLORIDE 0.9 % IJ SOLN: 10.0000 mL | INTRAMUSCULAR | Status: DC | PRN

## 2015-01-18 MED ORDER — DIPHENHYDRAMINE HCL 50 MG PO CAPS
50.0000 mg | ORAL_CAPSULE | Freq: Once | ORAL | Status: AC
  Administered 2015-01-18: 50 mg via ORAL
  Filled 2015-01-18: qty 1

## 2015-01-18 NOTE — H&P (Signed)
[facility] Admission NOTE  Patient Care Team: Abner Greenspan, MD as PCP - General Mosetta Anis, MD (Allergy) Fanny Skates, MD as Consulting Physician (General Surgery) Grace Isaac, MD as Consulting Physician (Cardiothoracic Surgery)  CHIEF COMPLAINTS/PURPOSE OF ADMISSION:   cycle 2 of chemotherapy with R-ICE  HISTORY OF PRESENTING ILLNESS:  [name] 74 y.o. female is  Admitted today for cycle 3 of chemotherapy. Summary of oncologic history as follows: Oncology History   History of colon cancer   Staging form: Colon and Rectum, AJCC 7th Edition     Clinical: Stage IIA (T3, N0, M0) - Signed by [name], MD on 02/21/2014 History of hodgkin's lymphoma   Staging form: Lymphoid Neoplasms, AJCC 6th Edition     Clinical: Stage IV - Signed by [name], MD on 02/21/2014       History of hodgkin's lymphoma   05/20/2013 Initial Diagnosis Hodgkin lymphoma   06/27/2014 Imaging Interval increase in metabolic activity of several small lymph nodes is concerning for lymphoma recurrence. Lymph nodes include a small right level 2 lymph node, right hilar lymph node, and right paratracheal lymph node   07/07/2014 Pathology Results  limited tissue from ultrasound-guided biopsy was nondiagnostic   07/07/2014 Procedure  ultrasound-guided biopsy was nondiagnostic   09/27/2014 Imaging Repeat PET CT scan show diffuse disease concern for relapse.   10/17/2014 Pathology Results Accession: [id number] Biopsy was nondiagnostic   10/17/2014 Procedure She underwent bronchoscopy & EBUS & biopsy of  LN at Level 10L   11/01/2014 Surgery She underwent cronchoscopy with endobronchial ultrasound, mediastinoscopy and biopsy    11/01/2014 Pathology Results Accession: [id number] biopsy showed no evidence of lymphoma, only granulomas.   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 Her posttreatment course was complicated by maxillary sinus infection requiring antibiotics and development of herpes labialis requiring acyclovir. She has fully recovered from all recent infections. She responded well to treatment with resolution of the lymphadenopathy over the left parotid region. She is ready for cycle 2 of treatment today  MEDICAL HISTORY:  Past Medical History  Diagnosis Date  . Colon polyps   . Depression   . Hypothyroidism   . Osteoarthritis     hands  . Peripheral vascular disease ([place])   . Degenerative disk disease     spine in center in past   . 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 Cancer ([place])     skin cancer- basal cell on arm / colon 2011  . Lymphoma ([place])   . Colon cancer ([place])     08-2009  . Neuropathy due to chemotherapeutic drug ([place]) 06/19/2014  . Cancer ([place])     colon CA and lung CA  . Heart murmur   . GERD (gastroesophageal reflux disease)   . Anemia     hx blood tx  . Pneumonia   . Follicular lymphoma grade  III of lymph nodes of multiple sites ([place]) 12/11/2014    SURGICAL HISTORY: Past Surgical History  Procedure Laterality Date  . Cholecystectomy    . Splenectomy      lymphoma  . Breast biopsy  1996  . Plantar fascia release    . Ventral hernia repair  1998  . Tendon release      Right thumb   . Colectomy  8/11  . Vaginal hysterectomy    . Bone marrow biopsy  05/27/13  . Cataract extraction, bilateral  2016  . Port-a-cath  insertion    . Colonoscopy w/ biopsies    . Dg biopsy lung    . Video bronchoscopy with endobronchial ultrasound N/A 10/17/2014    Procedure: VIDEO BRONCHOSCOPY WITH ENDOBRONCHIAL ULTRASOUND;  Surgeon: Javier Glazier, MD;  Location: [facility];  Service: Thoracic;  Laterality: N/A;  . Video bronchoscopy with endobronchial ultrasound N/A 11/01/2014    Procedure: VIDEO BRONCHOSCOPY WITH ENDOBRONCHIAL ULTRASOUND;  Surgeon: Grace Isaac, MD;  Location: [facility];  Service: Thoracic;  Laterality: N/A;  . Mediastinoscopy N/A 11/01/2014    Procedure: MEDIASTINOSCOPY;  Surgeon: Grace Isaac, MD;  Location: [facility];  Service: Thoracic;  Laterality: N/A;  . Lymph node biopsy N/A 11/01/2014    Procedure: MEDIASTINAL LYMPH NODE BIOPSY;  Surgeon: Grace Isaac, MD;  Location: [facility];  Service: Thoracic;  Laterality: N/A;    SOCIAL HISTORY: Social History   Social History  . Marital Status: Married    Spouse Name: N/A  . Number of Children: 2  . Years of Education: N/A   Occupational History  . Retired     Social History Main Topics  . Smoking status: Never Smoker   . Smokeless tobacco: Never Used     Comment: Second-hand exposure through father.  . Alcohol Use: No  . Drug Use: No  . Sexual Activity: No   Other Topics Concern  . Not on file   Social History Narrative   Originally from [place]. Always lived in [place]. Previously has traveled to [facility], [place] & up [employer] to [place]. No international travel. No pets currently. Remote parakeet exposure with her children. 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        FAMILY HISTORY: Family History  Problem Relation Age of Onset  . Coronary artery disease Mother   . Alcohol abuse Mother   . Diabetes Mother   . Cancer Father     brain  . Diabetes Brother   . Fibromyalgia Daughter     chronic pain   . COPD Daughter   . Colon cancer Neg Hx   . Colon polyps Neg Hx   . Stomach cancer Neg Hx   . Rectal cancer Neg Hx   .  Ulcerative colitis Neg Hx   . Crohn's disease Neg Hx   . Asthma Daughter   . Rheum arthritis Brother   . Clotting disorder Brother     ALLERGIES:  is allergic to achromycin; allopurinol; astelin; cephalexin; codeine; meloxicam; minocycline; nabumetone; nyquil; penicillins; sulfa antibiotics; zolpidem tartrate; buspar; and ciprofloxacin.  MEDICATIONS:  Current Facility-Administered Medications  Medication Dose Route Frequency Provider Last Rate Last Dose  . 0.9 %  sodium chloride infusion   Intravenous Continuous [name], MD 50 mL/hr at 01/18/15 1010    . 0.9 %  sodium chloride infusion   Intravenous Continuous [name], MD      . acetaminophen (TYLENOL) tablet 650 mg  650 mg Oral Q4H PRN  [name], MD      . acetaminophen (TYLENOL) tablet 650 mg  650 mg Oral Once [name], MD      . Derrill Memo ON 01/19/2015] acyclovir (ZOVIRAX) tablet 400 mg  400 mg Oral Daily Tashan Kreitzer, MD      . alteplase (CATHFLO ACTIVASE) injection 2 mg  2 mg Intracatheter Once PRN [name], MD      . alum & mag hydroxide-simeth (MAALOX/MYLANTA) 200-200-20 MG/5ML suspension 60 mL  60 mL Oral Q4H PRN [name], MD      . calcium-vitamin D (OSCAL WITH D) 500-200 MG-UNIT per tablet 1 tablet  1 tablet Oral QPM [name], MD      . Derrill Memo ON 01/19/2015] cholecalciferol (VITAMIN D) tablet 1,000 Units  1,000 Units Oral Daily [name], MD      . dexamethasone (DECADRON) 10 mg in sodium chloride 0.9 % 50 mL IVPB  10 mg Intravenous Once [name], MD      . diphenhydrAMINE (BENADRYL) capsule 50 mg  50 mg Oral Once [name], MD      . docusate sodium (COLACE) capsule 100 mg  100 mg Oral QHS Eddye Broxterman, MD      . enoxaparin (LOVENOX) injection 40 mg  40 mg Subcutaneous Q24H [name], MD      . Derrill Memo ON 01/19/2015] estradiol (ESTRACE) tablet 1 mg  1 mg Oral Daily Herchel Hopkin, MD      . etoposide (VEPESID) 230 mg in sodium chloride 0.9 % 600 mL chemo infusion  100 mg/m2 (Treatment Plan Actual) Intravenous Once [name], MD      . Derrill Memo ON 01/19/2015] FLUoxetine (PROZAC) capsule 20 mg  20 mg Oral Daily [name], MD      . Derrill Memo ON 01/19/2015] fluticasone (FLONASE) 50 MCG/ACT nasal spray 1 spray  1 spray Each Nare Daily Korey Arroyo, MD      . gabapentin (NEURONTIN) capsule 300 mg  300 mg Oral BID [name], MD      . heparin lock flush 100 unit/mL  500 Units Intracatheter Once PRN [name], MD      . heparin lock flush 100 unit/mL  250 Units Intracatheter Once PRN [name], MD      . Hot Pack 1 packet  1 packet Topical Once PRN [name], MD      . HYDROcodone-acetaminophen (NORCO/VICODIN) 5-325 MG per tablet 1-2 tablet  1-2 tablet Oral Q4H PRN [name], MD      . Derrill Memo ON 01/19/2015] levothyroxine (SYNTHROID, LEVOTHROID) tablet 112 mcg  112 mcg Oral QAC breakfast [name], MD      . lidocaine-prilocaine (EMLA) cream 1 application  1 application Topical PRN [name], MD      . lidocaine-prilocaine (EMLA) cream   Topical Once [name], MD      . Derrill Memo ON 01/19/2015] loratadine (CLARITIN) tablet 10 mg  10 mg Oral Daily Amirra Herling, MD      . ondansetron (ZOFRAN) tablet 4-8 mg  4-8 mg Oral Q8H PRN [name], MD       Or  . ondansetron (ZOFRAN-ODT) disintegrating tablet 4-8 mg  4-8 mg Oral Q8H PRN [name], MD       Or  . ondansetron (ZOFRAN) injection 4 mg  4 mg Intravenous Q8H PRN Weslie Pretlow, MD       Or  . ondansetron (ZOFRAN) 8 mg in sodium chloride 0.9 % 50 mL IVPB  8 mg Intravenous Q8H PRN Mera Gunkel  Alvy Bimler, MD      . oxyCODONE (Oxy IR/ROXICODONE) immediate release tablet 5 mg  5 mg Oral Q4H PRN [name], MD      . palonosetron (ALOXI) injection 0.25 mg  0.25 mg Intravenous Once [name], MD      . Derrill Memo ON 01/19/2015] pantoprazole (PROTONIX) EC tablet 40 mg  40 mg Oral Daily Lillyanne Bradburn, MD      . polyethylene glycol (MIRALAX / GLYCOLAX) packet 17 g  17 g Oral QHS Clydell Alberts, MD      . riTUXimab (RITUXAN) 800 mg in sodium chloride 0.9 % 250 mL (2.4242 mg/mL) chemo infusion  375 mg/m2  (Treatment Plan Actual) Intravenous Once [name], MD      . senna-docusate (Senokot-S) tablet 1 tablet  1 tablet Oral BID [name], MD      . simvastatin (ZOCOR) tablet 40 mg  40 mg Oral QHS Mekisha Bittel, MD      . sodium chloride 0.9 % injection 10 mL  10 mL Intracatheter PRN Mulki Roesler, MD      . sodium chloride 0.9 % injection 3 mL  3 mL Intravenous PRN [name], MD        REVIEW OF SYSTEMS:   Constitutional: Denies fevers, chills or abnormal night sweats Eyes: Denies blurriness of vision, double vision or watery eyes Ears, nose, mouth, throat, and face: Denies mucositis or sore throat Respiratory: Denies cough, dyspnea or wheezes Cardiovascular: Denies palpitation, chest discomfort or lower extremity swelling Gastrointestinal:  Denies nausea, heartburn or change in bowel habits Skin: Denies abnormal skin rashes Lymphatics: Denies new lymphadenopathy or easy bruising Neurological:Denies numbness, tingling or new weaknesses Behavioral/Psych: Mood is stable, no new changes  All other systems were reviewed with the patient and are negative.  PHYSICAL EXAMINATION: ECOG PERFORMANCE STATUS: 1 - Symptomatic but completely ambulatory  Filed Vitals:   01/18/15 0840  BP: 146/78  Pulse: 95  Temp: 98.3 F (36.8 C)  Resp: 18   There were no vitals filed for this visit.  GENERAL:alert, no distress and comfortable SKIN:  She has very fine dry skin crusting over the left nasal area. EYES: normal, conjunctiva are pink and non-injected, sclera clear OROPHARYNX:no exudate, no erythema and lips, buccal mucosa, and tongue normal  NECK: supple, thyroid normal size, non-tender, without nodularity LYMPH:  no palpable lymphadenopathy in the cervical, axillary or inguinal LUNGS: clear to auscultation and percussion with normal breathing effort HEART: regular rate & rhythm and no murmurs and no lower extremity edema ABDOMEN:abdomen soft, non-tender and normal bowel sounds Musculoskeletal:no  cyanosis of digits and no clubbing  PSYCH: alert & oriented x 3 with fluent speech NEURO: no focal motor/sensory deficits  LABORATORY DATA:  I have reviewed the data as listed Lab Results  Component Value Date   WBC 7.5 01/17/2015   HGB 10.7* 01/17/2015   HCT 32.0* 01/17/2015   MCV 91.7 01/17/2015   PLT 576* 01/17/2015    Recent Labs  11/01/14 0708  12/29/14 0542 12/30/14 0413 01/10/15 1041 01/12/15 1336 01/17/15 0806  NA 140  < > 139 139 140 139 140  K 3.8  < > 4.3 4.1 3.7 4.3 3.9  CL 104  --  108 107  --   --   --   CO2 28  < > 24 24 26 27 24   GLUCOSE 96  < > 131* 126* 123 127 135  BUN 11  < > 12 16 8.0 8.7 8.4  CREATININE 0.77  < >  0.68 0.61 0.8 0.8 0.8  CALCIUM 9.4  < > 8.9 9.0 8.8 9.3 9.4  GFRNONAA >60  --  >60 >60  --   --   --   GFRAA >60  --  >60 >60  --   --   --   PROT 6.4*  < > 6.0* 6.4* 6.3* 6.3* 6.3*  ALBUMIN 4.0  < > 3.7 3.9 3.4* 3.5 3.5  AST 29  < > 65* 108* 29 25 22   ALT 22  < > 50 108* 36 26 18  ALKPHOS 123  < > 126 135* 263* 262* 217*  BILITOT 0.5  < > 0.5 0.9 0.32 <0.30 <0.30  < > = values in this interval not displayed.  RADIOGRAPHIC STUDIES: I have personally reviewed the radiological images as listed and agreed with the findings in the report. Dg Chest Port 1 View  12/30/2014  CLINICAL DATA:  Shortness of breath. EXAM: PORTABLE CHEST 1 VIEW COMPARISON:  Chest radiograph 11/01/2014.  PET-CT 12/13/2014 FINDINGS: Tip of the right chest port in the SVC. Mild cardiomegaly is unchanged. Development of mild pulmonary edema. Question left pleural effusion. Ill-defined linear opacities at the lung bases, likely atelectasis. No pneumothorax. No confluent airspace disease. IMPRESSION: 1. Development of pulmonary edema.  Suspect left pleural effusion. 2. Stable mild cardiomegaly. Electronically Signed   By: Jeb Levering M.D.   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 She will start cycle 2 today. She will get a PET CT scan scheduled on 02/02/2015 to assess response to treatment.  Anemia in chronic illness This is likely anemia of chronic disease. The patient denies recent history of bleeding such as epistaxis, hematuria or hematochezia. She is asymptomatic from the anemia. We will observe for now.   Cold sore This has almost completely resolved. She has completed high-dose treatment. I will start her on prophylactic dose with 400 mg daily acyclovir daily  Peripheral neuropathy due to chemotherapy ([facility]) Due to neuropathy from prior chemotherapy, I will continue reduceed dose of carboplatin as before.  Hypothyroidism We will resume levothyroxine  DVT prophylaxis On lovenox  CODE STATUS Full code  Discharge planning Hopefully, she can be discharged home on 01/20/2015 after treatment is completed  All questions were answered. The patient knows to call the clinic with any problems, questions or concerns.    [place], [place], MD 01/18/2015 11:12 AM

## 2015-01-18 NOTE — Progress Notes (Addendum)
Chemotherapy dosages and calculations checked  With Aldean Baker, RN.

## 2015-01-19 ENCOUNTER — Ambulatory Visit: Payer: Medicare Other

## 2015-01-19 MED ORDER — ETOPOSIDE CHEMO INJECTION 1 GM/50ML
100.0000 mg/m2 | Freq: Once | INTRAVENOUS | Status: AC
  Administered 2015-01-19: 230 mg via INTRAVENOUS
  Filled 2015-01-19: qty 11.5

## 2015-01-19 MED ORDER — DIPHENHYDRAMINE HCL 50 MG PO CAPS
50.0000 mg | ORAL_CAPSULE | Freq: Every day | ORAL | Status: DC
  Administered 2015-01-19: 50 mg via ORAL
  Filled 2015-01-19: qty 1

## 2015-01-19 MED ORDER — SODIUM CHLORIDE 0.9 % IV SOLN
Freq: Once | INTRAVENOUS | Status: AC
  Administered 2015-01-19: 18:00:00 via INTRAVENOUS
  Filled 2015-01-19: qty 226

## 2015-01-19 MED ORDER — DIPHENHYDRAMINE HCL 50 MG PO CAPS: 50.0000 mg | ORAL_CAPSULE | Freq: Every day | ORAL | Status: DC

## 2015-01-19 MED ORDER — DEXAMETHASONE SODIUM PHOSPHATE 100 MG/10ML IJ SOLN
10.0000 mg | Freq: Once | INTRAMUSCULAR | Status: AC
  Administered 2015-01-19: 10 mg via INTRAVENOUS
  Filled 2015-01-19: qty 1

## 2015-01-19 MED ORDER — SODIUM CHLORIDE 0.9 % IV SOLN
430.0000 mg | Freq: Once | INTRAVENOUS | Status: AC
  Administered 2015-01-19: 430 mg via INTRAVENOUS
  Filled 2015-01-19: qty 43

## 2015-01-19 NOTE — Progress Notes (Signed)
Diamond Boyd   DOB:05-16-1940   TK#:[id number]   FTD#:[id number]  I have seen the patient, examined her and edited the notes as follows  Subjective: Patient seen and examined. She had insomnia during the night. Tolerated day 1 chemo well. Denies fevers, chills, night sweats, vision changes, or mucositis. Denies shortness of breath or cough. Denies any chest pain or palpitations. Denies lower extremity swelling. Denies nausea, heartburn or change in bowel habits. Appetite is normal. Denies any dysuria. Denies abnormal skin rashes, or neuropathy. Denies any bleeding issues such as epistaxis, hematemesis, hematuria or hematochezia. Ambulating without difficulty.  Scheduled Meds: . acyclovir  400 mg Oral Daily  . calcium-vitamin D  1 tablet Oral QPM  . CARBOplatin  430 mg Intravenous Once  . cholecalciferol  1,000 Units Oral Daily  . dexamethasone (DECADRON) IVPB [facility]  10 mg Intravenous Once  . docusate sodium  100 mg Oral QHS  . enoxaparin (LOVENOX) injection  40 mg Subcutaneous Q24H  . estradiol  1 mg Oral Daily  . etoposide  100 mg/m2 (Treatment Plan Actual) Intravenous Once  . FLUoxetine  20 mg Oral Daily  . fluticasone  1 spray Each Nare Daily  . gabapentin  300 mg Oral BID  . ifosfamide/mesna (IFEX/MESNEX) CHEMO for Inpatient CI in 1000 ml   Intravenous Once  . levothyroxine  112 mcg Oral QAC breakfast  . lidocaine-prilocaine   Topical Once  . loratadine  10 mg Oral Daily  . pantoprazole  40 mg Oral Daily  . polyethylene glycol  17 g Oral QHS  . senna-docusate  1 tablet Oral BID  . simvastatin  40 mg Oral QHS   Continuous Infusions: . sodium chloride 50 mL/hr at 01/19/15 0452  . sodium chloride     PRN Meds:.acetaminophen, alteplase, alum & mag hydroxide-simeth, heparin lock flush, heparin lock flush, HYDROcodone-acetaminophen, lidocaine-prilocaine, ondansetron **OR** ondansetron **OR** ondansetron (ZOFRAN) IV **OR** ondansetron (ZOFRAN) IV, oxyCODONE, sodium chloride, sodium  chloride  Objective:  Filed Vitals:   01/18/15 2110 01/19/15 0548  BP: 129/64 121/62  Pulse: 84 82  Temp: 98.3 F (36.8 C) 97.8 F (36.6 C)  Resp: 18 20    Body mass index is 37.95 kg/(m^2).  Intake/Output Summary (Last 24 hours) at 01/19/15 0845 Last data filed at 01/18/15 1429  Gross per 24 hour  Intake    360 ml  Output      0 ml  Net    360 ml    GENERAL:alert, no distress and comfortable SKIN: She has very fine dry skin crusting over the left nasal area. EYES: normal, conjunctiva are pink and non-injected, sclera clear OROPHARYNX:no exudate, no erythema and lips, buccal mucosa, and tongue normal  NECK: supple, thyroid normal size, non-tender, without nodularity LYMPH: no palpable lymphadenopathy in the cervical, axillary or inguinal LUNGS: clear to auscultation and percussion with normal breathing effort HEART: regular rate & rhythm and no murmurs and no lower extremity edema ABDOMEN:abdomen soft, non-tender and normal bowel sounds Musculoskeletal:no cyanosis of digits and no clubbing  PSYCH: alert & oriented x 3 with fluent speech NEURO: no focal motor/sensory deficits    CBG (last 3)  No results for input(s): GLUCAP in the last 72 hours.    Labs:   Recent Labs Lab 01/12/15 1336 01/17/15 0806  WBC 9.8 7.5  HGB 10.3* 10.7*  HCT 30.3* 32.0*  PLT 183 576*  MCV 91.0 91.7  MCH 30.9 30.7  MCHC 34.0 33.4  RDW 14.6* 15.2*  LYMPHSABS 1.6 1.4  MONOABS  1.6* 1.5*  EOSABS 0.1 0.0  BASOSABS 0.0 0.0     Chemistries:    Recent Labs Lab 01/12/15 1336 01/17/15 0806  NA 139 140  K 4.3 3.9  CO2 27 24  GLUCOSE 127 135  BUN 8.7 8.4  CREATININE 0.8 0.8  CALCIUM 9.3 9.4  AST 25 22  ALT 26 18  ALKPHOS 262* 217*  BILITOT <0.30 <0.30   Liver Function Tests:  Recent Labs Lab 01/12/15 1336 01/17/15 0806  AST 25 22  ALT 26 18  ALKPHOS 262* 217*  BILITOT <0.30 <0.30  PROT 6.3* 6.3*  ALBUMIN 3.5 3.5     Studies:  No results  found.   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  Anemia in chronic illness This is likely anemia of chronic disease.  The patient denies recent history of bleeding such as epistaxis, hematuria or hematochezia  She is asymptomatic from the anemia. We will observe for now.   Cold sore This has almost completely resolved. She has completed high-dose treatment. She was started on prophylactic dose with 400 mg daily acyclovir daily  Peripheral neuropathy due to chemotherapy ([place]) Due to neuropathy from prior chemotherapy,  will continue reduced dose of carboplatin as before.  Hypothyroidism Levothyroxine was resumed  DVT prophylaxis On lovenox  CODE STATUS Full code  Insomnia Will start Benadryl per patient's request.   Discharge planning Hopefully, she can be discharged home on 01/20/2015 after treatment is completed She will return Monday for Neulasta injection and then PET CT scan as scheduled on 02/02/2015  Rondel Jumbo, PA-C 01/19/2015  8:45 AM Medical Oncology and Hematology [place], [place], MD 01/19/2015

## 2015-01-19 NOTE — Progress Notes (Signed)
Chemo dosages and calculations for etoposide, carboplatin , ifex and mesna done manually with Laural Benes RN.

## 2015-01-19 NOTE — Discharge Summary (Signed)
Patient ID: Diamond Boyd MRN: [id number] DOB/AGE: [birth year] 74 y.o. 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  Anemia in chronic illness This is likely anemia of chronic disease.  The patient denies recent history of bleeding such as epistaxis, hematuria or hematochezia She is asymptomatic from the anemia. We will observe for now.   Cold sore This has almost completely resolved. She has completed high-dose treatment. She was started on prophylactic dose with 400 mg daily acyclovir daily  Peripheral neuropathy due to chemotherapy ([place]) Due to neuropathy from prior chemotherapy, will continue reduced dose of carboplatin as before.  Hypothyroidism Levothyroxine was resumed  DVT prophylaxis On lovenox  CODE STATUS Full code  Insomnia Will start Benadryl per patient's request.   Discharge planning Hopefully, she can be discharged home on 01/20/2015 after treatment is completed She will return Monday for Neulasta  injection and then PET CT scan as scheduled on 02/02/2015  Past Medical History  Diagnosis Date  . Colon polyps   . Depression   . Hypothyroidism   . Osteoarthritis     hands  . Peripheral vascular disease ([place])   . Degenerative disk disease     spine in center in past   . 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 Cancer ([place])     skin cancer- basal cell on arm / colon 2011  . Lymphoma ([place])   . Colon cancer ([place])     08-2009  . Neuropathy due to chemotherapeutic drug ([place]) 06/19/2014  . Cancer ([place])     colon CA and lung CA  . Heart murmur   . GERD (gastroesophageal reflux disease)   . Anemia     hx blood tx  . Pneumonia   . 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     acyclovir 400 MG tablet  Commonly known as:  ZOVIRAX  Take 1 tablet (400 mg total) by mouth daily.     CALCIUM 600+D 600-400 MG-UNIT tablet  Generic drug:  Calcium Carbonate-Vitamin D  Take 1 tablet by mouth every evening.     celecoxib 200 MG  capsule  Commonly known as:  CELEBREX  Take 1 capsule (200 mg total) by mouth daily as needed for moderate pain (with food).     docusate sodium 100 MG capsule  Commonly known as:  COLACE  Take 100 mg by mouth at bedtime.     estradiol 2 MG tablet  Commonly known as:  ESTRACE  Take 0.5 tablets (1 mg total) by mouth daily. Takes 1/2 tablet     fexofenadine 180 MG tablet  Commonly known as:  ALLEGRA  TAKE 1 TABLET (180 MG TOTAL) BY MOUTH DAILY.     FLUoxetine 20 MG capsule  Commonly known as:  PROZAC  Take 20 mg by mouth daily.     fluticasone 50 MCG/ACT nasal spray  Commonly known as:   FLONASE  Place 1 spray into both  nostrils 2 (two) times  daily.     gabapentin 300 MG capsule  Commonly known as:  NEURONTIN  Take 1 capsule (300 mg total) by mouth 2 (two) times daily.     HYDROcodone-acetaminophen 5-325 MG tablet  Commonly known as:  NORCO/VICODIN  Take 1-2 tablets by mouth every 4 (four) hours as needed (For pain.).     levothyroxine 112 MCG tablet  Commonly known as:  SYNTHROID, LEVOTHROID  Take 1 tablet by mouth  daily     lidocaine-prilocaine cream  Commonly known as:  EMLA  Apply 1 application topically as needed (For port-a-cath.).     loratadine 10 MG tablet  Commonly known as:  CLARITIN  Take 10 mg by mouth daily.     Omega-3 350 MG Caps  Take 1 capsule by mouth daily.     omeprazole 40 MG capsule  Commonly known as:  PRILOSEC  Take 1 capsule by mouth two times daily     ondansetron 8 MG tablet  Commonly known as:  ZOFRAN  Take 8 mg by mouth every 6 (six) hours as needed for nausea or vomiting.     polyethylene glycol packet  Commonly known as:  MIRALAX / GLYCOLAX  Take 17 g by mouth at bedtime.     prochlorperazine 10 MG tablet  Commonly known as:  COMPAZINE  Take 10 mg by mouth every 6 (six) hours as needed for nausea or vomiting.     psyllium 58.6 % packet  Commonly known as:  METAMUCIL  Take 1-3 packets by mouth at bedtime.     simvastatin 40 MG tablet  Commonly known as:  ZOCOR  Take 40 mg by mouth at bedtime.     Vitamin D-3 1000 UNITS Caps  Take 1,000 Units by mouth daily.     vitamin E 400 UNIT capsule  Take 400 Units by mouth daily.        Discharge Condition: Improved.  Diet recommendation: Low sodium, heart healthy.  Carbohydrate-modified.  Regular.   Disposition and Follow-up:  Discharge Instructions    De-access Port A Cath    Complete by:  As directed              ECOG PERFORMANCE STATUS:  Physical Exam at Discharge:  Subjective:  Patient seen and examined. She had insomnia during the night.  Tolerated chemo well. Denies fevers, chills, night sweats, vision changes, or mucositis. Denies shortness of breath or cough. Denies any chest pain or palpitations. Denies lower extremity swelling. Denies nausea, heartburn or change in bowel habits. Appetite is normal. Denies any dysuria. Denies abnormal skin rashes, or neuropathy. Denies any bleeding issues such as epistaxis, hematemesis,  hematuria or hematochezia. Ambulating without difficulty.  Objective:  BP 121/62 mmHg  Pulse 82  Temp(Src) 97.8 F (36.6 C) (Oral)  Resp 20  Ht 5' 6"  (1.676 m)  Wt 235 lb (106.595 kg)  BMI 37.95 kg/m2  SpO2 97%  LMP 02/10/1970 GENERAL:alert, no distress and comfortable SKIN: skin color, texture, turgor are normal, no rashes or significant lesions EYES: normal, conjunctiva are pink and non-injected, sclera clear OROPHARYNX:no exudate, no erythema and lips, buccal mucosa, and tongue normal  NECK: supple, thyroid normal size, non-tender, without nodularity LYMPH:  no palpable lymphadenopathy in the cervical, axillary or inguinal area LUNGS: clear to auscultation and percussion with normal breathing effort HEART: regular rate & rhythm and no murmurs and no lower extremity edema ABDOMEN:abdomen soft, non-tender and normal bowel sounds Musculoskeletal:no cyanosis of digits and no clubbing  PSYCH: alert & oriented x 3 with fluent speech NEURO: no focal motor/sensory deficits    Significant Diagnostic Studies:  Dg Chest Port 1 View  12/30/2014  CLINICAL DATA:  Shortness of breath. EXAM: PORTABLE CHEST 1 VIEW COMPARISON:  Chest radiograph 11/01/2014.  PET-CT 12/13/2014 FINDINGS: Tip of the right chest port in the SVC. Mild cardiomegaly is unchanged. Development of mild pulmonary edema. Question left pleural effusion. Ill-defined linear opacities at the lung bases, likely atelectasis. No pneumothorax. No confluent airspace disease. IMPRESSION: 1. Development of pulmonary edema.  Suspect left pleural effusion.  2. Stable mild cardiomegaly. Electronically Signed   By: Jeb Levering M.D.   On: 12/30/2014 05:57    Discharge Laboratory Values:  CBC  Recent Labs Lab 01/12/15 1336 01/17/15 0806  WBC 9.8 7.5  HGB 10.3* 10.7*  HCT 30.3* 32.0*  PLT 183 576*  MCV 91.0 91.7  MCH 30.9 30.7  MCHC 34.0 33.4  RDW 14.6* 15.2*  LYMPHSABS 1.6 1.4  MONOABS 1.6* 1.5*  EOSABS 0.1 0.0  BASOSABS 0.0 0.0    Anemia panel:  No results for input(s): VITAMINB12, FOLATE, FERRITIN, TIBC, IRON, RETICCTPCT in the last 72 hours.   Chemistries   Recent Labs Lab 01/12/15 1336 01/17/15 0806  NA 139 140  K 4.3 3.9  CO2 27 24  GLUCOSE 127 135  BUN 8.7 8.4  CREATININE 0.8 0.8  CALCIUM 9.3 9.4      Signed: Sharene Butters, PA-C 01/19/2015, 10:53 AM   Avon Molock, MD 01/19/2015

## 2015-01-20 MED ORDER — SODIUM CHLORIDE 0.9 % IV SOLN
10.0000 mg | Freq: Once | INTRAVENOUS | Status: AC
  Administered 2015-01-20: 10 mg via INTRAVENOUS
  Filled 2015-01-20: qty 1

## 2015-01-20 MED ORDER — SODIUM CHLORIDE 0.9 % IV SOLN
100.0000 mg/m2 | Freq: Once | INTRAVENOUS | Status: AC
  Administered 2015-01-20: 230 mg via INTRAVENOUS
  Filled 2015-01-20: qty 11.5

## 2015-01-20 NOTE — Progress Notes (Signed)
Chemo dosage for etoposide done with Clotilde Dieter RN.

## 2015-01-21 ENCOUNTER — Inpatient Hospital Stay
Admission: EM | Admit: 2015-01-21 | Discharge: 2015-01-22 | DRG: 291 | Disposition: A | Discharge status: Home Health | Payer: Medicare Other | Attending: Internal Medicine | Admitting: Internal Medicine

## 2015-01-21 ENCOUNTER — Emergency Department: Payer: Medicare Other

## 2015-01-21 ENCOUNTER — Inpatient Hospital Stay
Admit: 2015-01-21 | Discharge: 2015-01-21 | Disposition: A | Discharge status: Home / Self Care | Payer: Medicare Other | Attending: Internal Medicine | Admitting: Internal Medicine

## 2015-01-21 ENCOUNTER — Encounter: Payer: Self-pay | Admitting: Emergency Medicine

## 2015-01-21 DIAGNOSIS — Z882 Allergy status to sulfonamides status: Secondary | ICD-10-CM | POA: Diagnosis not present

## 2015-01-21 DIAGNOSIS — I2510 Atherosclerotic heart disease of native coronary artery without angina pectoris: Secondary | ICD-10-CM | POA: Diagnosis present

## 2015-01-21 DIAGNOSIS — J9811 Atelectasis: Secondary | ICD-10-CM | POA: Diagnosis present

## 2015-01-21 DIAGNOSIS — M1990 Unspecified osteoarthritis, unspecified site: Secondary | ICD-10-CM | POA: Diagnosis present

## 2015-01-21 DIAGNOSIS — J9601 Acute respiratory failure with hypoxia: Secondary | ICD-10-CM | POA: Diagnosis present

## 2015-01-21 DIAGNOSIS — C8510 Unspecified B-cell lymphoma, unspecified site: Secondary | ICD-10-CM | POA: Diagnosis present

## 2015-01-21 DIAGNOSIS — D649 Anemia, unspecified: Secondary | ICD-10-CM | POA: Diagnosis present

## 2015-01-21 DIAGNOSIS — Z79899 Other long term (current) drug therapy: Secondary | ICD-10-CM | POA: Diagnosis not present

## 2015-01-21 DIAGNOSIS — Z85828 Personal history of other malignant neoplasm of skin: Secondary | ICD-10-CM

## 2015-01-21 DIAGNOSIS — Z85118 Personal history of other malignant neoplasm of bronchus and lung: Secondary | ICD-10-CM

## 2015-01-21 DIAGNOSIS — E7800 Pure hypercholesterolemia, unspecified: Secondary | ICD-10-CM | POA: Diagnosis present

## 2015-01-21 DIAGNOSIS — I248 Other forms of acute ischemic heart disease: Secondary | ICD-10-CM | POA: Diagnosis present

## 2015-01-21 DIAGNOSIS — Z85038 Personal history of other malignant neoplasm of large intestine: Secondary | ICD-10-CM

## 2015-01-21 DIAGNOSIS — I739 Peripheral vascular disease, unspecified: Secondary | ICD-10-CM | POA: Diagnosis present

## 2015-01-21 DIAGNOSIS — R0600 Dyspnea, unspecified: Secondary | ICD-10-CM | POA: Diagnosis not present

## 2015-01-21 DIAGNOSIS — G629 Polyneuropathy, unspecified: Secondary | ICD-10-CM | POA: Diagnosis present

## 2015-01-21 DIAGNOSIS — G8929 Other chronic pain: Secondary | ICD-10-CM | POA: Diagnosis present

## 2015-01-21 DIAGNOSIS — R0682 Tachypnea, not elsewhere classified: Secondary | ICD-10-CM | POA: Diagnosis present

## 2015-01-21 DIAGNOSIS — F329 Major depressive disorder, single episode, unspecified: Secondary | ICD-10-CM | POA: Diagnosis present

## 2015-01-21 DIAGNOSIS — Z885 Allergy status to narcotic agent status: Secondary | ICD-10-CM | POA: Diagnosis not present

## 2015-01-21 DIAGNOSIS — Z6837 Body mass index (BMI) 37.0-37.9, adult: Secondary | ICD-10-CM | POA: Diagnosis not present

## 2015-01-21 DIAGNOSIS — J189 Pneumonia, unspecified organism: Secondary | ICD-10-CM | POA: Diagnosis not present

## 2015-01-21 DIAGNOSIS — F419 Anxiety disorder, unspecified: Secondary | ICD-10-CM | POA: Diagnosis present

## 2015-01-21 DIAGNOSIS — R0602 Shortness of breath: Secondary | ICD-10-CM | POA: Diagnosis not present

## 2015-01-21 DIAGNOSIS — Z880 Allergy status to penicillin: Secondary | ICD-10-CM | POA: Diagnosis not present

## 2015-01-21 DIAGNOSIS — Z888 Allergy status to other drugs, medicaments and biological substances status: Secondary | ICD-10-CM

## 2015-01-21 DIAGNOSIS — I5021 Acute systolic (congestive) heart failure: Secondary | ICD-10-CM | POA: Diagnosis present

## 2015-01-21 DIAGNOSIS — E669 Obesity, unspecified: Secondary | ICD-10-CM | POA: Diagnosis present

## 2015-01-21 DIAGNOSIS — I509 Heart failure, unspecified: Secondary | ICD-10-CM

## 2015-01-21 DIAGNOSIS — E039 Hypothyroidism, unspecified: Secondary | ICD-10-CM | POA: Diagnosis present

## 2015-01-21 DIAGNOSIS — R000 Tachycardia, unspecified: Secondary | ICD-10-CM | POA: Diagnosis present

## 2015-01-21 DIAGNOSIS — R011 Cardiac murmur, unspecified: Secondary | ICD-10-CM | POA: Diagnosis present

## 2015-01-21 DIAGNOSIS — Z86010 Personal history of colonic polyps: Secondary | ICD-10-CM | POA: Diagnosis not present

## 2015-01-21 DIAGNOSIS — F411 Generalized anxiety disorder: Secondary | ICD-10-CM | POA: Diagnosis present

## 2015-01-21 DIAGNOSIS — I110 Hypertensive heart disease with heart failure: Secondary | ICD-10-CM | POA: Diagnosis not present

## 2015-01-21 DIAGNOSIS — Z881 Allergy status to other antibiotic agents status: Secondary | ICD-10-CM | POA: Diagnosis not present

## 2015-01-21 DIAGNOSIS — I5031 Acute diastolic (congestive) heart failure: Secondary | ICD-10-CM | POA: Diagnosis not present

## 2015-01-21 DIAGNOSIS — C8330 Diffuse large B-cell lymphoma, unspecified site: Secondary | ICD-10-CM | POA: Diagnosis not present

## 2015-01-21 DIAGNOSIS — K219 Gastro-esophageal reflux disease without esophagitis: Secondary | ICD-10-CM | POA: Diagnosis present

## 2015-01-21 DIAGNOSIS — R748 Abnormal levels of other serum enzymes: Secondary | ICD-10-CM | POA: Diagnosis not present

## 2015-01-21 DIAGNOSIS — I10 Essential (primary) hypertension: Secondary | ICD-10-CM | POA: Diagnosis not present

## 2015-01-21 DIAGNOSIS — J9690 Respiratory failure, unspecified, unspecified whether with hypoxia or hypercapnia: Secondary | ICD-10-CM | POA: Diagnosis not present

## 2015-01-21 DIAGNOSIS — J810 Acute pulmonary edema: Secondary | ICD-10-CM | POA: Diagnosis not present

## 2015-01-21 LAB — TROPONIN I
TROPONIN I: 0.06 ng/mL — AB (ref <0.031)
TROPONIN I: 0.16 ng/mL — AB (ref <0.031)
TROPONIN I: 0.38 ng/mL — AB (ref <0.031)
Troponin I: 0.2 ng/mL — ABNORMAL HIGH (ref <0.031)

## 2015-01-21 LAB — CBC WITH DIFFERENTIAL/PLATELET
BASOS PCT: 0 %
Basophils Absolute: 0 10*3/uL (ref 0–0.1)
Eosinophils Absolute: 0 10*3/uL (ref 0–0.7)
Eosinophils Relative: 0 %
HEMATOCRIT: 35.2 % (ref 35.0–47.0)
Hemoglobin: 11.8 g/dL — ABNORMAL LOW (ref 12.0–16.0)
LYMPHS PCT: 30 %
Lymphs Abs: 2.6 10*3/uL (ref 1.0–3.6)
MCH: 31.8 pg (ref 26.0–34.0)
MCHC: 33.4 g/dL (ref 32.0–36.0)
MCV: 95.2 fL (ref 80.0–100.0)
MONO ABS: 0.5 10*3/uL (ref 0.2–0.9)
MONOS PCT: 5 %
NEUTROS ABS: 5.5 10*3/uL (ref 1.4–6.5)
Neutrophils Relative %: 65 %
Platelets: 601 10*3/uL — ABNORMAL HIGH (ref 150–440)
RBC: 3.7 MIL/uL — ABNORMAL LOW (ref 3.80–5.20)
RDW: 14.9 % — AB (ref 11.5–14.5)
WBC: 8.7 10*3/uL (ref 3.6–11.0)

## 2015-01-21 LAB — COMPREHENSIVE METABOLIC PANEL
ALBUMIN: 4.3 g/dL (ref 3.5–5.0)
ALK PHOS: 169 U/L — AB (ref 38–126)
ALT: 72 U/L — ABNORMAL HIGH (ref 14–54)
AST: 73 U/L — ABNORMAL HIGH (ref 15–41)
Anion gap: 7 (ref 5–15)
BUN: 13 mg/dL (ref 6–20)
CALCIUM: 9 mg/dL (ref 8.9–10.3)
CO2: 22 mmol/L (ref 22–32)
Chloride: 110 mmol/L (ref 101–111)
Creatinine, Ser: 0.67 mg/dL (ref 0.44–1.00)
GFR calc Af Amer: >60 mL/min (ref >60)
GFR calc non Af Amer: >60 mL/min (ref >60)
GLUCOSE: 136 mg/dL — AB (ref 65–99)
Potassium: 3.9 mmol/L (ref 3.5–5.1)
SODIUM: 139 mmol/L (ref 135–145)
Total Bilirubin: 0.7 mg/dL (ref 0.3–1.2)
Total Protein: 7.1 g/dL (ref 6.5–8.1)

## 2015-01-21 LAB — BRAIN NATRIURETIC PEPTIDE: B Natriuretic Peptide: 514 pg/mL — ABNORMAL HIGH (ref 0.0–100.0)

## 2015-01-21 LAB — LACTIC ACID, PLASMA
Lactic Acid, Venous: 3.1 mmol/L (ref 0.5–2.0)
Lactic Acid, Venous: 2.5 mmol/L (ref 0.5–2.0)

## 2015-01-21 LAB — MRSA PCR SCREENING: MRSA by PCR: NEGATIVE

## 2015-01-21 LAB — APTT

## 2015-01-21 LAB — FIBRIN DERIVATIVES D-DIMER (ARMC ONLY): Fibrin derivatives D-dimer (ARMC): 1161 — ABNORMAL HIGH (ref 0–499)

## 2015-01-21 MED ORDER — LORATADINE 10 MG PO TABS
10.0000 mg | ORAL_TABLET | Freq: Every day | ORAL | Status: DC
  Administered 2015-01-21 – 2015-01-22 (×2): 10 mg via ORAL
  Filled 2015-01-21 (×2): qty 1

## 2015-01-21 MED ORDER — ACYCLOVIR 200 MG PO CAPS
400.0000 mg | ORAL_CAPSULE | Freq: Every day | ORAL | Status: DC
  Administered 2015-01-21 – 2015-01-22 (×2): 400 mg via ORAL
  Filled 2015-01-21 (×2): qty 2

## 2015-01-21 MED ORDER — DOCUSATE SODIUM 100 MG PO CAPS
100.0000 mg | ORAL_CAPSULE | Freq: Every day | ORAL | Status: DC
  Administered 2015-01-21: 100 mg via ORAL
  Filled 2015-01-21: qty 1

## 2015-01-21 MED ORDER — LEVOTHYROXINE SODIUM 112 MCG PO TABS
112.0000 ug | ORAL_TABLET | Freq: Every day | ORAL | Status: DC
  Administered 2015-01-22: 112 ug via ORAL
  Filled 2015-01-21: qty 1

## 2015-01-21 MED ORDER — DIPHENHYDRAMINE HCL 25 MG PO CAPS
25.0000 mg | ORAL_CAPSULE | Freq: Once | ORAL | Status: DC
  Filled 2015-01-21: qty 1

## 2015-01-21 MED ORDER — HEPARIN BOLUS VIA INFUSION
4000.0000 [IU] | Freq: Once | INTRAVENOUS | Status: AC
  Administered 2015-01-21: 4000 [IU] via INTRAVENOUS
  Filled 2015-01-21: qty 4000

## 2015-01-21 MED ORDER — ONDANSETRON HCL 4 MG/2ML IJ SOLN: 4.0000 mg | Freq: Four times a day (QID) | INTRAMUSCULAR | Status: DC | PRN

## 2015-01-21 MED ORDER — ONDANSETRON HCL 4 MG PO TABS: 4.0000 mg | ORAL_TABLET | Freq: Four times a day (QID) | ORAL | Status: DC | PRN

## 2015-01-21 MED ORDER — VITAMIN E 180 MG (400 UNIT) PO CAPS
400.0000 [IU] | ORAL_CAPSULE | Freq: Every day | ORAL | Status: DC
  Administered 2015-01-22: 400 [IU] via ORAL
  Filled 2015-01-21 (×2): qty 1

## 2015-01-21 MED ORDER — FUROSEMIDE 10 MG/ML IJ SOLN
40.0000 mg | Freq: Once | INTRAMUSCULAR | Status: AC
  Administered 2015-01-21: 40 mg via INTRAVENOUS
  Filled 2015-01-21: qty 4

## 2015-01-21 MED ORDER — PSYLLIUM 95 % PO PACK
1.0000 | PACK | Freq: Every day | ORAL | Status: DC
  Filled 2015-01-21: qty 1

## 2015-01-21 MED ORDER — VITAMIN D 1000 UNITS PO TABS
1000.0000 [IU] | ORAL_TABLET | Freq: Every day | ORAL | Status: DC
  Administered 2015-01-21 – 2015-01-22 (×2): 1000 [IU] via ORAL
  Filled 2015-01-21 (×2): qty 1

## 2015-01-21 MED ORDER — NITROGLYCERIN 0.4 MG SL SUBL
SUBLINGUAL_TABLET | SUBLINGUAL | Status: AC
  Administered 2015-01-21: 0.4 mg via SUBLINGUAL
  Filled 2015-01-21: qty 3

## 2015-01-21 MED ORDER — SIMVASTATIN 40 MG PO TABS
40.0000 mg | ORAL_TABLET | Freq: Every day | ORAL | Status: DC
  Administered 2015-01-21: 40 mg via ORAL
  Filled 2015-01-21: qty 1

## 2015-01-21 MED ORDER — LIDOCAINE-PRILOCAINE 2.5-2.5 % EX CREA
1.0000 "application " | TOPICAL_CREAM | CUTANEOUS | Status: DC | PRN
  Filled 2015-01-21: qty 5

## 2015-01-21 MED ORDER — NITROGLYCERIN 2 % TD OINT
1.0000 [in_us] | TOPICAL_OINTMENT | Freq: Once | TRANSDERMAL | Status: AC
  Administered 2015-01-21: 1 [in_us] via TOPICAL

## 2015-01-21 MED ORDER — NITROGLYCERIN 2 % TD OINT
TOPICAL_OINTMENT | TRANSDERMAL | Status: AC
  Filled 2015-01-21: qty 1

## 2015-01-21 MED ORDER — GABAPENTIN 300 MG PO CAPS
300.0000 mg | ORAL_CAPSULE | Freq: Two times a day (BID) | ORAL | Status: DC
  Administered 2015-01-21 – 2015-01-22 (×3): 300 mg via ORAL
  Filled 2015-01-21 (×3): qty 1

## 2015-01-21 MED ORDER — ASPIRIN 81 MG PO CHEW
324.0000 mg | CHEWABLE_TABLET | Freq: Once | ORAL | Status: AC
  Administered 2015-01-21: 324 mg via ORAL
  Filled 2015-01-21: qty 4

## 2015-01-21 MED ORDER — OMEGA-3-ACID ETHYL ESTERS 1 G PO CAPS
1.0000 g | ORAL_CAPSULE | Freq: Every day | ORAL | Status: DC
  Administered 2015-01-21 – 2015-01-22 (×2): 1 g via ORAL
  Filled 2015-01-21 (×2): qty 1

## 2015-01-21 MED ORDER — CALCIUM CARBONATE-VITAMIN D 600-400 MG-UNIT PO TABS: 1.0000 | ORAL_TABLET | Freq: Every evening | ORAL | Status: DC

## 2015-01-21 MED ORDER — HEPARIN (PORCINE) IN NACL 100-0.45 UNIT/ML-% IJ SOLN
1000.0000 [IU]/h | INTRAMUSCULAR | Status: DC
  Administered 2015-01-21: 1000 [IU]/h via INTRAVENOUS
  Filled 2015-01-21: qty 250

## 2015-01-21 MED ORDER — IOHEXOL 350 MG/ML SOLN
100.0000 mL | Freq: Once | INTRAVENOUS | Status: AC | PRN
  Administered 2015-01-21: 100 mL via INTRAVENOUS

## 2015-01-21 MED ORDER — ESTRADIOL 1 MG PO TABS
1.0000 mg | ORAL_TABLET | Freq: Every day | ORAL | Status: DC
  Administered 2015-01-21 – 2015-01-22 (×2): 1 mg via ORAL
  Filled 2015-01-21 (×2): qty 1

## 2015-01-21 MED ORDER — POLYETHYLENE GLYCOL 3350 17 G PO PACK
17.0000 g | PACK | Freq: Every day | ORAL | Status: DC
  Filled 2015-01-21: qty 1

## 2015-01-21 MED ORDER — ACETAMINOPHEN 650 MG RE SUPP: 650.0000 mg | Freq: Four times a day (QID) | RECTAL | Status: DC | PRN

## 2015-01-21 MED ORDER — ACETAMINOPHEN 325 MG PO TABS
650.0000 mg | ORAL_TABLET | Freq: Four times a day (QID) | ORAL | Status: DC | PRN
  Administered 2015-01-21 – 2015-01-22 (×2): 650 mg via ORAL
  Filled 2015-01-21 (×2): qty 2

## 2015-01-21 MED ORDER — HEPARIN SODIUM (PORCINE) 5000 UNIT/ML IJ SOLN
INTRAMUSCULAR | Status: AC
Start: 2015-01-21 — End: 2015-01-21
  Filled 2015-01-21: qty 1

## 2015-01-21 MED ORDER — CALCIUM CARBONATE-VITAMIN D 500-200 MG-UNIT PO TABS
1.0000 | ORAL_TABLET | Freq: Every evening | ORAL | Status: DC
  Filled 2015-01-21: qty 1

## 2015-01-21 MED ORDER — ACYCLOVIR 800 MG PO TABS
400.0000 mg | ORAL_TABLET | Freq: Every day | ORAL | Status: DC
  Filled 2015-01-21: qty 1

## 2015-01-21 MED ORDER — ONDANSETRON HCL 4 MG/2ML IJ SOLN
INTRAMUSCULAR | Status: AC
Start: 2015-01-21 — End: 2015-01-21
  Administered 2015-01-21: 4 mg via INTRAVENOUS
  Filled 2015-01-21: qty 2

## 2015-01-21 MED ORDER — PSYLLIUM 58.6 % PO PACK: 1.0000 | PACK | Freq: Every day | ORAL | Status: DC

## 2015-01-21 MED ORDER — OMEGA-3 350 MG PO CAPS: 1.0000 | ORAL_CAPSULE | Freq: Every day | ORAL | Status: DC

## 2015-01-21 MED ORDER — DIPHENHYDRAMINE HCL 25 MG PO CAPS
25.0000 mg | ORAL_CAPSULE | Freq: Once | ORAL | Status: AC
  Administered 2015-01-21: 25 mg via ORAL

## 2015-01-21 MED ORDER — PANTOPRAZOLE SODIUM 40 MG PO TBEC
40.0000 mg | DELAYED_RELEASE_TABLET | Freq: Every day | ORAL | Status: DC
  Administered 2015-01-21 – 2015-01-22 (×2): 40 mg via ORAL
  Filled 2015-01-21 (×2): qty 1

## 2015-01-21 MED ORDER — FLUTICASONE PROPIONATE 50 MCG/ACT NA SUSP
2.0000 | Freq: Every day | NASAL | Status: DC
  Administered 2015-01-21 – 2015-01-22 (×2): 2 via NASAL
  Filled 2015-01-21: qty 16

## 2015-01-21 MED ORDER — SODIUM CHLORIDE 0.9 % IJ SOLN
3.0000 mL | Freq: Two times a day (BID) | INTRAMUSCULAR | Status: DC
  Administered 2015-01-21 – 2015-01-22 (×3): 3 mL via INTRAVENOUS

## 2015-01-21 MED ORDER — NITROGLYCERIN 0.4 MG SL SUBL
0.4000 mg | SUBLINGUAL_TABLET | SUBLINGUAL | Status: DC | PRN
  Administered 2015-01-21 (×3): 0.4 mg via SUBLINGUAL

## 2015-01-21 MED ORDER — ENOXAPARIN SODIUM 40 MG/0.4ML ~~LOC~~ SOLN
40.0000 mg | SUBCUTANEOUS | Status: DC
  Administered 2015-01-21 – 2015-01-22 (×2): 40 mg via SUBCUTANEOUS
  Filled 2015-01-21 (×2): qty 0.4

## 2015-01-21 MED ORDER — FUROSEMIDE 10 MG/ML IJ SOLN
40.0000 mg | Freq: Every day | INTRAMUSCULAR | Status: DC
  Administered 2015-01-21 – 2015-01-22 (×2): 40 mg via INTRAVENOUS
  Filled 2015-01-21 (×2): qty 4

## 2015-01-21 MED ORDER — VITAMIN D-3 25 MCG (1000 UT) PO CAPS: 1000.0000 [IU] | ORAL_CAPSULE | Freq: Every day | ORAL | Status: DC

## 2015-01-21 MED ORDER — ONDANSETRON HCL 4 MG/2ML IJ SOLN
4.0000 mg | Freq: Once | INTRAMUSCULAR | Status: AC
  Administered 2015-01-21: 4 mg via INTRAVENOUS

## 2015-01-21 MED ORDER — FLUOXETINE HCL 20 MG PO CAPS
20.0000 mg | ORAL_CAPSULE | Freq: Every day | ORAL | Status: DC
  Administered 2015-01-21 – 2015-01-22 (×2): 20 mg via ORAL
  Filled 2015-01-21 (×2): qty 1

## 2015-01-21 NOTE — Progress Notes (Signed)
ANTICOAGULATION CONSULT NOTE - Initial Consult  Pharmacy Consult for heparin Indication: chest pain/ACS  Allergies  Allergen Reactions  . Achromycin [Tetracycline Hcl] Other (See Comments)    Pt does not remember reaction  . Allopurinol Other (See Comments)    REACTION: Unsure of reaction happene years ago  . Astelin [Azelastine Hcl] Other (See Comments)    Reaction unknown  . Cephalexin Other (See Comments)    REACTION: unsure of reaction happened yrs ago.  . Codeine Other (See Comments)    REACTION: abd. pain  . Meloxicam Other (See Comments)    REACTION: GI symptoms  . Minocycline Other (See Comments)    Abdominal pain  . Nabumetone Other (See Comments)    REACTION: reaction not known  . Nyquil [Pseudoeph-Doxylamine-Dm-Apap] Hives  . Penicillins Other (See Comments)    Reaction unknown Has patient had a PCN reaction causing immediate rash, facial/tongue/throat swelling, SOB or lightheadedness with hypotension: unsure reaction unknown Has patient had a PCN reaction causing severe rash involving mucus membranes or skin necrosis: unsure reaction unknown Has patient had a PCN reaction that required hospitalization no Has patient had a PCN reaction occurring within the last 10 years: no If all of the above answers are "NO", then may proceed with Cephalosporin use.  . Sulfa Antibiotics Other (See Comments)    Gi side eff   . Zolpidem Tartrate Other (See Comments)    REACTION: feels too drugged  . Buspar [Buspirone Hcl] Other (See Comments)    Dizziness, and not as effective for anxiety  . Ciprofloxacin Rash    Patient Measurements: Height: '5\' 6"'$  (167.6 cm) Weight: 235 lb (106.595 kg) IBW/kg (Calculated) : 59.3 Heparin Dosing Weight: 83.9 kg  Vital Signs: Temp: 98 F (36.7 C) (12/11 0303) Temp Source: Axillary (12/11 0303) BP: 122/83 mmHg (12/11 0500) Pulse Rate: 96 (12/11 0500)  Labs:  Recent Labs  01/21/15 0310  HGB 11.8*  HCT 35.2  PLT 601*  CREATININE 0.67   TROPONINI 0.06*    Estimated Creatinine Clearance: 75 mL/min (by C-G formula based on Cr of 0.67).   Medical History: Past Medical History  Diagnosis Date  . Colon polyps   . Depression   . Hypothyroidism   . Osteoarthritis     hands  . Peripheral vascular disease ([place])   . Degenerative disk disease     spine in center in past   . 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 Cancer ([place])     skin cancer- basal cell on arm / colon 2011  . Lymphoma ([place])   . Colon cancer ([place])     08-2009  . Neuropathy due to chemotherapeutic drug ([place]) 06/19/2014  . Cancer ([place])     colon CA and lung CA  . Heart murmur   . GERD (gastroesophageal reflux disease)   . Anemia     hx blood tx  . Pneumonia   . 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 Hypertension     Medications:  Infusions:  . heparin      Assessment: 74 yof cc respiratory distress/severe hypoxia. History of cancer (colon?) and recent chemotherapy. Starting UFH for chest pain/ACS.  Goal of Therapy:  Heparin level 0.3-0.7 units/ml Monitor platelets by anticoagulation protocol: Yes   Plan:  Give 4000 units bolus x 1 Start heparin infusion at 1000 units/hr Check anti-Xa level in 8 hours and daily while on heparin Continue to monitor H&H and platelets  Laural Benes, Pharm.D.,  BCPS Clinical Pharmacist 01/21/2015,5:35 AM

## 2015-01-21 NOTE — ED Notes (Signed)
Pt with improved work of breathing. Skin less dry at this time. Spouse at bedside. Admission procedure discussed with spouse and pt who verbalize understanding.

## 2015-01-21 NOTE — ED Notes (Signed)
Pt with improved resp status. Pt able to speak in full sentences, skin pwd. Pt denies pain.

## 2015-01-21 NOTE — ED Notes (Signed)
Report to denia, rn.

## 2015-01-21 NOTE — ED Notes (Signed)
ntg paste removed at 0340 prior to sublingual ntg

## 2015-01-21 NOTE — ED Notes (Signed)
Critical lactic acid called from lab of 2.5. Dr. Reita Cliche notified.

## 2015-01-21 NOTE — ED Notes (Addendum)
Antonieta Pert, rn verified heparin bolus and drip level

## 2015-01-21 NOTE — ED Notes (Signed)
Pt with resp distress from home. Pt with bipap in place, bilateral lower extremities. Rales noted. Pt with tachypnea, unable to speak in full sentences.

## 2015-01-21 NOTE — ED Notes (Signed)
Fingers no longer cyanotic. Pt readjusted in bed for comfort. Foley draining clear yellow urine. hospitalist in to see pt.

## 2015-01-21 NOTE — ED Notes (Signed)
Pt to ct scan with rn assist.

## 2015-01-21 NOTE — Evaluation (Signed)
Physical Therapy Evaluation Patient Details Name: Diamond Boyd MRN: [id number] DOB: 07/25/1940 Today's Date: 01/21/2015   History of Present Illness  Pt here with CHF and general weakness with some nausea/vomiting after having chemo yesterday.  Clinical Impression  Pt transferred from CCU, feeling much better.  She does not normally need AD, but is unsteady w/o it today (safe with AD for 75 ft of ambulation)  She is able to ambulate w/o O2, but her numbers drop from high 90s (on 2 liters) to 92% with PT exam on room air.  She displays functional strength and good mobility, but is unsteady standing w/o AD at this time.  Pt will benefit from HHPT on d/c.     Follow Up Recommendations Home health PT    Equipment Recommendations       Recommendations for Other Services       Precautions / Restrictions Precautions Precautions: Fall Restrictions Weight Bearing Restrictions: No      Mobility  Bed Mobility Overal bed mobility: Independent                Transfers Overall transfer level: Modified independent Equipment used: Rolling walker (2 wheeled)             General transfer comment: attempted to get to standing w/o AD (using light HHA) and pt was unsteady and unable to maintain balance w/o considerable assist.  Pt able to easily get to standing w/ assist of a walker  Ambulation/Gait Ambulation/Gait assistance: Min guard Ambulation Distance (Feet): 75 Feet Assistive device: Rolling walker (2 wheeled)       General Gait Details: Initial attempts to ambulate w/o AD were unsafe, but pt does well ambulating with walker and shows good confidence and safety with the effort.   Stairs            Wheelchair Mobility    Modified Rankin (Stroke Patients Only)       Balance Overall balance assessment: Needs assistance             Standing balance comment: Pt able to maintain balance with FWW w/o issue, unsteady with HHA only                              Pertinent Vitals/Pain Pain Assessment: No/denies pain    Home Living Family/patient expects to be discharged to:: Private residence Living Arrangements: Spouse/significant other   Type of Home: House Home Access: Stairs to enter   [employer] of Steps: 3   Home Equipment: Walker - 4 wheels;Walker - 2 wheels;Cane - single point      Prior Function Level of Independence: Independent         Comments: Pt does not use O2 home, generally does not need an AD     Hand Dominance        Extremity/Trunk Assessment   Upper Extremity Assessment: Overall WFL for tasks assessed           Lower Extremity Assessment: Overall WFL for tasks assessed (functional, though reports being weaker than baseline)         Communication   Communication: No difficulties  Cognition Arousal/Alertness: Awake/alert Behavior During Therapy: WFL for tasks assessed/performed Overall Cognitive Status: Within Functional Limits for tasks assessed                      General Comments      Exercises  Assessment/Plan    PT Assessment Patient needs continued PT services  PT Diagnosis Difficulty walking;Generalized weakness   PT Problem List Decreased strength;Decreased activity tolerance;Decreased balance;Decreased knowledge of use of DME;Decreased mobility  PT Treatment Interventions Gait training;DME instruction;Stair training;Functional mobility training;Therapeutic activities;Therapeutic exercise;Balance training   PT Goals (Current goals can be found in the Care Plan section) Acute Rehab PT Goals Patient Stated Goal: I want to go home PT Goal Formulation: With patient Time For Goal Achievement: 02/04/15 Potential to Achieve Goals: Good    Frequency Min 2X/week   Barriers to discharge        Co-evaluation               End of Session Equipment Utilized During Treatment: Gait belt Activity Tolerance: Patient limited by  fatigue Patient left: with chair alarm set;with family/visitor present           Time: 0962-8366 PT Time Calculation (min) (ACUTE ONLY): 21 min   Charges:   PT Evaluation $Initial PT Evaluation Tier I: 1 Procedure     PT G Codes:       Wayne Both, PT, DPT [phone]  Kreg Shropshire 01/21/2015, 5:03 PM

## 2015-01-21 NOTE — Progress Notes (Signed)
bipap restarted 10/5 35%

## 2015-01-21 NOTE — Progress Notes (Signed)
Patient stated she has not not been sleeping  for about 4 days  and needs Benadryl 50 mg to be able to sleep tonight. Dr. Seth Bake. Notified with a new order for Benadryl 25 mg oral x 1 dose and give another dose of 25 mg if the 1st dose does not work.

## 2015-01-21 NOTE — Progress Notes (Signed)
Transported patient on bipap to ct scan. Able to decrease pressure to 10/5 35%. Patient's breathing much more improved with bipap and diuretics. Saturation noted 100% remaining vitals stable. No difficulties in ct. Patient weaned to 3liter nasal cannula. Patient states she is feeling better. Vitals stable. bipap on standby.

## 2015-01-21 NOTE — ED Notes (Signed)
Critical lactic 3.1 dr. Reita Cliche notified

## 2015-01-21 NOTE — ED Notes (Signed)
Critical troponin of 0.06 called from lab. Dr. Reita Cliche notified.

## 2015-01-21 NOTE — ED Provider Notes (Addendum)
[facility] Emergency Department Provider Note   ____________________________________________  Time seen: on ems arrival I have reviewed the triage vital signs and the triage nursing note.  HISTORY  Chief Complaint Respiratory Distress   Historian Limited from Patient, respiratory distress,on bipap From ems  HPI Diamond Boyd is a 74 y.o. female with hx of ?colon? Cancer, arrives from home with somewhat acute sob.  Severe.  Hypoxic86% room air per ems at home.  Placed on bipap with resultant improved o2 sat.  +LE edema, unclear chronicit.  No reported hx of cardiac disease or chf.  Hypertensive.  No chest pain.  No cough or fever reported.   Additional history obtained from husband who arrived later. She was initially discharged from the hospital after several days receiving chemotherapy. Symptoms started a few hours prior to arrival abruptly.   Past Medical History  Diagnosis Date  . Colon polyps   . Depression   . Hypothyroidism   . Osteoarthritis     hands  . Peripheral vascular disease ([place])   . Degenerative disk disease     spine in center in past   . 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 Cancer ([place])     skin cancer- basal cell on arm / colon 2011  . Lymphoma ([place])   . Colon cancer ([place])     08-2009  . 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 Cancer ([place])     colon CA and lung CA  . Heart murmur   . GERD (gastroesophageal reflux disease)   . Anemia     hx blood tx  . Pneumonia   . 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 Hypertension     Patient Active Problem List   Diagnosis Date Noted  . Fever, unspecified 01/10/2015  . Immunocompromised status associated with infection ([place]) 01/08/2015  . Grade 3b follicular lymphoma of lymph nodes of multiple regions ([place]) 12/28/2014  . 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 Preventive  measure 11/14/2014  . Parotid mass 11/14/2014  . Chronic lower back pain 09/28/2014  . Mediastinal lymphadenopathy 09/27/2014  . 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 Peripheral neuropathy due to chemotherapy ([place]) 04/03/2014  . Trochanteric bursitis of right hip 04/03/2014  . Anemia in chronic illness 02/21/2014  . Elevated liver enzymes 02/21/2014  . Urinary tract infection 11/18/2013  . Candidal intertrigo 09/23/2013  . Atrophic vaginitis 09/23/2013  . Drug induced neutropenia(288.03) 09/01/2013  . History of colon cancer 06/17/2013  . History of hodgkin's lymphoma 05/20/2013  . Dysuria 05/20/2013  . Conjunctivitis, acute 04/27/2013  . Cold sore 04/27/2013  . Left temporal headache 03/11/2013  . Hyperglycemia 03/11/2013  . Acute maxillary sinusitis 02/09/2013  . Other malaise and fatigue 01/04/2013  . Herpes zoster 11/22/2012  . Vaginal pain 11/22/2012  . Cystocele 09/22/2012  . Encounter for Medicare annual wellness exam 09/17/2012  . Left knee pain 09/10/2012  . Thoracic back pain 06/01/2012  . Skin lesion of face 05/17/2012  . Other screening mammogram 07/22/2011  . Routine gynecological examination 07/22/2011  . Colon cancer screening 07/22/2011  . History of anemia 05/06/2011  . 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 Hypothyroid 02/24/2011  . Varicose veins 02/24/2011  . Elevated blood pressure 11/20/2010  . Obesity 11/20/2010  . 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 Other chronic sinusitis 07/03/2009  . THROAT PAIN, CHRONIC 07/03/2009  .  GERD 12/20/2008  . Anxiety state, unspecified 08/22/2008  . MENOPAUSAL SYNDROME 07/13/2007  . HYPERCHOLESTEROLEMIA, PURE 07/08/2006  . Allergic rhinitis 07/08/2006  . OVERACTIVE BLADDER 07/08/2006  . 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 DEPRESSION 06/25/2006  . PERIPHERAL VASCULAR DISEASE 06/25/2006  . OSTEOARTHRITIS 06/25/2006  . LEG EDEMA 06/25/2006  . SKIN CANCER, HX OF 06/25/2006  .  COLONIC POLYPS, HX OF 06/25/2006    Past Surgical History  Procedure Laterality Date  . Cholecystectomy    . Splenectomy      lymphoma  . Breast biopsy  1996  . Plantar fascia release    . Ventral hernia repair  1998  . Tendon release      Right thumb   . Colectomy  8/11  . Vaginal hysterectomy    . Bone marrow biopsy  05/27/13  . Cataract extraction, bilateral  2016  . Port-a-cath insertion    . Colonoscopy w/ biopsies    . Dg biopsy lung    . Video bronchoscopy with endobronchial ultrasound N/A 10/17/2014    Procedure: VIDEO BRONCHOSCOPY WITH ENDOBRONCHIAL ULTRASOUND;  Surgeon: Javier Glazier, MD;  Location: [place];  Service: Thoracic;  Laterality: N/A;  . Video bronchoscopy with endobronchial ultrasound N/A 11/01/2014    Procedure: VIDEO BRONCHOSCOPY WITH ENDOBRONCHIAL ULTRASOUND;  Surgeon: Grace Isaac, MD;  Location: [place];  Service: Thoracic;  Laterality: N/A;  . Mediastinoscopy N/A 11/01/2014    Procedure: MEDIASTINOSCOPY;  Surgeon: Grace Isaac, MD;  Location: [place];  Service: Thoracic;  Laterality: N/A;  . Lymph node biopsy N/A 11/01/2014    Procedure: MEDIASTINAL LYMPH NODE BIOPSY;  Surgeon: Grace Isaac, MD;  Location: [place];  Service: Thoracic;  Laterality: N/A;    Current Outpatient Rx  Name  Route  Sig  Dispense  Refill  . acetaminophen (TYLENOL) 650 MG CR tablet   Oral   Take 650 mg by mouth every 8 (eight) hours as needed (For back pain.).         Marland Kitchen acyclovir (ZOVIRAX) 400 MG tablet   Oral   Take 400 mg by mouth daily.         . Calcium Carbonate-Vitamin D (CALCIUM 600+D) 600-400 MG-UNIT per tablet   Oral   Take 1 tablet by mouth every evening.          . celecoxib (CELEBREX) 200 MG capsule   Oral   Take 200 mg by mouth 2 (two) times daily.         . Cholecalciferol (VITAMIN D-3) 1000 UNITS CAPS   Oral   Take 1,000 Units by mouth daily.         Marland Kitchen docusate sodium (COLACE) 100 MG capsule   Oral   Take 100 mg by mouth at  bedtime.         Marland Kitchen estradiol (ESTRACE) 1 MG tablet   Oral   Take 1 mg by mouth daily.         Marland Kitchen FLUoxetine (PROZAC) 20 MG capsule   Oral   Take 20 mg by mouth daily.         . fluticasone (FLONASE) 50 MCG/ACT nasal spray   Each Nare   Place 2 sprays into both nostrils daily.         Marland Kitchen gabapentin (NEURONTIN) 300 MG capsule   Oral   Take 300 mg by mouth 2 (two) times daily.         Marland Kitchen levothyroxine (SYNTHROID, LEVOTHROID) 112 MCG tablet   Oral  Take 112 mcg by mouth daily before breakfast.         . lidocaine-prilocaine (EMLA) cream   Topical   Apply 1 application topically as needed (For port-a-cath.).         Marland Kitchen loratadine (CLARITIN) 10 MG tablet   Oral   Take 10 mg by mouth daily.         . Omega-3 350 MG CAPS   Oral   Take 1 capsule by mouth daily.         Marland Kitchen omeprazole (PRILOSEC) 40 MG capsule   Oral   Take 40 mg by mouth daily.         . ondansetron (ZOFRAN) 8 MG tablet   Oral   Take 8 mg by mouth every 6 (six) hours as needed for nausea or vomiting.          . polyethylene glycol (MIRALAX / GLYCOLAX) packet   Oral   Take 17 g by mouth at bedtime.         . prochlorperazine (COMPAZINE) 10 MG tablet   Oral   Take 10 mg by mouth every 6 (six) hours as needed for nausea or vomiting.          . psyllium (METAMUCIL) 58.6 % packet   Oral   Take 1-3 packets by mouth at bedtime.          . simvastatin (ZOCOR) 40 MG tablet   Oral   Take 40 mg by mouth at bedtime.         . vitamin E (VITAMIN E) 400 UNIT capsule   Oral   Take 400 Units by mouth daily.            Allergies Achromycin; Allopurinol; Astelin; Cephalexin; Codeine; Meloxicam; Minocycline; Nabumetone; Nyquil; Penicillins; Sulfa antibiotics; Zolpidem tartrate; Buspar; and Ciprofloxacin  Family History  Problem Relation Age of Onset  . Coronary artery disease Mother   . Alcohol abuse Mother   . Diabetes Mother   . Cancer Father     brain  . Diabetes Brother   .  Fibromyalgia Daughter     chronic pain   . COPD Daughter   . Colon cancer Neg Hx   . Colon polyps Neg Hx   . Stomach cancer Neg Hx   . Rectal cancer Neg Hx   . Ulcerative colitis Neg Hx   . Crohn's disease Neg Hx   . Asthma Daughter   . Rheum arthritis Brother   . Clotting disorder Brother     Social History Social History  Substance Use Topics  . Smoking status: Never Smoker   . Smokeless tobacco: Never Used     Comment: Second-hand exposure through father.  . Alcohol Use: No   been married to her husband for near 38 years  Review of Systems Limited historian, critical illness Constitutional: Negative for fever. Eyes:  ENT: Cardiovascular: Negative for chest pain. Respiratory: Positive for shortness of breath. Gastrointestinal: Genitourinary:  Musculoskeletal:  Skin: Negative for rash. Neurological: Negative for headache. 10 point Review of Systems otherwise negative ____________________________________________   PHYSICAL EXAM:  VITAL SIGNS: ED Triage Vitals  Enc Vitals Group     BP 01/21/15 0303 196/115 mmHg     Pulse Rate 01/21/15 0303 140     Resp 01/21/15 0303 36     Temp 01/21/15 0303 98 F (36.7 C)     Temp Source 01/21/15 0303 Axillary     SpO2 01/21/15 0303 82 %     Weight  01/21/15 0303 235 lb (106.595 kg)     Height 01/21/15 0303 5' 6"  (1.676 m)     Head Cir --      Peak Flow --      Pain Score 01/21/15 0309 0     Pain Loc --      Pain Edu? --      Excl. in [place]? --      Constitutional: Alert.  Respiratory distress, on bipap. Eyes: Conjunctivae are normal. PERRL. Normal extraocular movements. ENT   Head: Normocephalic and atraumatic.   Nose: No congestion/rhinnorhea.   Mouth/Throat: Mucous membranes are moist.   Neck: No stridor. Cardiovascular/Chest:Tachycardic, regular  No murmurs, rubs, or gallops. Respiratory:Tachypnea no retractions. Positive rales posteriorly.  decr air movement throughout.  no  wheezing. Gastrointestinal: Soft. No distention, no guarding, no rebound. Nontender. Obese Genitourinary/rectal:Deferred Musculoskeletal: Nontender with normal range of motion in all extremities. No joint effusions.  No lower extremity tenderness.  3+ le edema bilat Neurologic:  Normal speech and language. No gross or focal neurologic deficits are appreciated. Skin:  Skin is warm, dry and intact. No rash noted. Psychiatric: Mood and affect are normal. Speech and behavior are normal. Patient exhibits appropriate insight and judgment.  ____________________________________________   EKG I, Lisa Roca, MD, the attending physician have personally viewed and interpreted all ECGs.  134 bpm. Sinus tachycardia. Nonspecific intraventricular conduction delay. Nonspecific ST and T-wave with T-wave inversions in for lead 3 and laterally V4 and V5.  Repeat EKG. 103 bpm. Sinus tachycardia. Nonspecific intraventricular conduction delay. Normal axis. Nonspecific ST and T-wave with T-wave inversion and slight ST depression in V6. When compared with September 2016 EKG, this appears similar.   ____________________________________________  LABS (pertinent positives/negatives)  Troponin 0.06 Lactate 3.1 Comprehensive metabolic panel significant for AST 73, a LT 72 and alkaline phosphatase 169 otherwise without significant Ground CBC shows white blood count 8.7 with no left shift. Hemoglobin 11.8 and platelet count 601 D-dimer 1161 BNP pending  ____________________________________________  RADIOLOGY All Xrays were viewed by me. Imaging interpreted by Radiologist.  Chest portable 1 view:  IMPRESSION: Vascular congestion and mild cardiomegaly. Bibasilar airspace opacities raise concern for mild pulmonary edema. Small bilateral pleural effusions suspected  CT PE:  IMPRESSION: 1. No evidence of pulmonary embolus. 2. Small bilateral pleural effusions noted. Interstitial prominence, raising  concern for mild interstitial edema. Mild bibasilar atelectasis seen. 3. Diffuse coronary artery calcification noted. 4. Vague nonspecific soft tissue inflammation about the distal trachea and azygoesophageal region.  __________________________________________  PROCEDURES  Procedure(s) performed: None  Critical Care performed: CRITICAL CARE Performed by: Lisa Roca   Total critical care time: 60 minutes  Critical care time was exclusive of separately billable procedures and treating other patients.  Critical care was necessary to treat or prevent imminent or life-threatening deterioration.  Critical care was time spent personally by me on the following activities: development of treatment plan with patient and/or surrogate as well as nursing, discussions with consultants, evaluation of patient's response to treatment, examination of patient, obtaining history from patient or surrogate, ordering and performing treatments and interventions, ordering and review of laboratory studies, ordering and review of radiographic studies, pulse oximetry and re-evaluation of patient's condition.   ____________________________________________   ED COURSE / ASSESSMENT AND PLAN  CONSULTATIONS: Hospitalist for admission  Pertinent labs & imaging results that were available during my care of the patient were reviewed by me and considered in my medical decision making (see chart for details).  Patient arrived on BiPAP with  hypoxia and hypotension at home and Route 1 clinical exam, clinically suspicious for CHF exacerbation/acute pulmonary edema.  Chest x-ray shows no focal infiltrate, and she's not had a fever, and a white blood cell count is normal, and I'm not suspicious of pneumonia/infection as a source of her respiratory distress.  1 and a nitroglycerin paste was placed for help with acute pulmonary edema and hypertension. BiPAP was continued. IV Lasix was ordered. Foley catheter ordered  for aggressive IV diuresis in the setting of respiratory distress.  I was called to the bedside by the nurse because the patient was complained she couldn't breathe. Her O2 sat was still 99% on the BiPAP. Her respiratory rate was in the 40s. Patient is very anxious. Blood pressure significantly elevated. Nitropaste was removed and patient was given several nitros 3. Her blood pressure and clinical status did improve with this treatment. Heart rate came down into the 120s, blood pressure came down to systolic 573, and her respiratory rate came down into the 20s.  BNP is pending. Clinically suspect CHF as the source of her respiratory distress. Consider PE if not improving as expected with clinical scenario.   Patient / Family / Caregiver informed of clinical course, medical decision-making process, and agree with plan.  ----------------------------------------- 5:26 AM on 01/21/2015 -----------------------------------------  Additional history obtained from the hospitalist and discussed with the patient is that she did have some chest discomfort yesterday evening around 7 PM and then woke up acutely short of breath around 2 AM. Her initial EKG when she came in showed some evidence of cardiac strain anteriorly and laterally. These changes have mostly resolved on repeat EKG. However with her troponin minimally elevated at 0.06, patient will be given aspirin and started on heparin bolus and drip until repeat troponin shows resolution. I did discuss the case with the on-call unassigned cardiologist Dr.Paraschos, in agreement.  Patient will be admitted to the medicine/ICU and cardiology will consult there.  Dr.Reddy asked to go ahead and obtain CT to rule out PE in the emergency department.  ----------------------------------------- 7:02 AM on 01/21/2015 -----------------------------------------  I updated the oncoming hospitalist Dr. Benjie Karvonen, pt ready for  admission.  ___________________________________________   FINAL CLINICAL IMPRESSION(S) / ED DIAGNOSES   Final diagnoses:  Acute respiratory failure with hypoxia ([place])  Acute systolic congestive heart failure ([facility])       Lisa Roca, MD 01/21/15 2202  Lisa Roca, MD 01/21/15 [phone]

## 2015-01-21 NOTE — Progress Notes (Signed)
Patient is alert and oriented. Reporting no pain. To ICU on BIPAP this am, transitioned to 2L nasal cannula. Patient reports SOB improved and oxygen saturation has been WNL on 2L n/c. ECHO completed at bedside. Cardio consult completed. Poor appetite. Foley in place draining to bag, good output since admission. Family at bedside. Report called to Tammy. Will transfer shortly to room 235

## 2015-01-21 NOTE — ED Notes (Signed)
Pt with improved resp rate.

## 2015-01-21 NOTE — H&P (Signed)
[facility] NAME: Diamond Boyd    MR#:  [id number]  DATE OF BIRTH:  16-Sep-1940  DATE OF ADMISSION:  01/21/2015  PRIMARY CARE PHYSICIAN: [name], MD   REQUESTING/REFERRING PHYSICIAN: Dr. Lisa Roca  CHIEF COMPLAINT:   Chief Complaint  Patient presents with  . Respiratory Distress    HISTORY OF PRESENT ILLNESS:  Diamond Boyd  is a 74 y.o. 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 Patient was stressed at [facility] from 01/18/2015 and discharged yesterday for her chemotherapy. She received RICE therapy for her B cell lymphoma. She has been having some shortness of breath at baseline recently on exertional activities, thought to be secondary to generalized weakness since her chemotherapy was started. No prior cardiac problems, no history of CAD or congestive heart failure in the past. No family history. She had a viral cold, cough, congestion and diagnosed with acute sinusitis, couple of weeks ago and was treated with Z-Pak and that improved. No recent fevers or chills. No cough or phlegm. While she was walking around nurse's station yesterday prior to discharge she had some burning chest pain that she neglected. After they went home her breathing got worse, last night she couldn't lay flat, couldn't get comfortable at all and was brought to the emergency room. She was hypoxic and tachypneic and was placed on BiPAP. CT of the chest did not reveal any PE but showed pulmonary vascular congestion. So patient is being admitted for acute pulmonary edema.  PAST MEDICAL HISTORY:   Past Medical History  Diagnosis Date  . Colon polyps   .  Depression   . Hypothyroidism   . Osteoarthritis     hands  . Peripheral vascular disease ([place])   . Degenerative disk disease     spine in center in past   . 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 Cancer ([place])     skin cancer- basal cell on arm / colon 2011  . Lymphoma ([place])   . Colon cancer ([place])     2011, s/p surgery, in remission  . Neuropathy due to chemotherapeutic drug ([place]) 06/19/2014  . hodgkins lymphoma   . Heart murmur   . GERD (gastroesophageal reflux disease)   . Anemia     hx blood tx  . Pneumonia   . 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 Hypertension     PAST SURGICAL HISTORY:   Past Surgical History  Procedure Laterality Date  . Cholecystectomy    . Splenectomy      lymphoma  . Breast biopsy  1996  . Plantar fascia release    . Ventral hernia repair  1998  . Tendon release      Right thumb   . Colectomy  8/11  . Vaginal hysterectomy    . Bone marrow biopsy  05/27/13  . Cataract extraction, bilateral  2016  . Port-a-cath insertion    . Colonoscopy w/ biopsies    . Dg biopsy lung    . Video bronchoscopy with endobronchial ultrasound N/A 10/17/2014    Procedure: VIDEO BRONCHOSCOPY WITH ENDOBRONCHIAL ULTRASOUND;  Surgeon: Javier Glazier, MD;  Location: [facility]  OR;  Service: Thoracic;  Laterality: N/A;  . Video bronchoscopy with endobronchial ultrasound N/A 11/01/2014    Procedure: VIDEO BRONCHOSCOPY WITH ENDOBRONCHIAL ULTRASOUND;  Surgeon: Grace Isaac, MD;  Location: [facility];  Service: Thoracic;  Laterality: N/A;  . Mediastinoscopy N/A 11/01/2014    Procedure: MEDIASTINOSCOPY;  Surgeon: Grace Isaac, MD;  Location: [facility];  Service: Thoracic;  Laterality: N/A;  . Lymph node biopsy N/A 11/01/2014    Procedure: MEDIASTINAL LYMPH NODE BIOPSY;  Surgeon: Grace Isaac, MD;  Location: [facility];  Service: Thoracic;  Laterality: N/A;    SOCIAL HISTORY:   Social  History  Substance Use Topics  . Smoking status: Never Smoker   . Smokeless tobacco: Never Used     Comment: Second-hand exposure through father.  . Alcohol Use: No    FAMILY HISTORY:   Family History  Problem Relation Age of Onset  . Coronary artery disease Mother   . Alcohol abuse Mother   . Diabetes Mother   . Diabetes Brother   . Fibromyalgia Daughter     chronic pain   . COPD Daughter   . Colon cancer Neg Hx   . Colon polyps Neg Hx   . Stomach cancer Neg Hx   . Rectal cancer Neg Hx   . Ulcerative colitis Neg Hx   . Crohn's disease Neg Hx   . Asthma Daughter   . Rheum arthritis Brother   . Clotting disorder Brother     DRUG ALLERGIES:   Allergies  Allergen Reactions  . Achromycin [Tetracycline Hcl] Other (See Comments)    Pt does not remember reaction  . Allopurinol Other (See Comments)    REACTION: Unsure of reaction happene years ago  . Astelin [Azelastine Hcl] Other (See Comments)    Reaction unknown  . Cephalexin Other (See Comments)    REACTION: unsure of reaction happened yrs ago.  . Codeine Other (See Comments)    REACTION: abd. pain  . Meloxicam Other (See Comments)    REACTION: GI symptoms  . Minocycline Other (See Comments)    Abdominal pain  . Nabumetone Other (See Comments)    REACTION: reaction not known  . Nyquil [Pseudoeph-Doxylamine-Dm-Apap] Hives  . Penicillins Other (See Comments)    Reaction unknown Has patient had a PCN reaction causing immediate rash, facial/tongue/throat swelling, SOB or lightheadedness with hypotension: unsure reaction unknown Has patient had a PCN reaction causing severe rash involving mucus membranes or skin necrosis: unsure reaction unknown Has patient had a PCN reaction that required hospitalization no Has patient had a PCN reaction occurring within the last 10 years: no If all of the above answers are "NO", then may proceed with Cephalosporin use.  . Sulfa Antibiotics Other (See Comments)    Gi side eff   .  Zolpidem Tartrate Other (See Comments)    REACTION: feels too drugged  . Buspar [Buspirone Hcl] Other (See Comments)    Dizziness, and not as effective for anxiety  . Ciprofloxacin Rash    REVIEW OF SYSTEMS:   Review of Systems  Constitutional: Positive for malaise/fatigue. Negative for fever, chills and weight loss.  HENT: Negative for ear discharge, ear pain, hearing loss, nosebleeds and tinnitus.   Eyes: Negative for blurred vision, double vision and photophobia.  Respiratory: Positive for shortness of breath. Negative for cough, hemoptysis and wheezing.   Cardiovascular: Positive for leg swelling. Negative for chest pain, palpitations and orthopnea.  Gastrointestinal: Negative for heartburn, nausea, vomiting, abdominal pain, diarrhea, constipation and melena.  Genitourinary: Negative for dysuria, urgency, frequency and hematuria.  Musculoskeletal: Negative for myalgias, back pain and neck pain.  Skin: Negative for rash.  Neurological: Positive for tingling. Negative for dizziness, tremors, sensory change, speech change, focal weakness and headaches.       Right leg neuropathy  Endo/Heme/Allergies: Does not bruise/bleed easily.  Psychiatric/Behavioral: Negative for depression.    MEDICATIONS AT HOME:   Prior to Admission medications   Medication Sig Start Date End Date Taking? Authorizing Provider  acetaminophen (TYLENOL) 650 MG CR tablet Take 650 mg by mouth every 8 (eight) hours as needed (For back pain.).   Yes Historical Provider, MD  acyclovir (ZOVIRAX) 400 MG tablet Take 400 mg by mouth daily.   Yes Historical Provider, MD  Calcium Carbonate-Vitamin D (CALCIUM 600+D) 600-400 MG-UNIT per tablet Take 1 tablet by mouth every evening.    Yes Historical Provider, MD  celecoxib (CELEBREX) 200 MG capsule Take 200 mg by mouth 2 (two) times daily.   Yes Historical Provider, MD  Cholecalciferol (VITAMIN D-3) 1000 UNITS CAPS Take 1,000 Units by mouth daily.   Yes Historical Provider,  MD  docusate sodium (COLACE) 100 MG capsule Take 100 mg by mouth at bedtime.   Yes Historical Provider, MD  estradiol (ESTRACE) 1 MG tablet Take 1 mg by mouth daily.   Yes Historical Provider, MD  FLUoxetine (PROZAC) 20 MG capsule Take 20 mg by mouth daily.   Yes Historical Provider, MD  fluticasone (FLONASE) 50 MCG/ACT nasal spray Place 2 sprays into both nostrils daily.   Yes Historical Provider, MD  gabapentin (NEURONTIN) 300 MG capsule Take 300 mg by mouth 2 (two) times daily.   Yes Historical Provider, MD  levothyroxine (SYNTHROID, LEVOTHROID) 112 MCG tablet Take 112 mcg by mouth daily before breakfast.   Yes Historical Provider, MD  lidocaine-prilocaine (EMLA) cream Apply 1 application topically as needed (For port-a-cath.).   Yes Historical Provider, MD  loratadine (CLARITIN) 10 MG tablet Take 10 mg by mouth daily.   Yes Historical Provider, MD  Omega-3 350 MG CAPS Take 1 capsule by mouth daily.   Yes Historical Provider, MD  omeprazole (PRILOSEC) 40 MG capsule Take 40 mg by mouth daily.   Yes Historical Provider, MD  ondansetron (ZOFRAN) 8 MG tablet Take 8 mg by mouth every 6 (six) hours as needed for nausea or vomiting.  12/25/14  Yes Historical Provider, MD  polyethylene glycol (MIRALAX / GLYCOLAX) packet Take 17 g by mouth at bedtime.   Yes Historical Provider, MD  prochlorperazine (COMPAZINE) 10 MG tablet Take 10 mg by mouth every 6 (six) hours as needed for nausea or vomiting.  12/25/14  Yes Historical Provider, MD  psyllium (METAMUCIL) 58.6 % packet Take 1-3 packets by mouth at bedtime.    Yes Historical Provider, MD  simvastatin (ZOCOR) 40 MG tablet Take 40 mg by mouth at bedtime. 01/08/15  Yes Historical Provider, MD  vitamin E (VITAMIN E) 400 UNIT capsule Take 400 Units by mouth daily.    Yes Historical Provider, MD      VITAL SIGNS:  Blood pressure 117/75, pulse 99, temperature 98 F (36.7 C), temperature source Axillary, resp. rate 27, height 5' 6" (1.676 m), weight 106.595  kg (235 lb), last menstrual period 02/10/1970, SpO2 100 %.  PHYSICAL EXAMINATION:   Physical Exam  GENERAL:  74 y.o.-year-old obese patient lying in the bed, ill appearing, on Bipap. Presently doesn't seem to be in distress  EYES: Pupils equal, round, reactive to light and accommodation. No  scleral icterus. Extraocular muscles intact.  HEENT: Head atraumatic, normocephalic. Oropharynx and nasopharynx clear. Bipap in place NECK:  Supple, no jugular venous distention. No thyroid enlargement, no tenderness.  LUNGS: Normal breath sounds bilaterally, no wheezing, rhonchi. Fine bibasilar crackles, decreased basilar breath sounds due to body habitus. No use of accessory muscles of respiration.  CARDIOVASCULAR: S1, S2 normal. No murmurs, rubs, or gallops.  ABDOMEN: Soft, nontender, nondistended. Bowel sounds present. No organomegaly or mass.  EXTREMITIES: No cyanosis, or clubbing. 1+ pedal edema noted NEUROLOGIC: Cranial nerves II through XII are intact. Muscle strength 5/5 in all extremities. Sensation intact. Gait not checked.  PSYCHIATRIC: The patient is alert and oriented x 3.  SKIN: No obvious rash, lesion, or ulcer.   LABORATORY PANEL:   CBC  Recent Labs Lab 01/21/15 0310  WBC 8.7  HGB 11.8*  HCT 35.2  PLT 601*   ------------------------------------------------------------------------------------------------------------------  Chemistries   Recent Labs Lab 01/21/15 0310  NA 139  K 3.9  CL 110  CO2 22  GLUCOSE 136*  BUN 13  CREATININE 0.67  CALCIUM 9.0  AST 73*  ALT 72*  ALKPHOS 169*  BILITOT 0.7   ------------------------------------------------------------------------------------------------------------------  Cardiac Enzymes  Recent Labs Lab 01/21/15 0310  TROPONINI 0.06*   ------------------------------------------------------------------------------------------------------------------  RADIOLOGY:  Ct Angio Chest Pe W/cm &/or Wo Cm  01/21/2015   CLINICAL DATA:  Acute onset of respiratory distress. Tachypnea. Initial encounter. EXAM: CT ANGIOGRAPHY CHEST WITH CONTRAST TECHNIQUE: Multidetector CT imaging of the chest was performed using the standard protocol during bolus administration of intravenous contrast. Multiplanar CT image reconstructions and MIPs were obtained to evaluate the vascular anatomy. CONTRAST:  164m OMNIPAQUE IOHEXOL 350 MG/ML SOLN COMPARISON:  Chest radiograph performed earlier today at 3:03 a.m., and PET/CT performed 12/13/2014 FINDINGS: There is no evidence of pulmonary embolus. Small bilateral pleural effusions are noted. Interstitial prominence is noted, raising concern for mild interstitial edema. Mild bibasilar atelectasis is seen. There is no evidence of pneumothorax. No masses are identified; no abnormal focal contrast enhancement is seen. Diffuse coronary artery calcifications are seen. Vague nonspecific soft tissue inflammation is noted about the distal trachea and azygoesophageal region. No pericardial effusion is identified. No mediastinal lymphadenopathy is seen. The great vessels are grossly unremarkable in appearance. No axillary lymphadenopathy is seen. The visualized portions of the thyroid gland are unremarkable in appearance. A right-sided chest port is noted ending about the distal SVC. The visualized portions of the liver and spleen are unremarkable. No acute osseous abnormalities are seen. There is mild chronic osseous fusion at T6-T7. Review of the MIP images confirms the above findings. IMPRESSION: 1. No evidence of pulmonary embolus. 2. Small bilateral pleural effusions noted. Interstitial prominence, raising concern for mild interstitial edema. Mild bibasilar atelectasis seen. 3. Diffuse coronary artery calcification noted. 4. Vague nonspecific soft tissue inflammation about the distal trachea and azygoesophageal region. Electronically Signed   By: JGarald BaldingM.D.   On: 01/21/2015 06:58   Dg Chest Port 1  View  01/21/2015  CLINICAL DATA:  Acute onset of respiratory distress. Initial encounter. EXAM: PORTABLE CHEST 1 VIEW COMPARISON:  Chest radiograph performed 12/30/2014 FINDINGS: The lungs are well-aerated. Vascular congestion is noted. Bibasilar airspace opacities raise concern for mild pulmonary edema. Small bilateral pleural effusions are suspected. No pneumothorax is seen. There is no evidence of pneumothorax. The cardiomediastinal silhouette is mildly enlarged. A right-sided chest port is noted ending about the mid SVC. No acute osseous abnormalities are seen. IMPRESSION: Vascular congestion and mild cardiomegaly. Bibasilar  airspace opacities raise concern for mild pulmonary edema. Small bilateral pleural effusions suspected. Electronically Signed   By: Garald Balding M.D.   On: 01/21/2015 03:22    EKG:   Orders placed or performed during the hospital encounter of 01/21/15  . EKG 12-Lead  . EKG 12-Lead  . EKG 12-Lead  . EKG 12-Lead    IMPRESSION AND PLAN:   Diamond Boyd  is a 74 y.o. 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  #1 acute respiratory distress-secondary to acute pulmonary edema -No prior echo, echocardiogram ordered. Cardiology consulted. -Lasix IV daily started. CT of the chest negative for any pulmonary embolism or infiltrate. -Continue BiPAP as tolerated and try to wean to nasal cannula. We will admit to stepdown unit at this time.  #2 elevated troponin-likely demand ischemia. No acute ST changes on EKG. -Monitor on telemetry, troponins have been ordered  #3 non-Hodgkin's lymphoma-malignant follicular B-cell lymphoma-currently on chemotherapy. -PET scan scheduled in 2 weeks. -Last  chemotherapy dose 2 days ago. Follow-up with oncology as scheduled  #4 chronic anemia-stable at this time. No indication for any transfusion. Continue to monitor   #5 hypothyroidism-on Synthroid  #6 history of cold sores - while on chemotherapy has been on prophylactic acyclovir daily.  #7 DVT prophylaxis-Lovenox  Physical Therapy consult once more stable    All the records are reviewed and case discussed with ED provider. Management plans discussed with the patient, family and they are in agreement.  CODE STATUS: Full Code  TOTAL TIME TAKING CARE OF THIS PATIENT: 50 minutes.    Gladstone Lighter M.D on 01/21/2015 at 7:38 AM  Between 7am to 6pm - Pager - [phone]  After 6pm go to [email or web address] - password [email or web address] [facility]  [place]  Office  [phone]  CC: Primary care physician; [name], MD

## 2015-01-21 NOTE — Consult Note (Signed)
[facility] Cardiology  CARDIOLOGY CONSULT NOTE  Patient ID: MYRTIS MAILLE MRN: [id number] DOB/AGE: 13-Jul-1940 74 y.o.  Admit date: 01/21/2015 Referring Physician [facility] Primary Physician [facility] Primary Cardiologist  Reason for Consultation congestive heart failure  HPI: The patient is a 74 year old female with history of Hodgkin's lymphoma and recent diagnosis of malignant B cell follicular lymphoma, referred for evaluation of chest discomfort with shortness of breath, and borderline elevated troponin. The patient was recently diagnosed with malignant B cell follicular lymphoma, underwent chemotherapy on 01/19/15, been experiencing exertional dyspnea. Early this morning, the patient appears worsening shortness of breath, and chest tightness. The patient presented to [facility] emergency room she was noted be hypoxic and tachypnea, treated with BiPAP and intravenous furosemide with diuresis and overall clinical improvement. EKG revealed sinus rhythm with nonspecific intraventricular conduction delay which appeared unchanged. Chest CT was performed which did not reveal evidence for pulmonary embolus. The patient currently is chest pain-free, reports feeling much better after diuresis. Admission labs were notable for borderline elevated troponin of 0.16.  Review of systems complete and found to be negative unless listed above     Past Medical History  Diagnosis Date  . Colon polyps   . Depression   . Hypothyroidism   . Osteoarthritis     hands  . Peripheral vascular disease ([place])   . Degenerative disk disease     spine in center in past   . 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 Cancer ([place])     skin cancer- basal cell on arm / colon 2011  . Lymphoma ([place])   . Colon cancer ([place])     2011, s/p surgery, in remission  . Neuropathy due to chemotherapeutic drug ([place]) 06/19/2014  . hodgkins lymphoma   . Heart murmur   . GERD (gastroesophageal reflux disease)   . Anemia     hx blood tx  . Pneumonia    . 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 ([place]) 12/11/2014    malignant B cell lymphoma- being treated actively  . Hypertension     Past Surgical History  Procedure Laterality Date  . Cholecystectomy    . Splenectomy      lymphoma  . Breast biopsy  1996  . Plantar fascia release    . Ventral hernia repair  1998  . Tendon release      Right thumb   . Colectomy  8/11  . Vaginal hysterectomy    . Bone marrow biopsy  05/27/13  . Cataract extraction, bilateral  2016  . Port-a-cath insertion    . Colonoscopy w/ biopsies    . Dg biopsy lung    . Video bronchoscopy with endobronchial ultrasound N/A 10/17/2014    Procedure: VIDEO BRONCHOSCOPY WITH ENDOBRONCHIAL ULTRASOUND;  Surgeon: Javier Glazier, MD;  Location: [place];  Service: Thoracic;  Laterality: N/A;  . Video bronchoscopy with endobronchial ultrasound N/A 11/01/2014    Procedure: VIDEO BRONCHOSCOPY WITH ENDOBRONCHIAL ULTRASOUND;  Surgeon: Grace Isaac, MD;  Location: [place];  Service: Thoracic;  Laterality: N/A;  . Mediastinoscopy N/A 11/01/2014    Procedure: MEDIASTINOSCOPY;  Surgeon: Grace Isaac, MD;  Location: [place];  Service: Thoracic;  Laterality: N/A;  . Lymph node biopsy N/A 11/01/2014    Procedure: MEDIASTINAL LYMPH NODE BIOPSY;  Surgeon: Grace Isaac, MD;  Location: [place];  Service: Thoracic;  Laterality: N/A;    Prescriptions prior to admission  Medication Sig Dispense Refill Last Dose  . acetaminophen (TYLENOL) 650 MG  CR tablet Take 650 mg by mouth every 8 (eight) hours as needed (For back pain.).   prn at prn  . acyclovir (ZOVIRAX) 400 MG tablet Take 400 mg by mouth daily.   01/20/2015 at Unknown time  . Calcium Carbonate-Vitamin D (CALCIUM 600+D) 600-400 MG-UNIT per tablet Take 1 tablet by mouth every evening.    01/20/2015 at Unknown time  . celecoxib (CELEBREX) 200 MG capsule Take 200 mg by mouth 2 (two) times daily.   Past Week at Unknown time  . Cholecalciferol (VITAMIN D-3) 1000  UNITS CAPS Take 1,000 Units by mouth daily.   01/20/2015 at Unknown time  . docusate sodium (COLACE) 100 MG capsule Take 100 mg by mouth at bedtime.   01/20/2015 at Unknown time  . estradiol (ESTRACE) 1 MG tablet Take 1 mg by mouth daily.   01/20/2015 at Unknown time  . FLUoxetine (PROZAC) 20 MG capsule Take 20 mg by mouth daily.   01/20/2015 at Unknown time  . fluticasone (FLONASE) 50 MCG/ACT nasal spray Place 2 sprays into both nostrils daily.   01/20/2015 at Unknown time  . gabapentin (NEURONTIN) 300 MG capsule Take 300 mg by mouth 2 (two) times daily.   01/20/2015 at Unknown time  . levothyroxine (SYNTHROID, LEVOTHROID) 112 MCG tablet Take 112 mcg by mouth daily before breakfast.   01/20/2015 at Unknown time  . lidocaine-prilocaine (EMLA) cream Apply 1 application topically as needed (For port-a-cath.).   prn at prn  . loratadine (CLARITIN) 10 MG tablet Take 10 mg by mouth daily.   01/20/2015 at Unknown time  . Omega-3 350 MG CAPS Take 1 capsule by mouth daily.   01/20/2015 at Unknown time  . omeprazole (PRILOSEC) 40 MG capsule Take 40 mg by mouth daily.   01/20/2015 at Unknown time  . ondansetron (ZOFRAN) 8 MG tablet Take 8 mg by mouth every 6 (six) hours as needed for nausea or vomiting.    prn at prn  . polyethylene glycol (MIRALAX / GLYCOLAX) packet Take 17 g by mouth at bedtime.   01/20/2015 at Unknown time  . prochlorperazine (COMPAZINE) 10 MG tablet Take 10 mg by mouth every 6 (six) hours as needed for nausea or vomiting.    01/20/2015 at Unknown time  . psyllium (METAMUCIL) 58.6 % packet Take 1-3 packets by mouth at bedtime.    01/20/2015 at Unknown time  . simvastatin (ZOCOR) 40 MG tablet Take 40 mg by mouth at bedtime.   01/20/2015 at Unknown time  . vitamin E (VITAMIN E) 400 UNIT capsule Take 400 Units by mouth daily.    01/20/2015 at Unknown time   Social History   Social History  . Marital Status: Married    Spouse Name: N/A  . Number of Children: 2  . Years of Education: N/A    Occupational History  . Retired     Social History Main Topics  . Smoking status: Never Smoker   . Smokeless tobacco: Never Used     Comment: Second-hand exposure through father.  . Alcohol Use: No  . Drug Use: No  . Sexual Activity: No   Other Topics Concern  . Not on file   Social History Narrative   Originally from [place]. Always lived in [place]. Previously has traveled to [facility], [place] & up [employer] to [place]. No international travel. No pets currently. Remote parakeet exposure with her children. 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    Lives at home with husband. Weakness due to chemo,  but otherwise independant       Family History  Problem Relation Age of Onset  . Coronary artery disease Mother   . Alcohol abuse Mother   . Diabetes Mother   . Diabetes Brother   . Fibromyalgia Daughter     chronic pain   . COPD Daughter   . Colon cancer Neg Hx   . Colon polyps Neg Hx   . Stomach cancer Neg Hx   . Rectal cancer Neg Hx   . Ulcerative colitis Neg Hx   . Crohn's disease Neg Hx   . Asthma Daughter   . Rheum arthritis Brother   . Clotting disorder Brother       Review of systems complete and found to be negative unless listed above      PHYSICAL EXAM  General: Well developed, well nourished, in no acute distress HEENT:  Normocephalic and atramatic Neck:  No JVD.  Lungs: Clear bilaterally to auscultation and percussion. Heart: HRRR . Normal S1 and S2 without gallops or murmurs.  Abdomen: Bowel sounds are positive, abdomen soft and non-tender  Msk:  Back normal, normal gait. Normal strength and tone for age. Extremities: No clubbing, cyanosis or edema.   Neuro: Alert and oriented X 3. Psych:  Good affect, responds appropriately  Labs:   Lab Results  Component Value Date   WBC 8.7 01/21/2015   HGB 11.8* 01/21/2015   HCT 35.2 01/21/2015   MCV 95.2 01/21/2015   PLT 601* 01/21/2015    Recent Labs Lab 01/21/15 0310  NA 139  K 3.9  CL  110  CO2 22  BUN 13  CREATININE 0.67  CALCIUM 9.0  PROT 7.1  BILITOT 0.7  ALKPHOS 169*  ALT 72*  AST 73*  GLUCOSE 136*   Lab Results  Component Value Date   CKTOTAL 81 09/28/2009   CKMB 2.3 09/28/2009   TROPONINI 0.16* 01/21/2015    Lab Results  Component Value Date   CHOL 160 09/16/2013   CHOL 208* 09/17/2012   CHOL 204* 07/18/2011   Lab Results  Component Value Date   HDL 45.20 09/16/2013   HDL 49.40 09/17/2012   HDL 60.70 07/18/2011   Lab Results  Component Value Date   LDLCALC 85 09/16/2013   LDLCALC 105* 07/18/2009   LDLCALC 100* 07/05/2008   Lab Results  Component Value Date   TRIG 147.0 09/16/2013   TRIG 174.0* 09/17/2012   TRIG 140.0 07/18/2011   Lab Results  Component Value Date   CHOLHDL 4 09/16/2013   CHOLHDL 4 09/17/2012   CHOLHDL 3 07/18/2011   Lab Results  Component Value Date   LDLDIRECT 133.0 09/17/2012   LDLDIRECT 134.0 07/18/2011   LDLDIRECT 126.0 02/19/2011      Radiology: Ct Angio Chest Pe W/cm &/or Wo Cm  01/21/2015  CLINICAL DATA:  Acute onset of respiratory distress. Tachypnea. Initial encounter. EXAM: CT ANGIOGRAPHY CHEST WITH CONTRAST TECHNIQUE: Multidetector CT imaging of the chest was performed using the standard protocol during bolus administration of intravenous contrast. Multiplanar CT image reconstructions and MIPs were obtained to evaluate the vascular anatomy. CONTRAST:  131m OMNIPAQUE IOHEXOL 350 MG/ML SOLN COMPARISON:  Chest radiograph performed earlier today at 3:03 a.m., and PET/CT performed 12/13/2014 FINDINGS: There is no evidence of pulmonary embolus. Small bilateral pleural effusions are noted. Interstitial prominence is noted, raising concern for mild interstitial edema. Mild bibasilar atelectasis is seen. There is no evidence of pneumothorax. No masses are identified; no abnormal focal contrast enhancement is seen. Diffuse  coronary artery calcifications are seen. Vague nonspecific soft tissue inflammation is noted  about the distal trachea and azygoesophageal region. No pericardial effusion is identified. No mediastinal lymphadenopathy is seen. The great vessels are grossly unremarkable in appearance. No axillary lymphadenopathy is seen. The visualized portions of the thyroid gland are unremarkable in appearance. A right-sided chest port is noted ending about the distal SVC. The visualized portions of the liver and spleen are unremarkable. No acute osseous abnormalities are seen. There is mild chronic osseous fusion at T6-T7. Review of the MIP images confirms the above findings. IMPRESSION: 1. No evidence of pulmonary embolus. 2. Small bilateral pleural effusions noted. Interstitial prominence, raising concern for mild interstitial edema. Mild bibasilar atelectasis seen. 3. Diffuse coronary artery calcification noted. 4. Vague nonspecific soft tissue inflammation about the distal trachea and azygoesophageal region. Electronically Signed   By: Garald Balding M.D.   On: 01/21/2015 06:58   Dg Chest Port 1 View  01/21/2015  CLINICAL DATA:  Acute onset of respiratory distress. Initial encounter. EXAM: PORTABLE CHEST 1 VIEW COMPARISON:  Chest radiograph performed 12/30/2014 FINDINGS: The lungs are well-aerated. Vascular congestion is noted. Bibasilar airspace opacities raise concern for mild pulmonary edema. Small bilateral pleural effusions are suspected. No pneumothorax is seen. There is no evidence of pneumothorax. The cardiomediastinal silhouette is mildly enlarged. A right-sided chest port is noted ending about the mid SVC. No acute osseous abnormalities are seen. IMPRESSION: Vascular congestion and mild cardiomegaly. Bibasilar airspace opacities raise concern for mild pulmonary edema. Small bilateral pleural effusions suspected. Electronically Signed   By: Garald Balding M.D.   On: 01/21/2015 03:22   Dg Chest Port 1 View  12/30/2014  CLINICAL DATA:  Shortness of breath. EXAM: PORTABLE CHEST 1 VIEW COMPARISON:  Chest  radiograph 11/01/2014.  PET-CT 12/13/2014 FINDINGS: Tip of the right chest port in the SVC. Mild cardiomegaly is unchanged. Development of mild pulmonary edema. Question left pleural effusion. Ill-defined linear opacities at the lung bases, likely atelectasis. No pneumothorax. No confluent airspace disease. IMPRESSION: 1. Development of pulmonary edema.  Suspect left pleural effusion. 2. Stable mild cardiomegaly. Electronically Signed   By: Jeb Levering M.D.   On: 12/30/2014 05:57    EKG: Sinus rhythm with intra-ventricular conduction delay  ASSESSMENT AND PLAN:   1. Borderline elevated troponin, probable man supply ischemia, not due to acute coronary syndrome in the setting of recent chemotherapy for non-Hodgkin's lymphoma 2. Acute diastolic CHF following chemotherapy, resolved after venous furosemide and diuresis  Recommendations  1. Continue to cycle cardiac enzymes 2. Continue diuresis 3. Review 2-D echocardiogram 4. Further recommendations pending echocardiogram results    Signed: Elika Godar MD,PhD, [facility] 01/21/2015, 11:01 AM

## 2015-01-22 ENCOUNTER — Ambulatory Visit: Payer: Medicare Other

## 2015-01-22 ENCOUNTER — Telehealth: Payer: Self-pay | Admitting: *Deleted

## 2015-01-22 ENCOUNTER — Inpatient Hospital Stay: Payer: Medicare Other

## 2015-01-22 LAB — BASIC METABOLIC PANEL
Anion gap: 5 (ref 5–15)
BUN: 23 mg/dL — AB (ref 6–20)
CALCIUM: 9 mg/dL (ref 8.9–10.3)
CHLORIDE: 106 mmol/L (ref 101–111)
CO2: 27 mmol/L (ref 22–32)
CREATININE: 0.9 mg/dL (ref 0.44–1.00)
GFR calc Af Amer: >60 mL/min (ref >60)
Glucose, Bld: 98 mg/dL (ref 65–99)
Potassium: 3.3 mmol/L — ABNORMAL LOW (ref 3.5–5.1)
SODIUM: 138 mmol/L (ref 135–145)

## 2015-01-22 LAB — CBC
HCT: 30.4 % — ABNORMAL LOW (ref 35.0–47.0)
Hemoglobin: 10.3 g/dL — ABNORMAL LOW (ref 12.0–16.0)
MCH: 31.7 pg (ref 26.0–34.0)
MCHC: 33.7 g/dL (ref 32.0–36.0)
MCV: 94 fL (ref 80.0–100.0)
PLATELETS: 500 10*3/uL — AB (ref 150–440)
RBC: 3.24 MIL/uL — ABNORMAL LOW (ref 3.80–5.20)
RDW: 14.9 % — AB (ref 11.5–14.5)
WBC: 8 10*3/uL (ref 3.6–11.0)

## 2015-01-22 MED ORDER — POTASSIUM CHLORIDE CRYS ER 20 MEQ PO TBCR: 20.0000 meq | EXTENDED_RELEASE_TABLET | Freq: Every day | ORAL | Status: DC

## 2015-01-22 MED ORDER — FUROSEMIDE 20 MG PO TABS: 20.0000 mg | ORAL_TABLET | Freq: Every day | ORAL | Status: DC

## 2015-01-22 MED ORDER — POTASSIUM CHLORIDE CRYS ER 20 MEQ PO TBCR
20.0000 meq | EXTENDED_RELEASE_TABLET | Freq: Every day | ORAL | Status: DC
  Administered 2015-01-22: 20 meq via ORAL
  Filled 2015-01-22: qty 1

## 2015-01-22 NOTE — Progress Notes (Signed)
D/C'd foley per order. Patient has completed trial void at this time, able to ambulate with walker. Completed 3 laps around nurses station. No oxygen required to maintain sats. Patient HR did become elevated (120'-130's) while ambulating, Dr. Posey Pronto notified. Okay with MD to discharge at this time.  Wilnette Kales

## 2015-01-22 NOTE — Discharge Instructions (Signed)
Heart Failure Clinic appointment on February 09, 2015 at 11:30am with Darylene Price, [place]. Please call [phone] to reschedule.

## 2015-01-22 NOTE — Telephone Encounter (Signed)
Neulasta is not approved to be given as inpatient injection They can consult hematology and start ordering neupogen instead

## 2015-01-22 NOTE — Telephone Encounter (Signed)
Voicemail: "Let Dr. Alvy Bimler know she was discharged at 3:00 today and is available to come in 01-23-2015 for the injection so we're calling to make an appointment to get the Neulasta shot."

## 2015-01-22 NOTE — Telephone Encounter (Signed)
Husband notified of message below.

## 2015-01-22 NOTE — Progress Notes (Signed)
Initial appointment made at the [facility] for February 09, 2015 at 11:30am. Thank you.

## 2015-01-22 NOTE — Care Management (Signed)
Patient to discharge to home today.  MD to place order for home health SN, PT, and complete face to face.  Referral has been called to Floydene Flock from [facility] at patients request. RNCM signing off

## 2015-01-22 NOTE — Progress Notes (Signed)
SATURATION QUALIFICATIONS: (This note is used to comply with regulatory documentation for home oxygen)  Patient Saturations on Room Air at Rest =  95%  Patient Saturations on Room Air while Ambulating = 91%  Patient Saturations on     N/A  Liters of oxygen while Ambulating = N/A  Please briefly explain why patient needs home oxygen: 

## 2015-01-22 NOTE — Discharge Summary (Signed)
[facility] [place] NAME: Diamond Boyd    MR#:  [id number]  DATE OF BIRTH:  03-21-1940  DATE OF ADMISSION:  01/21/2015 ADMITTING PHYSICIAN: Gladstone Lighter, MD  DATE OF DISCHARGE: 01/22/15  PRIMARY CARE PHYSICIAN: [name], MD    ADMISSION DIAGNOSIS:  CHF (congestive heart failure) (HCC) [L97.6] Acute systolic congestive heart failure (HCC) [I50.21] Acute respiratory failure with hypoxia (HCC) [J96.01]  DISCHARGE DIAGNOSIS:  Acute systolic CHF-new (EF 73-41% by echo) Ongoing RICE therapy for lymphoma SECONDARY DIAGNOSIS:   Past Medical History  Diagnosis Date  . Colon polyps   . Depression   . Hypothyroidism   . Osteoarthritis     hands  . Peripheral vascular disease ([place])   . Degenerative disk disease     spine in center in past   . 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 Cancer ([place])     skin cancer- basal cell on arm / colon 2011  . Lymphoma ([place])   . Colon cancer ([place])     2011, s/p surgery, in remission  . 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 hodgkins lymphoma   . Heart murmur   . GERD (gastroesophageal reflux disease)   . Anemia     hx blood tx  . Pneumonia   . 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 Hypertension     HOSPITAL COURSE:   Diamond Boyd is a 74 y.o. 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  #1 acute respiratory distress-secondary to acute pulmonary edema due to congestive heart failure acute systolic -No prior echo -Echo showed EF 45-50% - Cardiology consult noted. No further cardiac evaluation  needed. Patient denies chest pain  -Lasix IV daily. uop 7000 ml. Patient feels a whole lot better. - CT of the chest negative for any pulmonary embolism or infiltrate. -Lasix 20 mg by mouth daily along with potassium. Patient will follow up with cardiology as outpatient. -We'll repeat oxygen to room air. DC Foley catheter.  #2 elevated troponin-likely demand ischemia. No acute ST changes on EKG. -suspect due to demand ischemia. Pt cp free  #3 non-Hodgkin's lymphoma-malignant follicular B-cell lymphoma-currently on chemotherapy. -PET scan scheduled in 2 weeks. -Last chemotherapy dose 2 days ago. Follow-up with oncology as scheduled -neulasta shot per oncology as out pt. Husband to call and make appt for it  #4 chronic anemia-stable at this time. No indication for any transfusion. Continue to monitor   #5 hypothyroidism-on Synthroid  #6 history of cold sores - while on chemotherapy has been on prophylactic acyclovir daily.  #7 DVT prophylaxis-Lovenox  Overall stable. Wean to RA, d/c foley and ambulate prior to d/c CONSULTS OBTAINED:  Treatment Team:  Isaias Cowman, MD  DRUG ALLERGIES:   Allergies  Allergen Reactions  . Achromycin [Tetracycline Hcl] Other (See Comments)    Pt does not remember reaction  . Allopurinol Other (See Comments)    REACTION: Unsure of reaction happene years ago  . Astelin [Azelastine Hcl] Other (See Comments)    Reaction unknown  . Cephalexin Other (See Comments)    REACTION: unsure of reaction happened yrs ago.  . Codeine Other (See Comments)  REACTION: abd. pain  . Meloxicam Other (See Comments)    REACTION: GI symptoms  . Minocycline Other (See Comments)    Abdominal pain  . Nabumetone Other (See Comments)    REACTION: reaction not known  . Nyquil [Pseudoeph-Doxylamine-Dm-Apap] Hives  . Penicillins Other (See Comments)    Reaction unknown Has patient had a PCN reaction causing immediate rash, facial/tongue/throat swelling, SOB or  lightheadedness with hypotension: unsure reaction unknown Has patient had a PCN reaction causing severe rash involving mucus membranes or skin necrosis: unsure reaction unknown Has patient had a PCN reaction that required hospitalization no Has patient had a PCN reaction occurring within the last 10 years: no If all of the above answers are "NO", then may proceed with Cephalosporin use.  . Sulfa Antibiotics Other (See Comments)    Gi side eff   . Zolpidem Tartrate Other (See Comments)    REACTION: feels too drugged  . Buspar [Buspirone Hcl] Other (See Comments)    Dizziness, and not as effective for anxiety  . Ciprofloxacin Rash    DISCHARGE MEDICATIONS:   Current Discharge Medication List    START taking these medications   Details  furosemide (LASIX) 20 MG tablet Take 1 tablet (20 mg total) by mouth daily. Qty: 30 tablet, Refills: 0    potassium chloride SA (K-DUR,KLOR-CON) 20 MEQ tablet Take 1 tablet (20 mEq total) by mouth daily. Qty: 30 tablet, Refills: 0      CONTINUE these medications which have NOT CHANGED   Details  acetaminophen (TYLENOL) 650 MG CR tablet Take 650 mg by mouth every 8 (eight) hours as needed (For back pain.).    acyclovir (ZOVIRAX) 400 MG tablet Take 400 mg by mouth daily.    Calcium Carbonate-Vitamin D (CALCIUM 600+D) 600-400 MG-UNIT per tablet Take 1 tablet by mouth every evening.     celecoxib (CELEBREX) 200 MG capsule Take 200 mg by mouth 2 (two) times daily.    Cholecalciferol (VITAMIN D-3) 1000 UNITS CAPS Take 1,000 Units by mouth daily.    docusate sodium (COLACE) 100 MG capsule Take 100 mg by mouth at bedtime.    estradiol (ESTRACE) 1 MG tablet Take 1 mg by mouth daily.    FLUoxetine (PROZAC) 20 MG capsule Take 20 mg by mouth daily.    fluticasone (FLONASE) 50 MCG/ACT nasal spray Place 2 sprays into both nostrils daily.    gabapentin (NEURONTIN) 300 MG capsule Take 300 mg by mouth 2 (two) times daily.    levothyroxine (SYNTHROID,  LEVOTHROID) 112 MCG tablet Take 112 mcg by mouth daily before breakfast.    lidocaine-prilocaine (EMLA) cream Apply 1 application topically as needed (For port-a-cath.).    loratadine (CLARITIN) 10 MG tablet Take 10 mg by mouth daily.    Omega-3 350 MG CAPS Take 1 capsule by mouth daily.    omeprazole (PRILOSEC) 40 MG capsule Take 40 mg by mouth daily.    ondansetron (ZOFRAN) 8 MG tablet Take 8 mg by mouth every 6 (six) hours as needed for nausea or vomiting.     polyethylene glycol (MIRALAX / GLYCOLAX) packet Take 17 g by mouth at bedtime.    prochlorperazine (COMPAZINE) 10 MG tablet Take 10 mg by mouth every 6 (six) hours as needed for nausea or vomiting.     psyllium (METAMUCIL) 58.6 % packet Take 1-3 packets by mouth at bedtime.     simvastatin (ZOCOR) 40 MG tablet Take 40 mg by mouth at bedtime.    vitamin E (VITAMIN E) 400  UNIT capsule Take 400 Units by mouth daily.         If you experience worsening of your admission symptoms, develop shortness of breath, life threatening emergency, suicidal or homicidal thoughts you must seek medical attention immediately by calling 911 or calling your MD immediately  if symptoms less severe.  You Must read complete instructions/literature along with all the possible adverse reactions/side effects for all the Medicines you take and that have been prescribed to you. Take any new Medicines after you have completely understood and accept all the possible adverse reactions/side effects.   Please note  You were cared for by a hospitalist during your hospital stay. If you have any questions about your discharge medications or the care you received while you were in the hospital after you are discharged, you can call the unit and asked to speak with the hospitalist on call if the hospitalist that took care of you is not available. Once you are discharged, your primary care physician will handle any further medical issues. Please note that NO REFILLS  for any discharge medications will be authorized once you are discharged, as it is imperative that you return to your primary care physician (or establish a relationship with a primary care physician if you do not have one) for your aftercare needs so that they can reassess your need for medications and monitor your lab values. Today   SUBJECTIVE   Feels a lot better. hsuband at bedside  VITAL SIGNS:  Blood pressure 118/53, pulse 104, temperature 98.6 F (37 C), temperature source Oral, resp. rate 20, height '5\' 6"'$  (1.676 m), weight 235 lb (106.595 kg), last menstrual period 02/10/1970, SpO2 98 %.  I/O:   Intake/Output Summary (Last 24 hours) at 01/22/15 1233 Last data filed at 01/22/15 1115  Gross per 24 hour  Intake    360 ml  Output   5150 ml  Net  -4790 ml    PHYSICAL EXAMINATION:  GENERAL:  74 y.o.-year-old patient lying in the bed with no acute distress. obesity EYES: Pupils equal, round, reactive to light and accommodation. No scleral icterus. Extraocular muscles intact.  HEENT: Head atraumatic, normocephalic. Oropharynx and nasopharynx clear. Acquired alopecia NECK:  Supple, no jugular venous distention. No thyroid enlargement, no tenderness.  LUNGS: Normal breath sounds bilaterally, no wheezing, rales,rhonchi or crepitation. No use of accessory muscles of respiration.  CARDIOVASCULAR: S1, S2 normal. No murmurs, rubs, or gallops.  ABDOMEN: Soft, non-tender, non-distended. Bowel sounds present. No organomegaly or mass.  EXTREMITIES: No pedal edema, cyanosis, or clubbing.  NEUROLOGIC: Cranial nerves II through XII are intact. Muscle strength 5/5 in all extremities. Sensation intact. Gait not checked.  PSYCHIATRIC: The patient is alert and oriented x 3.  SKIN: No obvious rash, lesion, or ulcer.   DATA REVIEW:   CBC   Recent Labs Lab 01/22/15 0410  WBC 8.0  HGB 10.3*  HCT 30.4*  PLT 500*    Chemistries   Recent Labs Lab 01/21/15 0310 01/22/15 0410  NA 139 138   K 3.9 3.3*  CL 110 106  CO2 22 27  GLUCOSE 136* 98  BUN 13 23*  CREATININE 0.67 0.90  CALCIUM 9.0 9.0  AST 73*  --   ALT 72*  --   ALKPHOS 169*  --   BILITOT 0.7  --     Microbiology Results   Recent Results (from the past 240 hour(s))  MRSA PCR Screening     Status: None   Collection Time: 01/21/15  8:46 AM  Result Value Ref Range Status   MRSA by PCR NEGATIVE NEGATIVE Final    Comment:        The GeneXpert MRSA Assay (FDA approved for NASAL specimens only), is one component of a comprehensive MRSA colonization surveillance program. It is not intended to diagnose MRSA infection nor to guide or monitor treatment for MRSA infections.     RADIOLOGY:  Dg Chest 2 View  01/22/2015  CLINICAL DATA:  Recent discharge from the hospital having completed chemotherapy for Hodgkin's lymphoma, new onset of shortness of breath, chest tightness, and burning sensation. EXAM: CHEST  2 VIEW COMPARISON:  CT scan of the chest of January 21, 2015 and chest x-ray of the same day FINDINGS: The lungs are adequately inflated. There are small bilateral pleural effusions layering inferiorly. There is atelectasis or developing infiltrate at the left lung base. There is no alveolar infiltrate or pneumothorax. The heart is mildly enlarged. The pulmonary vascularity is normal. The power port appliance tip projects over the midportion of the SVC. There is mild multilevel degenerative disc disease of the thoracic spine with calcification of portions of the anterior longitudinal ligament. IMPRESSION: Small bilateral pleural effusions. Left lower lobe atelectasis or pneumonia. There no pulmonary vascular congestion. Electronically Signed   By: David  Martinique M.D.   On: 01/22/2015 08:49   Ct Angio Chest Pe W/cm &/or Wo Cm  01/21/2015  CLINICAL DATA:  Acute onset of respiratory distress. Tachypnea. Initial encounter. EXAM: CT ANGIOGRAPHY CHEST WITH CONTRAST TECHNIQUE: Multidetector CT imaging of the chest was  performed using the standard protocol during bolus administration of intravenous contrast. Multiplanar CT image reconstructions and MIPs were obtained to evaluate the vascular anatomy. CONTRAST:  173m OMNIPAQUE IOHEXOL 350 MG/ML SOLN COMPARISON:  Chest radiograph performed earlier today at 3:03 a.m., and PET/CT performed 12/13/2014 FINDINGS: There is no evidence of pulmonary embolus. Small bilateral pleural effusions are noted. Interstitial prominence is noted, raising concern for mild interstitial edema. Mild bibasilar atelectasis is seen. There is no evidence of pneumothorax. No masses are identified; no abnormal focal contrast enhancement is seen. Diffuse coronary artery calcifications are seen. Vague nonspecific soft tissue inflammation is noted about the distal trachea and azygoesophageal region. No pericardial effusion is identified. No mediastinal lymphadenopathy is seen. The great vessels are grossly unremarkable in appearance. No axillary lymphadenopathy is seen. The visualized portions of the thyroid gland are unremarkable in appearance. A right-sided chest port is noted ending about the distal SVC. The visualized portions of the liver and spleen are unremarkable. No acute osseous abnormalities are seen. There is mild chronic osseous fusion at T6-T7. Review of the MIP images confirms the above findings. IMPRESSION: 1. No evidence of pulmonary embolus. 2. Small bilateral pleural effusions noted. Interstitial prominence, raising concern for mild interstitial edema. Mild bibasilar atelectasis seen. 3. Diffuse coronary artery calcification noted. 4. Vague nonspecific soft tissue inflammation about the distal trachea and azygoesophageal region. Electronically Signed   By: JGarald BaldingM.D.   On: 01/21/2015 06:58   Dg Chest Port 1 View  01/21/2015  CLINICAL DATA:  Acute onset of respiratory distress. Initial encounter. EXAM: PORTABLE CHEST 1 VIEW COMPARISON:  Chest radiograph performed 12/30/2014  FINDINGS: The lungs are well-aerated. Vascular congestion is noted. Bibasilar airspace opacities raise concern for mild pulmonary edema. Small bilateral pleural effusions are suspected. No pneumothorax is seen. There is no evidence of pneumothorax. The cardiomediastinal silhouette is mildly enlarged. A right-sided chest port is noted ending about the mid SVC. No  acute osseous abnormalities are seen. IMPRESSION: Vascular congestion and mild cardiomegaly. Bibasilar airspace opacities raise concern for mild pulmonary edema. Small bilateral pleural effusions suspected. Electronically Signed   By: Garald Balding M.D.   On: 01/21/2015 03:22     Management plans discussed with the patient, family and they are in agreement.  CODE STATUS:     Code Status Orders        Start     Ordered   01/21/15 0905  Full code   Continuous     01/21/15 0904      TOTAL TIME TAKING CARE OF THIS PATIENT: 40 minutes.    Amyjo Mizrachi M.D on 01/22/2015 at 12:33 PM  Between 7am to 6pm - Pager - [phone] After 6pm go to [email or web address] - password [email or web address] [facility]  [facility]  Office  [phone]  CC: Primary care physician; [name], MD

## 2015-01-22 NOTE — Telephone Encounter (Signed)
Spouse Elenore Rota called reporting "Diamond Boyd is admitted to [facility].  Called EMS Saturday night due to trouble breathing.  She had 4 Liters drawn from her lungs.  She is for Neulasta injection this morning and can't get there.  Could Dr. Alvy Bimler call the Hospital in [place] and order the injection there?"  Will notify Dr. Alvy Bimler of patient's admission.  Reports doctors may discharge her today but he has not gone to hospital yet.

## 2015-01-22 NOTE — Care Management (Signed)
Patient admitted from home with acute respiratory distress-secondary to acute pulmonary edema.  History of hodgkin's lymphoma.  Patient lives at home with her husband who provides her with transportation.  Patient obtains her medications from [facility] on [address].  Patient has a walker , BSC, and shower chair at home.  She only uses theses on an as needed basis.  Patient is currently on acute O2.  PT is recommending Home Health PT.  If indicated at the time of discharge patient would like to use [facility]. [facility] rep is reviewing chart to see if she would qualify for a [facility] referral.   If patient requires O2 at time of discharge patient will need qualifying O2 sats and diagnosis.

## 2015-01-22 NOTE — Progress Notes (Signed)
Patient d/c'd home. Education provided, no questions at this time. Patient to be picked up by husband. Telemetry removed. Wilnette Kales

## 2015-01-23 ENCOUNTER — Emergency Department (HOSPITAL_COMMUNITY): Payer: Medicare Other

## 2015-01-23 ENCOUNTER — Other Ambulatory Visit: Payer: Self-pay | Admitting: Hematology and Oncology

## 2015-01-23 ENCOUNTER — Other Ambulatory Visit: Payer: Self-pay

## 2015-01-23 ENCOUNTER — Inpatient Hospital Stay (HOSPITAL_COMMUNITY)
Admission: EM | Admit: 2015-01-23 | Discharge: 2015-01-27 | DRG: 287 | Disposition: A | Discharge status: Home Health | Payer: Medicare Other | Attending: Internal Medicine | Admitting: Internal Medicine

## 2015-01-23 ENCOUNTER — Ambulatory Visit (HOSPITAL_BASED_OUTPATIENT_CLINIC_OR_DEPARTMENT_OTHER): Payer: Medicare Other | Admitting: Hematology and Oncology

## 2015-01-23 ENCOUNTER — Encounter: Payer: Self-pay | Admitting: Hematology and Oncology

## 2015-01-23 ENCOUNTER — Encounter (HOSPITAL_COMMUNITY): Payer: Self-pay | Admitting: Neurology

## 2015-01-23 ENCOUNTER — Telehealth: Payer: Self-pay | Admitting: *Deleted

## 2015-01-23 ENCOUNTER — Ambulatory Visit: Payer: Medicare Other

## 2015-01-23 VITALS — BP 116/55 | HR 99 | Temp 98.8°F | Resp 20 | Ht 68.0 in | Wt 233.4 lb

## 2015-01-23 DIAGNOSIS — I509 Heart failure, unspecified: Secondary | ICD-10-CM

## 2015-01-23 DIAGNOSIS — D649 Anemia, unspecified: Secondary | ICD-10-CM | POA: Diagnosis present

## 2015-01-23 DIAGNOSIS — Z881 Allergy status to other antibiotic agents status: Secondary | ICD-10-CM | POA: Diagnosis not present

## 2015-01-23 DIAGNOSIS — I2510 Atherosclerotic heart disease of native coronary artery without angina pectoris: Secondary | ICD-10-CM | POA: Diagnosis present

## 2015-01-23 DIAGNOSIS — Z8249 Family history of ischemic heart disease and other diseases of the circulatory system: Secondary | ICD-10-CM | POA: Diagnosis not present

## 2015-01-23 DIAGNOSIS — Z8571 Personal history of Hodgkin lymphoma: Secondary | ICD-10-CM

## 2015-01-23 DIAGNOSIS — K219 Gastro-esophageal reflux disease without esophagitis: Secondary | ICD-10-CM | POA: Diagnosis not present

## 2015-01-23 DIAGNOSIS — Y92019 Unspecified place in single-family (private) house as the place of occurrence of the external cause: Secondary | ICD-10-CM | POA: Diagnosis not present

## 2015-01-23 DIAGNOSIS — T502X5A Adverse effect of carbonic-anhydrase inhibitors, benzothiadiazides and other diuretics, initial encounter: Secondary | ICD-10-CM | POA: Diagnosis present

## 2015-01-23 DIAGNOSIS — T451X5A Adverse effect of antineoplastic and immunosuppressive drugs, initial encounter: Secondary | ICD-10-CM | POA: Diagnosis present

## 2015-01-23 DIAGNOSIS — Z85038 Personal history of other malignant neoplasm of large intestine: Secondary | ICD-10-CM | POA: Diagnosis present

## 2015-01-23 DIAGNOSIS — Z885 Allergy status to narcotic agent status: Secondary | ICD-10-CM

## 2015-01-23 DIAGNOSIS — Z882 Allergy status to sulfonamides status: Secondary | ICD-10-CM

## 2015-01-23 DIAGNOSIS — R0602 Shortness of breath: Secondary | ICD-10-CM | POA: Diagnosis not present

## 2015-01-23 DIAGNOSIS — I4891 Unspecified atrial fibrillation: Secondary | ICD-10-CM | POA: Insufficient documentation

## 2015-01-23 DIAGNOSIS — E860 Dehydration: Secondary | ICD-10-CM | POA: Diagnosis present

## 2015-01-23 DIAGNOSIS — Z880 Allergy status to penicillin: Secondary | ICD-10-CM

## 2015-01-23 DIAGNOSIS — D72819 Decreased white blood cell count, unspecified: Secondary | ICD-10-CM | POA: Diagnosis present

## 2015-01-23 DIAGNOSIS — Z8572 Personal history of non-Hodgkin lymphomas: Secondary | ICD-10-CM | POA: Diagnosis not present

## 2015-01-23 DIAGNOSIS — J9600 Acute respiratory failure, unspecified whether with hypoxia or hypercapnia: Secondary | ICD-10-CM | POA: Diagnosis not present

## 2015-01-23 DIAGNOSIS — Z85828 Personal history of other malignant neoplasm of skin: Secondary | ICD-10-CM | POA: Diagnosis not present

## 2015-01-23 DIAGNOSIS — Z886 Allergy status to analgesic agent status: Secondary | ICD-10-CM

## 2015-01-23 DIAGNOSIS — R7989 Other specified abnormal findings of blood chemistry: Secondary | ICD-10-CM | POA: Diagnosis not present

## 2015-01-23 DIAGNOSIS — E039 Hypothyroidism, unspecified: Secondary | ICD-10-CM | POA: Diagnosis present

## 2015-01-23 DIAGNOSIS — G620 Drug-induced polyneuropathy: Secondary | ICD-10-CM | POA: Diagnosis present

## 2015-01-23 DIAGNOSIS — Z7722 Contact with and (suspected) exposure to environmental tobacco smoke (acute) (chronic): Secondary | ICD-10-CM | POA: Diagnosis present

## 2015-01-23 DIAGNOSIS — R931 Abnormal findings on diagnostic imaging of heart and coronary circulation: Secondary | ICD-10-CM | POA: Diagnosis not present

## 2015-01-23 DIAGNOSIS — N179 Acute kidney failure, unspecified: Secondary | ICD-10-CM | POA: Diagnosis not present

## 2015-01-23 DIAGNOSIS — E876 Hypokalemia: Secondary | ICD-10-CM | POA: Diagnosis present

## 2015-01-23 DIAGNOSIS — I739 Peripheral vascular disease, unspecified: Secondary | ICD-10-CM | POA: Diagnosis present

## 2015-01-23 DIAGNOSIS — C8228 Follicular lymphoma grade III, unspecified, lymph nodes of multiple sites: Secondary | ICD-10-CM | POA: Diagnosis present

## 2015-01-23 DIAGNOSIS — I248 Other forms of acute ischemic heart disease: Secondary | ICD-10-CM | POA: Diagnosis present

## 2015-01-23 DIAGNOSIS — I10 Essential (primary) hypertension: Secondary | ICD-10-CM

## 2015-01-23 DIAGNOSIS — I5040 Unspecified combined systolic (congestive) and diastolic (congestive) heart failure: Secondary | ICD-10-CM | POA: Diagnosis not present

## 2015-01-23 DIAGNOSIS — R778 Other specified abnormalities of plasma proteins: Secondary | ICD-10-CM | POA: Insufficient documentation

## 2015-01-23 DIAGNOSIS — I110 Hypertensive heart disease with heart failure: Secondary | ICD-10-CM | POA: Diagnosis present

## 2015-01-23 DIAGNOSIS — Z7901 Long term (current) use of anticoagulants: Secondary | ICD-10-CM | POA: Diagnosis not present

## 2015-01-23 DIAGNOSIS — F329 Major depressive disorder, single episode, unspecified: Secondary | ICD-10-CM | POA: Diagnosis not present

## 2015-01-23 DIAGNOSIS — F32A Depression, unspecified: Secondary | ICD-10-CM | POA: Diagnosis present

## 2015-01-23 DIAGNOSIS — I5022 Chronic systolic (congestive) heart failure: Secondary | ICD-10-CM | POA: Diagnosis present

## 2015-01-23 DIAGNOSIS — Z888 Allergy status to other drugs, medicaments and biological substances status: Secondary | ICD-10-CM

## 2015-01-23 DIAGNOSIS — Z5189 Encounter for other specified aftercare: Secondary | ICD-10-CM

## 2015-01-23 DIAGNOSIS — Z86010 Personal history of colonic polyps: Secondary | ICD-10-CM

## 2015-01-23 DIAGNOSIS — E785 Hyperlipidemia, unspecified: Secondary | ICD-10-CM | POA: Diagnosis present

## 2015-01-23 DIAGNOSIS — D630 Anemia in neoplastic disease: Secondary | ICD-10-CM | POA: Diagnosis not present

## 2015-01-23 DIAGNOSIS — I5041 Acute combined systolic (congestive) and diastolic (congestive) heart failure: Secondary | ICD-10-CM | POA: Diagnosis not present

## 2015-01-23 DIAGNOSIS — E7800 Pure hypercholesterolemia, unspecified: Secondary | ICD-10-CM

## 2015-01-23 LAB — BASIC METABOLIC PANEL
Anion gap: 12 (ref 5–15)
BUN: 21 mg/dL — AB (ref 6–20)
CHLORIDE: 106 mmol/L (ref 101–111)
CO2: 20 mmol/L — ABNORMAL LOW (ref 22–32)
CREATININE: 1.44 mg/dL — AB (ref 0.44–1.00)
Calcium: 10.2 mg/dL (ref 8.9–10.3)
GFR calc Af Amer: 40 mL/min — ABNORMAL LOW (ref >60)
GFR calc non Af Amer: 35 mL/min — ABNORMAL LOW (ref >60)
GLUCOSE: 116 mg/dL — AB (ref 65–99)
POTASSIUM: 3.3 mmol/L — AB (ref 3.5–5.1)
SODIUM: 138 mmol/L (ref 135–145)

## 2015-01-23 LAB — CBC
HCT: 29.5 % — ABNORMAL LOW (ref 36.0–46.0)
Hemoglobin: 10 g/dL — ABNORMAL LOW (ref 12.0–15.0)
MCH: 30.8 pg (ref 26.0–34.0)
MCHC: 33.9 g/dL (ref 30.0–36.0)
MCV: 90.8 fL (ref 78.0–100.0)
PLATELETS: 476 10*3/uL — AB (ref 150–400)
RBC: 3.25 MIL/uL — ABNORMAL LOW (ref 3.87–5.11)
RDW: 16 % — ABNORMAL HIGH (ref 11.5–15.5)
WBC: 17.2 10*3/uL — AB (ref 4.0–10.5)

## 2015-01-23 LAB — TROPONIN I: TROPONIN I: 0.06 ng/mL — AB (ref <0.031)

## 2015-01-23 LAB — I-STAT TROPONIN, ED: TROPONIN I, POC: 0.08 ng/mL (ref 0.00–0.08)

## 2015-01-23 LAB — TSH: TSH: 7.24 u[IU]/mL — AB (ref 0.350–4.500)

## 2015-01-23 LAB — MAGNESIUM: Magnesium: 2 mg/dL (ref 1.7–2.4)

## 2015-01-23 LAB — BRAIN NATRIURETIC PEPTIDE: B Natriuretic Peptide: 622.7 pg/mL — ABNORMAL HIGH (ref 0.0–100.0)

## 2015-01-23 MED ORDER — CALCIUM CARBONATE-VITAMIN D 500-200 MG-UNIT PO TABS
1.0000 | ORAL_TABLET | Freq: Every day | ORAL | Status: DC
  Administered 2015-01-23 – 2015-01-26 (×4): 1 via ORAL
  Filled 2015-01-23 (×4): qty 1

## 2015-01-23 MED ORDER — HEPARIN BOLUS VIA INFUSION
4500.0000 [IU] | Freq: Once | INTRAVENOUS | Status: AC
  Administered 2015-01-23: 4500 [IU] via INTRAVENOUS
  Filled 2015-01-23: qty 4500

## 2015-01-23 MED ORDER — OMEGA-3 350 MG PO CAPS: 1.0000 | ORAL_CAPSULE | Freq: Every day | ORAL | Status: DC

## 2015-01-23 MED ORDER — VITAMIN D 1000 UNITS PO TABS
1000.0000 [IU] | ORAL_TABLET | Freq: Every day | ORAL | Status: DC
  Administered 2015-01-23 – 2015-01-27 (×5): 1000 [IU] via ORAL
  Filled 2015-01-23 (×5): qty 1

## 2015-01-23 MED ORDER — ACETAMINOPHEN 650 MG RE SUPP: 650.0000 mg | Freq: Four times a day (QID) | RECTAL | Status: DC | PRN

## 2015-01-23 MED ORDER — PANTOPRAZOLE SODIUM 40 MG PO TBEC
40.0000 mg | DELAYED_RELEASE_TABLET | Freq: Every day | ORAL | Status: DC
  Administered 2015-01-23 – 2015-01-27 (×5): 40 mg via ORAL
  Filled 2015-01-23 (×5): qty 1

## 2015-01-23 MED ORDER — ACYCLOVIR 400 MG PO TABS
400.0000 mg | ORAL_TABLET | Freq: Every day | ORAL | Status: DC
  Administered 2015-01-23 – 2015-01-27 (×5): 400 mg via ORAL
  Filled 2015-01-23 (×5): qty 1

## 2015-01-23 MED ORDER — LORATADINE 10 MG PO TABS
10.0000 mg | ORAL_TABLET | Freq: Every day | ORAL | Status: DC
  Administered 2015-01-23 – 2015-01-27 (×5): 10 mg via ORAL
  Filled 2015-01-23 (×5): qty 1

## 2015-01-23 MED ORDER — METOPROLOL TARTRATE 1 MG/ML IV SOLN
2.5000 mg | Freq: Four times a day (QID) | INTRAVENOUS | Status: DC
  Administered 2015-01-24 – 2015-01-26 (×7): 2.5 mg via INTRAVENOUS
  Filled 2015-01-23 (×8): qty 5

## 2015-01-23 MED ORDER — ESTRADIOL 1 MG PO TABS
1.0000 mg | ORAL_TABLET | Freq: Every day | ORAL | Status: DC
  Administered 2015-01-23 – 2015-01-27 (×5): 1 mg via ORAL
  Filled 2015-01-23 (×5): qty 1

## 2015-01-23 MED ORDER — ONDANSETRON HCL 4 MG PO TABS: 4.0000 mg | ORAL_TABLET | Freq: Four times a day (QID) | ORAL | Status: DC | PRN

## 2015-01-23 MED ORDER — SODIUM CHLORIDE 0.9 % IV SOLN
INTRAVENOUS | Status: AC
  Administered 2015-01-23: 1000 mL via INTRAVENOUS

## 2015-01-23 MED ORDER — OMEGA-3-ACID ETHYL ESTERS 1 G PO CAPS
1.0000 g | ORAL_CAPSULE | Freq: Every day | ORAL | Status: DC
  Administered 2015-01-23 – 2015-01-27 (×5): 1 g via ORAL
  Filled 2015-01-23 (×5): qty 1

## 2015-01-23 MED ORDER — DOCUSATE SODIUM 100 MG PO CAPS
100.0000 mg | ORAL_CAPSULE | Freq: Every day | ORAL | Status: DC
Start: 2015-01-23 — End: 2015-01-27
  Administered 2015-01-23 – 2015-01-26 (×3): 100 mg via ORAL
  Filled 2015-01-23 (×3): qty 1

## 2015-01-23 MED ORDER — ONDANSETRON HCL 4 MG/2ML IJ SOLN
4.0000 mg | Freq: Four times a day (QID) | INTRAMUSCULAR | Status: DC | PRN
  Administered 2015-01-23: 4 mg via INTRAVENOUS
  Filled 2015-01-23: qty 2

## 2015-01-23 MED ORDER — SIMVASTATIN 40 MG PO TABS
40.0000 mg | ORAL_TABLET | Freq: Every day | ORAL | Status: DC
  Administered 2015-01-23 – 2015-01-26 (×4): 40 mg via ORAL
  Filled 2015-01-23 (×4): qty 1

## 2015-01-23 MED ORDER — PROCHLORPERAZINE MALEATE 10 MG PO TABS
10.0000 mg | ORAL_TABLET | Freq: Four times a day (QID) | ORAL | Status: DC | PRN
  Filled 2015-01-23: qty 1

## 2015-01-23 MED ORDER — ASPIRIN EC 81 MG PO TBEC
81.0000 mg | DELAYED_RELEASE_TABLET | Freq: Every day | ORAL | Status: DC
  Administered 2015-01-23 – 2015-01-27 (×4): 81 mg via ORAL
  Filled 2015-01-23 (×4): qty 1

## 2015-01-23 MED ORDER — ACETAMINOPHEN 325 MG PO TABS
650.0000 mg | ORAL_TABLET | Freq: Four times a day (QID) | ORAL | Status: DC | PRN
  Administered 2015-01-23 – 2015-01-26 (×4): 650 mg via ORAL
  Filled 2015-01-23 (×4): qty 2

## 2015-01-23 MED ORDER — GABAPENTIN 300 MG PO CAPS
300.0000 mg | ORAL_CAPSULE | Freq: Two times a day (BID) | ORAL | Status: DC
  Administered 2015-01-23 – 2015-01-27 (×8): 300 mg via ORAL
  Filled 2015-01-23 (×8): qty 1

## 2015-01-23 MED ORDER — PSYLLIUM 95 % PO PACK
1.0000 | PACK | Freq: Every day | ORAL | Status: DC
  Administered 2015-01-24: 1 via ORAL
  Filled 2015-01-23 (×2): qty 1

## 2015-01-23 MED ORDER — METOPROLOL TARTRATE 1 MG/ML IV SOLN
5.0000 mg | Freq: Once | INTRAVENOUS | Status: AC
  Administered 2015-01-23: 5 mg via INTRAVENOUS
  Filled 2015-01-23: qty 5

## 2015-01-23 MED ORDER — LIDOCAINE-PRILOCAINE 2.5-2.5 % EX CREA: 1.0000 "application " | TOPICAL_CREAM | CUTANEOUS | Status: DC | PRN

## 2015-01-23 MED ORDER — FLUOXETINE HCL 20 MG PO CAPS
20.0000 mg | ORAL_CAPSULE | Freq: Every day | ORAL | Status: DC
  Administered 2015-01-23 – 2015-01-27 (×5): 20 mg via ORAL
  Filled 2015-01-23 (×5): qty 1

## 2015-01-23 MED ORDER — POLYETHYLENE GLYCOL 3350 17 G PO PACK
17.0000 g | PACK | Freq: Every day | ORAL | Status: DC
  Administered 2015-01-23 – 2015-01-26 (×2): 17 g via ORAL
  Filled 2015-01-23 (×2): qty 1

## 2015-01-23 MED ORDER — SODIUM CHLORIDE 0.9 % IJ SOLN
10.0000 mL | INTRAMUSCULAR | Status: DC | PRN
  Administered 2015-01-27: 10 mL
  Filled 2015-01-23: qty 40

## 2015-01-23 MED ORDER — HEPARIN (PORCINE) IN NACL 100-0.45 UNIT/ML-% IJ SOLN
1300.0000 [IU]/h | INTRAMUSCULAR | Status: DC
  Administered 2015-01-23 – 2015-01-25 (×3): 1300 [IU]/h via INTRAVENOUS
  Filled 2015-01-23 (×3): qty 250

## 2015-01-23 MED ORDER — VITAMIN E 180 MG (400 UNIT) PO CAPS
400.0000 [IU] | ORAL_CAPSULE | Freq: Every day | ORAL | Status: DC
Start: 2015-01-23 — End: 2015-01-27
  Administered 2015-01-23 – 2015-01-27 (×5): 400 [IU] via ORAL
  Filled 2015-01-23 (×5): qty 1

## 2015-01-23 MED ORDER — ALTEPLASE 2 MG IJ SOLR
2.0000 mg | Freq: Once | INTRAMUSCULAR | Status: AC
  Administered 2015-01-23: 2 mg
  Filled 2015-01-23: qty 2

## 2015-01-23 MED ORDER — CALCIUM CARBONATE-VITAMIN D 600-400 MG-UNIT PO TABS: 1.0000 | ORAL_TABLET | Freq: Every evening | ORAL | Status: DC

## 2015-01-23 MED ORDER — SODIUM CHLORIDE 0.9 % IJ SOLN
3.0000 mL | Freq: Two times a day (BID) | INTRAMUSCULAR | Status: DC
  Administered 2015-01-24: 3 mL via INTRAVENOUS

## 2015-01-23 MED ORDER — FLUTICASONE PROPIONATE 50 MCG/ACT NA SUSP
2.0000 | Freq: Every day | NASAL | Status: DC
  Administered 2015-01-23 – 2015-01-26 (×4): 2 via NASAL
  Filled 2015-01-23: qty 16

## 2015-01-23 MED ORDER — LEVOTHYROXINE SODIUM 112 MCG PO TABS
112.0000 ug | ORAL_TABLET | Freq: Every day | ORAL | Status: DC
  Administered 2015-01-25 – 2015-01-27 (×3): 112 ug via ORAL
  Filled 2015-01-23 (×3): qty 1

## 2015-01-23 MED ORDER — SODIUM CHLORIDE 0.9 % IJ SOLN
10.0000 mL | Freq: Two times a day (BID) | INTRAMUSCULAR | Status: DC
  Administered 2015-01-24 – 2015-01-26 (×4): 10 mL

## 2015-01-23 MED ORDER — POTASSIUM CHLORIDE CRYS ER 20 MEQ PO TBCR
20.0000 meq | EXTENDED_RELEASE_TABLET | Freq: Every day | ORAL | Status: DC
  Administered 2015-01-23 – 2015-01-24 (×2): 20 meq via ORAL
  Filled 2015-01-23 (×2): qty 1

## 2015-01-23 MED ORDER — SODIUM CHLORIDE 0.9 % IV BOLUS (SEPSIS): 500.0000 mL | Freq: Once | INTRAVENOUS | Status: DC

## 2015-01-23 MED ORDER — PEGFILGRASTIM INJECTION 6 MG/0.6ML
6.0000 mg | Freq: Once | SUBCUTANEOUS | Status: AC
  Administered 2015-01-23: 6 mg via SUBCUTANEOUS
  Filled 2015-01-23: qty 0.6

## 2015-01-23 NOTE — ED Provider Notes (Signed)
Face-to-face evaluation   History: She presents for evaluation of shortness of breath, which was noticed last night while she was sleeping. She went to her oncologist office today for an infusion, and while there was found to have A. fib, which was felt to be new onset, and trouble with a rate over 100 beats per minutes. 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 She was admitted to the hospital several days ago at [facility], and found to have digestive heart failure which was treated with a diuresis. She denies current chest pain, shortness of breath, nausea, vomiting or fever.  Physical exam: Alert, elderly female in mild distress. Regular rhythm, normal rate. Lungs clear. Extremities, mild edema.  16:37- EKG repeated and shows persistent axis change from prior, and now increased heart rate to 112, with atrial fibrillation. No ischemia.  Medications  metoprolol (LOPRESSOR) injection 5 mg (not administered)  sodium chloride 0.9 % bolus 500 mL (not administered)    Patient Vitals for the past 24 hrs:  BP Temp Temp src Pulse Resp SpO2  01/23/15 1615 125/72 mmHg - - 106 21 97 %  01/23/15 1600 129/64 mmHg - - - 21 97 %  01/23/15 1545 120/71 mmHg - - 97 21 98 %  01/23/15 1530 - - - 97 22 98 %  01/23/15 1515 131/76 mmHg - - 95 22 99 %  01/23/15 1500 119/62 mmHg - - 106 19 98 %  01/23/15 1445 138/67 mmHg - - 114 19 97 %  01/23/15 1430 154/77 mmHg - - 96 17 94 %  01/23/15 1415 129/70 mmHg - - 95 18 96 %  01/23/15 1212 136/91 mmHg 98.3 F (36.8 C) Oral (!) 124 18 100 %    5:01 PM Reevaluation with update and discussion. After initial assessment and treatment, an updated evaluation reveals heart rate persistently tachycardic. Will treat with Lopressor. Findings discussed with patient, family members, all questions were answered. Claritza July L    CRITICAL CARE Performed by: Daleen Bo L Total  critical care time: 35 minutes Critical care time was exclusive of separately billable procedures and treating other patients. Critical care was necessary to treat or prevent imminent or life-threatening deterioration. Critical care was time spent personally by me on the following activities: development of treatment plan with patient and/or surrogate as well as nursing, discussions with consultants, evaluation of patient's response to treatment, examination of patient, obtaining history from patient or surrogate, ordering and performing treatments and interventions, ordering and review of laboratory studies, ordering and review of radiographic studies, pulse oximetry and re-evaluation of patient's condition.   EKG Interpretation  Date/Time:  Tuesday January 23 2015 12:11:31 EST Ventricular Rate:  129 PR Interval:    QRS Duration: 126 QT Interval:  370 QTC Calculation: 542 R Axis:   100 Text Interpretation:  Atrial fibrillation with rapid ventricular response Rightward axis Non-specific intra-ventricular conduction block Abnormal ECG Since last tracing R axis is shifted Confirmed by Eulis Foster  MD, Britta Louth [phone]) on 01/23/2015 3:06:42 PM       EKG Interpretation  Date/Time:  Tuesday January 23 2015 16:37:49 EST Ventricular Rate:  112 PR Interval:    QRS Duration: 123 QT Interval:  337 QTC Calculation: 460 R Axis:   89 Text Interpretation:  Atrial fibrillation Nonspecific intraventricular conduction delay Consider anterior infarct Borderline repolarization abnormality Since last tracing of earlier today No significant change was found Confirmed by Eulis Foster  MD, Nicodemus Denk ([id number]) on 01/23/2015  4:55:49 PM        Medical screening examination/treatment/procedure(s) were conducted as a shared visit with non-physician practitioner(s) and myself.  I personally evaluated the patient during the encounter  Daleen Bo, MD 01/27/15 0002

## 2015-01-23 NOTE — ED Notes (Signed)
Pt ambulatory to restroom

## 2015-01-23 NOTE — Assessment & Plan Note (Signed)
The patient was recently discharged yesterday from [facility] and was diagnosed with fluid overload, congestive heart failure and cardiomyopathy. Since dismissal from the hospital, she felt unwell. She had one pillow orthopnea overnight and still felt out of breath and profound fatigue. On examination in my office, she was found to have atrial fibrillation with rapid ventricular response. I recommend further evaluation and possible admission to the cardiology unit. I have arranged for her to proceed to [facility] emergency department for further evaluation and possible admission for management of facial fibrillation with rapid ventricular response.

## 2015-01-23 NOTE — Assessment & Plan Note (Signed)
The patient tolerated cycle 2 of treatment poorly and was admitted to the hospital over the weekend for respiratory failure from fluid overload. She was discharged yesterday and still feel unwell. She will receive G-CSF injection today and continue supportive care.

## 2015-01-23 NOTE — ED Notes (Signed)
Attempted report 

## 2015-01-23 NOTE — ED Notes (Signed)
Pt placed on  2 liters for comfort and support

## 2015-01-23 NOTE — Progress Notes (Signed)
[facility] OFFICE PROGRESS NOTE  Patient Care Team: Abner Greenspan, MD as PCP - General Mosetta Anis, MD (Allergy) Fanny Skates, MD as Consulting Physician (General Surgery) Grace Isaac, MD as Consulting Physician (Cardiothoracic Surgery)  SUMMARY OF ONCOLOGIC HISTORY: Oncology History   History of colon cancer   Staging form: Colon and Rectum, AJCC 7th Edition     Clinical: Stage IIA (T3, N0, M0) - Signed by [name], MD on 02/21/2014 History of hodgkin's lymphoma   Staging form: Lymphoid Neoplasms, AJCC 6th Edition     Clinical: Stage IV - Signed by [name], MD on 02/21/2014       History of hodgkin's lymphoma   05/20/2013 Initial Diagnosis Hodgkin lymphoma   06/27/2014 Imaging Interval increase in metabolic activity of several small lymph nodes is concerning for lymphoma recurrence. Lymph nodes include a small right level 2 lymph node, right hilar lymph node, and right paratracheal lymph node   07/07/2014 Pathology Results  limited tissue from ultrasound-guided biopsy was nondiagnostic   07/07/2014 Procedure  ultrasound-guided biopsy was nondiagnostic   09/27/2014 Imaging Repeat PET CT scan show diffuse disease concern for relapse.   10/17/2014 Pathology Results Accession: [id number] Biopsy was nondiagnostic   10/17/2014 Procedure She underwent bronchoscopy & EBUS & biopsy of  LN at Level 10L   11/01/2014 Surgery She underwent cronchoscopy with endobronchial ultrasound, mediastinoscopy and biopsy    11/01/2014 Pathology Results Accession: [id number] biopsy showed no evidence of lymphoma, only granulomas.   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 She is seen urgently today for further follow-up. She feels unwell, with shortness of breath at rest and orthopnea. I review her records. Since dismissal from the hospital last week, she was admitted to [facility] and was diagnosed with respiratory failure with associated cardiomyopathy and congestive heart failure. She feel dizzy, weak with poor appetite. She denies any cough, fevers or chills. She denies chest pain. She has bilateral leg edema.  REVIEW OF SYSTEMS:   Constitutional: Denies fevers, chills or abnormal weight loss Eyes: Denies blurriness of vision Ears, nose, mouth, throat, and face: Denies mucositis or sore throat Cardiovascular: Denies palpitation, chest discomfort  Gastrointestinal:  Denies nausea, heartburn or change in bowel habits Skin: Denies abnormal skin rashes Lymphatics: Denies new lymphadenopathy or easy bruising Neurological:Denies numbness, tingling or new weaknesses Behavioral/Psych: Mood is stable, no new changes  All other systems were reviewed with the patient and are negative.  I have reviewed the past medical history, past surgical history, social history and family  history with the patient and they are unchanged from previous note.  ALLERGIES:  is allergic to  achromycin; allopurinol; astelin; cephalexin; codeine; meloxicam; minocycline; nabumetone; nyquil; penicillins; sulfa antibiotics; zolpidem tartrate; buspar; and ciprofloxacin.  MEDICATIONS:  Current Outpatient Prescriptions  Medication Sig Dispense Refill  . acetaminophen (TYLENOL) 650 MG CR tablet Take 650 mg by mouth every 8 (eight) hours as needed (For back pain.).    Marland Kitchen acyclovir (ZOVIRAX) 400 MG tablet Take 400 mg by mouth daily.    . Calcium Carbonate-Vitamin D (CALCIUM 600+D) 600-400 MG-UNIT per tablet Take 1 tablet by mouth every evening.     . celecoxib (CELEBREX) 200 MG capsule Take 200 mg by mouth 2 (two) times daily.    . Cholecalciferol (VITAMIN D-3) 1000 UNITS CAPS Take 1,000 Units by mouth daily.    Marland Kitchen docusate sodium (COLACE) 100 MG capsule Take 100 mg by mouth at bedtime.    Marland Kitchen estradiol (ESTRACE) 1 MG tablet Take 1 mg by mouth daily.    Marland Kitchen FLUoxetine (PROZAC) 20 MG capsule Take 20 mg by mouth daily.    . fluticasone (FLONASE) 50 MCG/ACT nasal spray Place 2 sprays into both nostrils daily.    . furosemide (LASIX) 20 MG tablet Take 1 tablet (20 mg total) by mouth daily. 30 tablet 0  . gabapentin (NEURONTIN) 300 MG capsule Take 300 mg by mouth 2 (two) times daily.    Marland Kitchen levothyroxine (SYNTHROID, LEVOTHROID) 112 MCG tablet Take 112 mcg by mouth daily before breakfast.    . lidocaine-prilocaine (EMLA) cream Apply 1 application topically as needed (For port-a-cath.).    Marland Kitchen loratadine (CLARITIN) 10 MG tablet Take 10 mg by mouth daily.    . Omega-3 350 MG CAPS Take 1 capsule by mouth daily.    Marland Kitchen omeprazole (PRILOSEC) 40 MG capsule Take 40 mg by mouth daily.    . ondansetron (ZOFRAN) 8 MG tablet Take 8 mg by mouth every 6 (six) hours as needed for nausea or vomiting.     . polyethylene glycol (MIRALAX / GLYCOLAX) packet Take 17 g by mouth at bedtime.    . potassium chloride SA (K-DUR,KLOR-CON) 20 MEQ tablet Take 1 tablet (20 mEq total) by mouth daily. 30 tablet 0  . psyllium (METAMUCIL)  58.6 % packet Take 1-3 packets by mouth at bedtime.     . simvastatin (ZOCOR) 40 MG tablet Take 40 mg by mouth at bedtime.    . vitamin E (VITAMIN E) 400 UNIT capsule Take 400 Units by mouth daily.     . prochlorperazine (COMPAZINE) 10 MG tablet Take 10 mg by mouth every 6 (six) hours as needed for nausea or vomiting.      No current facility-administered medications for this visit.    PHYSICAL EXAMINATION: ECOG PERFORMANCE STATUS: 3 - Symptomatic, >50% confined to bed  Filed Vitals:   01/23/15 1102  BP: 116/55  Pulse: 99  Temp: 98.8 F (37.1 C)  Resp: 20   Filed Weights   01/23/15 1102  Weight: 233 lb 6.4 oz (105.87 kg)    GENERAL:alert, no distress and comfortable. She looked ill, sitting on the wheelchair SKIN: skin color, texture, turgor are normal, no rashes or significant lesions EYES: normal, Conjunctiva are pink and non-injected, sclera clear OROPHARYNX:no exudate, no erythema and lips, buccal mucosa, and tongue normal  NECK: supple, thyroid normal size, non-tender, without nodularity LYMPH:  no palpable lymphadenopathy in the cervical, axillary or inguinal LUNGS: clear to auscultation and percussion with normal breathing effort HEART: Irregular rate  and rhythm, no murmurs with moderate bilateral lower extremity edema ABDOMEN:abdomen soft, non-tender and normal bowel sounds Musculoskeletal:no cyanosis of digits and no clubbing  NEURO: alert & oriented x 3 with fluent speech, no focal motor/sensory deficits  LABORATORY DATA:  I have reviewed the data as listed    Component Value Date/Time   NA 138 01/22/2015 0410   NA 140 01/17/2015 0806   K 3.3* 01/22/2015 0410   K 3.9 01/17/2015 0806   CL 106 01/22/2015 0410   CL 105 07/14/2012 0939   CO2 27 01/22/2015 0410   CO2 24 01/17/2015 0806   GLUCOSE 98 01/22/2015 0410   GLUCOSE 135 01/17/2015 0806   GLUCOSE 103* 07/14/2012 0939   BUN 23* 01/22/2015 0410   BUN 8.4 01/17/2015 0806   CREATININE 0.90 01/22/2015 0410    CREATININE 0.8 01/17/2015 0806   CALCIUM 9.0 01/22/2015 0410   CALCIUM 9.4 01/17/2015 0806   CALCIUM 9.3 08/09/2009 0000   PROT 7.1 01/21/2015 0310   PROT 6.3* 01/17/2015 0806   ALBUMIN 4.3 01/21/2015 0310   ALBUMIN 3.5 01/17/2015 0806   AST 73* 01/21/2015 0310   AST 22 01/17/2015 0806   ALT 72* 01/21/2015 0310   ALT 18 01/17/2015 0806   ALKPHOS 169* 01/21/2015 0310   ALKPHOS 217* 01/17/2015 0806   BILITOT 0.7 01/21/2015 0310   BILITOT <0.30 01/17/2015 0806   GFRNONAA >60 01/22/2015 0410   GFRAA >60 01/22/2015 0410    No results found for: SPEP, UPEP  Lab Results  Component Value Date   WBC 8.0 01/22/2015   NEUTROABS 5.5 01/21/2015   HGB 10.3* 01/22/2015   HCT 30.4* 01/22/2015   MCV 94.0 01/22/2015   PLT 500* 01/22/2015      Chemistry      Component Value Date/Time   NA 138 01/22/2015 0410   NA 140 01/17/2015 0806   K 3.3* 01/22/2015 0410   K 3.9 01/17/2015 0806   CL 106 01/22/2015 0410   CL 105 07/14/2012 0939   CO2 27 01/22/2015 0410   CO2 24 01/17/2015 0806   BUN 23* 01/22/2015 0410   BUN 8.4 01/17/2015 0806   CREATININE 0.90 01/22/2015 0410   CREATININE 0.8 01/17/2015 0806      Component Value Date/Time   CALCIUM 9.0 01/22/2015 0410   CALCIUM 9.4 01/17/2015 0806   CALCIUM 9.3 08/09/2009 0000   ALKPHOS 169* 01/21/2015 0310   ALKPHOS 217* 01/17/2015 0806   AST 73* 01/21/2015 0310   AST 22 01/17/2015 0806   ALT 72* 01/21/2015 0310   ALT 18 01/17/2015 0806   BILITOT 0.7 01/21/2015 0310   BILITOT <0.30 01/17/2015 0806     Stat EKG my office show atrial fibrillation with rapid ventricular response  RADIOGRAPHIC STUDIES: I have personally reviewed the radiological images as listed and agreed with the findings in the report. Dg Chest 2 View  01/23/2015  CLINICAL DATA:  Shortness of breath for 1 week. History of colon carcinoma and lymphoma EXAM: CHEST  2 VIEW COMPARISON:  January 22, 2015. FINDINGS: Lungs are now clear. Heart is borderline prominent  with pulmonary vascularity within normal limits. Port-A-Cath tip is in the superior vena cava. No adenopathy. No blastic or lytic bone lesions. There is degenerative change in the thoracic spine and each shoulder. IMPRESSION: Lungs are now clear. Small pleural effusions have resolved. No new opacity. Heart borderline prominent but stable. Electronically Signed   By: Lowella Grip III M.D.   On: 01/23/2015 13:26   Dg  Chest 2 View  01/22/2015  CLINICAL DATA:  Recent discharge from the hospital having completed chemotherapy for Hodgkin's lymphoma, new onset of shortness of breath, chest tightness, and burning sensation. EXAM: CHEST  2 VIEW COMPARISON:  CT scan of the chest of January 21, 2015 and chest x-ray of the same day FINDINGS: The lungs are adequately inflated. There are small bilateral pleural effusions layering inferiorly. There is atelectasis or developing infiltrate at the left lung base. There is no alveolar infiltrate or pneumothorax. The heart is mildly enlarged. The pulmonary vascularity is normal. The power port appliance tip projects over the midportion of the SVC. There is mild multilevel degenerative disc disease of the thoracic spine with calcification of portions of the anterior longitudinal ligament. IMPRESSION: Small bilateral pleural effusions. Left lower lobe atelectasis or pneumonia. There no pulmonary vascular congestion. Electronically Signed   By: David  Martinique M.D.   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 She was discharged yesterday and still feel unwell. She will receive G-CSF injection today and continue supportive care.   Atrial fibrillation with rapid ventricular response [facility]) The patient was recently discharged yesterday from [facility] and  was diagnosed with fluid overload, congestive heart failure and cardiomyopathy. Since dismissal from the hospital, she felt unwell. She had one pillow orthopnea overnight and still felt out of breath and profound fatigue. On examination in my office, she was found to have atrial fibrillation with rapid ventricular response. I recommend further evaluation and possible admission to the cardiology unit. I have arranged for her to proceed to [facility] emergency department for further evaluation and possible admission for management of facial fibrillation with rapid ventricular response.   Orders Placed This Encounter  Procedures  . SCHEDULING COMMUNICATION INJECTION    Schedule 15 minute injection appointment  . EKG 12-Lead    Ordered by an unspecified provider    All questions were answered. The patient knows to call the clinic with any problems, questions or concerns. No barriers to learning was detected. I spent 25 minutes counseling the patient face to face. The total time spent in the appointment was 40 minutes and more than 50% was on counseling and review of test results     [facility], [place], MD 01/23/2015 2:29 PM

## 2015-01-23 NOTE — Progress Notes (Signed)
ANTICOAGULATION CONSULT NOTE - Initial Consult  Pharmacy Consult for Heparin Indication: atrial fibrillation  Patient Measurements: Heparin Dosing Weight: 88 kg  Vital Signs: Temp: 99.7 F (37.6 C) (12/13 1821) Temp Source: Oral (12/13 1821) BP: 144/94 mmHg (12/13 1821) Pulse Rate: 104 (12/13 1745)  Labs:  Recent Labs  01/21/15 0310 01/21/15 0804 01/21/15 1436 01/21/15 2121 01/22/15 0410 01/23/15 1602  HGB 11.8*  --   --   --  10.3* 10.0*  HCT 35.2  --   --   --  30.4* 29.5*  PLT 601*  --   --   --  500* 476*  CREATININE 0.67  --   --   --  0.90 1.44*  TROPONINI 0.06* 0.16* 0.38* 0.20*  --   --     Assessment: 41 yoF admitted from oncologist office to [facility] with afib with RVR. PMHx sx for colon cancer/lymphoma currently undergoing chemotherapy treatment and recent admission to OSH for CHF. No anti-coagulation PTA. CBC stable.  Goal of Therapy:  Heparin level 0.3-0.7 units/ml Monitor platelets by anticoagulation protocol: Yes   Plan:  1. Give 4500 units bolus x 1 2. Start heparin infusion at 1300 units/hr 3. Check anti-Xa level in 8 hours and daily while on heparin 4. Continue to monitor H&H and platelets   Thank you for allowing [place] to participate in this patient's care.   Vincenza Hews, PharmD, BCPS 01/23/2015, 6:34 PM Pager: [phone]

## 2015-01-23 NOTE — ED Notes (Signed)
Pt is cancer patient with non-Hodgkin's Lymphoma, last chemo was last week. Was discharged yesterday from [place]. Is here with SOB, AFIB on EKG from PCP office today. Pt is alert, appears sob.

## 2015-01-23 NOTE — H&P (Signed)
[facility] History and Physical  Diamond Boyd MKL:[id number] DOB: [birth year] DOA: 01/23/2015  Referring physician: Ms. Alisia Ferrari, PA EDP PCP: [name], MD  Specialists: Dr. [name], Oncology  Chief Complaint: Shortness of breath  HPI: Diamond Boyd is a 74 y.o. female  With a history of systolic CHF, lymphoma, hypothyroidism presented to the emergency department with complaints of shortness of breath. Patient was at her oncology office visit today when she was found to have atrial fibrillation with RVR. Of note, patient was recently admitted to [facility] hospital last week for chemotherapy. She was then admitted to [facility] for congestive heart failure and was discharged on 01/22/2015.  Per husband, who is at bedside, patient had 7 L of fluid removed during her hospital course. She currently denies any sick contacts, recent travel. She denies any palpitations. She does have shortness of breath with movement. Patient has also complained of decreased appetite. While in the emergency department, patient was noted to have atrial fibrillation on her EKG. Heart rate did rise to 124. Patient was given metoprolol. TR[facility] was called for admission.  Review of Systems:  Constitutional: Denies fever, chills, diaphoresis, appetite change and fatigue.  HEENT: Denies photophobia, eye pain, redness, hearing loss, ear pain, congestion, sore throat, rhinorrhea, sneezing, mouth sores, trouble swallowing, neck pain, neck stiffness and tinnitus.   Respiratory: Complains of some shortness of breath with activity.   Cardiovascular: Denies chest pain, palpitations and leg swelling.  Gastrointestinal: Has occasional constipation. Denies any nausea or vomiting. Genitourinary: Denies dysuria, urgency, frequency, hematuria, flank pain and difficulty urinating.  Musculoskeletal: Denies myalgias, back pain, joint swelling, arthralgias and gait problem.  Skin: Denies pallor, rash and wound.   Neurological: Denies dizziness, seizures, syncope, weakness, light-headedness, numbness and headaches.  Hematological: Denies adenopathy. Easy bruising, personal or family bleeding history  Psychiatric/Behavioral: Denies suicidal ideation, mood changes, confusion, nervousness, sleep disturbance and agitation  Past Medical History  Diagnosis Date  . Colon polyps   . Depression   . Hypothyroidism   . Osteoarthritis     hands  . Peripheral vascular disease ([place])   . Degenerative disk disease     spine in center in past   . 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 Cancer ([place])     skin cancer- basal cell on arm / colon 2011  . Lymphoma ([place])   . Colon cancer ([place])     2011, s/p surgery, in remission  . 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 hodgkins lymphoma   . Heart murmur   . GERD (gastroesophageal reflux disease)   . Anemia     hx blood tx  . Pneumonia   . 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 Hypertension    Past Surgical History  Procedure Laterality Date  . Cholecystectomy    . Splenectomy      lymphoma  . Breast biopsy  1996  . Plantar fascia release    . Ventral hernia repair  1998  . Tendon release      Right thumb   . Colectomy  8/11  . Vaginal hysterectomy    . Bone marrow biopsy  05/27/13  . Cataract extraction, bilateral  2016  . Port-a-cath insertion    . Colonoscopy w/ biopsies    . Dg biopsy lung    . Video bronchoscopy with endobronchial ultrasound N/A 10/17/2014  Procedure: VIDEO BRONCHOSCOPY WITH ENDOBRONCHIAL ULTRASOUND;  Surgeon: Javier Glazier, MD;  Location: [place];  Service: Thoracic;  Laterality: N/A;  . Video bronchoscopy with endobronchial ultrasound N/A 11/01/2014    Procedure: VIDEO BRONCHOSCOPY WITH ENDOBRONCHIAL ULTRASOUND;  Surgeon: Grace Isaac, MD;  Location: [place];  Service: Thoracic;  Laterality: N/A;  .  Mediastinoscopy N/A 11/01/2014    Procedure: MEDIASTINOSCOPY;  Surgeon: Grace Isaac, MD;  Location: [place];  Service: Thoracic;  Laterality: N/A;  . Lymph node biopsy N/A 11/01/2014    Procedure: MEDIASTINAL LYMPH NODE BIOPSY;  Surgeon: Grace Isaac, MD;  Location: [place];  Service: Thoracic;  Laterality: N/A;   Social History:  reports that she has never smoked. She has never used smokeless tobacco. She reports that she does not drink alcohol or use illicit drugs.  Allergies  Allergen Reactions  . Achromycin [Tetracycline Hcl] Other (See Comments)    Pt does not remember reaction  . Allopurinol Other (See Comments)    REACTION: Unsure of reaction happene years ago  . Astelin [Azelastine Hcl] Other (See Comments)    Reaction unknown  . Cephalexin Other (See Comments)    REACTION: unsure of reaction happened yrs ago.  . Codeine Other (See Comments)    REACTION: abd. pain  . Meloxicam Other (See Comments)    REACTION: GI symptoms  . Minocycline Other (See Comments)    Abdominal pain  . Nabumetone Other (See Comments)    REACTION: reaction not known  . Nyquil [Pseudoeph-Doxylamine-Dm-Apap] Hives  . Penicillins Other (See Comments)    Reaction unknown Has patient had a PCN reaction causing immediate rash, facial/tongue/throat swelling, SOB or lightheadedness with hypotension: unsure reaction unknown Has patient had a PCN reaction causing severe rash involving mucus membranes or skin necrosis: unsure reaction unknown Has patient had a PCN reaction that required hospitalization no Has patient had a PCN reaction occurring within the last 10 years: no If all of the above answers are "NO", then may proceed with Cephalosporin use.  . Sulfa Antibiotics Other (See Comments)    Gi side eff   . Zolpidem Tartrate Other (See Comments)    REACTION: feels too drugged  . Buspar [Buspirone Hcl] Other (See Comments)    Dizziness, and not as effective for anxiety  . Ciprofloxacin Rash     Family History  Problem Relation Age of Onset  . Coronary artery disease Mother   . Alcohol abuse Mother   . Diabetes Mother   . Diabetes Brother   . Fibromyalgia Daughter     chronic pain   . COPD Daughter   . Colon cancer Neg Hx   . Colon polyps Neg Hx   . Stomach cancer Neg Hx   . Rectal cancer Neg Hx   . Ulcerative colitis Neg Hx   . Crohn's disease Neg Hx   . Asthma Daughter   . Rheum arthritis Brother   . Clotting disorder Brother     Prior to Admission medications   Medication Sig Start Date End Date Taking? Authorizing Provider  acetaminophen (TYLENOL) 650 MG CR tablet Take 650 mg by mouth every 8 (eight) hours as needed (For back pain.).   Yes Historical Provider, MD  acyclovir (ZOVIRAX) 400 MG tablet Take 400 mg by mouth daily.   Yes Historical Provider, MD  Calcium Carbonate-Vitamin D (CALCIUM 600+D) 600-400 MG-UNIT per tablet Take 1 tablet by mouth every evening.    Yes Historical Provider, MD  celecoxib (CELEBREX) 200 MG capsule  Take 200 mg by mouth 2 (two) times daily.   Yes Historical Provider, MD  Cholecalciferol (VITAMIN D-3) 1000 UNITS CAPS Take 1,000 Units by mouth daily.   Yes Historical Provider, MD  docusate sodium (COLACE) 100 MG capsule Take 100 mg by mouth at bedtime.   Yes Historical Provider, MD  estradiol (ESTRACE) 1 MG tablet Take 1 mg by mouth daily.   Yes Historical Provider, MD  FLUoxetine (PROZAC) 20 MG capsule Take 20 mg by mouth daily.   Yes Historical Provider, MD  fluticasone (FLONASE) 50 MCG/ACT nasal spray Place 2 sprays into both nostrils daily.   Yes Historical Provider, MD  furosemide (LASIX) 20 MG tablet Take 1 tablet (20 mg total) by mouth daily. 01/22/15  Yes Fritzi Mandes, MD  gabapentin (NEURONTIN) 300 MG capsule Take 300 mg by mouth 2 (two) times daily.   Yes Historical Provider, MD  levothyroxine (SYNTHROID, LEVOTHROID) 112 MCG tablet Take 112 mcg by mouth daily before breakfast.   Yes Historical Provider, MD  lidocaine-prilocaine  (EMLA) cream Apply 1 application topically as needed (For port-a-cath.).   Yes Historical Provider, MD  loratadine (CLARITIN) 10 MG tablet Take 10 mg by mouth daily.   Yes Historical Provider, MD  Omega-3 350 MG CAPS Take 1 capsule by mouth daily.   Yes Historical Provider, MD  omeprazole (PRILOSEC) 40 MG capsule Take 40 mg by mouth daily.   Yes Historical Provider, MD  ondansetron (ZOFRAN) 8 MG tablet Take 8 mg by mouth every 6 (six) hours as needed for nausea or vomiting.  12/25/14  Yes Historical Provider, MD  polyethylene glycol (MIRALAX / GLYCOLAX) packet Take 17 g by mouth at bedtime.   Yes Historical Provider, MD  potassium chloride SA (K-DUR,KLOR-CON) 20 MEQ tablet Take 1 tablet (20 mEq total) by mouth daily. 01/22/15  Yes Fritzi Mandes, MD  prochlorperazine (COMPAZINE) 10 MG tablet Take 10 mg by mouth every 6 (six) hours as needed for nausea or vomiting.  12/25/14  Yes Historical Provider, MD  psyllium (METAMUCIL) 58.6 % packet Take 1-3 packets by mouth at bedtime.    Yes Historical Provider, MD  simvastatin (ZOCOR) 40 MG tablet Take 40 mg by mouth at bedtime. 01/08/15  Yes Historical Provider, MD  vitamin E (VITAMIN E) 400 UNIT capsule Take 400 Units by mouth daily.    Yes Historical Provider, MD   Physical Exam: Filed Vitals:   01/23/15 1615 01/23/15 1701  BP: 125/72 148/82  Pulse: 106 95  Temp:    Resp: 21 24     General: Well developed, well nourished, NAD, appears stated age  HEENT: NCAT, PERRLA, EOMI, Anicteic Sclera, mucous membranes dry.   Neck: Supple, no JVD, no masses  Cardiovascular: S1 S2 auscultated, IRR, soft SEM  Respiratory: Clear to auscultation bilaterally with equal chest rise  Abdomen: Soft, obese, nontender, nondistended, + bowel sounds  Extremities: warm dry without cyanosis clubbing or edema  Neuro: AAOx3, cranial nerves grossly intact. Strength 5/5 in patient's upper and lower extremities bilaterally  Skin: Without rashes exudates or  nodules  Psych: Normal affect and demeanor with intact judgement and insight  Labs on Admission:  Basic Metabolic Panel:  Recent Labs Lab 01/17/15 0806 01/21/15 0310 01/22/15 0410 01/23/15 1602  NA 140 139 138 138  K 3.9 3.9 3.3* 3.3*  CL  --  110 106 106  CO2 _0 20*  GLUCOSE 135 136* 98 116*  BUN 8.4 13 23* 21*  CREATININE 0.8 0.67 0.90 1.44*  CALCIUM  9.4 9.0 9.0 10.2   Liver Function Tests:  Recent Labs Lab 01/17/15 0806 01/21/15 0310  AST 22 73*  ALT 18 72*  ALKPHOS 217* 169*  BILITOT <0.30 0.7  PROT 6.3* 7.1  ALBUMIN 3.5 4.3   No results for input(s): LIPASE, AMYLASE in the last 168 hours. No results for input(s): AMMONIA in the last 168 hours. CBC:  Recent Labs Lab 01/17/15 0806 01/21/15 0310 01/22/15 0410 01/23/15 1602  WBC 7.5 8.7 8.0 17.2*  NEUTROABS 4.6 5.5  --   --   HGB 10.7* 11.8* 10.3* 10.0*  HCT 32.0* 35.2 30.4* 29.5*  MCV 91.7 95.2 94.0 90.8  PLT 576* 601* 500* 476*   Cardiac Enzymes:  Recent Labs Lab 01/21/15 0310 01/21/15 0804 01/21/15 1436 01/21/15 2121  TROPONINI 0.06* 0.16* 0.38* 0.20*    BNP (last 3 results)  Recent Labs  01/21/15 0310 01/23/15 1602  BNP 514.0* 622.7*    ProBNP (last 3 results) No results for input(s): PROBNP in the last 8760 hours.  CBG: No results for input(s): GLUCAP in the last 168 hours.  Radiological Exams on Admission: Dg Chest 2 View  01/23/2015  CLINICAL DATA:  Shortness of breath for 1 week. History of colon carcinoma and lymphoma EXAM: CHEST  2 VIEW COMPARISON:  January 22, 2015. FINDINGS: Lungs are now clear. Heart is borderline prominent with pulmonary vascularity within normal limits. Port-A-Cath tip is in the superior vena cava. No adenopathy. No blastic or lytic bone lesions. There is degenerative change in the thoracic spine and each shoulder. IMPRESSION: Lungs are now clear. Small pleural effusions have resolved. No new opacity. Heart borderline prominent but stable.  Electronically Signed   By: Lowella Grip III M.D.   On: 01/23/2015 13:26   Dg Chest 2 View  01/22/2015  CLINICAL DATA:  Recent discharge from the hospital having completed chemotherapy for Hodgkin's lymphoma, new onset of shortness of breath, chest tightness, and burning sensation. EXAM: CHEST  2 VIEW COMPARISON:  CT scan of the chest of January 21, 2015 and chest x-ray of the same day FINDINGS: The lungs are adequately inflated. There are small bilateral pleural effusions layering inferiorly. There is atelectasis or developing infiltrate at the left lung base. There is no alveolar infiltrate or pneumothorax. The heart is mildly enlarged. The pulmonary vascularity is normal. The power port appliance tip projects over the midportion of the SVC. There is mild multilevel degenerative disc disease of the thoracic spine with calcification of portions of the anterior longitudinal ligament. IMPRESSION: Small bilateral pleural effusions. Left lower lobe atelectasis or pneumonia. There no pulmonary vascular congestion. Electronically Signed   By: David  Martinique M.D.   On: 01/22/2015 08:49    EKG: Independently reviewed. Atrial fibrillation, rate 112  Assessment/Plan  New onset atrial fibrillation -Patient will be admitted for observation to telemetry -Will obtain TSH, magnesium levels. Will replace potassium -Possibly secondary to dehydration. Patient was recently admitted for CHF and had 7 L of fluid removed. -Will place on metoprolol 2.5 mg IV as needed to maintain heart rate below 110. -Currently patient does appear to be rate control. -CHADSVASC 5 (based on Gender, age, CHF, HTN) -Will start on heparin per pharmacy -Patient will likely need oral AC   Acute kidney injury -Baseline creatinine of 0.6. Currently 1.44 upon admission. -Likely secondary to diuresis -Will give gentle IV hydration and continue to monitor BMP. If no improvement, will obtain renal ultrasound and urine electrolytes  (however they may not be accurate due to  Lasix).  Elevated troponin -Upon admission, 0.20 -Trending downward from previous admission at [facility] secondary to demand ischemia from her CHF exacerbation in her last admission versus atrial fibrillation with RVR -Will continue to cycle  Chronic systolic heart failure -Echocardiogram 01/21/2015 showed an EF of 40-45% -Will hold Lasix for the time being. -Monitor intake and output, daily weights  History of colon cancer/lymphoma -Will make Dr. Alvy Bimler, oncology, aware of patient's admission -She recently received Neulasta today. She had chemotherapy Thursday, Friday, Saturday this past week.  Leukocytosis -Likely reactive -Chest x-ray shows no infection, small pleural effusions resolved -Patient denies any urinary complaints. -Continue monitor CBC  Hypokalemia -Will replace and continue to monitor BMP. -Will also obtain magnesium level  Hypothyroidism -Continue Synthroid. Will obtain TSH level  Depression -Continue Prozac  Hyperlipidemia -Continue statin  GERD -Continue PPI  DVT prophylaxis: Heparin  Code Status: Full  Condition: Guarded   Family Communication: Husband at bedside. Admission, patients condition and plan of care including tests being ordered have been discussed with the patient and husband who indicate understanding and agree with the plan and Code Status.  Disposition Plan: Admitted for observation.  Time spent: 65 minutes  Yacob Wilkerson D.O. [facility] Pager [phone]  If 7PM-7AM, please contact night-coverage [email or web address] Password [facility] 01/23/2015, 5:36 PM

## 2015-01-23 NOTE — ED Provider Notes (Signed)
CSN: [id number]     Arrival date & time 01/23/15  1205 History   First MD Initiated Contact with Patient 01/23/15 1237     Chief Complaint  Patient presents with  . Shortness of Breath    HPI Diamond Boyd is a 74 y.o. F PMH significant for HTN, CHF, hypothyroidism presenting with a one day history of SOB and new onset a-fib. She was sent over from her oncologist's office for a-fib with RVR. She states the SOB occurred last night during sleep. Currently, she is denying fevers, chills, chest pain, SOB, palpitations, N/V.   She was discharged from [facility] yesterday with a diagnosis of CHF. She was started on lasix. She has not taken her lasix today.   Past Medical History  Diagnosis Date  . Colon polyps   . Depression   . Hypothyroidism   . Osteoarthritis     hands  . Peripheral vascular disease ([place])   . Degenerative disk disease     spine in center in past   . 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 Cancer ([place])     skin cancer- basal cell on arm / colon 2011  . Lymphoma ([place])   . Colon cancer ([place])     2011, s/p surgery, in remission  . 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 hodgkins lymphoma   . Heart murmur   . GERD (gastroesophageal reflux disease)   . Anemia     hx blood tx  . Pneumonia   . 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 Hypertension    Past Surgical History  Procedure Laterality Date  . Cholecystectomy    . Splenectomy      lymphoma  . Breast biopsy  1996  . Plantar fascia release    . Ventral hernia repair  1998  . Tendon release      Right thumb   . Colectomy  8/11  . Vaginal hysterectomy    . Bone marrow biopsy  05/27/13  . Cataract extraction, bilateral  2016  . Port-a-cath insertion    . Colonoscopy w/ biopsies    . Dg biopsy lung    . Video bronchoscopy with endobronchial ultrasound N/A 10/17/2014    Procedure: VIDEO  BRONCHOSCOPY WITH ENDOBRONCHIAL ULTRASOUND;  Surgeon: Javier Glazier, MD;  Location: [place];  Service: Thoracic;  Laterality: N/A;  . Video bronchoscopy with endobronchial ultrasound N/A 11/01/2014    Procedure: VIDEO BRONCHOSCOPY WITH ENDOBRONCHIAL ULTRASOUND;  Surgeon: Grace Isaac, MD;  Location: [place];  Service: Thoracic;  Laterality: N/A;  . Mediastinoscopy N/A 11/01/2014    Procedure: MEDIASTINOSCOPY;  Surgeon: Grace Isaac, MD;  Location: [facility];  Service: Thoracic;  Laterality: N/A;  . Lymph node biopsy N/A 11/01/2014    Procedure: MEDIASTINAL LYMPH NODE BIOPSY;  Surgeon: Grace Isaac, MD;  Location: [place];  Service: Thoracic;  Laterality: N/A;   Family History  Problem Relation Age of Onset  . Coronary artery disease Mother   . Alcohol abuse Mother   . Diabetes Mother   . Diabetes Brother   . Fibromyalgia Daughter     chronic pain   . COPD Daughter   . Colon cancer Neg Hx   . Colon polyps Neg Hx   . Stomach cancer Neg Hx   . Rectal cancer Neg Hx   . Ulcerative colitis Neg Hx   .  Crohn's disease Neg Hx   . Asthma Daughter   . Rheum arthritis Brother   . Clotting disorder Brother    Social History  Substance Use Topics  . Smoking status: Never Smoker   . Smokeless tobacco: Never Used     Comment: Second-hand exposure through father.  . Alcohol Use: No   OB History    No data available     Review of Systems  Ten systems are reviewed and are negative for acute change except as noted in the HPI  Allergies  Achromycin; Allopurinol; Astelin; Cephalexin; Codeine; Meloxicam; Minocycline; Nabumetone; Nyquil; Penicillins; Sulfa antibiotics; Zolpidem tartrate; Buspar; and Ciprofloxacin  Home Medications   Prior to Admission medications   Medication Sig Start Date End Date Taking? Authorizing Provider  acetaminophen (TYLENOL) 650 MG CR tablet Take 650 mg by mouth every 8 (eight) hours as needed (For back pain.).   Yes Historical Provider, MD  acyclovir  (ZOVIRAX) 400 MG tablet Take 400 mg by mouth daily.   Yes Historical Provider, MD  Calcium Carbonate-Vitamin D (CALCIUM 600+D) 600-400 MG-UNIT per tablet Take 1 tablet by mouth every evening.    Yes Historical Provider, MD  celecoxib (CELEBREX) 200 MG capsule Take 200 mg by mouth 2 (two) times daily.   Yes Historical Provider, MD  Cholecalciferol (VITAMIN D-3) 1000 UNITS CAPS Take 1,000 Units by mouth daily.   Yes Historical Provider, MD  docusate sodium (COLACE) 100 MG capsule Take 100 mg by mouth at bedtime.   Yes Historical Provider, MD  estradiol (ESTRACE) 1 MG tablet Take 1 mg by mouth daily.   Yes Historical Provider, MD  FLUoxetine (PROZAC) 20 MG capsule Take 20 mg by mouth daily.   Yes Historical Provider, MD  fluticasone (FLONASE) 50 MCG/ACT nasal spray Place 2 sprays into both nostrils daily.   Yes Historical Provider, MD  furosemide (LASIX) 20 MG tablet Take 1 tablet (20 mg total) by mouth daily. 01/22/15  Yes Fritzi Mandes, MD  gabapentin (NEURONTIN) 300 MG capsule Take 300 mg by mouth 2 (two) times daily.   Yes Historical Provider, MD  levothyroxine (SYNTHROID, LEVOTHROID) 112 MCG tablet Take 112 mcg by mouth daily before breakfast.   Yes Historical Provider, MD  lidocaine-prilocaine (EMLA) cream Apply 1 application topically as needed (For port-a-cath.).   Yes Historical Provider, MD  loratadine (CLARITIN) 10 MG tablet Take 10 mg by mouth daily.   Yes Historical Provider, MD  Omega-3 350 MG CAPS Take 1 capsule by mouth daily.   Yes Historical Provider, MD  omeprazole (PRILOSEC) 40 MG capsule Take 40 mg by mouth daily.   Yes Historical Provider, MD  ondansetron (ZOFRAN) 8 MG tablet Take 8 mg by mouth every 6 (six) hours as needed for nausea or vomiting.  12/25/14  Yes Historical Provider, MD  polyethylene glycol (MIRALAX / GLYCOLAX) packet Take 17 g by mouth at bedtime.   Yes Historical Provider, MD  potassium chloride SA (K-DUR,KLOR-CON) 20 MEQ tablet Take 1 tablet (20 mEq total) by mouth  daily. 01/22/15  Yes Fritzi Mandes, MD  prochlorperazine (COMPAZINE) 10 MG tablet Take 10 mg by mouth every 6 (six) hours as needed for nausea or vomiting.  12/25/14  Yes Historical Provider, MD  psyllium (METAMUCIL) 58.6 % packet Take 1-3 packets by mouth at bedtime.    Yes Historical Provider, MD  simvastatin (ZOCOR) 40 MG tablet Take 40 mg by mouth at bedtime. 01/08/15  Yes Historical Provider, MD  vitamin E (VITAMIN E) 400 UNIT capsule Take 400  Units by mouth daily.    Yes Historical Provider, MD   BP 125/72 mmHg  Pulse 106  Temp(Src) 98.3 F (36.8 C) (Oral)  Resp 21  SpO2 97%  LMP 02/10/1970 Physical Exam  Constitutional: She appears well-developed and well-nourished. No distress.  HENT:  Head: Normocephalic and atraumatic.  Mouth/Throat: Oropharynx is clear and moist. No oropharyngeal exudate.  Eyes: Conjunctivae are normal. Pupils are equal, round, and reactive to light. Right eye exhibits no discharge. Left eye exhibits no discharge. No scleral icterus.  Neck: No tracheal deviation present.  Cardiovascular: Normal rate, regular rhythm, normal heart sounds and intact distal pulses.  Exam reveals no gallop and no friction rub.   No murmur heard. Pulmonary/Chest: Effort normal and breath sounds normal. No respiratory distress. She has no wheezes. She has no rales. She exhibits no tenderness.  Abdominal: Soft. Bowel sounds are normal. She exhibits no distension and no mass. There is no tenderness. There is no rebound and no guarding.  Musculoskeletal:  Mild edema bilateral LE  Lymphadenopathy:    She has no cervical adenopathy.  Neurological: She is alert. Coordination normal.  Skin: Skin is warm and dry. No rash noted. She is not diaphoretic. No erythema.  Psychiatric: She has a normal mood and affect. Her behavior is normal.  Nursing note and vitals reviewed.   ED Course  Procedures  Labs Review Labs Reviewed  Randolm Idol, ED    Imaging Review Dg Chest 2  View  01/23/2015  CLINICAL DATA:  Shortness of breath for 1 week. History of colon carcinoma and lymphoma EXAM: CHEST  2 VIEW COMPARISON:  January 22, 2015. FINDINGS: Lungs are now clear. Heart is borderline prominent with pulmonary vascularity within normal limits. Port-A-Cath tip is in the superior vena cava. No adenopathy. No blastic or lytic bone lesions. There is degenerative change in the thoracic spine and each shoulder. IMPRESSION: Lungs are now clear. Small pleural effusions have resolved. No new opacity. Heart borderline prominent but stable. Electronically Signed   By: Lowella Grip III M.D.   On: 01/23/2015 13:26   Dg Chest 2 View  01/22/2015  CLINICAL DATA:  Recent discharge from the hospital having completed chemotherapy for Hodgkin's lymphoma, new onset of shortness of breath, chest tightness, and burning sensation. EXAM: CHEST  2 VIEW COMPARISON:  CT scan of the chest of January 21, 2015 and chest x-ray of the same day FINDINGS: The lungs are adequately inflated. There are small bilateral pleural effusions layering inferiorly. There is atelectasis or developing infiltrate at the left lung base. There is no alveolar infiltrate or pneumothorax. The heart is mildly enlarged. The pulmonary vascularity is normal. The power port appliance tip projects over the midportion of the SVC. There is mild multilevel degenerative disc disease of the thoracic spine with calcification of portions of the anterior longitudinal ligament. IMPRESSION: Small bilateral pleural effusions. Left lower lobe atelectasis or pneumonia. There no pulmonary vascular congestion. Electronically Signed   By: David  Martinique M.D.   On: 01/22/2015 08:49   I have personally reviewed and evaluated these images and lab results as part of my medical decision-making.   EKG Interpretation   Date/Time:  Tuesday January 23 2015 12:11:31 EST Ventricular Rate:  129 PR Interval:    QRS Duration: 126 QT Interval:  370 QTC  Calculation: 542 R Axis:   100 Text Interpretation:  Atrial fibrillation with rapid ventricular response  Rightward axis Non-specific intra-ventricular conduction block Abnormal  ECG Since last tracing R axis is  shifted Confirmed by Eulis Foster  MD, [place]  [phone]) on 01/23/2015 3:06:42 PM      MDM   Final diagnoses:  New onset a-fib ([place])   Patient uncomfortable appearing with intermittent slight tachycardia. Documented max HR of 124. Dr. 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  CHA2DS2-VASc score of 6. Ordered lopressor. EKG with a-fib and R axis shift, which are new since last tracing.  I spoke with the admitting team who agrees for admission to tele. Patient in understanding and agreement with the plan. Patient may be safely admitted.   Girard Lions, PA-C 01/26/15 2307  Daleen Bo, MD 01/27/15 0001

## 2015-01-23 NOTE — Telephone Encounter (Signed)
I have placed an urgent POF to see her this morning and inj after that Since she has to drive from [facility] can you call her to show up for 1015 am this morning?

## 2015-01-23 NOTE — Telephone Encounter (Signed)
Call [place], RN at 01/23/2015 8:25 AM     Status: Signed       Expand All Collapse All   Pt coming at 1015 today to see Dr Alvy Bimler and Injection

## 2015-01-23 NOTE — Telephone Encounter (Signed)
Pt coming at 1015 today to see Dr Alvy Bimler and Injection

## 2015-01-24 ENCOUNTER — Observation Stay (HOSPITAL_COMMUNITY): Payer: Medicare Other

## 2015-01-24 DIAGNOSIS — R931 Abnormal findings on diagnostic imaging of heart and coronary circulation: Secondary | ICD-10-CM | POA: Diagnosis not present

## 2015-01-24 DIAGNOSIS — Z86010 Personal history of colonic polyps: Secondary | ICD-10-CM | POA: Diagnosis not present

## 2015-01-24 DIAGNOSIS — G620 Drug-induced polyneuropathy: Secondary | ICD-10-CM | POA: Diagnosis present

## 2015-01-24 DIAGNOSIS — T502X5A Adverse effect of carbonic-anhydrase inhibitors, benzothiadiazides and other diuretics, initial encounter: Secondary | ICD-10-CM | POA: Diagnosis present

## 2015-01-24 DIAGNOSIS — Y92019 Unspecified place in single-family (private) house as the place of occurrence of the external cause: Secondary | ICD-10-CM | POA: Diagnosis not present

## 2015-01-24 DIAGNOSIS — E785 Hyperlipidemia, unspecified: Secondary | ICD-10-CM | POA: Diagnosis present

## 2015-01-24 DIAGNOSIS — I4891 Unspecified atrial fibrillation: Secondary | ICD-10-CM | POA: Diagnosis not present

## 2015-01-24 DIAGNOSIS — F329 Major depressive disorder, single episode, unspecified: Secondary | ICD-10-CM | POA: Diagnosis not present

## 2015-01-24 DIAGNOSIS — I10 Essential (primary) hypertension: Secondary | ICD-10-CM

## 2015-01-24 DIAGNOSIS — Z885 Allergy status to narcotic agent status: Secondary | ICD-10-CM | POA: Diagnosis not present

## 2015-01-24 DIAGNOSIS — E039 Hypothyroidism, unspecified: Secondary | ICD-10-CM | POA: Diagnosis present

## 2015-01-24 DIAGNOSIS — Z8572 Personal history of non-Hodgkin lymphomas: Secondary | ICD-10-CM | POA: Diagnosis not present

## 2015-01-24 DIAGNOSIS — I5040 Unspecified combined systolic (congestive) and diastolic (congestive) heart failure: Secondary | ICD-10-CM | POA: Diagnosis not present

## 2015-01-24 DIAGNOSIS — K219 Gastro-esophageal reflux disease without esophagitis: Secondary | ICD-10-CM | POA: Diagnosis not present

## 2015-01-24 DIAGNOSIS — Z7901 Long term (current) use of anticoagulants: Secondary | ICD-10-CM | POA: Diagnosis not present

## 2015-01-24 DIAGNOSIS — Z886 Allergy status to analgesic agent status: Secondary | ICD-10-CM | POA: Diagnosis not present

## 2015-01-24 DIAGNOSIS — Z85828 Personal history of other malignant neoplasm of skin: Secondary | ICD-10-CM | POA: Diagnosis not present

## 2015-01-24 DIAGNOSIS — D72819 Decreased white blood cell count, unspecified: Secondary | ICD-10-CM | POA: Diagnosis present

## 2015-01-24 DIAGNOSIS — Z882 Allergy status to sulfonamides status: Secondary | ICD-10-CM | POA: Diagnosis not present

## 2015-01-24 DIAGNOSIS — I248 Other forms of acute ischemic heart disease: Secondary | ICD-10-CM | POA: Diagnosis present

## 2015-01-24 DIAGNOSIS — Z888 Allergy status to other drugs, medicaments and biological substances status: Secondary | ICD-10-CM | POA: Diagnosis not present

## 2015-01-24 DIAGNOSIS — J9600 Acute respiratory failure, unspecified whether with hypoxia or hypercapnia: Secondary | ICD-10-CM | POA: Diagnosis not present

## 2015-01-24 DIAGNOSIS — E860 Dehydration: Secondary | ICD-10-CM | POA: Diagnosis present

## 2015-01-24 DIAGNOSIS — Z880 Allergy status to penicillin: Secondary | ICD-10-CM | POA: Diagnosis not present

## 2015-01-24 DIAGNOSIS — I5041 Acute combined systolic (congestive) and diastolic (congestive) heart failure: Secondary | ICD-10-CM | POA: Diagnosis not present

## 2015-01-24 DIAGNOSIS — Z8249 Family history of ischemic heart disease and other diseases of the circulatory system: Secondary | ICD-10-CM | POA: Diagnosis not present

## 2015-01-24 DIAGNOSIS — R7989 Other specified abnormal findings of blood chemistry: Secondary | ICD-10-CM

## 2015-01-24 DIAGNOSIS — Z8571 Personal history of Hodgkin lymphoma: Secondary | ICD-10-CM | POA: Diagnosis not present

## 2015-01-24 DIAGNOSIS — N179 Acute kidney failure, unspecified: Secondary | ICD-10-CM | POA: Diagnosis not present

## 2015-01-24 DIAGNOSIS — I5022 Chronic systolic (congestive) heart failure: Secondary | ICD-10-CM

## 2015-01-24 DIAGNOSIS — Z7722 Contact with and (suspected) exposure to environmental tobacco smoke (acute) (chronic): Secondary | ICD-10-CM | POA: Diagnosis present

## 2015-01-24 DIAGNOSIS — T451X5A Adverse effect of antineoplastic and immunosuppressive drugs, initial encounter: Secondary | ICD-10-CM | POA: Diagnosis present

## 2015-01-24 DIAGNOSIS — I2510 Atherosclerotic heart disease of native coronary artery without angina pectoris: Secondary | ICD-10-CM | POA: Diagnosis not present

## 2015-01-24 DIAGNOSIS — E876 Hypokalemia: Secondary | ICD-10-CM | POA: Diagnosis present

## 2015-01-24 DIAGNOSIS — I739 Peripheral vascular disease, unspecified: Secondary | ICD-10-CM | POA: Diagnosis present

## 2015-01-24 DIAGNOSIS — C8228 Follicular lymphoma grade III, unspecified, lymph nodes of multiple sites: Secondary | ICD-10-CM | POA: Diagnosis present

## 2015-01-24 DIAGNOSIS — Z85038 Personal history of other malignant neoplasm of large intestine: Secondary | ICD-10-CM

## 2015-01-24 DIAGNOSIS — D630 Anemia in neoplastic disease: Secondary | ICD-10-CM | POA: Diagnosis not present

## 2015-01-24 DIAGNOSIS — D649 Anemia, unspecified: Secondary | ICD-10-CM | POA: Diagnosis present

## 2015-01-24 DIAGNOSIS — I110 Hypertensive heart disease with heart failure: Secondary | ICD-10-CM | POA: Diagnosis present

## 2015-01-24 DIAGNOSIS — Z881 Allergy status to other antibiotic agents status: Secondary | ICD-10-CM | POA: Diagnosis not present

## 2015-01-24 LAB — NM MYOCAR MULTI W/SPECT W/WALL MOTION / EF
CHL CUP MPHR: 145 {beats}/min
CHL CUP NUCLEAR SDS: 2
CHL CUP NUCLEAR SRS: 8
CHL CUP NUCLEAR SSS: 10
CHL CUP RESTING HR STRESS: 94 {beats}/min
CSEPHR: 84 %
Estimated workload: 1 METS
Exercise duration (min): 0 min
Exercise duration (sec): 0 s
LHR: 0.12
LVDIAVOL: 100 mL
LVSYSVOL: 65 mL
Peak HR: 123 {beats}/min
TID: 1.14

## 2015-01-24 LAB — COMPREHENSIVE METABOLIC PANEL
ALT: 38 U/L (ref 14–54)
AST: 32 U/L (ref 15–41)
Albumin: 3.1 g/dL — ABNORMAL LOW (ref 3.5–5.0)
Alkaline Phosphatase: 160 U/L — ABNORMAL HIGH (ref 38–126)
Anion gap: 5 (ref 5–15)
BUN: 19 mg/dL (ref 6–20)
CHLORIDE: 110 mmol/L (ref 101–111)
CO2: 24 mmol/L (ref 22–32)
Calcium: 9.3 mg/dL (ref 8.9–10.3)
Creatinine, Ser: 1.39 mg/dL — ABNORMAL HIGH (ref 0.44–1.00)
GFR, EST AFRICAN AMERICAN: 42 mL/min — AB (ref >60)
GFR, EST NON AFRICAN AMERICAN: 36 mL/min — AB (ref >60)
Glucose, Bld: 110 mg/dL — ABNORMAL HIGH (ref 65–99)
POTASSIUM: 3.3 mmol/L — AB (ref 3.5–5.1)
Sodium: 139 mmol/L (ref 135–145)
Total Bilirubin: 1 mg/dL (ref 0.3–1.2)
Total Protein: 5.3 g/dL — ABNORMAL LOW (ref 6.5–8.1)

## 2015-01-24 LAB — CBC WITH DIFFERENTIAL/PLATELET
BASOS PCT: 0 %
Basophils Absolute: 0 10*3/uL (ref 0.0–0.1)
EOS PCT: 0 %
Eosinophils Absolute: 0 10*3/uL (ref 0.0–0.7)
HCT: 28 % — ABNORMAL LOW (ref 36.0–46.0)
Hemoglobin: 9.4 g/dL — ABNORMAL LOW (ref 12.0–15.0)
LYMPHS ABS: 0.8 10*3/uL (ref 0.7–4.0)
Lymphocytes Relative: 2 %
MCH: 31 pg (ref 26.0–34.0)
MCHC: 33.6 g/dL (ref 30.0–36.0)
MCV: 92.4 fL (ref 78.0–100.0)
MONO ABS: 0 10*3/uL — AB (ref 0.1–1.0)
Monocytes Relative: 0 %
Neutro Abs: 41.1 10*3/uL — ABNORMAL HIGH (ref 1.7–7.7)
Neutrophils Relative %: 98 %
PLATELETS: 354 10*3/uL (ref 150–400)
RBC: 3.03 MIL/uL — ABNORMAL LOW (ref 3.87–5.11)
RDW: 16.2 % — AB (ref 11.5–15.5)
WBC: 41.9 10*3/uL — AB (ref 4.0–10.5)

## 2015-01-24 LAB — HEPARIN LEVEL (UNFRACTIONATED)
HEPARIN UNFRACTIONATED: 0.52 [IU]/mL (ref 0.30–0.70)
Heparin Unfractionated: 0.41 IU/mL (ref 0.30–0.70)

## 2015-01-24 LAB — T4, FREE: Free T4: 0.79 ng/dL (ref 0.61–1.12)

## 2015-01-24 LAB — TROPONIN I
TROPONIN I: 0.05 ng/mL — AB (ref <0.031)
TROPONIN I: 0.05 ng/mL — AB (ref <0.031)

## 2015-01-24 MED ORDER — REGADENOSON 0.4 MG/5ML IV SOLN
0.4000 mg | Freq: Once | INTRAVENOUS | Status: AC
  Administered 2015-01-24: 0.4 mg via INTRAVENOUS
  Filled 2015-01-24: qty 5

## 2015-01-24 MED ORDER — REGADENOSON 0.4 MG/5ML IV SOLN
INTRAVENOUS | Status: AC
  Administered 2015-01-24: 17:00:00
  Filled 2015-01-24: qty 5

## 2015-01-24 MED ORDER — DIPHENHYDRAMINE HCL 25 MG PO CAPS
25.0000 mg | ORAL_CAPSULE | Freq: Every evening | ORAL | Status: DC | PRN
  Administered 2015-01-24 – 2015-01-26 (×4): 25 mg via ORAL
  Filled 2015-01-24 (×4): qty 1

## 2015-01-24 MED ORDER — ASPIRIN 81 MG PO CHEW
81.0000 mg | CHEWABLE_TABLET | ORAL | Status: AC
  Administered 2015-01-25: 81 mg via ORAL
  Filled 2015-01-24: qty 1

## 2015-01-24 MED ORDER — SODIUM CHLORIDE 0.9 % IJ SOLN: 3.0000 mL | INTRAMUSCULAR | Status: DC | PRN

## 2015-01-24 MED ORDER — SODIUM CHLORIDE 0.9 % IJ SOLN
3.0000 mL | Freq: Two times a day (BID) | INTRAMUSCULAR | Status: DC
  Administered 2015-01-25: 3 mL via INTRAVENOUS

## 2015-01-24 MED ORDER — TECHNETIUM TC 99M SESTAMIBI GENERIC - CARDIOLITE
10.0000 | Freq: Once | INTRAVENOUS | Status: AC | PRN
  Administered 2015-01-24: 10 via INTRAVENOUS

## 2015-01-24 MED ORDER — SODIUM CHLORIDE 0.9 % WEIGHT BASED INFUSION
3.0000 mL/kg/h | INTRAVENOUS | Status: DC
  Administered 2015-01-25: 3 mL/kg/h via INTRAVENOUS

## 2015-01-24 MED ORDER — SODIUM CHLORIDE 0.9 % WEIGHT BASED INFUSION
1.0000 mL/kg/h | INTRAVENOUS | Status: DC
  Administered 2015-01-25: 1 mL/kg/h via INTRAVENOUS

## 2015-01-24 MED ORDER — SODIUM CHLORIDE 0.9 % IV SOLN: 250.0000 mL | INTRAVENOUS | Status: DC | PRN

## 2015-01-24 MED ORDER — TECHNETIUM TC 99M SESTAMIBI GENERIC - CARDIOLITE
30.0000 | Freq: Once | INTRAVENOUS | Status: AC | PRN
  Administered 2015-01-24: 30 via INTRAVENOUS

## 2015-01-24 MED ORDER — DIPHENHYDRAMINE HCL 25 MG PO CAPS
ORAL_CAPSULE | ORAL | Status: AC
Start: 2015-01-24 — End: 2015-01-24
  Filled 2015-01-24: qty 1

## 2015-01-24 NOTE — Progress Notes (Signed)
[facility]                                                                              Patient Demographics  Diamond Boyd, is a 74 y.o. female, DOB - 1940/08/17, PNT:[id number]  Admit date - 01/23/2015   Admitting Physician Cristal Ford, DO  Outpatient Primary MD for the patient is [name], MD  LOS -    Chief Complaint  Patient presents with  . Shortness of Breath      HPI on 01/23/2015  Diamond Boyd is a 74 y.o. female with a history of systolic CHF, lymphoma, hypothyroidism presented to the emergency department with complaints of shortness of breath. Patient was at her oncology office visit today when she was found to have atrial fibrillation with RVR. Of note, patient was recently admitted to [facility] hospital last week for chemotherapy. She was then admitted to [facility] for congestive heart failure and was discharged on 01/22/2015. Per husband, who is at bedside, patient had 7 L of fluid removed during her hospital course. She currently denies any sick contacts, recent travel. She denies any palpitations. She does have shortness of breath with movement. Patient has also complained of decreased appetite. While in the emergency department, patient was noted to have atrial fibrillation on her EKG. Heart rate did rise to 124. Patient was given metoprolol. TRH was called for admission.  Assessment & Plan  New onset atrial fibrillation -Possibly secondary to dehydration. Patient was recently admitted for CHF and had 7 L of fluid removed. -Has remained rate controlled, transition to PO metoprolol -CHADSVASC 5 (based on Gender, age, CHF, HTN) -Patient on heparin drip, will likely need to be transitioned to coumadin -Cardiology consulted and appreciated -TSH 7.24, pending Ft4 -Continue to replace potassium  Acute kidney injury -Baseline creatinine of 0.6. 1.44 upon admission. -Cr 1.39 today -Likely secondary to diuresis -Continue to monitor  BMP  Elevated troponin -Upon admission, 0.20- trending downward 0.05  -Trending downward from previous admission at [facility]. -Likely secondary to demand ischemia from her CHF exacerbation in her last admission versus atrial fibrillation with RVR -Plan for lexiscan myoview  Chronic systolic heart failure -Echocardiogram 01/21/2015 showed an EF of 40-45% -Will hold Lasix for the time being. -Monitor intake and output, daily weights  History of colon cancer/lymphoma -Dr. Alvy Bimler made aware of patient's admission- spoke with her regarding WBC -She recently received Neulasta today. She had chemotherapy Thursday, Friday, Saturday this past week.  Leukocytosis -Likely reactive, patient received Neulasta 01/23/2015 -Chest x-ray shows no infection, small pleural effusions resolved -Patient denies any urinary complaints. -Continue monitor CBC  Hypokalemia -Continue to replace and monitor BMP -Magnesium 2  Hypothyroidism -Continue Synthroid.  -TSH 7.240, pending Ft4  Depression -Continue Prozac  Hyperlipidemia -Continue statin  GERD -Continue PPI  Code Status: Full  Family Communication: None at bedside  Disposition Plan: Admitted for observation, pending Lexiscan  Time Spent in minutes   30 minutes  Procedures  [place]   Cardiology Oncology  DVT Prophylaxis  Heparin  Lab Results  Component Value Date   PLT 354 01/24/2015    Medications  Scheduled Meds: . acyclovir  400 mg Oral Daily  .  aspirin EC  81 mg Oral Daily  . calcium-vitamin D  1 tablet Oral q1800  . cholecalciferol  1,000 Units Oral Daily  . diphenhydrAMINE      . docusate sodium  100 mg Oral QHS  . estradiol  1 mg Oral Daily  . FLUoxetine  20 mg Oral Daily  . fluticasone  2 spray Each Nare Daily  . gabapentin  300 mg Oral BID  . levothyroxine  112 mcg Oral QAC breakfast  . loratadine  10 mg Oral Daily  . metoprolol  2.5 mg Intravenous 4 times per day  . omega-3 acid ethyl  esters  1 g Oral Daily  . pantoprazole  40 mg Oral Daily  . polyethylene glycol  17 g Oral QHS  . potassium chloride SA  20 mEq Oral Daily  . psyllium  1 packet Oral QHS  . regadenoson  0.4 mg Intravenous Once  . simvastatin  40 mg Oral QHS  . sodium chloride  500 mL Intravenous Once  . sodium chloride  10-40 mL Intracatheter Q12H  . sodium chloride  3 mL Intravenous Q12H  . vitamin E  400 Units Oral Daily   Continuous Infusions: . sodium chloride 1,000 mL (01/23/15 1830)  . heparin 1,300 Units/hr (01/24/15 1028)   PRN Meds:.acetaminophen **OR** acetaminophen, diphenhydrAMINE, ondansetron **OR** ondansetron (ZOFRAN) IV, prochlorperazine, sodium chloride  Antibiotics   Anti-infectives    Start     Dose/Rate Route Frequency Ordered Stop   01/23/15 1930  acyclovir (ZOVIRAX) tablet 400 mg     400 mg Oral Daily 01/23/15 1820        Subjective:   Diamond Boyd seen and examined today.  Patient states she is feeling better.  She stated she did not have any follow up appointments with cardiology and did not have a cardiologist.  She denies chest pain, palpitations, abdominal pain, shortness of breath, dizziness or headache.  Objective:   Filed Vitals:   01/23/15 1848 01/23/15 2012 01/24/15 0007 01/24/15 0422  BP:  131/65 104/65 140/56  Pulse:  94 74 91  Temp:  97.9 F (36.6 C)  98.1 F (36.7 C)  TempSrc:  Oral  Oral  Resp:      Height: '5\' 6"'$  (1.676 m)     Weight: 105.461 kg (232 lb 8 oz)   105.28 kg (232 lb 1.6 oz)  SpO2:  99%  96%    Wt Readings from Last 3 Encounters:  01/24/15 105.28 kg (232 lb 1.6 oz)  01/23/15 105.87 kg (233 lb 6.4 oz)  01/21/15 106.595 kg (235 lb)     Intake/Output Summary (Last 24 hours) at 01/24/15 1104 Last data filed at 01/24/15 1028  Gross per 24 hour  Intake 1676.21 ml  Output   1000 ml  Net 676.21 ml    Exam  General: Well developed, well nourished, NAD, appears stated age  18: NCAT, mucous membranes moist.    Cardiovascular: S1 S2 auscultated, irregular  Respiratory: Clear to auscultation bilaterally   Abdomen: Soft, nontender, nondistended, + bowel sounds  Extremities: warm dry without cyanosis clubbing or edema  Neuro: AAOx3, nonfocal  Psych: Normal affect and demeanor   Data Review   Micro Results Recent Results (from the past 240 hour(s))  Culture, blood (routine x 2)     Status: None (Preliminary result)   Collection Time: 01/21/15  3:10 AM  Result Value Ref Range Status   Specimen Description BLOOD RIGHT HAND  Final   Special Requests BOTTLES DRAWN AEROBIC AND  ANAEROBIC 4CC  Final   Culture NO GROWTH 2 DAYS  Final   Report Status PENDING  Incomplete  Culture, blood (routine x 2)     Status: None (Preliminary result)   Collection Time: 01/21/15  3:11 AM  Result Value Ref Range Status   Specimen Description BLOOD LEFT ASSIST CONTROL  Final   Special Requests BOTTLES DRAWN AEROBIC AND ANAEROBIC 4CC  Final   Culture NO GROWTH 2 DAYS  Final   Report Status PENDING  Incomplete  MRSA PCR Screening     Status: None   Collection Time: 01/21/15  8:46 AM  Result Value Ref Range Status   MRSA by PCR NEGATIVE NEGATIVE Final    Comment:        The GeneXpert MRSA Assay (FDA approved for NASAL specimens only), is one component of a comprehensive MRSA colonization surveillance program. It is not intended to diagnose MRSA infection nor to guide or monitor treatment for MRSA infections.     Radiology Reports Dg Chest 2 View  01/23/2015  CLINICAL DATA:  Shortness of breath for 1 week. History of colon carcinoma and lymphoma EXAM: CHEST  2 VIEW COMPARISON:  January 22, 2015. FINDINGS: Lungs are now clear. Heart is borderline prominent with pulmonary vascularity within normal limits. Port-A-Cath tip is in the superior vena cava. No adenopathy. No blastic or lytic bone lesions. There is degenerative change in the thoracic spine and each shoulder. IMPRESSION: Lungs are now clear.  Small pleural effusions have resolved. No new opacity. Heart borderline prominent but stable. Electronically Signed   By: Lowella Grip III M.D.   On: 01/23/2015 13:26   Dg Chest 2 View  01/22/2015  CLINICAL DATA:  Recent discharge from the hospital having completed chemotherapy for Hodgkin's lymphoma, new onset of shortness of breath, chest tightness, and burning sensation. EXAM: CHEST  2 VIEW COMPARISON:  CT scan of the chest of January 21, 2015 and chest x-ray of the same day FINDINGS: The lungs are adequately inflated. There are small bilateral pleural effusions layering inferiorly. There is atelectasis or developing infiltrate at the left lung base. There is no alveolar infiltrate or pneumothorax. The heart is mildly enlarged. The pulmonary vascularity is normal. The power port appliance tip projects over the midportion of the SVC. There is mild multilevel degenerative disc disease of the thoracic spine with calcification of portions of the anterior longitudinal ligament. IMPRESSION: Small bilateral pleural effusions. Left lower lobe atelectasis or pneumonia. There no pulmonary vascular congestion. Electronically Signed   By: David  Martinique M.D.   On: 01/22/2015 08:49   Ct Angio Chest Pe W/cm &/or Wo Cm  01/21/2015  CLINICAL DATA:  Acute onset of respiratory distress. Tachypnea. Initial encounter. EXAM: CT ANGIOGRAPHY CHEST WITH CONTRAST TECHNIQUE: Multidetector CT imaging of the chest was performed using the standard protocol during bolus administration of intravenous contrast. Multiplanar CT image reconstructions and MIPs were obtained to evaluate the vascular anatomy. CONTRAST:  137m OMNIPAQUE IOHEXOL 350 MG/ML SOLN COMPARISON:  Chest radiograph performed earlier today at 3:03 a.m., and PET/CT performed 12/13/2014 FINDINGS: There is no evidence of pulmonary embolus. Small bilateral pleural effusions are noted. Interstitial prominence is noted, raising concern for mild interstitial edema. Mild  bibasilar atelectasis is seen. There is no evidence of pneumothorax. No masses are identified; no abnormal focal contrast enhancement is seen. Diffuse coronary artery calcifications are seen. Vague nonspecific soft tissue inflammation is noted about the distal trachea and azygoesophageal region. No pericardial effusion is identified. No mediastinal lymphadenopathy  is seen. The great vessels are grossly unremarkable in appearance. No axillary lymphadenopathy is seen. The visualized portions of the thyroid gland are unremarkable in appearance. A right-sided chest port is noted ending about the distal SVC. The visualized portions of the liver and spleen are unremarkable. No acute osseous abnormalities are seen. There is mild chronic osseous fusion at T6-T7. Review of the MIP images confirms the above findings. IMPRESSION: 1. No evidence of pulmonary embolus. 2. Small bilateral pleural effusions noted. Interstitial prominence, raising concern for mild interstitial edema. Mild bibasilar atelectasis seen. 3. Diffuse coronary artery calcification noted. 4. Vague nonspecific soft tissue inflammation about the distal trachea and azygoesophageal region. Electronically Signed   By: Garald Balding M.D.   On: 01/21/2015 06:58   Dg Chest Port 1 View  01/21/2015  CLINICAL DATA:  Acute onset of respiratory distress. Initial encounter. EXAM: PORTABLE CHEST 1 VIEW COMPARISON:  Chest radiograph performed 12/30/2014 FINDINGS: The lungs are well-aerated. Vascular congestion is noted. Bibasilar airspace opacities raise concern for mild pulmonary edema. Small bilateral pleural effusions are suspected. No pneumothorax is seen. There is no evidence of pneumothorax. The cardiomediastinal silhouette is mildly enlarged. A right-sided chest port is noted ending about the mid SVC. No acute osseous abnormalities are seen. IMPRESSION: Vascular congestion and mild cardiomegaly. Bibasilar airspace opacities raise concern for mild pulmonary  edema. Small bilateral pleural effusions suspected. Electronically Signed   By: Garald Balding M.D.   On: 01/21/2015 03:22   Dg Chest Port 1 View  12/30/2014  CLINICAL DATA:  Shortness of breath. EXAM: PORTABLE CHEST 1 VIEW COMPARISON:  Chest radiograph 11/01/2014.  PET-CT 12/13/2014 FINDINGS: Tip of the right chest port in the SVC. Mild cardiomegaly is unchanged. Development of mild pulmonary edema. Question left pleural effusion. Ill-defined linear opacities at the lung bases, likely atelectasis. No pneumothorax. No confluent airspace disease. IMPRESSION: 1. Development of pulmonary edema.  Suspect left pleural effusion. 2. Stable mild cardiomegaly. Electronically Signed   By: Jeb Levering M.D.   On: 12/30/2014 05:57    CBC  Recent Labs Lab 01/21/15 0310 01/22/15 0410 01/23/15 1602 01/24/15 0855  WBC 8.7 8.0 17.2* 41.9*  HGB 11.8* 10.3* 10.0* 9.4*  HCT 35.2 30.4* 29.5* 28.0*  PLT 601* 500* 476* 354  MCV 95.2 94.0 90.8 92.4  MCH 31.8 31.7 30.8 31.0  MCHC 33.4 33.7 33.9 33.6  RDW 14.9* 14.9* 16.0* 16.2*  LYMPHSABS 2.6  --   --  0.8  MONOABS 0.5  --   --  0.0*  EOSABS 0.0  --   --  0.0  BASOSABS 0.0  --   --  0.0    Chemistries   Recent Labs Lab 01/21/15 0310 01/22/15 0410 01/23/15 1602 01/23/15 2100 01/24/15 0855  NA 139 138 138  --  139  K 3.9 3.3* 3.3*  --  3.3*  CL 110 106 106  --  110  CO2 22 27 20*  --  24  GLUCOSE 136* 98 116*  --  110*  BUN 13 23* 21*  --  19  CREATININE 0.67 0.90 1.44*  --  1.39*  CALCIUM 9.0 9.0 10.2  --  9.3  MG  --   --   --  2.0  --   AST 73*  --   --   --  32  ALT 72*  --   --   --  38  ALKPHOS 169*  --   --   --  160*  BILITOT 0.7  --   --   --  1.0   ------------------------------------------------------------------------------------------------------------------ estimated creatinine clearance is 42.9 mL/min (by C-G formula based on Cr of  1.39). ------------------------------------------------------------------------------------------------------------------ No results for input(s): HGBA1C in the last 72 hours. ------------------------------------------------------------------------------------------------------------------ No results for input(s): CHOL, HDL, LDLCALC, TRIG, CHOLHDL, LDLDIRECT in the last 72 hours. ------------------------------------------------------------------------------------------------------------------  Recent Labs  01/23/15 2100  TSH 7.240*   ------------------------------------------------------------------------------------------------------------------ No results for input(s): VITAMINB12, FOLATE, FERRITIN, TIBC, IRON, RETICCTPCT in the last 72 hours.  Coagulation profile No results for input(s): INR, PROTIME in the last 168 hours.  No results for input(s): DDIMER in the last 72 hours.  Cardiac Enzymes  Recent Labs Lab 01/23/15 2100 01/24/15 0301 01/24/15 0855  TROPONINI 0.06* 0.05* 0.05*   ------------------------------------------------------------------------------------------------------------------ Invalid input(s): POCBNP    Harmonee Tozer D.O. on 01/24/2015 at 11:04 AM  Between 7am to 7pm - Pager - [phone]  After 7pm go to [email or web address] - password [email or web address]  And look for the night coverage person covering for me after hours  [facility] Office  [phone]

## 2015-01-24 NOTE — Plan of Care (Signed)
Problem: Education: Goal: Understanding of medication regimen will improve Outcome: Completed/Met Date Met:  01/24/15 Discussed with Patient and patient understands IV lopressor is being given to control HR and IV Hep is infusing for anticoagulation.

## 2015-01-24 NOTE — Plan of Care (Signed)
Problem: Cardiac: Goal: Ability to achieve and maintain adequate cardiopulmonary perfusion will improve Outcome: Completed/Met Date Met:  01/24/15 Patient remains in a fib rate controlled with IV hep and IV lopressor given, reports feeling some better

## 2015-01-24 NOTE — Consult Note (Signed)
CARDIOLOGY CONSULT NOTE   Patient ID: Diamond Boyd MRN: [id number] DOB/AGE: [birth year] 74 y.o.  Admit date: 01/23/2015  Primary Physician   [name], MD Primary Cardiologist   [facility]) Reason for Consultation  Afib and elevated troponin  HPI: Diamond Boyd is a 74 y.o. 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 Echo showed EF 40-45%. Seen by cardiologist from [facility] clinic and felt no inpatient workup needed and plan to follow up as outpatient. At that time she had elevated troponin (peak of 0.38)- likely demand.   Patient was at her oncology office visit today when she was found to have atrial fibrillation with RVR and sent to ED for further evaluation. Patient states that she was told that she had a A. fib during last admission at [facility] however no documentation. EKG at that showed sinus or ectopic atrial tachycardia. Recently he has being having intermittent palpitation. Her shortness of breath has improved since discharge. The patient denies nausea, vomiting, fever, chest pain,, orthopnea, PND, dizziness, syncope, cough, congestion, abdominal pain, hematochezia, melena, lower extremity edema.   EKG on this admission showed A. fib at the rate of 117bpm and nonspecific conduction abnormality. BNP 622.7. Troponin I 0.06->0.06. WBC of 17.2. TSH 7.2. Chest x-ray unremarkable. Creatinine 1.44 with BUN of 21.  Patient was placed on heparin and given IV hydration. Given IV lopressore 107m x 1 and now on 2.594mq 6 hours.    Past Medical History  Diagnosis Date  . Colon polyps   . Depression   .  Hypothyroidism   . Osteoarthritis     hands  . Peripheral vascular disease ([facility] [place]  . Degenerative disk disease     spine in center in past   . 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 Cancer ([facility]    skin cancer- basal cell on arm / colon 2011  . Lymphoma ([facility]  . Colon cancer ([facility]    2011, s/p surgery, in remission  . Neuropathy due to chemotherapeutic drug ([facility]5/10/2014  . hodgkins lymphoma   . Heart murmur   . GERD (gastroesophageal reflux disease)   . Anemia     hx blood tx  . Pneumonia   . 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 Hypertension      Past Surgical History  Procedure Laterality Date  . Cholecystectomy    . Splenectomy      lymphoma  . Breast biopsy  1996  . Plantar fascia release    . Ventral hernia repair  1998  . Tendon release      Right thumb   . Colectomy  8/11  . Vaginal hysterectomy    . Bone marrow biopsy  05/27/13  . Cataract extraction, bilateral  2016  . Port-a-cath insertion    . Colonoscopy w/ biopsies    . Dg biopsy lung    . Video bronchoscopy with endobronchial ultrasound N/A 10/17/2014    Procedure: VIDEO BRONCHOSCOPY WITH ENDOBRONCHIAL ULTRASOUND;  Surgeon: JeJavier GlazierMD;  [facility]: [facility] Service: Thoracic;  Laterality: N/A;  . Video  bronchoscopy with endobronchial ultrasound N/A 11/01/2014    Procedure: VIDEO BRONCHOSCOPY WITH ENDOBRONCHIAL ULTRASOUND;  Surgeon: Grace Isaac, MD;  Location: [place];  Service: Thoracic;  Laterality: N/A;  . Mediastinoscopy N/A 11/01/2014    Procedure: MEDIASTINOSCOPY;  Surgeon: Grace Isaac, MD;  Location: [place];  Service: Thoracic;  Laterality: N/A;  . Lymph node biopsy N/A 11/01/2014    Procedure: MEDIASTINAL LYMPH NODE BIOPSY;  Surgeon: Grace Isaac, MD;  Location: [place];  Service: Thoracic;  Laterality: N/A;    Allergies  Allergen Reactions  . Achromycin [Tetracycline Hcl] Other  (See Comments)    Pt does not remember reaction  . Allopurinol Other (See Comments)    REACTION: Unsure of reaction happene years ago  . Astelin [Azelastine Hcl] Other (See Comments)    Reaction unknown  . Cephalexin Other (See Comments)    REACTION: unsure of reaction happened yrs ago.  . Codeine Other (See Comments)    REACTION: abd. pain  . Meloxicam Other (See Comments)    REACTION: GI symptoms  . Minocycline Other (See Comments)    Abdominal pain  . Nabumetone Other (See Comments)    REACTION: reaction not known  . Nyquil [Pseudoeph-Doxylamine-Dm-Apap] Hives  . Penicillins Other (See Comments)    Reaction unknown Has patient had a PCN reaction causing immediate rash, facial/tongue/throat swelling, SOB or lightheadedness with hypotension: unsure reaction unknown Has patient had a PCN reaction causing severe rash involving mucus membranes or skin necrosis: unsure reaction unknown Has patient had a PCN reaction that required hospitalization no Has patient had a PCN reaction occurring within the last 10 years: no If all of the above answers are "NO", then may proceed with Cephalosporin use.  . Sulfa Antibiotics Other (See Comments)    Gi side eff   . Zolpidem Tartrate Other (See Comments)    REACTION: feels too drugged  . Buspar [Buspirone Hcl] Other (See Comments)    Dizziness, and not as effective for anxiety  . Ciprofloxacin Rash    I have reviewed the patient's current medications . acyclovir  400 mg Oral Daily  . aspirin EC  81 mg Oral Daily  . calcium-vitamin D  1 tablet Oral q1800  . cholecalciferol  1,000 Units Oral Daily  . diphenhydrAMINE      . docusate sodium  100 mg Oral QHS  . estradiol  1 mg Oral Daily  . FLUoxetine  20 mg Oral Daily  . fluticasone  2 spray Each Nare Daily  . gabapentin  300 mg Oral BID  . levothyroxine  112 mcg Oral QAC breakfast  . loratadine  10 mg Oral Daily  . metoprolol  2.5 mg Intravenous 4 times per day  . omega-3 acid ethyl  esters  1 g Oral Daily  . pantoprazole  40 mg Oral Daily  . polyethylene glycol  17 g Oral QHS  . potassium chloride SA  20 mEq Oral Daily  . psyllium  1 packet Oral QHS  . simvastatin  40 mg Oral QHS  . sodium chloride  500 mL Intravenous Once  . sodium chloride  10-40 mL Intracatheter Q12H  . sodium chloride  3 mL Intravenous Q12H  . vitamin E  400 Units Oral Daily   . sodium chloride 1,000 mL (01/23/15 1830)  . heparin 1,300 Units/hr (01/23/15 1842)   acetaminophen **OR** acetaminophen, diphenhydrAMINE, ondansetron **OR** ondansetron (ZOFRAN) IV, prochlorperazine, sodium chloride  Prior to Admission medications   Medication Sig Start Date End Date Taking?  Authorizing Provider  acetaminophen (TYLENOL) 650 MG CR tablet Take 650 mg by mouth every 8 (eight) hours as needed (For back pain.).   Yes Historical Provider, MD  acyclovir (ZOVIRAX) 400 MG tablet Take 400 mg by mouth daily.   Yes Historical Provider, MD  Calcium Carbonate-Vitamin D (CALCIUM 600+D) 600-400 MG-UNIT per tablet Take 1 tablet by mouth every evening.    Yes Historical Provider, MD  celecoxib (CELEBREX) 200 MG capsule Take 200 mg by mouth 2 (two) times daily.   Yes Historical Provider, MD  Cholecalciferol (VITAMIN D-3) 1000 UNITS CAPS Take 1,000 Units by mouth daily.   Yes Historical Provider, MD  docusate sodium (COLACE) 100 MG capsule Take 100 mg by mouth at bedtime.   Yes Historical Provider, MD  estradiol (ESTRACE) 1 MG tablet Take 1 mg by mouth daily.   Yes Historical Provider, MD  FLUoxetine (PROZAC) 20 MG capsule Take 20 mg by mouth daily.   Yes Historical Provider, MD  fluticasone (FLONASE) 50 MCG/ACT nasal spray Place 2 sprays into both nostrils daily.   Yes Historical Provider, MD  furosemide (LASIX) 20 MG tablet Take 1 tablet (20 mg total) by mouth daily. 01/22/15  Yes Fritzi Mandes, MD  gabapentin (NEURONTIN) 300 MG capsule Take 300 mg by mouth 2 (two) times daily.   Yes Historical Provider, MD  levothyroxine  (SYNTHROID, LEVOTHROID) 112 MCG tablet Take 112 mcg by mouth daily before breakfast.   Yes Historical Provider, MD  lidocaine-prilocaine (EMLA) cream Apply 1 application topically as needed (For port-a-cath.).   Yes Historical Provider, MD  loratadine (CLARITIN) 10 MG tablet Take 10 mg by mouth daily.   Yes Historical Provider, MD  Omega-3 350 MG CAPS Take 1 capsule by mouth daily.   Yes Historical Provider, MD  omeprazole (PRILOSEC) 40 MG capsule Take 40 mg by mouth daily.   Yes Historical Provider, MD  ondansetron (ZOFRAN) 8 MG tablet Take 8 mg by mouth every 6 (six) hours as needed for nausea or vomiting.  12/25/14  Yes Historical Provider, MD  polyethylene glycol (MIRALAX / GLYCOLAX) packet Take 17 g by mouth at bedtime.   Yes Historical Provider, MD  potassium chloride SA (K-DUR,KLOR-CON) 20 MEQ tablet Take 1 tablet (20 mEq total) by mouth daily. 01/22/15  Yes Fritzi Mandes, MD  prochlorperazine (COMPAZINE) 10 MG tablet Take 10 mg by mouth every 6 (six) hours as needed for nausea or vomiting.  12/25/14  Yes Historical Provider, MD  psyllium (METAMUCIL) 58.6 % packet Take 1-3 packets by mouth at bedtime.    Yes Historical Provider, MD  simvastatin (ZOCOR) 40 MG tablet Take 40 mg by mouth at bedtime. 01/08/15  Yes Historical Provider, MD  vitamin E (VITAMIN E) 400 UNIT capsule Take 400 Units by mouth daily.    Yes Historical Provider, MD     Social History   Social History  . Marital Status: Married    Spouse Name: N/A  . Number of Children: 2  . Years of Education: N/A   Occupational History  . Retired     Social History Main Topics  . Smoking status: Never Smoker   . Smokeless tobacco: Never Used     Comment: Second-hand exposure through father.  . Alcohol Use: No  . Drug Use: No  . Sexual Activity: No   Other Topics Concern  . Not on file   Social History Narrative   Originally from [place]. Always lived in [place]. Previously has traveled to [facility], [place] [employer]  to [place]. No  international travel. No pets currently. Remote parakeet exposure with her children. 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    Lives at home with husband. Weakness due to chemo, but otherwise independant       Family Status  Relation Status Death Age  . Daughter Deceased    Family History  Problem Relation Age of Onset  . Coronary artery disease Mother   . Alcohol abuse Mother   . Diabetes Mother   . Diabetes Brother   . Fibromyalgia Daughter     chronic pain   . COPD Daughter   . Colon cancer Neg Hx   . Colon polyps Neg Hx   . Stomach cancer Neg Hx   . Rectal cancer Neg Hx   . Ulcerative colitis Neg Hx   . Crohn's disease Neg Hx   . Asthma Daughter   . Rheum arthritis Brother   . Clotting disorder Brother       ROS:  Full 14 point review of systems complete and found to be negative unless listed above.  Physical Exam: Blood pressure 140/56, pulse 91, temperature 98.1 F (36.7 C), temperature source Oral, resp. rate 22, height 5' 6"  (1.676 m), weight 232 lb 1.6 oz (105.28 kg), last menstrual period 02/10/1970, SpO2 96 %.  General: Well developed, well nourished, female in no acute distress Head: Eyes PERRLA, No xanthomas. Normocephalic and atraumatic, oropharynx without edema or exudate.  Lungs: Resp regular and unlabored, CTA. Heart: RRR no s3, s4, or murmurs..   Neck: No carotid bruits. No lymphadenopathy.  JVD. Abdomen: Bowel sounds present, abdomen soft and non-tender without masses or hernias noted. Msk:  No spine or cva tenderness. No weakness, no joint deformities or effusions. Extremities: No clubbing, cyanosis or edema. DP/PT/Radials 2+ and equal bilaterally. Neuro: Alert and oriented X 3. No focal deficits noted. Psych:  Good affect, responds appropriately Skin: No rashes or lesions noted.  Labs:   Lab Results  Component Value Date   WBC 17.2* 01/23/2015   HGB 10.0* 01/23/2015   HCT 29.5* 01/23/2015   MCV 90.8 01/23/2015   PLT 476*  01/23/2015   No results for input(s): INR in the last 72 hours.  Recent Labs Lab 01/21/15 0310  01/23/15 1602  NA 139  < > 138  K 3.9  < > 3.3*  CL 110  < > 106  CO2 22  < > 20*  BUN 13  < > 21*  CREATININE 0.67  < > 1.44*  CALCIUM 9.0  < > 10.2  PROT 7.1  --   --   BILITOT 0.7  --   --   ALKPHOS 169*  --   --   ALT 72*  --   --   AST 73*  --   --   GLUCOSE 136*  < > 116*  ALBUMIN 4.3  --   --   < > = values in this interval not displayed. MAGNESIUM  Date Value Ref Range Status  01/23/2015 2.0 1.7 - 2.4 mg/dL Final    Recent Labs  01/21/15 1436 01/21/15 2121 01/23/15 2100 01/24/15 0301  TROPONINI 0.38* 0.20* 0.06* 0.05*    Recent Labs  01/23/15 1234  TROPIPOC 0.08    Echo: 01/21/15 LV EF: 40% -  45%  ------------------------------------------------------------------- Indications:   CHF - 428.0.  ------------------------------------------------------------------- History:  PMH: Cancer pt. Currently taking chemotherapy  ------------------------------------------------------------------- Study Conclusions  - Left ventricle: Systolic function was mildly to moderately reduced. The  estimated ejection fraction was in the range of 40% to 45%. - Mitral valve: There was mild regurgitation.  ECG:   Vent. rate 117 BPM PR interval * ms QRS duration 128 ms QT/QTc 384/535 ms P-R-T axes * -14 110  Radiology:  Dg Chest 2 View  01/23/2015  CLINICAL DATA:  Shortness of breath for 1 week. History of colon carcinoma and lymphoma EXAM: CHEST  2 VIEW COMPARISON:  January 22, 2015. FINDINGS: Lungs are now clear. Heart is borderline prominent with pulmonary vascularity within normal limits. Port-A-Cath tip is in the superior vena cava. No adenopathy. No blastic or lytic bone lesions. There is degenerative change in the thoracic spine and each shoulder. IMPRESSION: Lungs are now clear. Small pleural effusions have resolved. No new opacity. Heart borderline  prominent but stable. Electronically Signed   By: Lowella Grip III M.D.   On: 01/23/2015 13:26   Dg Chest 2 View  01/22/2015  CLINICAL DATA:  Recent discharge from the hospital having completed chemotherapy for Hodgkin's lymphoma, new onset of shortness of breath, chest tightness, and burning sensation. EXAM: CHEST  2 VIEW COMPARISON:  CT scan of the chest of January 21, 2015 and chest x-ray of the same day FINDINGS: The lungs are adequately inflated. There are small bilateral pleural effusions layering inferiorly. There is atelectasis or developing infiltrate at the left lung base. There is no alveolar infiltrate or pneumothorax. The heart is mildly enlarged. The pulmonary vascularity is normal. The power port appliance tip projects over the midportion of the SVC. There is mild multilevel degenerative disc disease of the thoracic spine with calcification of portions of the anterior longitudinal ligament. IMPRESSION: Small bilateral pleural effusions. Left lower lobe atelectasis or pneumonia. There no pulmonary vascular congestion. Electronically Signed   By: David  Martinique M.D.   On: 01/22/2015 08:49    ASSESSMENT AND PLAN:      1. New onset a-fib ([place]) with RVR - CHADSVASCs score of at least 5 (age, sex, HTN and CHF).  - Rate improved on IV lopressor. Switch to po per MD since rate in well controlled, now 80-90s.  - On IV heparin, transition to NOAC. Recent echo showed EF of 45-50% with mild MR.  - Seen by cardiologist from [facility] clinic and felt no inpatient workup needed and plan to follow up as outpatient. Has upcoming appointment Feb 09, 2015. Will get Myoview today for risk stratification. Consider DCCV after 1 months of anticoagulation if not converted by her self.  - TSH high, pending Free T4.  2. LV dysfunction - Recently admitted to [facility] for respiratory failure due to volume overload. Echo as above. Euvolemic today. - BNP 622.7. Given Iv fluid due to dehydration.   3. AKI -  Pending lab this morning  4. Hypokalemia - given supplement. Pending lab.   5. Elevated troponin - trending down from last admission. Will get Myoview.   6.   Hypertension -Stable  7. HL No results found for requested labs within last 365 days.  - Will get lipid panel  Otherwise per primary   History of B-cell lymphoma   Depression   GERD   Hypothyroid   History of colon cancer    Signed: Bhagat,Bhavinkumar, PA 01/24/2015, 8:37 AM Pager [phone]  Co-Sign MD  Patient seen and examined. Agree with assessment and plan.  Ms. Holzworth is a 74 year old female who has a history of colon CA, Hodgkin's lymphoma and recent diagnosis of follicular lymphoma, grade 3 of lymph nodes of multiple  sites.  Patient had undergone 2 cycles of treatment and was recently admitted to [facility] respiratory failure from fluid overload and was felt to have congestive heart failure and cardiomyopathy.  She was seen by her oncologist, Dr. Alvy Bimler yesterday and was found to be in atrial fibrillation with rapid ventricular response.  She has experienced shortness of breath without chest pressure.  Her ECG revealed atrial fibrillation with ventricular rate at 1 17 bpm.  When was mildly positive at 0.06 with a flat response just and probable demand ischemia.  Her BNP was elevated at 622, consistent with acute on chronic heart failure, possibly combined systolic and diastolic.  A 2-D echo Doppler study done recently showed an ejection fraction of 40-45% with mild MR was changed from her echo in 2015 which showed an ejection fraction at 55-60% with grade 1 diastolic dysfunction.  Her ventricular rate is now in the upper 90s to 100 with IV beta blocker therapy and she is on IV heparin for anticoagulation.  Prior to instituting oral anticoagulation therapy, we will plan to perform a [place] nuclear study today for risk stratification of potential ischemia etiology.  With a ventricular rate improved, will  transition from IV metoprolol to metoprolol tartrate, initially at 25 mg every 12 hours and titrate depending upon clinical response.  Recommend follow-up BNP to reassess volume status since she has been hydrated.  Will continue to [facility] patient with you.  Troy Sine, MD, [facility] 01/24/2015 10:04 AM

## 2015-01-24 NOTE — Plan of Care (Signed)
Problem: Pain Managment: Goal: General experience of comfort will improve Outcome: Progressing Patient expressing discomfort related to a headache, tylenol given and will continue to assess effectiveness.

## 2015-01-24 NOTE — Progress Notes (Addendum)
ANTICOAGULATION CONSULT NOTE Pharmacy Consult for Heparin Indication: atrial fibrillation  Patient Measurements: Heparin Dosing Weight: 88 kg  Vital Signs: Temp: 98.1 F (36.7 C) (12/14 0422) Temp Source: Oral (12/14 0422) BP: 140/56 mmHg (12/14 0422) Pulse Rate: 91 (12/14 0422)  Labs:  Recent Labs  01/21/15 2121 01/22/15 0410 01/23/15 1602 01/23/15 2100 01/24/15 0301  HGB  --  10.3* 10.0*  --   --   HCT  --  30.4* 29.5*  --   --   PLT  --  500* 476*  --   --   HEPARINUNFRC  --   --   --   --  0.41  CREATININE  --  0.90 1.44*  --   --   TROPONINI 0.20*  --   --  0.06* 0.05*    Assessment: 70 yoF admitted from oncologist office to [facility] with afib with RVR. PMHx sx for colon cancer/lymphoma currently undergoing chemotherapy treatment and recent admission to OSH for CHF. No anticoagulation PTA. CBC stable. Initial heparin level is therapeutic, CBC stable.   Goal of Therapy:  Heparin level 0.3-0.7 units/ml Monitor platelets by anticoagulation protocol: Yes    Plan:  -Continue heparin gtt at 1300 units/hr -Daily HL, CBC -F/u transition to PO anticoag, warfarin likely best option given patient is active cancer patient undergoing chemotherapy    Thank you for allowing [place] to participate in this patient's care.    Hughes Better, PharmD, BCPS Clinical Pharmacist Pager: [phone] 01/24/2015 8:02 AM   Addendum -Afternoon HL therapeutic -Continue current rate  Harvel Quale  01/24/2015 2:23 PM

## 2015-01-24 NOTE — Progress Notes (Signed)
Nutrition Brief Note  Patient identified on the Malnutrition Screening Tool (MST) Report. Patient with some weight fluctuations (likely related to fluid status), but no significant weight loss.   Wt Readings from Last 15 Encounters:  01/24/15 232 lb 1.6 oz (105.28 kg)  01/23/15 233 lb 6.4 oz (105.87 kg)  01/21/15 235 lb (106.595 kg)  01/20/15 235 lb (106.595 kg)  01/17/15 235 lb 11.2 oz (106.913 kg)  01/12/15 236 lb 11.2 oz (107.366 kg)  01/08/15 237 lb 4 oz (107.616 kg)  12/28/14 240 lb (108.863 kg)  12/27/14 241 lb 14.4 oz (109.725 kg)  12/25/14 240 lb 12.8 oz (109.226 kg)  12/11/14 236 lb 1.6 oz (107.094 kg)  11/14/14 240 lb 11.2 oz (109.181 kg)  11/06/14 231 lb (104.781 kg)  11/01/14 232 lb (105.235 kg)  10/26/14 232 lb (105.235 kg)    Body mass index is 37.48 kg/(m^2). Patient meets criteria for class 2 obesity based on current BMI.   Current diet order is heart healthy. Labs and medications reviewed.   No nutrition interventions warranted at this time. If nutrition issues arise, please consult RD.   Molli Barrows, RD, LDN, [place] Pager [phone] After Hours Pager [phone]

## 2015-01-24 NOTE — Progress Notes (Signed)
[facility]  Patient Status: Active (receiving services up to time of hospitalization)  [facility] is providing the following services: RN and PT  Referred for services from [facility] but admitted to Hospital prior to start of services.  If patient discharges after hours, please call [phone].   Janae Sauce 01/24/2015, 10:30 AM

## 2015-01-24 NOTE — Progress Notes (Signed)
1st troponin .06. No chest pain. Pt is on Heparin drip for AFib with RVR.  KJKG, NP [facility]

## 2015-01-24 NOTE — Plan of Care (Signed)
Problem: Education: Goal: Knowledge of [facility] Education information/materials will improve Outcome: Progressing Verbalized understanding.

## 2015-01-24 NOTE — Progress Notes (Signed)
  1-day NST completed. Awaiting official read by [facility].  Signed, Erma Heritage, PA-C 01/24/2015, 1:06 PM Pager: [phone]

## 2015-01-24 NOTE — Progress Notes (Signed)
     Abnormal nuclear stress test. We are going to do cardiac cath tomorrow at 12 pm.   I have reviewed the risks, indications, and alternatives to cardiac catheterization and possible angioplasty/stenting with the patient. Risks include but are not limited to bleeding, infection, vascular injury, stroke, myocardial infection, arrhythmia, kidney injury, radiation-related injury in the case of prolonged fluoroscopy use, emergency cardiac surgery, and death. The patient understands the risks of serious complication is low (<6%).    Angelena Form PA-C  [facility]

## 2015-01-24 NOTE — Plan of Care (Signed)
Problem: Fluid Volume: Goal: Ability to maintain a balanced intake and output will improve Outcome: Progressing  Continues with IVF. Currently NPO for procedure.

## 2015-01-25 ENCOUNTER — Inpatient Hospital Stay (HOSPITAL_COMMUNITY): Payer: Medicare Other

## 2015-01-25 ENCOUNTER — Encounter (HOSPITAL_COMMUNITY)
Admission: EM | Disposition: A | Discharge status: Home Health | Payer: Self-pay | Source: Home / Self Care | Attending: Internal Medicine

## 2015-01-25 ENCOUNTER — Encounter (HOSPITAL_COMMUNITY): Payer: Self-pay | Admitting: Cardiology

## 2015-01-25 DIAGNOSIS — I2510 Atherosclerotic heart disease of native coronary artery without angina pectoris: Secondary | ICD-10-CM

## 2015-01-25 DIAGNOSIS — R931 Abnormal findings on diagnostic imaging of heart and coronary circulation: Secondary | ICD-10-CM

## 2015-01-25 DIAGNOSIS — N179 Acute kidney failure, unspecified: Secondary | ICD-10-CM | POA: Insufficient documentation

## 2015-01-25 DIAGNOSIS — R778 Other specified abnormalities of plasma proteins: Secondary | ICD-10-CM | POA: Insufficient documentation

## 2015-01-25 DIAGNOSIS — D630 Anemia in neoplastic disease: Secondary | ICD-10-CM

## 2015-01-25 DIAGNOSIS — R7989 Other specified abnormal findings of blood chemistry: Secondary | ICD-10-CM

## 2015-01-25 HISTORY — PX: CARDIAC CATHETERIZATION: SHX172

## 2015-01-25 LAB — BASIC METABOLIC PANEL
ANION GAP: 7 (ref 5–15)
BUN: 16 mg/dL (ref 6–20)
CALCIUM: 9.1 mg/dL (ref 8.9–10.3)
CO2: 22 mmol/L (ref 22–32)
Chloride: 110 mmol/L (ref 101–111)
Creatinine, Ser: 1.38 mg/dL — ABNORMAL HIGH (ref 0.44–1.00)
GFR, EST AFRICAN AMERICAN: 42 mL/min — AB (ref >60)
GFR, EST NON AFRICAN AMERICAN: 36 mL/min — AB (ref >60)
Glucose, Bld: 107 mg/dL — ABNORMAL HIGH (ref 65–99)
POTASSIUM: 3.3 mmol/L — AB (ref 3.5–5.1)
SODIUM: 139 mmol/L (ref 135–145)

## 2015-01-25 LAB — CBC
HCT: 24.9 % — ABNORMAL LOW (ref 36.0–46.0)
Hemoglobin: 8.3 g/dL — ABNORMAL LOW (ref 12.0–15.0)
MCH: 30.9 pg (ref 26.0–34.0)
MCHC: 33.3 g/dL (ref 30.0–36.0)
MCV: 92.6 fL (ref 78.0–100.0)
PLATELETS: 257 10*3/uL (ref 150–400)
RBC: 2.69 MIL/uL — AB (ref 3.87–5.11)
RDW: 16.2 % — ABNORMAL HIGH (ref 11.5–15.5)
WBC: 17.1 10*3/uL — AB (ref 4.0–10.5)

## 2015-01-25 LAB — PROTIME-INR
INR: 1.07 (ref 0.00–1.49)
PROTHROMBIN TIME: 14.1 s (ref 11.6–15.2)

## 2015-01-25 LAB — LIPID PANEL
CHOL/HDL RATIO: 3.7 ratio
CHOLESTEROL: 163 mg/dL (ref 0–200)
HDL: 44 mg/dL (ref >40)
LDL Cholesterol: 95 mg/dL (ref 0–99)
TRIGLYCERIDES: 121 mg/dL (ref <150)
VLDL: 24 mg/dL (ref 0–40)

## 2015-01-25 LAB — OCCULT BLOOD X 1 CARD TO LAB, STOOL: Fecal Occult Bld: NEGATIVE

## 2015-01-25 LAB — HEPARIN LEVEL (UNFRACTIONATED): HEPARIN UNFRACTIONATED: 0.44 [IU]/mL (ref 0.30–0.70)

## 2015-01-25 SURGERY — LEFT HEART CATH AND CORONARY ANGIOGRAPHY

## 2015-01-25 MED ORDER — SODIUM CHLORIDE 0.9 % WEIGHT BASED INFUSION: 1.0000 mL/kg/h | INTRAVENOUS | Status: AC

## 2015-01-25 MED ORDER — FENTANYL CITRATE (PF) 100 MCG/2ML IJ SOLN
INTRAMUSCULAR | Status: AC
  Filled 2015-01-25: qty 2

## 2015-01-25 MED ORDER — LIDOCAINE HCL (PF) 1 % IJ SOLN
INTRAMUSCULAR | Status: AC
  Filled 2015-01-25: qty 30

## 2015-01-25 MED ORDER — VERAPAMIL HCL 2.5 MG/ML IV SOLN
INTRAVENOUS | Status: DC | PRN
  Administered 2015-01-25: 14:00:00 via INTRA_ARTERIAL

## 2015-01-25 MED ORDER — SODIUM CHLORIDE 0.9 % WEIGHT BASED INFUSION
1.0000 mL/kg/h | INTRAVENOUS | Status: DC
  Administered 2015-01-25: 1 mL/kg/h via INTRAVENOUS

## 2015-01-25 MED ORDER — FENTANYL CITRATE (PF) 100 MCG/2ML IJ SOLN
INTRAMUSCULAR | Status: DC | PRN
  Administered 2015-01-25 (×2): 25 ug via INTRAVENOUS

## 2015-01-25 MED ORDER — MIDAZOLAM HCL 2 MG/2ML IJ SOLN
INTRAMUSCULAR | Status: AC
  Filled 2015-01-25: qty 2

## 2015-01-25 MED ORDER — HEPARIN (PORCINE) IN NACL 100-0.45 UNIT/ML-% IJ SOLN
1300.0000 [IU]/h | INTRAMUSCULAR | Status: DC
  Administered 2015-01-25: 1300 [IU]/h via INTRAVENOUS
  Filled 2015-01-25: qty 250

## 2015-01-25 MED ORDER — HEPARIN SODIUM (PORCINE) 1000 UNIT/ML IJ SOLN
INTRAMUSCULAR | Status: AC
  Filled 2015-01-25: qty 1

## 2015-01-25 MED ORDER — VERAPAMIL HCL 2.5 MG/ML IV SOLN
INTRAVENOUS | Status: AC
  Filled 2015-01-25: qty 2

## 2015-01-25 MED ORDER — POTASSIUM CHLORIDE CRYS ER 20 MEQ PO TBCR
40.0000 meq | EXTENDED_RELEASE_TABLET | Freq: Every day | ORAL | Status: DC
  Administered 2015-01-25 – 2015-01-27 (×3): 40 meq via ORAL
  Filled 2015-01-25 (×3): qty 2

## 2015-01-25 MED ORDER — SODIUM CHLORIDE 0.9 % IV SOLN: 250.0000 mL | INTRAVENOUS | Status: DC | PRN

## 2015-01-25 MED ORDER — IOHEXOL 350 MG/ML SOLN
INTRAVENOUS | Status: DC | PRN
  Administered 2015-01-25: 50 mL via INTRA_ARTERIAL

## 2015-01-25 MED ORDER — SODIUM CHLORIDE 0.9 % IJ SOLN: 3.0000 mL | Freq: Two times a day (BID) | INTRAMUSCULAR | Status: DC

## 2015-01-25 MED ORDER — LIDOCAINE HCL (PF) 1 % IJ SOLN
INTRAMUSCULAR | Status: DC | PRN
  Administered 2015-01-25: 15:00:00

## 2015-01-25 MED ORDER — MIDAZOLAM HCL 2 MG/2ML IJ SOLN
INTRAMUSCULAR | Status: DC | PRN
  Administered 2015-01-25: 1 mg via INTRAVENOUS

## 2015-01-25 MED ORDER — SODIUM CHLORIDE 0.9 % IJ SOLN: 3.0000 mL | INTRAMUSCULAR | Status: DC | PRN

## 2015-01-25 MED ORDER — HEPARIN (PORCINE) IN NACL 2-0.9 UNIT/ML-% IJ SOLN
INTRAMUSCULAR | Status: AC
  Filled 2015-01-25: qty 1500

## 2015-01-25 MED ORDER — HEPARIN SODIUM (PORCINE) 1000 UNIT/ML IJ SOLN
INTRAMUSCULAR | Status: DC | PRN
  Administered 2015-01-25: 5000 [IU] via INTRAVENOUS

## 2015-01-25 MED ORDER — VERAPAMIL HCL 2.5 MG/ML IV SOLN
INTRAVENOUS | Status: DC | PRN
  Administered 2015-01-25: 5 mg via INTRA_ARTERIAL

## 2015-01-25 SURGICAL SUPPLY — 12 items
CATH INFINITI 5 FR JL3.5 (CATHETERS) ×3 IMPLANT
CATH INFINITI 5FR ANG PIGTAIL (CATHETERS) ×3 IMPLANT
CATH INFINITI JR4 5F (CATHETERS) ×3 IMPLANT
COVER PRB 48X5XTLSCP FOLD TPE (BAG) IMPLANT
COVER PROBE 5X48 (BAG) ×3
DEVICE RAD COMP TR BAND LRG (VASCULAR PRODUCTS) ×3 IMPLANT
GLIDESHEATH SLEND SS 6F .021 (SHEATH) ×3 IMPLANT
KIT HEART LEFT (KITS) ×3 IMPLANT
PACK CARDIAC CATHETERIZATION (CUSTOM PROCEDURE TRAY) ×3 IMPLANT
TRANSDUCER W/STOPCOCK (MISCELLANEOUS) ×3 IMPLANT
TUBING CIL FLEX 10 FLL-RA (TUBING) ×3 IMPLANT
WIRE SAFE-T 1.5MM-J .035X260CM (WIRE) ×3 IMPLANT

## 2015-01-25 NOTE — Progress Notes (Signed)
TR band removed at 16:50. No bleeding or hematoma present. VSS. Will continue to monitor.   Ruben Reason, RN

## 2015-01-25 NOTE — Progress Notes (Signed)
[facility]                                                                              Patient Demographics  Diamond Boyd, is a 74 y.o. female, DOB - 1940-06-16, PYP:[id number]  Admit date - 01/23/2015   Admitting Physician Cristal Ford, DO  Outpatient Primary MD for the patient is [name], MD  LOS - 1   Chief Complaint  Patient presents with  . Shortness of Breath      HPI on 01/23/2015  Diamond Boyd is a 74 y.o. female with a history of systolic CHF, lymphoma, hypothyroidism presented to the emergency department with complaints of shortness of breath. Patient was at her oncology office visit today when she was found to have atrial fibrillation with RVR. Of note, patient was recently admitted to [facility] last week for chemotherapy. She was then admitted to [facility] for congestive heart failure and was discharged on 01/22/2015. Per husband, who is at bedside, patient had 7 L of fluid removed during her hospital course. She currently denies any sick contacts, recent travel. She denies any palpitations. She does have shortness of breath with movement. Patient has also complained of decreased appetite. While in the emergency department, patient was noted to have atrial fibrillation on her EKG. Heart rate did rise to 124. Patient was given metoprolol. TRH was called for admission.  Assessment & Plan  New onset atrial fibrillation -Possibly secondary to dehydration. Patient was recently admitted for CHF and had 7 L of fluid removed. -Has remained rate controlled, transitioned to PO metoprolol -CHADSVASC 5 (based on Gender, age, CHF, HTN) -Patient on heparin drip, will likely need to be transitioned to coumadin -Cardiology consulted and appreciated- ordered FOBT which was neg, would like GI consult before started coumadin -TSH 7.24, FT4 0.79 (WNL) -Continue to replace potassium  Acute kidney injury -Baseline creatinine of 0.6. Cr was 1.44 upon  admission. -Cr 1.38 today -Likely secondary to diuresis -Continue to monitor BMP -Obtain renal US  Elevated troponin -Upon admission, 0.20- trending downward 0.05  -Trending downward from previous admission at [facility]. -Likely secondary to demand ischemia from her CHF exacerbation in her last admission versus atrial fibrillation with RVR -lexiscan myoview was intermediate risk, medial size infarct in mid anterior septal, apical anterior, septal walls -Plan for cardiac catheterization today  Chronic systolic heart failure -Echocardiogram 01/21/2015 showed an EF of 40-45% -Will hold Lasix for the time being. -Monitor intake and output, daily weights  History of colon cancer/lymphoma -Dr. Alvy Bimler made aware of patient's admission- spoke with her regarding WBC -She recently received Neulasta today. She had chemotherapy Thursday, Friday, Saturday this past week.  Leukocytosis -Improving, Likely reactive, patient received Neulasta 01/23/2015 -Chest x-ray shows no infection, small pleural effusions resolved -Patient denies any urinary complaints. -Continue monitor CBC  Hypokalemia -Continue to replace and monitor BMP -Magnesium 2  Hypothyroidism -Continue Synthroid.  -TSH 7.240, FT4 0.79  Depression -Continue Prozac  Hyperlipidemia -Continue statin  GERD -Continue PPI  Code Status: Full  Family Communication: None at bedside  Disposition Plan: Admitted. Pending cath today.  Time Spent in minutes   30 minutes  Procedures  [employer]  Cardiology Oncology  DVT Prophylaxis  Heparin  Lab Results  Component Value Date   PLT 257 01/25/2015    Medications  Scheduled Meds: . acyclovir  400 mg Oral Daily  . aspirin EC  81 mg Oral Daily  . calcium-vitamin D  1 tablet Oral q1800  . cholecalciferol  1,000 Units Oral Daily  . docusate sodium  100 mg Oral QHS  . estradiol  1 mg Oral Daily  . FLUoxetine  20 mg Oral Daily  . fluticasone  2 spray Each  Nare Daily  . gabapentin  300 mg Oral BID  . levothyroxine  112 mcg Oral QAC breakfast  . loratadine  10 mg Oral Daily  . metoprolol  2.5 mg Intravenous 4 times per day  . omega-3 acid ethyl esters  1 g Oral Daily  . pantoprazole  40 mg Oral Daily  . polyethylene glycol  17 g Oral QHS  . potassium chloride SA  40 mEq Oral Daily  . psyllium  1 packet Oral QHS  . simvastatin  40 mg Oral QHS  . sodium chloride  500 mL Intravenous Once  . sodium chloride  10-40 mL Intracatheter Q12H  . sodium chloride  3 mL Intravenous Q12H  . sodium chloride  3 mL Intravenous Q12H  . vitamin E  400 Units Oral Daily   Continuous Infusions: . sodium chloride 1 mL/kg/hr (01/25/15 0840)   PRN Meds:.sodium chloride, acetaminophen **OR** acetaminophen, diphenhydrAMINE, ondansetron **OR** ondansetron (ZOFRAN) IV, prochlorperazine, sodium chloride, sodium chloride  Antibiotics   Anti-infectives    Start     Dose/Rate Route Frequency Ordered Stop   01/23/15 1930  acyclovir (ZOVIRAX) tablet 400 mg     400 mg Oral Daily 01/23/15 1820        Subjective:   Diamond Boyd seen and examined today.  Patient states she is feeling better but is anxious about her cardiac catheterization. Complained of shortness of breath when getting up to go the bathroom. She denies chest pain, palpitations, abdominal pain, dizziness or headache.  Objective:   Filed Vitals:   01/25/15 0300 01/25/15 0343 01/25/15 0538 01/25/15 0941  BP:  137/70 132/60   Pulse:  93 87   Temp:  98.9 F (37.2 C)  99.1 F (37.3 C)  TempSrc:  Oral  Oral  Resp:      Height:      Weight: 105.28 kg (232 lb 1.6 oz)     SpO2:  99% 98%     Wt Readings from Last 3 Encounters:  01/25/15 105.28 kg (232 lb 1.6 oz)  01/23/15 105.87 kg (233 lb 6.4 oz)  01/21/15 106.595 kg (235 lb)     Intake/Output Summary (Last 24 hours) at 01/25/15 1158 Last data filed at 01/25/15 0815  Gross per 24 hour  Intake 1229.05 ml  Output   1500 ml  Net -270.95 ml     Exam  General: Well developed, well nourished, NAD  HEENT: NCAT, mucous membranes moist.   Cardiovascular: S1 S2 auscultated, irregular, 1/6 SEM  Respiratory: Clear to auscultation bilaterally   Abdomen: Soft, nontender, nondistended, + bowel sounds  Extremities: warm dry without cyanosis clubbing or edema  Neuro: AAOx3, nonfocal  Psych: Normal affect and demeanor, pleasant  Data Review   Micro Results Recent Results (from the past 240 hour(s))  Culture, blood (routine x 2)     Status: None (Preliminary result)   Collection Time: 01/21/15  3:10 AM  Result Value Ref Range Status   Specimen Description  BLOOD RIGHT HAND  Final   Special Requests BOTTLES DRAWN AEROBIC AND ANAEROBIC 4CC  Final   Culture NO GROWTH 4 DAYS  Final   Report Status PENDING  Incomplete  Culture, blood (routine x 2)     Status: None (Preliminary result)   Collection Time: 01/21/15  3:11 AM  Result Value Ref Range Status   Specimen Description BLOOD LEFT ASSIST CONTROL  Final   Special Requests BOTTLES DRAWN AEROBIC AND ANAEROBIC 4CC  Final   Culture NO GROWTH 4 DAYS  Final   Report Status PENDING  Incomplete  MRSA PCR Screening     Status: None   Collection Time: 01/21/15  8:46 AM  Result Value Ref Range Status   MRSA by PCR NEGATIVE NEGATIVE Final    Comment:        The GeneXpert MRSA Assay (FDA approved for NASAL specimens only), is one component of a comprehensive MRSA colonization surveillance program. It is not intended to diagnose MRSA infection nor to guide or monitor treatment for MRSA infections.     Radiology Reports Dg Chest 2 View  01/23/2015  CLINICAL DATA:  Shortness of breath for 1 week. History of colon carcinoma and lymphoma EXAM: CHEST  2 VIEW COMPARISON:  January 22, 2015. FINDINGS: Lungs are now clear. Heart is borderline prominent with pulmonary vascularity within normal limits. Port-A-Cath tip is in the superior vena cava. No adenopathy. No blastic or lytic bone  lesions. There is degenerative change in the thoracic spine and each shoulder. IMPRESSION: Lungs are now clear. Small pleural effusions have resolved. No new opacity. Heart borderline prominent but stable. Electronically Signed   By: Lowella Grip III M.D.   On: 01/23/2015 13:26   Dg Chest 2 View  01/22/2015  CLINICAL DATA:  Recent discharge from the hospital having completed chemotherapy for Hodgkin's lymphoma, new onset of shortness of breath, chest tightness, and burning sensation. EXAM: CHEST  2 VIEW COMPARISON:  CT scan of the chest of January 21, 2015 and chest x-ray of the same day FINDINGS: The lungs are adequately inflated. There are small bilateral pleural effusions layering inferiorly. There is atelectasis or developing infiltrate at the left lung base. There is no alveolar infiltrate or pneumothorax. The heart is mildly enlarged. The pulmonary vascularity is normal. The power port appliance tip projects over the midportion of the SVC. There is mild multilevel degenerative disc disease of the thoracic spine with calcification of portions of the anterior longitudinal ligament. IMPRESSION: Small bilateral pleural effusions. Left lower lobe atelectasis or pneumonia. There no pulmonary vascular congestion. Electronically Signed   By: David  Martinique M.D.   On: 01/22/2015 08:49   Ct Angio Chest Pe W/cm &/or Wo Cm  01/21/2015  CLINICAL DATA:  Acute onset of respiratory distress. Tachypnea. Initial encounter. EXAM: CT ANGIOGRAPHY CHEST WITH CONTRAST TECHNIQUE: Multidetector CT imaging of the chest was performed using the standard protocol during bolus administration of intravenous contrast. Multiplanar CT image reconstructions and MIPs were obtained to evaluate the vascular anatomy. CONTRAST:  150m OMNIPAQUE IOHEXOL 350 MG/ML SOLN COMPARISON:  Chest radiograph performed earlier today at 3:03 a.m., and PET/CT performed 12/13/2014 FINDINGS: There is no evidence of pulmonary embolus. Small bilateral  pleural effusions are noted. Interstitial prominence is noted, raising concern for mild interstitial edema. Mild bibasilar atelectasis is seen. There is no evidence of pneumothorax. No masses are identified; no abnormal focal contrast enhancement is seen. Diffuse coronary artery calcifications are seen. Vague nonspecific soft tissue inflammation is noted about the  distal trachea and azygoesophageal region. No pericardial effusion is identified. No mediastinal lymphadenopathy is seen. The great vessels are grossly unremarkable in appearance. No axillary lymphadenopathy is seen. The visualized portions of the thyroid gland are unremarkable in appearance. A right-sided chest port is noted ending about the distal SVC. The visualized portions of the liver and spleen are unremarkable. No acute osseous abnormalities are seen. There is mild chronic osseous fusion at T6-T7. Review of the MIP images confirms the above findings. IMPRESSION: 1. No evidence of pulmonary embolus. 2. Small bilateral pleural effusions noted. Interstitial prominence, raising concern for mild interstitial edema. Mild bibasilar atelectasis seen. 3. Diffuse coronary artery calcification noted. 4. Vague nonspecific soft tissue inflammation about the distal trachea and azygoesophageal region. Electronically Signed   By: Garald Balding M.D.   On: 01/21/2015 06:58   US Renal  01/25/2015  CLINICAL DATA:  Acute renal failure. EXAM: RENAL / URINARY TRACT ULTRASOUND COMPLETE COMPARISON:  PET-CT 12/13/2014. FINDINGS: Right Kidney: Length: 13.9 cm. Echogenicity within normal limits. No mass or hydronephrosis visualized. Left Kidney: Length: 14.3 cm. Echogenicity within normal limits. No mass or hydronephrosis visualized. Bladder: Appears normal for degree of bladder distention. IMPRESSION: Normal exam. Electronically Signed   By: Marcello Moores  Register   On: 01/25/2015 09:27   Nm Myocar Multi W/spect W/wall Motion / Ef  01/24/2015   There was no ST segment  deviation noted during stress.  No T wave inversion was noted during stress.  Defect 1: There is a medium defect of mild severity present in the mid anterior, mid anteroseptal, apical anterior and apical septal location.  Findings consistent with prior myocardial infarction with peri-infarct ischemia.  This is an intermediate risk study.  The left ventricular ejection fraction is moderately decreased (30-44%).  There is a medium size infarct in the mid anteroseptal, apical anterior, septal walls with minimal peri-infarct ischemia in the mid anterior wall (SDS = 2).   Dg Chest Port 1 View  01/21/2015  CLINICAL DATA:  Acute onset of respiratory distress. Initial encounter. EXAM: PORTABLE CHEST 1 VIEW COMPARISON:  Chest radiograph performed 12/30/2014 FINDINGS: The lungs are well-aerated. Vascular congestion is noted. Bibasilar airspace opacities raise concern for mild pulmonary edema. Small bilateral pleural effusions are suspected. No pneumothorax is seen. There is no evidence of pneumothorax. The cardiomediastinal silhouette is mildly enlarged. A right-sided chest port is noted ending about the mid SVC. No acute osseous abnormalities are seen. IMPRESSION: Vascular congestion and mild cardiomegaly. Bibasilar airspace opacities raise concern for mild pulmonary edema. Small bilateral pleural effusions suspected. Electronically Signed   By: Garald Balding M.D.   On: 01/21/2015 03:22   Dg Chest Port 1 View  12/30/2014  CLINICAL DATA:  Shortness of breath. EXAM: PORTABLE CHEST 1 VIEW COMPARISON:  Chest radiograph 11/01/2014.  PET-CT 12/13/2014 FINDINGS: Tip of the right chest port in the SVC. Mild cardiomegaly is unchanged. Development of mild pulmonary edema. Question left pleural effusion. Ill-defined linear opacities at the lung bases, likely atelectasis. No pneumothorax. No confluent airspace disease. IMPRESSION: 1. Development of pulmonary edema.  Suspect left pleural effusion. 2. Stable mild  cardiomegaly. Electronically Signed   By: Jeb Levering M.D.   On: 12/30/2014 05:57    CBC  Recent Labs Lab 01/21/15 0310 01/22/15 0410 01/23/15 1602 01/24/15 0855 01/25/15 0442  WBC 8.7 8.0 17.2* 41.9* 17.1*  HGB 11.8* 10.3* 10.0* 9.4* 8.3*  HCT 35.2 30.4* 29.5* 28.0* 24.9*  PLT 601* 500* 476* 354 257  MCV 95.2 94.0 90.8 92.4  92.6  MCH 31.8 31.7 30.8 31.0 30.9  MCHC 33.4 33.7 33.9 33.6 33.3  RDW 14.9* 14.9* 16.0* 16.2* 16.2*  LYMPHSABS 2.6  --   --  0.8  --   MONOABS 0.5  --   --  0.0*  --   EOSABS 0.0  --   --  0.0  --   BASOSABS 0.0  --   --  0.0  --     Chemistries   Recent Labs Lab 01/21/15 0310 01/22/15 0410 01/23/15 1602 01/23/15 2100 01/24/15 0855 01/25/15 0442  NA 139 138 138  --  139 139  K 3.9 3.3* 3.3*  --  3.3* 3.3*  CL 110 106 106  --  110 110  CO2 22 27 20*  --  24 22  GLUCOSE 136* 98 116*  --  110* 107*  BUN 13 23* 21*  --  19 16  CREATININE 0.67 0.90 1.44*  --  1.39* 1.38*  CALCIUM 9.0 9.0 10.2  --  9.3 9.1  MG  --   --   --  2.0  --   --   AST 73*  --   --   --  32  --   ALT 72*  --   --   --  38  --   ALKPHOS 169*  --   --   --  160*  --   BILITOT 0.7  --   --   --  1.0  --    ------------------------------------------------------------------------------------------------------------------ estimated creatinine clearance is 43.2 mL/min (by C-G formula based on Cr of 1.38). ------------------------------------------------------------------------------------------------------------------ No results for input(s): HGBA1C in the last 72 hours. ------------------------------------------------------------------------------------------------------------------  Recent Labs  01/25/15 0442  CHOL 163  HDL 44  LDLCALC 95  TRIG 121  CHOLHDL 3.7   ------------------------------------------------------------------------------------------------------------------  Recent Labs  01/23/15 2100  TSH 7.240*    ------------------------------------------------------------------------------------------------------------------ No results for input(s): VITAMINB12, FOLATE, FERRITIN, TIBC, IRON, RETICCTPCT in the last 72 hours.  Coagulation profile  Recent Labs Lab 01/25/15 0442  INR 1.07    No results for input(s): DDIMER in the last 72 hours.  Cardiac Enzymes  Recent Labs Lab 01/23/15 2100 01/24/15 0301 01/24/15 0855  TROPONINI 0.06* 0.05* 0.05*   ------------------------------------------------------------------------------------------------------------------ Invalid input(s): POCBNP    Griffin Dewilde D.O. on 01/25/2015 at 11:58 AM  Between 7am to 7pm - Pager - [phone]  After 7pm go to [email or web address] - password [email or web address]  And look for the night coverage person covering for me after hours  [facility] Office  [phone]

## 2015-01-25 NOTE — Plan of Care (Signed)
Problem: Phase I Progression Outcomes Goal: Pain controlled with appropriate interventions Outcome: Progressing Denies pain at this time.

## 2015-01-25 NOTE — H&P (View-Only) (Signed)
Patient Name: Diamond Boyd Date of Encounter: 01/25/2015   SUBJECTIVE  Feeling weak. Had sob while walking to bathroom this morning along with elevated rate on telemety. Hx of colon cancer 2011 s/p 2/3 of colon removal.   CURRENT MEDS . acyclovir  400 mg Oral Daily  . aspirin  81 mg Oral Pre-Cath  . aspirin EC  81 mg Oral Daily  . calcium-vitamin D  1 tablet Oral q1800  . cholecalciferol  1,000 Units Oral Daily  . docusate sodium  100 mg Oral QHS  . estradiol  1 mg Oral Daily  . FLUoxetine  20 mg Oral Daily  . fluticasone  2 spray Each Nare Daily  . gabapentin  300 mg Oral BID  . levothyroxine  112 mcg Oral QAC breakfast  . loratadine  10 mg Oral Daily  . metoprolol  2.5 mg Intravenous 4 times per day  . omega-3 acid ethyl esters  1 g Oral Daily  . pantoprazole  40 mg Oral Daily  . polyethylene glycol  17 g Oral QHS  . potassium chloride SA  20 mEq Oral Daily  . psyllium  1 packet Oral QHS  . simvastatin  40 mg Oral QHS  . sodium chloride  500 mL Intravenous Once  . sodium chloride  10-40 mL Intracatheter Q12H  . sodium chloride  3 mL Intravenous Q12H  . sodium chloride  3 mL Intravenous Q12H  . vitamin E  400 Units Oral Daily    OBJECTIVE  Filed Vitals:   01/25/15 0016 01/25/15 0300 01/25/15 0343 01/25/15 0538  BP:   137/70 132/60  Pulse: 110  93 87  Temp:   98.9 F (37.2 C)   TempSrc:   Oral   Resp:      Height:      Weight:  232 lb 1.6 oz (105.28 kg)    SpO2:   99% 98%    Intake/Output Summary (Last 24 hours) at 01/25/15 0820 Last data filed at 01/25/15 0800  Gross per 24 hour  Intake 1326.93 ml  Output   1000 ml  Net 326.93 ml   Filed Weights   01/23/15 1848 01/24/15 0422 01/25/15 0300  Weight: 232 lb 8 oz (105.461 kg) 232 lb 1.6 oz (105.28 kg) 232 lb 1.6 oz (105.28 kg)    PHYSICAL EXAM  General: Pleasant, female in NAD. Neuro: Alert and oriented X 3. Moves all extremities spontaneously. Psych: Normal affect. HEENT:  Normal  Neck:  Supple without bruits. + JVD? Difficult to assess due to girth.  Lungs:  Resp regular and unlabored, CTA. Heart: RRR no s3, s4. 1/6 systolic murmurs. Abdomen: Soft, non-tender, non-distended, BS + x 4.  Extremities: No clubbing, cyanosis or edema. DP/PT/Radials 2+ and equal bilaterally.  Accessory Clinical Findings  CBC  Recent Labs  01/24/15 0855 01/25/15 0442  WBC 41.9* 17.1*  NEUTROABS 41.1*  --   HGB 9.4* 8.3*  HCT 28.0* 24.9*  MCV 92.4 92.6  PLT 354 510   Basic Metabolic Panel  Recent Labs  01/23/15 2100 01/24/15 0855 01/25/15 0442  NA  --  139 139  K  --  3.3* 3.3*  CL  --  110 110  CO2  --  24 22  GLUCOSE  --  110* 107*  BUN  --  19 16  CREATININE  --  1.39* 1.38*  CALCIUM  --  9.3 9.1  MG 2.0  --   --    Liver Function Tests  Recent Labs  01/24/15 0855  AST 32  ALT 38  ALKPHOS 160*  BILITOT 1.0  PROT 5.3*  ALBUMIN 3.1*   No results for input(s): LIPASE, AMYLASE in the last 72 hours. Cardiac Enzymes  Recent Labs  01/23/15 2100 01/24/15 0301 01/24/15 0855  TROPONINI 0.06* 0.05* 0.05*   Thyroid Function Tests  Recent Labs  01/23/15 2100  TSH 7.240*    TELE  afib rate mostly in 90s to upper 100s. At time elevates to 140s with exertion.   Radiology/Studies  Dg Chest 2 View  01/23/2015  CLINICAL DATA:  Shortness of breath for 1 week. History of colon carcinoma and lymphoma EXAM: CHEST  2 VIEW COMPARISON:  January 22, 2015. FINDINGS: Lungs are now clear. Heart is borderline prominent with pulmonary vascularity within normal limits. Port-A-Cath tip is in the superior vena cava. No adenopathy. No blastic or lytic bone lesions. There is degenerative change in the thoracic spine and each shoulder. IMPRESSION: Lungs are now clear. Small pleural effusions have resolved. No new opacity. Heart borderline prominent but stable. Electronically Signed   By: Lowella Grip III M.D.   On: 01/23/2015 13:26   Dg Chest 2 View  01/22/2015  CLINICAL  DATA:  Recent discharge from the hospital having completed chemotherapy for Hodgkin's lymphoma, new onset of shortness of breath, chest tightness, and burning sensation. EXAM: CHEST  2 VIEW COMPARISON:  CT scan of the chest of January 21, 2015 and chest x-ray of the same day FINDINGS: The lungs are adequately inflated. There are small bilateral pleural effusions layering inferiorly. There is atelectasis or developing infiltrate at the left lung base. There is no alveolar infiltrate or pneumothorax. The heart is mildly enlarged. The pulmonary vascularity is normal. The power port appliance tip projects over the midportion of the SVC. There is mild multilevel degenerative disc disease of the thoracic spine with calcification of portions of the anterior longitudinal ligament. IMPRESSION: Small bilateral pleural effusions. Left lower lobe atelectasis or pneumonia. There no pulmonary vascular congestion. Electronically Signed   By: David  Martinique M.D.   On: 01/22/2015 08:49   Ct Angio Chest Pe W/cm &/or Wo Cm  01/21/2015  CLINICAL DATA:  Acute onset of respiratory distress. Tachypnea. Initial encounter. EXAM: CT ANGIOGRAPHY CHEST WITH CONTRAST TECHNIQUE: Multidetector CT imaging of the chest was performed using the standard protocol during bolus administration of intravenous contrast. Multiplanar CT image reconstructions and MIPs were obtained to evaluate the vascular anatomy. CONTRAST:  163m OMNIPAQUE IOHEXOL 350 MG/ML SOLN COMPARISON:  Chest radiograph performed earlier today at 3:03 a.m., and PET/CT performed 12/13/2014 FINDINGS: There is no evidence of pulmonary embolus. Small bilateral pleural effusions are noted. Interstitial prominence is noted, raising concern for mild interstitial edema. Mild bibasilar atelectasis is seen. There is no evidence of pneumothorax. No masses are identified; no abnormal focal contrast enhancement is seen. Diffuse coronary artery calcifications are seen. Vague nonspecific soft  tissue inflammation is noted about the distal trachea and azygoesophageal region. No pericardial effusion is identified. No mediastinal lymphadenopathy is seen. The great vessels are grossly unremarkable in appearance. No axillary lymphadenopathy is seen. The visualized portions of the thyroid gland are unremarkable in appearance. A right-sided chest port is noted ending about the distal SVC. The visualized portions of the liver and spleen are unremarkable. No acute osseous abnormalities are seen. There is mild chronic osseous fusion at T6-T7. Review of the MIP images confirms the above findings. IMPRESSION: 1. No evidence of pulmonary embolus. 2. Small bilateral pleural effusions noted. Interstitial  prominence, raising concern for mild interstitial edema. Mild bibasilar atelectasis seen. 3. Diffuse coronary artery calcification noted. 4. Vague nonspecific soft tissue inflammation about the distal trachea and azygoesophageal region. Electronically Signed   By: Garald Balding M.D.   On: 01/21/2015 06:58    Nm Myocar Multi W/spect W/wall Motion / Ef  01/24/2015   There was no ST segment deviation noted during stress.  No T wave inversion was noted during stress.  Defect 1: There is a medium defect of mild severity present in the mid anterior, mid anteroseptal, apical anterior and apical septal location.  Findings consistent with prior myocardial infarction with peri-infarct ischemia.  This is an intermediate risk study.  The left ventricular ejection fraction is moderately decreased (30-44%).  There is a medium size infarct in the mid anteroseptal, apical anterior, septal walls with minimal peri-infarct ischemia in the mid anterior wall (SDS = 2).   Dg Chest Port 1 View  01/21/2015  CLINICAL DATA:  Acute onset of respiratory distress. Initial encounter. EXAM: PORTABLE CHEST 1 VIEW COMPARISON:  Chest radiograph performed 12/30/2014 FINDINGS: The lungs are well-aerated. Vascular congestion is noted.  Bibasilar airspace opacities raise concern for mild pulmonary edema. Small bilateral pleural effusions are suspected. No pneumothorax is seen. There is no evidence of pneumothorax. The cardiomediastinal silhouette is mildly enlarged. A right-sided chest port is noted ending about the mid SVC. No acute osseous abnormalities are seen. IMPRESSION: Vascular congestion and mild cardiomegaly. Bibasilar airspace opacities raise concern for mild pulmonary edema. Small bilateral pleural effusions suspected. Electronically Signed   By: Garald Balding M.D.   On: 01/21/2015 03:22   Dg Chest Port 1 View  12/30/2014  CLINICAL DATA:  Shortness of breath. EXAM: PORTABLE CHEST 1 VIEW COMPARISON:  Chest radiograph 11/01/2014.  PET-CT 12/13/2014 FINDINGS: Tip of the right chest port in the SVC. Mild cardiomegaly is unchanged. Development of mild pulmonary edema. Question left pleural effusion. Ill-defined linear opacities at the lung bases, likely atelectasis. No pneumothorax. No confluent airspace disease. IMPRESSION: 1. Development of pulmonary edema.  Suspect left pleural effusion. 2. Stable mild cardiomegaly. Electronically Signed   By: Jeb Levering M.D.   On: 12/30/2014 05:57    ASSESSMENT AND PLAN 1. New onset a-fib ([place]) with RVR - CHADSVASCs score of at least 5 (age, sex, HTN and CHF).  - On IV lopressor, rate mostly in 90s to upper 100s. However elevated to 140s with exertion. Will switch to po metoprolol tartrate 6m BID and titrate up after cath.  - On IV heparin. Pharmacist recommended transition to warfarin is best options given active cancer.  Recent echo showed EF of 45-50% with mild MR. Echo in 2015 which showed an ejection fraction at 55-60% with grade 1 diastolic dysfunction - Consider DCCV after 1 months of anticoagulation if not converted by her self.  - TSH high, Free T4 normal.   [facility]. [facility] as above.    - Myoview showed intermediate risk study.  - BNP 622.7 on admission. Given Iv fluid due to dehydration. Now on rate on 125 ml/hours. JVD difficult to assess due to grith however ? Elevated JVD. Will reduce rate to 767mhours prior to cath.   3. AKI - creatinine remain stable to 1.39-->1.38 on IV hydration.  - HEr EGFR was >60 - 01/21/2015 now 36. Follow closely.   4. Hypokalemia - K of 3.3 today as well. Will increase Kdur to 4082m  5. Elevated troponin - trending down from last admission. Flat trend. For cath today.   6. Hypertension -Stable  7. HL - Will get lipid panel today.  8. Hypothyroidism - Elevated TSH and normal free T4.  - Per primary  9. Anemia - patient has hx of anemia in past f/u evaluation reveled Colon Cancer s/p 2/3 colectomy 2011. The patient denies melena or blood in her stool. Will get stool gauiac prior to cath. Follow closely as she is on IV heparin.  - Hgb of 11.8 -01/21/2015; 10.3 - 01/22/2015; 10.0-12/13/2016; 9.4-12/14/2016;  Today 8.3. If positive stool gauiac, will need GI eval prior to cath.   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 Agree with assessment and plan. No chest pain. Results of nuclear study discussed with patient. Stool guiac is negative. Will hold heparin now and plan for diagnostic cath today; recommend GI eval; prior to initiation of long term anticoagulation. The risks and benefits of a cardiac catheterization including, but not limited to, death, stroke, MI, kidney damage and bleeding were discussed with the patient who indicates understanding and agrees to proceed.    Troy Sine, MD, [facility] 01/25/2015 10:29 AM

## 2015-01-25 NOTE — Progress Notes (Signed)
 Patient Name: Diamond Boyd Date of Encounter: 01/25/2015   SUBJECTIVE  Feeling weak. Had sob while walking to bathroom this morning along with elevated rate on telemety. Hx of colon cancer 2011 s/p 2/3 of colon removal.   CURRENT MEDS . acyclovir  400 mg Oral Daily  . aspirin  81 mg Oral Pre-Cath  . aspirin EC  81 mg Oral Daily  . calcium-vitamin D  1 tablet Oral q1800  . cholecalciferol  1,000 Units Oral Daily  . docusate sodium  100 mg Oral QHS  . estradiol  1 mg Oral Daily  . FLUoxetine  20 mg Oral Daily  . fluticasone  2 spray Each Nare Daily  . gabapentin  300 mg Oral BID  . levothyroxine  112 mcg Oral QAC breakfast  . loratadine  10 mg Oral Daily  . metoprolol  2.5 mg Intravenous 4 times per day  . omega-3 acid ethyl esters  1 g Oral Daily  . pantoprazole  40 mg Oral Daily  . polyethylene glycol  17 g Oral QHS  . potassium chloride SA  20 mEq Oral Daily  . psyllium  1 packet Oral QHS  . simvastatin  40 mg Oral QHS  . sodium chloride  500 mL Intravenous Once  . sodium chloride  10-40 mL Intracatheter Q12H  . sodium chloride  3 mL Intravenous Q12H  . sodium chloride  3 mL Intravenous Q12H  . vitamin E  400 Units Oral Daily    OBJECTIVE  Filed Vitals:   01/25/15 0016 01/25/15 0300 01/25/15 0343 01/25/15 0538  BP:   137/70 132/60  Pulse: 110  93 87  Temp:   98.9 F (37.2 C)   TempSrc:   Oral   Resp:      Height:      Weight:  232 lb 1.6 oz (105.28 kg)    SpO2:   99% 98%    Intake/Output Summary (Last 24 hours) at 01/25/15 0820 Last data filed at 01/25/15 0800  Gross per 24 hour  Intake 1326.93 ml  Output   1000 ml  Net 326.93 ml   Filed Weights   01/23/15 1848 01/24/15 0422 01/25/15 0300  Weight: 232 lb 8 oz (105.461 kg) 232 lb 1.6 oz (105.28 kg) 232 lb 1.6 oz (105.28 kg)    PHYSICAL EXAM  General: Pleasant, female in NAD. Neuro: Alert and oriented X 3. Moves all extremities spontaneously. Psych: Normal affect. HEENT:  Normal  Neck:  Supple without bruits. + JVD? Difficult to assess due to girth.  Lungs:  Resp regular and unlabored, CTA. Heart: RRR no s3, s4. 1/6 systolic murmurs. Abdomen: Soft, non-tender, non-distended, BS + x 4.  Extremities: No clubbing, cyanosis or edema. DP/PT/Radials 2+ and equal bilaterally.  Accessory Clinical Findings  CBC  Recent Labs  01/24/15 0855 01/25/15 0442  WBC 41.9* 17.1*  NEUTROABS 41.1*  --   HGB 9.4* 8.3*  HCT 28.0* 24.9*  MCV 92.4 92.6  PLT 354 257   Basic Metabolic Panel  Recent Labs  01/23/15 2100 01/24/15 0855 01/25/15 0442  NA  --  139 139  K  --  3.3* 3.3*  CL  --  110 110  CO2  --  24 22  GLUCOSE  --  110* 107*  BUN  --  19 16  CREATININE  --  1.39* 1.38*  CALCIUM  --  9.3 9.1  MG 2.0  --   --    Liver Function Tests  Recent Labs    01/24/15 0855  AST 32  ALT 38  ALKPHOS 160*  BILITOT 1.0  PROT 5.3*  ALBUMIN 3.1*   No results for input(s): LIPASE, AMYLASE in the last 72 hours. Cardiac Enzymes  Recent Labs  01/23/15 2100 01/24/15 0301 01/24/15 0855  TROPONINI 0.06* 0.05* 0.05*   Thyroid Function Tests  Recent Labs  01/23/15 2100  TSH 7.240*    TELE  afib rate mostly in 90s to upper 100s. At time elevates to 140s with exertion.   Radiology/Studies  Dg Chest 2 View  01/23/2015  CLINICAL DATA:  Shortness of breath for 1 week. History of colon carcinoma and lymphoma EXAM: CHEST  2 VIEW COMPARISON:  January 22, 2015. FINDINGS: Lungs are now clear. Heart is borderline prominent with pulmonary vascularity within normal limits. Port-A-Cath tip is in the superior vena cava. No adenopathy. No blastic or lytic bone lesions. There is degenerative change in the thoracic spine and each shoulder. IMPRESSION: Lungs are now clear. Small pleural effusions have resolved. No new opacity. Heart borderline prominent but stable. Electronically Signed   By: William  Woodruff III M.D.   On: 01/23/2015 13:26   Dg Chest 2 View  01/22/2015  CLINICAL  DATA:  Recent discharge from the hospital having completed chemotherapy for Hodgkin's lymphoma, new onset of shortness of breath, chest tightness, and burning sensation. EXAM: CHEST  2 VIEW COMPARISON:  CT scan of the chest of January 21, 2015 and chest x-ray of the same day FINDINGS: The lungs are adequately inflated. There are small bilateral pleural effusions layering inferiorly. There is atelectasis or developing infiltrate at the left lung base. There is no alveolar infiltrate or pneumothorax. The heart is mildly enlarged. The pulmonary vascularity is normal. The power port appliance tip projects over the midportion of the SVC. There is mild multilevel degenerative disc disease of the thoracic spine with calcification of portions of the anterior longitudinal ligament. IMPRESSION: Small bilateral pleural effusions. Left lower lobe atelectasis or pneumonia. There no pulmonary vascular congestion. Electronically Signed   By: David  Jordan M.D.   On: 01/22/2015 08:49   Ct Angio Chest Pe W/cm &/or Wo Cm  01/21/2015  CLINICAL DATA:  Acute onset of respiratory distress. Tachypnea. Initial encounter. EXAM: CT ANGIOGRAPHY CHEST WITH CONTRAST TECHNIQUE: Multidetector CT imaging of the chest was performed using the standard protocol during bolus administration of intravenous contrast. Multiplanar CT image reconstructions and MIPs were obtained to evaluate the vascular anatomy. CONTRAST:  100mL OMNIPAQUE IOHEXOL 350 MG/ML SOLN COMPARISON:  Chest radiograph performed earlier today at 3:03 a.m., and PET/CT performed 12/13/2014 FINDINGS: There is no evidence of pulmonary embolus. Small bilateral pleural effusions are noted. Interstitial prominence is noted, raising concern for mild interstitial edema. Mild bibasilar atelectasis is seen. There is no evidence of pneumothorax. No masses are identified; no abnormal focal contrast enhancement is seen. Diffuse coronary artery calcifications are seen. Vague nonspecific soft  tissue inflammation is noted about the distal trachea and azygoesophageal region. No pericardial effusion is identified. No mediastinal lymphadenopathy is seen. The great vessels are grossly unremarkable in appearance. No axillary lymphadenopathy is seen. The visualized portions of the thyroid gland are unremarkable in appearance. A right-sided chest port is noted ending about the distal SVC. The visualized portions of the liver and spleen are unremarkable. No acute osseous abnormalities are seen. There is mild chronic osseous fusion at T6-T7. Review of the MIP images confirms the above findings. IMPRESSION: 1. No evidence of pulmonary embolus. 2. Small bilateral pleural effusions noted. Interstitial   prominence, raising concern for mild interstitial edema. Mild bibasilar atelectasis seen. 3. Diffuse coronary artery calcification noted. 4. Vague nonspecific soft tissue inflammation about the distal trachea and azygoesophageal region. Electronically Signed   By: Jeffery  Chang M.D.   On: 01/21/2015 06:58    Nm Myocar Multi W/spect W/wall Motion / Ef  01/24/2015   There was no ST segment deviation noted during stress.  No T wave inversion was noted during stress.  Defect 1: There is a medium defect of mild severity present in the mid anterior, mid anteroseptal, apical anterior and apical septal location.  Findings consistent with prior myocardial infarction with peri-infarct ischemia.  This is an intermediate risk study.  The left ventricular ejection fraction is moderately decreased (30-44%).  There is a medium size infarct in the mid anteroseptal, apical anterior, septal walls with minimal peri-infarct ischemia in the mid anterior wall (SDS = 2).   Dg Chest Port 1 View  01/21/2015  CLINICAL DATA:  Acute onset of respiratory distress. Initial encounter. EXAM: PORTABLE CHEST 1 VIEW COMPARISON:  Chest radiograph performed 12/30/2014 FINDINGS: The lungs are well-aerated. Vascular congestion is noted.  Bibasilar airspace opacities raise concern for mild pulmonary edema. Small bilateral pleural effusions are suspected. No pneumothorax is seen. There is no evidence of pneumothorax. The cardiomediastinal silhouette is mildly enlarged. A right-sided chest port is noted ending about the mid SVC. No acute osseous abnormalities are seen. IMPRESSION: Vascular congestion and mild cardiomegaly. Bibasilar airspace opacities raise concern for mild pulmonary edema. Small bilateral pleural effusions suspected. Electronically Signed   By: Jeffery  Chang M.D.   On: 01/21/2015 03:22   Dg Chest Port 1 View  12/30/2014  CLINICAL DATA:  Shortness of breath. EXAM: PORTABLE CHEST 1 VIEW COMPARISON:  Chest radiograph 11/01/2014.  PET-CT 12/13/2014 FINDINGS: Tip of the right chest port in the SVC. Mild cardiomegaly is unchanged. Development of mild pulmonary edema. Question left pleural effusion. Ill-defined linear opacities at the lung bases, likely atelectasis. No pneumothorax. No confluent airspace disease. IMPRESSION: 1. Development of pulmonary edema.  Suspect left pleural effusion. 2. Stable mild cardiomegaly. Electronically Signed   By: Melanie  Ehinger M.D.   On: 12/30/2014 05:57    ASSESSMENT AND PLAN 1. New onset a-fib ([facility]) with RVR - CHADSVASCs score of at least 5 (age, sex, HTN and CHF).  - On IV lopressor, rate mostly in 90s to upper 100s. However elevated to 140s with exertion. Will switch to po metoprolol tartrate 25mg BID and titrate up after cath.  - On IV heparin. Pharmacist recommended transition to warfarin is best options given active cancer.  Recent echo showed EF of 45-50% with mild MR. Echo in 2015 which showed an ejection fraction at 55-60% with grade 1 diastolic dysfunction - Consider DCCV after 1 months of anticoagulation if not converted by her self.  - TSH high, Free T4 normal.   2. LV dysfunction - Recently admitted to [facility] for respiratory failure due to volume overload. Echo as above.    - Myoview showed intermediate risk study.  - BNP 622.7 on admission. Given Iv fluid due to dehydration. Now on rate on 125 ml/hours. JVD difficult to assess due to grith however ? Elevated JVD. Will reduce rate to 75ml/hours prior to cath.   3. AKI - creatinine remain stable to 1.39-->1.38 on IV hydration.  - HEr EGFR was >60 - 01/21/2015 now 36. Follow closely.   4. Hypokalemia - K of 3.3 today as well. Will increase Kdur to 40meq.     5. Elevated troponin - trending down from last admission. Flat trend. For cath today.   6. Hypertension -Stable  7. HL - Will get lipid panel today.  8. Hypothyroidism - Elevated TSH and normal free T4.  - Per primary  9. Anemia - patient has hx of anemia in past f/u evaluation reveled Colon Cancer s/p 2/3 colectomy 2011. The patient denies melena or blood in her stool. Will get stool gauiac prior to cath. Follow closely as she is on IV heparin.  - Hgb of 11.8 -01/21/2015; 10.3 - 01/22/2015; 10.0-12/13/2016; 9.4-12/14/2016;  Today 8.3. If positive stool gauiac, will need GI eval prior to cath.   10. History of B-cell lymphoma  Signed, Bhagat,Bhavinkumar PA-C Pager [phone]  Patient seen and examined. Agree with assessment and plan. No chest pain. Results of nuclear study discussed with patient. Stool guiac is negative. Will hold heparin now and plan for diagnostic cath today; recommend GI eval; prior to initiation of long term anticoagulation. The risks and benefits of a cardiac catheterization including, but not limited to, death, stroke, MI, kidney damage and bleeding were discussed with the patient who indicates understanding and agrees to proceed.    Omarr Hann A. Tashika Goodin, MD, FACC 01/25/2015 10:29 AM   

## 2015-01-25 NOTE — Progress Notes (Addendum)
[facility] for Heparin Indication: atrial fibrillation  Patient Measurements: Heparin Dosing Weight: 88 kg  Vital Signs: Temp: 98.9 F (37.2 C) (12/15 0343) Temp Source: Oral (12/15 0343) BP: 132/60 mmHg (12/15 0538) Pulse Rate: 87 (12/15 0538)  Labs:  Recent Labs  01/23/15 1602 01/23/15 2100 01/24/15 0301 01/24/15 0855 01/24/15 1135 01/25/15 0359 01/25/15 0442  HGB 10.0*  --   --  9.4*  --   --  8.3*  HCT 29.5*  --   --  28.0*  --   --  24.9*  PLT 476*  --   --  354  --   --  257  LABPROT  --   --   --   --   --   --  14.1  INR  --   --   --   --   --   --  1.07  HEPARINUNFRC  --   --  0.41  --  0.52 0.44  --   CREATININE 1.44*  --   --  1.39*  --   --  1.38*  TROPONINI  --  0.06* 0.05* 0.05*  --   --   --     Assessment: 68 yoF admitted from oncologist office to [facility] with afib with RVR. PMHx sx for colon cancer/lymphoma currently undergoing chemotherapy treatment and recent admission to OSH for CHF. No anticoagulation PTA. CBC stable. Pt is therapeutic on current rate of heparin. CBC stable.   Goal of Therapy:  Heparin level 0.3-0.7 units/ml Monitor platelets by anticoagulation protocol: Yes    Plan:  -Continue heparin gtt at 1300 units/hr -Daily HL, CBC -F/u transition to PO anticoag, warfarin likely best option given patient is active cancer patient undergoing chemotherapy    Thank you for allowing [place] to participate in this patient's care.    Hughes Better, PharmD, BCPS Clinical Pharmacist Pager: [phone] 01/25/2015 8:22 AM    Restart heparin 8 hours after sheath removal ~2300 Check HL in 8 hours Monitor s/sx bleeding   Hughes Better, PharmD, BCPS Clinical Pharmacist 01/25/2015 3:27 PM

## 2015-01-25 NOTE — Interval H&P Note (Signed)
History and Physical Interval Note:  01/25/2015 1:54 PM  Diamond Boyd  has presented today for surgery, with the diagnosis of abnormal nuc  The various methods of treatment have been discussed with the patient and family. After consideration of risks, benefits and other options for treatment, the patient has consented to  Procedure(s): Left Heart Cath and Coronary Angiography (N/A) as a surgical intervention .  The patient's history has been reviewed, patient examined, no change in status, stable for surgery.  I have reviewed the patient's chart and labs.  Questions were answered to the patient's satisfaction.   Cath Lab Visit (complete for each Cath Lab visit)  Clinical Evaluation Leading to the Procedure:   ACS: Yes.    Non-ACS:    Anginal Classification: CCS III  Anti-ischemic medical therapy: Minimal Therapy (1 class of medications)  Non-Invasive Test Results: Intermediate-risk stress test findings: cardiac mortality 1-3%/year  Prior CABG: No previous CABG        Diamond Boyd [facility] 01/25/2015 1:54 PM

## 2015-01-25 NOTE — Consult Note (Signed)
[facility] Consult: 12:07 PM 01/25/2015  LOS: 1 day    Referring Provider: Dr Ree Kida  Primary Care Physician:  [name], MD Primary Gastroenterologist:  Dr. Deatra Ina     Reason for Consultation:  Clearance to start long term anticoagulation.    HPI: Diamond Boyd is a 74 y.o. female.  Hx CHF (EF 45 -45%), hypothyroidism.  Colon cancer (synchronous transverse and sigmoid masses) in remission, s/p extended left colon resection 2011. 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  Hodgkin's lymphoma of right chest (tx with AVD 05/2013 - 11/2013).  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  Chronic anemia, blood tranfusion x 2 PRBCs 06/2013.  Remote lap chole.  09/05/14 colonoscopy, surveillance.  4 mm, sessile ascending polyp; 2 mm, flat rectal polyp, int rrhoids.  Path: - HYPERPLASTIC POLYP. - BENIGN POLYPOID COLORECTAL MUCOSA WITH LYMPHOID AGGREGATES - THERE IS NO EVIDENCE OF MALIGNANCY. 09/2010 Colonoscopy with polypectomy Pathology: - BENIGN LYMPHOID POLYP (X1). - NO DYSPLASIA OR MALIGNANCY IDENTIFIED. No EGD found though may have undergone remotely.   [facility] admission 12/11-12/12/16 where she underwent 7 liter diuresis for CHF.   At oncology visit 12/13 was found to be in new afib, rate in 120s.  C/o dyspnea.   Labs reveal AKI (attributed to dehydration of recent diuresis). Current Hgb 8.3, down from 9.4 yesterday, baseline 10-11 in Nov/2016. But ~12.5-13  In 06/2014-10/2014. FOBT is negative.  Cardiology is planning cath today.   Due to hx of colon cancer, cardiology requests GI clearance to start long term anticoagulation.  She is on Heparin gtt.   Pt  denies GERD sxs, abdominal pain, constipation, diarrhea, black/maroon/bloody stool, drastic weight loss.  Appetite is poor. Some nausea, not necessarily coinciding with chemo and no heaving or vomiting.    Past Medical History  Diagnosis Date  . Colon polyps   . Depression   . Hypothyroidism   . Osteoarthritis     hands  . Peripheral vascular disease ([place])   . Degenerative disk disease     spine in center in past   . 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 Cancer ([place])     skin cancer- basal cell on arm / colon 2011  . Lymphoma ([place])   . Colon cancer ([place])     2011, s/p surgery, in remission  . 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 hodgkins lymphoma   . Heart murmur   . GERD (gastroesophageal reflux disease)   . Anemia     hx blood tx  . Pneumonia   . 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 Hypertension     Past Surgical History  Procedure Laterality Date  . Cholecystectomy    . Splenectomy      lymphoma  . Breast biopsy  1996  . Plantar fascia release    . Ventral  hernia repair  1998  . Tendon release      Right thumb   . Colectomy  8/11  . Vaginal hysterectomy    . Bone marrow biopsy  05/27/13  . Cataract extraction, bilateral  2016  . Port-a-cath insertion    . Colonoscopy w/ biopsies    . Dg biopsy lung    . Video bronchoscopy with endobronchial ultrasound N/A 10/17/2014    Procedure: VIDEO BRONCHOSCOPY WITH ENDOBRONCHIAL ULTRASOUND;  Surgeon: Javier Glazier, MD;  Location: [facility];  Service: Thoracic;  Laterality: N/A;  . Video bronchoscopy with endobronchial ultrasound N/A 11/01/2014    Procedure: VIDEO BRONCHOSCOPY WITH ENDOBRONCHIAL ULTRASOUND;  Surgeon: Grace Isaac, MD;  Location: [facility];  Service: Thoracic;  Laterality: N/A;  . Mediastinoscopy N/A 11/01/2014    Procedure: MEDIASTINOSCOPY;  Surgeon: Grace Isaac, MD;  Location: [facility];   Service: Thoracic;  Laterality: N/A;  . Lymph node biopsy N/A 11/01/2014    Procedure: MEDIASTINAL LYMPH NODE BIOPSY;  Surgeon: Grace Isaac, MD;  Location: [facility];  Service: Thoracic;  Laterality: N/A;    Prior to Admission medications   Medication Sig Start Date End Date Taking? Authorizing Provider  acetaminophen (TYLENOL) 650 MG CR tablet Take 650 mg by mouth every 8 (eight) hours as needed (For back pain.).   Yes Historical Provider, MD  acyclovir (ZOVIRAX) 400 MG tablet Take 400 mg by mouth daily.   Yes Historical Provider, MD  Calcium Carbonate-Vitamin D (CALCIUM 600+D) 600-400 MG-UNIT per tablet Take 1 tablet by mouth every evening.    Yes Historical Provider, MD  celecoxib (CELEBREX) 200 MG capsule Take 200 mg by mouth 2 (two) times daily.   Yes Historical Provider, MD  Cholecalciferol (VITAMIN D-3) 1000 UNITS CAPS Take 1,000 Units by mouth daily.   Yes Historical Provider, MD  docusate sodium (COLACE) 100 MG capsule Take 100 mg by mouth at bedtime.   Yes Historical Provider, MD  estradiol (ESTRACE) 1 MG tablet Take 1 mg by mouth daily.   Yes Historical Provider, MD  FLUoxetine (PROZAC) 20 MG capsule Take 20 mg by mouth daily.   Yes Historical Provider, MD  fluticasone (FLONASE) 50 MCG/ACT nasal spray Place 2 sprays into both nostrils daily.   Yes Historical Provider, MD  furosemide (LASIX) 20 MG tablet Take 1 tablet (20 mg total) by mouth daily. 01/22/15  Yes Fritzi Mandes, MD  gabapentin (NEURONTIN) 300 MG capsule Take 300 mg by mouth 2 (two) times daily.   Yes Historical Provider, MD  levothyroxine (SYNTHROID, LEVOTHROID) 112 MCG tablet Take 112 mcg by mouth daily before breakfast.   Yes Historical Provider, MD  lidocaine-prilocaine (EMLA) cream Apply 1 application topically as needed (For port-a-cath.).   Yes Historical Provider, MD  loratadine (CLARITIN) 10 MG tablet Take 10 mg by mouth daily.   Yes Historical Provider, MD  Omega-3 350 MG CAPS Take 1 capsule by mouth daily.   Yes  Historical Provider, MD  omeprazole (PRILOSEC) 40 MG capsule Take 40 mg by mouth daily.   Yes Historical Provider, MD  ondansetron (ZOFRAN) 8 MG tablet Take 8 mg by mouth every 6 (six) hours as needed for nausea or vomiting.  12/25/14  Yes Historical Provider, MD  polyethylene glycol (MIRALAX / GLYCOLAX) packet Take 17 g by mouth at bedtime.   Yes Historical Provider, MD  potassium chloride SA (K-DUR,KLOR-CON) 20 MEQ tablet Take 1 tablet (20 mEq total) by mouth daily. 01/22/15  Yes Fritzi Mandes, MD  prochlorperazine (COMPAZINE) 10  MG tablet Take 10 mg by mouth every 6 (six) hours as needed for nausea or vomiting.  12/25/14  Yes Historical Provider, MD  psyllium (METAMUCIL) 58.6 % packet Take 1-3 packets by mouth at bedtime.    Yes Historical Provider, MD  simvastatin (ZOCOR) 40 MG tablet Take 40 mg by mouth at bedtime. 01/08/15  Yes Historical Provider, MD  vitamin E (VITAMIN E) 400 UNIT capsule Take 400 Units by mouth daily.    Yes Historical Provider, MD    Scheduled Meds: . acyclovir  400 mg Oral Daily  . aspirin EC  81 mg Oral Daily  . calcium-vitamin D  1 tablet Oral q1800  . cholecalciferol  1,000 Units Oral Daily  . docusate sodium  100 mg Oral QHS  . estradiol  1 mg Oral Daily  . FLUoxetine  20 mg Oral Daily  . fluticasone  2 spray Each Nare Daily  . gabapentin  300 mg Oral BID  . levothyroxine  112 mcg Oral QAC breakfast  . loratadine  10 mg Oral Daily  . metoprolol  2.5 mg Intravenous 4 times per day  . omega-3 acid ethyl esters  1 g Oral Daily  . pantoprazole  40 mg Oral Daily  . polyethylene glycol  17 g Oral QHS  . potassium chloride SA  40 mEq Oral Daily  . psyllium  1 packet Oral QHS  . simvastatin  40 mg Oral QHS  . sodium chloride  500 mL Intravenous Once  . sodium chloride  10-40 mL Intracatheter Q12H  . sodium chloride  3 mL Intravenous Q12H  . sodium chloride  3 mL Intravenous Q12H  . vitamin E  400 Units Oral Daily   Infusions: . sodium chloride 1 mL/kg/hr  (01/25/15 0840)   PRN Meds: sodium chloride, acetaminophen **OR** acetaminophen, diphenhydrAMINE, ondansetron **OR** ondansetron (ZOFRAN) IV, prochlorperazine, sodium chloride, sodium chloride   Allergies as of 01/23/2015 - Review Complete 01/23/2015  Allergen Reaction Noted  . Achromycin [tetracycline hcl] Other (See Comments) 09/27/2010  . Allopurinol Other (See Comments)   . Astelin [azelastine hcl] Other (See Comments) 04/05/2013  . Cephalexin Other (See Comments) 06/25/2006  . Codeine Other (See Comments) 06/25/2006  . Meloxicam Other (See Comments) 06/25/2006  . Minocycline Other (See Comments) 01/29/2009  . Nabumetone Other (See Comments) 06/25/2006  . Nyquil [pseudoeph-doxylamine-dm-apap] Hives 04/02/2011  . Penicillins Other (See Comments) 06/25/2006  . Sulfa antibiotics Other (See Comments) 04/06/2013  . Zolpidem tartrate Other (See Comments) 06/25/2006  . Buspar [buspirone hcl] Other (See Comments) 05/06/2010  . Ciprofloxacin Rash 06/25/2006    Family History  Problem Relation Age of Onset  . Coronary artery disease Mother   . Alcohol abuse Mother   . Diabetes Mother   . Diabetes Brother   . Fibromyalgia Daughter     chronic pain   . COPD Daughter   . Colon cancer Neg Hx   . Colon polyps Neg Hx   . Stomach cancer Neg Hx   . Rectal cancer Neg Hx   . Ulcerative colitis Neg Hx   . Crohn's disease Neg Hx   . Asthma Daughter   . Rheum arthritis Brother   . Clotting disorder Brother     Social History   Social History  . Marital Status: Married    Spouse Name: N/A  . Number of Children: 2  . Years of Education: N/A   Occupational History  . Retired     Social History Main Topics  . Smoking status: Never  Smoker   . Smokeless tobacco: Never Used     Comment: Second-hand exposure through father.  . Alcohol Use: No  . Drug Use: No  . Sexual Activity: No   Other Topics Concern  . Not on file   Social History Narrative   Originally from [place]. Always  lived in [place]. Previously has traveled to [facility], [place] & up [employer] to [place]. No international travel. No pets currently. Remote parakeet exposure with her children. 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    Lives at home with husband. Weakness due to chemo, but otherwise independant       REVIEW OF SYSTEMS: Constitutional:  Fatigue, weakness ENT:  No nose bleeds Pulm:  + DOE, no cough CV:  LE edema improved.  No chest pain. .  GU:  No hematuria, no frequency GI:  Per HPI.  No dysphagia Heme:  Per HPI.  No excessive bleeding or bruising   Transfusions:  Per HPI Neuro:  No headaches, no peripheral tingling or numbness Derm:  No itching, no rash or sores. Allopecia.  Endocrine:  No sweats or chills.  No polyuria or dysuria Immunization:  Flu shot is current.  Travel:  None beyond local counties in last few months.    PHYSICAL EXAM: Vital signs in last 24 hours: Filed Vitals:   01/25/15 0538 01/25/15 0941  BP: 132/60   Pulse: 87   Temp:  99.1 F (37.3 C)  Resp:     Wt Readings from Last 3 Encounters:  01/25/15 105.28 kg (232 lb 1.6 oz)  01/23/15 105.87 kg (233 lb 6.4 oz)  01/21/15 106.595 kg (235 lb)    General: pleasant, obese, comfortable WF. Head:  No as  Eyes:  No icterus or conj pallor Ears:  Not HOH  Nose:  No congestion or discharge Mouth:  Clear, moist MM.  Neck:  No mass, no JVD, no TMG. Lungs:  Clear bil.  No adventitious sounds.  Porta cath on right chest.  Heart: RRR.  No mrg.  S1/S2. Abdomen:  Soft, NT, ND.  No mass or HSM. BS hypoactive but no high-pitched or tympanitic BS.   Rectal: deferred   Musc/Skeltl: no joint swelling, redness or deformity.   Extremities:  No CCE.    Neurologic:  Oriented x 3, alert, moves all 4s.  No tremors.  No limb weakness.  Skin:  No sores, rash, telangectasia.  Tattoos:  none Nodes:  No cervical adenopathy.    Psych:  Pleasant, not obviously depressed: just looks tired.   Intake/Output from previous  day: 12/14 0701 - 12/15 0700 In: 1326.9 [P.O.:290; I.V.:1036.9] Out: 1000 [Urine:1000] Intake/Output this shift: Total I/O In: 0  Out: 500 [Urine:500]  LAB RESULTS:  Recent Labs  01/23/15 1602 01/24/15 0855 01/25/15 0442  WBC 17.2* 41.9* 17.1*  HGB 10.0* 9.4* 8.3*  HCT 29.5* 28.0* 24.9*  PLT 476* 354 257   BMET Lab Results  Component Value Date   NA 139 01/25/2015   NA 139 01/24/2015   NA 138 01/23/2015   K 3.3* 01/25/2015   K 3.3* 01/24/2015   K 3.3* 01/23/2015   CL 110 01/25/2015   CL 110 01/24/2015   CL 106 01/23/2015   CO2 22 01/25/2015   CO2 24 01/24/2015   CO2 20* 01/23/2015   GLUCOSE 107* 01/25/2015   GLUCOSE 110* 01/24/2015   GLUCOSE 116* 01/23/2015   BUN 16 01/25/2015   BUN 19 01/24/2015   BUN 21* 01/23/2015   CREATININE 1.38*  01/25/2015   CREATININE 1.39* 01/24/2015   CREATININE 1.44* 01/23/2015   CALCIUM 9.1 01/25/2015   CALCIUM 9.3 01/24/2015   CALCIUM 10.2 01/23/2015   LFT  Recent Labs  01/24/15 0855  PROT 5.3*  ALBUMIN 3.1*  AST 32  ALT 38  ALKPHOS 160*  BILITOT 1.0   PT/INR Lab Results  Component Value Date   INR 1.07 01/25/2015   INR 1.05 11/01/2014   INR 1.0 10/04/2014    RADIOLOGY STUDIES: Dg Chest 2 View  01/23/2015  CLINICAL DATA:  Shortness of breath for 1 week. History of colon carcinoma and lymphoma EXAM: CHEST  2 VIEW COMPARISON:  January 22, 2015. FINDINGS: Lungs are now clear. Heart is borderline prominent with pulmonary vascularity within normal limits. Port-A-Cath tip is in the superior vena cava. No adenopathy. No blastic or lytic bone lesions. There is degenerative change in the thoracic spine and each shoulder. IMPRESSION: Lungs are now clear. Small pleural effusions have resolved. No new opacity. Heart borderline prominent but stable. Electronically Signed   By: Lowella Grip III M.D.   On: 01/23/2015 13:26   US Renal  01/25/2015  CLINICAL DATA:  Acute renal failure. EXAM: RENAL / URINARY TRACT ULTRASOUND  COMPLETE COMPARISON:  PET-CT 12/13/2014. FINDINGS: Right Kidney: Length: 13.9 cm. Echogenicity within normal limits. No mass or hydronephrosis visualized. Left Kidney: Length: 14.3 cm. Echogenicity within normal limits. No mass or hydronephrosis visualized. Bladder: Appears normal for degree of bladder distention. IMPRESSION: Normal exam. Electronically Signed   By: Marcello Moores  Register   On: 01/25/2015 09:27   Nm Myocar Multi W/spect W/wall Motion / Ef  01/24/2015   There was no ST segment deviation noted during stress.  No T wave inversion was noted during stress.  Defect 1: There is a medium defect of mild severity present in the mid anterior, mid anteroseptal, apical anterior and apical septal location.  Findings consistent with prior myocardial infarction with peri-infarct ischemia.  This is an intermediate risk study.  The left ventricular ejection fraction is moderately decreased (30-44%).  There is a medium size infarct in the mid anteroseptal, apical anterior, septal walls with minimal peri-infarct ischemia in the mid anterior wall (SDS = 2).    ENDOSCOPIC STUDIES: Per HPI  IMPRESSION:   *  New onset Afib.   *  CHF. 7 liter diuresis at [facility] 12/11-12/12.  Elevated Troponins at recent [facility] admission.  Newly reduced EF.  For cardiac cath today.   *  Non hodgkin's lymphoma 1990s, and 11/2014.  Received round 2 of chemo last week. Hodgkin's lymphoma 2015.   *  Hx synchronous colon cancer. Resection 2011. Up to date (08/2014) on colonoscopy screening.   *  Anemia, normocytic.  ? Due to chemo.   FOBT negative and no hx that implies GI bleeding.  Received Neulasta 12/12, WBCs jumped from 8 to 41.  Marland Kitchen    PLAN:     *  Cardiac cath this afternoon.  *  Need for further endoscopic workup?: per Dr Carlean Purl.     Azucena Freed  01/25/2015, 12:07 PM Pager: [phone]       [facility] Attending   I have taken an interval history, reviewed the chart and examined the patient. I agree with  the Advanced Practitioner's note, impression and recommendations.    She is heme neg and has reason to be anemic with lymphoma, chemoTX and hydration, blood draws etc.  No need for endoscopic evaluation at this time and no reason to avoid anticoagulation  from GI perspective.  Call if other ?  Gatha Mayer, MD, [facility] [phone] (pager) [phone] after 5 PM, weekends and holidays  01/25/2015 1:59 PM

## 2015-01-26 DIAGNOSIS — Z7901 Long term (current) use of anticoagulants: Secondary | ICD-10-CM

## 2015-01-26 DIAGNOSIS — I5041 Acute combined systolic (congestive) and diastolic (congestive) heart failure: Secondary | ICD-10-CM

## 2015-01-26 LAB — BASIC METABOLIC PANEL
Anion gap: 7 (ref 5–15)
BUN: 13 mg/dL (ref 6–20)
CALCIUM: 8.8 mg/dL — AB (ref 8.9–10.3)
CO2: 20 mmol/L — ABNORMAL LOW (ref 22–32)
CREATININE: 1.12 mg/dL — AB (ref 0.44–1.00)
Chloride: 110 mmol/L (ref 101–111)
GFR calc Af Amer: 54 mL/min — ABNORMAL LOW (ref >60)
GFR, EST NON AFRICAN AMERICAN: 47 mL/min — AB (ref >60)
Glucose, Bld: 95 mg/dL (ref 65–99)
Potassium: 3.2 mmol/L — ABNORMAL LOW (ref 3.5–5.1)
SODIUM: 137 mmol/L (ref 135–145)

## 2015-01-26 LAB — CULTURE, BLOOD (ROUTINE X 2)
CULTURE: NO GROWTH
CULTURE: NO GROWTH

## 2015-01-26 LAB — CBC
HCT: 23.5 % — ABNORMAL LOW (ref 36.0–46.0)
Hemoglobin: 8 g/dL — ABNORMAL LOW (ref 12.0–15.0)
MCH: 31.6 pg (ref 26.0–34.0)
MCHC: 34 g/dL (ref 30.0–36.0)
MCV: 92.9 fL (ref 78.0–100.0)
PLATELETS: 183 10*3/uL (ref 150–400)
RBC: 2.53 MIL/uL — AB (ref 3.87–5.11)
RDW: 16 % — ABNORMAL HIGH (ref 11.5–15.5)
WBC: 4.9 10*3/uL (ref 4.0–10.5)

## 2015-01-26 LAB — HEPARIN LEVEL (UNFRACTIONATED): HEPARIN UNFRACTIONATED: 0.64 [IU]/mL (ref 0.30–0.70)

## 2015-01-26 LAB — PROTIME-INR
INR: 1.06 (ref 0.00–1.49)
Prothrombin Time: 14 seconds (ref 11.6–15.2)

## 2015-01-26 MED ORDER — WARFARIN VIDEO: Freq: Once | Status: DC

## 2015-01-26 MED ORDER — METOPROLOL TARTRATE 50 MG PO TABS
50.0000 mg | ORAL_TABLET | Freq: Two times a day (BID) | ORAL | Status: DC
  Administered 2015-01-26 – 2015-01-27 (×3): 50 mg via ORAL
  Filled 2015-01-26 (×3): qty 1

## 2015-01-26 MED ORDER — WARFARIN SODIUM 7.5 MG PO TABS: 7.5000 mg | ORAL_TABLET | Freq: Once | ORAL | Status: DC

## 2015-01-26 MED ORDER — COUMADIN BOOK
Freq: Once | Status: DC
  Filled 2015-01-26: qty 1

## 2015-01-26 MED ORDER — APIXABAN 5 MG PO TABS
5.0000 mg | ORAL_TABLET | Freq: Two times a day (BID) | ORAL | Status: DC
  Administered 2015-01-26 – 2015-01-27 (×3): 5 mg via ORAL
  Filled 2015-01-26 (×3): qty 1

## 2015-01-26 MED ORDER — WARFARIN - PHARMACIST DOSING INPATIENT: Freq: Every day | Status: DC

## 2015-01-26 NOTE — Progress Notes (Signed)
Patient Name: Diamond Boyd Date of Encounter: 01/26/2015   SUBJECTIVE  Feeling well. No chest pain, sob or palpitations.   CURRENT MEDS . acyclovir  400 mg Oral Daily  . apixaban  5 mg Oral BID  . aspirin EC  81 mg Oral Daily  . calcium-vitamin D  1 tablet Oral q1800  . cholecalciferol  1,000 Units Oral Daily  . docusate sodium  100 mg Oral QHS  . estradiol  1 mg Oral Daily  . FLUoxetine  20 mg Oral Daily  . fluticasone  2 spray Each Nare Daily  . gabapentin  300 mg Oral BID  . levothyroxine  112 mcg Oral QAC breakfast  . loratadine  10 mg Oral Daily  . metoprolol tartrate  50 mg Oral BID  . omega-3 acid ethyl esters  1 g Oral Daily  . pantoprazole  40 mg Oral Daily  . polyethylene glycol  17 g Oral QHS  . potassium chloride SA  40 mEq Oral Daily  . psyllium  1 packet Oral QHS  . simvastatin  40 mg Oral QHS  . sodium chloride  500 mL Intravenous Once  . sodium chloride  10-40 mL Intracatheter Q12H  . vitamin E  400 Units Oral Daily    OBJECTIVE  Filed Vitals:   01/25/15 1702 01/25/15 1750 01/25/15 2100 01/26/15 0500  BP: 93/67 137/77 129/61 138/64  Pulse: 102 115 102 103  Temp:   100.1 F (37.8 C) 98.7 F (37.1 C)  TempSrc:   Oral Oral  Resp: '26 19 20 18  '$ Height:      Weight:    233 lb 9.6 oz (105.96 kg)  SpO2: 98% 98% 99% 97%    Intake/Output Summary (Last 24 hours) at 01/26/15 1223 Last data filed at 01/26/15 0900  Gross per 24 hour  Intake   1296 ml  Output   1900 ml  Net   -604 ml   Filed Weights   01/24/15 0422 01/25/15 0300 01/26/15 0500  Weight: 232 lb 1.6 oz (105.28 kg) 232 lb 1.6 oz (105.28 kg) 233 lb 9.6 oz (105.96 kg)    PHYSICAL EXAM  General: Pleasant, female in NAD. Neuro: Alert and oriented X 3. Moves all extremities spontaneously. Psych: Normal affect. HEENT:  Normal  Neck: Supple without bruits. + JVD? Difficult to assess due to girth.  Lungs:  Resp regular and unlabored, CTA. Heart: RRR no s3, s4. 1/6 systolic  murmurs. Abdomen: Soft, non-tender, non-distended, BS + x 4.  Extremities: No clubbing, cyanosis or edema. DP/PT/Radials 2+ and equal bilaterally. R radial cath site without hematoma.   Accessory Clinical Findings  CBC  Recent Labs  01/24/15 0855 01/25/15 0442 01/26/15 0444  WBC 41.9* 17.1* 4.9  NEUTROABS 41.1*  --   --   HGB 9.4* 8.3* 8.0*  HCT 28.0* 24.9* 23.5*  MCV 92.4 92.6 92.9  PLT 354 257 191   Basic Metabolic Panel  Recent Labs  01/23/15 2100  01/25/15 0442 01/26/15 0444  NA  --   < > 139 137  K  --   < > 3.3* 3.2*  CL  --   < > 110 110  CO2  --   < > 22 20*  GLUCOSE  --   < > 107* 95  BUN  --   < > 16 13  CREATININE  --   < > 1.38* 1.12*  CALCIUM  --   < > 9.1 8.8*  MG 2.0  --   --   --   < > =  values in this interval not displayed. Liver Function Tests  Recent Labs  01/24/15 0855  AST 32  ALT 38  ALKPHOS 160*  BILITOT 1.0  PROT 5.3*  ALBUMIN 3.1*   No results for input(s): LIPASE, AMYLASE in the last 72 hours. Cardiac Enzymes  Recent Labs  01/23/15 2100 01/24/15 0301 01/24/15 0855  TROPONINI 0.06* 0.05* 0.05*   Thyroid Function Tests  Recent Labs  01/23/15 2100  TSH 7.240*    TELE  afib rate mostly in 90s to upper 100s. At time elevates to120s   Left Heart Cath and Coronary Angiography    Conclusion     Prox RCA lesion, 40% stenosed.  Prox LAD to Mid LAD lesion, 30% stenosed.  Ost LM lesion, 25% stenosed.  1. Nonobstructive CAD   Plan: medical management.      Radiology/Studies  Dg Chest 2 View  01/23/2015  CLINICAL DATA:  Shortness of breath for 1 week. History of colon carcinoma and lymphoma EXAM: CHEST  2 VIEW COMPARISON:  January 22, 2015. FINDINGS: Lungs are now clear. Heart is borderline prominent with pulmonary vascularity within normal limits. Port-A-Cath tip is in the superior vena cava. No adenopathy. No blastic or lytic bone lesions. There is degenerative change in the thoracic spine and each  shoulder. IMPRESSION: Lungs are now clear. Small pleural effusions have resolved. No new opacity. Heart borderline prominent but stable. Electronically Signed   By: Lowella Grip III M.D.   On: 01/23/2015 13:26   Dg Chest 2 View  01/22/2015  CLINICAL DATA:  Recent discharge from the hospital having completed chemotherapy for Hodgkin's lymphoma, new onset of shortness of breath, chest tightness, and burning sensation. EXAM: CHEST  2 VIEW COMPARISON:  CT scan of the chest of January 21, 2015 and chest x-ray of the same day FINDINGS: The lungs are adequately inflated. There are small bilateral pleural effusions layering inferiorly. There is atelectasis or developing infiltrate at the left lung base. There is no alveolar infiltrate or pneumothorax. The heart is mildly enlarged. The pulmonary vascularity is normal. The power port appliance tip projects over the midportion of the SVC. There is mild multilevel degenerative disc disease of the thoracic spine with calcification of portions of the anterior longitudinal ligament. IMPRESSION: Small bilateral pleural effusions. Left lower lobe atelectasis or pneumonia. There no pulmonary vascular congestion. Electronically Signed   By: David  Martinique M.D.   On: 01/22/2015 08:49   Ct Angio Chest Pe W/cm &/or Wo Cm  01/21/2015  CLINICAL DATA:  Acute onset of respiratory distress. Tachypnea. Initial encounter. EXAM: CT ANGIOGRAPHY CHEST WITH CONTRAST TECHNIQUE: Multidetector CT imaging of the chest was performed using the standard protocol during bolus administration of intravenous contrast. Multiplanar CT image reconstructions and MIPs were obtained to evaluate the vascular anatomy. CONTRAST:  112m OMNIPAQUE IOHEXOL 350 MG/ML SOLN COMPARISON:  Chest radiograph performed earlier today at 3:03 a.m., and PET/CT performed 12/13/2014 FINDINGS: There is no evidence of pulmonary embolus. Small bilateral pleural effusions are noted. Interstitial prominence is noted, raising  concern for mild interstitial edema. Mild bibasilar atelectasis is seen. There is no evidence of pneumothorax. No masses are identified; no abnormal focal contrast enhancement is seen. Diffuse coronary artery calcifications are seen. Vague nonspecific soft tissue inflammation is noted about the distal trachea and azygoesophageal region. No pericardial effusion is identified. No mediastinal lymphadenopathy is seen. The great vessels are grossly unremarkable in appearance. No axillary lymphadenopathy is seen. The visualized portions of the thyroid gland are unremarkable in appearance.  A right-sided chest port is noted ending about the distal SVC. The visualized portions of the liver and spleen are unremarkable. No acute osseous abnormalities are seen. There is mild chronic osseous fusion at T6-T7. Review of the MIP images confirms the above findings. IMPRESSION: 1. No evidence of pulmonary embolus. 2. Small bilateral pleural effusions noted. Interstitial prominence, raising concern for mild interstitial edema. Mild bibasilar atelectasis seen. 3. Diffuse coronary artery calcification noted. 4. Vague nonspecific soft tissue inflammation about the distal trachea and azygoesophageal region. Electronically Signed   By: Garald Balding M.D.   On: 01/21/2015 06:58   US Renal  01/25/2015  CLINICAL DATA:  Acute renal failure. EXAM: RENAL / URINARY TRACT ULTRASOUND COMPLETE COMPARISON:  PET-CT 12/13/2014. FINDINGS: Right Kidney: Length: 13.9 cm. Echogenicity within normal limits. No mass or hydronephrosis visualized. Left Kidney: Length: 14.3 cm. Echogenicity within normal limits. No mass or hydronephrosis visualized. Bladder: Appears normal for degree of bladder distention. IMPRESSION: Normal exam. Electronically Signed   By: Marcello Moores  Register   On: 01/25/2015 09:27   Nm Myocar Multi W/spect W/wall Motion / Ef  01/24/2015   There was no ST segment deviation noted during stress.  No T wave inversion was noted during  stress.  Defect 1: There is a medium defect of mild severity present in the mid anterior, mid anteroseptal, apical anterior and apical septal location.  Findings consistent with prior myocardial infarction with peri-infarct ischemia.  This is an intermediate risk study.  The left ventricular ejection fraction is moderately decreased (30-44%).  There is a medium size infarct in the mid anteroseptal, apical anterior, septal walls with minimal peri-infarct ischemia in the mid anterior wall (SDS = 2).   Dg Chest Port 1 View  01/21/2015  CLINICAL DATA:  Acute onset of respiratory distress. Initial encounter. EXAM: PORTABLE CHEST 1 VIEW COMPARISON:  Chest radiograph performed 12/30/2014 FINDINGS: The lungs are well-aerated. Vascular congestion is noted. Bibasilar airspace opacities raise concern for mild pulmonary edema. Small bilateral pleural effusions are suspected. No pneumothorax is seen. There is no evidence of pneumothorax. The cardiomediastinal silhouette is mildly enlarged. A right-sided chest port is noted ending about the mid SVC. No acute osseous abnormalities are seen. IMPRESSION: Vascular congestion and mild cardiomegaly. Bibasilar airspace opacities raise concern for mild pulmonary edema. Small bilateral pleural effusions suspected. Electronically Signed   By: Garald Balding M.D.   On: 01/21/2015 03:22   Dg Chest Port 1 View  12/30/2014  CLINICAL DATA:  Shortness of breath. EXAM: PORTABLE CHEST 1 VIEW COMPARISON:  Chest radiograph 11/01/2014.  PET-CT 12/13/2014 FINDINGS: Tip of the right chest port in the SVC. Mild cardiomegaly is unchanged. Development of mild pulmonary edema. Question left pleural effusion. Ill-defined linear opacities at the lung bases, likely atelectasis. No pneumothorax. No confluent airspace disease. IMPRESSION: 1. Development of pulmonary edema.  Suspect left pleural effusion. 2. Stable mild cardiomegaly. Electronically Signed   By: Jeb Levering M.D.   On: 12/30/2014  05:57    ASSESSMENT AND PLAN 1. New onset a-fib ([place]) with RVR - CHADSVASCs score of at least 5 (age, sex, HTN and CHF).  - Rate mostly in 90s to upper 100s. However elevates with exertion. Now on  metoprolol tartrate '50mg'$  BID. - Heparin to Elquis per pharmacy. Cleared by GI for anticoagulation.  - Recent echo showed EF of 45-50% with mild MR. Echo in 2015 which showed an ejection fraction at 55-60% with grade 1 diastolic  - Consider DCCV after 1 months of anticoagulation if  not converted by her self.  - TSH high, Free T4 normal.   2. LV dysfunction - Myoview showed intermediate risk study.  - Cath with nonobstructive CAD. Medical management.  - Continue ASA, BB and statin  3. AKI - creatinine improved further.    4. Hypokalemia - On Kdur 27mq. Will give extra dose.   6. Hypertension -Stable  7. HL - 01/25/2015: Cholesterol 163; HDL 44; LDL Cholesterol 95; Triglycerides 121; VLDL 24  - Continue statin  8. Hypothyroidism - Elevated TSH and normal free T4.  - Per primary  9. Anemia - patient has hx of anemia in past f/u evaluation reveled Colon Cancer s/p 2/3 colectomy 2011. The patient denies melena or blood in her stool. Will get stool gauiac prior to cath. Follow closely as she is on IV heparin.  - Hgb of 11.8 -01/21/2015; 10.3 - 01/22/2015; 10.0-12/13/2016; 9.4-12/14/2016;  8.3- 01/25/15. Today 8.0. Negative stool gauiac. Concern for anemia.   10. History of B-cell lymphoma  Signed, Bhagat,Bhavinkumar PA-C Pager [phone]  Patient seen and examined. Agree with assessment and plan. Cath data reviewed; mild non-obstructive CAD with 30% LAD and 40% RCA stenosis.  OK for anticoagulation per GI. To start eliquis today.  Transition to oral lopressor at 50 mg bid for AF rate control [facility] not pharmacologically convert to sinus rhythm. Probable dc tomorrow if stable.   TTroy Sine MD, FJohn [facility]01/26/2015 1:19 PM

## 2015-01-26 NOTE — Progress Notes (Signed)
ANTICOAGULATION CONSULT NOTE  Pharmacy Consult for Heparin/Coumadin Indication: atrial fibrillation  Patient Measurements: Heparin Dosing Weight: 88 kg  Vital Signs: Temp: 98.7 F (37.1 C) (12/16 0500) Temp Source: Oral (12/16 0500) BP: 138/64 mmHg (12/16 0500) Pulse Rate: 103 (12/16 0500)  Labs:  Recent Labs  01/23/15 2100  01/24/15 0301 01/24/15 0855 01/24/15 1135 01/25/15 0359 01/25/15 0442 01/26/15 0444 01/26/15 0657  HGB  --   --   --  9.4*  --   --  8.3* 8.0*  --   HCT  --   --   --  28.0*  --   --  24.9* 23.5*  --   PLT  --   --   --  354  --   --  257 183  --   LABPROT  --   --   --   --   --   --  14.1  --  14.0  INR  --   --   --   --   --   --  1.07  --  1.06  HEPARINUNFRC  --   < > 0.41  --  0.52 0.44  --   --  0.64  CREATININE  --   --   --  1.39*  --   --  1.38* 1.12*  --   TROPONINI 0.06*  --  0.05* 0.05*  --   --   --   --   --   < > = values in this interval not displayed.  Assessment: 48 yoF [facility] oncologist [facility] to [facility] with afib with RVR. Pt on therapeutic heparin for afib. To start coumadin today. Baseline INR 1.06. CBC stable. No bleeding noted.  Goal of Therapy:  Heparin level 0.3-0.7 units/ml Monitor platelets by anticoagulation protocol: Yes   Plan:  -Continue heparin gtt at 1300 units/hr -Daily HL, CBC, INR -Coumadin 7.'5mg'$  tonight -Coumadin educational book and video  Thank you for allowing [place] to participate in this patient's care.  Sherlon Handing, PharmD, BCPS Clinical pharmacist, pager [phone] 01/26/2015 7:27 AM

## 2015-01-26 NOTE — Discharge Summary (Addendum)
Physician Discharge Summary  Diamond Boyd IFO:[id number] DOB: 03/25/1940 DOA: 01/23/2015  PCP: [name], MD  Admit date: 01/23/2015 Discharge date: 01/27/2015  Time spent: 45 minutes  Recommendations for Outpatient Follow-up:  Patient will be discharged to home.  Patient will need to follow up with primary care provider within one week of discharge.  Follow up with Dr. Alvy Bimler, 01/29/2015, repeat CBC and BMP.  Follow up with cardiology in two weeks. Patient should continue medications as prescribed.  Patient should follow a heart healthy diet.   Discharge Diagnoses:  New-onset atrial fibrillation Acute kidney injury Elevated troponin/non-obstructive coronary artery disease Chronic systolic heart failure History of colon cancer and lymphoma Leukocytosis Hypokalemia Hypothyroidism Depression Hyperlipidemia GERD  Discharge Condition: Stable  Diet recommendation: Heart healthy  Filed Weights   01/25/15 0300 01/26/15 0500 01/27/15 0659  Weight: 105.28 kg (232 lb 1.6 oz) 105.96 kg (233 lb 9.6 oz) 106.641 kg (235 lb 1.6 oz)    History of present illness:  HPI on 01/23/2015  Diamond Boyd is a 74 y.o. female with a history of systolic CHF, lymphoma, hypothyroidism presented to the emergency department with complaints of shortness of breath. Patient was at her oncology office visit today when she was found to have atrial fibrillation with RVR. Of note, patient was recently admitted to [facility] long hospital last week for chemotherapy. She was then admitted to [facility] for congestive heart failure and was discharged on 01/22/2015. Per husband, who is at bedside, patient had 7 L of fluid removed during her hospital course. She currently denies any sick contacts, recent travel. She denies any palpitations. She does have shortness of breath with movement. Patient has also complained of decreased appetite. While in the emergency department, patient was noted to have atrial  fibrillation on her EKG. Heart rate did rise to 124. Patient was given metoprolol. TRH was called for admission.  Hospital Course:  New onset atrial fibrillation -Possibly secondary to dehydration. Patient was recently admitted for CHF and had 7 L of fluid removed. -Has remained rate controlled, transitioned to PO metoprolol -CHADSVASC 5 (based on Gender, age, CHF, HTN) -Patient on heparin drip, transitioned to Eliquis -Discussed NOAC with pharmacy and Dr. Alvy Bimler, will start patient on Eliquis '5mg'$  BID -Cardiology consulted and appreciated- ordered FOBT which was neg -Gastroenterology cleared patient for anticoagulation  -TSH 7.24, FT4 0.79 (WNL) -Continue to replace potassium -Patient will need follow-up with cardiology, possible cardioversion in one month if she does not convert to sinus rhythm -Patient started on Lopressor 50 mg twice a day  Acute kidney injury -Baseline creatinine of 0.6. Cr was 1.44 upon admission. -Cr 1.01 today -Likely secondary to diuresis -Renal US normal  Elevated troponin nonobstructive coronary artery disease/ -Upon admission, 0.20- trending downward 0.05  -Trending downward from previous admission at [facility]. -Likely secondary to demand ischemia from her CHF exacerbation in her last admission versus atrial fibrillation with RVR -lexiscan myoview was intermediate risk, medial size infarct in mid anterior septal, apical anterior, septal walls -Patient had cardiac catheterization on 01/25/2015: Proximal RCA lesion 46%, proximal LAD to mid LAD lesion 30%, ostial LM lesion 25% -Recommendations to continue medical management   Chronic systolic heart failure -Echocardiogram 01/21/2015 showed an EF of 40-45% -Lasix for the time being, restart upon discharge -Monitor intake and output, daily weights  History of colon cancer/lymphoma -Dr. Alvy Bimler made aware of patient's admission- spoke with her regarding WBC -She recently received Neulasta today. She had  chemotherapy Thursday, Friday, Saturday this past  week.  Leukocytosis/leukopenia -WBC as high as 40, now 1.4 -patient received Neulasta 01/23/2015 -Chest x-ray shows no infection, small pleural effusions resolved -Patient denies any urinary complaints. -Spoke with Dr. Alvy Bimler, will give Granix 375mg, patient will need to follow up on 12/19  Hypokalemia -Resolved -Magnesium 2  Hypothyroidism -Continue Synthroid.  -TSH 7.240, FT4 0.79  Depression -Continue Prozac  Hyperlipidemia -Continue statin  GERD -Continue PPI  Procedures  Lexiscan Left heart catheterization and coronary angiography  Consults  Cardiology Oncology Gastroenterology  Discharge Exam: Filed Vitals:   01/26/15 2041 01/27/15 0425  BP: 110/58 138/65  Pulse: 100 86  Temp: 100 F (37.8 C) 98.3 F (36.8 C)  Resp: 16 16   Exam  General: Well developed, well nourished, NAD  HEENT: NCAT, mucous membranes moist.   Cardiovascular: S1 S2 auscultated, irregular, 1/6 SEM  Respiratory: Clear to auscultation bilaterally  Abdomen: Soft, nontender, nondistended, + bowel sounds  Extremities: warm dry without cyanosis clubbing or edema  Neuro: AAOx3, nonfocal  Psych: Normal affect and demeanor, pleasant  Discharge Instructions      Discharge Instructions    Discharge instructions    Complete by:  As directed   Patient will be discharged to home.  Patient will need to follow up with primary care provider within one week of discharge.  Follow up with Dr. GAlvy Bimler 01/29/2015, repeat CBC and BMP.  Follow up with cardiology in two weeks. Patient should continue medications as prescribed.  Patient should follow a heart healthy diet.            Medication List    TAKE these medications        acetaminophen 650 MG CR tablet  Commonly known as:  TYLENOL  Take 650 mg by mouth every 8 (eight) hours as needed (For back pain.).     acyclovir 400 MG tablet  Commonly known as:  ZOVIRAX  Take 400  mg by mouth daily.     apixaban 5 MG Tabs tablet  Commonly known as:  ELIQUIS  Take 1 tablet (5 mg total) by mouth 2 (two) times daily.     CALCIUM 600+D 600-400 MG-UNIT tablet  Generic drug:  Calcium Carbonate-Vitamin D  Take 1 tablet by mouth every evening.     celecoxib 200 MG capsule  Commonly known as:  CELEBREX  Take 200 mg by mouth 2 (two) times daily.     docusate sodium 100 MG capsule  Commonly known as:  COLACE  Take 100 mg by mouth at bedtime.     estradiol 1 MG tablet  Commonly known as:  ESTRACE  Take 1 mg by mouth daily.     FLUoxetine 20 MG capsule  Commonly known as:  PROZAC  Take 20 mg by mouth daily.     fluticasone 50 MCG/ACT nasal spray  Commonly known as:  FLONASE  Place 2 sprays into both nostrils daily.     furosemide 20 MG tablet  Commonly known as:  LASIX  Take 1 tablet (20 mg total) by mouth daily.     gabapentin 300 MG capsule  Commonly known as:  NEURONTIN  Take 300 mg by mouth 2 (two) times daily.     levothyroxine 112 MCG tablet  Commonly known as:  SYNTHROID, LEVOTHROID  Take 112 mcg by mouth daily before breakfast.     lidocaine-prilocaine cream  Commonly known as:  EMLA  Apply 1 application topically as needed (For port-a-cath.).     loratadine 10 MG tablet  Commonly known  as:  CLARITIN  Take 10 mg by mouth daily.     metoprolol 50 MG tablet  Commonly known as:  LOPRESSOR  Take 1 tablet (50 mg total) by mouth 2 (two) times daily.     Omega-3 350 MG Caps  Take 1 capsule by mouth daily.     omeprazole 40 MG capsule  Commonly known as:  PRILOSEC  Take 40 mg by mouth daily.     ondansetron 8 MG tablet  Commonly known as:  ZOFRAN  Take 8 mg by mouth every 6 (six) hours as needed for nausea or vomiting.     polyethylene glycol packet  Commonly known as:  MIRALAX / GLYCOLAX  Take 17 g by mouth at bedtime.     potassium chloride SA 20 MEQ tablet  Commonly known as:  K-DUR,KLOR-CON  Take 1 tablet (20 mEq total) by mouth  daily.     prochlorperazine 10 MG tablet  Commonly known as:  COMPAZINE  Take 10 mg by mouth every 6 (six) hours as needed for nausea or vomiting.     psyllium 58.6 % packet  Commonly known as:  METAMUCIL  Take 1-3 packets by mouth at bedtime.     simvastatin 40 MG tablet  Commonly known as:  ZOCOR  Take 40 mg by mouth at bedtime.     Vitamin D-3 1000 UNITS Caps  Take 1,000 Units by mouth daily.     vitamin E 400 UNIT capsule  Take 400 Units by mouth daily.       Allergies  Allergen Reactions  . Achromycin [Tetracycline Hcl] Other (See Comments)    Pt does not remember reaction  . Allopurinol Other (See Comments)    REACTION: Unsure of reaction happene years ago  . Astelin [Azelastine Hcl] Other (See Comments)    Reaction unknown  . Cephalexin Other (See Comments)    REACTION: unsure of reaction happened yrs ago.  . Codeine Other (See Comments)    REACTION: abd. pain  . Meloxicam Other (See Comments)    REACTION: GI symptoms  . Minocycline Other (See Comments)    Abdominal pain  . Nabumetone Other (See Comments)    REACTION: reaction not known  . Nyquil [Pseudoeph-Doxylamine-Dm-Apap] Hives  . Penicillins Other (See Comments)    Reaction unknown Has patient had a PCN reaction causing immediate rash, facial/tongue/throat swelling, SOB or lightheadedness with hypotension: unsure reaction unknown Has patient had a PCN reaction causing severe rash involving mucus membranes or skin necrosis: unsure reaction unknown Has patient had a PCN reaction that required hospitalization no Has patient had a PCN reaction occurring within the last 10 years: no If all of the above answers are "NO", then may proceed with Cephalosporin use.  . Sulfa Antibiotics Other (See Comments)    Gi side eff   . Zolpidem Tartrate Other (See Comments)    REACTION: feels too drugged  . Buspar [Buspirone Hcl] Other (See Comments)    Dizziness, and not as effective for anxiety  . Ciprofloxacin Rash    Follow-up Information    Follow up with [name], MD. Schedule an appointment as soon as possible for a visit in 1 week.   Specialties:  Family Medicine, Radiology   Why:  Hospital follow up   Contact information:   [address] [place] [address] [phone]       Follow up with [facility], NI, MD. Go on 01/29/2015.   Specialty:  Hematology and Oncology  Why:  Repeat CBC and BMP   Contact information:   [place] [id number] [phone]       Follow up with Shelva Majestic A, MD. Schedule an appointment as soon as possible for a visit in 2 weeks.   Specialty:  Cardiology   Why:  Atrial fibrillation   Contact information:   [address] [place] [address] [phone]        The results of significant diagnostics from this hospitalization (including imaging, microbiology, ancillary and laboratory) are listed below for reference.    Significant Diagnostic Studies: Dg Chest 2 View  01/23/2015  CLINICAL DATA:  Shortness of breath for 1 week. History of colon carcinoma and lymphoma EXAM: CHEST  2 VIEW COMPARISON:  January 22, 2015. FINDINGS: Lungs are now clear. Heart is borderline prominent with pulmonary vascularity within normal limits. Port-A-Cath tip is in the superior vena cava. No adenopathy. No blastic or lytic bone lesions. There is degenerative change in the thoracic spine and each shoulder. IMPRESSION: Lungs are now clear. Small pleural effusions have resolved. No new opacity. Heart borderline prominent but stable. Electronically Signed   By: Lowella Grip III M.D.   On: 01/23/2015 13:26   Dg Chest 2 View  01/22/2015  CLINICAL DATA:  Recent discharge from the hospital having completed chemotherapy for Hodgkin's lymphoma, new onset of shortness of breath, chest tightness, and burning sensation. EXAM: CHEST  2 VIEW COMPARISON:  CT scan of the chest of January 21, 2015 and chest x-ray of the  same day FINDINGS: The lungs are adequately inflated. There are small bilateral pleural effusions layering inferiorly. There is atelectasis or developing infiltrate at the left lung base. There is no alveolar infiltrate or pneumothorax. The heart is mildly enlarged. The pulmonary vascularity is normal. The power port appliance tip projects over the midportion of the SVC. There is mild multilevel degenerative disc disease of the thoracic spine with calcification of portions of the anterior longitudinal ligament. IMPRESSION: Small bilateral pleural effusions. Left lower lobe atelectasis or pneumonia. There no pulmonary vascular congestion. Electronically Signed   By: David  Martinique M.D.   On: 01/22/2015 08:49   Ct Angio Chest Pe W/cm &/or Wo Cm  01/21/2015  CLINICAL DATA:  Acute onset of respiratory distress. Tachypnea. Initial encounter. EXAM: CT ANGIOGRAPHY CHEST WITH CONTRAST TECHNIQUE: Multidetector CT imaging of the chest was performed using the standard protocol during bolus administration of intravenous contrast. Multiplanar CT image reconstructions and MIPs were obtained to evaluate the vascular anatomy. CONTRAST:  177m OMNIPAQUE IOHEXOL 350 MG/ML SOLN COMPARISON:  Chest radiograph performed earlier today at 3:03 a.m., and PET/CT performed 12/13/2014 FINDINGS: There is no evidence of pulmonary embolus. Small bilateral pleural effusions are noted. Interstitial prominence is noted, raising concern for mild interstitial edema. Mild bibasilar atelectasis is seen. There is no evidence of pneumothorax. No masses are identified; no abnormal focal contrast enhancement is seen. Diffuse coronary artery calcifications are seen. Vague nonspecific soft tissue inflammation is noted about the distal trachea and azygoesophageal region. No pericardial effusion is identified. No mediastinal lymphadenopathy is seen. The great vessels are grossly unremarkable in appearance. No axillary lymphadenopathy is seen. The visualized  portions of the thyroid gland are unremarkable in appearance. A right-sided chest port is noted ending about the distal SVC. The visualized portions of the liver and spleen are unremarkable. No acute osseous abnormalities are seen. There is mild chronic osseous fusion at T6-T7. Review of the MIP images confirms the above  findings. IMPRESSION: 1. No evidence of pulmonary embolus. 2. Small bilateral pleural effusions noted. Interstitial prominence, raising concern for mild interstitial edema. Mild bibasilar atelectasis seen. 3. Diffuse coronary artery calcification noted. 4. Vague nonspecific soft tissue inflammation about the distal trachea and azygoesophageal region. Electronically Signed   By: Garald Balding M.D.   On: 01/21/2015 06:58   US Renal  01/25/2015  CLINICAL DATA:  Acute renal failure. EXAM: RENAL / URINARY TRACT ULTRASOUND COMPLETE COMPARISON:  PET-CT 12/13/2014. FINDINGS: Right Kidney: Length: 13.9 cm. Echogenicity within normal limits. No mass or hydronephrosis visualized. Left Kidney: Length: 14.3 cm. Echogenicity within normal limits. No mass or hydronephrosis visualized. Bladder: Appears normal for degree of bladder distention. IMPRESSION: Normal exam. Electronically Signed   By: Marcello Moores  Register   On: 01/25/2015 09:27   Nm Myocar Multi W/spect W/wall Motion / Ef  01/24/2015   There was no ST segment deviation noted during stress.  No T wave inversion was noted during stress.  Defect 1: There is a medium defect of mild severity present in the mid anterior, mid anteroseptal, apical anterior and apical septal location.  Findings consistent with prior myocardial infarction with peri-infarct ischemia.  This is an intermediate risk study.  The left ventricular ejection fraction is moderately decreased (30-44%).  There is a medium size infarct in the mid anteroseptal, apical anterior, septal walls with minimal peri-infarct ischemia in the mid anterior wall (SDS = 2).   Dg Chest Port 1  View  01/21/2015  CLINICAL DATA:  Acute onset of respiratory distress. Initial encounter. EXAM: PORTABLE CHEST 1 VIEW COMPARISON:  Chest radiograph performed 12/30/2014 FINDINGS: The lungs are well-aerated. Vascular congestion is noted. Bibasilar airspace opacities raise concern for mild pulmonary edema. Small bilateral pleural effusions are suspected. No pneumothorax is seen. There is no evidence of pneumothorax. The cardiomediastinal silhouette is mildly enlarged. A right-sided chest port is noted ending about the mid SVC. No acute osseous abnormalities are seen. IMPRESSION: Vascular congestion and mild cardiomegaly. Bibasilar airspace opacities raise concern for mild pulmonary edema. Small bilateral pleural effusions suspected. Electronically Signed   By: Garald Balding M.D.   On: 01/21/2015 03:22   Dg Chest Port 1 View  12/30/2014  CLINICAL DATA:  Shortness of breath. EXAM: PORTABLE CHEST 1 VIEW COMPARISON:  Chest radiograph 11/01/2014.  PET-CT 12/13/2014 FINDINGS: Tip of the right chest port in the SVC. Mild cardiomegaly is unchanged. Development of mild pulmonary edema. Question left pleural effusion. Ill-defined linear opacities at the lung bases, likely atelectasis. No pneumothorax. No confluent airspace disease. IMPRESSION: 1. Development of pulmonary edema.  Suspect left pleural effusion. 2. Stable mild cardiomegaly. Electronically Signed   By: Jeb Levering M.D.   On: 12/30/2014 05:57    Microbiology: Recent Results (from the past 240 hour(s))  Culture, blood (routine x 2)     Status: None   Collection Time: 01/21/15  3:10 AM  Result Value Ref Range Status   Specimen Description BLOOD RIGHT HAND  Final   Special Requests BOTTLES DRAWN AEROBIC AND ANAEROBIC 4CC  Final   Culture NO GROWTH 5 DAYS  Final   Report Status 01/26/2015 FINAL  Final  Culture, blood (routine x 2)     Status: None   Collection Time: 01/21/15  3:11 AM  Result Value Ref Range Status   Specimen Description BLOOD  LEFT ASSIST CONTROL  Final   Special Requests BOTTLES DRAWN AEROBIC AND ANAEROBIC 4CC  Final   Culture NO GROWTH 5 DAYS  Final  Report Status 01/26/2015 FINAL  Final  MRSA PCR Screening     Status: None   Collection Time: 01/21/15  8:46 AM  Result Value Ref Range Status   MRSA by PCR NEGATIVE NEGATIVE Final    Comment:        The GeneXpert MRSA Assay (FDA approved for NASAL specimens only), is one component of a comprehensive MRSA colonization surveillance program. It is not intended to diagnose MRSA infection nor to guide or monitor treatment for MRSA infections.      Labs: Basic Metabolic Panel:  Recent Labs Lab 01/23/15 1602 01/23/15 2100 01/24/15 0855 01/25/15 0442 01/26/15 0444 01/27/15 0625  NA 138  --  139 139 137 137  K 3.3*  --  3.3* 3.3* 3.2* 3.7  CL 106  --  110 110 110 109  CO2 20*  --  24 22 20* 23  GLUCOSE 116*  --  110* 107* 95 90  BUN 21*  --  '19 16 13 11  '$ CREATININE 1.44*  --  1.39* 1.38* 1.12* 1.01*  CALCIUM 10.2  --  9.3 9.1 8.8* 9.3  MG  --  2.0  --   --   --   --    Liver Function Tests:  Recent Labs Lab 01/21/15 0310 01/24/15 0855  AST 73* 32  ALT 72* 38  ALKPHOS 169* 160*  BILITOT 0.7 1.0  PROT 7.1 5.3*  ALBUMIN 4.3 3.1*   No results for input(s): LIPASE, AMYLASE in the last 168 hours. No results for input(s): AMMONIA in the last 168 hours. CBC:  Recent Labs Lab 01/21/15 0310  01/23/15 1602 01/24/15 0855 01/25/15 0442 01/26/15 0444 01/27/15 0625  WBC 8.7  < > 17.2* 41.9* 17.1* 4.9 1.4*  NEUTROABS 5.5  --   --  41.1*  --   --   --   HGB 11.8*  < > 10.0* 9.4* 8.3* 8.0* 8.0*  HCT 35.2  < > 29.5* 28.0* 24.9* 23.5* 24.0*  MCV 95.2  < > 90.8 92.4 92.6 92.9 92.3  PLT 601*  < > 476* 354 257 183 116*  < > = values in this interval not displayed. Cardiac Enzymes:  Recent Labs Lab 01/21/15 1436 01/21/15 2121 01/23/15 2100 01/24/15 0301 01/24/15 0855  TROPONINI 0.38* 0.20* 0.06* 0.05* 0.05*   BNP: BNP (last 3  results)  Recent Labs  01/21/15 0310 01/23/15 1602  BNP 514.0* 622.7*    ProBNP (last 3 results) No results for input(s): PROBNP in the last 8760 hours.  CBG: No results for input(s): GLUCAP in the last 168 hours.     SignedCristal Ford  [facility] 01/27/2015, 10:45 AM

## 2015-01-26 NOTE — Plan of Care (Signed)
Problem: Activity: Goal: Ability to tolerate increased activity will improve Outcome: Progressing Patient remains in atrial fibrillation with rates 90 - 115 at rest; mild fatigue, but states she is feeling much better and "ready to go home." With minimal activity, rates quickly escalate to > 160 and patient experiences mild SOB and increased fatigue. Would consider still very intolerant of activity; will need to progress out of bed further in AM.  Developing mild febrile state with elevated WBC (down significantly since last draw); received pegfilgrastim (Neulasta) prior to admission. Complaint of moderate headache at HS; tylenol 650 mg PO given (may mask temperature, will recheck at 04:00). Right chest implanted port is accessed, dressed appropriately and dated.  Dressing intact; change due 01/30/15. No symptoms of infection, although signs of SIRS do exist:  Elevated WBC (likely non-diagnostic; received neulasta prior to admission)  New febrile state  Elevated heart rate with new onset atrial arrhythmia (improving with low-dose IV beta blocker)  Labile BP (recent IV fluid boluses given for low SBP, now stable)  Peripheral edema (improving with IV lasix, ? neulasta - see "leaky capillary syndrome")  Patient is also post-cardiac catheterization without acute problems at this time. Right wrist catheterization site is stable, level 0. Signs reported above possibly also related to inflammatory response post-catheterization; will notify MD if persistent or escalating overnight. Documented for MD review and follow-up if indicated.  Recent vital signs: Filed Vitals:    01/25/15 1619 01/25/15 1702 01/25/15 1750 01/25/15 2100  BP: 104/54 93/67 137/77 129/61  Pulse: 103 102 115 102  Temp:       100.1 F (37.8 C)  TempSrc:       Oral  Resp: '23 26 19 20  '$ Height:          Weight:          SpO2: 98% 98% 98% 99%   Patient and family educated at bedside re:  Plans of care (atrial  arrhythmia, cardiac catheterization)  Basic heart failure principles  Unit routines (RN lab draws via port)  Tests/procedures (none currently upcoming)  Activity progression  Post cardiac-catheterization site care/extremity elevation  Signs/symptoms of site problems; who to call  Medications  IV metoprolol  pegfilgrastim (Neulasta)  Gabapentin  Diphenhydramine  IV heparin (resumed post-cath at 23:00)  Infection prevention  Safety plan  Fluid monitoring (strict I & O)  Daily weight on standing scale  Patient and family have very limited healthcare related knowledge. Will need continued assessment and education to ensure safety and compliance with plan of care post-discharge.  Continuing to monitor closely.

## 2015-01-26 NOTE — Progress Notes (Signed)
[facility]                                                                              Patient Demographics  Diamond Boyd, is a 74 y.o. female, DOB - 10-18-1940, QJJ:[id number]  Admit date - 01/23/2015   Admitting Physician Cristal Ford, DO  Outpatient Primary MD for the patient is [name], MD  LOS - 2   Chief Complaint  Patient presents with  . Shortness of Breath      HPI on 01/23/2015  Diamond Boyd is a 74 y.o. female with a history of systolic CHF, lymphoma, hypothyroidism presented to the emergency department with complaints of shortness of breath. Patient was at her oncology office visit today when she was found to have atrial fibrillation with RVR. Of note, patient was recently admitted to [facility] long hospital last week for chemotherapy. She was then admitted to [facility] regional for congestive heart failure and was discharged on 01/22/2015. Per husband, who is at bedside, patient had 7 L of fluid removed during her hospital course. She currently denies any sick contacts, recent travel. She denies any palpitations. She does have shortness of breath with movement. Patient has also complained of decreased appetite. While in the emergency department, patient was noted to have atrial fibrillation on her EKG. Heart rate did rise to 124. Patient was given metoprolol. [facility] was called for admission.  Assessment & Plan  New onset atrial fibrillation -Possibly secondary to dehydration. Patient was recently admitted for CHF and had 7 L of fluid removed. -CHADSVASC 5 (based on Gender, age, CHF, HTN) -Patient on heparin drip, will likely need to be transitioned to Eliquis -Discussed NOAC with pharmacy and Dr. Alvy Bimler, will start patient on Eliquis '5mg'$  BID -Cardiology consulted and appreciated- ordered FOBT which was neg -Gastroenterology cleared patient for anticoagulation  -TSH 7.24, FT4 0.79 (WNL) -Continue to replace potassium -Patient will need follow-up with  cardiology, possible cardioversion in one month if she does not convert to sinus rhythm -Patient started on Lopressor 50 mg twice a day  Acute kidney injury -Baseline creatinine of 0.6. Cr was 1.44 upon admission. -Cr 1.12 today -Likely secondary to diuresis -Renal US normal  Elevated troponin nonobstructive coronary artery disease/ -Upon admission, 0.20- trending downward 0.05  -Trending downward from previous admission at [facility]. -Likely secondary to demand ischemia from her CHF exacerbation in her last admission versus atrial fibrillation with RVR -lexiscan myoview was intermediate risk, medial size infarct in mid anterior septal, apical anterior, septal walls -Patient had cardiac catheterization on 01/25/2015: Proximal RCA lesion 46%, proximal LAD to mid LAD lesion 30%, ostial LM lesion 25% -Recommendations to continue medical management  Chronic systolic heart failure -Echocardiogram 01/21/2015 showed an EF of 40-45% -Will hold Lasix for the time being, restart upon discharge -Monitor intake and output, daily weights  History of colon cancer/lymphoma -Dr. Alvy Bimler made aware of patient's admission- spoke with her regarding WBC -She recently received Neulasta today. She had chemotherapy Thursday, Friday, Saturday this past week.  Leukocytosis -Resolved Likely reactive, patient received Neulasta 01/23/2015 -Chest x-ray shows no infection, small pleural effusions resolved -Patient denies any urinary complaints.  Hypokalemia -Continue to replace and monitor BMP within  1 week -Magnesium 2  Hypothyroidism -Continue Synthroid.  -TSH 7.240, FT4 0.79  Depression -Continue Prozac  Hyperlipidemia -Continue statin  GERD -Continue PPI Code Status: Full  Family Communication: None at bedside  Disposition Plan: Admitted. Possible discharge 12/17  Time Spent in minutes   30 minutes  Procedures  Lexiscan Left heart catheterization and coronary  angiography  Consults  Cardiology Oncology Gastroenterology  DVT Prophylaxis  Heparin  Lab Results  Component Value Date   PLT 183 01/26/2015    Medications  Scheduled Meds: . acyclovir  400 mg Oral Daily  . apixaban  5 mg Oral BID  . aspirin EC  81 mg Oral Daily  . calcium-vitamin D  1 tablet Oral q1800  . cholecalciferol  1,000 Units Oral Daily  . docusate sodium  100 mg Oral QHS  . estradiol  1 mg Oral Daily  . FLUoxetine  20 mg Oral Daily  . fluticasone  2 spray Each Nare Daily  . gabapentin  300 mg Oral BID  . levothyroxine  112 mcg Oral QAC breakfast  . loratadine  10 mg Oral Daily  . metoprolol tartrate  50 mg Oral BID  . omega-3 acid ethyl esters  1 g Oral Daily  . pantoprazole  40 mg Oral Daily  . polyethylene glycol  17 g Oral QHS  . potassium chloride SA  40 mEq Oral Daily  . psyllium  1 packet Oral QHS  . simvastatin  40 mg Oral QHS  . sodium chloride  500 mL Intravenous Once  . sodium chloride  10-40 mL Intracatheter Q12H  . vitamin E  400 Units Oral Daily   Continuous Infusions:   PRN Meds:.sodium chloride, acetaminophen **OR** acetaminophen, diphenhydrAMINE, ondansetron **OR** ondansetron (ZOFRAN) IV, prochlorperazine, sodium chloride  Antibiotics   Anti-infectives    Start     Dose/Rate Route Frequency Ordered Stop   01/23/15 1930  acyclovir (ZOVIRAX) tablet 400 mg     400 mg Oral Daily 01/23/15 1820        Subjective:   Robbin Loughmiller seen and examined today.  Patient states she is feeling better today.  Would like to go home. Denies chest pain, palpitations, abdominal pain, dizziness or headache.  Objective:   Filed Vitals:   01/25/15 1702 01/25/15 1750 01/25/15 2100 01/26/15 0500  BP: 93/67 137/77 129/61 138/64  Pulse: 102 115 102 103  Temp:   100.1 F (37.8 C) 98.7 F (37.1 C)  TempSrc:   Oral Oral  Resp: '26 19 20 18  '$ Height:      Weight:    105.96 kg (233 lb 9.6 oz)  SpO2: 98% 98% 99% 97%    Wt Readings from Last 3  Encounters:  01/26/15 105.96 kg (233 lb 9.6 oz)  01/23/15 105.87 kg (233 lb 6.4 oz)  01/21/15 106.595 kg (235 lb)     Intake/Output Summary (Last 24 hours) at 01/26/15 1334 Last data filed at 01/26/15 0900  Gross per 24 hour  Intake   1296 ml  Output   1900 ml  Net   -604 ml    Exam  General: Well developed, well nourished, NAD  HEENT: NCAT, mucous membranes moist.   Cardiovascular: S1 S2 auscultated, irregular, 1/6 SEM  Respiratory: Clear to auscultation bilaterally   Abdomen: Soft, nontender, nondistended, + bowel sounds  Extremities: warm dry without cyanosis clubbing or edema  Neuro: AAOx3, nonfocal  Psych: Normal affect and demeanor, pleasant  Data Review   Micro Results Recent Results (from the past  240 hour(s))  Culture, blood (routine x 2)     Status: None   Collection Time: 01/21/15  3:10 AM  Result Value Ref Range Status   Specimen Description BLOOD RIGHT HAND  Final   Special Requests BOTTLES DRAWN AEROBIC AND ANAEROBIC 4CC  Final   Culture NO GROWTH 5 DAYS  Final   Report Status 01/26/2015 FINAL  Final  Culture, blood (routine x 2)     Status: None   Collection Time: 01/21/15  3:11 AM  Result Value Ref Range Status   Specimen Description BLOOD LEFT ASSIST CONTROL  Final   Special Requests BOTTLES DRAWN AEROBIC AND ANAEROBIC 4CC  Final   Culture NO GROWTH 5 DAYS  Final   Report Status 01/26/2015 FINAL  Final  MRSA PCR Screening     Status: None   Collection Time: 01/21/15  8:46 AM  Result Value Ref Range Status   MRSA by PCR NEGATIVE NEGATIVE Final    Comment:        The GeneXpert MRSA Assay (FDA approved for NASAL specimens only), is one component of a comprehensive MRSA colonization surveillance program. It is not intended to diagnose MRSA infection nor to guide or monitor treatment for MRSA infections.     Radiology Reports Dg Chest 2 View  01/23/2015  CLINICAL DATA:  Shortness of breath for 1 week. History of colon carcinoma and  lymphoma EXAM: CHEST  2 VIEW COMPARISON:  January 22, 2015. FINDINGS: Lungs are now clear. Heart is borderline prominent with pulmonary vascularity within normal limits. Port-A-Cath tip is in the superior vena cava. No adenopathy. No blastic or lytic bone lesions. There is degenerative change in the thoracic spine and each shoulder. IMPRESSION: Lungs are now clear. Small pleural effusions have resolved. No new opacity. Heart borderline prominent but stable. Electronically Signed   By: Lowella Grip III M.D.   On: 01/23/2015 13:26   Dg Chest 2 View  01/22/2015  CLINICAL DATA:  Recent discharge from the hospital having completed chemotherapy for Hodgkin's lymphoma, new onset of shortness of breath, chest tightness, and burning sensation. EXAM: CHEST  2 VIEW COMPARISON:  CT scan of the chest of January 21, 2015 and chest x-ray of the same day FINDINGS: The lungs are adequately inflated. There are small bilateral pleural effusions layering inferiorly. There is atelectasis or developing infiltrate at the left lung base. There is no alveolar infiltrate or pneumothorax. The heart is mildly enlarged. The pulmonary vascularity is normal. The power port appliance tip projects over the midportion of the SVC. There is mild multilevel degenerative disc disease of the thoracic spine with calcification of portions of the anterior longitudinal ligament. IMPRESSION: Small bilateral pleural effusions. Left lower lobe atelectasis or pneumonia. There no pulmonary vascular congestion. Electronically Signed   By: David  Martinique M.D.   On: 01/22/2015 08:49   Ct Angio Chest Pe W/cm &/or Wo Cm  01/21/2015  CLINICAL DATA:  Acute onset of respiratory distress. Tachypnea. Initial encounter. EXAM: CT ANGIOGRAPHY CHEST WITH CONTRAST TECHNIQUE: Multidetector CT imaging of the chest was performed using the standard protocol during bolus administration of intravenous contrast. Multiplanar CT image reconstructions and MIPs were obtained  to evaluate the vascular anatomy. CONTRAST:  185m OMNIPAQUE IOHEXOL 350 MG/ML SOLN COMPARISON:  Chest radiograph performed earlier today at 3:03 a.m., and PET/CT performed 12/13/2014 FINDINGS: There is no evidence of pulmonary embolus. Small bilateral pleural effusions are noted. Interstitial prominence is noted, raising concern for mild interstitial edema. Mild bibasilar atelectasis is seen.  There is no evidence of pneumothorax. No masses are identified; no abnormal focal contrast enhancement is seen. Diffuse coronary artery calcifications are seen. Vague nonspecific soft tissue inflammation is noted about the distal trachea and azygoesophageal region. No pericardial effusion is identified. No mediastinal lymphadenopathy is seen. The great vessels are grossly unremarkable in appearance. No axillary lymphadenopathy is seen. The visualized portions of the thyroid gland are unremarkable in appearance. A right-sided chest port is noted ending about the distal SVC. The visualized portions of the liver and spleen are unremarkable. No acute osseous abnormalities are seen. There is mild chronic osseous fusion at T6-T7. Review of the MIP images confirms the above findings. IMPRESSION: 1. No evidence of pulmonary embolus. 2. Small bilateral pleural effusions noted. Interstitial prominence, raising concern for mild interstitial edema. Mild bibasilar atelectasis seen. 3. Diffuse coronary artery calcification noted. 4. Vague nonspecific soft tissue inflammation about the distal trachea and azygoesophageal region. Electronically Signed   By: Garald Balding M.D.   On: 01/21/2015 06:58   US Renal  01/25/2015  CLINICAL DATA:  Acute renal failure. EXAM: RENAL / URINARY TRACT ULTRASOUND COMPLETE COMPARISON:  PET-CT 12/13/2014. FINDINGS: Right Kidney: Length: 13.9 cm. Echogenicity within normal limits. No mass or hydronephrosis visualized. Left Kidney: Length: 14.3 cm. Echogenicity within normal limits. No mass or hydronephrosis  visualized. Bladder: Appears normal for degree of bladder distention. IMPRESSION: Normal exam. Electronically Signed   By: Marcello Moores  Register   On: 01/25/2015 09:27   Nm Myocar Multi W/spect W/wall Motion / Ef  01/24/2015   There was no ST segment deviation noted during stress.  No T wave inversion was noted during stress.  Defect 1: There is a medium defect of mild severity present in the mid anterior, mid anteroseptal, apical anterior and apical septal location.  Findings consistent with prior myocardial infarction with peri-infarct ischemia.  This is an intermediate risk study.  The left ventricular ejection fraction is moderately decreased (30-44%).  There is a medium size infarct in the mid anteroseptal, apical anterior, septal walls with minimal peri-infarct ischemia in the mid anterior wall (SDS = 2).   Dg Chest Port 1 View  01/21/2015  CLINICAL DATA:  Acute onset of respiratory distress. Initial encounter. EXAM: PORTABLE CHEST 1 VIEW COMPARISON:  Chest radiograph performed 12/30/2014 FINDINGS: The lungs are well-aerated. Vascular congestion is noted. Bibasilar airspace opacities raise concern for mild pulmonary edema. Small bilateral pleural effusions are suspected. No pneumothorax is seen. There is no evidence of pneumothorax. The cardiomediastinal silhouette is mildly enlarged. A right-sided chest port is noted ending about the mid SVC. No acute osseous abnormalities are seen. IMPRESSION: Vascular congestion and mild cardiomegaly. Bibasilar airspace opacities raise concern for mild pulmonary edema. Small bilateral pleural effusions suspected. Electronically Signed   By: Garald Balding M.D.   On: 01/21/2015 03:22   Dg Chest Port 1 View  12/30/2014  CLINICAL DATA:  Shortness of breath. EXAM: PORTABLE CHEST 1 VIEW COMPARISON:  Chest radiograph 11/01/2014.  PET-CT 12/13/2014 FINDINGS: Tip of the right chest port in the SVC. Mild cardiomegaly is unchanged. Development of mild pulmonary edema.  Question left pleural effusion. Ill-defined linear opacities at the lung bases, likely atelectasis. No pneumothorax. No confluent airspace disease. IMPRESSION: 1. Development of pulmonary edema.  Suspect left pleural effusion. 2. Stable mild cardiomegaly. Electronically Signed   By: Jeb Levering M.D.   On: 12/30/2014 05:57    CBC  Recent Labs Lab 01/21/15 0310 01/22/15 0410 01/23/15 1602 01/24/15 0855 01/25/15 0442 01/26/15 0444  WBC 8.7 8.0 17.2* 41.9* 17.1* 4.9  HGB 11.8* 10.3* 10.0* 9.4* 8.3* 8.0*  HCT 35.2 30.4* 29.5* 28.0* 24.9* 23.5*  PLT 601* 500* 476* 354 257 183  MCV 95.2 94.0 90.8 92.4 92.6 92.9  MCH 31.8 31.7 30.8 31.0 30.9 31.6  MCHC 33.4 33.7 33.9 33.6 33.3 34.0  RDW 14.9* 14.9* 16.0* 16.2* 16.2* 16.0*  LYMPHSABS 2.6  --   --  0.8  --   --   MONOABS 0.5  --   --  0.0*  --   --   EOSABS 0.0  --   --  0.0  --   --   BASOSABS 0.0  --   --  0.0  --   --     Chemistries   Recent Labs Lab 01/21/15 0310 01/22/15 0410 01/23/15 1602 01/23/15 2100 01/24/15 0855 01/25/15 0442 01/26/15 0444  NA 139 138 138  --  139 139 137  K 3.9 3.3* 3.3*  --  3.3* 3.3* 3.2*  CL 110 106 106  --  110 110 110  CO2 22 27 20*  --  24 22 20*  GLUCOSE 136* 98 116*  --  110* 107* 95  BUN 13 23* 21*  --  '19 16 13  '$ CREATININE 0.67 0.90 1.44*  --  1.39* 1.38* 1.12*  CALCIUM 9.0 9.0 10.2  --  9.3 9.1 8.8*  MG  --   --   --  2.0  --   --   --   AST 73*  --   --   --  32  --   --   ALT 72*  --   --   --  38  --   --   ALKPHOS 169*  --   --   --  160*  --   --   BILITOT 0.7  --   --   --  1.0  --   --    ------------------------------------------------------------------------------------------------------------------ estimated creatinine clearance is 53.4 mL/min (by C-G formula based on Cr of 1.12). ------------------------------------------------------------------------------------------------------------------ No results for input(s): HGBA1C in the last 72  hours. ------------------------------------------------------------------------------------------------------------------  Recent Labs  01/25/15 0442  CHOL 163  HDL 44  LDLCALC 95  TRIG 121  CHOLHDL 3.7   ------------------------------------------------------------------------------------------------------------------  Recent Labs  01/23/15 2100  TSH 7.240*   ------------------------------------------------------------------------------------------------------------------ No results for input(s): VITAMINB12, FOLATE, FERRITIN, TIBC, IRON, RETICCTPCT in the last 72 hours.  Coagulation profile  Recent Labs Lab 01/25/15 0442 01/26/15 0657  INR 1.07 1.06    No results for input(s): DDIMER in the last 72 hours.  Cardiac Enzymes  Recent Labs Lab 01/23/15 2100 01/24/15 0301 01/24/15 0855  TROPONINI 0.06* 0.05* 0.05*   ------------------------------------------------------------------------------------------------------------------ Invalid input(s): POCBNP    Ismahan Lippman D.O. on 01/26/2015 at 1:34 PM  Between 7am to 7pm - Pager - [phone]  After 7pm go to [email or web address] - password [email or web address]  And look for the night coverage person covering for me after hours  [facility] Office  [phone]

## 2015-01-26 NOTE — Care Management Note (Addendum)
Case Management Note  Patient Details  Name: Diamond Boyd MRN: [id number] Date of Birth: Sep 28, 1940  Subjective/Objective:  Pt admitted for New onset A fib. Plan for home on Eliquis. Pt was previously active with [facility]. Pt will need resumption orders once stable for d/c along with F2F.                   Action/Plan: 30 day free card to be provided to pt. Pt will need a Rx for 30 day free no refills and the original Rx for 90 day to be sent to mail Order [facility]. Pt uses [facility] in [facility] and medication is available for 30 day free.  No further needs from CM at this time.    Expected Discharge Date:                  Expected Discharge Plan:  [place]  In-House Referral:  NA  Discharge planning Services  CM Consult  Post Acute Care Choice:  Home Health, Resumption of Svcs/PTA Provider Choice offered to:  Patient  DME Arranged:  N/A DME Agency:  NA  HH Arranged:  RN, PT [place] Agency:  [place]  Status of Service:  Completed, signed off  Medicare Important Message Given:    Date Medicare IM Given:    Medicare IM give by:    Date Additional Medicare IM Given:    Additional Medicare Important Message give by:     If discussed at [place] of Stay Meetings, dates discussed:    Additional Comments:  Bethena Roys, RN 01/26/2015, 4:49 PM

## 2015-01-26 NOTE — Plan of Care (Signed)
Problem: Activity: Goal: Risk for activity intolerance will decrease Outcome: Adequate for Discharge Pt ambulating without assist. No elevation in HR or resp status

## 2015-01-26 NOTE — Progress Notes (Signed)
ANTICOAGULATION CONSULT NOTE  Pharmacy Consult for apixaban Indication: atrial fibrillation  Patient Measurements: Heparin Dosing Weight: 88 kg  Vital Signs: Temp: 98.7 F (37.1 C) (12/16 0500) Temp Source: Oral (12/16 0500) BP: 138/64 mmHg (12/16 0500) Pulse Rate: 103 (12/16 0500)  Labs:  Recent Labs  01/23/15 2100  01/24/15 0301 01/24/15 0855 01/24/15 1135 01/25/15 0359 01/25/15 0442 01/26/15 0444 01/26/15 0657  HGB  --   --   --  9.4*  --   --  8.3* 8.0*  --   HCT  --   --   --  28.0*  --   --  24.9* 23.5*  --   PLT  --   --   --  354  --   --  257 183  --   LABPROT  --   --   --   --   --   --  14.1  --  14.0  INR  --   --   --   --   --   --  1.07  --  1.06  HEPARINUNFRC  --   < > 0.41  --  0.52 0.44  --   --  0.64  CREATININE  --   --   --  1.39*  --   --  1.38* 1.12*  --   TROPONINI 0.06*  --  0.05* 0.05*  --   --   --   --   --   < > = values in this interval not displayed.  Assessment: 1 yoF admitted from oncologist office to [facility] with afib with RVR. Pt on therapeutic heparin for afib. Plan is to use apixaban instead since the pt has lymphoma.   Goal of Therapy:  Heparin level 0.3-0.7 units/ml Monitor platelets by anticoagulation protocol: Yes   Plan:   Dc heparin Apixaban '5mg'$  PO BID Monitor CBC  Onnie Boer, PharmD Pager: [phone] 01/26/2015 9:19 AM

## 2015-01-27 DIAGNOSIS — R7989 Other specified abnormal findings of blood chemistry: Secondary | ICD-10-CM

## 2015-01-27 DIAGNOSIS — N179 Acute kidney failure, unspecified: Secondary | ICD-10-CM

## 2015-01-27 LAB — CBC
HEMATOCRIT: 24 % — AB (ref 36.0–46.0)
Hemoglobin: 8 g/dL — ABNORMAL LOW (ref 12.0–15.0)
MCH: 30.8 pg (ref 26.0–34.0)
MCHC: 33.3 g/dL (ref 30.0–36.0)
MCV: 92.3 fL (ref 78.0–100.0)
PLATELETS: 116 10*3/uL — AB (ref 150–400)
RBC: 2.6 MIL/uL — ABNORMAL LOW (ref 3.87–5.11)
RDW: 15.8 % — AB (ref 11.5–15.5)
WBC: 1.4 10*3/uL — CL (ref 4.0–10.5)

## 2015-01-27 LAB — BASIC METABOLIC PANEL
ANION GAP: 5 (ref 5–15)
BUN: 11 mg/dL (ref 6–20)
CALCIUM: 9.3 mg/dL (ref 8.9–10.3)
CO2: 23 mmol/L (ref 22–32)
Chloride: 109 mmol/L (ref 101–111)
Creatinine, Ser: 1.01 mg/dL — ABNORMAL HIGH (ref 0.44–1.00)
GFR calc Af Amer: >60 mL/min (ref >60)
GFR, EST NON AFRICAN AMERICAN: 53 mL/min — AB (ref >60)
GLUCOSE: 90 mg/dL (ref 65–99)
Potassium: 3.7 mmol/L (ref 3.5–5.1)
Sodium: 137 mmol/L (ref 135–145)

## 2015-01-27 MED ORDER — APIXABAN 5 MG PO TABS: 5.0000 mg | ORAL_TABLET | Freq: Two times a day (BID) | ORAL | Status: DC

## 2015-01-27 MED ORDER — HEPARIN SOD (PORK) LOCK FLUSH 100 UNIT/ML IV SOLN
500.0000 [IU] | INTRAVENOUS | Status: AC | PRN
Start: 2015-01-27 — End: 2015-01-27
  Administered 2015-01-27: 500 [IU]

## 2015-01-27 MED ORDER — TBO-FILGRASTIM 300 MCG/0.5ML ~~LOC~~ SOSY
300.0000 ug | PREFILLED_SYRINGE | Freq: Once | SUBCUTANEOUS | Status: AC
  Administered 2015-01-27: 300 ug via SUBCUTANEOUS
  Filled 2015-01-27: qty 0.5

## 2015-01-27 MED ORDER — METOPROLOL TARTRATE 50 MG PO TABS: 50.0000 mg | ORAL_TABLET | Freq: Two times a day (BID) | ORAL | Status: DC

## 2015-01-27 NOTE — Plan of Care (Signed)
Problem: Phase II Progression Outcomes Goal: Barriers To Progression Addressed/Resolved Outcome: Progressing Patient states she is "feeling better," and is anxious to discharge from the hospital. Remains in atrial fibrillation with controlled rates 70 - 85 on metoprolol 50 mg PO BID. Started eliquis last PM with no observed complications overnight; IV heparin discontinued yesterday. Right wrist catheterization site is stable, level 0.  Vital signs WDL for patient: Filed Vitals:    01/26/15 0500 01/26/15 1358 01/26/15 2041 01/27/15 0425  BP: 138/64 142/87 110/58 138/65  Pulse: 103 104 100 86  Temp: 98.7 F (37.1 C) 97.7 F (36.5 C) 100 F (37.8 C) 98.3 F (36.8 C)  TempSrc: Oral Oral Oral Oral  Resp: '18 16 16 16  '$ Height:          Weight: 105.96 kg (233 lb 9.6 oz)        SpO2: 97% 99% 100% 98%   Standing weight has not yet been collected; will need to address on next patient round.  Patient educated at bedside earlier this evening re:  Plan of care  Labs/tests/procedures (none currently ordered; reviewed current results with patient)  Medications  Eliquis  Metoprolol tartrate  Safety plan (low fall risk at this point, standby assist only)  Discharge plan (likely discharge today)  Patient had questions about "other tests" that she was convinced had been ordered for her. I was unable to glean from conversation with her what those "other tests" might be (possibly cardioversion in several weeks?). Did not discuss with her further, since there was not enough information.  Patient does have memory deficits that are a barrier for education. She has a relatively good concept of her plan of care, likely related to the frequency of staff interactions regarding this.  Plan is for possible discharge today with metoprolol for rate control related to atrial arrhythmia, eliquis for stroke risk modification and possible DCCV in several weeks if rhythm persists. Patient will need  focused, clear and concise discharge instructions including family to ensure understanding of health maintenance principles, safety and compliance with therapy post-discharge.

## 2015-01-27 NOTE — Discharge Instructions (Signed)
Atrial Fibrillation °Atrial fibrillation is a type of heartbeat that is irregular or fast (rapid). If you have this condition, your heart keeps quivering in a weird (chaotic) way. This condition can make it so your heart cannot pump blood normally. Having this condition gives a person more risk for stroke, heart failure, and other heart problems. There are different types of atrial fibrillation. Talk with your doctor to learn about the type that you have. °HOME CARE °· Take over-the-counter and prescription medicines only as told by your doctor. °· If your doctor prescribed a blood-thinning medicine, take it exactly as told. Taking too much of it can cause bleeding. If you do not take enough of it, you will not have the protection that you need against stroke and other problems. °· Do not use any tobacco products. These include cigarettes, chewing tobacco, and e-cigarettes. If you need help quitting, ask your doctor. °· If you have apnea (obstructive sleep apnea), manage it as told by your doctor. °· Do not drink alcohol. °· Do not drink beverages that have caffeine. These include coffee, soda, and tea. °· Maintain a healthy weight. Do not use diet pills unless your doctor says they are safe for you. Diet pills may make heart problems worse. °· Follow diet instructions as told by your doctor. °· Exercise regularly as told by your doctor. °· Keep all follow-up visits as told by your doctor. This is important. °GET HELP IF: °· You notice a change in the speed, rhythm, or strength of your heartbeat. °· You are taking a blood-thinning medicine and you notice more bruising. °· You get tired more easily when you move or exercise. °GET HELP RIGHT AWAY IF: °· You have pain in your chest or your belly (abdomen). °· You have sweating or weakness. °· You feel sick to your stomach (nauseous). °· You notice blood in your throw up (vomit), poop (stool), or pee (urine). °· You are short of breath. °· You suddenly have swollen feet  and ankles. °· You feel dizzy. °· Your suddenly get weak or numb in your face, arms, or legs, especially if it happens on one side of your body. °· You have trouble talking, trouble understanding, or both. °· Your face or your eyelid droops on one side. °These symptoms may be an emergency. Do not wait to see if the symptoms will go away. Get medical help right away. Call your local emergency services (911 in the [place] Do not drive yourself to the hospital. °  °This information is not intended to replace advice given to you by your health care provider. Make sure you discuss any questions you have with your health care provider. °  °Document Released: 11/06/2007 Document Revised: 10/18/2014 Document Reviewed: 05/24/2014 °Elsevier Interactive Patient Education ©2016 [employer] ° °

## 2015-01-27 NOTE — Progress Notes (Signed)
Patient Name: Diamond Boyd Date of Encounter: 01/27/2015   SUBJECTIVE  Feeling well. No chest pain, sob or palpitations.   CURRENT MEDS . acyclovir  400 mg Oral Daily  . apixaban  5 mg Oral BID  . aspirin EC  81 mg Oral Daily  . calcium-vitamin D  1 tablet Oral q1800  . cholecalciferol  1,000 Units Oral Daily  . docusate sodium  100 mg Oral QHS  . estradiol  1 mg Oral Daily  . FLUoxetine  20 mg Oral Daily  . fluticasone  2 spray Each Nare Daily  . gabapentin  300 mg Oral BID  . levothyroxine  112 mcg Oral QAC breakfast  . loratadine  10 mg Oral Daily  . metoprolol tartrate  50 mg Oral BID  . omega-3 acid ethyl esters  1 g Oral Daily  . pantoprazole  40 mg Oral Daily  . polyethylene glycol  17 g Oral QHS  . potassium chloride SA  40 mEq Oral Daily  . psyllium  1 packet Oral QHS  . simvastatin  40 mg Oral QHS  . sodium chloride  500 mL Intravenous Once  . sodium chloride  10-40 mL Intracatheter Q12H  . Tbo-filgastrim (GRANIX) SQ  300 mcg Subcutaneous Once  . vitamin E  400 Units Oral Daily    OBJECTIVE  Filed Vitals:   01/26/15 1358 01/26/15 2041 01/27/15 0425 01/27/15 0659  BP: 142/87 110/58 138/65   Pulse: 104 100 86   Temp: 97.7 F (36.5 C) 100 F (37.8 C) 98.3 F (36.8 C)   TempSrc: Oral Oral Oral   Resp: '16 16 16   '$ Height:      Weight:    235 lb 1.6 oz (106.641 kg)  SpO2: 99% 100% 98%     Intake/Output Summary (Last 24 hours) at 01/27/15 1032 Last data filed at 01/27/15 0432  Gross per 24 hour  Intake    960 ml  Output   1300 ml  Net   -340 ml   Filed Weights   01/25/15 0300 01/26/15 0500 01/27/15 0659  Weight: 232 lb 1.6 oz (105.28 kg) 233 lb 9.6 oz (105.96 kg) 235 lb 1.6 oz (106.641 kg)    PHYSICAL EXAM  General: Pleasant, female in NAD. Neuro: Alert and oriented X 3. Moves all extremities spontaneously. Psych: Normal affect. HEENT:  Normal  Neck: Supple without bruits. + JVD? Difficult to assess due to girth.  Lungs:  Resp regular  and unlabored, CTA. Heart: RRR no s3, s4. 1/6 systolic murmurs. Abdomen: Soft, non-tender, non-distended, BS + x 4.  Extremities: No clubbing, cyanosis or edema. DP/PT/Radials 2+ and equal bilaterally. R radial cath site without hematoma.   Accessory Clinical Findings  CBC  Recent Labs  01/26/15 0444 01/27/15 0625  WBC 4.9 1.4*  HGB 8.0* 8.0*  HCT 23.5* 24.0*  MCV 92.9 92.3  PLT 183 409*   Basic Metabolic Panel  Recent Labs  01/26/15 0444 01/27/15 0625  NA 137 137  K 3.2* 3.7  CL 110 109  CO2 20* 23  GLUCOSE 95 90  BUN 13 11  CREATININE 1.12* 1.01*  CALCIUM 8.8* 9.3   Liver Function Tests No results for input(s): AST, ALT, ALKPHOS, BILITOT, PROT, ALBUMIN in the last 72 hours. No results for input(s): LIPASE, AMYLASE in the last 72 hours. Cardiac Enzymes No results for input(s): CKTOTAL, CKMB, CKMBINDEX, TROPONINI in the last 72 hours. Thyroid Function Tests No results for input(s): TSH, T4TOTAL, T3FREE, THYROIDAB in the  last 72 hours.  Invalid input(s): FREET3  TELE  afib rate mostly in 90s to upper 100s. At time elevates to120s   Left Heart Cath and Coronary Angiography    Conclusion     Prox RCA lesion, 40% stenosed.  Prox LAD to Mid LAD lesion, 30% stenosed.  Ost LM lesion, 25% stenosed.  1. Nonobstructive CAD   Plan: medical management.      Radiology/Studies  Dg Chest 2 View  01/23/2015  CLINICAL DATA:  Shortness of breath for 1 week. History of colon carcinoma and lymphoma EXAM: CHEST  2 VIEW COMPARISON:  January 22, 2015. FINDINGS: Lungs are now clear. Heart is borderline prominent with pulmonary vascularity within normal limits. Port-A-Cath tip is in the superior vena cava. No adenopathy. No blastic or lytic bone lesions. There is degenerative change in the thoracic spine and each shoulder. IMPRESSION: Lungs are now clear. Small pleural effusions have resolved. No new opacity. Heart borderline prominent but stable. Electronically  Signed   By: Lowella Grip III M.D.   On: 01/23/2015 13:26   Dg Chest 2 View  01/22/2015  CLINICAL DATA:  Recent discharge from the hospital having completed chemotherapy for Hodgkin's lymphoma, new onset of shortness of breath, chest tightness, and burning sensation. EXAM: CHEST  2 VIEW COMPARISON:  CT scan of the chest of January 21, 2015 and chest x-ray of the same day FINDINGS: The lungs are adequately inflated. There are small bilateral pleural effusions layering inferiorly. There is atelectasis or developing infiltrate at the left lung base. There is no alveolar infiltrate or pneumothorax. The heart is mildly enlarged. The pulmonary vascularity is normal. The power port appliance tip projects over the midportion of the SVC. There is mild multilevel degenerative disc disease of the thoracic spine with calcification of portions of the anterior longitudinal ligament. IMPRESSION: Small bilateral pleural effusions. Left lower lobe atelectasis or pneumonia. There no pulmonary vascular congestion. Electronically Signed   By: David  Martinique M.D.   On: 01/22/2015 08:49   Ct Angio Chest Pe W/cm &/or Wo Cm  01/21/2015  CLINICAL DATA:  Acute onset of respiratory distress. Tachypnea. Initial encounter. EXAM: CT ANGIOGRAPHY CHEST WITH CONTRAST TECHNIQUE: Multidetector CT imaging of the chest was performed using the standard protocol during bolus administration of intravenous contrast. Multiplanar CT image reconstructions and MIPs were obtained to evaluate the vascular anatomy. CONTRAST:  186m OMNIPAQUE IOHEXOL 350 MG/ML SOLN COMPARISON:  Chest radiograph performed earlier today at 3:03 a.m., and PET/CT performed 12/13/2014 FINDINGS: There is no evidence of pulmonary embolus. Small bilateral pleural effusions are noted. Interstitial prominence is noted, raising concern for mild interstitial edema. Mild bibasilar atelectasis is seen. There is no evidence of pneumothorax. No masses are identified; no abnormal  focal contrast enhancement is seen. Diffuse coronary artery calcifications are seen. Vague nonspecific soft tissue inflammation is noted about the distal trachea and azygoesophageal region. No pericardial effusion is identified. No mediastinal lymphadenopathy is seen. The great vessels are grossly unremarkable in appearance. No axillary lymphadenopathy is seen. The visualized portions of the thyroid gland are unremarkable in appearance. A right-sided chest port is noted ending about the distal SVC. The visualized portions of the liver and spleen are unremarkable. No acute osseous abnormalities are seen. There is mild chronic osseous fusion at T6-T7. Review of the MIP images confirms the above findings. IMPRESSION: 1. No evidence of pulmonary embolus. 2. Small bilateral pleural effusions noted. Interstitial prominence, raising concern for mild interstitial edema. Mild bibasilar atelectasis seen. 3. Diffuse coronary  artery calcification noted. 4. Vague nonspecific soft tissue inflammation about the distal trachea and azygoesophageal region. Electronically Signed   By: Garald Balding M.D.   On: 01/21/2015 06:58   US Renal  01/25/2015  CLINICAL DATA:  Acute renal failure. EXAM: RENAL / URINARY TRACT ULTRASOUND COMPLETE COMPARISON:  PET-CT 12/13/2014. FINDINGS: Right Kidney: Length: 13.9 cm. Echogenicity within normal limits. No mass or hydronephrosis visualized. Left Kidney: Length: 14.3 cm. Echogenicity within normal limits. No mass or hydronephrosis visualized. Bladder: Appears normal for degree of bladder distention. IMPRESSION: Normal exam. Electronically Signed   By: Marcello Moores  Register   On: 01/25/2015 09:27   Nm Myocar Multi W/spect W/wall Motion / Ef  01/24/2015   There was no ST segment deviation noted during stress.  No T wave inversion was noted during stress.  Defect 1: There is a medium defect of mild severity present in the mid anterior, mid anteroseptal, apical anterior and apical septal location.   Findings consistent with prior myocardial infarction with peri-infarct ischemia.  This is an intermediate risk study.  The left ventricular ejection fraction is moderately decreased (30-44%).  There is a medium size infarct in the mid anteroseptal, apical anterior, septal walls with minimal peri-infarct ischemia in the mid anterior wall (SDS = 2).   Dg Chest Port 1 View  01/21/2015  CLINICAL DATA:  Acute onset of respiratory distress. Initial encounter. EXAM: PORTABLE CHEST 1 VIEW COMPARISON:  Chest radiograph performed 12/30/2014 FINDINGS: The lungs are well-aerated. Vascular congestion is noted. Bibasilar airspace opacities raise concern for mild pulmonary edema. Small bilateral pleural effusions are suspected. No pneumothorax is seen. There is no evidence of pneumothorax. The cardiomediastinal silhouette is mildly enlarged. A right-sided chest port is noted ending about the mid SVC. No acute osseous abnormalities are seen. IMPRESSION: Vascular congestion and mild cardiomegaly. Bibasilar airspace opacities raise concern for mild pulmonary edema. Small bilateral pleural effusions suspected. Electronically Signed   By: Garald Balding M.D.   On: 01/21/2015 03:22   Dg Chest Port 1 View  12/30/2014  CLINICAL DATA:  Shortness of breath. EXAM: PORTABLE CHEST 1 VIEW COMPARISON:  Chest radiograph 11/01/2014.  PET-CT 12/13/2014 FINDINGS: Tip of the right chest port in the SVC. Mild cardiomegaly is unchanged. Development of mild pulmonary edema. Question left pleural effusion. Ill-defined linear opacities at the lung bases, likely atelectasis. No pneumothorax. No confluent airspace disease. IMPRESSION: 1. Development of pulmonary edema.  Suspect left pleural effusion. 2. Stable mild cardiomegaly. Electronically Signed   By: Jeb Levering M.D.   On: 12/30/2014 05:57    ASSESSMENT AND PLAN  1. New onset a-fib ([place]) with RVR - CHADSVASCs score of at least 5 (age, sex, HTN and CHF).  - Rate mostly in 90s  to upper 100s. However elevates with exertion. Now on  metoprolol tartrate '50mg'$  BID. - Heparin to Elquis per pharmacy. Cleared by GI for anticoagulation.  - Recent echo showed EF of 45-50% with mild MR. Echo in 2015 which showed an ejection fraction at 55-60% with grade 1 diastolic  - Consider DCCV after 1 months of anticoagulation if not converted by her self.  - TSH high, Free T4 normal.   2. LV dysfunction - Myoview showed intermediate risk study.  - Cath with nonobstructive CAD. Medical management.  - Continue ASA, BB and statin  3. AKI - creatinine improved further.   4. Hypokalemia - On Kdur 98mq. Will give extra dose.   6. Hypertension -Stable  7. HL - 01/25/2015: Cholesterol 163; HDL 44;  LDL Cholesterol 95; Triglycerides 121; VLDL 24  - Continue statin  8. Hypothyroidism - Elevated TSH and normal free T4.  - Per primary  9. Anemia - patient has hx of anemia in past f/u evaluation reveled Colon Cancer s/p 2/3 colectomy 2011. The patient denies melena or blood in her stool. Will get stool gauiac prior to cath. Follow closely as she is on IV heparin.  - Hgb of 11.8 -01/21/2015; 10.3 - 01/22/2015; 10.0-12/13/2016; 9.4-12/14/2016;  8.3- 01/25/15. Today 8.0. Negative stool gauiac. Concern for anemia.   10. History of B-cell lymphoma  OK to discharge today, the patient is rate controlled, we will arrange for an outpatient follow up in 2 weeks and if still in a-fib then arrange a DCCV 4 weeks post anticoagulation initiation.   Dorothy Spark, MD 01/27/2015 10:32 AM

## 2015-01-29 ENCOUNTER — Other Ambulatory Visit: Payer: Self-pay | Admitting: Hematology and Oncology

## 2015-01-29 ENCOUNTER — Encounter: Payer: Self-pay | Admitting: Hematology and Oncology

## 2015-01-29 ENCOUNTER — Ambulatory Visit (HOSPITAL_BASED_OUTPATIENT_CLINIC_OR_DEPARTMENT_OTHER): Payer: Medicare Other | Admitting: Hematology and Oncology

## 2015-01-29 ENCOUNTER — Ambulatory Visit (HOSPITAL_COMMUNITY)
Admission: RE | Admit: 2015-01-29 | Discharge: 2015-01-29 | Disposition: A | Discharge status: Home / Self Care | Payer: Medicare Other | Source: Ambulatory Visit | Attending: Hematology and Oncology | Admitting: Hematology and Oncology

## 2015-01-29 ENCOUNTER — Telehealth: Payer: Self-pay | Admitting: *Deleted

## 2015-01-29 ENCOUNTER — Ambulatory Visit (HOSPITAL_BASED_OUTPATIENT_CLINIC_OR_DEPARTMENT_OTHER): Payer: Medicare Other

## 2015-01-29 VITALS — BP 120/50 | HR 67 | Temp 98.0°F | Resp 20

## 2015-01-29 VITALS — BP 119/48 | HR 71 | Temp 98.4°F | Resp 18 | Ht 66.0 in | Wt 235.5 lb

## 2015-01-29 DIAGNOSIS — I4891 Unspecified atrial fibrillation: Secondary | ICD-10-CM | POA: Diagnosis not present

## 2015-01-29 DIAGNOSIS — D61818 Other pancytopenia: Secondary | ICD-10-CM

## 2015-01-29 DIAGNOSIS — C8228 Follicular lymphoma grade III, unspecified, lymph nodes of multiple sites: Secondary | ICD-10-CM | POA: Diagnosis not present

## 2015-01-29 DIAGNOSIS — D630 Anemia in neoplastic disease: Secondary | ICD-10-CM

## 2015-01-29 DIAGNOSIS — D696 Thrombocytopenia, unspecified: Secondary | ICD-10-CM

## 2015-01-29 DIAGNOSIS — D702 Other drug-induced agranulocytosis: Secondary | ICD-10-CM | POA: Diagnosis not present

## 2015-01-29 LAB — CBC WITH DIFFERENTIAL/PLATELET
BASO%: 2.9 % — ABNORMAL HIGH (ref 0.0–2.0)
Basophils Absolute: 0.1 10*3/uL (ref 0.0–0.1)
EOS%: 1.9 % (ref 0.0–7.0)
Eosinophils Absolute: 0 10*3/uL (ref 0.0–0.5)
HCT: 23 % — ABNORMAL LOW (ref 34.8–46.6)
HGB: 7.8 g/dL — ABNORMAL LOW (ref 11.6–15.9)
LYMPH%: 27.2 % (ref 14.0–49.7)
MCH: 31.2 pg (ref 25.1–34.0)
MCHC: 33.9 g/dL (ref 31.5–36.0)
MCV: 92 fL (ref 79.5–101.0)
MONO#: 0.5 10*3/uL (ref 0.1–0.9)
MONO%: 26.2 % — AB (ref 0.0–14.0)
NEUT%: 41.8 % (ref 38.4–76.8)
NEUTROS ABS: 0.9 10*3/uL — AB (ref 1.5–6.5)
NRBC: 10 % — AB (ref 0–0)
Platelets: 49 10*3/uL — ABNORMAL LOW (ref 145–400)
RBC: 2.5 10*6/uL — AB (ref 3.70–5.45)
RDW: 15.7 % — AB (ref 11.2–14.5)
WBC: 2.1 10*3/uL — AB (ref 3.9–10.3)
lymph#: 0.6 10*3/uL — ABNORMAL LOW (ref 0.9–3.3)

## 2015-01-29 LAB — COMPREHENSIVE METABOLIC PANEL WITH GFR
ALT: 22 U/L (ref 0–55)
AST: 15 U/L (ref 5–34)
Albumin: 3.2 g/dL — ABNORMAL LOW (ref 3.5–5.0)
Alkaline Phosphatase: 220 U/L — ABNORMAL HIGH (ref 40–150)
Anion Gap: 6 meq/L (ref 3–11)
BUN: 13.4 mg/dL (ref 7.0–26.0)
CO2: 24 meq/L (ref 22–29)
Calcium: 9.2 mg/dL (ref 8.4–10.4)
Chloride: 108 meq/L (ref 98–109)
Creatinine: 0.9 mg/dL (ref 0.6–1.1)
EGFR: 63 ml/min/1.73 m2 — ABNORMAL LOW
Glucose: 85 mg/dL (ref 70–140)
Potassium: 3.7 meq/L (ref 3.5–5.1)
Sodium: 138 meq/L (ref 136–145)
Total Bilirubin: 0.39 mg/dL (ref 0.20–1.20)
Total Protein: 6 g/dL — ABNORMAL LOW (ref 6.4–8.3)

## 2015-01-29 LAB — HOLD TUBE, BLOOD BANK

## 2015-01-29 LAB — PREPARE RBC (CROSSMATCH)

## 2015-01-29 MED ORDER — SODIUM CHLORIDE 0.9 % IJ SOLN
10.0000 mL | INTRAMUSCULAR | Status: AC | PRN
  Administered 2015-01-29: 10 mL
  Filled 2015-01-29: qty 10

## 2015-01-29 MED ORDER — HEPARIN SOD (PORK) LOCK FLUSH 100 UNIT/ML IV SOLN
250.0000 [IU] | INTRAVENOUS | Status: DC | PRN
  Filled 2015-01-29: qty 5

## 2015-01-29 MED ORDER — FUROSEMIDE 10 MG/ML IJ SOLN
INTRAMUSCULAR | Status: AC
  Filled 2015-01-29: qty 2

## 2015-01-29 MED ORDER — APIXABAN 5 MG PO TABS: 5.0000 mg | ORAL_TABLET | Freq: Two times a day (BID) | ORAL | Status: DC

## 2015-01-29 MED ORDER — TBO-FILGRASTIM 300 MCG/0.5ML ~~LOC~~ SOSY: 300.0000 ug | PREFILLED_SYRINGE | Freq: Once | SUBCUTANEOUS | Status: DC

## 2015-01-29 MED ORDER — ACETAMINOPHEN 325 MG PO TABS
ORAL_TABLET | ORAL | Status: AC
Start: 2015-01-29 — End: 2015-01-29
  Filled 2015-01-29: qty 2

## 2015-01-29 MED ORDER — FUROSEMIDE 10 MG/ML IJ SOLN
20.0000 mg | Freq: Once | INTRAMUSCULAR | Status: AC
  Administered 2015-01-29: 20 mg via INTRAVENOUS

## 2015-01-29 MED ORDER — ACETAMINOPHEN 325 MG PO TABS
650.0000 mg | ORAL_TABLET | Freq: Once | ORAL | Status: AC
  Administered 2015-01-29: 650 mg via ORAL

## 2015-01-29 MED ORDER — DIPHENHYDRAMINE HCL 25 MG PO CAPS
ORAL_CAPSULE | ORAL | Status: AC
  Filled 2015-01-29: qty 1

## 2015-01-29 MED ORDER — HEPARIN SOD (PORK) LOCK FLUSH 100 UNIT/ML IV SOLN
500.0000 [IU] | Freq: Every day | INTRAVENOUS | Status: AC | PRN
  Administered 2015-01-29: 500 [IU]
  Filled 2015-01-29: qty 5

## 2015-01-29 MED ORDER — DIPHENHYDRAMINE HCL 25 MG PO CAPS
25.0000 mg | ORAL_CAPSULE | Freq: Once | ORAL | Status: AC
  Administered 2015-01-29: 25 mg via ORAL

## 2015-01-29 NOTE — Patient Instructions (Signed)

## 2015-01-29 NOTE — Assessment & Plan Note (Signed)
She had recent atrial fibrillation. Today, she appears to be in normal sinus rhythm and appeared to be rate control. Continue medical management. I am concerned about risk of bleeding due to progressive thrombocytopenia and plan to hold her oral anticoagulation therapy and substitute with aspirin only for the next 2 days.

## 2015-01-29 NOTE — Assessment & Plan Note (Signed)
This is likely due to recent treatment. The patient denies recent history of bleeding such as epistaxis, hematuria or hematochezia. She is symptomatic from the anemia.  We discussed some of the risks, benefits, and alternatives of blood transfusions. The patient is symptomatic from anemia and the hemoglobin level is critically low.  Some of the side-effects to be expected including risks of transfusion reactions, chills, infection, syndrome of volume overload and risk of hospitalization from various reasons and the patient is willing to proceed and went ahead to sign consent today.

## 2015-01-29 NOTE — Assessment & Plan Note (Signed)
This is likely due to recent treatment.  She has occasional nosebleed when she blows her nose and mild hematochezia after recent bowel movement. Her platelet count has dropped significantly since 2 days ago. I'm concerned about risk of bleeding and recommend holding Eliquis and switch to 81 mg aspirin for the next 2 days. I will recheck her CBC again in 3 days' time and if her platelet rebound, we will resume Eliquis

## 2015-01-29 NOTE — Assessment & Plan Note (Signed)
Clinically, she responded well to treatment with resolution of palpable lymphadenopathy. PET CT scan is scheduled for tomorrow to assess response to treatment objectively. I am not certain regarding future treatment options as the patient tolerated last cycle of treatment poorly, complicated with new onset atrial fibrillation with evidence of myocardial ischemia. We will revisit this issue next week.

## 2015-01-29 NOTE — Progress Notes (Signed)
[place] OFFICE PROGRESS NOTE  Patient Care Team: Abner Greenspan, MD as PCP - General Mosetta Anis, MD (Allergy) Fanny Skates, MD as Consulting Physician (General Surgery) Grace Isaac, MD as Consulting Physician (Cardiothoracic Surgery)  SUMMARY OF ONCOLOGIC HISTORY: Oncology History   History of colon cancer   Staging form: Colon and Rectum, AJCC 7th Edition     Clinical: Stage IIA (T3, N0, M0) - Signed by [name], MD on 02/21/2014 History of hodgkin's lymphoma   Staging form: Lymphoid Neoplasms, AJCC 6th Edition     Clinical: Stage IV - Signed by [name], MD on 02/21/2014       History of hodgkin's lymphoma   05/20/2013 Initial Diagnosis Hodgkin lymphoma   06/27/2014 Imaging Interval increase in metabolic activity of several small lymph nodes is concerning for lymphoma recurrence. Lymph nodes include a small right level 2 lymph node, right hilar lymph node, and right paratracheal lymph node   07/07/2014 Pathology Results  limited tissue from ultrasound-guided biopsy was nondiagnostic   07/07/2014 Procedure  ultrasound-guided biopsy was nondiagnostic   09/27/2014 Imaging Repeat PET CT scan show diffuse disease concern for relapse.   10/17/2014 Pathology Results Accession: [id number] Biopsy was nondiagnostic   10/17/2014 Procedure She underwent bronchoscopy & EBUS & biopsy of  LN at Level 10L   11/01/2014 Surgery She underwent cronchoscopy with endobronchial ultrasound, mediastinoscopy and biopsy    11/01/2014 Pathology Results Accession: [id number] biopsy showed no evidence of lymphoma, only granulomas.   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 ([place])   12/13/2014 Imaging PET scan showed diffuse disease   12/28/2014 - 12/30/2014 Hospital Admission She was admitted to the hospital for cycle one of R-ICE   01/18/2015 - 01/20/2015 Chemotherapy She was admitted to the hospital for cycle 2 of R-ICE   01/20/2015 - 01/22/2015 Hospital Admission She was then admitted to [facility] with respiratory failure/fluid overload and was noted to have mild cardiomyopathy   01/23/2015 - 01/27/2015 Hospital Admission she was admitted to the hospital with new onset atrial fibrillation with rapid ventricular response and was found to have evidence of myocardial ischemia. She was discharged on medical management and oral anticoagulation therapy   01/25/2015 Procedure Left heart cath showed non-obstructive lesions: Prox RCA lesion, 40% stenosed.   Prox LAD to Mid LAD lesion, 30% stenosed. Ost LM lesion, 25% stenosed    INTERVAL HISTORY: Please see below for problem oriented charting. She returns for further follow-up. She complained of weakness and shortness of breath on minimal exertion. She had bilateral lower extremity edema. She complained of mild nosebleed when she blow her nose and mild hematochezia after bowel movement. She denies excessive bleeding. No recent fevers or chills. Denies recent infection.  REVIEW OF SYSTEMS:   Constitutional: Denies fevers, chills or abnormal weight loss Eyes: Denies blurriness of vision Ears, nose, mouth, throat, and face: Denies mucositis or sore throat Cardiovascular: Denies palpitation, chest discomfort  Gastrointestinal:  Denies nausea, heartburn or change in bowel habits Skin: Denies abnormal  skin rashes Lymphatics: Denies new lymphadenopathy Neurological:Denies numbness, tingling or new  weaknesses Behavioral/Psych: Mood is stable, no new changes  All other systems were reviewed with the patient and are negative.  I have reviewed the past medical history, past surgical history, social history and family history with the patient and they are unchanged from previous note.  ALLERGIES:  is allergic to achromycin; allopurinol; astelin; cephalexin; codeine; meloxicam; minocycline; nabumetone; nyquil; penicillins; sulfa antibiotics; zolpidem tartrate; buspar; and ciprofloxacin.  MEDICATIONS:  Current Outpatient Prescriptions  Medication Sig Dispense Refill  . acetaminophen (TYLENOL) 650 MG CR tablet Take 650 mg by mouth every 8 (eight) hours as needed (For back pain.).    Marland Kitchen acyclovir (ZOVIRAX) 400 MG tablet Take 400 mg by mouth daily.    Marland Kitchen apixaban (ELIQUIS) 5 MG TABS tablet Take 1 tablet (5 mg total) by mouth 2 (two) times daily. 60 tablet 3  . Calcium Carbonate-Vitamin D (CALCIUM 600+D) 600-400 MG-UNIT per tablet Take 1 tablet by mouth every evening.     . celecoxib (CELEBREX) 200 MG capsule Take 200 mg by mouth 2 (two) times daily.    . Cholecalciferol (VITAMIN D-3) 1000 UNITS CAPS Take 1,000 Units by mouth daily.    Marland Kitchen docusate sodium (COLACE) 100 MG capsule Take 100 mg by mouth at bedtime.    Marland Kitchen estradiol (ESTRACE) 1 MG tablet Take 1 mg by mouth daily.    Marland Kitchen FLUoxetine (PROZAC) 20 MG capsule Take 20 mg by mouth daily.    . fluticasone (FLONASE) 50 MCG/ACT nasal spray Place 2 sprays into both nostrils daily.    . furosemide (LASIX) 20 MG tablet Take 1 tablet (20 mg total) by mouth daily. 30 tablet 0  . gabapentin (NEURONTIN) 300 MG capsule Take 300 mg by mouth 2 (two) times daily.    Marland Kitchen levothyroxine (SYNTHROID, LEVOTHROID) 112 MCG tablet Take 112 mcg by mouth daily before breakfast.    . lidocaine-prilocaine (EMLA) cream Apply 1 application topically as needed (For port-a-cath.).    Marland Kitchen loratadine (CLARITIN) 10 MG tablet Take 10 mg by mouth daily.    . metoprolol (LOPRESSOR) 50 MG  tablet Take 1 tablet (50 mg total) by mouth 2 (two) times daily. 60 tablet 0  . Omega-3 350 MG CAPS Take 1 capsule by mouth daily.    Marland Kitchen omeprazole (PRILOSEC) 40 MG capsule Take 40 mg by mouth daily.    . ondansetron (ZOFRAN) 8 MG tablet Take 8 mg by mouth every 6 (six) hours as needed for nausea or vomiting.     . polyethylene glycol (MIRALAX / GLYCOLAX) packet Take 17 g by mouth at bedtime.    . potassium chloride SA (K-DUR,KLOR-CON) 20 MEQ tablet Take 1 tablet (20 mEq total) by mouth daily. 30 tablet 0  . prochlorperazine (COMPAZINE) 10 MG tablet Take 10 mg by mouth every 6 (six) hours as needed for nausea or vomiting.     . psyllium (METAMUCIL) 58.6 % packet Take 1-3 packets by mouth at bedtime.     . simvastatin (ZOCOR) 40 MG tablet Take 40 mg by mouth at bedtime.    . vitamin E (VITAMIN E) 400 UNIT capsule Take 400 Units by mouth daily.      No current facility-administered medications for this visit.   Facility-Administered Medications Ordered in Other Visits  Medication Dose Route Frequency Provider Last Rate Last Dose  . heparin lock flush 100 unit/mL  500 Units Intracatheter Daily PRN [name], MD      . heparin lock  flush 100 unit/mL  250 Units Intracatheter PRN [name], MD      . sodium chloride 0.9 % injection 10 mL  10 mL Intracatheter PRN [name], MD        PHYSICAL EXAMINATION: ECOG PERFORMANCE STATUS: 2 - Symptomatic, <50% confined to bed  Filed Vitals:   01/29/15 1045  BP: 119/48  Pulse: 71  Temp: 98.4 F (36.9 C)  Resp: 18   Filed Weights   01/29/15 1045  Weight: 235 lb 8 oz (106.822 kg)    GENERAL:alert, no distress and comfortable SKIN: skin color is pale, texture, turgor are normal, no rashes or significant lesions EYES: normal, Conjunctiva are pale and non-injected, sclera clear OROPHARYNX:no exudate, no erythema and lips, buccal mucosa, and tongue normal  NECK: supple, thyroid normal size, non-tender, without nodularity LYMPH:  no palpable  lymphadenopathy in the cervical, axillary or inguinal LUNGS: clear to auscultation and percussion with normal breathing effort HEART: regular rate & rhythm and no murmurs with moderate lower extremity edema ABDOMEN:abdomen soft, non-tender and normal bowel sounds Musculoskeletal:no cyanosis of digits and no clubbing  NEURO: alert & oriented x 3 with fluent speech, no focal motor/sensory deficits  LABORATORY DATA:  I have reviewed the data as listed    Component Value Date/Time   NA 138 01/29/2015 1021   NA 137 01/27/2015 0625   K 3.7 01/29/2015 1021   K 3.7 01/27/2015 0625   CL 109 01/27/2015 0625   CL 105 07/14/2012 0939   CO2 24 01/29/2015 1021   CO2 23 01/27/2015 0625   GLUCOSE 85 01/29/2015 1021   GLUCOSE 90 01/27/2015 0625   GLUCOSE 103* 07/14/2012 0939   BUN 13.4 01/29/2015 1021   BUN 11 01/27/2015 0625   CREATININE 0.9 01/29/2015 1021   CREATININE 1.01* 01/27/2015 0625   CALCIUM 9.2 01/29/2015 1021   CALCIUM 9.3 01/27/2015 0625   CALCIUM 9.3 08/09/2009 0000   PROT 6.0* 01/29/2015 1021   PROT 5.3* 01/24/2015 0855   ALBUMIN 3.2* 01/29/2015 1021   ALBUMIN 3.1* 01/24/2015 0855   AST 15 01/29/2015 1021   AST 32 01/24/2015 0855   ALT 22 01/29/2015 1021   ALT 38 01/24/2015 0855   ALKPHOS 220* 01/29/2015 1021   ALKPHOS 160* 01/24/2015 0855   BILITOT 0.39 01/29/2015 1021   BILITOT 1.0 01/24/2015 0855   GFRNONAA 53* 01/27/2015 0625   GFRAA >60 01/27/2015 0625    No results found for: SPEP, UPEP  Lab Results  Component Value Date   WBC 2.1* 01/29/2015   NEUTROABS 0.9* 01/29/2015   HGB 7.8* 01/29/2015   HCT 23.0* 01/29/2015   MCV 92.0 01/29/2015   PLT 49* 01/29/2015      Chemistry      Component Value Date/Time   NA 138 01/29/2015 1021   NA 137 01/27/2015 0625   K 3.7 01/29/2015 1021   K 3.7 01/27/2015 0625   CL 109 01/27/2015 0625   CL 105 07/14/2012 0939   CO2 24 01/29/2015 1021   CO2 23 01/27/2015 0625   BUN 13.4 01/29/2015 1021   BUN 11 01/27/2015  0625   CREATININE 0.9 01/29/2015 1021   CREATININE 1.01* 01/27/2015 0625      Component Value Date/Time   CALCIUM 9.2 01/29/2015 1021   CALCIUM 9.3 01/27/2015 0625   CALCIUM 9.3 08/09/2009 0000   ALKPHOS 220* 01/29/2015 1021   ALKPHOS 160* 01/24/2015 0855   AST 15 01/29/2015 1021   AST 32 01/24/2015 0855   ALT 22 01/29/2015  1021   ALT 38 01/24/2015 0855   BILITOT 0.39 01/29/2015 1021   BILITOT 1.0 01/24/2015 0855      ASSESSMENT & PLAN:  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 PET CT scan is scheduled for tomorrow to assess response to treatment objectively. I am not certain regarding future treatment options as the patient tolerated last cycle of treatment poorly, complicated with new onset atrial fibrillation with evidence of myocardial ischemia. We will revisit this issue next week.  Drug-induced neutropenia ([place]) This is likely due to recent treatment. The patient denies recent history of fevers, cough, chills, diarrhea or dysuria. She is asymptomatic from the leukopenia. I will observe for now.  She has received recent growth factor support  Anemia in neoplastic disease This is likely due to recent treatment. The patient denies recent history of bleeding such as epistaxis, hematuria or hematochezia. She is symptomatic from the anemia.  We discussed some of the risks, benefits, and alternatives of blood transfusions. The patient is symptomatic from anemia and the hemoglobin level is critically low.  Some of the side-effects to be expected including risks of transfusion reactions, chills, infection, syndrome of volume overload and risk of hospitalization from various reasons and the patient is willing to proceed and went ahead to sign consent today.   Thrombocytopenia ([place]) This is likely due to recent treatment.  She has occasional nosebleed when she blows her  nose and mild hematochezia after recent bowel movement. Her platelet count has dropped significantly since 2 days ago. I'm concerned about risk of bleeding and recommend holding Eliquis and switch to 81 mg aspirin for the next 2 days. I will recheck her CBC again in 3 days' time and if her platelet rebound, we will resume Eliquis  New onset a-fib [facility]) She had recent atrial fibrillation. Today, she appears to be in normal sinus rhythm and appeared to be rate control. Continue medical management. I am concerned about risk of bleeding due to progressive thrombocytopenia and plan to hold her oral anticoagulation therapy and substitute with aspirin only for the next 2 days.   No orders of the defined types were placed in this encounter.   All questions were answered. The patient knows to call the clinic with any problems, questions or concerns. No barriers to learning was detected. I spent 25 minutes counseling the patient face to face. The total time spent in the appointment was 40 minutes and more than 50% was on counseling and review of test results     [facility], [place], MD 01/29/2015 2:27 PM

## 2015-01-29 NOTE — Assessment & Plan Note (Signed)
This is likely due to recent treatment. The patient denies recent history of fevers, cough, chills, diarrhea or dysuria. She is asymptomatic from the leukopenia. I will observe for now.  She has received recent growth factor support

## 2015-01-29 NOTE — Telephone Encounter (Signed)
Informed husband of appt today at 52 am for lab/ 10:30 for Dr. Alvy Bimler.  He verbalized understanding.

## 2015-01-30 ENCOUNTER — Encounter (HOSPITAL_COMMUNITY)
Admission: RE | Admit: 2015-01-30 | Discharge: 2015-01-30 | Disposition: A | Discharge status: Home / Self Care | Payer: Medicare Other | Source: Ambulatory Visit | Attending: Hematology and Oncology | Admitting: Hematology and Oncology

## 2015-01-30 ENCOUNTER — Telehealth: Payer: Self-pay | Admitting: Hematology and Oncology

## 2015-01-30 ENCOUNTER — Telehealth: Payer: Self-pay | Admitting: Cardiology

## 2015-01-30 DIAGNOSIS — C8248 Follicular lymphoma grade IIIb, lymph nodes of multiple sites: Secondary | ICD-10-CM | POA: Diagnosis not present

## 2015-01-30 DIAGNOSIS — C8590 Non-Hodgkin lymphoma, unspecified, unspecified site: Secondary | ICD-10-CM | POA: Diagnosis not present

## 2015-01-30 LAB — TYPE AND SCREEN
ABO/RH(D): A POS
Antibody Screen: NEGATIVE
DONOR AG TYPE: NEGATIVE
Unit division: 0

## 2015-01-30 LAB — GLUCOSE, CAPILLARY: GLUCOSE-CAPILLARY: 89 mg/dL (ref 65–99)

## 2015-01-30 MED ORDER — FLUDEOXYGLUCOSE F - 18 (FDG) INJECTION
11.6500 | Freq: Once | INTRAVENOUS | Status: AC | PRN
  Administered 2015-01-30: 11.65 via INTRAVENOUS

## 2015-01-30 NOTE — Telephone Encounter (Signed)
ERROR

## 2015-01-30 NOTE — Telephone Encounter (Signed)
Per 2nd 12/19 scheduled patient for lab/PRBC's. Left message for patient re appointments for 12/22 - lab @ 8 am follow by PRBC's at [facility] at 9 am. No other orders per 12/19 pof.

## 2015-01-31 ENCOUNTER — Ambulatory Visit (INDEPENDENT_AMBULATORY_CARE_PROVIDER_SITE_OTHER): Payer: Medicare Other | Admitting: Family Medicine

## 2015-01-31 ENCOUNTER — Encounter: Payer: Self-pay | Admitting: Family Medicine

## 2015-01-31 VITALS — BP 112/64 | HR 75 | Temp 99.3°F | Ht 66.0 in | Wt 236.5 lb

## 2015-01-31 DIAGNOSIS — E039 Hypothyroidism, unspecified: Secondary | ICD-10-CM

## 2015-01-31 DIAGNOSIS — D630 Anemia in neoplastic disease: Secondary | ICD-10-CM

## 2015-01-31 DIAGNOSIS — R778 Other specified abnormalities of plasma proteins: Secondary | ICD-10-CM

## 2015-01-31 DIAGNOSIS — I5041 Acute combined systolic (congestive) and diastolic (congestive) heart failure: Secondary | ICD-10-CM

## 2015-01-31 DIAGNOSIS — I10 Essential (primary) hypertension: Secondary | ICD-10-CM

## 2015-01-31 DIAGNOSIS — I4891 Unspecified atrial fibrillation: Secondary | ICD-10-CM

## 2015-01-31 DIAGNOSIS — R7989 Other specified abnormal findings of blood chemistry: Secondary | ICD-10-CM

## 2015-01-31 DIAGNOSIS — D702 Other drug-induced agranulocytosis: Secondary | ICD-10-CM

## 2015-01-31 DIAGNOSIS — N179 Acute kidney failure, unspecified: Secondary | ICD-10-CM

## 2015-01-31 NOTE — Progress Notes (Signed)
Subjective:    Patient ID: Diamond Boyd, female    DOB: 1940/07/03, 74 y.o.   MRN: [id number]  HPI Here for f/u after hospitalization   12/13-12/17 most recently for new a fib with RVR  Prior to that had hosp for chemo and then CHF (in [facility] and then [facility])  Went in to ED with sob and found to have afib with RVR-new onset Given metoprolol-controlled rate Started on eliquis 5 mg bid (cleared by gi for this)  Plan is to see cardiol 1/5 If not in NSR- will try to cardiovert in a mo Continues lopressor 50 mg bid BP: 112/64 mmHg   Pulse today is 75  Also AKI from recent diuresis (7L at prior hosp)-this corrected itself as did hypokalemia Re check on 12/19  Today thinks breathing was a bit worse while lying down once- improved now ? A little leg swelling -not pitting Not doing daily weights  Not on fluid restriction    Chemistry      Component Value Date/Time   NA 138 01/29/2015 1021   NA 137 01/27/2015 0625   K 3.7 01/29/2015 1021   K 3.7 01/27/2015 0625   CL 109 01/27/2015 0625   CL 105 07/14/2012 0939   CO2 24 01/29/2015 1021   CO2 23 01/27/2015 0625   BUN 13.4 01/29/2015 1021   BUN 11 01/27/2015 0625   CREATININE 0.9 01/29/2015 1021   CREATININE 1.01* 01/27/2015 0625      Component Value Date/Time   CALCIUM 9.2 01/29/2015 1021   CALCIUM 9.3 01/27/2015 0625   CALCIUM 9.3 08/09/2009 0000   ALKPHOS 220* 01/29/2015 1021   ALKPHOS 160* 01/24/2015 0855   AST 15 01/29/2015 1021   AST 32 01/24/2015 0855   ALT 22 01/29/2015 1021   ALT 38 01/24/2015 0855   BILITOT 0.39 01/29/2015 1021   BILITOT 1.0 01/24/2015 0855       Had inc troponin due to demand ischemia that improved  Rev cardiac cath 12/15 -showing some CAD- rec medical management  Last echo EF 40-45%  tsh was low with nl T4 Lab Results  Component Value Date   TSH 7.240* 01/23/2015     BC neg and MRSA neg  For lymphoma undergoing chemo No pneumonia on last CXR neulasta for blood count Has  neutropenia and anemia  Lab Results  Component Value Date   WBC 2.1* 01/29/2015   HGB 7.8* 01/29/2015   HCT 23.0* 01/29/2015   MCV 92.0 01/29/2015   PLT 49* 01/29/2015   Has transfusion planned for tomorrow   Low grade temp in office today of 99.3 99.9 last night  Thinks she has had low grade temp elevation for 2-3 wk and her oncol is aware  Nose has been running -clear (nl for her) Blood cx neg  Last  cxr -no infiltrate     Patient Active Problem List   Diagnosis Date Noted  . 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 Thrombocytopenia ([place]) 01/29/2015  . AKI (acute kidney injury) ([place])   . Elevated troponin   . Atrial fibrillation with rapid ventricular response ([place]) 01/23/2015  . New onset a-fib ([place]) 01/23/2015  . Hypertension 01/23/2015  . CHF (congestive heart failure) ([place]) 01/21/2015  . Fever, unspecified 01/10/2015  . Immunocompromised status associated with infection ([place]) 01/08/2015  . 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 Preventive  measure 11/14/2014  . Parotid mass 11/14/2014  . Chronic lower back pain 09/28/2014  . Mediastinal lymphadenopathy 09/27/2014  . Neuropathy due to chemotherapeutic drug ([place]) 06/19/2014  . Peripheral neuropathy due to chemotherapy ([place]) 04/03/2014  . Trochanteric bursitis of right hip 04/03/2014  . Anemia in chronic illness 02/21/2014  . Elevated liver enzymes 02/21/2014  . Urinary tract infection 11/18/2013  . Candidal intertrigo 09/23/2013  . Atrophic vaginitis 09/23/2013  . Drug-induced neutropenia ([place]) 09/01/2013  . History of colon cancer 06/17/2013  . History of hodgkin's lymphoma 05/20/2013  . Dysuria 05/20/2013  . Conjunctivitis, acute 04/27/2013  . Cold sore 04/27/2013  . Left temporal headache 03/11/2013  . Hyperglycemia 03/11/2013  . Acute maxillary sinusitis 02/09/2013  . Other  malaise and fatigue 01/04/2013  . Herpes zoster 11/22/2012  . Vaginal pain 11/22/2012  . Cystocele 09/22/2012  . Encounter for Medicare annual wellness exam 09/17/2012  . Left knee pain 09/10/2012  . Thoracic back pain 06/01/2012  . Skin lesion of face 05/17/2012  . Other screening mammogram 07/22/2011  . Routine gynecological examination 07/22/2011  . Colon cancer screening 07/22/2011  . History of anemia 05/06/2011  . 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 Hypothyroid 02/24/2011  . Varicose veins 02/24/2011  . Elevated blood pressure 11/20/2010  . Obesity 11/20/2010  . 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 Other chronic sinusitis 07/03/2009  . THROAT PAIN, CHRONIC 07/03/2009  . GERD 12/20/2008  . Anxiety state, unspecified 08/22/2008  . MENOPAUSAL SYNDROME 07/13/2007  . HYPERCHOLESTEROLEMIA, PURE 07/08/2006  . Allergic rhinitis 07/08/2006  . OVERACTIVE BLADDER 07/08/2006  . 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 Depression 06/25/2006  . PERIPHERAL VASCULAR DISEASE 06/25/2006  . OSTEOARTHRITIS 06/25/2006  . LEG EDEMA 06/25/2006  . SKIN CANCER, HX OF 06/25/2006  . COLONIC POLYPS, HX OF 06/25/2006   Past Medical History  Diagnosis Date  . Colon polyps   . Depression   . Hypothyroidism   . Osteoarthritis     hands  . Peripheral vascular disease ([place])   . Degenerative disk disease     spine in center in past   . 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 Cancer ([place])     skin cancer- basal cell on arm / colon 2011  . Lymphoma ([place])   . Colon cancer ([place])     2011, s/p surgery, in remission  . Neuropathy due to chemotherapeutic drug ([place]) 06/19/2014  . hodgkins lymphoma   . Heart murmur   . GERD (gastroesophageal reflux disease)   . Anemia     hx blood tx  . Pneumonia   . 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 Hypertension    Past Surgical History  Procedure  Laterality Date  . Cholecystectomy    . Splenectomy      lymphoma  . Breast biopsy  1996  . Plantar fascia release    . Ventral hernia repair  1998  . Tendon release      Right thumb   . Colectomy  8/11  . Vaginal hysterectomy    . Bone marrow biopsy  05/27/13  . Cataract extraction, bilateral  2016  . Port-a-cath insertion    . Colonoscopy w/ biopsies    . Dg biopsy lung    . Video bronchoscopy with endobronchial ultrasound N/A 10/17/2014    Procedure: VIDEO BRONCHOSCOPY WITH ENDOBRONCHIAL ULTRASOUND;  Surgeon: Javier Glazier, MD;  Location:  [facility];  Service: Thoracic;  Laterality: N/A;  . Video bronchoscopy with endobronchial ultrasound N/A 11/01/2014    Procedure: VIDEO BRONCHOSCOPY WITH ENDOBRONCHIAL ULTRASOUND;  Surgeon: Grace Isaac, MD;  Location: [facility];  Service: Thoracic;  Laterality: N/A;  . Mediastinoscopy N/A 11/01/2014    Procedure: MEDIASTINOSCOPY;  Surgeon: Grace Isaac, MD;  Location: [facility];  Service: Thoracic;  Laterality: N/A;  . Lymph node biopsy N/A 11/01/2014    Procedure: MEDIASTINAL LYMPH NODE BIOPSY;  Surgeon: Grace Isaac, MD;  Location: [facility];  Service: Thoracic;  Laterality: N/A;  . Cardiac catheterization N/A 01/25/2015    Procedure: Left Heart Cath and Coronary Angiography;  Surgeon: Peter M Martinique, MD;  Location: [facility];  Service: Cardiovascular;  Laterality: N/A;   Social History  Substance Use Topics  . Smoking status: Never Smoker   . Smokeless tobacco: Never Used     Comment: Second-hand exposure through father.  . Alcohol Use: No   Family History  Problem Relation Age of Onset  . Coronary artery disease Mother   . Alcohol abuse Mother   . Diabetes Mother   . Diabetes Brother   . Fibromyalgia Daughter     chronic pain   . COPD Daughter   . Colon cancer Neg Hx   . Colon polyps Neg Hx   . Stomach cancer Neg Hx   . Rectal cancer Neg Hx   . Ulcerative colitis Neg Hx   . Crohn's disease Neg Hx   . Asthma Daughter     . Rheum arthritis Brother   . Clotting disorder Brother    Allergies  Allergen Reactions  . Achromycin [Tetracycline Hcl] Other (See Comments)    Pt does not remember reaction  . Allopurinol Other (See Comments)    REACTION: Unsure of reaction happene years ago  . Astelin [Azelastine Hcl] Other (See Comments)    Reaction unknown  . Cephalexin Other (See Comments)    REACTION: unsure of reaction happened yrs ago.  . Codeine Other (See Comments)    REACTION: abd. pain  . Meloxicam Other (See Comments)    REACTION: GI symptoms  . Minocycline Other (See Comments)    Abdominal pain  . Nabumetone Other (See Comments)    REACTION: reaction not known  . Nyquil [Pseudoeph-Doxylamine-Dm-Apap] Hives  . Penicillins Other (See Comments)    Reaction unknown Has patient had a PCN reaction causing immediate rash, facial/tongue/throat swelling, SOB or lightheadedness with hypotension: unsure reaction unknown Has patient had a PCN reaction causing severe rash involving mucus membranes or skin necrosis: unsure reaction unknown Has patient had a PCN reaction that required hospitalization no Has patient had a PCN reaction occurring within the last 10 years: no If all of the above answers are "NO", then may proceed with Cephalosporin use.  . Sulfa Antibiotics Other (See Comments)    Gi side eff   . Zolpidem Tartrate Other (See Comments)    REACTION: feels too drugged  . Buspar [Buspirone Hcl] Other (See Comments)    Dizziness, and not as effective for anxiety  . Ciprofloxacin Rash   Current Outpatient Prescriptions on File Prior to Visit  Medication Sig Dispense Refill  . acetaminophen (TYLENOL) 650 MG CR tablet Take 650 mg by mouth every 8 (eight) hours as needed (For back pain.).    Marland Kitchen acyclovir (ZOVIRAX) 400 MG tablet Take 400 mg by mouth daily.    Marland Kitchen apixaban (ELIQUIS) 5 MG TABS tablet Take 1 tablet (5 mg  total) by mouth 2 (two) times daily. 60 tablet 3  . Calcium Carbonate-Vitamin D (CALCIUM  600+D) 600-400 MG-UNIT per tablet Take 1 tablet by mouth every evening.     . celecoxib (CELEBREX) 200 MG capsule Take 200 mg by mouth 2 (two) times daily.    . Cholecalciferol (VITAMIN D-3) 1000 UNITS CAPS Take 1,000 Units by mouth daily.    Marland Kitchen docusate sodium (COLACE) 100 MG capsule Take 100 mg by mouth at bedtime.    Marland Kitchen estradiol (ESTRACE) 1 MG tablet Take 1 mg by mouth daily.    Marland Kitchen FLUoxetine (PROZAC) 20 MG capsule Take 20 mg by mouth daily.    . fluticasone (FLONASE) 50 MCG/ACT nasal spray Place 2 sprays into both nostrils daily.    . furosemide (LASIX) 20 MG tablet Take 1 tablet (20 mg total) by mouth daily. 30 tablet 0  . gabapentin (NEURONTIN) 300 MG capsule Take 300 mg by mouth 2 (two) times daily.    Marland Kitchen levothyroxine (SYNTHROID, LEVOTHROID) 112 MCG tablet Take 112 mcg by mouth daily before breakfast.    . lidocaine-prilocaine (EMLA) cream Apply 1 application topically as needed (For port-a-cath.).    Marland Kitchen loratadine (CLARITIN) 10 MG tablet Take 10 mg by mouth daily.    . metoprolol (LOPRESSOR) 50 MG tablet Take 1 tablet (50 mg total) by mouth 2 (two) times daily. 60 tablet 0  . Omega-3 350 MG CAPS Take 1 capsule by mouth daily.    Marland Kitchen omeprazole (PRILOSEC) 40 MG capsule Take 40 mg by mouth daily.    . ondansetron (ZOFRAN) 8 MG tablet Take 8 mg by mouth every 6 (six) hours as needed for nausea or vomiting.     . polyethylene glycol (MIRALAX / GLYCOLAX) packet Take 17 g by mouth at bedtime.    . potassium chloride SA (K-DUR,KLOR-CON) 20 MEQ tablet Take 1 tablet (20 mEq total) by mouth daily. 30 tablet 0  . prochlorperazine (COMPAZINE) 10 MG tablet Take 10 mg by mouth every 6 (six) hours as needed for nausea or vomiting.     . psyllium (METAMUCIL) 58.6 % packet Take 1-3 packets by mouth at bedtime.     . simvastatin (ZOCOR) 40 MG tablet Take 40 mg by mouth at bedtime.    . vitamin E (VITAMIN E) 400 UNIT capsule Take 400 Units by mouth daily.      No current facility-administered medications on  file prior to visit.    Review of Systems Review of Systems  Constitutional: Negative for fever, appetite change, and unexpected weight change. pos for fatigue and generalized weakness Eyes: Negative for pain and visual disturbance.  Respiratory: Negative for cough and shortness of breath.   Cardiovascular: Negative for cp or palpitations   pos for mild sob on exertion/neg for PND or leg edema  Gastrointestinal: Negative for nausea, diarrhea and constipation.  Genitourinary: Negative for urgency and frequency. neg for dysuria or hematuria  Skin: Negative for pallor or rash   Neurological: Negative for weakness, light-headedness, numbness and headaches.  Hematological: Negative for adenopathy. Does not bruise/bleed easily.  Psychiatric/Behavioral: Negative for dysphoric mood. The patient is somewhat  nervous/anxious.         Objective:   Physical Exam  Constitutional: She appears well-developed and well-nourished. No distress.  obese and well appearing   HENT:  Head: Normocephalic and atraumatic.  Mouth/Throat: Oropharynx is clear and moist.  Eyes: EOM are normal. Pupils are equal, round, and reactive to light.  Conj pallor  Neck: Normal range  of motion. Neck supple. No JVD present. Carotid bruit is not present. No tracheal deviation present. No thyromegaly present.  Cardiovascular: Normal rate, normal heart sounds and intact distal pulses.  Exam reveals no gallop.   Mild LE varicosities   Pulmonary/Chest: Effort normal and breath sounds normal. No respiratory distress. She has no wheezes. She has no rales.  No crackles  Abdominal: Soft. Bowel sounds are normal. She exhibits no distension, no abdominal bruit and no mass. There is no tenderness. There is no rebound and no guarding.  Musculoskeletal: She exhibits no edema.  No pitting edema in extremeties  Lymphadenopathy:    She has no cervical adenopathy.  Neurological: She is alert. She has normal reflexes. No cranial nerve  deficit. She exhibits normal muscle tone. Coordination normal.  Skin: Skin is warm and dry. No rash noted. There is pallor.  Sallow complexion  Brisk cap refill   Psychiatric: She has a normal mood and affect.          Assessment & Plan:   Problem List Items Addressed This Visit      Cardiovascular and Mediastinum   CHF (congestive heart failure) (HCC)    Nl pulse ox and exam today  No edema or crackles Enc her to start tracking daily weights and watch for sob/orthopnea and pnd  Rev echo and cath Rev last cmet  Will have to use caution with blood transfusions not to fluid overload  F/u cardiol 1/5 for this and a fib      Hypertension    bp stable on current metoprolol (for rate control with a fib) Also lasix No hypotension  Will continue to follow      New onset a-fib ([place]) - Primary    Rate controlled Rev hospital records  No signs of CHF on exam today  Rev echo and also cath F/u cardiology 1/5 On eliquis for anticoag - plan is to cardiovert if needed a month out  Rate is well controlled on metoprolol now  Enc to start recording daily weights and avoid excess salt         Endocrine   Hypothyroid    Lab Results  Component Value Date   TSH 7.240* 01/23/2015   FT4 was normal  Hesitant to inc her levothyroxine currently in light of recent afib  (do not want to inc rate) Will re assess in 2 mo         Genitourinary   AKI (acute kidney injury) ([place])    This occurred after diuresis for CHF at 2nd to last hospitalization and improved after this one  Lab Results  Component Value Date   CREATININE 0.9 01/29/2015   BUN 13.4 01/29/2015   NA 138 01/29/2015   K 3.7 01/29/2015   CL 109 01/27/2015   CO2 24 01/29/2015   Will continue to watch hydration status  Pt has no urinary c/o         Other   Anemia in neoplastic disease    Hb 7.8 Transfusion planned tomorrow Fatigued as expected       Drug-induced neutropenia ([place])    On chemo and  Neulasta Watched by oncology Pt states she has had low grade temp for 2-3 wk without change       Elevated troponin    This occurred at last hosp  -due to demand ischemia from afib with RVR  Lab Results  Component Value Date   CKTOTAL 81 09/28/2009   CKMB 2.3 09/28/2009   TROPONINI  0.05* 01/24/2015   cardiac f/u planned 1/5

## 2015-01-31 NOTE — Assessment & Plan Note (Signed)
Lab Results  Component Value Date   TSH 7.240* 01/23/2015   FT4 was normal  Hesitant to inc her levothyroxine currently in light of recent afib  (do not want to inc rate) Will re assess in 2 mo

## 2015-01-31 NOTE — Assessment & Plan Note (Addendum)
Nl pulse ox and exam today  No edema or crackles Enc her to start tracking daily weights and watch for sob/orthopnea and pnd  Rev echo and cath Rev last cmet  Will have to use caution with blood transfusions not to fluid overload  F/u cardiol 1/5 for this and a fib

## 2015-01-31 NOTE — Progress Notes (Signed)
Pre visit review using our clinic review tool, if applicable. No additional management support is needed unless otherwise documented below in the visit note. 

## 2015-01-31 NOTE — Patient Instructions (Signed)
Your exam is stable today  I will hold off on changing your thyroid medicine until the afib is treated or normalizes  Follow up with me in approx 2 months for that  Continue oncology follow up  Get your transfusion tomorrow If temperature goes up - let your oncologist know  See cardiology in Jan as planned- rate is good  If you develop worse shortness of breath or leg swelling- alert them and [place]  Also start keeping a journal of daily weights - take that to the cardiologist - if weight increases more than 5 lb in several days- alert them   Continue current medicines

## 2015-01-31 NOTE — Assessment & Plan Note (Signed)
This occurred at last hosp  -due to demand ischemia from afib with RVR  Lab Results  Component Value Date   CKTOTAL 81 09/28/2009   CKMB 2.3 09/28/2009   TROPONINI 0.05* 01/24/2015   cardiac f/u planned 1/5

## 2015-01-31 NOTE — Assessment & Plan Note (Signed)
On chemo and Neulasta Watched by oncology Pt states she has had low grade temp for 2-3 wk without change

## 2015-01-31 NOTE — Assessment & Plan Note (Signed)
bp stable on current metoprolol (for rate control with a fib) Also lasix No hypotension  Will continue to follow

## 2015-01-31 NOTE — Assessment & Plan Note (Signed)
Rate controlled Rev hospital records  No signs of CHF on exam today  Rev echo and also cath F/u cardiology 1/5 On eliquis for anticoag - plan is to cardiovert if needed a month out  Rate is well controlled on metoprolol now  Enc to start recording daily weights and avoid excess salt

## 2015-01-31 NOTE — Assessment & Plan Note (Signed)
Hb 7.8 Transfusion planned tomorrow Fatigued as expected

## 2015-01-31 NOTE — Assessment & Plan Note (Signed)
This occurred after diuresis for CHF at 2nd to last hospitalization and improved after this one  Lab Results  Component Value Date   CREATININE 0.9 01/29/2015   BUN 13.4 01/29/2015   NA 138 01/29/2015   K 3.7 01/29/2015   CL 109 01/27/2015   CO2 24 01/29/2015   Will continue to watch hydration status  Pt has no urinary c/o

## 2015-02-01 ENCOUNTER — Other Ambulatory Visit (HOSPITAL_BASED_OUTPATIENT_CLINIC_OR_DEPARTMENT_OTHER): Payer: Medicare Other

## 2015-02-01 ENCOUNTER — Ambulatory Visit (HOSPITAL_COMMUNITY)
Admission: RE | Admit: 2015-02-01 | Discharge: 2015-02-01 | Disposition: A | Discharge status: Home / Self Care | Payer: Medicare Other | Source: Ambulatory Visit | Attending: Hematology and Oncology | Admitting: Hematology and Oncology

## 2015-02-01 DIAGNOSIS — C8228 Follicular lymphoma grade III, unspecified, lymph nodes of multiple sites: Secondary | ICD-10-CM

## 2015-02-01 LAB — CBC WITH DIFFERENTIAL/PLATELET
BASO%: 0.3 % (ref 0.0–2.0)
Basophils Absolute: 0.1 10*3/uL (ref 0.0–0.1)
EOS%: 0.4 % (ref 0.0–7.0)
Eosinophils Absolute: 0.1 10*3/uL (ref 0.0–0.5)
HCT: 26.3 % — ABNORMAL LOW (ref 34.8–46.6)
HEMOGLOBIN: 8.9 g/dL — AB (ref 11.6–15.9)
LYMPH#: 1 10*3/uL (ref 0.9–3.3)
LYMPH%: 4.6 % — ABNORMAL LOW (ref 14.0–49.7)
MCH: 31.6 pg (ref 25.1–34.0)
MCHC: 33.8 g/dL (ref 31.5–36.0)
MCV: 93.3 fL (ref 79.5–101.0)
MONO#: 2.2 10*3/uL — ABNORMAL HIGH (ref 0.1–0.9)
MONO%: 10.4 % (ref 0.0–14.0)
NEUT#: 18.1 10*3/uL — ABNORMAL HIGH (ref 1.5–6.5)
NEUT%: 84.3 % — ABNORMAL HIGH (ref 38.4–76.8)
NRBC: 7 % — AB (ref 0–0)
Platelets: 32 10*3/uL — ABNORMAL LOW (ref 145–400)
RBC: 2.82 10*6/uL — AB (ref 3.70–5.45)
RDW: 15.5 % — AB (ref 11.2–14.5)
WBC: 21.4 10*3/uL — AB (ref 3.9–10.3)

## 2015-02-01 LAB — COMPREHENSIVE METABOLIC PANEL
ALBUMIN: 3.2 g/dL — AB (ref 3.5–5.0)
ALT: 20 U/L (ref 0–55)
AST: 23 U/L (ref 5–34)
Alkaline Phosphatase: 289 U/L — ABNORMAL HIGH (ref 40–150)
Anion Gap: 8 mEq/L (ref 3–11)
BUN: 8.1 mg/dL (ref 7.0–26.0)
CHLORIDE: 107 meq/L (ref 98–109)
CO2: 24 meq/L (ref 22–29)
CREATININE: 0.9 mg/dL (ref 0.6–1.1)
Calcium: 8.8 mg/dL (ref 8.4–10.4)
EGFR: 65 mL/min/{1.73_m2} — ABNORMAL LOW (ref >90)
GLUCOSE: 108 mg/dL (ref 70–140)
POTASSIUM: 3.9 meq/L (ref 3.5–5.1)
SODIUM: 140 meq/L (ref 136–145)
TOTAL PROTEIN: 5.9 g/dL — AB (ref 6.4–8.3)
Total Bilirubin: 0.32 mg/dL (ref 0.20–1.20)

## 2015-02-01 LAB — HOLD TUBE, BLOOD BANK

## 2015-02-01 NOTE — Progress Notes (Signed)
[place] Dr. Alvy Bimler nurse and lab called to advise that patient didn't need blood transfusion today.  Patient advised, and verbalizes understanding.  Husband present to transport home.

## 2015-02-06 ENCOUNTER — Other Ambulatory Visit (HOSPITAL_BASED_OUTPATIENT_CLINIC_OR_DEPARTMENT_OTHER): Payer: Medicare Other

## 2015-02-06 ENCOUNTER — Encounter: Payer: Self-pay | Admitting: Hematology and Oncology

## 2015-02-06 ENCOUNTER — Telehealth: Payer: Self-pay | Admitting: *Deleted

## 2015-02-06 ENCOUNTER — Ambulatory Visit (HOSPITAL_BASED_OUTPATIENT_CLINIC_OR_DEPARTMENT_OTHER): Payer: Medicare Other | Admitting: Hematology and Oncology

## 2015-02-06 VITALS — BP 121/58 | HR 69 | Temp 98.4°F | Resp 18 | Ht 66.0 in | Wt 235.3 lb

## 2015-02-06 DIAGNOSIS — D638 Anemia in other chronic diseases classified elsewhere: Secondary | ICD-10-CM

## 2015-02-06 DIAGNOSIS — I5031 Acute diastolic (congestive) heart failure: Secondary | ICD-10-CM

## 2015-02-06 DIAGNOSIS — C8228 Follicular lymphoma grade III, unspecified, lymph nodes of multiple sites: Secondary | ICD-10-CM

## 2015-02-06 DIAGNOSIS — K625 Hemorrhage of anus and rectum: Secondary | ICD-10-CM | POA: Insufficient documentation

## 2015-02-06 LAB — CBC WITH DIFFERENTIAL/PLATELET
BASO%: 0.2 % (ref 0.0–2.0)
BASOS ABS: 0 10*3/uL (ref 0.0–0.1)
EOS%: 0.9 % (ref 0.0–7.0)
Eosinophils Absolute: 0.1 10*3/uL (ref 0.0–0.5)
HCT: 28.1 % — ABNORMAL LOW (ref 34.8–46.6)
HEMOGLOBIN: 9.4 g/dL — AB (ref 11.6–15.9)
LYMPH%: 7.4 % — AB (ref 14.0–49.7)
MCH: 31.5 pg (ref 25.1–34.0)
MCHC: 33.5 g/dL (ref 31.5–36.0)
MCV: 94.3 fL (ref 79.5–101.0)
MONO#: 1.9 10*3/uL — ABNORMAL HIGH (ref 0.1–0.9)
MONO%: 12 % (ref 0.0–14.0)
NEUT#: 12.3 10*3/uL — ABNORMAL HIGH (ref 1.5–6.5)
NEUT%: 79.5 % — AB (ref 38.4–76.8)
NRBC: 3 % — AB (ref 0–0)
Platelets: 222 10*3/uL (ref 145–400)
RBC: 2.98 10*6/uL — ABNORMAL LOW (ref 3.70–5.45)
RDW: 17.1 % — ABNORMAL HIGH (ref 11.2–14.5)
WBC: 15.5 10*3/uL — ABNORMAL HIGH (ref 3.9–10.3)
lymph#: 1.2 10*3/uL (ref 0.9–3.3)

## 2015-02-06 LAB — COMPREHENSIVE METABOLIC PANEL
ALBUMIN: 3.2 g/dL — AB (ref 3.5–5.0)
ALK PHOS: 349 U/L — AB (ref 40–150)
ALT: 20 U/L (ref 0–55)
AST: 19 U/L (ref 5–34)
Anion Gap: 7 mEq/L (ref 3–11)
BUN: 8.9 mg/dL (ref 7.0–26.0)
CO2: 28 mEq/L (ref 22–29)
CREATININE: 0.9 mg/dL (ref 0.6–1.1)
Calcium: 9.5 mg/dL (ref 8.4–10.4)
Chloride: 104 mEq/L (ref 98–109)
EGFR: 67 mL/min/{1.73_m2} — ABNORMAL LOW (ref >90)
GLUCOSE: 103 mg/dL (ref 70–140)
Potassium: 4.6 mEq/L (ref 3.5–5.1)
SODIUM: 140 meq/L (ref 136–145)
TOTAL PROTEIN: 6.2 g/dL — AB (ref 6.4–8.3)

## 2015-02-06 LAB — HOLD TUBE, BLOOD BANK

## 2015-02-06 NOTE — Progress Notes (Signed)
[place] OFFICE PROGRESS NOTE  Patient Care Team: Abner Greenspan, MD as PCP - General Mosetta Anis, MD (Allergy) Fanny Skates, MD as Consulting Physician (General Surgery) Grace Isaac, MD as Consulting Physician (Cardiothoracic Surgery)  SUMMARY OF ONCOLOGIC HISTORY: Oncology History   History of colon cancer   Staging form: Colon and Rectum, AJCC 7th Edition     Clinical: Stage IIA (T3, N0, M0) - Signed by [name], MD on 02/21/2014 History of hodgkin's lymphoma   Staging form: Lymphoid Neoplasms, AJCC 6th Edition     Clinical: Stage IV - Signed by [name], MD on 02/21/2014       History of hodgkin's lymphoma   05/20/2013 Initial Diagnosis Hodgkin lymphoma   06/27/2014 Imaging Interval increase in metabolic activity of several small lymph nodes is concerning for lymphoma recurrence. Lymph nodes include a small right level 2 lymph node, right hilar lymph node, and right paratracheal lymph node   07/07/2014 Pathology Results  limited tissue from ultrasound-guided biopsy was nondiagnostic   07/07/2014 Procedure  ultrasound-guided biopsy was nondiagnostic   09/27/2014 Imaging Repeat PET CT scan show diffuse disease concern for relapse.   10/17/2014 Pathology Results Accession: [id number] Biopsy was nondiagnostic   10/17/2014 Procedure She underwent bronchoscopy & EBUS & biopsy of  LN at Level 10L   11/01/2014 Surgery She underwent cronchoscopy with endobronchial ultrasound, mediastinoscopy and biopsy    11/01/2014 Pathology Results Accession: [id number] biopsy showed no evidence of lymphoma, only granulomas.   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 ([place])   12/13/2014 Imaging PET scan showed diffuse disease   12/28/2014 - 12/30/2014 Hospital Admission She was admitted to the hospital for cycle one of R-ICE   01/18/2015 - 01/20/2015 Chemotherapy She was admitted to the hospital for cycle 2 of R-ICE   01/20/2015 - 01/22/2015 Hospital Admission She was then admitted to [facility] with respiratory failure/fluid overload and was noted to have mild cardiomyopathy   01/23/2015 - 01/27/2015 Hospital Admission she was admitted to the hospital with new onset atrial fibrillation with rapid ventricular response and was found to have evidence of myocardial ischemia. She was discharged on medical management and oral anticoagulation therapy   01/25/2015 Procedure Left heart cath showed non-obstructive lesions: Prox RCA lesion, 40% stenosed.   Prox LAD to Mid LAD lesion, 30% stenosed. Ost LM lesion, 25% stenosed   01/30/2015 Imaging PET Ct scan showed near complete resolution of all disease    INTERVAL HISTORY: Please see below for problem oriented charting.  she returns for further follow-up. She denies recent chest pain or shortness of breath. Her leg swelling has improved. She had profuse rectal bleeding last week. Since we switched her to 81 mg aspirin, she have only minor rectal bleeding after bowel movement today.  She denies any other forms of bleeding.  REVIEW OF SYSTEMS:   Constitutional: Denies fevers, chills or abnormal weight loss Eyes: Denies blurriness of vision Ears, nose, mouth, throat, and face: Denies mucositis or sore throat Respiratory: Denies  cough, dyspnea or wheezes Cardiovascular: Denies palpitation, chest discomfort  Gastrointestinal:  Denies nausea, heartburn or change in bowel habits Skin: Denies abnormal skin  rashes Lymphatics: Denies new lymphadenopathy or easy bruising Neurological:Denies numbness, tingling or new weaknesses Behavioral/Psych: Mood is stable, no new changes  All other systems were reviewed with the patient and are negative.  I have reviewed the past medical history, past surgical history, social history and family history with the patient and they are unchanged from previous note.  ALLERGIES:  is allergic to achromycin; allopurinol; astelin; cephalexin; codeine; meloxicam; minocycline; nabumetone; nyquil; penicillins; sulfa antibiotics; zolpidem tartrate; buspar; and ciprofloxacin.  MEDICATIONS:  Current Outpatient Prescriptions  Medication Sig Dispense Refill  . acetaminophen (TYLENOL) 650 MG CR tablet Take 650 mg by mouth every 8 (eight) hours as needed (For back pain.).    Marland Kitchen acyclovir (ZOVIRAX) 400 MG tablet Take 400 mg by mouth daily.    . Calcium Carbonate-Vitamin D (CALCIUM 600+D) 600-400 MG-UNIT per tablet Take 1 tablet by mouth every evening.     . celecoxib (CELEBREX) 200 MG capsule Take 200 mg by mouth 2 (two) times daily.    . Cholecalciferol (VITAMIN D-3) 1000 UNITS CAPS Take 1,000 Units by mouth daily.    Marland Kitchen docusate sodium (COLACE) 100 MG capsule Take 100 mg by mouth at bedtime.    Marland Kitchen estradiol (ESTRACE) 1 MG tablet Take 1 mg by mouth daily.    Marland Kitchen FLUoxetine (PROZAC) 20 MG capsule Take 20 mg by mouth daily.    . fluticasone (FLONASE) 50 MCG/ACT nasal spray Place 2 sprays into both nostrils daily.    . furosemide (LASIX) 20 MG tablet Take 1 tablet (20 mg total) by mouth daily. 30 tablet 0  . gabapentin (NEURONTIN) 300 MG capsule Take 300 mg by mouth 2 (two) times daily.    Marland Kitchen levothyroxine (SYNTHROID, LEVOTHROID) 112 MCG tablet Take 112 mcg by mouth daily before breakfast.    . lidocaine-prilocaine (EMLA) cream Apply 1 application topically as needed (For port-a-cath.).    Marland Kitchen loratadine (CLARITIN) 10 MG tablet Take 10 mg by mouth daily.    . metoprolol (LOPRESSOR) 50  MG tablet Take 1 tablet (50 mg total) by mouth 2 (two) times daily. 60 tablet 0  . Omega-3 350 MG CAPS Take 1 capsule by mouth daily.    Marland Kitchen omeprazole (PRILOSEC) 40 MG capsule Take 40 mg by mouth daily.    . polyethylene glycol (MIRALAX / GLYCOLAX) packet Take 17 g by mouth at bedtime.    . potassium chloride SA (K-DUR,KLOR-CON) 20 MEQ tablet Take 1 tablet (20 mEq total) by mouth daily. 30 tablet 0  . psyllium (METAMUCIL) 58.6 % packet Take 1-3 packets by mouth at bedtime.     . simvastatin (ZOCOR) 40 MG tablet Take 40 mg by mouth at bedtime.    . vitamin E (VITAMIN E) 400 UNIT capsule Take 400 Units by mouth daily.     Marland Kitchen apixaban (ELIQUIS) 5 MG TABS tablet Take 1 tablet (5 mg total) by mouth 2 (two) times daily. (Patient not taking: Reported on 02/06/2015) 60 tablet 3  . ondansetron (ZOFRAN) 8 MG tablet Take 8 mg by mouth every 6 (six) hours as needed for nausea or vomiting. Reported on 02/06/2015    . prochlorperazine (COMPAZINE) 10 MG tablet Take 10 mg by mouth every 6 (six) hours as needed for nausea or vomiting. Reported on 02/06/2015     No current facility-administered medications for this visit.    PHYSICAL EXAMINATION: ECOG  PERFORMANCE STATUS: 1 - Symptomatic but completely ambulatory  Filed Vitals:   02/06/15 1114  BP: 121/58  Pulse: 69  Temp: 98.4 F (36.9 C)  Resp: 18   Filed Weights   02/06/15 1114  Weight: 235 lb 4.8 oz (106.731 kg)    GENERAL:alert, no distress and comfortable SKIN: skin color, texture, turgor are normal, no rashes or significant lesions EYES: normal, Conjunctiva are pink and non-injected, sclera clear OROPHARYNX:no exudate, no erythema and lips, buccal mucosa, and tongue normal  NECK: supple, thyroid normal size, non-tender, without nodularity LYMPH:  no palpable lymphadenopathy in the cervical, axillary or inguinal LUNGS: clear to auscultation and percussion with normal breathing effort HEART: regular rate & rhythm and no murmurs  With mild  bilateral lower extremity edema ABDOMEN:abdomen soft, non-tender and normal bowel sounds Musculoskeletal:no cyanosis of digits and no clubbing  NEURO: alert & oriented x 3 with fluent speech, no focal motor/sensory deficits  LABORATORY DATA:  I have reviewed the data as listed    Component Value Date/Time   NA 140 02/06/2015 1050   NA 137 01/27/2015 0625   K 4.6 02/06/2015 1050   K 3.7 01/27/2015 0625   CL 109 01/27/2015 0625   CL 105 07/14/2012 0939   CO2 28 02/06/2015 1050   CO2 23 01/27/2015 0625   GLUCOSE 103 02/06/2015 1050   GLUCOSE 90 01/27/2015 0625   GLUCOSE 103* 07/14/2012 0939   BUN 8.9 02/06/2015 1050   BUN 11 01/27/2015 0625   CREATININE 0.9 02/06/2015 1050   CREATININE 1.01* 01/27/2015 0625   CALCIUM 9.5 02/06/2015 1050   CALCIUM 9.3 01/27/2015 0625   CALCIUM 9.3 08/09/2009 0000   PROT 6.2* 02/06/2015 1050   PROT 5.3* 01/24/2015 0855   ALBUMIN 3.2* 02/06/2015 1050   ALBUMIN 3.1* 01/24/2015 0855   AST 19 02/06/2015 1050   AST 32 01/24/2015 0855   ALT 20 02/06/2015 1050   ALT 38 01/24/2015 0855   ALKPHOS 349* 02/06/2015 1050   ALKPHOS 160* 01/24/2015 0855   BILITOT <0.30 02/06/2015 1050   BILITOT 1.0 01/24/2015 0855   GFRNONAA 53* 01/27/2015 0625   GFRAA >60 01/27/2015 0625    No results found for: SPEP, UPEP  Lab Results  Component Value Date   WBC 15.5* 02/06/2015   NEUTROABS 12.3* 02/06/2015   HGB 9.4* 02/06/2015   HCT 28.1* 02/06/2015   MCV 94.3 02/06/2015   PLT 222 02/06/2015      Chemistry      Component Value Date/Time   NA 140 02/06/2015 1050   NA 137 01/27/2015 0625   K 4.6 02/06/2015 1050   K 3.7 01/27/2015 0625   CL 109 01/27/2015 0625   CL 105 07/14/2012 0939   CO2 28 02/06/2015 1050   CO2 23 01/27/2015 0625   BUN 8.9 02/06/2015 1050   BUN 11 01/27/2015 0625   CREATININE 0.9 02/06/2015 1050   CREATININE 1.01* 01/27/2015 0625      Component Value Date/Time   CALCIUM 9.5 02/06/2015 1050   CALCIUM 9.3 01/27/2015 0625    CALCIUM 9.3 08/09/2009 0000   ALKPHOS 349* 02/06/2015 1050   ALKPHOS 160* 01/24/2015 0855   AST 19 02/06/2015 1050   AST 32 01/24/2015 0855   ALT 20 02/06/2015 1050   ALT 38 01/24/2015 0855   BILITOT <0.30 02/06/2015 1050   BILITOT 1.0 01/24/2015 0855       RADIOGRAPHIC STUDIES: I reviewed the most recent PET CT scan with her and her husband I have personally  reviewed the radiological images as listed and agreed with the findings in the report.    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 PET CT scan thankfully showed complete response to treatment. I discussed with her lymphoma physician from [facility]. Due to recent cardiac events, we do not feel safe to proceed with autologous stem cell transplant.  I recommend a rest period and not pursue any additional treatment. I will bring her back to my office in one month to discuss options, either chemotherapy holiday versus maintainance Revlimid versus long-term rituximab treatment.  Anemia in chronic illness This is likely anemia of chronic disease. The patient denies recent history of bleeding such as epistaxis, hematuria or hematochezia. She is asymptomatic from the anemia. We will observe for now.  She does not require transfusion now.    Rectal bleeding  The patient has severe rectal bleeding recently. She was on Eliquis and had recent thrombocytopenia from chemotherapy. Her thrombocytopenia has resolved. She still has mild hematochezia after bowel movement this morning. Throughout last week, she is on 81 mg aspirin.  I recommend she holds I will Eliquis again today. If rectal bleeding resolves, she will resume Eliquis. If not, she will remain on aspirin only long-term to prevent stroke  CHF (congestive heart failure) ([place]) She had recent atrial fibrillation.  She is currently in sinus rhythm and appeared to be rate  control. Continue medical management. I am concerned about risk of bleeding with recent thrombocytopenia and plan to hold her oral anticoagulation therapy and substitute with aspirin until it resolves  she will be seeing cardiologist next week for further management.   No orders of the defined types were placed in this encounter.   All questions were answered. The patient knows to call the clinic with any problems, questions or concerns. No barriers to learning was detected. I spent 30 minutes counseling the patient face to face. The total time spent in the appointment was 40 minutes and more than 50% was on counseling and review of test results     [facility], [place], MD 02/06/2015 12:31 PM

## 2015-02-06 NOTE — Assessment & Plan Note (Signed)
This is likely anemia of chronic disease. The patient denies recent history of bleeding such as epistaxis, hematuria or hematochezia. She is asymptomatic from the anemia. We will observe for now.  She does not require transfusion now.   

## 2015-02-06 NOTE — Assessment & Plan Note (Signed)
She is recovering from recent episode of heart failure. PET CT scan thankfully showed complete response to treatment. I discussed with her lymphoma physician from [facility]. Due to recent cardiac events, we do not feel safe to proceed with autologous stem cell transplant.  I recommend a rest period and not pursue any additional treatment. I will bring her back to my office in one month to discuss options, either chemotherapy holiday versus maintainance Revlimid versus long-term rituximab treatment.

## 2015-02-06 NOTE — Assessment & Plan Note (Signed)
The patient has severe rectal bleeding recently. She was on Eliquis and had recent thrombocytopenia from chemotherapy. Her thrombocytopenia has resolved. She still has mild hematochezia after bowel movement this morning. Throughout last week, she is on 81 mg aspirin.  I recommend she holds I will Eliquis again today. If rectal bleeding resolves, she will resume Eliquis. If not, she will remain on aspirin only long-term to prevent stroke

## 2015-02-06 NOTE — Telephone Encounter (Signed)
-----   Message from [name], MD sent at 02/06/2015 12:21 PM EST ----- Regarding: eliquis For her, if her rectal bleeding is minimum tomorrow, she can resume Eliquis tomorrow. If rectal bleeding is still bad, then continue to hold eliquis and stay on aspirin for now

## 2015-02-06 NOTE — Telephone Encounter (Signed)
LM with note below. To call if has questions

## 2015-02-06 NOTE — Assessment & Plan Note (Signed)
She had recent atrial fibrillation.  She is currently in sinus rhythm and appeared to be rate control. Continue medical management. I am concerned about risk of bleeding with recent thrombocytopenia and plan to hold her oral anticoagulation therapy and substitute with aspirin until it resolves  she will be seeing cardiologist next week for further management.

## 2015-02-08 ENCOUNTER — Other Ambulatory Visit: Payer: Self-pay | Admitting: Hematology and Oncology

## 2015-02-08 ENCOUNTER — Encounter: Payer: Self-pay | Admitting: *Deleted

## 2015-02-09 ENCOUNTER — Ambulatory Visit: Payer: Medicare Other | Admitting: Family

## 2015-02-14 NOTE — Progress Notes (Signed)
Cardiology Office Note    Date:  02/15/2015   ID:  Diamond Boyd, DOB 1940-11-09, MRN [id number]  PCP:  [name], MD  Cardiologist:  Dr. Shelva Majestic  Electrophysiologist:  n/a  Chief Complaint  Patient presents with  . Hospitalization Follow-up    AFib, CHF    History of Present Illness:  Diamond Boyd is a 75 y.o. female with a hx of lung CA status post resection, Hodgkin's lymphoma, malignant B-cell follicular lymphoma, neuropathy, hypothyroidism. She was admitted to [facility] 12/11-12/12 with acute CHF. Echo demonstrated EF 40-45%. Troponin was elevated at 0.38. This is felt to be demand ischemia. She was seen by a cardiologist at current nodal clinic and outpatient follow-up was recommended.  Readmitted 12/13-12/17. She was found to be in AF with RVR at her oncologist office and was sent to the emergency room.  CHADS2-VASc=5.  She had minimally elevated troponin levels.  She underwent inpatient Myoview which demonstrated anterior scar with minimal peri-infarct ischemia. She therefore underwent cardiac catheterization which demonstrated mild nonobstructive CAD. She was noted to be anemic. She was cleared for anticoagulation by gastroenterology.  Creatinine was elevated during her admission but this improved prior to discharge. Plan was to proceed with cardioversion after 4 weeks of uninterrupted anticoagulation if she remained in atrial fibrillation.  She returns for follow-up. Here today with her husband. Since DC, she has had episodes of rectal bleeding. Her Eliquis was stopped. She tried to resume her Eliquis again with recurrent rectal bleeding. Therefore, she has remained off of Eliquis. She did require transfusion with PRBCs after DC from the hospital secondary to significant anemia. She is followed closely by oncology.  In early December, she was admitted for chemotherapy which included rituximab, ifosfamide, carboplatin, etoposide with mesna.  Recent testing has been stable.  She may require some maintenance chemotherapy. Overall, she is doing well. She denies significant dyspnea or edema. She denies orthopnea, PND. She denies syncope. She denies chest discomfort.   Past Medical History  Diagnosis Date  . Colon polyps   . Depression   . Hypothyroidism   . Osteoarthritis     hands  . Peripheral vascular disease ([place])   . Degenerative disk disease     spine in center in past   . 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 Cancer ([place])     skin cancer- basal cell on arm / colon 2011  . Lymphoma ([place])   . Colon cancer ([place])     2011, s/p surgery, in remission  . Neuropathy due to chemotherapeutic drug ([place]) 06/19/2014  . hodgkins lymphoma   . Heart murmur   . GERD (gastroesophageal reflux disease)   . Anemia     hx blood tx  . Pneumonia   . 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 Hypertension     Past Surgical History  Procedure Laterality Date  . Cholecystectomy    . Splenectomy      lymphoma  . Breast biopsy  1996  . Plantar fascia release    . Ventral hernia repair  1998  . Tendon release      Right thumb   . Colectomy  8/11  . Vaginal hysterectomy    . Bone marrow biopsy  05/27/13  . Cataract extraction, bilateral  2016  . Port-a-cath insertion    . Colonoscopy w/ biopsies    . Dg biopsy lung    .  Video bronchoscopy with endobronchial ultrasound N/A 10/17/2014    Procedure: VIDEO BRONCHOSCOPY WITH ENDOBRONCHIAL ULTRASOUND;  Surgeon: Javier Glazier, MD;  Location: [facility];  Service: Thoracic;  Laterality: N/A;  . Video bronchoscopy with endobronchial ultrasound N/A 11/01/2014    Procedure: VIDEO BRONCHOSCOPY WITH ENDOBRONCHIAL ULTRASOUND;  Surgeon: Grace Isaac, MD;  Location: [facility];  Service: Thoracic;  Laterality: N/A;  . Mediastinoscopy N/A 11/01/2014    Procedure: MEDIASTINOSCOPY;  Surgeon: Grace Isaac, MD;  Location: [facility];  Service:  Thoracic;  Laterality: N/A;  . Lymph node biopsy N/A 11/01/2014    Procedure: MEDIASTINAL LYMPH NODE BIOPSY;  Surgeon: Grace Isaac, MD;  Location: [facility];  Service: Thoracic;  Laterality: N/A;  . Cardiac catheterization N/A 01/25/2015    Procedure: Left Heart Cath and Coronary Angiography;  Surgeon: Peter M Martinique, MD;  Location: [facility];  Service: Cardiovascular;  Laterality: N/A;    Current Outpatient Prescriptions  Medication Sig Dispense Refill  . acetaminophen (TYLENOL) 650 MG CR tablet Take 650 mg by mouth every 8 (eight) hours as needed (For back pain.).    Marland Kitchen acyclovir (ZOVIRAX) 400 MG tablet Take 400 mg by mouth daily.    Marland Kitchen aspirin 81 MG tablet Take 81 mg by mouth daily.    . Calcium Carbonate-Vitamin D (CALCIUM 600+D) 600-400 MG-UNIT per tablet Take 1 tablet by mouth every evening.     . celecoxib (CELEBREX) 200 MG capsule Take 200 mg by mouth 2 (two) times daily.    . Cholecalciferol (VITAMIN D-3) 1000 UNITS CAPS Take 1,000 Units by mouth daily.    Marland Kitchen docusate sodium (COLACE) 100 MG capsule Take 100 mg by mouth at bedtime.    Marland Kitchen estradiol (ESTRACE) 1 MG tablet Take 1 mg by mouth daily.    Marland Kitchen FLUoxetine (PROZAC) 20 MG capsule Take 20 mg by mouth daily.    . fluticasone (FLONASE) 50 MCG/ACT nasal spray Place 2 sprays into both nostrils daily.    . furosemide (LASIX) 20 MG tablet Take 1 tablet (20 mg total) by mouth daily. 30 tablet 0  . gabapentin (NEURONTIN) 300 MG capsule Take 300 mg by mouth 2 (two) times daily.    Marland Kitchen levothyroxine (SYNTHROID, LEVOTHROID) 112 MCG tablet Take 112 mcg by mouth daily before breakfast.    . lidocaine-prilocaine (EMLA) cream Apply 1 application topically as needed (For port-a-cath.).    Marland Kitchen loratadine (CLARITIN) 10 MG tablet Take 10 mg by mouth daily.    . metoprolol (LOPRESSOR) 50 MG tablet Take 1 tablet (50 mg total) by mouth 2 (two) times daily. 60 tablet 0  . Omega-3 350 MG CAPS Take 1 capsule by mouth daily.    Marland Kitchen omeprazole (PRILOSEC) 40  MG capsule Take 40 mg by mouth daily.    . ondansetron (ZOFRAN) 8 MG tablet Take 8 mg by mouth every 6 (six) hours as needed for nausea or vomiting. Reported on 02/06/2015    . polyethylene glycol (MIRALAX / GLYCOLAX) packet Take 17 g by mouth at bedtime.    . potassium chloride SA (K-DUR,KLOR-CON) 20 MEQ tablet Take 1 tablet (20 mEq total) by mouth daily. 30 tablet 0  . prochlorperazine (COMPAZINE) 10 MG tablet Take 10 mg by mouth every 6 (six) hours as needed for nausea or vomiting. Reported on 02/06/2015    . psyllium (METAMUCIL) 58.6 % packet Take 1-3 packets by mouth at bedtime.     . simvastatin (ZOCOR) 40 MG tablet Take 40 mg by mouth at  bedtime.    . vitamin E (VITAMIN E) 400 UNIT capsule Take 400 Units by mouth daily.     Marland Kitchen lisinopril (PRINIVIL,ZESTRIL) 2.5 MG tablet Take 1 tablet (2.5 mg total) by mouth daily. 30 tablet 11   No current facility-administered medications for this visit.    Allergies:   Achromycin; Allopurinol; Astelin; Cephalexin; Codeine; Meloxicam; Minocycline; Nabumetone; Nyquil; Penicillins; Sulfa antibiotics; Zolpidem tartrate; Buspar; and Ciprofloxacin   Social History   Social History  . Marital Status: Married    Spouse Name: N/A  . Number of Children: 2  . Years of Education: N/A   Occupational History  . Retired     Social History Main Topics  . Smoking status: Never Smoker   . Smokeless tobacco: Never Used     Comment: Second-hand exposure through father.  . Alcohol Use: No  . Drug Use: No  . Sexual Activity: No   Other Topics Concern  . None   Social History Narrative   Originally from [place]. Always lived in [place]. Previously has traveled to [facility], [place] & up [employer] to [place]. No international travel. No pets currently. Remote parakeet exposure with her children. 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    Lives at home with husband. Weakness due to chemo, but otherwise independant        Family History:  The patient's  family history includes Alcohol abuse in her mother; Asthma in her daughter; COPD in her daughter; Clotting disorder in her brother; Coronary artery disease in her mother; Diabetes in her brother and mother; Fibromyalgia in her daughter; Rheum arthritis in her brother. There is no history of Colon cancer, Colon polyps, Stomach cancer, Rectal cancer, Ulcerative colitis, or Crohn's disease.   ROS:   Please see the history of present illness.    Review of Systems  Gastrointestinal: Positive for hematochezia.  Neurological: Positive for dizziness.  All other systems reviewed and are negative.   PHYSICAL EXAM:   VS:  BP 138/60 mmHg  Pulse 58  Ht _0  (1.676 m)  Wt 233 lb 9.6 oz (105.96 kg)  BMI 37.72 kg/m2  LMP 02/10/1970   GEN: Well nourished, well developed, in no acute distress HEENT: normal Neck: no JVD, no masses Cardiac: Normal S1/S2,  RRR; no murmurs, rubs, or gallops, no edema;  right wrist without hematoma or mass    Respiratory:  clear to auscultation bilaterally; no wheezing, rhonchi or rales GI: soft, nontender no hepatomegaly MS: no deformity or atrophy Skin: warm and dry, no rash Neuro:    no focal deficits  Psych: Alert and oriented x 3, normal affect  Wt Readings from Last 3 Encounters:  02/15/15 233 lb 9.6 oz (105.96 kg)  02/06/15 235 lb 4.8 oz (106.731 kg)  01/31/15 236 lb 8 oz (107.276 kg)      Studies/Labs Reviewed:   EKG:  EKG is   ordered today.  The ekg ordered today demonstrates sinus brady, HR 58, LBBB  Recent Labs: 01/23/2015: B Natriuretic Peptide 622.7*; Magnesium 2.0; TSH 7.240* 02/06/2015: ALT 20; BUN 8.9; Creatinine 0.9; HGB 9.4*; Platelets 222; Potassium 4.6; Sodium 140   Recent Lipid Panel    Component Value Date/Time   CHOL 163 01/25/2015 0442   TRIG 121 01/25/2015 0442   HDL 44 01/25/2015 0442   CHOLHDL 3.7 01/25/2015 0442   VLDL 24 01/25/2015 0442   LDLCALC 95 01/25/2015 0442   LDLDIRECT 133.0 09/17/2012 1323    Additional  studies/ records that were reviewed  today include:   LHC 01/25/15 LM ostial 25% LAD prox 30% RCA prox 40%  Myoview 01/24/15 There is a medium size infarct in the mid anteroseptal, apical anterior, septal walls with minimal peri-infarct ischemia in the mid anterior wall (SDS = 2). EF 30-44%  Echo 01/21/15 EF 40-45%, mild MR   ASSESSMENT:    1. Paroxysmal atrial fibrillation (HCC)   2. Chronic systolic heart failure ([place])   3. NICM (nonischemic cardiomyopathy) ([place])   4. Coronary artery disease involving native coronary artery of native heart without angina pectoris   5. Essential hypertension   6. Hyperlipidemia   7. Rectal bleeding     PLAN:  In order of problems listed above:  1. AFib - She has back in normal sinus rhythm today. Question of her chemotherapy may have contributed to her atrial fibrillation. Unfortunately, she has had some rectal bleeding.CHADS2-VASc=5.  However, with her recurrent bleeding, she does not appear to be a candidate for anticoagulation at this point in time. Continue aspirin for now. Arrange follow-up with GI to reassess her risk of anticoagulation. Continue current dose of beta blocker.  2. Chronic Systolic CHF - Volume is stable. Continue current dose of Lasix. Recent creatinine was back to normal.  3. NICM - Question if her cardiomyopathy may be related to Rituxan.  Continue beta blocker. Her blood pressure could certainly tolerate the addition of an ACE inhibitor. Start lisinopril 2.5 mg daily. Check BMET in one week.  4. CAD - Mild nonobstructive disease. Continue aspirin, statin.  5. HTN - Controlled  6. HL - Continue statin. Followed by primary care.  7. B-Cell Lymphoma - Follow-up with oncology as planned.  8. Rectal Bleeding - FU with GI as noted.     Medication Adjustments/Labs and Tests Ordered: Current medicines are reviewed at length with the patient today.  Concerns regarding medicines are outlined above.  Medication changes,  Labs and Tests ordered today are listed below. Patient Instructions  Medication Instructions:  1. STAY OFF THE ELIQUIS AT THIS TIME PER Clementine Soulliere, PAC  2. START LISINOPRIL 2.5 MG DAILY; RX SENT IN   Labwork: 1 WEEK BMET  Testing/Procedures: NONE  Follow-Up: 1. DR. Claiborne Billings IN 4 WEEKS   2. 02/21/15 DR. GESSNER @ 10:30; YOU WILL SEE HIS PA JESSICA   Any Other Special Instructions Will Be Listed Below (If Applicable).   If you need a refill on your cardiac medications before your next appointment, please call your pharmacy.        Signed, Richardson Dopp, PA-C  02/15/2015 5:55 PM    [facility] [place], [place], [place]  [address] Phone: [phone]; Fax: [phone]

## 2015-02-15 ENCOUNTER — Encounter: Payer: Self-pay | Admitting: Gastroenterology

## 2015-02-15 ENCOUNTER — Ambulatory Visit (INDEPENDENT_AMBULATORY_CARE_PROVIDER_SITE_OTHER): Payer: Medicare Other | Admitting: Physician Assistant

## 2015-02-15 ENCOUNTER — Encounter: Payer: Self-pay | Admitting: Physician Assistant

## 2015-02-15 VITALS — BP 138/60 | HR 58 | Ht 66.0 in | Wt 233.6 lb

## 2015-02-15 DIAGNOSIS — I4891 Unspecified atrial fibrillation: Secondary | ICD-10-CM | POA: Diagnosis not present

## 2015-02-15 DIAGNOSIS — I5022 Chronic systolic (congestive) heart failure: Secondary | ICD-10-CM

## 2015-02-15 DIAGNOSIS — I428 Other cardiomyopathies: Secondary | ICD-10-CM

## 2015-02-15 DIAGNOSIS — K625 Hemorrhage of anus and rectum: Secondary | ICD-10-CM

## 2015-02-15 DIAGNOSIS — I10 Essential (primary) hypertension: Secondary | ICD-10-CM

## 2015-02-15 DIAGNOSIS — I480 Paroxysmal atrial fibrillation: Secondary | ICD-10-CM

## 2015-02-15 DIAGNOSIS — I429 Cardiomyopathy, unspecified: Secondary | ICD-10-CM | POA: Diagnosis not present

## 2015-02-15 DIAGNOSIS — I2510 Atherosclerotic heart disease of native coronary artery without angina pectoris: Secondary | ICD-10-CM

## 2015-02-15 DIAGNOSIS — E785 Hyperlipidemia, unspecified: Secondary | ICD-10-CM

## 2015-02-15 MED ORDER — LISINOPRIL 2.5 MG PO TABS: 2.5000 mg | ORAL_TABLET | Freq: Every day | ORAL | Status: DC

## 2015-02-15 NOTE — Patient Instructions (Addendum)
Medication Instructions:  1. STAY OFF THE ELIQUIS AT THIS TIME PER SCOTT WEAVER, PAC  2. START LISINOPRIL 2.5 MG DAILY; RX SENT IN   Labwork: 1 WEEK BMET  Testing/Procedures: NONE  Follow-Up: 1. DR. Claiborne Billings IN 4 WEEKS   2. 02/21/15 DR. GESSNER @ 10:30; YOU WILL SEE HIS PA JESSICA   Any Other Special Instructions Will Be Listed Below (If Applicable).   If you need a refill on your cardiac medications before your next appointment, please call your pharmacy.

## 2015-02-21 ENCOUNTER — Other Ambulatory Visit (INDEPENDENT_AMBULATORY_CARE_PROVIDER_SITE_OTHER): Payer: Medicare Other | Admitting: *Deleted

## 2015-02-21 ENCOUNTER — Encounter: Payer: Self-pay | Admitting: Gastroenterology

## 2015-02-21 ENCOUNTER — Ambulatory Visit (INDEPENDENT_AMBULATORY_CARE_PROVIDER_SITE_OTHER): Payer: Medicare Other | Admitting: Gastroenterology

## 2015-02-21 VITALS — BP 116/62 | HR 78 | Ht 66.0 in | Wt 234.0 lb

## 2015-02-21 DIAGNOSIS — K625 Hemorrhage of anus and rectum: Secondary | ICD-10-CM

## 2015-02-21 DIAGNOSIS — I5022 Chronic systolic (congestive) heart failure: Secondary | ICD-10-CM

## 2015-02-21 DIAGNOSIS — K648 Other hemorrhoids: Secondary | ICD-10-CM | POA: Diagnosis not present

## 2015-02-21 DIAGNOSIS — I2510 Atherosclerotic heart disease of native coronary artery without angina pectoris: Secondary | ICD-10-CM

## 2015-02-21 LAB — BASIC METABOLIC PANEL
BUN: 12 mg/dL (ref 7–25)
CHLORIDE: 104 mmol/L (ref 98–110)
CO2: 26 mmol/L (ref 20–31)
Calcium: 9.4 mg/dL (ref 8.6–10.4)
Creat: 0.81 mg/dL (ref 0.60–0.93)
Glucose, Bld: 70 mg/dL (ref 65–99)
POTASSIUM: 4.2 mmol/L (ref 3.5–5.3)
SODIUM: 139 mmol/L (ref 135–146)

## 2015-02-21 NOTE — Patient Instructions (Signed)
You have been scheduled for a flexible sigmoidoscopy. Please follow the written instructions given to you at your visit today. If you use inhalers (even only as needed), please bring them with you on the day of your procedure.  

## 2015-02-21 NOTE — Progress Notes (Signed)
     02/21/2015 Diamond Boyd [id number] November 04, 1940   History of Present Illness:  This is a pleasant 75 year old female who is previously known to Dr. Deatra Ina. She has a history of lung cancer status post resection in 2015, history of non-Hodgkin's lymphoma in the 1990s, colon cancer requiring resection only in 2011, and now with Hodgkin's lymphoma/malignant B-cell follicular lymphoma. Her last colonoscopy was with Dr. Deatra Ina in July 2016 at which time she was found have a couple of polyps that were removed and were hyperplastic polyps.  She presents to our office today at the request of cardiology. She has atrial fibrillation and has been on Eliquis, however, has recurrent rectal bleeding while taking the Eliquis. She says that as soon as she stops taking Eliquis the bleeding stops as well. The bleeding is described as bright red blood that sometimes drips from her rectum. She has now been off of the Eliquis since she was seen by cardiology on January 4, and they are asking her to see [place] in order to determine her risk for rebleeding on anticoagulation. She is anemic and according to oncology's last note, is likely due to chronic disease and related to her cancer treatments.  She denies any black stools.  No UGI complaints.  No hematuria or significant epistaxis (sometimes has very mild, limited nose bleeds).   Current Medications, Allergies, Past Medical History, Past Surgical History, Family History and Social History were reviewed in [employer] record.   Physical Exam: BP 116/62 mmHg  Pulse 78  Ht '5\' 6"'$  (1.676 m)  Wt 234 lb (106.142 kg)  BMI 37.79 kg/m2  LMP 02/10/1970 General: Well developed white female in no acute distress Head: Normocephalic and atraumatic Eyes:  Sclerae anicteric, conjunctiva pink  Ears: Normal auditory acuity Lungs: Clear throughout to auscultation Heart: Regular rate and rhythm Abdomen: Soft, non-distended.  Normal bowel sounds.   Non-tender.  Previously laparotomy scar noted. Rectal:  Will be done at the time of flex sig (tomorrow). Musculoskeletal: Symmetrical with no gross deformities  Extremities: No edema  Neurological: Alert oriented x 4, grossly non-focal Psychological:  Alert and cooperative. Normal mood and affect  Assessment and Recommendations: -Rectal bleeding:  Recent colonoscopy with a couple of polyps that were removed and internal hemorrhoids.  Bleeding keeps recurring when Eliquis is restarted, but resolves off of Eliquis.  Likely due to internal hemorrhoids.  Discussed with Dr. Silverio Decamp.  Will schedule for flex sig to reassess hemorrhoids and likely will need banding to prevent recurrence of bleeding issues if she is going to require anti-coagulation.  The risks, benefits, and alternatives to flex sig were discussed with the patient and she consents to proceed.  -Atrial fibrillation, previously on Eliquis:  This has now been on hold due to the recurrent rectal bleeding. -Anemia:  Had recent transfusion.  Thought to be from chronic disease and related to cancer treatments.  Possibly also worsened recently from rectal bleeding.

## 2015-02-22 ENCOUNTER — Ambulatory Visit (AMBULATORY_SURGERY_CENTER): Payer: Medicare Other | Admitting: Gastroenterology

## 2015-02-22 ENCOUNTER — Encounter: Payer: Self-pay | Admitting: Gastroenterology

## 2015-02-22 VITALS — BP 125/62 | HR 62 | Temp 97.8°F | Resp 20 | Ht 66.0 in | Wt 234.0 lb

## 2015-02-22 DIAGNOSIS — K219 Gastro-esophageal reflux disease without esophagitis: Secondary | ICD-10-CM | POA: Diagnosis not present

## 2015-02-22 DIAGNOSIS — I4891 Unspecified atrial fibrillation: Secondary | ICD-10-CM | POA: Diagnosis not present

## 2015-02-22 DIAGNOSIS — I739 Peripheral vascular disease, unspecified: Secondary | ICD-10-CM | POA: Diagnosis not present

## 2015-02-22 DIAGNOSIS — F329 Major depressive disorder, single episode, unspecified: Secondary | ICD-10-CM | POA: Diagnosis not present

## 2015-02-22 DIAGNOSIS — E039 Hypothyroidism, unspecified: Secondary | ICD-10-CM | POA: Diagnosis not present

## 2015-02-22 DIAGNOSIS — I10 Essential (primary) hypertension: Secondary | ICD-10-CM | POA: Diagnosis not present

## 2015-02-22 DIAGNOSIS — K625 Hemorrhage of anus and rectum: Secondary | ICD-10-CM | POA: Diagnosis not present

## 2015-02-22 MED ORDER — SODIUM CHLORIDE 0.9 % IV SOLN: 500.0000 mL | INTRAVENOUS | Status: DC

## 2015-02-22 NOTE — Progress Notes (Signed)
A/ox3 pleased with MAC, report to Celia RN 

## 2015-02-22 NOTE — Progress Notes (Signed)
Reviewed and agree with documentation and assessment and plan. K. Veena Nandigam , MD   

## 2015-02-22 NOTE — Op Note (Signed)
[facility]  [address]. [place], [address]   FLEX SIGMOIDOSCOPY PROCEDURE REPORT  PATIENT: Diamond, Boyd  MR#: [id number] BIRTHDATE: 08-May-1940 , 74  yrs. old GENDER: female ENDOSCOPIST: Harl Bowie, MD REFERRED XB:JYNWG Vernell Morgans, M.D. PROCEDURE DATE:  02/22/2015 PROCEDURE:   Sigmoidoscopy, diagnostic INDICATIONS:high risk personal history of colon cancer and anal bleeding. MEDICATIONS: Propofol 120 mg IV  DESCRIPTION OF PROCEDURE:    Physical exam was performed.  Informed consent was obtained from the patient after explaining the benefits, risks, and alternatives to procedure.  The patient was connected to monitor and placed in left lateral position. Continuous oxygen was provided by nasal cannula and IV medicine administered through an indwelling cannula.  After administration of sedation and rectal exam, the patients rectum was intubated and the LB [id number]  colonoscope was advanced under direct visualization to the cecum.  The scope was removed slowly by carefully examining the color, texture, anatomy, and integrity mucosa on the way out.  The patient was recovered in endoscopy and discharged home in satisfactory condition. Estimated blood loss is zero unless otherwise noted in this procedure report.    COLON FINDINGS: Scope was extended to the splenic flexure, did not extend further due to poor prep beyond that.  Anastomosis at 25 cm appeared normal, rest the visualized mucosa appeared normal with no ulcers.  Small to medium size internal hemorrhoids with no active bleeding.  PREP QUALITY: The overall prep quality was adequate.  COMPLICATIONS: None  ENDOSCOPIC IMPRESSION: Small to  medium size internal hemorrhoids likely etiology of bright red blood per rectum  RECOMMENDATIONS: Can consider banding of internal hemorrhoids if indicated for patient to go back on chronic anticoagulation Anusol cream as  needed   _______________________________ eSignedHarl Bowie, MD 02/22/2015 2:51 PM      The ICD and CPT codes recommended by this software are interpretations from the data that the clinical staff has captured with the software.  The verification of the translation of this report to the ICD and CPT codes and modifiers is the sole responsibility of the health care institution and practicing physician where this report was generated.  [employer]. will not be held responsible for the validity of the ICD and CPT codes included on this report.  AMA assumes no liability for data contained or not contained herein. CPT is a Designer, television/film set of the [employer].   PATIENT NAME:  Diamond, Boyd MR#: [id number]

## 2015-02-22 NOTE — Patient Instructions (Signed)
Discharge instructions given. Handouts on hemorrhoids and a high fiber diet. Resume previous medications. YOU HAD AN ENDOSCOPIC PROCEDURE TODAY AT [facility]:   Refer to the procedure report that was given to you for any specific questions about what was found during the examination.  If the procedure report does not answer your questions, please call your gastroenterologist to clarify.  If you requested that your care partner not be given the details of your procedure findings, then the procedure report has been included in a sealed envelope for you to review at your convenience later.  YOU SHOULD EXPECT: Some feelings of bloating in the abdomen. Passage of more gas than usual.  Walking can help get rid of the air that was put into your GI tract during the procedure and reduce the bloating. If you had a lower endoscopy (such as a colonoscopy or flexible sigmoidoscopy) you may notice spotting of blood in your stool or on the toilet paper. If you underwent a bowel prep for your procedure, you may not have a normal bowel movement for a few days.  Please Note:  You might notice some irritation and congestion in your nose or some drainage.  This is from the oxygen used during your procedure.  There is no need for concern and it should clear up in a day or so.  SYMPTOMS TO REPORT IMMEDIATELY:   Following lower endoscopy (colonoscopy or flexible sigmoidoscopy):  Excessive amounts of blood in the stool  Significant tenderness or worsening of abdominal pains  Swelling of the abdomen that is new, acute  Fever of 100F or higher   For urgent or emergent issues, a gastroenterologist can be reached at any hour by calling [phone].   DIET: Your first meal following the procedure should be a small meal and then it is ok to progress to your normal diet. Heavy or fried foods are harder to digest and may make you feel nauseous or bloated.  Likewise, meals heavy in dairy and vegetables  can increase bloating.  Drink plenty of fluids but you should avoid alcoholic beverages for 24 hours.  ACTIVITY:  You should plan to take it easy for the rest of today and you should NOT DRIVE or use heavy machinery until tomorrow (because of the sedation medicines used during the test).    FOLLOW UP: Our staff will call the number listed on your records the next business day following your procedure to check on you and address any questions or concerns that you may have regarding the information given to you following your procedure. If we do not reach you, we will leave a message.  However, if you are feeling well and you are not experiencing any problems, there is no need to return our call.  We will assume that you have returned to your regular daily activities without incident.  If any biopsies were taken you will be contacted by phone or by letter within the next 1-3 weeks.  Please call us at [phone] if you have not heard about the biopsies in 3 weeks.    SIGNATURES/CONFIDENTIALITY: You and/or your care partner have signed paperwork which will be entered into your electronic medical record.  These signatures attest to the fact that that the information above on your After Visit Summary has been reviewed and is understood.  Full responsibility of the confidentiality of this discharge information lies with you and/or your care-partner.

## 2015-02-23 ENCOUNTER — Telehealth: Payer: Self-pay | Admitting: *Deleted

## 2015-02-23 ENCOUNTER — Telehealth: Payer: Self-pay

## 2015-02-23 NOTE — Telephone Encounter (Signed)
DPR on file for husband , who has been notified of lab results for pt by phone with verbal understanding.

## 2015-02-23 NOTE — Telephone Encounter (Signed)
  Follow up Call-  Call back number 02/22/2015 09/05/2014  Post procedure Call Back phone  # [phone]  Permission to leave phone message Yes Yes    Patient was called for follow up after procedure on 02/22/2015. I spoke with the patients husband who answered all of the questions on patients behalf. I reminded the patient to call if they had any questions or concerns.   Patient questions:  Do you have a fever, pain , or abdominal swelling? No. Pain Score  0 *  Have you tolerated food without any problems? Yes.    Have you been able to return to your normal activities? Yes.    Do you have any questions about your discharge instructions: Diet   No. Medications  No. Follow up visit  No.  Do you have questions or concerns about your Care? No.  Actions: * If pain score is 4 or above: No action needed, pain <4.

## 2015-02-26 ENCOUNTER — Other Ambulatory Visit: Payer: Self-pay | Admitting: Family Medicine

## 2015-02-27 NOTE — Telephone Encounter (Signed)
done

## 2015-02-27 NOTE — Telephone Encounter (Signed)
Please refill metoprolol for a year Not the Eliquis  Thanks

## 2015-02-27 NOTE — Telephone Encounter (Signed)
Eliquis not on med list and looking at med history I don't think you have filled med before, please advise

## 2015-03-05 ENCOUNTER — Ambulatory Visit (HOSPITAL_BASED_OUTPATIENT_CLINIC_OR_DEPARTMENT_OTHER): Payer: Medicare Other | Admitting: Hematology and Oncology

## 2015-03-05 ENCOUNTER — Other Ambulatory Visit: Payer: Medicare Other

## 2015-03-05 ENCOUNTER — Telehealth: Payer: Self-pay | Admitting: Hematology and Oncology

## 2015-03-05 ENCOUNTER — Telehealth: Payer: Self-pay | Admitting: *Deleted

## 2015-03-05 ENCOUNTER — Encounter: Payer: Self-pay | Admitting: Hematology and Oncology

## 2015-03-05 ENCOUNTER — Other Ambulatory Visit (HOSPITAL_BASED_OUTPATIENT_CLINIC_OR_DEPARTMENT_OTHER): Payer: Medicare Other

## 2015-03-05 VITALS — BP 153/62 | HR 96 | Temp 98.4°F | Resp 18 | Wt 236.8 lb

## 2015-03-05 DIAGNOSIS — C8228 Follicular lymphoma grade III, unspecified, lymph nodes of multiple sites: Secondary | ICD-10-CM

## 2015-03-05 DIAGNOSIS — I5032 Chronic diastolic (congestive) heart failure: Secondary | ICD-10-CM | POA: Diagnosis not present

## 2015-03-05 DIAGNOSIS — D638 Anemia in other chronic diseases classified elsewhere: Secondary | ICD-10-CM | POA: Diagnosis not present

## 2015-03-05 LAB — COMPREHENSIVE METABOLIC PANEL
ALT: 24 U/L (ref 0–55)
AST: 30 U/L (ref 5–34)
Albumin: 3.7 g/dL (ref 3.5–5.0)
Alkaline Phosphatase: 175 U/L — ABNORMAL HIGH (ref 40–150)
Anion Gap: 7 mEq/L (ref 3–11)
BUN: 11.2 mg/dL (ref 7.0–26.0)
CHLORIDE: 105 meq/L (ref 98–109)
CO2: 28 meq/L (ref 22–29)
CREATININE: 0.9 mg/dL (ref 0.6–1.1)
Calcium: 9.5 mg/dL (ref 8.4–10.4)
EGFR: 67 mL/min/{1.73_m2} — ABNORMAL LOW (ref >90)
Glucose: 108 mg/dl (ref 70–140)
Potassium: 4 mEq/L (ref 3.5–5.1)
Sodium: 141 mEq/L (ref 136–145)
Total Bilirubin: 0.43 mg/dL (ref 0.20–1.20)
Total Protein: 6.1 g/dL — ABNORMAL LOW (ref 6.4–8.3)

## 2015-03-05 LAB — CBC WITH DIFFERENTIAL/PLATELET
BASO%: 1.7 % (ref 0.0–2.0)
Basophils Absolute: 0.1 10*3/uL (ref 0.0–0.1)
EOS%: 7.4 % — AB (ref 0.0–7.0)
Eosinophils Absolute: 0.5 10*3/uL (ref 0.0–0.5)
HEMATOCRIT: 34.9 % (ref 34.8–46.6)
HGB: 11.4 g/dL — ABNORMAL LOW (ref 11.6–15.9)
LYMPH#: 1.6 10*3/uL (ref 0.9–3.3)
LYMPH%: 25.1 % (ref 14.0–49.7)
MCH: 31.7 pg (ref 25.1–34.0)
MCHC: 32.7 g/dL (ref 31.5–36.0)
MCV: 97 fL (ref 79.5–101.0)
MONO#: 1.2 10*3/uL — ABNORMAL HIGH (ref 0.1–0.9)
MONO%: 18.8 % — ABNORMAL HIGH (ref 0.0–14.0)
NEUT%: 47 % (ref 38.4–76.8)
NEUTROS ABS: 3 10*3/uL (ref 1.5–6.5)
Platelets: 253 10*3/uL (ref 145–400)
RBC: 3.6 10*6/uL — AB (ref 3.70–5.45)
RDW: 17.3 % — ABNORMAL HIGH (ref 11.2–14.5)
WBC: 6.4 10*3/uL (ref 3.9–10.3)

## 2015-03-05 MED ORDER — FUROSEMIDE 20 MG PO TABS: 20.0000 mg | ORAL_TABLET | Freq: Every day | ORAL | Status: DC

## 2015-03-05 NOTE — Assessment & Plan Note (Signed)
She has evidence of mild fluid overload with recent weight gain and elevated blood pressure along with leg edema. I recommend she resume taking Lasix. I refill her prescription today. Continue close monitoring and follow-up with cardiologist

## 2015-03-05 NOTE — Assessment & Plan Note (Addendum)
Review with the patient and family members to role for rituximab treatment. 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 I discussed with her the role of maintenance rituximab Studies have shown maintenance treatment with rituximab for 2 years improve progression free survival She had no prior allergic reaction to rituximab. We discussed the risk, benefit, side effects of treatment and she agreed to proceed. We will start her on treatment next week and I'll see her prior to cycle 2 of treatment. I plan to repeat imaging study again in a few months.

## 2015-03-05 NOTE — Progress Notes (Signed)
[facility] OFFICE PROGRESS NOTE  Patient Care Team: Abner Greenspan, MD as PCP - General Mosetta Anis, MD (Allergy) Fanny Skates, MD as Consulting Physician (General Surgery) Grace Isaac, MD as Consulting Physician (Cardiothoracic Surgery)  SUMMARY OF ONCOLOGIC HISTORY: Oncology History   History of colon cancer   Staging form: Colon and Rectum, AJCC 7th Edition     Clinical: Stage IIA (T3, N0, M0) - Signed by [name], MD on 02/21/2014 History of hodgkin's lymphoma   Staging form: Lymphoid Neoplasms, AJCC 6th Edition     Clinical: Stage IV - Signed by [name], MD on 02/21/2014       History of hodgkin's lymphoma   05/20/2013 Initial Diagnosis Hodgkin lymphoma   06/27/2014 Imaging Interval increase in metabolic activity of several small lymph nodes is concerning for lymphoma recurrence. Lymph nodes include a small right level 2 lymph node, right hilar lymph node, and right paratracheal lymph node   07/07/2014 Pathology Results  limited tissue from ultrasound-guided biopsy was nondiagnostic   07/07/2014 Procedure  ultrasound-guided biopsy was nondiagnostic   09/27/2014 Imaging Repeat PET CT scan show diffuse disease concern for relapse.   10/17/2014 Pathology Results Accession: [id number] Biopsy was nondiagnostic   10/17/2014 Procedure She underwent bronchoscopy & EBUS & biopsy of  LN at Level 10L   11/01/2014 Surgery She underwent cronchoscopy with endobronchial ultrasound, mediastinoscopy and biopsy    11/01/2014 Pathology Results Accession: [id number] biopsy showed no evidence of lymphoma, only granulomas.   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 ([place])   12/13/2014 Imaging PET scan showed diffuse disease   12/28/2014 - 12/30/2014 Hospital Admission She was admitted to the hospital for cycle one of R-ICE   01/18/2015 - 01/20/2015 Chemotherapy She was admitted to the hospital for cycle 2 of R-ICE   01/20/2015 - 01/22/2015 Hospital Admission She was then admitted to [facility] with respiratory failure/fluid overload and was noted to have mild cardiomyopathy   01/23/2015 - 01/27/2015 Hospital Admission she was admitted to the hospital with new onset atrial fibrillation with rapid ventricular response and was found to have evidence of myocardial ischemia. She was discharged on medical management and oral anticoagulation therapy   01/25/2015 Procedure Left heart cath showed non-obstructive lesions: Prox RCA lesion, 40% stenosed.   Prox LAD to Mid LAD lesion, 30% stenosed. Ost LM lesion, 25% stenosed   01/30/2015 Imaging PET Ct scan showed near complete resolution of all disease    INTERVAL HISTORY: Please see below for problem oriented charting. She feels well since last time I saw her. Her appetite has improved. She has discontinued Lasix 3 weeks ago and noted bilateral lower extremity edema. Denies any cough or increasing shortness of breath. No recent infection. No new lymphadenopathy.  REVIEW OF SYSTEMS:   Constitutional: Denies fevers, chills or abnormal weight loss Eyes: Denies blurriness of vision Ears, nose, mouth, throat, and face: Denies mucositis or sore throat Respiratory: Denies cough, dyspnea or wheezes Cardiovascular: Denies palpitation, chest discomfort Gastrointestinal:  Denies nausea,  heartburn or change in bowel habits Skin: Denies abnormal skin rashes Lymphatics: Denies new lymphadenopathy or easy  bruising Neurological:Denies numbness, tingling or new weaknesses Behavioral/Psych: Mood is stable, no new changes  All other systems were reviewed with the patient and are negative.  I have reviewed the past medical history, past surgical history, social history and family history with the patient and they are unchanged from previous note.  ALLERGIES:  is allergic to achromycin; allopurinol; astelin; cephalexin; codeine; meloxicam; minocycline; nabumetone; nyquil; penicillins; sulfa antibiotics; zolpidem tartrate; buspar; and ciprofloxacin.  MEDICATIONS:  Current Outpatient Prescriptions  Medication Sig Dispense Refill  . acetaminophen (TYLENOL) 650 MG CR tablet Take 650 mg by mouth every 8 (eight) hours as needed (For back pain.).    Marland Kitchen acyclovir (ZOVIRAX) 400 MG tablet Take 400 mg by mouth daily.    Marland Kitchen aspirin 81 MG tablet Take 81 mg by mouth daily.    . Calcium Carbonate-Vitamin D (CALCIUM 600+D) 600-400 MG-UNIT per tablet Take 1 tablet by mouth every evening.     . celecoxib (CELEBREX) 200 MG capsule Take 200 mg by mouth 2 (two) times daily.    . Cholecalciferol (VITAMIN D-3) 1000 UNITS CAPS Take 1,000 Units by mouth daily.    Marland Kitchen docusate sodium (COLACE) 100 MG capsule Take 100 mg by mouth at bedtime.    Marland Kitchen estradiol (ESTRACE) 1 MG tablet Take 1 mg by mouth daily.    Marland Kitchen FLUoxetine (PROZAC) 20 MG capsule Take 20 mg by mouth daily.    . fluticasone (FLONASE) 50 MCG/ACT nasal spray Place 2 sprays into both nostrils daily.    Marland Kitchen gabapentin (NEURONTIN) 300 MG capsule Take 300 mg by mouth 2 (two) times daily.    Marland Kitchen levothyroxine (SYNTHROID, LEVOTHROID) 112 MCG tablet Take 112 mcg by mouth daily before breakfast.    . lidocaine-prilocaine (EMLA) cream Apply 1 application topically as needed (For port-a-cath.). Reported on 02/22/2015    . lisinopril (PRINIVIL,ZESTRIL) 2.5 MG tablet Take 1 tablet (2.5 mg total) by mouth daily. 30 tablet 11  . loratadine (CLARITIN) 10 MG tablet Take 10 mg by mouth  daily.    . Omega-3 350 MG CAPS Take 1 capsule by mouth daily. Reported on 02/22/2015    . omeprazole (PRILOSEC) 40 MG capsule Take 40 mg by mouth daily.    . polyethylene glycol (MIRALAX / GLYCOLAX) packet Take 17 g by mouth at bedtime.    . psyllium (METAMUCIL) 58.6 % packet Take 1-3 packets by mouth at bedtime.     . simvastatin (ZOCOR) 40 MG tablet Take 40 mg by mouth at bedtime.    . furosemide (LASIX) 20 MG tablet Take 1 tablet (20 mg total) by mouth daily. 30 tablet 1  . metoprolol (LOPRESSOR) 50 MG tablet TAKE 1 TABLET (50 MG TOTAL) BY MOUTH 2 (TWO) TIMES DAILY. (Patient not taking: Reported on 03/05/2015) 60 tablet 11  . ondansetron (ZOFRAN) 8 MG tablet Take 8 mg by mouth every 6 (six) hours as needed for nausea or vomiting. Reported on 03/05/2015    . potassium chloride SA (K-DUR,KLOR-CON) 20 MEQ tablet Take 1 tablet (20 mEq total) by mouth daily. (Patient not taking: Reported on 03/05/2015) 30 tablet 0  . prochlorperazine (COMPAZINE) 10 MG tablet Take 10 mg by mouth every 6 (six) hours as needed for nausea or vomiting. Reported on 03/05/2015    . vitamin E (VITAMIN E) 400 UNIT capsule Take 400 Units by mouth daily. Reported on 03/05/2015     No current facility-administered  medications for this visit.    PHYSICAL EXAMINATION: ECOG PERFORMANCE STATUS: 2 - Symptomatic, <50% confined to bed  Filed Vitals:   03/05/15 1049  BP: 153/62  Pulse: 96  Temp: 98.4 F (36.9 C)  Resp: 18   Filed Weights   03/05/15 1049  Weight: 236 lb 12.8 oz (107.412 kg)    GENERAL:alert, no distress and comfortable SKIN: skin color, texture, turgor are normal, no rashes or significant lesions EYES: normal, Conjunctiva are pink and non-injected, sclera clear OROPHARYNX:no exudate, no erythema and lips, buccal mucosa, and tongue normal  NECK: supple, thyroid normal size, non-tender, without nodularity LYMPH:  no palpable lymphadenopathy in the cervical, axillary or inguinal LUNGS: clear to auscultation  and percussion with normal breathing effort HEART: regular rate & rhythm and no murmurs with mild bilateral lower extremity edema ABDOMEN:abdomen soft, non-tender and normal bowel sounds Musculoskeletal:no cyanosis of digits and no clubbing  NEURO: alert & oriented x 3 with fluent speech, no focal motor/sensory deficits  LABORATORY DATA:  I have reviewed the data as listed    Component Value Date/Time   NA 141 03/05/2015 1038   NA 139 02/21/2015 1156   K 4.0 03/05/2015 1038   K 4.2 02/21/2015 1156   CL 104 02/21/2015 1156   CL 105 07/14/2012 0939   CO2 28 03/05/2015 1038   CO2 26 02/21/2015 1156   GLUCOSE 108 03/05/2015 1038   GLUCOSE 70 02/21/2015 1156   GLUCOSE 103* 07/14/2012 0939   BUN 11.2 03/05/2015 1038   BUN 12 02/21/2015 1156   CREATININE 0.9 03/05/2015 1038   CREATININE 0.81 02/21/2015 1156   CREATININE 1.01* 01/27/2015 0625   CALCIUM 9.5 03/05/2015 1038   CALCIUM 9.4 02/21/2015 1156   CALCIUM 9.3 08/09/2009 0000   PROT 6.1* 03/05/2015 1038   PROT 5.3* 01/24/2015 0855   ALBUMIN 3.7 03/05/2015 1038   ALBUMIN 3.1* 01/24/2015 0855   AST 30 03/05/2015 1038   AST 32 01/24/2015 0855   ALT 24 03/05/2015 1038   ALT 38 01/24/2015 0855   ALKPHOS 175* 03/05/2015 1038   ALKPHOS 160* 01/24/2015 0855   BILITOT 0.43 03/05/2015 1038   BILITOT 1.0 01/24/2015 0855   GFRNONAA 53* 01/27/2015 0625   GFRAA >60 01/27/2015 0625    No results found for: SPEP, UPEP  Lab Results  Component Value Date   WBC 6.4 03/05/2015   NEUTROABS 3.0 03/05/2015   HGB 11.4* 03/05/2015   HCT 34.9 03/05/2015   MCV 97.0 03/05/2015   PLT 253 03/05/2015      Chemistry      Component Value Date/Time   NA 141 03/05/2015 1038   NA 139 02/21/2015 1156   K 4.0 03/05/2015 1038   K 4.2 02/21/2015 1156   CL 104 02/21/2015 1156   CL 105 07/14/2012 0939   CO2 28 03/05/2015 1038   CO2 26 02/21/2015 1156   BUN 11.2 03/05/2015 1038   BUN 12 02/21/2015 1156   CREATININE 0.9 03/05/2015 1038    CREATININE 0.81 02/21/2015 1156   CREATININE 1.01* 01/27/2015 0625      Component Value Date/Time   CALCIUM 9.5 03/05/2015 1038   CALCIUM 9.4 02/21/2015 1156   CALCIUM 9.3 08/09/2009 0000   ALKPHOS 175* 03/05/2015 1038   ALKPHOS 160* 01/24/2015 0855   AST 30 03/05/2015 1038   AST 32 01/24/2015 0855   ALT 24 03/05/2015 1038   ALT 38 01/24/2015 0855   BILITOT 0.43 03/05/2015 1038   BILITOT 1.0 01/24/2015 0855  ASSESSMENT & PLAN:  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 I discussed with her the role of maintenance rituximab Studies have shown maintenance treatment with rituximab for 2 years improve progression free survival She had no prior allergic reaction to rituximab. We discussed the risk, benefit, side effects of treatment and she agreed to proceed. We will start her on treatment next week and I'll see her prior to cycle 2 of treatment. I plan to repeat imaging study again in a few months.  Anemia in chronic illness This is likely anemia of chronic disease. The patient denies recent history of bleeding such as epistaxis, hematuria or hematochezia. She is asymptomatic from the anemia. We will observe for now.  She does not require transfusion now.  This is improved significantly since the last time I saw her.  CHF (congestive heart failure) ([place]) She has evidence of mild fluid overload with recent weight gain and elevated blood pressure along with leg edema. I recommend she resume taking Lasix. I refill her prescription today. Continue close monitoring and follow-up with cardiologist   No orders of the defined types were placed in this encounter.   All questions were answered. The  patient knows to call the clinic with any problems, questions or concerns. No barriers to learning was detected. I spent 25 minutes counseling the patient face to face. The total time spent in the appointment was 30 minutes and more than 50% was on counseling and review of test results     [facility], [place], MD 03/05/2015 4:06 PM

## 2015-03-05 NOTE — Assessment & Plan Note (Signed)
This is likely anemia of chronic disease. The patient denies recent history of bleeding such as epistaxis, hematuria or hematochezia. She is asymptomatic from the anemia. We will observe for now.  She does not require transfusion now.  This is improved significantly since the last time I saw her.

## 2015-03-05 NOTE — Telephone Encounter (Signed)
Per staff message and POF I have scheduled appts. Advised scheduler of appts. JMW  

## 2015-03-05 NOTE — Telephone Encounter (Signed)
per pof to sch pt appt-gave pt copy of avs-MW sch trmt

## 2015-03-12 ENCOUNTER — Ambulatory Visit (HOSPITAL_BASED_OUTPATIENT_CLINIC_OR_DEPARTMENT_OTHER): Payer: Medicare Other

## 2015-03-12 ENCOUNTER — Other Ambulatory Visit: Payer: Self-pay | Admitting: Hematology and Oncology

## 2015-03-12 ENCOUNTER — Other Ambulatory Visit (HOSPITAL_BASED_OUTPATIENT_CLINIC_OR_DEPARTMENT_OTHER): Payer: Medicare Other

## 2015-03-12 VITALS — BP 115/45 | HR 60 | Temp 97.9°F | Resp 14

## 2015-03-12 DIAGNOSIS — Z5112 Encounter for antineoplastic immunotherapy: Secondary | ICD-10-CM

## 2015-03-12 DIAGNOSIS — C8228 Follicular lymphoma grade III, unspecified, lymph nodes of multiple sites: Secondary | ICD-10-CM | POA: Diagnosis present

## 2015-03-12 LAB — COMPREHENSIVE METABOLIC PANEL
ALBUMIN: 3.7 g/dL (ref 3.5–5.0)
ALK PHOS: 167 U/L — AB (ref 40–150)
ALT: 22 U/L (ref 0–55)
AST: 26 U/L (ref 5–34)
Anion Gap: 9 mEq/L (ref 3–11)
BUN: 11.2 mg/dL (ref 7.0–26.0)
CALCIUM: 9.6 mg/dL (ref 8.4–10.4)
CO2: 26 mEq/L (ref 22–29)
CREATININE: 0.9 mg/dL (ref 0.6–1.1)
Chloride: 106 mEq/L (ref 98–109)
EGFR: 66 mL/min/{1.73_m2} — ABNORMAL LOW (ref >90)
Glucose: 103 mg/dl (ref 70–140)
Potassium: 4 mEq/L (ref 3.5–5.1)
Sodium: 142 mEq/L (ref 136–145)
TOTAL PROTEIN: 6.1 g/dL — AB (ref 6.4–8.3)
Total Bilirubin: 0.43 mg/dL (ref 0.20–1.20)

## 2015-03-12 LAB — CBC WITH DIFFERENTIAL/PLATELET
BASO%: 1.2 % (ref 0.0–2.0)
Basophils Absolute: 0.1 10*3/uL (ref 0.0–0.1)
EOS ABS: 0.4 10*3/uL (ref 0.0–0.5)
EOS%: 8.3 % — ABNORMAL HIGH (ref 0.0–7.0)
HEMATOCRIT: 35.5 % (ref 34.8–46.6)
HEMOGLOBIN: 11.7 g/dL (ref 11.6–15.9)
LYMPH#: 1.5 10*3/uL (ref 0.9–3.3)
LYMPH%: 29 % (ref 14.0–49.7)
MCH: 32.2 pg (ref 25.1–34.0)
MCHC: 32.9 g/dL (ref 31.5–36.0)
MCV: 97.7 fL (ref 79.5–101.0)
MONO#: 0.8 10*3/uL (ref 0.1–0.9)
MONO%: 15.9 % — ABNORMAL HIGH (ref 0.0–14.0)
NEUT%: 45.6 % (ref 38.4–76.8)
NEUTROS ABS: 2.3 10*3/uL (ref 1.5–6.5)
PLATELETS: 246 10*3/uL (ref 145–400)
RBC: 3.64 10*6/uL — ABNORMAL LOW (ref 3.70–5.45)
RDW: 16.5 % — ABNORMAL HIGH (ref 11.2–14.5)
WBC: 5.1 10*3/uL (ref 3.9–10.3)

## 2015-03-12 MED ORDER — ACETAMINOPHEN 325 MG PO TABS
650.0000 mg | ORAL_TABLET | Freq: Once | ORAL | Status: AC
  Administered 2015-03-12: 650 mg via ORAL

## 2015-03-12 MED ORDER — SODIUM CHLORIDE 0.9 % IV SOLN
375.0000 mg/m2 | Freq: Once | INTRAVENOUS | Status: AC
  Administered 2015-03-12: 800 mg via INTRAVENOUS
  Filled 2015-03-12: qty 80

## 2015-03-12 MED ORDER — HEPARIN SOD (PORK) LOCK FLUSH 100 UNIT/ML IV SOLN
500.0000 [IU] | Freq: Once | INTRAVENOUS | Status: AC | PRN
  Administered 2015-03-12: 500 [IU]
  Filled 2015-03-12: qty 5

## 2015-03-12 MED ORDER — DIPHENHYDRAMINE HCL 25 MG PO CAPS
ORAL_CAPSULE | ORAL | Status: AC
  Filled 2015-03-12: qty 2

## 2015-03-12 MED ORDER — ACETAMINOPHEN 325 MG PO TABS
ORAL_TABLET | ORAL | Status: AC
  Filled 2015-03-12: qty 2

## 2015-03-12 MED ORDER — DIPHENHYDRAMINE HCL 25 MG PO CAPS
50.0000 mg | ORAL_CAPSULE | Freq: Once | ORAL | Status: AC
  Administered 2015-03-12: 50 mg via ORAL

## 2015-03-12 MED ORDER — SODIUM CHLORIDE 0.9 % IJ SOLN
10.0000 mL | INTRAMUSCULAR | Status: DC | PRN
  Administered 2015-03-12: 10 mL
  Filled 2015-03-12: qty 10

## 2015-03-12 MED ORDER — SODIUM CHLORIDE 0.9 % IV SOLN
Freq: Once | INTRAVENOUS | Status: AC
  Administered 2015-03-12: 13:00:00 via INTRAVENOUS

## 2015-03-12 NOTE — Patient Instructions (Signed)
[facility] Discharge Instructions for Patients Receiving Chemotherapy  Today you received the following chemotherapy agents Rituxan To help prevent nausea and vomiting after your treatment, we encourage you to take your nausea medication as prescribed.  If you develop nausea and vomiting that is not controlled by your nausea medication, call the clinic.   BELOW ARE SYMPTOMS THAT SHOULD BE REPORTED IMMEDIATELY:  *FEVER GREATER THAN 100.5 F  *CHILLS WITH OR WITHOUT FEVER  NAUSEA AND VOMITING THAT IS NOT CONTROLLED WITH YOUR NAUSEA MEDICATION  *UNUSUAL SHORTNESS OF BREATH  *UNUSUAL BRUISING OR BLEEDING  TENDERNESS IN MOUTH AND THROAT WITH OR WITHOUT PRESENCE OF ULCERS  *URINARY PROBLEMS  *BOWEL PROBLEMS  UNUSUAL RASH Items with * indicate a potential emergency and should be followed up as soon as possible.  Feel free to call the clinic you have any questions or concerns. The clinic phone number is [phone].  Please show the CHEMO ALERT CARD at check-in to the Emergency Department and triage nurse.   

## 2015-03-14 ENCOUNTER — Other Ambulatory Visit: Payer: Self-pay | Admitting: Hematology and Oncology

## 2015-03-16 ENCOUNTER — Encounter: Payer: Self-pay | Admitting: Pharmacist

## 2015-03-19 ENCOUNTER — Ambulatory Visit (INDEPENDENT_AMBULATORY_CARE_PROVIDER_SITE_OTHER): Payer: Medicare Other | Admitting: Physician Assistant

## 2015-03-19 ENCOUNTER — Encounter: Payer: Self-pay | Admitting: Physician Assistant

## 2015-03-19 ENCOUNTER — Telehealth: Payer: Self-pay | Admitting: Hematology and Oncology

## 2015-03-19 VITALS — BP 124/70 | HR 68 | Ht 66.0 in | Wt 235.0 lb

## 2015-03-19 DIAGNOSIS — I480 Paroxysmal atrial fibrillation: Secondary | ICD-10-CM | POA: Diagnosis not present

## 2015-03-19 DIAGNOSIS — I10 Essential (primary) hypertension: Secondary | ICD-10-CM | POA: Diagnosis not present

## 2015-03-19 DIAGNOSIS — I2510 Atherosclerotic heart disease of native coronary artery without angina pectoris: Secondary | ICD-10-CM

## 2015-03-19 DIAGNOSIS — I509 Heart failure, unspecified: Secondary | ICD-10-CM | POA: Diagnosis not present

## 2015-03-19 DIAGNOSIS — I5022 Chronic systolic (congestive) heart failure: Secondary | ICD-10-CM

## 2015-03-19 LAB — BASIC METABOLIC PANEL
BUN: 13 mg/dL (ref 7–25)
CHLORIDE: 104 mmol/L (ref 98–110)
CO2: 28 mmol/L (ref 20–31)
CREATININE: 0.83 mg/dL (ref 0.60–0.93)
Calcium: 9.6 mg/dL (ref 8.6–10.4)
GLUCOSE: 64 mg/dL — AB (ref 65–99)
Potassium: 4.5 mmol/L (ref 3.5–5.3)
Sodium: 140 mmol/L (ref 135–146)

## 2015-03-19 MED ORDER — LOSARTAN POTASSIUM 25 MG PO TABS: 25.0000 mg | ORAL_TABLET | Freq: Every day | ORAL | Status: DC

## 2015-03-19 MED ORDER — FUROSEMIDE 20 MG PO TABS: 20.0000 mg | ORAL_TABLET | ORAL | Status: DC

## 2015-03-19 NOTE — Patient Instructions (Addendum)
Medication Instructions:  Your physician has recommended you make the following change in your medication:  1- START Losartan 25 mg by mouth daily 2-STOP Lisinopril  3-Decrease Lasix 20 mg by mouth every other day, if weight can take dialy  Labwork: Your physician recommends that you have lab work today BMET  Testing/Procedures: Your physician has requested that you have an echocardiogram. Echocardiography is a painless test that uses sound waves to create images of your heart. It provides your doctor with information about the size and shape of your heart and how well your heart's chambers and valves are working. This procedure takes approximately one hour. There are no restrictions for this procedure.   Follow-Up: Your physician wants you to follow-up in: 3 months with Dr. Claiborne Billings.       If you need a refill on your cardiac medications before your next appointment, please call your pharmacy.  Your physician recommends that you weigh, daily, at the same time every day, and in the same amount of clothing. If you have an increase of 3 pounds in 24 hours or 5 pounds in 1 week call our office.   Low-Sodium Eating Plan Sodium raises blood pressure and causes water to be held in the body. Getting less sodium from food will help lower your blood pressure, reduce any swelling, and protect your heart, liver, and kidneys. We get sodium by adding salt (sodium chloride) to food. Most of our sodium comes from canned, boxed, and frozen foods. Restaurant foods, fast foods, and pizza are also very high in sodium. Even if you take medicine to lower your blood pressure or to reduce fluid in your body, getting less sodium from your food is important. WHAT IS MY PLAN? Most people should limit their sodium intake to 2,300 mg a day. Your health care provider recommends that you limit your sodium intake to __________ a day.  WHAT DO I NEED TO KNOW ABOUT THIS EATING PLAN? For the low-sodium eating plan, you  will follow these general guidelines:  Choose foods with a % Daily Value for sodium of less than 5% (as listed on the food label).   Use salt-free seasonings or herbs instead of table salt or sea salt.   Check with your health care provider or pharmacist before using salt substitutes.   Eat fresh foods.  Eat more vegetables and fruits.  Limit canned vegetables. If you do use them, rinse them well to decrease the sodium.   Limit cheese to 1 oz (28 g) per day.   Eat lower-sodium products, often labeled as "lower sodium" or "no salt added."  Avoid foods that contain monosodium glutamate (MSG). MSG is sometimes added to [place] food and some canned foods.  Check food labels (Nutrition Facts labels) on foods to learn how much sodium is in one serving.  Eat more home-cooked food and less restaurant, buffet, and fast food.  When eating at a restaurant, ask that your food be prepared with less salt, or no salt if possible.  HOW DO I READ FOOD LABELS FOR SODIUM INFORMATION? The Nutrition Facts label lists the amount of sodium in one serving of the food. If you eat more than one serving, you must multiply the listed amount of sodium by the number of servings. Food labels may also identify foods as:  Sodium free--Less than 5 mg in a serving.  Very low sodium--35 mg or less in a serving.  Low sodium--140 mg or less in a serving.  Light in sodium--50% less  sodium in a serving. For example, if a food that usually has 300 mg of sodium is changed to become light in sodium, it will have 150 mg of sodium.  Reduced sodium--25% less sodium in a serving. For example, if a food that usually has 400 mg of sodium is changed to reduced sodium, it will have 300 mg of sodium. WHAT FOODS CAN I EAT? Grains Low-sodium cereals, including oats, puffed wheat and rice, and shredded wheat cereals. Low-sodium crackers. Unsalted rice and pasta. Lower-sodium bread.  Vegetables Frozen or fresh  vegetables. Low-sodium or reduced-sodium canned vegetables. Low-sodium or reduced-sodium tomato sauce and paste. Low-sodium or reduced-sodium tomato and vegetable juices.  Fruits Fresh, frozen, and canned fruit. Fruit juice.  Meat and Other Protein Products Low-sodium canned tuna and salmon. Fresh or frozen meat, poultry, seafood, and fish. Lamb. Unsalted nuts. Dried beans, peas, and lentils without added salt. Unsalted canned beans. Homemade soups without salt. Eggs.  Dairy Milk. Soy milk. Ricotta cheese. Low-sodium or reduced-sodium cheeses. Yogurt.  Condiments Fresh and dried herbs and spices. Salt-free seasonings. Onion and garlic powders. Low-sodium varieties of mustard and ketchup. Fresh or refrigerated horseradish. Lemon juice.  Fats and Oils Reduced-sodium salad dressings. Unsalted butter.  Other Unsalted popcorn and pretzels.  The items listed above may not be a complete list of recommended foods or beverages. Contact your dietitian for more options. WHAT FOODS ARE NOT RECOMMENDED? Grains Instant hot cereals. Bread stuffing, pancake, and biscuit mixes. Croutons. Seasoned rice or pasta mixes. Noodle soup cups. Boxed or frozen macaroni and cheese. Self-rising flour. Regular salted crackers. Vegetables Regular canned vegetables. Regular canned tomato sauce and paste. Regular tomato and vegetable juices. Frozen vegetables in sauces. Salted [place] fries. Olives. Angie Fava. Relishes. Sauerkraut. Salsa. Meat and Other Protein Products Salted, canned, smoked, spiced, or pickled meats, seafood, or fish. Bacon, ham, sausage, hot dogs, corned beef, chipped beef, and packaged luncheon meats. Salt pork. Jerky. Pickled herring. Anchovies, regular canned tuna, and sardines. Salted nuts. Dairy Processed cheese and cheese spreads. Cheese curds. Blue cheese and cottage cheese. Buttermilk.  Condiments Onion and garlic salt, seasoned salt, table salt, and sea salt. Canned and packaged  gravies. Worcestershire sauce. Tartar sauce. Barbecue sauce. Teriyaki sauce. Soy sauce, including reduced sodium. Steak sauce. Fish sauce. Oyster sauce. Cocktail sauce. Horseradish that you find on the shelf. Regular ketchup and mustard. Meat flavorings and tenderizers. Bouillon cubes. Hot sauce. Tabasco sauce. Marinades. Taco seasonings. Relishes. Fats and Oils Regular salad dressings. Salted butter. Margarine. Ghee. Bacon fat.  Other Potato and tortilla chips. Corn chips and puffs. Salted popcorn and pretzels. Canned or dried soups. Pizza. Frozen entrees and pot pies.  The items listed above may not be a complete list of foods and beverages to avoid. Contact your dietitian for more information.   This information is not intended to replace advice given to you by your health care provider. Make sure you discuss any questions you have with your health care provider.   Document Released: 07/19/2001 Document Revised: 02/17/2014 Document Reviewed: 12/01/2012 Elsevier Interactive Patient Education [employer].

## 2015-03-19 NOTE — Telephone Encounter (Signed)
Let message for patient re 3/31 appointments at 10:15 am. Schedule mailed.

## 2015-03-19 NOTE — Progress Notes (Signed)
Cardiology Office Note   Date:  03/19/2015   ID:  Diamond Boyd, Diamond Boyd [birth year], MRN [id number]  PCP:  [name], MD  Cardiologist:  Dr Meredeth Ide, PA-C   Chief Complaint  Patient presents with  . Follow-up    no chest pain, has shortness of breath, occassional edema, no pain or cramping in legs, has dizziness    History of Present Illness: Diamond Boyd is a 75 y.o. female with a history of lung CA w/ resection, Hodgkin's lymphoma, malignant B-cell follicular lymphoma, neuropathy, hypothyroidism.   She was admitted to [facility] 12/11-12/01/2015 with acute CHF. Echo demonstrated EF 40-45%. Trop 0.38, felt demand ischemia, [facility] MD saw, OP f/u OK.  Admitted 12/13-12/17/2016 w/ afib, RVR. CHADSVASC=5, minimal trop elev, cath w/ non-obs dz, anemia s/p transfusion, Cr elevated but improved.  Seen in the office 01/04 by Dr Claiborne Billings. Eliquis had been stopped 2nd rectal bleeding. She was in SR, volume stable, NICM ?2nd Rituxan. Started on lisinopril. BMET 1 week.   Seen by GI on 02/21/2015 and flex sig planned to see if hemorrhoids were causing her anemia, if so she could eventually go back on Eliquis. Per the patient's report, no source of bleeding was found on flexible sigmoidoscopy. Therefore, she is still off Eliquis. She needs to have a tooth pulled soon, otherwise no upcoming procedures. She is still taking the Rituxan, but only every other month.  Diamond Boyd presents for medication management of her CHF and PAF.  She feels she is doing well. She has chronic dyspnea on exertion, but this has not changed recently. Since being started on Lasix, she has noticed a decrease in her lower extremity edema. She is also wearing compression stockings which helped. She denies orthopnea and PND. She feels that she is doing well with a low sodium diet. However, she is not weighing herself every day. She has scales. Additionally, she has developed a significant cough with the  lisinopril. The cough bothers her a great deal during the day and is waking her up at night as well. She has not had chest pain or palpitations. She can do a few steps of A bit, but admits that she does not do much and is deconditioned.   Past Medical History  Diagnosis Date  . Colon polyps   . Depression   . Hypothyroidism   . Osteoarthritis     hands  . Peripheral vascular disease ([place])   . Degenerative disk disease     Thoracic spine  . 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 Cancer ([place])     skin cancer- basal cell on arm / colon 2011  . Lymphoma ([place])   . Colon cancer ([place])     2011, s/p surgery, in remission  . Neuropathy due to chemotherapeutic drug ([place]) 06/19/2014  . hodgkins lymphoma   . Heart murmur   . GERD (gastroesophageal reflux disease)   . Anemia     hx blood tx  . Pneumonia   . 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 ([place]) 12/11/2014    malignant B cell lymphoma- being treated actively  . Hypertension   . CHF (congestive heart failure) [facility])     Past Surgical History  Procedure Laterality Date  . Cholecystectomy    . Splenectomy      lymphoma  . Breast biopsy  1996  . Plantar fascia release    . Ventral hernia repair  1998  . Tendon release  Right thumb   . Colectomy  8/11  . Vaginal hysterectomy    . Bone marrow biopsy  05/27/13  . Cataract extraction, bilateral  2016  . Port-a-cath insertion    . Colonoscopy w/ biopsies    . Dg biopsy lung    . Video bronchoscopy with endobronchial ultrasound N/A 10/17/2014    Procedure: VIDEO BRONCHOSCOPY WITH ENDOBRONCHIAL ULTRASOUND;  Surgeon: Javier Glazier, MD;  Location: [place];  Service: Thoracic;  Laterality: N/A;  . Video bronchoscopy with endobronchial ultrasound N/A 11/01/2014    Procedure: VIDEO BRONCHOSCOPY WITH ENDOBRONCHIAL ULTRASOUND;  Surgeon: Grace Isaac, MD;  Location: [place];  Service: Thoracic;  Laterality: N/A;  . Mediastinoscopy N/A 11/01/2014    Procedure:  MEDIASTINOSCOPY;  Surgeon: Grace Isaac, MD;  Location: [place];  Service: Thoracic;  Laterality: N/A;  . Lymph node biopsy N/A 11/01/2014    Procedure: MEDIASTINAL LYMPH NODE BIOPSY;  Surgeon: Grace Isaac, MD;  Location: [place];  Service: Thoracic;  Laterality: N/A;  . Cardiac catheterization N/A 01/25/2015    Procedure: Left Heart Cath and Coronary Angiography;  Surgeon: Peter M Martinique, MD;  Location: [place] [facility];  Service: Cardiovascular;  Laterality: N/A;    Current Outpatient Prescriptions  Medication Sig Dispense Refill  . acetaminophen (TYLENOL) 650 MG CR tablet Take 650 mg by mouth every 8 (eight) hours as needed (For back pain.).    Marland Kitchen acyclovir (ZOVIRAX) 400 MG tablet Take 400 mg by mouth daily.    Marland Kitchen aspirin 81 MG tablet Take 81 mg by mouth daily.    . Calcium Carbonate-Vitamin D (CALCIUM 600+D) 600-400 MG-UNIT per tablet Take 1 tablet by mouth every evening.     . celecoxib (CELEBREX) 200 MG capsule Take 200 mg by mouth 2 (two) times daily.    . Cholecalciferol (VITAMIN D-3) 1000 UNITS CAPS Take 1,000 Units by mouth daily.    Marland Kitchen docusate sodium (COLACE) 100 MG capsule Take 100 mg by mouth at bedtime.    Marland Kitchen estradiol (ESTRACE) 1 MG tablet Take 1 mg by mouth daily.    Marland Kitchen FLUoxetine (PROZAC) 20 MG capsule Take 20 mg by mouth daily.    . fluticasone (FLONASE) 50 MCG/ACT nasal spray Place 2 sprays into both nostrils daily.    . furosemide (LASIX) 20 MG tablet Take 1 tablet (20 mg total) by mouth every other day. Take 1 tab by mouth daily for weight gain. 90 tablet 3  . gabapentin (NEURONTIN) 300 MG capsule Take 300 mg by mouth 2 (two) times daily.    Marland Kitchen levothyroxine (SYNTHROID, LEVOTHROID) 112 MCG tablet Take 112 mcg by mouth daily before breakfast.    . lidocaine-prilocaine (EMLA) cream Apply 1 application topically as needed (For port-a-cath.). Reported on 02/22/2015    . loratadine (CLARITIN) 10 MG tablet Take 10 mg by mouth daily.    . metoprolol (LOPRESSOR) 50 MG tablet  TAKE 1 TABLET (50 MG TOTAL) BY MOUTH 2 (TWO) TIMES DAILY. 60 tablet 11  . Omega-3 350 MG CAPS Take 1 capsule by mouth daily. Reported on 02/22/2015    . omeprazole (PRILOSEC) 40 MG capsule Take 40 mg by mouth daily.    . ondansetron (ZOFRAN) 8 MG tablet Take 8 mg by mouth every 6 (six) hours as needed for nausea or vomiting. Reported on 03/05/2015    . polyethylene glycol (MIRALAX / GLYCOLAX) packet Take 17 g by mouth at bedtime.    . prochlorperazine (COMPAZINE) 10 MG tablet Take 10 mg by  mouth every 6 (six) hours as needed for nausea or vomiting. Reported on 03/05/2015    . psyllium (METAMUCIL) 58.6 % packet Take 1-3 packets by mouth at bedtime.     . simvastatin (ZOCOR) 40 MG tablet Take 40 mg by mouth at bedtime.    Marland Kitchen losartan (COZAAR) 25 MG tablet Take 1 tablet (25 mg total) by mouth daily. 30 tablet 3  . vitamin E (VITAMIN E) 400 UNIT capsule Take 400 Units by mouth daily. Reported on 03/05/2015     No current facility-administered medications for this visit.    Allergies:   Achromycin; Allopurinol; Astelin; Cephalexin; Codeine; Lisinopril; Meloxicam; Minocycline; Nabumetone; Nyquil; Penicillins; Sulfa antibiotics; Zolpidem tartrate; Buspar; and Ciprofloxacin    Social History:  The patient  reports that she has never smoked. She has never used smokeless tobacco. She reports that she does not drink alcohol or use illicit drugs.   Family History:  The patient's family history includes Alcohol abuse in her sister; Asthma in her daughter; COPD in her daughter; Clotting disorder in her brother; Coronary artery disease in her mother; Diabetes in her brother and mother; Fibromyalgia in her daughter; Rheum arthritis in her brother. There is no history of Colon cancer, Colon polyps, Stomach cancer, Rectal cancer, Ulcerative colitis, or Crohn's disease.    ROS:  Please see the history of present illness. All other systems are reviewed and negative.    PHYSICAL EXAM: VS:  BP 124/70 mmHg  Pulse 68   Ht _0  (1.676 m)  Wt 235 lb (106.595 kg)  BMI 37.95 kg/m2  LMP 02/10/1970 , BMI Body mass index is 37.95 kg/(m^2). GEN: Well nourished, well developed, female in no acute distress HEENT: normal for age; she has eyebrows and some eyelashes, most of her hair is, but it is starting to come back Neck: no JVD, no carotid bruit, no masses Cardiac: RRR; soft murmur, no rubs, or gallops Respiratory:  clear to auscultation bilaterally, normal work of breathing GI: soft, nontender, nondistended, + BS MS: no deformity or atrophy; trace edema; distal pulses are 2+ in all 4 extremities  Skin: warm and dry, no rash Neuro:  Strength and sensation are intact Psych: euthymic mood, full affect   EKG:  EKG is not ordered today.  Recent Labs: 01/23/2015: B Natriuretic Peptide 622.7*; Magnesium 2.0; TSH 7.240* 03/12/2015: ALT 22; BUN 11.2; Creatinine 0.9; HGB 11.7; Platelets 246; Potassium 4.0; Sodium 142    Lipid Panel    Component Value Date/Time   CHOL 163 01/25/2015 0442   TRIG 121 01/25/2015 0442   HDL 44 01/25/2015 0442   CHOLHDL 3.7 01/25/2015 0442   VLDL 24 01/25/2015 0442   LDLCALC 95 01/25/2015 0442   LDLDIRECT 133.0 09/17/2012 1323     Wt Readings from Last 3 Encounters:  03/19/15 235 lb (106.595 kg)  03/05/15 236 lb 12.8 oz (107.412 kg)  02/22/15 234 lb (106.142 kg)     Other studies Reviewed: Additional studies/ records that were reviewed today include: Office Notes, hospital records and other testing.  ASSESSMENT AND PLAN:  1.  Chronic systolic CHF: She is encouraged to start daily weights. Her weight has not changed any in the last 4 weeks or so. She is encouraged to stick to a low sodium diet. She wonders if she has to take the Lasix every day, and I advised her that it was okay to take it every other day and then take an additional tablet daily for edema. The patient is agreeable to this.  She is encouraged to increase her activity by walking more when she does things like  go to the store or walking outside. Hopefully she will be able to do this.  2. Nonischemic cardiomyopathy: She is on a beta blocker and tolerating this well. She is on an ACE inhibitor and is not tolerating this well. We will list her as being intolerant to ace inhibitors and start her on an ARB. To make sure her renal function will tolerate these medications, she will have a BMET today.  3. PAF: By exam, she is in sinus rhythm today. Continue metoprolol.  4. Anticoagulation: The Eliquis was held because of recurrent rectal bleeding. Further patient, flexible sigmoidoscopy did not detect a source. Therefore, she is to continue to remain off the Eliquis. CHADSVASC=6  5. Hypertension: Her blood pressure is under good control today, hopefully it will stay that way with the medication changes   Current medicines are reviewed at length with the patient today.  The patient does not have concerns regarding medicines.  The following changes have been made:  Alter Lasix dosing slightly, change lisinopril to losartan  Labs/ tests ordered today include:   Orders Placed This Encounter  Procedures  . Basic Metabolic Panel (BMET)  . Echocardiogram     Disposition:   FU with Dr. Claiborne Billings in 3 months with an echocardiogram  Signed, Lenoard Aden  03/19/2015 11:59 AM    [facility] [address], [place], [place]  [address] Phone: [phone]; Fax: [phone]

## 2015-04-02 ENCOUNTER — Other Ambulatory Visit: Payer: Self-pay

## 2015-04-02 ENCOUNTER — Ambulatory Visit (HOSPITAL_COMMUNITY): Payer: Medicare Other | Attending: Cardiovascular Disease

## 2015-04-02 ENCOUNTER — Telehealth: Payer: Self-pay | Admitting: Physician Assistant

## 2015-04-02 DIAGNOSIS — I340 Nonrheumatic mitral (valve) insufficiency: Secondary | ICD-10-CM | POA: Insufficient documentation

## 2015-04-02 DIAGNOSIS — Z6837 Body mass index (BMI) 37.0-37.9, adult: Secondary | ICD-10-CM | POA: Insufficient documentation

## 2015-04-02 DIAGNOSIS — E669 Obesity, unspecified: Secondary | ICD-10-CM | POA: Diagnosis not present

## 2015-04-02 DIAGNOSIS — I5022 Chronic systolic (congestive) heart failure: Secondary | ICD-10-CM | POA: Insufficient documentation

## 2015-04-02 DIAGNOSIS — I110 Hypertensive heart disease with heart failure: Secondary | ICD-10-CM | POA: Insufficient documentation

## 2015-04-02 DIAGNOSIS — E039 Hypothyroidism, unspecified: Secondary | ICD-10-CM | POA: Insufficient documentation

## 2015-04-02 NOTE — Telephone Encounter (Signed)
Called was dropped Called back and spoke with PharmD to verify Lasix order.  Pt directions were confirmed, no questions at this time.

## 2015-04-02 NOTE — Telephone Encounter (Signed)
Returning your call. °

## 2015-04-02 NOTE — Telephone Encounter (Signed)
Diamond Boyd needs the correct directions for the pt's Furosemide 20 mg prescription.

## 2015-04-04 ENCOUNTER — Encounter: Payer: Self-pay | Admitting: Family Medicine

## 2015-04-04 ENCOUNTER — Telehealth: Payer: Self-pay | Admitting: *Deleted

## 2015-04-04 ENCOUNTER — Ambulatory Visit (INDEPENDENT_AMBULATORY_CARE_PROVIDER_SITE_OTHER): Payer: Medicare Other | Admitting: Family Medicine

## 2015-04-04 VITALS — BP 110/80 | HR 60 | Temp 98.6°F | Wt 236.8 lb

## 2015-04-04 DIAGNOSIS — I10 Essential (primary) hypertension: Secondary | ICD-10-CM | POA: Diagnosis not present

## 2015-04-04 DIAGNOSIS — I2510 Atherosclerotic heart disease of native coronary artery without angina pectoris: Secondary | ICD-10-CM | POA: Diagnosis not present

## 2015-04-04 DIAGNOSIS — Z79899 Other long term (current) drug therapy: Secondary | ICD-10-CM

## 2015-04-04 DIAGNOSIS — I480 Paroxysmal atrial fibrillation: Secondary | ICD-10-CM

## 2015-04-04 DIAGNOSIS — C8228 Follicular lymphoma grade III, unspecified, lymph nodes of multiple sites: Secondary | ICD-10-CM

## 2015-04-04 DIAGNOSIS — I5032 Chronic diastolic (congestive) heart failure: Secondary | ICD-10-CM | POA: Diagnosis not present

## 2015-04-04 DIAGNOSIS — D630 Anemia in neoplastic disease: Secondary | ICD-10-CM

## 2015-04-04 DIAGNOSIS — E039 Hypothyroidism, unspecified: Secondary | ICD-10-CM

## 2015-04-04 LAB — T4, FREE: Free T4: 1 ng/dL (ref 0.60–1.60)

## 2015-04-04 MED ORDER — FEXOFENADINE HCL 180 MG PO TABS
180.0000 mg | ORAL_TABLET | Freq: Every day | ORAL | Status: DC
Start: 2015-04-04 — End: 2015-04-16

## 2015-04-04 MED ORDER — METOPROLOL TARTRATE 50 MG PO TABS: ORAL_TABLET | ORAL | Status: DC

## 2015-04-04 MED ORDER — FLUTICASONE PROPIONATE 50 MCG/ACT NA SUSP: 2.0000 | Freq: Every day | NASAL | Status: DC

## 2015-04-04 MED ORDER — SIMVASTATIN 40 MG PO TABS: 40.0000 mg | ORAL_TABLET | Freq: Every day | ORAL | Status: DC

## 2015-04-04 MED ORDER — FLUOXETINE HCL 20 MG PO CAPS: 20.0000 mg | ORAL_CAPSULE | Freq: Every day | ORAL | Status: DC

## 2015-04-04 MED ORDER — ESTRADIOL 1 MG PO TABS: 1.0000 mg | ORAL_TABLET | Freq: Every day | ORAL | Status: DC

## 2015-04-04 MED ORDER — ACYCLOVIR 400 MG PO TABS: 400.0000 mg | ORAL_TABLET | Freq: Every day | ORAL | Status: DC

## 2015-04-04 MED ORDER — OMEPRAZOLE 40 MG PO CPDR: 40.0000 mg | DELAYED_RELEASE_CAPSULE | Freq: Every day | ORAL | Status: DC

## 2015-04-04 MED ORDER — CELECOXIB 200 MG PO CAPS: 200.0000 mg | ORAL_CAPSULE | Freq: Every day | ORAL | Status: DC | PRN

## 2015-04-04 MED ORDER — GABAPENTIN 300 MG PO CAPS: 300.0000 mg | ORAL_CAPSULE | Freq: Two times a day (BID) | ORAL | Status: DC

## 2015-04-04 NOTE — Telephone Encounter (Signed)
LM with lab results.  Requested she go to the lab to get K+ rechecked.  Order placed in epic.

## 2015-04-04 NOTE — Progress Notes (Signed)
Pre visit review using our clinic review tool, if applicable. No additional management support is needed unless otherwise documented below in the visit note. 

## 2015-04-04 NOTE — Telephone Encounter (Signed)
-----   Message from Lonn Georgia, PA-C sent at 03/30/2015  5:40 PM EST ----- Her K+ was on the high side. I started an ARB. Please get a BMET when she comes to see Dr C on the 20th

## 2015-04-04 NOTE — Progress Notes (Signed)
Subjective:    Patient ID: Diamond Boyd, female    DOB: [birth year], 75 y.o.   MRN: [id number]  HPI Here for f/u of chronic health problems  Wt is up 1 lb Obese with bmi of 30  Undergoing rituximab tx for lymphoma-not a lot of side effects except ongoing neuropathy  Last PET/CT was promising  Watching anemia of chronic dz  Lab Results  Component Value Date   WBC 5.1 03/12/2015   HGB 11.7 03/12/2015   HCT 35.5 03/12/2015   MCV 97.7 03/12/2015   PLT 246 03/12/2015    CHF is stable On lasix  Non isch cardiomyop  PAF- now in sinus rhythm on beta blocker  bp is stable today  No cp or palpitations or headaches or edema  No side effects to medicines  BP Readings from Last 3 Encounters:  04/04/15 110/80  03/19/15 124/70  03/12/15 115/45    Changed from ace to arb for tolerance issues    Chemistry      Component Value Date/Time   NA 140 03/19/2015 1207   NA 142 03/12/2015 1024   K 4.5 03/19/2015 1207   K 4.0 03/12/2015 1024   CL 104 03/19/2015 1207   CL 105 07/14/2012 0939   CO2 28 03/19/2015 1207   CO2 26 03/12/2015 1024   BUN 13 03/19/2015 1207   BUN 11.2 03/12/2015 1024   CREATININE 0.83 03/19/2015 1207   CREATININE 0.9 03/12/2015 1024   CREATININE 1.01* 01/27/2015 0625      Component Value Date/Time   CALCIUM 9.6 03/19/2015 1207   CALCIUM 9.6 03/12/2015 1024   CALCIUM 9.3 08/09/2009 0000   ALKPHOS 167* 03/12/2015 1024   ALKPHOS 160* 01/24/2015 0855   AST 26 03/12/2015 1024   AST 32 01/24/2015 0855   ALT 22 03/12/2015 1024   ALT 38 01/24/2015 0855   BILITOT 0.43 03/12/2015 1024   BILITOT 1.0 01/24/2015 0855       Thyroid due for re check  Lab Results  Component Value Date   TSH 7.240* 01/23/2015    Ft4 was nl   Lab Results  Component Value Date   CHOL 163 01/25/2015   HDL 44 01/25/2015   LDLCALC 95 01/25/2015   LDLDIRECT 133.0 09/17/2012   TRIG 121 01/25/2015   CHOLHDL 3.7 01/25/2015     Has had 2 teeth pulled a week ago  Has a yellow  area near site  - and wanted to have that checked   Patient Active Problem List   Diagnosis Date Noted  . Benign essential HTN   . PAF (paroxysmal atrial fibrillation) ([place])   . Internal hemorrhoids 02/21/2015  . Rectal bleeding 02/06/2015  . 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 Thrombocytopenia ([place]) 01/29/2015  . AKI (acute kidney injury) ([place])   . Elevated troponin   . Atrial fibrillation with rapid ventricular response ([place]) 01/23/2015  . New onset a-fib ([place]) 01/23/2015  . Hypertension 01/23/2015  . CHF (congestive heart failure) ([place]) 01/21/2015  . Chronic systolic CHF (congestive heart failure), NYHA class 2 ([place]) 01/11/2015  . Fever, unspecified 01/10/2015  . Immunocompromised status associated with infection ([place]) 01/08/2015  . 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 Preventive measure 11/14/2014  . Parotid mass 11/14/2014  . Chronic lower back pain 09/28/2014  . Mediastinal lymphadenopathy 09/27/2014  . Neuropathy due to chemotherapeutic drug ([place])  06/19/2014  . Peripheral neuropathy due to chemotherapy ([place]) 04/03/2014  . Trochanteric bursitis of right hip 04/03/2014  . Anemia in chronic illness 02/21/2014  . Elevated liver enzymes 02/21/2014  . Urinary tract infection 11/18/2013  . Candidal intertrigo 09/23/2013  . Atrophic vaginitis 09/23/2013  . Drug-induced neutropenia ([place]) 09/01/2013  . History of colon cancer 06/17/2013  . History of hodgkin's lymphoma 05/20/2013  . Dysuria 05/20/2013  . Conjunctivitis, acute 04/27/2013  . Cold sore 04/27/2013  . Left temporal headache 03/11/2013  . Hyperglycemia 03/11/2013  . Acute maxillary sinusitis 02/09/2013  . Other malaise and fatigue 01/04/2013  . Herpes zoster 11/22/2012  . Vaginal pain 11/22/2012  . Cystocele 09/22/2012  . Encounter for Medicare annual wellness exam 09/17/2012    . Left knee pain 09/10/2012  . Thoracic back pain 06/01/2012  . Skin lesion of face 05/17/2012  . Other screening mammogram 07/22/2011  . Routine gynecological examination 07/22/2011  . Colon cancer screening 07/22/2011  . History of anemia 05/06/2011  . 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 Hypothyroid 02/24/2011  . Varicose veins 02/24/2011  . Elevated blood pressure 11/20/2010  . Obesity 11/20/2010  . 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 Other chronic sinusitis 07/03/2009  . THROAT PAIN, CHRONIC 07/03/2009  . GERD 12/20/2008  . Anxiety state, unspecified 08/22/2008  . MENOPAUSAL SYNDROME 07/13/2007  . HYPERCHOLESTEROLEMIA, PURE 07/08/2006  . Allergic rhinitis 07/08/2006  . OVERACTIVE BLADDER 07/08/2006  . 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 Depression 06/25/2006  . PERIPHERAL VASCULAR DISEASE 06/25/2006  . OSTEOARTHRITIS 06/25/2006  . LEG EDEMA 06/25/2006  . SKIN CANCER, HX OF 06/25/2006  . COLONIC POLYPS, HX OF 06/25/2006   Past Medical History  Diagnosis Date  . Colon polyps   . Depression   . Hypothyroidism   . Osteoarthritis     hands  . Peripheral vascular disease ([place])   . Degenerative disk disease     Thoracic spine  . 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 Cancer ([place])     skin cancer- basal cell on arm / colon 2011  . Lymphoma ([place])   . Colon cancer ([place])     2011, s/p surgery, in remission  . Neuropathy due to chemotherapeutic drug ([place]) 06/19/2014  . hodgkins lymphoma   . Heart murmur   . GERD (gastroesophageal reflux disease)   . Anemia     hx blood tx  . Pneumonia   . 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 Benign essential HTN   . Chronic systolic CHF (congestive heart failure), NYHA class 2 ([place]) 01/2015  . NICM (nonischemic cardiomyopathy) ([place]) 01/2015  . PAF (paroxysmal atrial fibrillation) [facility])    Past Surgical History  Procedure  Laterality Date  . Cholecystectomy    . Splenectomy      lymphoma  . Breast biopsy  1996  . Plantar fascia release    . Ventral hernia repair  1998  . Tendon release      Right thumb   . Colectomy  8/11  . Vaginal hysterectomy    . Bone marrow biopsy  05/27/13  . Cataract extraction, bilateral  2016  . Port-a-cath insertion    . Colonoscopy w/ biopsies    . Dg biopsy lung    . Video bronchoscopy with endobronchial ultrasound N/A 10/17/2014    Procedure: VIDEO BRONCHOSCOPY WITH ENDOBRONCHIAL ULTRASOUND;  Surgeon: Javier Glazier, MD;  Location:  [facility] OR;  Service: Thoracic;  Laterality: N/A;  . Video bronchoscopy with endobronchial ultrasound N/A 11/01/2014    Procedure: VIDEO BRONCHOSCOPY WITH ENDOBRONCHIAL ULTRASOUND;  Surgeon: Grace Isaac, MD;  Location: [facility];  Service: Thoracic;  Laterality: N/A;  . Mediastinoscopy N/A 11/01/2014    Procedure: MEDIASTINOSCOPY;  Surgeon: Grace Isaac, MD;  Location: [facility];  Service: Thoracic;  Laterality: N/A;  . Lymph node biopsy N/A 11/01/2014    Procedure: MEDIASTINAL LYMPH NODE BIOPSY;  Surgeon: Grace Isaac, MD;  Location: [facility] [place];  Service: Thoracic;  Laterality: N/A;  . Cardiac catheterization N/A 01/25/2015    Procedure: Left Heart Cath and Coronary Angiography;  Surgeon: Peter M Martinique, MD;  Location: [facility];  Service: Cardiovascular;  Laterality: N/A;   Social History  Substance Use Topics  . Smoking status: Never Smoker   . Smokeless tobacco: Never Used     Comment: Second-hand exposure through father.  . Alcohol Use: No   Family History  Problem Relation Age of Onset  . Coronary artery disease Mother   . Diabetes Mother   . Diabetes Brother   . Fibromyalgia Daughter     chronic pain   . COPD Daughter   . Colon cancer Neg Hx   . Colon polyps Neg Hx   . Stomach cancer Neg Hx   . Rectal cancer Neg Hx   . Ulcerative colitis Neg Hx   . Crohn's disease Neg Hx   . Asthma Daughter   . Rheum arthritis Brother    . Clotting disorder Brother   . Alcohol abuse Sister    Allergies  Allergen Reactions  . Achromycin [Tetracycline Hcl] Other (See Comments)    Pt does not remember reaction  . Allopurinol Other (See Comments)    REACTION: Unsure of reaction happene years ago  . Astelin [Azelastine Hcl] Other (See Comments)    Reaction unknown  . Cephalexin Other (See Comments)    REACTION: unsure of reaction happened yrs ago.  . Codeine Other (See Comments)    REACTION: abd. pain  . Lisinopril Cough  . Meloxicam Other (See Comments)    REACTION: GI symptoms  . Minocycline Other (See Comments)    Abdominal pain  . Nabumetone Other (See Comments)    REACTION: reaction not known  . Nyquil [Pseudoeph-Doxylamine-Dm-Apap] Hives  . Penicillins Other (See Comments)    Reaction unknown Has patient had a PCN reaction causing immediate rash, facial/tongue/throat swelling, SOB or lightheadedness with hypotension: unsure reaction unknown Has patient had a PCN reaction causing severe rash involving mucus membranes or skin necrosis: unsure reaction unknown Has patient had a PCN reaction that required hospitalization no Has patient had a PCN reaction occurring within the last 10 years: no If all of the above answers are "NO", then may proceed with Cephalosporin use.  . Sulfa Antibiotics Other (See Comments)    Gi side eff   . Zolpidem Tartrate Other (See Comments)    REACTION: feels too drugged  . Buspar [Buspirone Hcl] Other (See Comments)    Dizziness, and not as effective for anxiety  . Ciprofloxacin Rash   Current Outpatient Prescriptions on File Prior to Visit  Medication Sig Dispense Refill  . acetaminophen (TYLENOL) 650 MG CR tablet Take 650 mg by mouth every 8 (eight) hours as needed (For back pain.).    Marland Kitchen aspirin 81 MG tablet Take 81 mg by mouth daily.    . Calcium Carbonate-Vitamin D (CALCIUM 600+D) 600-400 MG-UNIT per tablet  Take 1 tablet by mouth every evening.     . Cholecalciferol (VITAMIN  D-3) 1000 UNITS CAPS Take 1,000 Units by mouth daily.    Marland Kitchen docusate sodium (COLACE) 100 MG capsule Take 100 mg by mouth at bedtime.    . furosemide (LASIX) 20 MG tablet Take 1 tablet (20 mg total) by mouth every other day. Take 1 tab by mouth daily for weight gain. 90 tablet 3  . levothyroxine (SYNTHROID, LEVOTHROID) 112 MCG tablet Take 112 mcg by mouth daily before breakfast.    . lidocaine-prilocaine (EMLA) cream Apply 1 application topically as needed (For port-a-cath.). Reported on 02/22/2015    . loratadine (CLARITIN) 10 MG tablet Take 10 mg by mouth daily.    Marland Kitchen losartan (COZAAR) 25 MG tablet Take 1 tablet (25 mg total) by mouth daily. 30 tablet 3  . Omega-3 350 MG CAPS Take 1 capsule by mouth daily. Reported on 02/22/2015    . ondansetron (ZOFRAN) 8 MG tablet Take 8 mg by mouth every 6 (six) hours as needed for nausea or vomiting. Reported on 03/05/2015    . polyethylene glycol (MIRALAX / GLYCOLAX) packet Take 17 g by mouth at bedtime.    . prochlorperazine (COMPAZINE) 10 MG tablet Take 10 mg by mouth every 6 (six) hours as needed for nausea or vomiting. Reported on 03/05/2015    . psyllium (METAMUCIL) 58.6 % packet Take 1-3 packets by mouth at bedtime.     . vitamin E (VITAMIN E) 400 UNIT capsule Take 400 Units by mouth daily. Reported on 03/05/2015     No current facility-administered medications on file prior to visit.     Review of Systems Review of Systems  Constitutional: Negative for fever, appetite change,  and unexpected weight change.  Eyes: Negative for pain and visual disturbance.  ENT pos for mouth pain  Respiratory: Negative for cough and shortness of breath.   Cardiovascular: Negative for cp or palpitations    Gastrointestinal: Negative for nausea, diarrhea and constipation.  Genitourinary: Negative for urgency and frequency.  Skin: Negative for pallor or rash   Neurological: Negative for weakness, light-headedness, numbness and headaches.  Hematological: Negative for  adenopathy. Does bruise/bleed easily.  Psychiatric/Behavioral: Negative for dysphoric mood. The patient is not nervous/anxious.         Objective:   Physical Exam  Constitutional: She appears well-developed and well-nourished. No distress.  obese and well appearing   HENT:  Head: Normocephalic and atraumatic.  Mouth/Throat: Oropharynx is clear and moist.  Gum site from prior tooth ext appears normal w/o redness or swelling   Eyes: Conjunctivae and EOM are normal. Pupils are equal, round, and reactive to light.  Neck: Normal range of motion. Neck supple. No JVD present. Carotid bruit is not present. No thyromegaly present.  Cardiovascular: Normal rate, regular rhythm, normal heart sounds and intact distal pulses.  Exam reveals no gallop.   Pulmonary/Chest: Effort normal and breath sounds normal. No respiratory distress. She has no wheezes. She has no rales.  No crackles  Abdominal: Soft. Bowel sounds are normal. She exhibits no distension, no abdominal bruit and no mass. There is no tenderness.  Musculoskeletal: She exhibits no edema or tenderness.  Lymphadenopathy:    She has no cervical adenopathy.  Neurological: She is alert. She has normal reflexes.  Skin: Skin is warm and dry. No rash noted.  Hair is starting to grow back   Psychiatric: She has a normal mood and affect.  Assessment & Plan:   Problem List Items Addressed This Visit      Cardiovascular and Mediastinum   Benign essential HTN - Primary    bp in fair control at this time  BP Readings from Last 1 Encounters:  04/04/15 110/80   No changes needed Disc lifstyle change with low sodium diet and exercise  Rev last bmp from January Continue cardiology f/u      Relevant Medications   metoprolol (LOPRESSOR) 50 MG tablet   simvastatin (ZOCOR) 40 MG tablet   CHF (congestive heart failure) (HCC)    Improved and stable currently with lasix Rev last labs       Relevant Medications   metoprolol  (LOPRESSOR) 50 MG tablet   simvastatin (ZOCOR) 40 MG tablet   PAF (paroxysmal atrial fibrillation) (HCC)    Stable rate with beta blocker      Relevant Medications   metoprolol (LOPRESSOR) 50 MG tablet   simvastatin (ZOCOR) 40 MG tablet     Endocrine   Hypothyroid    Lab today  Do not want to over supplement in light of hx of a fib  Some fatigue       Relevant Medications   metoprolol (LOPRESSOR) 50 MG tablet   Other Relevant Orders   TSH (Completed)   T4, Free (Completed)     Immune and Lymphatic   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

## 2015-04-04 NOTE — Patient Instructions (Signed)
Ask your oncologist if it is safe to get the prevnar vaccine now  Don't forget to schedule your mammogram  Continue calcium and vitamin D  Thyroid tests today  Refills today  Make an appointment with the nurse for your medicare wellness visit  Follow up with me in August for annual examination with lab prior

## 2015-04-05 LAB — TSH: TSH: 0.54 u[IU]/mL (ref 0.35–4.50)

## 2015-04-05 MED ORDER — LEVOTHYROXINE SODIUM 112 MCG PO TABS: 112.0000 ug | ORAL_TABLET | Freq: Every day | ORAL | Status: DC

## 2015-04-05 NOTE — Assessment & Plan Note (Signed)
Doing well and stabilizing with current treatment She will ask her oncologist if it is safe to get prevnar vaccine

## 2015-04-05 NOTE — Assessment & Plan Note (Signed)
Stable rate with beta blocker

## 2015-04-05 NOTE — Assessment & Plan Note (Signed)
Last cbc improved No recent transfusions  Followed by oncology

## 2015-04-05 NOTE — Assessment & Plan Note (Signed)
Improved and stable currently with lasix Rev last labs

## 2015-04-05 NOTE — Assessment & Plan Note (Signed)
bp in fair control at this time  BP Readings from Last 1 Encounters:  04/04/15 110/80   No changes needed Disc lifstyle change with low sodium diet and exercise  Rev last bmp from January Continue cardiology f/u

## 2015-04-05 NOTE — Assessment & Plan Note (Signed)
Lab today  Do not want to over supplement in light of hx of a fib  Some fatigue

## 2015-04-06 ENCOUNTER — Encounter: Payer: Self-pay | Admitting: *Deleted

## 2015-04-09 DIAGNOSIS — Z79899 Other long term (current) drug therapy: Secondary | ICD-10-CM | POA: Diagnosis not present

## 2015-04-09 NOTE — Telephone Encounter (Signed)
Spoke with Dr Claiborne Billings regarding anticoagulation.   Per GI note 02/21/2015, "Bleeding keeps recurring when Eliquis is restarted, but resolves off of Eliquis." Nothing significant was found on flex sig, per pt.  Therefore, at this time, the risks of bleeding outweigh the benefits. For now, she will stay off Eliquis and continue ASA 81 mg.  Diamond Boyd, Suanne Marker, PA-C

## 2015-04-10 LAB — BASIC METABOLIC PANEL
BUN: 13 mg/dL (ref 7–25)
CALCIUM: 9.3 mg/dL (ref 8.6–10.4)
CO2: 33 mmol/L — ABNORMAL HIGH (ref 20–31)
Chloride: 102 mmol/L (ref 98–110)
Creat: 0.95 mg/dL — ABNORMAL HIGH (ref 0.60–0.93)
GLUCOSE: 125 mg/dL — AB (ref 65–99)
Potassium: 4.2 mmol/L (ref 3.5–5.3)
SODIUM: 140 mmol/L (ref 135–146)

## 2015-04-10 NOTE — Progress Notes (Signed)
L/M with results.

## 2015-04-16 ENCOUNTER — Encounter: Payer: Self-pay | Admitting: Family Medicine

## 2015-04-16 ENCOUNTER — Ambulatory Visit (INDEPENDENT_AMBULATORY_CARE_PROVIDER_SITE_OTHER): Payer: Medicare Other | Admitting: Family Medicine

## 2015-04-16 ENCOUNTER — Ambulatory Visit: Payer: Medicare Other

## 2015-04-16 VITALS — BP 112/66 | HR 64 | Temp 97.7°F | Wt 237.2 lb

## 2015-04-16 DIAGNOSIS — I2510 Atherosclerotic heart disease of native coronary artery without angina pectoris: Secondary | ICD-10-CM

## 2015-04-16 DIAGNOSIS — J0100 Acute maxillary sinusitis, unspecified: Secondary | ICD-10-CM | POA: Diagnosis not present

## 2015-04-16 MED ORDER — FEXOFENADINE HCL 180 MG PO TABS: 180.0000 mg | ORAL_TABLET | Freq: Every day | ORAL | Status: DC

## 2015-04-16 MED ORDER — AZITHROMYCIN 250 MG PO TABS: ORAL_TABLET | ORAL | Status: DC

## 2015-04-16 NOTE — Progress Notes (Signed)
Subjective:    Patient ID: Diamond Boyd, female    DOB: October 22, 1940, 75 y.o.   MRN: [id number]  HPI Here for acute sinus symptoms   Over a week   Hx of chronic sinusitis  Face hurts - worse on the L than the right  Green mucous from nose Does not think she has had a cold No fever   No coughing   No otc meds   Takes her allergy medicine  Allegra  Uses netti pot /nasal saline   Patient Active Problem List   Diagnosis Date Noted  . Benign essential HTN   . PAF (paroxysmal atrial fibrillation) ([place])   . Internal hemorrhoids 02/21/2015  . Rectal bleeding 02/06/2015  . 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 Thrombocytopenia ([place]) 01/29/2015  . AKI (acute kidney injury) ([place])   . Elevated troponin   . Atrial fibrillation with rapid ventricular response ([place]) 01/23/2015  . New onset a-fib ([place]) 01/23/2015  . Hypertension 01/23/2015  . CHF (congestive heart failure) ([place]) 01/21/2015  . Chronic systolic CHF (congestive heart failure), NYHA class 2 ([place]) 01/11/2015  . Fever, unspecified 01/10/2015  . Immunocompromised status associated with infection ([place]) 01/08/2015  . 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 Preventive measure 11/14/2014  . Parotid mass 11/14/2014  . Chronic lower back pain 09/28/2014  . Mediastinal lymphadenopathy 09/27/2014  . Neuropathy due to chemotherapeutic drug ([place]) 06/19/2014  . Peripheral neuropathy due to chemotherapy ([place]) 04/03/2014  . Trochanteric bursitis of right hip 04/03/2014  . Anemia in chronic illness 02/21/2014  . Elevated liver enzymes 02/21/2014  . Urinary tract infection 11/18/2013  . Candidal intertrigo 09/23/2013  . Atrophic vaginitis 09/23/2013  . Drug-induced neutropenia ([place]) 09/01/2013  . History of colon cancer 06/17/2013  . History of hodgkin's lymphoma 05/20/2013  . Dysuria 05/20/2013    . Conjunctivitis, acute 04/27/2013  . Cold sore 04/27/2013  . Left temporal headache 03/11/2013  . Hyperglycemia 03/11/2013  . Acute maxillary sinusitis 02/09/2013  . Other malaise and fatigue 01/04/2013  . Herpes zoster 11/22/2012  . Vaginal pain 11/22/2012  . Cystocele 09/22/2012  . Encounter for Medicare annual wellness exam 09/17/2012  . Left knee pain 09/10/2012  . Thoracic back pain 06/01/2012  . Skin lesion of face 05/17/2012  . Other screening mammogram 07/22/2011  . Routine gynecological examination 07/22/2011  . Colon cancer screening 07/22/2011  . History of anemia 05/06/2011  . 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 Hypothyroid 02/24/2011  . Varicose veins 02/24/2011  . Elevated blood pressure 11/20/2010  . Obesity 11/20/2010  . 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 Other chronic sinusitis 07/03/2009  . THROAT PAIN, CHRONIC 07/03/2009  . GERD 12/20/2008  . Anxiety state, unspecified 08/22/2008  . MENOPAUSAL SYNDROME 07/13/2007  . HYPERCHOLESTEROLEMIA, PURE 07/08/2006  . Allergic rhinitis 07/08/2006  . OVERACTIVE BLADDER 07/08/2006  . 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 Depression 06/25/2006  . PERIPHERAL VASCULAR DISEASE 06/25/2006  . OSTEOARTHRITIS 06/25/2006  . LEG EDEMA 06/25/2006  . SKIN CANCER, HX OF 06/25/2006  . COLONIC POLYPS, HX OF 06/25/2006   Past Medical History  Diagnosis Date  . Colon polyps   . Depression   . Hypothyroidism   . Osteoarthritis     hands  . Peripheral vascular disease ([place])   . Degenerative disk disease     Thoracic spine  . Other asplenic  status 04/01/2011  . Cancer ([place])     skin cancer- basal cell on arm / colon 2011  . Lymphoma ([place])   . Colon cancer ([place])     2011, s/p surgery, in remission  . Neuropathy due to chemotherapeutic drug ([place]) 06/19/2014  . hodgkins lymphoma   . Heart murmur   . GERD (gastroesophageal reflux disease)   . Anemia     hx blood tx  . Pneumonia   . 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 Benign essential HTN   . Chronic systolic CHF (congestive heart failure), NYHA class 2 ([place]) 01/2015  . NICM (nonischemic cardiomyopathy) ([place]) 01/2015  . PAF (paroxysmal atrial fibrillation) [facility])    Past Surgical History  Procedure Laterality Date  . Cholecystectomy    . Splenectomy      lymphoma  . Breast biopsy  1996  . Plantar fascia release    . Ventral hernia repair  1998  . Tendon release      Right thumb   . Colectomy  8/11  . Vaginal hysterectomy    . Bone marrow biopsy  05/27/13  . Cataract extraction, bilateral  2016  . Port-a-cath insertion    . Colonoscopy w/ biopsies    . Dg biopsy lung    . Video bronchoscopy with endobronchial ultrasound N/A 10/17/2014    Procedure: VIDEO BRONCHOSCOPY WITH ENDOBRONCHIAL ULTRASOUND;  Surgeon: Javier Glazier, MD;  Location: [facility];  Service: Thoracic;  Laterality: N/A;  . Video bronchoscopy with endobronchial ultrasound N/A 11/01/2014    Procedure: VIDEO BRONCHOSCOPY WITH ENDOBRONCHIAL ULTRASOUND;  Surgeon: Grace Isaac, MD;  Location: [facility];  Service: Thoracic;  Laterality: N/A;  . Mediastinoscopy N/A 11/01/2014    Procedure: MEDIASTINOSCOPY;  Surgeon: Grace Isaac, MD;  Location: [facility];  Service: Thoracic;  Laterality: N/A;  . Lymph node biopsy N/A 11/01/2014    Procedure: MEDIASTINAL LYMPH NODE BIOPSY;  Surgeon: Grace Isaac, MD;  Location: [facility];  Service: Thoracic;  Laterality: N/A;  . Cardiac catheterization N/A 01/25/2015    Procedure: Left Heart Cath and Coronary Angiography;  Surgeon: Peter M Martinique, MD;  Location: [facility];  Service: Cardiovascular;  Laterality: N/A;   Social History  Substance Use Topics  . Smoking status: Never Smoker   . Smokeless tobacco: Never Used     Comment: Second-hand exposure through father.  . Alcohol Use: No   Family History  Problem Relation Age of Onset    . Coronary artery disease Mother   . Diabetes Mother   . Diabetes Brother   . Fibromyalgia Daughter     chronic pain   . COPD Daughter   . Colon cancer Neg Hx   . Colon polyps Neg Hx   . Stomach cancer Neg Hx   . Rectal cancer Neg Hx   . Ulcerative colitis Neg Hx   . Crohn's disease Neg Hx   . Asthma Daughter   . Rheum arthritis Brother   . Clotting disorder Brother   . Alcohol abuse Sister    Allergies  Allergen Reactions  . Achromycin [Tetracycline Hcl] Other (See Comments)    Pt does not remember reaction  . Allopurinol Other (See Comments)    REACTION: Unsure of reaction happene years ago  . Astelin [Azelastine Hcl] Other (See Comments)    Reaction unknown  . Cephalexin Other (See Comments)  REACTION: unsure of reaction happened yrs ago.  . Codeine Other (See Comments)    REACTION: abd. pain  . Lisinopril Cough  . Meloxicam Other (See Comments)    REACTION: GI symptoms  . Minocycline Other (See Comments)    Abdominal pain  . Nabumetone Other (See Comments)    REACTION: reaction not known  . Nyquil [Pseudoeph-Doxylamine-Dm-Apap] Hives  . Penicillins Other (See Comments)    Reaction unknown Has patient had a PCN reaction causing immediate rash, facial/tongue/throat swelling, SOB or lightheadedness with hypotension: unsure reaction unknown Has patient had a PCN reaction causing severe rash involving mucus membranes or skin necrosis: unsure reaction unknown Has patient had a PCN reaction that required hospitalization no Has patient had a PCN reaction occurring within the last 10 years: no If all of the above answers are "NO", then may proceed with Cephalosporin use.  . Sulfa Antibiotics Other (See Comments)    Gi side eff   . Zolpidem Tartrate Other (See Comments)    REACTION: feels too drugged  . Buspar [Buspirone Hcl] Other (See Comments)    Dizziness, and not as effective for anxiety  . Ciprofloxacin Rash   Current Outpatient Prescriptions on File Prior to  Visit  Medication Sig Dispense Refill  . acetaminophen (TYLENOL) 650 MG CR tablet Take 650 mg by mouth every 8 (eight) hours as needed (For back pain.).    Marland Kitchen acyclovir (ZOVIRAX) 400 MG tablet Take 1 tablet (400 mg total) by mouth daily. 90 tablet 3  . aspirin 81 MG tablet Take 81 mg by mouth daily.    . Calcium Carbonate-Vitamin D (CALCIUM 600+D) 600-400 MG-UNIT per tablet Take 1 tablet by mouth every evening.     . celecoxib (CELEBREX) 200 MG capsule Take 1 capsule (200 mg total) by mouth daily as needed for moderate pain. 90 capsule 3  . Cholecalciferol (VITAMIN D-3) 1000 UNITS CAPS Take 1,000 Units by mouth daily.    Marland Kitchen docusate sodium (COLACE) 100 MG capsule Take 100 mg by mouth at bedtime.    Marland Kitchen estradiol (ESTRACE) 1 MG tablet Take 1 tablet (1 mg total) by mouth daily. 90 tablet 3  . fexofenadine (ALLEGRA) 180 MG tablet Take 180 mg by mouth daily.    Marland Kitchen FLUoxetine (PROZAC) 20 MG capsule Take 1 capsule (20 mg total) by mouth daily. 90 capsule 3  . fluticasone (FLONASE) 50 MCG/ACT nasal spray Place 2 sprays into both nostrils daily. 48 g 3  . furosemide (LASIX) 20 MG tablet Take 1 tablet (20 mg total) by mouth every other day. Take 1 tab by mouth daily for weight gain. 90 tablet 3  . gabapentin (NEURONTIN) 300 MG capsule Take 1 capsule (300 mg total) by mouth 2 (two) times daily. 180 capsule 3  . levothyroxine (SYNTHROID, LEVOTHROID) 112 MCG tablet Take 1 tablet (112 mcg total) by mouth daily before breakfast. 90 tablet 3  . lidocaine-prilocaine (EMLA) cream Apply 1 application topically as needed (For port-a-cath.). Reported on 02/22/2015    . losartan (COZAAR) 25 MG tablet Take 1 tablet (25 mg total) by mouth daily. 30 tablet 3  . metoprolol (LOPRESSOR) 50 MG tablet TAKE 1 TABLET (50 MG TOTAL) BY MOUTH 2 (TWO) TIMES DAILY. 180 tablet 3  . Omega-3 350 MG CAPS Take 1 capsule by mouth daily. Reported on 02/22/2015    . omeprazole (PRILOSEC) 40 MG capsule Take 1 capsule (40 mg total) by mouth daily.  90 capsule 3  . ondansetron (ZOFRAN) 8 MG tablet Take 8  mg by mouth every 6 (six) hours as needed for nausea or vomiting. Reported on 03/05/2015    . polyethylene glycol (MIRALAX / GLYCOLAX) packet Take 17 g by mouth at bedtime.    . prochlorperazine (COMPAZINE) 10 MG tablet Take 10 mg by mouth every 6 (six) hours as needed for nausea or vomiting. Reported on 03/05/2015    . psyllium (METAMUCIL) 58.6 % packet Take 1-3 packets by mouth at bedtime.     . simvastatin (ZOCOR) 40 MG tablet Take 1 tablet (40 mg total) by mouth at bedtime. 90 tablet 0  . vitamin E (VITAMIN E) 400 UNIT capsule Take 400 Units by mouth daily. Reported on 03/05/2015     No current facility-administered medications on file prior to visit.     Review of Systems  Constitutional: Positive for appetite change. Negative for fever and fatigue.  HENT: Positive for congestion, ear pain, postnasal drip, rhinorrhea, sinus pressure and sore throat. Negative for nosebleeds.   Eyes: Negative for pain, redness and itching.  Respiratory: Positive for cough. Negative for shortness of breath and wheezing.   Cardiovascular: Negative for chest pain.  Gastrointestinal: Negative for nausea, vomiting, abdominal pain and diarrhea.  Endocrine: Negative for polyuria.  Genitourinary: Negative for dysuria, urgency and frequency.  Musculoskeletal: Negative for myalgias and arthralgias.  Allergic/Immunologic: Negative for immunocompromised state.  Neurological: Positive for numbness and headaches. Negative for dizziness, tremors, syncope and weakness.       Has neuropathy in feet from recent chemo  Hematological: Negative for adenopathy. Does not bruise/bleed easily.  Psychiatric/Behavioral: Negative for dysphoric mood. The patient is not nervous/anxious.    Review of Systems         Objective:   Physical Exam  Constitutional: She appears well-developed and well-nourished. No distress.  obese and well appearing   HENT:  Head: Normocephalic  and atraumatic.  Right Ear: External ear normal.  Left Ear: External ear normal.  Mouth/Throat: Oropharynx is clear and moist. No oropharyngeal exudate.  Nares are injected and congested  L maxillary and frontal sinus tenderness  Post nasal drip -scant post throat injection   Eyes: Conjunctivae and EOM are normal. Pupils are equal, round, and reactive to light. Right eye exhibits no discharge. Left eye exhibits no discharge.  Neck: Normal range of motion. Neck supple.  Cardiovascular: Normal rate and regular rhythm.   Pulmonary/Chest: Effort normal and breath sounds normal. No respiratory distress. She has no wheezes. She has no rales.  Musculoskeletal: She exhibits tenderness.  Lymphadenopathy:    She has no cervical adenopathy.  Neurological: She is alert. No cranial nerve deficit.  Skin: Skin is warm and dry. No rash noted.  Psychiatric: She has a normal mood and affect.          Assessment & Plan:   Problem List Items Addressed This Visit      Respiratory   Acute maxillary sinusitis - Primary    Acute on chronic (in pt with hx of multiple med intol)- also allergies Cover with zpak (she can tol this and it usually works) Disc symptomatic care - see instructions on AVS  Update if not starting to improve in a week or if worsening  -esp if cough or fever           Relevant Medications   azithromycin (ZITHROMAX Z-PAK) 250 MG tablet   fexofenadine (ALLEGRA) 180 MG tablet

## 2015-04-16 NOTE — Assessment & Plan Note (Signed)
Acute on chronic (in pt with hx of multiple med intol)- also allergies Cover with zpak (she can tol this and it usually works) Disc symptomatic care - see instructions on AVS  Update if not starting to improve in a week or if worsening  -esp if cough or fever

## 2015-04-16 NOTE — Progress Notes (Signed)
Pre visit review using our clinic review tool, if applicable. No additional management support is needed unless otherwise documented below in the visit note. 

## 2015-04-16 NOTE — Patient Instructions (Signed)
Take zpak for your sinus infection  You can use warm compress on your face for the sinus pain  Drink lots of fluids  Breathe steam and use nasal saline for congestion   Update if not starting to improve in a week or if worsening

## 2015-04-23 ENCOUNTER — Ambulatory Visit (INDEPENDENT_AMBULATORY_CARE_PROVIDER_SITE_OTHER): Payer: Medicare Other

## 2015-04-23 ENCOUNTER — Other Ambulatory Visit: Payer: Self-pay

## 2015-04-23 VITALS — BP 100/64 | HR 60 | Temp 97.9°F | Ht 66.0 in | Wt 239.0 lb

## 2015-04-23 DIAGNOSIS — I10 Essential (primary) hypertension: Secondary | ICD-10-CM

## 2015-04-23 DIAGNOSIS — E669 Obesity, unspecified: Secondary | ICD-10-CM

## 2015-04-23 DIAGNOSIS — R7309 Other abnormal glucose: Secondary | ICD-10-CM

## 2015-04-23 DIAGNOSIS — N179 Acute kidney failure, unspecified: Secondary | ICD-10-CM | POA: Diagnosis not present

## 2015-04-23 DIAGNOSIS — Z1231 Encounter for screening mammogram for malignant neoplasm of breast: Secondary | ICD-10-CM

## 2015-04-23 LAB — HEMOGLOBIN A1C: Hgb A1c MFr Bld: 5.9 % (ref 4.6–6.5)

## 2015-04-23 NOTE — Progress Notes (Signed)
Pre visit review using our clinic review tool, if applicable. No additional management support is needed unless otherwise documented below in the visit note. 

## 2015-04-23 NOTE — Progress Notes (Signed)
   Subjective:    Patient ID: Diamond Boyd, female    DOB: 13-Jun-1940, 75 y.o.   MRN: [id number]  HPI    Review of Systems     Objective:   Physical Exam        Assessment & Plan:  I reviewed health advisor's note, was available for consultation, and agree with documentation and plan.

## 2015-04-23 NOTE — Patient Instructions (Addendum)
Diamond Boyd , Thank you for taking time to come for your Medicare Wellness Visit. I appreciate your ongoing commitment to your health goals. Please review the following plan we discussed and let me know if I can assist you in the future.   These are the goals we discussed: Goals   Starting 04/23/2015, I will attempt to complete 30 min to 1 hour of housework daily.     This is a list of the screening recommended for you and due dates:  Health Maintenance  Topic Date Due  . Mammogram  07/12/2015*  . Pneumonia vaccines (2 of 2 - PCV13) 04/10/2016*  . Flu Shot  09/11/2015  . Tetanus Vaccine  01/18/2016  . Colon Cancer Screening  09/05/2019  . DEXA scan (bone density measurement)  Completed  . Shingles Vaccine  Completed  *Topic was postponed. The date shown is not the original due date.   Preventive Care for Adults  A healthy lifestyle and preventive care can promote health and wellness. Preventive health guidelines for adults include the following key practices.  . A routine yearly physical is a good way to check with your health care provider about your health and preventive screening. It is a chance to share any concerns and updates on your health and to receive a thorough exam.  . Visit your dentist for a routine exam and preventive care every 6 months. Brush your teeth twice a day and floss once a day. Good oral hygiene prevents tooth decay and gum disease.  . The frequency of eye exams is based on your age, health, family medical history, use  of contact lenses, and other factors. Follow your health care provider's ecommendations for frequency of eye exams.  . Eat a healthy diet. Foods like vegetables, fruits, whole grains, low-fat dairy products, and lean protein foods contain the nutrients you need without too many calories. Decrease your intake of foods high in solid fats, added sugars, and salt. Eat the right amount of calories for you. Get information about a proper diet from  your health care provider, if necessary.  . Regular physical exercise is one of the most important things you can do for your health. Most adults should get at least 150 minutes of moderate-intensity exercise (any activity that increases your heart rate and causes you to sweat) each week. In addition, most adults need muscle-strengthening exercises on 2 or more days a week.  Silver Sneakers may be a benefit available to you. To determine eligibility, you may visit the website: www.silversneakers.com or contact program at 9285192899 Mon-Fri between 8AM-8PM.   . Maintain a healthy weight. The body mass index (BMI) is a screening tool to identify possible weight problems. It provides an estimate of body fat based on height and weight. Your health care provider can find your BMI and can help you achieve or maintain a healthy weight.   For adults 20 years and older: ? A BMI below 18.5 is considered underweight. ? A BMI of 18.5 to 24.9 is normal. ? A BMI of 25 to 29.9 is considered overweight. ? A BMI of 30 and above is considered obese.   . Maintain normal blood lipids and cholesterol levels by exercising and minimizing your intake of saturated fat. Eat a balanced diet with plenty of fruit and vegetables. Blood tests for lipids and cholesterol should begin at age 83 and be repeated every 5 years. If your lipid or cholesterol levels are high, you are over 50, or you are at  high risk for heart disease, you may need your cholesterol levels checked more frequently. Ongoing high lipid and cholesterol levels should be treated with medicines if diet and exercise are not working.  . If you smoke, find out from your health care provider how to quit. If you do not use tobacco, please do not start.  . If you choose to drink alcohol, please do not consume more than 2 drinks per day. One drink is considered to be 12 ounces (355 mL) of beer, 5 ounces (148 mL) of wine, or 1.5 ounces (44 mL) of liquor.  . If you  are 34-25 years old, ask your health care provider if you should take aspirin to prevent strokes.  . Use sunscreen. Apply sunscreen liberally and repeatedly throughout the day. You should seek shade when your shadow is shorter than you. Protect yourself by wearing long sleeves, pants, a wide-brimmed hat, and sunglasses year round, whenever you are outdoors.  . Once a month, do a whole body skin exam, using a mirror to look at the skin on your back. Tell your health care provider of new moles, moles that have irregular borders, moles that are larger than a pencil eraser, or moles that have changed in shape or color.

## 2015-04-23 NOTE — Progress Notes (Signed)
Subjective:   Diamond Boyd is a 75 y.o. female who presents for Medicare Annual (Subsequent) preventive examination.   Cardiac Risk Factors include: advanced age (>64mn, >>58women);hypertension;sedentary lifestyle;obesity (BMI >30kg/m2);dyslipidemia     Objective:     Vitals: BP 100/64 mmHg  Pulse 60  Temp(Src) 97.9 F (36.6 C) (Oral)  Ht _0  (1.676 m)  Wt 239 lb (108.41 kg)  BMI 38.59 kg/m2  SpO2 97%  LMP 02/10/1970  Tobacco History  Smoking status  . Never Smoker   Smokeless tobacco  . Never Used    Comment: Second-hand exposure through father.     Counseling given: No   Past Medical History  Diagnosis Date  . Colon polyps   . Depression   . Hypothyroidism   . Osteoarthritis     hands  . Peripheral vascular disease ([facility]   . Degenerative disk disease     Thoracic spine  . 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 Cancer ([place]     skin cancer- basal cell on arm / colon 2011  . Lymphoma ([facility]   . Colon cancer ([facility]     2011, s/p surgery, in remission  . Neuropathy due to chemotherapeutic drug ([facility] 06/19/2014  . hodgkins lymphoma   . Heart murmur   . GERD (gastroesophageal reflux disease)   . Anemia     hx blood tx  . Pneumonia   . 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 Benign essential HTN   . Chronic systolic CHF (congestive heart failure), NYHA class 2 ([place] 01/2015  . [place]IC[place] ([place]ischemic cardiomyopathy) [place]  . PAF (paroxysmal atrial fibrillation) ([facility]    Past Surgical History  Procedure Laterality Date  . Cholecystectomy    . Splenectomy      lymphoma  . Breast biopsy  1996  . Plantar fascia release    . Ventral hernia repair  1998  . Tendon release      Right thumb   . Colectomy  8/11  . Vaginal hysterectomy    . Bone marrow biopsy  05/27/13  . Cataract extraction, bilateral  2016  . Port-a-cath insertion    . Colonoscopy w/  biopsies    . Dg biopsy lung    . Video bronchoscopy with endobronchial ultrasound N/A 10/17/2014    Procedure: VIDEO BRONCHOSCOPY WITH ENDOBRONCHIAL ULTRASOUND;  Surgeon: JJavier Glazier MD;  [facility]  Service: Thoracic;  Laterality: N/A;  . Video bronchoscopy with endobronchial ultrasound N/A 11/01/2014    Procedure: VIDEO BRONCHOSCOPY WITH ENDOBRONCHIAL ULTRASOUND;  Surgeon: EGrace Isaac MD;  [place] [facility] [place]  Service: Thoracic;  Laterality: N/A;  . Mediastinoscopy N/A 11/01/2014    Procedure: MEDIASTINOSCOPY;  Surgeon: EGrace Isaac MD;  [place] [facility]  Service: Thoracic;  Laterality: N/A;  . Lymph node biopsy N/A 11/01/2014    Procedure: MEDIASTINAL LYMPH NODE BIOPSY;  Surgeon: EGrace Isaac MD;  [place] [facility]  Service: Thoracic;  Laterality: N/A;  . Cardiac catheterization N/A 01/25/2015    Procedure: Left Heart Cath and Coronary Angiography;  Surgeon: Peter M JMartinique MD;  [facility];  Service: Cardiovascular;  Laterality: N/A;   Family History  Problem Relation Age of Onset  . Coronary artery disease Mother   . Diabetes Mother   . Diabetes Brother   . Fibromyalgia Daughter     chronic pain   . COPD  Daughter   . Colon cancer Neg Hx   . Colon polyps Neg Hx   . Stomach cancer Neg Hx   . Rectal cancer Neg Hx   . Ulcerative colitis Neg Hx   . Crohn's disease Neg Hx   . Asthma Daughter   . Rheum arthritis Brother   . Clotting disorder Brother   . Alcohol abuse Sister    History  Sexual Activity  . Sexual Activity: No    Outpatient Encounter Prescriptions as of 04/23/2015  Medication Sig  . acetaminophen (TYLENOL) 650 MG CR tablet Take 650 mg by mouth every 8 (eight) hours as needed (For back pain.).  Marland Kitchen acyclovir (ZOVIRAX) 400 MG tablet Take 1 tablet (400 mg total) by mouth daily.  Marland Kitchen aspirin 81 MG tablet Take 81 mg by mouth daily.  . Calcium Carbonate-Vitamin D (CALCIUM 600+D) 600-400 MG-UNIT per tablet Take 1 tablet by mouth every  evening.   . celecoxib (CELEBREX) 200 MG capsule Take 1 capsule (200 mg total) by mouth daily as needed for moderate pain.  . Cholecalciferol (VITAMIN D-3) 1000 UNITS CAPS Take 1,000 Units by mouth daily.  Marland Kitchen docusate sodium (COLACE) 100 MG capsule Take 100 mg by mouth at bedtime.  Marland Kitchen estradiol (ESTRACE) 1 MG tablet Take 1 tablet (1 mg total) by mouth daily.  . fexofenadine (ALLEGRA) 180 MG tablet Take 1 tablet (180 mg total) by mouth daily.  Marland Kitchen FLUoxetine (PROZAC) 20 MG capsule Take 1 capsule (20 mg total) by mouth daily.  . fluticasone (FLONASE) 50 MCG/ACT nasal spray Place 2 sprays into both nostrils daily.  . furosemide (LASIX) 20 MG tablet Take 1 tablet (20 mg total) by mouth every other day. Take 1 tab by mouth daily for weight gain.  Marland Kitchen gabapentin (NEURONTIN) 300 MG capsule Take 1 capsule (300 mg total) by mouth 2 (two) times daily.  Marland Kitchen levothyroxine (SYNTHROID, LEVOTHROID) 112 MCG tablet Take 1 tablet (112 mcg total) by mouth daily before breakfast.  . lidocaine-prilocaine (EMLA) cream Apply 1 application topically as needed (For port-a-cath.). Reported on 02/22/2015  . losartan (COZAAR) 25 MG tablet Take 1 tablet (25 mg total) by mouth daily.  . metoprolol (LOPRESSOR) 50 MG tablet TAKE 1 TABLET (50 MG TOTAL) BY MOUTH 2 (TWO) TIMES DAILY.  Marland Kitchen Omega-3 350 MG CAPS Take 1 capsule by mouth daily. Reported on 02/22/2015  . omeprazole (PRILOSEC) 40 MG capsule Take 1 capsule (40 mg total) by mouth daily.  . ondansetron (ZOFRAN) 8 MG tablet Take 8 mg by mouth every 6 (six) hours as needed for nausea or vomiting. Reported on 03/05/2015  . polyethylene glycol (MIRALAX / GLYCOLAX) packet Take 17 g by mouth at bedtime.  . prochlorperazine (COMPAZINE) 10 MG tablet Take 10 mg by mouth every 6 (six) hours as needed for nausea or vomiting. Reported on 03/05/2015  . psyllium (METAMUCIL) 58.6 % packet Take 1-3 packets by mouth at bedtime.   . simvastatin (ZOCOR) 40 MG tablet Take 1 tablet (40 mg total) by mouth at  bedtime.  . vitamin E (VITAMIN E) 400 UNIT capsule Take 400 Units by mouth daily. Reported on 03/05/2015  . azithromycin (ZITHROMAX Z-PAK) 250 MG tablet Take 2 pills by mouth today and then 1 pill daily for 4 days (Patient not taking: Reported on 04/23/2015)  . fexofenadine (ALLEGRA) 180 MG tablet Take 180 mg by mouth daily. Reported on 04/23/2015   No facility-administered encounter medications on file as of 04/23/2015.    Activities of Daily Living In your  present state of health, do you have any difficulty performing the following activities: 04/23/2015 01/25/2015  Hearing? N N  Vision? N N  Difficulty concentrating or making decisions? Y N  Walking or climbing stairs? N N  Dressing or bathing? N N  Doing errands, shopping? N Y  Conservation officer, nature and eating ? N -  Using the Toilet? N -  In the past six months, have you accidently leaked urine? N -  Do you have problems with loss of bowel control? N -  Managing your Medications? N -  Managing your Finances? N -  Housekeeping or managing your Housekeeping? N -    Patient Care Team: Abner Greenspan, MD as PCP - General Mosetta Anis, MD (Allergy) Fanny Skates, MD as Consulting Physician (General Surgery) Grace Isaac, MD as Consulting Physician (Cardiothoracic Surgery) Troy Sine, MD as Consulting Physician (Cardiology) Lonn Georgia, PA-C as Physician Assistant (Cardiology) [name], MD as Consulting Physician (Hematology and Oncology) Estill Cotta, MD as Consulting Physician (Ophthalmology)  [facility]    Assessment:     Hearing Screening   _0  _1  _2  _3  _4  _5  _6   Right ear:   0 0 40 40   Left ear:   40 40 40 40    Vision Screening Comments: Last eye exam May 2016   Exercise Activities and Dietary recommendations Current Exercise Habits: The patient does not participate in regular exercise at present, Exercise limited by: cardiac condition(s)  Goals    Starting 04/23/2015, I will  attempt to complete 30 min to 1 hour of housework daily.     Fall Risk Fall Risk  04/23/2015 04/04/2015 12/07/2013 11/17/2013 10/20/2013  Falls in the past year? _7    Depression Screen PHQ 2/9 Scores 04/23/2015 04/04/2015 09/23/2013 09/01/2013  PHQ - 2 Score 0 0 0 0     Cognitive Testing MMSE - Mini Mental State Exam 04/23/2015  Orientation to time 5  Orientation to Place 5  Registration 3  Attention/ Calculation 5  Recall 3  Language- name 2 objects 0  Language- repeat 1  Language- follow 3 step command 3  Language- read & follow direction 1  Write a sentence 0  Copy design 0  Total score 26    Immunization History  Administered Date(s) Administered  . H1N1 03/01/2008  . Influenza Split 11/20/2010  . Influenza Whole 11/10/2005, 11/20/2006, 11/23/2008, 10/22/2009  . Influenza,inj,Quad PF,36+ Mos 11/17/2013, 11/14/2014  . Influenza-Unspecified 11/26/2011, 11/10/2012  . Meningococcal Polysaccharide 07/13/2007  . Pneumococcal Polysaccharide-23 02/11/2000, 07/08/2006, 10/08/2009, 12/07/2013  . Td 01/17/2006   Screening Tests Health Maintenance  Topic Date Due  . MAMMOGRAM  07/12/2015 (Originally 12/24/2014)  . PNA vac Low Risk Adult (2 of 2 - PCV13) 04/10/2016 (Originally 12/08/2014)  . INFLUENZA VACCINE  09/11/2015  . TETANUS/TDAP  01/18/2016  . COLONOSCOPY  09/05/2019  . DEXA SCAN  Completed  . ZOSTAVAX  Completed      Plan:     I have personally reviewed the Medicare Annual Wellness questionnaire and have noted the following in the patient's chart:  A. Medical and social history B. Use of alcohol, tobacco or illicit drugs  C. Current medications and supplements D. Functional ability and status E.  Nutritional status F.  Physical activity G. Advance directives H. List of other physicians I.  Hospitalizations, surgeries, and ER visits in previous 12 months J.  [place] to include hearing, vision, cognitive, depression L. Referrals and  appointments -  none  In addition, I reviewed preventive protocols, quality metrics, and best practice recommendations specific to patient. A written personalized care plan for preventive services as well as general preventive health recommendations were provided to patient.  See attached scanned questionnaire for additional information.   Signed,   Lindell Noe, MHA, BS, LPN Health Advisor 4/31/4276

## 2015-04-24 ENCOUNTER — Telehealth: Payer: Self-pay | Admitting: Family Medicine

## 2015-04-24 ENCOUNTER — Encounter: Payer: Self-pay | Admitting: *Deleted

## 2015-04-24 NOTE — Telephone Encounter (Signed)
Pt called wanting to call back Regarding her bp being low Sent to team health

## 2015-04-30 ENCOUNTER — Encounter: Payer: Self-pay | Admitting: Gastroenterology

## 2015-05-09 ENCOUNTER — Other Ambulatory Visit: Payer: Self-pay | Admitting: Hematology and Oncology

## 2015-05-11 ENCOUNTER — Telehealth: Payer: Self-pay | Admitting: Hematology and Oncology

## 2015-05-11 ENCOUNTER — Ambulatory Visit (HOSPITAL_BASED_OUTPATIENT_CLINIC_OR_DEPARTMENT_OTHER): Payer: Medicare Other | Admitting: Hematology and Oncology

## 2015-05-11 ENCOUNTER — Encounter: Payer: Self-pay | Admitting: Hematology and Oncology

## 2015-05-11 ENCOUNTER — Ambulatory Visit: Payer: Medicare Other

## 2015-05-11 ENCOUNTER — Ambulatory Visit (HOSPITAL_BASED_OUTPATIENT_CLINIC_OR_DEPARTMENT_OTHER): Payer: Medicare Other

## 2015-05-11 ENCOUNTER — Other Ambulatory Visit (HOSPITAL_BASED_OUTPATIENT_CLINIC_OR_DEPARTMENT_OTHER): Payer: Medicare Other

## 2015-05-11 VITALS — BP 130/65 | HR 66 | Temp 98.4°F | Resp 18 | Ht 66.0 in | Wt 241.9 lb

## 2015-05-11 VITALS — BP 127/51 | HR 58 | Temp 97.7°F | Resp 18

## 2015-05-11 DIAGNOSIS — Z5112 Encounter for antineoplastic immunotherapy: Secondary | ICD-10-CM

## 2015-05-11 DIAGNOSIS — T451X5A Adverse effect of antineoplastic and immunosuppressive drugs, initial encounter: Secondary | ICD-10-CM

## 2015-05-11 DIAGNOSIS — I5022 Chronic systolic (congestive) heart failure: Secondary | ICD-10-CM

## 2015-05-11 DIAGNOSIS — C8228 Follicular lymphoma grade III, unspecified, lymph nodes of multiple sites: Secondary | ICD-10-CM

## 2015-05-11 DIAGNOSIS — G620 Drug-induced polyneuropathy: Secondary | ICD-10-CM

## 2015-05-11 DIAGNOSIS — Z23 Encounter for immunization: Secondary | ICD-10-CM

## 2015-05-11 DIAGNOSIS — R531 Weakness: Secondary | ICD-10-CM

## 2015-05-11 DIAGNOSIS — Z95828 Presence of other vascular implants and grafts: Secondary | ICD-10-CM

## 2015-05-11 HISTORY — DX: Weakness: R53.1

## 2015-05-11 LAB — CBC WITH DIFFERENTIAL/PLATELET
BASO%: 0.5 % (ref 0.0–2.0)
Basophils Absolute: 0 10*3/uL (ref 0.0–0.1)
EOS%: 3.3 % (ref 0.0–7.0)
Eosinophils Absolute: 0.2 10*3/uL (ref 0.0–0.5)
HEMATOCRIT: 36.1 % (ref 34.8–46.6)
HEMOGLOBIN: 11.9 g/dL (ref 11.6–15.9)
LYMPH#: 1.6 10*3/uL (ref 0.9–3.3)
LYMPH%: 26.5 % (ref 14.0–49.7)
MCH: 31.5 pg (ref 25.1–34.0)
MCHC: 33 g/dL (ref 31.5–36.0)
MCV: 95.5 fL (ref 79.5–101.0)
MONO#: 0.9 10*3/uL (ref 0.1–0.9)
MONO%: 15.2 % — ABNORMAL HIGH (ref 0.0–14.0)
NEUT#: 3.2 10*3/uL (ref 1.5–6.5)
NEUT%: 54.5 % (ref 38.4–76.8)
Platelets: 202 10*3/uL (ref 145–400)
RBC: 3.78 10*6/uL (ref 3.70–5.45)
RDW: 13.6 % (ref 11.2–14.5)
WBC: 5.8 10*3/uL (ref 3.9–10.3)

## 2015-05-11 LAB — COMPREHENSIVE METABOLIC PANEL
ALBUMIN: 3.6 g/dL (ref 3.5–5.0)
ALT: 19 U/L (ref 0–55)
AST: 23 U/L (ref 5–34)
Alkaline Phosphatase: 142 U/L (ref 40–150)
Anion Gap: 9 mEq/L (ref 3–11)
BUN: 13.3 mg/dL (ref 7.0–26.0)
CALCIUM: 9.1 mg/dL (ref 8.4–10.4)
CO2: 26 mEq/L (ref 22–29)
CREATININE: 1 mg/dL (ref 0.6–1.1)
Chloride: 106 mEq/L (ref 98–109)
EGFR: 58 mL/min/{1.73_m2} — ABNORMAL LOW (ref >90)
Glucose: 191 mg/dl — ABNORMAL HIGH (ref 70–140)
Potassium: 3.6 mEq/L (ref 3.5–5.1)
Sodium: 140 mEq/L (ref 136–145)
TOTAL PROTEIN: 6 g/dL — AB (ref 6.4–8.3)
Total Bilirubin: <0.3 mg/dL (ref 0.20–1.20)

## 2015-05-11 MED ORDER — SODIUM CHLORIDE 0.9 % IV SOLN
375.0000 mg/m2 | Freq: Once | INTRAVENOUS | Status: AC
  Administered 2015-05-11: 800 mg via INTRAVENOUS
  Filled 2015-05-11: qty 70

## 2015-05-11 MED ORDER — PNEUMOCOCCAL 13-VAL CONJ VACC IM SUSP
0.5000 mL | Freq: Once | INTRAMUSCULAR | Status: AC
  Administered 2015-05-11: 0.5 mL via INTRAMUSCULAR
  Filled 2015-05-11: qty 0.5

## 2015-05-11 MED ORDER — SODIUM CHLORIDE 0.9 % IJ SOLN
10.0000 mL | INTRAMUSCULAR | Status: DC | PRN
  Administered 2015-05-11: 10 mL
  Filled 2015-05-11: qty 10

## 2015-05-11 MED ORDER — DIPHENHYDRAMINE HCL 25 MG PO CAPS
ORAL_CAPSULE | ORAL | Status: AC
  Filled 2015-05-11: qty 1

## 2015-05-11 MED ORDER — HEPARIN SOD (PORK) LOCK FLUSH 100 UNIT/ML IV SOLN
500.0000 [IU] | Freq: Once | INTRAVENOUS | Status: AC | PRN
  Administered 2015-05-11: 500 [IU]
  Filled 2015-05-11: qty 5

## 2015-05-11 MED ORDER — ACETAMINOPHEN 325 MG PO TABS
ORAL_TABLET | ORAL | Status: AC
  Filled 2015-05-11: qty 2

## 2015-05-11 MED ORDER — SODIUM CHLORIDE 0.9% FLUSH
10.0000 mL | INTRAVENOUS | Status: DC | PRN
  Administered 2015-05-11: 10 mL via INTRAVENOUS
  Filled 2015-05-11: qty 10

## 2015-05-11 MED ORDER — SODIUM CHLORIDE 0.9 % IV SOLN
Freq: Once | INTRAVENOUS | Status: AC
  Administered 2015-05-11: 11:00:00 via INTRAVENOUS

## 2015-05-11 MED ORDER — DIPHENHYDRAMINE HCL 25 MG PO CAPS
50.0000 mg | ORAL_CAPSULE | Freq: Once | ORAL | Status: AC
  Administered 2015-05-11: 50 mg via ORAL

## 2015-05-11 MED ORDER — ACETAMINOPHEN 325 MG PO TABS
650.0000 mg | ORAL_TABLET | Freq: Once | ORAL | Status: AC
  Administered 2015-05-11: 650 mg via ORAL

## 2015-05-11 NOTE — Patient Instructions (Signed)

## 2015-05-11 NOTE — Telephone Encounter (Signed)
Gave and printed appt sched and avs fo rpt for May °

## 2015-05-11 NOTE — Assessment & Plan Note (Signed)
She has combination of peripheral neuropathy and weakness from prior treatment. I believe the weakness is from deconditioning. I recommend she continues on gabapentin and I recommend referral to physical therapy and TENS unit to see if it would help with her neuropathy.

## 2015-05-11 NOTE — Assessment & Plan Note (Signed)
Clinically, she has no signs and symptoms to suggest as observation of congestive heart failure. She is on medical management. Recent echocardiogram show reasonable ejection fraction. Continue close monitoring and follow-up with cardiologist

## 2015-05-11 NOTE — Assessment & Plan Note (Signed)
Review with the patient and family members to role for rituximab treatment. 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 I discussed with her the role of maintenance rituximab Studies have shown maintenance treatment with rituximab for 2 years improve progression free survival Clinically, she has no evidence of disease today. I will proceed with rituximab and plan to repeat PET CT scan in 2 months before her next treatment to assess response to treatment.

## 2015-05-11 NOTE — Progress Notes (Signed)
[place] OFFICE PROGRESS NOTE  Patient Care Team: Abner Greenspan, MD as PCP - General Mosetta Anis, MD (Allergy) Fanny Skates, MD as Consulting Physician (General Surgery) Grace Isaac, MD as Consulting Physician (Cardiothoracic Surgery) Troy Sine, MD as Consulting Physician (Cardiology) Lonn Georgia, PA-C as Physician Assistant (Cardiology) [name], MD as Consulting Physician (Hematology and Oncology) Estill Cotta, MD as Consulting Physician (Ophthalmology)  SUMMARY OF ONCOLOGIC HISTORY: Oncology History   History of colon cancer   Staging form: Colon and Rectum, AJCC 7th Edition     Clinical: Stage IIA (T3, N0, M0) - Signed by [name], MD on 02/21/2014 History of hodgkin's lymphoma   Staging form: Lymphoid Neoplasms, AJCC 6th Edition     Clinical: Stage IV - Signed by [name], MD on 02/21/2014       History of hodgkin's lymphoma   05/20/2013 Initial Diagnosis Hodgkin lymphoma   06/27/2014 Imaging Interval increase in metabolic activity of several small lymph nodes is concerning for lymphoma recurrence. Lymph nodes include a small right level 2 lymph node, right hilar lymph node, and right paratracheal lymph node   07/07/2014 Pathology Results  limited tissue from ultrasound-guided biopsy was nondiagnostic   07/07/2014 Procedure  ultrasound-guided biopsy was nondiagnostic   09/27/2014 Imaging Repeat PET CT scan show diffuse disease concern for relapse.   10/17/2014 Pathology Results Accession: [id number] Biopsy was nondiagnostic   10/17/2014 Procedure She underwent bronchoscopy & EBUS & biopsy of  LN at Level 10L   11/01/2014 Surgery She underwent cronchoscopy with endobronchial ultrasound, mediastinoscopy and biopsy    11/01/2014 Pathology Results Accession: [id number] biopsy showed no evidence of lymphoma, only granulomas.   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 ([place])   12/13/2014 Imaging PET scan showed diffuse disease   12/28/2014 - 12/30/2014 Hospital Admission She was admitted to the hospital for cycle one of R-ICE   01/18/2015 - 01/20/2015 Chemotherapy She was admitted to the hospital for cycle 2 of R-ICE   01/20/2015 - 01/22/2015 Hospital Admission She was then admitted to [facility] regional with respiratory failure/fluid overload and was noted to have mild cardiomyopathy   01/23/2015 - 01/27/2015 Hospital Admission she was admitted to the hospital with new onset atrial fibrillation with rapid ventricular response and was found to have evidence of myocardial ischemia. She was discharged on medical management and oral anticoagulation therapy   01/25/2015 Procedure Left heart cath showed non-obstructive lesions: Prox RCA lesion, 40% stenosed.   Prox LAD to Mid LAD lesion, 30% stenosed. Ost LM lesion, 25% stenosed   01/30/2015 Imaging PET Ct scan showed near complete resolution of all disease    INTERVAL HISTORY: Please see below for problem oriented charting. She is seen today prior to cycle 2 of rituximab. She denies recent infection. No recent chest pain, shortness of breath or infection. She complained of bilateral lower extremity weakness and neuropathy. No new lymphadenopathy. Appetite is stable, denies recent weight change.  REVIEW OF SYSTEMS:   Constitutional: Denies fevers, chills or  abnormal weight loss Eyes: Denies blurriness of vision Ears, nose, mouth, throat, and face: Denies mucositis or sore  throat Respiratory: Denies cough, dyspnea or wheezes Cardiovascular: Denies palpitation, chest discomfort or lower extremity swelling Gastrointestinal:  Denies nausea, heartburn or change in bowel habits Skin: Denies abnormal skin rashes Lymphatics: Denies new lymphadenopathy or easy bruising Neurological:Denies numbness, tingling or new weaknesses Behavioral/Psych: Mood is stable, no new changes  All other systems were reviewed with the patient and are negative.  I have reviewed the past medical history, past surgical history, social history and family history with the patient and they are unchanged from previous note.  ALLERGIES:  is allergic to achromycin; allopurinol; astelin; cephalexin; codeine; lisinopril; meloxicam; minocycline; nabumetone; nyquil; penicillins; sulfa antibiotics; zolpidem tartrate; buspar; and ciprofloxacin.  MEDICATIONS:  Current Outpatient Prescriptions  Medication Sig Dispense Refill  . acetaminophen (TYLENOL) 650 MG CR tablet Take 650 mg by mouth every 8 (eight) hours as needed (For back pain.).    Marland Kitchen acyclovir (ZOVIRAX) 400 MG tablet Take 1 tablet (400 mg total) by mouth daily. 90 tablet 3  . aspirin 81 MG tablet Take 81 mg by mouth daily.    . Calcium Carbonate-Vitamin D (CALCIUM 600+D) 600-400 MG-UNIT per tablet Take 1 tablet by mouth every evening.     . celecoxib (CELEBREX) 200 MG capsule Take 1 capsule (200 mg total) by mouth daily as needed for moderate pain. 90 capsule 3  . Cholecalciferol (VITAMIN D-3) 1000 UNITS CAPS Take 1,000 Units by mouth daily.    Marland Kitchen docusate sodium (COLACE) 100 MG capsule Take 100 mg by mouth at bedtime.    Marland Kitchen estradiol (ESTRACE) 1 MG tablet Take 1 tablet (1 mg total) by mouth daily. 90 tablet 3  . fexofenadine (ALLEGRA) 180 MG tablet Take 180 mg by mouth daily. Reported on 04/23/2015    . FLUoxetine (PROZAC) 20 MG capsule Take 1 capsule (20 mg total) by mouth daily. 90 capsule 3  . fluticasone (FLONASE) 50 MCG/ACT nasal spray Place  2 sprays into both nostrils daily. 48 g 3  . furosemide (LASIX) 20 MG tablet Take 1 tablet (20 mg total) by mouth every other day. Take 1 tab by mouth daily for weight gain. 90 tablet 3  . gabapentin (NEURONTIN) 300 MG capsule Take 1 capsule (300 mg total) by mouth 2 (two) times daily. 180 capsule 3  . levothyroxine (SYNTHROID, LEVOTHROID) 112 MCG tablet Take 1 tablet (112 mcg total) by mouth daily before breakfast. 90 tablet 3  . lidocaine-prilocaine (EMLA) cream Apply 1 application topically as needed (For port-a-cath.). Reported on 02/22/2015    . losartan (COZAAR) 25 MG tablet Take 1 tablet (25 mg total) by mouth daily. 30 tablet 3  . metoprolol (LOPRESSOR) 50 MG tablet TAKE 1 TABLET (50 MG TOTAL) BY MOUTH 2 (TWO) TIMES DAILY. 180 tablet 3  . Omega-3 350 MG CAPS Take 1 capsule by mouth daily. Reported on 02/22/2015    . omeprazole (PRILOSEC) 40 MG capsule Take 1 capsule (40 mg total) by mouth daily. 90 capsule 3  . ondansetron (ZOFRAN) 8 MG tablet Take 8 mg by mouth every 6 (six) hours as needed for nausea or vomiting. Reported on 03/05/2015    . polyethylene glycol (MIRALAX / GLYCOLAX) packet Take 17 g by mouth at bedtime.    . prochlorperazine (COMPAZINE) 10 MG tablet Take 10 mg by mouth every 6 (six) hours as needed for nausea or vomiting. Reported on 03/05/2015    . psyllium (METAMUCIL) 58.6 % packet  Take 1-3 packets by mouth at bedtime.     . simvastatin (ZOCOR) 40 MG tablet Take 1 tablet (40 mg total) by mouth at bedtime. 90 tablet 0  . vitamin E (VITAMIN E) 400 UNIT capsule Take 400 Units by mouth daily. Reported on 03/05/2015     Current Facility-Administered Medications  Medication Dose Route Frequency Provider Last Rate Last Dose  . pneumococcal 13-valent conjugate vaccine (PREVNAR 13) injection 0.5 mL  0.5 mL Intramuscular Once [name], MD       Facility-Administered Medications Ordered in Other Visits  Medication Dose Route Frequency Provider Last Rate Last Dose  . 0.9 %  sodium  chloride infusion   Intravenous Once [name], MD      . heparin lock flush 100 unit/mL  500 Units Intracatheter Once PRN [name], MD      . riTUXimab (RITUXAN) 800 mg in sodium chloride 0.9 % 170 mL chemo infusion  375 mg/m2 (Treatment Plan Actual) Intravenous Once [name], MD      . sodium chloride 0.9 % injection 10 mL  10 mL Intracatheter PRN [name], MD        PHYSICAL EXAMINATION: ECOG PERFORMANCE STATUS: 1 - Symptomatic but completely ambulatory  Filed Vitals:   05/11/15 1020  BP: 130/65  Pulse: 66  Temp: 98.4 F (36.9 C)  Resp: 18   Filed Weights   05/11/15 1020  Weight: 241 lb 14.4 oz (109.725 kg)    GENERAL:alert, no distress and comfortable SKIN: skin color, texture, turgor are normal, no rashes or significant lesions EYES: normal, Conjunctiva are pink and non-injected, sclera clear OROPHARYNX:no exudate, no erythema and lips, buccal mucosa, and tongue normal  NECK: supple, thyroid normal size, non-tender, without nodularity LYMPH:  no palpable lymphadenopathy in the cervical, axillary or inguinal LUNGS: clear to auscultation and percussion with normal breathing effort HEART: regular rate & rhythm and no murmursWith mild bilateral lower extremity edema ABDOMEN:abdomen soft, non-tender and normal bowel sounds Musculoskeletal:no cyanosis of digits and no clubbing  NEURO: alert & oriented x 3 with fluent speech, no focal motor/sensory deficits  LABORATORY DATA:  I have reviewed the data as listed    Component Value Date/Time   NA 140 05/11/2015 1002   NA 140 04/09/2015 1300   K 3.6 05/11/2015 1002   K 4.2 04/09/2015 1300   CL 102 04/09/2015 1300   CL 105 07/14/2012 0939   CO2 26 05/11/2015 1002   CO2 33* 04/09/2015 1300   GLUCOSE 191* 05/11/2015 1002   GLUCOSE 125* 04/09/2015 1300   GLUCOSE 103* 07/14/2012 0939   BUN 13.3 05/11/2015 1002   BUN 13 04/09/2015 1300   CREATININE 1.0 05/11/2015 1002   CREATININE 0.95* 04/09/2015 1300   CREATININE  1.01* 01/27/2015 0625   CALCIUM 9.1 05/11/2015 1002   CALCIUM 9.3 04/09/2015 1300   CALCIUM 9.3 08/09/2009 0000   PROT 6.0* 05/11/2015 1002   PROT 5.3* 01/24/2015 0855   ALBUMIN 3.6 05/11/2015 1002   ALBUMIN 3.1* 01/24/2015 0855   AST 23 05/11/2015 1002   AST 32 01/24/2015 0855   ALT 19 05/11/2015 1002   ALT 38 01/24/2015 0855   ALKPHOS 142 05/11/2015 1002   ALKPHOS 160* 01/24/2015 0855   BILITOT <0.30 05/11/2015 1002   BILITOT 1.0 01/24/2015 0855   GFRNONAA 53* 01/27/2015 0625   GFRAA >60 01/27/2015 0625    No results found for: SPEP, UPEP  Lab Results  Component Value Date   WBC 5.8 05/11/2015   NEUTROABS  3.2 05/11/2015   HGB 11.9 05/11/2015   HCT 36.1 05/11/2015   MCV 95.5 05/11/2015   PLT 202 05/11/2015      Chemistry      Component Value Date/Time   NA 140 05/11/2015 1002   NA 140 04/09/2015 1300   K 3.6 05/11/2015 1002   K 4.2 04/09/2015 1300   CL 102 04/09/2015 1300   CL 105 07/14/2012 0939   CO2 26 05/11/2015 1002   CO2 33* 04/09/2015 1300   BUN 13.3 05/11/2015 1002   BUN 13 04/09/2015 1300   CREATININE 1.0 05/11/2015 1002   CREATININE 0.95* 04/09/2015 1300   CREATININE 1.01* 01/27/2015 0625      Component Value Date/Time   CALCIUM 9.1 05/11/2015 1002   CALCIUM 9.3 04/09/2015 1300   CALCIUM 9.3 08/09/2009 0000   ALKPHOS 142 05/11/2015 1002   ALKPHOS 160* 01/24/2015 0855   AST 23 05/11/2015 1002   AST 32 01/24/2015 0855   ALT 19 05/11/2015 1002   ALT 38 01/24/2015 0855   BILITOT <0.30 05/11/2015 1002   BILITOT 1.0 01/24/2015 0855      ASSESSMENT & PLAN:  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 I discussed with her the role of maintenance rituximab Studies  have shown maintenance treatment with rituximab for 2 years improve progression free survival Clinically, she has no evidence of disease today. I will proceed with rituximab and plan to repeat PET CT scan in 2 months before her next treatment to assess response to treatment.  Peripheral neuropathy due to chemotherapy [facility]) She has combination of peripheral neuropathy and weakness from prior treatment. I believe the weakness is from deconditioning. I recommend she continues on gabapentin and I recommend referral to physical therapy and TENS unit to see if it would help with her neuropathy.  Chronic systolic CHF (congestive heart failure), NYHA class 2 (HCC) Clinically, she has no signs and symptoms to suggest as observation of congestive heart failure. She is on medical management. Recent echocardiogram show reasonable ejection fraction. Continue close monitoring and follow-up with cardiologist     Orders Placed This Encounter  Procedures  . NM PET Image Restag (PS) Skull Base To Thigh    Standing Status: Future     Number of Occurrences:      Standing Expiration Date: 07/10/2016    Order Specific Question:  Reason for Exam (SYMPTOM  OR DIAGNOSIS REQUIRED)    Answer:  recurrent lymphoma, assess response to Rx    Order Specific Question:  Preferred imaging location?    Answer:  [facility]  . Ambulatory referral to Physical Therapy    Referral Priority:  Routine    Referral Type:  Physical Medicine    Referral Reason:  Specialty Services Required    Requested Specialty:  Physical Therapy    Number of Visits Requested:  1   All questions were answered. The patient knows to call the clinic with any problems, questions or concerns. No barriers to learning was detected. I spent 25 minutes counseling the patient face to face. The total time spent in the appointment was 30 minutes and more than 50% was on counseling and review of test results     [facility], Previn Jian, MD  05/11/2015  11:20 AM

## 2015-05-14 ENCOUNTER — Ambulatory Visit
Admission: RE | Admit: 2015-05-14 | Discharge: 2015-05-14 | Disposition: A | Discharge status: Home / Self Care | Payer: Medicare Other | Source: Ambulatory Visit

## 2015-05-14 DIAGNOSIS — Z1231 Encounter for screening mammogram for malignant neoplasm of breast: Secondary | ICD-10-CM

## 2015-05-14 LAB — HM MAMMOGRAPHY: HM MAMMO: NORMAL (ref 0–4)

## 2015-05-15 ENCOUNTER — Encounter: Payer: Self-pay | Admitting: *Deleted

## 2015-05-28 ENCOUNTER — Telehealth: Payer: Self-pay | Admitting: *Deleted

## 2015-05-28 ENCOUNTER — Encounter: Payer: Self-pay | Admitting: *Deleted

## 2015-05-28 ENCOUNTER — Other Ambulatory Visit: Payer: Self-pay | Admitting: *Deleted

## 2015-05-28 DIAGNOSIS — T451X5A Adverse effect of antineoplastic and immunosuppressive drugs, initial encounter: Principal | ICD-10-CM

## 2015-05-28 DIAGNOSIS — G620 Drug-induced polyneuropathy: Secondary | ICD-10-CM

## 2015-05-28 NOTE — Telephone Encounter (Signed)
-----   Message from Patton Salles, RN sent at 05/28/2015 11:27 AM EDT ----- PT referral.  Pt called triage and states she has not heard anything regarding PT referral that was placed on 05/11/15

## 2015-05-28 NOTE — Telephone Encounter (Signed)
Pt called Triage to let them know she had not heard anything about Physical Therapy yet.  Dr. Alvy Bimler had ordered PHT For TENS unit for Neuropathy.  Re-entered order for Physical Therapy referral into EMR for [facility].   Called pt to notify her I placed new order and she says she would like to go to [facility] for Physical therapy since it is closer to her.  I placed new order to [place] at [facility] and I called their office at phone [phone] to make sure they got the order.  The lady I s/w says she will call us back when she has a chance to look for the order.

## 2015-05-31 ENCOUNTER — Telehealth: Payer: Self-pay | Admitting: *Deleted

## 2015-05-31 DIAGNOSIS — T451X5A Adverse effect of antineoplastic and immunosuppressive drugs, initial encounter: Principal | ICD-10-CM

## 2015-05-31 DIAGNOSIS — G620 Drug-induced polyneuropathy: Secondary | ICD-10-CM

## 2015-05-31 NOTE — Telephone Encounter (Signed)
PHT referral for TENS for Neuropathy made to [facility] outpatient rehab in [place].  Received message from them that pt wishes to go somewhere closer to her home,  So I sent referral to [facility]. I reviewed Referral order to [facility] and they canceled the referral putting in a note that pt wants to go to [facility]. Location.   Called pt to Clarify and she says she wants to go to [place] and she doesn't know why they canceled it.  She doesn't remember telling them she wants to go to [facility].  I called [facility] and they say they have a [place] location in [place] (not [place]) and pt requested to go there.  They sent referral to the [facility] location so pt should be getting a call from them.   I called pt back and informed of [facility] location in [place] and I gave her their phone number to call for appt if she doesn't hear from them in the next day or two.   Pt verbalized understanding and remembers now that this location is closest to her home.

## 2015-06-01 ENCOUNTER — Ambulatory Visit: Payer: Medicare Other | Admitting: Physical Therapy

## 2015-06-08 ENCOUNTER — Ambulatory Visit (INDEPENDENT_AMBULATORY_CARE_PROVIDER_SITE_OTHER): Payer: Medicare Other | Admitting: Family Medicine

## 2015-06-08 ENCOUNTER — Encounter: Payer: Self-pay | Admitting: Family Medicine

## 2015-06-08 VITALS — BP 118/68 | HR 67 | Temp 98.7°F | Ht 66.0 in | Wt 241.2 lb

## 2015-06-08 DIAGNOSIS — I2510 Atherosclerotic heart disease of native coronary artery without angina pectoris: Secondary | ICD-10-CM

## 2015-06-08 DIAGNOSIS — J328 Other chronic sinusitis: Secondary | ICD-10-CM | POA: Diagnosis not present

## 2015-06-08 MED ORDER — DOXYCYCLINE HYCLATE 100 MG PO TABS
100.0000 mg | ORAL_TABLET | Freq: Two times a day (BID) | ORAL | Status: DC
Start: 2015-06-08 — End: 2015-07-06

## 2015-06-08 NOTE — Patient Instructions (Signed)
Take the doxycycline for sinus infection with non dairy food for 10 days Drink lots of fluids Continue your flonase  Use nasal saline spray if it helps Warm compresses on painful areas of face also   Update if not starting to improve in a week or if worsening

## 2015-06-08 NOTE — Progress Notes (Signed)
Subjective:    Patient ID: Diamond Boyd, female    DOB: 05-17-1940, 75 y.o.   MRN: [id number]  HPI Here for a sinus infection   Worse on the L   Got better from march and then returned 2 weeks   (last tx was z pak)   Green nasal d/c Pain under L eye  Ears and throat are ok  No fever  No cough   Patient Active Problem List   Diagnosis Date Noted  . Generalized weakness 05/11/2015  . Benign essential HTN   . PAF (paroxysmal atrial fibrillation) ([place])   . Internal hemorrhoids 02/21/2015  . Rectal bleeding 02/06/2015  . 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 Thrombocytopenia ([place]) 01/29/2015  . AKI (acute kidney injury) ([place])   . Elevated troponin   . Atrial fibrillation with rapid ventricular response ([place]) 01/23/2015  . New onset a-fib ([place]) 01/23/2015  . Hypertension 01/23/2015  . CHF (congestive heart failure) ([place]) 01/21/2015  . Chronic systolic CHF (congestive heart failure), NYHA class 2 ([place]) 01/11/2015  . Fever, unspecified 01/10/2015  . Immunocompromised status associated with infection ([place]) 01/08/2015  . 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 Preventive measure 11/14/2014  . Parotid mass 11/14/2014  . Chronic lower back pain 09/28/2014  . Mediastinal lymphadenopathy 09/27/2014  . Neuropathy due to chemotherapeutic drug ([place]) 06/19/2014  . Peripheral neuropathy due to chemotherapy ([place]) 04/03/2014  . Trochanteric bursitis of right hip 04/03/2014  . Anemia in chronic illness 02/21/2014  . Elevated liver enzymes 02/21/2014  . Urinary tract infection 11/18/2013  . Candidal intertrigo 09/23/2013  . Atrophic vaginitis 09/23/2013  . Drug-induced neutropenia ([place]) 09/01/2013  . History of colon cancer 06/17/2013  . History of hodgkin's lymphoma 05/20/2013  . Dysuria 05/20/2013  . Conjunctivitis, acute 04/27/2013  . Cold sore  04/27/2013  . Left temporal headache 03/11/2013  . Hyperglycemia 03/11/2013  . Acute maxillary sinusitis 02/09/2013  . Other malaise and fatigue 01/04/2013  . Herpes zoster 11/22/2012  . Vaginal pain 11/22/2012  . Cystocele 09/22/2012  . Encounter for Medicare annual wellness exam 09/17/2012  . Left knee pain 09/10/2012  . Thoracic back pain 06/01/2012  . Skin lesion of face 05/17/2012  . Other screening mammogram 07/22/2011  . Routine gynecological examination 07/22/2011  . Colon cancer screening 07/22/2011  . History of anemia 05/06/2011  . 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 Hypothyroid 02/24/2011  . Varicose veins 02/24/2011  . Elevated blood pressure 11/20/2010  . Obesity 11/20/2010  . 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 Other chronic sinusitis 07/03/2009  . THROAT PAIN, CHRONIC 07/03/2009  . GERD 12/20/2008  . Anxiety state, unspecified 08/22/2008  . MENOPAUSAL SYNDROME 07/13/2007  . HYPERCHOLESTEROLEMIA, PURE 07/08/2006  . Allergic rhinitis 07/08/2006  . OVERACTIVE BLADDER 07/08/2006  . 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 Depression 06/25/2006  . PERIPHERAL VASCULAR DISEASE 06/25/2006  . OSTEOARTHRITIS 06/25/2006  . LEG EDEMA 06/25/2006  . SKIN CANCER, HX OF 06/25/2006  . COLONIC POLYPS, HX OF 06/25/2006   Past Medical History  Diagnosis Date  . Colon polyps   . Depression   . Hypothyroidism   . Osteoarthritis     hands  . Peripheral vascular disease ([place])   . Degenerative disk disease     Thoracic spine  . 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 Cancer ([place])     skin  cancer- basal cell on arm / colon 2011  . Lymphoma ([place])   . Colon cancer ([place])     2011, s/p surgery, in remission  . Neuropathy due to chemotherapeutic drug ([place]) 06/19/2014  . hodgkins lymphoma   . Heart murmur   . GERD (gastroesophageal reflux disease)   . Anemia     hx blood tx  . Pneumonia   . 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 Benign essential HTN   . Chronic systolic CHF (congestive heart failure), NYHA class 2 ([place]) 01/2015  . NICM (nonischemic cardiomyopathy) ([place]) 01/2015  . PAF (paroxysmal atrial fibrillation) ([place])   . Generalized weakness 05/11/2015   Past Surgical History  Procedure Laterality Date  . Cholecystectomy    . Splenectomy      lymphoma  . Breast biopsy  1996  . Plantar fascia release    . Ventral hernia repair  1998  . Tendon release      Right thumb   . Colectomy  8/11  . Vaginal hysterectomy    . Bone marrow biopsy  05/27/13  . Cataract extraction, bilateral  2016  . Port-a-cath insertion    . Colonoscopy w/ biopsies    . Dg biopsy lung    . Video bronchoscopy with endobronchial ultrasound N/A 10/17/2014    Procedure: VIDEO BRONCHOSCOPY WITH ENDOBRONCHIAL ULTRASOUND;  Surgeon: Javier Glazier, MD;  Location: [facility];  Service: Thoracic;  Laterality: N/A;  . Video bronchoscopy with endobronchial ultrasound N/A 11/01/2014    Procedure: VIDEO BRONCHOSCOPY WITH ENDOBRONCHIAL ULTRASOUND;  Surgeon: Grace Isaac, MD;  Location: [facility];  Service: Thoracic;  Laterality: N/A;  . Mediastinoscopy N/A 11/01/2014    Procedure: MEDIASTINOSCOPY;  Surgeon: Grace Isaac, MD;  Location: [facility];  Service: Thoracic;  Laterality: N/A;  . Lymph node biopsy N/A 11/01/2014    Procedure: MEDIASTINAL LYMPH NODE BIOPSY;  Surgeon: Grace Isaac, MD;  Location: [facility];  Service: Thoracic;  Laterality: N/A;  . Cardiac catheterization N/A 01/25/2015    Procedure: Left Heart Cath and Coronary Angiography;  Surgeon: Peter M Martinique, MD;  Location: [facility];  Service: Cardiovascular;  Laterality: N/A;   Social History  Substance Use Topics  . Smoking status: Never Smoker   . Smokeless tobacco: Never Used     Comment: Second-hand exposure through father.  . Alcohol Use: No   Family History  Problem Relation Age of Onset  .  Coronary artery disease Mother   . Diabetes Mother   . Diabetes Brother   . Fibromyalgia Daughter     chronic pain   . COPD Daughter   . Colon cancer Neg Hx   . Colon polyps Neg Hx   . Stomach cancer Neg Hx   . Rectal cancer Neg Hx   . Ulcerative colitis Neg Hx   . Crohn's disease Neg Hx   . Asthma Daughter   . Rheum arthritis Brother   . Clotting disorder Brother   . Alcohol abuse Sister    Allergies  Allergen Reactions  . Achromycin [Tetracycline Hcl] Other (See Comments)    Pt does not remember reaction  . Allopurinol Other (See Comments)    REACTION: Unsure of reaction happene years ago  . Astelin [Azelastine Hcl] Other (See Comments)    Reaction unknown  . Cephalexin Other (See Comments)    REACTION: unsure of reaction happened yrs  ago.  . Codeine Other (See Comments)    REACTION: abd. pain  . Lisinopril Cough  . Meloxicam Other (See Comments)    REACTION: GI symptoms  . Minocycline Other (See Comments)    Abdominal pain  . Nabumetone Other (See Comments)    REACTION: reaction not known  . Nyquil [Pseudoeph-Doxylamine-Dm-Apap] Hives  . Penicillins Other (See Comments)    Reaction unknown Has patient had a PCN reaction causing immediate rash, facial/tongue/throat swelling, SOB or lightheadedness with hypotension: unsure reaction unknown Has patient had a PCN reaction causing severe rash involving mucus membranes or skin necrosis: unsure reaction unknown Has patient had a PCN reaction that required hospitalization no Has patient had a PCN reaction occurring within the last 10 years: no If all of the above answers are "NO", then may proceed with Cephalosporin use.  . Sulfa Antibiotics Other (See Comments)    Gi side eff   . Zolpidem Tartrate Other (See Comments)    REACTION: feels too drugged  . Buspar [Buspirone Hcl] Other (See Comments)    Dizziness, and not as effective for anxiety  . Ciprofloxacin Rash   Current Outpatient Prescriptions on File Prior to Visit   Medication Sig Dispense Refill  . acetaminophen (TYLENOL) 650 MG CR tablet Take 650 mg by mouth every 8 (eight) hours as needed (For back pain.).    Marland Kitchen acyclovir (ZOVIRAX) 400 MG tablet Take 1 tablet (400 mg total) by mouth daily. 90 tablet 3  . aspirin 81 MG tablet Take 81 mg by mouth daily.    . Calcium Carbonate-Vitamin D (CALCIUM 600+D) 600-400 MG-UNIT per tablet Take 1 tablet by mouth every evening.     . celecoxib (CELEBREX) 200 MG capsule Take 1 capsule (200 mg total) by mouth daily as needed for moderate pain. 90 capsule 3  . Cholecalciferol (VITAMIN D-3) 1000 UNITS CAPS Take 1,000 Units by mouth daily.    Marland Kitchen docusate sodium (COLACE) 100 MG capsule Take 100 mg by mouth at bedtime.    Marland Kitchen estradiol (ESTRACE) 1 MG tablet Take 1 tablet (1 mg total) by mouth daily. 90 tablet 3  . fexofenadine (ALLEGRA) 180 MG tablet Take 180 mg by mouth daily. Reported on 04/23/2015    . FLUoxetine (PROZAC) 20 MG capsule Take 1 capsule (20 mg total) by mouth daily. 90 capsule 3  . fluticasone (FLONASE) 50 MCG/ACT nasal spray Place 2 sprays into both nostrils daily. 48 g 3  . furosemide (LASIX) 20 MG tablet Take 1 tablet (20 mg total) by mouth every other day. Take 1 tab by mouth daily for weight gain. 90 tablet 3  . gabapentin (NEURONTIN) 300 MG capsule Take 1 capsule (300 mg total) by mouth 2 (two) times daily. 180 capsule 3  . levothyroxine (SYNTHROID, LEVOTHROID) 112 MCG tablet Take 1 tablet (112 mcg total) by mouth daily before breakfast. 90 tablet 3  . lidocaine-prilocaine (EMLA) cream Apply 1 application topically as needed (For port-a-cath.). Reported on 02/22/2015    . losartan (COZAAR) 25 MG tablet Take 1 tablet (25 mg total) by mouth daily. 30 tablet 3  . metoprolol (LOPRESSOR) 50 MG tablet TAKE 1 TABLET (50 MG TOTAL) BY MOUTH 2 (TWO) TIMES DAILY. 180 tablet 3  . Omega-3 350 MG CAPS Take 1 capsule by mouth daily. Reported on 02/22/2015    . omeprazole (PRILOSEC) 40 MG capsule Take 1 capsule (40 mg total)  by mouth daily. 90 capsule 3  . ondansetron (ZOFRAN) 8 MG tablet Take 8 mg by mouth  every 6 (six) hours as needed for nausea or vomiting. Reported on 03/05/2015    . polyethylene glycol (MIRALAX / GLYCOLAX) packet Take 17 g by mouth at bedtime.    . prochlorperazine (COMPAZINE) 10 MG tablet Take 10 mg by mouth every 6 (six) hours as needed for nausea or vomiting. Reported on 03/05/2015    . psyllium (METAMUCIL) 58.6 % packet Take 1-3 packets by mouth at bedtime.     . simvastatin (ZOCOR) 40 MG tablet Take 1 tablet (40 mg total) by mouth at bedtime. 90 tablet 0  . vitamin E (VITAMIN E) 400 UNIT capsule Take 400 Units by mouth daily. Reported on 03/05/2015     No current facility-administered medications on file prior to visit.    Review of Systems  Constitutional: Positive for appetite change. Negative for fever and fatigue.  HENT: Positive for congestion, ear pain, postnasal drip, rhinorrhea, sinus pressure and sore throat. Negative for nosebleeds.   Eyes: Negative for pain, redness and itching.  Respiratory: Positive for cough. Negative for shortness of breath and wheezing.   Cardiovascular: Negative for chest pain.  Gastrointestinal: Negative for nausea, vomiting, abdominal pain and diarrhea.  Endocrine: Negative for polyuria.  Genitourinary: Negative for dysuria, urgency and frequency.  Musculoskeletal: Negative for myalgias and arthralgias.  Allergic/Immunologic: Negative for immunocompromised state.  Neurological: Positive for headaches. Negative for dizziness, tremors, syncope, weakness and numbness.  Hematological: Negative for adenopathy. Does not bruise/bleed easily.  Psychiatric/Behavioral: Negative for dysphoric mood. The patient is not nervous/anxious.        Objective:   Physical Exam  Constitutional: She appears well-developed and well-nourished. No distress.  obese and well appearing   HENT:  Head: Normocephalic and atraumatic.  Right Ear: External ear normal.  Left Ear:  External ear normal.  Mouth/Throat: Oropharynx is clear and moist. No oropharyngeal exudate.  Nares are injected and congested  Bilateral maxillary sinus tenderness - worse on the L Post nasal drip   Eyes: Conjunctivae and EOM are normal. Pupils are equal, round, and reactive to light. Right eye exhibits no discharge. Left eye exhibits no discharge.  Neck: Normal range of motion. Neck supple.  Cardiovascular: Normal rate and regular rhythm.   Pulmonary/Chest: Effort normal and breath sounds normal. No respiratory distress. She has no wheezes. She has no rales.  Lymphadenopathy:    She has no cervical adenopathy.  Neurological: She is alert. No cranial nerve deficit.  Skin: Skin is warm and dry. No rash noted.  Psychiatric: She has a normal mood and affect.          Assessment & Plan:   Problem List Items Addressed This Visit      Respiratory   Other chronic sinusitis - Primary    This episode is left sided  Will try doxycycline and see if she tolerates it -worry she may be at risk of resistance to azithro  Disc symptomatic care - see instructions on AVS  Update if not starting to improve in a week or if worsening        Relevant Medications   doxycycline (VIBRA-TABS) 100 MG tablet

## 2015-06-08 NOTE — Progress Notes (Signed)
Pre visit review using our clinic review tool, if applicable. No additional management support is needed unless otherwise documented below in the visit note. 

## 2015-06-10 NOTE — Assessment & Plan Note (Signed)
This episode is left sided  Will try doxycycline and see if she tolerates it -worry she may be at risk of resistance to azithro  Disc symptomatic care - see instructions on AVS  Update if not starting to improve in a week or if worsening

## 2015-06-19 ENCOUNTER — Ambulatory Visit: Payer: Medicare Other | Admitting: Physical Therapy

## 2015-06-21 ENCOUNTER — Ambulatory Visit (INDEPENDENT_AMBULATORY_CARE_PROVIDER_SITE_OTHER): Payer: Medicare Other | Admitting: Cardiovascular Disease

## 2015-06-21 ENCOUNTER — Encounter: Payer: Self-pay | Admitting: Cardiovascular Disease

## 2015-06-21 VITALS — BP 118/66 | HR 56 | Ht 65.0 in | Wt 242.8 lb

## 2015-06-21 DIAGNOSIS — I2510 Atherosclerotic heart disease of native coronary artery without angina pectoris: Secondary | ICD-10-CM

## 2015-06-21 DIAGNOSIS — I10 Essential (primary) hypertension: Secondary | ICD-10-CM | POA: Diagnosis not present

## 2015-06-21 DIAGNOSIS — E785 Hyperlipidemia, unspecified: Secondary | ICD-10-CM | POA: Diagnosis not present

## 2015-06-21 DIAGNOSIS — Z8571 Personal history of Hodgkin lymphoma: Secondary | ICD-10-CM | POA: Diagnosis not present

## 2015-06-21 DIAGNOSIS — I480 Paroxysmal atrial fibrillation: Secondary | ICD-10-CM

## 2015-06-21 MED ORDER — LOSARTAN POTASSIUM 25 MG PO TABS: 25.0000 mg | ORAL_TABLET | Freq: Every day | ORAL | Status: DC

## 2015-06-21 NOTE — Patient Instructions (Signed)
Your physician wants you to follow-up in: 6 months or sooner if needed. You will receive a reminder letter in the mail two months in advance. If you don't receive a letter, please call our office to schedule the follow-up appointment.   If you need a refill on your cardiac medications before your next appointment, please call your pharmacy. 

## 2015-06-23 NOTE — Progress Notes (Signed)
Patient ID: Diamond Boyd, female   DOB: [birth year], 75 y.o.   MRN: [id number]    Primary MD: Dr. Glori Bickers  HPI: Diamond Boyd is a 75 y.o. female who presents to the office today for a follow up cardiology evaluation following her recent hospitalization.  Diamond Boyd is a 75 year old female who has a history of colon CA, Hodgkin's lymphoma, recent diagnosis of follicular lymphoma, and had initiated chemotherapy.  She also has a history of neuropathy,  and hypothyroidism.  She was admitted to [facility] in December with acute CHF.  An echo Doppler study demonstrated an EF of 40-45%.  She had mild troponin elevation felt to be due to demand ischemia.  The day after discharge, she was admitted on December 13 through 01/27/2015 with atrial fibrillation, rapid ventricular response.  An inpatient Myoview study demonstrated possible anterior scar with minimal peri-infarction ischemia.  Cardiac catheterization revealed mild nonobstructive CAD.  She was anemic but was cleared for anticoagulation by GI.  The plan was to proceed with cardioversion after 4 weeks of uninterrupted anticoagulation if she remained in atrial fibrillation.  She was seen in the office in follow-up and was in sinus rhythm., but due to episodes of recurrent rectal bleeding herr eliquis had to be stopped.  She tried to resume eliquis and again develop recurrent rectal bleeding.  Presently, she denies any episodes of chest pain.  She is unaware of any recurrent incidences of atrial fibrillation.  She has been taking furosemide 20 mg every other day.  40 minute leg swelling, losartan at low-dose 25 mg, long, tartrate 50 mg twice a day.  She also has been on omeprazole 40 mg daily.  Hyperlipidemia has been treated with simvastatin 40 mg.  She presents for evaluation.  Past Medical History  Diagnosis Date  . Colon polyps   . Depression   . Hypothyroidism   . Osteoarthritis     hands  . Peripheral vascular disease  ([place])   . Degenerative disk disease     Thoracic spine  . 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 Cancer ([place])     skin cancer- basal cell on arm / colon 2011  . Lymphoma ([place])   . Colon cancer ([place])     2011, s/p surgery, in remission  . Neuropathy due to chemotherapeutic drug ([place]) 06/19/2014  . hodgkins lymphoma   . Heart murmur   . GERD (gastroesophageal reflux disease)   . Anemia     hx blood tx  . Pneumonia   . 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 Benign essential HTN   . Chronic systolic CHF (congestive heart failure), NYHA class 2 ([place]) 01/2015  . NICM (nonischemic cardiomyopathy) ([place]) 01/2015  . PAF (paroxysmal atrial fibrillation) ([place])   . Generalized weakness 05/11/2015    Past Surgical History  Procedure Laterality Date  . Cholecystectomy    . Splenectomy      lymphoma  . Breast biopsy  1996  . Plantar fascia release    . Ventral hernia repair  1998  . Tendon release      Right thumb   . Colectomy  8/11  . Vaginal hysterectomy    . Bone marrow biopsy  05/27/13  . Cataract extraction, bilateral  2016  . Port-a-cath insertion    . Colonoscopy w/ biopsies    . Dg biopsy lung    . Video  bronchoscopy with endobronchial ultrasound N/A 10/17/2014    Procedure: VIDEO BRONCHOSCOPY WITH ENDOBRONCHIAL ULTRASOUND;  Surgeon: Javier Glazier, MD;  Location: [place];  Service: Thoracic;  Laterality: N/A;  . Video bronchoscopy with endobronchial ultrasound N/A 11/01/2014    Procedure: VIDEO BRONCHOSCOPY WITH ENDOBRONCHIAL ULTRASOUND;  Surgeon: Grace Isaac, MD;  Location: [place];  Service: Thoracic;  Laterality: N/A;  . Mediastinoscopy N/A 11/01/2014    Procedure: MEDIASTINOSCOPY;  Surgeon: Grace Isaac, MD;  Location: [place];  Service: Thoracic;  Laterality: N/A;  . Lymph node biopsy N/A 11/01/2014    Procedure: MEDIASTINAL LYMPH NODE BIOPSY;  Surgeon: Grace Isaac, MD;   Location: [place];  Service: Thoracic;  Laterality: N/A;  . Cardiac catheterization N/A 01/25/2015    Procedure: Left Heart Cath and Coronary Angiography;  Surgeon: Peter M Martinique, MD;  Location: [facility];  Service: Cardiovascular;  Laterality: N/A;    Allergies  Allergen Reactions  . Achromycin [Tetracycline Hcl] Other (See Comments)    Pt does not remember reaction  . Allopurinol Other (See Comments)    REACTION: Unsure of reaction happene years ago  . Astelin [Azelastine Hcl] Other (See Comments)    Reaction unknown  . Cephalexin Other (See Comments)    REACTION: unsure of reaction happened yrs ago.  . Codeine Other (See Comments)    REACTION: abd. pain  . Lisinopril Cough  . Meloxicam Other (See Comments)    REACTION: GI symptoms  . Minocycline Other (See Comments)    Abdominal pain  . Nabumetone Other (See Comments)    REACTION: reaction not known  . Nyquil [Pseudoeph-Doxylamine-Dm-Apap] Hives  . Penicillins Other (See Comments)    Reaction unknown Has patient had a PCN reaction causing immediate rash, facial/tongue/throat swelling, SOB or lightheadedness with hypotension: unsure reaction unknown Has patient had a PCN reaction causing severe rash involving mucus membranes or skin necrosis: unsure reaction unknown Has patient had a PCN reaction that required hospitalization no Has patient had a PCN reaction occurring within the last 10 years: no If all of the above answers are "NO", then may proceed with Cephalosporin use.  . Sulfa Antibiotics Other (See Comments)    Gi side eff   . Zolpidem Tartrate Other (See Comments)    REACTION: feels too drugged  . Buspar [Buspirone Hcl] Other (See Comments)    Dizziness, and not as effective for anxiety  . Ciprofloxacin Rash    Current Outpatient Prescriptions  Medication Sig Dispense Refill  . acetaminophen (TYLENOL) 650 MG CR tablet Take 650 mg by mouth every 8 (eight) hours as needed (For back pain.).    Marland Kitchen acyclovir  (ZOVIRAX) 400 MG tablet Take 1 tablet (400 mg total) by mouth daily. 90 tablet 3  . aspirin 81 MG tablet Take 81 mg by mouth daily.    . Calcium Carbonate-Vitamin D (CALCIUM 600+D) 600-400 MG-UNIT per tablet Take 1 tablet by mouth every evening.     . celecoxib (CELEBREX) 200 MG capsule Take 1 capsule (200 mg total) by mouth daily as needed for moderate pain. 90 capsule 3  . Cholecalciferol (VITAMIN D-3) 1000 UNITS CAPS Take 1,000 Units by mouth daily.    Marland Kitchen docusate sodium (COLACE) 100 MG capsule Take 100 mg by mouth at bedtime.    Marland Kitchen doxycycline (VIBRA-TABS) 100 MG tablet Take 1 tablet (100 mg total) by mouth 2 (two) times daily. Take with non dairy food 20 tablet 0  . estradiol (ESTRACE) 1 MG tablet Take 1  tablet (1 mg total) by mouth daily. 90 tablet 3  . fexofenadine (ALLEGRA) 180 MG tablet Take 180 mg by mouth daily. Reported on 04/23/2015    . FLUoxetine (PROZAC) 20 MG capsule Take 1 capsule (20 mg total) by mouth daily. 90 capsule 3  . fluticasone (FLONASE) 50 MCG/ACT nasal spray Place 2 sprays into both nostrils daily. 48 g 3  . furosemide (LASIX) 20 MG tablet Take 1 tablet (20 mg total) by mouth every other day. Take 1 tab by mouth daily for weight gain. 90 tablet 3  . gabapentin (NEURONTIN) 300 MG capsule Take 1 capsule (300 mg total) by mouth 2 (two) times daily. 180 capsule 3  . levothyroxine (SYNTHROID, LEVOTHROID) 112 MCG tablet Take 1 tablet (112 mcg total) by mouth daily before breakfast. 90 tablet 3  . lidocaine-prilocaine (EMLA) cream Apply 1 application topically as needed (For port-a-cath.). Reported on 02/22/2015    . losartan (COZAAR) 25 MG tablet Take 1 tablet (25 mg total) by mouth daily. 90 tablet 3  . metoprolol (LOPRESSOR) 50 MG tablet TAKE 1 TABLET (50 MG TOTAL) BY MOUTH 2 (TWO) TIMES DAILY. 180 tablet 3  . Omega-3 350 MG CAPS Take 1 capsule by mouth daily. Reported on 02/22/2015    . omeprazole (PRILOSEC) 40 MG capsule Take 1 capsule (40 mg total) by mouth daily. 90 capsule  3  . ondansetron (ZOFRAN) 8 MG tablet Take 8 mg by mouth every 6 (six) hours as needed for nausea or vomiting. Reported on 03/05/2015    . polyethylene glycol (MIRALAX / GLYCOLAX) packet Take 17 g by mouth at bedtime.    . prochlorperazine (COMPAZINE) 10 MG tablet Take 10 mg by mouth every 6 (six) hours as needed for nausea or vomiting. Reported on 03/05/2015    . psyllium (METAMUCIL) 58.6 % packet Take 1-3 packets by mouth at bedtime.     . simvastatin (ZOCOR) 40 MG tablet Take 1 tablet (40 mg total) by mouth at bedtime. 90 tablet 0  . vitamin E (VITAMIN E) 400 UNIT capsule Take 400 Units by mouth daily. Reported on 03/05/2015     No current facility-administered medications for this visit.    Social History   Social History  . Marital Status: Married    Spouse Name: N/A  . Number of Children: 2  . Years of Education: N/A   Occupational History  . Retired     Social History Main Topics  . Smoking status: Never Smoker   . Smokeless tobacco: Never Used     Comment: Second-hand exposure through father.  . Alcohol Use: No  . Drug Use: No  . Sexual Activity: No   Other Topics Concern  . Not on file   Social History Narrative   Originally from [place]. Always lived in [place]. Previously has traveled to [facility], [place] & up [employer] to [place]. No international travel. No pets currently. Remote parakeet exposure with her children. 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    Lives at home with husband. Weakness due to chemo, but otherwise independant       Family History  Problem Relation Age of Onset  . Coronary artery disease Mother   . Diabetes Mother   . Diabetes Brother   . Fibromyalgia Daughter     chronic pain   . COPD Daughter   . Colon cancer Neg Hx   . Colon polyps Neg Hx   . Stomach cancer Neg Hx   . Rectal cancer Neg  Hx   . Ulcerative colitis Neg Hx   . Crohn's disease Neg Hx   . Asthma Daughter   . Rheum arthritis Brother   . Clotting disorder Brother     . Alcohol abuse Sister     ROS General: Negative; No fevers, chills, or night sweats HEENT: Negative; No changes in vision or hearing, sinus congestion, difficulty swallowing Pulmonary: Negative; No cough, wheezing, shortness of breath, hemoptysis Cardiovascular: See HPI:  GI: Negative; No nausea, vomiting, diarrhea, or abdominal pain GU: Negative; No dysuria, hematuria, or difficulty voiding Musculoskeletal: Negative; no myalgias, joint pain, or weakness Hematologic: Negative; no easy bruising, bleeding Endocrine: Negative; no heat/cold intolerance; no diabetes, Neuro: Negative; no changes in balance, headaches Skin: Negative; No rashes or skin lesions Psychiatric: Negative; No behavioral problems, depression Sleep: Negative; No snoring,  daytime sleepiness, hypersomnolence, bruxism, restless legs, hypnogognic hallucinations. Other comprehensive 14 point system review is negative   Physical Exam BP 118/66 mmHg  Pulse 56  Ht _0  (1.651 m)  Wt 242 lb 12.8 oz (110.133 kg)  BMI 40.40 kg/m2  LMP 02/10/1970 Wt Readings from Last 3 Encounters:  06/21/15 242 lb 12.8 oz (110.133 kg)  06/08/15 241 lb 4 oz (109.43 kg)  05/11/15 241 lb 14.4 oz (109.725 kg)   General: Alert, oriented, no distress.  Skin: normal turgor, no rashes, warm and dry HEENT: Normocephalic, atraumatic. Pupils equal round and reactive to light; sclera anicteric; extraocular muscles intact, No lid lag; Nose without nasal septal hypertrophy; Mouth/Parynx benign; Mallinpatti scale 3 Neck: No JVD, no carotid bruits; normal carotid upstroke Lungs: clear to ausculatation and percussion bilaterally; no wheezing or rales, normal inspiratory and expiratory effort Chest wall: without tenderness to palpitation Heart: PMI not displaced, RRR, s1 s2 normal, faint 1/6 sem systolic murmur, No diastolic murmur, no rubs, gallops, thrills, or heaves Abdomen: soft, nontender; no hepatosplenomehaly, BS+; abdominal aorta nontender and  not dilated by palpation. Back: no CVA tenderness Pulses: 2+  Musculoskeletal: full range of motion, normal strength, no joint deformities Extremities: Pulses 2+, no clubbing cyanosis or edema, Homan's sign negative  Neurologic: grossly nonfocal; Cranial nerves grossly wnl Psychologic: Normal mood and affect   ECG (independently read by me): Sinus bradycardia 56 bpm with mild sinus arrhythmia.  Left bundle branch block.).  Axis.  LABS:  BMP Latest Ref Rng 05/11/2015 04/09/2015 03/19/2015  Glucose 70 - 140 mg/dl 191(H) 125(H) 64(L)  BUN 7.0 - 26.0 mg/dL 13._1 Creatinine 0.6 - 1.1 mg/dL 1.0 0.95(H) 0.83  Sodium 136 - 145 mEq/L 140 140 140  Potassium 3.5 - 5.1 mEq/L 3.6 4.2 4.5  Chloride 98 - 110 mmol/L - 102 104  CO2 22 - 29 mEq/L 26 33(H) 28  Calcium 8.4 - 10.4 mg/dL 9.1 9.3 9.6     Hepatic Function Latest Ref Rng 05/11/2015 03/12/2015 03/05/2015  Total Protein 6.4 - 8.3 g/dL 6.0(L) 6.1(L) 6.1(L)  Albumin 3.5 - 5.0 g/dL 3.6 3.7 3.7  AST 5 - 34 U/L _2 ALT 0 - 55 U/L _3 Alk Phosphatase 40 - 150 U/L 142 167(H) 175(H)  Total Bilirubin 0.20 - 1.20 mg/dL <0.30 0.43 0.43    CBC Latest Ref Rng 05/11/2015 03/12/2015 03/05/2015  WBC 3.9 - 10.3 10e3/uL 5.8 5.1 6.4  Hemoglobin 11.6 - 15.9 g/dL 11.9 11.7 11.4(L)  Hematocrit 34.8 - 46.6 % 36.1 35.5 34.9  Platelets 145 - 400 10e3/uL 202 246 253   Lab Results  Component Value Date  MCV 95.5 05/11/2015   MCV 97.7 03/12/2015   MCV 97.0 03/05/2015    Lab Results  Component Value Date   TSH 0.54 04/04/2015    BNP    Component Value Date/Time   BNP 622.7* 01/23/2015 1602    ProBNP    Component Value Date/Time   PROBNP 760.3* 04/23/2013 1157     Lipid Panel     Component Value Date/Time   CHOL 163 01/25/2015 0442   TRIG 121 01/25/2015 0442   HDL 44 01/25/2015 0442   CHOLHDL 3.7 01/25/2015 0442   VLDL 24 01/25/2015 0442   LDLCALC 95 01/25/2015 0442   LDLDIRECT 133.0 09/17/2012 1323     RADIOLOGY: No  results found.    ASSESSMENT AND PLAN: Diamond Boyd is a 75 year old female who has a history of lung CA status post resection, Hodgkin's lymphoma, malignant B-cell follicular lymphoma, neuropathy, and hypothyroidism and had recently developed atrial fibrillation.  She is now maintaining sinus rhythm.  She had been on anticoagulation with eloquence but due to recurrent episodes of rectal bleeding.  This had to be discontinued.  Her chads fast score is L at dated and without bleeding.  She is a candidate for anticoagulation therapy.  We discussed the risks, benefits of resumption of anticoagulation therapy versus aspirin alone.  Her blood pressure today is stable on current therapy.  She is on simvastatin for hyperlipidemia.  She denies myalgias.  ECG suggests left bundle branch block.  She required transfusions with packed red blood cells due to her significant anemia from her rectal bleeding.  I suggested she try to reduce Celebrex, which she has been taking intermittently.  She will continue to take aspirin 81 mg.  I will see her in 6 months for cardiology reevaluation or sooner if problems arise.  Time spent: 25 minutes  Troy Sine, MD, [facility]  06/23/2015 6:06 PM

## 2015-06-25 ENCOUNTER — Ambulatory Visit: Payer: Medicare Other | Attending: Hematology and Oncology

## 2015-06-25 DIAGNOSIS — M6281 Muscle weakness (generalized): Secondary | ICD-10-CM | POA: Diagnosis not present

## 2015-06-25 NOTE — Patient Instructions (Signed)
[email or web address] Calf stretch 30s x 2 Sit to stand 5x 3x/day  WALKING  Walking is a great form of exercise to increase your strength, endurance and overall fitness.  A walking program can help you start slowly and gradually build endurance as you go.  Everyone's ability is different, so each person's starting point will be different.  You do not have to follow them exactly.  The are just samples. You should simply find out what's right for you and stick to that program.   In the beginning, you'll start off walking 2-3 times a day for short distances.  As you get stronger, you'll be walking further at just 1-2 times per day.  A. You Can Walk For A Certain Length Of Time Each Day    Walk 5 minutes 3 times per day.  Increase 2 minutes every 2 days (3 times per day).  Work up to 25-30 minutes (1-2 times per day).   Example:   Day 1-2 5 minutes 3 times per day   Day 7-8 12 minutes 2-3 times per day   Day 13-14 25 minutes 1-2 times per day  B. You Can Walk For a Certain Distance Each Day     Distance can be substituted for time.    Example:   3 trips to mailbox (at road)   3 trips to corner of block   3 trips around the block

## 2015-06-25 NOTE — Therapy (Signed)
[place] MAIN [facility] SERVICES [address] [place], [place], [address] Phone: [phone]   Fax:  [phone]  Physical Therapy Evaluation  Patient Details  Name: Diamond Boyd MRN: [id number] Date of Birth: December 10, 1940 Referring Provider: Alvy Bimler  Encounter Date: 06/25/2015      PT End of Session - 06/25/15 1359    Visit Number 1   Number of Visits 13   Date for PT Re-Evaluation 07/23/15   Authorization Type 1/10   PT Start Time 4174   PT Stop Time 1355   PT Time Calculation (min) 50 min   Activity Tolerance Patient tolerated treatment well   Behavior During Therapy [facility] for tasks assessed/performed      Past Medical History  Diagnosis Date  . Colon polyps   . Depression   . Hypothyroidism   . Osteoarthritis     hands  . Peripheral vascular disease ([place])   . Degenerative disk disease     Thoracic spine  . 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 Cancer ([place])     skin cancer- basal cell on arm / colon 2011  . Lymphoma ([place])   . Colon cancer ([place])     2011, s/p surgery, in remission  . Neuropathy due to chemotherapeutic drug ([place]) 06/19/2014  . hodgkins lymphoma   . Heart murmur   . GERD (gastroesophageal reflux disease)   . Anemia     hx blood tx  . Pneumonia   . 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 Benign essential HTN   . Chronic systolic CHF (congestive heart failure), NYHA class 2 ([place]) 01/2015  . NICM (nonischemic cardiomyopathy) ([place]) 01/2015  . PAF (paroxysmal atrial fibrillation) ([place])   . Generalized weakness 05/11/2015    Past Surgical History  Procedure Laterality Date  . Cholecystectomy    . Splenectomy      lymphoma  . Breast biopsy  1996  . Plantar fascia release    . Ventral hernia repair  1998  . Tendon release      Right thumb   . Colectomy  8/11  . Vaginal hysterectomy    . Bone marrow biopsy   05/27/13  . Cataract extraction, bilateral  2016  . Port-a-cath insertion    . Colonoscopy w/ biopsies    . Dg biopsy lung    . Video bronchoscopy with endobronchial ultrasound N/A 10/17/2014    Procedure: VIDEO BRONCHOSCOPY WITH ENDOBRONCHIAL ULTRASOUND;  Surgeon: Javier Glazier, MD;  Location: [place];  Service: Thoracic;  Laterality: N/A;  . Video bronchoscopy with endobronchial ultrasound N/A 11/01/2014    Procedure: VIDEO BRONCHOSCOPY WITH ENDOBRONCHIAL ULTRASOUND;  Surgeon: Grace Isaac, MD;  Location: [place];  Service: Thoracic;  Laterality: N/A;  . Mediastinoscopy N/A 11/01/2014    Procedure: MEDIASTINOSCOPY;  Surgeon: Grace Isaac, MD;  Location: [place];  Service: Thoracic;  Laterality: N/A;  . Lymph node biopsy N/A 11/01/2014    Procedure: MEDIASTINAL LYMPH NODE BIOPSY;  Surgeon: Grace Isaac, MD;  Location: [place];  Service: Thoracic;  Laterality: N/A;  . Cardiac catheterization N/A 01/25/2015    Procedure: Left Heart Cath and Coronary Angiography;  Surgeon: Peter M Martinique, MD;  Location: [place] CV [facility];  Service: Cardiovascular;  Laterality: N/A;    There were no vitals filed for this visit.       Subjective Assessment - 06/25/15 1311  Subjective pt reports she is a current cancer patient for Hogkins Lymphoma. she reports she has had pain in her legs R >L she describes the pain and "drawing up/ numb". she reports the pain fluctuates some, but it it always hurting. she reports she feels like her legs are getting weaker and her endurance is getting worse.  pt reprots her legs do get swollen.    How long can you stand comfortably? 37mn   Patient Stated Goals get stronger   Currently in Pain? Yes   Pain Score 5    Pain Location --  bilateral lower legs   Pain Descriptors / Indicators Aching            [facility] PT Assessment - 06/25/15 1314    Assessment   Medical Diagnosis neuropathic leg pain   Referring Provider Gorsuch   Onset Date/Surgical Date 06/25/14    Hand Dominance Right   Prior Therapy no   Precautions   Precautions Fall   Restrictions   Weight Bearing Restrictions No   Balance Screen   Has the patient fallen in the past 6 months No   Has the patient had a decrease in activity level because of a fear of falling?  Yes   Is the patient reluctant to leave their home because of a fear of falling?  No   Home EEc[place]residence   Living Arrangements Spouse/significant other   Available Help at Discharge Family   Type of HTurnerto enter   Entrance Stairs-Number of Steps [place]- 2 wheels;Tub bench;Bedside commode;Walker - 4 wheels;Cane - single point   Prior Function   Level of Independence Independent with community mobility without device          POSTURE/OBSERVATION: No acute distress. Forward head, rounded shoulders  PROM/AROM: WFL of all extremities   STRENGTH:  Graded on a 0-5 scale Muscle Group Left Right  Shoulder flex 4 4  Shoulder Abd 4 4  Shoulder Ext    Rectus abdominis  2-   Elbow 4 4  Wrist/hand  4 4  Hip Flex 4 4  Hip Abd 4 4  Hip Add 3 3  Hip Ext 3 3  Hip IR/ER    Knee Flex 4 4  Knee Ext 4 4  Ankle DF 4 4  Ankle PF 4 4   SENSATION:   impared light touch, deep pressure below knees BLE   FUNCTIONAL MOBILITY: Increased time needed for bed mobility and transfers  GAIT: Wide BOS, short step length, slowed cadence, reduced arm swing  OUTCOME MEASURES: TEST Outcome Interpretation  5 times sit<>stand 28sec >>77yo, >15 sec indicates increased risk for falls  10 meter walk test          0.8       m/s <1.0 m/s indicates increased risk for falls; limited community ambulator  Timed up and Go                 sec <14 sec indicates increased risk for falls  6 minute walk test                Feet 1000 feet is community aWater quality scientist <36/56 (100% risk for falls), 37-45 (80%  risk for falls); 46-51 (>50% risk for falls); 52-55 (lower risk <25% of falls)  PT Education - 06/25/15 1359    Education provided Yes   Education Details plan of care, exam findings   Person(s) Educated Patient   Methods Explanation   Comprehension Verbalized understanding             PT Long Term Goals - 06/25/15 1513    PT LONG TERM GOAL #1   Title pt will perform 5x sit to stand in <15s to improve LE strength    Baseline 28s   Time 4   Period Weeks   Status New   PT LONG TERM GOAL #2   Title pt will ambulate at 1.88ms to improve her community mobility    Baseline 0.878m   Time 4   Period Weeks   Status New   PT LONG TERM GOAL #3   Title pt will improve 79m32mwalk by 150f61fmonstrating improved activity tolerance.    Baseline 740ft61fh 1 seated rest break    Time 4   Period Weeks   Status New               Plan - 06/25/15 1501    Clinical Impression Statement pt presents as 74 y/34female currently being treated for lymphoma, history of neuropathy likley due to chemo, reporting LE pain, weakness, reduced endurance and imbalance. Pt pain treatment with Electrical stimulation / TENS is contraindicated due to malignancy and sensation loss. pt however, would benefit from skilled PT services to improved her LE strength, muscular endurane, cardiovascular endurance and flexibility to maximzie her activity tolerance as she has had a reduction in mobility recently.    Rehab Potential Good   Clinical Impairments Affecting Rehab Potential co-morbidities   PT Frequency 2x / week   PT Duration 4 weeks   PT Treatment/Interventions Neuromuscular re-education;Balance training;Therapeutic exercise;Therapeutic activities;Functional mobility training;Stair training;Gait training;Manual lymph drainage;Passive range of motion   PT Next Visit Plan HEP   Consulted and Agree with Plan of Care Patient      Patient will benefit from skilled  therapeutic intervention in order to improve the following deficits and impairments:  Decreased strength, Difficulty walking, Impaired flexibility, Pain, Decreased coordination, Decreased endurance, Decreased balance, Impaired sensation, Decreased activity tolerance  Visit Diagnosis: Muscle weakness (generalized) - Plan: PT plan of care cert/re-cert      G-Codes - 06/1555/01/77    Functional Assessment Tool Used 5xsittostand/10mwa28mminwalk   Functional Limitation Mobility: Walking and moving around   Mobility: Walking and Moving Around Current Status ([phone] 20 percent but less than 40 percent impaired, limited or restricted   Mobility: Walking and Moving Around Goal Status ([id number] 1 percent but less than 20 percent impaired, limited or restricted       Problem List Patient Active Problem List   Diagnosis Date Noted  . Generalized weakness 05/11/2015  . Benign essential HTN   . PAF (paroxysmal atrial fibrillation) (HCC)  [place] hemorrhoids 02/21/2015  . Rectal bleeding 02/06/2015  . 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 Thrombocytopenia (HCC) 1[place]9/2016  . AKI (acute kidney injury) (HCC)  [place]levated troponin   . Atrial fibrillation with rapid ventricular response (HCC) 1[place]3/2016  . New onset a-fib (HCC) 1[place]3/2016  . Hypertension 01/23/2015  . CHF (congestive heart failure) (HCC) 1[place]1/2016  . Chronic systolic CHF (congestive heart failure), NYHA class 2 (HCC) 1[place]1/2016  . Fever, unspecified 01/10/2015  . Immunocompromised status associated with infection (HCC) 1[place]8/2016  . 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 Preventive measure 11/14/2014  . Parotid mass 11/14/2014  . Chronic lower back pain 09/28/2014  . Mediastinal lymphadenopathy 09/27/2014  . Neuropathy due to chemotherapeutic drug ([place]) 06/19/2014  .  Peripheral neuropathy due to chemotherapy ([place]) 04/03/2014  . Trochanteric bursitis of right hip 04/03/2014  . Anemia in chronic illness 02/21/2014  . Elevated liver enzymes 02/21/2014  . Urinary tract infection 11/18/2013  . Candidal intertrigo 09/23/2013  . Atrophic vaginitis 09/23/2013  . Drug-induced neutropenia ([place]) 09/01/2013  . History of colon cancer 06/17/2013  . History of hodgkin's lymphoma 05/20/2013  . Dysuria 05/20/2013  . Conjunctivitis, acute 04/27/2013  . Cold sore 04/27/2013  . Left temporal headache 03/11/2013  . Hyperglycemia 03/11/2013  . Acute maxillary sinusitis 02/09/2013  . Other malaise and fatigue 01/04/2013  . Herpes zoster 11/22/2012  . Vaginal pain 11/22/2012  . Cystocele 09/22/2012  . Encounter for Medicare annual wellness exam 09/17/2012  . Left knee pain 09/10/2012  . Thoracic back pain 06/01/2012  . Skin lesion of face 05/17/2012  . Other screening mammogram 07/22/2011  . Routine gynecological examination 07/22/2011  . Colon cancer screening 07/22/2011  . History of anemia 05/06/2011  . 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 Hypothyroid 02/24/2011  . Varicose veins 02/24/2011  . Elevated blood pressure 11/20/2010  . Obesity 11/20/2010  . 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 Other chronic sinusitis 07/03/2009  . THROAT PAIN, CHRONIC 07/03/2009  . GERD 12/20/2008  . Anxiety state, unspecified 08/22/2008  . MENOPAUSAL SYNDROME 07/13/2007  . HYPERCHOLESTEROLEMIA, PURE 07/08/2006  . Allergic rhinitis 07/08/2006  . OVERACTIVE BLADDER 07/08/2006  . 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 Depression 06/25/2006  . PERIPHERAL VASCULAR DISEASE 06/25/2006  . OSTEOARTHRITIS 06/25/2006  . LEG EDEMA 06/25/2006  . SKIN CANCER, HX OF 06/25/2006  . COLONIC POLYPS, HX OF 06/25/2006   Gorden Harms. Bunnie Lederman, PT, DPT [phone]  Diamond Boyd 06/25/2015, 5:01 PM  [place] MAIN [facility] SERVICES [address] [place], [place], [address] Phone: [phone]   Fax:  [phone]  Name: Diamond Boyd MRN: [id number] Date of Birth: [birth year]

## 2015-07-02 ENCOUNTER — Ambulatory Visit: Payer: Medicare Other

## 2015-07-02 DIAGNOSIS — M6281 Muscle weakness (generalized): Secondary | ICD-10-CM | POA: Diagnosis not present

## 2015-07-02 NOTE — Patient Instructions (Signed)
[email or web address] Standing mini squat 2x10; standing ankle DF/PF 2x10; standing hip abd 2x10; standing march 2x10; standing hip extension 2x10;

## 2015-07-02 NOTE — Therapy (Signed)
[place] MAIN [facility] SERVICES [address] [place], [place], [address] Phone: [phone]   Fax:  [phone]  Physical Therapy Treatment  Patient Details  Name: Diamond Boyd MRN: [id number] Date of Birth: Jun 23, 1940 Referring Provider: Alvy Bimler  Encounter Date: 07/02/2015      PT End of Session - 07/02/15 1246    Visit Number 2   Number of Visits 13   Date for PT Re-Evaluation 07/23/15   Authorization Type 2/10   PT Start Time 1115   PT Stop Time 1200   PT Time Calculation (min) 45 min   Equipment Utilized During Treatment Gait belt   Activity Tolerance Patient tolerated treatment well   Behavior During Therapy [facility] for tasks assessed/performed      Past Medical History  Diagnosis Date  . Colon polyps   . Depression   . Hypothyroidism   . Osteoarthritis     hands  . Peripheral vascular disease ([place])   . Degenerative disk disease     Thoracic spine  . 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 Cancer ([place])     skin cancer- basal cell on arm / colon 2011  . Lymphoma ([place])   . Colon cancer ([place])     2011, s/p surgery, in remission  . Neuropathy due to chemotherapeutic drug ([place]) 06/19/2014  . hodgkins lymphoma   . Heart murmur   . GERD (gastroesophageal reflux disease)   . Anemia     hx blood tx  . Pneumonia   . 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 Benign essential HTN   . Chronic systolic CHF (congestive heart failure), NYHA class 2 ([place]) 01/2015  . NICM (nonischemic cardiomyopathy) ([place]) 01/2015  . PAF (paroxysmal atrial fibrillation) ([place])   . Generalized weakness 05/11/2015    Past Surgical History  Procedure Laterality Date  . Cholecystectomy    . Splenectomy      lymphoma  . Breast biopsy  1996  . Plantar fascia release    . Ventral hernia repair  1998  . Tendon release      Right thumb   . Colectomy  8/11  .  Vaginal hysterectomy    . Bone marrow biopsy  05/27/13  . Cataract extraction, bilateral  2016  . Port-a-cath insertion    . Colonoscopy w/ biopsies    . Dg biopsy lung    . Video bronchoscopy with endobronchial ultrasound N/A 10/17/2014    Procedure: VIDEO BRONCHOSCOPY WITH ENDOBRONCHIAL ULTRASOUND;  Surgeon: Javier Glazier, MD;  Location: [place];  Service: Thoracic;  Laterality: N/A;  . Video bronchoscopy with endobronchial ultrasound N/A 11/01/2014    Procedure: VIDEO BRONCHOSCOPY WITH ENDOBRONCHIAL ULTRASOUND;  Surgeon: Grace Isaac, MD;  Location: [place];  Service: Thoracic;  Laterality: N/A;  . Mediastinoscopy N/A 11/01/2014    Procedure: MEDIASTINOSCOPY;  Surgeon: Grace Isaac, MD;  Location: [place];  Service: Thoracic;  Laterality: N/A;  . Lymph node biopsy N/A 11/01/2014    Procedure: MEDIASTINAL LYMPH NODE BIOPSY;  Surgeon: Grace Isaac, MD;  Location: [place];  Service: Thoracic;  Laterality: N/A;  . Cardiac catheterization N/A 01/25/2015    Procedure: Left Heart Cath and Coronary Angiography;  Surgeon: Peter M Martinique, MD;  Location: [place] CV LAB;  Service: Cardiovascular;  Laterality: N/A;    There were no vitals filed for this visit.  Subjective Assessment - 07/02/15 1245    Subjective pt reports she is doing her walking program. she says its hard, but shes getting it done.    How long can you stand comfortably? 362mn   Patient Stated Goals get stronger   Currently in Pain? Yes   Pain Score 4    Pain Location --  RLE entire leg   Pain Descriptors / Indicators Aching   Pain Type Neuropathic pain       therex: Nustep : 3 x 5 min no charge warm up     [facility] - 07/02/15 0001    Standardized Balance Assessment   Standardized Balance Assessment Berg Balance Test   Berg Balance Test   Sit to Stand Able to stand without using hands and stabilize independently   Standing Unsupported Able to stand safely 2 minutes   Sitting with Back  Unsupported but Feet Supported on Floor or Stool Able to sit safely and securely 2 minutes   Stand to Sit Sits safely with minimal use of hands   Transfers Able to transfer safely, minor use of hands   Standing Unsupported with Eyes Closed Able to stand 10 seconds with supervision   Standing Ubsupported with Feet Together Able to place feet together independently and stand for 1 minute with supervision   From Standing, Reach Forward with Outstretched Arm Can reach confidently >25 cm (10")   From Standing Position, Pick up Object from Floor Able to pick up shoe, needs supervision   From Standing Position, Turn to Look Behind Over each Shoulder Looks behind one side only/other side shows less weight shift   Turn 360 Degrees Able to turn 360 degrees safely but slowly   Standing Unsupported, Alternately Place Feet on Step/Stool Able to complete >2 steps/needs minimal assist   Standing Unsupported, One Foot in Front Able to plae foot ahead of the other independently and hold 30 seconds   Standing on One Leg Tries to lift leg/unable to hold 3 seconds but remains standing independently   Total Score 43     Standing mini squat 2x10; standing ankle DF/PF 2x10; standing hip abd 2x10; standing march 2x10; standing hip extension 2x10;    Pt requires min verbal and tactile cues for proper exercise performance                         PT Education - 07/02/15 1246    Education provided Yes   Education Details HEP progression   Person(s) Educated Patient   Methods Explanation;Tactile cues;Verbal cues;Handout   Comprehension Returned demonstration;Verbalized understanding             PT Long Term Goals - 06/25/15 1513    PT LONG TERM GOAL #1   Title pt will perform 5x sit to stand in <15s to improve LE strength    Baseline 28s   Time 4   Period Weeks   Status New   PT LONG TERM GOAL #2   Title pt will ambulate at 1.022m to improve her community mobility    Baseline 0.62m46m   Time 4   Period Weeks   Status New   PT LONG TERM GOAL #3   Title pt will improve 6mi21malk by 150ft11fonstrating improved activity tolerance.    Baseline 740ft 31f 1 seated rest break    Time 4   Period Weeks   Status New  Plan - 07/02/15 1246    Clinical Impression Statement pt has done well with compliance of her walking program. progressed HEP LE strengthening today with faitgue, but good performance with rest breaks. pt scored 43/56 on Berg balance test as a high risk for falls, indicating again a continued need for skilled PT services to address LE strength, gait and balance to maximize function. Additionally pt has been visibly drowsy last 2 visits, she reports she thinks is a side effect of a medication. Pt urged to speak with MD regarding this next week at her visit for QOL, but also for assessing risk of falls.    Rehab Potential Good   Clinical Impairments Affecting Rehab Potential co-morbidities   PT Frequency 2x / week   PT Duration 4 weeks   PT Treatment/Interventions Neuromuscular re-education;Balance training;Therapeutic exercise;Therapeutic activities;Functional mobility training;Stair training;Gait training;Manual lymph drainage;Passive range of motion   PT Next Visit Plan HEP   Consulted and Agree with Plan of Care Patient      Patient will benefit from skilled therapeutic intervention in order to improve the following deficits and impairments:  Decreased strength, Difficulty walking, Impaired flexibility, Pain, Decreased coordination, Decreased endurance, Decreased balance, Impaired sensation, Decreased activity tolerance  Visit Diagnosis: Muscle weakness (generalized)     Problem List Patient Active Problem List   Diagnosis Date Noted  . Generalized weakness 05/11/2015  . Benign essential HTN   . PAF (paroxysmal atrial fibrillation) ([place])   . Internal hemorrhoids 02/21/2015  . Rectal bleeding 02/06/2015  . Anemia in neoplastic  disease 01/29/2015  . Thrombocytopenia ([place]) 01/29/2015  . AKI (acute kidney injury) ([place])   . Elevated troponin   . Atrial fibrillation with rapid ventricular response ([place]) 01/23/2015  . New onset a-fib ([place]) 01/23/2015  . Hypertension 01/23/2015  . CHF (congestive heart failure) ([place]) 01/21/2015  . Chronic systolic CHF (congestive heart failure), NYHA class 2 ([place]) 01/11/2015  . Fever, unspecified 01/10/2015  . Immunocompromised status associated with infection ([place]) 01/08/2015  . 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 Preventive measure 11/14/2014  . Parotid mass 11/14/2014  . Chronic lower back pain 09/28/2014  . Mediastinal lymphadenopathy 09/27/2014  . Neuropathy due to chemotherapeutic drug ([place]) 06/19/2014  . Peripheral neuropathy due to chemotherapy ([place]) 04/03/2014  . Trochanteric bursitis of right hip 04/03/2014  . Anemia in chronic illness 02/21/2014  . Elevated liver enzymes 02/21/2014  . Urinary tract infection 11/18/2013  . [place] intertrigo 09/23/2013  . Atrophic vaginitis 09/23/2013  . Drug-induced neutropenia ([place]) 09/01/2013  . History of colon cancer 06/17/2013  . History of hodgkin's lymphoma 05/20/2013  . Dysuria 05/20/2013  . Conjunctivitis, acute 04/27/2013  . Cold sore 04/27/2013  . Left temporal headache 03/11/2013  . Hyperglycemia 03/11/2013  . Acute maxillary sinusitis 02/09/2013  . Other malaise and fatigue 01/04/2013  . Herpes zoster 11/22/2012  . Vaginal pain 11/22/2012  . Cystocele 09/22/2012  . Encounter for Medicare annual wellness exam 09/17/2012  . Left knee pain 09/10/2012  . Thoracic back pain 06/01/2012  . Skin lesion of face 05/17/2012  . Other screening mammogram 07/22/2011  . Routine gynecological examination 07/22/2011  . Colon cancer screening 07/22/2011  . History of anemia 05/06/2011  . Other  asplenic status 04/01/2011  . Hypothyroid 02/24/2011  . Varicose veins 02/24/2011  . Elevated blood pressure 11/20/2010  . Obesity 11/20/2010  . DISORDERS OF PHOSPHORUS  METABOLISM 08/09/2009  . Other chronic sinusitis 07/03/2009  . THROAT PAIN, CHRONIC 07/03/2009  . GERD 12/20/2008  . Anxiety state, unspecified 08/22/2008  . MENOPAUSAL SYNDROME 07/13/2007  . HYPERCHOLESTEROLEMIA, PURE 07/08/2006  . Allergic rhinitis 07/08/2006  . OVERACTIVE BLADDER 07/08/2006  . 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 Depression 06/25/2006  . PERIPHERAL VASCULAR DISEASE 06/25/2006  . OSTEOARTHRITIS 06/25/2006  . LEG EDEMA 06/25/2006  . SKIN CANCER, HX OF 06/25/2006  . COLONIC POLYPS, HX OF 06/25/2006   Gorden Harms. Irmalee Riemenschneider, PT, DPT [phone]  Tessia Kassin 07/02/2015, 12:48 PM  [place] MAIN [facility] SERVICES [address] [place], [place], [address] Phone: [phone]   Fax:  [phone]  Name: Diamond Boyd MRN: [id number] Date of Birth: 12/05/1940

## 2015-07-06 ENCOUNTER — Ambulatory Visit (INDEPENDENT_AMBULATORY_CARE_PROVIDER_SITE_OTHER): Payer: Medicare Other | Admitting: Family Medicine

## 2015-07-06 ENCOUNTER — Encounter: Payer: Self-pay | Admitting: Family Medicine

## 2015-07-06 VITALS — BP 112/62 | HR 65 | Temp 98.7°F | Ht 65.0 in | Wt 243.8 lb

## 2015-07-06 DIAGNOSIS — J328 Other chronic sinusitis: Secondary | ICD-10-CM

## 2015-07-06 DIAGNOSIS — I2510 Atherosclerotic heart disease of native coronary artery without angina pectoris: Secondary | ICD-10-CM

## 2015-07-06 MED ORDER — DOXYCYCLINE HYCLATE 100 MG PO TABS: 100.0000 mg | ORAL_TABLET | Freq: Two times a day (BID) | ORAL | Status: DC

## 2015-07-06 MED ORDER — FEXOFENADINE HCL 180 MG PO TABS: 180.0000 mg | ORAL_TABLET | Freq: Every day | ORAL | Status: DC

## 2015-07-06 NOTE — Assessment & Plan Note (Signed)
Pt has another uri with focal L maxillary sinus pain  Enc her to talk to her ENT about this -esp with her immunocop. Status Refilled doxycycline  sympt care expl if this is viral -it will have to run its course Update if not starting to improve in a week or if worsening

## 2015-07-06 NOTE — Progress Notes (Signed)
Subjective:    Patient ID: Diamond Boyd, female    DOB: January 05, 1941, 75 y.o.   MRN: [id number]  HPI Here for uri symptoms   Cold symptoms started 2 d ago Coughing all day - dry so far  No fever  ST mild  Headache- generalized  Nasal d/c is green and some soreness in L side of face (always L is worse) Ears itch  No otc meds     Did get better with doxy for sinusitis a mo ago   Chemo upcoming may 31   Patient Active Problem List   Diagnosis Date Noted  . Generalized weakness 05/11/2015  . Benign essential HTN   . PAF (paroxysmal atrial fibrillation) ([place])   . Internal hemorrhoids 02/21/2015  . Rectal bleeding 02/06/2015  . 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 Thrombocytopenia ([place]) 01/29/2015  . AKI (acute kidney injury) ([place])   . Elevated troponin   . Atrial fibrillation with rapid ventricular response ([place]) 01/23/2015  . New onset a-fib ([place]) 01/23/2015  . Hypertension 01/23/2015  . CHF (congestive heart failure) ([place]) 01/21/2015  . Chronic systolic CHF (congestive heart failure), NYHA class 2 ([place]) 01/11/2015  . Fever, unspecified 01/10/2015  . Immunocompromised status associated with infection ([place]) 01/08/2015  . 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 Preventive measure 11/14/2014  . Parotid mass 11/14/2014  . Chronic lower back pain 09/28/2014  . Mediastinal lymphadenopathy 09/27/2014  . Neuropathy due to chemotherapeutic drug ([place]) 06/19/2014  . Peripheral neuropathy due to chemotherapy ([place]) 04/03/2014  . Trochanteric bursitis of right hip 04/03/2014  . Anemia in chronic illness 02/21/2014  . Elevated liver enzymes 02/21/2014  . Urinary tract infection 11/18/2013  . Candidal intertrigo 09/23/2013  . Atrophic vaginitis 09/23/2013  . Drug-induced neutropenia ([place]) 09/01/2013  . History of colon cancer 06/17/2013  .  History of hodgkin's lymphoma 05/20/2013  . Dysuria 05/20/2013  . Conjunctivitis, acute 04/27/2013  . Cold sore 04/27/2013  . Left temporal headache 03/11/2013  . Hyperglycemia 03/11/2013  . Acute maxillary sinusitis 02/09/2013  . Other malaise and fatigue 01/04/2013  . Herpes zoster 11/22/2012  . Vaginal pain 11/22/2012  . Cystocele 09/22/2012  . Encounter for Medicare annual wellness exam 09/17/2012  . Left knee pain 09/10/2012  . Thoracic back pain 06/01/2012  . Skin lesion of face 05/17/2012  . Other screening mammogram 07/22/2011  . Routine gynecological examination 07/22/2011  . Colon cancer screening 07/22/2011  . History of anemia 05/06/2011  . 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 Hypothyroid 02/24/2011  . Varicose veins 02/24/2011  . Elevated blood pressure 11/20/2010  . Obesity 11/20/2010  . 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 Other chronic sinusitis 07/03/2009  . THROAT PAIN, CHRONIC 07/03/2009  . GERD 12/20/2008  . Anxiety state, unspecified 08/22/2008  . MENOPAUSAL SYNDROME 07/13/2007  . HYPERCHOLESTEROLEMIA, PURE 07/08/2006  . Allergic rhinitis 07/08/2006  . OVERACTIVE BLADDER 07/08/2006  . 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 Depression 06/25/2006  . PERIPHERAL VASCULAR DISEASE 06/25/2006  . OSTEOARTHRITIS 06/25/2006  . LEG EDEMA 06/25/2006  . SKIN CANCER, HX OF 06/25/2006  . COLONIC POLYPS, HX OF 06/25/2006   Past Medical History  Diagnosis Date  . Colon polyps   . Depression   . Hypothyroidism   . Osteoarthritis     hands  . Peripheral vascular disease ([place])   . Degenerative disk disease  Thoracic spine  . 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 Cancer ([place])     skin cancer- basal cell on arm / colon 2011  . Lymphoma ([place])   . Colon cancer ( [place])     2011, s/p surgery, in remission  . Neuropathy due to chemotherapeutic drug ([place]) 06/19/2014  . hodgkins lymphoma   . Heart murmur   . GERD (gastroesophageal reflux disease)   .  Anemia     hx blood tx  . Pneumonia   . 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 Benign essential HTN   . Chronic systolic CHF (congestive heart failure), NYHA class 2 ([place]) 01/2015  . NICM (nonischemic cardiomyopathy) ([place]) 01/2015  . PAF (paroxysmal atrial fibrillation) ([place])   . Generalized weakness 05/11/2015   Past Surgical History  Procedure Laterality Date  . Cholecystectomy    . Splenectomy      lymphoma  . Breast biopsy  1996  . Plantar fascia release    . Ventral hernia repair  1998  . Tendon release      Right thumb   . Colectomy  8/11  . Vaginal hysterectomy    . Bone marrow biopsy  05/27/13  . Cataract extraction, bilateral  2016  . Port-a-cath insertion    . Colonoscopy w/ biopsies    . Dg biopsy lung    . Video bronchoscopy with endobronchial ultrasound N/A 10/17/2014    Procedure: VIDEO BRONCHOSCOPY WITH ENDOBRONCHIAL ULTRASOUND;  Surgeon: Javier Glazier, MD;  Location: [facility];  Service: Thoracic;  Laterality: N/A;  . Video bronchoscopy with endobronchial ultrasound N/A 11/01/2014    Procedure: VIDEO BRONCHOSCOPY WITH ENDOBRONCHIAL ULTRASOUND;  Surgeon: Grace Isaac, MD;  Location: [facility];  Service: Thoracic;  Laterality: N/A;  . Mediastinoscopy N/A 11/01/2014    Procedure: MEDIASTINOSCOPY;  Surgeon: Grace Isaac, MD;  Location: [facility];  Service: Thoracic;  Laterality: N/A;  . Lymph node biopsy N/A 11/01/2014    Procedure: MEDIASTINAL LYMPH NODE BIOPSY;  Surgeon: Grace Isaac, MD;  Location: [facility];  Service: Thoracic;  Laterality: N/A;  . Cardiac catheterization N/A 01/25/2015    Procedure: Left Heart Cath and Coronary Angiography;  Surgeon: Peter M Martinique, MD;  Location: [facility];  Service: Cardiovascular;  Laterality: N/A;   Social History  Substance Use Topics  . Smoking status: Never Smoker   . Smokeless tobacco: Never Used     Comment:  Second-hand exposure through father.  . Alcohol Use: No   Family History  Problem Relation Age of [place]  . Coronary artery disease Mother   . Diabetes Mother   . Diabetes Brother   . Fibromyalgia Daughter     chronic pain   . COPD Daughter   . Colon cancer Neg Hx   . Colon polyps Neg Hx   . Stomach cancer Neg Hx   . Rectal cancer Neg Hx   . Ulcerative colitis Neg Hx   . Crohn's disease Neg Hx   . Asthma Daughter   . Rheum arthritis Brother   . Clotting disorder Brother   . Alcohol abuse Sister    Allergies  Allergen Reactions  . Achromycin [Tetracycline Hcl] Other (See Comments)    Pt does not remember reaction  . Allopurinol Other (See Comments)    REACTION: Unsure of reaction happene years ago  . Astelin [Azelastine Hcl] Other (See Comments)  Reaction unknown  . Cephalexin Other (See Comments)    REACTION: unsure of reaction happened yrs ago.  . Codeine Other (See Comments)    REACTION: abd. pain  . Lisinopril Cough  . Meloxicam Other (See Comments)    REACTION: GI symptoms  . Minocycline Other (See Comments)    Abdominal pain  . Nabumetone Other (See Comments)    REACTION: reaction not known  . Nyquil [Pseudoeph-Doxylamine-Dm-Apap] Hives  . Penicillins Other (See Comments)    Reaction unknown Has patient had a PCN reaction causing immediate rash, facial/tongue/throat swelling, SOB or lightheadedness with hypotension: unsure reaction unknown Has patient had a PCN reaction causing severe rash involving mucus membranes or skin necrosis: unsure reaction unknown Has patient had a PCN reaction that required hospitalization no Has patient had a PCN reaction occurring within the last 10 years: no If all of the above answers are "NO", then may proceed with Cephalosporin use.  . Sulfa Antibiotics Other (See Comments)    Gi side eff   . Zolpidem Tartrate Other (See Comments)    REACTION: feels too drugged  . Buspar [Buspirone Hcl] Other (See Comments)    Dizziness, and  not as effective for anxiety  . Ciprofloxacin Rash   Current Outpatient Prescriptions on File Prior to Visit  Medication Sig Dispense Refill  . acetaminophen (TYLENOL) 650 MG CR tablet Take 650 mg by mouth every 8 (eight) hours as needed (For back pain.).    Marland Kitchen acyclovir (ZOVIRAX) 400 MG tablet Take 1 tablet (400 mg total) by mouth daily. 90 tablet 3  . aspirin 81 MG tablet Take 81 mg by mouth daily.    . Calcium Carbonate-Vitamin D (CALCIUM 600+D) 600-400 MG-UNIT per tablet Take 1 tablet by mouth every evening.     . celecoxib (CELEBREX) 200 MG capsule Take 1 capsule (200 mg total) by mouth daily as needed for moderate pain. 90 capsule 3  . Cholecalciferol (VITAMIN D-3) 1000 UNITS CAPS Take 1,000 Units by mouth daily.    Marland Kitchen docusate sodium (COLACE) 100 MG capsule Take 100 mg by mouth at bedtime.    Marland Kitchen estradiol (ESTRACE) 1 MG tablet Take 1 tablet (1 mg total) by mouth daily. 90 tablet 3  . fexofenadine (ALLEGRA) 180 MG tablet Take 180 mg by mouth daily. Reported on 04/23/2015    . FLUoxetine (PROZAC) 20 MG capsule Take 1 capsule (20 mg total) by mouth daily. 90 capsule 3  . fluticasone (FLONASE) 50 MCG/ACT nasal spray Place 2 sprays into both nostrils daily. 48 g 3  . furosemide (LASIX) 20 MG tablet Take 1 tablet (20 mg total) by mouth every other day. Take 1 tab by mouth daily for weight gain. 90 tablet 3  . gabapentin (NEURONTIN) 300 MG capsule Take 1 capsule (300 mg total) by mouth 2 (two) times daily. 180 capsule 3  . levothyroxine (SYNTHROID, LEVOTHROID) 112 MCG tablet Take 1 tablet (112 mcg total) by mouth daily before breakfast. 90 tablet 3  . lidocaine-prilocaine (EMLA) cream Apply 1 application topically as needed (For port-a-cath.). Reported on 02/22/2015    . losartan (COZAAR) 25 MG tablet Take 1 tablet (25 mg total) by mouth daily. 90 tablet 3  . metoprolol (LOPRESSOR) 50 MG tablet TAKE 1 TABLET (50 MG TOTAL) BY MOUTH 2 (TWO) TIMES DAILY. 180 tablet 3  . Omega-3 350 MG CAPS Take 1  capsule by mouth daily. Reported on 02/22/2015    . omeprazole (PRILOSEC) 40 MG capsule Take 1 capsule (40 mg total) by  mouth daily. 90 capsule 3  . ondansetron (ZOFRAN) 8 MG tablet Take 8 mg by mouth every 6 (six) hours as needed for nausea or vomiting. Reported on 03/05/2015    . polyethylene glycol (MIRALAX / GLYCOLAX) packet Take 17 g by mouth at bedtime.    . prochlorperazine (COMPAZINE) 10 MG tablet Take 10 mg by mouth every 6 (six) hours as needed for nausea or vomiting. Reported on 03/05/2015    . psyllium (METAMUCIL) 58.6 % packet Take 1-3 packets by mouth at bedtime.     . simvastatin (ZOCOR) 40 MG tablet Take 1 tablet (40 mg total) by mouth at bedtime. 90 tablet 0  . vitamin E (VITAMIN E) 400 UNIT capsule Take 400 Units by mouth daily. Reported on 03/05/2015     No current facility-administered medications on file prior to visit.    Review of Systems  Constitutional: Positive for appetite change. Negative for fever and fatigue.  HENT: Positive for congestion, ear pain, postnasal drip, rhinorrhea, sinus pressure and sore throat. Negative for nosebleeds.   Eyes: Negative for pain, redness and itching.  Respiratory: Positive for cough. Negative for shortness of breath and wheezing.   Cardiovascular: Negative for chest pain.  Gastrointestinal: Negative for nausea, vomiting, abdominal pain and diarrhea.  Endocrine: Negative for polyuria.  Genitourinary: Negative for dysuria, urgency and frequency.  Musculoskeletal: Negative for myalgias and arthralgias.  Allergic/Immunologic: Negative for immunocompromised state.  Neurological: Positive for headaches. Negative for dizziness, tremors, syncope, weakness and numbness.  Hematological: Negative for adenopathy. Does not bruise/bleed easily.  Psychiatric/Behavioral: Negative for dysphoric mood. The patient is not nervous/anxious.        Objective:   Physical Exam  Constitutional: She appears well-developed and well-nourished. No distress.    HENT:  Head: Normocephalic and atraumatic.  Right Ear: External ear normal.  Left Ear: External ear normal.  Mouth/Throat: Oropharynx is clear and moist. No oropharyngeal exudate.  Nares are injected and congested  L sided maxillary  sinus tenderness  Post nasal drip  No facial swelling   Eyes: Conjunctivae and EOM are normal. Pupils are equal, round, and reactive to light. Right eye exhibits no discharge. Left eye exhibits no discharge.  Neck: Normal range of motion. Neck supple.  Cardiovascular: Normal rate and regular rhythm.   Pulmonary/Chest: Effort normal and breath sounds normal. No respiratory distress. She has no wheezes. She has no rales.  Lymphadenopathy:    She has no cervical adenopathy.  Neurological: She is alert. No cranial nerve deficit.  Skin: Skin is warm and dry. No rash noted.  Psychiatric: She has a normal mood and affect.          Assessment & Plan:   Problem List Items Addressed This Visit      Respiratory   Other chronic sinusitis - Primary    Pt has another uri with focal L maxillary sinus pain  Enc her to talk to her ENT about this -esp with her immunocop. Status Refilled doxycycline  sympt care expl if this is viral -it will have to run its course Update if not starting to improve in a week or if worsening        Relevant Medications   doxycycline (VIBRA-TABS) 100 MG tablet   fexofenadine (ALLEGRA) 180 MG tablet

## 2015-07-06 NOTE — Patient Instructions (Signed)
I think you have a cold but cannot rule out a recurrent sinus infection  Take the doxycycline as directed  Consider getting back to your ENT - because I do not know why your left maxillary sinus keeps acting up  Let your oncologist know you are sick - they may want to re schedule chemo if you are not feeling well Rest and drink fluids

## 2015-07-06 NOTE — Progress Notes (Signed)
Pre visit review using our clinic review tool, if applicable. No additional management support is needed unless otherwise documented below in the visit note. 

## 2015-07-10 ENCOUNTER — Other Ambulatory Visit: Payer: Self-pay | Admitting: Hematology and Oncology

## 2015-07-10 ENCOUNTER — Ambulatory Visit: Payer: Medicare Other

## 2015-07-10 ENCOUNTER — Encounter (HOSPITAL_COMMUNITY)
Admission: RE | Admit: 2015-07-10 | Discharge: 2015-07-10 | Disposition: A | Discharge status: Home / Self Care | Payer: Medicare Other | Source: Ambulatory Visit | Attending: Hematology and Oncology | Admitting: Hematology and Oncology

## 2015-07-10 DIAGNOSIS — C8228 Follicular lymphoma grade III, unspecified, lymph nodes of multiple sites: Secondary | ICD-10-CM | POA: Diagnosis not present

## 2015-07-10 DIAGNOSIS — C8220 Follicular lymphoma grade III, unspecified, unspecified site: Secondary | ICD-10-CM | POA: Diagnosis not present

## 2015-07-10 LAB — GLUCOSE, CAPILLARY: GLUCOSE-CAPILLARY: 96 mg/dL (ref 65–99)

## 2015-07-10 MED ORDER — FLUDEOXYGLUCOSE F - 18 (FDG) INJECTION
12.1200 | Freq: Once | INTRAVENOUS | Status: AC | PRN
  Administered 2015-07-10: 12.12 via INTRAVENOUS

## 2015-07-11 ENCOUNTER — Other Ambulatory Visit (HOSPITAL_BASED_OUTPATIENT_CLINIC_OR_DEPARTMENT_OTHER): Payer: Medicare Other

## 2015-07-11 ENCOUNTER — Ambulatory Visit (HOSPITAL_BASED_OUTPATIENT_CLINIC_OR_DEPARTMENT_OTHER): Payer: Medicare Other

## 2015-07-11 ENCOUNTER — Ambulatory Visit (HOSPITAL_BASED_OUTPATIENT_CLINIC_OR_DEPARTMENT_OTHER): Payer: Medicare Other | Admitting: Hematology and Oncology

## 2015-07-11 ENCOUNTER — Ambulatory Visit: Payer: Medicare Other

## 2015-07-11 ENCOUNTER — Telehealth: Payer: Self-pay | Admitting: Hematology and Oncology

## 2015-07-11 ENCOUNTER — Other Ambulatory Visit: Payer: Self-pay

## 2015-07-11 ENCOUNTER — Encounter: Payer: Self-pay | Admitting: Hematology and Oncology

## 2015-07-11 VITALS — BP 139/78 | HR 55 | Temp 97.1°F | Resp 18

## 2015-07-11 VITALS — BP 129/55 | HR 65 | Temp 98.2°F | Resp 18 | Ht 65.0 in | Wt 243.8 lb

## 2015-07-11 DIAGNOSIS — Z5112 Encounter for antineoplastic immunotherapy: Secondary | ICD-10-CM | POA: Diagnosis present

## 2015-07-11 DIAGNOSIS — J3089 Other allergic rhinitis: Secondary | ICD-10-CM

## 2015-07-11 DIAGNOSIS — C8228 Follicular lymphoma grade III, unspecified, lymph nodes of multiple sites: Secondary | ICD-10-CM

## 2015-07-11 DIAGNOSIS — Z95828 Presence of other vascular implants and grafts: Secondary | ICD-10-CM

## 2015-07-11 DIAGNOSIS — Z8571 Personal history of Hodgkin lymphoma: Secondary | ICD-10-CM

## 2015-07-11 DIAGNOSIS — I5022 Chronic systolic (congestive) heart failure: Secondary | ICD-10-CM

## 2015-07-11 DIAGNOSIS — Z85038 Personal history of other malignant neoplasm of large intestine: Secondary | ICD-10-CM | POA: Diagnosis not present

## 2015-07-11 DIAGNOSIS — I480 Paroxysmal atrial fibrillation: Secondary | ICD-10-CM

## 2015-07-11 LAB — CBC WITH DIFFERENTIAL/PLATELET
BASO%: 1 % (ref 0.0–2.0)
BASOS ABS: 0 10*3/uL (ref 0.0–0.1)
EOS ABS: 0.3 10*3/uL (ref 0.0–0.5)
EOS%: 6 % (ref 0.0–7.0)
HCT: 38 % (ref 34.8–46.6)
HGB: 12.6 g/dL (ref 11.6–15.9)
LYMPH%: 28.6 % (ref 14.0–49.7)
MCH: 30.9 pg (ref 25.1–34.0)
MCHC: 33.1 g/dL (ref 31.5–36.0)
MCV: 93.3 fL (ref 79.5–101.0)
MONO#: 0.9 10*3/uL (ref 0.1–0.9)
MONO%: 18.2 % — ABNORMAL HIGH (ref 0.0–14.0)
NEUT%: 46.2 % (ref 38.4–76.8)
NEUTROS ABS: 2.2 10*3/uL (ref 1.5–6.5)
PLATELETS: 210 10*3/uL (ref 145–400)
RBC: 4.08 10*6/uL (ref 3.70–5.45)
RDW: 14.6 % — ABNORMAL HIGH (ref 11.2–14.5)
WBC: 4.8 10*3/uL (ref 3.9–10.3)
lymph#: 1.4 10*3/uL (ref 0.9–3.3)

## 2015-07-11 LAB — COMPREHENSIVE METABOLIC PANEL
ALT: 18 U/L (ref 0–55)
ANION GAP: 10 meq/L (ref 3–11)
AST: 20 U/L (ref 5–34)
Albumin: 3.7 g/dL (ref 3.5–5.0)
Alkaline Phosphatase: 131 U/L (ref 40–150)
BILIRUBIN TOTAL: 0.49 mg/dL (ref 0.20–1.20)
BUN: 14.2 mg/dL (ref 7.0–26.0)
CO2: 25 meq/L (ref 22–29)
Calcium: 9.1 mg/dL (ref 8.4–10.4)
Chloride: 106 mEq/L (ref 98–109)
Creatinine: 1 mg/dL (ref 0.6–1.1)
EGFR: 55 mL/min/{1.73_m2} — AB (ref >90)
Glucose: 144 mg/dl — ABNORMAL HIGH (ref 70–140)
Potassium: 3.4 mEq/L — ABNORMAL LOW (ref 3.5–5.1)
Sodium: 141 mEq/L (ref 136–145)
TOTAL PROTEIN: 6.2 g/dL — AB (ref 6.4–8.3)

## 2015-07-11 MED ORDER — SODIUM CHLORIDE 0.9 % IJ SOLN
10.0000 mL | INTRAMUSCULAR | Status: DC | PRN
  Administered 2015-07-11: 10 mL via INTRAVENOUS
  Filled 2015-07-11: qty 10

## 2015-07-11 MED ORDER — ACETAMINOPHEN 325 MG PO TABS
650.0000 mg | ORAL_TABLET | Freq: Once | ORAL | Status: AC
  Administered 2015-07-11: 650 mg via ORAL

## 2015-07-11 MED ORDER — DIPHENHYDRAMINE HCL 25 MG PO CAPS
ORAL_CAPSULE | ORAL | Status: AC
  Filled 2015-07-11: qty 2

## 2015-07-11 MED ORDER — SODIUM CHLORIDE 0.9 % IV SOLN
Freq: Once | INTRAVENOUS | Status: AC
  Administered 2015-07-11: 11:00:00 via INTRAVENOUS

## 2015-07-11 MED ORDER — HEPARIN SOD (PORK) LOCK FLUSH 100 UNIT/ML IV SOLN
500.0000 [IU] | Freq: Once | INTRAVENOUS | Status: AC | PRN
  Administered 2015-07-11: 500 [IU]
  Filled 2015-07-11: qty 5

## 2015-07-11 MED ORDER — SODIUM CHLORIDE 0.9 % IV SOLN
375.0000 mg/m2 | Freq: Once | INTRAVENOUS | Status: AC
  Administered 2015-07-11: 800 mg via INTRAVENOUS
  Filled 2015-07-11: qty 20

## 2015-07-11 MED ORDER — DIPHENHYDRAMINE HCL 25 MG PO CAPS
50.0000 mg | ORAL_CAPSULE | Freq: Once | ORAL | Status: AC
  Administered 2015-07-11: 50 mg via ORAL

## 2015-07-11 MED ORDER — SODIUM CHLORIDE 0.9 % IJ SOLN
10.0000 mL | INTRAMUSCULAR | Status: DC | PRN
  Administered 2015-07-11: 10 mL
  Filled 2015-07-11: qty 10

## 2015-07-11 MED ORDER — ACETAMINOPHEN 325 MG PO TABS
ORAL_TABLET | ORAL | Status: AC
  Filled 2015-07-11: qty 2

## 2015-07-11 NOTE — Telephone Encounter (Signed)
Gave pt apt & avs °

## 2015-07-11 NOTE — Patient Instructions (Signed)

## 2015-07-11 NOTE — Progress Notes (Signed)
[facility] OFFICE PROGRESS NOTE  Patient Care Team: Abner Greenspan, MD as PCP - General Mosetta Anis, MD (Allergy) Fanny Skates, MD as Consulting Physician (General Surgery) Grace Isaac, MD as Consulting Physician (Cardiothoracic Surgery) Troy Sine, MD as Consulting Physician (Cardiology) Lonn Georgia, PA-C as Physician Assistant (Cardiology) [name], MD as Consulting Physician (Hematology and Oncology) Estill Cotta, MD as Consulting Physician (Ophthalmology)  SUMMARY OF ONCOLOGIC HISTORY: Oncology History   History of colon cancer   Staging form: Colon and Rectum, AJCC 7th Edition     Clinical: Stage IIA (T3, N0, M0) - Signed by [name], MD on 02/21/2014 History of hodgkin's lymphoma   Staging form: Lymphoid Neoplasms, AJCC 6th Edition     Clinical: Stage IV - Signed by [name], MD on 02/21/2014       History of hodgkin's lymphoma   05/20/2013 Initial Diagnosis Hodgkin lymphoma   06/27/2014 Imaging Interval increase in metabolic activity of several small lymph nodes is concerning for lymphoma recurrence. Lymph nodes include a small right level 2 lymph node, right hilar lymph node, and right paratracheal lymph node   07/07/2014 Pathology Results  limited tissue from ultrasound-guided biopsy was nondiagnostic   07/07/2014 Procedure  ultrasound-guided biopsy was nondiagnostic   09/27/2014 Imaging Repeat PET CT scan show diffuse disease concern for relapse.   10/17/2014 Pathology Results Accession: [id number] Biopsy was nondiagnostic   10/17/2014 Procedure She underwent bronchoscopy & EBUS & biopsy of  LN at Level 10L   11/01/2014 Surgery She underwent cronchoscopy with endobronchial ultrasound, mediastinoscopy and biopsy    11/01/2014 Pathology Results Accession: [id number] biopsy showed no evidence of lymphoma, only granulomas.   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 ([place])   12/13/2014 Imaging PET scan showed diffuse disease   12/28/2014 - 12/30/2014 Hospital Admission She was admitted to the hospital for cycle one of R-ICE   01/18/2015 - 01/20/2015 Chemotherapy She was admitted to the hospital for cycle 2 of R-ICE   01/20/2015 - 01/22/2015 Hospital Admission She was then admitted to [facility] with respiratory failure/fluid overload and was noted to have mild cardiomyopathy   01/23/2015 - 01/27/2015 Hospital Admission she was admitted to the hospital with new onset atrial fibrillation with rapid ventricular response and was found to have evidence of myocardial ischemia. She was discharged on medical management and oral anticoagulation therapy   01/25/2015 Procedure Left heart cath showed non-obstructive lesions: Prox RCA lesion, 40% stenosed.   Prox LAD to Mid LAD lesion, 30% stenosed. Ost LM lesion, 25% stenosed   01/30/2015 Imaging PET Ct scan showed near complete resolution of all disease   07/10/2015 PET scan PET showed Improved metabolic activity in the bony lesions with mild persistent hilar LN with increased SUV uptake    INTERVAL HISTORY: Please see below for problem oriented charting. She returns today for further follow-up related to her history of lymphoma and to review PET CT scan. She complained of recent recurrence of allergies and nasal drainage. Denies fever  or chills. No new lymphadenopathy. No recent as observation of congestive heart failure, chest pain or shortness of breath. No new leg edema.  REVIEW  OF SYSTEMS:   Constitutional: Denies fevers, chills or abnormal weight loss Eyes: Denies blurriness of vision Ears, nose, mouth, throat, and face: Denies mucositis or sore throat Respiratory: Denies cough, dyspnea or wheezes Cardiovascular: Denies palpitation, chest discomfort or lower extremity swelling Gastrointestinal:  Denies nausea, heartburn or change in bowel habits Skin: Denies abnormal skin rashes Lymphatics: Denies new lymphadenopathy or easy bruising Neurological:Denies numbness, tingling or new weaknesses Behavioral/Psych: Mood is stable, no new changes  All other systems were reviewed with the patient and are negative.  I have reviewed the past medical history, past surgical history, social history and family history with the patient and they are unchanged from previous note.  ALLERGIES:  is allergic to achromycin; allopurinol; astelin; cephalexin; codeine; lisinopril; meloxicam; minocycline; nabumetone; nyquil; penicillins; sulfa antibiotics; zolpidem tartrate; buspar; and ciprofloxacin.  MEDICATIONS:  Current Outpatient Prescriptions  Medication Sig Dispense Refill  . acetaminophen (TYLENOL) 650 MG CR tablet Take 650 mg by mouth every 8 (eight) hours as needed (For back pain.).    Marland Kitchen acyclovir (ZOVIRAX) 400 MG tablet Take 1 tablet (400 mg total) by mouth daily. 90 tablet 3  . aspirin 81 MG tablet Take 81 mg by mouth daily.    . Calcium Carbonate-Vitamin D (CALCIUM 600+D) 600-400 MG-UNIT per tablet Take 1 tablet by mouth every evening.     . celecoxib (CELEBREX) 200 MG capsule Take 1 capsule (200 mg total) by mouth daily as needed for moderate pain. 90 capsule 3  . Cholecalciferol (VITAMIN D-3) 1000 UNITS CAPS Take 1,000 Units by mouth daily.    Marland Kitchen docusate sodium (COLACE) 100 MG capsule Take 100 mg by mouth at bedtime.    Marland Kitchen doxycycline (VIBRA-TABS) 100 MG tablet Take 1 tablet (100 mg total) by mouth 2 (two) times daily. Take with non dairy food 20 tablet 0  . estradiol  (ESTRACE) 1 MG tablet Take 1 tablet (1 mg total) by mouth daily. 90 tablet 3  . fexofenadine (ALLEGRA) 180 MG tablet Take 1 tablet (180 mg total) by mouth daily. Reported on 04/23/2015 90 tablet 3  . FLUoxetine (PROZAC) 20 MG capsule Take 1 capsule (20 mg total) by mouth daily. 90 capsule 3  . fluticasone (FLONASE) 50 MCG/ACT nasal spray Place 2 sprays into both nostrils daily. 48 g 3  . furosemide (LASIX) 20 MG tablet Take 1 tablet (20 mg total) by mouth every other day. Take 1 tab by mouth daily for weight gain. 90 tablet 3  . gabapentin (NEURONTIN) 300 MG capsule Take 1 capsule (300 mg total) by mouth 2 (two) times daily. 180 capsule 3  . levothyroxine (SYNTHROID, LEVOTHROID) 112 MCG tablet Take 1 tablet (112 mcg total) by mouth daily before breakfast. 90 tablet 3  . lidocaine-prilocaine (EMLA) cream Apply 1 application topically as needed (For port-a-cath.). Reported on 02/22/2015    . losartan (COZAAR) 25 MG tablet Take 1 tablet (25 mg total) by mouth daily. 90 tablet 3  . metoprolol (LOPRESSOR) 50 MG tablet TAKE 1 TABLET (50 MG TOTAL) BY MOUTH 2 (TWO) TIMES DAILY. 180 tablet 3  . Omega-3 350 MG CAPS Take 1 capsule by mouth daily. Reported on 02/22/2015    . omeprazole (PRILOSEC) 40 MG capsule Take 1 capsule (40 mg total) by mouth daily. 90 capsule 3  . ondansetron (ZOFRAN) 8 MG tablet Take 8 mg by mouth every  6 (six) hours as needed for nausea or vomiting. Reported on 03/05/2015    . polyethylene glycol (MIRALAX / GLYCOLAX) packet Take 17 g by mouth at bedtime.    . prochlorperazine (COMPAZINE) 10 MG tablet Take 10 mg by mouth every 6 (six) hours as needed for nausea or vomiting. Reported on 03/05/2015    . psyllium (METAMUCIL) 58.6 % packet Take 1-3 packets by mouth at bedtime.     . simvastatin (ZOCOR) 40 MG tablet Take 1 tablet (40 mg total) by mouth at bedtime. 90 tablet 0  . vitamin E (VITAMIN E) 400 UNIT capsule Take 400 Units by mouth daily. Reported on 03/05/2015     No current  facility-administered medications for this visit.   Facility-Administered Medications Ordered in Other Visits  Medication Dose Route Frequency Provider Last Rate Last Dose  . acetaminophen (TYLENOL) tablet 650 mg  650 mg Oral Once [name], MD      . diphenhydrAMINE (BENADRYL) capsule 50 mg  50 mg Oral Once [name], MD      . heparin lock flush 100 unit/mL  500 Units Intracatheter Once PRN [name], MD      . riTUXimab (RITUXAN) 800 mg in sodium chloride 0.9 % 170 mL chemo infusion  375 mg/m2 (Treatment Plan Actual) Intravenous Once [name], MD      . sodium chloride 0.9 % injection 10 mL  10 mL Intracatheter PRN [name], MD        PHYSICAL EXAMINATION: ECOG PERFORMANCE STATUS: 1 - Symptomatic but completely ambulatory  Filed Vitals:   07/11/15 1011  BP: 129/55  Pulse: 65  Temp: 98.2 F (36.8 C)  Resp: 18   Filed Weights   07/11/15 1011  Weight: 243 lb 12.8 oz (110.587 kg)    GENERAL:alert, no distress and comfortable. She is obese SKIN: skin color, texture, turgor are normal, no rashes or significant lesions EYES: normal, Conjunctiva are pink and non-injected, sclera clear OROPHARYNX:no exudate, no erythema and lips, buccal mucosa, and tongue normal  NECK: supple, thyroid normal size, non-tender, without nodularity LYMPH:  no palpable lymphadenopathy in the cervical, axillary or inguinal LUNGS: clear to auscultation and percussion with normal breathing effort HEART: regular rate & rhythm and no murmurs and no lower extremity edema ABDOMEN:abdomen soft, non-tender and normal bowel sounds Musculoskeletal:no cyanosis of digits and no clubbing  NEURO: alert & oriented x 3 with fluent speech, no focal motor/sensory deficits  LABORATORY DATA:  I have reviewed the data as listed    Component Value Date/Time   NA 141 07/11/2015 0944   NA 140 04/09/2015 1300   K 3.4* 07/11/2015 0944   K 4.2 04/09/2015 1300   CL 102 04/09/2015 1300   CL 105 07/14/2012 0939   CO2  25 07/11/2015 0944   CO2 33* 04/09/2015 1300   GLUCOSE 144* 07/11/2015 0944   GLUCOSE 125* 04/09/2015 1300   GLUCOSE 103* 07/14/2012 0939   BUN 14.2 07/11/2015 0944   BUN 13 04/09/2015 1300   CREATININE 1.0 07/11/2015 0944   CREATININE 0.95* 04/09/2015 1300   CREATININE 1.01* 01/27/2015 0625   CALCIUM 9.1 07/11/2015 0944   CALCIUM 9.3 04/09/2015 1300   CALCIUM 9.3 08/09/2009 0000   PROT 6.2* 07/11/2015 0944   PROT 5.3* 01/24/2015 0855   ALBUMIN 3.7 07/11/2015 0944   ALBUMIN 3.1* 01/24/2015 0855   AST 20 07/11/2015 0944   AST 32 01/24/2015 0855   ALT 18 07/11/2015 0944   ALT 38 01/24/2015 0855  ALKPHOS 131 07/11/2015 0944   ALKPHOS 160* 01/24/2015 0855   BILITOT 0.49 07/11/2015 0944   BILITOT 1.0 01/24/2015 0855   GFRNONAA 53* 01/27/2015 0625   GFRAA >60 01/27/2015 0625    No results found for: SPEP, UPEP  Lab Results  Component Value Date   WBC 4.8 07/11/2015   NEUTROABS 2.2 07/11/2015   HGB 12.6 07/11/2015   HCT 38.0 07/11/2015   MCV 93.3 07/11/2015   PLT 210 07/11/2015      Chemistry      Component Value Date/Time   NA 141 07/11/2015 0944   NA 140 04/09/2015 1300   K 3.4* 07/11/2015 0944   K 4.2 04/09/2015 1300   CL 102 04/09/2015 1300   CL 105 07/14/2012 0939   CO2 25 07/11/2015 0944   CO2 33* 04/09/2015 1300   BUN 14.2 07/11/2015 0944   BUN 13 04/09/2015 1300   CREATININE 1.0 07/11/2015 0944   CREATININE 0.95* 04/09/2015 1300   CREATININE 1.01* 01/27/2015 0625      Component Value Date/Time   CALCIUM 9.1 07/11/2015 0944   CALCIUM 9.3 04/09/2015 1300   CALCIUM 9.3 08/09/2009 0000   ALKPHOS 131 07/11/2015 0944   ALKPHOS 160* 01/24/2015 0855   AST 20 07/11/2015 0944   AST 32 01/24/2015 0855   ALT 18 07/11/2015 0944   ALT 38 01/24/2015 0855   BILITOT 0.49 07/11/2015 0944   BILITOT 1.0 01/24/2015 0855       RADIOGRAPHIC STUDIES: I have personally reviewed the radiological images as listed and agreed with the findings in the report. Nm Pet  Image Restag (ps) Skull Base To Thigh  07/10/2015  CLINICAL DATA:  Subsequent treatment strategy for follicular lymphoma grade 3. EXAM: NUCLEAR MEDICINE PET SKULL BASE TO THIGH TECHNIQUE: 12.1 mCi F-18 FDG was injected intravenously. Full-ring PET imaging was performed from the skull base to thigh after the radiotracer. CT data was obtained and used for attenuation correction and anatomic localization. FASTING BLOOD GLUCOSE:  Value: 96 mg/dl COMPARISON:  01/30/2015 FINDINGS: NECK Hyperintense thyroid gland, left lobe maximum standard uptake value 32.8, previously 30.3. The right lobe was also hyperintense. The previous hypermetabolic parotid activity is no longer notable. CHEST Low grade left hilar metabolic activity maximum standard uptake value 5.5, previously 3.8. The faint activity along what appears to be atelectatic lung adjacent to the right side of the thoracic spine in the right lower lobe, maximum SUV 4.1. Mild cardiomegaly. Coronary, aortic arch, and branch vessel atherosclerotic vascular disease. ABDOMEN/PELVIS Splenectomy. Anal activity is likely physiologic, maximum SUV in this vicinity 9.8, previously 6.9. Lax anterior abdominal wall postoperative findings in the rectosigmoid junction. SKELETON Previous diffuse skeletal hypermetabolic activity was striking. Currently this has improved although there is multifocal abnormal skeletal activity still present. For example, an index right sternal lesion has a maximum standard uptake value of 8.1, previous activity in this vicinity was 13.8. A left proximal femoral focus of hypermetabolic activity has a maximum standard uptake value 9.1 (formerly 17.6). Multiple other focal bony lesions are present but the diffuse bony activity seen previously is no longer present. These skeletal lesions are not readily apparent on the CT scan data. Comparing back to 12/13/2014, a left posterior ischial lesion with previous metabolic activity at 38.1 currently has a maximum  SUV at 4.5. IMPRESSION: 1. Improved metabolic activity in the bony lesions, compared to 12/13/14. The diffuse hypermetabolic activity from 82/99/3716 was likely due to marrow stimulation. There continue to be multiple focal hypermetabolic lesions within the  skeleton. 2. Resolution of abnormal activity in certain areas such as the parotid glands. 3. Persistent hypermetabolic thyroid activity, diffusely, probably from thyroiditis. 4. The left hilar region has a maximum SUV of 5.5, previously 3.8. Background mediastinal activity of the blood pool has a maximum SUV of about 4.9; hands this left hilar activity is only very slightly above the background mediastinal activity. Electronically Signed   By: Van Clines M.D.   On: 07/10/2015 11:46     ASSESSMENT & PLAN:  History of hodgkin's lymphoma Recent PET CT scan from 07/10/2015 show significant improvement compared to prior PET scan. There were nonspecific activity in her bone and the left hilar of indeterminate etiology is. She is not symptomatic. I recommend close observation with repeat imaging study in 6 months, due around November 2017. 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 I discussed with her the role of maintenance rituximab Studies have shown maintenance treatment with rituximab for 2 years improve progression free survival Clinically, she has no evidence of disease today. I will proceed with rituximab today.  History of colon cancer Clinically, she has no signs of recurrence. She had flexible sigmoidoscopy on 02/22/2015 which show some small hemorrhoids.  She is due for colonoscopy and she will schedule with her gastroenterologist.  PAF (paroxysmal atrial fibrillation) ([place]) She had recent atrial fibrillation. Today, she appears to be in normal sinus rhythm and appeared to be rate control. Continue medical management. She is currently on  aspirin.  Chronic systolic CHF (congestive heart failure), NYHA class 2 ([facility]) She has no evidence of fluid overload or recent exacerbation of congestive heart failure. She will continue close monitoring and follow-up with cardiologist    Allergic rhinitis She has recurrent allergic rhinitis and is currently taking medications for this. She is requesting possibility of resuming allergy "shots"  I see no contraindication for her to resume appropriate treatment to treat her chronic allergy problems.    No orders of the defined types were placed in this encounter.   All questions were answered. The patient knows to call the clinic with any problems, questions or concerns. No barriers to learning was detected. I spent 25 minutes counseling the patient face to face. The total time spent in the appointment was 30 minutes and more than 50% was on counseling and review of test results     [facility], [place], MD 07/11/2015 10:54 AM

## 2015-07-11 NOTE — Assessment & Plan Note (Addendum)
Clinically, she has no signs of recurrence. She had flexible sigmoidoscopy on 02/22/2015 which show some small hemorrhoids.  She is due for colonoscopy and she will schedule with her gastroenterologist.

## 2015-07-11 NOTE — Assessment & Plan Note (Signed)
She has no evidence of fluid overload or recent exacerbation of congestive heart failure. She will continue close monitoring and follow-up with cardiologist

## 2015-07-11 NOTE — Assessment & Plan Note (Signed)
She had recent atrial fibrillation. Today, she appears to be in normal sinus rhythm and appeared to be rate control. Continue medical management. She is currently on aspirin.

## 2015-07-11 NOTE — Assessment & Plan Note (Signed)
Recent PET CT scan from 07/10/2015 show significant improvement compared to prior PET scan. There were nonspecific activity in her bone and the left hilar of indeterminate etiology is. She is not symptomatic. I recommend close observation with repeat imaging study in 6 months, due around November 2017. 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 I discussed with her the role of maintenance rituximab Studies have shown maintenance treatment with rituximab for 2 years improve progression free survival Clinically, she has no evidence of disease today. I will proceed with rituximab today.

## 2015-07-11 NOTE — Assessment & Plan Note (Signed)
She has recurrent allergic rhinitis and is currently taking medications for this. She is requesting possibility of resuming allergy "shots"  I see no contraindication for her to resume appropriate treatment to treat her chronic allergy problems.

## 2015-07-11 NOTE — Patient Instructions (Signed)
[facility] Discharge Instructions for Patients Receiving Chemotherapy  Today you received the following chemotherapy agents Rituxan To help prevent nausea and vomiting after your treatment, we encourage you to take your nausea medication as prescribed.  If you develop nausea and vomiting that is not controlled by your nausea medication, call the clinic.   BELOW ARE SYMPTOMS THAT SHOULD BE REPORTED IMMEDIATELY:  *FEVER GREATER THAN 100.5 F  *CHILLS WITH OR WITHOUT FEVER  NAUSEA AND VOMITING THAT IS NOT CONTROLLED WITH YOUR NAUSEA MEDICATION  *UNUSUAL SHORTNESS OF BREATH  *UNUSUAL BRUISING OR BLEEDING  TENDERNESS IN MOUTH AND THROAT WITH OR WITHOUT PRESENCE OF ULCERS  *URINARY PROBLEMS  *BOWEL PROBLEMS  UNUSUAL RASH Items with * indicate a potential emergency and should be followed up as soon as possible.  Feel free to call the clinic you have any questions or concerns. The clinic phone number is [phone].  Please show the CHEMO ALERT CARD at check-in to the Emergency Department and triage nurse.   

## 2015-07-12 ENCOUNTER — Ambulatory Visit: Payer: Medicare Other

## 2015-07-16 ENCOUNTER — Ambulatory Visit: Payer: Medicare Other

## 2015-07-17 ENCOUNTER — Encounter: Payer: Self-pay | Admitting: Family Medicine

## 2015-07-17 ENCOUNTER — Ambulatory Visit: Payer: Medicare Other

## 2015-07-17 ENCOUNTER — Ambulatory Visit (INDEPENDENT_AMBULATORY_CARE_PROVIDER_SITE_OTHER)
Admission: RE | Admit: 2015-07-17 | Discharge: 2015-07-17 | Disposition: A | Discharge status: Home / Self Care | Payer: Medicare Other | Source: Ambulatory Visit | Attending: Family Medicine | Admitting: Family Medicine

## 2015-07-17 ENCOUNTER — Ambulatory Visit (INDEPENDENT_AMBULATORY_CARE_PROVIDER_SITE_OTHER): Payer: Medicare Other | Admitting: Family Medicine

## 2015-07-17 VITALS — BP 122/58 | HR 65 | Temp 98.5°F | Wt 243.2 lb

## 2015-07-17 DIAGNOSIS — R059 Cough, unspecified: Secondary | ICD-10-CM

## 2015-07-17 DIAGNOSIS — R05 Cough: Secondary | ICD-10-CM | POA: Diagnosis not present

## 2015-07-17 DIAGNOSIS — I2510 Atherosclerotic heart disease of native coronary artery without angina pectoris: Secondary | ICD-10-CM

## 2015-07-17 LAB — CBC WITH DIFFERENTIAL/PLATELET
BASOS ABS: 0 10*3/uL (ref 0.0–0.1)
Basophils Relative: 0.6 % (ref 0.0–3.0)
EOS ABS: 0.2 10*3/uL (ref 0.0–0.7)
Eosinophils Relative: 3.2 % (ref 0.0–5.0)
HEMATOCRIT: 38.6 % (ref 36.0–46.0)
HEMOGLOBIN: 12.7 g/dL (ref 12.0–15.0)
LYMPHS PCT: 28.3 % (ref 12.0–46.0)
Lymphs Abs: 2.1 10*3/uL (ref 0.7–4.0)
MCHC: 33 g/dL (ref 30.0–36.0)
MCV: 92.9 fl (ref 78.0–100.0)
MONOS PCT: 21.3 % — AB (ref 3.0–12.0)
Monocytes Absolute: 1.6 10*3/uL — ABNORMAL HIGH (ref 0.1–1.0)
NEUTROS ABS: 3.5 10*3/uL (ref 1.4–7.7)
Neutrophils Relative %: 46.6 % (ref 43.0–77.0)
Platelets: 244 10*3/uL (ref 150.0–400.0)
RBC: 4.16 Mil/uL (ref 3.87–5.11)
RDW: 15.3 % (ref 11.5–15.5)
WBC: 7.6 10*3/uL (ref 4.0–10.5)

## 2015-07-17 NOTE — Patient Instructions (Signed)
Go to the lab on the way out.  We'll contact you with your lab and xray report. Continue the cough medicine for now and we'll be in touch.  Take care.  Glad to see you.

## 2015-07-17 NOTE — Progress Notes (Signed)
Pre visit review using our clinic review tool, if applicable. No additional management support is needed unless otherwise documented below in the visit note.  Sx started about 2 weeks ago.  Started with cough.  Then had sinus pressure.  Presumed sinus infection, done with doxycycline as of last night.  Still with cough.  Sinus pain is better but still fatigued and cough continues.   Some sputum still, yellowish, but less than prev.  Some wheeze occ noted. H/o PAF.  No fevers.  The cough medicine helps some.    On treatment for lymphoma every 60 days, rituxan.  Last done 07/11/15.   Meds, vitals, and allergies reviewed.   ROS: Per HPI unless specifically indicated in ROS section   GEN: nad, alert and oriented HEENT: mucous membranes moist, tm w/o erythema, nasal exam w/o erythema, clear discharge noted,  OP with cobblestoning, sinuses not ttp NECK: supple w/o LA CV: rrr.   PULM: ctab, no inc wob, cough noted but no focal dec in breath sounds and no wheeze.  EXT: 1+  Edema in compression stockings.

## 2015-07-18 DIAGNOSIS — R05 Cough: Secondary | ICD-10-CM | POA: Insufficient documentation

## 2015-07-18 DIAGNOSIS — R059 Cough, unspecified: Secondary | ICD-10-CM | POA: Insufficient documentation

## 2015-07-18 NOTE — Assessment & Plan Note (Signed)
Some better recently, not worsening but not back to baseline.  D/w pt.  If cxr neg, cbc unremarkable, and continues to improve then no need for more abx.  The issue of residual irritation vs active infection d/w pt.   Okay for outpatient f/u.  She agrees.  See notes on labs and xray.  Continue cough medicine.   She'll update [place] as needed.  Routed to pcp as FYI.

## 2015-07-19 ENCOUNTER — Ambulatory Visit: Payer: Medicare Other

## 2015-07-19 ENCOUNTER — Other Ambulatory Visit: Payer: Self-pay | Admitting: Physician Assistant

## 2015-07-19 NOTE — Telephone Encounter (Signed)
Rx(s) sent to pharmacy electronically.  

## 2015-07-23 ENCOUNTER — Ambulatory Visit: Payer: Medicare Other | Attending: Hematology and Oncology

## 2015-07-23 VITALS — HR 77

## 2015-07-23 DIAGNOSIS — M6281 Muscle weakness (generalized): Secondary | ICD-10-CM | POA: Diagnosis not present

## 2015-07-23 NOTE — Therapy (Signed)
[place] MAIN [facility] SERVICES [address] [place], [place], [address] Phone: [phone]   Fax:  [phone]  Physical Therapy Treatment/ progress note  Patient Details  Name: Diamond Boyd MRN: [id number] Date of Birth: 11-15-1940 Referring Provider: Alvy Bimler  Encounter Date: 07/23/2015      PT End of Session - 07/23/15 1233    Visit Number 3   Number of Visits 13   Date for PT Re-Evaluation 07/23/15   Authorization Type 3/10   PT Start Time 1115   PT Stop Time 1200   PT Time Calculation (min) 45 min   Equipment Utilized During Treatment Gait belt   Activity Tolerance Patient tolerated treatment well   Behavior During Therapy [facility] for tasks assessed/performed      Past Medical History  Diagnosis Date  . Colon polyps   . Depression   . Hypothyroidism   . Osteoarthritis     hands  . Peripheral vascular disease ([place])   . Degenerative disk disease     Thoracic spine  . 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 Cancer ([place])     skin cancer- basal cell on arm / colon 2011  . Lymphoma ([place])   . Colon cancer ([place])     2011, s/p surgery, in remission  . Neuropathy due to chemotherapeutic drug ([place]) 06/19/2014  . hodgkins lymphoma   . Heart murmur   . GERD (gastroesophageal reflux disease)   . Anemia     hx blood tx  . Pneumonia   . 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 Benign essential HTN   . Chronic systolic CHF (congestive heart failure), NYHA class 2 ([place]) 01/2015  . NICM (nonischemic cardiomyopathy) ([place]) 01/2015  . PAF (paroxysmal atrial fibrillation) ([place])   . Generalized weakness 05/11/2015    Past Surgical History  Procedure Laterality Date  . Cholecystectomy    . Splenectomy      lymphoma  . Breast biopsy  1996  . Plantar fascia release    . Ventral hernia repair  1998  . Tendon release      Right thumb   . Colectomy   8/11  . Vaginal hysterectomy    . Bone marrow biopsy  05/27/13  . Cataract extraction, bilateral  2016  . Port-a-cath insertion    . Colonoscopy w/ biopsies    . Dg biopsy lung    . Video bronchoscopy with endobronchial ultrasound N/A 10/17/2014    Procedure: VIDEO BRONCHOSCOPY WITH ENDOBRONCHIAL ULTRASOUND;  Surgeon: Javier Glazier, MD;  Location: [place];  Service: Thoracic;  Laterality: N/A;  . Video bronchoscopy with endobronchial ultrasound N/A 11/01/2014    Procedure: VIDEO BRONCHOSCOPY WITH ENDOBRONCHIAL ULTRASOUND;  Surgeon: Grace Isaac, MD;  Location: [place];  Service: Thoracic;  Laterality: N/A;  . Mediastinoscopy N/A 11/01/2014    Procedure: MEDIASTINOSCOPY;  Surgeon: Grace Isaac, MD;  Location: [place];  Service: Thoracic;  Laterality: N/A;  . Lymph node biopsy N/A 11/01/2014    Procedure: MEDIASTINAL LYMPH NODE BIOPSY;  Surgeon: Grace Isaac, MD;  Location: [place];  Service: Thoracic;  Laterality: N/A;  . Cardiac catheterization N/A 01/25/2015    Procedure: Left Heart Cath and Coronary Angiography;  Surgeon: Peter M Martinique, MD;  Location: [place] [facility];  Service: Cardiovascular;  Laterality: N/A;    Filed Vitals:   07/23/15 1150  Pulse: 77  SpO2: 94%        Subjective Assessment - 07/23/15 1150    Subjective pt reports she was ill the past 3 weeks. she had a respiratory infection. she is feeling about 50% better   Patient Stated Goals get stronger, walk farther   Currently in Pain? No/denies   Pain Score --      ThereXHulan Fess PT Assessment - 07/23/15 0001    Standardized Balance Assessment   Standardized Balance Assessment 10 meter walk test;Five Times Sit to Stand;Berg Balance Test   Five times sit to stand comments  25.8 sec   10 Meter Walk 0.95 m/s   Berg Balance Test   Sit to Stand Able to stand without using hands and stabilize independently   Standing Unsupported Able to stand safely 2 minutes   Sitting with Back Unsupported but Feet  Supported on Floor or Stool Able to sit safely and securely 2 minutes   Stand to Sit Sits safely with minimal use of hands   Transfers Able to transfer safely, minor use of hands   Standing Unsupported with Eyes Closed Able to stand 10 seconds with supervision   Standing Ubsupported with Feet Together Needs help to attain position but able to stand for 30 seconds with feet together   From Standing, Reach Forward with Outstretched Arm Can reach forward >12 cm safely (5")   From Standing Position, Pick up Object from Floor Able to pick up shoe, needs supervision   From Standing Position, Turn to Look Behind Over each Shoulder Looks behind from both sides and weight shifts well   Turn 360 Degrees Able to turn 360 degrees safely in 4 seconds or less   Standing Unsupported, Alternately Place Feet on Step/Stool Able to stand independently and complete 8 steps >20 seconds   Standing Unsupported, One Foot in Front Able to plae foot ahead of the other independently and hold 30 seconds   Standing on One Leg Tries to lift leg/unable to hold 3 seconds but remains standing independently   Total Score 45       Step ups with march of contralateral limb on 6" step, BUE support, 1 x10 BLE  NMR: bil stance on airex pad with EC, 10 x 10 sec, CGA throughout to maintain balance Forwards and bil side to side toe taps oin airex pad with no UE support with 6" step x 20; CGA- min A throughout to maintain balance bil trunk rotations in staggered stance with ball handoff (rear foot on airex, front foot on 6" step); CGA - min A throughout to maintain balance; more difficulty shifting weight to the R x 10 each side Bil standing on airex with ball toss 3x10, CGA - min A throughout to maintain balance; demonstrated most difficulty with weight shift to R Forwards/backwards stepping with weight shift over 1/2 foam roll 1 x 10 BLE; CGA - min A to maintain balance; more difficulty with RLE                          PT Education - 07/23/15 1233    Education provided Yes   Education Details resume HEP, walking program   Person(s) Educated Patient   Methods Explanation   Comprehension Verbalized understanding             PT Long Term Goals - 07/23/15 1235    PT LONG TERM GOAL #1   Title pt will perform 5x  sit to stand in <15s to improve LE strength    Time 4   Period Weeks   Status Partially Met   PT LONG TERM GOAL #2   Title pt will ambulate at 1.39ms to improve her community mobility    Time 4   Period Weeks   Status Partially Met   PT LONG TERM GOAL #3   Title pt will improve 647m walk by 15069femonstrating improved activity tolerance.    Baseline 740f2fth 1 seated rest break    Time 4   Period Weeks   Status On-going   PT LONG TERM GOAL #4   Title pt will increase berg balance score +6 pts in order to reduce fall risk    Time 4   Period Weeks   Status New               Plan - 06/12017/06/264    Clinical Impression Statement despite pts recent illness, she has made some progress regarding her gait speed and balance, however not significant. pt would benefit from continued skilled PT services now that she is over her illness to improve strength, mobility, balance and endurance as well as reduce fall risk.    Rehab Potential Good   Clinical Impairments Affecting Rehab Potential co-morbidities   PT Frequency 2x / week   PT Duration 4 weeks   PT Treatment/Interventions Neuromuscular re-education;Balance training;Therapeutic exercise;Therapeutic activities;Functional mobility training;Stair training;Gait training;Manual lymph drainage;Passive range of motion   PT Next Visit Plan HEP   Consulted and Agree with Plan of Care Patient      Patient will benefit from skilled therapeutic intervention in order to improve the following deficits and impairments:  Decreased strength, Difficulty walking, Impaired flexibility, Pain, Decreased  coordination, Decreased endurance, Decreased balance, Impaired sensation, Decreased activity tolerance  Visit Diagnosis: Muscle weakness (generalized)       G-Codes - 06/1June 26, 20176    Functional Assessment Tool Used 5 x sit to stand/BERG, 10mw75m   Functional Limitation Mobility: Walking and moving around   Mobility: Walking and Moving Around Current Status ([phone] 20 percent but less than 40 percent impaired, limited or restricted   Mobility: Walking and Moving Around Goal Status ([phone] 1 percent but less than 20 percent impaired, limited or restricted      Problem List Patient Active Problem List   Diagnosis Date Noted  . Cough 07/18/2015  . Port catheter in place 07/11/2015  . Generalized weakness 05/11/2015  . Benign essential HTN   . PAF (paroxysmal atrial fibrillation) (HCC) [place] Internal hemorrhoids 02/21/2015  . Rectal bleeding 02/06/2015  . 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 Thrombocytopenia (HCC) [place]19/2016  . AKI (acute kidney injury) (HCC) [place] Elevated troponin   . Atrial fibrillation with rapid ventricular response (HCC) [place]13/2016  . New onset a-fib (HCC) [place]13/2016  . Hypertension 01/23/2015  . CHF (congestive heart failure) (HCC) [place]11/2016  . Chronic systolic CHF (congestive heart failure), NYHA class 2 (HCC) [place]01/2016  . Fever, unspecified 01/10/2015  . Immunocompromised status associated with infection (HCC) [place]28/2016  . 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17/2016  . 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 Preventive measure 11/14/2014  . Parotid mass 11/14/2014  . Chronic lower back pain 09/28/2014  . Mediastinal lymphadenopathy 09/27/2014  . Neuropathy due to chemotherapeutic drug (HCC) [place]09/2016  . Peripheral neuropathy due to chemotherapy (HCC)[place]  04/03/2014  . Trochanteric bursitis of right hip 04/03/2014  . Anemia in chronic illness  02/21/2014  . Elevated liver enzymes 02/21/2014  . Urinary tract infection 11/18/2013  . Candidal intertrigo 09/23/2013  . Atrophic vaginitis 09/23/2013  . Drug-induced neutropenia ([place]) 09/01/2013  . History of colon cancer 06/17/2013  . History of hodgkin's lymphoma 05/20/2013  . Dysuria 05/20/2013  . Conjunctivitis, acute 04/27/2013  . Cold sore 04/27/2013  . Left temporal headache 03/11/2013  . Hyperglycemia 03/11/2013  . Acute maxillary sinusitis 02/09/2013  . Other malaise and fatigue 01/04/2013  . Herpes zoster 11/22/2012  . Vaginal pain 11/22/2012  . Cystocele 09/22/2012  . Encounter for Medicare annual wellness exam 09/17/2012  . Left knee pain 09/10/2012  . Thoracic back pain 06/01/2012  . Skin lesion of face 05/17/2012  . Other screening mammogram 07/22/2011  . Routine gynecological examination 07/22/2011  . Colon cancer screening 07/22/2011  . History of anemia 05/06/2011  . 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 Hypothyroid 02/24/2011  . Varicose veins 02/24/2011  . Elevated blood pressure 11/20/2010  . Obesity 11/20/2010  . 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 Other chronic sinusitis 07/03/2009  . THROAT PAIN, CHRONIC 07/03/2009  . GERD 12/20/2008  . Anxiety state, unspecified 08/22/2008  . MENOPAUSAL SYNDROME 07/13/2007  . HYPERCHOLESTEROLEMIA, PURE 07/08/2006  . Allergic rhinitis 07/08/2006  . OVERACTIVE BLADDER 07/08/2006  . 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 Depression 06/25/2006  . PERIPHERAL VASCULAR DISEASE 06/25/2006  . OSTEOARTHRITIS 06/25/2006  . LEG EDEMA 06/25/2006  . SKIN CANCER, HX OF 06/25/2006  . COLONIC POLYPS, HX OF 06/25/2006   Gorden Harms. Anea Fodera, PT, DPT [phone]   Gerlene Glassburn 07/23/2015, 12:38 PM  [place] MAIN [facility] SERVICES [address] [place], [place], [address] Phone: [phone]   Fax:  [phone]  Name: LINNETTE PANELLA MRN: [id number] Date of Birth:  [birth year]

## 2015-07-25 ENCOUNTER — Ambulatory Visit: Payer: Medicare Other

## 2015-07-25 DIAGNOSIS — M6281 Muscle weakness (generalized): Secondary | ICD-10-CM | POA: Diagnosis not present

## 2015-07-25 NOTE — Therapy (Signed)
[place] MAIN [facility] SERVICES [address] [place], [place], [address] Phone: [phone]   Fax:  [phone]  Physical Therapy Treatment  Patient Details  Name: Diamond Boyd MRN: [id number] Date of Birth: 07/19/1940 Referring Provider: Alvy Bimler  Encounter Date: 07/25/2015      PT End of Session - 07/25/15 1253    Visit Number 4   Number of Visits 13   Date for PT Re-Evaluation 08/13/15   Authorization Type 4/10   PT Start Time 1115   PT Stop Time 1200   PT Time Calculation (min) 45 min   Equipment Utilized During Treatment Gait belt   Activity Tolerance Patient tolerated treatment well   Behavior During Therapy [facility] for tasks assessed/performed      Past Medical History  Diagnosis Date  . Colon polyps   . Depression   . Hypothyroidism   . Osteoarthritis     hands  . Peripheral vascular disease ([place])   . Degenerative disk disease     Thoracic spine  . 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 Cancer ([place])     skin cancer- basal cell on arm / colon 2011  . Lymphoma ([place])   . Colon cancer ([place])     2011, s/p surgery, in remission  . Neuropathy due to chemotherapeutic drug ([place]) 06/19/2014  . hodgkins lymphoma   . Heart murmur   . GERD (gastroesophageal reflux disease)   . Anemia     hx blood tx  . Pneumonia   . 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 Benign essential HTN   . Chronic systolic CHF (congestive heart failure), NYHA class 2 ([place]) 01/2015  . NICM (nonischemic cardiomyopathy) ([place]) 01/2015  . PAF (paroxysmal atrial fibrillation) ([place])   . Generalized weakness 05/11/2015    Past Surgical History  Procedure Laterality Date  . Cholecystectomy    . Splenectomy      lymphoma  . Breast biopsy  1996  . Plantar fascia release    . Ventral hernia repair  1998  . Tendon release      Right thumb   . Colectomy  8/11  .  Vaginal hysterectomy    . Bone marrow biopsy  05/27/13  . Cataract extraction, bilateral  2016  . Port-a-cath insertion    . Colonoscopy w/ biopsies    . Dg biopsy lung    . Video bronchoscopy with endobronchial ultrasound N/A 10/17/2014    Procedure: VIDEO BRONCHOSCOPY WITH ENDOBRONCHIAL ULTRASOUND;  Surgeon: Javier Glazier, MD;  Location: [place];  Service: Thoracic;  Laterality: N/A;  . Video bronchoscopy with endobronchial ultrasound N/A 11/01/2014    Procedure: VIDEO BRONCHOSCOPY WITH ENDOBRONCHIAL ULTRASOUND;  Surgeon: Grace Isaac, MD;  Location: [place];  Service: Thoracic;  Laterality: N/A;  . Mediastinoscopy N/A 11/01/2014    Procedure: MEDIASTINOSCOPY;  Surgeon: Grace Isaac, MD;  Location: [place];  Service: Thoracic;  Laterality: N/A;  . Lymph node biopsy N/A 11/01/2014    Procedure: MEDIASTINAL LYMPH NODE BIOPSY;  Surgeon: Grace Isaac, MD;  Location: [place];  Service: Thoracic;  Laterality: N/A;  . Cardiac catheterization N/A 01/25/2015    Procedure: Left Heart Cath and Coronary Angiography;  Surgeon: Peter M Martinique, MD;  Location: [place] [facility];  Service: Cardiovascular;  Laterality: N/A;    There were no vitals filed for this visit.  Subjective Assessment - 07/25/15 1253    Subjective pt reports muscle soreness after last visit but has since resolved.    How long can you stand comfortably? 31mn   Patient Stated Goals get stronger, walk farther   Currently in Pain? No/denies       gait training: On TM 0.8-1.041m with cues to increase step length and heel strik 2 x 2 min Over ground gait training same cues as above x 20052f NMR: Bil staggered stance with front foot on dynadisc with perturbations in all directions x 6 min; min-mod A throughout for balance; pt frequently requiring use of UE to maintain balance.  Bil Fwd/retro step over 1/2 foam roll emphasizing heel strike to toe off, 1x10 each; CGA throughout for balance and min verbal cues for heel  strike      therex: Cable column walk outs 7.5lbs 2 x 84f45fde to side and x 5 laps 84ft61fro walking , pt requires CGA for safety on balance exercises  Pt needed brief rest breaks                      PT Education - 07/25/15 1253    Education provided Yes   Education Details gait training    Person(s) Educated Patient   Methods Explanation   Comprehension Verbalized understanding             PT Long Term Goals - 07/23/15 1235    PT LONG TERM GOAL #1   Title pt will perform 5x sit to stand in <15s to improve LE strength    Time 4   Period Weeks   Status Partially Met   PT LONG TERM GOAL #2   Title pt will ambulate at 1.64m/s 75mimprove her community mobility    Time 4   Period Weeks   Status Partially Met   PT LONG TERM GOAL #3   Title pt will improve 6min w564m by 150ft de8ftrating improved activity tolerance.    Baseline 740ft wit40fseated rest break    Time 4   Period Weeks   Status On-going   PT LONG TERM GOAL #4   Title pt will increase berg balance score +6 pts in order to reduce fall risk    Time 4   Period Weeks   Status New               Plan - 07/25/15 1254    Clinical Impression Statement pt did well with progression of strengthening and NMR, however she does report quite a bit of R LE fatigue. pt needs min-mod cues for proper exercise performance and CGA to min A for balance. pt did well with gait training,  but at times over exaggerated PT cues such as heel strike.    Rehab Potential Good   Clinical Impairments Affecting Rehab Potential co-morbidities   PT Frequency 2x / week   PT Duration 4 weeks   PT Treatment/Interventions Neuromuscular re-education;Balance training;Therapeutic exercise;Therapeutic activities;Functional mobility training;Stair training;Gait training;Manual lymph drainage;Passive range of motion   PT Next Visit Plan HEP   Consulted and Agree with Plan of Care Patient      Patient will benefit from  skilled therapeutic intervention in order to improve the following deficits and impairments:  Decreased strength, Difficulty walking, Impaired flexibility, Pain, Decreased coordination, Decreased endurance, Decreased balance, Impaired sensation, Decreased activity tolerance  Visit Diagnosis: Muscle weakness (generalized)     Problem List Patient Active Problem List  Diagnosis Date Noted  . Cough 07/18/2015  . Port catheter in place 07/11/2015  . Generalized weakness 05/11/2015  . Benign essential HTN   . PAF (paroxysmal atrial fibrillation) ([place])   . Internal hemorrhoids 02/21/2015  . Rectal bleeding 02/06/2015  . 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 Thrombocytopenia ([place]) 01/29/2015  . AKI (acute kidney injury) ([place])   . Elevated troponin   . Atrial fibrillation with rapid ventricular response ([place]) 01/23/2015  . New onset a-fib ([place]) 01/23/2015  . Hypertension 01/23/2015  . CHF (congestive heart failure) ([place]) 01/21/2015  . Chronic systolic CHF (congestive heart failure), NYHA class 2 ([place]) 01/11/2015  . Fever, unspecified 01/10/2015  . Immunocompromised status associated with infection ([place]) 01/08/2015  . 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 Preventive measure 11/14/2014  . Parotid mass 11/14/2014  . Chronic lower back pain 09/28/2014  . Mediastinal lymphadenopathy 09/27/2014  . Neuropathy due to chemotherapeutic drug ([place]) 06/19/2014  . Peripheral neuropathy due to chemotherapy ([place]) 04/03/2014  . Trochanteric bursitis of right hip 04/03/2014  . Anemia in chronic illness 02/21/2014  . Elevated liver enzymes 02/21/2014  . Urinary tract infection 11/18/2013  . Candidal intertrigo 09/23/2013  . Atrophic vaginitis 09/23/2013  . Drug-induced neutropenia ([place]) 09/01/2013  . History of colon cancer 06/17/2013  . History of hodgkin's  lymphoma 05/20/2013  . Dysuria 05/20/2013  . Conjunctivitis, acute 04/27/2013  . Cold sore 04/27/2013  . Left temporal headache 03/11/2013  . Hyperglycemia 03/11/2013  . Acute maxillary sinusitis 02/09/2013  . Other malaise and fatigue 01/04/2013  . Herpes zoster 11/22/2012  . Vaginal pain 11/22/2012  . Cystocele 09/22/2012  . Encounter for Medicare annual wellness exam 09/17/2012  . Left knee pain 09/10/2012  . Thoracic back pain 06/01/2012  . Skin lesion of face 05/17/2012  . Other screening mammogram 07/22/2011  . Routine gynecological examination 07/22/2011  . Colon cancer screening 07/22/2011  . History of anemia 05/06/2011  . 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 Hypothyroid 02/24/2011  . Varicose veins 02/24/2011  . Elevated blood pressure 11/20/2010  . Obesity 11/20/2010  . 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 Other chronic sinusitis 07/03/2009  . THROAT PAIN, CHRONIC 07/03/2009  . GERD 12/20/2008  . Anxiety state, unspecified 08/22/2008  . MENOPAUSAL SYNDROME 07/13/2007  . HYPERCHOLESTEROLEMIA, PURE 07/08/2006  . Allergic rhinitis 07/08/2006  . OVERACTIVE BLADDER 07/08/2006  . 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 Depression 06/25/2006  . PERIPHERAL VASCULAR DISEASE 06/25/2006  . OSTEOARTHRITIS 06/25/2006  . LEG EDEMA 06/25/2006  . SKIN CANCER, HX OF 06/25/2006  . COLONIC POLYPS, HX OF 06/25/2006   Geraldine Solar, SPT  [place]ley 07/25/2015, 1:00 PM  [place] MAIN [facility] SERVICES [address] [place], [place], [address] Phone: [phone]   Fax:  [phone]  Name: Diamond Boyd MRN: [id number] Date of Birth: November 06, 1940

## 2015-07-30 ENCOUNTER — Ambulatory Visit: Payer: Medicare Other

## 2015-07-30 DIAGNOSIS — M6281 Muscle weakness (generalized): Secondary | ICD-10-CM

## 2015-07-30 NOTE — Therapy (Signed)
[place] MAIN [facility] SERVICES [address] [place], [place], [address] Phone: [phone]   Fax:  [phone]  Physical Therapy Treatment  Patient Details  Name: Diamond Boyd MRN: [id number] Date of Birth: July 22, 1940 Referring Provider: Alvy Bimler  Encounter Date: 07/30/2015      PT End of Session - 07/30/15 1228    Visit Number 5   Number of Visits 13   Date for PT Re-Evaluation 08/13/15   Authorization Type 5/10   PT Start Time 1115   PT Stop Time 1156   PT Time Calculation (min) 41 min   Equipment Utilized During Treatment Gait belt   Activity Tolerance Patient tolerated treatment well   Behavior During Therapy [facility] for tasks assessed/performed      Past Medical History  Diagnosis Date  . Colon polyps   . Depression   . Hypothyroidism   . Osteoarthritis     hands  . Peripheral vascular disease ([place])   . Degenerative disk disease     Thoracic spine  . 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 Cancer ([place])     skin cancer- basal cell on arm / colon 2011  . Lymphoma ([place])   . Colon cancer ([place])     2011, s/p surgery, in remission  . Neuropathy due to chemotherapeutic drug ([place]) 06/19/2014  . hodgkins lymphoma   . Heart murmur   . GERD (gastroesophageal reflux disease)   . Anemia     hx blood tx  . Pneumonia   . 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 Benign essential HTN   . Chronic systolic CHF (congestive heart failure), NYHA class 2 ([place]) 01/2015  . NICM (nonischemic cardiomyopathy) ([place]) 01/2015  . PAF (paroxysmal atrial fibrillation) ([place])   . Generalized weakness 05/11/2015    Past Surgical History  Procedure Laterality Date  . Cholecystectomy    . Splenectomy      lymphoma  . Breast biopsy  1996  . Plantar fascia release    . Ventral hernia repair  1998  . Tendon release      Right thumb   . Colectomy  8/11  .  Vaginal hysterectomy    . Bone marrow biopsy  05/27/13  . Cataract extraction, bilateral  2016  . Port-a-cath insertion    . Colonoscopy w/ biopsies    . Dg biopsy lung    . Video bronchoscopy with endobronchial ultrasound N/A 10/17/2014    Procedure: VIDEO BRONCHOSCOPY WITH ENDOBRONCHIAL ULTRASOUND;  Surgeon: Javier Glazier, MD;  Location: [place];  Service: Thoracic;  Laterality: N/A;  . Video bronchoscopy with endobronchial ultrasound N/A 11/01/2014    Procedure: VIDEO BRONCHOSCOPY WITH ENDOBRONCHIAL ULTRASOUND;  Surgeon: Grace Isaac, MD;  Location: [place];  Service: Thoracic;  Laterality: N/A;  . Mediastinoscopy N/A 11/01/2014    Procedure: MEDIASTINOSCOPY;  Surgeon: Grace Isaac, MD;  Location: [place];  Service: Thoracic;  Laterality: N/A;  . Lymph node biopsy N/A 11/01/2014    Procedure: MEDIASTINAL LYMPH NODE BIOPSY;  Surgeon: Grace Isaac, MD;  Location: [place];  Service: Thoracic;  Laterality: N/A;  . Cardiac catheterization N/A 01/25/2015    Procedure: Left Heart Cath and Coronary Angiography;  Surgeon: Peter M Martinique, MD;  Location: [place] [facility];  Service: Cardiovascular;  Laterality: N/A;    There were no vitals filed for this visit.  Subjective Assessment - 07/30/15 1120    Subjective pt reports she feels like her legs are getting a little stronger. she also reports she doesnt feel like her neuropathy symptoms are as severe.    How long can you stand comfortably? 100mn   Patient Stated Goals get stronger, walk farther   Currently in Pain? Yes   Pain Score 3    Pain Location Foot   Pain Orientation Right   Pain Descriptors / Indicators Burning   Pain Type Neuropathic pain      therex  in fitness center: Leg press:  6 plates 3x10 Hamstring curls 4 plates 2x10 Knee extension 1 plates 2x10 Standing resisted hip flexion, abduction ,and extension with    yellow    Band x 10 each way  Monster walk fwd/ retro yellow x 5 laps Side stepping yellow band  in // bars x 3 laps  Pt requires min verbal and tactile cues for proper exercise performance      NMR 1 foot on dyna disc chopping yellow band x 10  1 foot on dyna disc [place] press yellow band 2 x 10  pt requires CGA for safety on balance exercises                            PT Education - 07/30/15 1227    Education provided Yes   Education Details HEP progression    Person(s) Educated Patient   Methods Explanation   Comprehension Verbalized understanding             PT Long Term Goals - 07/23/15 1235    PT LONG TERM GOAL #1   Title pt will perform 5x sit to stand in <15s to improve LE strength    Time 4   Period Weeks   Status Partially Met   PT LONG TERM GOAL #2   Title pt will ambulate at 1.085m to improve her community mobility    Time 4   Period Weeks   Status Partially Met   PT LONG TERM GOAL #3   Title pt will improve 13m513mwalk by [id number] improved activity tolerance.    Baseline [id number] 1 seated rest break    Time 4   Period Weeks   Status On-going   PT LONG TERM GOAL #4   Title pt will increase berg balance score +6 pts in order to reduce fall risk    Time 4   Period Weeks   Status New               Plan - 07/30/15 1228    Clinical Impression Statement pt did well with progerssion of strengthening and balance, however she was fatigued. she reported appropriate muscle fatigue. pt would benefit from continued skilled PT services to further address strength and balance to reduce fall risk and maximize functional mobility.    Rehab Potential Good   Clinical Impairments Affecting Rehab Potential co-morbidities   PT Frequency 2x / week   PT Duration 4 weeks   PT Treatment/Interventions Neuromuscular re-education;Balance training;Therapeutic exercise;Therapeutic activities;Functional mobility training;Stair training;Gait training;Manual lymph drainage;Passive range of motion   PT Next Visit Plan HEP   Consulted and  Agree with Plan of Care Patient      Patient will benefit from skilled therapeutic intervention in order to improve the following deficits and impairments:  Decreased strength, Difficulty walking, Impaired flexibility, Pain, Decreased coordination, Decreased endurance, Decreased balance, Impaired sensation, Decreased  activity tolerance  Visit Diagnosis: Muscle weakness (generalized)     Problem List Patient Active Problem List   Diagnosis Date Noted  . Cough 07/18/2015  . Port catheter in place 07/11/2015  . Generalized weakness 05/11/2015  . Benign essential HTN   . PAF (paroxysmal atrial fibrillation) ([place])   . Internal hemorrhoids 02/21/2015  . Rectal bleeding 02/06/2015  . 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 Thrombocytopenia ([place]) 01/29/2015  . AKI (acute kidney injury) ([place])   . Elevated troponin   . Atrial fibrillation with rapid ventricular response ([place]) 01/23/2015  . New onset a-fib ([place]) 01/23/2015  . Hypertension 01/23/2015  . CHF (congestive heart failure) ([place]) 01/21/2015  . Chronic systolic CHF (congestive heart failure), NYHA class 2 ([place]) 01/11/2015  . Fever, unspecified 01/10/2015  . Immunocompromised status associated with infection ([place]) 01/08/2015  . 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 Preventive measure 11/14/2014  . Parotid mass 11/14/2014  . Chronic lower back pain 09/28/2014  . Mediastinal lymphadenopathy 09/27/2014  . Neuropathy due to chemotherapeutic drug ([place]) 06/19/2014  . Peripheral neuropathy due to chemotherapy ([place]) 04/03/2014  . Trochanteric bursitis of right hip 04/03/2014  . Anemia in chronic illness 02/21/2014  . Elevated liver enzymes 02/21/2014  . Urinary tract infection 11/18/2013  . Candidal intertrigo 09/23/2013  . Atrophic vaginitis 09/23/2013  . Drug-induced neutropenia ([place]) 09/01/2013  .  History of colon cancer 06/17/2013  . History of hodgkin's lymphoma 05/20/2013  . Dysuria 05/20/2013  . Conjunctivitis, acute 04/27/2013  . Cold sore 04/27/2013  . Left temporal headache 03/11/2013  . Hyperglycemia 03/11/2013  . Acute maxillary sinusitis 02/09/2013  . Other malaise and fatigue 01/04/2013  . Herpes zoster 11/22/2012  . Vaginal pain 11/22/2012  . Cystocele 09/22/2012  . Encounter for Medicare annual wellness exam 09/17/2012  . Left knee pain 09/10/2012  . Thoracic back pain 06/01/2012  . Skin lesion of face 05/17/2012  . Other screening mammogram 07/22/2011  . Routine gynecological examination 07/22/2011  . Colon cancer screening 07/22/2011  . History of anemia 05/06/2011  . 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 Hypothyroid 02/24/2011  . Varicose veins 02/24/2011  . Elevated blood pressure 11/20/2010  . Obesity 11/20/2010  . 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 Other chronic sinusitis 07/03/2009  . THROAT PAIN, CHRONIC 07/03/2009  . GERD 12/20/2008  . Anxiety state, unspecified 08/22/2008  . MENOPAUSAL SYNDROME 07/13/2007  . HYPERCHOLESTEROLEMIA, PURE 07/08/2006  . Allergic rhinitis 07/08/2006  . OVERACTIVE BLADDER 07/08/2006  . 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 Depression 06/25/2006  . PERIPHERAL VASCULAR DISEASE 06/25/2006  . OSTEOARTHRITIS 06/25/2006  . LEG EDEMA 06/25/2006  . SKIN CANCER, HX OF 06/25/2006  . COLONIC POLYPS, HX OF 06/25/2006   Gorden Harms. , PT, DPT [phone]  , 07/30/2015, 12:33 PM  [place] MAIN [facility] SERVICES [address] [place], [place], [address] Phone: [phone]   Fax:  [phone]  Name: Diamond Boyd MRN: [id number] Date of Birth: 07/21/1940

## 2015-07-31 ENCOUNTER — Encounter: Payer: Self-pay | Admitting: Family Medicine

## 2015-07-31 ENCOUNTER — Telehealth: Payer: Self-pay | Admitting: Cardiovascular Disease

## 2015-07-31 ENCOUNTER — Ambulatory Visit (INDEPENDENT_AMBULATORY_CARE_PROVIDER_SITE_OTHER): Payer: Medicare Other | Admitting: Family Medicine

## 2015-07-31 VITALS — BP 114/70 | HR 66 | Temp 98.3°F | Ht 65.0 in | Wt 243.8 lb

## 2015-07-31 DIAGNOSIS — I2510 Atherosclerotic heart disease of native coronary artery without angina pectoris: Secondary | ICD-10-CM | POA: Diagnosis not present

## 2015-07-31 DIAGNOSIS — J3089 Other allergic rhinitis: Secondary | ICD-10-CM

## 2015-07-31 DIAGNOSIS — I10 Essential (primary) hypertension: Secondary | ICD-10-CM | POA: Diagnosis not present

## 2015-07-31 MED ORDER — HYDROCOD POLST-CPM POLST ER 10-8 MG/5ML PO SUER: 2.5000 mL | Freq: Two times a day (BID) | ORAL | Status: DC

## 2015-07-31 NOTE — Progress Notes (Signed)
Pre visit review using our clinic review tool, if applicable. No additional management support is needed unless otherwise documented below in the visit note. 

## 2015-07-31 NOTE — Telephone Encounter (Signed)
Returned call. Discussed BP's w patient. She notes concern bc her BP has not been as low as 114/70 before, when checked. I do note trend of 031Y/ systolic previously.  She denied acute symptoms other than the sinus/URI symptoms and general fatigue. We discussed recommendation by fam med to switch allergy medication and I confirmed information given. I advised that since she is not having symptoms, for pt not to be concerned about the BP (explained though it is a "low" value for her, this is a normal reading).  I explained that acute illness can be a cause of BP variation as well. Recommended to stay adequately hydrated. She is aware to call if she were to become dizzy or lightheaded on standing or have any noted weakness. She is aware to call if any other new symptoms of concern to her, or if she has further needs.  Pt voiced understanding, and thanks for assistance & info.

## 2015-07-31 NOTE — Telephone Encounter (Signed)
Saw PCP today BP was 114/70-was told to call Dr. Nestor Lewandowsky not having any symptoms  pls call [phone]

## 2015-07-31 NOTE — Progress Notes (Signed)
Subjective:    Patient ID: Diamond Boyd, female    DOB: 02/16/1940, 75 y.o.   MRN: [id number]  HPI Here for f/u of ongoing uri symptoms  tx for sinusitis and then seen for cough in the past several months   flonase- uses daily  Allegra -uses daily  Still has cough (taking tussionex and is about out of it)  Mostly dry  Gets sob with cough fits occasionally and getting sore in chest and back from cough No fever  Still gets a little green d/c out of L nostril Tired   No sinus pain - just fullness    Last cxr   CLINICAL DATA: Cough.  EXAM: CHEST 2 VIEW  COMPARISON: PET-CT 07/10/2015  FINDINGS: Injectable port terminates in the expected location of distal superior vena cava.  Cardiomediastinal silhouette is normal. Mediastinal contours appear intact.  There is no evidence of focal airspace consolidation, pleural effusion or pneumothorax.  Osseous structures are without acute abnormality. Exaggerated thoracic kyphosis with multilevel osteoarthritic changes. Postsurgical changes in the left upper abdomen are noted. Soft tissues are otherwise grossly normal.  IMPRESSION: No active cardiopulmonary disease.   Electronically Signed  By: Fidela Salisbury M.D.  On: 07/17/2015 16:16    BP Readings from Last 3 Encounters:  07/31/15 114/70  07/17/15 122/58  07/11/15 139/78  feels sluggish from her medications  Patient Active Problem List   Diagnosis Date Noted  . Cough 07/18/2015  . Port catheter in place 07/11/2015  . Generalized weakness 05/11/2015  . Benign essential HTN   . PAF (paroxysmal atrial fibrillation) ([place])   . Internal hemorrhoids 02/21/2015  . Rectal bleeding 02/06/2015  . 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 Thrombocytopenia ([place]) 01/29/2015  . AKI (acute kidney injury) ([place])   . Elevated troponin   . Atrial fibrillation with rapid ventricular response ([place]) 01/23/2015  . New onset a-fib ([place]) 01/23/2015  .  Hypertension 01/23/2015  . CHF (congestive heart failure) ([place]) 01/21/2015  . Chronic systolic CHF (congestive heart failure), NYHA class 2 ([place]) 01/11/2015  . Fever, unspecified 01/10/2015  . Immunocompromised status associated with infection ([place]) 01/08/2015  . 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 Preventive measure 11/14/2014  . Parotid mass 11/14/2014  . Chronic lower back pain 09/28/2014  . Mediastinal lymphadenopathy 09/27/2014  . Neuropathy due to chemotherapeutic drug ([place]) 06/19/2014  . Peripheral neuropathy due to chemotherapy ([place]) 04/03/2014  . Trochanteric bursitis of right hip 04/03/2014  . Anemia in chronic illness 02/21/2014  . Elevated liver enzymes 02/21/2014  . Urinary tract infection 11/18/2013  . Candidal intertrigo 09/23/2013  . Atrophic vaginitis 09/23/2013  . Drug-induced neutropenia ([place]) 09/01/2013  . History of colon cancer 06/17/2013  . History of hodgkin's lymphoma 05/20/2013  . Dysuria 05/20/2013  . Conjunctivitis, acute 04/27/2013  . Cold sore 04/27/2013  . Left temporal headache 03/11/2013  . Hyperglycemia 03/11/2013  . Acute maxillary sinusitis 02/09/2013  . Other malaise and fatigue 01/04/2013  . Herpes zoster 11/22/2012  . Vaginal pain 11/22/2012  . Cystocele 09/22/2012  . Encounter for Medicare annual wellness exam 09/17/2012  . Left knee pain 09/10/2012  . Thoracic back pain 06/01/2012  . Skin lesion of face 05/17/2012  . Other screening mammogram 07/22/2011  . Routine gynecological examination 07/22/2011  . Colon cancer screening 07/22/2011  . History of anemia 05/06/2011  . 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  Hypothyroid 02/24/2011  . Varicose veins 02/24/2011  . Elevated blood pressure 11/20/2010  . Obesity 11/20/2010  . 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 Other chronic sinusitis 07/03/2009    . THROAT PAIN, CHRONIC 07/03/2009  . GERD 12/20/2008  . Anxiety state, unspecified 08/22/2008  . MENOPAUSAL SYNDROME 07/13/2007  . HYPERCHOLESTEROLEMIA, PURE 07/08/2006  . Allergic rhinitis 07/08/2006  . OVERACTIVE BLADDER 07/08/2006  . 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 Depression 06/25/2006  . PERIPHERAL VASCULAR DISEASE 06/25/2006  . OSTEOARTHRITIS 06/25/2006  . LEG EDEMA 06/25/2006  . SKIN CANCER, HX OF 06/25/2006  . COLONIC POLYPS, HX OF 06/25/2006   Past Medical History  Diagnosis Date  . Colon polyps   . Depression   . Hypothyroidism   . Osteoarthritis     hands  . Peripheral vascular disease ([place])   . Degenerative disk disease     Thoracic spine  . 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 Cancer ([place])     skin cancer- basal cell on arm / colon 2011  . Lymphoma ([place])   . Colon cancer ([place])     2011, s/p surgery, in remission  . Neuropathy due to chemotherapeutic drug ([place]) 06/19/2014  . hodgkins lymphoma   . Heart murmur   . GERD (gastroesophageal reflux disease)   . Anemia     hx blood tx  . Pneumonia   . 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 Benign essential HTN   . Chronic systolic CHF (congestive heart failure), NYHA class 2 ([place]) 01/2015  . NICM (nonischemic cardiomyopathy) ([place]) 01/2015  . PAF (paroxysmal atrial fibrillation) ([place])   . Generalized weakness 05/11/2015   Past Surgical History  Procedure Laterality Date  . Cholecystectomy    . Splenectomy      lymphoma  . Breast biopsy  1996  . Plantar fascia release    . Ventral hernia repair  1998  . Tendon release      Right thumb   . Colectomy  8/11  . Vaginal hysterectomy    . Bone marrow biopsy  05/27/13  . Cataract extraction, bilateral  2016  . Port-a-cath insertion    . Colonoscopy w/ biopsies    . Dg biopsy lung    . Video bronchoscopy with endobronchial ultrasound N/A 10/17/2014     Procedure: VIDEO BRONCHOSCOPY WITH ENDOBRONCHIAL ULTRASOUND;  Surgeon: Javier Glazier, MD;  Location: [facility];  Service: Thoracic;  Laterality: N/A;  . Video bronchoscopy with endobronchial ultrasound N/A 11/01/2014    Procedure: VIDEO BRONCHOSCOPY WITH ENDOBRONCHIAL ULTRASOUND;  Surgeon: Grace Isaac, MD;  Location: [facility];  Service: Thoracic;  Laterality: N/A;  . Mediastinoscopy N/A 11/01/2014    Procedure: MEDIASTINOSCOPY;  Surgeon: Grace Isaac, MD;  Location: [facility];  Service: Thoracic;  Laterality: N/A;  . Lymph node biopsy N/A 11/01/2014    Procedure: MEDIASTINAL LYMPH NODE BIOPSY;  Surgeon: Grace Isaac, MD;  Location: [facility];  Service: Thoracic;  Laterality: N/A;  . Cardiac catheterization N/A 01/25/2015    Procedure: Left Heart Cath and Coronary Angiography;  Surgeon: Peter M Martinique, MD;  Location: [facility];  Service: Cardiovascular;  Laterality: N/A;   Social History  Substance Use Topics  . Smoking status: Never Smoker   . Smokeless tobacco: Never Used     Comment: Second-hand exposure through father.  . Alcohol Use: No   Family History  Problem Relation Age of Onset  . Coronary artery disease Mother   . Diabetes Mother   . Diabetes Brother   . Fibromyalgia Daughter     chronic pain   . COPD Daughter   . Colon cancer Neg Hx   . Colon polyps Neg Hx   . Stomach cancer Neg Hx   . Rectal cancer Neg Hx   . Ulcerative colitis Neg Hx   . Crohn's disease Neg Hx   . Asthma Daughter   . Rheum arthritis Brother   . Clotting disorder Brother   . Alcohol abuse Sister    Allergies  Allergen Reactions  . Achromycin [Tetracycline Hcl] Other (See Comments)    Pt does not remember reaction  . Allopurinol Other (See Comments)    REACTION: Unsure of reaction happene years ago  . Astelin [Azelastine Hcl] Other (See Comments)    Reaction unknown  . Cephalexin Other (See Comments)    REACTION: unsure of reaction happened yrs ago.  . Codeine Other (See Comments)      REACTION: abd. pain  . Lisinopril Cough  . Meloxicam Other (See Comments)    REACTION: GI symptoms  . Minocycline Other (See Comments)    Abdominal pain  . Nabumetone Other (See Comments)    REACTION: reaction not known  . Nyquil [Pseudoeph-Doxylamine-Dm-Apap] Hives  . Penicillins Other (See Comments)    Reaction unknown Has patient had a PCN reaction causing immediate rash, facial/tongue/throat swelling, SOB or lightheadedness with hypotension: unsure reaction unknown Has patient had a PCN reaction causing severe rash involving mucus membranes or skin necrosis: unsure reaction unknown Has patient had a PCN reaction that required hospitalization no Has patient had a PCN reaction occurring within the last 10 years: no If all of the above answers are "NO", then may proceed with Cephalosporin use.  . Sulfa Antibiotics Other (See Comments)    Gi side eff   . Zolpidem Tartrate Other (See Comments)    REACTION: feels too drugged  . Buspar [Buspirone Hcl] Other (See Comments)    Dizziness, and not as effective for anxiety  . Ciprofloxacin Rash   Current Outpatient Prescriptions on File Prior to Visit  Medication Sig Dispense Refill  . acetaminophen (TYLENOL) 650 MG CR tablet Take 650 mg by mouth every 8 (eight) hours as needed (For back pain.).    Marland Kitchen acyclovir (ZOVIRAX) 400 MG tablet Take 1 tablet (400 mg total) by mouth daily. 90 tablet 3  . aspirin 81 MG tablet Take 81 mg by mouth daily.    . Calcium Carbonate-Vitamin D (CALCIUM 600+D) 600-400 MG-UNIT per tablet Take 1 tablet by mouth every evening.     . celecoxib (CELEBREX) 200 MG capsule Take 1 capsule (200 mg total) by mouth daily as needed for moderate pain. 90 capsule 3  . Cholecalciferol (VITAMIN D-3) 1000 UNITS CAPS Take 1,000 Units by mouth daily.    Marland Kitchen docusate sodium (COLACE) 100 MG capsule Take 100 mg by mouth at bedtime.    Marland Kitchen estradiol (ESTRACE) 1 MG tablet Take 1 tablet (1 mg total) by mouth daily. 90 tablet 3  .  fexofenadine (ALLEGRA) 180 MG tablet Take 1 tablet (180 mg total) by mouth daily. Reported on 04/23/2015 90 tablet 3  . FLUoxetine (PROZAC) 20 MG capsule Take 1 capsule (20 mg total) by mouth daily. 90 capsule 3  . fluticasone (FLONASE) 50 MCG/ACT nasal spray Place 2 sprays into both nostrils daily. 48 g 3  . furosemide (LASIX) 20 MG tablet  Take 1 tablet (20 mg total) by mouth every other day. Take 1 tab by mouth daily for weight gain. 90 tablet 3  . gabapentin (NEURONTIN) 300 MG capsule Take 1 capsule (300 mg total) by mouth 2 (two) times daily. 180 capsule 3  . levothyroxine (SYNTHROID, LEVOTHROID) 112 MCG tablet Take 1 tablet (112 mcg total) by mouth daily before breakfast. 90 tablet 3  . lidocaine-prilocaine (EMLA) cream Apply 1 application topically as needed (For port-a-cath.). Reported on 02/22/2015    . losartan (COZAAR) 25 MG tablet Take 1 tablet (25 mg total) by mouth daily. 30 tablet 11  . metoprolol (LOPRESSOR) 50 MG tablet TAKE 1 TABLET (50 MG TOTAL) BY MOUTH 2 (TWO) TIMES DAILY. 180 tablet 3  . Omega-3 350 MG CAPS Take 1 capsule by mouth daily. Reported on 02/22/2015    . omeprazole (PRILOSEC) 40 MG capsule Take 1 capsule (40 mg total) by mouth daily. 90 capsule 3  . ondansetron (ZOFRAN) 8 MG tablet Take 8 mg by mouth every 6 (six) hours as needed for nausea or vomiting. Reported on 03/05/2015    . polyethylene glycol (MIRALAX / GLYCOLAX) packet Take 17 g by mouth at bedtime.    . prochlorperazine (COMPAZINE) 10 MG tablet Take 10 mg by mouth every 6 (six) hours as needed for nausea or vomiting. Reported on 03/05/2015    . psyllium (METAMUCIL) 58.6 % packet Take 1-3 packets by mouth at bedtime.     . simvastatin (ZOCOR) 40 MG tablet Take 1 tablet (40 mg total) by mouth at bedtime. 90 tablet 0  . vitamin E (VITAMIN E) 400 UNIT capsule Take 400 Units by mouth daily. Reported on 03/05/2015     No current facility-administered medications on file prior to visit.     Review of Systems     Review of Systems  Constitutional: Negative for fever, appetite change, fatigue and unexpected weight change.  ENT pos for rhinorrhea/sneezing/cong and neg for sinus pain  Eyes: Negative for pain and visual disturbance.  Respiratory: Negative for wheeze and shortness of breath.   Cardiovascular: Negative for cp or palpitations    Gastrointestinal: Negative for nausea, diarrhea and constipation.  Genitourinary: Negative for urgency and frequency.  Skin: Negative for pallor or rash   Neurological: Negative for weakness, light-headedness, numbness and headaches.  Hematological: Negative for adenopathy. Does not bruise/bleed easily.  Psychiatric/Behavioral: Negative for dysphoric mood. The patient is not nervous/anxious.      Objective:   Physical Exam  Constitutional: She appears well-developed and well-nourished.  obese and well appearing   HENT:  Head: Normocephalic and atraumatic.  Right Ear: External ear normal.  Left Ear: External ear normal.  Mouth/Throat: Oropharynx is clear and moist. No oropharyngeal exudate.  Nares are boggy Clear rhinorrhea and pnd  No sinus tenderness TMs nl   Eyes: Conjunctivae and EOM are normal. Pupils are equal, round, and reactive to light. Right eye exhibits no discharge. Left eye exhibits no discharge.  Neck: Normal range of motion. Neck supple.  Cardiovascular: Normal rate and regular rhythm.   Pulmonary/Chest: Effort normal and breath sounds normal. No respiratory distress. She has no wheezes. She has no rales. She exhibits no tenderness.  occ dry hacking cough   Musculoskeletal: She exhibits no edema.  Lymphadenopathy:    She has no cervical adenopathy.  Neurological: She is alert.  Skin: Skin is warm and dry. No rash noted. No pallor.  Psychiatric: She has a normal mood and affect.  Assessment & Plan:   Problem List Items Addressed This Visit      Cardiovascular and Mediastinum   Benign essential HTN    BP Readings from  Last 3 Encounters:  07/31/15 114/70  07/17/15 122/58  07/11/15 139/78    Pt will check in with cardiology regarding meds making her feel sluggish        Respiratory   Allergic rhinitis - Primary   Relevant Orders   Ambulatory referral to Allergy

## 2015-07-31 NOTE — Patient Instructions (Addendum)
Call your cardiology office to tell them about your blood pressure (I am unsure if medicines can be changed but check in with them) I will refill your cough medicine- use with caution of sedation and habit - only use if you need it  Try switching allegra to claritin or zyrtec to see if one of them helps more  Continue flonase  Stop at check out for referral to allergist

## 2015-08-01 ENCOUNTER — Ambulatory Visit: Payer: Medicare Other

## 2015-08-01 DIAGNOSIS — M6281 Muscle weakness (generalized): Secondary | ICD-10-CM

## 2015-08-01 NOTE — Therapy (Signed)
[place] MAIN [facility] SERVICES [address] [place], [place], [address] Phone: [phone]   Fax:  [phone]  Physical Therapy Treatment  Patient Details  Name: Diamond Boyd MRN: [id number] Date of Birth: 1940-12-23 Referring Provider: Alvy Bimler  Encounter Date: 08/01/2015      PT End of Session - 08/01/15 1124    Visit Number 6   Number of Visits 13   Date for PT Re-Evaluation 08/13/15   Authorization Type 6/10   PT Start Time 1120   PT Stop Time 1200   PT Time Calculation (min) 40 min   Equipment Utilized During Treatment Gait belt   Activity Tolerance Patient tolerated treatment well   Behavior During Therapy [facility] for tasks assessed/performed      Past Medical History  Diagnosis Date  . Colon polyps   . Depression   . Hypothyroidism   . Osteoarthritis     hands  . Peripheral vascular disease ([place])   . Degenerative disk disease     Thoracic spine  . 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 Cancer ([place])     skin cancer- basal cell on arm / colon 2011  . Lymphoma ([place])   . Colon cancer ([place])     2011, s/p surgery, in remission  . Neuropathy due to chemotherapeutic drug ([place]) 06/19/2014  . hodgkins lymphoma   . Heart murmur   . GERD (gastroesophageal reflux disease)   . Anemia     hx blood tx  . Pneumonia   . 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 Benign essential HTN   . Chronic systolic CHF (congestive heart failure), NYHA class 2 ([place]) 01/2015  . NICM (nonischemic cardiomyopathy) ([place]) 01/2015  . PAF (paroxysmal atrial fibrillation) ([place])   . Generalized weakness 05/11/2015    Past Surgical History  Procedure Laterality Date  . Cholecystectomy    . Splenectomy      lymphoma  . Breast biopsy  1996  . Plantar fascia release    . Ventral hernia repair  1998  . Tendon release      Right thumb   . Colectomy  8/11  .  Vaginal hysterectomy    . Bone marrow biopsy  05/27/13  . Cataract extraction, bilateral  2016  . Port-a-cath insertion    . Colonoscopy w/ biopsies    . Dg biopsy lung    . Video bronchoscopy with endobronchial ultrasound N/A 10/17/2014    Procedure: VIDEO BRONCHOSCOPY WITH ENDOBRONCHIAL ULTRASOUND;  Surgeon: Javier Glazier, MD;  Location: [facility];  Service: Thoracic;  Laterality: N/A;  . Video bronchoscopy with endobronchial ultrasound N/A 11/01/2014    Procedure: VIDEO BRONCHOSCOPY WITH ENDOBRONCHIAL ULTRASOUND;  Surgeon: Grace Isaac, MD;  Location: [facility];  Service: Thoracic;  Laterality: N/A;  . Mediastinoscopy N/A 11/01/2014    Procedure: MEDIASTINOSCOPY;  Surgeon: Grace Isaac, MD;  Location: [facility];  Service: Thoracic;  Laterality: N/A;  . Lymph node biopsy N/A 11/01/2014    Procedure: MEDIASTINAL LYMPH NODE BIOPSY;  Surgeon: Grace Isaac, MD;  Location: [facility];  Service: Thoracic;  Laterality: N/A;  . Cardiac catheterization N/A 01/25/2015    Procedure: Left Heart Cath and Coronary Angiography;  Surgeon: Peter M Martinique, MD;  Location: [facility];  Service: Cardiovascular;  Laterality: N/A;    There were no vitals filed for this visit.  Subjective Assessment - 08/01/15 1122    Subjective pt states that she doesn't feel too good today and that her legs are sore from last session. States the doctor thinks she has allergies so she is going to see them today.   How long can you stand comfortably? 54mn   Patient Stated Goals get stronger, walk farther   Currently in Pain? No/denies       THEREX Nustep UE and LE x 5 min L3 (unbilled) wall slides with big therapy ball 2 x 10; min A for balance and verbal cues for proper technique; RLE quad fatigue mini lunges with front foot on bosu, 1x10 BLE; CGA for balance; RLE quad fatigue; min cues for proper technique bil fwd/lateral heel taps on 4" step 1x10 each; min verbal cues for proper technique; RLE fatigue  demonstated at end of exercise  NMR bil NBOS stance on airex and EC x 5 min; CGA for balance star drill on airex x 3 BLE; CGA-mod A throughout to prevent LOB          PT Education - 08/01/15 1124    Education provided Yes   Education Details exercise technique   Person(s) Educated Patient   Methods Explanation   Comprehension Verbalized understanding             PT Long Term Goals - 07/23/15 1235    PT LONG TERM GOAL #1   Title pt will perform 5x sit to stand in <15s to improve LE strength    Time 4   Period Weeks   Status Partially Met   PT LONG TERM GOAL #2   Title pt will ambulate at 1.0355m to improve her community mobility    Time 4   Period Weeks   Status Partially Met   PT LONG TERM GOAL #3   Title pt will improve 55m71mwalk by [id number] improved activity tolerance.    Baseline [id number] 1 seated rest break    Time 4   Period Weeks   Status On-going   PT LONG TERM GOAL #4   Title pt will increase berg balance score +6 pts in order to reduce fall risk    Time 4   Period Weeks   Status New               Plan - 08/01/15 1202    Clinical Impression Statement pt continues to make progress as evidenced by improved balance, however, she is still unsteady and requires assistance to prevent LOB when performing dynamic balance exercises. She also demonstrated R quad fatigue throughout all balance and strengthening exercises. Plan to perform balance exercises outside of // bars next session to further promote ankle strategies for balance. Pt needs continued skilled PT intervention to address remaining deficits in order to maximize overall function and decrease risk of falls.    Rehab Potential Good   Clinical Impairments Affecting Rehab Potential co-morbidities   PT Frequency 2x / week   PT Duration 4 weeks   PT Treatment/Interventions Neuromuscular re-education;Balance training;Therapeutic exercise;Therapeutic activities;Functional mobility  training;Stair training;Gait training;Manual lymph drainage;Passive range of motion   PT Next Visit Plan HEP   Consulted and Agree with Plan of Care Patient      Patient will benefit from skilled therapeutic intervention in order to improve the following deficits and impairments:  Decreased strength, Difficulty walking, Impaired flexibility, Pain, Decreased coordination, Decreased endurance, Decreased balance, Impaired sensation, Decreased activity tolerance  Visit Diagnosis: Muscle weakness (generalized)  Problem List Patient Active Problem List   Diagnosis Date Noted  . Cough 07/18/2015  . Port catheter in place 07/11/2015  . Generalized weakness 05/11/2015  . Benign essential HTN   . PAF (paroxysmal atrial fibrillation) ([place])   . Internal hemorrhoids 02/21/2015  . Rectal bleeding 02/06/2015  . 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 Thrombocytopenia ([place]) 01/29/2015  . AKI (acute kidney injury) ([place])   . Elevated troponin   . Atrial fibrillation with rapid ventricular response ([place]) 01/23/2015  . New onset a-fib ([place]) 01/23/2015  . Hypertension 01/23/2015  . CHF (congestive heart failure) ([place]) 01/21/2015  . Chronic systolic CHF (congestive heart failure), NYHA class 2 ([place]) 01/11/2015  . Fever, unspecified 01/10/2015  . Immunocompromised status associated with infection ([place]) 01/08/2015  . 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 Preventive measure 11/14/2014  . Parotid mass 11/14/2014  . Chronic lower back pain 09/28/2014  . Mediastinal lymphadenopathy 09/27/2014  . Neuropathy due to chemotherapeutic drug ([place]) 06/19/2014  . Peripheral neuropathy due to chemotherapy ([place]) 04/03/2014  . Trochanteric bursitis of right hip 04/03/2014  . Anemia in chronic illness 02/21/2014  . Elevated liver enzymes 02/21/2014  . Urinary tract infection  11/18/2013  . Candidal intertrigo 09/23/2013  . Atrophic vaginitis 09/23/2013  . Drug-induced neutropenia ([place]) 09/01/2013  . History of colon cancer 06/17/2013  . History of hodgkin's lymphoma 05/20/2013  . Dysuria 05/20/2013  . Conjunctivitis, acute 04/27/2013  . Cold sore 04/27/2013  . Left temporal headache 03/11/2013  . Hyperglycemia 03/11/2013  . Acute maxillary sinusitis 02/09/2013  . Other malaise and fatigue 01/04/2013  . Herpes zoster 11/22/2012  . Vaginal pain 11/22/2012  . Cystocele 09/22/2012  . Encounter for Medicare annual wellness exam 09/17/2012  . Left knee pain 09/10/2012  . Thoracic back pain 06/01/2012  . Skin lesion of face 05/17/2012  . Other screening mammogram 07/22/2011  . Routine gynecological examination 07/22/2011  . Colon cancer screening 07/22/2011  . History of anemia 05/06/2011  . 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 Hypothyroid 02/24/2011  . Varicose veins 02/24/2011  . Elevated blood pressure 11/20/2010  . Obesity 11/20/2010  . 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 Other chronic sinusitis 07/03/2009  . THROAT PAIN, CHRONIC 07/03/2009  . GERD 12/20/2008  . Anxiety state, unspecified 08/22/2008  . MENOPAUSAL SYNDROME 07/13/2007  . HYPERCHOLESTEROLEMIA, PURE 07/08/2006  . Allergic rhinitis 07/08/2006  . OVERACTIVE BLADDER 07/08/2006  . 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 Depression 06/25/2006  . PERIPHERAL VASCULAR DISEASE 06/25/2006  . OSTEOARTHRITIS 06/25/2006  . LEG EDEMA 06/25/2006  . SKIN CANCER, HX OF 06/25/2006  . COLONIC POLYPS, HX OF 06/25/2006   Geraldine Solar, SPT  This entire session was performed under direct supervision and direction of a licensed therapist/therapist assistant . I have personally read, edited and approve of the note as written. Gorden Harms. Tortorici, PT, DPT [phone]   Tortorici,Ashley 08/01/2015, 12:57 PM  [place] MAIN [facility] SERVICES [address]  [place], [place], [address] Phone: [phone]   Fax:  [phone]  Name: Diamond Boyd MRN: [id number] Date of Birth: November 03, 1940

## 2015-08-02 DIAGNOSIS — J301 Allergic rhinitis due to pollen: Secondary | ICD-10-CM | POA: Diagnosis not present

## 2015-08-02 DIAGNOSIS — J3089 Other allergic rhinitis: Secondary | ICD-10-CM | POA: Diagnosis not present

## 2015-08-02 DIAGNOSIS — R05 Cough: Secondary | ICD-10-CM | POA: Diagnosis not present

## 2015-08-02 DIAGNOSIS — J019 Acute sinusitis, unspecified: Secondary | ICD-10-CM | POA: Diagnosis not present

## 2015-08-02 NOTE — Assessment & Plan Note (Signed)
BP Readings from Last 3 Encounters:  07/31/15 114/70  07/17/15 122/58  07/11/15 139/78    Pt will check in with cardiology regarding meds making her feel sluggish

## 2015-08-02 NOTE — Assessment & Plan Note (Signed)
Ongoing nasal symptoms and cough- pt has hx of recurrent sinusitis and was tx recently  Fairly nl exam  No other cause found Has not had allergy immunotx in several years because of cancer treatment  Disc change of antihistamine for a trial to claritin or zyrtec  Ref to allergist for a f/u visit Will update if fever or sinus pain or other symptoms develop

## 2015-08-03 ENCOUNTER — Other Ambulatory Visit: Payer: Self-pay | Admitting: Family Medicine

## 2015-08-06 ENCOUNTER — Ambulatory Visit: Payer: Medicare Other

## 2015-08-06 DIAGNOSIS — M6281 Muscle weakness (generalized): Secondary | ICD-10-CM

## 2015-08-06 NOTE — Therapy (Signed)
[place] MAIN [facility] SERVICES [address] [place], [place], [address] Phone: [phone]   Fax:  [phone]  Physical Therapy Treatment  Patient Details  Name: Diamond Boyd MRN: [id number] Date of Birth: 05/05/1940 Referring Provider: Alvy Bimler  Encounter Date: 08/06/2015      PT End of Session - 08/06/15 1201    Visit Number 7   Number of Visits 13   Date for PT Re-Evaluation 08/13/15   Authorization Type 7/10   PT Start Time 1115   PT Stop Time 1200   PT Time Calculation (min) 45 min   Equipment Utilized During Treatment Gait belt   Activity Tolerance Patient tolerated treatment well   Behavior During Therapy [facility] for tasks assessed/performed      Past Medical History  Diagnosis Date  . Colon polyps   . Depression   . Hypothyroidism   . Osteoarthritis     hands  . Peripheral vascular disease ([place])   . Degenerative disk disease     Thoracic spine  . 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 Cancer ([place])     skin cancer- basal cell on arm / colon 2011  . Lymphoma ([place])   . Colon cancer ([place])     2011, s/p surgery, in remission  . Neuropathy due to chemotherapeutic drug ([place]) 06/19/2014  . hodgkins lymphoma   . Heart murmur   . GERD (gastroesophageal reflux disease)   . Anemia     hx blood tx  . Pneumonia   . 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 Benign essential HTN   . Chronic systolic CHF (congestive heart failure), NYHA class 2 ([place]) 01/2015  . NICM (nonischemic cardiomyopathy) ([place]) 01/2015  . PAF (paroxysmal atrial fibrillation) ([place])   . Generalized weakness 05/11/2015    Past Surgical History  Procedure Laterality Date  . Cholecystectomy    . Splenectomy      lymphoma  . Breast biopsy  1996  . Plantar fascia release    . Ventral hernia repair  1998  . Tendon release      Right thumb   . Colectomy  8/11  .  Vaginal hysterectomy    . Bone marrow biopsy  05/27/13  . Cataract extraction, bilateral  2016  . Port-a-cath insertion    . Colonoscopy w/ biopsies    . Dg biopsy lung    . Video bronchoscopy with endobronchial ultrasound N/A 10/17/2014    Procedure: VIDEO BRONCHOSCOPY WITH ENDOBRONCHIAL ULTRASOUND;  Surgeon: Javier Glazier, MD;  Location: [place];  Service: Thoracic;  Laterality: N/A;  . Video bronchoscopy with endobronchial ultrasound N/A 11/01/2014    Procedure: VIDEO BRONCHOSCOPY WITH ENDOBRONCHIAL ULTRASOUND;  Surgeon: Grace Isaac, MD;  Location: [place];  Service: Thoracic;  Laterality: N/A;  . Mediastinoscopy N/A 11/01/2014    Procedure: MEDIASTINOSCOPY;  Surgeon: Grace Isaac, MD;  Location: [place];  Service: Thoracic;  Laterality: N/A;  . Lymph node biopsy N/A 11/01/2014    Procedure: MEDIASTINAL LYMPH NODE BIOPSY;  Surgeon: Grace Isaac, MD;  Location: [place];  Service: Thoracic;  Laterality: N/A;  . Cardiac catheterization N/A 01/25/2015    Procedure: Left Heart Cath and Coronary Angiography;  Surgeon: Peter M Martinique, MD;  Location: [place] CV [facility];  Service: Cardiovascular;  Laterality: N/A;    There were no vitals filed for this visit.  Subjective Assessment - 08/06/15 1120    Subjective pt reports feeling "crummy" today still. States the doctor gave her a steroid shot for allergies last week.   How long can you stand comfortably? 60mn   Patient Stated Goals get stronger, walk farther   Currently in Pain? No/denies       THEREX Nustep x 5 min, L3 (unbilled) wall slides with big therapy ball, 2x10; CGA -min A for balance  sit <> stand with RLE elevated on 4" step with 1 UE support, 3x5; CGA, demonstrated fatigue of RLE towards end of exercise  NMR bil staggered stance with front foot on dynadisc with trunk rotation/ball handoff, 1x10 each way; CGA - min A throughout for balance; RLE back more difficult  bil step up with march on airex pad on 4" step  1x10 with 1UE support, 1x10 without UE support; CGA - min A throughout and pt requiring UE support to maintain balance; increased hip strategy to maintain balance fwd/retro tandem walking on long airex pad, no UE support with fwd, and 1 UE support for retro x 10 each; CGA throughout       PT Education - 08/06/15 1201    Education provided Yes   Education Details exercise technique   Person(s) Educated Patient   Methods Explanation   Comprehension Verbalized understanding             PT Long Term Goals - 07/23/15 1235    PT LONG TERM GOAL #1   Title pt will perform 5x sit to stand in <15s to improve LE strength    Time 4   Period Weeks   Status Partially Met   PT LONG TERM GOAL #2   Title pt will ambulate at 1.0110m to improve her community mobility    Time 4   Period Weeks   Status Partially Met   PT LONG TERM GOAL #3   Title pt will improve 64m4mwalk by [id number] improved activity tolerance.    Baseline [id number] 1 seated rest break    Time 4   Period Weeks   Status On-going   PT LONG TERM GOAL #4   Title pt will increase berg balance score +6 pts in order to reduce fall risk    Time 4   Period Weeks   Status New               Plan - 08/06/15 1201    Clinical Impression Statement pt tolerated progressed strengthening and balance exercises well. pt still demonstrates RLE weakness > LLE as well as dynamic balance deficits. pt had slight LOBs with dynamic balance exercises and required assistance to maintain balance. pt needs continued skilled PT intervention to address remaining deficits in order to maximize independence and decrease risk of falls.   Rehab Potential Good   Clinical Impairments Affecting Rehab Potential co-morbidities   PT Frequency 2x / week   PT Duration 4 weeks   PT Treatment/Interventions Neuromuscular re-education;Balance training;Therapeutic exercise;Therapeutic activities;Functional mobility training;Stair training;Gait  training;Manual lymph drainage;Passive range of motion   PT Next Visit Plan HEP   Consulted and Agree with Plan of Care Patient      Patient will benefit from skilled therapeutic intervention in order to improve the following deficits and impairments:  Decreased strength, Difficulty walking, Impaired flexibility, Pain, Decreased coordination, Decreased endurance, Decreased balance, Impaired sensation, Decreased activity tolerance  Visit Diagnosis: Muscle weakness (generalized)     Problem List Patient Active Problem List   Diagnosis  Date Noted  . Cough 07/18/2015  . Port catheter in place 07/11/2015  . Generalized weakness 05/11/2015  . Benign essential HTN   . PAF (paroxysmal atrial fibrillation) ([place])   . Internal hemorrhoids 02/21/2015  . Rectal bleeding 02/06/2015  . 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 Thrombocytopenia ([place]) 01/29/2015  . AKI (acute kidney injury) ([place])   . Elevated troponin   . Atrial fibrillation with rapid ventricular response ([place]) 01/23/2015  . New onset a-fib ([place]) 01/23/2015  . Hypertension 01/23/2015  . CHF (congestive heart failure) ([place]) 01/21/2015  . Chronic systolic CHF (congestive heart failure), NYHA class 2 ([place]) 01/11/2015  . Fever, unspecified 01/10/2015  . Immunocompromised status associated with infection ([place]) 01/08/2015  . 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 Preventive measure 11/14/2014  . Parotid mass 11/14/2014  . Chronic lower back pain 09/28/2014  . Mediastinal lymphadenopathy 09/27/2014  . Neuropathy due to chemotherapeutic drug ([place]) 06/19/2014  . Peripheral neuropathy due to chemotherapy ([place]) 04/03/2014  . Trochanteric bursitis of right hip 04/03/2014  . Anemia in chronic illness 02/21/2014  . Elevated liver enzymes 02/21/2014  . Urinary tract infection 11/18/2013  . Candidal  intertrigo 09/23/2013  . Atrophic vaginitis 09/23/2013  . Drug-induced neutropenia ([place]) 09/01/2013  . History of colon cancer 06/17/2013  . History of hodgkin's lymphoma 05/20/2013  . Dysuria 05/20/2013  . Conjunctivitis, acute 04/27/2013  . Cold sore 04/27/2013  . Left temporal headache 03/11/2013  . Hyperglycemia 03/11/2013  . Acute maxillary sinusitis 02/09/2013  . Other malaise and fatigue 01/04/2013  . Herpes zoster 11/22/2012  . Vaginal pain 11/22/2012  . Cystocele 09/22/2012  . Encounter for Medicare annual wellness exam 09/17/2012  . Left knee pain 09/10/2012  . Thoracic back pain 06/01/2012  . Skin lesion of face 05/17/2012  . Other screening mammogram 07/22/2011  . Routine gynecological examination 07/22/2011  . Colon cancer screening 07/22/2011  . History of anemia 05/06/2011  . 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 Hypothyroid 02/24/2011  . Varicose veins 02/24/2011  . Elevated blood pressure 11/20/2010  . Obesity 11/20/2010  . 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 Other chronic sinusitis 07/03/2009  . THROAT PAIN, CHRONIC 07/03/2009  . GERD 12/20/2008  . Anxiety state, unspecified 08/22/2008  . MENOPAUSAL SYNDROME 07/13/2007  . HYPERCHOLESTEROLEMIA, PURE 07/08/2006  . Allergic rhinitis 07/08/2006  . OVERACTIVE BLADDER 07/08/2006  . 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 Depression 06/25/2006  . PERIPHERAL VASCULAR DISEASE 06/25/2006  . OSTEOARTHRITIS 06/25/2006  . LEG EDEMA 06/25/2006  . SKIN CANCER, HX OF 06/25/2006  . COLONIC POLYPS, HX OF 06/25/2006    Geraldine Solar, SPT This entire session was performed under direct supervision and direction of a licensed therapist/therapist assistant . I have personally read, edited and approve of the note as written. Gorden Harms. Tortorici, PT, DPT [phone]   Tortorici,Ashley 08/06/2015, 1:26 PM  [place] MAIN [facility] SERVICES [address] [place], [place],  [address] Phone: [phone]   Fax:  [phone]  Name: Diamond Boyd MRN: [id number] Date of Birth: 06-18-1940

## 2015-08-08 ENCOUNTER — Ambulatory Visit: Payer: Medicare Other

## 2015-08-10 DIAGNOSIS — J3089 Other allergic rhinitis: Secondary | ICD-10-CM | POA: Diagnosis not present

## 2015-08-10 DIAGNOSIS — J301 Allergic rhinitis due to pollen: Secondary | ICD-10-CM | POA: Diagnosis not present

## 2015-08-13 ENCOUNTER — Ambulatory Visit: Payer: Medicare Other | Attending: Hematology and Oncology

## 2015-08-13 DIAGNOSIS — M6281 Muscle weakness (generalized): Secondary | ICD-10-CM | POA: Diagnosis not present

## 2015-08-13 NOTE — Therapy (Addendum)
[place] MAIN [facility] SERVICES [address] [place], [place], [address] Phone: [phone]   Fax:  [phone]  Physical Therapy Treatment/Progress Note  Patient Details  Name: Diamond Boyd MRN: [id number] Date of Birth: Nov 02, 1940 Referring Provider: Alvy Bimler  Encounter Date: 08/13/2015      PT End of Session - 08/13/15 1025    Visit Number 8   Number of Visits 25   Date for PT Re-Evaluation 09/10/15   Authorization Type 8/10   PT Start Time 1018   PT Stop Time 1100   PT Time Calculation (min) 42 min   Equipment Utilized During Treatment Gait belt   Activity Tolerance Patient tolerated treatment well   Behavior During Therapy [facility] for tasks assessed/performed      Past Medical History  Diagnosis Date  . Colon polyps   . Depression   . Hypothyroidism   . Osteoarthritis     hands  . Peripheral vascular disease ([place])   . Degenerative disk disease     Thoracic spine  . 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 Cancer ([place])     skin cancer- basal cell on arm / colon 2011  . Lymphoma ([place])   . Colon cancer ([place])     2011, s/p surgery, in remission  . Neuropathy due to chemotherapeutic drug ([place]) 06/19/2014  . hodgkins lymphoma   . Heart murmur   . GERD (gastroesophageal reflux disease)   . Anemia     hx blood tx  . Pneumonia   . 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 Benign essential HTN   . Chronic systolic CHF (congestive heart failure), NYHA class 2 ([place]) 01/2015  . NICM (nonischemic cardiomyopathy) ([place]) 01/2015  . PAF (paroxysmal atrial fibrillation) ([place])   . Generalized weakness 05/11/2015    Past Surgical History  Procedure Laterality Date  . Cholecystectomy    . Splenectomy      lymphoma  . Breast biopsy  1996  . Plantar fascia release    . Ventral hernia repair  1998  . Tendon release      Right thumb   . Colectomy   8/11  . Vaginal hysterectomy    . Bone marrow biopsy  05/27/13  . Cataract extraction, bilateral  2016  . Port-a-cath insertion    . Colonoscopy w/ biopsies    . Dg biopsy lung    . Video bronchoscopy with endobronchial ultrasound N/A 10/17/2014    Procedure: VIDEO BRONCHOSCOPY WITH ENDOBRONCHIAL ULTRASOUND;  Surgeon: Javier Glazier, MD;  Location: [place];  Service: Thoracic;  Laterality: N/A;  . Video bronchoscopy with endobronchial ultrasound N/A 11/01/2014    Procedure: VIDEO BRONCHOSCOPY WITH ENDOBRONCHIAL ULTRASOUND;  Surgeon: Grace Isaac, MD;  Location: [place];  Service: Thoracic;  Laterality: N/A;  . Mediastinoscopy N/A 11/01/2014    Procedure: MEDIASTINOSCOPY;  Surgeon: Grace Isaac, MD;  Location: [place];  Service: Thoracic;  Laterality: N/A;  . Lymph node biopsy N/A 11/01/2014    Procedure: MEDIASTINAL LYMPH NODE BIOPSY;  Surgeon: Grace Isaac, MD;  Location: [place];  Service: Thoracic;  Laterality: N/A;  . Cardiac catheterization N/A 01/25/2015    Procedure: Left Heart Cath and Coronary Angiography;  Surgeon: Peter M Martinique, MD;  Location: [place] CV LAB;  Service: Cardiovascular;  Laterality: N/A;    There were no vitals filed for this visit.  Subjective Assessment - 08/13/15 1021    Subjective pt reports that she was very sore and fatigued after last treatment session.    How long can you stand comfortably? 96mn   Patient Stated Goals get stronger, walk farther   Currently in Pain? No/denies      THEREX SPT reassessed outcome meaures and goals as follows:      [facility] PT Assessment - 08/13/15 0001    Standardized Balance Assessment   Standardized Balance Assessment Dynamic Gait Index   Five times sit to stand comments  22.9 sec   10 Meter Walk 1.10 m/s   Berg Balance Test   Sit to Stand Able to stand without using hands and stabilize independently   Standing Unsupported Able to stand safely 2 minutes   Sitting with Back Unsupported but Feet  Supported on Floor or Stool Able to sit safely and securely 2 minutes   Stand to Sit Sits safely with minimal use of hands   Transfers Able to transfer safely, minor use of hands   Standing Unsupported with Eyes Closed Able to stand 10 seconds safely   Standing Ubsupported with Feet Together Able to place feet together independently and stand 1 minute safely   From Standing, Reach Forward with Outstretched Arm Can reach forward >12 cm safely (5")   From Standing Position, Pick up Object from Floor Able to pick up shoe safely and easily   From Standing Position, Turn to Look Behind Over each Shoulder Looks behind from both sides and weight shifts well   Turn 360 Degrees Able to turn 360 degrees safely in 4 seconds or less   Standing Unsupported, Alternately Place Feet on Step/Stool Able to stand independently and safely and complete 8 steps in 20 seconds   Standing Unsupported, One Foot in Front Able to plae foot ahead of the other independently and hold 30 seconds   Standing on One Leg Able to lift leg independently and hold equal to or more than 3 seconds   Total Score 52   Dynamic Gait Index   Level Surface Normal   Change in Gait Speed Mild Impairment   Gait with Horizontal Head Turns Moderate Impairment   Gait with Vertical Head Turns Mild Impairment   Gait and Pivot Turn Normal   Step Over Obstacle Mild Impairment   Step Around Obstacles Normal   Steps Mild Impairment   Total Score 18         NMR bil cone taps (x3) while on airex, x5 each; CGA - min A for balance, more difficulty on RLE bil side stepping with no UE support and fwd/retro tandem with no UE support for fwd and 1 UE for retro; CGA - min A for balance; demonstrates increased hip flexion to maintain balance throughout bil narrow staggered stance on airex pad with UE chest press and OH flexion with 3# weight bar x 10 each; min A for balance, pt c/o UE fatigue at end        PT Education - 08/13/15 1023    Education  provided Yes   Education Details exercise technique, weight shifting   Person(s) Educated Patient   Methods Explanation   Comprehension Verbalized understanding             PT Long Term Goals - 08/13/15 1201    PT LONG TERM GOAL #1   Title pt will perform 5x sit to stand in <15s to improve LE strength    Baseline 22.9 s (08/13/15)  Time 4   Period Weeks   Status Partially Met   PT LONG TERM GOAL #2   Title pt will ambulate at 1.12ms to improve her community mobility    Baseline 1.1483m (08/2015-07-29  Time 4   Period Weeks   Status Achieved   PT LONG TERM GOAL #3   Title pt will improve 83m79mwalk by 150f51fmonstrating improved activity tolerance.    Baseline 740ft66fh 1 seated rest break    Time 4   Period Weeks   Status On-going   PT LONG TERM GOAL #4   Title pt will increase berg balance score +6 pts in order to reduce fall risk    Baseline 52/56 (7/3/107/29/2017ime 4   Period Weeks   Status Achieved   PT LONG TERM GOAL #5   Title pt will improve DGI score by +3 pts to demonstrated improved dynamic balance and decrease risk for falls.   Baseline 18/24 (7/3/1Jul 29, 2017ime 4   Period Weeks   Status New               Plan - 07/032017-07-29    Clinical Impression Statement pt's outcome measures reassessed today and pt has met 2 goals and partially met 2 goals. pt balance has improved as demonstrated by her BERG balance score increase to 52/56 points and her gait speed has improved to 1.1 m/s. pt still demonstrates the most difficulty with NBOS and SLS balance activities but both have improved since last progress note. DGI performed today to assess dynamic balance; she scored 18/24 indicating she is still at risk for falls. pt still demonstrates deficits in muscle strength, fatigue and dynamic balance and pt needs continued skilled PT intervention (2x/week for 4 more weeks) to address remaining deficits in order to maximize overall function and decrease risk for falls.   Rehab  Potential Good   Clinical Impairments Affecting Rehab Potential co-morbidities   PT Frequency 2x / week   PT Duration 4 weeks   PT Treatment/Interventions Neuromuscular re-education;Balance training;Therapeutic exercise;Therapeutic activities;Functional mobility training;Stair training;Gait training;Manual lymph drainage;Passive range of motion   PT Next Visit Plan HEP   Consulted and Agree with Plan of Care Patient      Patient will benefit from skilled therapeutic intervention in order to improve the following deficits and impairments:  Decreased strength, Difficulty walking, Impaired flexibility, Pain, Decreased coordination, Decreased endurance, Decreased balance, Impaired sensation, Decreased activity tolerance  Visit Diagnosis: Muscle weakness (generalized)       G-Codes - 08/327-Jul-2017    Functional Assessment Tool Used 5xsittostand/DGI/10mwa19m Functional Limitation Mobility: Walking and moving around   Mobility: Walking and Moving Around Current Status ([phone] 20 percent but less than 40 percent impaired, limited or restricted   Mobility: Walking and Moving Around Goal Status ([id number] 1 percent but less than 20 percent impaired, limited or restricted      Problem List Patient Active Problem List   Diagnosis Date Noted  . Cough 07/18/2015  . Port catheter in place 07/11/2015  . Generalized weakness 05/11/2015  . Benign essential HTN   . PAF (paroxysmal atrial fibrillation) (HCC)  [place] hemorrhoids 02/21/2015  . Rectal bleeding 02/06/2015  . 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 Thrombocytopenia (HCC) 1[place]9/2016  . AKI (acute kidney injury) (HCC)  [place]levated troponin   . Atrial fibrillation with rapid ventricular response (HCC) 1[place]3/2016  . New onset a-fib (HCC) 1[place]3/2016  .  Hypertension 01/23/2015  . CHF (congestive heart failure) ([place]) 01/21/2015  . Chronic systolic CHF (congestive heart failure), NYHA class 2 ([place]) 01/11/2015  .  Fever, unspecified 01/10/2015  . Immunocompromised status associated with infection ([place]) 01/08/2015  . 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 Preventive measure 11/14/2014  . Parotid mass 11/14/2014  . Chronic lower back pain 09/28/2014  . Mediastinal lymphadenopathy 09/27/2014  . Neuropathy due to chemotherapeutic drug ([place]) 06/19/2014  . Peripheral neuropathy due to chemotherapy ([place]) 04/03/2014  . Trochanteric bursitis of right hip 04/03/2014  . Anemia in chronic illness 02/21/2014  . Elevated liver enzymes 02/21/2014  . Urinary tract infection 11/18/2013  . Candidal intertrigo 09/23/2013  . Atrophic vaginitis 09/23/2013  . Drug-induced neutropenia ([place]) 09/01/2013  . History of colon cancer 06/17/2013  . History of hodgkin's lymphoma 05/20/2013  . Dysuria 05/20/2013  . Conjunctivitis, acute 04/27/2013  . Cold sore 04/27/2013  . Left temporal headache 03/11/2013  . Hyperglycemia 03/11/2013  . Acute maxillary sinusitis 02/09/2013  . Other malaise and fatigue 01/04/2013  . Herpes zoster 11/22/2012  . Vaginal pain 11/22/2012  . Cystocele 09/22/2012  . Encounter for Medicare annual wellness exam 09/17/2012  . Left knee pain 09/10/2012  . Thoracic back pain 06/01/2012  . Skin lesion of face 05/17/2012  . Other screening mammogram 07/22/2011  . Routine gynecological examination 07/22/2011  . Colon cancer screening 07/22/2011  . History of anemia 05/06/2011  . 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 Hypothyroid 02/24/2011  . Varicose veins 02/24/2011  . Elevated blood pressure 11/20/2010  . Obesity 11/20/2010  . 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 Other chronic sinusitis 07/03/2009  . THROAT PAIN, CHRONIC 07/03/2009  . GERD 12/20/2008  . Anxiety state, unspecified 08/22/2008  . MENOPAUSAL SYNDROME 07/13/2007  . HYPERCHOLESTEROLEMIA, PURE  07/08/2006  . Allergic rhinitis 07/08/2006  . OVERACTIVE BLADDER 07/08/2006  . 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 Depression 06/25/2006  . PERIPHERAL VASCULAR DISEASE 06/25/2006  . OSTEOARTHRITIS 06/25/2006  . LEG EDEMA 06/25/2006  . SKIN CANCER, HX OF 06/25/2006  . COLONIC POLYPS, HX OF 06/25/2006   Geraldine Solar, SPT This entire session was performed under direct supervision and direction of a licensed therapist/therapist assistant . I have personally read, edited and approve of the note as written. Gorden Harms. Tortorici, PT, DPT [phone]  Tortorici,Ashley 08/13/2015, 5:29 PM  [place] [facility] MAIN [facility] SERVICES [address] [place], [place], [address] Phone: [phone]   Fax:  [phone]  Name: LETZY GULLICKSON MRN: [id number] Date of Birth: [birth year]

## 2015-08-15 ENCOUNTER — Ambulatory Visit: Payer: Medicare Other

## 2015-08-15 DIAGNOSIS — M6281 Muscle weakness (generalized): Secondary | ICD-10-CM

## 2015-08-15 NOTE — Therapy (Signed)
[place] MAIN [facility] SERVICES [address] [place], [place], [address] Phone: [phone]   Fax:  [phone]  Physical Therapy Treatment  Patient Details  Name: Diamond Boyd MRN: [id number] Date of Birth: [birth year] Referring Provider: Alvy Bimler  Encounter Date: 08/15/2015      PT End of Session - 08/15/15 1118    Visit Number 9   Number of Visits 25   Date for PT Re-Evaluation 09/10/15   Authorization Type 1/10   PT Start Time 1115   PT Stop Time 1200   PT Time Calculation (min) 45 min   Equipment Utilized During Treatment Gait belt   Activity Tolerance Patient tolerated treatment well   Behavior During Therapy [facility] for tasks assessed/performed      Past Medical History  Diagnosis Date  . Colon polyps   . Depression   . Hypothyroidism   . Osteoarthritis     hands  . Peripheral vascular disease ([place])   . Degenerative disk disease     Thoracic spine  . 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 Cancer ([place])     skin cancer- basal cell on arm / colon 2011  . Lymphoma ([place])   . Colon cancer ([employer])     2011, s/p surgery, in remission  . Neuropathy due to chemotherapeutic drug ([place]) 06/19/2014  . hodgkins lymphoma   . Heart murmur   . GERD (gastroesophageal reflux disease)   . Anemia     hx blood tx  . Pneumonia   . 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 Benign essential HTN   . Chronic systolic CHF (congestive heart failure), NYHA class 2 ([place]) 01/2015  . NICM (nonischemic cardiomyopathy) ([place]) 01/2015  . PAF (paroxysmal atrial fibrillation) ([place])   . Generalized weakness 05/11/2015    Past Surgical History  Procedure Laterality Date  . Cholecystectomy    . Splenectomy      lymphoma  . Breast biopsy  1996  . Plantar fascia release    . Ventral hernia repair  1998  . Tendon release      Right thumb   . Colectomy  8/11  .  Vaginal hysterectomy    . Bone marrow biopsy  05/27/13  . Cataract extraction, bilateral  2016  . Port-a-cath insertion    . Colonoscopy w/ biopsies    . Dg biopsy lung    . Video bronchoscopy with endobronchial ultrasound N/A 10/17/2014    Procedure: VIDEO BRONCHOSCOPY WITH ENDOBRONCHIAL ULTRASOUND;  Surgeon: Javier Glazier, MD;  Location: [place];  Service: Thoracic;  Laterality: N/A;  . Video bronchoscopy with endobronchial ultrasound N/A 11/01/2014    Procedure: VIDEO BRONCHOSCOPY WITH ENDOBRONCHIAL ULTRASOUND;  Surgeon: Grace Isaac, MD;  Location: [place];  Service: Thoracic;  Laterality: N/A;  . Mediastinoscopy N/A 11/01/2014    Procedure: MEDIASTINOSCOPY;  Surgeon: Grace Isaac, MD;  Location: [place];  Service: Thoracic;  Laterality: N/A;  . Lymph node biopsy N/A 11/01/2014    Procedure: MEDIASTINAL LYMPH NODE BIOPSY;  Surgeon: Grace Isaac, MD;  Location: [place];  Service: Thoracic;  Laterality: N/A;  . Cardiac catheterization N/A 01/25/2015    Procedure: Left Heart Cath and Coronary Angiography;  Surgeon: Peter M Martinique, MD;  Location: [place] [facility];  Service: Cardiovascular;  Laterality: N/A;    There were no vitals filed for this visit.  Subjective Assessment - 08/15/15 1117    Subjective pt reports feeling "so-so" today.    How long can you stand comfortably? 49mn   Patient Stated Goals get stronger, walk farther   Currently in Pain? No/denies       THEREX Nustep L2 x 5 min (unbilled) 6 min walk test: 1,0044fwith 1 short standing rest break; baseline 74038fNMR bil tandem stance on 1/2 foam roll (upside down) with no UE support, 10x10 sec hold each; min A for balance bil staggered stance with front foot on dyna disc and making rainbows with small yellow medicine ball 1x10 each bil star drill on airex with no UE support (x4 stepping stones) x 5 each leg; min A for balance bil SLS on airex with no UE support, 3x10 sec each; min A for balance         PT Education - 08/15/15 1118    Education provided Yes   Education Details exercise technique, weight shifting   Person(s) Educated Patient   Methods Explanation   Comprehension Verbalized understanding             PT Long Term Goals - 08/15/15 1304    PT LONG TERM GOAL #1   Title pt will perform 5x sit to stand in <15s to improve LE strength    Baseline 22.9 s (08/13/15)   Time 4   Period Weeks   Status Partially Met   PT LONG TERM GOAL #2   Title pt will ambulate at 1.27m/121mo improve her community mobility    Baseline 1.21m/s34m/3/17)   Time 4   Period Weeks   Status Achieved   PT LONG TERM GOAL #3   Title pt will improve 6min 69mk by 150ft d40fstrating improved activity tolerance.    Baseline 740ft wi22f seated rest break    Time 4   Period Weeks   Status Achieved   PT LONG TERM GOAL #4   Title pt will increase berg balance score +6 pts in order to reduce fall risk    Baseline 52/56 (08/13/15)   Time 4   Period Weeks   Status Achieved   PT LONG TERM GOAL #5   Title pt will improve DGI score by +3 pts to demonstrated improved dynamic balance and decrease risk for falls.   Baseline 18/24 (08/13/15)   Time 4   Period Weeks   Status New               Plan - 08/15/15 1259    Clinical Impression Statement pt continues to make progress towards goals and demonstrated improved endurance today as evidenced by her improved 6 minute walk test to 1,005 ft. pt continues to demonstrate increased fatigue in RLE with balance and strengthening exercises. Her dynamic and NBOS balance continues to improve but she still reqiures assistance to maintain balance. pt needs continued skilled PT intervention to address remaining deficits to maximize overall function and decrease risk of falls.   Rehab Potential Good   Clinical Impairments Affecting Rehab Potential co-morbidities   PT Frequency 2x / week   PT Duration 4 weeks   PT Treatment/Interventions Neuromuscular  re-education;Balance training;Therapeutic exercise;Therapeutic activities;Functional mobility training;Stair training;Gait training;Manual lymph drainage;Passive range of motion   PT Next Visit Plan HEP   Consulted and Agree with Plan of Care Patient      Patient will benefit from skilled therapeutic intervention in order to improve the following deficits and impairments:  Decreased strength, Difficulty walking, Impaired  flexibility, Pain, Decreased coordination, Decreased endurance, Decreased balance, Impaired sensation, Decreased activity tolerance  Visit Diagnosis: Muscle weakness (generalized)     Problem List Patient Active Problem List   Diagnosis Date Noted  . Cough 07/18/2015  . Port catheter in place 07/11/2015  . Generalized weakness 05/11/2015  . Benign essential HTN   . PAF (paroxysmal atrial fibrillation) ([place])   . Internal hemorrhoids 02/21/2015  . Rectal bleeding 02/06/2015  . 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 Thrombocytopenia ([place]) 01/29/2015  . AKI (acute kidney injury) ([place])   . Elevated troponin   . Atrial fibrillation with rapid ventricular response ([place]) 01/23/2015  . New onset a-fib ([place]) 01/23/2015  . Hypertension 01/23/2015  . CHF (congestive heart failure) ([place]) 01/21/2015  . Chronic systolic CHF (congestive heart failure), NYHA class 2 ([place]) 01/11/2015  . Fever, unspecified 01/10/2015  . Immunocompromised status associated with infection ([place]) 01/08/2015  . 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 Preventive measure 11/14/2014  . Parotid mass 11/14/2014  . Chronic lower back pain 09/28/2014  . Mediastinal lymphadenopathy 09/27/2014  . Neuropathy due to chemotherapeutic drug ([place]) 06/19/2014  . Peripheral neuropathy due to chemotherapy ([place]) 04/03/2014  . Trochanteric bursitis of right hip 04/03/2014  . Anemia in  chronic illness 02/21/2014  . Elevated liver enzymes 02/21/2014  . Urinary tract infection 11/18/2013  . Candidal intertrigo 09/23/2013  . Atrophic vaginitis 09/23/2013  . Drug-induced neutropenia ([place]) 09/01/2013  . History of colon cancer 06/17/2013  . History of hodgkin's lymphoma 05/20/2013  . Dysuria 05/20/2013  . Conjunctivitis, acute 04/27/2013  . Cold sore 04/27/2013  . Left temporal headache 03/11/2013  . Hyperglycemia 03/11/2013  . Acute maxillary sinusitis 02/09/2013  . Other malaise and fatigue 01/04/2013  . Herpes zoster 11/22/2012  . Vaginal pain 11/22/2012  . Cystocele 09/22/2012  . Encounter for Medicare annual wellness exam 09/17/2012  . Left knee pain 09/10/2012  . Thoracic back pain 06/01/2012  . Skin lesion of face 05/17/2012  . Other screening mammogram 07/22/2011  . Routine gynecological examination 07/22/2011  . Colon cancer screening 07/22/2011  . History of anemia 05/06/2011  . 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 Hypothyroid 02/24/2011  . Varicose veins 02/24/2011  . Elevated blood pressure 11/20/2010  . Obesity 11/20/2010  . 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 Other chronic sinusitis 07/03/2009  . THROAT PAIN, CHRONIC 07/03/2009  . GERD 12/20/2008  . Anxiety state, unspecified 08/22/2008  . MENOPAUSAL SYNDROME 07/13/2007  . HYPERCHOLESTEROLEMIA, PURE 07/08/2006  . Allergic rhinitis 07/08/2006  . OVERACTIVE BLADDER 07/08/2006  . 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 Depression 06/25/2006  . PERIPHERAL VASCULAR DISEASE 06/25/2006  . OSTEOARTHRITIS 06/25/2006  . LEG EDEMA 06/25/2006  . SKIN CANCER, HX OF 06/25/2006  . COLONIC POLYPS, HX OF 06/25/2006   Geraldine Solar, SPT This entire session was performed under direct supervision and direction of a licensed therapist/therapist assistant . I have personally read, edited and approve of the note as written. Gorden Harms. Tortorici, PT, DPT [phone]   Tortorici,Ashley 08/15/2015, 2:25  PM  [place] MAIN [facility] SERVICES [address] [place], [place], [address] Phone: [phone]   Fax:  [phone]  Name: Diamond Boyd MRN: [id number] Date of Birth: Mar 10, 1940

## 2015-08-16 ENCOUNTER — Encounter: Payer: Self-pay | Admitting: Family Medicine

## 2015-08-16 ENCOUNTER — Ambulatory Visit (INDEPENDENT_AMBULATORY_CARE_PROVIDER_SITE_OTHER): Payer: Medicare Other | Admitting: Family Medicine

## 2015-08-16 VITALS — BP 104/52 | HR 65 | Temp 98.5°F | Wt 245.5 lb

## 2015-08-16 DIAGNOSIS — R059 Cough, unspecified: Secondary | ICD-10-CM

## 2015-08-16 DIAGNOSIS — I2510 Atherosclerotic heart disease of native coronary artery without angina pectoris: Secondary | ICD-10-CM | POA: Diagnosis not present

## 2015-08-16 DIAGNOSIS — R05 Cough: Secondary | ICD-10-CM

## 2015-08-16 MED ORDER — CLINDAMYCIN HCL 300 MG PO CAPS: 300.0000 mg | ORAL_CAPSULE | Freq: Three times a day (TID) | ORAL | Status: DC

## 2015-08-16 NOTE — Progress Notes (Signed)
Pre visit review using our clinic review tool, if applicable. No additional management support is needed unless otherwise documented below in the visit note.  Prev with CXR and CBC was unremarkable at last OV, last month.  SABA not used daily.  Still coughing since last OV.  Using SABA helps cough, for a few hours.  No sputum.  No fevers.   Still with green nasal discharge.  Was on zmax about 3 weeks ago, minimal change in sx.  Was on doxy prev.  Some HA recently. Still on flonase.  L>R nasal congestion.    Meds, vitals, and allergies reviewed.   ROS: Per HPI unless specifically indicated in ROS section   GEN: nad, alert and oriented HEENT: mucous membranes moist, tm w/o erythema, nasal exam w/o erythema, clear discharge noted,  OP with cobblestoning, L max sinus ttp NECK: supple w/o LA CV: rrr.   PULM: ctab, no inc wob EXT: no edema SKIN: no acute rash

## 2015-08-16 NOTE — Patient Instructions (Signed)
Continue flonase and nasal saline.  Start clindamycin and update [place] if note better.   Take care.  Glad to see you.

## 2015-08-17 NOTE — Assessment & Plan Note (Signed)
Presumed to be from L max sinusitis. D/w pt.  Nontoxic.  Would start clinda and update [place] as needed.  See AVS.  She agrees.  No wheeze on exam.

## 2015-08-20 ENCOUNTER — Ambulatory Visit: Payer: Medicare Other

## 2015-08-21 ENCOUNTER — Ambulatory Visit: Payer: Medicare Other | Admitting: Physical Therapy

## 2015-08-21 DIAGNOSIS — M6281 Muscle weakness (generalized): Secondary | ICD-10-CM

## 2015-08-21 NOTE — Therapy (Signed)
[place] MAIN [facility] SERVICES [address] [place], [place], [address] Phone: [phone]   Fax:  [phone]  Physical Therapy Treatment  Patient Details  Name: Diamond Boyd MRN: [id number] Date of Birth: April 11, 1940 Referring Provider: Alvy Bimler  Encounter Date: 08/21/2015      PT End of Session - 08/21/15 1035    Visit Number 10   Number of Visits 25   Date for PT Re-Evaluation 09/10/15   Authorization Type 2/10   PT Start Time 1030   PT Stop Time 1115   PT Time Calculation (min) 45 min   Equipment Utilized During Treatment Gait belt   Activity Tolerance Patient tolerated treatment well   Behavior During Therapy [facility] for tasks assessed/performed      Past Medical History  Diagnosis Date  . Colon polyps   . Depression   . Hypothyroidism   . Osteoarthritis     hands  . Peripheral vascular disease ([place])   . Degenerative disk disease     Thoracic spine  . 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 Cancer ([place])     skin cancer- basal cell on arm / colon 2011  . Lymphoma ([place])   . Colon cancer ([place])     2011, s/p surgery, in remission  . Neuropathy due to chemotherapeutic drug ([place]) 06/19/2014  . hodgkins lymphoma   . Heart murmur   . GERD (gastroesophageal reflux disease)   . Anemia     hx blood tx  . Pneumonia   . 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 Benign essential HTN   . Chronic systolic CHF (congestive heart failure), NYHA class 2 ([place]) 01/2015  . NICM (nonischemic cardiomyopathy) ([place]) 01/2015  . PAF (paroxysmal atrial fibrillation) ([place])   . Generalized weakness 05/11/2015    Past Surgical History  Procedure Laterality Date  . Cholecystectomy    . Splenectomy      lymphoma  . Breast biopsy  1996  . Plantar fascia release    . Ventral hernia repair  1998  . Tendon release      Right thumb   . Colectomy  8/11  .  Vaginal hysterectomy    . Bone marrow biopsy  05/27/13  . Cataract extraction, bilateral  2016  . Port-a-cath insertion    . Colonoscopy w/ biopsies    . Dg biopsy lung    . Video bronchoscopy with endobronchial ultrasound N/A 10/17/2014    Procedure: VIDEO BRONCHOSCOPY WITH ENDOBRONCHIAL ULTRASOUND;  Surgeon: Javier Glazier, MD;  Location: [place];  Service: Thoracic;  Laterality: N/A;  . Video bronchoscopy with endobronchial ultrasound N/A 11/01/2014    Procedure: VIDEO BRONCHOSCOPY WITH ENDOBRONCHIAL ULTRASOUND;  Surgeon: Grace Isaac, MD;  Location: [place];  Service: Thoracic;  Laterality: N/A;  . Mediastinoscopy N/A 11/01/2014    Procedure: MEDIASTINOSCOPY;  Surgeon: Grace Isaac, MD;  Location: [place];  Service: Thoracic;  Laterality: N/A;  . Lymph node biopsy N/A 11/01/2014    Procedure: MEDIASTINAL LYMPH NODE BIOPSY;  Surgeon: Grace Isaac, MD;  Location: [place];  Service: Thoracic;  Laterality: N/A;  . Cardiac catheterization N/A 01/25/2015    Procedure: Left Heart Cath and Coronary Angiography;  Surgeon: Peter M Martinique, MD;  Location: [place] [facility];  Service: Cardiovascular;  Laterality: N/A;    There were no vitals filed for this visit.  Subjective Assessment - 08/21/15 1032    Subjective pt reports feeling "so so" today and says she started a new antibiotic for the cold she has been dealing with. She states that she feels a little woozy from all of the medications she takes.   How long can you stand comfortably? 45mn   Patient Stated Goals get stronger, walk farther   Currently in Pain? No/denies      THEREX Nustep L2 x 5 min (unbilled) sit <> stand, 2x10 with no UE support; SBA; fatigue R>L fwd/retro monster walks, 287fx 5 laps each; more difficulty with balance during retro  NMR fwd/lateral step ups with 6" step on airex, 1x10 each; fatigue R>L,  bil tandem stance on airex, 3x30 sec with EO, 2x10 sec EC; more difficult R>L bil staggered stance on  small rockerboard, maintaining balance in middle, and bil lifts/chops with 2# weight bar bil NBOS stance on 1/2 foal roll, 3x15 sec hold  Pt required CGA-min A throughout all exercises. Pt required min verbal cues throughout all exercises for proper technique.          PT Education - 08/21/15 1033    Education provided Yes   Education Details exercise, weight shifting   Person(s) Educated Patient   Methods Explanation   Comprehension Verbalized understanding             PT Long Term Goals - 08/15/15 1304    PT LONG TERM GOAL #1   Title pt will perform 5x sit to stand in <15s to improve LE strength    Baseline 22.9 s (08/13/15)   Time 4   Period Weeks   Status Partially Met   PT LONG TERM GOAL #2   Title pt will ambulate at 1.85m55mto improve her community mobility    Baseline 1.68m/25m7/3/17)   Time 4   Period Weeks   Status Achieved   PT LONG TERM GOAL #3   Title pt will improve 6min16mlk by 150ft 96fnstrating improved activity tolerance.    Baseline 740ft w62f1 seated rest break    Time 4   Period Weeks   Status Achieved   PT LONG TERM GOAL #4   Title pt will increase berg balance score +6 pts in order to reduce fall risk    Baseline 52/56 (08/13/15)   Time 4   Period Weeks   Status Achieved   PT LONG TERM GOAL #5   Title pt will improve DGI score by +3 pts to demonstrated improved dynamic balance and decrease risk for falls.   Baseline 18/24 (08/13/15)   Time 4   Period Weeks   Status New               Plan - 08/21/15 1117    Clinical Impression Statement pt continues to make progress in dynamic balance and overall strength, however, her LE endurance is still diminished R>L and she requires seated rest breaks after each exercise. pt tolerated progressed dynamic balance activities today but required CGA - min A throughout and continues to demonstrate increased hip strategies and decreased ankle strategies to maintain balance. pt needs continued skilled  PT intervention to maximize overall function.   Rehab Potential Good   Clinical Impairments Affecting Rehab Potential co-morbidities   PT Frequency 2x / week   PT Duration 4 weeks   PT Treatment/Interventions Neuromuscular re-education;Balance training;Therapeutic exercise;Therapeutic activities;Functional mobility training;Stair training;Gait training;Manual lymph drainage;Passive range of motion   PT Next Visit Plan HEP  Consulted and Agree with Plan of Care Patient      Patient will benefit from skilled therapeutic intervention in order to improve the following deficits and impairments:  Decreased strength, Difficulty walking, Impaired flexibility, Pain, Decreased coordination, Decreased endurance, Decreased balance, Impaired sensation, Decreased activity tolerance  Visit Diagnosis: Muscle weakness (generalized)     Problem List Patient Active Problem List   Diagnosis Date Noted  . Cough 07/18/2015  . Port catheter in place 07/11/2015  . Generalized weakness 05/11/2015  . Benign essential HTN   . PAF (paroxysmal atrial fibrillation) ([place])   . Internal hemorrhoids 02/21/2015  . Rectal bleeding 02/06/2015  . 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 Thrombocytopenia ([place]) 01/29/2015  . AKI (acute kidney injury) ([place])   . Elevated troponin   . Atrial fibrillation with rapid ventricular response ([place]) 01/23/2015  . New onset a-fib ([place]) 01/23/2015  . Hypertension 01/23/2015  . CHF (congestive heart failure) ([place]) 01/21/2015  . Chronic systolic CHF (congestive heart failure), NYHA class 2 ([place]) 01/11/2015  . Fever, unspecified 01/10/2015  . Immunocompromised status associated with infection ([place]) 01/08/2015  . 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) 12/11/2014  . Preventive measure 11/14/2014  . Parotid mass 11/14/2014  . Chronic lower back pain 09/28/2014  .  Mediastinal lymphadenopathy 09/27/2014  . Neuropathy due to chemotherapeutic drug ([place]) 06/19/2014  . Peripheral neuropathy due to chemotherapy ([place]) 04/03/2014  . Trochanteric bursitis of right hip 04/03/2014  . Anemia in chronic illness 02/21/2014  . Elevated liver enzymes 02/21/2014  . Urinary tract infection 11/18/2013  . Candidal intertrigo 09/23/2013  . Atrophic vaginitis 09/23/2013  . Drug-induced neutropenia ([place]) 09/01/2013  . History of colon cancer 06/17/2013  . History of hodgkin's lymphoma 05/20/2013  . Dysuria 05/20/2013  . Conjunctivitis, acute 04/27/2013  . Cold sore 04/27/2013  . Left temporal headache 03/11/2013  . Hyperglycemia 03/11/2013  . Acute maxillary sinusitis 02/09/2013  . Other malaise and fatigue 01/04/2013  . Herpes zoster 11/22/2012  . Vaginal pain 11/22/2012  . Cystocele 09/22/2012  . Encounter for Medicare annual wellness exam 09/17/2012  . Left knee pain 09/10/2012  . Thoracic back pain 06/01/2012  . Skin lesion of face 05/17/2012  . Other screening mammogram 07/22/2011  . Routine gynecological examination 07/22/2011  . Colon cancer screening 07/22/2011  . History of anemia 05/06/2011  . 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 Hypothyroid 02/24/2011  . Varicose veins 02/24/2011  . Elevated blood pressure 11/20/2010  . Obesity 11/20/2010  . 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 Other chronic sinusitis 07/03/2009  . THROAT PAIN, CHRONIC 07/03/2009  . GERD 12/20/2008  . Anxiety state, unspecified 08/22/2008  . MENOPAUSAL SYNDROME 07/13/2007  . HYPERCHOLESTEROLEMIA, PURE 07/08/2006  . Allergic rhinitis 07/08/2006  . OVERACTIVE BLADDER 07/08/2006  . 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 Depression 06/25/2006  . PERIPHERAL VASCULAR DISEASE 06/25/2006  . OSTEOARTHRITIS 06/25/2006  . LEG EDEMA 06/25/2006  . SKIN CANCER, HX OF 06/25/2006  . COLONIC POLYPS, HX OF 06/25/2006   Geraldine Solar, SPT  This entire session was performed  under direct supervision and direction of a licensed therapist/therapist assistant . I have personally read, edited and approve of the note as written.    Geraldine Solar 08/21/2015, 11:22 AM   Neoma Laming, PT, DPT  08/21/2015, 1:34 PM [place] MAIN [facility] SERVICES [address]  [place], [place], [address] Phone: [phone]   Fax:  [phone]  Name: Diamond Boyd MRN: [id number] Date of Birth: 1940-02-14

## 2015-08-23 ENCOUNTER — Ambulatory Visit: Payer: Medicare Other | Admitting: Physical Therapy

## 2015-08-23 DIAGNOSIS — M6281 Muscle weakness (generalized): Secondary | ICD-10-CM

## 2015-08-23 NOTE — Therapy (Signed)
[place] MAIN [facility] SERVICES [address] [place], [place], [address] Phone: [phone]   Fax:  [phone]  Physical Therapy Treatment  Patient Details  Name: Diamond Boyd MRN: [id number] Date of Birth: [birth year] Referring Provider: Alvy Bimler  Encounter Date: 08/23/2015      PT End of Session - 08/23/15 1342    Visit Number 11   Number of Visits 25   Date for PT Re-Evaluation 09/10/15   Authorization Type 3/10   PT Start Time 1300   PT Stop Time 1342   PT Time Calculation (min) 42 min   Equipment Utilized During Treatment Gait belt   Activity Tolerance Patient tolerated treatment well   Behavior During Therapy [facility] for tasks assessed/performed      Past Medical History  Diagnosis Date  . Colon polyps   . Depression   . Hypothyroidism   . Osteoarthritis     hands  . Peripheral vascular disease ([place])   . Degenerative disk disease     Thoracic spine  . 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 Cancer ([place])     skin cancer- basal cell on arm / colon 2011  . Lymphoma ([place])   . Colon cancer ([place])     2011, s/p surgery, in remission  . Neuropathy due to chemotherapeutic drug ([place]) 06/19/2014  . hodgkins lymphoma   . Heart murmur   . GERD (gastroesophageal reflux disease)   . Anemia     hx blood tx  . Pneumonia   . 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 Benign essential HTN   . Chronic systolic CHF (congestive heart failure), NYHA class 2 ([place]) 01/2015  . NICM (nonischemic cardiomyopathy) ([place]) 01/2015  . PAF (paroxysmal atrial fibrillation) ([place])   . Generalized weakness 05/11/2015    Past Surgical History  Procedure Laterality Date  . Cholecystectomy    . Splenectomy      lymphoma  . Breast biopsy  1996  . Plantar fascia release    . Ventral hernia repair  1998  . Tendon release      Right thumb   . Colectomy  8/11  .  Vaginal hysterectomy    . Bone marrow biopsy  05/27/13  . Cataract extraction, bilateral  2016  . Port-a-cath insertion    . Colonoscopy w/ biopsies    . Dg biopsy lung    . Video bronchoscopy with endobronchial ultrasound N/A 10/17/2014    Procedure: VIDEO BRONCHOSCOPY WITH ENDOBRONCHIAL ULTRASOUND;  Surgeon: Javier Glazier, MD;  Location: [place];  Service: Thoracic;  Laterality: N/A;  . Video bronchoscopy with endobronchial ultrasound N/A 11/01/2014    Procedure: VIDEO BRONCHOSCOPY WITH ENDOBRONCHIAL ULTRASOUND;  Surgeon: Grace Isaac, MD;  Location: [place];  Service: Thoracic;  Laterality: N/A;  . Mediastinoscopy N/A 11/01/2014    Procedure: MEDIASTINOSCOPY;  Surgeon: Grace Isaac, MD;  Location: [place];  Service: Thoracic;  Laterality: N/A;  . Lymph node biopsy N/A 11/01/2014    Procedure: MEDIASTINAL LYMPH NODE BIOPSY;  Surgeon: Grace Isaac, MD;  Location: [place];  Service: Thoracic;  Laterality: N/A;  . Cardiac catheterization N/A 01/25/2015    Procedure: Left Heart Cath and Coronary Angiography;  Surgeon: Peter M Martinique, MD;  Location: [place] [facility];  Service: Cardiovascular;  Laterality: N/A;    There were no vitals filed for this visit.  Subjective Assessment - 08/23/15 1341    Subjective pt reports feeling "so-so" again today and feels a little groggy from her new med.   How long can you stand comfortably? 319mn   Patient Stated Goals get stronger, walk farther   Currently in Pain? No/denies      THEREX wall sits with theraball, 2x5 sec hold; CGA for balance, demonstrated fatigue leg press 1x10 with 60#, 2x10 with 75#  NMR bil side stepping in hallway with ball toss, 1872fx 2; min verbal cues to maintain gait, CGA for slight unsteadiness Gait with ball bounce/toss to self x 180 ft; min verbal cues for technique and to maintain fwd progression 18071fetro walking with ball toss; bil staggered stance with front foot on dynadisc with diagonal ball  bounce, 2x10 each; min A throughout with RLE up and required max A for 1 LOB with RLE fwd bil tandem stance on 1/2 foam roll with dual task (serial 3s from 100; words that start with letter B & L) x 10 min; more difficulty and increased fatigue with RLE back; CGA - min A for balance and pt more unsteady with dual task    Pt required min verbal cues throughout exercises for proper technique and weight shifting.       PT Education - 08/23/15 1342    Education provided Yes   Education Details weight shifting   Person(s) Educated Patient   Methods Explanation   Comprehension Verbalized understanding             PT Long Term Goals - 08/15/15 1304    PT LONG TERM GOAL #1   Title pt will perform 5x sit to stand in <15s to improve LE strength    Baseline 22.9 s (08/13/15)   Time 4   Period Weeks   Status Partially Met   PT LONG TERM GOAL #2   Title pt will ambulate at 1.19m/68mo improve her community mobility    Baseline 1.70m/s619m/3/17)   Time 4   Period Weeks   Status Achieved   PT LONG TERM GOAL #3   Title pt will improve 6min 46mk by 150ft d71fstrating improved activity tolerance.    Baseline 740ft wi33f seated rest break    Time 4   Period Weeks   Status Achieved   PT LONG TERM GOAL #4   Title pt will increase berg balance score +6 pts in order to reduce fall risk    Baseline 52/56 (08/13/15)   Time 4   Period Weeks   Status Achieved   PT LONG TERM GOAL #5   Title pt will improve DGI score by +3 pts to demonstrated improved dynamic balance and decrease risk for falls.   Baseline 18/24 (08/13/15)   Time 4   Period Weeks   Status New               Plan - 08/23/15 1342    Clinical Impression Statement pt continues to make progress as she was able to participate in progressed balance and strengthening exercises this session. pt continues to be limited by RLE fatigue and requires frequent rest breaks. She requires cues for proper weight shifting throughout  activities. pt needs continued skilled PT intervention to maximize overall independence and function.   Rehab Potential Good   Clinical Impairments Affecting Rehab Potential co-morbidities   PT Frequency 2x / week   PT Duration 4 weeks   PT Treatment/Interventions Neuromuscular re-education;Balance training;Therapeutic exercise;Therapeutic activities;Functional mobility training;Stair training;Gait  training;Manual lymph drainage;Passive range of motion   PT Next Visit Plan HEP   Consulted and Agree with Plan of Care Patient      Patient will benefit from skilled therapeutic intervention in order to improve the following deficits and impairments:  Decreased strength, Difficulty walking, Impaired flexibility, Pain, Decreased coordination, Decreased endurance, Decreased balance, Impaired sensation, Decreased activity tolerance  Visit Diagnosis: Muscle weakness (generalized)     Problem List Patient Active Problem List   Diagnosis Date Noted  . Cough 07/18/2015  . Port catheter in place 07/11/2015  . Generalized weakness 05/11/2015  . Benign essential HTN   . PAF (paroxysmal atrial fibrillation) ([place])   . Internal hemorrhoids 02/21/2015  . Rectal bleeding 02/06/2015  . 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 Thrombocytopenia ([place]) 01/29/2015  . AKI (acute kidney injury) ([place])   . Elevated troponin   . Atrial fibrillation with rapid ventricular response ([place]) 01/23/2015  . New onset a-fib ([place]) 01/23/2015  . Hypertension 01/23/2015  . CHF (congestive heart failure) ([place]) 01/21/2015  . Chronic systolic CHF (congestive heart failure), NYHA class 2 ([place]) 01/11/2015  . Fever, unspecified 01/10/2015  . Immunocompromised status associated with infection ([place]) 01/08/2015  . 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 Preventive measure 11/14/2014  . Parotid mass  11/14/2014  . Chronic lower back pain 09/28/2014  . Mediastinal lymphadenopathy 09/27/2014  . Neuropathy due to chemotherapeutic drug ([place]) 06/19/2014  . Peripheral neuropathy due to chemotherapy ([place]) 04/03/2014  . Trochanteric bursitis of right hip 04/03/2014  . Anemia in chronic illness 02/21/2014  . Elevated liver enzymes 02/21/2014  . Urinary tract infection 11/18/2013  . Candidal intertrigo 09/23/2013  . Atrophic vaginitis 09/23/2013  . Drug-induced neutropenia ([place]) 09/01/2013  . History of colon cancer 06/17/2013  . History of hodgkin's lymphoma 05/20/2013  . Dysuria 05/20/2013  . Conjunctivitis, acute 04/27/2013  . Cold sore 04/27/2013  . Left temporal headache 03/11/2013  . Hyperglycemia 03/11/2013  . Acute maxillary sinusitis 02/09/2013  . Other malaise and fatigue 01/04/2013  . Herpes zoster 11/22/2012  . Vaginal pain 11/22/2012  . Cystocele 09/22/2012  . Encounter for Medicare annual wellness exam 09/17/2012  . Left knee pain 09/10/2012  . Thoracic back pain 06/01/2012  . Skin lesion of face 05/17/2012  . Other screening mammogram 07/22/2011  . Routine gynecological examination 07/22/2011  . Colon cancer screening 07/22/2011  . History of anemia 05/06/2011  . 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 Hypothyroid 02/24/2011  . Varicose veins 02/24/2011  . Elevated blood pressure 11/20/2010  . Obesity 11/20/2010  . 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 Other chronic sinusitis 07/03/2009  . THROAT PAIN, CHRONIC 07/03/2009  . GERD 12/20/2008  . Anxiety state, unspecified 08/22/2008  . MENOPAUSAL SYNDROME 07/13/2007  . HYPERCHOLESTEROLEMIA, PURE 07/08/2006  . Allergic rhinitis 07/08/2006  . OVERACTIVE BLADDER 07/08/2006  . 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 Depression 06/25/2006  . PERIPHERAL VASCULAR DISEASE 06/25/2006  . OSTEOARTHRITIS 06/25/2006  . LEG EDEMA 06/25/2006  . SKIN CANCER, HX OF 06/25/2006  . COLONIC POLYPS, HX OF 06/25/2006    Geraldine Solar, SPT  Geraldine Solar 08/23/2015, 1:46 PM   This entire session was performed under direct supervision and direction of a licensed therapist/therapist assistant . I have personally read, edited and approve of the note as written.  Neoma Laming, PT, DPT  08/23/2015, 2:02 PM  [place] MAIN [facility] SERVICES [address] [place], [place], [address] Phone: [phone]   Fax:  [phone]  Name: DANEL STUDZINSKI MRN: [id number] Date of Birth: 1940-06-07

## 2015-08-28 ENCOUNTER — Ambulatory Visit: Payer: Medicare Other

## 2015-08-28 ENCOUNTER — Ambulatory Visit: Payer: Medicare Other | Admitting: Physical Therapy

## 2015-08-28 DIAGNOSIS — M6281 Muscle weakness (generalized): Secondary | ICD-10-CM

## 2015-08-28 NOTE — Therapy (Signed)
[place] MAIN [facility] SERVICES [address] [place], [place], [address] Phone: [phone]   Fax:  [phone]  Physical Therapy Treatment  Patient Details  Name: Diamond Boyd MRN: [id number] Date of Birth: 24-Feb-1940 Referring Provider: Alvy Bimler  Encounter Date: 08/28/2015      PT End of Session - 08/28/15 1435    Visit Number 12   Number of Visits 25   Date for PT Re-Evaluation 09/10/15   Authorization Type 4/10   PT Start Time 1430   PT Stop Time 1513   PT Time Calculation (min) 43 min   Equipment Utilized During Treatment Gait belt   Activity Tolerance Patient tolerated treatment well   Behavior During Therapy [facility] for tasks assessed/performed      Past Medical History  Diagnosis Date  . Colon polyps   . Depression   . Hypothyroidism   . Osteoarthritis     hands  . Peripheral vascular disease ([place])   . Degenerative disk disease     Thoracic spine  . 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 Cancer ([place])     skin cancer- basal cell on arm / colon 2011  . Lymphoma ([place])   . Colon cancer ([place])     2011, s/p surgery, in remission  . Neuropathy due to chemotherapeutic drug ([place]) 06/19/2014  . hodgkins lymphoma   . Heart murmur   . GERD (gastroesophageal reflux disease)   . Anemia     hx blood tx  . Pneumonia   . 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 Benign essential HTN   . Chronic systolic CHF (congestive heart failure), NYHA class 2 ([place]) 01/2015  . NICM (nonischemic cardiomyopathy) ([place]) 01/2015  . PAF (paroxysmal atrial fibrillation) ([place])   . Generalized weakness 05/11/2015    Past Surgical History  Procedure Laterality Date  . Cholecystectomy    . Splenectomy      lymphoma  . Breast biopsy  1996  . Plantar fascia release    . Ventral hernia repair  1998  . Tendon release      Right thumb   . Colectomy  8/11  .  Vaginal hysterectomy    . Bone marrow biopsy  05/27/13  . Cataract extraction, bilateral  2016  . Port-a-cath insertion    . Colonoscopy w/ biopsies    . Dg biopsy lung    . Video bronchoscopy with endobronchial ultrasound N/A 10/17/2014    Procedure: VIDEO BRONCHOSCOPY WITH ENDOBRONCHIAL ULTRASOUND;  Surgeon: Javier Glazier, MD;  Location: [place];  Service: Thoracic;  Laterality: N/A;  . Video bronchoscopy with endobronchial ultrasound N/A 11/01/2014    Procedure: VIDEO BRONCHOSCOPY WITH ENDOBRONCHIAL ULTRASOUND;  Surgeon: Grace Isaac, MD;  Location: [place];  Service: Thoracic;  Laterality: N/A;  . Mediastinoscopy N/A 11/01/2014    Procedure: MEDIASTINOSCOPY;  Surgeon: Grace Isaac, MD;  Location: [place];  Service: Thoracic;  Laterality: N/A;  . Lymph node biopsy N/A 11/01/2014    Procedure: MEDIASTINAL LYMPH NODE BIOPSY;  Surgeon: Grace Isaac, MD;  Location: [place];  Service: Thoracic;  Laterality: N/A;  . Cardiac catheterization N/A 01/25/2015    Procedure: Left Heart Cath and Coronary Angiography;  Surgeon: Peter M Martinique, MD;  Location: [place] [facility];  Service: Cardiovascular;  Laterality: N/A;    There were no vitals filed for this visit.  Subjective Assessment - 08/28/15 1434    Subjective pt states she has a headache today. Other than that, she feels "alright."   How long can you stand comfortably? 27mn   Patient Stated Goals get stronger, walk farther   Currently in Pain? No/denies           PT Education - 08/28/15 1435    Education provided Yes   Education Details exercise technique, weight shifting   Person(s) Educated Patient   Methods Explanation   Comprehension Verbalized understanding      Therex: Nustep L4, LE only x 5 min (unbilled) Resisted walking 4 way with 10# x 3 laps each; CGA for balance, min verbal cues for control throughout; required seated rest break during exercise because of RLE fatigue  NMR: bil fwd step ups on 4" step  with airex on top and march of contralateral LE, no UE support, 2x10 each; CGA - min A throughout for balance, increased difficulty R>L; pt frequently grabbing SPT for balance bil staggered stance with front foot on dynadisc and rearfoot on airex with balloon punches at shoulder level and above head, 3x20sec bouts each; CGA- min A for balance; increased difficulty with RLE in front bil SLS on  foam roll with no UE support, 10x10 sec hold each; pt required touch assist to gain balance and frequent UE support to maintain balance; CGA throughout       PT Long Term Goals - 08/15/15 1304    PT LONG TERM GOAL #1   Title pt will perform 5x sit to stand in <15s to improve LE strength    Baseline 22.9 s (08/13/15)   Time 4   Period Weeks   Status Partially Met   PT LONG TERM GOAL #2   Title pt will ambulate at 1.072m to improve her community mobility    Baseline 1.70m44m(08/13/15)   Time 4   Period Weeks   Status Achieved   PT LONG TERM GOAL #3   Title pt will improve 6mi55malk by 150ft56fonstrating improved activity tolerance.    Baseline 740ft 35f 1 seated rest break    Time 4   Period Weeks   Status Achieved   PT LONG TERM GOAL #4   Title pt will increase berg balance score +6 pts in order to reduce fall risk    Baseline 52/56 (08/13/15)   Time 4   Period Weeks   Status Achieved   PT LONG TERM GOAL #5   Title pt will improve DGI score by +3 pts to demonstrated improved dynamic balance and decrease risk for falls.   Baseline 18/24 (08/13/15)   Time 4   Period Weeks   Status New               Plan - 08/28/15 1513    Clinical Impression Statement Pt continues to make progress towards goals. Pt continues to be limited by RLE fatigue and requires frequent rest breaks during session. Pt dynamic balance improving as evidenced by ability to participate in progressed balance exercises. Pt needs continued skilled PT intervention to address remaining deficits to maximize overall function  and decrease risk of falls.   Rehab Potential Good   Clinical Impairments Affecting Rehab Potential co-morbidities   PT Frequency 2x / week   PT Duration 4 weeks   PT Treatment/Interventions Neuromuscular re-education;Balance training;Therapeutic exercise;Therapeutic activities;Functional mobility training;Stair training;Gait training;Manual lymph drainage;Passive range of motion   PT Next Visit Plan HEP   Consulted and Agree with  Plan of Care Patient      Patient will benefit from skilled therapeutic intervention in order to improve the following deficits and impairments:  Decreased strength, Difficulty walking, Impaired flexibility, Pain, Decreased coordination, Decreased endurance, Decreased balance, Impaired sensation, Decreased activity tolerance  Visit Diagnosis: Muscle weakness (generalized)     Problem List Patient Active Problem List   Diagnosis Date Noted  . Cough 07/18/2015  . Port catheter in place 07/11/2015  . Generalized weakness 05/11/2015  . Benign essential HTN   . PAF (paroxysmal atrial fibrillation) ([place])   . Internal hemorrhoids 02/21/2015  . Rectal bleeding 02/06/2015  . 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 Thrombocytopenia ([place]) 01/29/2015  . AKI (acute kidney injury) ([place])   . Elevated troponin   . Atrial fibrillation with rapid ventricular response ([place]) 01/23/2015  . New onset a-fib ([place]) 01/23/2015  . Hypertension 01/23/2015  . CHF (congestive heart failure) ([place]) 01/21/2015  . Chronic systolic CHF (congestive heart failure), NYHA class 2 ([place]) 01/11/2015  . Fever, unspecified 01/10/2015  . Immunocompromised status associated with infection ([place]) 01/08/2015  . 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 Preventive measure 11/14/2014  . Parotid mass 11/14/2014  . Chronic lower back pain 09/28/2014  . Mediastinal  lymphadenopathy 09/27/2014  . Neuropathy due to chemotherapeutic drug ([place]) 06/19/2014  . Peripheral neuropathy due to chemotherapy ([place]) 04/03/2014  . Trochanteric bursitis of right hip 04/03/2014  . Anemia in chronic illness 02/21/2014  . Elevated liver enzymes 02/21/2014  . Urinary tract infection 11/18/2013  . Candidal intertrigo 09/23/2013  . Atrophic vaginitis 09/23/2013  . Drug-induced neutropenia ([place]) 09/01/2013  . History of colon cancer 06/17/2013  . History of hodgkin's lymphoma 05/20/2013  . Dysuria 05/20/2013  . Conjunctivitis, acute 04/27/2013  . Cold sore 04/27/2013  . Left temporal headache 03/11/2013  . Hyperglycemia 03/11/2013  . Acute maxillary sinusitis 02/09/2013  . Other malaise and fatigue 01/04/2013  . Herpes zoster 11/22/2012  . Vaginal pain 11/22/2012  . Cystocele 09/22/2012  . Encounter for Medicare annual wellness exam 09/17/2012  . Left knee pain 09/10/2012  . Thoracic back pain 06/01/2012  . Skin lesion of face 05/17/2012  . Other screening mammogram 07/22/2011  . Routine gynecological examination 07/22/2011  . Colon cancer screening 07/22/2011  . History of anemia 05/06/2011  . 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 Hypothyroid 02/24/2011  . Varicose veins 02/24/2011  . Elevated blood pressure 11/20/2010  . Obesity 11/20/2010  . 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 Other chronic sinusitis 07/03/2009  . THROAT PAIN, CHRONIC 07/03/2009  . GERD 12/20/2008  . Anxiety state, unspecified 08/22/2008  . MENOPAUSAL SYNDROME 07/13/2007  . HYPERCHOLESTEROLEMIA, PURE 07/08/2006  . Allergic rhinitis 07/08/2006  . OVERACTIVE BLADDER 07/08/2006  . 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 Depression 06/25/2006  . PERIPHERAL VASCULAR DISEASE 06/25/2006  . OSTEOARTHRITIS 06/25/2006  . LEG EDEMA 06/25/2006  . SKIN CANCER, HX OF 06/25/2006  . COLONIC POLYPS, HX OF 06/25/2006   Geraldine Solar, SPT   Geraldine Solar 08/28/2015, 5:31 PM   This  entire session was performed under direct supervision and direction of a licensed therapist/therapist assistant . I have personally read, edited and approve of the note as written.   Mindy Shiela Mayer, PT, DPT  08/28/2015, 5:40 PM [place] MAIN [facility] SERVICES [place],  [place], [address] Phone: [phone]   Fax:  [phone]  Name: Diamond Boyd MRN: [id number] Date of Birth: 1940/11/25

## 2015-08-30 ENCOUNTER — Ambulatory Visit: Payer: Medicare Other | Admitting: Physical Therapy

## 2015-08-30 DIAGNOSIS — M6281 Muscle weakness (generalized): Secondary | ICD-10-CM | POA: Diagnosis not present

## 2015-08-30 NOTE — Therapy (Signed)
[place] MAIN [facility] SERVICES [address] [place], [place], [address] Phone: [phone]   Fax:  [phone]  Physical Therapy Treatment  Patient Details  Name: Diamond Boyd MRN: [id number] Date of Birth: 09/01/1940 Referring Provider: Alvy Bimler  Encounter Date: 08/30/2015      PT End of Session - 08/30/15 1522    Visit Number 13   Number of Visits 25   Date for PT Re-Evaluation 09/10/15   Authorization Type 5/10   PT Start Time 1518   PT Stop Time 1600   PT Time Calculation (min) 42 min   Equipment Utilized During Treatment Gait belt   Activity Tolerance Patient tolerated treatment well   Behavior During Therapy [facility] for tasks assessed/performed      Past Medical History  Diagnosis Date  . Colon polyps   . Depression   . Hypothyroidism   . Osteoarthritis     hands  . Peripheral vascular disease ([place])   . Degenerative disk disease     Thoracic spine  . 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 Cancer ([place])     skin cancer- basal cell on arm / colon 2011  . Lymphoma ([place])   . Colon cancer ([place])     2011, s/p surgery, in remission  . Neuropathy due to chemotherapeutic drug ([place]) 06/19/2014  . hodgkins lymphoma   . Heart murmur   . GERD (gastroesophageal reflux disease)   . Anemia     hx blood tx  . Pneumonia   . 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 Benign essential HTN   . Chronic systolic CHF (congestive heart failure), NYHA class 2 ([place]) 01/2015  . NICM (nonischemic cardiomyopathy) ([place]) 01/2015  . PAF (paroxysmal atrial fibrillation) ([place])   . Generalized weakness 05/11/2015    Past Surgical History  Procedure Laterality Date  . Cholecystectomy    . Splenectomy      lymphoma  . Breast biopsy  1996  . Plantar fascia release    . Ventral hernia repair  1998  . Tendon release      Right thumb   . Colectomy  8/11  .  Vaginal hysterectomy    . Bone marrow biopsy  05/27/13  . Cataract extraction, bilateral  2016  . Port-a-cath insertion    . Colonoscopy w/ biopsies    . Dg biopsy lung    . Video bronchoscopy with endobronchial ultrasound N/A 10/17/2014    Procedure: VIDEO BRONCHOSCOPY WITH ENDOBRONCHIAL ULTRASOUND;  Surgeon: Javier Glazier, MD;  Location: [place];  Service: Thoracic;  Laterality: N/A;  . Video bronchoscopy with endobronchial ultrasound N/A 11/01/2014    Procedure: VIDEO BRONCHOSCOPY WITH ENDOBRONCHIAL ULTRASOUND;  Surgeon: Grace Isaac, MD;  Location: [place];  Service: Thoracic;  Laterality: N/A;  . Mediastinoscopy N/A 11/01/2014    Procedure: MEDIASTINOSCOPY;  Surgeon: Grace Isaac, MD;  Location: [place];  Service: Thoracic;  Laterality: N/A;  . Lymph node biopsy N/A 11/01/2014    Procedure: MEDIASTINAL LYMPH NODE BIOPSY;  Surgeon: Grace Isaac, MD;  Location: [place];  Service: Thoracic;  Laterality: N/A;  . Cardiac catheterization N/A 01/25/2015    Procedure: Left Heart Cath and Coronary Angiography;  Surgeon: Peter M Martinique, MD;  Location: [place] CV LAB;  Service: Cardiovascular;  Laterality: N/A;    There were no vitals filed for this visit.  Subjective Assessment - 08/30/15 1521    Subjective pt reports continuing to feel "crummy" today.   How long can you stand comfortably? 20mn   Patient Stated Goals get stronger, walk farther   Currently in Pain? No/denies      NMR: Nustep L3 x 3 min (unbilled) Standing on airex fwd/lateral step ups on 6" step with airex on top with no UE support, 1x10 each direction; min A for balance, increased difficulty with RLE Marching on airex pad, 3x10 each leg; CGA for balance; increased difficulty lifting LLE due to RLE weakness Bil side stepping on long airex pad in // bars with no UE support x 10 laps; CGA, increased hip flexion to maintain balance Fwd/retro tandem walking on long airex pad in // bars with no UE support x 10  laps; CGA, increased hip flexion to maintain balance Bil narrow staggered stance on airex with fast low rows with RTB, 5x20 each; attempted SLS with fast low row but pt too unsteady; SBA during exercise        PT Education - 08/30/15 1522    Education provided Yes   Education Details exercise techinque, weight shifting   Person(s) Educated Patient   Methods Explanation   Comprehension Verbalized understanding             PT Long Term Goals - 08/15/15 1304    PT LONG TERM GOAL #1   Title pt will perform 5x sit to stand in <15s to improve LE strength    Baseline 22.9 s (08/13/15)   Time 4   Period Weeks   Status Partially Met   PT LONG TERM GOAL #2   Title pt will ambulate at 1.026m to improve her community mobility    Baseline 1.18m63m(08/13/15)   Time 4   Period Weeks   Status Achieved   PT LONG TERM GOAL #3   Title pt will improve 6mi64malk by 150ft71fonstrating improved activity tolerance.    Baseline 740ft 12f 1 seated rest break    Time 4   Period Weeks   Status Achieved   PT LONG TERM GOAL #4   Title pt will increase berg balance score +6 pts in order to reduce fall risk    Baseline 52/56 (08/13/15)   Time 4   Period Weeks   Status Achieved   PT LONG TERM GOAL #5   Title pt will improve DGI score by +3 pts to demonstrated improved dynamic balance and decrease risk for falls.   Baseline 18/24 (08/13/15)   Time 4   Period Weeks   Status New               Plan - 08/30/15 1603    Clinical Impression Statement Pt continues to make progress towards her goals. Her dynamic balance is improving, but she continues to have difficulty with RLE endurance and strength. Pt demonstrated increased hip flexion this session to maintain her balance throughout all balance activities.   Rehab Potential Good   Clinical Impairments Affecting Rehab Potential co-morbidities   PT Frequency 2x / week   PT Duration 4 weeks   PT Treatment/Interventions Neuromuscular  re-education;Balance training;Therapeutic exercise;Therapeutic activities;Functional mobility training;Stair training;Gait training;Manual lymph drainage;Passive range of motion   PT Next Visit Plan HEP   Consulted and Agree with Plan of Care Patient      Patient will benefit from skilled therapeutic intervention in order to improve the following deficits and impairments:  Decreased strength, Difficulty walking, Impaired flexibility, Pain,  Decreased coordination, Decreased endurance, Decreased balance, Impaired sensation, Decreased activity tolerance  Visit Diagnosis: Muscle weakness (generalized)     Problem List Patient Active Problem List   Diagnosis Date Noted  . Cough 07/18/2015  . Port catheter in place 07/11/2015  . Generalized weakness 05/11/2015  . Benign essential HTN   . PAF (paroxysmal atrial fibrillation) ([place])   . Internal hemorrhoids 02/21/2015  . Rectal bleeding 02/06/2015  . 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 Thrombocytopenia ([place]) 01/29/2015  . AKI (acute kidney injury) ([place])   . Elevated troponin   . Atrial fibrillation with rapid ventricular response ([place]) 01/23/2015  . New onset a-fib ([place]) 01/23/2015  . Hypertension 01/23/2015  . CHF (congestive heart failure) ([place]) 01/21/2015  . Chronic systolic CHF (congestive heart failure), NYHA class 2 ([place]) 01/11/2015  . Fever, unspecified 01/10/2015  . Immunocompromised status associated with infection ([place]) 01/08/2015  . 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 Preventive measure 11/14/2014  . Parotid mass 11/14/2014  . Chronic lower back pain 09/28/2014  . Mediastinal lymphadenopathy 09/27/2014  . Neuropathy due to chemotherapeutic drug ([place]) 06/19/2014  . Peripheral neuropathy due to chemotherapy ([place]) 04/03/2014  . Trochanteric bursitis of right hip 04/03/2014  . Anemia in  chronic illness 02/21/2014  . Elevated liver enzymes 02/21/2014  . Urinary tract infection 11/18/2013  . Candidal intertrigo 09/23/2013  . Atrophic vaginitis 09/23/2013  . Drug-induced neutropenia ([place]) 09/01/2013  . History of colon cancer 06/17/2013  . History of hodgkin's lymphoma 05/20/2013  . Dysuria 05/20/2013  . Conjunctivitis, acute 04/27/2013  . Cold sore 04/27/2013  . Left temporal headache 03/11/2013  . Hyperglycemia 03/11/2013  . Acute maxillary sinusitis 02/09/2013  . Other malaise and fatigue 01/04/2013  . Herpes zoster 11/22/2012  . Vaginal pain 11/22/2012  . Cystocele 09/22/2012  . Encounter for Medicare annual wellness exam 09/17/2012  . Left knee pain 09/10/2012  . Thoracic back pain 06/01/2012  . Skin lesion of face 05/17/2012  . Other screening mammogram 07/22/2011  . Routine gynecological examination 07/22/2011  . Colon cancer screening 07/22/2011  . History of anemia 05/06/2011  . 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 Hypothyroid 02/24/2011  . Varicose veins 02/24/2011  . Elevated blood pressure 11/20/2010  . Obesity 11/20/2010  . 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 Other chronic sinusitis 07/03/2009  . THROAT PAIN, CHRONIC 07/03/2009  . GERD 12/20/2008  . Anxiety state, unspecified 08/22/2008  . MENOPAUSAL SYNDROME 07/13/2007  . HYPERCHOLESTEROLEMIA, PURE 07/08/2006  . Allergic rhinitis 07/08/2006  . OVERACTIVE BLADDER 07/08/2006  . 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 Depression 06/25/2006  . PERIPHERAL VASCULAR DISEASE 06/25/2006  . OSTEOARTHRITIS 06/25/2006  . LEG EDEMA 06/25/2006  . SKIN CANCER, HX OF 06/25/2006  . COLONIC POLYPS, HX OF 06/25/2006   Geraldine Solar, SPT  Geraldine Solar 08/30/2015, 4:15 PM   This entire session was performed under direct supervision and direction of a licensed therapist/therapist assistant . I have personally read, edited and approve of the note as written.  Mindy Shiela Mayer, PT, DPT   08/30/2015, 4:38 PM [place] MAIN [facility] SERVICES [address] [place], [place], [address] Phone: [phone]   Fax:  [phone]  Name: Diamond Boyd MRN: [id number] Date of Birth: Jun 21, 1940

## 2015-09-04 ENCOUNTER — Encounter: Payer: Self-pay | Admitting: Family Medicine

## 2015-09-04 ENCOUNTER — Ambulatory Visit (INDEPENDENT_AMBULATORY_CARE_PROVIDER_SITE_OTHER): Payer: Medicare Other | Admitting: Family Medicine

## 2015-09-04 VITALS — BP 122/64 | HR 69 | Temp 98.2°F | Ht 65.0 in | Wt 245.5 lb

## 2015-09-04 DIAGNOSIS — J328 Other chronic sinusitis: Secondary | ICD-10-CM | POA: Diagnosis not present

## 2015-09-04 DIAGNOSIS — I2510 Atherosclerotic heart disease of native coronary artery without angina pectoris: Secondary | ICD-10-CM

## 2015-09-04 MED ORDER — FUROSEMIDE 20 MG PO TABS: 20.0000 mg | ORAL_TABLET | Freq: Every day | ORAL | 3 refills | Status: DC

## 2015-09-04 NOTE — Assessment & Plan Note (Signed)
Ongoing since spring - no imp after 3 rounds of abx Focal sinus pain on L (maxillary) with obst nare  CT of sinuses ordered Continue allergy protocol- start immunotherapy when able  Alert if fever or new symptoms  Plan from there

## 2015-09-04 NOTE — Addendum Note (Signed)
Addended by: Mariane Masters on: 09/04/2015 08:59 AM   Modules accepted: Orders

## 2015-09-04 NOTE — Progress Notes (Signed)
Subjective:    Patient ID: Diamond Boyd, female    DOB: 27-Oct-1940, 75 y.o.   MRN: [id number]  HPI Here for ongoing upper respiratory issues   Saw Dr Damita Dunnings 7/6 Had been on zmax Then put on clinda Using flonase and ns spray  Saw me in June Ref to allergist  Was seen-getting started on allergy shots- has not started due to illness  Px proair respiclick  Still having symptoms A lot of green drainage from both nostrils Also a cough  Not coughing up anything - hears it rattling  Face is tight L side   Not improved at all with any of the antibiotics and nasal saline   Patient Active Problem List   Diagnosis Date Noted  . Cough 07/18/2015  . Port catheter in place 07/11/2015  . Generalized weakness 05/11/2015  . Benign essential HTN   . PAF (paroxysmal atrial fibrillation) ([place])   . Internal hemorrhoids 02/21/2015  . Rectal bleeding 02/06/2015  . 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 Thrombocytopenia ([place]) 01/29/2015  . AKI (acute kidney injury) ([place])   . Elevated troponin   . Atrial fibrillation with rapid ventricular response ([place]) 01/23/2015  . New onset a-fib ([place]) 01/23/2015  . Hypertension 01/23/2015  . CHF (congestive heart failure) ([place]) 01/21/2015  . Chronic systolic CHF (congestive heart failure), NYHA class 2 ([place]) 01/11/2015  . Fever, unspecified 01/10/2015  . Immunocompromised status associated with infection ([place]) 01/08/2015  . 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 Preventive measure 11/14/2014  . Parotid mass 11/14/2014  . Chronic lower back pain 09/28/2014  . Mediastinal lymphadenopathy 09/27/2014  . Neuropathy due to chemotherapeutic drug ([place]) 06/19/2014  . Peripheral neuropathy due to chemotherapy ([place]) 04/03/2014  . Trochanteric bursitis of right hip 04/03/2014  . Anemia in chronic illness 02/21/2014  .  Elevated liver enzymes 02/21/2014  . Urinary tract infection 11/18/2013  . Candidal intertrigo 09/23/2013  . Atrophic vaginitis 09/23/2013  . Drug-induced neutropenia ([place]) 09/01/2013  . History of colon cancer 06/17/2013  . History of hodgkin's lymphoma 05/20/2013  . Dysuria 05/20/2013  . Conjunctivitis, acute 04/27/2013  . Cold sore 04/27/2013  . Left temporal headache 03/11/2013  . Hyperglycemia 03/11/2013  . Acute maxillary sinusitis 02/09/2013  . Other malaise and fatigue 01/04/2013  . Herpes zoster 11/22/2012  . Vaginal pain 11/22/2012  . Cystocele 09/22/2012  . Encounter for Medicare annual wellness exam 09/17/2012  . Left knee pain 09/10/2012  . Thoracic back pain 06/01/2012  . Skin lesion of face 05/17/2012  . Other screening mammogram 07/22/2011  . Routine gynecological examination 07/22/2011  . Colon cancer screening 07/22/2011  . History of anemia 05/06/2011  . 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 Hypothyroid 02/24/2011  . Varicose veins 02/24/2011  . Elevated blood pressure 11/20/2010  . Obesity 11/20/2010  . 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 Other chronic sinusitis 07/03/2009  . THROAT PAIN, CHRONIC 07/03/2009  . GERD 12/20/2008  . Anxiety state, unspecified 08/22/2008  . MENOPAUSAL SYNDROME 07/13/2007  . HYPERCHOLESTEROLEMIA, PURE 07/08/2006  . Allergic rhinitis 07/08/2006  . OVERACTIVE BLADDER 07/08/2006  . 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 Depression 06/25/2006  . PERIPHERAL VASCULAR DISEASE 06/25/2006  . OSTEOARTHRITIS 06/25/2006  . LEG EDEMA 06/25/2006  . SKIN CANCER, HX OF 06/25/2006  . COLONIC POLYPS, HX OF 06/25/2006   Past Medical History:  Diagnosis  Date  . Anemia    hx blood tx  . Benign essential HTN   . Cancer ([place])    skin cancer- basal cell on arm / colon 2011  . Chronic systolic CHF (congestive heart failure), NYHA class 2 ([place]) 01/2015  . Colon cancer ([place])    2011, s/p surgery, in remission  . Colon polyps     . Degenerative disk disease    Thoracic spine  . Depression   . 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 Generalized weakness 05/11/2015  . GERD (gastroesophageal reflux disease)   . Heart murmur   . hodgkins lymphoma   . Hypothyroidism   . Lymphoma ([place])   . Neuropathy due to chemotherapeutic drug ([place]) 06/19/2014  . NICM (nonischemic cardiomyopathy) ([place]) 01/2015  . Osteoarthritis    hands  . 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 PAF (paroxysmal atrial fibrillation) ([place])   . Peripheral vascular disease ([place])   . Pneumonia    Past Surgical History:  Procedure Laterality Date  . BONE MARROW BIOPSY  05/27/13  . BREAST BIOPSY  1996  . CARDIAC CATHETERIZATION N/A 01/25/2015   Procedure: Left Heart Cath and Coronary Angiography;  Surgeon: Peter M Martinique, MD;  Location: [place] CV LAB;  Service: Cardiovascular;  Laterality: N/A;  . CATARACT EXTRACTION, BILATERAL  2016  . CHOLECYSTECTOMY    . COLECTOMY  8/11  . COLONOSCOPY W/ BIOPSIES    . DG BIOPSY LUNG    . LYMPH NODE BIOPSY N/A 11/01/2014   Procedure: MEDIASTINAL LYMPH NODE BIOPSY;  Surgeon: Grace Isaac, MD;  Location: [place];  Service: Thoracic;  Laterality: N/A;  . MEDIASTINOSCOPY N/A 11/01/2014   Procedure: MEDIASTINOSCOPY;  Surgeon: Grace Isaac, MD;  Location: [place];  Service: Thoracic;  Laterality: N/A;  . PLANTAR FASCIA RELEASE    . port-a-cath insertion    . SPLENECTOMY     lymphoma  . TENDON RELEASE     Right thumb   . VAGINAL HYSTERECTOMY    . VENTRAL HERNIA REPAIR  1998  . VIDEO BRONCHOSCOPY WITH ENDOBRONCHIAL ULTRASOUND N/A 10/17/2014   Procedure: VIDEO BRONCHOSCOPY WITH ENDOBRONCHIAL ULTRASOUND;  Surgeon: Javier Glazier, MD;  Location: [place];  Service: Thoracic;  Laterality: N/A;  . VIDEO BRONCHOSCOPY WITH ENDOBRONCHIAL ULTRASOUND N/A 11/01/2014   Procedure: VIDEO BRONCHOSCOPY WITH ENDOBRONCHIAL ULTRASOUND;   Surgeon: Grace Isaac, MD;  Location: [place];  Service: Thoracic;  Laterality: N/A;   Social History  Substance Use Topics  . Smoking status: Never Smoker  . Smokeless tobacco: Never Used     Comment: Second-hand exposure through father.  . Alcohol use No   Family History  Problem Relation Age of Onset  . Coronary artery disease Mother   . Diabetes Mother   . Diabetes Brother   . Fibromyalgia Daughter     chronic pain   . COPD Daughter   . Colon cancer Neg Hx   . Colon polyps Neg Hx   . Stomach cancer Neg Hx   . Rectal cancer Neg Hx   . Ulcerative colitis Neg Hx   . Crohn's disease Neg Hx   . Asthma Daughter   . Rheum arthritis Brother   . Clotting disorder Brother   . Alcohol abuse Sister    Allergies  Allergen Reactions  . Achromycin [Tetracycline Hcl] Other (See Comments)    Pt does not remember reaction  .  Allopurinol Other (See Comments)    REACTION: Unsure of reaction happene years ago  . Astelin [Azelastine Hcl] Other (See Comments)    Reaction unknown  . Cephalexin Other (See Comments)    REACTION: unsure of reaction happened yrs ago.  . Codeine Other (See Comments)    REACTION: abd. pain  . Lisinopril Cough  . Meloxicam Other (See Comments)    REACTION: GI symptoms  . Minocycline Other (See Comments)    Abdominal pain  . Nabumetone Other (See Comments)    REACTION: reaction not known  . Nyquil [Pseudoeph-Doxylamine-Dm-Apap] Hives  . Penicillins Other (See Comments)    Reaction unknown Has patient had a PCN reaction causing immediate rash, facial/tongue/throat swelling, SOB or lightheadedness with hypotension: unsure reaction unknown Has patient had a PCN reaction causing severe rash involving mucus membranes or skin necrosis: unsure reaction unknown Has patient had a PCN reaction that required hospitalization no Has patient had a PCN reaction occurring within the last 10 years: no If all of the above answers are "NO", then may proceed with  Cephalosporin use.  . Sulfa Antibiotics Other (See Comments)    Gi side eff   . Zolpidem Tartrate Other (See Comments)    REACTION: feels too drugged  . Buspar [Buspirone Hcl] Other (See Comments)    Dizziness, and not as effective for anxiety  . Ciprofloxacin Rash   Current Outpatient Prescriptions on File Prior to Visit  Medication Sig Dispense Refill  . acetaminophen (TYLENOL) 650 MG CR tablet Take 650 mg by mouth every 8 (eight) hours as needed (For back pain.).    Marland Kitchen acyclovir (ZOVIRAX) 400 MG tablet Take 1 tablet (400 mg total) by mouth daily. 90 tablet 3  . Albuterol Sulfate (PROAIR RESPICLICK) 263 (90 Base) MCG/ACT AEPB Inhale 1-2 puffs into the lungs every 6 (six) hours as needed.    Marland Kitchen aspirin 81 MG tablet Take 81 mg by mouth daily.    . Calcium Carbonate-Vitamin D (CALCIUM 600+D) 600-400 MG-UNIT per tablet Take 1 tablet by mouth every evening.     . celecoxib (CELEBREX) 200 MG capsule Take 1 capsule (200 mg total) by mouth daily as needed for moderate pain. 90 capsule 3  . Cholecalciferol (VITAMIN D-3) 1000 UNITS CAPS Take 1,000 Units by mouth daily.    Marland Kitchen docusate sodium (COLACE) 100 MG capsule Take 100 mg by mouth at bedtime.    Marland Kitchen estradiol (ESTRACE) 1 MG tablet Take 1 tablet (1 mg total) by mouth daily. 90 tablet 3  . fexofenadine (ALLEGRA) 180 MG tablet Take 1 tablet (180 mg total) by mouth daily. Reported on 04/23/2015 90 tablet 3  . FLUoxetine (PROZAC) 20 MG capsule Take 1 capsule (20 mg total) by mouth daily. 90 capsule 3  . fluticasone (FLONASE) 50 MCG/ACT nasal spray Place 2 sprays into both nostrils daily. 48 g 3  . gabapentin (NEURONTIN) 300 MG capsule Take 1 capsule (300 mg total) by mouth 2 (two) times daily. 180 capsule 3  . levothyroxine (SYNTHROID, LEVOTHROID) 112 MCG tablet Take 1 tablet (112 mcg total) by mouth daily before breakfast. 90 tablet 3  . lidocaine-prilocaine (EMLA) cream Apply 1 application topically as needed (For port-a-cath.). Reported on 02/22/2015      . losartan (COZAAR) 25 MG tablet Take 1 tablet (25 mg total) by mouth daily. 30 tablet 11  . metoprolol (LOPRESSOR) 50 MG tablet TAKE 1 TABLET (50 MG TOTAL) BY MOUTH 2 (TWO) TIMES DAILY. 180 tablet 3  . Omega-3 350 MG CAPS  Take 1 capsule by mouth daily. Reported on 02/22/2015    . omeprazole (PRILOSEC) 40 MG capsule Take 1 capsule (40 mg total) by mouth daily. 90 capsule 3  . ondansetron (ZOFRAN) 8 MG tablet Take 8 mg by mouth every 6 (six) hours as needed for nausea or vomiting. Reported on 03/05/2015    . polyethylene glycol (MIRALAX / GLYCOLAX) packet Take 17 g by mouth at bedtime.    . prochlorperazine (COMPAZINE) 10 MG tablet Take 10 mg by mouth every 6 (six) hours as needed for nausea or vomiting. Reported on 03/05/2015    . psyllium (METAMUCIL) 58.6 % packet Take 1-3 packets by mouth at bedtime.     . simvastatin (ZOCOR) 40 MG tablet Take 1 tablet by mouth at  bedtime 90 tablet 1  . vitamin E (VITAMIN E) 400 UNIT capsule Take 400 Units by mouth daily. Reported on 03/05/2015     No current facility-administered medications on file prior to visit.      Review of Systems  Constitutional: Positive for appetite change. Negative for fatigue and fever.  HENT: Positive for congestion, ear pain, postnasal drip, rhinorrhea, sinus pressure and sore throat. Negative for nosebleeds.   Eyes: Negative for pain, redness and itching.  Respiratory: Positive for cough. Negative for shortness of breath and wheezing.   Cardiovascular: Negative for chest pain.  Gastrointestinal: Negative for abdominal pain, diarrhea, nausea and vomiting.  Endocrine: Negative for polyuria.  Genitourinary: Negative for dysuria, frequency and urgency.  Musculoskeletal: Negative for arthralgias and myalgias.  Allergic/Immunologic: Negative for immunocompromised state.  Neurological: Positive for headaches. Negative for dizziness, tremors, syncope, weakness and numbness.  Hematological: Negative for adenopathy. Does not  bruise/bleed easily.  Psychiatric/Behavioral: Negative for dysphoric mood. The patient is not nervous/anxious.        Objective:   Physical Exam  Constitutional: She appears well-developed and well-nourished. No distress.  obese and well appearing   HENT:  Head: Normocephalic and atraumatic.  Right Ear: External ear normal.  Left Ear: External ear normal.  Mouth/Throat: Oropharynx is clear and moist. No oropharyngeal exudate.  Nares are injected and congested  Bilateral maxillary sinus tenderness -worse on the L Occluded L nare with purulent material  Post nasal drip   Eyes: Conjunctivae and EOM are normal. Pupils are equal, round, and reactive to light. Right eye exhibits no discharge. Left eye exhibits no discharge.  Neck: Normal range of motion. Neck supple.  Cardiovascular: Normal rate and regular rhythm.   Pulmonary/Chest: Effort normal and breath sounds normal. No respiratory distress. She has no wheezes. She has no rales.  Lymphadenopathy:    She has no cervical adenopathy.  Neurological: She is alert. No cranial nerve deficit.  Skin: Skin is warm and dry. No rash noted.  Psychiatric: She has a normal mood and affect.          Assessment & Plan:   Problem List Items Addressed This Visit      Respiratory   Other chronic sinusitis - Primary    Ongoing since spring - no imp after 3 rounds of abx Focal sinus pain on L (maxillary) with obst nare  CT of sinuses ordered Continue allergy protocol- start immunotherapy when able  Alert if fever or new symptoms  Plan from there      Relevant Orders   CT MAXILLOFACIAL LIMITED WO CONTRAST    Other Visit Diagnoses   None.

## 2015-09-04 NOTE — Progress Notes (Signed)
Pre visit review using our clinic review tool, if applicable. No additional management support is needed unless otherwise documented below in the visit note. 

## 2015-09-04 NOTE — Patient Instructions (Signed)
Stop at check out for referral for CT scan Continue flonase and nasal saline  Continue inhaler as needed  Alert me if any fever other new symptoms  We will make a plan when we get a result

## 2015-09-05 ENCOUNTER — Ambulatory Visit: Payer: Medicare Other

## 2015-09-05 DIAGNOSIS — M6281 Muscle weakness (generalized): Secondary | ICD-10-CM | POA: Diagnosis not present

## 2015-09-05 NOTE — Therapy (Signed)
[place] MAIN [facility] SERVICES [address] [place], [place], [address] Phone: [phone]   Fax:  [phone]  Physical Therapy Treatment  Patient Details  Name: Diamond Boyd MRN: [id number] Date of Birth: [birth year] Referring Provider: Alvy Bimler  Encounter Date: 09/05/2015      PT End of Session - 09/05/15 1447    Visit Number 14   Number of Visits 25   Date for PT Re-Evaluation 09/10/15   Authorization Type 6/10   PT Start Time 1434   PT Stop Time 1515   PT Time Calculation (min) 41 min   Equipment Utilized During Treatment Gait belt   Activity Tolerance Patient tolerated treatment well   Behavior During Therapy [facility] for tasks assessed/performed      Past Medical History:  Diagnosis Date  . Anemia    hx blood tx  . Benign essential HTN   . Cancer ([place])    skin cancer- basal cell on arm / colon 2011  . Chronic systolic CHF (congestive heart failure), NYHA class 2 ([place]) 01/2015  . Colon cancer ([place])    2011, s/p surgery, in remission  . Colon polyps   . Degenerative disk disease    Thoracic spine  . Depression   . 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 Generalized weakness 05/11/2015  . GERD (gastroesophageal reflux disease)   . Heart murmur   . hodgkins lymphoma   . Hypothyroidism   . Lymphoma ([place])   . Neuropathy due to chemotherapeutic drug ([place]) 06/19/2014  . NICM (nonischemic cardiomyopathy) ([place]) 01/2015  . Osteoarthritis    hands  . 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 PAF (paroxysmal atrial fibrillation) ([place])   . Peripheral vascular disease ([place])   . Pneumonia     Past Surgical History:  Procedure Laterality Date  . BONE MARROW BIOPSY  05/27/13  . BREAST BIOPSY  1996  . CARDIAC CATHETERIZATION N/A 01/25/2015   Procedure: Left Heart Cath and Coronary Angiography;  Surgeon: Peter M Martinique, MD;  Location: [place]  CV LAB;  Service: Cardiovascular;  Laterality: N/A;  . CATARACT EXTRACTION, BILATERAL  2016  . CHOLECYSTECTOMY    . COLECTOMY  8/11  . COLONOSCOPY W/ BIOPSIES    . DG BIOPSY LUNG    . LYMPH NODE BIOPSY N/A 11/01/2014   Procedure: MEDIASTINAL LYMPH NODE BIOPSY;  Surgeon: Grace Isaac, MD;  Location: [place];  Service: Thoracic;  Laterality: N/A;  . MEDIASTINOSCOPY N/A 11/01/2014   Procedure: MEDIASTINOSCOPY;  Surgeon: Grace Isaac, MD;  Location: [place];  Service: Thoracic;  Laterality: N/A;  . PLANTAR FASCIA RELEASE    . port-a-cath insertion    . SPLENECTOMY     lymphoma  . TENDON RELEASE     Right thumb   . VAGINAL HYSTERECTOMY    . VENTRAL HERNIA REPAIR  1998  . VIDEO BRONCHOSCOPY WITH ENDOBRONCHIAL ULTRASOUND N/A 10/17/2014   Procedure: VIDEO BRONCHOSCOPY WITH ENDOBRONCHIAL ULTRASOUND;  Surgeon: Javier Glazier, MD;  Location: [place];  Service: Thoracic;  Laterality: N/A;  . VIDEO BRONCHOSCOPY WITH ENDOBRONCHIAL ULTRASOUND N/A 11/01/2014   Procedure: VIDEO BRONCHOSCOPY WITH ENDOBRONCHIAL ULTRASOUND;  Surgeon: Grace Isaac, MD;  Location: [place];  Service: Thoracic;  Laterality: N/A;    There were no vitals filed for this visit.      Subjective Assessment - 09/05/15 1504    Subjective Pt states she  feels "so-so" again today. She is having CT scan of sinuses tomorrow due to continued infection.   How long can you stand comfortably? 48mn   Patient Stated Goals get stronger, walk farther   Currently in Pain? No/denies       Therex:  Resisted walking 4-way 10# x 3 laps; increased difficulty going R, CGA throughout Monster walks with RTB, 123fx 5 laps; CGA, slight unsteadiness during retro Sit <> stand with eccentric lowering (x5 sec), 2x10; min verbal cues for proper technique  NMR: Cone taps on airex with no UE support x 10 BLE Bil staggered stance with front foot on dynadisc and circles in/out with 3# weight bar, 2x10 out, 1x10 in, each leg; CGA for balance,  increased lateral LOB bil, R back > L back         PT Education - 09/05/15 1507    Education provided Yes   Education Details exercise technique, weight shifting   Person(s) Educated Patient   Methods Explanation   Comprehension Verbalized understanding             PT Long Term Goals - 08/15/15 1304      PT LONG TERM GOAL #1   Title pt will perform 5x sit to stand in <15s to improve LE strength    Baseline 22.9 s (08/13/15)   Time 4   Period Weeks   Status Partially Met     PT LONG TERM GOAL #2   Title pt will ambulate at 1.66m8mto improve her community mobility    Baseline 1.44m/32m7/3/17)   Time 4   Period Weeks   Status Achieved     PT LONG TERM GOAL #3   Title pt will improve 6min866mlk by 150ft 61fnstrating improved activity tolerance.    Baseline 740ft w24f1 seated rest break    Time 4   Period Weeks   Status Achieved     PT LONG TERM GOAL #4   Title pt will increase berg balance score +6 pts in order to reduce fall risk    Baseline 52/56 (08/13/15)   Time 4   Period Weeks   Status Achieved     PT LONG TERM GOAL #5   Title pt will improve DGI score by +3 pts to demonstrated improved dynamic balance and decrease risk for falls.   Baseline 18/24 (08/13/15)   Time 4   Period Weeks   Status New               Plan - 09/05/15 1517    Clinical Impression Statement pt continues to make progress towards goals. Her main limiting factor continues to be msucle fatigue, R>L. Her dynamic balance is improving but she still demonstrates increased hip flexion to maintain balance. Pt will be reassessed next session for progress note.    Rehab Potential Good   Clinical Impairments Affecting Rehab Potential co-morbidities   PT Frequency 2x / week   PT Duration 4 weeks   PT Treatment/Interventions Neuromuscular re-education;Balance training;Therapeutic exercise;Therapeutic activities;Functional mobility training;Stair training;Gait training;Manual lymph  drainage;Passive range of motion   PT Next Visit Plan HEP   Consulted and Agree with Plan of Care Patient      Patient will benefit from skilled therapeutic intervention in order to improve the following deficits and impairments:  Decreased strength, Difficulty walking, Impaired flexibility, Pain, Decreased coordination, Decreased endurance, Decreased balance, Impaired sensation, Decreased activity tolerance  Visit Diagnosis: Muscle weakness (generalized)     Problem List Patient  Active Problem List   Diagnosis Date Noted  . Cough 07/18/2015  . Port catheter in place 07/11/2015  . Generalized weakness 05/11/2015  . Benign essential HTN   . PAF (paroxysmal atrial fibrillation) ([place])   . Internal hemorrhoids 02/21/2015  . Rectal bleeding 02/06/2015  . 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 Thrombocytopenia ([place]) 01/29/2015  . AKI (acute kidney injury) ([place])   . Elevated troponin   . Atrial fibrillation with rapid ventricular response ([place]) 01/23/2015  . New onset a-fib ([place]) 01/23/2015  . Hypertension 01/23/2015  . CHF (congestive heart failure) ([place]) 01/21/2015  . Chronic systolic CHF (congestive heart failure), NYHA class 2 ([place]) 01/11/2015  . Fever, unspecified 01/10/2015  . Immunocompromised status associated with infection ([place]) 01/08/2015  . 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 Preventive measure 11/14/2014  . Parotid mass 11/14/2014  . Chronic lower back pain 09/28/2014  . Mediastinal lymphadenopathy 09/27/2014  . Neuropathy due to chemotherapeutic drug ([place]) 06/19/2014  . Peripheral neuropathy due to chemotherapy ([place]) 04/03/2014  . Trochanteric bursitis of right hip 04/03/2014  . Anemia in chronic illness 02/21/2014  . Elevated liver enzymes 02/21/2014  . Urinary tract infection 11/18/2013  . Candidal intertrigo 09/23/2013  .  Atrophic vaginitis 09/23/2013  . Drug-induced neutropenia ([place]) 09/01/2013  . History of colon cancer 06/17/2013  . History of hodgkin's lymphoma 05/20/2013  . Dysuria 05/20/2013  . Conjunctivitis, acute 04/27/2013  . Cold sore 04/27/2013  . Left temporal headache 03/11/2013  . Hyperglycemia 03/11/2013  . Acute maxillary sinusitis 02/09/2013  . Other malaise and fatigue 01/04/2013  . Herpes zoster 11/22/2012  . Vaginal pain 11/22/2012  . Cystocele 09/22/2012  . Encounter for Medicare annual wellness exam 09/17/2012  . Left knee pain 09/10/2012  . Thoracic back pain 06/01/2012  . Skin lesion of face 05/17/2012  . Other screening mammogram 07/22/2011  . Routine gynecological examination 07/22/2011  . Colon cancer screening 07/22/2011  . History of anemia 05/06/2011  . 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 Hypothyroid 02/24/2011  . Varicose veins 02/24/2011  . Elevated blood pressure 11/20/2010  . Obesity 11/20/2010  . 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 Other chronic sinusitis 07/03/2009  . THROAT PAIN, CHRONIC 07/03/2009  . GERD 12/20/2008  . Anxiety state, unspecified 08/22/2008  . MENOPAUSAL SYNDROME 07/13/2007  . HYPERCHOLESTEROLEMIA, PURE 07/08/2006  . Allergic rhinitis 07/08/2006  . OVERACTIVE BLADDER 07/08/2006  . 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 Depression 06/25/2006  . PERIPHERAL VASCULAR DISEASE 06/25/2006  . OSTEOARTHRITIS 06/25/2006  . LEG EDEMA 06/25/2006  . SKIN CANCER, HX OF 06/25/2006  . COLONIC POLYPS, HX OF 06/25/2006   Geraldine Solar, SPT This entire session was performed under direct supervision and direction of a licensed therapist/therapist assistant . I have personally read, edited and approve of the note as written. Gorden Harms. Tortorici, PT, DPT [phone]   Tortorici,Ashley 09/05/2015, 3:58 PM  [place] MAIN [facility] SERVICES [address] [place], [place], [address] Phone: [phone]   Fax:   [phone]  Name: Diamond Boyd MRN: [id number] Date of Birth: Jul 26, 1940

## 2015-09-06 ENCOUNTER — Ambulatory Visit (INDEPENDENT_AMBULATORY_CARE_PROVIDER_SITE_OTHER)
Admission: RE | Admit: 2015-09-06 | Discharge: 2015-09-06 | Disposition: A | Discharge status: Home / Self Care | Payer: Medicare Other | Source: Ambulatory Visit | Attending: Family Medicine | Admitting: Family Medicine

## 2015-09-06 DIAGNOSIS — J328 Other chronic sinusitis: Secondary | ICD-10-CM

## 2015-09-06 DIAGNOSIS — J329 Chronic sinusitis, unspecified: Secondary | ICD-10-CM | POA: Diagnosis not present

## 2015-09-07 ENCOUNTER — Telehealth: Payer: Self-pay | Admitting: Family Medicine

## 2015-09-07 DIAGNOSIS — J328 Other chronic sinusitis: Secondary | ICD-10-CM

## 2015-09-07 MED ORDER — AZITHROMYCIN 250 MG PO TABS: ORAL_TABLET | ORAL | 0 refills | Status: DC

## 2015-09-07 NOTE — Telephone Encounter (Signed)
Pt notified of CT results and Dr. Marliss Coots comments. Pt will go get abx and she does want a ENT referral. Pt wants to go to Dr. Redmond Baseman in [place], please put referral in and I advised pt our [facility] will call and schedule appt

## 2015-09-07 NOTE — Telephone Encounter (Signed)
Ref done  

## 2015-09-07 NOTE — Telephone Encounter (Signed)
CT shows mild sinusitis -inflammation in left sinus (in cheek) where she has discomfort  I will send a zpak to her cvx  I want to ref her to ENT for recurrent sinusitis-let me know if agreeable

## 2015-09-10 ENCOUNTER — Other Ambulatory Visit: Payer: Self-pay | Admitting: *Deleted

## 2015-09-10 ENCOUNTER — Encounter: Payer: Self-pay | Admitting: Hematology and Oncology

## 2015-09-10 ENCOUNTER — Ambulatory Visit: Payer: Medicare Other

## 2015-09-10 ENCOUNTER — Telehealth: Payer: Self-pay | Admitting: Hematology and Oncology

## 2015-09-10 ENCOUNTER — Ambulatory Visit (HOSPITAL_BASED_OUTPATIENT_CLINIC_OR_DEPARTMENT_OTHER): Payer: Medicare Other | Admitting: Hematology and Oncology

## 2015-09-10 ENCOUNTER — Ambulatory Visit (HOSPITAL_BASED_OUTPATIENT_CLINIC_OR_DEPARTMENT_OTHER): Payer: Medicare Other

## 2015-09-10 ENCOUNTER — Other Ambulatory Visit (HOSPITAL_BASED_OUTPATIENT_CLINIC_OR_DEPARTMENT_OTHER): Payer: Medicare Other

## 2015-09-10 ENCOUNTER — Telehealth: Payer: Self-pay | Admitting: *Deleted

## 2015-09-10 VITALS — BP 141/58 | HR 88 | Temp 98.0°F | Resp 18 | Ht 65.0 in | Wt 248.6 lb

## 2015-09-10 DIAGNOSIS — I5022 Chronic systolic (congestive) heart failure: Secondary | ICD-10-CM

## 2015-09-10 DIAGNOSIS — J309 Allergic rhinitis, unspecified: Secondary | ICD-10-CM | POA: Diagnosis not present

## 2015-09-10 DIAGNOSIS — D801 Nonfamilial hypogammaglobulinemia: Secondary | ICD-10-CM

## 2015-09-10 DIAGNOSIS — C8228 Follicular lymphoma grade III, unspecified, lymph nodes of multiple sites: Secondary | ICD-10-CM

## 2015-09-10 DIAGNOSIS — Z95828 Presence of other vascular implants and grafts: Secondary | ICD-10-CM

## 2015-09-10 LAB — COMPREHENSIVE METABOLIC PANEL
ALBUMIN: 3.6 g/dL (ref 3.5–5.0)
ALT: 24 U/L (ref 0–55)
AST: 25 U/L (ref 5–34)
Alkaline Phosphatase: 146 U/L (ref 40–150)
Anion Gap: 9 mEq/L (ref 3–11)
BUN: 13.5 mg/dL (ref 7.0–26.0)
CALCIUM: 9.3 mg/dL (ref 8.4–10.4)
CHLORIDE: 105 meq/L (ref 98–109)
CO2: 28 mEq/L (ref 22–29)
CREATININE: 0.8 mg/dL (ref 0.6–1.1)
EGFR: 68 mL/min/{1.73_m2} — ABNORMAL LOW (ref >90)
GLUCOSE: 65 mg/dL — AB (ref 70–140)
Potassium: 3.5 mEq/L (ref 3.5–5.1)
Sodium: 142 mEq/L (ref 136–145)
Total Bilirubin: 0.33 mg/dL (ref 0.20–1.20)
Total Protein: 6.2 g/dL — ABNORMAL LOW (ref 6.4–8.3)

## 2015-09-10 LAB — CBC WITH DIFFERENTIAL/PLATELET
BASO%: 0.8 % (ref 0.0–2.0)
Basophils Absolute: 0.1 10*3/uL (ref 0.0–0.1)
EOS%: 2.1 % (ref 0.0–7.0)
Eosinophils Absolute: 0.2 10*3/uL (ref 0.0–0.5)
HEMATOCRIT: 37.2 % (ref 34.8–46.6)
HEMOGLOBIN: 12.4 g/dL (ref 11.6–15.9)
LYMPH#: 1.8 10*3/uL (ref 0.9–3.3)
LYMPH%: 25.3 % (ref 14.0–49.7)
MCH: 31.7 pg (ref 25.1–34.0)
MCHC: 33.2 g/dL (ref 31.5–36.0)
MCV: 95.3 fL (ref 79.5–101.0)
MONO#: 1.5 10*3/uL — ABNORMAL HIGH (ref 0.1–0.9)
MONO%: 20.9 % — ABNORMAL HIGH (ref 0.0–14.0)
NEUT#: 3.7 10*3/uL (ref 1.5–6.5)
NEUT%: 50.9 % (ref 38.4–76.8)
Platelets: 224 10*3/uL (ref 145–400)
RBC: 3.9 10*6/uL (ref 3.70–5.45)
RDW: 15.5 % — AB (ref 11.2–14.5)
WBC: 7.2 10*3/uL (ref 3.9–10.3)

## 2015-09-10 MED ORDER — PREDNISONE 20 MG PO TABS: 20.0000 mg | ORAL_TABLET | Freq: Every day | ORAL | 0 refills | Status: DC

## 2015-09-10 MED ORDER — SODIUM CHLORIDE 0.9 % IJ SOLN
10.0000 mL | INTRAMUSCULAR | Status: DC | PRN
  Administered 2015-09-10: 10 mL via INTRAVENOUS
  Filled 2015-09-10: qty 10

## 2015-09-10 MED ORDER — PREDNISONE 20 MG PO TABS: 20.0000 mg | ORAL_TABLET | Freq: Every day | ORAL | Status: DC

## 2015-09-10 NOTE — Patient Instructions (Signed)

## 2015-09-10 NOTE — Telephone Encounter (Signed)
Gave pt cal & avs °

## 2015-09-10 NOTE — Assessment & Plan Note (Signed)
She has signs and symptoms of unresolved upper respiratory tract infection. She has been seen by her primary care doctor, allergist and is due to see ENT. CT maxillary sinus shows sinusitis. She had been on multiple courses of antibiotic therapy and other treatment for this. I recommend 10 days course of prednisone therapy. I recommend treatment with IVIG as above and she agreed to proceed

## 2015-09-10 NOTE — Assessment & Plan Note (Signed)
She has no evidence of fluid overload or recent exacerbation of congestive heart failure. She will continue close monitoring and follow-up with cardiologist

## 2015-09-10 NOTE — Assessment & Plan Note (Signed)
Recent PET CT scan from 07/10/2015 show significant improvement compared to prior PET scan. There were nonspecific activity in her bone and the left hilar of indeterminate etiology is. She is not symptomatic. I recommend close observation with repeat imaging study in 6 months, due around November 2017. 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 I discussed with her the role of maintenance rituximab Studies have shown maintenance treatment with rituximab for 2 years improve progression free survival Clinically, she has no evidence of disease today. She had received multiple doses of rituximab but then developed unresolved upper respiratory tract infection. 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 I will check her immunoglobulin levels today and hold off rituximab.

## 2015-09-10 NOTE — Progress Notes (Signed)
[place] OFFICE PROGRESS NOTE  Patient Care Team: Abner Greenspan, MD as PCP - General Mosetta Anis, MD (Allergy) Fanny Skates, MD as Consulting Physician (General Surgery) Grace Isaac, MD as Consulting Physician (Cardiothoracic Surgery) Troy Sine, MD as Consulting Physician (Cardiology) Lonn Georgia, PA-C as Physician Assistant (Cardiology) [name], MD as Consulting Physician (Hematology and Oncology) Estill Cotta, MD as Consulting Physician (Ophthalmology)  SUMMARY OF ONCOLOGIC HISTORY: Oncology History   History of colon cancer   Staging form: Colon and Rectum, AJCC 7th Edition     Clinical: Stage IIA (T3, N0, M0) - Signed by [name], MD on 02/21/2014 History of hodgkin's lymphoma   Staging form: Lymphoid Neoplasms, AJCC 6th Edition     Clinical: Stage IV - Signed by [name], MD on 02/21/2014       History of hodgkin's lymphoma   05/20/2013 Initial Diagnosis    Hodgkin lymphoma     06/27/2014 Imaging    Interval increase in metabolic activity of several small lymph nodes is concerning for lymphoma recurrence. Lymph nodes include a small right level 2 lymph node, right hilar lymph node, and right paratracheal lymph node     07/07/2014 Pathology Results     limited tissue from ultrasound-guided biopsy was nondiagnostic     07/07/2014 Procedure     ultrasound-guided biopsy was nondiagnostic     09/27/2014 Imaging    Repeat PET CT scan show diffuse disease concern for relapse.     10/17/2014 Pathology Results    Accession: [id number] Biopsy was nondiagnostic     10/17/2014 Procedure    She underwent bronchoscopy & EBUS & biopsy of  LN at Level 10L     11/01/2014 Surgery    She underwent cronchoscopy with endobronchial ultrasound, mediastinoscopy and biopsy      11/01/2014 Pathology Results    Accession: [id number] biopsy showed no evidence of lymphoma, only granulomas.     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 ([place])     12/13/2014 Imaging    PET scan showed diffuse disease     12/28/2014 - 12/30/2014 Hospital Admission    She was admitted to the hospital for cycle one of R-ICE     01/18/2015 - 01/20/2015 Chemotherapy    She was admitted to the hospital for cycle 2 of R-ICE     01/20/2015 - 01/22/2015 Hospital Admission    She was then admitted to [facility] with respiratory failure/fluid overload and was noted to have mild cardiomyopathy     01/23/2015 - 01/27/2015 Hospital Admission    she was admitted to the hospital with new onset atrial fibrillation with rapid ventricular response and was found to have evidence of myocardial ischemia. She was discharged on medical management and oral anticoagulation therapy     01/25/2015 Procedure    Left heart  cath showed non-obstructive lesions: Prox RCA lesion, 40% stenosed.   Prox LAD to Mid LAD lesion, 30% stenosed. Ost LM lesion, 25% stenosed     01/30/2015 Imaging    PET Ct scan showed near complete resolution of all disease     03/12/2015 - 09/10/2015 Chemotherapy    She received 3 doses of Rituximab every other month     07/10/2015 PET scan    PET showed Improved metabolic activity in the bony lesions with mild persistent hilar LN with increased SUV uptake     09/06/2015 Imaging    Ct scan showed interval development of mild mucosal thickening involving left maxillary and bilateral ethmoid sinuses  consistent with mild sinusitis.      INTERVAL HISTORY: Please see below for problem oriented charting. She returns for rituximab treatment Over the last 3 months, she has persistent cough, nasal drainage and allergy-like symptoms. She has been seen by multiple doctors including her own allergist and was prescribed multiple courses of oral antibiotic therapy and inhalers. She had mild cough secondary to persistent nasal drainage. Denies fever or chills. She had CT scan done recently which show sinusitis. She is due to see ENT.  REVIEW OF SYSTEMS:   Constitutional: Denies fevers, chills or abnormal weight loss Eyes: Denies blurriness of vision Cardiovascular: Denies palpitation, chest discomfort or lower extremity swelling Gastrointestinal:  Denies nausea, heartburn or change in bowel habits Skin: Denies abnormal skin rashes Lymphatics: Denies new lymphadenopathy or easy bruising Neurological:Denies numbness, tingling or new weaknesses Behavioral/Psych: Mood is stable, no new changes  All other systems were reviewed with the patient and are negative.  I have reviewed the past medical history, past surgical history, social history and family history with the patient and they are unchanged from previous note.  ALLERGIES:  is allergic to achromycin [tetracycline hcl]; allopurinol; astelin [azelastine hcl]; cephalexin; codeine; lisinopril; meloxicam; minocycline; nabumetone; nyquil [pseudoeph-doxylamine-dm-apap]; penicillins; sulfa antibiotics; zolpidem tartrate; buspar [buspirone hcl]; and ciprofloxacin.  MEDICATIONS:  Current Outpatient Prescriptions  Medication Sig Dispense Refill  . acyclovir (ZOVIRAX) 400 MG tablet Take 1 tablet (400 mg total) by mouth daily. 90 tablet 3  . Albuterol Sulfate (PROAIR RESPICLICK) 696 (90 Base) MCG/ACT AEPB Inhale 1-2 puffs into the lungs every 6 (six) hours as needed.    Marland Kitchen aspirin 81 MG tablet Take 81 mg by mouth daily.    Marland Kitchen azithromycin (ZITHROMAX  Z-PAK) 250 MG tablet Take 2 pills by mouth today and then 1 pill daily for 4 days 6 tablet 0  . Calcium Carbonate-Vitamin D (CALCIUM 600+D) 600-400 MG-UNIT per tablet Take 1 tablet by mouth every evening.     . celecoxib (CELEBREX) 200 MG capsule Take 1 capsule (200 mg total) by mouth daily as needed for moderate pain. 90 capsule 3  . Cholecalciferol (VITAMIN D-3) 1000 UNITS CAPS Take 1,000 Units by mouth daily.    Marland Kitchen docusate sodium (COLACE) 100 MG capsule Take 100 mg by mouth at bedtime.    Marland Kitchen estradiol (ESTRACE) 1 MG tablet Take 1 tablet (1 mg total) by mouth daily. 90 tablet 3  . fexofenadine (ALLEGRA) 180 MG tablet Take 1 tablet (180 mg total) by mouth daily. Reported on 04/23/2015 90 tablet 3  . FLUoxetine (PROZAC) 20 MG capsule Take 1 capsule (20 mg total) by mouth daily. 90 capsule 3  . fluticasone (FLONASE) 50 MCG/ACT nasal spray Place 2 sprays into both nostrils daily. 48 g 3  . furosemide (LASIX) 20 MG tablet Take 1 tablet (20 mg total)  by mouth daily. Take 1 tab by mouth daily for weight gain. 90 tablet 3  . gabapentin (NEURONTIN) 300 MG capsule Take 1 capsule (300 mg total) by mouth 2 (two) times daily. 180 capsule 3  . levothyroxine (SYNTHROID, LEVOTHROID) 112 MCG tablet Take 1 tablet (112 mcg total) by mouth daily before breakfast. 90 tablet 3  . lidocaine-prilocaine (EMLA) cream Apply 1 application topically as needed (For port-a-cath.). Reported on 02/22/2015    . losartan (COZAAR) 25 MG tablet Take 1 tablet (25 mg total) by mouth daily. 30 tablet 11  . metoprolol (LOPRESSOR) 50 MG tablet TAKE 1 TABLET (50 MG TOTAL) BY MOUTH 2 (TWO) TIMES DAILY. 180 tablet 3  . Omega-3 350 MG CAPS Take 1 capsule by mouth daily. Reported on 02/22/2015    . omeprazole (PRILOSEC) 40 MG capsule Take 1 capsule (40 mg total) by mouth daily. 90 capsule 3  . psyllium (METAMUCIL) 58.6 % packet Take 1-3 packets by mouth at bedtime.     . simvastatin (ZOCOR) 40 MG tablet Take 1 tablet by mouth at  bedtime 90  tablet 1  . acetaminophen (TYLENOL) 650 MG CR tablet Take 650 mg by mouth every 8 (eight) hours as needed (For back pain.).    Marland Kitchen ondansetron (ZOFRAN) 8 MG tablet Take 8 mg by mouth every 6 (six) hours as needed for nausea or vomiting. Reported on 03/05/2015    . polyethylene glycol (MIRALAX / GLYCOLAX) packet Take 17 g by mouth at bedtime.    . predniSONE (DELTASONE) 20 MG tablet Take 1 tablet (20 mg total) by mouth daily with breakfast. 10 tablet 0  . prochlorperazine (COMPAZINE) 10 MG tablet Take 10 mg by mouth every 6 (six) hours as needed for nausea or vomiting. Reported on 03/05/2015    . vitamin E (VITAMIN E) 400 UNIT capsule Take 400 Units by mouth daily. Reported on 03/05/2015     No current facility-administered medications for this visit.     PHYSICAL EXAMINATION: ECOG PERFORMANCE STATUS: 1 - Symptomatic but completely ambulatory  Vitals:   09/10/15 1038  BP: (!) 141/58  Pulse: 88  Resp: 18  Temp: 98 F (36.7 C)   Filed Weights   09/10/15 1038  Weight: 248 lb 9.6 oz (112.8 kg)    GENERAL:alert, no distress and comfortable. She is obese SKIN: skin color, texture, turgor are normal, no rashes or significant lesions EYES: normal, Conjunctiva are pink and non-injected, sclera clear OROPHARYNX:no exudate, no erythema and lips, buccal mucosa, and tongue normal  NECK: supple, thyroid normal size, non-tender, without nodularity LYMPH:  no palpable lymphadenopathy in the cervical, axillary or inguinal LUNGS: clear to auscultation and percussion with normal breathing effort HEART: regular rate & rhythm and no murmurs with moderate bilateral lower extremity edema ABDOMEN:abdomen soft, non-tender and normal bowel sounds Musculoskeletal:no cyanosis of digits and no clubbing  NEURO: alert & oriented x 3 with fluent speech, no focal motor/sensory deficits  LABORATORY DATA:  I have reviewed the data as listed    Component Value Date/Time   NA 142 09/10/2015 0957   K 3.5 09/10/2015  0957   CL 102 04/09/2015 1300   CL 105 07/14/2012 0939   CO2 28 09/10/2015 0957   GLUCOSE 65 (L) 09/10/2015 0957   GLUCOSE 103 (H) 07/14/2012 0939   BUN 13.5 09/10/2015 0957   CREATININE 0.8 09/10/2015 0957   CALCIUM 9.3 09/10/2015 0957   PROT 6.2 (L) 09/10/2015 0957   ALBUMIN 3.6 09/10/2015 0957   AST 25  09/10/2015 0957   ALT 24 09/10/2015 0957   ALKPHOS 146 09/10/2015 0957   BILITOT 0.33 09/10/2015 0957   GFRNONAA 53 (L) 01/27/2015 0625   GFRAA >60 01/27/2015 0625    No results found for: SPEP, UPEP  Lab Results  Component Value Date   WBC 7.2 09/10/2015   NEUTROABS 3.7 09/10/2015   HGB 12.4 09/10/2015   HCT 37.2 09/10/2015   MCV 95.3 09/10/2015   PLT 224 09/10/2015      Chemistry      Component Value Date/Time   NA 142 09/10/2015 0957   K 3.5 09/10/2015 0957   CL 102 04/09/2015 1300   CL 105 07/14/2012 0939   CO2 28 09/10/2015 0957   BUN 13.5 09/10/2015 0957   CREATININE 0.8 09/10/2015 0957      Component Value Date/Time   CALCIUM 9.3 09/10/2015 0957   ALKPHOS 146 09/10/2015 0957   AST 25 09/10/2015 0957   ALT 24 09/10/2015 0957   BILITOT 0.33 09/10/2015 0957       RADIOGRAPHIC STUDIES:I reviewed the CT dated 09/06/15 I have personally reviewed the radiological images as listed and agreed with the findings in the report.    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 There were nonspecific activity in her bone and the left hilar of indeterminate etiology is. She is not symptomatic. I recommend close observation with repeat imaging study in 6 months, due around November 2017. 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 I discussed with her the role of maintenance rituximab Studies have shown maintenance treatment with rituximab for 2 years  improve progression free survival Clinically, she has no evidence of disease today. She had received multiple doses of rituximab but then developed unresolved upper respiratory tract infection. 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 I will check her immunoglobulin levels today and hold off rituximab.  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 I will draw baseline gammaglobulin levels today. I discussed the importance of discontinuation of rituximab and to consider monthly IVIG treatment. The risks, benefit, side effects of IVIG treatment including risk of allergic reaction, hives, itching, serum sickness were discussed and she agreed to proceed. I would dose her at 1 g/kg once every 4 weeks  Chronic systolic CHF (congestive heart failure), NYHA class 2 ([place]) She has no evidence of fluid overload or recent exacerbation of congestive heart failure. She will continue close monitoring and follow-up with cardiologist    Allergic rhinitis She has signs and symptoms of unresolved upper respiratory tract infection. She has been seen by her primary care doctor, allergist and is due to see ENT. CT maxillary sinus shows sinusitis. She had been on multiple courses of antibiotic therapy and other treatment for this. I recommend 10 days course of prednisone therapy. I recommend treatment with IVIG as above and she agreed to proceed   Orders Placed This Encounter  Procedures  . IgG, IgA, IgM    Standing Status:   Future    Number of Occurrences:   1    Standing Expiration Date:   10/14/2016   All questions were answered. The patient knows to call the clinic with any problems, questions or concerns. No barriers to learning was detected. I  spent 30 minutes counseling the patient face to face. The total time spent in the appointment was 40 minutes and more  than 50% was on counseling and review of test results     [facility], [place], MD 09/10/2015 11:19 AM

## 2015-09-10 NOTE — Telephone Encounter (Signed)
left msg confirming 8/7 apt time

## 2015-09-10 NOTE — Telephone Encounter (Signed)
Per staff message and POF I have scheduled appts. Advised scheduler of appts. JMW  

## 2015-09-10 NOTE — Assessment & Plan Note (Signed)
I suspect she has acquired hypogammaglobulinemia from prior treatment causing unresolved upper respiratory tract infection. I will draw baseline gammaglobulin levels today. I discussed the importance of discontinuation of rituximab and to consider monthly IVIG treatment. The risks, benefit, side effects of IVIG treatment including risk of allergic reaction, hives, itching, serum sickness were discussed and she agreed to proceed. I would dose her at 1 g/kg once every 4 weeks

## 2015-09-11 ENCOUNTER — Telehealth: Payer: Self-pay | Admitting: *Deleted

## 2015-09-11 ENCOUNTER — Other Ambulatory Visit: Payer: Self-pay | Admitting: Hematology and Oncology

## 2015-09-11 LAB — IGG, IGA, IGM
IGA/IMMUNOGLOBULIN A, SERUM: 6 mg/dL — AB (ref 64–422)
IGG (IMMUNOGLOBIN G), SERUM: 353 mg/dL — AB (ref 700–1600)
IgM, Qn, Serum: 8 mg/dL — ABNORMAL LOW (ref 26–217)

## 2015-09-11 NOTE — Telephone Encounter (Signed)
-----   Message from [name], MD sent at 09/11/2015  8:08 AM EDT ----- Regarding: lab results PLs let her know her immunoglobulin levels are very low Recommend IVIG as discussed yesterday Recommend she keeps her ENT appt  ----- Message ----- From: Interface, Lab In Three Zero One Sent: 09/11/2015   7:42 AM To: [name], MD

## 2015-09-11 NOTE — Telephone Encounter (Signed)
Pt notified of results/message below. Will come for IVIG on Monday

## 2015-09-12 ENCOUNTER — Ambulatory Visit: Payer: Medicare Other | Attending: Hematology and Oncology

## 2015-09-12 DIAGNOSIS — M6281 Muscle weakness (generalized): Secondary | ICD-10-CM | POA: Diagnosis not present

## 2015-09-12 NOTE — Therapy (Signed)
[place] MAIN [facility] SERVICES [address] [place], [place], [address] Phone: [phone]   Fax:  [phone]  Physical Therapy Treatment  Patient Details  Name: Diamond Boyd MRN: [id number] Date of Birth: Sep 14, 1940 Referring Provider: Alvy Bimler  Encounter Date: 09/12/2015      PT End of Session - 09/12/15 1039    Visit Number 15   Number of Visits 25   Date for PT Re-Evaluation 09/10/15   Authorization Type 7/10   PT Start Time 1020   PT Stop Time 1105   PT Time Calculation (min) 45 min   Equipment Utilized During Treatment Gait belt   Activity Tolerance Patient tolerated treatment well   Behavior During Therapy [facility] for tasks assessed/performed      Past Medical History:  Diagnosis Date  . Anemia    hx blood tx  . Benign essential HTN   . Cancer ([place])    skin cancer- basal cell on arm / colon 2011  . Chronic systolic CHF (congestive heart failure), NYHA class 2 ([place]) 01/2015  . Colon cancer ([place])    2011, s/p surgery, in remission  . Colon polyps   . Degenerative disk disease    Thoracic spine  . Depression   . 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 Generalized weakness 05/11/2015  . GERD (gastroesophageal reflux disease)   . Heart murmur   . hodgkins lymphoma   . Hypothyroidism   . Lymphoma ([place])   . Neuropathy due to chemotherapeutic drug ([place]) 06/19/2014  . NICM (nonischemic cardiomyopathy) ([place]) 01/2015  . Osteoarthritis    hands  . 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 PAF (paroxysmal atrial fibrillation) ([place])   . Peripheral vascular disease ([place])   . Pneumonia     Past Surgical History:  Procedure Laterality Date  . BONE MARROW BIOPSY  05/27/13  . BREAST BIOPSY  1996  . CARDIAC CATHETERIZATION N/A 01/25/2015   Procedure: Left Heart Cath and Coronary Angiography;  Surgeon: Peter M Martinique, MD;  Location: [place]  CV LAB;  Service: Cardiovascular;  Laterality: N/A;  . CATARACT EXTRACTION, BILATERAL  2016  . CHOLECYSTECTOMY    . COLECTOMY  8/11  . COLONOSCOPY W/ BIOPSIES    . DG BIOPSY LUNG    . LYMPH NODE BIOPSY N/A 11/01/2014   Procedure: MEDIASTINAL LYMPH NODE BIOPSY;  Surgeon: Grace Isaac, MD;  Location: [place];  Service: Thoracic;  Laterality: N/A;  . MEDIASTINOSCOPY N/A 11/01/2014   Procedure: MEDIASTINOSCOPY;  Surgeon: Grace Isaac, MD;  Location: [place];  Service: Thoracic;  Laterality: N/A;  . PLANTAR FASCIA RELEASE    . port-a-cath insertion    . SPLENECTOMY     lymphoma  . TENDON RELEASE     Right thumb   . VAGINAL HYSTERECTOMY    . VENTRAL HERNIA REPAIR  1998  . VIDEO BRONCHOSCOPY WITH ENDOBRONCHIAL ULTRASOUND N/A 10/17/2014   Procedure: VIDEO BRONCHOSCOPY WITH ENDOBRONCHIAL ULTRASOUND;  Surgeon: Javier Glazier, MD;  Location: [place];  Service: Thoracic;  Laterality: N/A;  . VIDEO BRONCHOSCOPY WITH ENDOBRONCHIAL ULTRASOUND N/A 11/01/2014   Procedure: VIDEO BRONCHOSCOPY WITH ENDOBRONCHIAL ULTRASOUND;  Surgeon: Grace Isaac, MD;  Location: [place];  Service: Thoracic;  Laterality: N/A;    There were no vitals filed for this visit.      Subjective Assessment - 09/12/15 1108    Subjective Pt reports she  feels tired today.   How long can you stand comfortably? 522mn   Patient Stated Goals get stronger, walk farther   Currently in Pain? No/denies                                 PT Education - 008/22/20171112    Education provided Yes   Education Details progressed HEP, progress   Person(s) Educated Patient   Methods Explanation   Comprehension Verbalized understanding             PT Long Term Goals - 0Aug 22, 20171021      PT LONG TERM GOAL #1   Title pt will perform 5x sit to stand in <15s to improve LE strength    Baseline 26.8 (808/22/2017   Time 4   Period Weeks   Status Partially Met     PT LONG TERM GOAL #2   Title pt will ambulate  at 1.045m to improve her community mobility    Baseline 1.22m37m(08/13/15)   Time 4   Period Weeks   Status Achieved     PT LONG TERM GOAL #3   Title pt will improve 6mi42malk by 150ft15fonstrating improved activity tolerance.    Baseline 740ft 23f 1 seated rest break    Time 4   Period Weeks   Status Achieved     PT LONG TERM GOAL #4   Title pt will increase berg balance score +6 pts in order to reduce fall risk    Baseline 52/56 (08/13/15)   Time 4   Period Weeks   Status Achieved     PT LONG TERM GOAL #5   Title pt will improve DGI score by +3 pts to demonstrated improved dynamic balance and decrease risk for falls.   Baseline 18/24 (8/2/17August 22, 2017me 4   Period Weeks   Status On-going               Plan - 09/12/06-22-17   Clinical Impression Statement SPT reassessed pt's goals and outcome measures this date. Pt continues to have deficits in BLE endurance (R>L) and balance, however, pt has improved greatly since beginning therapy. Pt reports she feels 50% better since beginning therapy. Pt DGI remained the same since last progress note and her 5xSTS is still decreased. SPT updated pt on her progress and due to pt's plateau in progress, she will be discharged to an advanced HEP. She was educated that if she feels she has a decline in function in the future that she can return to therapy with referral.    Rehab Potential Good   Clinical Impairments Affecting Rehab Potential co-morbidities   PT Frequency 2x / week   PT Duration 4 weeks   PT Treatment/Interventions Neuromuscular re-education;Balance training;Therapeutic exercise;Therapeutic activities;Functional mobility training;Stair training;Gait training;Manual lymph drainage;Passive range of motion   PT Next Visit Plan HEP   Consulted and Agree with Plan of Care Patient      Patient will benefit from skilled therapeutic intervention in order to improve the following deficits and impairments:  Decreased strength,  Difficulty walking, Impaired flexibility, Pain, Decreased coordination, Decreased endurance, Decreased balance, Impaired sensation, Decreased activity tolerance  Visit Diagnosis: Muscle weakness (generalized)       G-Codes - 09/12/06/22/17   Functional Assessment Tool Used DGO/5xsittostand    Functional Limitation Mobility: Walking and moving around   Mobility: Walking and Moving Around Current Status (  [id number]) At least 1 percent but less than 20 percent impaired, limited or restricted   Mobility: Walking and Moving Around Goal Status [phone]) At least 1 percent but less than 20 percent impaired, limited or restricted   Mobility: Walking and Moving Around Discharge Status [phone]) At least 1 percent but less than 20 percent impaired, limited or restricted      Problem List Patient Active Problem List   Diagnosis Date Noted  . Acquired hypogammaglobulinemia ([place]) 09/10/2015  . Cough 07/18/2015  . Port catheter in place 07/11/2015  . Generalized weakness 05/11/2015  . Benign essential HTN   . PAF (paroxysmal atrial fibrillation) ([place])   . Internal hemorrhoids 02/21/2015  . Rectal bleeding 02/06/2015  . 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 Thrombocytopenia ([place]) 01/29/2015  . AKI (acute kidney injury) ([place])   . Elevated troponin   . Atrial fibrillation with rapid ventricular response ([place]) 01/23/2015  . New onset a-fib ([place]) 01/23/2015  . Hypertension 01/23/2015  . CHF (congestive heart failure) ([place]) 01/21/2015  . Chronic systolic CHF (congestive heart failure), NYHA class 2 ([place]) 01/11/2015  . Fever, unspecified 01/10/2015  . Immunocompromised status associated with infection ([place]) 01/08/2015  . 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 Preventive measure 11/14/2014  . Parotid mass 11/14/2014  . Chronic lower back pain 09/28/2014  . Mediastinal  lymphadenopathy 09/27/2014  . Neuropathy due to chemotherapeutic drug ([place]) 06/19/2014  . Peripheral neuropathy due to chemotherapy ([place]) 04/03/2014  . Trochanteric bursitis of right hip 04/03/2014  . Anemia in chronic illness 02/21/2014  . Elevated liver enzymes 02/21/2014  . Urinary tract infection 11/18/2013  . Candidal intertrigo 09/23/2013  . Atrophic vaginitis 09/23/2013  . Drug-induced neutropenia ([place]) 09/01/2013  . History of colon cancer 06/17/2013  . History of hodgkin's lymphoma 05/20/2013  . Dysuria 05/20/2013  . Conjunctivitis, acute 04/27/2013  . Cold sore 04/27/2013  . Left temporal headache 03/11/2013  . Hyperglycemia 03/11/2013  . Acute maxillary sinusitis 02/09/2013  . Other malaise and fatigue 01/04/2013  . Herpes zoster 11/22/2012  . Vaginal pain 11/22/2012  . Cystocele 09/22/2012  . Encounter for Medicare annual wellness exam 09/17/2012  . Left knee pain 09/10/2012  . Thoracic back pain 06/01/2012  . Skin lesion of face 05/17/2012  . Other screening mammogram 07/22/2011  . Routine gynecological examination 07/22/2011  . Colon cancer screening 07/22/2011  . History of anemia 05/06/2011  . 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 Hypothyroid 02/24/2011  . Varicose veins 02/24/2011  . Elevated blood pressure 11/20/2010  . Obesity 11/20/2010  . 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 Other chronic sinusitis 07/03/2009  . THROAT PAIN, CHRONIC 07/03/2009  . GERD 12/20/2008  . Anxiety state, unspecified 08/22/2008  . MENOPAUSAL SYNDROME 07/13/2007  . HYPERCHOLESTEROLEMIA, PURE 07/08/2006  . Allergic rhinitis 07/08/2006  . OVERACTIVE BLADDER 07/08/2006  . 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 Depression 06/25/2006  . PERIPHERAL VASCULAR DISEASE 06/25/2006  . OSTEOARTHRITIS 06/25/2006  . LEG EDEMA 06/25/2006  . SKIN CANCER, HX OF 06/25/2006  . COLONIC POLYPS, HX OF 06/25/2006    Geraldine Solar, SPT This entire session was performed under direct  supervision and direction of a licensed therapist/therapist assistant . I have personally read, edited and approve of the note as written.  Gorden Harms. Erielle Gawronski, PT, DPT [phone]  Vamsi Apfel 09/12/2015, 3:15 PM  [facility]  MAIN [facility] SERVICES [place], [place], [address] Phone: [phone]   Fax:  [phone]  Name: ILO BEAMON MRN: [id number] Date of Birth: 1940-10-18

## 2015-09-12 NOTE — Patient Instructions (Signed)
WALKING  Walking is a great form of exercise to increase your strength, endurance and overall fitness.  A walking program can help you start slowly and gradually build endurance as you go.  Everyone's ability is different, so each person's starting point will be different.  You do not have to follow them exactly.  The are just samples. You should simply find out what's right for you and stick to that program.   In the beginning, you'll start off walking 2-3 times a day for short distances.  As you get stronger, you'll be walking further at just 1-2 times per day.  A. You Can Walk For A Certain Length Of Time Each Day    Walk 5 minutes 3 times per day.  Increase 2 minutes every 2 days (3 times per day).  Work up to 25-30 minutes (1-2 times per day).   Example:   Day 1-2 5 minutes 3 times per day   Day 7-8 12 minutes 2-3 times per day   Day 13-14 25 minutes 1-2 times per day  B. You Can Walk For a Certain Distance Each Day     Distance can be substituted for time.    Example:   3 trips to mailbox (at road)   3 trips to corner of block   3 trips around the block  C. Go to local [employer] and use the track.    Walk for distance ____ around track  Or time ____ minutes

## 2015-09-12 NOTE — Therapy (Signed)
[place] MAIN [facility] SERVICES [address] [place], [place], [address] Phone: [phone]   Fax:  [phone]  Physical Therapy Treatment/Discharge Summary  Patient Details  Name: Diamond Boyd MRN: [id number] Date of Birth: 16-Feb-1940 Referring Provider: Alvy Bimler  Encounter Date: 09/12/2015      PT End of Session - 09/12/15 1039    Visit Number 15   Number of Visits 25   Date for PT Re-Evaluation 09/10/15   Authorization Type 7/10   PT Start Time 1020   PT Stop Time 1105   PT Time Calculation (min) 45 min   Equipment Utilized During Treatment Gait belt   Activity Tolerance Patient tolerated treatment well   Behavior During Therapy [facility] for tasks assessed/performed      Past Medical History:  Diagnosis Date  . Anemia    hx blood tx  . Benign essential HTN   . Cancer ([place])    skin cancer- basal cell on arm / colon 2011  . Chronic systolic CHF (congestive heart failure), NYHA class 2 ([place]) 01/2015  . Colon cancer ([place])    2011, s/p surgery, in remission  . Colon polyps   . Degenerative disk disease    Thoracic spine  . Depression   . 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 Generalized weakness 05/11/2015  . GERD (gastroesophageal reflux disease)   . Heart murmur   . hodgkins lymphoma   . Hypothyroidism   . Lymphoma ([place])   . Neuropathy due to chemotherapeutic drug ([place]) 06/19/2014  . NICM (nonischemic cardiomyopathy) ([place]) 01/2015  . Osteoarthritis    hands  . 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 PAF (paroxysmal atrial fibrillation) ([place])   . Peripheral vascular disease ([place])   . Pneumonia     Past Surgical History:  Procedure Laterality Date  . BONE MARROW BIOPSY  05/27/13  . BREAST BIOPSY  1996  . CARDIAC CATHETERIZATION N/A 01/25/2015   Procedure: Left Heart Cath and Coronary Angiography;  Surgeon: Peter M Martinique, MD;   Location: [place] CV LAB;  Service: Cardiovascular;  Laterality: N/A;  . CATARACT EXTRACTION, BILATERAL  2016  . CHOLECYSTECTOMY    . COLECTOMY  8/11  . COLONOSCOPY W/ BIOPSIES    . DG BIOPSY LUNG    . LYMPH NODE BIOPSY N/A 11/01/2014   Procedure: MEDIASTINAL LYMPH NODE BIOPSY;  Surgeon: Grace Isaac, MD;  Location: [place];  Service: Thoracic;  Laterality: N/A;  . MEDIASTINOSCOPY N/A 11/01/2014   Procedure: MEDIASTINOSCOPY;  Surgeon: Grace Isaac, MD;  Location: [place];  Service: Thoracic;  Laterality: N/A;  . PLANTAR FASCIA RELEASE    . port-a-cath insertion    . SPLENECTOMY     lymphoma  . TENDON RELEASE     Right thumb   . VAGINAL HYSTERECTOMY    . VENTRAL HERNIA REPAIR  1998  . VIDEO BRONCHOSCOPY WITH ENDOBRONCHIAL ULTRASOUND N/A 10/17/2014   Procedure: VIDEO BRONCHOSCOPY WITH ENDOBRONCHIAL ULTRASOUND;  Surgeon: Javier Glazier, MD;  Location: [place];  Service: Thoracic;  Laterality: N/A;  . VIDEO BRONCHOSCOPY WITH ENDOBRONCHIAL ULTRASOUND N/A 11/01/2014   Procedure: VIDEO BRONCHOSCOPY WITH ENDOBRONCHIAL ULTRASOUND;  Surgeon: Grace Isaac, MD;  Location: [place];  Service: Thoracic;  Laterality: N/A;    There were no vitals filed for this visit.      Subjective Assessment - 09/12/15 1108    Subjective Pt reports  she feels tired today.   How long can you stand comfortably? 82mn   Patient Stated Goals get stronger, walk farther   Currently in Pain? No/denies       Therex: Outcome measures: 5xSTS: 26.8s; increased risk for falls DGI: 18/24; increased risk for falls  Marching on airex pad with no UE support, 1x10 each; CGA for balance, pt required occasional use of UE support for balance Bil SLS on airex pad with minimal to no UE support (1 finger), 5x10 sec hold each; CGA for balance Sit <> stand with eccentric lowering (5 sec), 2x5; min verbal cues for proper technique Wall sits, 5x5 sec hold; min verbal cues for proper technique       PT Education -  09/12/15 1112    Education provided Yes   Education Details progressed HEP, progress   Person(s) Educated Patient   Methods Explanation   Comprehension Verbalized understanding             PT Long Term Goals - 09/12/15 1021      PT LONG TERM GOAL #1   Title pt will perform 5x sit to stand in <15s to improve LE strength    Baseline 26.8 (09/12/15)   Time 4   Period Weeks   Status Partially Met     PT LONG TERM GOAL #2   Title pt will ambulate at 1.024m to improve her community mobility    Baseline 1.27m23m(08/13/15)   Time 4   Period Weeks   Status Achieved     PT LONG TERM GOAL #3   Title pt will improve 6mi38malk by 150ft30fonstrating improved activity tolerance.    Baseline 740ft 22f 1 seated rest break    Time 4   Period Weeks   Status Achieved     PT LONG TERM GOAL #4   Title pt will increase berg balance score +6 pts in order to reduce fall risk    Baseline 52/56 (08/13/15)   Time 4   Period Weeks   Status Achieved     PT LONG TERM GOAL #5   Title pt will improve DGI score by +3 pts to demonstrated improved dynamic balance and decrease risk for falls.   Baseline 18/24 (09/12/15)   Time 4   Period Weeks   Status On-going               Plan - 09/12/15 1113    Clinical Impression Statement SPT reassessed pt's goals and outcome measures this date. Pt continues to have deficits in BLE endurance (R>L) and balance, however, pt has improved greatly since beginning therapy. Pt reports she feels 50% better since beginning therapy. Pt DGI remained the same since last progress note and her 5xSTS is still decreased. SPT updated pt on her progress and due to pt's plateau in progress, she will be discharged to an advanced HEP. She was educated that if she feels she has a decline in function in the future that she can return to therapy with referral.    Rehab Potential Good   Clinical Impairments Affecting Rehab Potential co-morbidities   PT Frequency 2x / week   PT  Duration 4 weeks   PT Treatment/Interventions Neuromuscular re-education;Balance training;Therapeutic exercise;Therapeutic activities;Functional mobility training;Stair training;Gait training;Manual lymph drainage;Passive range of motion   PT Next Visit Plan HEP   Consulted and Agree with Plan of Care Patient      Patient will benefit from skilled therapeutic intervention in order to improve the following  deficits and impairments:  Decreased strength, Difficulty walking, Impaired flexibility, Pain, Decreased coordination, Decreased endurance, Decreased balance, Impaired sensation, Decreased activity tolerance  Visit Diagnosis: Muscle weakness (generalized)     Problem List Patient Active Problem List   Diagnosis Date Noted  . Acquired hypogammaglobulinemia ([place]) 09/10/2015  . Cough 07/18/2015  . Port catheter in place 07/11/2015  . Generalized weakness 05/11/2015  . Benign essential HTN   . PAF (paroxysmal atrial fibrillation) ([place])   . Internal hemorrhoids 02/21/2015  . Rectal bleeding 02/06/2015  . 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 Thrombocytopenia ([place]) 01/29/2015  . AKI (acute kidney injury) ([place])   . Elevated troponin   . Atrial fibrillation with rapid ventricular response ([place]) 01/23/2015  . New onset a-fib ([place]) 01/23/2015  . Hypertension 01/23/2015  . CHF (congestive heart failure) ([place]) 01/21/2015  . Chronic systolic CHF (congestive heart failure), NYHA class 2 ([place]) 01/11/2015  . Fever, unspecified 01/10/2015  . Immunocompromised status associated with infection ([place]) 01/08/2015  . 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 Preventive measure 11/14/2014  . Parotid mass 11/14/2014  . Chronic lower back pain 09/28/2014  . Mediastinal lymphadenopathy 09/27/2014  . Neuropathy due to chemotherapeutic drug ([place]) 06/19/2014  . Peripheral  neuropathy due to chemotherapy ([place]) 04/03/2014  . Trochanteric bursitis of right hip 04/03/2014  . Anemia in chronic illness 02/21/2014  . Elevated liver enzymes 02/21/2014  . Urinary tract infection 11/18/2013  . Candidal intertrigo 09/23/2013  . Atrophic vaginitis 09/23/2013  . Drug-induced neutropenia ([place]) 09/01/2013  . History of colon cancer 06/17/2013  . History of hodgkin's lymphoma 05/20/2013  . Dysuria 05/20/2013  . Conjunctivitis, acute 04/27/2013  . Cold sore 04/27/2013  . Left temporal headache 03/11/2013  . Hyperglycemia 03/11/2013  . Acute maxillary sinusitis 02/09/2013  . Other malaise and fatigue 01/04/2013  . Herpes zoster 11/22/2012  . Vaginal pain 11/22/2012  . Cystocele 09/22/2012  . Encounter for Medicare annual wellness exam 09/17/2012  . Left knee pain 09/10/2012  . Thoracic back pain 06/01/2012  . Skin lesion of face 05/17/2012  . Other screening mammogram 07/22/2011  . Routine gynecological examination 07/22/2011  . Colon cancer screening 07/22/2011  . History of anemia 05/06/2011  . 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 Hypothyroid 02/24/2011  . Varicose veins 02/24/2011  . Elevated blood pressure 11/20/2010  . Obesity 11/20/2010  . 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 Other chronic sinusitis 07/03/2009  . THROAT PAIN, CHRONIC 07/03/2009  . GERD 12/20/2008  . Anxiety state, unspecified 08/22/2008  . MENOPAUSAL SYNDROME 07/13/2007  . HYPERCHOLESTEROLEMIA, PURE 07/08/2006  . Allergic rhinitis 07/08/2006  . OVERACTIVE BLADDER 07/08/2006  . 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 Depression 06/25/2006  . PERIPHERAL VASCULAR DISEASE 06/25/2006  . OSTEOARTHRITIS 06/25/2006  . LEG EDEMA 06/25/2006  . SKIN CANCER, HX OF 06/25/2006  . COLONIC POLYPS, HX OF 06/25/2006   Geraldine Solar, SPT  Geraldine Solar 09/12/2015, 12:06 PM  [place] MAIN [facility] SERVICES [address] [place], [place],  [address] Phone: [phone]   Fax:  [phone]  Name: Diamond Boyd MRN: [id number] Date of Birth: 21-Jan-1941

## 2015-09-13 ENCOUNTER — Telehealth: Payer: Self-pay | Admitting: *Deleted

## 2015-09-13 NOTE — Telephone Encounter (Signed)
Pt reports temp 101.4. Is taking tylenol.  Reviewed with Dr Alvy Bimler. Continue to take tylenol, prednisone. To have IVIG next week, follow up with ENT. Pt just finished course of antibiotics, will follow up with PCP.

## 2015-09-14 ENCOUNTER — Encounter: Payer: Self-pay | Admitting: Family Medicine

## 2015-09-14 ENCOUNTER — Ambulatory Visit (INDEPENDENT_AMBULATORY_CARE_PROVIDER_SITE_OTHER): Payer: Medicare Other | Admitting: Family Medicine

## 2015-09-14 DIAGNOSIS — I2510 Atherosclerotic heart disease of native coronary artery without angina pectoris: Secondary | ICD-10-CM

## 2015-09-14 DIAGNOSIS — J0100 Acute maxillary sinusitis, unspecified: Secondary | ICD-10-CM

## 2015-09-14 MED ORDER — CLINDAMYCIN HCL 300 MG PO CAPS: 300.0000 mg | ORAL_CAPSULE | Freq: Three times a day (TID) | ORAL | 0 refills | Status: DC

## 2015-09-14 NOTE — Progress Notes (Signed)
Prev with mult abx tx, with CT documented sinusitis, with likely unresolved infection related to prev/ongoing cancer treatment.    ENT f/u pending.  She has minimal improvement with azitho, she was some better with clinda treatment in early 08/2015.  Still using nasal saline.    Recently with temp up to 101.4.  Some cough, some sputum, discolored.  Small amount of sputum.  Already on prednisone, '20mg'$  a day.    Meds, vitals, and allergies reviewed.   ROS: Per HPI unless specifically indicated in ROS section   GEN: nad, alert and oriented HEENT: mucous membranes moist, OP wnl, L max and L frontal sinus ttp NECK: supple w/o LA CV: rrr. PULM: ctab, no inc wob ABD: soft, +bs EXT: no edema SKIN: no acute rash

## 2015-09-14 NOTE — Progress Notes (Signed)
Pre visit review using our clinic review tool, if applicable. No additional management support is needed unless otherwise documented below in the visit note. 

## 2015-09-14 NOTE — Patient Instructions (Addendum)
Call the ENT clinic and see if they'll put you on the cancellation list.   Restart clindamycin, along with the prednisone.   Take care.  Glad to see you.  Update [place] as needed.

## 2015-09-16 NOTE — Assessment & Plan Note (Signed)
Prev with mult abx tx, with CT documented sinusitis, with likely unresolved infection related to prev/ongoing cancer treatment.   Restart clinda, continue pred and she'll call about getting into ENT clinic sooner.   She agrees.  Okay for outpatient f/u.  Routed to PCP as FYI.

## 2015-09-17 ENCOUNTER — Ambulatory Visit (HOSPITAL_BASED_OUTPATIENT_CLINIC_OR_DEPARTMENT_OTHER): Payer: Medicare Other

## 2015-09-17 VITALS — BP 141/56 | HR 65 | Temp 99.2°F | Resp 18

## 2015-09-17 DIAGNOSIS — C8228 Follicular lymphoma grade III, unspecified, lymph nodes of multiple sites: Secondary | ICD-10-CM

## 2015-09-17 DIAGNOSIS — D801 Nonfamilial hypogammaglobulinemia: Secondary | ICD-10-CM | POA: Diagnosis not present

## 2015-09-17 MED ORDER — SODIUM CHLORIDE 0.9 % IJ SOLN
10.0000 mL | INTRAMUSCULAR | Status: DC | PRN
  Administered 2015-09-17: 10 mL
  Filled 2015-09-17: qty 10

## 2015-09-17 MED ORDER — IMMUNE GLOBULIN (HUMAN) 10 GM/100ML IV SOLN
110.0000 g | Freq: Once | INTRAVENOUS | Status: AC
  Administered 2015-09-17: 110 g via INTRAVENOUS
  Filled 2015-09-17: qty 1000

## 2015-09-17 MED ORDER — DIPHENHYDRAMINE HCL 25 MG PO TABS
25.0000 mg | ORAL_TABLET | Freq: Four times a day (QID) | ORAL | Status: DC | PRN
  Administered 2015-09-17: 25 mg via ORAL
  Filled 2015-09-17: qty 1

## 2015-09-17 MED ORDER — HEPARIN SOD (PORK) LOCK FLUSH 100 UNIT/ML IV SOLN
500.0000 [IU] | Freq: Once | INTRAVENOUS | Status: AC | PRN
  Administered 2015-09-17: 500 [IU]
  Filled 2015-09-17: qty 5

## 2015-09-17 MED ORDER — DIPHENHYDRAMINE HCL 25 MG PO CAPS
ORAL_CAPSULE | ORAL | Status: AC
  Filled 2015-09-17: qty 1

## 2015-09-17 MED ORDER — ACETAMINOPHEN 325 MG PO TABS
ORAL_TABLET | ORAL | Status: AC
  Filled 2015-09-17: qty 2

## 2015-09-17 MED ORDER — SODIUM CHLORIDE 0.9 % IV SOLN: Freq: Once | INTRAVENOUS | Status: DC

## 2015-09-17 MED ORDER — ACETAMINOPHEN 325 MG PO TABS
650.0000 mg | ORAL_TABLET | Freq: Four times a day (QID) | ORAL | Status: DC | PRN
  Administered 2015-09-17: 650 mg via ORAL

## 2015-09-17 NOTE — Patient Instructions (Signed)
Immune Globulin Injection What is this medicine?  IMMUNE GLOBULIN (im MUNE GLOB yoo lin) helps to prevent or reduce the severity of certain infections in patients who are at risk. This medicine is collected from the pooled blood of many donors. It is used to treat immune system problems, thrombocytopenia, and Kawasaki syndrome. This medicine may be used for other purposes; ask your health care provider or pharmacist if you have questions.  What may interact with this medicine? -aspirin and aspirin-like medicines -cisplatin -cyclosporine -medicines for infection like acyclovir, adefovir, amphotericin B, bacitracin, cidofovir, foscarnet, ganciclovir, gentamicin, pentamidine, vancomycin -NSAIDS, medicines for pain and inflammation, like ibuprofen or naproxen -pamidronate -vaccines -zoledronic acid This list may not describe all possible interactions. Give your health care provider a list of all the medicines, herbs, non-prescription drugs, or dietary supplements you use. Also tell them if you smoke, drink alcohol, or use illegal drugs. Some items may interact with your medicine. What should I watch for while using this medicine? Your condition will be monitored carefully while you are receiving this medicine. This medicine is made from pooled blood donations of many different people. It may be possible to pass an infection in this medicine. However, the donors are screened for infections and all products are tested for HIV and hepatitis. The medicine is treated to kill most or all bacteria and viruses. Talk to your doctor about the risks and benefits of this medicine. Do not have vaccinations for at least 14 days before, or until at least 3 months after receiving this medicine. What side effects may I notice from receiving this medicine? Side effects that you should report to your doctor or health care professional as soon as possible: -allergic reactions like skin rash, itching or hives, swelling  of the face, lips, or tongue -breathing problems -chest pain or tightness -fever, chills -headache with nausea, vomiting -neck pain or difficulty moving neck -pain when moving eyes -pain, swelling, warmth in the leg -problems with balance, talking, walking -sudden weight gain -swelling of the ankles, feet, hands -trouble passing urine or change in the amount of urine Side effects that usually do not require medical attention (report to your doctor or health care professional if they continue or are bothersome): -dizzy, drowsy -flushing -increased sweating -leg cramps -muscle aches and pains -pain at site where injected This list may not describe all possible side effects. Call your doctor for medical advice about side effects. You may report side effects to FDA at 1-800-FDA-1088. Where should I keep my medicine? Keep out of the reach of children. This drug is usually given in a hospital or clinic and will not be stored at home. In rare cases, some brands of this medicine may be given at home. If you are using this medicine at home, you will be instructed on how to store this medicine. Throw away any unused medicine after the expiration date on the label. NOTE: This sheet is a summary. It may not cover all possible information. If you have questions about this medicine, talk to your doctor, pharmacist, or health care provider.    2016, [employer]. (2008-04-19 11:44:49)

## 2015-09-17 NOTE — Progress Notes (Signed)
Patient states that she has been battling with a sinus infection for the last 4 months. She states that she is currently on clindamycin and is scheduled to see a ENT doctor tomorrow. Patient states that she was running a fever of 102 last night and had a headache and chills. No fever this morning, but she said that she took tylenol last at 0700. She states that she is profoundly weak and fatigued.   Dr. Alvy Bimler gives ok to treat today despite fever.

## 2015-09-18 DIAGNOSIS — J320 Chronic maxillary sinusitis: Secondary | ICD-10-CM | POA: Diagnosis not present

## 2015-09-18 DIAGNOSIS — J322 Chronic ethmoidal sinusitis: Secondary | ICD-10-CM | POA: Insufficient documentation

## 2015-09-18 DIAGNOSIS — C8190 Hodgkin lymphoma, unspecified, unspecified site: Secondary | ICD-10-CM | POA: Diagnosis not present

## 2015-09-23 ENCOUNTER — Telehealth: Payer: Self-pay | Admitting: Family Medicine

## 2015-09-23 DIAGNOSIS — E7800 Pure hypercholesterolemia, unspecified: Secondary | ICD-10-CM

## 2015-09-23 DIAGNOSIS — E039 Hypothyroidism, unspecified: Secondary | ICD-10-CM

## 2015-09-23 DIAGNOSIS — I10 Essential (primary) hypertension: Secondary | ICD-10-CM

## 2015-09-23 DIAGNOSIS — R739 Hyperglycemia, unspecified: Secondary | ICD-10-CM

## 2015-09-23 NOTE — Telephone Encounter (Signed)
-----   Message from Ellamae Sia sent at 09/21/2015 10:48 AM EDT ----- Regarding: Lab orders for Friday, 8.18.17 Patient is scheduled for CPX labs, please order future labs, Thanks , Terri  Has some orders in, not sure if you want to add any

## 2015-09-24 ENCOUNTER — Ambulatory Visit: Payer: Medicare Other

## 2015-09-28 ENCOUNTER — Other Ambulatory Visit: Payer: Medicare Other

## 2015-10-03 ENCOUNTER — Ambulatory Visit (INDEPENDENT_AMBULATORY_CARE_PROVIDER_SITE_OTHER): Payer: Medicare Other | Admitting: Allergy & Immunology

## 2015-10-03 ENCOUNTER — Encounter (INDEPENDENT_AMBULATORY_CARE_PROVIDER_SITE_OTHER): Payer: Self-pay

## 2015-10-03 ENCOUNTER — Encounter: Payer: Medicare Other | Admitting: Family Medicine

## 2015-10-03 ENCOUNTER — Encounter: Payer: Self-pay | Admitting: Allergy & Immunology

## 2015-10-03 VITALS — BP 128/80 | HR 84 | Temp 98.9°F | Resp 18 | Ht 64.0 in | Wt 246.4 lb

## 2015-10-03 DIAGNOSIS — R0602 Shortness of breath: Secondary | ICD-10-CM

## 2015-10-03 DIAGNOSIS — T887XXA Unspecified adverse effect of drug or medicament, initial encounter: Secondary | ICD-10-CM | POA: Diagnosis not present

## 2015-10-03 DIAGNOSIS — J310 Chronic rhinitis: Secondary | ICD-10-CM

## 2015-10-03 DIAGNOSIS — T50905A Adverse effect of unspecified drugs, medicaments and biological substances, initial encounter: Secondary | ICD-10-CM

## 2015-10-03 MED ORDER — PENICILLIN V POTASSIUM 500 MG PO TABS: 500.0000 mg | ORAL_TABLET | Freq: Two times a day (BID) | ORAL | 0 refills | Status: DC

## 2015-10-03 NOTE — Progress Notes (Signed)
NEW PATIENT  Date of Service/Encounter:  10/03/15   Assessment:   Chronic rhinitis - Plan: Allergens, Zone 3  Adverse drug effect, initial encounter - Plan: Allergy Test  Shortness of breath - Plan: Spirometry with Graph   History of lymphoma with subsequent acquired hypogammaglobulinemia  History of colon cancer  Multiple other comorbidities   Asthma Reportables:  Severity: : moderate persistent  Risk: high due to comorbidities Control: not well controlled  Seasonal Influenza Vaccine: no but encouraged    Plan/Recommendations:    1. Chronic rhinitis - We are going to get blood work to look for environmental allergies.  - She is on a beta blocker, which makes performing skin prick testing more risky due to lack of response to epinephrine, should it be needed. - In the case of her environmental allergens, the risk vs benefit is not worth taking the risk. - We will call you in one week with the results. - Start Dymista one spray per nostril twice daily (sample provided). - Call us if this helps and we can send in a prescription.  2. Concern for penicillin allergy I did have a long conversation with the patient and her husband about the risks and benefits of skin testing with concurrent use of a beta blocker. - Since there is no good alternative test for penicillin allergy, we decided to proceed. - Testing today was negative, therefore Jenifer is likely NOT allergic to penicillin. - To complete this workup, Oleta will come back and we can give penicillin in the clinic: tomorrow (10/04/15) at 10:15am.   - We will send in a prescription for you to pick up and bring back to you for your next appointment.  3. Shortness of breath - Lung testing showed restriction, which is possibly related to damage from any chemotherapy you may have had. - Albuterol nebulizer did help improve your lung volumes, therefore confirming the diagnosis of asthma. - Continue with ProAir RespiClick  2-4 puffs every 4-6 hours as needed. - Start Symbicort 060/4.5 two puffs twice daily with spacer (sample provided). - Call us if this is helping and we can send in a prescription.  4. Return to clinic tomorrow for penicillin oral challenge. Be sure to pick up your prescription before this! Also make an appointment in one month so we can see how your breathing is going.       Subjective:   SHANORA CHRISTENSEN is a 75 y.o. female presenting today for evaluation of No chief complaint on file. [name] has a history of the following: Patient Active Problem List   Diagnosis Date Noted  . 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 Cough 07/18/2015  . Port catheter in place 07/11/2015  . Generalized weakness 05/11/2015  . Benign essential HTN   . PAF (paroxysmal atrial fibrillation) ([place])   . Internal hemorrhoids 02/21/2015  . Rectal bleeding 02/06/2015  . 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 Thrombocytopenia ([place]) 01/29/2015  . AKI (acute kidney injury) ([place])   . Elevated troponin   . Atrial fibrillation with rapid ventricular response ([place]) 01/23/2015  . New onset a-fib ([place]) 01/23/2015  . Hypertension 01/23/2015  . CHF (congestive heart failure) ([place]) 01/21/2015  . Chronic systolic CHF (congestive heart failure), NYHA class 2 ([place]) 01/11/2015  . Fever, unspecified 01/10/2015  . Immunocompromised status associated with infection ([place]) 01/08/2015  . 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 Follicular lymphoma grade III of lymph  nodes of multiple sites ([place]) 12/11/2014  . Preventive measure 11/14/2014  . Parotid mass 11/14/2014  . Chronic lower back pain 09/28/2014  . Mediastinal lymphadenopathy 09/27/2014  . Neuropathy due to chemotherapeutic drug ([place]) 06/19/2014  . Peripheral neuropathy due to chemotherapy ([place]) 04/03/2014  . Trochanteric bursitis of right hip 04/03/2014  . Anemia in chronic illness  02/21/2014  . Elevated liver enzymes 02/21/2014  . Urinary tract infection 11/18/2013  . Candidal intertrigo 09/23/2013  . Atrophic vaginitis 09/23/2013  . Drug-induced neutropenia ([place]) 09/01/2013  . History of colon cancer 06/17/2013  . History of hodgkin's lymphoma 05/20/2013  . Dysuria 05/20/2013  . Conjunctivitis, acute 04/27/2013  . Cold sore 04/27/2013  . Left temporal headache 03/11/2013  . Hyperglycemia 03/11/2013  . Acute maxillary sinusitis 02/09/2013  . Other malaise and fatigue 01/04/2013  . Herpes zoster 11/22/2012  . Vaginal pain 11/22/2012  . Cystocele 09/22/2012  . Encounter for Medicare annual wellness exam 09/17/2012  . Left knee pain 09/10/2012  . Thoracic back pain 06/01/2012  . Skin lesion of face 05/17/2012  . Other screening mammogram 07/22/2011  . Routine gynecological examination 07/22/2011  . Colon cancer screening 07/22/2011  . History of anemia 05/06/2011  . 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 Hypothyroid 02/24/2011  . Varicose veins 02/24/2011  . Elevated blood pressure 11/20/2010  . Obesity 11/20/2010  . 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 Other chronic sinusitis 07/03/2009  . THROAT PAIN, CHRONIC 07/03/2009  . GERD 12/20/2008  . Anxiety state, unspecified 08/22/2008  . MENOPAUSAL SYNDROME 07/13/2007  . HYPERCHOLESTEROLEMIA, PURE 07/08/2006  . Allergic rhinitis 07/08/2006  . OVERACTIVE BLADDER 07/08/2006  . 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 Depression 06/25/2006  . PERIPHERAL VASCULAR DISEASE 06/25/2006  . OSTEOARTHRITIS 06/25/2006  . LEG EDEMA 06/25/2006  . SKIN CANCER, HX OF 06/25/2006  . COLONIC POLYPS, HX OF 06/25/2006    History obtained from: chart review and patient.  [name] was referred by [name], MD.     PCP: Dr. [name] Oncologist: Dr. [name] ENT: Dr. Melida Quitter [facility]) Cardiologist: Dr. Shelva Majestic Opthamology: Dr. Remo Lipps [place] Cardiothoracic Surgery: Dr.  Irene Shipper General Surgery: Dr. Fanny Skates   Azile is a 75 y.o. female presenting for chronic rhinorrhea and evaluation penicillin allergy. 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 She was initially diagnosed at the age of 68 in the 26s. 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 She continues to be treated with rituximab intermittently and remains on IVIG.   Jorja presents today for "sinus issues" and penicillin allergy. Presents problems but occurring for the past 4 years. She has had constant rhinorrhea with green mucous throughout the day. She has been treated with antibiotics, including two rounds of azithromycin and two rounds of clindamycin. She has been running fevers at night, up to 101.8 or so; she has told her oncologist about this. The fevers have been going on for around three weeks. The antibiotics might help somewhat but she never gets completely better even when she is on the antibiotics. She does endorse a headache that respond to acetaminophen. She does see an ENT physician - Dr. Redmond Baseman. She has been sneezing for a couple of days, otherwise using is not a huge component of her symptoms. She does take Flonase one spray per nostril BID, which does not seem to be helping. In anticipation of this appointment, she has stopped  her Flonase. However, she has not noted a worsening of her symptoms. She is doing nasal saline rinses twice daily which does help "loosen things up" and take it out over time. She did have a sinus CT in July 2017 that demonstrated "Interval development of mild mucosal thickening involving left maxillary and bilateral ethmoid sinuses consistent with mild sinusitis".  she had a sinus culture in August 2017 revealed normal flora.  Jalaina has been seen by Dr. Donneta Romberg in the past. In fact, she was on immunotherapy until around 2 years ago. She estimates that she was on  immunotherapy for 10-12 years total. Her symptoms have seemed worsened since stopping the immunotherapy. She is interested in restarting it.  Domenique does have a persistent cough and she was started on an inhaler which does provide relief. She has baseline shortness of breath. Currently she is using [employer] RespiClick every 5-6 hours. She has never been diagnosed with asthma. The cough is throughout the day and night. She does have reflux treated with Prilosec. Her cough does not appear to be related to meals. The cough does not change supine versus upright.    Khaleah does have a history of penicillin allergy. However, she is unable to elaborate on her symptoms. Instructed to place around 40 years ago. Her husband does not even remember what kind of symptoms she had. She was just told to avoid any antibiotics containing penicillin. She does not recall how she was treated. She does not remember whether she had to go the hospital or not.    Otherwise, there is no history of other atopic diseases, including food allergies, stinging insect allergies, or urticaria. She is on IVIG, therefore she does not receive all of her vaccinations.    Past Medical History: Patient Active Problem List   Diagnosis Date Noted  . 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 Cough 07/18/2015  . Port catheter in place 07/11/2015  . Generalized weakness 05/11/2015  . Benign essential HTN   . PAF (paroxysmal atrial fibrillation) ([place])   . Internal hemorrhoids 02/21/2015  . Rectal bleeding 02/06/2015  . 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 Thrombocytopenia ([place]) 01/29/2015  . AKI (acute kidney injury) ([place])   . Elevated troponin   . Atrial fibrillation with rapid ventricular response ([place]) 01/23/2015  . New onset a-fib ([place]) 01/23/2015  . Hypertension 01/23/2015  . CHF (congestive heart failure) ([place]) 01/21/2015  . Chronic systolic CHF (congestive heart failure), NYHA class 2 ([place]) 01/11/2015  . Fever,  unspecified 01/10/2015  . Immunocompromised status associated with infection ([place]) 01/08/2015  . 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 ([place]) 12/11/2014  . Preventive measure 11/14/2014  . Parotid mass 11/14/2014  . Chronic lower back pain 09/28/2014  . Mediastinal lymphadenopathy 09/27/2014  . Neuropathy due to chemotherapeutic drug ([place]) 06/19/2014  . Peripheral neuropathy due to chemotherapy ([place]) 04/03/2014  . Trochanteric bursitis of right hip 04/03/2014  . Anemia in chronic illness 02/21/2014  . Elevated liver enzymes 02/21/2014  . Urinary tract infection 11/18/2013  . Candidal intertrigo 09/23/2013  . Atrophic vaginitis 09/23/2013  . Drug-induced neutropenia ([place]) 09/01/2013  . History of colon cancer 06/17/2013  . History of hodgkin's lymphoma 05/20/2013  . Dysuria 05/20/2013  . Conjunctivitis, acute 04/27/2013  . Cold sore 04/27/2013  . Left temporal headache 03/11/2013  . Hyperglycemia 03/11/2013  . Acute maxillary sinusitis 02/09/2013  . Other malaise  and fatigue 01/04/2013  . Herpes zoster 11/22/2012  . Vaginal pain 11/22/2012  . Cystocele 09/22/2012  . Encounter for Medicare annual wellness exam 09/17/2012  . Left knee pain 09/10/2012  . Thoracic back pain 06/01/2012  . Skin lesion of face 05/17/2012  . Other screening mammogram 07/22/2011  . Routine gynecological examination 07/22/2011  . Colon cancer screening 07/22/2011  . History of anemia 05/06/2011  . 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 Hypothyroid 02/24/2011  . Varicose veins 02/24/2011  . Elevated blood pressure 11/20/2010  . Obesity 11/20/2010  . 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 Other chronic sinusitis 07/03/2009  . THROAT PAIN, CHRONIC 07/03/2009  . GERD 12/20/2008  . Anxiety state, unspecified 08/22/2008  . MENOPAUSAL SYNDROME 07/13/2007  . HYPERCHOLESTEROLEMIA, PURE  07/08/2006  . Allergic rhinitis 07/08/2006  . OVERACTIVE BLADDER 07/08/2006  . 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 Depression 06/25/2006  . PERIPHERAL VASCULAR DISEASE 06/25/2006  . OSTEOARTHRITIS 06/25/2006  . LEG EDEMA 06/25/2006  . SKIN CANCER, HX OF 06/25/2006  . COLONIC POLYPS, HX OF 06/25/2006    Medication List:    Medication List       Accurate as of 10/03/15 12:32 PM. Always use your most recent med list.          acetaminophen 650 MG CR tablet Commonly known as:  TYLENOL Take 650 mg by mouth every 8 (eight) hours as needed (For back pain.).   acyclovir 400 MG tablet Commonly known as:  ZOVIRAX Take 1 tablet (400 mg total) by mouth daily.   aspirin 81 MG tablet Take 81 mg by mouth daily.   CALCIUM 600+D 600-400 MG-UNIT tablet Generic drug:  Calcium Carbonate-Vitamin D Take 1 tablet by mouth every evening.   celecoxib 200 MG capsule Commonly known as:  CELEBREX Take 1 capsule (200 mg total) by mouth daily as needed for moderate pain.   clindamycin 300 MG capsule Commonly known as:  CLEOCIN Take 1 capsule (300 mg total) by mouth 3 (three) times daily.   docusate sodium 100 MG capsule Commonly known as:  COLACE Take 100 mg by mouth at bedtime.   estradiol 1 MG tablet Commonly known as:  ESTRACE Take 1 tablet (1 mg total) by mouth daily.   fexofenadine 180 MG tablet Commonly known as:  ALLEGRA Take 1 tablet (180 mg total) by mouth daily. Reported on 04/23/2015   FLUoxetine 20 MG capsule Commonly known as:  PROZAC Take 1 capsule (20 mg total) by mouth daily.   fluticasone 50 MCG/ACT nasal spray Commonly known as:  FLONASE Place 2 sprays into both nostrils daily.   furosemide 20 MG tablet Commonly known as:  LASIX Take 1 tablet (20 mg total) by mouth daily. Take 1 tab by mouth daily for weight gain.   gabapentin 300 MG capsule Commonly known as:  NEURONTIN Take 1 capsule (300 mg total) by mouth 2 (two) times daily.   levothyroxine 112 MCG  tablet Commonly known as:  SYNTHROID, LEVOTHROID Take 1 tablet (112 mcg total) by mouth daily before breakfast.   lidocaine-prilocaine cream Commonly known as:  EMLA Apply 1 application topically as needed (For port-a-cath.). Reported on 02/22/2015   losartan 25 MG tablet Commonly known as:  COZAAR Take 1 tablet (25 mg total) by mouth daily.   metoprolol 50 MG tablet Commonly known as:  LOPRESSOR TAKE 1 TABLET (50 MG TOTAL) BY MOUTH 2 (TWO) TIMES DAILY.   Omega-3 350 MG Caps Take 1 capsule by mouth daily. Reported on  02/22/2015   omeprazole 40 MG capsule Commonly known as:  PRILOSEC Take 1 capsule (40 mg total) by mouth daily.   ondansetron 8 MG tablet Commonly known as:  ZOFRAN Take 8 mg by mouth every 6 (six) hours as needed for nausea or vomiting. Reported on 03/05/2015   penicillin v potassium 500 MG tablet Commonly known as:  VEETID Take 1 tablet (500 mg total) by mouth 2 (two) times daily.   polyethylene glycol packet Commonly known as:  MIRALAX / GLYCOLAX Take 17 g by mouth at bedtime.   predniSONE 20 MG tablet Commonly known as:  DELTASONE Take 1 tablet (20 mg total) by mouth daily with breakfast.   PROAIR RESPICLICK 762 (90 Base) MCG/ACT Aepb Generic drug:  Albuterol Sulfate Inhale 1-2 puffs into the lungs every 6 (six) hours as needed.   prochlorperazine 10 MG tablet Commonly known as:  COMPAZINE Take 10 mg by mouth every 6 (six) hours as needed for nausea or vomiting. Reported on 03/05/2015   psyllium 58.6 % packet Commonly known as:  METAMUCIL Take 1-3 packets by mouth at bedtime.   simvastatin 40 MG tablet Commonly known as:  ZOCOR Take 1 tablet by mouth at  bedtime   Vitamin D-3 1000 units Caps Take 1,000 Units by mouth daily.   vitamin E 400 UNIT capsule Take 400 Units by mouth daily. Reported on 03/05/2015       Past Surgical History: Past Surgical History:  Procedure Laterality Date  . BONE MARROW BIOPSY  05/27/13  . BREAST BIOPSY  1996    . CARDIAC CATHETERIZATION N/A 01/25/2015   Procedure: Left Heart Cath and Coronary Angiography;  Surgeon: Peter M Martinique, MD;  Location: [facility];  Service: Cardiovascular;  Laterality: N/A;  . CATARACT EXTRACTION, BILATERAL  2016  . CHOLECYSTECTOMY    . COLECTOMY  8/11  . COLONOSCOPY W/ BIOPSIES    . DG BIOPSY LUNG    . LYMPH NODE BIOPSY N/A 11/01/2014   Procedure: MEDIASTINAL LYMPH NODE BIOPSY;  Surgeon: Grace Isaac, MD;  Location: [facility];  Service: Thoracic;  Laterality: N/A;  . MEDIASTINOSCOPY N/A 11/01/2014   Procedure: MEDIASTINOSCOPY;  Surgeon: Grace Isaac, MD;  Location: [facility];  Service: Thoracic;  Laterality: N/A;  . PLANTAR FASCIA RELEASE    . port-a-cath insertion    . SPLENECTOMY     lymphoma  . TENDON RELEASE     Right thumb   . VAGINAL HYSTERECTOMY    . VENTRAL HERNIA REPAIR  1998  . VIDEO BRONCHOSCOPY WITH ENDOBRONCHIAL ULTRASOUND N/A 10/17/2014   Procedure: VIDEO BRONCHOSCOPY WITH ENDOBRONCHIAL ULTRASOUND;  Surgeon: Javier Glazier, MD;  Location: [facility];  Service: Thoracic;  Laterality: N/A;  . VIDEO BRONCHOSCOPY WITH ENDOBRONCHIAL ULTRASOUND N/A 11/01/2014   Procedure: VIDEO BRONCHOSCOPY WITH ENDOBRONCHIAL ULTRASOUND;  Surgeon: Grace Isaac, MD;  Location: [facility];  Service: Thoracic;  Laterality: N/A;     Family History: Family History  Problem Relation Age of Onset  . Coronary artery disease Mother   . Diabetes Mother   . Fibromyalgia Daughter     chronic pain   . COPD Daughter   . Diabetes Brother   . Asthma Daughter   . Rheum arthritis Brother   . Clotting disorder Brother   . Alcohol abuse Sister   . Colon cancer Neg Hx   . Colon polyps Neg Hx   . Stomach cancer Neg Hx   . Rectal cancer Neg Hx   . Ulcerative colitis  Neg Hx   . Crohn's disease Neg Hx    She does have a sister with allergies as well as a brother. There is no family history of penicillin allergies.   Social History: Patient lives in a house and is 75 years old.  They have hardwood and tile throughout their home. There is electric gas and central cooling. There are dust mite covers on the bed. There is no tobacco exposure. They have no pets. She is retired, but worked as a Furniture conservator/restorer. Her husband worked as a Dealer at [employer].    Review of Systems: a 14-point review of systems is pertinent for what is mentioned in HPI.  Otherwise, all other systems were negative. Constitutional: negative other than that listed in the HPI Eyes: negative other than that listed in the HPI Ears, nose, mouth, throat, and face: negative other than that listed in the HPI Respiratory: negative other than that listed in the HPI Cardiovascular: negative other than that listed in the HPI Gastrointestinal: negative other than that listed in the HPI Genitourinary: negative other than that listed in the HPI Integument: negative other than that listed in the HPI Hematologic: negative other than that listed in the HPI Musculoskeletal: negative other than that listed in the HPI Neurological: negative other than that listed in the HPI Allergy/Immunologic: negative other than that listed in the HPI    Objective:   Blood pressure 128/80, pulse 84, temperature 98.9 F (37.2 C), temperature source Oral, resp. rate 18, height 5' 4"  (1.626 m), weight 246 lb 6.4 oz (111.8 kg), last menstrual period 02/10/1970. Body mass index is 42.29 kg/m.   Physical Exam:  General: Alert, interactive, in no acute distress. Obese female. Very pleasant.  HEENT: TMs pearly gray, turbinates markedly edematous and pale with thick discharge, post-pharynx erythematous. Neck: Supple without thyromegaly. Adenopathy: no enlarged lymph nodes appreciated in the anterior cervical, occipital, axillary, epitrochlear, inguinal, or popliteal regions Lungs: Decreased breath sounds bilaterally without wheezing, rhonchi or rales. No increased work of breathing. CV: Normal S1, S2 without  murmurs. Capillary refill <2 seconds.  Abdomen: Nondistended, nontender. Skin: Warm and dry, without lesions or rashes. Extremities:  No clubbing, cyanosis or edema. Neuro:   Grossly intact.  Diagnostic studies:  Spirometry: results abnormal (FEV1: 1.09/52%, FVC: 1.4/53%, FEV1/FVC: 75%).    Spirometry consistent with possible restrictive disease. An albuterol nebulizer treatment was administered in clinic with marketed improvement. Her FEV1 increased 320 mL (30%). Her FVC increased to 160 mL (18%). Her FEV1 to FVC ratio went from 75% to 83%. She had a 82% increase in FEF25-75%.  Allergy Studies: skin prick testing and intradermal testing was negative to Pre-Pen and Pen G with adequate controls.    Salvatore Marvel, MD [facility]

## 2015-10-03 NOTE — Patient Instructions (Addendum)
1. Chronic rhinitis - We are going to get blood work to look for environmental allergies.  - We will call you in one week with the results. - Start Dymista one spray per nostril twice daily (sample provided). - Call us if this helps and we can send in a prescription.  2. Concern for penicillin allergy - Testing today was negative, therefore you are likely NOT allergic to penicillin. - To complete this workup, we want you to come back and we can give you penicillin in the clinic: tomorrow (10/04/15) at 10:15am.   - We will send in a prescription for you to pick up and bring back to you for your next appointment.  3. Shortness of breath - Lung testing showed restriction, which is possibly related to damage from any chemotherapy you may have had., - Albuterol nebulizer did help improve your lung volumes, therefore you do have asthma. - Continue with ProAir RespiClick 2-4 puffs every 4-6 hours as needed. - Start Symbicort 060/4.5 two puffs twice daily with spacer (sample provided). - Call us if this is helping and we can send in a prescription.  4. Return to clinic tomorrow for penicillin oral challenge. Be sure to pick up your prescription before this! Also make an appointment in one month so we can see how your breathing is going.   It was a pleasure meeting you today!

## 2015-10-04 ENCOUNTER — Ambulatory Visit (INDEPENDENT_AMBULATORY_CARE_PROVIDER_SITE_OTHER): Payer: Medicare Other | Admitting: Allergy & Immunology

## 2015-10-04 ENCOUNTER — Encounter: Payer: Self-pay | Admitting: Allergy & Immunology

## 2015-10-04 VITALS — BP 112/60 | HR 68 | Temp 97.8°F | Resp 28

## 2015-10-04 DIAGNOSIS — T887XXD Unspecified adverse effect of drug or medicament, subsequent encounter: Secondary | ICD-10-CM

## 2015-10-04 DIAGNOSIS — I2510 Atherosclerotic heart disease of native coronary artery without angina pectoris: Secondary | ICD-10-CM | POA: Diagnosis not present

## 2015-10-04 DIAGNOSIS — T50905D Adverse effect of unspecified drugs, medicaments and biological substances, subsequent encounter: Secondary | ICD-10-CM

## 2015-10-04 NOTE — Addendum Note (Signed)
Addended by: Angelica Ran on: 10/04/2015 11:49 AM   Modules accepted: Orders

## 2015-10-04 NOTE — Progress Notes (Signed)
FOLLOW UP  Date of Service/Encounter:  10/04/15   Assessment:   Adverse drug effect, subsequent encounter    Plan/Recommendations:    1. Adverse drug effect, subsequent encounter - You tolerated penicillin today.  - Take one tablet twice daily for the next couple of days.  - We will remove penicillin from your allergy list. - Call us with any concerns or symptoms you may develop over the next week.  2. Return to clinic on 10/31/15 at 11:00am as scheduled.  It was good seeing you again today! Have a great weekend!       Subjective:   Diamond Boyd is a 75 y.o. female presenting today for follow up of  Chief Complaint  Patient presents with  . Food/Drug Challenge    PCN challenge  .  Diamond Boyd has a history of the following: Patient Active Problem List   Diagnosis Date Noted  . 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 Cough 07/18/2015  . Port catheter in place 07/11/2015  . Generalized weakness 05/11/2015  . Benign essential HTN   . PAF (paroxysmal atrial fibrillation) ([place])   . Internal hemorrhoids 02/21/2015  . Rectal bleeding 02/06/2015  . 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 Thrombocytopenia ([place]) 01/29/2015  . AKI (acute kidney injury) ([place])   . Elevated troponin   . Atrial fibrillation with rapid ventricular response ([place]) 01/23/2015  . New onset a-fib ([place]) 01/23/2015  . Hypertension 01/23/2015  . CHF (congestive heart failure) ([place]) 01/21/2015  . Chronic systolic CHF (congestive heart failure), NYHA class 2 ([place]) 01/11/2015  . Fever, unspecified 01/10/2015  . Immunocompromised status associated with infection ([place]) 01/08/2015  . 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 Preventive measure 11/14/2014  . Parotid mass 11/14/2014  . Chronic lower back pain 09/28/2014  . Mediastinal lymphadenopathy  09/27/2014  . Neuropathy due to chemotherapeutic drug ([place]) 06/19/2014  . Peripheral neuropathy due to chemotherapy ([place]) 04/03/2014  . Trochanteric bursitis of right hip 04/03/2014  . Anemia in chronic illness 02/21/2014  . Elevated liver enzymes 02/21/2014  . Urinary tract infection 11/18/2013  . Candidal intertrigo 09/23/2013  . Atrophic vaginitis 09/23/2013  . Drug-induced neutropenia ([place]) 09/01/2013  . History of colon cancer 06/17/2013  . History of hodgkin's lymphoma 05/20/2013  . Dysuria 05/20/2013  . Conjunctivitis, acute 04/27/2013  . Cold sore 04/27/2013  . Left temporal headache 03/11/2013  . Hyperglycemia 03/11/2013  . Acute maxillary sinusitis 02/09/2013  . Other malaise and fatigue 01/04/2013  . Herpes zoster 11/22/2012  . Vaginal pain 11/22/2012  . Cystocele 09/22/2012  . Encounter for Medicare annual wellness exam 09/17/2012  . Left knee pain 09/10/2012  . Thoracic back pain 06/01/2012  . Skin lesion of face 05/17/2012  . Other screening mammogram 07/22/2011  . Routine gynecological examination 07/22/2011  . Colon cancer screening 07/22/2011  . History of anemia 05/06/2011  . 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 Hypothyroid 02/24/2011  . Varicose veins 02/24/2011  . Elevated blood pressure 11/20/2010  . Obesity 11/20/2010  . 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 Other chronic sinusitis 07/03/2009  . THROAT PAIN, CHRONIC 07/03/2009  . GERD 12/20/2008  . Anxiety state, unspecified 08/22/2008  . MENOPAUSAL SYNDROME 07/13/2007  . HYPERCHOLESTEROLEMIA, PURE 07/08/2006  . Allergic rhinitis 07/08/2006  . OVERACTIVE BLADDER 07/08/2006  . 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 Depression 06/25/2006  .  PERIPHERAL VASCULAR DISEASE 06/25/2006  . OSTEOARTHRITIS 06/25/2006  . LEG EDEMA 06/25/2006  . SKIN CANCER, HX OF 06/25/2006  . COLONIC POLYPS, HX OF 06/25/2006    History obtained from: chart review, patient, and patient's husband.  Diamond Boyd was referred by [name], MD.     Diamond Boyd is a 75 y.o. female presenting for a follow up visit for a penicillin challenge. She was seen yesterday and underwent percutaneous and intradermal testing that was negative to penicillin and its components. Therefore she came back today for an oral challenge. See yesterday's note for a more thorough review of her complicated past medical history.  Since the visit yesterday she has done well. She did have some residual bruising secondary to her testing yesterday but otherwise no urticaria, pruritis, or other complaints. She did get her blood testing performed but this has not returned yet. She does think her breathing is improved compared to yesterday.  The patient had negative skin tests to penicillin and was able to tolerate the open graded oral challenge today without adverse signs or symptoms. Therefore, she has the same risk of systemic reaction associated with the consumption of penicillin as the general population.  Open graded oral challenge: The patient was able to tolerate the challenge of penicillin today without adverse signs or symptoms. Vital signs were stable throughout the challenge and observation period. She was first given 25% of the dose (approximately '125mg'$ ). Then she waited 30 minutes before ingesting the remaining 75% of the dose (approximately '375mg'$ ).    There have been no changes to the past medical history, surgical history, family history, or social history.     Review of Systems: a 14-point review of systems is pertinent for what is mentioned in HPI.  Otherwise, all other systems were negative. Constitutional: negative other than that listed in the HPI Eyes: negative other than that listed in the HPI Ears, nose, mouth, throat, and face: negative other than that listed in the HPI Respiratory: negative other than that listed in the HPI Cardiovascular: negative other than that listed in the HPI Gastrointestinal: negative  other than that listed in the HPI Genitourinary: negative other than that listed in the HPI Integument: negative other than that listed in the HPI Hematologic: negative other than that listed in the HPI Musculoskeletal: negative other than that listed in the HPI Neurological: negative other than that listed in the HPI Allergy/Immunologic: negative other than that listed in the HPI    Objective:   Blood pressure 112/60, pulse 68, temperature 97.8 F (36.6 C), temperature source Oral, resp. rate (!) 28, last menstrual period 02/10/1970. There is no height or weight on file to calculate BMI.   See flowsheet for all of the vitals performed during the challenge.   Physical Exam:  General: Alert, interactive, in no acute distress. Very pleasant and smiling female.  HEENT: TMs pearly gray, turbinates minimally edematous without discharge, post-pharynx mildly erythematous. Neck: Supple without thyromegaly. Adenopathy: no enlarged lymph nodes appreciated in the anterior cervical, occipital, axillary, epitrochlear, inguinal, or popliteal regions Lungs: Clear to auscultation without wheezing, rhonchi or rales. no increased work of breathing. Improved aeration compared to yesterday's exam. CV: Normal S1, S2 without murmurs. Capillary refill <2 seconds.  Skin: Warm and dry, without lesions or rashes. No urticaria noted. Some bruising secondary to the skin testing yesterday.   Diagnostic studies: None    Salvatore Marvel, MD [facility]

## 2015-10-04 NOTE — Patient Instructions (Addendum)
1. Adverse drug effect, subsequent encounter - You tolerated penicillin today.  - Take one tablet twice daily for the next couple of days.  - We will remove penicillin from your allergy list. - Call us with any concerns or symptoms you may develop over the next week.  2. Return to clinic on 10/31/15 at 11:00am as scheduled.  It was good seeing you again today! Have a great weekend!

## 2015-10-06 LAB — ALLERGENS, ZONE 3
Alternaria Alternata IgE: <0.1 kU/L
Aspergillus Fumigatus IgE: <0.1 kU/L
Bahia Grass IgE: <0.1 kU/L
Bermuda Grass IgE: <0.1 kU/L
Cedar, Mountain IgE: <0.1 kU/L
Cockroach, American IgE: <0.1 kU/L
Common Silver Birch IgE: <0.1 kU/L
D Farinae IgE: <0.1 kU/L
Hickory, White IgE: <0.1 kU/L
Kentucky Bluegrass IgE: <0.1 kU/L
Maple/Box Elder IgE: <0.1 kU/L
Mucor Racemosus IgE: <0.1 kU/L
Nettle IgE: <0.1 kU/L
Oak, White IgE: <0.1 kU/L
Penicillium Chrysogen IgE: <0.1 kU/L
Plantain, English IgE: <0.1 kU/L
Ragweed, Short IgE: <0.1 kU/L

## 2015-10-11 ENCOUNTER — Encounter: Payer: Medicare Other | Admitting: Allergy & Immunology

## 2015-10-16 ENCOUNTER — Ambulatory Visit (HOSPITAL_BASED_OUTPATIENT_CLINIC_OR_DEPARTMENT_OTHER): Payer: Medicare Other

## 2015-10-16 ENCOUNTER — Encounter: Payer: Self-pay | Admitting: Hematology and Oncology

## 2015-10-16 ENCOUNTER — Ambulatory Visit (HOSPITAL_BASED_OUTPATIENT_CLINIC_OR_DEPARTMENT_OTHER): Payer: Medicare Other | Admitting: Hematology and Oncology

## 2015-10-16 ENCOUNTER — Ambulatory Visit: Payer: Medicare Other

## 2015-10-16 ENCOUNTER — Other Ambulatory Visit (HOSPITAL_BASED_OUTPATIENT_CLINIC_OR_DEPARTMENT_OTHER): Payer: Medicare Other

## 2015-10-16 ENCOUNTER — Telehealth: Payer: Self-pay | Admitting: Hematology and Oncology

## 2015-10-16 ENCOUNTER — Other Ambulatory Visit: Payer: Medicare Other

## 2015-10-16 VITALS — BP 129/67 | HR 62 | Temp 98.5°F | Resp 16

## 2015-10-16 DIAGNOSIS — D801 Nonfamilial hypogammaglobulinemia: Secondary | ICD-10-CM

## 2015-10-16 DIAGNOSIS — R5381 Other malaise: Secondary | ICD-10-CM | POA: Diagnosis not present

## 2015-10-16 DIAGNOSIS — J398 Other specified diseases of upper respiratory tract: Secondary | ICD-10-CM

## 2015-10-16 DIAGNOSIS — C8228 Follicular lymphoma grade III, unspecified, lymph nodes of multiple sites: Secondary | ICD-10-CM

## 2015-10-16 DIAGNOSIS — Z8571 Personal history of Hodgkin lymphoma: Secondary | ICD-10-CM

## 2015-10-16 DIAGNOSIS — J309 Allergic rhinitis, unspecified: Secondary | ICD-10-CM | POA: Diagnosis not present

## 2015-10-16 DIAGNOSIS — I5022 Chronic systolic (congestive) heart failure: Secondary | ICD-10-CM

## 2015-10-16 LAB — CBC WITH DIFFERENTIAL/PLATELET
BASO%: 0.5 % (ref 0.0–2.0)
Basophils Absolute: 0 10*3/uL (ref 0.0–0.1)
EOS%: 0.2 % (ref 0.0–7.0)
Eosinophils Absolute: 0 10*3/uL (ref 0.0–0.5)
HCT: 37.4 % (ref 34.8–46.6)
HEMOGLOBIN: 12.5 g/dL (ref 11.6–15.9)
LYMPH#: 1.8 10*3/uL (ref 0.9–3.3)
LYMPH%: 29.7 % (ref 14.0–49.7)
MCH: 31.9 pg (ref 25.1–34.0)
MCHC: 33.4 g/dL (ref 31.5–36.0)
MCV: 95.4 fL (ref 79.5–101.0)
MONO#: 1.6 10*3/uL — ABNORMAL HIGH (ref 0.1–0.9)
MONO%: 27.4 % — AB (ref 0.0–14.0)
NEUT%: 42.2 % (ref 38.4–76.8)
NEUTROS ABS: 2.5 10*3/uL (ref 1.5–6.5)
NRBC: 2 % — AB (ref 0–0)
Platelets: 213 10*3/uL (ref 145–400)
RBC: 3.92 10*6/uL (ref 3.70–5.45)
RDW: 16.5 % — AB (ref 11.2–14.5)
WBC: 6 10*3/uL (ref 3.9–10.3)

## 2015-10-16 LAB — COMPREHENSIVE METABOLIC PANEL
ALBUMIN: 3.1 g/dL — AB (ref 3.5–5.0)
ALK PHOS: 160 U/L — AB (ref 40–150)
ALT: 20 U/L (ref 0–55)
AST: 32 U/L (ref 5–34)
Anion Gap: 10 mEq/L (ref 3–11)
BILIRUBIN TOTAL: 0.35 mg/dL (ref 0.20–1.20)
BUN: 13.8 mg/dL (ref 7.0–26.0)
CO2: 24 meq/L (ref 22–29)
Calcium: 9.5 mg/dL (ref 8.4–10.4)
Chloride: 105 mEq/L (ref 98–109)
Creatinine: 0.9 mg/dL (ref 0.6–1.1)
EGFR: 62 mL/min/{1.73_m2} — AB (ref >90)
GLUCOSE: 90 mg/dL (ref 70–140)
Potassium: 3.7 mEq/L (ref 3.5–5.1)
SODIUM: 139 meq/L (ref 136–145)
TOTAL PROTEIN: 6.6 g/dL (ref 6.4–8.3)

## 2015-10-16 MED ORDER — HEPARIN SOD (PORK) LOCK FLUSH 100 UNIT/ML IV SOLN
250.0000 [IU] | Freq: Once | INTRAVENOUS | Status: AC | PRN
  Administered 2015-10-16: 250 [IU]
  Filled 2015-10-16: qty 5

## 2015-10-16 MED ORDER — SODIUM CHLORIDE 0.9 % IJ SOLN
3.0000 mL | Freq: Once | INTRAMUSCULAR | Status: AC | PRN
  Administered 2015-10-16: 3 mL via INTRAVENOUS
  Filled 2015-10-16: qty 10

## 2015-10-16 MED ORDER — IMMUNE GLOBULIN (HUMAN) 10 GM/100ML IV SOLN
1.0000 g/kg | Freq: Once | INTRAVENOUS | Status: AC
  Administered 2015-10-16: 110 g via INTRAVENOUS
  Filled 2015-10-16: qty 100

## 2015-10-16 NOTE — Assessment & Plan Note (Signed)
She has no evidence of fluid overload or recent exacerbation of congestive heart failure. She will continue close monitoring and follow-up with cardiologist

## 2015-10-16 NOTE — Assessment & Plan Note (Signed)
Recent PET CT scan from 07/10/2015 show significant improvement compared to prior PET scan. There were nonspecific activity in her bone and the left hilar of indeterminate etiology is. She is not symptomatic. I recommend close observation with repeat imaging study in 6 months, due around November 2017. 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 The patient was getting maintenance rituximab but developed acquired panhypogammaglobulinemia. Her treatment is placed on hold. I will schedule repeat imaging study in November to assess for possible recurrence of disease before placing her back on maintenance treatment

## 2015-10-16 NOTE — Patient Instructions (Signed)

## 2015-10-16 NOTE — Telephone Encounter (Signed)
Appointments complete per 9/5 los. Patient to get print out in infusion area.

## 2015-10-16 NOTE — Progress Notes (Signed)
[facility] OFFICE PROGRESS NOTE  Patient Care Team: Abner Greenspan, MD as PCP - General Mosetta Anis, MD (Allergy) Fanny Skates, MD as Consulting Physician (General Surgery) Grace Isaac, MD as Consulting Physician (Cardiothoracic Surgery) Troy Sine, MD as Consulting Physician (Cardiology) Lonn Georgia, PA-C as Physician Assistant (Cardiology) [name], MD as Consulting Physician (Hematology and Oncology) Estill Cotta, MD as Consulting Physician (Ophthalmology)  SUMMARY OF ONCOLOGIC HISTORY: Oncology History   History of colon cancer   Staging form: Colon and Rectum, AJCC 7th Edition     Clinical: Stage IIA (T3, N0, M0) - Signed by [name], MD on 02/21/2014 History of hodgkin's lymphoma   Staging form: Lymphoid Neoplasms, AJCC 6th Edition     Clinical: Stage IV - Signed by [name], MD on 02/21/2014       History of hodgkin's lymphoma   05/20/2013 Initial Diagnosis    Hodgkin lymphoma      06/27/2014 Imaging    Interval increase in metabolic activity of several small lymph nodes is concerning for lymphoma recurrence. Lymph nodes include a small right level 2 lymph node, right hilar lymph node, and right paratracheal lymph node      07/07/2014 Pathology Results     limited tissue from ultrasound-guided biopsy was nondiagnostic      07/07/2014 Procedure     ultrasound-guided biopsy was nondiagnostic      09/27/2014 Imaging    Repeat PET CT scan show diffuse disease concern for relapse.      10/17/2014 Pathology Results    Accession: [id number] Biopsy was nondiagnostic      10/17/2014 Procedure    She underwent bronchoscopy & EBUS & biopsy of  LN at Level 10L      11/01/2014 Surgery    She underwent cronchoscopy with endobronchial ultrasound, mediastinoscopy and biopsy       11/01/2014 Pathology Results    Accession: [id number] biopsy showed no evidence of lymphoma, only granulomas.      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[place])      12/13/2014 Imaging    PET scan showed diffuse disease      12/28/2014 - 12/30/2014 Hospital Admission    She was admitted to the hospital for cycle one of R-ICE      01/18/2015 - 01/20/2015 Chemotherapy    She was admitted to the hospital for cycle 2 of R-ICE      01/20/2015 - 01/22/2015 Hospital Admission    She was then admitted to [facility] with respiratory failure/fluid overload and was noted to have mild cardiomyopathy      01/23/2015 - 01/27/2015 Hospital Admission    she was admitted to the hospital with new onset atrial fibrillation with rapid ventricular response and was found to have evidence of myocardial ischemia. She was  discharged on medical management and oral anticoagulation therapy      01/25/2015 Procedure    Left heart cath showed non-obstructive lesions: Prox RCA lesion, 40% stenosed.   Prox LAD to Mid LAD lesion, 30% stenosed. Ost LM lesion, 25% stenosed      01/30/2015 Imaging    PET Ct scan showed near complete resolution of all disease      03/12/2015 - 09/10/2015 Chemotherapy    She received 3 doses of Rituximab every other month      07/10/2015 PET scan    PET showed Improved metabolic activity in the bony lesions with mild persistent hilar LN with increased SUV uptake      09/06/2015 Imaging    Ct scan showed interval development of mild mucosal thickening involving left  maxillary and bilateral ethmoid sinuses consistent with mild sinusitis.       INTERVAL HISTORY: Please see below for problem oriented charting. She returns IVIG treatment. She denies recent severe infection. She complained of shortness of breath on minimal exertion. She denies chest pain. The patient denies any recent signs or symptoms of bleeding such as spontaneous epistaxis, hematuria or hematochezia.  REVIEW OF SYSTEMS:   Constitutional: Denies fevers, chills or abnormal weight loss Eyes: Denies blurriness of vision Ears, nose, mouth, throat, and face: Denies mucositis or sore throat Gastrointestinal:  Denies nausea, heartburn or change in bowel habits Skin: Denies abnormal skin rashes Lymphatics: Denies new lymphadenopathy or easy bruising Neurological:Denies numbness, tingling or new weaknesses Behavioral/Psych: Mood is stable, no new changes  All other systems were reviewed with the patient and are negative.  I have reviewed the past medical history, past surgical history, social history and family history with the patient and they are unchanged from previous note.  ALLERGIES:  is allergic to achromycin [tetracycline hcl]; allopurinol; astelin [azelastine hcl]; cephalexin; codeine; lisinopril; meloxicam; minocycline; nabumetone; nyquil [pseudoeph-doxylamine-dm-apap]; sulfa antibiotics; zolpidem tartrate; buspar [buspirone hcl]; and ciprofloxacin.  MEDICATIONS:  Current Outpatient Prescriptions  Medication Sig Dispense Refill  . acetaminophen (TYLENOL) 650 MG CR tablet Take 650 mg by mouth every 8 (eight) hours as needed (For back pain.).    Marland Kitchen acyclovir (ZOVIRAX) 400 MG tablet Take 1 tablet (400 mg total) by mouth daily. 90 tablet 3  . Albuterol Sulfate (PROAIR RESPICLICK) 937 (90 Base) MCG/ACT AEPB Inhale 1-2 puffs into the lungs every 6 (six) hours as needed.    Marland Kitchen amoxicillin-clavulanate (AUGMENTIN) 875-125 MG tablet     . aspirin 81 MG tablet Take 81 mg by mouth daily.    .  Azelastine-Fluticasone (DYMISTA) 137-50 MCG/ACT SUSP Place into the nose.    . budesonide-formoterol (SYMBICORT) 160-4.5 MCG/ACT inhaler Inhale 2 puffs into the lungs 2 (two) times daily.    . Calcium Carbonate-Vitamin D (CALCIUM 600+D) 600-400 MG-UNIT per tablet Take 1 tablet by mouth every evening.     . celecoxib (CELEBREX) 200 MG capsule Take 1 capsule (200 mg total) by mouth daily as needed for moderate pain. 90 capsule 3  . Cholecalciferol (VITAMIN D-3) 1000 UNITS CAPS Take 1,000 Units by mouth daily.    . clindamycin (CLEOCIN) 300 MG capsule Take 1 capsule (300 mg total) by mouth 3 (three) times daily. 21 capsule 0  . docusate sodium (COLACE) 100 MG capsule Take 100 mg by mouth at bedtime.    Marland Kitchen estradiol (ESTRACE) 1 MG tablet Take 1 tablet (1 mg total) by mouth daily. 90 tablet 3  . fexofenadine (ALLEGRA) 180 MG tablet Take 1 tablet (180 mg total)  by mouth daily. Reported on 04/23/2015 90 tablet 3  . FLUoxetine (PROZAC) 20 MG capsule Take 1 capsule (20 mg total) by mouth daily. 90 capsule 3  . furosemide (LASIX) 20 MG tablet Take 1 tablet (20 mg total) by mouth daily. Take 1 tab by mouth daily for weight gain. 90 tablet 3  . gabapentin (NEURONTIN) 300 MG capsule Take 1 capsule (300 mg total) by mouth 2 (two) times daily. 180 capsule 3  . levothyroxine (SYNTHROID, LEVOTHROID) 112 MCG tablet Take 1 tablet (112 mcg total) by mouth daily before breakfast. 90 tablet 3  . lidocaine-prilocaine (EMLA) cream Apply 1 application topically as needed (For port-a-cath.). Reported on 02/22/2015    . losartan (COZAAR) 25 MG tablet Take 1 tablet (25 mg total) by mouth daily. 30 tablet 11  . metoprolol (LOPRESSOR) 50 MG tablet TAKE 1 TABLET (50 MG TOTAL) BY MOUTH 2 (TWO) TIMES DAILY. 180 tablet 3  . Omega-3 350 MG CAPS Take 1 capsule by mouth daily. Reported on 02/22/2015    . omeprazole (PRILOSEC) 40 MG capsule Take 1 capsule (40 mg total) by mouth daily. 90 capsule 3  . penicillin v potassium (VEETID) 500 MG  tablet Take 1 tablet (500 mg total) by mouth 2 (two) times daily. 6 tablet 0  . polyethylene glycol (MIRALAX / GLYCOLAX) packet Take 17 g by mouth at bedtime.    . predniSONE (DELTASONE) 20 MG tablet Take 1 tablet (20 mg total) by mouth daily with breakfast. 10 tablet 0  . psyllium (METAMUCIL) 58.6 % packet Take 1-3 packets by mouth at bedtime.     . simvastatin (ZOCOR) 40 MG tablet Take 1 tablet by mouth at  bedtime 90 tablet 1  . vitamin E (VITAMIN E) 400 UNIT capsule Take 400 Units by mouth daily. Reported on 03/05/2015    . fluticasone (FLONASE) 50 MCG/ACT nasal spray Place 2 sprays into both nostrils daily. (Patient not taking: Reported on 10/16/2015) 48 g 3  . ondansetron (ZOFRAN) 8 MG tablet Take 8 mg by mouth every 6 (six) hours as needed for nausea or vomiting. Reported on 03/05/2015    . prochlorperazine (COMPAZINE) 10 MG tablet Take 10 mg by mouth every 6 (six) hours as needed for nausea or vomiting. Reported on 03/05/2015     No current facility-administered medications for this visit.    Facility-Administered Medications Ordered in Other Visits  Medication Dose Route Frequency Provider Last Rate Last Dose  . heparin lock flush 100 unit/mL  250 Units Intracatheter Once PRN [name], MD      . Immune Globulin 10% (PRIVIGEN) IV infusion 110 g  1 g/kg Intravenous Once [name], MD      . sodium chloride 0.9 % injection 3 mL  3 mL Intravenous Once PRN [name], MD        PHYSICAL EXAMINATION: ECOG PERFORMANCE STATUS: 1 - Symptomatic but completely ambulatory  Vitals:   10/16/15 0800  BP: (!) 107/49  Pulse: 70  Resp: 19  Temp: 98.4 F (36.9 C)   Filed Weights   10/16/15 0800  Weight: 246 lb 6.4 oz (111.8 kg)    GENERAL:alert, no distress and comfortable. She is morbidly obese SKIN: skin color, texture, turgor are normal, no rashes or significant lesions EYES: normal, Conjunctiva are pink and non-injected, sclera clear OROPHARYNX:no exudate, no erythema and lips, buccal  mucosa, and tongue normal  NECK: supple, thyroid normal size, non-tender, without nodularity LYMPH:  no palpable lymphadenopathy in the cervical, axillary or inguinal  LUNGS: clear to auscultation and percussion with normal breathing effort HEART: regular rate & rhythm and no murmurs with chronic, moderate lower extremity edema ABDOMEN:abdomen soft, non-tender and normal bowel sounds Musculoskeletal:no cyanosis of digits and no clubbing  NEURO: alert & oriented x 3 with fluent speech, no focal motor/sensory deficits  LABORATORY DATA:  I have reviewed the data as listed    Component Value Date/Time   NA 139 10/16/2015 0747   K 3.7 10/16/2015 0747   CL 102 04/09/2015 1300   CL 105 07/14/2012 0939   CO2 24 10/16/2015 0747   GLUCOSE 90 10/16/2015 0747   GLUCOSE 103 (H) 07/14/2012 0939   BUN 13.8 10/16/2015 0747   CREATININE 0.9 10/16/2015 0747   CALCIUM 9.5 10/16/2015 0747   PROT 6.6 10/16/2015 0747   ALBUMIN 3.1 (L) 10/16/2015 0747   AST 32 10/16/2015 0747   ALT 20 10/16/2015 0747   ALKPHOS 160 (H) 10/16/2015 0747   BILITOT 0.35 10/16/2015 0747   GFRNONAA 53 (L) 01/27/2015 0625   GFRAA >60 01/27/2015 0625    No results found for: SPEP, UPEP  Lab Results  Component Value Date   WBC 6.0 10/16/2015   NEUTROABS 2.5 10/16/2015   HGB 12.5 10/16/2015   HCT 37.4 10/16/2015   MCV 95.4 10/16/2015   PLT 213 10/16/2015      Chemistry      Component Value Date/Time   NA 139 10/16/2015 0747   K 3.7 10/16/2015 0747   CL 102 04/09/2015 1300   CL 105 07/14/2012 0939   CO2 24 10/16/2015 0747   BUN 13.8 10/16/2015 0747   CREATININE 0.9 10/16/2015 0747      Component Value Date/Time   CALCIUM 9.5 10/16/2015 0747   ALKPHOS 160 (H) 10/16/2015 0747   AST 32 10/16/2015 0747   ALT 20 10/16/2015 0747   BILITOT 0.35 10/16/2015 0747     ASSESSMENT & PLAN:  History of hodgkin's lymphoma Recent PET CT scan from 07/10/2015 show significant improvement compared to prior PET scan. There  were nonspecific activity in her bone and the left hilar of indeterminate etiology is. She is not symptomatic. I recommend close observation with repeat imaging study in 6 months, due around November 2017. 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 The patient was getting maintenance rituximab but developed acquired panhypogammaglobulinemia. Her treatment is placed on hold. I will schedule repeat imaging study in November to assess for possible recurrence of disease before placing her back on maintenance treatment  Chronic systolic CHF (congestive heart failure), NYHA class 2 ([place]) She has no evidence of fluid overload or recent exacerbation of congestive heart failure. 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 I discussed the importance of discontinuation of rituximab and to consider monthly IVIG treatment. The risks, benefit, side effects of IVIG treatment including risk of allergic reaction, hives, itching, serum sickness were discussed and she agreed to proceed. I would dose her at 1 g/kg once every 4 weeks She is doing well so far with no recurrence of infection. I recommend we continue treatment until spring of next year  Allergic rhinitis She has signs and symptoms of allergic rhinitis and recurrent, unresolved upper respiratory tract infection. She has been seen by her primary care doctor, allergist and ENT. CT maxillary sinus shows  sinusitis. She had been on multiple courses of antibiotic therapy  I recommend treatment with IVIG as above and she agreed to proceed  Physical deconditioning She has generalized deconditioning. She had home physical therapist but was not making progress. The patient does not  exercise on her own because she felt fatigue. I encouraged the patient to increase physical activity as tolerated   No orders of the defined types were placed in this encounter.  All questions were answered. The patient knows to call the clinic with any problems, questions or concerns. No barriers to learning was detected. I spent 25 minutes counseling the patient face to face. The total time spent in the appointment was 30 minutes and more than 50% was on counseling and review of test results     [facility], [place], MD 10/16/2015 9:30 AM

## 2015-10-16 NOTE — Assessment & Plan Note (Signed)
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 I discussed the importance of discontinuation of rituximab and to consider monthly IVIG treatment. The risks, benefit, side effects of IVIG treatment including risk of allergic reaction, hives, itching, serum sickness were discussed and she agreed to proceed. I would dose her at 1 g/kg once every 4 weeks She is doing well so far with no recurrence of infection. I recommend we continue treatment until spring of next year

## 2015-10-16 NOTE — Assessment & Plan Note (Signed)
She has generalized deconditioning. She had home physical therapist but was not making progress. The patient does not exercise on her own because she felt fatigue. I encouraged the patient to increase physical activity as tolerated

## 2015-10-16 NOTE — Assessment & Plan Note (Signed)
She has signs and symptoms of allergic rhinitis and recurrent, unresolved upper respiratory tract infection. She has been seen by her primary care doctor, allergist and ENT. CT maxillary sinus shows sinusitis. She had been on multiple courses of antibiotic therapy  I recommend treatment with IVIG as above and she agreed to proceed

## 2015-10-18 ENCOUNTER — Other Ambulatory Visit: Payer: Self-pay | Admitting: Hematology and Oncology

## 2015-10-23 DIAGNOSIS — J309 Allergic rhinitis, unspecified: Secondary | ICD-10-CM | POA: Diagnosis not present

## 2015-10-23 DIAGNOSIS — J300 Vasomotor rhinitis: Secondary | ICD-10-CM | POA: Diagnosis not present

## 2015-10-23 DIAGNOSIS — R05 Cough: Secondary | ICD-10-CM | POA: Diagnosis not present

## 2015-10-24 ENCOUNTER — Other Ambulatory Visit (INDEPENDENT_AMBULATORY_CARE_PROVIDER_SITE_OTHER): Payer: Medicare Other

## 2015-10-24 DIAGNOSIS — E039 Hypothyroidism, unspecified: Secondary | ICD-10-CM

## 2015-10-24 DIAGNOSIS — I10 Essential (primary) hypertension: Secondary | ICD-10-CM

## 2015-10-24 DIAGNOSIS — E7800 Pure hypercholesterolemia, unspecified: Secondary | ICD-10-CM

## 2015-10-24 DIAGNOSIS — R739 Hyperglycemia, unspecified: Secondary | ICD-10-CM

## 2015-10-24 LAB — CBC WITH DIFFERENTIAL/PLATELET
BASOS PCT: 0.4 % (ref 0.0–3.0)
EOS PCT: 0.1 % (ref 0.0–5.0)
HCT: 39.5 % (ref 36.0–46.0)
Hemoglobin: 13.2 g/dL (ref 12.0–15.0)
Lymphocytes Relative: 51.8 % — ABNORMAL HIGH (ref 12.0–46.0)
MCHC: 33.5 g/dL (ref 30.0–36.0)
MCV: 95.9 fl (ref 78.0–100.0)
MONOS PCT: 32.6 % — AB (ref 3.0–12.0)
Neutrophils Relative %: 15.1 % — ABNORMAL LOW (ref 43.0–77.0)
PLATELETS: 109 10*3/uL — AB (ref 150.0–400.0)
RBC: 4.12 Mil/uL (ref 3.87–5.11)
RDW: 17 % — AB (ref 11.5–15.5)
WBC: 8.8 10*3/uL (ref 4.0–10.5)

## 2015-10-24 LAB — COMPREHENSIVE METABOLIC PANEL
ALT: 18 U/L (ref 0–35)
AST: 37 U/L (ref 0–37)
Albumin: 3.5 g/dL (ref 3.5–5.2)
Alkaline Phosphatase: 169 U/L — ABNORMAL HIGH (ref 39–117)
BILIRUBIN TOTAL: 0.5 mg/dL (ref 0.2–1.2)
BUN: 12 mg/dL (ref 6–23)
CHLORIDE: 102 meq/L (ref 96–112)
CO2: 29 meq/L (ref 19–32)
CREATININE: 0.94 mg/dL (ref 0.40–1.20)
Calcium: 9.1 mg/dL (ref 8.4–10.5)
GFR: 61.52 mL/min (ref >60.00)
GLUCOSE: 91 mg/dL (ref 70–99)
Potassium: 3.9 mEq/L (ref 3.5–5.1)
SODIUM: 136 meq/L (ref 135–145)
Total Protein: 7.5 g/dL (ref 6.0–8.3)

## 2015-10-24 LAB — LIPID PANEL
CHOL/HDL RATIO: 10
Cholesterol: 148 mg/dL (ref 0–200)
HDL: 14.9 mg/dL — ABNORMAL LOW (ref >39.00)
NONHDL: 133.52
Triglycerides: 310 mg/dL — ABNORMAL HIGH (ref 0.0–149.0)
VLDL: 62 mg/dL — AB (ref 0.0–40.0)

## 2015-10-24 LAB — LDL CHOLESTEROL, DIRECT: LDL DIRECT: 87 mg/dL

## 2015-10-24 LAB — HEMOGLOBIN A1C: Hgb A1c MFr Bld: 6.4 % (ref 4.6–6.5)

## 2015-10-24 LAB — TSH: TSH: 2.94 u[IU]/mL (ref 0.35–4.50)

## 2015-10-25 DIAGNOSIS — J322 Chronic ethmoidal sinusitis: Secondary | ICD-10-CM | POA: Diagnosis not present

## 2015-10-25 DIAGNOSIS — J320 Chronic maxillary sinusitis: Secondary | ICD-10-CM | POA: Diagnosis not present

## 2015-10-29 ENCOUNTER — Ambulatory Visit (INDEPENDENT_AMBULATORY_CARE_PROVIDER_SITE_OTHER): Payer: Medicare Other | Admitting: Family Medicine

## 2015-10-29 ENCOUNTER — Encounter: Payer: Self-pay | Admitting: Family Medicine

## 2015-10-29 VITALS — BP 110/62 | HR 72 | Temp 98.1°F | Ht 66.0 in | Wt 245.8 lb

## 2015-10-29 DIAGNOSIS — E669 Obesity, unspecified: Secondary | ICD-10-CM

## 2015-10-29 DIAGNOSIS — Z85038 Personal history of other malignant neoplasm of large intestine: Secondary | ICD-10-CM

## 2015-10-29 DIAGNOSIS — I2510 Atherosclerotic heart disease of native coronary artery without angina pectoris: Secondary | ICD-10-CM

## 2015-10-29 DIAGNOSIS — I10 Essential (primary) hypertension: Secondary | ICD-10-CM

## 2015-10-29 DIAGNOSIS — R739 Hyperglycemia, unspecified: Secondary | ICD-10-CM | POA: Diagnosis not present

## 2015-10-29 DIAGNOSIS — E7800 Pure hypercholesterolemia, unspecified: Secondary | ICD-10-CM

## 2015-10-29 DIAGNOSIS — E039 Hypothyroidism, unspecified: Secondary | ICD-10-CM

## 2015-10-29 DIAGNOSIS — G620 Drug-induced polyneuropathy: Secondary | ICD-10-CM

## 2015-10-29 DIAGNOSIS — D696 Thrombocytopenia, unspecified: Secondary | ICD-10-CM

## 2015-10-29 DIAGNOSIS — T451X5A Adverse effect of antineoplastic and immunosuppressive drugs, initial encounter: Secondary | ICD-10-CM

## 2015-10-29 DIAGNOSIS — D801 Nonfamilial hypogammaglobulinemia: Secondary | ICD-10-CM

## 2015-10-29 MED ORDER — FLUTICASONE PROPIONATE 50 MCG/ACT NA SUSP: 2.0000 | Freq: Every day | NASAL | 3 refills | Status: DC

## 2015-10-29 MED ORDER — ACYCLOVIR 400 MG PO TABS: 400.0000 mg | ORAL_TABLET | Freq: Every day | ORAL | 3 refills | Status: AC

## 2015-10-29 MED ORDER — GABAPENTIN 300 MG PO CAPS: 300.0000 mg | ORAL_CAPSULE | Freq: Two times a day (BID) | ORAL | 3 refills | Status: DC

## 2015-10-29 MED ORDER — FUROSEMIDE 20 MG PO TABS: 20.0000 mg | ORAL_TABLET | Freq: Every day | ORAL | 3 refills | Status: DC

## 2015-10-29 MED ORDER — OMEPRAZOLE 40 MG PO CPDR: 40.0000 mg | DELAYED_RELEASE_CAPSULE | Freq: Every day | ORAL | 3 refills | Status: DC

## 2015-10-29 MED ORDER — METOPROLOL TARTRATE 50 MG PO TABS: ORAL_TABLET | ORAL | 3 refills | Status: DC

## 2015-10-29 MED ORDER — LOSARTAN POTASSIUM 25 MG PO TABS: 25.0000 mg | ORAL_TABLET | Freq: Every day | ORAL | 3 refills | Status: DC

## 2015-10-29 MED ORDER — FLUOXETINE HCL 20 MG PO CAPS: 20.0000 mg | ORAL_CAPSULE | Freq: Every day | ORAL | 3 refills | Status: AC

## 2015-10-29 MED ORDER — SIMVASTATIN 40 MG PO TABS: 40.0000 mg | ORAL_TABLET | Freq: Every day | ORAL | 3 refills | Status: DC

## 2015-10-29 MED ORDER — CELECOXIB 200 MG PO CAPS: 200.0000 mg | ORAL_CAPSULE | Freq: Every day | ORAL | 3 refills | Status: DC | PRN

## 2015-10-29 MED ORDER — ESTRADIOL 1 MG PO TABS: 1.0000 mg | ORAL_TABLET | Freq: Every day | ORAL | 3 refills | Status: DC

## 2015-10-29 MED ORDER — LEVOTHYROXINE SODIUM 112 MCG PO TABS: 112.0000 ug | ORAL_TABLET | Freq: Every day | ORAL | 3 refills | Status: AC

## 2015-10-29 NOTE — Patient Instructions (Addendum)
Given your antibody status - I do not know if you could have a flu shot now or not - ask the next time you go to the oncology office  You are also due for a tetanus shot - ask about that also  Continue your calcium and vitamin D for bone health Let us know when you are ready to schedule your next bone density test   Your good cholesterol (HDL) is low - exercise helps this - do try to some exercise as tolerated   Watch sugar and carbohydrates to prevent diabetes = also weight loss will help  No more sweet tea!  - drink water instead - this is like eating sweets all day long - you should loose weight off of it and prevent diabetes    Follow up in  6 month with labs prior   [facility]

## 2015-10-29 NOTE — Assessment & Plan Note (Signed)
Very low HDL due to no activity/exercise Recommend starting slowly with exercise  Also fish/fish oil/omega 3 Continue statin for LDL control  Disc goals for lipids and reasons to control them Rev labs with pt Rev low sat fat diet in detail

## 2015-10-29 NOTE — Assessment & Plan Note (Signed)
Last flex sig rev from 1/17-per pt 3-5 y recall No symptoms

## 2015-10-29 NOTE — Assessment & Plan Note (Signed)
Pt is receiving IGg for this - and has no c/o  Unsure if flu shot or Td would be safe or effective- she will check with her oncologist about that

## 2015-10-29 NOTE — Assessment & Plan Note (Signed)
Lab Results  Component Value Date   HGBA1C 6.4 10/24/2015   Stressed imp of low glycemic diet and wt loss  Enc to stop sweet tea-this may provide a lot of her sugar  Will continue to monitor

## 2015-10-29 NOTE — Progress Notes (Signed)
Subjective:    Patient ID: [name], female    DOB: [birth year], 75 y.o.   MRN: [id number]  HPI Here for f/u of chronic medical problems   Had her AMW visit in March Failed hearing test R ear-? 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  Also recent aquired hypogammaglobulinemia - with IVIG treatment (thinks she has another one in oct and holding off on chemo)  Wt Readings from Last 3 Encounters:  10/29/15 245 lb 12 oz (111.5 kg)  10/16/15 246 lb 6.4 oz (111.8 kg)  10/03/15 246 lb 6.4 oz (111.8 kg)  bmi is 39.6 No exercise - her exercise tolerance is low   Flu shot - holding off for now due to IGG deficit   Is on abx (augmentin) for chronic sinusitis   Tetanus shot 12/07- also needs to ask oncologist about also   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 What the results are  Has too many doctor appointments to schedule at this time No falls or broken bones   Has had a hysterectomy in the past  No gyn problems or symptoms but she wants to be checked  Has hx of cystocele - "bladder has dropped" She has chronic urge and stress incontinence  Not a surgical candidate    bp is stable today  No cp or palpitations or headaches or edema  No side effects to medicines  BP Readings from Last 3 Encounters:  10/29/15 110/62  10/16/15 129/67  10/16/15 (!) 107/49     Hypothyroidism  Pt has no clinical changes No change in energy level/ hair or skin/ edema and no tremor Lab Results  Component Value Date   TSH 2.94 10/24/2015    Hx of hyperlipidemia Lab Results    Component Value Date   CHOL 148 10/24/2015   CHOL 163 01/25/2015   CHOL 160 09/16/2013   Lab Results  Component Value Date   HDL 14.90 (L) 10/24/2015   HDL 44 01/25/2015   HDL 45.20 09/16/2013   Lab Results  Component Value Date   LDLCALC 95 01/25/2015   LDLCALC 85 09/16/2013   LDLCALC 105 (H) 07/18/2009   Lab Results  Component Value Date   TRIG 310.0 (H) 10/24/2015   TRIG 121 01/25/2015   TRIG 147.0 09/16/2013   Lab Results  Component Value Date   CHOLHDL 10 10/24/2015   CHOLHDL 3.7 01/25/2015   CHOLHDL 4 09/16/2013   Lab Results  Component Value Date   LDLDIRECT 87.0 10/24/2015   LDLDIRECT 133.0 09/17/2012   LDLDIRECT 134.0 07/18/2011   Very low HDL - due to no activity or exercise   Hyperglycemia Lab Results  Component Value Date   HGBA1C 6.4 10/24/2015  was on prednisone for a while Up from 5.9-  Will watch  Does not eat a lot of pasta/ bread/potato/rice  Some fruit occ just a bite of sweets  Drinks sweet tea     Alk phos is high-no change since previous  Was  169   Lab Results  Component Value Date   WBC 8.8 10/24/2015   HGB 13.2 10/24/2015   HCT 39.5 10/24/2015   MCV 95.9 10/24/2015   PLT 109.0 (L) 10/24/2015    Patient Active Problem List   Diagnosis Date Noted  . Physical deconditioning 10/16/2015  . Acquired hypogammaglobulinemia ([place]) 09/10/2015  . Cough 07/18/2015  . Port catheter in place 07/11/2015  . Generalized weakness 05/11/2015  . Benign essential HTN   . PAF (paroxysmal atrial fibrillation) ([place])   . Internal hemorrhoids 02/21/2015  . Rectal bleeding 02/06/2015  . 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 Thrombocytopenia ([place]) 01/29/2015  . AKI (acute kidney injury) ([place])   . Elevated troponin   . Atrial fibrillation with rapid ventricular response ([place]) 01/23/2015  . New onset a-fib ([place]) 01/23/2015  . Hypertension 01/23/2015  . CHF (congestive heart failure) ([place]) 01/21/2015  . Chronic systolic CHF (congestive  heart failure), NYHA class 2 ([place]) 01/11/2015  . Fever, unspecified 01/10/2015  . Immunocompromised status associated with infection ([place]) 01/08/2015  . 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 Preventive measure 11/14/2014  . Parotid mass 11/14/2014  . Chronic lower back pain 09/28/2014  . Mediastinal lymphadenopathy 09/27/2014  . Neuropathy due to chemotherapeutic drug ([place]) 06/19/2014  . Peripheral neuropathy due to chemotherapy ([place]) 04/03/2014  . Trochanteric bursitis of right hip 04/03/2014  . Anemia in chronic illness 02/21/2014  . Elevated liver enzymes 02/21/2014  . Urinary tract infection 11/18/2013  . Candidal intertrigo 09/23/2013  . Atrophic vaginitis 09/23/2013  . Drug-induced neutropenia ([place]) 09/01/2013  . History of colon cancer 06/17/2013  . History of hodgkin's lymphoma 05/20/2013  . Conjunctivitis, acute 04/27/2013  . Cold sore 04/27/2013  . Hyperglycemia 03/11/2013  . Acute maxillary sinusitis 02/09/2013  . Other malaise and fatigue 01/04/2013  . Cystocele 09/22/2012  . Encounter for Medicare annual wellness exam 09/17/2012  . Left knee pain 09/10/2012  . Thoracic back pain 06/01/2012  . Skin lesion of face 05/17/2012  . Other screening mammogram 07/22/2011  . Routine gynecological examination 07/22/2011  . Colon cancer screening 07/22/2011  . History of anemia 05/06/2011  . 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 Hypothyroid 02/24/2011  . Varicose veins 02/24/2011  . Obesity 11/20/2010  . 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 Other chronic sinusitis 07/03/2009  . THROAT PAIN, CHRONIC 07/03/2009  . GERD 12/20/2008  . Anxiety state, unspecified 08/22/2008  . MENOPAUSAL SYNDROME 07/13/2007  . HYPERCHOLESTEROLEMIA, PURE 07/08/2006  . Allergic rhinitis 07/08/2006  . OVERACTIVE BLADDER 07/08/2006  . History of B-cell  lymphoma 06/25/2006  . Depression 06/25/2006  . PERIPHERAL VASCULAR DISEASE 06/25/2006  . OSTEOARTHRITIS 06/25/2006  . LEG EDEMA 06/25/2006  . SKIN CANCER, HX OF 06/25/2006  . COLONIC POLYPS, HX OF 06/25/2006   Past Medical History:  Diagnosis Date  . Anemia    hx blood tx  . Benign essential HTN   . Cancer ([place])    skin cancer- basal cell on arm / colon 2011  . Chronic systolic CHF (congestive heart failure), NYHA class 2 ([place]) 01/2015  . Colon cancer ([place])    2011, s/p surgery, in remission  . Colon polyps   . Degenerative disk disease    Thoracic spine  . Depression   . 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 Generalized weakness 05/11/2015  . GERD (gastroesophageal reflux disease)   . Heart murmur   . hodgkins lymphoma   . Hypothyroidism   . Lymphoma ([place])   . Neuropathy due to chemotherapeutic drug ([place]) 06/19/2014  . NICM (nonischemic cardiomyopathy) ([place]) 01/2015  . Osteoarthritis    hands  . 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 PAF (paroxysmal atrial fibrillation) ([place])   . Peripheral vascular disease ([place])   . Pneumonia    Past Surgical History:  Procedure Laterality Date  . BONE MARROW BIOPSY  05/27/13  . BREAST BIOPSY  1996  . CARDIAC CATHETERIZATION N/A 01/25/2015   Procedure: Left Heart Cath and Coronary Angiography;  Surgeon: Peter M Martinique, MD;  Location: [facility];  Service: Cardiovascular;  Laterality: N/A;  . CATARACT EXTRACTION, BILATERAL  2016  . CHOLECYSTECTOMY    . COLECTOMY  8/11  . COLONOSCOPY W/ BIOPSIES    . DG BIOPSY LUNG    . LYMPH NODE BIOPSY N/A 11/01/2014   Procedure: MEDIASTINAL LYMPH NODE BIOPSY;  Surgeon: Grace Isaac, MD;  Location: [facility];  Service: Thoracic;  Laterality: N/A;  . MEDIASTINOSCOPY N/A 11/01/2014   Procedure: MEDIASTINOSCOPY;  Surgeon: Grace Isaac, MD;  Location: [facility];  Service: Thoracic;  Laterality: N/A;  .  PLANTAR FASCIA RELEASE    . port-a-cath insertion    . SPLENECTOMY     lymphoma  . TENDON RELEASE     Right thumb   . VAGINAL HYSTERECTOMY    . VENTRAL HERNIA REPAIR  1998  . VIDEO BRONCHOSCOPY WITH ENDOBRONCHIAL ULTRASOUND N/A 10/17/2014   Procedure: VIDEO BRONCHOSCOPY WITH ENDOBRONCHIAL ULTRASOUND;  Surgeon: Javier Glazier, MD;  Location: [facility];  Service: Thoracic;  Laterality: N/A;  . VIDEO BRONCHOSCOPY WITH ENDOBRONCHIAL ULTRASOUND N/A 11/01/2014   Procedure: VIDEO BRONCHOSCOPY WITH ENDOBRONCHIAL ULTRASOUND;  Surgeon: Grace Isaac, MD;  Location: [facility];  Service: Thoracic;  Laterality: N/A;   Social History  Substance Use Topics  . Smoking status: Never Smoker  . Smokeless tobacco: Never Used     Comment: Second-hand exposure through father.  . Alcohol use No   Family History  Problem Relation Age of Onset  . Coronary artery disease Mother   . Diabetes Mother   . Fibromyalgia Daughter     chronic pain   . COPD Daughter   . Diabetes Brother   . Asthma Daughter   . Rheum arthritis Brother   . Clotting disorder Brother   . Alcohol abuse Sister   . Colon cancer Neg Hx   . Colon polyps Neg Hx   . Stomach cancer Neg Hx   . Rectal cancer Neg Hx   . Ulcerative colitis Neg Hx   . Crohn's disease Neg Hx    Allergies  Allergen Reactions  . Achromycin [Tetracycline Hcl] Other (See Comments)    Pt does not remember reaction  . Allopurinol Other (See Comments)    REACTION: Unsure of reaction happene years ago  . Astelin [Azelastine Hcl] Other (See Comments)    Reaction unknown  . Cephalexin Other (See Comments)    REACTION: unsure of reaction happened yrs ago.  . Codeine Other (See Comments)    REACTION: abd. pain  . Lisinopril Cough  . Meloxicam Other (See Comments)    REACTION: GI symptoms  . Minocycline Other (See Comments)    Abdominal pain  . Nabumetone Other (See Comments)    REACTION: reaction not known  . Nyquil [  Pseudoeph-Doxylamine-Dm-Apap] Hives  .  Sulfa Antibiotics Other (See Comments)    Gi side eff   . Zolpidem Tartrate Other (See Comments)    REACTION: feels too drugged  . Buspar [Buspirone Hcl] Other (See Comments)    Dizziness, and not as effective for anxiety  . Ciprofloxacin Rash   Current Outpatient Prescriptions on File Prior to Visit  Medication Sig Dispense Refill  . acetaminophen (TYLENOL) 650 MG CR tablet Take 650 mg by mouth every 8 (eight) hours as needed (For back pain.).    Marland Kitchen Albuterol Sulfate (PROAIR RESPICLICK) 161 (90 Base) MCG/ACT AEPB Inhale 1-2 puffs into the lungs every 6 (six) hours as needed.    Marland Kitchen amoxicillin-clavulanate (AUGMENTIN) 875-125 MG tablet     . aspirin 81 MG tablet Take 81 mg by mouth daily.    . Azelastine-Fluticasone (DYMISTA) 137-50 MCG/ACT SUSP Place into the nose.    . budesonide-formoterol (SYMBICORT) 160-4.5 MCG/ACT inhaler Inhale 2 puffs into the lungs 2 (two) times daily.    . Calcium Carbonate-Vitamin D (CALCIUM 600+D) 600-400 MG-UNIT per tablet Take 1 tablet by mouth every evening.     . Cholecalciferol (VITAMIN D-3) 1000 UNITS CAPS Take 1,000 Units by mouth daily.    Marland Kitchen docusate sodium (COLACE) 100 MG capsule Take 100 mg by mouth at bedtime.    . lidocaine-prilocaine (EMLA) cream Apply 1 application topically as needed (For port-a-cath.). Reported on 02/22/2015    . Omega-3 350 MG CAPS Take 1 capsule by mouth daily. Reported on 02/22/2015    . ondansetron (ZOFRAN) 8 MG tablet Take 8 mg by mouth every 6 (six) hours as needed for nausea or vomiting. Reported on 03/05/2015    . penicillin v potassium (VEETID) 500 MG tablet Take 1 tablet (500 mg total) by mouth 2 (two) times daily. 6 tablet 0  . polyethylene glycol (MIRALAX / GLYCOLAX) packet Take 17 g by mouth at bedtime.    . prochlorperazine (COMPAZINE) 10 MG tablet Take 10 mg by mouth every 6 (six) hours as needed for nausea or vomiting. Reported on 03/05/2015    . psyllium (METAMUCIL) 58.6 % packet Take 1-3 packets by mouth at bedtime.       . vitamin E (VITAMIN E) 400 UNIT capsule Take 400 Units by mouth daily. Reported on 03/05/2015     No current facility-administered medications on file prior to visit.     Review of Systems    Review of Systems  Constitutional: Negative for fever, appetite change, and unexpected weight change. pos for fatigue and poor exercise tolerance  Eyes: Negative for pain and visual disturbance.  Respiratory: Negative for cough and shortness of breath.   Cardiovascular: Negative for cp or palpitations    Gastrointestinal: Negative for nausea, diarrhea and constipation.  Genitourinary: Negative for urgency and frequency.  Skin: Negative for pallor or rash   Neurological: Negative for weakness, light-headedness, numbness and headaches.  Hematological: Negative for adenopathy. Does not bruise/bleed easily.  Psychiatric/Behavioral: Negative for dysphoric mood. The patient is not nervous/anxious.      Objective:   Physical Exam  Constitutional: She appears well-developed and well-nourished. No distress.  obese and well appearing   HENT:  Head: Normocephalic and atraumatic.  Right Ear: External ear normal.  Left Ear: External ear normal.  Mouth/Throat: Oropharynx is clear and moist.  Eyes: Conjunctivae and EOM are normal. Pupils are equal, round, and reactive to light. No scleral icterus.  Neck: Normal range of motion. Neck supple. No JVD present. Carotid bruit is  not present. No thyromegaly present.  Cardiovascular: Normal rate, regular rhythm, normal heart sounds and intact distal pulses.  Exam reveals no gallop.   Pulmonary/Chest: Effort normal and breath sounds normal. No respiratory distress. She has no wheezes. She exhibits no tenderness.  Abdominal: Soft. Bowel sounds are normal. She exhibits no distension, no abdominal bruit and no mass. There is no tenderness.  Genitourinary: No breast swelling, tenderness, discharge or bleeding.  Genitourinary Comments: Breast exam: No mass, nodules,  thickening, tenderness, bulging, retraction, inflamation, nipple discharge or skin changes noted.  No axillary or clavicular LA.      Bimanual exam- cystocele with urinary leakage noted Nl vaginal mucosa No M noted  No tenderness  Musculoskeletal: Normal range of motion. She exhibits no edema or tenderness.  Lymphadenopathy:    She has no cervical adenopathy.  Neurological: She is alert. She has normal reflexes. No cranial nerve deficit. She exhibits normal muscle tone. Coordination normal.  Skin: Skin is warm and dry. No rash noted. No erythema. No pallor.  Stable brown nevi and sks  Psychiatric: She has a normal mood and affect.          Assessment & Plan:   Problem List Items Addressed This Visit      Cardiovascular and Mediastinum   Hypertension - Primary    bp in fair control at this time  BP Readings from Last 1 Encounters:  10/29/15 110/62   No changes needed Disc lifstyle change with low sodium diet and exercise   Labs reviewed       Relevant Medications   furosemide (LASIX) 20 MG tablet   simvastatin (ZOCOR) 40 MG tablet   metoprolol (LOPRESSOR) 50 MG tablet   losartan (COZAAR) 25 MG tablet     Endocrine   Hypothyroid    Hypothyroidism  Pt has no clinical changes No change in energy level/ hair or skin/ edema and no tremor Lab Results  Component Value Date   TSH 2.94 10/24/2015          Relevant Medications   levothyroxine (SYNTHROID, LEVOTHROID) 112 MCG tablet   metoprolol (LOPRESSOR) 50 MG tablet     Nervous and Auditory   Peripheral neuropathy due to chemotherapy (HCC)    Per pt stable and tolerable at this time Counseled on fall risk      Relevant Medications   FLUoxetine (PROZAC) 20 MG capsule   gabapentin (NEURONTIN) 300 MG capsule     Other   Thrombocytopenia ([place])    Cbc reviewed Has f/u with oncologist planned  Ct is 109 No bleeding or bruising reported      Obesity    Discussed how this problem influences overall health and  the risks it imposes  Reviewed plan for weight loss with lower calorie diet (via better food choices and also portion control or program like weight watchers) and exercise building up to or more than 30 minutes 5 days per week including some aerobic activity   Exercise is limited due to low stamina -enc her to start slowly as tolerated       Hyperglycemia    Lab Results  Component Value Date   HGBA1C 6.4 10/24/2015   Stressed imp of low glycemic diet and wt loss  Enc to stop sweet tea-this may provide a lot of her sugar  Will continue to monitor       HYPERCHOLESTEROLEMIA, PURE    Very low HDL due to no activity/exercise Recommend starting slowly with exercise  Also fish/fish oil/omega  3 Continue statin for LDL control  Disc goals for lipids and reasons to control them Rev labs with pt Rev low sat fat diet in detail       Relevant Medications   furosemide (LASIX) 20 MG tablet   simvastatin (ZOCOR) 40 MG tablet   metoprolol (LOPRESSOR) 50 MG tablet   losartan (COZAAR) 25 MG tablet   History of colon cancer    Last flex sig rev from 1/17-per pt 3-5 y recall No symptoms      Acquired hypogammaglobulinemia (HCC)    Pt is receiving IGg for this - and has no c/o  Unsure if flu shot or Td would be safe or effective- she will check with her oncologist about that        Other Visit Diagnoses   None.

## 2015-10-29 NOTE — Assessment & Plan Note (Signed)
Cbc reviewed Has f/u with oncologist planned  Ct is 109 No bleeding or bruising reported

## 2015-10-29 NOTE — Assessment & Plan Note (Signed)
Per pt stable and tolerable at this time Counseled on fall risk

## 2015-10-29 NOTE — Assessment & Plan Note (Signed)
bp in fair control at this time  BP Readings from Last 1 Encounters:  10/29/15 110/62   No changes needed Disc lifstyle change with low sodium diet and exercise   Labs reviewed

## 2015-10-29 NOTE — Assessment & Plan Note (Signed)
Hypothyroidism  Pt has no clinical changes No change in energy level/ hair or skin/ edema and no tremor Lab Results  Component Value Date   TSH 2.94 10/24/2015

## 2015-10-29 NOTE — Assessment & Plan Note (Signed)
Discussed how this problem influences overall health and the risks it imposes  Reviewed plan for weight loss with lower calorie diet (via better food choices and also portion control or program like weight watchers) and exercise building up to or more than 30 minutes 5 days per week including some aerobic activity   Exercise is limited due to low stamina -enc her to start slowly as tolerated

## 2015-10-29 NOTE — Progress Notes (Signed)
Pre visit review using our clinic review tool, if applicable. No additional management support is needed unless otherwise documented below in the visit note. 

## 2015-10-31 ENCOUNTER — Ambulatory Visit (INDEPENDENT_AMBULATORY_CARE_PROVIDER_SITE_OTHER): Payer: Medicare Other | Admitting: Allergy & Immunology

## 2015-10-31 ENCOUNTER — Encounter: Payer: Self-pay | Admitting: Allergy & Immunology

## 2015-10-31 VITALS — BP 128/62 | HR 62 | Temp 98.6°F | Resp 20 | Ht 64.0 in | Wt 247.0 lb

## 2015-10-31 DIAGNOSIS — J4540 Moderate persistent asthma, uncomplicated: Secondary | ICD-10-CM | POA: Insufficient documentation

## 2015-10-31 DIAGNOSIS — G629 Polyneuropathy, unspecified: Secondary | ICD-10-CM | POA: Insufficient documentation

## 2015-10-31 DIAGNOSIS — I2510 Atherosclerotic heart disease of native coronary artery without angina pectoris: Secondary | ICD-10-CM

## 2015-10-31 DIAGNOSIS — C4491 Basal cell carcinoma of skin, unspecified: Secondary | ICD-10-CM | POA: Insufficient documentation

## 2015-10-31 DIAGNOSIS — Z9849 Cataract extraction status, unspecified eye: Secondary | ICD-10-CM | POA: Insufficient documentation

## 2015-10-31 DIAGNOSIS — J310 Chronic rhinitis: Secondary | ICD-10-CM | POA: Diagnosis not present

## 2015-10-31 DIAGNOSIS — D801 Nonfamilial hypogammaglobulinemia: Secondary | ICD-10-CM

## 2015-10-31 NOTE — Patient Instructions (Signed)
1. Chronic rhinitis - Continue with Dymista one spray per nostril twice daily. - We will send in a prescription for this.   2. Moderate persistent asthma, uncomplicated - Change to [facility] 200/5 two puffs twice daily. - Continue to use the spacer. - Call us if this is working for you and we can send in a prescription. - Lung function was stable today.  3. Return in about 3 months (around 01/30/2016).  Please inform us of any Emergency Department visits, hospitalizations, or changes in symptoms. Call us before going to the ED for breathing or allergy symptoms since we might be able to fit you in for a sick visit. Feel free to contact us anytime with any questions, problems, or concerns.  It was a pleasure to see you and your family again today! Have fun in [place]!

## 2015-10-31 NOTE — Progress Notes (Signed)
FOLLOW UP  Date of Service/Encounter:  10/31/15   Assessment:   Chronic rhinitis  Moderate persistent asthma, uncomplicated  Hypogammaglobulinemia, acquired ([place]) - History of rituximab for chemotherapy   Asthma Reportables:  Severity: moderate persistent  Risk: high due to multiple comorbidities Control: well controlled  Seasonal Influenza Vaccine: no but encouraged     Plan/Recommendations:    1. Chronic rhinitis (non-allergic) - Continue with Dymista one spray per nostril twice daily. - We will send in a prescription for this.  - Patient reports that she felt better on this medication.  2. Moderate persistent asthma, uncomplicated - Change to [facility] 200/5 two puffs twice daily. - Continue to use the spacer. - Patient did have problems with the spacer in the clinic, therefore we provided more instruction.  - Call us if this is working for you and we can send in a prescription. - Lung function was stable today.  3. Acquired hypogammaglobulinemia - Agree with keeping her on IVIG through the winter. - I am willing to help with any immune workup if needed. - If she needs long term replacement, could consider changing to subcutaneous immunoglobulin which can be done at home.   4. Return in about 3 months (around 01/30/2016).   Subjective:   Diamond Boyd is a 75 y.o. female presenting today for follow up of  Chief Complaint  Patient presents with  . Medication Problem    symbicort causing tongue to be sore and affects taste. stopped using x 1 week  .  Diamond Boyd has a history of the following: Patient Active Problem List   Diagnosis Date Noted  . Basal cell carcinoma 10/31/2015  . Neuropathy ([place]) 10/31/2015  . S/P cataract surgery 10/31/2015  . Chronic rhinitis 10/31/2015  . Moderate persistent asthma 10/31/2015  . Physical deconditioning 10/16/2015  . Chronic ethmoidal sinusitis 09/18/2015  . Hypogammaglobulinemia, acquired ([place])  09/10/2015  . Cough 07/18/2015  . Port catheter in place 07/11/2015  . Generalized weakness 05/11/2015  . Benign essential HTN   . PAF (paroxysmal atrial fibrillation) ([place])   . Internal hemorrhoids 02/21/2015  . Rectal bleeding 02/06/2015  . 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 Thrombocytopenia ([place]) 01/29/2015  . AKI (acute kidney injury) ([place])   . Elevated troponin   . Atrial fibrillation with rapid ventricular response ([place]) 01/23/2015  . New onset a-fib ([place]) 01/23/2015  . Hypertension 01/23/2015  . CHF (congestive heart failure) ([place]) 01/21/2015  . Chronic systolic CHF (congestive heart failure), NYHA class 2 ([place]) 01/11/2015  . Fever, unspecified 01/10/2015  . Immunocompromised status associated with infection ([place]) 01/08/2015  . 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 Preventive measure 11/14/2014  . Parotid mass 11/14/2014  . Chronic lower back pain 09/28/2014  . Mediastinal lymphadenopathy 09/27/2014  . Neuropathy due to chemotherapeutic drug ([place]) 06/19/2014  . Peripheral neuropathy due to chemotherapy ([place]) 04/03/2014  . Trochanteric bursitis of right hip 04/03/2014  . Anemia in chronic illness 02/21/2014  . Elevated liver enzymes 02/21/2014  . Urinary tract infection 11/18/2013  . Candidal intertrigo 09/23/2013  . Atrophic vaginitis 09/23/2013  . Drug-induced neutropenia ([place]) 09/01/2013  . History of colon cancer 06/17/2013  . History of hodgkin's lymphoma 05/20/2013  . Conjunctivitis, acute 04/27/2013  . Cold sore 04/27/2013  . Hyperglycemia 03/11/2013  . Hodgkin lymphoma ([place]) 02/10/2013  . Acute maxillary sinusitis 02/09/2013  .  Other malaise and fatigue 01/04/2013  . Shingles 11/22/2012  . Cystocele 09/22/2012  . Encounter for Medicare annual wellness exam 09/17/2012  . Left knee pain 09/10/2012  . Thoracic back pain  06/01/2012  . Skin lesion of face 05/17/2012  . Other screening mammogram 07/22/2011  . Routine gynecological examination 07/22/2011  . Colon cancer screening 07/22/2011  . History of anemia 05/06/2011  . 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 Hypothyroid 02/24/2011  . Varicose veins 02/24/2011  . Obesity 11/20/2010  . Colon cancer ([place]) 08/23/2009  . 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 Chronic maxillary sinusitis 07/03/2009  . THROAT PAIN, CHRONIC 07/03/2009  . S/P colon resection 02/10/2009  . GERD 12/20/2008  . Anxiety state, unspecified 08/22/2008  . MENOPAUSAL SYNDROME 07/13/2007  . High cholesterol 07/08/2006  . Allergic rhinitis 07/08/2006  . OVERACTIVE BLADDER 07/08/2006  . 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 HYPOTHYROIDISM 06/25/2006  . Depression 06/25/2006  . PERIPHERAL VASCULAR DISEASE 06/25/2006  . OSTEOARTHRITIS 06/25/2006  . LEG EDEMA 06/25/2006  . SKIN CANCER, HX OF 06/25/2006  . COLONIC POLYPS, HX OF 06/25/2006     History obtained from: chart review and patient and her husband.  Diamond Boyd was referred by Diamond Pardon, MD.     Diamond Boyd is a 75 y.o. 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 I last saw her in August 2017 where we did penicillin testing. She was negative and proceeded to tolerate her penicillin challenge the following day. She does have a history of hypogammaglobulinemia secondary to rituximab and receives IVIG regularly. At the last visit, we did send environmental allergy testing via serum specific IgE panel that was negative to the entire panel.   Since the last visit, she has done fairly well. However, she does endorse some tongue pain and loss of taste with the use of Symbicort. She stopped taking the Symbicort around one week ago with resolution of the symptoms. She has noted an increase in the use of the ProAir RespiClick since stopping  the Symbicort one week ago. She has had no ED visits or prednisone courses for her breathing. From a respiratory perspective, she did feel that the Symbicort helped. She has also been happy with the [place], which she has since run out of. She would like a prescription for this today.   Ms. Golinski is otherwise doing well. She did see her oncologist Dr. Alvy Bimler last week. She had her last dose of rituximab in July and they are planning to keep her on IVIG through the winter. They will check for return of B cells in the spring and make a decision about immunoglobulin replacement. Otherwise, there have been no changes to the past medical history, surgical history, family history, or social history.     Review of Systems: a 14-point review of systems is pertinent for what is mentioned in HPI.  Otherwise, all other systems were negative. Constitutional: negative other than that listed in the HPI Eyes: negative other than that listed in the HPI Ears, nose, mouth, throat, and face: negative other than that listed in the HPI Respiratory: negative other than that listed in the HPI Cardiovascular: negative other than that listed in the HPI Gastrointestinal: negative other than that listed in the HPI Genitourinary: negative other than that listed in the HPI Integument: negative other than that listed in the HPI Hematologic: negative other than that listed in the HPI Musculoskeletal: negative  other than that listed in the HPI Neurological: negative other than that listed in the HPI Allergy/Immunologic: negative other than that listed in the HPI    Objective:   Blood pressure 128/62, pulse 62, temperature 98.6 F (37 C), temperature source Oral, resp. rate 20, height '5\' 4"'$  (1.626 m), weight 247 lb (112 kg), last menstrual period 02/10/1970. Body mass index is 42.4 kg/m.   Physical Exam:  General: Alert, interactive, in no acute distress. Very pleasant and smiling female. Wearing a bright green  outfit.  HEENT: TMs pearly gray, turbinates edematous with clear discharge, post-pharynx erythematous. Neck: Supple without thyromegaly. Adenopathy: no enlarged lymph nodes appreciated in the anterior cervical, occipital, axillary, epitrochlear, inguinal, or popliteal regions Lungs: Clear to auscultation without wheezing, rhonchi or rales. No increased work of breathing. CV: Normal S1, S2 without murmurs. Capillary refill <2 seconds.  Skin: Warm and dry, without lesions or rashes. Extremities:  No clubbing, cyanosis or edema. Neuro:   Grossly intact.   Diagnostic studies:  Spirometry: results abnormal (FEV1: 1.53/73%, FVC: 1.83/67%, FEV1/FVC: 83%).    Spirometry consistent with possible restrictive disease. Overall, values are improved compared to previous spirometry.  Allergy Studies: None    Salvatore Marvel, MD [facility] [place]

## 2015-11-08 ENCOUNTER — Other Ambulatory Visit: Payer: Self-pay

## 2015-11-08 ENCOUNTER — Telehealth: Payer: Self-pay | Admitting: Allergy & Immunology

## 2015-11-08 MED ORDER — MOMETASONE FURO-FORMOTEROL FUM 200-5 MCG/ACT IN AERO: 2.0000 | INHALATION_SPRAY | Freq: Two times a day (BID) | RESPIRATORY_TRACT | 5 refills | Status: DC

## 2015-11-08 NOTE — Telephone Encounter (Signed)
Patient was last seen by Dr. Ernst Bowler on 10/31/15. She was given a sample of Dulera and would now like the prescription called into [facility] for a 3 month supply.

## 2015-11-08 NOTE — Telephone Encounter (Signed)
Sent in rx.

## 2015-11-13 ENCOUNTER — Ambulatory Visit: Payer: Medicare Other

## 2015-11-13 ENCOUNTER — Ambulatory Visit: Payer: Medicare Other | Admitting: Hematology and Oncology

## 2015-11-13 ENCOUNTER — Other Ambulatory Visit: Payer: Medicare Other

## 2015-11-13 ENCOUNTER — Telehealth: Payer: Self-pay | Admitting: *Deleted

## 2015-11-13 DIAGNOSIS — J320 Chronic maxillary sinusitis: Secondary | ICD-10-CM | POA: Diagnosis not present

## 2015-11-13 DIAGNOSIS — J329 Chronic sinusitis, unspecified: Secondary | ICD-10-CM | POA: Diagnosis not present

## 2015-11-13 NOTE — Telephone Encounter (Signed)
Husband notified to call Dr Ernst Bowler or Dr Redmond Baseman due to the continued sinus infections. Verbalized understanding

## 2015-11-13 NOTE — Telephone Encounter (Signed)
1) I cannot help her if she cannot come in 2) She was seen by allergist immunologist (Dr. Ernst Bowler) just 2 weeks ago. I would recommend she calls their office or Dr. Redmond Baseman office for instructions. She has numerous sinus infections and I felt that they can help her more

## 2015-11-13 NOTE — Telephone Encounter (Signed)
Received call from pt's husband stating that pt will not be able to come today.  He reports that she is nauseated & has a fever of 101.8 & doesn't feel well.  He states that she has green mucous from nose & has a h/a.  He has given her some compazine.  He reports that she has had problems with sinus infections & has been on an ATB but is out now.  Dr Redmond Baseman is her ENT.  She can be reached at [phone].  Message to Dr Robyne Askew

## 2015-11-16 ENCOUNTER — Other Ambulatory Visit: Payer: Self-pay | Admitting: Hematology and Oncology

## 2015-11-16 ENCOUNTER — Telehealth: Payer: Self-pay | Admitting: *Deleted

## 2015-11-16 ENCOUNTER — Telehealth: Payer: Self-pay | Admitting: Hematology and Oncology

## 2015-11-16 NOTE — Telephone Encounter (Signed)
Spoke with patient regarding coming in for IVIG. Is fine coming in for IVIG on Wednesday, 10/11 @ 0830.

## 2015-11-16 NOTE — Telephone Encounter (Signed)
I spoke with Dr. Redmond Baseman from ENT yesterday regarding the patient's care. The patient have recurrent sinus infection. Dr. Redmond Baseman think he can perform surgery to reduce frequency of recurrent sinusitis. I recommend we reschedule her recent IVIG treatment to help reduce risk of severe infection

## 2015-11-20 ENCOUNTER — Telehealth: Payer: Self-pay | Admitting: Allergy & Immunology

## 2015-11-20 NOTE — Telephone Encounter (Signed)
Patient said she went to pick up her [facility] and it was over $300 because they told her it wasn't in her network. She would like to know if there is an alternative medicine that would not cost so much and that is in her network. [facility] is her Pharmacy. 90 day supply. Last saw Dr. Ernst Bowler on 10/31/15.

## 2015-11-20 NOTE — Telephone Encounter (Signed)
PLEASE ADVISE ADVAIR OR BREO

## 2015-11-21 ENCOUNTER — Ambulatory Visit: Payer: Medicare Other

## 2015-11-21 ENCOUNTER — Ambulatory Visit (HOSPITAL_BASED_OUTPATIENT_CLINIC_OR_DEPARTMENT_OTHER): Payer: Medicare Other

## 2015-11-21 VITALS — BP 118/68 | HR 82 | Temp 98.4°F | Resp 18

## 2015-11-21 DIAGNOSIS — Z95828 Presence of other vascular implants and grafts: Secondary | ICD-10-CM

## 2015-11-21 DIAGNOSIS — Z23 Encounter for immunization: Secondary | ICD-10-CM

## 2015-11-21 DIAGNOSIS — D801 Nonfamilial hypogammaglobulinemia: Secondary | ICD-10-CM | POA: Diagnosis present

## 2015-11-21 DIAGNOSIS — C8228 Follicular lymphoma grade III, unspecified, lymph nodes of multiple sites: Secondary | ICD-10-CM

## 2015-11-21 MED ORDER — DIPHENHYDRAMINE HCL 25 MG PO TABS
25.0000 mg | ORAL_TABLET | Freq: Once | ORAL | Status: AC
  Administered 2015-11-21: 25 mg via ORAL
  Filled 2015-11-21: qty 1

## 2015-11-21 MED ORDER — INFLUENZA VAC SPLIT QUAD 0.5 ML IM SUSY
0.5000 mL | PREFILLED_SYRINGE | Freq: Once | INTRAMUSCULAR | Status: AC
  Administered 2015-11-21: 0.5 mL via INTRAMUSCULAR
  Filled 2015-11-21: qty 0.5

## 2015-11-21 MED ORDER — IMMUNE GLOBULIN (HUMAN) 10 GM/100ML IV SOLN
110.0000 g | Freq: Once | INTRAVENOUS | Status: AC
  Administered 2015-11-21: 110 g via INTRAVENOUS
  Filled 2015-11-21: qty 1000

## 2015-11-21 MED ORDER — DIPHENHYDRAMINE HCL 25 MG PO CAPS
ORAL_CAPSULE | ORAL | Status: AC
  Filled 2015-11-21: qty 1

## 2015-11-21 MED ORDER — ACETAMINOPHEN 325 MG PO TABS
650.0000 mg | ORAL_TABLET | Freq: Once | ORAL | Status: AC
  Administered 2015-11-21: 650 mg via ORAL

## 2015-11-21 MED ORDER — FLUTICASONE FUROATE-VILANTEROL 200-25 MCG/INH IN AEPB: 1.0000 | INHALATION_SPRAY | Freq: Every day | RESPIRATORY_TRACT | 6 refills | Status: AC

## 2015-11-21 MED ORDER — SODIUM CHLORIDE 0.9 % IJ SOLN
10.0000 mL | INTRAMUSCULAR | Status: DC | PRN
  Administered 2015-11-21: 10 mL via INTRAVENOUS
  Filled 2015-11-21: qty 10

## 2015-11-21 MED ORDER — ACETAMINOPHEN 325 MG PO TABS
ORAL_TABLET | ORAL | Status: AC
  Filled 2015-11-21: qty 2

## 2015-11-21 MED ORDER — HEPARIN SOD (PORK) LOCK FLUSH 100 UNIT/ML IV SOLN
500.0000 [IU] | Freq: Once | INTRAVENOUS | Status: AC | PRN
  Administered 2015-11-21: 500 [IU] via INTRAVENOUS
  Filled 2015-11-21: qty 5

## 2015-11-21 NOTE — Telephone Encounter (Signed)
Breo 200/25 one puff daily sent in. Please call patient to let her know.   Salvatore Marvel, MD [facility] [place]

## 2015-11-21 NOTE — Patient Instructions (Signed)

## 2015-11-22 ENCOUNTER — Other Ambulatory Visit: Payer: Self-pay

## 2015-11-22 MED ORDER — FLUTICASONE FUROATE-VILANTEROL 200-25 MCG/INH IN AEPB: 1.0000 | INHALATION_SPRAY | Freq: Every day | RESPIRATORY_TRACT | 3 refills | Status: DC

## 2015-11-22 NOTE — Telephone Encounter (Signed)
Spoke with pt and informed her of dr Synthia Innocent change

## 2015-11-22 NOTE — Telephone Encounter (Signed)
Lm for pt to call us back sent in med

## 2015-12-04 ENCOUNTER — Ambulatory Visit: Payer: Medicare Other | Admitting: Family Medicine

## 2015-12-06 DIAGNOSIS — J322 Chronic ethmoidal sinusitis: Secondary | ICD-10-CM | POA: Diagnosis not present

## 2015-12-06 DIAGNOSIS — J320 Chronic maxillary sinusitis: Secondary | ICD-10-CM | POA: Diagnosis not present

## 2015-12-06 DIAGNOSIS — C8190 Hodgkin lymphoma, unspecified, unspecified site: Secondary | ICD-10-CM | POA: Diagnosis not present

## 2015-12-11 ENCOUNTER — Ambulatory Visit (INDEPENDENT_AMBULATORY_CARE_PROVIDER_SITE_OTHER): Payer: Medicare Other | Admitting: Family Medicine

## 2015-12-11 ENCOUNTER — Emergency Department (HOSPITAL_COMMUNITY): Payer: Medicare Other

## 2015-12-11 ENCOUNTER — Encounter (HOSPITAL_COMMUNITY): Payer: Self-pay

## 2015-12-11 ENCOUNTER — Encounter: Payer: Self-pay | Admitting: Family Medicine

## 2015-12-11 ENCOUNTER — Inpatient Hospital Stay (HOSPITAL_COMMUNITY)
Admission: EM | Admit: 2015-12-11 | Discharge: 2015-12-16 | DRG: 872 | Disposition: A | Discharge status: Home / Self Care | Payer: Medicare Other | Attending: Family Medicine | Admitting: Family Medicine

## 2015-12-11 VITALS — BP 118/50 | HR 102 | Temp 99.1°F | Ht 64.0 in | Wt 234.5 lb

## 2015-12-11 DIAGNOSIS — I2510 Atherosclerotic heart disease of native coronary artery without angina pectoris: Secondary | ICD-10-CM

## 2015-12-11 DIAGNOSIS — I951 Orthostatic hypotension: Secondary | ICD-10-CM

## 2015-12-11 DIAGNOSIS — E069 Thyroiditis, unspecified: Secondary | ICD-10-CM | POA: Diagnosis present

## 2015-12-11 DIAGNOSIS — E872 Acidosis, unspecified: Secondary | ICD-10-CM

## 2015-12-11 DIAGNOSIS — R000 Tachycardia, unspecified: Secondary | ICD-10-CM | POA: Diagnosis present

## 2015-12-11 DIAGNOSIS — D801 Nonfamilial hypogammaglobulinemia: Secondary | ICD-10-CM | POA: Diagnosis not present

## 2015-12-11 DIAGNOSIS — Z825 Family history of asthma and other chronic lower respiratory diseases: Secondary | ICD-10-CM

## 2015-12-11 DIAGNOSIS — J320 Chronic maxillary sinusitis: Secondary | ICD-10-CM | POA: Diagnosis not present

## 2015-12-11 DIAGNOSIS — R778 Other specified abnormalities of plasma proteins: Secondary | ICD-10-CM | POA: Diagnosis present

## 2015-12-11 DIAGNOSIS — E7800 Pure hypercholesterolemia, unspecified: Secondary | ICD-10-CM | POA: Diagnosis present

## 2015-12-11 DIAGNOSIS — K529 Noninfective gastroenteritis and colitis, unspecified: Secondary | ICD-10-CM | POA: Diagnosis present

## 2015-12-11 DIAGNOSIS — I429 Cardiomyopathy, unspecified: Secondary | ICD-10-CM | POA: Diagnosis present

## 2015-12-11 DIAGNOSIS — Z86010 Personal history of colonic polyps: Secondary | ICD-10-CM

## 2015-12-11 DIAGNOSIS — R509 Fever, unspecified: Secondary | ICD-10-CM

## 2015-12-11 DIAGNOSIS — Z882 Allergy status to sulfonamides status: Secondary | ICD-10-CM

## 2015-12-11 DIAGNOSIS — D630 Anemia in neoplastic disease: Secondary | ICD-10-CM | POA: Diagnosis present

## 2015-12-11 DIAGNOSIS — B9689 Other specified bacterial agents as the cause of diseases classified elsewhere: Secondary | ICD-10-CM | POA: Diagnosis present

## 2015-12-11 DIAGNOSIS — I509 Heart failure, unspecified: Secondary | ICD-10-CM

## 2015-12-11 DIAGNOSIS — D696 Thrombocytopenia, unspecified: Secondary | ICD-10-CM | POA: Diagnosis present

## 2015-12-11 DIAGNOSIS — Z6839 Body mass index (BMI) 39.0-39.9, adult: Secondary | ICD-10-CM

## 2015-12-11 DIAGNOSIS — A419 Sepsis, unspecified organism: Principal | ICD-10-CM

## 2015-12-11 DIAGNOSIS — C8190 Hodgkin lymphoma, unspecified, unspecified site: Secondary | ICD-10-CM | POA: Diagnosis not present

## 2015-12-11 DIAGNOSIS — K6289 Other specified diseases of anus and rectum: Secondary | ICD-10-CM | POA: Diagnosis not present

## 2015-12-11 DIAGNOSIS — I110 Hypertensive heart disease with heart failure: Secondary | ICD-10-CM | POA: Diagnosis present

## 2015-12-11 DIAGNOSIS — K219 Gastro-esophageal reflux disease without esophagitis: Secondary | ICD-10-CM | POA: Diagnosis present

## 2015-12-11 DIAGNOSIS — I5022 Chronic systolic (congestive) heart failure: Secondary | ICD-10-CM | POA: Diagnosis present

## 2015-12-11 DIAGNOSIS — I10 Essential (primary) hypertension: Secondary | ICD-10-CM | POA: Diagnosis not present

## 2015-12-11 DIAGNOSIS — Z85038 Personal history of other malignant neoplasm of large intestine: Secondary | ICD-10-CM

## 2015-12-11 DIAGNOSIS — J449 Chronic obstructive pulmonary disease, unspecified: Secondary | ICD-10-CM | POA: Diagnosis present

## 2015-12-11 DIAGNOSIS — Z6841 Body Mass Index (BMI) 40.0 and over, adult: Secondary | ICD-10-CM

## 2015-12-11 DIAGNOSIS — R0602 Shortness of breath: Secondary | ICD-10-CM | POA: Diagnosis not present

## 2015-12-11 DIAGNOSIS — Z888 Allergy status to other drugs, medicaments and biological substances status: Secondary | ICD-10-CM

## 2015-12-11 DIAGNOSIS — J0100 Acute maxillary sinusitis, unspecified: Secondary | ICD-10-CM | POA: Diagnosis present

## 2015-12-11 DIAGNOSIS — I739 Peripheral vascular disease, unspecified: Secondary | ICD-10-CM | POA: Diagnosis present

## 2015-12-11 DIAGNOSIS — Z8249 Family history of ischemic heart disease and other diseases of the circulatory system: Secondary | ICD-10-CM

## 2015-12-11 DIAGNOSIS — Z9081 Acquired absence of spleen: Secondary | ICD-10-CM

## 2015-12-11 DIAGNOSIS — E860 Dehydration: Secondary | ICD-10-CM | POA: Diagnosis present

## 2015-12-11 DIAGNOSIS — J322 Chronic ethmoidal sinusitis: Secondary | ICD-10-CM | POA: Diagnosis present

## 2015-12-11 DIAGNOSIS — R740 Nonspecific elevation of levels of transaminase and lactic acid dehydrogenase [LDH]: Secondary | ICD-10-CM | POA: Diagnosis present

## 2015-12-11 DIAGNOSIS — Z881 Allergy status to other antibiotic agents status: Secondary | ICD-10-CM

## 2015-12-11 DIAGNOSIS — I480 Paroxysmal atrial fibrillation: Secondary | ICD-10-CM | POA: Diagnosis present

## 2015-12-11 DIAGNOSIS — E039 Hypothyroidism, unspecified: Secondary | ICD-10-CM | POA: Diagnosis not present

## 2015-12-11 DIAGNOSIS — E785 Hyperlipidemia, unspecified: Secondary | ICD-10-CM | POA: Diagnosis present

## 2015-12-11 DIAGNOSIS — E669 Obesity, unspecified: Secondary | ICD-10-CM | POA: Diagnosis present

## 2015-12-11 DIAGNOSIS — K648 Other hemorrhoids: Secondary | ICD-10-CM | POA: Diagnosis not present

## 2015-12-11 DIAGNOSIS — E440 Moderate protein-calorie malnutrition: Secondary | ICD-10-CM | POA: Diagnosis present

## 2015-12-11 DIAGNOSIS — R531 Weakness: Secondary | ICD-10-CM | POA: Diagnosis not present

## 2015-12-11 DIAGNOSIS — Z79899 Other long term (current) drug therapy: Secondary | ICD-10-CM

## 2015-12-11 DIAGNOSIS — R748 Abnormal levels of other serum enzymes: Secondary | ICD-10-CM | POA: Diagnosis not present

## 2015-12-11 DIAGNOSIS — K3189 Other diseases of stomach and duodenum: Secondary | ICD-10-CM | POA: Diagnosis not present

## 2015-12-11 DIAGNOSIS — A0472 Enterocolitis due to Clostridium difficile, not specified as recurrent: Secondary | ICD-10-CM

## 2015-12-11 DIAGNOSIS — Z85828 Personal history of other malignant neoplasm of skin: Secondary | ICD-10-CM

## 2015-12-11 DIAGNOSIS — R7989 Other specified abnormal findings of blood chemistry: Secondary | ICD-10-CM | POA: Diagnosis present

## 2015-12-11 DIAGNOSIS — I5032 Chronic diastolic (congestive) heart failure: Secondary | ICD-10-CM | POA: Diagnosis not present

## 2015-12-11 DIAGNOSIS — Z7951 Long term (current) use of inhaled steroids: Secondary | ICD-10-CM

## 2015-12-11 LAB — URINE MICROSCOPIC-ADD ON

## 2015-12-11 LAB — URINALYSIS, ROUTINE W REFLEX MICROSCOPIC
Bilirubin Urine: NEGATIVE
Glucose, UA: NEGATIVE mg/dL
Hgb urine dipstick: NEGATIVE
Ketones, ur: NEGATIVE mg/dL
NITRITE: NEGATIVE
PH: 6.5 (ref 5.0–8.0)
Protein, ur: 30 mg/dL — AB
SPECIFIC GRAVITY, URINE: 1.024 (ref 1.005–1.030)

## 2015-12-11 LAB — CBC WITH DIFFERENTIAL/PLATELET
BAND NEUTROPHILS: 0 %
BASOS ABS: 0 10*3/uL (ref 0.0–0.1)
BLASTS: 0 %
Basophils Relative: 0 %
EOS ABS: 0 10*3/uL (ref 0.0–0.7)
Eosinophils Relative: 0 %
HEMATOCRIT: 37.2 % (ref 36.0–46.0)
Hemoglobin: 12.9 g/dL (ref 12.0–15.0)
LYMPHS ABS: 5.4 10*3/uL — AB (ref 0.7–4.0)
Lymphocytes Relative: 53 %
MCH: 30.9 pg (ref 26.0–34.0)
MCHC: 34.7 g/dL (ref 30.0–36.0)
MCV: 89.2 fL (ref 78.0–100.0)
METAMYELOCYTES PCT: 0 %
MONOS PCT: 26 %
MYELOCYTES: 0 %
Monocytes Absolute: 2.6 10*3/uL — ABNORMAL HIGH (ref 0.1–1.0)
NEUTROS ABS: 2.1 10*3/uL (ref 1.7–7.7)
Neutrophils Relative %: 21 %
Other: 0 %
PLATELETS: 25 10*3/uL — AB (ref 150–400)
Promyelocytes Absolute: 0 %
RBC: 4.17 MIL/uL (ref 3.87–5.11)
RDW: 18.1 % — AB (ref 11.5–15.5)
WBC: 10.1 10*3/uL (ref 4.0–10.5)
nRBC: 3 /100 WBC — ABNORMAL HIGH

## 2015-12-11 LAB — COMPREHENSIVE METABOLIC PANEL
ALBUMIN: 3.2 g/dL — AB (ref 3.5–5.0)
ALT: 29 U/L (ref 14–54)
AST: 67 U/L — AB (ref 15–41)
Alkaline Phosphatase: 243 U/L — ABNORMAL HIGH (ref 38–126)
Anion gap: 8 (ref 5–15)
BUN: 18 mg/dL (ref 6–20)
CHLORIDE: 100 mmol/L — AB (ref 101–111)
CO2: 24 mmol/L (ref 22–32)
CREATININE: 1.02 mg/dL — AB (ref 0.44–1.00)
Calcium: 9.4 mg/dL (ref 8.9–10.3)
GFR calc Af Amer: >60 mL/min (ref >60)
GFR calc non Af Amer: 52 mL/min — ABNORMAL LOW (ref >60)
GLUCOSE: 100 mg/dL — AB (ref 65–99)
POTASSIUM: 3.9 mmol/L (ref 3.5–5.1)
Sodium: 132 mmol/L — ABNORMAL LOW (ref 135–145)
Total Bilirubin: 1.1 mg/dL (ref 0.3–1.2)
Total Protein: 6.2 g/dL — ABNORMAL LOW (ref 6.5–8.1)

## 2015-12-11 LAB — I-STAT CG4 LACTIC ACID, ED: Lactic Acid, Venous: 3.32 mmol/L (ref 0.5–1.9)

## 2015-12-11 LAB — TROPONIN I
Troponin I: 0.04 ng/mL (ref <0.03)
Troponin I: 0.04 ng/mL (ref <0.03)
Troponin I: 0.04 ng/mL (ref <0.03)

## 2015-12-11 LAB — LACTIC ACID, PLASMA
LACTIC ACID, VENOUS: 2.2 mmol/L — AB (ref 0.5–1.9)
Lactic Acid, Venous: 2.1 mmol/L (ref 0.5–1.9)

## 2015-12-11 LAB — PROCALCITONIN: PROCALCITONIN: 0.28 ng/mL

## 2015-12-11 MED ORDER — ENSURE ENLIVE PO LIQD
237.0000 mL | Freq: Two times a day (BID) | ORAL | Status: DC
  Administered 2015-12-11 – 2015-12-16 (×6): 237 mL via ORAL

## 2015-12-11 MED ORDER — ACETAMINOPHEN 500 MG PO TABS
1000.0000 mg | ORAL_TABLET | Freq: Once | ORAL | Status: AC
  Administered 2015-12-11: 1000 mg via ORAL
  Filled 2015-12-11: qty 2

## 2015-12-11 MED ORDER — FLUTICASONE PROPIONATE 50 MCG/ACT NA SUSP
2.0000 | Freq: Every day | NASAL | Status: DC
  Administered 2015-12-11 – 2015-12-16 (×6): 2 via NASAL
  Filled 2015-12-11: qty 16

## 2015-12-11 MED ORDER — SODIUM CHLORIDE 0.9 % IV SOLN: INTRAVENOUS | Status: DC

## 2015-12-11 MED ORDER — SODIUM CHLORIDE 0.9 % IV BOLUS (SEPSIS)
250.0000 mL | Freq: Once | INTRAVENOUS | Status: AC
  Administered 2015-12-11: 250 mL via INTRAVENOUS

## 2015-12-11 MED ORDER — SODIUM CHLORIDE 0.9% FLUSH
10.0000 mL | INTRAVENOUS | Status: DC | PRN
  Administered 2015-12-13: 20 mL
  Administered 2015-12-15 – 2015-12-16 (×3): 10 mL
  Filled 2015-12-11 (×4): qty 40

## 2015-12-11 MED ORDER — METOPROLOL TARTRATE 25 MG PO TABS
25.0000 mg | ORAL_TABLET | Freq: Two times a day (BID) | ORAL | Status: DC
  Administered 2015-12-11 – 2015-12-16 (×10): 25 mg via ORAL
  Filled 2015-12-11 (×10): qty 1

## 2015-12-11 MED ORDER — METRONIDAZOLE IN NACL 5-0.79 MG/ML-% IV SOLN: 500.0000 mg | Freq: Once | INTRAVENOUS | Status: DC

## 2015-12-11 MED ORDER — LIDOCAINE 5 % EX OINT: 1.0000 "application " | TOPICAL_OINTMENT | Freq: Three times a day (TID) | CUTANEOUS | 1 refills | Status: DC | PRN

## 2015-12-11 MED ORDER — ONDANSETRON HCL 4 MG/2ML IJ SOLN: 4.0000 mg | Freq: Four times a day (QID) | INTRAMUSCULAR | Status: DC | PRN

## 2015-12-11 MED ORDER — ACETAMINOPHEN 325 MG PO TABS
650.0000 mg | ORAL_TABLET | Freq: Four times a day (QID) | ORAL | Status: DC | PRN
  Administered 2015-12-11 – 2015-12-16 (×7): 650 mg via ORAL
  Filled 2015-12-11 (×7): qty 2

## 2015-12-11 MED ORDER — PIPERACILLIN-TAZOBACTAM 3.375 G IVPB 30 MIN
3.3750 g | Freq: Once | INTRAVENOUS | Status: AC
Start: 2015-12-11 — End: 2015-12-11
  Administered 2015-12-11: 3.375 g via INTRAVENOUS
  Filled 2015-12-11 (×2): qty 50

## 2015-12-11 MED ORDER — LORATADINE 10 MG PO TABS
10.0000 mg | ORAL_TABLET | Freq: Every day | ORAL | Status: DC
Start: 2015-12-11 — End: 2015-12-16
  Administered 2015-12-11 – 2015-12-16 (×6): 10 mg via ORAL
  Filled 2015-12-11 (×6): qty 1

## 2015-12-11 MED ORDER — ESTRADIOL 1 MG PO TABS
1.0000 mg | ORAL_TABLET | Freq: Every day | ORAL | Status: DC
  Administered 2015-12-11 – 2015-12-16 (×6): 1 mg via ORAL
  Filled 2015-12-11 (×6): qty 1

## 2015-12-11 MED ORDER — VANCOMYCIN 50 MG/ML ORAL SOLUTION
125.0000 mg | Freq: Four times a day (QID) | ORAL | Status: DC
  Administered 2015-12-11 – 2015-12-14 (×12): 125 mg via ORAL
  Filled 2015-12-11 (×14): qty 2.5

## 2015-12-11 MED ORDER — FLUTICASONE FUROATE-VILANTEROL 200-25 MCG/INH IN AEPB
1.0000 | INHALATION_SPRAY | Freq: Every day | RESPIRATORY_TRACT | Status: DC
  Administered 2015-12-12 – 2015-12-16 (×4): 1 via RESPIRATORY_TRACT
  Filled 2015-12-11: qty 28

## 2015-12-11 MED ORDER — SIMVASTATIN 40 MG PO TABS
40.0000 mg | ORAL_TABLET | Freq: Every day | ORAL | Status: DC
  Administered 2015-12-11 – 2015-12-15 (×5): 40 mg via ORAL
  Filled 2015-12-11 (×5): qty 1

## 2015-12-11 MED ORDER — PANTOPRAZOLE SODIUM 40 MG PO TBEC
40.0000 mg | DELAYED_RELEASE_TABLET | Freq: Every day | ORAL | Status: DC
  Administered 2015-12-11 – 2015-12-12 (×2): 40 mg via ORAL
  Filled 2015-12-11 (×2): qty 1

## 2015-12-11 MED ORDER — SODIUM CHLORIDE 0.9 % IV BOLUS (SEPSIS)
1000.0000 mL | Freq: Once | INTRAVENOUS | Status: AC
  Administered 2015-12-11: 1000 mL via INTRAVENOUS

## 2015-12-11 MED ORDER — PROCHLORPERAZINE MALEATE 10 MG PO TABS: 10.0000 mg | ORAL_TABLET | Freq: Four times a day (QID) | ORAL | Status: DC | PRN

## 2015-12-11 MED ORDER — GABAPENTIN 300 MG PO CAPS
300.0000 mg | ORAL_CAPSULE | Freq: Two times a day (BID) | ORAL | Status: DC
Start: 2015-12-11 — End: 2015-12-16
  Administered 2015-12-11 – 2015-12-16 (×10): 300 mg via ORAL
  Filled 2015-12-11 (×10): qty 1

## 2015-12-11 MED ORDER — VITAMIN E 180 MG (400 UNIT) PO CAPS
400.0000 [IU] | ORAL_CAPSULE | Freq: Every day | ORAL | Status: DC
  Administered 2015-12-11 – 2015-12-16 (×6): 400 [IU] via ORAL
  Filled 2015-12-11 (×6): qty 1

## 2015-12-11 MED ORDER — ASPIRIN 81 MG PO CHEW
81.0000 mg | CHEWABLE_TABLET | Freq: Every day | ORAL | Status: DC
  Administered 2015-12-11 – 2015-12-16 (×6): 81 mg via ORAL
  Filled 2015-12-11 (×6): qty 1

## 2015-12-11 MED ORDER — ONDANSETRON HCL 4 MG PO TABS: 4.0000 mg | ORAL_TABLET | Freq: Four times a day (QID) | ORAL | Status: DC | PRN

## 2015-12-11 MED ORDER — SODIUM CHLORIDE 0.9 % IV SOLN
INTRAVENOUS | Status: DC
Start: 2015-12-11 — End: 2015-12-16
  Administered 2015-12-11 – 2015-12-12 (×2): via INTRAVENOUS

## 2015-12-11 MED ORDER — CALCIUM CARBONATE-VITAMIN D 500-200 MG-UNIT PO TABS
1.0000 | ORAL_TABLET | Freq: Every evening | ORAL | Status: DC
  Administered 2015-12-11 – 2015-12-15 (×5): 1 via ORAL
  Filled 2015-12-11 (×5): qty 1

## 2015-12-11 MED ORDER — ACYCLOVIR 400 MG PO TABS
400.0000 mg | ORAL_TABLET | Freq: Every day | ORAL | Status: DC
  Administered 2015-12-11 – 2015-12-16 (×6): 400 mg via ORAL
  Filled 2015-12-11 (×6): qty 1

## 2015-12-11 MED ORDER — PIPERACILLIN-TAZOBACTAM 3.375 G IVPB
3.3750 g | Freq: Three times a day (TID) | INTRAVENOUS | Status: DC
  Administered 2015-12-11 – 2015-12-13 (×5): 3.375 g via INTRAVENOUS
  Filled 2015-12-11 (×5): qty 50

## 2015-12-11 MED ORDER — FLUOXETINE HCL 20 MG PO CAPS
20.0000 mg | ORAL_CAPSULE | Freq: Every day | ORAL | Status: DC
  Administered 2015-12-11 – 2015-12-16 (×6): 20 mg via ORAL
  Filled 2015-12-11 (×6): qty 1

## 2015-12-11 MED ORDER — OMEGA-3-ACID ETHYL ESTERS 1 G PO CAPS
1.0000 | ORAL_CAPSULE | Freq: Every day | ORAL | Status: DC
  Administered 2015-12-11 – 2015-12-16 (×6): 1 g via ORAL
  Filled 2015-12-11 (×6): qty 1

## 2015-12-11 MED ORDER — ACETAMINOPHEN 650 MG RE SUPP: 650.0000 mg | Freq: Four times a day (QID) | RECTAL | Status: DC | PRN

## 2015-12-11 MED ORDER — IOPAMIDOL (ISOVUE-300) INJECTION 61%
100.0000 mL | Freq: Once | INTRAVENOUS | Status: AC | PRN
  Administered 2015-12-11: 100 mL via INTRAVENOUS

## 2015-12-11 MED ORDER — METRONIDAZOLE IN NACL 5-0.79 MG/ML-% IV SOLN
500.0000 mg | Freq: Once | INTRAVENOUS | Status: AC
  Administered 2015-12-11: 500 mg via INTRAVENOUS
  Filled 2015-12-11: qty 100

## 2015-12-11 MED ORDER — LEVOTHYROXINE SODIUM 112 MCG PO TABS
112.0000 ug | ORAL_TABLET | Freq: Every day | ORAL | Status: DC
  Administered 2015-12-11 – 2015-12-16 (×6): 112 ug via ORAL
  Filled 2015-12-11 (×6): qty 1

## 2015-12-11 NOTE — ED Notes (Signed)
Pt being sent by PCP.  C/o diarrhea x weeks, fever, and increasing weakness.  Pt had fever of 102 at home.  Hx of lymphoma and chronic sinus infections.  Pt continually on antibiotics.

## 2015-12-11 NOTE — Progress Notes (Signed)
Subjective:    Patient ID: [name], female    DOB: 1940-09-13, 75 y.o.   MRN: [id number]  HPI  Here with malaise  Just finished last course of augmentin  She will call him today and likely get back on it  It is decreasing her appetite - cannot make herself eat   Temp is 99.1  Wt Readings from Last 3 Encounters:  12/11/15 234 lb 8 oz (106.4 kg)  10/31/15 247 lb (112 kg)  10/29/15 245 lb 12 oz (111.5 kg)     Hemorrhoids worse for 2 weeks  From diarrhea from her abx  Using suppositories and topical lidocaine (px from [employer] in the past )-brought the label   occ scant BRB -not today  Uses dove soap and water every time she has a BM  Itching and burning   Some cough-non productive   She thinks she needs to be hospitalized and wants to go to [facility]    Saw Dr Redmond Baseman for chronic sinusitis on 10/26 She is planning endoscopic sinus surgery  Will need cardiac clearance -next week and then will plan surgery   Planning IVIG next week - not chemo   No chemo recently (since December)   Patient Active Problem List   Diagnosis Date Noted  . Colitis 12/11/2015  . Basal cell carcinoma 10/31/2015  . Neuropathy ([place]) 10/31/2015  . S/P cataract surgery 10/31/2015  . Chronic rhinitis 10/31/2015  . Moderate persistent asthma 10/31/2015  . Physical deconditioning 10/16/2015  . Chronic ethmoidal sinusitis 09/18/2015  . 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 Cough 07/18/2015  . Port catheter in place 07/11/2015  . Generalized weakness 05/11/2015  . Benign essential HTN   . PAF (paroxysmal atrial fibrillation) ([place])   . Internal hemorrhoids 02/21/2015  . Rectal bleeding 02/06/2015  . 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 Thrombocytopenia ([place]) 01/29/2015  . AKI (acute kidney injury) ([place])   . Elevated troponin   . Atrial fibrillation with rapid ventricular response ([place]) 01/23/2015  . New onset a-fib ([place]) 01/23/2015  . Hypertension  01/23/2015  . CHF (congestive heart failure) ([place]) 01/21/2015  . Chronic systolic CHF (congestive heart failure), NYHA class 2 ([place]) 01/11/2015  . Fever in adult 01/10/2015  . Immunocompromised status associated with infection ([place]) 01/08/2015  . 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 Preventive measure 11/14/2014  . Parotid mass 11/14/2014  . Chronic lower back pain 09/28/2014  . Mediastinal lymphadenopathy 09/27/2014  . Neuropathy due to chemotherapeutic drug ([place]) 06/19/2014  . Peripheral neuropathy due to chemotherapy ([place]) 04/03/2014  . Trochanteric bursitis of right hip 04/03/2014  . Anemia in chronic illness 02/21/2014  . Elevated liver enzymes 02/21/2014  . Urinary tract infection 11/18/2013  . Candidal intertrigo 09/23/2013  . Atrophic vaginitis 09/23/2013  . Drug-induced neutropenia ([place]) 09/01/2013  . History of colon cancer 06/17/2013  . History of Hodgkin's lymphoma 05/20/2013  . Conjunctivitis, acute 04/27/2013  . Cold sore 04/27/2013  . Hyperglycemia 03/11/2013  . Hodgkin lymphoma ([place]) 02/10/2013  . Acute maxillary sinusitis 02/09/2013  . Other malaise and fatigue 01/04/2013  . Shingles 11/22/2012  . Cystocele 09/22/2012  . Encounter for Medicare annual wellness exam 09/17/2012  . Left knee pain 09/10/2012  . Thoracic back pain 06/01/2012  . Skin lesion of face 05/17/2012  . Other screening mammogram 07/22/2011  . Routine gynecological examination  07/22/2011  . Colon cancer screening 07/22/2011  . History of anemia 05/06/2011  . 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 Hypothyroid 02/24/2011  . Varicose veins 02/24/2011  . Obesity 11/20/2010  . Colon cancer ([place]) 08/23/2009  . 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 Chronic maxillary sinusitis 07/03/2009  . THROAT PAIN, CHRONIC 07/03/2009  . S/P colon resection 02/10/2009  .  GERD 12/20/2008  . Anxiety state, unspecified 08/22/2008  . MENOPAUSAL SYNDROME 07/13/2007  . High cholesterol 07/08/2006  . Allergic rhinitis 07/08/2006  . OVERACTIVE BLADDER 07/08/2006  . 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 HYPOTHYROIDISM 06/25/2006  . Depression 06/25/2006  . PERIPHERAL VASCULAR DISEASE 06/25/2006  . OSTEOARTHRITIS 06/25/2006  . LEG EDEMA 06/25/2006  . SKIN CANCER, HX OF 06/25/2006  . COLONIC POLYPS, HX OF 06/25/2006   Past Medical History:  Diagnosis Date  . Anemia    hx blood tx  . Benign essential HTN   . Cancer ([place])    skin cancer- basal cell on arm / colon 2011  . Chronic systolic CHF (congestive heart failure), NYHA class 2 ([place]) 01/2015  . Colon cancer ([place])    2011, s/p surgery, in remission  . Colon polyps   . Degenerative disk disease    Thoracic spine  . Depression   . 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 Generalized weakness 05/11/2015  . GERD (gastroesophageal reflux disease)   . Heart murmur   . hodgkins lymphoma   . Hypothyroidism   . Lymphoma ([place])   . Neuropathy due to chemotherapeutic drug ([place]) 06/19/2014  . NICM (nonischemic cardiomyopathy) ([place]) 01/2015  . Osteoarthritis    hands  . 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 PAF (paroxysmal atrial fibrillation) ([place])   . Peripheral vascular disease ([place])   . Pneumonia    Past Surgical History:  Procedure Laterality Date  . BONE MARROW BIOPSY  05/27/13  . BREAST BIOPSY  1996  . CARDIAC CATHETERIZATION N/A 01/25/2015   Procedure: Left Heart Cath and Coronary Angiography;  Surgeon: Peter M Martinique, MD;  Location: [facility];  Service: Cardiovascular;  Laterality: N/A;  . CATARACT EXTRACTION, BILATERAL  2016  . CHOLECYSTECTOMY    . COLECTOMY  8/11  . COLONOSCOPY W/ BIOPSIES    . DG BIOPSY LUNG    . LYMPH NODE BIOPSY N/A 11/01/2014   Procedure: MEDIASTINAL LYMPH NODE BIOPSY;   Surgeon: Grace Isaac, MD;  Location: [facility];  Service: Thoracic;  Laterality: N/A;  . MEDIASTINOSCOPY N/A 11/01/2014   Procedure: MEDIASTINOSCOPY;  Surgeon: Grace Isaac, MD;  Location: [facility];  Service: Thoracic;  Laterality: N/A;  . PLANTAR FASCIA RELEASE    . port-a-cath insertion    . SPLENECTOMY     lymphoma  . TENDON RELEASE     Right thumb   . VAGINAL HYSTERECTOMY    . VENTRAL HERNIA REPAIR  1998  . VIDEO BRONCHOSCOPY WITH ENDOBRONCHIAL ULTRASOUND N/A 10/17/2014   Procedure: VIDEO BRONCHOSCOPY WITH ENDOBRONCHIAL ULTRASOUND;  Surgeon: Javier Glazier, MD;  Location: [facility];  Service: Thoracic;  Laterality: N/A;  . VIDEO BRONCHOSCOPY WITH ENDOBRONCHIAL ULTRASOUND N/A 11/01/2014   Procedure: VIDEO BRONCHOSCOPY WITH ENDOBRONCHIAL ULTRASOUND;  Surgeon: Grace Isaac, MD;  Location: [facility];  Service: Thoracic;  Laterality: N/A;   Social History  Substance Use Topics  . Smoking status: Never Smoker  . Smokeless tobacco: Never Used  Comment: Second-hand exposure through father.  . Alcohol use No   Family History  Problem Relation Age of Onset  . Coronary artery disease Mother   . Diabetes Mother   . Fibromyalgia Daughter     chronic pain   . COPD Daughter   . Diabetes Brother   . Asthma Daughter   . Rheum arthritis Brother   . Clotting disorder Brother   . Alcohol abuse Sister   . Colon cancer Neg Hx   . Colon polyps Neg Hx   . Stomach cancer Neg Hx   . Rectal cancer Neg Hx   . Ulcerative colitis Neg Hx   . Crohn's disease Neg Hx    Allergies  Allergen Reactions  . Achromycin [Tetracycline Hcl] Other (See Comments)    Pt does not remember reaction  . Allopurinol Other (See Comments)    REACTION: Unsure of reaction happene years ago  . Astelin [Azelastine Hcl] Other (See Comments)    Reaction unknown  . Cephalexin Other (See Comments)    REACTION: unsure of reaction happened yrs ago.  . Codeine Other (See Comments)    REACTION: abd. pain  . Lisinopril  Cough  . Meloxicam Other (See Comments)    REACTION: GI symptoms  . Minocycline Other (See Comments)    Abdominal pain  . Nabumetone Other (See Comments)    REACTION: reaction not known  . Nyquil [Pseudoeph-Doxylamine-Dm-Apap] Hives  . Sulfa Antibiotics Other (See Comments)    Gi side eff   . Zolpidem Tartrate Other (See Comments)    REACTION: feels too drugged  . Buspar [Buspirone Hcl] Other (See Comments)    Dizziness, and not as effective for anxiety  . Ciprofloxacin Rash   No current facility-administered medications on file prior to visit.    Current Outpatient Prescriptions on File Prior to Visit  Medication Sig Dispense Refill  . acetaminophen (TYLENOL) 650 MG CR tablet Take 650 mg by mouth every 8 (eight) hours as needed for pain or fever.     Marland Kitchen acyclovir (ZOVIRAX) 400 MG tablet Take 1 tablet (400 mg total) by mouth daily. 90 tablet 3  . Albuterol Sulfate (PROAIR RESPICLICK) 329 (90 Base) MCG/ACT AEPB Inhale 1-2 puffs into the lungs every 6 (six) hours as needed (SOB, wheezing).     Marland Kitchen aspirin 81 MG tablet Take 81 mg by mouth daily.    . Calcium Carbonate-Vitamin D (CALCIUM 600+D) 600-400 MG-UNIT per tablet Take 1 tablet by mouth every evening.     . celecoxib (CELEBREX) 200 MG capsule Take 1 capsule (200 mg total) by mouth daily as needed for moderate pain. (Patient taking differently: Take 200 mg by mouth every other day. ) 90 capsule 3  . Cholecalciferol (VITAMIN D-3) 1000 UNITS CAPS Take 1,000 Units by mouth daily.    Marland Kitchen docusate sodium (COLACE) 100 MG capsule Take 100 mg by mouth at bedtime.    Marland Kitchen estradiol (ESTRACE) 1 MG tablet Take 1 tablet (1 mg total) by mouth daily. 90 tablet 3  . fexofenadine (ALLEGRA) 180 MG tablet TAKE 1 TABLET (180 MG TOTAL) BY MOUTH DAILY. (*OTC)  3  . FLUoxetine (PROZAC) 20 MG capsule Take 1 capsule (20 mg total) by mouth daily. 90 capsule 3  . fluticasone (FLONASE) 50 MCG/ACT nasal spray Place 2 sprays into both nostrils daily. (Patient taking  differently: Place 2 sprays into both nostrils 2 (two) times daily. ) 48 g 3  . fluticasone furoate-vilanterol (BREO ELLIPTA) 200-25 MCG/INH AEPB Inhale 1 puff into  the lungs daily. 30 each 6  . furosemide (LASIX) 20 MG tablet Take 1 tablet (20 mg total) by mouth daily. Take 1 tab by mouth daily for weight gain. 90 tablet 3  . gabapentin (NEURONTIN) 300 MG capsule Take 1 capsule (300 mg total) by mouth 2 (two) times daily. 180 capsule 3  . levothyroxine (SYNTHROID, LEVOTHROID) 112 MCG tablet Take 1 tablet (112 mcg total) by mouth daily before breakfast. 90 tablet 3  . lidocaine-prilocaine (EMLA) cream Apply 1 application topically as needed (For port-a-cath.). Reported on 02/22/2015    . losartan (COZAAR) 25 MG tablet Take 1 tablet (25 mg total) by mouth daily. 90 tablet 3  . metoprolol (LOPRESSOR) 50 MG tablet TAKE 1 TABLET (50 MG TOTAL) BY MOUTH 2 (TWO) TIMES DAILY. 180 tablet 3  . mometasone-formoterol (DULERA) 200-5 MCG/ACT AERO Inhale 2 puffs into the lungs 2 (two) times daily. (Patient not taking: Reported on 12/11/2015) 1 Inhaler 5  . Omega-3 350 MG CAPS Take 1 capsule by mouth daily. Reported on 02/22/2015    . omeprazole (PRILOSEC) 40 MG capsule Take 1 capsule (40 mg total) by mouth daily. 90 capsule 3  . ondansetron (ZOFRAN) 8 MG tablet Take 8 mg by mouth every 6 (six) hours as needed for nausea or vomiting. Reported on 03/05/2015    . polyethylene glycol (MIRALAX / GLYCOLAX) packet Take 17 g by mouth at bedtime.    . prochlorperazine (COMPAZINE) 10 MG tablet Take 10 mg by mouth every 6 (six) hours as needed for nausea or vomiting. Reported on 03/05/2015    . psyllium (METAMUCIL) 58.6 % packet Take 1-3 packets by mouth at bedtime.     . simvastatin (ZOCOR) 40 MG tablet Take 1 tablet (40 mg total) by mouth at bedtime. 90 tablet 3  . vitamin E (VITAMIN E) 400 UNIT capsule Take 400 Units by mouth daily. Reported on 03/05/2015      Review of Systems Review of Systems  Constitutional: pos for  fever/anorexia and malaise  Pos for weakness (general) Eyes: Negative for pain and visual disturbance.  ENT pos for chronic congestion and sinus pain  Respiratory: Negative for cough and shortness of breath.   Cardiovascular: Negative for cp or palpitations    Gastrointestinal: Negative for nausea,  and constipation. pos for diarrhea  Genitourinary: Negative for urgency and frequency.  Skin: Negative for pallor or rash   Neurological: Negative for weakness, , numbness and headaches.  Hematological: Negative for adenopathy. Does not bruise/bleed easily.  Psychiatric/Behavioral: Negative for dysphoric mood. The patient is not nervous/anxious.         Objective:   Physical Exam  Constitutional: She appears well-developed and well-nourished. No distress.  Ill appearing/ generally weak and req assist with gait   HENT:  Head: Normocephalic and atraumatic.  Right Ear: External ear normal.  Left Ear: External ear normal.  Mouth/Throat: Oropharynx is clear and moist. No oropharyngeal exudate.  Nares are injected and congested  Bilateral maxillary sinus tenderness  Post nasal drip   Eyes: Conjunctivae and EOM are normal. Pupils are equal, round, and reactive to light. Right eye exhibits no discharge. Left eye exhibits no discharge.  Neck: Normal range of motion. Neck supple.  Cardiovascular: Normal rate and regular rhythm.   Pulmonary/Chest: Effort normal and breath sounds normal. No respiratory distress. She has no wheezes. She has no rales.  Abdominal: Soft. Bowel sounds are normal. She exhibits no distension and no mass. There is tenderness. There is no rebound and no  guarding.  Mild bilat LQ abd tenderness Nl bs  Musculoskeletal: She exhibits no edema.  Lymphadenopathy:    She has no cervical adenopathy.  Neurological: She is alert. No cranial nerve deficit. Coordination normal.  Skin: Skin is warm and dry. No rash noted. There is pallor.  Psychiatric: She has a normal mood and  affect.          Assessment & Plan:   Problem List Items Addressed This Visit      Cardiovascular and Mediastinum   Internal hemorrhoids - Primary     Respiratory   Chronic maxillary sinusitis    Just finished tx with augmentin and planning sinus surgery  Fever and diarrhea worrisome for c diff        Other   Fever in adult    Fever in 75 yo with hx of lymphoma (getting IVIG tx) as well as chronic sinusitis with malaise/weakness/poor appetite and diarrhea (? From abx or I wonder c diff) Proposed cxr and lab for septic w/u-pt states she would rather to to the ER She is going to go to [facility] ED now by car-husband will take her        Other Visit Diagnoses   None.

## 2015-12-11 NOTE — Progress Notes (Signed)
CRITICAL VALUE ALERT  Critical value received:  Lactic Acid 2.2  Date of notification:  12/11/15  Time of notification:  19:30  Critical value read back:Yes.    Nurse who received alert:  Ruben Im, RN  MD notified (1st page):  Tylene Fantasia  Time of first page:  19:39  MD notified (2nd page):  Time of second page:  Responding MD:  Tylene Fantasia  Time MD responded:  19:45

## 2015-12-11 NOTE — Progress Notes (Signed)
Pharmacy Antibiotic Note  Diamond Boyd is a 75 y.o. female admitted on 12/11/2015 with intra-abdominal infection.   She presented with weakness, fever, and diarrhea.  PMH significant for Hodgkin's Lymphoma for which she's getting IVIG and multiple recent sinus infections for which she's had courses of amoxicillin.   Pharmacy was consulted for Levaquin & Flagyl dosing.  Multiple antibiotic allergies noted including Cipro (rash) & Kelfex (unknown rxn).  She can tolerate penicillins however.   Discussed with Dr Waldron Labs- given Cipro allergy/Flagyl IV shortage, will change abx to Zosyn.  Patient already on oral Vanc to cover C.dif (unable to collect Cdiff PCR due to small stool amount).  12/11/2015:  Febrile on admission (Tm102.9) which has resolved  CT abdomen +colitis  Lactic acid elevated, but no leukocytosis  Estimated CrCl>21m/min  Plan: Zosyn 3.375gm IV x 1st dose over 323m, then Zosyn 3.375gm IV Q8h to be infused over 4hrs Vanc PO '125mg'$  PO q6h per MD Monitor renal function and cx data   Height: '5\' 5"'$  (165.1 cm) Weight: 234 lb (106.1 kg) IBW/kg (Calculated) : 57  Temp (24hrs), Avg:100 F (37.8 C), Min:98.8 F (37.1 C), Max:102.9 F (39.4 C)   Recent Labs Lab 12/11/15 1008 12/11/15 1042  WBC 10.1  --   CREATININE 1.02*  --   LATICACIDVEN  --  3.32*    Estimated Creatinine Clearance: 56.7 mL/min (by C-G formula based on SCr of 1.02 mg/dL (H)).    Allergies  Allergen Reactions  . Achromycin [Tetracycline Hcl] Other (See Comments)    Pt does not remember reaction  . Allopurinol Other (See Comments)    REACTION: Unsure of reaction happene years ago  . Astelin [Azelastine Hcl] Other (See Comments)    Reaction unknown  . Cephalexin Other (See Comments)    REACTION: unsure of reaction happened yrs ago.  . Codeine Other (See Comments)    REACTION: abd. pain  . Lisinopril Cough  . Meloxicam Other (See Comments)    REACTION: GI symptoms  . Minocycline Other  (See Comments)    Abdominal pain  . Nabumetone Other (See Comments)    REACTION: reaction not known  . Nyquil [Pseudoeph-Doxylamine-Dm-Apap] Hives  . Sulfa Antibiotics Other (See Comments)    Gi side eff   . Zolpidem Tartrate Other (See Comments)    REACTION: feels too drugged  . Buspar [Buspirone Hcl] Other (See Comments)    Dizziness, and not as effective for anxiety  . Ciprofloxacin Rash    Antimicrobials this admission: Zosyn 10/31 >>  Vanc (oral) 10/31 >>  Flagyl 10/31 x1 dose  Dose adjustments this admission:  Microbiology results: 10/31 BCx:   Thank you for allowing pharmacy to be a part of this patient's care.  LiBiagio Borg0/31/2017 3:43 PM

## 2015-12-11 NOTE — ED Provider Notes (Signed)
[facility] DEPT Provider Note   CSN: [id number] Arrival date & time: 12/11/15  0768     History   Chief Complaint Chief Complaint  Patient presents with  . Weakness    HPI Diamond Boyd is a 75 y.o. female.  Pt presents to the ED today because of weakness and loss of appetite.  The pt has been on Augmentin for weeks due to a chronic sinus infection.  She has been getting progressively weaker and now has diarrhea.  Diarrhea is only about 2 times per day.  The pt said she is getting so weak that she can't get up and her husband has to help her with everything.  She did go to her doctor's office today who offered outpatient work up, but pt wanted to go to the ED.  Pt feels SOB, but has been SOB for a year.  She has a hx of B cell lymphoma.  Her last chemotherapy was in December of 2016.  She is concerned that her lymphoma has returned.  She had a PET scan in May that showed:  1. Improved metabolic activity in the bony lesions, compared to 12/13/14. The diffuse hypermetabolic activity from 08/81/1031 was likely due to marrow stimulation. There continue to be multiple focal hypermetabolic lesions within the skeleton. 2. Resolution of abnormal activity in certain areas such as the parotid glands. 3. Persistent hypermetabolic thyroid activity, diffusely, probably from thyroiditis. 4. The left hilar region has a maximum SUV of 5.5, previously 3.8. Background mediastinal activity of the blood pool has a maximum SUV of about 4.9; hands this left hilar activity is only very slightly above the background mediastinal activity.  From Dr. Calton Dach note in September in 2017.  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 I discussed the importance of discontinuation of rituximab and to consider monthly IVIG treatment. The risks, benefit, side effects of IVIG treatment including risk of allergic  reaction, hives, itching, serum sickness were discussed and she agreed to proceed. I would dose her at 1 g/kg once every 4 weeks She is doing well so far with no recurrence of infection. I recommend we continue treatment until spring of next year          Past Medical History:  Diagnosis Date  . Anemia    hx blood tx  . Benign essential HTN   . Cancer ([place])    skin cancer- basal cell on arm / colon 2011  . Chronic systolic CHF (congestive heart failure), NYHA class 2 ([place]) 01/2015  . Colon cancer ([place])    2011, s/p surgery, in remission  . Colon polyps   . Degenerative disk disease    Thoracic spine  . Depression   . 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 Generalized weakness 05/11/2015  . GERD (gastroesophageal reflux disease)   . Heart murmur   . hodgkins lymphoma   . Hypothyroidism   . Lymphoma ([place])   . 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 NICM (nonischemic cardiomyopathy) ([place]) 01/2015  . Osteoarthritis    hands  . 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 PAF (paroxysmal atrial fibrillation) ([place])   . Peripheral vascular disease ([place])   . Pneumonia     Patient Active Problem List   Diagnosis Date Noted  . Basal cell carcinoma 10/31/2015  . Neuropathy ([place]) 10/31/2015  .  S/P cataract surgery 10/31/2015  . Chronic rhinitis 10/31/2015  . Moderate persistent asthma 10/31/2015  . Physical deconditioning 10/16/2015  . Chronic ethmoidal sinusitis 09/18/2015  . Hypogammaglobulinemia, acquired ([place]) 09/10/2015  . Cough 07/18/2015  . Port catheter in place 07/11/2015  . Generalized weakness 05/11/2015  . Benign essential HTN   . PAF (paroxysmal atrial fibrillation) ([place])   . Internal hemorrhoids 02/21/2015  . Rectal bleeding 02/06/2015  . 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 Thrombocytopenia ([place]) 01/29/2015  . AKI (acute kidney injury) ([place])    . Elevated troponin   . Atrial fibrillation with rapid ventricular response ([place]) 01/23/2015  . New onset a-fib ([place]) 01/23/2015  . Hypertension 01/23/2015  . CHF (congestive heart failure) ([place]) 01/21/2015  . Chronic systolic CHF (congestive heart failure), NYHA class 2 ([place]) 01/11/2015  . Fever in adult 01/10/2015  . Immunocompromised status associated with infection ([place]) 01/08/2015  . 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 Preventive measure 11/14/2014  . Parotid mass 11/14/2014  . Chronic lower back pain 09/28/2014  . Mediastinal lymphadenopathy 09/27/2014  . 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 Peripheral neuropathy due to chemotherapy ([place]) 04/03/2014  . Trochanteric bursitis of right hip 04/03/2014  . Anemia in chronic illness 02/21/2014  . Elevated liver enzymes 02/21/2014  . Urinary tract infection 11/18/2013  . Candidal intertrigo 09/23/2013  . Atrophic vaginitis 09/23/2013  . Drug-induced neutropenia ([place]) 09/01/2013  . History of colon cancer 06/17/2013  . History of Hodgkin's lymphoma 05/20/2013  . Conjunctivitis, acute 04/27/2013  . Cold sore 04/27/2013  . Hyperglycemia 03/11/2013  . Hodgkin lymphoma ([place]) 02/10/2013  . Acute maxillary sinusitis 02/09/2013  . Other malaise and fatigue 01/04/2013  . Shingles 11/22/2012  . Cystocele 09/22/2012  . Encounter for Medicare annual wellness exam 09/17/2012  . Left knee pain 09/10/2012  . Thoracic back pain 06/01/2012  . Skin lesion of face 05/17/2012  . Other screening mammogram 07/22/2011  . Routine gynecological examination 07/22/2011  . Colon cancer screening 07/22/2011  . History of anemia 05/06/2011  . 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 Hypothyroid 02/24/2011  . Varicose veins 02/24/2011  . Obesity 11/20/2010  . Colon cancer ([place]) 08/23/2009  . DISORDERS OF  PHOSPHORUS METABOLISM 08/09/2009  . Chronic maxillary sinusitis 07/03/2009  . THROAT PAIN, CHRONIC 07/03/2009  . S/P colon resection 02/10/2009  . GERD 12/20/2008  . Anxiety state, unspecified 08/22/2008  . MENOPAUSAL SYNDROME 07/13/2007  . High cholesterol 07/08/2006  . Allergic rhinitis 07/08/2006  . OVERACTIVE BLADDER 07/08/2006  . 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 HYPOTHYROIDISM 06/25/2006  . Depression 06/25/2006  . PERIPHERAL VASCULAR DISEASE 06/25/2006  . OSTEOARTHRITIS 06/25/2006  . LEG EDEMA 06/25/2006  . SKIN CANCER, HX OF 06/25/2006  . COLONIC POLYPS, HX OF 06/25/2006    Past Surgical History:  Procedure Laterality Date  . BONE MARROW BIOPSY  05/27/13  . BREAST BIOPSY  1996  . CARDIAC CATHETERIZATION N/A 01/25/2015   Procedure: Left Heart Cath and Coronary Angiography;  Surgeon: Peter M Martinique, MD;  Location: [facility];  Service: Cardiovascular;  Laterality: N/A;  . CATARACT EXTRACTION, BILATERAL  2016  . CHOLECYSTECTOMY    . COLECTOMY  8/11  . COLONOSCOPY W/ BIOPSIES    . DG BIOPSY LUNG    . LYMPH NODE BIOPSY N/A 11/01/2014   Procedure: MEDIASTINAL LYMPH NODE BIOPSY;  Surgeon: Grace Isaac, MD;  Location: [facility];  Service: Thoracic;  Laterality: N/A;  . MEDIASTINOSCOPY N/A 11/01/2014   Procedure: MEDIASTINOSCOPY;  Surgeon: Grace Isaac, MD;  Location: [facility];  Service: Thoracic;  Laterality: N/A;  . PLANTAR FASCIA RELEASE    . port-a-cath insertion    . SPLENECTOMY     lymphoma  . TENDON RELEASE     Right thumb   . VAGINAL HYSTERECTOMY    . VENTRAL HERNIA REPAIR  1998  . VIDEO BRONCHOSCOPY WITH ENDOBRONCHIAL ULTRASOUND N/A 10/17/2014   Procedure: VIDEO BRONCHOSCOPY WITH ENDOBRONCHIAL ULTRASOUND;  Surgeon: Javier Glazier, MD;  Location: [facility];  Service: Thoracic;  Laterality: N/A;  . VIDEO BRONCHOSCOPY WITH ENDOBRONCHIAL ULTRASOUND N/A 11/01/2014   Procedure: VIDEO BRONCHOSCOPY WITH ENDOBRONCHIAL ULTRASOUND;  Surgeon: Grace Isaac,  MD;  Location: [facility];  Service: Thoracic;  Laterality: N/A;    OB History    No data available       Home Medications    Prior to Admission medications   Medication Sig Start Date End Date Taking? Authorizing Provider  acetaminophen (TYLENOL) 650 MG CR tablet Take 650 mg by mouth every 8 (eight) hours as needed for pain or fever.    Yes Historical Provider, MD  acyclovir (ZOVIRAX) 400 MG tablet Take 1 tablet (400 mg total) by mouth daily. 10/29/15  Yes Abner Greenspan, MD  Albuterol Sulfate (PROAIR RESPICLICK) 643 (90 Base) MCG/ACT AEPB Inhale 1-2 puffs into the lungs every 6 (six) hours as needed (SOB, wheezing).    Yes Historical Provider, MD  aspirin 81 MG tablet Take 81 mg by mouth daily.   Yes Historical Provider, MD  Calcium Carbonate-Vitamin D (CALCIUM 600+D) 600-400 MG-UNIT per tablet Take 1 tablet by mouth every evening.    Yes Historical Provider, MD  celecoxib (CELEBREX) 200 MG capsule Take 1 capsule (200 mg total) by mouth daily as needed for moderate pain. Patient taking differently: Take 200 mg by mouth every other day.  10/29/15  Yes Abner Greenspan, MD  Cholecalciferol (VITAMIN D-3) 1000 UNITS CAPS Take 1,000 Units by mouth daily.   Yes Historical Provider, MD  docusate sodium (COLACE) 100 MG capsule Take 100 mg by mouth at bedtime.   Yes Historical Provider, MD  estradiol (ESTRACE) 1 MG tablet Take 1 tablet (1 mg total) by mouth daily. 10/29/15  Yes Marne A Tower, MD  fexofenadine (ALLEGRA) 180 MG tablet TAKE 1 TABLET (180 MG TOTAL) BY MOUTH DAILY. (*OTC) 08/01/15  Yes Historical Provider, MD  FLUoxetine (PROZAC) 20 MG capsule Take 1 capsule (20 mg total) by mouth daily. 10/29/15  Yes Abner Greenspan, MD  fluticasone (FLONASE) 50 MCG/ACT nasal spray Place 2 sprays into both nostrils daily. Patient taking differently: Place 2 sprays into both nostrils 2 (two) times daily.  10/29/15  Yes Marne A Tower, MD  fluticasone furoate-vilanterol (BREO ELLIPTA) 200-25 MCG/INH AEPB Inhale 1 puff  into the lungs daily. 11/21/15 12/21/15 Yes Valentina Shaggy, MD  furosemide (LASIX) 20 MG tablet Take 1 tablet (20 mg total) by mouth daily. Take 1 tab by mouth daily for weight gain. 10/29/15  Yes Abner Greenspan, MD  gabapentin (NEURONTIN) 300 MG capsule Take 1 capsule (300 mg total) by mouth 2 (two) times daily. 10/29/15  Yes Abner Greenspan, MD  levothyroxine (SYNTHROID, LEVOTHROID) 112 MCG tablet Take 1 tablet (112 mcg total) by mouth daily before breakfast. 10/29/15  Yes Abner Greenspan, MD  lidocaine-prilocaine (EMLA) cream Apply  1 application topically as needed (For port-a-cath.). Reported on 02/22/2015   Yes Historical Provider, MD  losartan (COZAAR) 25 MG tablet Take 1 tablet (25 mg total) by mouth daily. 10/29/15  Yes Abner Greenspan, MD  metoprolol (LOPRESSOR) 50 MG tablet TAKE 1 TABLET (50 MG TOTAL) BY MOUTH 2 (TWO) TIMES DAILY. 10/29/15  Yes Abner Greenspan, MD  Omega-3 350 MG CAPS Take 1 capsule by mouth daily. Reported on 02/22/2015   Yes Historical Provider, MD  omeprazole (PRILOSEC) 40 MG capsule Take 1 capsule (40 mg total) by mouth daily. 10/29/15  Yes Abner Greenspan, MD  ondansetron (ZOFRAN) 8 MG tablet Take 8 mg by mouth every 6 (six) hours as needed for nausea or vomiting. Reported on 03/05/2015 12/25/14  Yes Historical Provider, MD  polyethylene glycol (MIRALAX / GLYCOLAX) packet Take 17 g by mouth at bedtime.   Yes Historical Provider, MD  prochlorperazine (COMPAZINE) 10 MG tablet Take 10 mg by mouth every 6 (six) hours as needed for nausea or vomiting. Reported on 03/05/2015 12/25/14  Yes Historical Provider, MD  psyllium (METAMUCIL) 58.6 % packet Take 1-3 packets by mouth at bedtime.    Yes Historical Provider, MD  simvastatin (ZOCOR) 40 MG tablet Take 1 tablet (40 mg total) by mouth at bedtime. 10/29/15  Yes Abner Greenspan, MD  vitamin E (VITAMIN E) 400 UNIT capsule Take 400 Units by mouth daily. Reported on 03/05/2015   Yes Historical Provider, MD  lidocaine (XYLOCAINE) 5 % ointment Apply 1  application topically 3 (three) times daily as needed. To rectal area Patient not taking: Reported on 12/11/2015 12/11/15   Abner Greenspan, MD  mometasone-formoterol [facility]) 200-5 MCG/ACT AERO Inhale 2 puffs into the lungs 2 (two) times daily. Patient not taking: Reported on 12/11/2015 11/08/15   Valentina Shaggy, MD    Family History Family History  Problem Relation Age of Onset  . Coronary artery disease Mother   . Diabetes Mother   . Fibromyalgia Daughter     chronic pain   . COPD Daughter   . Diabetes Brother   . Asthma Daughter   . Rheum arthritis Brother   . Clotting disorder Brother   . Alcohol abuse Sister   . Colon cancer Neg Hx   . Colon polyps Neg Hx   . Stomach cancer Neg Hx   . Rectal cancer Neg Hx   . Ulcerative colitis Neg Hx   . Crohn's disease Neg Hx     Social History Social History  Substance Use Topics  . Smoking status: Never Smoker  . Smokeless tobacco: Never Used     Comment: Second-hand exposure through father.  . Alcohol use No     Allergies   Achromycin [tetracycline hcl]; Allopurinol; Astelin [azelastine hcl]; Cephalexin; Codeine; Lisinopril; Meloxicam; Minocycline; Nabumetone; Nyquil [pseudoeph-doxylamine-dm-apap]; Sulfa antibiotics; Zolpidem tartrate; Buspar [buspirone hcl]; and Ciprofloxacin   Review of Systems Review of Systems  Constitutional: Positive for appetite change, fatigue and fever.  HENT: Positive for congestion and sinus pressure.   Respiratory: Positive for shortness of breath.   Gastrointestinal: Positive for diarrhea.  Neurological: Positive for weakness.  All other systems reviewed and are negative.    Physical Exam Updated Vital Signs BP (!) 109/52 (BP Location: Left Arm)   Pulse 72   Temp 99 F (37.2 C) (Oral)   Resp 15   Ht _0  (1.651 m)   Wt 234 lb (106.1 kg)   LMP 02/10/1970   SpO2 94%   BMI  38.94 kg/m   Physical Exam  Constitutional: She is oriented to person, place, and time. She appears  well-developed and well-nourished.  HENT:  Head: Normocephalic and atraumatic.  Right Ear: External ear normal.  Left Ear: External ear normal.  Nose: Nose normal.  Mouth/Throat: Mucous membranes are dry.  Eyes: Conjunctivae and EOM are normal. Pupils are equal, round, and reactive to light.  Neck: Normal range of motion. Neck supple.  Cardiovascular: Normal rate, regular rhythm, normal heart sounds and intact distal pulses.   Pulmonary/Chest: Effort normal and breath sounds normal.  Abdominal: Soft. Bowel sounds are normal.  Neurological: She is alert and oriented to person, place, and time.  Skin: Skin is warm.  Psychiatric: She has a normal mood and affect. Her behavior is normal. Judgment and thought content normal.  Nursing note and vitals reviewed.    ED Treatments / Results  Labs (all labs ordered are listed, but only abnormal results are displayed) Labs Reviewed  CBC WITH DIFFERENTIAL/PLATELET - Abnormal; Notable for the following:       Result Value   RDW 18.1 (*)    Platelets 25 (*)    nRBC 3 (*)    Lymphs Abs 5.4 (*)    Monocytes Absolute 2.6 (*)    All other components within normal limits  COMPREHENSIVE METABOLIC PANEL - Abnormal; Notable for the following:    Sodium 132 (*)    Chloride 100 (*)    Glucose, Bld 100 (*)    Creatinine, Ser 1.02 (*)    Total Protein 6.2 (*)    Albumin 3.2 (*)    AST 67 (*)    Alkaline Phosphatase 243 (*)    GFR calc non Af Amer 52 (*)    All other components within normal limits  URINALYSIS, ROUTINE W REFLEX MICROSCOPIC (NOT AT [facility]) - Abnormal; Notable for the following:    Color, Urine AMBER (*)    APPearance CLOUDY (*)    Protein, ur 30 (*)    Leukocytes, UA MODERATE (*)    All other components within normal limits  TROPONIN I - Abnormal; Notable for the following:    Troponin I 0.04 (*)    All other components within normal limits  URINE MICROSCOPIC-ADD ON - Abnormal; Notable for the following:    Squamous Epithelial /  LPF 6-30 (*)    Bacteria, UA FEW (*)    All other components within normal limits  I-STAT CG4 LACTIC ACID, ED - Abnormal; Notable for the following:    Lactic Acid, Venous 3.32 (*)    All other components within normal limits  GASTROINTESTINAL PANEL BY PCR, STOOL (REPLACES STOOL CULTURE)  C DIFFICILE QUICK SCREEN W PCR REFLEX  CULTURE, BLOOD (ROUTINE X 2)  CULTURE, BLOOD (ROUTINE X 2)    EKG  EKG Interpretation None      EKG not coming through on MUSE NSR.  HR 78.  Non specific IVCD.  T wave inv 3, avf, V6 Radiology Dg Chest 2 View  Result Date: 12/11/2015 CLINICAL DATA:  Shortness of breath. EXAM: CHEST  2 VIEW COMPARISON:  Radiographs of July 17, 2015. FINDINGS: The heart size and mediastinal contours are within normal limits. Both lungs are clear. No pneumothorax or pleural effusion is noted. Atherosclerosis of thoracic aorta is noted. Right internal jugular Port-A-Cath is unchanged in position with distal tip in expected position of the SVC. The visualized skeletal structures are unremarkable. IMPRESSION: No active cardiopulmonary disease.  Aortic atherosclerosis. Electronically Signed   By:  Marijo Conception, M.D.   On: 12/11/2015 10:33   Ct Abdomen Pelvis W Contrast  Result Date: 12/11/2015 CLINICAL DATA:  75 year old female with diarrhea floor weeks. Fever. Increasing weakness. Rectal pain. History non-Hodgkin's lymphoma 1994, colon cancer 2011 and Hodgkin's disease 2015. Prior chemotherapy. Ongoing IVIG. Post cholecystectomy, splenectomy, hernia repair, colectomy and hysterectomy. Initial encounter. EXAM: CT ABDOMEN AND PELVIS WITH CONTRAST TECHNIQUE: Multidetector CT imaging of the abdomen and pelvis was performed using the standard protocol following bolus administration of intravenous contrast. CONTRAST:  121m ISOVUE-300 IOPAMIDOL (ISOVUE-300) INJECTION 61% COMPARISON:  07/10/2015 PET-CT. FINDINGS: Lower chest: Minimal scarring lung bases. No worrisome lesion. Coronary artery  calcifications. Heart size within normal limits. Small epicardial lymph nodes. Hepatobiliary: Elongated liver spanning over 20 cm. No worrisome hepatic lesion noted. Minimal periportal edema. Post cholecystectomy. Pancreas: No mass or inflammation. Spleen: Post splenectomy. Adrenals/Urinary Tract: No renal or adrenal mass. No renal or ureteral obstructing stone. No hydronephrosis. Stomach/Bowel: Under distended stomach with diffuse wall thickening. This may represent result of under distension and only slightly different than 07/10/2015 exam. Not able to completely exclude underlying gastritis, mass or ulceration. Duodenal diverticulum incidentally noted. Large portion of colon has been resected. Residual colon within the anterior central aspect of the abdomen with thickened walls, new since 07/10/2015 exam and therefore not able to exclude underlying inflammation or mass. No extra luminal bowel inflammatory process, free fluid or free air. Vascular/Lymphatic: Increase number of normal size retroperitoneal lymph nodes. Calcified aorta without aneurysmal dilation. Reproductive: Post hysterectomy.  No ovarian mass. Other: Diastases rectus musculature with scar from abdominal hernia. Bowel extends immediately beneath the operative site but without bowel containing obstructing hernia. Slightly prominent vessels without clear collaterals noted. Slightly dense breast parenchyma incompletely assessed by CT. Partially decompressed bladder with circumferential wall thickening. Musculoskeletal: Probable hemangioma L2. Several lucencies most prominent at L3 and L4 suggestive of involvement by tumor, minimally changed from prior exam. Degenerative changes L3-4 and L4-5. Fusion L5-S1. IMPRESSION: Large portion of colon has been resected. Residual colon within the anterior central aspect of the abdomen with thickened walls, new since 07/10/2015 exam and therefore not able to exclude underlying inflammation or mass. No extra  luminal bowel inflammatory process or free air. Under distended stomach with thickened walls minimally changed from prior exam and may be related to underdistention rather than mass or gastritis. Increased number of normal size retroperitoneal lymph nodes. The number of normal size lymph nodes has increased since prior exam. Increase number lucencies involving the L3 and L4 vertebral body may represent progressive osseous metastatic disease. Post splenectomy, cholecystectomy and hysterectomy. Elongated liver without focal mass. Aortic atherosclerosis. Electronically Signed   By: SGenia DelM.D.   On: 12/11/2015 13:02    Procedures Procedures (including critical care time)  Medications Ordered in ED Medications  sodium chloride 0.9 % bolus 1,000 mL (1,000 mLs Intravenous New Bag/Given 12/11/15 1008)    And  0.9 %  sodium chloride infusion (not administered)  metroNIDAZOLE (FLAGYL) IVPB 500 mg (not administered)  sodium chloride 0.9 % bolus 1,000 mL (1,000 mLs Intravenous New Bag/Given 12/11/15 1140)  acetaminophen (TYLENOL) tablet 1,000 mg (1,000 mg Oral Given 12/11/15 1136)  iopamidol (ISOVUE-300) 61 % injection 100 mL (100 mLs Intravenous Contrast Given 12/11/15 1205)     Initial Impression / Assessment and Plan / ED Course  I have reviewed the triage vital signs and the nursing notes.  Pertinent labs & imaging results that were available during my care of the patient  were reviewed by me and considered in my medical decision making (see chart for details).  Clinical Course    Pt does look better after some IVFs.  The pt will be given cipro/flagyl for presumed colitis.  The pt d/w Dr. [facility] (triad) who will admit pt.    Final Clinical Impressions(s) / ED Diagnoses   Final diagnoses:  Weakness  Dehydration  Colitis  Lactic acidosis  Orthostatic hypotension  Fever, unspecified fever cause    New Prescriptions New Prescriptions   No medications on file     Isla Pence, MD 12/11/15 1339

## 2015-12-11 NOTE — ED Notes (Signed)
Patient transported to CT 

## 2015-12-11 NOTE — H&P (Signed)
TRH H&P   Patient Demographics:    Diamond Boyd, is a 75 y.o. female  MRN: [id number]   DOB - 1940/11/14  Admit Date - 12/11/2015  Outpatient Primary MD for the patient is [name], MD  Referring MD/NP/PA: Dr. Gilford Raid  Outpatient Specialists: Isaias Sakai Dr Alvy Bimler  Patient coming from: Home  Chief Complaint  Patient presents with  . Weakness      HPI:    Diamond Boyd  is a 75 y.o. female, With history of colon cancer status post resection, chronic systolic CHF, Hodgkin lymphoma, hypothyroidism, hyperlipidemia, patient presents with complaints of generalized weakness and fatigue, over last 2 weeks, patient currently on IVIG, managed by Dr.Gorsuch for decreased immunity from Hodgkin lymphoma treatment, and chronic maxillary sinusitis, with multiple antibiotic courses recently, patient reports decreased appetite, fatigue, generalized weakness, over last 2 weeks, and fever almost on a daily basis, as well-developed diarrhea, watery over last 48 hours which prompted her to come to ED, she denies any chest pain, abdominal pain, nausea vomiting, coffee-ground emesis, or bright red blood per rectum or melena. - in ED he was febrile 102.9, tachycardic, elevated lactic acid was 3.3, CT abdomen pelvis significant for colitis, and  and troponin 0.04, I was called to admit    Review of systems:    In addition to the HPI above, Reports fever No Headache, No changes with Vision or hearing, reports nasal congestion No problems swallowing food or Liquids, No Chest pain, Cough or Shortness of Breath, No Abdominal pain, No Nausea or Vommitting, reports diarrhea No Blood in stool or Urine, No dysuria, No new skin rashes or bruises, No new joints pains-aches,  No new weakness, tingling, numbness in any extremity, report generalized weakness and fatigue No recent weight gain or loss, No  polyuria, polydypsia or polyphagia, No significant Mental Stressors.  A full 10 point Review of Systems was done, except as stated above, all other Review of Systems were negative.   With Past History of the following :    Past Medical History:  Diagnosis Date  . Anemia    hx blood tx  . Benign essential HTN   . Cancer ([place])    skin cancer- basal cell on arm / colon 2011  . Chronic systolic CHF (congestive heart failure), NYHA class 2 ([place]) 01/2015  . Colon cancer ([place])    2011, s/p surgery, in remission  . Colon polyps   . Degenerative disk disease    Thoracic spine  . Depression   . 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 Generalized weakness 05/11/2015  . GERD (gastroesophageal reflux disease)   . Heart murmur   . hodgkins lymphoma   . Hypothyroidism   . Lymphoma ([place])   . Neuropathy due to chemotherapeutic drug ([place]) 06/19/2014  . NICM (nonischemic  cardiomyopathy) ([place]) 01/2015  . Osteoarthritis    hands  . 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 PAF (paroxysmal atrial fibrillation) ([place])   . Peripheral vascular disease ([place])   . Pneumonia       Past Surgical History:  Procedure Laterality Date  . BONE MARROW BIOPSY  05/27/13  . BREAST BIOPSY  1996  . CARDIAC CATHETERIZATION N/A 01/25/2015   Procedure: Left Heart Cath and Coronary Angiography;  Surgeon: Peter M Martinique, MD;  Location: [facility];  Service: Cardiovascular;  Laterality: N/A;  . CATARACT EXTRACTION, BILATERAL  2016  . CHOLECYSTECTOMY    . COLECTOMY  8/11  . COLONOSCOPY W/ BIOPSIES    . DG BIOPSY LUNG    . LYMPH NODE BIOPSY N/A 11/01/2014   Procedure: MEDIASTINAL LYMPH NODE BIOPSY;  Surgeon: Grace Isaac, MD;  Location: [facility];  Service: Thoracic;  Laterality: N/A;  . MEDIASTINOSCOPY N/A 11/01/2014   Procedure: MEDIASTINOSCOPY;  Surgeon: Grace Isaac, MD;  Location: [facility];  Service: Thoracic;   Laterality: N/A;  . PLANTAR FASCIA RELEASE    . port-a-cath insertion    . SPLENECTOMY     lymphoma  . TENDON RELEASE     Right thumb   . VAGINAL HYSTERECTOMY    . VENTRAL HERNIA REPAIR  1998  . VIDEO BRONCHOSCOPY WITH ENDOBRONCHIAL ULTRASOUND N/A 10/17/2014   Procedure: VIDEO BRONCHOSCOPY WITH ENDOBRONCHIAL ULTRASOUND;  Surgeon: Javier Glazier, MD;  Location: [facility];  Service: Thoracic;  Laterality: N/A;  . VIDEO BRONCHOSCOPY WITH ENDOBRONCHIAL ULTRASOUND N/A 11/01/2014   Procedure: VIDEO BRONCHOSCOPY WITH ENDOBRONCHIAL ULTRASOUND;  Surgeon: Grace Isaac, MD;  Location: [facility];  Service: Thoracic;  Laterality: N/A;      Social History:     Social History  Substance Use Topics  . Smoking status: Never Smoker  . Smokeless tobacco: Never Used     Comment: Second-hand exposure through father.  . Alcohol use No     Lives - At home  Mobility - independent   Family History :     Family History  Problem Relation Age of Onset  . Coronary artery disease Mother   . Diabetes Mother   . Fibromyalgia Daughter     chronic pain   . COPD Daughter   . Diabetes Brother   . Asthma Daughter   . Rheum arthritis Brother   . Clotting disorder Brother   . Alcohol abuse Sister   . Colon cancer Neg Hx   . Colon polyps Neg Hx   . Stomach cancer Neg Hx   . Rectal cancer Neg Hx   . Ulcerative colitis Neg Hx   . Crohn's disease Neg Hx      Home Medications:   Prior to Admission medications   Medication Sig Start Date End Date Taking? Authorizing Provider  acetaminophen (TYLENOL) 650 MG CR tablet Take 650 mg by mouth every 8 (eight) hours as needed for pain or fever.    Yes Historical Provider, MD  acyclovir (ZOVIRAX) 400 MG tablet Take 1 tablet (400 mg total) by mouth daily. 10/29/15  Yes Abner Greenspan, MD  Albuterol Sulfate (PROAIR RESPICLICK) 657 (90 Base) MCG/ACT AEPB Inhale 1-2 puffs into the lungs every 6 (six) hours as needed (SOB, wheezing).    Yes Historical Provider, MD    aspirin 81 MG tablet Take 81 mg by mouth daily.   Yes Historical Provider, MD  Calcium Carbonate-Vitamin D (CALCIUM 600+D) 600-400 MG-UNIT per tablet Take 1 tablet by mouth  every evening.    Yes Historical Provider, MD  celecoxib (CELEBREX) 200 MG capsule Take 1 capsule (200 mg total) by mouth daily as needed for moderate pain. Patient taking differently: Take 200 mg by mouth every other day.  10/29/15  Yes Abner Greenspan, MD  Cholecalciferol (VITAMIN D-3) 1000 UNITS CAPS Take 1,000 Units by mouth daily.   Yes Historical Provider, MD  docusate sodium (COLACE) 100 MG capsule Take 100 mg by mouth at bedtime.   Yes Historical Provider, MD  estradiol (ESTRACE) 1 MG tablet Take 1 tablet (1 mg total) by mouth daily. 10/29/15  Yes Marne A Tower, MD  fexofenadine (ALLEGRA) 180 MG tablet TAKE 1 TABLET (180 MG TOTAL) BY MOUTH DAILY. (*OTC) 08/01/15  Yes Historical Provider, MD  FLUoxetine (PROZAC) 20 MG capsule Take 1 capsule (20 mg total) by mouth daily. 10/29/15  Yes Abner Greenspan, MD  fluticasone (FLONASE) 50 MCG/ACT nasal spray Place 2 sprays into both nostrils daily. Patient taking differently: Place 2 sprays into both nostrils 2 (two) times daily.  10/29/15  Yes Marne A Tower, MD  fluticasone furoate-vilanterol (BREO ELLIPTA) 200-25 MCG/INH AEPB Inhale 1 puff into the lungs daily. 11/21/15 12/21/15 Yes Valentina Shaggy, MD  furosemide (LASIX) 20 MG tablet Take 1 tablet (20 mg total) by mouth daily. Take 1 tab by mouth daily for weight gain. 10/29/15  Yes Abner Greenspan, MD  gabapentin (NEURONTIN) 300 MG capsule Take 1 capsule (300 mg total) by mouth 2 (two) times daily. 10/29/15  Yes Abner Greenspan, MD  levothyroxine (SYNTHROID, LEVOTHROID) 112 MCG tablet Take 1 tablet (112 mcg total) by mouth daily before breakfast. 10/29/15  Yes Abner Greenspan, MD  lidocaine-prilocaine (EMLA) cream Apply 1 application topically as needed (For port-a-cath.). Reported on 02/22/2015   Yes Historical Provider, MD  losartan  (COZAAR) 25 MG tablet Take 1 tablet (25 mg total) by mouth daily. 10/29/15  Yes Abner Greenspan, MD  metoprolol (LOPRESSOR) 50 MG tablet TAKE 1 TABLET (50 MG TOTAL) BY MOUTH 2 (TWO) TIMES DAILY. 10/29/15  Yes Abner Greenspan, MD  Omega-3 350 MG CAPS Take 1 capsule by mouth daily. Reported on 02/22/2015   Yes Historical Provider, MD  omeprazole (PRILOSEC) 40 MG capsule Take 1 capsule (40 mg total) by mouth daily. 10/29/15  Yes Abner Greenspan, MD  ondansetron (ZOFRAN) 8 MG tablet Take 8 mg by mouth every 6 (six) hours as needed for nausea or vomiting. Reported on 03/05/2015 12/25/14  Yes Historical Provider, MD  polyethylene glycol (MIRALAX / GLYCOLAX) packet Take 17 g by mouth at bedtime.   Yes Historical Provider, MD  prochlorperazine (COMPAZINE) 10 MG tablet Take 10 mg by mouth every 6 (six) hours as needed for nausea or vomiting. Reported on 03/05/2015 12/25/14  Yes Historical Provider, MD  psyllium (METAMUCIL) 58.6 % packet Take 1-3 packets by mouth at bedtime.    Yes Historical Provider, MD  simvastatin (ZOCOR) 40 MG tablet Take 1 tablet (40 mg total) by mouth at bedtime. 10/29/15  Yes Abner Greenspan, MD  vitamin E (VITAMIN E) 400 UNIT capsule Take 400 Units by mouth daily. Reported on 03/05/2015   Yes Historical Provider, MD  lidocaine (XYLOCAINE) 5 % ointment Apply 1 application topically 3 (three) times daily as needed. To rectal area Patient not taking: Reported on 12/11/2015 12/11/15   Abner Greenspan, MD  mometasone-formoterol [facility]) 200-5 MCG/ACT AERO Inhale 2 puffs into the lungs 2 (two) times daily. Patient not taking: Reported  on 12/11/2015 11/08/15   Valentina Shaggy, MD     Allergies:     Allergies  Allergen Reactions  . Achromycin [Tetracycline Hcl] Other (See Comments)    Pt does not remember reaction  . Allopurinol Other (See Comments)    REACTION: Unsure of reaction happene years ago  . Astelin [Azelastine Hcl] Other (See Comments)    Reaction unknown  . Cephalexin Other (See  Comments)    REACTION: unsure of reaction happened yrs ago.  . Codeine Other (See Comments)    REACTION: abd. pain  . Lisinopril Cough  . Meloxicam Other (See Comments)    REACTION: GI symptoms  . Minocycline Other (See Comments)    Abdominal pain  . Nabumetone Other (See Comments)    REACTION: reaction not known  . Nyquil [Pseudoeph-Doxylamine-Dm-Apap] Hives  . Sulfa Antibiotics Other (See Comments)    Gi side eff   . Zolpidem Tartrate Other (See Comments)    REACTION: feels too drugged  . Buspar [Buspirone Hcl] Other (See Comments)    Dizziness, and not as effective for anxiety  . Ciprofloxacin Rash     Physical Exam:   Vitals  Blood pressure (!) 109/52, pulse 72, temperature 99 F (37.2 C), temperature source Oral, resp. rate 15, height 5' 5"  (1.651 m), weight 106.1 kg (234 lb), last menstrual period 02/10/1970, SpO2 94 %.   1. GeneralWell-developed obese female lying in bed in NAD,    2. Normal affect and insight, Not Suicidal or Homicidal, Awake Alert, Oriented X 3.  3. No F.N deficits, ALL C.Nerves Intact, Strength 5/5 all 4 extremities, Sensation intact all 4 extremities, Plantars down going.  4. Ears and Eyes appear Normal, Conjunctivae clear, PERRLA. Dry Oral Mucosa.  5. Supple Neck, No JVD, No Carotid Bruits.  6. Symmetrical Chest wall movement, Good air movement bilaterally, CTAB.  7. RRR, No Gallops, Rubs or Murmurs, No Parasternal Heave.  8. Positive Bowel Sounds, Abdomen Soft, No tenderness, No rebound -guarding or rigidity.  9.  No Cyanosis, Skin Turgor, No Skin Rash or Bruise.  10. Good muscle tone,  joints appear normal , no effusions, Normal ROM.     Data Review:    CBC  Recent Labs Lab 12/11/15 1008  WBC 10.1  HGB 12.9  HCT 37.2  PLT 25*  MCV 89.2  MCH 30.9  MCHC 34.7  RDW 18.1*  LYMPHSABS 5.4*  MONOABS 2.6*  EOSABS 0.0  BASOSABS 0.0    ------------------------------------------------------------------------------------------------------------------  Chemistries   Recent Labs Lab 12/11/15 1008  NA 132*  K 3.9  CL 100*  CO2 24  GLUCOSE 100*  BUN 18  CREATININE 1.02*  CALCIUM 9.4  AST 67*  ALT 29  ALKPHOS 243*  BILITOT 1.1   ------------------------------------------------------------------------------------------------------------------ estimated creatinine clearance is 56.7 mL/min (by C-G formula based on SCr of 1.02 mg/dL (H)). ------------------------------------------------------------------------------------------------------------------ No results for input(s): TSH, T4TOTAL, T3FREE, THYROIDAB in the last 72 hours.  Invalid input(s): FREET3  Coagulation profile No results for input(s): INR, PROTIME in the last 168 hours. ------------------------------------------------------------------------------------------------------------------- No results for input(s): DDIMER in the last 72 hours. -------------------------------------------------------------------------------------------------------------------  Cardiac Enzymes  Recent Labs Lab 12/11/15 1008  TROPONINI 0.04*   ------------------------------------------------------------------------------------------------------------------    Component Value Date/Time   BNP 622.7 (H) 01/23/2015 1602     ---------------------------------------------------------------------------------------------------------------  Urinalysis    Component Value Date/Time   COLORURINE AMBER (A) 12/11/2015 1008   APPEARANCEUR CLOUDY (A) 12/11/2015 1008   LABSPEC 1.024 12/11/2015 1008   LABSPEC 1.005 01/10/2015 1045  PHURINE 6.5 12/11/2015 1008   GLUCOSEU NEGATIVE 12/11/2015 1008   GLUCOSEU Negative 01/10/2015 1045   HGBUR NEGATIVE 12/11/2015 1008   BILIRUBINUR NEGATIVE 12/11/2015 1008   BILIRUBINUR Negative 01/10/2015 [place] 12/11/2015  1008   PROTEINUR 30 (A) 12/11/2015 1008   UROBILINOGEN 0.2 01/10/2015 1045   NITRITE NEGATIVE 12/11/2015 1008   LEUKOCYTESUR MODERATE (A) 12/11/2015 1008   LEUKOCYTESUR Negative 01/10/2015 1045    ----------------------------------------------------------------------------------------------------------------   Imaging Results:    Dg Chest 2 View  Result Date: 12/11/2015 CLINICAL DATA:  Shortness of breath. EXAM: CHEST  2 VIEW COMPARISON:  Radiographs of July 17, 2015. FINDINGS: The heart size and mediastinal contours are within normal limits. Both lungs are clear. No pneumothorax or pleural effusion is noted. Atherosclerosis of thoracic aorta is noted. Right internal jugular Port-A-Cath is unchanged in position with distal tip in expected position of the SVC. The visualized skeletal structures are unremarkable. IMPRESSION: No active cardiopulmonary disease.  Aortic atherosclerosis. Electronically Signed   By: Marijo Conception, M.D.   On: 12/11/2015 10:33   Ct Abdomen Pelvis W Contrast  Result Date: 12/11/2015 CLINICAL DATA:  75 year old female with diarrhea floor weeks. Fever. Increasing weakness. Rectal pain. History non-Hodgkin's lymphoma 1994, colon cancer 2011 and Hodgkin's disease 2015. Prior chemotherapy. Ongoing IVIG. Post cholecystectomy, splenectomy, hernia repair, colectomy and hysterectomy. Initial encounter. EXAM: CT ABDOMEN AND PELVIS WITH CONTRAST TECHNIQUE: Multidetector CT imaging of the abdomen and pelvis was performed using the standard protocol following bolus administration of intravenous contrast. CONTRAST:  15m ISOVUE-300 IOPAMIDOL (ISOVUE-300) INJECTION 61% COMPARISON:  07/10/2015 PET-CT. FINDINGS: Lower chest: Minimal scarring lung bases. No worrisome lesion. Coronary artery calcifications. Heart size within normal limits. Small epicardial lymph nodes. Hepatobiliary: Elongated liver spanning over 20 cm. No worrisome hepatic lesion noted. Minimal periportal edema. Post  cholecystectomy. Pancreas: No mass or inflammation. Spleen: Post splenectomy. Adrenals/Urinary Tract: No renal or adrenal mass. No renal or ureteral obstructing stone. No hydronephrosis. Stomach/Bowel: Under distended stomach with diffuse wall thickening. This may represent result of under distension and only slightly different than 07/10/2015 exam. Not able to completely exclude underlying gastritis, mass or ulceration. Duodenal diverticulum incidentally noted. Large portion of colon has been resected. Residual colon within the anterior central aspect of the abdomen with thickened walls, new since 07/10/2015 exam and therefore not able to exclude underlying inflammation or mass. No extra luminal bowel inflammatory process, free fluid or free air. Vascular/Lymphatic: Increase number of normal size retroperitoneal lymph nodes. Calcified aorta without aneurysmal dilation. Reproductive: Post hysterectomy.  No ovarian mass. Other: Diastases rectus musculature with scar from abdominal hernia. Bowel extends immediately beneath the operative site but without bowel containing obstructing hernia. Slightly prominent vessels without clear collaterals noted. Slightly dense breast parenchyma incompletely assessed by CT. Partially decompressed bladder with circumferential wall thickening. Musculoskeletal: Probable hemangioma L2. Several lucencies most prominent at L3 and L4 suggestive of involvement by tumor, minimally changed from prior exam. Degenerative changes L3-4 and L4-5. Fusion L5-S1. IMPRESSION: Large portion of colon has been resected. Residual colon within the anterior central aspect of the abdomen with thickened walls, new since 07/10/2015 exam and therefore not able to exclude underlying inflammation or mass. No extra luminal bowel inflammatory process or free air. Under distended stomach with thickened walls minimally changed from prior exam and may be related to underdistention rather than mass or gastritis.  Increased number of normal size retroperitoneal lymph nodes. The number of normal size lymph nodes has increased since prior exam. Increase  number lucencies involving the L3 and L4 vertebral body may represent progressive osseous metastatic disease. Post splenectomy, cholecystectomy and hysterectomy. Elongated liver without focal mass. Aortic atherosclerosis. Electronically Signed   By: Genia Del M.D.   On: 12/11/2015 13:02    My personal review of EKG: pending   Assessment & Plan:    Active Problems:   High cholesterol   Chronic maxillary sinusitis   Hypothyroid   Acute maxillary sinusitis   History of Hodgkin's lymphoma   Elevated liver enzymes   CHF (congestive heart failure) (HCC)   Elevated troponin   Anemia in neoplastic disease   Hodgkin lymphoma (HCC)   Colitis  Generalized weakness and fatigue - This is most likely related to diarrhea, dehydration and colitis - Will start on gentle hydration, will consult PT  Sepsis secondary to colitis - Patient presents with fever, tachycardia, and elevated lactic acid - Abnormal urinalysis, but contaminated specimen, denies any urinary symptoms, chest x-ray with no evidence of infection, follow on blood cultures  Diarrhea/colitis - Patient presents with diarrhea over last week, CT abdomen and pelvis significant for colitis, patient on multiple antibiotic regimen for her sinusitis, high suspicion for C. difficile colitis, will start empirically on oral vancomycin, meanwhile continue with IV levofloxacin and Flagyl given she is immunocompromised pending her workup. - Follow on C. difficile and GI pathogen panel  Hodgkin lymphoma - Management per oncology, discussed with Dr. Alvy Bimler, currently on IVIG for immunosuppression  Chronic systolic heart failure - Echo in 01/21/2015 showing EF of 40-45%, appears to be volume depleted, monitor closely as an IV fluid  Transaminitis - Most likely related to infection, no right upper quadrant  pain, will monitor closely.  History of atrial fibrillation - Does not appear to be any blood thinner, and I would not start with the significant thrombocytopenia, rate controlled on metoprolol.  Thrombocytopenia - Most likely due to sepsis, no evidence of active bleed, no indication for transfusion.  Chronic maxillary sinusitis - This has been followed by ENT as an outpatient, patient received multiple prolonged antibiotic therapies, she is on levofloxacin for colitis, which should provide good coverage for her chronic sinusitis.  Elevated troponins - She denies any chest pain, had recurrent troponin lower than her baseline, will cycle troponins and monitor on telemetry, will obtain EKG. - Continue with home dose aspirin, statin and metoprolol  Hypertension - Pressure acceptable, continue to hold losartan to the patient is more stable, continue with metoprolol  Hypothyroidism - Continue with Synthroid  COPD - No active wheezing, continue with Prelone senna and when necessary albuterol   DVT Prophylaxis  SCDs   AM Labs Ordered, also please review Full Orders  Family Communication: Admission, patients condition and plan of care including tests being ordered have been discussed with the patient Husband, and grandson who indicate understanding and agree with the plan and Code Status.  Code Status Full  Likely DC to : pending further workup.  Condition GUARDED    Consults called: Oncology Dr Alvy Bimler  Admission status: Inpatient  Time spent in minutes : 65 minutes   Auguste Tebbetts M.D on 12/11/2015 at 2:32 PM  Between 7am to 7pm - Pager - [phone]. After 7pm go to [email or web address] - password [facility]  [facility] - Office  [phone]

## 2015-12-11 NOTE — Patient Instructions (Signed)
Go to [facility] ER now

## 2015-12-11 NOTE — Assessment & Plan Note (Addendum)
Fever in 75 yo with hx of lymphoma (getting IVIG tx) as well as chronic sinusitis with malaise/weakness/poor appetite and diarrhea (? From abx or I wonder c diff) Proposed cxr and lab for septic w/u-pt states she would rather to to the ER She is going to go to [facility] ED now by car-husband will take her

## 2015-12-11 NOTE — Progress Notes (Signed)
CRITICAL VALUE ALERT  Critical value received:  Lactic Acid  Date of notification:  12/11/15  Time of notification:  5784  Critical value read back:Yes.    Nurse who received alert:  Tye Savoy RN  MD notified (1st page):  Dr. Denny Levy  Time of first page:  1643  MD notified (2nd page):  Time of second page:  Responding MD:  Dr. Denny Levy  Time MD responded:  1700

## 2015-12-11 NOTE — Progress Notes (Signed)
Pre visit review using our clinic review tool, if applicable. No additional management support is needed unless otherwise documented below in the visit note. 

## 2015-12-11 NOTE — ED Triage Notes (Signed)
Pt presents with c/o weakness. Pt reports she has been feeling like this for months and for weeks, she has not had an appetite and has been unable to eat anything. Pt also reports she has had a sinus infection, been treated with amoxicillin 4 times and is still not feeling well. Pt reports she was sent here by her doctor. Pt is alert and oriented.

## 2015-12-11 NOTE — ED Notes (Signed)
Hospitalist notified that lab called and reported not enough stool for gastrointestinal panel by PCR and stool not liquid enough for c. Diff testing.

## 2015-12-11 NOTE — ED Notes (Signed)
Platelet count of 25, critical result called from lab.

## 2015-12-12 DIAGNOSIS — D801 Nonfamilial hypogammaglobulinemia: Secondary | ICD-10-CM

## 2015-12-12 DIAGNOSIS — C8190 Hodgkin lymphoma, unspecified, unspecified site: Secondary | ICD-10-CM

## 2015-12-12 DIAGNOSIS — D696 Thrombocytopenia, unspecified: Secondary | ICD-10-CM

## 2015-12-12 DIAGNOSIS — K529 Noninfective gastroenteritis and colitis, unspecified: Secondary | ICD-10-CM

## 2015-12-12 DIAGNOSIS — I5032 Chronic diastolic (congestive) heart failure: Secondary | ICD-10-CM

## 2015-12-12 LAB — CLOSTRIDIUM DIFFICILE BY PCR: Toxigenic C. Difficile by PCR: POSITIVE — AB

## 2015-12-12 LAB — GASTROINTESTINAL PANEL BY PCR, STOOL (REPLACES STOOL CULTURE)
ADENOVIRUS F40/41: NOT DETECTED
Astrovirus: NOT DETECTED
CAMPYLOBACTER SPECIES: NOT DETECTED
CRYPTOSPORIDIUM: NOT DETECTED
CYCLOSPORA CAYETANENSIS: NOT DETECTED
ENTEROPATHOGENIC E COLI (EPEC): NOT DETECTED
Entamoeba histolytica: NOT DETECTED
Enteroaggregative E coli (EAEC): NOT DETECTED
Enterotoxigenic E coli (ETEC): NOT DETECTED
Giardia lamblia: NOT DETECTED
Norovirus GI/GII: NOT DETECTED
PLESIMONAS SHIGELLOIDES: NOT DETECTED
ROTAVIRUS A: NOT DETECTED
SALMONELLA SPECIES: NOT DETECTED
SHIGELLA/ENTEROINVASIVE E COLI (EIEC): NOT DETECTED
Sapovirus (I, II, IV, and V): NOT DETECTED
Shiga like toxin producing E coli (STEC): NOT DETECTED
VIBRIO SPECIES: NOT DETECTED
Vibrio cholerae: NOT DETECTED
YERSINIA ENTEROCOLITICA: NOT DETECTED

## 2015-12-12 LAB — TROPONIN I: TROPONIN I: 0.04 ng/mL — AB (ref <0.03)

## 2015-12-12 LAB — CBC
HEMATOCRIT: 31.6 % — AB (ref 36.0–46.0)
HEMOGLOBIN: 10.8 g/dL — AB (ref 12.0–15.0)
MCH: 30.8 pg (ref 26.0–34.0)
MCHC: 34.2 g/dL (ref 30.0–36.0)
MCV: 90 fL (ref 78.0–100.0)
Platelets: 18 10*3/uL — CL (ref 150–400)
RBC: 3.51 MIL/uL — AB (ref 3.87–5.11)
RDW: 18.4 % — ABNORMAL HIGH (ref 11.5–15.5)
WBC: 5.8 10*3/uL (ref 4.0–10.5)

## 2015-12-12 LAB — LACTIC ACID, PLASMA
LACTIC ACID, VENOUS: 3.1 mmol/L — AB (ref 0.5–1.9)
LACTIC ACID, VENOUS: 2.1 mmol/L — AB (ref 0.5–1.9)

## 2015-12-12 LAB — BASIC METABOLIC PANEL
ANION GAP: 8 (ref 5–15)
BUN: 15 mg/dL (ref 6–20)
CHLORIDE: 104 mmol/L (ref 101–111)
CO2: 23 mmol/L (ref 22–32)
Calcium: 8.5 mg/dL — ABNORMAL LOW (ref 8.9–10.3)
Creatinine, Ser: 0.82 mg/dL (ref 0.44–1.00)
GFR calc non Af Amer: >60 mL/min (ref >60)
GLUCOSE: 86 mg/dL (ref 65–99)
Potassium: 3.7 mmol/L (ref 3.5–5.1)
Sodium: 135 mmol/L (ref 135–145)

## 2015-12-12 LAB — HEPATIC FUNCTION PANEL
ALK PHOS: 205 U/L — AB (ref 38–126)
ALT: 28 U/L (ref 14–54)
AST: 68 U/L — ABNORMAL HIGH (ref 15–41)
Albumin: 2.6 g/dL — ABNORMAL LOW (ref 3.5–5.0)
BILIRUBIN DIRECT: 0.3 mg/dL (ref 0.1–0.5)
BILIRUBIN INDIRECT: 0.9 mg/dL (ref 0.3–0.9)
BILIRUBIN TOTAL: 1.2 mg/dL (ref 0.3–1.2)
TOTAL PROTEIN: 5.2 g/dL — AB (ref 6.5–8.1)

## 2015-12-12 LAB — C DIFFICILE QUICK SCREEN W PCR REFLEX
C Diff antigen: POSITIVE — AB
C Diff toxin: NEGATIVE

## 2015-12-12 MED ORDER — SODIUM CHLORIDE 0.9 % IV BOLUS (SEPSIS)
500.0000 mL | Freq: Once | INTRAVENOUS | Status: AC
  Administered 2015-12-12: 500 mL via INTRAVENOUS

## 2015-12-12 MED ORDER — ADULT MULTIVITAMIN W/MINERALS CH
1.0000 | ORAL_TABLET | Freq: Every day | ORAL | Status: DC
  Administered 2015-12-12 – 2015-12-16 (×5): 1 via ORAL
  Filled 2015-12-12 (×5): qty 1

## 2015-12-12 NOTE — Evaluation (Signed)
Physical Therapy Evaluation Patient Details Name: Diamond Boyd MRN: [id number] DOB: Feb 06, 1941 Today's Date: 12/12/2015   History of Present Illness  75 y.o. female with history of colon cancer status post resection, chronic systolic CHF, Hodgkin lymphoma, hypothyroidism, hyperlipidemia and patient presents with complaints of generalized weakness and fatigue, over last 2 weeks, patient currently on IVIG, admitted with sepsis due ro colitis  Clinical Impression  Pt admitted with above diagnosis. Pt currently with functional limitations due to the deficits listed below (see PT Problem List).  Pt will benefit from skilled PT to increase their independence and safety with mobility to allow discharge to the venue listed below.   Pt ambulated in hallway, distance to tolerance.  Pt reports feeling generalized weakness for past 3 weeks and has not been out in community.  Recommended HHPT and pt agreeable.     Follow Up Recommendations Home health PT    Equipment Recommendations  None recommended by PT    Recommendations for Other Services       Precautions / Restrictions Precautions Precautions: None      Mobility  Bed Mobility Overal bed mobility: Needs Assistance Bed Mobility: Supine to Sit;Sit to Supine     Supine to sit: Supervision Sit to supine: Supervision   General bed mobility comments: supervision for lines only  Transfers Overall transfer level: Needs assistance Equipment used: None Transfers: Sit to/from Stand Sit to Stand: Min guard            Ambulation/Gait Ambulation/Gait assistance: Min guard Ambulation Distance (Feet): 180 Feet Assistive device: None Gait Pattern/deviations: Step-through pattern;Decreased stride length     General Gait Details: pt pushed IV pole, distance to tolerance  Stairs            Wheelchair Mobility    Modified Rankin (Stroke Patients Only)       Balance Overall balance assessment: No apparent balance  deficits (not formally assessed)                                           Pertinent Vitals/Pain Pain Assessment: No/denies pain    Home Living Family/patient expects to be discharged to:: Private residence Living Arrangements: Spouse/significant other   Type of Home: House       Home Layout: One level Home Equipment: Environmental consultant - 2 wheels;Cane - single point      Prior Function Level of Independence: Independent               Hand Dominance        Extremity/Trunk Assessment               Lower Extremity Assessment: Generalized weakness         Communication   Communication: No difficulties  Cognition Arousal/Alertness: Awake/alert Behavior During Therapy: WFL for tasks assessed/performed Overall Cognitive Status: Within Functional Limits for tasks assessed                      General Comments      Exercises     Assessment/Plan    PT Assessment Patient needs continued PT services  PT Problem List Decreased strength;Decreased activity tolerance;Decreased mobility          PT Treatment Interventions DME instruction;Gait training;Functional mobility training;Therapeutic activities;Therapeutic exercise;Patient/family education    PT Goals (Current goals can be found in the Care Plan section)  Acute Rehab  PT Goals PT Goal Formulation: With patient Time For Goal Achievement: 12/19/15 Potential to Achieve Goals: Good    Frequency Min 3X/week   Barriers to discharge        Co-evaluation               End of Session   Activity Tolerance: Patient tolerated treatment well Patient left: in bed;with call bell/phone within reach;with family/visitor present           Time: 1147-1200 PT Time Calculation (min) (ACUTE ONLY): 13 min   Charges:   PT Evaluation $PT Eval Low Complexity: 1 Procedure     PT G Codes:        Demarrio Menges,KATHrine E 12/12/2015, 12:21 PM Carmelia Bake, PT, DPT 12/12/2015 Pager:  [phone]

## 2015-12-12 NOTE — Progress Notes (Signed)
CRITICAL VALUE ALERT  Critical value received: Lactic acid 2.1  Date of notification:  12/12/15  Time of notification:  2300  Critical value read back: Yes   Nurse who received alert: Elwin Sleight RN   MD notified (1st page):  Tylene Fantasia   Time of first page:  2326  MD notified (2nd page):  Time of second page:  Responding MD:    Time MD responded:

## 2015-12-12 NOTE — Progress Notes (Signed)
Initial Nutrition Assessment  DOCUMENTATION CODES:   Obesity unspecified  INTERVENTION:  - Continue Ensure Enlive po BID, each supplement provides 350 kcal and 20 grams of protein - Will order daily multivitamin with minerals.  - Continue to encourage PO intakes of meals, supplements, and beverages. - RD will continue to monitor for nutrition-related needs.  NUTRITION DIAGNOSIS:   Inadequate oral intake related to poor appetite as evidenced by per patient/family report.  GOAL:   Patient will meet greater than or equal to 90% of their needs  MONITOR:   PO intake, Supplement acceptance, Weight trends, Labs, I & O's  REASON FOR ASSESSMENT:   Malnutrition Screening Tool  ASSESSMENT:   75 y.o. female, With history of colon cancer status post resection, chronic systolic CHF, Hodgkin lymphoma, hypothyroidism, hyperlipidemia, patient presents with complaints of generalized weakness and fatigue, over last 2 weeks, patient currently on IVIG, managed by Dr.Gorsuch for decreased immunity from Hodgkin lymphoma treatment, and chronic maxillary sinusitis, with multiple antibiotic courses recently, patient reports decreased appetite, fatigue, generalized weakness, over last 2 weeks, and fever almost on a daily basis, as well-developed diarrhea, watery over last 48 hours which prompted her to come to ED, she denies any chest pain, abdominal pain, nausea vomiting, coffee-ground emesis, or bright red blood per rectum or melena.  Pt seen for MST. BMI indicates obesity. Per chart review, pt consumed 50% of dinner last night and 80% of breakfast this AM. At time of visit lunch tray was still in pt's room and she had consumed 50-75% of soft fruit plate with cottage cheese. Pt's husband, who is at bedside, states that pt is missing several back teeth and he and pt confirm that she needs softer foods and tender meats d/t this.   Pt reports that she has had a decreased appetite for ~2 months. She states that  she has been on antibiotics for close to a year but that many adjustments to dose and medication prescribed has occurred more recently d/t chronic sinus infections and eventual plan for surgery to sinuses. Pt states that she often does not feel hungry but will sometimes force herself to eat. When she is not able to eat a meal she drinks Ensure; order already in for Ensure Enlive BID. She states that an outpatient MD had encouraged decreased intake of sodium and high sugar-containing items and that she has done well with this recommendation. She mainly drinks water, Ensure, and coffee occasionally but has not increased intake of fluids with diarrhea; pt reports she was told in the ED that she was dehydrated.  Pt denies swallowing difficulties. She denies taste alterations. She denies abdominal pain or nausea with or without PO intakes since admission or PTA.  Physical assessment shows no muscle or fat wasting. Per chart review, pt has lost 10 lbs (4% body weight) in the past 1 month which is not significant for time frame. Pt does not meet criteria for malnutrition at this time.   Medications reviewed; 1 tablet Oscal with D/day, 112 mcg Synthroid/day, PRN Zofran, 40 mg Protonix/day, 400 units vitamin E/day. Labs reviewed; Ca: 8.5 mg/dL, Alk Phos and AST elevated.   IVF: NS @ 75 mL/hr.    Diet Order:  Diet regular Room service appropriate? Yes; Fluid consistency: Thin  Skin:  Reviewed, no issues  Last BM:  11/1  Height:   Ht Readings from Last 1 Encounters:  12/11/15 5' 5"  (1.651 m)    Weight:   Wt Readings from Last 1 Encounters:  12/12/15 235 lb 7.2 oz (106.8 kg)    Ideal Body Weight:  56.82 kg  BMI:  Body mass index is 39.18 kg/m.  Estimated Nutritional Needs:   Kcal:  1600-1815 (15-17 kcal/kg)  Protein:  75-85 grams  Fluid:  >/= 2 L/day  EDUCATION NEEDS:   No education needs identified at this time    Jarome Matin, MS, RD, LDN Inpatient Clinical Dietitian Pager  # [phone] After hours/weekend pager # [phone]

## 2015-12-12 NOTE — Care Management Note (Signed)
Case Management Note  Patient Details  Name: WILEEN DUNCANSON MRN: [id number] Date of Birth: 1941/01/23  Subjective/Objective: Pt recc HHPT, Provided patient w/HHC agency list for choice-patient chose [facility] aware-await HHPT,[id number].                   Action/Plan:d/c home w/HHC.   Expected Discharge Date:   (unknown)               Expected Discharge Plan:  [facility] Referral:     Discharge planning Services  CM Consult  Post Acute Care Choice:  Durable Medical Equipment (Has rw) Choice offered to:  Patient  DME Arranged:    DME Agency:     [facility] [place]     Status of Service:  In process, will continue to follow  If discussed at Long Length of Stay Meetings, dates discussed:    Additional Comments:  MDessa Phi RN 12/12/2015, 3:42 PM

## 2015-12-12 NOTE — Consult Note (Signed)
[facility] NOTE  Patient Care Team: Abner Greenspan, MD as PCP - General Mosetta Anis, MD (Allergy) Fanny Skates, MD as Consulting Physician (General Surgery) Grace Isaac, MD as Consulting Physician (Cardiothoracic Surgery) Troy Sine, MD as Consulting Physician (Cardiology) Lonn Georgia, PA-C as Physician Assistant (Cardiology) [name], MD as Consulting Physician (Hematology and Oncology) Estill Cotta, MD as Consulting Physician (Ophthalmology)  CHIEF COMPLAINTS/PURPOSE OF CONSULTATION:  Severe thrombocytopenia, well-known patient  HISTORY OF PRESENTING ILLNESS:  Diamond Boyd 75 y.o. female is admitted to the hospital due to daily fever, diarrhea and CT findings suggestive of colitis This patient is well-known to me. Summary of oncologic history as follows: Oncology History   History of colon cancer   Staging form: Colon and Rectum, AJCC 7th Edition     Clinical: Stage IIA (T3, N0, M0) - Signed by [name], MD on 02/21/2014 History of hodgkin's lymphoma   Staging form: Lymphoid Neoplasms, AJCC 6th Edition     Clinical: Stage IV - Signed by [name], MD on 02/21/2014       History of Hodgkin's lymphoma   05/20/2013 Initial Diagnosis    Hodgkin lymphoma      06/27/2014 Imaging    Interval increase in metabolic activity of several small lymph nodes is concerning for lymphoma recurrence. Lymph nodes include a small right level 2 lymph node, right hilar lymph node, and right paratracheal lymph node      07/07/2014 Pathology Results     limited tissue from ultrasound-guided biopsy was nondiagnostic      07/07/2014 Procedure     ultrasound-guided biopsy was nondiagnostic      09/27/2014 Imaging    Repeat PET CT scan show diffuse disease concern for relapse.      10/17/2014 Pathology Results    Accession: [id number] Biopsy was nondiagnostic      10/17/2014 Procedure    She underwent bronchoscopy & EBUS & biopsy of  LN at  Level 10L      11/01/2014 Surgery    She underwent cronchoscopy with endobronchial ultrasound, mediastinoscopy and biopsy       11/01/2014 Pathology Results    Accession: [id number] biopsy showed no evidence of lymphoma, only granulomas.      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[place])      12/13/2014 Imaging    PET scan showed diffuse disease      12/28/2014 - 12/30/2014 Hospital Admission    She was admitted to the hospital for cycle one of R-ICE      01/18/2015 - 01/20/2015 Chemotherapy    She was admitted to the hospital for cycle 2 of R-ICE      01/20/2015 - 01/22/2015 Hospital Admission    She was then admitted to [facility] with respiratory failure/fluid overload and was  noted to have mild cardiomyopathy      01/23/2015 - 01/27/2015 Hospital Admission    she was admitted to the hospital with new onset atrial fibrillation with rapid ventricular response and was found to have evidence of myocardial ischemia. She was discharged on medical management and oral anticoagulation therapy      01/25/2015 Procedure    Left heart cath showed non-obstructive lesions: Prox RCA lesion, 40% stenosed.   Prox LAD to Mid LAD lesion, 30% stenosed. Ost LM lesion, 25% stenosed      01/30/2015 Imaging    PET Ct scan showed near complete resolution of all disease      03/12/2015 - 09/10/2015 Chemotherapy    She received 3  doses of Rituximab every other month      07/10/2015 PET scan    PET showed Improved metabolic activity in the bony lesions with mild persistent hilar LN with increased SUV uptake      09/06/2015 Imaging    Ct scan showed interval development of mild mucosal thickening involving left maxillary and bilateral ethmoid sinuses consistent with mild sinusitis.      She has not been receiving chemotherapy. 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 Anemia    hx blood tx  . Benign essential HTN   . Cancer ([place])    skin cancer- basal cell on arm / colon 2011  . Chronic systolic CHF (congestive heart failure), NYHA class 2 ([place]) 01/2015  . Colon cancer ([place])    2011, s/p surgery, in remission  . Colon polyps   . Degenerative disk disease    Thoracic spine  . Depression   . 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 Generalized weakness 05/11/2015  . GERD (gastroesophageal reflux disease)   . Heart murmur   . hodgkins lymphoma   . Hypothyroidism   . Lymphoma ([place])   . Neuropathy due to chemotherapeutic drug ([place]) 06/19/2014  . NICM (nonischemic cardiomyopathy) ([place]) 01/2015  . Osteoarthritis    hands  . 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 PAF (paroxysmal atrial fibrillation) ([place])   . Peripheral vascular disease ([place])   . Pneumonia     SURGICAL HISTORY: Past Surgical History:  Procedure  Laterality Date  . BONE MARROW BIOPSY  05/27/13  . BREAST BIOPSY  1996  . CARDIAC CATHETERIZATION N/A 01/25/2015   Procedure: Left Heart Cath and Coronary Angiography;  Surgeon: Peter M Martinique, MD;  Location: [facility];  Service: Cardiovascular;  Laterality: N/A;  . CATARACT EXTRACTION, BILATERAL  2016  . CHOLECYSTECTOMY    . COLECTOMY  8/11  . COLONOSCOPY W/ BIOPSIES    . DG BIOPSY LUNG    . LYMPH NODE BIOPSY N/A 11/01/2014   Procedure: MEDIASTINAL LYMPH NODE BIOPSY;  Surgeon: Grace Isaac, MD;  Location: [facility];  Service: Thoracic;  Laterality: N/A;  . MEDIASTINOSCOPY N/A 11/01/2014   Procedure: MEDIASTINOSCOPY;  Surgeon: Grace Isaac,  MD;  Location: [facility];  Service: Thoracic;  Laterality: N/A;  . PLANTAR FASCIA RELEASE    . port-a-cath insertion    . SPLENECTOMY     lymphoma  . TENDON RELEASE     Right thumb   . VAGINAL HYSTERECTOMY    . VENTRAL HERNIA REPAIR  1998  . VIDEO BRONCHOSCOPY WITH ENDOBRONCHIAL ULTRASOUND N/A 10/17/2014   Procedure: VIDEO BRONCHOSCOPY WITH ENDOBRONCHIAL ULTRASOUND;  Surgeon: Javier Glazier, MD;  Location: [facility];  Service: Thoracic;  Laterality: N/A;  . VIDEO BRONCHOSCOPY WITH ENDOBRONCHIAL ULTRASOUND N/A 11/01/2014   Procedure: VIDEO BRONCHOSCOPY WITH ENDOBRONCHIAL ULTRASOUND;  Surgeon: Grace Isaac, MD;  Location: [facility];  Service: Thoracic;  Laterality: N/A;    SOCIAL HISTORY: Social History   Social History  . Marital status: Married    Spouse name: N/A  . Number of children: 2  . Years of education: N/A   Occupational History  . Retired  Retired   Social History Main Topics  . Smoking status: Never Smoker  . Smokeless tobacco: Never Used     Comment: Second-hand exposure through father.  . Alcohol use No  . Drug use: No  . Sexual activity: No   Other Topics Concern  . Not on file   Social History Narrative   Originally from [place]. Always lived in [place]. Previously has traveled to [facility], [place] & up [employer] to [place].  No international travel. No pets currently. Remote parakeet exposure with her children. 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    Lives at home with husband. Weakness due to chemo, but otherwise independant       FAMILY HISTORY: Family History  Problem Relation Age of Onset  . Coronary artery disease Mother   . Diabetes Mother   . Fibromyalgia Daughter     chronic pain   . COPD Daughter   . Diabetes Brother   . Asthma Daughter   . Rheum arthritis Brother   . Clotting disorder Brother   . Alcohol abuse Sister   . Colon cancer Neg Hx   . Colon polyps Neg Hx   . Stomach cancer Neg Hx   . Rectal cancer Neg Hx   . Ulcerative colitis Neg Hx   . Crohn's disease Neg Hx     ALLERGIES:  is allergic to achromycin [tetracycline hcl]; allopurinol; astelin [azelastine hcl]; cephalexin; codeine; lisinopril; meloxicam; minocycline; nabumetone; nyquil [pseudoeph-doxylamine-dm-apap]; sulfa antibiotics; zolpidem tartrate; buspar [buspirone hcl]; and ciprofloxacin.  MEDICATIONS:  Current Facility-Administered Medications  Medication Dose Route Frequency Provider Last Rate Last Dose  . 0.9 %  sodium chloride infusion   Intravenous Continuous Isla Pence, MD   Stopped at 12/11/15 1634  . 0.9 %  sodium chloride infusion   Intravenous Continuous Albertine Patricia, MD 75 mL/hr at 12/12/15 1058    . acetaminophen (TYLENOL) tablet 650 mg  650 mg Oral Q6H PRN Albertine Patricia, MD   650 mg at 12/12/15 0630   Or  . acetaminophen (TYLENOL) suppository 650 mg  650 mg Rectal Q6H PRN Albertine Patricia, MD      . acyclovir (ZOVIRAX) tablet 400 mg  400 mg Oral Daily Albertine Patricia, MD   400 mg at 12/12/15 1138  . aspirin chewable tablet 81 mg  81 mg Oral Daily Albertine Patricia, MD   81 mg at 12/12/15 1137  . calcium-vitamin D (OSCAL WITH D) 500-200 MG-UNIT per tablet 1 tablet  1 tablet Oral QPM Dawood S Elgergawy,  MD   1 tablet at 12/11/15 1809  . estradiol (ESTRACE) tablet 1 mg  1  mg Oral Daily Albertine Patricia, MD   1 mg at 12/12/15 1138  . feeding supplement (ENSURE ENLIVE) (ENSURE ENLIVE) liquid 237 mL  237 mL Oral BID BM Silver Huguenin Elgergawy, MD   237 mL at 12/11/15 1640  . FLUoxetine (PROZAC) capsule 20 mg  20 mg Oral Daily Albertine Patricia, MD   20 mg at 12/12/15 1137  . fluticasone (FLONASE) 50 MCG/ACT nasal spray 2 spray  2 spray Each Nare Daily Albertine Patricia, MD   2 spray at 12/12/15 1139  . fluticasone furoate-vilanterol (BREO ELLIPTA) 200-25 MCG/INH 1 puff  1 puff Inhalation Daily Albertine Patricia, MD   1 puff at 12/12/15 0925  . gabapentin (NEURONTIN) capsule 300 mg  300 mg Oral BID Albertine Patricia, MD   300 mg at 12/12/15 1136  . levothyroxine (SYNTHROID, LEVOTHROID) tablet 112 mcg  112 mcg Oral QAC breakfast Albertine Patricia, MD   112 mcg at 12/12/15 0811  . loratadine (CLARITIN) tablet 10 mg  10 mg Oral Daily Albertine Patricia, MD   10 mg at 12/12/15 1136  . metoprolol tartrate (LOPRESSOR) tablet 25 mg  25 mg Oral BID Albertine Patricia, MD   25 mg at 12/12/15 1137  . omega-3 acid ethyl esters (LOVAZA) capsule 1 g  1 capsule Oral Daily Albertine Patricia, MD   1 g at 12/12/15 1136  . ondansetron (ZOFRAN) tablet 4 mg  4 mg Oral Q6H PRN Albertine Patricia, MD       Or  . ondansetron (ZOFRAN) injection 4 mg  4 mg Intravenous Q6H PRN Silver Huguenin Elgergawy, MD      . pantoprazole (PROTONIX) EC tablet 40 mg  40 mg Oral Daily Albertine Patricia, MD   40 mg at 12/12/15 1137  . piperacillin-tazobactam (ZOSYN) IVPB 3.375 g  3.375 g Intravenous Q8H Thomes Lolling, RPH   3.375 g at 12/12/15 0017  . prochlorperazine (COMPAZINE) tablet 10 mg  10 mg Oral Q6H PRN Albertine Patricia, MD      . simvastatin (ZOCOR) tablet 40 mg  40 mg Oral QHS Albertine Patricia, MD   40 mg at 12/11/15 2125  . sodium chloride flush (NS) 0.9 % injection 10-40 mL  10-40 mL Intracatheter PRN Albertine Patricia, MD      . vancomycin (VANCOCIN) 50 mg/mL oral solution 125 mg  125 mg  Oral Q6H Albertine Patricia, MD   125 mg at 12/12/15 0811  . vitamin E capsule 400 Units  400 Units Oral Daily Albertine Patricia, MD   400 Units at 12/12/15 1138    REVIEW OF SYSTEMS:   Constitutional: Denies fevers, chills or abnormal night sweats Eyes: Denies blurriness of vision, double vision or watery eyes Ears, nose, mouth, throat, and face: Denies mucositis or sore throat Respiratory: Denies cough, dyspnea or wheezes Cardiovascular: Denies palpitation, chest discomfort or lower extremity swelling Skin: Denies abnormal skin rashes Lymphatics: Denies new lymphadenopathy or easy bruising Neurological:Denies numbness, tingling or new weaknesses Behavioral/Psych: Mood is stable, no new changes  All other systems were reviewed with the patient and are negative.  PHYSICAL EXAMINATION: ECOG PERFORMANCE STATUS: 2 - Symptomatic, <50% confined to bed  Vitals:   12/11/15 2102 12/12/15 0613  BP: (!) 107/50 (!) 127/52  Pulse: 75 79  Resp:  20  Temp: 99.2 F (37.3 C) 100.3 F (37.9  C)   Filed Weights   12/11/15 0934 12/11/15 1530 12/12/15 0613  Weight: 234 lb (106.1 kg) 235 lb 11.2 oz (106.9 kg) 235 lb 7.2 oz (106.8 kg)    GENERAL:alert, no distress and comfortable. She is moderately obese, pale SKIN: skin color, texture, turgor are normal, no rashes or significant lesions EYES: normal, conjunctiva are pink and non-injected, sclera clear OROPHARYNX:no exudate, no erythema and lips, buccal mucosa, and tongue normal  NECK: supple, thyroid normal size, non-tender, without nodularity LYMPH:  no palpable lymphadenopathy in the cervical, axillary or inguinal LUNGS: clear to auscultation and percussion with normal breathing effort HEART: regular rate & rhythm and no murmurs and no lower extremity edema ABDOMEN:abdomen soft, non-tender and normal bowel sounds. No rebound tenderness or guarding Musculoskeletal:no cyanosis of digits and no clubbing  PSYCH: alert & oriented x 3 with fluent  speech NEURO: no focal motor/sensory deficits  LABORATORY DATA:  I have reviewed the data as listed Lab Results  Component Value Date   WBC 5.8 12/12/2015   HGB 10.8 (L) 12/12/2015   HCT 31.6 (L) 12/12/2015   MCV 90.0 12/12/2015   PLT 18 (LL) 12/12/2015    Recent Labs  01/27/15 0625  10/24/15 1102 12/11/15 1008 12/12/15 0345  NA 137  < > 136 132* 135  K 3.7  < > 3.9 3.9 3.7  CL 109  < > 102 100* 104  CO2 23  < > 29 24 23   GLUCOSE 90  < > 91 100* 86  BUN 11  < > 12 18 15   CREATININE 1.01*  < > 0.94 1.02* 0.82  CALCIUM 9.3  < > 9.1 9.4 8.5*  GFRNONAA 53*  --   --  52* >60  GFRAA >60  --   --  >60 >60  PROT  --   < > 7.5 6.2* 5.2*  ALBUMIN  --   < > 3.5 3.2* 2.6*  AST  --   < > 37 67* 68*  ALT  --   < > 18 29 28   ALKPHOS  --   < > 169* 243* 205*  BILITOT  --   < > 0.5 1.1 1.2  BILIDIR  --   --   --   --  0.3  IBILI  --   --   --   --  0.9  < > = values in this interval not displayed.  RADIOGRAPHIC STUDIES: I have personally reviewed the radiological images as listed and agreed with the findings in the report. Dg Chest 2 View  Result Date: 12/11/2015 CLINICAL DATA:  Shortness of breath. EXAM: CHEST  2 VIEW COMPARISON:  Radiographs of July 17, 2015. FINDINGS: The heart size and mediastinal contours are within normal limits. Both lungs are clear. No pneumothorax or pleural effusion is noted. Atherosclerosis of thoracic aorta is noted. Right internal jugular Port-A-Cath is unchanged in position with distal tip in expected position of the SVC. The visualized skeletal structures are unremarkable. IMPRESSION: No active cardiopulmonary disease.  Aortic atherosclerosis. Electronically Signed   By: Marijo Conception, M.D.   On: 12/11/2015 10:33   Ct Abdomen Pelvis W Contrast  Result Date: 12/11/2015 CLINICAL DATA:  75 year old female with diarrhea floor weeks. Fever. Increasing weakness. Rectal pain. History non-Hodgkin's lymphoma 1994, colon cancer 2011 and Hodgkin's disease 2015.  Prior chemotherapy. Ongoing IVIG. Post cholecystectomy, splenectomy, hernia repair, colectomy and hysterectomy. Initial encounter. EXAM: CT ABDOMEN AND PELVIS WITH CONTRAST TECHNIQUE: Multidetector CT imaging of the abdomen and  pelvis was performed using the standard protocol following bolus administration of intravenous contrast. CONTRAST:  165m ISOVUE-300 IOPAMIDOL (ISOVUE-300) INJECTION 61% COMPARISON:  07/10/2015 PET-CT. FINDINGS: Lower chest: Minimal scarring lung bases. No worrisome lesion. Coronary artery calcifications. Heart size within normal limits. Small epicardial lymph nodes. Hepatobiliary: Elongated liver spanning over 20 cm. No worrisome hepatic lesion noted. Minimal periportal edema. Post cholecystectomy. Pancreas: No mass or inflammation. Spleen: Post splenectomy. Adrenals/Urinary Tract: No renal or adrenal mass. No renal or ureteral obstructing stone. No hydronephrosis. Stomach/Bowel: Under distended stomach with diffuse wall thickening. This may represent result of under distension and only slightly different than 07/10/2015 exam. Not able to completely exclude underlying gastritis, mass or ulceration. Duodenal diverticulum incidentally noted. Large portion of colon has been resected. Residual colon within the anterior central aspect of the abdomen with thickened walls, new since 07/10/2015 exam and therefore not able to exclude underlying inflammation or mass. No extra luminal bowel inflammatory process, free fluid or free air. Vascular/Lymphatic: Increase number of normal size retroperitoneal lymph nodes. Calcified aorta without aneurysmal dilation. Reproductive: Post hysterectomy.  No ovarian mass. Other: Diastases rectus musculature with scar from abdominal hernia. Bowel extends immediately beneath the operative site but without bowel containing obstructing hernia. Slightly prominent vessels without clear collaterals noted. Slightly dense breast parenchyma incompletely assessed by CT.  Partially decompressed bladder with circumferential wall thickening. Musculoskeletal: Probable hemangioma L2. Several lucencies most prominent at L3 and L4 suggestive of involvement by tumor, minimally changed from prior exam. Degenerative changes L3-4 and L4-5. Fusion L5-S1. IMPRESSION: Large portion of colon has been resected. Residual colon within the anterior central aspect of the abdomen with thickened walls, new since 07/10/2015 exam and therefore not able to exclude underlying inflammation or mass. No extra luminal bowel inflammatory process or free air. Under distended stomach with thickened walls minimally changed from prior exam and may be related to underdistention rather than mass or gastritis. Increased number of normal size retroperitoneal lymph nodes. The number of normal size lymph nodes has increased since prior exam. Increase number lucencies involving the L3 and L4 vertebral body may represent progressive osseous metastatic disease. Post splenectomy, cholecystectomy and hysterectomy. Elongated liver without focal mass. Aortic atherosclerosis. Electronically Signed   By: SGenia DelM.D.   On: 12/11/2015 13:02    ASSESSMENT & PLAN:   Acute colitis With recurrent courses of antibiotic therapy, I'm concerned about possible undiagnosed Clostridium difficile infection Further workup is pending She is currently on isolation  Acute thrombocytopenia Could be related to recent infection and consumption from bleeding I recommend platelet transfusion if she have recurrent bleeding again all platelet count less than 10,000  History of hodgkin's lymphoma Recent PET CT scan from 07/10/2015 show significant improvement compared to prior PET scan. There were nonspecific activity in her bone and the left hilar of indeterminate etiology is. She is not symptomatic. I recommend close observation with repeat imaging study in 6 months, due around November 2017. Se is not a candidate for bone marrow  transplant due to recent issue fibrillation and mild cardiomyopathy from treatment. The patient was getting maintenance rituximab but developed acquired panhypogammaglobulinemia. Her treatment is placed on hold. CT imaging study yesterday show possibly disease recurrence I would recommend follow-up PET/CT scan as discussed in the outpatient setting  Chronic systolic CHF (congestive heart failure), NYHA class 2 ([place] She has no evidence of fluid overload or recent exacerbation of congestive heart failure. 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 I discussed the importance of discontinuation of rituximab and to consider monthly IVIG treatment. The risks, benefit, side effects of IVIG treatment including risk of allergic reaction, hives, itching, serum sickness were discussed and she agreed to proceed. I would dose her at 1 g/kg once every 4 weeks She has recently received IVIG 2 weeks ago  Recurrent sinus infection Follow-up with ENT outpatient  Physical deconditioning She has generalized deconditioning. She had home physical therapist but was not making progress. The patient does not exercise on her own because she felt fatigue. I encouraged the patient to increase physical activity as tolerated and will get PT involved once she is improving  Protein calorie malnutrition, moderate She has protein calorie non-nutrition likely due to recent infection and colitis We'll consult dietitian while hospitalized  Discharge planning She is not ready to be discharged until fever resolves I will return tomorrow to check on her Continue maximum supportive care  All questions were answered. The patient knows to call the clinic with any problems, questions or concerns.    [name],  MD 12/12/2015 12:36 PM

## 2015-12-12 NOTE — Progress Notes (Signed)
Patient had one soft, blood streaked bowel movement overnight.  Stool not liquid enough to send C-diff.  Will continue to monitor patient.

## 2015-12-12 NOTE — Progress Notes (Signed)
PROGRESS NOTE [facility]   Diamond Boyd   YQM:[id number] DOB: [birth year]  DOA: 12/11/2015 PCP: [name], MD   Brief Narrative:  Diamond Boyd  is a 75 y.o. female, With history of colon cancer status post resection, chronic systolic CHF, Hodgkin lymphoma, hypothyroidism, hyperlipidemia, patient presents with complaints of generalized weakness and fatigue, over last 2 weeks, patient currently on IVIG, managed by Dr.Gorsuch for decreased immunity from Hodgkin lymphoma treatment, and chronic maxillary sinusitis, with multiple antibiotic courses recently, patient reports decreased appetite, fatigue, generalized weakness, over last 2 weeks, and fever almost on a daily basis, as well-developed diarrhea, watery over last 48 hours which prompted her to come to ED, she denies any chest pain, abdominal pain, nausea vomiting, coffee-ground emesis, or bright red blood per rectum or melena. - in ED he was febrile 102.9, tachycardic, elevated lactic acid was 3.3, CT abdomen pelvis significant for colitis, and  and troponin 0.04, Admitted to r/o C diff and and IV abx.   Subjective: Patient doing well this morning, pain has improved, no watery diarrhea but continues with frequent loose stools. Tolerating diet well.   Assessment & Plan:  Sepsis secondary to colitis - Patient presents with fever, tachycardia, and elevated lactic acid - Abnormal urinalysis, but contaminated specimen, denies any urinary symptoms, chest x-ray with no evidence of infection, follow on blood cultures  Diarrhea/colitis - Patient presents with diarrhea over last week, CT abdomen and pelvis significant for colitis, patient on multiple antibiotic regimen for her sinusitis, high suspicion for C. difficile colitis - Continue empiric oral vancomycin, switched to Zosyn today - Follow on C. Difficile - has not been able to get sample -given stool well formed  - GI panel negative for other pathogens   Hodgkin  lymphoma - Management per oncology, discussed with Dr. Alvy Bimler, currently on IVIG for immunosuppression - recommendations appreciated.   Chronic systolic heart failure - Echo in 01/21/2015 showing EF of 40-45%, appears to be volume depleted, monitor closely as an IV fluid  Transaminitis - Most likely related to infection, no right upper quadrant pain, will monitor closely.  Thrombocytopenia - Most likely due to sepsis, no evidence of active bleed, no indication for transfusion. - Hold a/c    Chronic maxillary sinusitis - This has been followed by ENT as an outpatient, patient received multiple prolonged antibiotic therapies, she is on Zosyn for colitis, which should provide good coverage for her chronic sinusitis.  Elevated troponins - likely new baseline asymptomatic, - Monitor EKG and tele  - Continue with home dose aspirin, statin and metoprolol  Hypertension -stable  -Continue home meds -Monitor BP -Losartan on hold   Hypothyroidism - Continue with Synthroid  DVT prophylaxis: SCD's hold a/c given thrombocytopenia  Code Status: Full  Family Communication: None at bedside Disposition Plan: Anticipate discharge to previous environment when medically stable   Consultants:   Oncology   Procedures:   None   Antimicrobials:  Oral Vanco 10/31  Levo 10/31 OTO   Flagyl 10/31 OTO  Zosyn 11/1   Objective: Vitals:   12/11/15 2102 12/12/15 0613 12/12/15 0926 12/12/15 1300  BP: (!) 107/50 (!) 127/52  125/61  Pulse: 75 79  74  Resp:  20  18  Temp: 99.2 F (37.3 C) 100.3 F (37.9 C)  98.9 F (37.2 C)  TempSrc: Oral Oral  Oral  SpO2: 98% 96% 95% 96%  Weight:  106.8 kg (235 lb 7.2 oz)    Height:  Intake/Output Summary (Last 24 hours) at 12/12/15 1650 Last data filed at 12/12/15 0900  Gross per 24 hour  Intake             1725 ml  Output                0 ml  Net             1725 ml   Filed Weights   12/11/15 0934 12/11/15 1530 12/12/15 0613   Weight: 106.1 kg (234 lb) 106.9 kg (235 lb 11.2 oz) 106.8 kg (235 lb 7.2 oz)    Examination:  General exam: Appears calm and comfortable  HEENT: AC/AT, PERRLA, OP moist and clear Respiratory system: Clear to auscultation. No wheezes,crackle or rhonchi Cardiovascular system: S1 & S2 heard, RRR. No JVD, murmurs, rubs or gallops Gastrointestinal system: Abdomen is nondistended, soft and nontender. No organomegaly or masses felt. Normal bowel sounds heard. Central nervous system: Alert and oriented. No focal neurological deficits. Extremities: No pedal edema. Symmetric, strength 5/5   Skin: No rashes, lesions or ulcers Psychiatry: Judgement and insight appear normal. Mood & affect appropriate.    Data Reviewed: I have personally reviewed following labs and imaging studies  CBC:  Recent Labs Lab 12/11/15 1008 12/12/15 0345  WBC 10.1 5.8  NEUTROABS 2.1  --   HGB 12.9 10.8*  HCT 37.2 31.6*  MCV 89.2 90.0  PLT 25* 18*   Basic Metabolic Panel:  Recent Labs Lab 12/11/15 1008 12/12/15 0345  NA 132* 135  K 3.9 3.7  CL 100* 104  CO2 24 23  GLUCOSE 100* 86  BUN 18 15  CREATININE 1.02* 0.82  CALCIUM 9.4 8.5*   GFR: Estimated Creatinine Clearance: 70.9 mL/min (by C-G formula based on SCr of 0.82 mg/dL). Liver Function Tests:  Recent Labs Lab 12/11/15 1008 12/12/15 0345  AST 67* 68*  ALT 29 28  ALKPHOS 243* 205*  BILITOT 1.1 1.2  PROT 6.2* 5.2*  ALBUMIN 3.2* 2.6*   No results for input(s): LIPASE, AMYLASE in the last 168 hours. No results for input(s): AMMONIA in the last 168 hours. Coagulation Profile: No results for input(s): INR, PROTIME in the last 168 hours. Cardiac Enzymes:  Recent Labs Lab 12/11/15 1008 12/11/15 1558 12/11/15 2220 12/12/15 0345  TROPONINI 0.04* 0.04* 0.04* 0.04*   BNP (last 3 results) No results for input(s): PROBNP in the last 8760 hours. HbA1C: No results for input(s): HGBA1C in the last 72 hours. CBG: No results for input(s):  GLUCAP in the last 168 hours. Lipid Profile: No results for input(s): CHOL, HDL, LDLCALC, TRIG, CHOLHDL, LDLDIRECT in the last 72 hours. Thyroid Function Tests: No results for input(s): TSH, T4TOTAL, FREET4, T3FREE, THYROIDAB in the last 72 hours. Anemia Panel: No results for input(s): VITAMINB12, FOLATE, FERRITIN, TIBC, IRON, RETICCTPCT in the last 72 hours. Sepsis Labs:  Recent Labs Lab 12/11/15 1042 12/11/15 1558 12/11/15 1900  PROCALCITON  --  0.28  --   LATICACIDVEN 3.32* 2.1* 2.2*    Recent Results (from the past 240 hour(s))  Blood culture (routine x 2)     Status: None (Preliminary result)   Collection Time: 12/11/15 11:39 AM  Result Value Ref Range Status   Specimen Description BLOOD RIGHT ANTECUBITAL  Final   Special Requests BOTTLES DRAWN AEROBIC AND ANAEROBIC 5CC  Final   Culture   Final    NO GROWTH 1 DAY Performed at [facility]    Report Status PENDING  Incomplete  Blood culture (  routine x 2)     Status: None (Preliminary result)   Collection Time: 12/11/15 12:38 PM  Result Value Ref Range Status   Specimen Description BLOOD LEFT ANTECUBITAL  Final   Special Requests BOTTLES DRAWN AEROBIC AND ANAEROBIC 5ML  Final   Culture   Final    NO GROWTH 1 DAY Performed at [facility]    Report Status PENDING  Incomplete  Gastrointestinal Panel by PCR , Stool     Status: None   Collection Time: 12/12/15  2:41 AM  Result Value Ref Range Status   Campylobacter species NOT DETECTED NOT DETECTED Final   Plesimonas shigelloides NOT DETECTED NOT DETECTED Final   Salmonella species NOT DETECTED NOT DETECTED Final   Yersinia enterocolitica NOT DETECTED NOT DETECTED Final   Vibrio species NOT DETECTED NOT DETECTED Final   Vibrio cholerae NOT DETECTED NOT DETECTED Final   Enteroaggregative E coli (EAEC) NOT DETECTED NOT DETECTED Final   Enteropathogenic E coli (EPEC) NOT DETECTED NOT DETECTED Final   Enterotoxigenic E coli (ETEC) NOT DETECTED NOT DETECTED  Final   Shiga like toxin producing E coli (STEC) NOT DETECTED NOT DETECTED Final   Shigella/Enteroinvasive E coli (EIEC) NOT DETECTED NOT DETECTED Final   Cryptosporidium NOT DETECTED NOT DETECTED Final   Cyclospora cayetanensis NOT DETECTED NOT DETECTED Final   Entamoeba histolytica NOT DETECTED NOT DETECTED Final   Giardia lamblia NOT DETECTED NOT DETECTED Final   Adenovirus F40/41 NOT DETECTED NOT DETECTED Final   Astrovirus NOT DETECTED NOT DETECTED Final   Norovirus GI/GII NOT DETECTED NOT DETECTED Final   Rotavirus A NOT DETECTED NOT DETECTED Final   Sapovirus (I, II, IV, and V) NOT DETECTED NOT DETECTED Final     Radiology Studies: Dg Chest 2 View  Result Date: 12/11/2015 CLINICAL DATA:  Shortness of breath. EXAM: CHEST  2 VIEW COMPARISON:  Radiographs of July 17, 2015. FINDINGS: The heart size and mediastinal contours are within normal limits. Both lungs are clear. No pneumothorax or pleural effusion is noted. Atherosclerosis of thoracic aorta is noted. Right internal jugular Port-A-Cath is unchanged in position with distal tip in expected position of the SVC. The visualized skeletal structures are unremarkable. IMPRESSION: No active cardiopulmonary disease.  Aortic atherosclerosis. Electronically Signed   By: Marijo Conception, M.D.   On: 12/11/2015 10:33   Ct Abdomen Pelvis W Contrast  Result Date: 12/11/2015 CLINICAL DATA:  75 year old female with diarrhea floor weeks. Fever. Increasing weakness. Rectal pain. History non-Hodgkin's lymphoma 1994, colon cancer 2011 and Hodgkin's disease 2015. Prior chemotherapy. Ongoing IVIG. Post cholecystectomy, splenectomy, hernia repair, colectomy and hysterectomy. Initial encounter. EXAM: CT ABDOMEN AND PELVIS WITH CONTRAST TECHNIQUE: Multidetector CT imaging of the abdomen and pelvis was performed using the standard protocol following bolus administration of intravenous contrast. CONTRAST:  125m ISOVUE-300 IOPAMIDOL (ISOVUE-300) INJECTION 61%  COMPARISON:  07/10/2015 PET-CT. FINDINGS: Lower chest: Minimal scarring lung bases. No worrisome lesion. Coronary artery calcifications. Heart size within normal limits. Small epicardial lymph nodes. Hepatobiliary: Elongated liver spanning over 20 cm. No worrisome hepatic lesion noted. Minimal periportal edema. Post cholecystectomy. Pancreas: No mass or inflammation. Spleen: Post splenectomy. Adrenals/Urinary Tract: No renal or adrenal mass. No renal or ureteral obstructing stone. No hydronephrosis. Stomach/Bowel: Under distended stomach with diffuse wall thickening. This may represent result of under distension and only slightly different than 07/10/2015 exam. Not able to completely exclude underlying gastritis, mass or ulceration. Duodenal diverticulum incidentally noted. Large portion of colon has been resected. Residual colon within the  anterior central aspect of the abdomen with thickened walls, new since 07/10/2015 exam and therefore not able to exclude underlying inflammation or mass. No extra luminal bowel inflammatory process, free fluid or free air. Vascular/Lymphatic: Increase number of normal size retroperitoneal lymph nodes. Calcified aorta without aneurysmal dilation. Reproductive: Post hysterectomy.  No ovarian mass. Other: Diastases rectus musculature with scar from abdominal hernia. Bowel extends immediately beneath the operative site but without bowel containing obstructing hernia. Slightly prominent vessels without clear collaterals noted. Slightly dense breast parenchyma incompletely assessed by CT. Partially decompressed bladder with circumferential wall thickening. Musculoskeletal: Probable hemangioma L2. Several lucencies most prominent at L3 and L4 suggestive of involvement by tumor, minimally changed from prior exam. Degenerative changes L3-4 and L4-5. Fusion L5-S1. IMPRESSION: Large portion of colon has been resected. Residual colon within the anterior central aspect of the abdomen with  thickened walls, new since 07/10/2015 exam and therefore not able to exclude underlying inflammation or mass. No extra luminal bowel inflammatory process or free air. Under distended stomach with thickened walls minimally changed from prior exam and may be related to underdistention rather than mass or gastritis. Increased number of normal size retroperitoneal lymph nodes. The number of normal size lymph nodes has increased since prior exam. Increase number lucencies involving the L3 and L4 vertebral body may represent progressive osseous metastatic disease. Post splenectomy, cholecystectomy and hysterectomy. Elongated liver without focal mass. Aortic atherosclerosis. Electronically Signed   By: Genia Del M.D.   On: 12/11/2015 13:02    Scheduled Meds: . acyclovir  400 mg Oral Daily  . aspirin  81 mg Oral Daily  . calcium-vitamin D  1 tablet Oral QPM  . estradiol  1 mg Oral Daily  . feeding supplement (ENSURE ENLIVE)  237 mL Oral BID BM  . FLUoxetine  20 mg Oral Daily  . fluticasone  2 spray Each Nare Daily  . fluticasone furoate-vilanterol  1 puff Inhalation Daily  . gabapentin  300 mg Oral BID  . levothyroxine  112 mcg Oral QAC breakfast  . loratadine  10 mg Oral Daily  . metoprolol  25 mg Oral BID  . multivitamin with minerals  1 tablet Oral Daily  . omega-3 acid ethyl esters  1 capsule Oral Daily  . pantoprazole  40 mg Oral Daily  . piperacillin-tazobactam (ZOSYN)  IV  3.375 g Intravenous Q8H  . simvastatin  40 mg Oral QHS  . vancomycin  125 mg Oral Q6H  . vitamin E  400 Units Oral Daily   Continuous Infusions: . sodium chloride Stopped (12/11/15 1634)  . sodium chloride 75 mL/hr at 12/12/15 1058     LOS: 1 day    Chipper Oman, MD [facility] Pager [phone]  If 7PM-7AM, please contact night-coverage [email or web address] Password [email or web address] 12/12/2015, 4:50 PM

## 2015-12-12 NOTE — Progress Notes (Signed)
CRITICAL VALUE ALERT  Critical value received: Lactic acid 3.1  Date of notification:  12/12/15  Time of notification:  2000  Critical value read back: Yes   Nurse who received alert: Elwin Sleight RN   MD notified (1st page):  Tylene Fantasia   Time of first page:  2006  MD notified (2nd page):  Time of second page:  Responding MD:    Time MD responded:

## 2015-12-13 DIAGNOSIS — A0472 Enterocolitis due to Clostridium difficile, not specified as recurrent: Secondary | ICD-10-CM

## 2015-12-13 LAB — CBC
HCT: 33.6 % — ABNORMAL LOW (ref 36.0–46.0)
HEMOGLOBIN: 11.4 g/dL — AB (ref 12.0–15.0)
MCH: 30.7 pg (ref 26.0–34.0)
MCHC: 33.9 g/dL (ref 30.0–36.0)
MCV: 90.6 fL (ref 78.0–100.0)
PLATELETS: 16 10*3/uL — AB (ref 150–400)
RBC: 3.71 MIL/uL — AB (ref 3.87–5.11)
RDW: 18.4 % — ABNORMAL HIGH (ref 11.5–15.5)
WBC: 7 10*3/uL (ref 4.0–10.5)

## 2015-12-13 LAB — BASIC METABOLIC PANEL
ANION GAP: 7 (ref 5–15)
BUN: 12 mg/dL (ref 6–20)
CHLORIDE: 103 mmol/L (ref 101–111)
CO2: 23 mmol/L (ref 22–32)
Calcium: 8.6 mg/dL — ABNORMAL LOW (ref 8.9–10.3)
Creatinine, Ser: 0.91 mg/dL (ref 0.44–1.00)
GFR, EST NON AFRICAN AMERICAN: 60 mL/min — AB (ref >60)
Glucose, Bld: 125 mg/dL — ABNORMAL HIGH (ref 65–99)
POTASSIUM: 3.3 mmol/L — AB (ref 3.5–5.1)
SODIUM: 133 mmol/L — AB (ref 135–145)

## 2015-12-13 MED ORDER — FUROSEMIDE 10 MG/ML IJ SOLN
40.0000 mg | Freq: Once | INTRAMUSCULAR | Status: AC
  Administered 2015-12-13: 40 mg via INTRAVENOUS
  Filled 2015-12-13: qty 4

## 2015-12-13 MED ORDER — DIPHENHYDRAMINE HCL 25 MG PO CAPS
25.0000 mg | ORAL_CAPSULE | Freq: Once | ORAL | Status: AC
  Administered 2015-12-13: 25 mg via ORAL
  Filled 2015-12-13: qty 1

## 2015-12-13 MED ORDER — RISAQUAD PO CAPS
1.0000 | ORAL_CAPSULE | Freq: Every day | ORAL | Status: DC
  Administered 2015-12-13 – 2015-12-16 (×4): 1 via ORAL
  Filled 2015-12-13 (×4): qty 1

## 2015-12-13 MED ORDER — ALTEPLASE 2 MG IJ SOLR
2.0000 mg | Freq: Once | INTRAMUSCULAR | Status: AC
  Administered 2015-12-13: 2 mg
  Filled 2015-12-13: qty 2

## 2015-12-13 MED ORDER — ALTEPLASE 2 MG IJ SOLR: 2.0000 mg | Freq: Once | INTRAMUSCULAR | Status: DC

## 2015-12-13 MED ORDER — SODIUM CHLORIDE 0.9 % IV SOLN
Freq: Once | INTRAVENOUS | Status: AC
  Administered 2015-12-13: 10:00:00 via INTRAVENOUS

## 2015-12-13 MED ORDER — POTASSIUM CHLORIDE CRYS ER 20 MEQ PO TBCR
40.0000 meq | EXTENDED_RELEASE_TABLET | ORAL | Status: AC
  Administered 2015-12-13 (×2): 40 meq via ORAL
  Filled 2015-12-13 (×2): qty 2

## 2015-12-13 MED ORDER — LEVALBUTEROL HCL 1.25 MG/0.5ML IN NEBU
1.2500 mg | INHALATION_SOLUTION | Freq: Four times a day (QID) | RESPIRATORY_TRACT | Status: DC | PRN
Start: 2015-12-13 — End: 2015-12-16
  Filled 2015-12-13: qty 0.5

## 2015-12-13 MED ORDER — ACETAMINOPHEN 325 MG PO TABS
650.0000 mg | ORAL_TABLET | Freq: Once | ORAL | Status: AC
  Administered 2015-12-13: 650 mg via ORAL
  Filled 2015-12-13: qty 2

## 2015-12-13 NOTE — Assessment & Plan Note (Signed)
Just finished tx with augmentin and planning sinus surgery  Fever and diarrhea worrisome for c diff

## 2015-12-13 NOTE — Progress Notes (Signed)
Diamond Boyd   DOB:1940/08/28   QI#:[id number]    Subjective: She continues to have severe diarrhea with passage of bright red blood per rectum. PCR on stool culture came back positive for C. difficile. She denies severe cramps on nausea  Objective:  Vitals:   12/12/15 1300 12/13/15 0011  BP: 125/61 (!) 135/58  Pulse: 74 81  Resp: 18 16  Temp: 98.9 F (37.2 C) 99.7 F (37.6 C)     Intake/Output Summary (Last 24 hours) at 12/13/15 3875 Last data filed at 12/13/15 0700  Gross per 24 hour  Intake             3095 ml  Output                1 ml  Net             3094 ml    GENERAL:alert, no distress and comfortable. She appears weak SKIN: skin color, texture, turgor are normal, no rashes or significant lesions EYES: normal, Conjunctiva are pink and non-injected, sclera clear OROPHARYNX:no exudate, no erythema and lips, buccal mucosa, and tongue normal  NECK: supple, thyroid normal size, non-tender, without nodularity LYMPH:  no palpable lymphadenopathy in the cervical, axillary or inguinal LUNGS: clear to auscultation and percussion with normal breathing effort HEART: regular rate & rhythm and no murmurs and no lower extremity edema ABDOMEN:abdomen soft, non-tender and normal bowel sounds Musculoskeletal:no cyanosis of digits and no clubbing  NEURO: alert & oriented x 3 with fluent speech, no focal motor/sensory deficits   Labs:  Lab Results  Component Value Date   WBC 5.8 12/12/2015   HGB 10.8 (L) 12/12/2015   HCT 31.6 (L) 12/12/2015   MCV 90.0 12/12/2015   PLT 18 (LL) 12/12/2015   NEUTROABS 2.1 12/11/2015    Lab Results  Component Value Date   NA 135 12/12/2015   K 3.7 12/12/2015   CL 104 12/12/2015   CO2 23 12/12/2015    Studies:  Dg Chest 2 View  Result Date: 12/11/2015 CLINICAL DATA:  Shortness of breath. EXAM: CHEST  2 VIEW COMPARISON:  Radiographs of July 17, 2015. FINDINGS: The heart size and mediastinal contours are within normal limits. Both lungs are  clear. No pneumothorax or pleural effusion is noted. Atherosclerosis of thoracic aorta is noted. Right internal jugular Port-A-Cath is unchanged in position with distal tip in expected position of the SVC. The visualized skeletal structures are unremarkable. IMPRESSION: No active cardiopulmonary disease.  Aortic atherosclerosis. Electronically Signed   By: Marijo Conception, M.D.   On: 12/11/2015 10:33   Ct Abdomen Pelvis W Contrast  Result Date: 12/11/2015 CLINICAL DATA:  75 year old female with diarrhea floor weeks. Fever. Increasing weakness. Rectal pain. History non-Hodgkin's lymphoma 1994, colon cancer 2011 and Hodgkin's disease 2015. Prior chemotherapy. Ongoing IVIG. Post cholecystectomy, splenectomy, hernia repair, colectomy and hysterectomy. Initial encounter. EXAM: CT ABDOMEN AND PELVIS WITH CONTRAST TECHNIQUE: Multidetector CT imaging of the abdomen and pelvis was performed using the standard protocol following bolus administration of intravenous contrast. CONTRAST:  175m ISOVUE-300 IOPAMIDOL (ISOVUE-300) INJECTION 61% COMPARISON:  07/10/2015 PET-CT. FINDINGS: Lower chest: Minimal scarring lung bases. No worrisome lesion. Coronary artery calcifications. Heart size within normal limits. Small epicardial lymph nodes. Hepatobiliary: Elongated liver spanning over 20 cm. No worrisome hepatic lesion noted. Minimal periportal edema. Post cholecystectomy. Pancreas: No mass or inflammation. Spleen: Post splenectomy. Adrenals/Urinary Tract: No renal or adrenal mass. No renal or ureteral obstructing stone. No hydronephrosis. Stomach/Bowel: Under distended stomach with diffuse  wall thickening. This may represent result of under distension and only slightly different than 07/10/2015 exam. Not able to completely exclude underlying gastritis, mass or ulceration. Duodenal diverticulum incidentally noted. Large portion of colon has been resected. Residual colon within the anterior central aspect of the abdomen with  thickened walls, new since 07/10/2015 exam and therefore not able to exclude underlying inflammation or mass. No extra luminal bowel inflammatory process, free fluid or free air. Vascular/Lymphatic: Increase number of normal size retroperitoneal lymph nodes. Calcified aorta without aneurysmal dilation. Reproductive: Post hysterectomy.  No ovarian mass. Other: Diastases rectus musculature with scar from abdominal hernia. Bowel extends immediately beneath the operative site but without bowel containing obstructing hernia. Slightly prominent vessels without clear collaterals noted. Slightly dense breast parenchyma incompletely assessed by CT. Partially decompressed bladder with circumferential wall thickening. Musculoskeletal: Probable hemangioma L2. Several lucencies most prominent at L3 and L4 suggestive of involvement by tumor, minimally changed from prior exam. Degenerative changes L3-4 and L4-5. Fusion L5-S1. IMPRESSION: Large portion of colon has been resected. Residual colon within the anterior central aspect of the abdomen with thickened walls, new since 07/10/2015 exam and therefore not able to exclude underlying inflammation or mass. No extra luminal bowel inflammatory process or free air. Under distended stomach with thickened walls minimally changed from prior exam and may be related to underdistention rather than mass or gastritis. Increased number of normal size retroperitoneal lymph nodes. The number of normal size lymph nodes has increased since prior exam. Increase number lucencies involving the L3 and L4 vertebral body may represent progressive osseous metastatic disease. Post splenectomy, cholecystectomy and hysterectomy. Elongated liver without focal mass. Aortic atherosclerosis. Electronically Signed   By: Genia Del M.D.   On: 12/11/2015 13:02    Assessment & Plan:   Acute colitis, C Diff positive She is currently on isolation I would defer to primary service for selection of  antimicrobial therapy  Acute thrombocytopenia Could be related to recent infection and consumption from bleeding We discussed some of the risks, benefits, and alternatives of platelets transfusions. The patient is symptomatic from low platelet counts with bleeding/at high risk of life-threatening bleeding and the platelet count is critically low.  Some of the side-effects to be expected including risks of transfusion reactions, chills, infection, syndrome of volume overload and risk of hospitalization from various reasons and the patient is willing to proceed and went ahead to sign consent today. I will proceed to give her 1 unit of platelet transfusion  Please continue to monitor her CBC over the weekend Transfuse if she continues to bleed or if platelet count less than 10,000  History of hodgkin's lymphoma Recent PET CT scan from 07/10/2015 showed significant improvement compared to prior PET scan. There were nonspecific activity in her bone and the left hilar of indeterminate etiology is. She is not symptomatic. I recommend close observation with repeat imaging study in 6 months, due around November 2017. 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 The patient was getting maintenancerituximab but developed acquired panhypogammaglobulinemia. Her treatment is placed on hold. CT imaging study on 10/30 showed possibly disease recurrence I would recommend follow-up PET/CT scan as discussed in the outpatient setting  Chronic systolic CHF (congestive heart failure), NYHA class 2 ([place]) She has no evidence of fluid overload or recent exacerbation of congestive heart failure. 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 She had home physical therapist but was not making progress. The patient does not exercise on her own because she felt fatigue. I encouraged the patient to increase physical activity as tolerated and will get PT involved once she is improving  Protein calorie malnutrition, moderate She has protein calorie non-nutrition likely due to recent infection and colitis We'll consult dietitian while hospitalized  Discharge planning She is not ready to be discharged until diarrhea resolves Continue maximum supportive care I am away Friday and throughout the weekend. I will check on her again next week. If she is discharged before then, she has outpatient follow-up next week.  [name], MD 12/13/2015  8:32 AM

## 2015-12-13 NOTE — Progress Notes (Signed)
Spoke with Dr. Elon Jester regarding PAC continuing to have no blood return despite TPA and port reaccess.  CXR from 10-31 PAC tip in SVC.  Orders received to reTPA port and leave for 4 hours after initial instillation.  Carolee Rota, RN VAST

## 2015-12-13 NOTE — Progress Notes (Addendum)
PROGRESS NOTE [facility]   MARY-ANN PENNELLA   FAO:[id number] DOB: 29-Aug-1940  DOA: 12/11/2015 PCP: [name], MD   Brief Narrative:  Diamond Boyd  is a 75 y.o. female, With history of colon cancer status post resection, chronic systolic CHF, Hodgkin lymphoma, hypothyroidism, hyperlipidemia, patient presents with complaints of generalized weakness and fatigue, over last 2 weeks, patient currently on IVIG, managed by Dr.Gorsuch for decreased immunity from Hodgkin lymphoma treatment, and chronic maxillary sinusitis, with multiple antibiotic courses recently, patient reports decreased appetite, fatigue, generalized weakness, over last 2 weeks, and fever almost on a daily basis, as well-developed diarrhea, watery over last 48 hours which prompted her to come to ED, she denies any chest pain, abdominal pain, nausea vomiting, coffee-ground emesis, or bright red blood per rectum or melena. - in ED he was febrile 102.9, tachycardic, elevated lactic acid was 3.3, CT abdomen pelvis significant for colitis, and  and troponin 0.04, Admitted to r/o C diff and and IV abx.   Subjective: Patient doing better, although continues with loose stool. Patient was positive for C. Difficile. ? Stool with blood  Assessment & Plan:  Sepsis secondary to colitis due to C. difficile - sepsis resolved, afebrile - Abnormal urinalysis, but contaminated specimen, denies any urinary symptoms, chest x-ray with no evidence of infection, follow on blood cultures  Diarrhea/colitis - C. Difficile positive - Patient presents with diarrhea over last week, CT abdomen and pelvis significant for colitis, patient on multiple antibiotic regimen for her sinusitis, high suspicion for C. difficile colitis - Continue oral vancomycin - d/c Zosyn -Discontinue PPI - increase risk for C. Difficile -Add Lactobacilus   Hodgkin lymphoma - Management per oncology, discussed with Dr. Alvy Bimler, currently on IVIG for immunosuppression  - recommendations appreciated.   Chronic systolic heart failure - Echo in 01/21/2015 showing EF of 40-45%, appears to be volume depleted, monitor closely as an IV fluid  Transaminitis - Most likely related to infection, no right upper quadrant pain, will monitor closely.  Thrombocytopenia - Most likely due to sepsis, no evidence of active bleed, no indication for transfusion. - Hold a/c   -Guaiac stool -Per oncology platelet transfusion today  Chronic maxillary sinusitis - This has been followed by ENT as an outpatient, patient received multiple prolonged antibiotic therapies, she is on Zosyn for colitis, which should provide good coverage for her chronic sinusitis.  Elevated troponins - likely new baseline asymptomatic, - Monitor EKG and tele  - Continue with home dose aspirin, statin and metoprolol  Hypertension -stable  -Continue home meds -Monitor BP -Losartan on hold   Hypothyroidism - Continue with Synthroid  DVT prophylaxis: SCD's hold a/c given thrombocytopenia  Code Status: Full  Family Communication: None at bedside Disposition Plan: Anticipate discharge to previous environment when medically stable   Consultants:   Oncology   Procedures:   None   Antimicrobials:  Oral Vanco 10/31  Levo 10/31 OTO   Flagyl 10/31 OTO  Zosyn 11/1   Objective: Vitals:   12/13/15 0834 12/13/15 1100 12/13/15 1145 12/13/15 1300  BP:  (!) 133/52 (!) 132/53 (!) 110/44  Pulse: 70 73 73 66  Resp: '16 16 16 16  '$ Temp:  99.4 F (37.4 C) 98.5 F (36.9 C) 98.5 F (36.9 C)  TempSrc:  Oral Oral Oral  SpO2: 97% 97% 95% 95%  Weight:      Height:        Intake/Output Summary (Last 24 hours) at 12/13/15 1629 Last data filed at 12/13/15 1600  Gross per 24 hour  Intake             2580 ml  Output                5 ml  Net             2575 ml   Filed Weights   12/11/15 1530 12/12/15 0613 12/13/15 0500  Weight: 106.9 kg (235 lb 11.2 oz) 106.8 kg (235 lb 7.2 oz) 110.3  kg (243 lb 3.2 oz)    Examination:  General exam: Appears calm and comfortable  Respiratory system: Clear to auscultation. No wheezes,crackle or rhonchi Cardiovascular system: S1 & S2 heard, RRR. No JVD, murmurs, rubs or gallops Gastrointestinal system: Abdomen is nondistended, soft and nontender. Central nervous system: Alert and oriented.  Extremities: No pedal edema. Skin: No rashes, lesions or ulcers    Data Reviewed: I have personally reviewed following labs and imaging studies  CBC:  Recent Labs Lab 12/11/15 1008 12/12/15 0345 12/13/15 1125  WBC 10.1 5.8 7.0  NEUTROABS 2.1  --   --   HGB 12.9 10.8* 11.4*  HCT 37.2 31.6* 33.6*  MCV 89.2 90.0 90.6  PLT 25* 18* 16*   Basic Metabolic Panel:  Recent Labs Lab 12/11/15 1008 12/12/15 0345 12/13/15 1125  NA 132* 135 133*  K 3.9 3.7 3.3*  CL 100* 104 103  CO2 '24 23 23  '$ GLUCOSE 100* 86 125*  BUN '18 15 12  '$ CREATININE 1.02* 0.82 0.91  CALCIUM 9.4 8.5* 8.6*   GFR: Estimated Creatinine Clearance: 65 mL/min (by C-G formula based on SCr of 0.91 mg/dL). Liver Function Tests:  Recent Labs Lab 12/11/15 1008 12/12/15 0345  AST 67* 68*  ALT 29 28  ALKPHOS 243* 205*  BILITOT 1.1 1.2  PROT 6.2* 5.2*  ALBUMIN 3.2* 2.6*   No results for input(s): LIPASE, AMYLASE in the last 168 hours. No results for input(s): AMMONIA in the last 168 hours. Coagulation Profile: No results for input(s): INR, PROTIME in the last 168 hours. Cardiac Enzymes:  Recent Labs Lab 12/11/15 1008 12/11/15 1558 12/11/15 2220 12/12/15 0345  TROPONINI 0.04* 0.04* 0.04* 0.04*   BNP (last 3 results) No results for input(s): PROBNP in the last 8760 hours. HbA1C: No results for input(s): HGBA1C in the last 72 hours. CBG: No results for input(s): GLUCAP in the last 168 hours. Lipid Profile: No results for input(s): CHOL, HDL, LDLCALC, TRIG, CHOLHDL, LDLDIRECT in the last 72 hours. Thyroid Function Tests: No results for input(s): TSH,  T4TOTAL, FREET4, T3FREE, THYROIDAB in the last 72 hours. Anemia Panel: No results for input(s): VITAMINB12, FOLATE, FERRITIN, TIBC, IRON, RETICCTPCT in the last 72 hours. Sepsis Labs:  Recent Labs Lab 12/11/15 1558 12/11/15 1900 12/12/15 1920 12/12/15 2228  PROCALCITON 0.28  --   --   --   LATICACIDVEN 2.1* 2.2* 3.1* 2.1*    Recent Results (from the past 240 hour(s))  Blood culture (routine x 2)     Status: None (Preliminary result)   Collection Time: 12/11/15 11:39 AM  Result Value Ref Range Status   Specimen Description BLOOD RIGHT ANTECUBITAL  Final   Special Requests BOTTLES DRAWN AEROBIC AND ANAEROBIC 5CC  Final   Culture   Final    NO GROWTH 2 DAYS Performed at [facility]    Report Status PENDING  Incomplete  Blood culture (routine x 2)     Status: None (Preliminary result)   Collection Time: 12/11/15 12:38 PM  Result Value  Ref Range Status   Specimen Description BLOOD LEFT ANTECUBITAL  Final   Special Requests BOTTLES DRAWN AEROBIC AND ANAEROBIC 5ML  Final   Culture   Final    NO GROWTH 2 DAYS Performed at [facility]    Report Status PENDING  Incomplete  Gastrointestinal Panel by PCR , Stool     Status: None   Collection Time: 12/12/15  2:41 AM  Result Value Ref Range Status   Campylobacter species NOT DETECTED NOT DETECTED Final   Plesimonas shigelloides NOT DETECTED NOT DETECTED Final   Salmonella species NOT DETECTED NOT DETECTED Final   Yersinia enterocolitica NOT DETECTED NOT DETECTED Final   Vibrio species NOT DETECTED NOT DETECTED Final   Vibrio cholerae NOT DETECTED NOT DETECTED Final   Enteroaggregative E coli (EAEC) NOT DETECTED NOT DETECTED Final   Enteropathogenic E coli (EPEC) NOT DETECTED NOT DETECTED Final   Enterotoxigenic E coli (ETEC) NOT DETECTED NOT DETECTED Final   Shiga like toxin producing E coli (STEC) NOT DETECTED NOT DETECTED Final   Shigella/Enteroinvasive E coli (EIEC) NOT DETECTED NOT DETECTED Final    Cryptosporidium NOT DETECTED NOT DETECTED Final   Cyclospora cayetanensis NOT DETECTED NOT DETECTED Final   Entamoeba histolytica NOT DETECTED NOT DETECTED Final   Giardia lamblia NOT DETECTED NOT DETECTED Final   Adenovirus F40/41 NOT DETECTED NOT DETECTED Final   Astrovirus NOT DETECTED NOT DETECTED Final   Norovirus GI/GII NOT DETECTED NOT DETECTED Final   Rotavirus A NOT DETECTED NOT DETECTED Final   Sapovirus (I, II, IV, and V) NOT DETECTED NOT DETECTED Final  C difficile quick scan w PCR reflex     Status: Abnormal   Collection Time: 12/12/15  5:46 PM  Result Value Ref Range Status   C Diff antigen POSITIVE (A) NEGATIVE Final   C Diff toxin NEGATIVE NEGATIVE Final   C Diff interpretation Results are indeterminate. See PCR results.  Final  Clostridium Difficile by PCR     Status: Abnormal   Collection Time: 12/12/15  5:46 PM  Result Value Ref Range Status   Toxigenic C Difficile by pcr POSITIVE (A) NEGATIVE Final    Comment: Positive for toxigenic C. difficile with little to no toxin production. Only treat if clinical presentation suggests symptomatic illness. Performed at [facility]      Radiology Studies: No results found.  Scheduled Meds: . acidophilus  1 capsule Oral Daily  . acyclovir  400 mg Oral Daily  . aspirin  81 mg Oral Daily  . calcium-vitamin D  1 tablet Oral QPM  . estradiol  1 mg Oral Daily  . feeding supplement (ENSURE ENLIVE)  237 mL Oral BID BM  . FLUoxetine  20 mg Oral Daily  . fluticasone  2 spray Each Nare Daily  . fluticasone furoate-vilanterol  1 puff Inhalation Daily  . gabapentin  300 mg Oral BID  . levothyroxine  112 mcg Oral QAC breakfast  . loratadine  10 mg Oral Daily  . metoprolol  25 mg Oral BID  . multivitamin with minerals  1 tablet Oral Daily  . omega-3 acid ethyl esters  1 capsule Oral Daily  . simvastatin  40 mg Oral QHS  . vancomycin  125 mg Oral Q6H  . vitamin E  400 Units Oral Daily   Continuous Infusions: . sodium  chloride 10 mL/hr at 12/13/15 1206     LOS: 2 days    Chipper Oman, MD [facility] Pager [phone]  If 7PM-7AM, please contact  night-coverage [email or web address] Password [email or web address] 12/13/2015, 4:29 PM

## 2015-12-13 NOTE — Progress Notes (Signed)
Spoke with Dr. Alvy Bimler regarding PAC without blood return.  Clarify TpA standing order due to new order for platelets and bright red blood in stool per note.  Approval given , ordered per protocol.  Sphia Rn notified and will consult VAS Team once ready for tpa dwell time.

## 2015-12-14 DIAGNOSIS — I10 Essential (primary) hypertension: Secondary | ICD-10-CM

## 2015-12-14 LAB — BASIC METABOLIC PANEL
ANION GAP: 8 (ref 5–15)
BUN: 10 mg/dL (ref 6–20)
CALCIUM: 8.7 mg/dL — AB (ref 8.9–10.3)
CO2: 27 mmol/L (ref 22–32)
Chloride: 100 mmol/L — ABNORMAL LOW (ref 101–111)
Creatinine, Ser: 0.75 mg/dL (ref 0.44–1.00)
GLUCOSE: 87 mg/dL (ref 65–99)
POTASSIUM: 3.5 mmol/L (ref 3.5–5.1)
Sodium: 135 mmol/L (ref 135–145)

## 2015-12-14 LAB — CBC
HCT: 33.4 % — ABNORMAL LOW (ref 36.0–46.0)
Hemoglobin: 11.3 g/dL — ABNORMAL LOW (ref 12.0–15.0)
MCH: 30.4 pg (ref 26.0–34.0)
MCHC: 33.8 g/dL (ref 30.0–36.0)
MCV: 89.8 fL (ref 78.0–100.0)
Platelets: 38 K/uL — ABNORMAL LOW (ref 150–400)
RBC: 3.72 MIL/uL — ABNORMAL LOW (ref 3.87–5.11)
RDW: 18.5 % — ABNORMAL HIGH (ref 11.5–15.5)
WBC: 8.4 K/uL (ref 4.0–10.5)

## 2015-12-14 LAB — PREPARE PLATELET PHERESIS: UNIT DIVISION: 0

## 2015-12-14 LAB — OCCULT BLOOD X 1 CARD TO LAB, STOOL: FECAL OCCULT BLD: POSITIVE — AB

## 2015-12-14 LAB — MAGNESIUM: MAGNESIUM: 1.9 mg/dL (ref 1.7–2.4)

## 2015-12-14 MED ORDER — ALUM & MAG HYDROXIDE-SIMETH 200-200-20 MG/5ML PO SUSP
15.0000 mL | Freq: Four times a day (QID) | ORAL | Status: DC | PRN
  Administered 2015-12-14: 15 mL via ORAL
  Filled 2015-12-14: qty 30

## 2015-12-14 MED ORDER — VANCOMYCIN 50 MG/ML ORAL SOLUTION
250.0000 mg | Freq: Four times a day (QID) | ORAL | Status: DC
  Filled 2015-12-14: qty 5

## 2015-12-14 MED ORDER — VANCOMYCIN 50 MG/ML ORAL SOLUTION
500.0000 mg | Freq: Four times a day (QID) | ORAL | Status: DC
  Administered 2015-12-14 – 2015-12-16 (×9): 500 mg via ORAL
  Filled 2015-12-14 (×10): qty 10

## 2015-12-14 NOTE — Progress Notes (Signed)
PROGRESS NOTE [facility]   ALIVIYA SCHOELLER   UKG:[id number] DOB: Sep 27, 1940  DOA: 12/11/2015 PCP: [name], MD   Brief Narrative:  Diamond Boyd  is a 75 y.o. female, With history of colon cancer status post resection, chronic systolic CHF, Hodgkin lymphoma, hypothyroidism, hyperlipidemia, patient presents with complaints of generalized weakness and fatigue, over last 2 weeks, patient currently on IVIG, managed by Dr.Gorsuch for decreased immunity from Hodgkin lymphoma treatment, and chronic maxillary sinusitis, with multiple antibiotic courses recently, patient reports decreased appetite, fatigue, generalized weakness, over last 2 weeks, and fever almost on a daily basis, as well-developed diarrhea, watery over last 48 hours which prompted her to come to ED, she denies any chest pain, abdominal pain, nausea vomiting, coffee-ground emesis, or bright red blood per rectum or melena. - in ED he was febrile 102.9, tachycardic, elevated lactic acid was 3.3, CT abdomen pelvis significant for colitis, and  and troponin 0.04,  Admitted with C diff colitis, on oral vanco  Subjective: Continues to have multiple loose stools, No abdominal pain, tolerating diet well.   Assessment & Plan:  Sepsis secondary to colitis due to C. difficile - sepsis resolved, afebrile -Abnormal urinalysis, but contaminated specimen, denies any urinary symptoms, chest x-ray with no evidence of infection, follow on blood cultures  Diarrhea/colitis - C. Difficile positive - Patient presents with diarrhea over last week, CT abdomen and pelvis significant for colitis, patient on multiple antibiotic regimen for her sinusitis, high suspicion for C. difficile colitis - Continue oral vancomycin will increase dose as improvement has been minimal, in no improvement by tomorrow will add fidaxomycin  -Discontinue PPI - increase risk for C. Difficile -Add Lactobacilus   Hodgkin lymphoma -Management per oncology,  discussed with Dr. Alvy Bimler, currently on IVIG for immunosuppression - recommendations appreciated.   Chronic systolic heart failure - stable  -Echo in 01/21/2015 showing EF of 40-45%, appears to be volume depleted, monitor closely as an IV fluid  Transaminitis -Most likely related to infection, no right upper quadrant pain, will monitor closely.  Thrombocytopenia s/p 1 unit of platelets  -Most likely due to sepsis, no evidence of active bleed, no indication for transfusion. -Hold a/c   -Guaiac stools  Chronic maxillary sinusitis -This has been followed by ENT as an outpatient, patient received multiple prolonged antibiotic therapies -ENT as outpatient   Elevated troponins - likely new baseline asymptomatic, -Monitor EKG and tele  -Continue with home dose aspirin, statin and metoprolol  Hypertension -stable  -Continue home meds -Monitor BP -Losartan on hold   Hypothyroidism -Continue with Synthroid  DVT prophylaxis: SCD's hold a/c given thrombocytopenia  Code Status: Full  Family Communication: None at bedside Disposition Plan: Anticipate discharge to previous environment when medically stable   Consultants:   Oncology   Procedures:   None   Antimicrobials:  Oral Vanco 10/31  Levo 10/31 OTO   Flagyl 10/31 OTO  Zosyn 11/1   Objective: Vitals:   12/14/15 0512 12/14/15 0811 12/14/15 1045 12/14/15 1400  BP: (!) 125/56  (!) 127/55 136/62  Pulse: 74  79 80  Resp: '16  18 18  '$ Temp: 98.7 F (37.1 C)  99 F (37.2 C) 99.2 F (37.3 C)  TempSrc: Oral  Oral Oral  SpO2: 94%  93% 94%  Weight:  107.5 kg (237 lb)    Height:        Intake/Output Summary (Last 24 hours) at 12/14/15 1559 Last data filed at 12/14/15 0907  Gross per 24 hour  Intake  636.5 ml  Output              762 ml  Net           -125.5 ml   Filed Weights   12/12/15 0613 12/13/15 0500 12/14/15 0811  Weight: 106.8 kg (235 lb 7.2 oz) 110.3 kg (243 lb 3.2 oz) 107.5 kg (237 lb)      Examination:  General exam: Appears calm and comfortable  Respiratory system: Clear to auscultation. No wheezes,crackle or rhonchi Cardiovascular system: S1 & S2 heard, RRR. No JVD, murmurs, rubs or gallops Gastrointestinal system: Abdomen is nondistended, soft and nontender. Central nervous system: Alert and oriented.  Extremities: No pedal edema. Skin: No rashes, lesions or ulcers    Data Reviewed: I have personally reviewed following labs and imaging studies  CBC:  Recent Labs Lab 12/11/15 1008 12/12/15 0345 12/13/15 1125 12/14/15 0500  WBC 10.1 5.8 7.0 8.4  NEUTROABS 2.1  --   --   --   HGB 12.9 10.8* 11.4* 11.3*  HCT 37.2 31.6* 33.6* 33.4*  MCV 89.2 90.0 90.6 89.8  PLT 25* 18* 16* 38*   Basic Metabolic Panel:  Recent Labs Lab 12/11/15 1008 12/12/15 0345 12/13/15 1125 12/14/15 0500  NA 132* 135 133* 135  K 3.9 3.7 3.3* 3.5  CL 100* 104 103 100*  CO2 '24 23 23 27  '$ GLUCOSE 100* 86 125* 87  BUN '18 15 12 10  '$ CREATININE 1.02* 0.82 0.91 0.75  CALCIUM 9.4 8.5* 8.6* 8.7*  MG  --   --   --  1.9   GFR: Estimated Creatinine Clearance: 72.9 mL/min (by C-G formula based on SCr of 0.75 mg/dL). Liver Function Tests:  Recent Labs Lab 12/11/15 1008 12/12/15 0345  AST 67* 68*  ALT 29 28  ALKPHOS 243* 205*  BILITOT 1.1 1.2  PROT 6.2* 5.2*  ALBUMIN 3.2* 2.6*   No results for input(s): LIPASE, AMYLASE in the last 168 hours. No results for input(s): AMMONIA in the last 168 hours. Coagulation Profile: No results for input(s): INR, PROTIME in the last 168 hours. Cardiac Enzymes:  Recent Labs Lab 12/11/15 1008 12/11/15 1558 12/11/15 2220 12/12/15 0345  TROPONINI 0.04* 0.04* 0.04* 0.04*   BNP (last 3 results) No results for input(s): PROBNP in the last 8760 hours. HbA1C: No results for input(s): HGBA1C in the last 72 hours. CBG: No results for input(s): GLUCAP in the last 168 hours. Lipid Profile: No results for input(s): CHOL, HDL, LDLCALC, TRIG,  CHOLHDL, LDLDIRECT in the last 72 hours. Thyroid Function Tests: No results for input(s): TSH, T4TOTAL, FREET4, T3FREE, THYROIDAB in the last 72 hours. Anemia Panel: No results for input(s): VITAMINB12, FOLATE, FERRITIN, TIBC, IRON, RETICCTPCT in the last 72 hours. Sepsis Labs:  Recent Labs Lab 12/11/15 1558 12/11/15 1900 12/12/15 1920 12/12/15 2228  PROCALCITON 0.28  --   --   --   LATICACIDVEN 2.1* 2.2* 3.1* 2.1*    Recent Results (from the past 240 hour(s))  Blood culture (routine x 2)     Status: None (Preliminary result)   Collection Time: 12/11/15 11:39 AM  Result Value Ref Range Status   Specimen Description BLOOD RIGHT ANTECUBITAL  Final   Special Requests BOTTLES DRAWN AEROBIC AND ANAEROBIC 5CC  Final   Culture   Final    NO GROWTH 3 DAYS Performed at [facility]    Report Status PENDING  Incomplete  Blood culture (routine x 2)     Status: None (Preliminary result)  Collection Time: 12/11/15 12:38 PM  Result Value Ref Range Status   Specimen Description BLOOD LEFT ANTECUBITAL  Final   Special Requests BOTTLES DRAWN AEROBIC AND ANAEROBIC 5ML  Final   Culture   Final    NO GROWTH 3 DAYS Performed at [facility]    Report Status PENDING  Incomplete  Gastrointestinal Panel by PCR , Stool     Status: None   Collection Time: 12/12/15  2:41 AM  Result Value Ref Range Status   Campylobacter species NOT DETECTED NOT DETECTED Final   Plesimonas shigelloides NOT DETECTED NOT DETECTED Final   Salmonella species NOT DETECTED NOT DETECTED Final   Yersinia enterocolitica NOT DETECTED NOT DETECTED Final   Vibrio species NOT DETECTED NOT DETECTED Final   Vibrio cholerae NOT DETECTED NOT DETECTED Final   Enteroaggregative E coli (EAEC) NOT DETECTED NOT DETECTED Final   Enteropathogenic E coli (EPEC) NOT DETECTED NOT DETECTED Final   Enterotoxigenic E coli (ETEC) NOT DETECTED NOT DETECTED Final   Shiga like toxin producing E coli (STEC) NOT DETECTED NOT  DETECTED Final   Shigella/Enteroinvasive E coli (EIEC) NOT DETECTED NOT DETECTED Final   Cryptosporidium NOT DETECTED NOT DETECTED Final   Cyclospora cayetanensis NOT DETECTED NOT DETECTED Final   Entamoeba histolytica NOT DETECTED NOT DETECTED Final   Giardia lamblia NOT DETECTED NOT DETECTED Final   Adenovirus F40/41 NOT DETECTED NOT DETECTED Final   Astrovirus NOT DETECTED NOT DETECTED Final   Norovirus GI/GII NOT DETECTED NOT DETECTED Final   Rotavirus A NOT DETECTED NOT DETECTED Final   Sapovirus (I, II, IV, and V) NOT DETECTED NOT DETECTED Final  C difficile quick scan w PCR reflex     Status: Abnormal   Collection Time: 12/12/15  5:46 PM  Result Value Ref Range Status   C Diff antigen POSITIVE (A) NEGATIVE Final   C Diff toxin NEGATIVE NEGATIVE Final   C Diff interpretation Results are indeterminate. See PCR results.  Final  Clostridium Difficile by PCR     Status: Abnormal   Collection Time: 12/12/15  5:46 PM  Result Value Ref Range Status   Toxigenic C Difficile by pcr POSITIVE (A) NEGATIVE Final    Comment: Positive for toxigenic C. difficile with little to no toxin production. Only treat if clinical presentation suggests symptomatic illness. Performed at [facility]      Radiology Studies: No results found.  Scheduled Meds: . acidophilus  1 capsule Oral Daily  . acyclovir  400 mg Oral Daily  . aspirin  81 mg Oral Daily  . calcium-vitamin D  1 tablet Oral QPM  . estradiol  1 mg Oral Daily  . feeding supplement (ENSURE ENLIVE)  237 mL Oral BID BM  . FLUoxetine  20 mg Oral Daily  . fluticasone  2 spray Each Nare Daily  . fluticasone furoate-vilanterol  1 puff Inhalation Daily  . gabapentin  300 mg Oral BID  . levothyroxine  112 mcg Oral QAC breakfast  . loratadine  10 mg Oral Daily  . metoprolol  25 mg Oral BID  . multivitamin with minerals  1 tablet Oral Daily  . omega-3 acid ethyl esters  1 capsule Oral Daily  . simvastatin  40 mg Oral QHS  . vancomycin   500 mg Oral Q6H  . vitamin E  400 Units Oral Daily   Continuous Infusions: . sodium chloride 10 mL/hr at 12/13/15 1206     LOS: 3 days    Chipper Oman, MD [facility] Pager [phone]  If 7PM-7AM, please contact night-coverage [email or web address] Password [email or web address] 12/14/2015, 3:59 PM

## 2015-12-14 NOTE — Progress Notes (Signed)
Physical Therapy Treatment Patient Details Name: Diamond Boyd MRN: [id number] DOB: [birth year] Today's Date: 12/30/15    History of Present Illness 75 y.o. female with history of colon cancer status post resection, chronic systolic CHF, Hodgkin lymphoma, hypothyroidism, hyperlipidemia and patient presents with complaints of generalized weakness and fatigue, over last 2 weeks, patient currently on IVIG, admitted with sepsis due to colitis    PT Comments    Pt assisted with ambulating in hallway, provided HHA for more support.  Follow Up Recommendations  Home health PT     Equipment Recommendations  None recommended by PT    Recommendations for Other Services       Precautions / Restrictions Precautions Precaution Comments: enteric    Mobility  Bed Mobility Overal bed mobility: Modified Independent                Transfers Overall transfer level: Needs assistance Equipment used: None Transfers: Sit to/from Stand Sit to Stand: Min guard         General transfer comment: min/guard for safety  Ambulation/Gait Ambulation/Gait assistance: Min guard Ambulation Distance (Feet): 280 Feet Assistive device: 1 person hand held assist Gait Pattern/deviations: Step-through pattern;Decreased stride length Gait velocity: decr   General Gait Details: pt able to increase distance this visit, HHA for better support, SpO2 97% and HR 93 bpm during gait   Stairs            Wheelchair Mobility    Modified Rankin (Stroke Patients Only)       Balance                                    Cognition Arousal/Alertness: Awake/alert Behavior During Therapy: WFL for tasks assessed/performed Overall Cognitive Status: Within Functional Limits for tasks assessed                      Exercises      General Comments        Pertinent Vitals/Pain Pain Assessment: No/denies pain    Home Living                      Prior Function             PT Goals (current goals can now be found in the care plan section) Progress towards PT goals: Progressing toward goals    Frequency    Min 3X/week      PT Plan Current plan remains appropriate    Co-evaluation             End of Session Equipment Utilized During Treatment: Gait belt Activity Tolerance: Patient tolerated treatment well Patient left: in bed;with call bell/phone within reach;with family/visitor present     Time: 1458-1510 PT Time Calculation (min) (ACUTE ONLY): 12 min  Charges:  $Gait Training: 8-22 mins                    G Codes:      Sher Shampine,KATHrine E 30-Dec-2015, 3:25 PM Carmelia Bake, PT, DPT 2015/12/30 Pager: [phone]

## 2015-12-14 NOTE — Care Management Note (Signed)
Case Management Note  Patient Details  Name: Diamond Boyd MRN: [id number] Date of Birth: 1940-10-25  Subjective/Objective:  HHPT orderd-[facility] rep Santiago Glad aware of HHPT order, no d/c today-diarrhea.                   Action/Plan:d/c home w/[facility]HC.   Expected Discharge Date:   (unknown)               Expected Discharge Plan:  [place]  In-House Referral:     Discharge planning Services  CM Consult  Post Acute Care Choice:  Durable Medical Equipment (Has rw) Choice offered to:  Patient  DME Arranged:    DME Agency:     HH Arranged:  PT [place]:  [place]  Status of Service:  Completed, signed off  If discussed at [place] of Stay Meetings, dates discussed:    Additional Comments:  Dessa Phi, RN 12/14/2015, 1:11 PM

## 2015-12-15 DIAGNOSIS — D630 Anemia in neoplastic disease: Secondary | ICD-10-CM

## 2015-12-15 LAB — CBC
HCT: 33.4 % — ABNORMAL LOW (ref 36.0–46.0)
Hemoglobin: 11.3 g/dL — ABNORMAL LOW (ref 12.0–15.0)
MCH: 30.6 pg (ref 26.0–34.0)
MCHC: 33.8 g/dL (ref 30.0–36.0)
MCV: 90.5 fL (ref 78.0–100.0)
PLATELETS: 29 10*3/uL — AB (ref 150–400)
RBC: 3.69 MIL/uL — AB (ref 3.87–5.11)
RDW: 19 % — ABNORMAL HIGH (ref 11.5–15.5)
WBC: 7.3 10*3/uL (ref 4.0–10.5)

## 2015-12-15 LAB — BASIC METABOLIC PANEL
Anion gap: 8 (ref 5–15)
BUN: 13 mg/dL (ref 6–20)
CO2: 28 mmol/L (ref 22–32)
Calcium: 9 mg/dL (ref 8.9–10.3)
Chloride: 102 mmol/L (ref 101–111)
Creatinine, Ser: 0.69 mg/dL (ref 0.44–1.00)
GFR calc Af Amer: >60 mL/min (ref >60)
GLUCOSE: 104 mg/dL — AB (ref 65–99)
POTASSIUM: 3.8 mmol/L (ref 3.5–5.1)
Sodium: 138 mmol/L (ref 135–145)

## 2015-12-15 LAB — MAGNESIUM: Magnesium: 2.1 mg/dL (ref 1.7–2.4)

## 2015-12-15 NOTE — Progress Notes (Signed)
PROGRESS NOTE [facility]   Diamond Boyd   JSE:[id number] DOB: [birth year]  DOA: 12/11/2015 PCP: [name], MD   Brief Narrative:  Diamond Boyd  is a 75 y.o. female, With history of colon cancer status post resection, chronic systolic CHF, Hodgkin lymphoma, hypothyroidism, hyperlipidemia, patient presents with complaints of generalized weakness and fatigue, over last 2 weeks, patient currently on IVIG, managed by Dr.Gorsuch for decreased immunity from Hodgkin lymphoma treatment, and chronic maxillary sinusitis, with multiple antibiotic courses recently, patient reports decreased appetite, fatigue, generalized weakness, over last 2 weeks, and fever almost on a daily basis, as well-developed diarrhea, watery over last 48 hours which prompted her to come to ED, she denies any chest pain, abdominal pain, nausea vomiting, coffee-ground emesis, or bright red blood per rectum or melena. - in ED he was febrile 102.9, tachycardic, elevated lactic acid was 3.3, CT abdomen pelvis significant for colitis, and  and troponin 0.04,  Admitted with C diff colitis, on oral vanco  Subjective: Patient doing well this morning out of bed, diarrhea have decreased. Had only one bowel movement overnight with more formed stools this morning 1 bowel movement more loose than last night. Otherwise no complaints no acute events overnight.  Assessment & Plan:  Sepsis secondary to colitis due to C. difficile - sepsis resolved, afebrile  -Abnormal urinalysis, but contaminated specimen, denies any urinary symptoms, chest x-ray with no evidence of infection, follow on blood cultures  Diarrhea/colitis - C. Difficile positive - improving diarrhea have decreased - Patient presents with diarrhea over last week, CT abdomen and pelvis significant for colitis, patient on multiple antibiotic regimen for her sinusitis, high suspicion for C. difficile colitis -Continue oral vanc - with the increase in dose patient now  improving  -Discontinue PPI - increase risk for C. Difficile -Lactobacilus   Hodgkin lymphoma -Management per oncology, discussed with Dr. Alvy Bimler, currently on IVIG for immunosuppression - recommendations appreciated.   Chronic systolic heart failure - stable  -Echo in 01/21/2015 showing EF of 40-45%, appears to be volume depleted, monitor closely as an IV fluid  Transaminitis -Most likely related to infection, no right upper quadrant pain, will monitor closely.  Thrombocytopenia s/p 1 unit of platelets - 2/2 infection vs consumption vs Chemo side effects -Hold a/c   -Guaiac positive, no signs of gross bleeding  -Transfuse if platelet < 10,000 or abnormal bleeds occurs  Chronic maxillary sinusitis -This has been followed by ENT as an outpatient, patient received multiple prolonged antibiotic therapies -ENT as outpatient   Elevated troponins - likely new baseline asymptomatic, -Monitor EKG and tele  -Continue with home dose aspirin, statin and metoprolol  Hypertension -stable  -Continue home meds -Monitor BP -Losartan on hold   Hypothyroidism -Continue with Synthroid  DVT prophylaxis: SCD's hold a/c given thrombocytopenia  Code Status: Full  Family Communication: None at bedside Disposition Plan: Anticipate discharge to previous environment when medically stable   Consultants:   Oncology   Procedures:   None   Antimicrobials:  Oral Vanco 10/31  Levo 10/31 OTO   Flagyl 10/31 OTO  Zosyn 11/1   Objective: Vitals:   12/14/15 1400 12/14/15 2109 12/14/15 2139 12/15/15 0436  BP: 136/62 (!) 101/52 (!) 120/54 132/61  Pulse: 80 81 81 70  Resp: '18 18  16  '$ Temp: 99.2 F (37.3 C) 98.9 F (37.2 C)  97.5 F (36.4 C)  TempSrc: Oral Oral  Oral  SpO2: 94% 96%  95%  Weight:    106.5 kg (234 lb  12.6 oz)  Height:    '5\' 4"'$  (1.626 m)    Intake/Output Summary (Last 24 hours) at 12/15/15 1119 Last data filed at 12/15/15 0600  Gross per 24 hour  Intake               490 ml  Output              900 ml  Net             -410 ml   Filed Weights   12/13/15 0500 12/14/15 0811 12/15/15 0436  Weight: 110.3 kg (243 lb 3.2 oz) 107.5 kg (237 lb) 106.5 kg (234 lb 12.6 oz)    Examination:  General exam: Appears calm and comfortable  Respiratory system: Clear to auscultation. No wheezes,crackle or rhonchi Cardiovascular system: S1 & S2 heard, RRR. No JVD, murmurs, rubs or gallops Gastrointestinal system: Abdomen is nondistended, soft and nontender. Central nervous system: Alert and oriented.  Extremities: No pedal edema. Skin: No rashes, lesions or ulcers    Data Reviewed: I have personally reviewed following labs and imaging studies  CBC:  Recent Labs Lab 12/11/15 1008 12/12/15 0345 12/13/15 1125 12/14/15 0500 12/15/15 0417  WBC 10.1 5.8 7.0 8.4 7.3  NEUTROABS 2.1  --   --   --   --   HGB 12.9 10.8* 11.4* 11.3* 11.3*  HCT 37.2 31.6* 33.6* 33.4* 33.4*  MCV 89.2 90.0 90.6 89.8 90.5  PLT 25* 18* 16* 38* 29*   Basic Metabolic Panel:  Recent Labs Lab 12/11/15 1008 12/12/15 0345 12/13/15 1125 12/14/15 0500 12/15/15 0417  NA 132* 135 133* 135 138  K 3.9 3.7 3.3* 3.5 3.8  CL 100* 104 103 100* 102  CO2 '24 23 23 27 28  '$ GLUCOSE 100* 86 125* 87 104*  BUN '18 15 12 10 13  '$ CREATININE 1.02* 0.82 0.91 0.75 0.69  CALCIUM 9.4 8.5* 8.6* 8.7* 9.0  MG  --   --   --  1.9 2.1   GFR: Estimated Creatinine Clearance: 71.2 mL/min (by C-G formula based on SCr of 0.69 mg/dL). Liver Function Tests:  Recent Labs Lab 12/11/15 1008 12/12/15 0345  AST 67* 68*  ALT 29 28  ALKPHOS 243* 205*  BILITOT 1.1 1.2  PROT 6.2* 5.2*  ALBUMIN 3.2* 2.6*   No results for input(s): LIPASE, AMYLASE in the last 168 hours. No results for input(s): AMMONIA in the last 168 hours. Coagulation Profile: No results for input(s): INR, PROTIME in the last 168 hours. Cardiac Enzymes:  Recent Labs Lab 12/11/15 1008 12/11/15 1558 12/11/15 2220 12/12/15 0345    TROPONINI 0.04* 0.04* 0.04* 0.04*   BNP (last 3 results) No results for input(s): PROBNP in the last 8760 hours. HbA1C: No results for input(s): HGBA1C in the last 72 hours. CBG: No results for input(s): GLUCAP in the last 168 hours. Lipid Profile: No results for input(s): CHOL, HDL, LDLCALC, TRIG, CHOLHDL, LDLDIRECT in the last 72 hours. Thyroid Function Tests: No results for input(s): TSH, T4TOTAL, FREET4, T3FREE, THYROIDAB in the last 72 hours. Anemia Panel: No results for input(s): VITAMINB12, FOLATE, FERRITIN, TIBC, IRON, RETICCTPCT in the last 72 hours. Sepsis Labs:  Recent Labs Lab 12/11/15 1558 12/11/15 1900 12/12/15 1920 12/12/15 2228  PROCALCITON 0.28  --   --   --   LATICACIDVEN 2.1* 2.2* 3.1* 2.1*    Recent Results (from the past 240 hour(s))  Blood culture (routine x 2)     Status: None (Preliminary result)   Collection Time:  12/11/15 11:39 AM  Result Value Ref Range Status   Specimen Description BLOOD RIGHT ANTECUBITAL  Final   Special Requests BOTTLES DRAWN AEROBIC AND ANAEROBIC 5CC  Final   Culture   Final    NO GROWTH 3 DAYS Performed at [facility]    Report Status PENDING  Incomplete  Blood culture (routine x 2)     Status: None (Preliminary result)   Collection Time: 12/11/15 12:38 PM  Result Value Ref Range Status   Specimen Description BLOOD LEFT ANTECUBITAL  Final   Special Requests BOTTLES DRAWN AEROBIC AND ANAEROBIC 5ML  Final   Culture   Final    NO GROWTH 3 DAYS Performed at [facility]    Report Status PENDING  Incomplete  Gastrointestinal Panel by PCR , Stool     Status: None   Collection Time: 12/12/15  2:41 AM  Result Value Ref Range Status   Campylobacter species NOT DETECTED NOT DETECTED Final   Plesimonas shigelloides NOT DETECTED NOT DETECTED Final   Salmonella species NOT DETECTED NOT DETECTED Final   Yersinia enterocolitica NOT DETECTED NOT DETECTED Final   Vibrio species NOT DETECTED NOT DETECTED Final    Vibrio cholerae NOT DETECTED NOT DETECTED Final   Enteroaggregative E coli (EAEC) NOT DETECTED NOT DETECTED Final   Enteropathogenic E coli (EPEC) NOT DETECTED NOT DETECTED Final   Enterotoxigenic E coli (ETEC) NOT DETECTED NOT DETECTED Final   Shiga like toxin producing E coli (STEC) NOT DETECTED NOT DETECTED Final   Shigella/Enteroinvasive E coli (EIEC) NOT DETECTED NOT DETECTED Final   Cryptosporidium NOT DETECTED NOT DETECTED Final   Cyclospora cayetanensis NOT DETECTED NOT DETECTED Final   Entamoeba histolytica NOT DETECTED NOT DETECTED Final   Giardia lamblia NOT DETECTED NOT DETECTED Final   Adenovirus F40/41 NOT DETECTED NOT DETECTED Final   Astrovirus NOT DETECTED NOT DETECTED Final   Norovirus GI/GII NOT DETECTED NOT DETECTED Final   Rotavirus A NOT DETECTED NOT DETECTED Final   Sapovirus (I, II, IV, and V) NOT DETECTED NOT DETECTED Final  C difficile quick scan w PCR reflex     Status: Abnormal   Collection Time: 12/12/15  5:46 PM  Result Value Ref Range Status   C Diff antigen POSITIVE (A) NEGATIVE Final   C Diff toxin NEGATIVE NEGATIVE Final   C Diff interpretation Results are indeterminate. See PCR results.  Final  Clostridium Difficile by PCR     Status: Abnormal   Collection Time: 12/12/15  5:46 PM  Result Value Ref Range Status   Toxigenic C Difficile by pcr POSITIVE (A) NEGATIVE Final    Comment: Positive for toxigenic C. difficile with little to no toxin production. Only treat if clinical presentation suggests symptomatic illness. Performed at [facility]      Radiology Studies: No results found.  Scheduled Meds: . acidophilus  1 capsule Oral Daily  . acyclovir  400 mg Oral Daily  . aspirin  81 mg Oral Daily  . calcium-vitamin D  1 tablet Oral QPM  . estradiol  1 mg Oral Daily  . feeding supplement (ENSURE ENLIVE)  237 mL Oral BID BM  . FLUoxetine  20 mg Oral Daily  . fluticasone  2 spray Each Nare Daily  . fluticasone furoate-vilanterol  1 puff  Inhalation Daily  . gabapentin  300 mg Oral BID  . levothyroxine  112 mcg Oral QAC breakfast  . loratadine  10 mg Oral Daily  . metoprolol  25 mg Oral BID  .  multivitamin with minerals  1 tablet Oral Daily  . omega-3 acid ethyl esters  1 capsule Oral Daily  . simvastatin  40 mg Oral QHS  . vancomycin  500 mg Oral Q6H  . vitamin E  400 Units Oral Daily   Continuous Infusions: . sodium chloride 10 mL/hr at 12/13/15 1206     LOS: 4 days    Chipper Oman, MD [facility] Pager [phone]  If 7PM-7AM, please contact night-coverage [email or web address] Password [email or web address] 12/15/2015, 11:19 AM

## 2015-12-15 NOTE — Progress Notes (Signed)
Taking over care of patient. Agree with previous RN assessment. Patient denies any needs at this time, will continue to monitor.

## 2015-12-15 NOTE — Progress Notes (Signed)
CRITICAL VALUE ALERT  Critical value received:  Platelets 29  Date of notification:  12/15/2015   Time of notification:  7680  Critical value read back:Yes.    Nurse who received alert:  Clementeen Hoof   MD notified (1st page):  M. Lynch  Time of first page:  [phone]  MD notified (2nd page):  Time of second page:  Responding MD: M. Donnal Debar  Time MD responded: [phone]

## 2015-12-16 DIAGNOSIS — E039 Hypothyroidism, unspecified: Secondary | ICD-10-CM

## 2015-12-16 LAB — CBC
HCT: 34.8 % — ABNORMAL LOW (ref 36.0–46.0)
HEMOGLOBIN: 11.7 g/dL — AB (ref 12.0–15.0)
MCH: 30.8 pg (ref 26.0–34.0)
MCHC: 33.6 g/dL (ref 30.0–36.0)
MCV: 91.6 fL (ref 78.0–100.0)
Platelets: 27 10*3/uL — CL (ref 150–400)
RBC: 3.8 MIL/uL — ABNORMAL LOW (ref 3.87–5.11)
RDW: 19.2 % — ABNORMAL HIGH (ref 11.5–15.5)
WBC: 8.3 10*3/uL (ref 4.0–10.5)

## 2015-12-16 LAB — CULTURE, BLOOD (ROUTINE X 2)
CULTURE: NO GROWTH
Culture: NO GROWTH

## 2015-12-16 MED ORDER — HEPARIN SOD (PORK) LOCK FLUSH 100 UNIT/ML IV SOLN
500.0000 [IU] | INTRAVENOUS | Status: AC | PRN
  Administered 2015-12-16: 500 [IU]

## 2015-12-16 MED ORDER — VANCOMYCIN 50 MG/ML ORAL SOLUTION: 500.0000 mg | Freq: Four times a day (QID) | ORAL | 0 refills | Status: AC

## 2015-12-16 MED ORDER — ACIDOPHILUS LACTOBACILLUS PO CAPS: 1.0000 | ORAL_CAPSULE | Freq: Three times a day (TID) | ORAL | 0 refills | Status: DC

## 2015-12-16 NOTE — Care Management Important Message (Signed)
Important Message  Patient Details  Name: Diamond Boyd MRN: [id number] Date of Birth: Sep 20, 1940   Medicare Important Message Given:  Yes    Erenest Rasher, RN 12/16/2015, 2:40 PM

## 2015-12-16 NOTE — Discharge Summary (Addendum)
Physician Discharge Summary  [name]  GGE:[id number]  DOB: February 04, 1941  DOA: 12/11/2015 PCP: [name], MD  Admit date: 12/11/2015 Discharge date: 12/16/2015  Admitted From: Home  Disposition:  Home  Recommendations for Outpatient Follow-up:  1. Follow up with PCP in 1-2 weeks 2. Please obtain BMP/CBC in one week 3. Please follow up on the following pending results:  Home Health: HHPT  Equipment/Devices: None   Discharge Condition: Stable   CODE STATUS: Full  Diet recommendation: Heart Healthy / Carb Modified    Brief/Interim Summary: NaomiWestbrookis a 75 y.o.female,With history of colon cancer status post resection, chronic systolic CHF, Hodgkin lymphoma, hypothyroidism, hyperlipidemia, patient presents with complaints of generalized weakness and fatigue, over last 2 weeks, patient currently on IVIG, managed by Dr.Gorsuchfor decreased immunity from Hodgkin lymphoma treatment, and chronic maxillary sinusitis, with multiple antibiotic courses recently, patient reports decreased appetite, fatigue, generalized weakness, over last 2 weeks, and fever almost on a daily basis, as well-developed diarrhea, watery over last 48 hours which prompted her to come to ED, she denies any chest pain, abdominal pain, nausea vomiting, coffee-ground emesis, or bright red blood per rectum or melena. - in [place] was febrile 102.9, tachycardic, elevated lactic acid was 3.3, CT abdomen pelvis significant for colitis, and and troponin 0.04,Found to be C diff positive started on oral vanco initially lower dose, subsequently increase due to poor response. Patient with 1-2 BM per day and significant improvement. Will be discharge home with oral Vanco to complete a 14 days course.   Subjective: Patient seen and examined at bedside. Patient had only 1 BM today, which was soft. Denies abdominal pain. Tolerating diet ok. Patient has remain afebrile for > 72 hrs, Diarrhea has improve. Patient denies chest  pain, SOB and gross bleeding. Want to go home.   Discharge Diagnoses:   Sepsis secondary to colitis due to C. difficile - sepsis resolved, afebrile  -Abnormal urinalysis, but contaminated specimen, denies any urinary symptoms, chest x-ray with no evidence of infection, follow on blood cultures  Severe C. Difficile colitis - Initial presentation, with fever, low albumin, elevated lactic acid and thrombocytopenia, now improving  - Patient presented with diarrhea over last week, CT abdomen and pelvis significant for colitis, patient on multiple antibiotic regimen for her sinusitis. -Continue oral Vanco 500 mg q 6 hrs  -Discontinue PPI - increase risk for C. Difficile -Lactobacilus  -Follow up with PMD   Hodgkin lymphoma -Management per oncology, Dr. Alvy Bimler, currently on IVIG for immunosuppression  -Has follow up appointment on Thursday 46/5/03  Chronic systolic heart failure - stable no signs of fluid overload  -Echo in 01/21/2015 showing EF of 40-45%  Transaminitis -Most likely related to infection -Follow as outpatient   Thrombocytopenia s/p 1 unit of platelets - 2/2 infection vs consumption vs Chemo side effects,  -Hold ASA  -repeat CBC in 1 week  -Advised if any signs of bleeding to call PMD or go to ER   Chronic maxillary sinusitis -This has been followed by ENT as an outpatient, patient received multiple prolonged antibiotic therapies -ENT as outpatient   Elevated troponins - likely new baseline asymptomatic, no EKG changes  -Holding ASA due to thrombocytopenia - to discuss with PMD when to resume   -Continue BB and Losratan   Hypertension -stable  -Continue home meds -Follow up with PMD   Hypothyroidism -Continue with Synthroid  Discharge Instructions  Discharge Instructions    Call MD for:  difficulty breathing, headache or visual disturbances  Complete by:  As directed    Call MD for:  extreme fatigue    Complete by:  As directed    Call MD for:   hives    Complete by:  As directed    Call MD for:  persistant dizziness or light-headedness    Complete by:  As directed    Call MD for:  persistant nausea and vomiting    Complete by:  As directed    Call MD for:  redness, tenderness, or signs of infection (pain, swelling, redness, odor or green/yellow discharge around incision site)    Complete by:  As directed    Call MD for:  severe uncontrolled pain    Complete by:  As directed    Call MD for:  temperature >100.4    Complete by:  As directed    Diet - low sodium heart healthy    Complete by:  As directed    Increase activity slowly    Complete by:  As directed        Medication List    STOP taking these medications   aspirin 81 MG tablet   omeprazole 40 MG capsule Commonly known as:  PRILOSEC     TAKE these medications   acetaminophen 650 MG CR tablet Commonly known as:  TYLENOL Take 650 mg by mouth every 8 (eight) hours as needed for pain or fever.   Acidophilus Lactobacillus Caps Take 1 capsule by mouth 3 (three) times daily.   acyclovir 400 MG tablet Commonly known as:  ZOVIRAX Take 1 tablet (400 mg total) by mouth daily.   CALCIUM 600+D 600-400 MG-UNIT tablet Generic drug:  Calcium Carbonate-Vitamin D Take 1 tablet by mouth every evening.   celecoxib 200 MG capsule Commonly known as:  CELEBREX Take 1 capsule (200 mg total) by mouth daily as needed for moderate pain. What changed:  when to take this   docusate sodium 100 MG capsule Commonly known as:  COLACE Take 100 mg by mouth at bedtime.   estradiol 1 MG tablet Commonly known as:  ESTRACE Take 1 tablet (1 mg total) by mouth daily.   fexofenadine 180 MG tablet Commonly known as:  ALLEGRA TAKE 1 TABLET (180 MG TOTAL) BY MOUTH DAILY. (*OTC)   FLUoxetine 20 MG capsule Commonly known as:  PROZAC Take 1 capsule (20 mg total) by mouth daily.   fluticasone 50 MCG/ACT nasal spray Commonly known as:  FLONASE Place 2 sprays into both nostrils  daily. What changed:  when to take this   fluticasone furoate-vilanterol 200-25 MCG/INH Aepb Commonly known as:  BREO ELLIPTA Inhale 1 puff into the lungs daily.   furosemide 20 MG tablet Commonly known as:  LASIX Take 1 tablet (20 mg total) by mouth daily. Take 1 tab by mouth daily for weight gain.   gabapentin 300 MG capsule Commonly known as:  NEURONTIN Take 1 capsule (300 mg total) by mouth 2 (two) times daily.   levothyroxine 112 MCG tablet Commonly known as:  SYNTHROID, LEVOTHROID Take 1 tablet (112 mcg total) by mouth daily before breakfast.   lidocaine 5 % ointment Commonly known as:  XYLOCAINE Apply 1 application topically 3 (three) times daily as needed. To rectal area   lidocaine-prilocaine cream Commonly known as:  EMLA Apply 1 application topically as needed (For port-a-cath.). Reported on 02/22/2015   losartan 25 MG tablet Commonly known as:  COZAAR Take 1 tablet (25 mg total) by mouth daily.   metoprolol 50 MG tablet Commonly  known as:  LOPRESSOR TAKE 1 TABLET (50 MG TOTAL) BY MOUTH 2 (TWO) TIMES DAILY.   mometasone-formoterol 200-5 MCG/ACT Aero Commonly known as:  DULERA Inhale 2 puffs into the lungs 2 (two) times daily.   Omega-3 350 MG Caps Take 1 capsule by mouth daily. Reported on 02/22/2015   ondansetron 8 MG tablet Commonly known as:  ZOFRAN Take 8 mg by mouth every 6 (six) hours as needed for nausea or vomiting. Reported on 03/05/2015   polyethylene glycol packet Commonly known as:  MIRALAX / GLYCOLAX Take 17 g by mouth at bedtime.   PROAIR RESPICLICK 062 (90 Base) MCG/ACT Aepb Generic drug:  Albuterol Sulfate Inhale 1-2 puffs into the lungs every 6 (six) hours as needed (SOB, wheezing).   prochlorperazine 10 MG tablet Commonly known as:  COMPAZINE Take 10 mg by mouth every 6 (six) hours as needed for nausea or vomiting. Reported on 03/05/2015   psyllium 58.6 % packet Commonly known as:  METAMUCIL Take 1-3 packets by mouth at bedtime.    simvastatin 40 MG tablet Commonly known as:  ZOCOR Take 1 tablet (40 mg total) by mouth at bedtime.   vancomycin 50 mg/mL oral solution Commonly known as:  VANCOCIN Take 10 mLs (500 mg total) by mouth every 6 (six) hours.   Vitamin D-3 1000 units Caps Take 1,000 Units by mouth daily.   vitamin E 400 UNIT capsule Take 400 Units by mouth daily. Reported on 03/05/2015      Follow-up Information    [place] .   Why:  [facility] Contact information: [place] [id number] [phone]          Allergies  Allergen Reactions  . Achromycin [Tetracycline Hcl] Other (See Comments)    Pt does not remember reaction  . Allopurinol Other (See Comments)    REACTION: Unsure of reaction happene years ago  . Astelin [Azelastine Hcl] Other (See Comments)    Reaction unknown  . Cephalexin Other (See Comments)    REACTION: unsure of reaction happened yrs ago.  . Codeine Other (See Comments)    REACTION: abd. pain  . Lisinopril Cough  . Meloxicam Other (See Comments)    REACTION: GI symptoms  . Minocycline Other (See Comments)    Abdominal pain  . Nabumetone Other (See Comments)    REACTION: reaction not known  . Nyquil [Pseudoeph-Doxylamine-Dm-Apap] Hives  . Sulfa Antibiotics Other (See Comments)    Gi side eff   . Zolpidem Tartrate Other (See Comments)    REACTION: feels too drugged  . Buspar [Buspirone Hcl] Other (See Comments)    Dizziness, and not as effective for anxiety  . Ciprofloxacin Rash    Consultations:  None    Procedures/Studies: Dg Chest 2 View  Result Date: 12/11/2015 CLINICAL DATA:  Shortness of breath. EXAM: CHEST  2 VIEW COMPARISON:  Radiographs of July 17, 2015. FINDINGS: The heart size and mediastinal contours are within normal limits. Both lungs are clear. No pneumothorax or pleural effusion is noted. Atherosclerosis of thoracic aorta is noted. Right internal jugular Port-A-Cath is unchanged in  position with distal tip in expected position of the SVC. The visualized skeletal structures are unremarkable. IMPRESSION: No active cardiopulmonary disease.  Aortic atherosclerosis. Electronically Signed   By: Marijo Conception, M.D.   On: 12/11/2015 10:33   Ct Abdomen Pelvis W Contrast  Result Date: 12/11/2015 CLINICAL DATA:  75 year old female with diarrhea floor weeks. Fever. Increasing weakness. Rectal pain. History  non-Hodgkin's lymphoma 1994, colon cancer 2011 and Hodgkin's disease 2015. Prior chemotherapy. Ongoing IVIG. Post cholecystectomy, splenectomy, hernia repair, colectomy and hysterectomy. Initial encounter. EXAM: CT ABDOMEN AND PELVIS WITH CONTRAST TECHNIQUE: Multidetector CT imaging of the abdomen and pelvis was performed using the standard protocol following bolus administration of intravenous contrast. CONTRAST:  173m ISOVUE-300 IOPAMIDOL (ISOVUE-300) INJECTION 61% COMPARISON:  07/10/2015 PET-CT. FINDINGS: Lower chest: Minimal scarring lung bases. No worrisome lesion. Coronary artery calcifications. Heart size within normal limits. Small epicardial lymph nodes. Hepatobiliary: Elongated liver spanning over 20 cm. No worrisome hepatic lesion noted. Minimal periportal edema. Post cholecystectomy. Pancreas: No mass or inflammation. Spleen: Post splenectomy. Adrenals/Urinary Tract: No renal or adrenal mass. No renal or ureteral obstructing stone. No hydronephrosis. Stomach/Bowel: Under distended stomach with diffuse wall thickening. This may represent result of under distension and only slightly different than 07/10/2015 exam. Not able to completely exclude underlying gastritis, mass or ulceration. Duodenal diverticulum incidentally noted. Large portion of colon has been resected. Residual colon within the anterior central aspect of the abdomen with thickened walls, new since 07/10/2015 exam and therefore not able to exclude underlying inflammation or mass. No extra luminal bowel inflammatory  process, free fluid or free air. Vascular/Lymphatic: Increase number of normal size retroperitoneal lymph nodes. Calcified aorta without aneurysmal dilation. Reproductive: Post hysterectomy.  No ovarian mass. Other: Diastases rectus musculature with scar from abdominal hernia. Bowel extends immediately beneath the operative site but without bowel containing obstructing hernia. Slightly prominent vessels without clear collaterals noted. Slightly dense breast parenchyma incompletely assessed by CT. Partially decompressed bladder with circumferential wall thickening. Musculoskeletal: Probable hemangioma L2. Several lucencies most prominent at L3 and L4 suggestive of involvement by tumor, minimally changed from prior exam. Degenerative changes L3-4 and L4-5. Fusion L5-S1. IMPRESSION: Large portion of colon has been resected. Residual colon within the anterior central aspect of the abdomen with thickened walls, new since 07/10/2015 exam and therefore not able to exclude underlying inflammation or mass. No extra luminal bowel inflammatory process or free air. Under distended stomach with thickened walls minimally changed from prior exam and may be related to underdistention rather than mass or gastritis. Increased number of normal size retroperitoneal lymph nodes. The number of normal size lymph nodes has increased since prior exam. Increase number lucencies involving the L3 and L4 vertebral body may represent progressive osseous metastatic disease. Post splenectomy, cholecystectomy and hysterectomy. Elongated liver without focal mass. Aortic atherosclerosis. Electronically Signed   By: SGenia DelM.D.   On: 12/11/2015 13:02     Discharge Exam: Vitals:   12/15/15 2111 12/16/15 0610  BP: (!) 127/59 (!) 126/58  Pulse: 81 71  Resp:  18  Temp:  98 F (36.7 C)   Vitals:   12/15/15 1500 12/15/15 2040 12/15/15 2111 12/16/15 0610  BP: 134/66 (!) 127/59 (!) 127/59 (!) 126/58  Pulse: 72 77 81 71  Resp: '18 16  18   '$ Temp: 97.8 F (36.6 C) 99.1 F (37.3 C)  98 F (36.7 C)  TempSrc: Oral Oral  Oral  SpO2: 95% 99%  94%  Weight:    106.7 kg (235 lb 4.8 oz)  Height:        General: Pt is alert, awake, not in acute distress Cardiovascular: RRR, S1/S2 +, no rubs, no gallops Respiratory: CTA bilaterally, no wheezing, no rhonchi Abdominal: Soft, NT, ND, bowel sounds + Extremities: no edema, no cyanosis    The results of significant diagnostics from this hospitalization (including imaging, microbiology, ancillary and laboratory) are  listed below for reference.     Microbiology: Recent Results (from the past 240 hour(s))  Blood culture (routine x 2)     Status: None   Collection Time: 12/11/15 11:39 AM  Result Value Ref Range Status   Specimen Description BLOOD RIGHT ANTECUBITAL  Final   Special Requests BOTTLES DRAWN AEROBIC AND ANAEROBIC 5CC  Final   Culture   Final    NO GROWTH 5 DAYS Performed at [facility]    Report Status 12/16/2015 FINAL  Final  Blood culture (routine x 2)     Status: None   Collection Time: 12/11/15 12:38 PM  Result Value Ref Range Status   Specimen Description BLOOD LEFT ANTECUBITAL  Final   Special Requests BOTTLES DRAWN AEROBIC AND ANAEROBIC 5ML  Final   Culture   Final    NO GROWTH 5 DAYS Performed at [facility]    Report Status 12/16/2015 FINAL  Final  Gastrointestinal Panel by PCR , Stool     Status: None   Collection Time: 12/12/15  2:41 AM  Result Value Ref Range Status   Campylobacter species NOT DETECTED NOT DETECTED Final   Plesimonas shigelloides NOT DETECTED NOT DETECTED Final   Salmonella species NOT DETECTED NOT DETECTED Final   Yersinia enterocolitica NOT DETECTED NOT DETECTED Final   Vibrio species NOT DETECTED NOT DETECTED Final   Vibrio cholerae NOT DETECTED NOT DETECTED Final   Enteroaggregative E coli (EAEC) NOT DETECTED NOT DETECTED Final   Enteropathogenic E coli (EPEC) NOT DETECTED NOT DETECTED Final   Enterotoxigenic  E coli (ETEC) NOT DETECTED NOT DETECTED Final   Shiga like toxin producing E coli (STEC) NOT DETECTED NOT DETECTED Final   Shigella/Enteroinvasive E coli (EIEC) NOT DETECTED NOT DETECTED Final   Cryptosporidium NOT DETECTED NOT DETECTED Final   Cyclospora cayetanensis NOT DETECTED NOT DETECTED Final   Entamoeba histolytica NOT DETECTED NOT DETECTED Final   Giardia lamblia NOT DETECTED NOT DETECTED Final   Adenovirus F40/41 NOT DETECTED NOT DETECTED Final   Astrovirus NOT DETECTED NOT DETECTED Final   Norovirus GI/GII NOT DETECTED NOT DETECTED Final   Rotavirus A NOT DETECTED NOT DETECTED Final   Sapovirus (I, II, IV, and V) NOT DETECTED NOT DETECTED Final  C difficile quick scan w PCR reflex     Status: Abnormal   Collection Time: 12/12/15  5:46 PM  Result Value Ref Range Status   C Diff antigen POSITIVE (A) NEGATIVE Final   C Diff toxin NEGATIVE NEGATIVE Final   C Diff interpretation Results are indeterminate. See PCR results.  Final  Clostridium Difficile by PCR     Status: Abnormal   Collection Time: 12/12/15  5:46 PM  Result Value Ref Range Status   Toxigenic C Difficile by pcr POSITIVE (A) NEGATIVE Final    Comment: Positive for toxigenic C. difficile with little to no toxin production. Only treat if clinical presentation suggests symptomatic illness. Performed at [facility]: BNP (last 3 results)  Recent Labs  01/21/15 0310 01/23/15 1602  BNP 514.0* 503.5*   Basic Metabolic Panel:  Recent Labs Lab 12/11/15 1008 12/12/15 0345 12/13/15 1125 12/14/15 0500 12/15/15 0417  NA 132* 135 133* 135 138  K 3.9 3.7 3.3* 3.5 3.8  CL 100* 104 103 100* 102  CO2 '24 23 23 27 28  '$ GLUCOSE 100* 86 125* 87 104*  BUN '18 15 12 10 13  '$ CREATININE 1.02* 0.82 0.91 0.75 0.69  CALCIUM 9.4  8.5* 8.6* 8.7* 9.0  MG  --   --   --  1.9 2.1   Liver Function Tests:  Recent Labs Lab 12/11/15 1008 12/12/15 0345  AST 67* 68*  ALT 29 28  ALKPHOS 243* 205*  BILITOT 1.1  1.2  PROT 6.2* 5.2*  ALBUMIN 3.2* 2.6*   No results for input(s): LIPASE, AMYLASE in the last 168 hours. No results for input(s): AMMONIA in the last 168 hours. CBC:  Recent Labs Lab 12/11/15 1008 12/12/15 0345 12/13/15 1125 12/14/15 0500 12/15/15 0417 12/16/15 0625  WBC 10.1 5.8 7.0 8.4 7.3 8.3  NEUTROABS 2.1  --   --   --   --   --   HGB 12.9 10.8* 11.4* 11.3* 11.3* 11.7*  HCT 37.2 31.6* 33.6* 33.4* 33.4* 34.8*  MCV 89.2 90.0 90.6 89.8 90.5 91.6  PLT 25* 18* 16* 38* 29* 27*   Cardiac Enzymes:  Recent Labs Lab 12/11/15 1008 12/11/15 1558 12/11/15 2220 12/12/15 0345  TROPONINI 0.04* 0.04* 0.04* 0.04*   BNP: Invalid input(s): POCBNP CBG: No results for input(s): GLUCAP in the last 168 hours. D-Dimer No results for input(s): DDIMER in the last 72 hours. Hgb A1c No results for input(s): HGBA1C in the last 72 hours. Lipid Profile No results for input(s): CHOL, HDL, LDLCALC, TRIG, CHOLHDL, LDLDIRECT in the last 72 hours. Thyroid function studies No results for input(s): TSH, T4TOTAL, T3FREE, THYROIDAB in the last 72 hours.  Invalid input(s): FREET3 Anemia work up No results for input(s): VITAMINB12, FOLATE, FERRITIN, TIBC, IRON, RETICCTPCT in the last 72 hours. Urinalysis    Component Value Date/Time   COLORURINE AMBER (A) 12/11/2015 1008   APPEARANCEUR CLOUDY (A) 12/11/2015 1008   LABSPEC 1.024 12/11/2015 1008   LABSPEC 1.005 01/10/2015 1045   PHURINE 6.5 12/11/2015 1008   GLUCOSEU NEGATIVE 12/11/2015 1008   GLUCOSEU Negative 01/10/2015 1045   HGBUR NEGATIVE 12/11/2015 1008   BILIRUBINUR NEGATIVE 12/11/2015 1008   BILIRUBINUR Negative 01/10/2015 [place] 12/11/2015 1008   PROTEINUR 30 (A) 12/11/2015 1008   UROBILINOGEN 0.2 01/10/2015 1045   NITRITE NEGATIVE 12/11/2015 1008   LEUKOCYTESUR MODERATE (A) 12/11/2015 1008   LEUKOCYTESUR Negative 01/10/2015 1045   Sepsis Labs Invalid input(s): PROCALCITONIN,  WBC,   LACTICIDVEN Microbiology Recent Results (from the past 240 hour(s))  Blood culture (routine x 2)     Status: None   Collection Time: 12/11/15 11:39 AM  Result Value Ref Range Status   Specimen Description BLOOD RIGHT ANTECUBITAL  Final   Special Requests BOTTLES DRAWN AEROBIC AND ANAEROBIC 5CC  Final   Culture   Final    NO GROWTH 5 DAYS Performed at [facility]    Report Status 12/16/2015 FINAL  Final  Blood culture (routine x 2)     Status: None   Collection Time: 12/11/15 12:38 PM  Result Value Ref Range Status   Specimen Description BLOOD LEFT ANTECUBITAL  Final   Special Requests BOTTLES DRAWN AEROBIC AND ANAEROBIC 5ML  Final   Culture   Final    NO GROWTH 5 DAYS Performed at [facility]    Report Status 12/16/2015 FINAL  Final  Gastrointestinal Panel by PCR , Stool     Status: None   Collection Time: 12/12/15  2:41 AM  Result Value Ref Range Status   Campylobacter species NOT DETECTED NOT DETECTED Final   Plesimonas shigelloides NOT DETECTED NOT DETECTED Final   Salmonella species NOT DETECTED NOT DETECTED Final   Yersinia  enterocolitica NOT DETECTED NOT DETECTED Final   Vibrio species NOT DETECTED NOT DETECTED Final   Vibrio cholerae NOT DETECTED NOT DETECTED Final   Enteroaggregative E coli (EAEC) NOT DETECTED NOT DETECTED Final   Enteropathogenic E coli (EPEC) NOT DETECTED NOT DETECTED Final   Enterotoxigenic E coli (ETEC) NOT DETECTED NOT DETECTED Final   Shiga like toxin producing E coli (STEC) NOT DETECTED NOT DETECTED Final   Shigella/Enteroinvasive E coli (EIEC) NOT DETECTED NOT DETECTED Final   Cryptosporidium NOT DETECTED NOT DETECTED Final   Cyclospora cayetanensis NOT DETECTED NOT DETECTED Final   Entamoeba histolytica NOT DETECTED NOT DETECTED Final   Giardia lamblia NOT DETECTED NOT DETECTED Final   Adenovirus F40/41 NOT DETECTED NOT DETECTED Final   Astrovirus NOT DETECTED NOT DETECTED Final   Norovirus GI/GII NOT DETECTED NOT DETECTED  Final   Rotavirus A NOT DETECTED NOT DETECTED Final   Sapovirus (I, II, IV, and V) NOT DETECTED NOT DETECTED Final  C difficile quick scan w PCR reflex     Status: Abnormal   Collection Time: 12/12/15  5:46 PM  Result Value Ref Range Status   C Diff antigen POSITIVE (A) NEGATIVE Final   C Diff toxin NEGATIVE NEGATIVE Final   C Diff interpretation Results are indeterminate. See PCR results.  Final  Clostridium Difficile by PCR     Status: Abnormal   Collection Time: 12/12/15  5:46 PM  Result Value Ref Range Status   Toxigenic C Difficile by pcr POSITIVE (A) NEGATIVE Final    Comment: Positive for toxigenic C. difficile with little to no toxin production. Only treat if clinical presentation suggests symptomatic illness. Performed at [facility]      Time coordinating discharge: Over 30 minutes  SIGNED:  Chipper Oman, MD  [facility] 12/16/2015, 2:00 PM Pager   If 7PM-7AM, please contact night-coverage [email or web address] Password [email or web address]

## 2015-12-16 NOTE — Care Management Note (Signed)
Case Management Note  Patient Details  Name: Diamond Boyd MRN: [id number] Date of Birth: [birth year]  Subjective/Objective:  Sepsis s/t colitis due to cdiff                  Action/Plan: Discharge Planning: AVS reviewed:  NCM reviewed chart. Spoke to pt and explained [employer] in [employer] stock Vancomycin oral solution. Contacted [facility] to make aware of dc home today with HH.   PCP [name] A MD  Expected Discharge Date:  12/16/2015             Expected Discharge Plan:  [place]  In-House Referral:  NA  Discharge planning Services  CM Consult, Medication Assistance  Post Acute Care Choice:  Durable Medical Equipment (Has rw) Choice offered to:  Patient  DME Arranged:  N/A DME Agency:  NA  HH Arranged:  PT [place] Agency:  [place]  Status of Service:  Completed, signed off  If discussed at [place] of Stay Meetings, dates discussed:    Additional Comments:  Erenest Rasher, RN 12/16/2015, 2:38 PM

## 2015-12-17 ENCOUNTER — Telehealth: Payer: Self-pay

## 2015-12-17 DIAGNOSIS — I5022 Chronic systolic (congestive) heart failure: Secondary | ICD-10-CM | POA: Diagnosis not present

## 2015-12-17 DIAGNOSIS — J449 Chronic obstructive pulmonary disease, unspecified: Secondary | ICD-10-CM | POA: Diagnosis not present

## 2015-12-17 DIAGNOSIS — Z9081 Acquired absence of spleen: Secondary | ICD-10-CM | POA: Diagnosis not present

## 2015-12-17 DIAGNOSIS — A0472 Enterocolitis due to Clostridium difficile, not specified as recurrent: Secondary | ICD-10-CM | POA: Diagnosis not present

## 2015-12-17 DIAGNOSIS — Z85828 Personal history of other malignant neoplasm of skin: Secondary | ICD-10-CM | POA: Diagnosis not present

## 2015-12-17 DIAGNOSIS — F329 Major depressive disorder, single episode, unspecified: Secondary | ICD-10-CM | POA: Diagnosis not present

## 2015-12-17 DIAGNOSIS — G620 Drug-induced polyneuropathy: Secondary | ICD-10-CM | POA: Diagnosis not present

## 2015-12-17 DIAGNOSIS — Z85038 Personal history of other malignant neoplasm of large intestine: Secondary | ICD-10-CM | POA: Diagnosis not present

## 2015-12-17 DIAGNOSIS — I739 Peripheral vascular disease, unspecified: Secondary | ICD-10-CM | POA: Diagnosis not present

## 2015-12-17 DIAGNOSIS — M5134 Other intervertebral disc degeneration, thoracic region: Secondary | ICD-10-CM | POA: Diagnosis not present

## 2015-12-17 DIAGNOSIS — C8190 Hodgkin lymphoma, unspecified, unspecified site: Secondary | ICD-10-CM | POA: Diagnosis not present

## 2015-12-17 DIAGNOSIS — I110 Hypertensive heart disease with heart failure: Secondary | ICD-10-CM | POA: Diagnosis not present

## 2015-12-17 DIAGNOSIS — D630 Anemia in neoplastic disease: Secondary | ICD-10-CM | POA: Diagnosis not present

## 2015-12-17 DIAGNOSIS — Z7951 Long term (current) use of inhaled steroids: Secondary | ICD-10-CM | POA: Diagnosis not present

## 2015-12-17 DIAGNOSIS — C8228 Follicular lymphoma grade III, unspecified, lymph nodes of multiple sites: Secondary | ICD-10-CM | POA: Diagnosis not present

## 2015-12-17 DIAGNOSIS — J320 Chronic maxillary sinusitis: Secondary | ICD-10-CM | POA: Diagnosis not present

## 2015-12-17 DIAGNOSIS — T451X5S Adverse effect of antineoplastic and immunosuppressive drugs, sequela: Secondary | ICD-10-CM | POA: Diagnosis not present

## 2015-12-17 NOTE — Telephone Encounter (Signed)
Spoke with pt. She states that she is still weak and a little unsteady but she feels that she is improving daily.   Pt scheduled with Clarene Reamer 12/21/15.   Transition Care Management Follow-up Telephone Call  How have you been since you were released from the hospital? Weak, unsteady, but improving   Do you understand why you were in the hospital? yes   Do you understand the discharge instrucions? yes  Items Reviewed:  Medications reviewed: yes  Allergies reviewed: yes  Dietary changes reviewed: yes  Referrals reviewed: yes   Functional Questionnaire:   Activities of Daily Living (ADLs):   She states they are independent in the following: ambulation, bathing and hygiene, feeding, continence, grooming, toileting and dressing States they require assistance with the following: none   Any transportation issues/concerns?: no   Any patient concerns? no   Confirmed importance and date/time of follow-up visits scheduled: yes   Confirmed with patient if condition begins to worsen call PCP or go to the ER.  Patient was given the Call-a-Nurse line [phone]: yes

## 2015-12-18 ENCOUNTER — Encounter: Payer: Self-pay | Admitting: Cardiovascular Disease

## 2015-12-18 ENCOUNTER — Ambulatory Visit (INDEPENDENT_AMBULATORY_CARE_PROVIDER_SITE_OTHER): Payer: Medicare Other | Admitting: Cardiovascular Disease

## 2015-12-18 VITALS — BP 110/67 | HR 74 | Ht 65.0 in | Wt 233.0 lb

## 2015-12-18 DIAGNOSIS — I4891 Unspecified atrial fibrillation: Secondary | ICD-10-CM

## 2015-12-18 DIAGNOSIS — I2510 Atherosclerotic heart disease of native coronary artery without angina pectoris: Secondary | ICD-10-CM

## 2015-12-18 DIAGNOSIS — I10 Essential (primary) hypertension: Secondary | ICD-10-CM

## 2015-12-18 DIAGNOSIS — I480 Paroxysmal atrial fibrillation: Secondary | ICD-10-CM | POA: Diagnosis not present

## 2015-12-18 DIAGNOSIS — I428 Other cardiomyopathies: Secondary | ICD-10-CM | POA: Diagnosis not present

## 2015-12-18 DIAGNOSIS — E782 Mixed hyperlipidemia: Secondary | ICD-10-CM

## 2015-12-18 NOTE — Patient Instructions (Addendum)
Your physician has recommended that you have a sleep study. This test records several body functions during sleep, including: brain activity, eye movement, oxygen and carbon dioxide blood levels, heart rate and rhythm, breathing rate and rhythm, the flow of air through your mouth and nose, snoring, body muscle movements, and chest and belly movement.  Your physician recommends that you schedule a follow-up appointment in: 4 months. You have been clearance to have your planned surgery.

## 2015-12-19 DIAGNOSIS — A0472 Enterocolitis due to Clostridium difficile, not specified as recurrent: Secondary | ICD-10-CM | POA: Diagnosis not present

## 2015-12-19 DIAGNOSIS — G620 Drug-induced polyneuropathy: Secondary | ICD-10-CM | POA: Diagnosis not present

## 2015-12-19 DIAGNOSIS — J320 Chronic maxillary sinusitis: Secondary | ICD-10-CM | POA: Diagnosis not present

## 2015-12-19 DIAGNOSIS — I110 Hypertensive heart disease with heart failure: Secondary | ICD-10-CM | POA: Diagnosis not present

## 2015-12-19 DIAGNOSIS — T451X5S Adverse effect of antineoplastic and immunosuppressive drugs, sequela: Secondary | ICD-10-CM | POA: Diagnosis not present

## 2015-12-19 DIAGNOSIS — I5022 Chronic systolic (congestive) heart failure: Secondary | ICD-10-CM | POA: Diagnosis not present

## 2015-12-20 ENCOUNTER — Ambulatory Visit (HOSPITAL_BASED_OUTPATIENT_CLINIC_OR_DEPARTMENT_OTHER): Payer: Medicare Other | Admitting: Hematology and Oncology

## 2015-12-20 ENCOUNTER — Other Ambulatory Visit (HOSPITAL_BASED_OUTPATIENT_CLINIC_OR_DEPARTMENT_OTHER): Payer: Medicare Other

## 2015-12-20 ENCOUNTER — Encounter: Payer: Self-pay | Admitting: Hematology and Oncology

## 2015-12-20 ENCOUNTER — Ambulatory Visit: Payer: Medicare Other

## 2015-12-20 ENCOUNTER — Ambulatory Visit (HOSPITAL_BASED_OUTPATIENT_CLINIC_OR_DEPARTMENT_OTHER): Payer: Medicare Other

## 2015-12-20 VITALS — BP 92/42 | HR 55 | Temp 98.3°F | Resp 16

## 2015-12-20 VITALS — BP 103/69 | HR 81 | Temp 98.5°F | Resp 18 | Ht 65.0 in | Wt 236.7 lb

## 2015-12-20 DIAGNOSIS — J320 Chronic maxillary sinusitis: Secondary | ICD-10-CM | POA: Diagnosis not present

## 2015-12-20 DIAGNOSIS — Z95828 Presence of other vascular implants and grafts: Secondary | ICD-10-CM

## 2015-12-20 DIAGNOSIS — D696 Thrombocytopenia, unspecified: Secondary | ICD-10-CM | POA: Diagnosis not present

## 2015-12-20 DIAGNOSIS — C8228 Follicular lymphoma grade III, unspecified, lymph nodes of multiple sites: Secondary | ICD-10-CM

## 2015-12-20 DIAGNOSIS — D801 Nonfamilial hypogammaglobulinemia: Secondary | ICD-10-CM

## 2015-12-20 DIAGNOSIS — A0472 Enterocolitis due to Clostridium difficile, not specified as recurrent: Secondary | ICD-10-CM

## 2015-12-20 DIAGNOSIS — D638 Anemia in other chronic diseases classified elsewhere: Secondary | ICD-10-CM | POA: Diagnosis not present

## 2015-12-20 DIAGNOSIS — I5022 Chronic systolic (congestive) heart failure: Secondary | ICD-10-CM

## 2015-12-20 LAB — CBC WITH DIFFERENTIAL/PLATELET
BASO%: 0.4 % (ref 0.0–2.0)
BASOS ABS: 0 10*3/uL (ref 0.0–0.1)
EOS ABS: 0 10*3/uL (ref 0.0–0.5)
EOS%: 0 % (ref 0.0–7.0)
HCT: 34.8 % (ref 34.8–46.6)
HGB: 11.5 g/dL — ABNORMAL LOW (ref 11.6–15.9)
LYMPH%: 44.5 % (ref 14.0–49.7)
MCH: 30.7 pg (ref 25.1–34.0)
MCHC: 33 g/dL (ref 31.5–36.0)
MCV: 93 fL (ref 79.5–101.0)
MONO#: 1 10*3/uL — ABNORMAL HIGH (ref 0.1–0.9)
MONO%: 17.9 % — AB (ref 0.0–14.0)
NEUT#: 2 10*3/uL (ref 1.5–6.5)
NEUT%: 37.2 % — AB (ref 38.4–76.8)
RBC: 3.74 10*6/uL (ref 3.70–5.45)
RDW: 19.8 % — ABNORMAL HIGH (ref 11.2–14.5)
WBC: 5.5 10*3/uL (ref 3.9–10.3)
lymph#: 2.4 10*3/uL (ref 0.9–3.3)
nRBC: 1 % — ABNORMAL HIGH (ref 0–0)

## 2015-12-20 LAB — COMPREHENSIVE METABOLIC PANEL WITH GFR
ALT: 23 U/L (ref 0–55)
AST: 40 U/L — ABNORMAL HIGH (ref 5–34)
Albumin: 2.8 g/dL — ABNORMAL LOW (ref 3.5–5.0)
Alkaline Phosphatase: 293 U/L — ABNORMAL HIGH (ref 40–150)
Anion Gap: 10 meq/L (ref 3–11)
BUN: 11.9 mg/dL (ref 7.0–26.0)
CO2: 24 meq/L (ref 22–29)
Calcium: 9.5 mg/dL (ref 8.4–10.4)
Chloride: 103 meq/L (ref 98–109)
Creatinine: 0.8 mg/dL (ref 0.6–1.1)
EGFR: 74 ml/min/1.73 m2 — ABNORMAL LOW
Glucose: 121 mg/dL (ref 70–140)
Potassium: 3.8 meq/L (ref 3.5–5.1)
Sodium: 137 meq/L (ref 136–145)
Total Bilirubin: 0.85 mg/dL (ref 0.20–1.20)
Total Protein: 6.1 g/dL — ABNORMAL LOW (ref 6.4–8.3)

## 2015-12-20 LAB — TECHNOLOGIST REVIEW

## 2015-12-20 MED ORDER — ACETAMINOPHEN 325 MG PO TABS
650.0000 mg | ORAL_TABLET | Freq: Once | ORAL | Status: AC
  Administered 2015-12-20: 650 mg via ORAL

## 2015-12-20 MED ORDER — DIPHENHYDRAMINE HCL 25 MG PO CAPS
ORAL_CAPSULE | ORAL | Status: AC
  Filled 2015-12-20: qty 1

## 2015-12-20 MED ORDER — ACETAMINOPHEN 325 MG PO TABS
ORAL_TABLET | ORAL | Status: AC
  Filled 2015-12-20: qty 2

## 2015-12-20 MED ORDER — SODIUM CHLORIDE 0.9 % IV SOLN
INTRAVENOUS | Status: DC
  Administered 2015-12-20: 11:00:00 via INTRAVENOUS

## 2015-12-20 MED ORDER — IMMUNE GLOBULIN (HUMAN) 10 GM/100ML IV SOLN
110.0000 g | Freq: Once | INTRAVENOUS | Status: AC
  Administered 2015-12-20: 110 g via INTRAVENOUS
  Filled 2015-12-20: qty 1000

## 2015-12-20 MED ORDER — DIPHENHYDRAMINE HCL 25 MG PO TABS
25.0000 mg | ORAL_TABLET | Freq: Once | ORAL | Status: AC
  Administered 2015-12-20: 25 mg via ORAL
  Filled 2015-12-20: qty 1

## 2015-12-20 MED ORDER — ALTEPLASE 2 MG IJ SOLR
2.0000 mg | Freq: Once | INTRAMUSCULAR | Status: DC | PRN
  Filled 2015-12-20: qty 2

## 2015-12-20 MED ORDER — SODIUM CHLORIDE 0.9 % IJ SOLN
10.0000 mL | INTRAMUSCULAR | Status: DC | PRN
  Administered 2015-12-20 (×2): 10 mL via INTRAVENOUS
  Filled 2015-12-20: qty 10

## 2015-12-20 MED ORDER — SODIUM CHLORIDE 0.9 % IJ SOLN
10.0000 mL | INTRAMUSCULAR | Status: DC | PRN
  Filled 2015-12-20: qty 10

## 2015-12-20 MED ORDER — HEPARIN SOD (PORK) LOCK FLUSH 100 UNIT/ML IV SOLN
500.0000 [IU] | Freq: Once | INTRAVENOUS | Status: AC | PRN
  Administered 2015-12-20: 500 [IU] via INTRAVENOUS
  Filled 2015-12-20: qty 5

## 2015-12-20 NOTE — Assessment & Plan Note (Signed)
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 I discussed the importance of discontinuation of rituximab and to consider monthly IVIG treatment. The risks, benefit, side effects of IVIG treatment including risk of allergic reaction, hives, itching, serum sickness were discussed and she agreed to proceed. I would dose her at 1 g/kg once every 4 weeks I recommend we continue treatment until spring of next year

## 2015-12-20 NOTE — Assessment & Plan Note (Signed)
She has recent C. difficile colitis secondary to recurrent antibiotic therapy for her chronic sinusitis I recommend she continue antibiotic treatment as directed

## 2015-12-20 NOTE — Assessment & Plan Note (Signed)
She has no evidence of fluid overload or recent exacerbation of congestive heart failure. She will continue close monitoring and follow-up with cardiologist

## 2015-12-20 NOTE — Patient Instructions (Signed)

## 2015-12-20 NOTE — Progress Notes (Signed)
Patient ID: Diamond Boyd, female   DOB: [birth year], 75 y.o.   MRN: [id number]    Primary MD: Dr. Glori Bickers  HPI: Diamond Boyd is a 75 y.o. female who presents to the office today for a 6 month follow up cardiology evaluation.   Diamond Boyd is a 75 year old female who has a history of colon CA, Hodgkin's lymphoma, recent diagnosis of follicular lymphoma, and had initiated chemotherapy.  She also has a history of neuropathy,  and hypothyroidism.  She was admitted to [facility] in December with acute CHF.  An echo Doppler study demonstrated an EF of 40-45%.  She had mild troponin elevation felt to be due to demand ischemia.  The day after discharge, she was admitted on 12/13-17/2016 with atrial fibrillation, rapid ventricular response.  An inpatient Myoview study demonstrated possible anterior scar with minimal peri-infarction ischemia.  Cardiac catheterization revealed mild nonobstructive CAD.  She was anemic but was cleared for anticoagulation by GI.  The plan was to proceed with cardioversion after 4 weeks of uninterrupted anticoagulation if she remained in atrial fibrillation.  She was seen in the office in follow-up and was in sinus rhythm., but due to episodes of recurrent rectal bleeding herr eliquis had to be stopped.  She tried to resume eliquis and again develop recurrent rectal bleeding.    Since I last saw her, she is unaware of any breakthrough atrial fibrillation.  She tells me she may need maxillary sinus surgery to be done by Dr. Redmond Baseman. She denies any episodes of chest pain.  On further questioning, her sleep is poor.  She snores.  She wakes up at least 3 times per night for nocturia.  She admits to feeling groggy during the day and requires daytime naps.  She has been taking furosemide 20 mg every daily for leg swelling, losartan at low-dose 25 mg, Toprol, all tartrate 50 mg twice a day.  She also has been on omeprazole 40 mg daily.  Hyperlipidemia has been treated  with simvastatin 40 mg.  She presents for evaluation and preoperative clearance.  Past Medical History:  Diagnosis Date  . Anemia    hx blood tx  . Benign essential HTN   . Cancer ([place])    skin cancer- basal cell on arm / colon 2011  . Chronic systolic CHF (congestive heart failure), NYHA class 2 ([place]) 01/2015  . Colon cancer ([place])    2011, s/p surgery, in remission  . Colon polyps   . Degenerative disk disease    Thoracic spine  . Depression   . 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 Generalized weakness 05/11/2015  . GERD (gastroesophageal reflux disease)   . Heart murmur   . hodgkins lymphoma   . Hypothyroidism   . Lymphoma ([place])   . Neuropathy due to chemotherapeutic drug ([place]) 06/19/2014  . NICM (nonischemic cardiomyopathy) ([place]) 01/2015  . Osteoarthritis    hands  . 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 PAF (paroxysmal atrial fibrillation) ([place])   . Peripheral vascular disease ([place])   . Pneumonia     Past Surgical History:  Procedure Laterality Date  . BONE MARROW BIOPSY  05/27/13  . BREAST BIOPSY  1996  . CARDIAC CATHETERIZATION N/A 01/25/2015   Procedure: Left Heart Cath and Coronary Angiography;  Surgeon: Peter M Martinique, MD;  Location: [facility];  Service: Cardiovascular;  Laterality: N/A;  .  CATARACT EXTRACTION, BILATERAL  2016  . CHOLECYSTECTOMY    . COLECTOMY  8/11  . COLONOSCOPY W/ BIOPSIES    . DG BIOPSY LUNG    . LYMPH NODE BIOPSY N/A 11/01/2014   Procedure: MEDIASTINAL LYMPH NODE BIOPSY;  Surgeon: Grace Isaac, MD;  Location: [facility];  Service: Thoracic;  Laterality: N/A;  . MEDIASTINOSCOPY N/A 11/01/2014   Procedure: MEDIASTINOSCOPY;  Surgeon: Grace Isaac, MD;  Location: [facility];  Service: Thoracic;  Laterality: N/A;  . PLANTAR FASCIA RELEASE    . port-a-cath insertion    . SPLENECTOMY     lymphoma  . TENDON RELEASE     Right thumb   . VAGINAL  HYSTERECTOMY    . VENTRAL HERNIA REPAIR  1998  . VIDEO BRONCHOSCOPY WITH ENDOBRONCHIAL ULTRASOUND N/A 10/17/2014   Procedure: VIDEO BRONCHOSCOPY WITH ENDOBRONCHIAL ULTRASOUND;  Surgeon: Javier Glazier, MD;  Location: [facility];  Service: Thoracic;  Laterality: N/A;  . VIDEO BRONCHOSCOPY WITH ENDOBRONCHIAL ULTRASOUND N/A 11/01/2014   Procedure: VIDEO BRONCHOSCOPY WITH ENDOBRONCHIAL ULTRASOUND;  Surgeon: Grace Isaac, MD;  Location: [facility];  Service: Thoracic;  Laterality: N/A;    Allergies  Allergen Reactions  . Achromycin [Tetracycline Hcl] Other (See Comments)    Pt does not remember reaction  . Allopurinol Other (See Comments)    REACTION: Unsure of reaction happene years ago  . Astelin [Azelastine Hcl] Other (See Comments)    Reaction unknown  . Cephalexin Other (See Comments)    REACTION: unsure of reaction happened yrs ago.  . Codeine Other (See Comments)    REACTION: abd. pain  . Lisinopril Cough  . Meloxicam Other (See Comments)    REACTION: GI symptoms  . Minocycline Other (See Comments)    Abdominal pain  . Nabumetone Other (See Comments)    REACTION: reaction not known  . Nyquil [Pseudoeph-Doxylamine-Dm-Apap] Hives  . Sulfa Antibiotics Other (See Comments)    Gi side eff   . Zolpidem Tartrate Other (See Comments)    REACTION: feels too drugged  . Buspar [Buspirone Hcl] Other (See Comments)    Dizziness, and not as effective for anxiety  . Ciprofloxacin Rash    Current Outpatient Prescriptions  Medication Sig Dispense Refill  . acetaminophen (TYLENOL) 650 MG CR tablet Take 650 mg by mouth every 8 (eight) hours as needed for pain or fever.     . Acidophilus Lactobacillus CAPS Take 1 capsule by mouth 3 (three) times daily. 90 capsule 0  . acyclovir (ZOVIRAX) 400 MG tablet Take 1 tablet (400 mg total) by mouth daily. 90 tablet 3  . Albuterol Sulfate (PROAIR RESPICLICK) 314 (90 Base) MCG/ACT AEPB Inhale 1-2 puffs into the lungs every 6 (six) hours as needed (SOB,  wheezing).     . Calcium Carbonate-Vitamin D (CALCIUM 600+D) 600-400 MG-UNIT per tablet Take 1 tablet by mouth every evening.     . celecoxib (CELEBREX) 200 MG capsule Take 1 capsule (200 mg total) by mouth daily as needed for moderate pain. (Patient taking differently: Take 200 mg by mouth every other day. ) 90 capsule 3  . Cholecalciferol (VITAMIN D-3) 1000 UNITS CAPS Take 1,000 Units by mouth daily.    Marland Kitchen docusate sodium (COLACE) 100 MG capsule Take 100 mg by mouth at bedtime.    Marland Kitchen estradiol (ESTRACE) 1 MG tablet Take 1 tablet (1 mg total) by mouth daily. 90 tablet 3  . fexofenadine (ALLEGRA) 180 MG tablet TAKE 1 TABLET (180 MG TOTAL) BY MOUTH DAILY. (*OTC)  3  . FLUoxetine (PROZAC) 20 MG capsule Take 1 capsule (20 mg total) by mouth daily. 90 capsule 3  . fluticasone (FLONASE) 50 MCG/ACT nasal spray Place 2 sprays into both nostrils daily. (Patient taking differently: Place 2 sprays into both nostrils 2 (two) times daily. ) 48 g 3  . fluticasone furoate-vilanterol (BREO ELLIPTA) 200-25 MCG/INH AEPB Inhale 1 puff into the lungs daily. 30 each 6  . furosemide (LASIX) 20 MG tablet Take 1 tablet (20 mg total) by mouth daily. Take 1 tab by mouth daily for weight gain. 90 tablet 3  . gabapentin (NEURONTIN) 300 MG capsule Take 1 capsule (300 mg total) by mouth 2 (two) times daily. 180 capsule 3  . levothyroxine (SYNTHROID, LEVOTHROID) 112 MCG tablet Take 1 tablet (112 mcg total) by mouth daily before breakfast. 90 tablet 3  . lidocaine (XYLOCAINE) 5 % ointment Apply 1 application topically 3 (three) times daily as needed. To rectal area 35.44 g 1  . lidocaine-prilocaine (EMLA) cream Apply 1 application topically as needed (For port-a-cath.). Reported on 02/22/2015    . losartan (COZAAR) 25 MG tablet Take 1 tablet (25 mg total) by mouth daily. 90 tablet 3  . metoprolol (LOPRESSOR) 50 MG tablet TAKE 1 TABLET (50 MG TOTAL) BY MOUTH 2 (TWO) TIMES DAILY. 180 tablet 3  . mometasone-formoterol (DULERA) 200-5  MCG/ACT AERO Inhale 2 puffs into the lungs 2 (two) times daily. 1 Inhaler 5  . Omega-3 350 MG CAPS Take 1 capsule by mouth daily. Reported on 02/22/2015    . ondansetron (ZOFRAN) 8 MG tablet Take 8 mg by mouth every 6 (six) hours as needed for nausea or vomiting. Reported on 03/05/2015    . polyethylene glycol (MIRALAX / GLYCOLAX) packet Take 17 g by mouth at bedtime.    . prochlorperazine (COMPAZINE) 10 MG tablet Take 10 mg by mouth every 6 (six) hours as needed for nausea or vomiting. Reported on 03/05/2015    . psyllium (METAMUCIL) 58.6 % packet Take 1-3 packets by mouth at bedtime.     . simvastatin (ZOCOR) 40 MG tablet Take 1 tablet (40 mg total) by mouth at bedtime. 90 tablet 3  . vancomycin (VANCOCIN) 50 mg/mL oral solution Take 10 mLs (500 mg total) by mouth every 6 (six) hours. 400 mL 0  . vitamin E (VITAMIN E) 400 UNIT capsule Take 400 Units by mouth daily. Reported on 03/05/2015     No current facility-administered medications for this visit.    Facility-Administered Medications Ordered in Other Visits  Medication Dose Route Frequency Provider Last Rate Last Dose  . sodium chloride 0.9 % injection 10 mL  10 mL Intravenous PRN [name], MD   10 mL at 12/20/15 1436    Social History   Social History  . Marital status: Married    Spouse name: N/A  . Number of children: 2  . Years of education: N/A   Occupational History  . Retired  Retired   Social History Main Topics  . Smoking status: Never Smoker  . Smokeless tobacco: Never Used     Comment: Second-hand exposure through father.  . Alcohol use No  . Drug use: No  . Sexual activity: No   Other Topics Concern  . Not on file   Social History Narrative   Originally from [place]. Always lived in [place]. Previously has traveled to [facility], [place] & up [employer] to [place]. No international travel. No pets currently. Remote parakeet exposure with her children. 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    Lives at home  with husband. Weakness due to chemo, but otherwise independant       Family History  Problem Relation Age of Onset  . Coronary artery disease Mother   . Diabetes Mother   . Fibromyalgia Daughter     chronic pain   . COPD Daughter   . Diabetes Brother   . Asthma Daughter   . Rheum arthritis Brother   . Clotting disorder Brother   . Alcohol abuse Sister   . Colon cancer Neg Hx   . Colon polyps Neg Hx   . Stomach cancer Neg Hx   . Rectal cancer Neg Hx   . Ulcerative colitis Neg Hx   . Crohn's disease Neg Hx     ROS General: Negative; No fevers, chills, or night sweats HEENT: Positive for maxillary sinus problems.  No changes in vision or hearing, difficulty swallowing Pulmonary: Negative; No cough, wheezing, shortness of breath, hemoptysis Cardiovascular: See HPI:  GI: Negative; No nausea, vomiting, diarrhea, or abdominal pain GU: Negative; No dysuria, hematuria, or difficulty voiding Musculoskeletal: Negative; no myalgias, joint pain, or weakness Hematologic: Negative; no easy bruising, bleeding Endocrine: Negative; no heat/cold intolerance; no diabetes, Neuro: Negative; no changes in balance, headaches Skin: Negative; No rashes or skin lesions Psychiatric: Negative; No behavioral problems, depression Sleep: Positive for snoring,  daytime sleepiness, hypersomnolence; No bruxism, restless legs, hypnogognic hallucinations. Other comprehensive 14 point system review is negative   Physical Exam BP 110/67   Pulse 74   Ht 5' 5" (1.651 m)   Wt 233 lb (105.7 kg)   LMP 02/10/1970   BMI 38.77 kg/m  Wt Readings from Last 3 Encounters:  12/20/15 236 lb 11.2 oz (107.4 kg)  12/18/15 233 lb (105.7 kg)  12/16/15 235 lb 4.8 oz (106.7 kg)   General: Alert, oriented, no distress.  Skin: normal turgor, no rashes, warm and dry HEENT: Normocephalic, atraumatic. Pupils equal round and reactive to light; sclera anicteric; extraocular muscles intact, No lid lag; Nose without nasal septal  hypertrophy; Mouth/Parynx benign; Mallinpatti scale 3 Neck: No JVD, no carotid bruits; normal carotid upstroke Lungs: clear to ausculatation and percussion bilaterally; no wheezing or rales, normal inspiratory and expiratory effort Chest wall: without tenderness to palpitation Heart: PMI not displaced, RRR, s1 s2 normal, faint 1/6 sem systolic murmur, No diastolic murmur, no rubs, gallops, thrills, or heaves Abdomen: soft, nontender; no hepatosplenomehaly, BS+; abdominal aorta nontender and not dilated by palpation. Back: no CVA tenderness Pulses: 2+  Musculoskeletal: full range of motion, normal strength, no joint deformities Extremities: Pulses 2+, no clubbing cyanosis or edema, Homan's sign negative  Neurologic: grossly nonfocal; Cranial nerves grossly wnl Psychologic: Normal mood and affect  ECG (independently read by me): Normal sinus rhythm at 62 bpm.  Left bundle-branch block with repolarization changes.  QTc interval 511 ms.  May 2017 ECG (independently read by me): Sinus bradycardia 56 bpm with mild sinus arrhythmia.  Left bundle branch block.).  Axis.  LABS:  BMP Latest Ref Rng & Units 12/20/2015 12/15/2015 12/14/2015  Glucose 70 - 140 mg/dl 121 104(H) 87  BUN 7.0 - 26.0 mg/dL 11._0 Creatinine 0.6 - 1.1 mg/dL 0.8 0.69 0.75  Sodium 136 - 145 mEq/L 137 138 135  Potassium 3.5 - 5.1 mEq/L 3.8 3.8 3.5  Chloride 101 - 111 mmol/L - 102 100(L)  CO2 22 - 29 mEq/L _1 Calcium 8.4 - 10.4 mg/dL 9.5 9.0 8.7(L)  Hepatic Function Latest Ref Rng & Units 12/20/2015 12/12/2015 12/11/2015  Total Protein 6.4 - 8.3 g/dL 6.1(L) 5.2(L) 6.2(L)  Albumin 3.5 - 5.0 g/dL 2.8(L) 2.6(L) 3.2(L)  AST 5 - 34 U/L 40(H) 68(H) 67(H)  ALT 0 - 55 U/L _0 Alk Phosphatase 40 - 150 U/L 293(H) 205(H) 243(H)  Total Bilirubin 0.20 - 1.20 mg/dL 0.85 1.2 1.1  Bilirubin, Direct 0.1 - 0.5 mg/dL - 0.3 -    CBC Latest Ref Rng & Units 12/20/2015 12/16/2015 12/15/2015  WBC 3.9 - 10.3 10e3/uL 5.5 8.3 7.3   Hemoglobin 11.6 - 15.9 g/dL 11.5(L) 11.7(L) 11.3(L)  Hematocrit 34.8 - 46.6 % 34.8 34.8(L) 33.4(L)  Platelets 145 - 400 10e3/uL 41 Large platelets present(L) 27(LL) 29(LL)   Lab Results  Component Value Date   MCV 93.0 12/20/2015   MCV 91.6 12/16/2015   MCV 90.5 12/15/2015    Lab Results  Component Value Date   TSH 2.94 10/24/2015    BNP    Component Value Date/Time   BNP 622.7 (H) 01/23/2015 1602    ProBNP    Component Value Date/Time   PROBNP 760.3 (H) 04/23/2013 1157     Lipid Panel     Component Value Date/Time   CHOL 148 10/24/2015 1102   TRIG 310.0 (H) 10/24/2015 1102   HDL 14.90 (L) 10/24/2015 1102   CHOLHDL 10 10/24/2015 1102   VLDL 62.0 (H) 10/24/2015 1102   [place] 95 01/25/2015 0442   LDLDIRECT 87.0 10/24/2015 1102     RADIOLOGY: Dg Chest 2 View  Result Date: 12/11/2015 CLINICAL DATA:  Shortness of breath. EXAM: CHEST  2 VIEW COMPARISON:  Radiographs of July 17, 2015. FINDINGS: The heart size and mediastinal contours are within normal limits. Both lungs are clear. No pneumothorax or pleural effusion is noted. Atherosclerosis of thoracic aorta is noted. Right internal jugular Port-A-Cath is unchanged in position with distal tip in expected position of the SVC. The visualized skeletal structures are unremarkable. IMPRESSION: No active cardiopulmonary disease.  Aortic atherosclerosis. Electronically Signed   By: Marijo Conception, M.D.   On: 12/11/2015 10:33   Ct Abdomen Pelvis W Contrast  Result Date: 12/11/2015 CLINICAL DATA:  75 year old female with diarrhea floor weeks. Fever. Increasing weakness. Rectal pain. History non-Hodgkin's lymphoma 1994, colon cancer 2011 and Hodgkin's disease 2015. Prior chemotherapy. Ongoing IVIG. Post cholecystectomy, splenectomy, hernia repair, colectomy and hysterectomy. Initial encounter. EXAM: CT ABDOMEN AND PELVIS WITH CONTRAST TECHNIQUE: Multidetector CT imaging of the abdomen and pelvis was performed using the standard  protocol following bolus administration of intravenous contrast. CONTRAST:  168m ISOVUE-300 IOPAMIDOL (ISOVUE-300) INJECTION 61% COMPARISON:  07/10/2015 PET-CT. FINDINGS: Lower chest: Minimal scarring lung bases. No worrisome lesion. Coronary artery calcifications. Heart size within normal limits. Small epicardial lymph nodes. Hepatobiliary: Elongated liver spanning over 20 cm. No worrisome hepatic lesion noted. Minimal periportal edema. Post cholecystectomy. Pancreas: No mass or inflammation. Spleen: Post splenectomy. Adrenals/Urinary Tract: No renal or adrenal mass. No renal or ureteral obstructing stone. No hydronephrosis. Stomach/Bowel: Under distended stomach with diffuse wall thickening. This may represent result of under distension and only slightly different than 07/10/2015 exam. Not able to completely exclude underlying gastritis, mass or ulceration. Duodenal diverticulum incidentally noted. Large portion of colon has been resected. Residual colon within the anterior central aspect of the abdomen with thickened walls, new since 07/10/2015 exam and therefore not able to exclude underlying inflammation or mass. No extra luminal bowel inflammatory process, free fluid or free air. Vascular/Lymphatic: Increase number  of normal size retroperitoneal lymph nodes. Calcified aorta without aneurysmal dilation. Reproductive: Post hysterectomy.  No ovarian mass. Other: Diastases rectus musculature with scar from abdominal hernia. Bowel extends immediately beneath the operative site but without bowel containing obstructing hernia. Slightly prominent vessels without clear collaterals noted. Slightly dense breast parenchyma incompletely assessed by CT. Partially decompressed bladder with circumferential wall thickening. Musculoskeletal: Probable hemangioma L2. Several lucencies most prominent at L3 and L4 suggestive of involvement by tumor, minimally changed from prior exam. Degenerative changes L3-4 and L4-5. Fusion  L5-S1. IMPRESSION: Large portion of colon has been resected. Residual colon within the anterior central aspect of the abdomen with thickened walls, new since 07/10/2015 exam and therefore not able to exclude underlying inflammation or mass. No extra luminal bowel inflammatory process or free air. Under distended stomach with thickened walls minimally changed from prior exam and may be related to underdistention rather than mass or gastritis. Increased number of normal size retroperitoneal lymph nodes. The number of normal size lymph nodes has increased since prior exam. Increase number lucencies involving the L3 and L4 vertebral body may represent progressive osseous metastatic disease. Post splenectomy, cholecystectomy and hysterectomy. Elongated liver without focal mass. Aortic atherosclerosis. Electronically Signed   By: Genia Del M.D.   On: 12/11/2015 13:02      ASSESSMENT AND PLAN: Diamond Boyd is a 75 year old female who has a history of lung CA status post resection, Hodgkin's lymphoma, malignant B-cell follicular lymphoma, neuropathy, and hypothyroidism and had recently developed atrial fibrillation.  She is maintaining sinus rhythm.  She had been on anticoagulation with eliquis but due to recurrent episodes of rectal bleeding this was discontinued. She required transfusions with packed red blood cells due to her significant anemia from her rectal bleeding.  Her history of atrial fibrillation.  Upon further questioning, I suspect that she may very well have obstructive sleep apnea which can be contributory to her atrial fibrillation development and potential increase her risk of recurrent atrial fibrillation.  For this reason, I'm scheduling her for sleep study for further evaluation.  Lab work will be obtained today.  She has not had any anginal symptoms.  She continues to take levothyroxine for hypothyroidism.  Her blood pressure today is controlled on her current medical regimen.  She  continues to be on simvastatin for hyperlipidemia.  Lab work in September showed elevation of triglycerides.  We discussed diet.  She now on omega-3 fatty acids and I would recommend that this dose be increased.  From a cardiac standpoint she will be given clearance for potential sinus surgery.  I will see her back in the office several months for reevaluation.  Time spent: 25 minutes  Troy Sine, MD, [facility]  12/20/2015 6:55 PM

## 2015-12-20 NOTE — Assessment & Plan Note (Signed)
Recent PET CT scan from 07/10/2015 show significant improvement compared to prior PET scan. Recent CT abdomen in the hospital show indeterminate lymphatic adenopathy I will schedule repeat imaging study next month to assess for possible recurrence of disease before placing her back on maintenance treatment

## 2015-12-20 NOTE — Assessment & Plan Note (Signed)
She has recurrent chronic sinusitis and had underwent ENT evaluation. Hopefully, she can have surgery to her sinuses in the near future to reduce the risk of recurrent bacterial sinus infection

## 2015-12-20 NOTE — Progress Notes (Signed)
Okay to treat today despite decreased Platelet count, per Dr. Alvy Bimler.

## 2015-12-20 NOTE — Assessment & Plan Note (Signed)
This is related to recent infection and her oral antibiotic therapy Platelet count is improving. She is not symptomatic. I will continue to observe

## 2015-12-20 NOTE — Progress Notes (Signed)
[place] OFFICE PROGRESS NOTE  Patient Care Team: Abner Greenspan, MD as PCP - General Mosetta Anis, MD (Allergy) Fanny Skates, MD as Consulting Physician (General Surgery) Grace Isaac, MD as Consulting Physician (Cardiothoracic Surgery) Troy Sine, MD as Consulting Physician (Cardiology) Lonn Georgia, PA-C as Physician Assistant (Cardiology) [name], MD as Consulting Physician (Hematology and Oncology) Estill Cotta, MD as Consulting Physician (Ophthalmology)  SUMMARY OF ONCOLOGIC HISTORY: Oncology History   History of colon cancer   Staging form: Colon and Rectum, AJCC 7th Edition     Clinical: Stage IIA (T3, N0, M0) - Signed by [name], MD on 02/21/2014 History of hodgkin's lymphoma   Staging form: Lymphoid Neoplasms, AJCC 6th Edition     Clinical: Stage IV - Signed by [name], MD on 02/21/2014       History of Hodgkin's lymphoma   05/20/2013 Initial Diagnosis    Hodgkin lymphoma      06/27/2014 Imaging    Interval increase in metabolic activity of several small lymph nodes is concerning for lymphoma recurrence. Lymph nodes include a small right level 2 lymph node, right hilar lymph node, and right paratracheal lymph node      07/07/2014 Pathology Results     limited tissue from ultrasound-guided biopsy was nondiagnostic      07/07/2014 Procedure     ultrasound-guided biopsy was nondiagnostic      09/27/2014 Imaging    Repeat PET CT scan show diffuse disease concern for relapse.      10/17/2014 Pathology Results    Accession: [id number] Biopsy was nondiagnostic      10/17/2014 Procedure    She underwent bronchoscopy & EBUS & biopsy of  LN at Level 10L      11/01/2014 Surgery    She underwent cronchoscopy with endobronchial ultrasound, mediastinoscopy and biopsy       11/01/2014 Pathology Results    Accession: [id number] biopsy showed no evidence of lymphoma, only granulomas.      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 ([place])      12/13/2014 Imaging    PET scan showed diffuse disease      12/28/2014 - 12/30/2014 Hospital Admission    She was admitted to the hospital for cycle one of R-ICE      01/18/2015 - 01/20/2015 Chemotherapy    She was admitted to the hospital for cycle 2 of R-ICE      01/20/2015 - 01/22/2015 Hospital Admission    She was then admitted to [facility] with respiratory failure/fluid overload and was noted to have mild cardiomyopathy      01/23/2015 - 01/27/2015 Hospital Admission    she was admitted to the hospital with new onset atrial fibrillation with rapid ventricular response and was found to have evidence of myocardial ischemia. She was  discharged on medical management and oral anticoagulation therapy      01/25/2015 Procedure    Left heart cath showed non-obstructive lesions: Prox RCA lesion, 40% stenosed.   Prox LAD to Mid LAD lesion, 30% stenosed. Ost LM lesion, 25% stenosed      01/30/2015 Imaging    PET Ct scan showed near complete resolution of all disease      03/12/2015 - 09/10/2015 Chemotherapy    She received 3 doses of Rituximab every other month      07/10/2015 PET scan    PET showed Improved metabolic activity in the bony lesions with mild persistent hilar LN with increased SUV uptake      09/06/2015 Imaging    Ct scan showed interval development of mild mucosal thickening involving left  maxillary and bilateral ethmoid sinuses consistent with mild sinusitis.      12/10/2015 - 12/16/2015 Hospital Admission    She was admitted to the hospital due to diarrhea and colitis and was found to have Clostridium difficile       INTERVAL HISTORY: Please see below for problem oriented charting. She feels better since discharge from the hospital Denies recent fever She denies abdominal discomfort or diarrhea. The patient denies any recent signs or symptoms of bleeding such as spontaneous epistaxis, hematuria or hematochezia. No new lymphadenopathy. She denies recent chest pain or shortness of breath  REVIEW OF SYSTEMS:   Constitutional: Denies fevers, chills or abnormal weight loss Eyes: Denies blurriness of vision Ears, nose, mouth, throat, and face: Denies mucositis or sore throat Respiratory: Denies cough, dyspnea or wheezes Cardiovascular: Denies palpitation, chest discomfort or lower extremity swelling Gastrointestinal:  Denies nausea, heartburn or change in bowel habits Skin: Denies abnormal skin rashes Lymphatics: Denies new lymphadenopathy or easy bruising Neurological:Denies numbness, tingling or new weaknesses Behavioral/Psych: Mood is stable, no new changes  All other systems were reviewed with the patient and are negative.  I have reviewed the past medical history, past surgical history, social history and family history with the patient and they are unchanged from previous note.  ALLERGIES:  is allergic to achromycin [tetracycline hcl]; allopurinol; astelin [azelastine hcl]; cephalexin; codeine; lisinopril; meloxicam; minocycline; nabumetone; nyquil [pseudoeph-doxylamine-dm-apap]; sulfa antibiotics; zolpidem tartrate; buspar [buspirone hcl]; and ciprofloxacin.  MEDICATIONS:  Current Outpatient Prescriptions  Medication Sig Dispense Refill  . acetaminophen (TYLENOL) 650 MG CR tablet Take 650 mg by mouth every 8 (eight) hours as needed for pain or fever.     .  Acidophilus Lactobacillus CAPS Take 1 capsule by mouth 3 (three) times daily. 90 capsule 0  . acyclovir (ZOVIRAX) 400 MG tablet Take 1 tablet (400 mg total) by mouth daily. 90 tablet 3  . Albuterol Sulfate (PROAIR RESPICLICK) 413 (90 Base) MCG/ACT AEPB Inhale 1-2 puffs into the lungs every 6 (six) hours as needed (SOB, wheezing).     . Calcium Carbonate-Vitamin D (CALCIUM 600+D) 600-400 MG-UNIT per tablet Take 1 tablet by mouth every evening.     . celecoxib (CELEBREX) 200 MG capsule Take 1 capsule (200 mg total) by mouth daily as needed for moderate pain. (Patient taking differently: Take 200 mg by mouth every other day. ) 90 capsule 3  . Cholecalciferol (VITAMIN D-3) 1000 UNITS CAPS Take 1,000 Units by mouth daily.    Marland Kitchen docusate sodium (COLACE) 100 MG capsule Take 100 mg by mouth at bedtime.    Marland Kitchen estradiol (ESTRACE) 1 MG tablet Take 1 tablet (1 mg total) by mouth daily. 90 tablet 3  . fexofenadine (ALLEGRA)  180 MG tablet TAKE 1 TABLET (180 MG TOTAL) BY MOUTH DAILY. (*OTC)  3  . FLUoxetine (PROZAC) 20 MG capsule Take 1 capsule (20 mg total) by mouth daily. 90 capsule 3  . fluticasone (FLONASE) 50 MCG/ACT nasal spray Place 2 sprays into both nostrils daily. (Patient taking differently: Place 2 sprays into both nostrils 2 (two) times daily. ) 48 g 3  . fluticasone furoate-vilanterol (BREO ELLIPTA) 200-25 MCG/INH AEPB Inhale 1 puff into the lungs daily. 30 each 6  . furosemide (LASIX) 20 MG tablet Take 1 tablet (20 mg total) by mouth daily. Take 1 tab by mouth daily for weight gain. 90 tablet 3  . gabapentin (NEURONTIN) 300 MG capsule Take 1 capsule (300 mg total) by mouth 2 (two) times daily. 180 capsule 3  . levothyroxine (SYNTHROID, LEVOTHROID) 112 MCG tablet Take 1 tablet (112 mcg total) by mouth daily before breakfast. 90 tablet 3  . lidocaine (XYLOCAINE) 5 % ointment Apply 1 application topically 3 (three) times daily as needed. To rectal area 35.44 g 1  . lidocaine-prilocaine (EMLA) cream Apply  1 application topically as needed (For port-a-cath.). Reported on 02/22/2015    . losartan (COZAAR) 25 MG tablet Take 1 tablet (25 mg total) by mouth daily. 90 tablet 3  . metoprolol (LOPRESSOR) 50 MG tablet TAKE 1 TABLET (50 MG TOTAL) BY MOUTH 2 (TWO) TIMES DAILY. 180 tablet 3  . mometasone-formoterol (DULERA) 200-5 MCG/ACT AERO Inhale 2 puffs into the lungs 2 (two) times daily. 1 Inhaler 5  . Omega-3 350 MG CAPS Take 1 capsule by mouth daily. Reported on 02/22/2015    . ondansetron (ZOFRAN) 8 MG tablet Take 8 mg by mouth every 6 (six) hours as needed for nausea or vomiting. Reported on 03/05/2015    . polyethylene glycol (MIRALAX / GLYCOLAX) packet Take 17 g by mouth at bedtime.    . prochlorperazine (COMPAZINE) 10 MG tablet Take 10 mg by mouth every 6 (six) hours as needed for nausea or vomiting. Reported on 03/05/2015    . psyllium (METAMUCIL) 58.6 % packet Take 1-3 packets by mouth at bedtime.     . simvastatin (ZOCOR) 40 MG tablet Take 1 tablet (40 mg total) by mouth at bedtime. 90 tablet 3  . vancomycin (VANCOCIN) 50 mg/mL oral solution Take 10 mLs (500 mg total) by mouth every 6 (six) hours. 400 mL 0  . vitamin E (VITAMIN E) 400 UNIT capsule Take 400 Units by mouth daily. Reported on 03/05/2015     No current facility-administered medications for this visit.    Facility-Administered Medications Ordered in Other Visits  Medication Dose Route Frequency Provider Last Rate Last Dose  . acetaminophen (TYLENOL) tablet 650 mg  650 mg Oral Once [name], MD      . alteplase (CATHFLO ACTIVASE) injection 2 mg  2 mg Intracatheter Once PRN [name], MD      . diphenhydrAMINE (BENADRYL) tablet 25 mg  25 mg Oral Once [name], MD      . Immune Globulin 10% (PRIVIGEN) IV infusion 110 g  110 g Intravenous Once [name], MD      . sodium chloride 0.9 % injection 10 mL  10 mL Intravenous PRN [name], MD   10 mL at 12/20/15 0851    PHYSICAL EXAMINATION: ECOG PERFORMANCE STATUS: 1 - Symptomatic  but completely ambulatory  Vitals:   12/20/15 0907  BP: 103/69  Pulse: 81  Resp: 18  Temp: 98.5 F (36.9 C)   Filed  Weights   12/20/15 0907  Weight: 236 lb 11.2 oz (107.4 kg)    GENERAL:alert, no distress and comfortable SKIN: skin color, texture, turgor are normal, no rashes or significant lesions EYES: normal, Conjunctiva are pink and non-injected, sclera clear OROPHARYNX:no exudate, no erythema and lips, buccal mucosa, and tongue normal  NECK: supple, thyroid normal size, non-tender, without nodularity LYMPH:  no palpable lymphadenopathy in the cervical, axillary or inguinal LUNGS: clear to auscultation and percussion with normal breathing effort HEART: regular rate & rhythm and no murmurs and no lower extremity edema ABDOMEN:abdomen soft, non-tender and normal bowel sounds Musculoskeletal:no cyanosis of digits and no clubbing  NEURO: alert & oriented x 3 with fluent speech, no focal motor/sensory deficits  LABORATORY DATA:  I have reviewed the data as listed    Component Value Date/Time   NA 137 12/20/2015 0836   K 3.8 12/20/2015 0836   CL 102 12/15/2015 0417   CL 105 07/14/2012 0939   CO2 24 12/20/2015 0836   GLUCOSE 121 12/20/2015 0836   GLUCOSE 103 (H) 07/14/2012 0939   BUN 11.9 12/20/2015 0836   CREATININE 0.8 12/20/2015 0836   CALCIUM 9.5 12/20/2015 0836   PROT 6.1 (L) 12/20/2015 0836   ALBUMIN 2.8 (L) 12/20/2015 0836   AST 40 (H) 12/20/2015 0836   ALT 23 12/20/2015 0836   ALKPHOS 293 (H) 12/20/2015 0836   BILITOT 0.85 12/20/2015 0836   GFRNONAA >60 12/15/2015 0417   GFRAA >60 12/15/2015 0417    No results found for: SPEP, UPEP  Lab Results  Component Value Date   WBC 5.5 12/20/2015   NEUTROABS 2.0 12/20/2015   HGB 11.5 (L) 12/20/2015   HCT 34.8 12/20/2015   MCV 93.0 12/20/2015   PLT 41 Large platelets present (L) 12/20/2015      Chemistry      Component Value Date/Time   NA 137 12/20/2015 0836   K 3.8 12/20/2015 0836   CL 102 12/15/2015  0417   CL 105 07/14/2012 0939   CO2 24 12/20/2015 0836   BUN 11.9 12/20/2015 0836   CREATININE 0.8 12/20/2015 0836      Component Value Date/Time   CALCIUM 9.5 12/20/2015 0836   ALKPHOS 293 (H) 12/20/2015 0836   AST 40 (H) 12/20/2015 0836   ALT 23 12/20/2015 0836   BILITOT 0.85 12/20/2015 0836      ASSESSMENT & PLAN:  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 Recent CT abdomen in the hospital show indeterminate lymphatic adenopathy I will schedule repeat imaging study next month to assess for possible recurrence of disease before placing her back on maintenance treatment  Hypogammaglobulinemia, acquired ([place]) I suspect she has acquired hypogammaglobulinemia from prior treatment causing recurrent, unresolved upper respiratory tract infection. I discussed the importance of discontinuation of rituximab and to consider monthly IVIG treatment. The risks, benefit, side effects of IVIG treatment including risk of allergic reaction, hives, itching, serum sickness were discussed and she agreed to proceed. I would dose her at 1 g/kg once every 4 weeks I recommend we continue treatment until spring of next year  Anemia in chronic illness This is likely anemia of chronic disease. The patient denies recent history of bleeding such as epistaxis, hematuria or hematochezia. She is asymptomatic from the anemia. We will observe for now.   Thrombocytopenia ([place]) This is related to recent infection and her oral antibiotic therapy Platelet count  is improving. She is not symptomatic. I will continue to observe  Chronic systolic CHF (congestive heart failure), NYHA class 2 ([place]) She has no evidence of fluid overload or recent exacerbation of congestive heart failure. She will continue close monitoring and follow-up with cardiologist    Colitis, Clostridium difficile She has recent  C. difficile colitis secondary to recurrent antibiotic therapy for her chronic sinusitis I recommend she continue antibiotic treatment as directed   Chronic maxillary sinusitis She has recurrent chronic sinusitis and had underwent ENT evaluation. Hopefully, she can have surgery to her sinuses in the near future to reduce the risk of recurrent bacterial sinus infection   Orders Placed This Encounter  Procedures  . NM PET Image Restag (PS) Skull Base To Thigh    Standing Status:   Future    Standing Expiration Date:   01/23/2017    Order Specific Question:   Reason for exam:    Answer:   staging lymphoma    Order Specific Question:   Preferred imaging location?    Answer:   [facility]   All questions were answered. The patient knows to call the clinic with any problems, questions or concerns. No barriers to learning was detected. I spent 25 minutes counseling the patient face to face. The total time spent in the appointment was 40 minutes and more than 50% was on counseling and review of test results     [name], MD 12/20/2015 10:47 AM

## 2015-12-20 NOTE — Patient Instructions (Signed)

## 2015-12-20 NOTE — Assessment & Plan Note (Signed)
This is likely anemia of chronic disease. The patient denies recent history of bleeding such as epistaxis, hematuria or hematochezia. She is asymptomatic from the anemia. We will observe for now.  

## 2015-12-21 ENCOUNTER — Ambulatory Visit (INDEPENDENT_AMBULATORY_CARE_PROVIDER_SITE_OTHER): Payer: Medicare Other | Admitting: Family Medicine

## 2015-12-21 ENCOUNTER — Encounter: Payer: Self-pay | Admitting: Family Medicine

## 2015-12-21 VITALS — BP 92/58 | HR 70 | Temp 98.4°F | Wt 235.0 lb

## 2015-12-21 DIAGNOSIS — J320 Chronic maxillary sinusitis: Secondary | ICD-10-CM | POA: Diagnosis not present

## 2015-12-21 DIAGNOSIS — I2510 Atherosclerotic heart disease of native coronary artery without angina pectoris: Secondary | ICD-10-CM

## 2015-12-21 DIAGNOSIS — T451X5S Adverse effect of antineoplastic and immunosuppressive drugs, sequela: Secondary | ICD-10-CM | POA: Diagnosis not present

## 2015-12-21 DIAGNOSIS — I110 Hypertensive heart disease with heart failure: Secondary | ICD-10-CM | POA: Diagnosis not present

## 2015-12-21 DIAGNOSIS — I5022 Chronic systolic (congestive) heart failure: Secondary | ICD-10-CM | POA: Diagnosis not present

## 2015-12-21 DIAGNOSIS — A0472 Enterocolitis due to Clostridium difficile, not specified as recurrent: Secondary | ICD-10-CM | POA: Diagnosis not present

## 2015-12-21 DIAGNOSIS — T451X5A Adverse effect of antineoplastic and immunosuppressive drugs, initial encounter: Secondary | ICD-10-CM

## 2015-12-21 DIAGNOSIS — G620 Drug-induced polyneuropathy: Secondary | ICD-10-CM | POA: Diagnosis not present

## 2015-12-21 NOTE — Progress Notes (Signed)
Subjective:    Patient ID: Diamond Boyd, female    DOB: 10-Dec-1940, 75 y.o.   MRN: [id number]  HPI This is a 75 yo female who presents today for hospital follow up. She was in the hospital 12/11/15-12/16/15 for c. Diff colitis/sepsis. She continues to have frequent, loose bowel movements but they have decreased in frequency and volume. No abdominal pain. Continues to have chronic sinus infection but is currently avoiding antibiotics due to C. Diff. Was recently cleared by cardiology for sinus surgery (Dr. Redmond Baseman). Chronic non productive cough she thinks is related to chronic sinus drainage.. No edema. Is currently undergoing chemo treatment for lymphoma. Has port a cath. No problems. Has neuropathy for which she takes Neurontin 300 mg po BID. She has been on this about 2 years, thinks it helps minimally. Is having physical therapy several times a week to help with walking.  Sleep is poor due to nocturia from prolapsed bladder, she is up at least 3x/ night. She is scheduled for sleep study in about 2 months.   Labs done yesterday in oncology.   Past Medical History:  Diagnosis Date  . Anemia    hx blood tx  . Benign essential HTN   . Cancer ([place])    skin cancer- basal cell on arm / colon 2011  . Chronic systolic CHF (congestive heart failure), NYHA class 2 ([place]) 01/2015  . Colon cancer ([place])    2011, s/p surgery, in remission  . Colon polyps   . Degenerative disk disease    Thoracic spine  . Depression   . 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 Generalized weakness 05/11/2015  . GERD (gastroesophageal reflux disease)   . Heart murmur   . hodgkins lymphoma   . Hypothyroidism   . Lymphoma ([place])   . Neuropathy due to chemotherapeutic drug ([place]) 06/19/2014  . NICM (nonischemic cardiomyopathy) ([place]) 01/2015  . Osteoarthritis    hands  . 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 PAF (paroxysmal  atrial fibrillation) ([place])   . Peripheral vascular disease ([place])   . Pneumonia    Past Surgical History:  Procedure Laterality Date  . BONE MARROW BIOPSY  05/27/13  . BREAST BIOPSY  1996  . CARDIAC CATHETERIZATION N/A 01/25/2015   Procedure: Left Heart Cath and Coronary Angiography;  Surgeon: Peter M Martinique, MD;  Location: [facility];  Service: Cardiovascular;  Laterality: N/A;  . CATARACT EXTRACTION, BILATERAL  2016  . CHOLECYSTECTOMY    . COLECTOMY  8/11  . COLONOSCOPY W/ BIOPSIES    . DG BIOPSY LUNG    . LYMPH NODE BIOPSY N/A 11/01/2014   Procedure: MEDIASTINAL LYMPH NODE BIOPSY;  Surgeon: Grace Isaac, MD;  Location: [facility];  Service: Thoracic;  Laterality: N/A;  . MEDIASTINOSCOPY N/A 11/01/2014   Procedure: MEDIASTINOSCOPY;  Surgeon: Grace Isaac, MD;  Location: [facility];  Service: Thoracic;  Laterality: N/A;  . PLANTAR FASCIA RELEASE    . port-a-cath insertion    . SPLENECTOMY     lymphoma  . TENDON RELEASE     Right thumb   . VAGINAL HYSTERECTOMY    . VENTRAL HERNIA REPAIR  1998  . VIDEO BRONCHOSCOPY WITH ENDOBRONCHIAL ULTRASOUND N/A 10/17/2014   Procedure: VIDEO BRONCHOSCOPY WITH ENDOBRONCHIAL ULTRASOUND;  Surgeon: Javier Glazier, MD;  Location: [facility];  Service: Thoracic;  Laterality: N/A;  . VIDEO BRONCHOSCOPY WITH ENDOBRONCHIAL ULTRASOUND  N/A 11/01/2014   Procedure: VIDEO BRONCHOSCOPY WITH ENDOBRONCHIAL ULTRASOUND;  Surgeon: Grace Isaac, MD;  Location: [facility] OR;  Service: Thoracic;  Laterality: N/A;   Family History  Problem Relation Age of Onset  . Coronary artery disease Mother   . Diabetes Mother   . Fibromyalgia Daughter     chronic pain   . COPD Daughter   . Diabetes Brother   . Asthma Daughter   . Rheum arthritis Brother   . Clotting disorder Brother   . Alcohol abuse Sister   . Colon cancer Neg Hx   . Colon polyps Neg Hx   . Stomach cancer Neg Hx   . Rectal cancer Neg Hx   . Ulcerative colitis Neg Hx   . Crohn's disease Neg Hx     Social History  Substance Use Topics  . Smoking status: Never Smoker  . Smokeless tobacco: Never Used     Comment: Second-hand exposure through father.  . Alcohol use No      Review of Systems Per HPI    Objective:   Physical Exam  Constitutional: She is oriented to person, place, and time. She appears well-developed and well-nourished. No distress.  Obese.   HENT:  Head: Normocephalic and atraumatic.  Eyes: Conjunctivae are normal.  Cardiovascular: Normal rate, regular rhythm and normal heart sounds.   Pulmonary/Chest: Effort normal and breath sounds normal.  Neurological: She is alert and oriented to person, place, and time.  Skin: Skin is warm and dry. She is not diaphoretic.  Vitals reviewed.     BP (!) 92/58 (BP Location: Left Arm, Patient Position: Sitting, Cuff Size: Large)   Pulse 70   Temp 98.4 F (36.9 C) (Oral)   Wt 235 lb (106.6 kg)   LMP 02/10/1970   SpO2 96%   BMI 39.11 kg/m  Wt Readings from Last 3 Encounters:  12/21/15 235 lb (106.6 kg)  12/20/15 236 lb 11.2 oz (107.4 kg)  12/18/15 233 lb (105.7 kg)       Assessment & Plan:  1. C. difficile colitis - finish oral vancomycin - RTC precautions reviwed - continue probiotic  2. Chronic maxillary sinusitis - will hopefully be able to tolerate surgery in near future - continue Allegra, Flonase, f/u with ENT as scheduled  3. Peripheral neuropathy due to chemotherapy ([place]) - continue gabapentin 300 mg BID, follow up with Dr. Alvy Bimler, consider neurology consult in future   Clarene Reamer, FNP-BC  [facility], [place]  12/21/2015 3:50 PM

## 2015-12-21 NOTE — Progress Notes (Signed)
Pre visit review using our clinic review tool, if applicable. No additional management support is needed unless otherwise documented below in the visit note. 

## 2015-12-21 NOTE — Patient Instructions (Addendum)
Continue your vancomycin as directed

## 2015-12-25 DIAGNOSIS — I110 Hypertensive heart disease with heart failure: Secondary | ICD-10-CM | POA: Diagnosis not present

## 2015-12-25 DIAGNOSIS — G620 Drug-induced polyneuropathy: Secondary | ICD-10-CM | POA: Diagnosis not present

## 2015-12-25 DIAGNOSIS — I5022 Chronic systolic (congestive) heart failure: Secondary | ICD-10-CM | POA: Diagnosis not present

## 2015-12-25 DIAGNOSIS — J320 Chronic maxillary sinusitis: Secondary | ICD-10-CM | POA: Diagnosis not present

## 2015-12-25 DIAGNOSIS — T451X5S Adverse effect of antineoplastic and immunosuppressive drugs, sequela: Secondary | ICD-10-CM | POA: Diagnosis not present

## 2015-12-25 DIAGNOSIS — A0472 Enterocolitis due to Clostridium difficile, not specified as recurrent: Secondary | ICD-10-CM | POA: Diagnosis not present

## 2015-12-27 ENCOUNTER — Telehealth: Payer: Self-pay | Admitting: Family Medicine

## 2015-12-27 ENCOUNTER — Encounter: Payer: Self-pay | Admitting: Family Medicine

## 2015-12-27 ENCOUNTER — Ambulatory Visit (INDEPENDENT_AMBULATORY_CARE_PROVIDER_SITE_OTHER): Payer: Medicare Other | Admitting: Family Medicine

## 2015-12-27 ENCOUNTER — Telehealth: Payer: Self-pay | Admitting: Hematology and Oncology

## 2015-12-27 VITALS — BP 116/80 | HR 66 | Temp 98.2°F | Wt 230.2 lb

## 2015-12-27 DIAGNOSIS — J320 Chronic maxillary sinusitis: Secondary | ICD-10-CM | POA: Diagnosis not present

## 2015-12-27 DIAGNOSIS — I2510 Atherosclerotic heart disease of native coronary artery without angina pectoris: Secondary | ICD-10-CM

## 2015-12-27 DIAGNOSIS — T451X5S Adverse effect of antineoplastic and immunosuppressive drugs, sequela: Secondary | ICD-10-CM | POA: Diagnosis not present

## 2015-12-27 DIAGNOSIS — Z8619 Personal history of other infectious and parasitic diseases: Secondary | ICD-10-CM | POA: Diagnosis not present

## 2015-12-27 DIAGNOSIS — A0472 Enterocolitis due to Clostridium difficile, not specified as recurrent: Secondary | ICD-10-CM | POA: Diagnosis not present

## 2015-12-27 DIAGNOSIS — I110 Hypertensive heart disease with heart failure: Secondary | ICD-10-CM | POA: Diagnosis not present

## 2015-12-27 DIAGNOSIS — G620 Drug-induced polyneuropathy: Secondary | ICD-10-CM | POA: Diagnosis not present

## 2015-12-27 DIAGNOSIS — I5022 Chronic systolic (congestive) heart failure: Secondary | ICD-10-CM | POA: Diagnosis not present

## 2015-12-27 NOTE — Telephone Encounter (Signed)
Left message re 12/7 appointments. Schedule mailed.

## 2015-12-27 NOTE — Progress Notes (Signed)
Subjective:    Patient ID: Diamond Boyd, female    DOB: 1940/02/25, 75 y.o.   MRN: [id number]  HPI  [name] is a 75 year old female who presents today with sinus pressure that has been ongoing but has increased over the past week.    She has a history of chronic sinusitis and has been cleared for sinus surgery by cardiology.  Dr. Redmond Baseman (ENT) provided treatment for chronic sinus infection with antibiotic treatment for relief symptoms previously.  She was placed on extended antibiotic therapy for sinusitis but unfortunately develop C-diff where she was hospitalized from 12/11/15 to 12/16/15 and antibiotic therapy for sinusitis was stopped and she started vancomycin. She was evaluated 6 days ago and reported frequent, loose bowel movements that were decreasing in frequency and volume. Today, she reports that her loose bowel movements continue to improve and she plans to complete vancomycin as directed and continue use of probiotic.  Today, she reports associated symptom of chronic nonproductive cough that is related to post nasal drip.    She is currently undergoing treatment for lymphoma with her next appointment and PET scan scheduled for December.  She is scheduled for a sleep study in January. No associated fever today. Associated symptom of decreased appetite and  is present.  Treatment at home includes Tylenol 500 mg one dose that provided moderate benefit.   Review of Systems  Constitutional: Negative for chills and fever.  HENT: Positive for congestion, postnasal drip and sinus pressure. Negative for sore throat.   Respiratory: Positive for cough. Negative for shortness of breath and wheezing.   Cardiovascular: Negative for chest pain and palpitations.  Gastrointestinal: Negative for abdominal pain, diarrhea, nausea and vomiting.  Genitourinary: Negative for dysuria.  Musculoskeletal: Negative for myalgias.  Skin: Negative for rash.  Neurological: Negative for dizziness,  light-headedness and headaches.   Past Medical History:  Diagnosis Date  . Anemia    hx blood tx  . Benign essential HTN   . Cancer ([facility])    skin cancer- basal cell on arm / colon 2011  . Chronic systolic CHF (congestive heart failure), NYHA class 2 ([facility]) 01/2015  . Colon cancer ([facility])    2011, s/p surgery, in remission  . Colon polyps   . Degenerative disk disease    Thoracic spine  . Depression   . 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 Generalized weakness 05/11/2015  . GERD (gastroesophageal reflux disease)   . Heart murmur   . hodgkins lymphoma   . Hypothyroidism   . Lymphoma ([facility])   . Neuropathy due to chemotherapeutic drug ([facility]) 06/19/2014  . NICM (nonischemic cardiomyopathy) ([facility]) 01/2015  . Osteoarthritis    hands  . 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 PAF (paroxysmal atrial fibrillation) ([facility])   . Peripheral vascular disease ([facility])   . Pneumonia      Social History   Social History  . Marital status: Married    Spouse name: N/A  . Number of children: 2  . Years of education: N/A   Occupational History  . Retired  Retired   Social History Main Topics  . Smoking status: Never Smoker  . Smokeless tobacco: Never Used     Comment: Second-hand exposure through father.  . Alcohol use No  . Drug use: No  . Sexual activity: No   Other Topics Concern  . Not on file  Social History Narrative   Originally from [place]. Always lived in [place]. Previously has traveled to [facility], [place] & up [employer] to [place]. No international travel. No pets currently. Remote parakeet exposure with her children. 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    Lives at home with husband. Weakness due to chemo, but otherwise independant       Past Surgical History:  Procedure Laterality Date  . BONE MARROW BIOPSY  05/27/13  . BREAST BIOPSY  1996  . CARDIAC CATHETERIZATION N/A  01/25/2015   Procedure: Left Heart Cath and Coronary Angiography;  Surgeon: Peter M Martinique, MD;  Location: [facility];  Service: Cardiovascular;  Laterality: N/A;  . CATARACT EXTRACTION, BILATERAL  2016  . CHOLECYSTECTOMY    . COLECTOMY  8/11  . COLONOSCOPY W/ BIOPSIES    . DG BIOPSY LUNG    . LYMPH NODE BIOPSY N/A 11/01/2014   Procedure: MEDIASTINAL LYMPH NODE BIOPSY;  Surgeon: Grace Isaac, MD;  Location: [facility];  Service: Thoracic;  Laterality: N/A;  . MEDIASTINOSCOPY N/A 11/01/2014   Procedure: MEDIASTINOSCOPY;  Surgeon: Grace Isaac, MD;  Location: [facility];  Service: Thoracic;  Laterality: N/A;  . PLANTAR FASCIA RELEASE    . port-a-cath insertion    . SPLENECTOMY     lymphoma  . TENDON RELEASE     Right thumb   . VAGINAL HYSTERECTOMY    . VENTRAL HERNIA REPAIR  1998  . VIDEO BRONCHOSCOPY WITH ENDOBRONCHIAL ULTRASOUND N/A 10/17/2014   Procedure: VIDEO BRONCHOSCOPY WITH ENDOBRONCHIAL ULTRASOUND;  Surgeon: Javier Glazier, MD;  Location: [facility];  Service: Thoracic;  Laterality: N/A;  . VIDEO BRONCHOSCOPY WITH ENDOBRONCHIAL ULTRASOUND N/A 11/01/2014   Procedure: VIDEO BRONCHOSCOPY WITH ENDOBRONCHIAL ULTRASOUND;  Surgeon: Grace Isaac, MD;  Location: [facility];  Service: Thoracic;  Laterality: N/A;    Family History  Problem Relation Age of Onset  . Coronary artery disease Mother   . Diabetes Mother   . Fibromyalgia Daughter     chronic pain   . COPD Daughter   . Diabetes Brother   . Asthma Daughter   . Rheum arthritis Brother   . Clotting disorder Brother   . Alcohol abuse Sister   . Colon cancer Neg Hx   . Colon polyps Neg Hx   . Stomach cancer Neg Hx   . Rectal cancer Neg Hx   . Ulcerative colitis Neg Hx   . Crohn's disease Neg Hx     Allergies  Allergen Reactions  . Achromycin [Tetracycline Hcl] Other (See Comments)    Pt does not remember reaction  . Allopurinol Other (See Comments)    REACTION: Unsure of reaction happene years ago  . Astelin  [Azelastine Hcl] Other (See Comments)    Reaction unknown  . Cephalexin Other (See Comments)    REACTION: unsure of reaction happened yrs ago.  . Codeine Other (See Comments)    REACTION: abd. pain  . Lisinopril Cough  . Meloxicam Other (See Comments)    REACTION: GI symptoms  . Minocycline Other (See Comments)    Abdominal pain  . Nabumetone Other (See Comments)    REACTION: reaction not known  . Nyquil [Pseudoeph-Doxylamine-Dm-Apap] Hives  . Sulfa Antibiotics Other (See Comments)    Gi side eff   . Zolpidem Tartrate Other (See Comments)    REACTION: feels too drugged  . Buspar [Buspirone Hcl] Other (See Comments)    Dizziness, and not as effective for anxiety  . Ciprofloxacin  Rash    Current Outpatient Prescriptions on File Prior to Visit  Medication Sig Dispense Refill  . acetaminophen (TYLENOL) 650 MG CR tablet Take 650 mg by mouth every 8 (eight) hours as needed for pain or fever.     . Acidophilus Lactobacillus CAPS Take 1 capsule by mouth 3 (three) times daily. 90 capsule 0  . acyclovir (ZOVIRAX) 400 MG tablet Take 1 tablet (400 mg total) by mouth daily. 90 tablet 3  . Albuterol Sulfate (PROAIR RESPICLICK) 992 (90 Base) MCG/ACT AEPB Inhale 1-2 puffs into the lungs every 6 (six) hours as needed (SOB, wheezing).     . Calcium Carbonate-Vitamin D (CALCIUM 600+D) 600-400 MG-UNIT per tablet Take 1 tablet by mouth every evening.     . celecoxib (CELEBREX) 200 MG capsule Take 1 capsule (200 mg total) by mouth daily as needed for moderate pain. (Patient taking differently: Take 200 mg by mouth every other day. ) 90 capsule 3  . Cholecalciferol (VITAMIN D-3) 1000 UNITS CAPS Take 1,000 Units by mouth daily.    Marland Kitchen docusate sodium (COLACE) 100 MG capsule Take 100 mg by mouth at bedtime.    Marland Kitchen estradiol (ESTRACE) 1 MG tablet Take 1 tablet (1 mg total) by mouth daily. 90 tablet 3  . fexofenadine (ALLEGRA) 180 MG tablet TAKE 1 TABLET (180 MG TOTAL) BY MOUTH DAILY. (*OTC)  3  . FLUoxetine  (PROZAC) 20 MG capsule Take 1 capsule (20 mg total) by mouth daily. 90 capsule 3  . fluticasone (FLONASE) 50 MCG/ACT nasal spray Place 2 sprays into both nostrils daily. (Patient taking differently: Place 2 sprays into both nostrils 2 (two) times daily. ) 48 g 3  . furosemide (LASIX) 20 MG tablet Take 1 tablet (20 mg total) by mouth daily. Take 1 tab by mouth daily for weight gain. 90 tablet 3  . gabapentin (NEURONTIN) 300 MG capsule Take 1 capsule (300 mg total) by mouth 2 (two) times daily. 180 capsule 3  . levothyroxine (SYNTHROID, LEVOTHROID) 112 MCG tablet Take 1 tablet (112 mcg total) by mouth daily before breakfast. 90 tablet 3  . lidocaine (XYLOCAINE) 5 % ointment Apply 1 application topically 3 (three) times daily as needed. To rectal area 35.44 g 1  . lidocaine-prilocaine (EMLA) cream Apply 1 application topically as needed (For port-a-cath.). Reported on 02/22/2015    . losartan (COZAAR) 25 MG tablet Take 1 tablet (25 mg total) by mouth daily. 90 tablet 3  . metoprolol (LOPRESSOR) 50 MG tablet TAKE 1 TABLET (50 MG TOTAL) BY MOUTH 2 (TWO) TIMES DAILY. 180 tablet 3  . Omega-3 350 MG CAPS Take 1 capsule by mouth daily. Reported on 02/22/2015    . ondansetron (ZOFRAN) 8 MG tablet Take 8 mg by mouth every 6 (six) hours as needed for nausea or vomiting. Reported on 03/05/2015    . polyethylene glycol (MIRALAX / GLYCOLAX) packet Take 17 g by mouth at bedtime.    . prochlorperazine (COMPAZINE) 10 MG tablet Take 10 mg by mouth every 6 (six) hours as needed for nausea or vomiting. Reported on 03/05/2015    . psyllium (METAMUCIL) 58.6 % packet Take 1-3 packets by mouth at bedtime.     . simvastatin (ZOCOR) 40 MG tablet Take 1 tablet (40 mg total) by mouth at bedtime. 90 tablet 3   Current Facility-Administered Medications on File Prior to Visit  Medication Dose Route Frequency Provider Last Rate Last Dose  . sodium chloride 0.9 % injection 10 mL  10 mL Intravenous PRN  [name], MD   10 mL at 12/20/15  1436    BP 116/80 (BP Location: Left Arm, Patient Position: Sitting, Cuff Size: Normal)   Pulse 66   Temp 98.2 F (36.8 C) (Oral)   Wt 230 lb 3.2 oz (104.4 kg)   LMP 02/10/1970   SpO2 96%   BMI 38.31 kg/m       Objective:   Physical Exam  Constitutional: She is oriented to person, place, and time. She appears well-developed and well-nourished.  Obese  HENT:  Right Ear: Tympanic membrane normal.  Left Ear: Tympanic membrane normal.  Nose: No rhinorrhea. Right sinus exhibits no maxillary sinus tenderness and no frontal sinus tenderness. Left sinus exhibits no maxillary sinus tenderness and no frontal sinus tenderness.  Eyes: Pupils are equal, round, and reactive to light. No scleral icterus.  Neck: Neck supple.  Cardiovascular: Normal rate and regular rhythm.   Pulmonary/Chest: Effort normal and breath sounds normal. She has no wheezes. She has no rales.  Abdominal: Soft. Bowel sounds are normal. There is no tenderness.  Lymphadenopathy:    She has no cervical adenopathy.  Neurological: She is alert and oriented to person, place, and time.  Skin: Skin is warm and dry.  Psychiatric: She has a normal mood and affect.       Assessment & Plan:  1. Chronic maxillary sinusitis Suspect that symptoms are related to chronic sinusitis. We discussed the avoidance of antibiotics due to recent history and current treatment for C. Diff. She is afebrile and did not exhibit sinus tenderness with palpation.  Advised her to rest, drink plenty of fluids, Allegra, Flonase, and Tylenol for discomfort and contact ENT for follow up regarding treatment for sinusitis. She will call ENT tomorrow and schedule a follow up appointment.  Further advised that she follow up with her PCP if symptoms do not improve, worsen, or she develops a fever >101. Patient voiced understanding and agreed with plan.  2. History of Clostridium difficile colitis Continue Vancomycin and complete full therapy. Advised continued  use of probiotic. Return precautions advised.  Also advised her to monitor her intake and drink Ensure if needed.   Patient and husband agreed with plan and will follow up with ENT, PCP, and oncology as scheduled.  Delano Metz, FNP-C  Scheduled appointment with oncology for therapy and PET scan in December.  Cardiology sleep study has been scheduled.

## 2015-12-27 NOTE — Telephone Encounter (Signed)
Patient Name: Diamond Boyd DOB: 02-23-1940 Initial Comment Caller says, wife, oxygen reading 94 now, no energy today or yesterday, no appetite. She had a fever yesterday 101.7 Nurse Assessment Nurse: Ronnald Ramp, RN, Miranda Date/Time (Eastern Time): 12/27/2015 1:47:50 PM Confirm and document reason for call. If symptomatic, describe symptoms. You must click the next button to save text entered. ---Caller states his wife has had increased nasal congestion and blood in mucus when she blows her nose. Yesterday she started having decreased appetite and energy. Also with fever yesterday of 101.7 but today is 98.7. Has the patient traveled out of the country within the last 30 days? ---Not Applicable Does the patient have any new or worsening symptoms? ---Yes Will a triage be completed? ---Yes Related visit to physician within the last 2 weeks? ---No Does the PT have any chronic conditions? (i.e. diabetes, asthma, etc.) ---Yes List chronic conditions. ---Decreased immune system - low B cells, C-Diff, Is this a behavioral health or substance abuse call? ---No Guidelines Guideline Title Affirmed Question Affirmed Notes Sinus Pain or Congestion [1] Fever > 100.5 F (38.1 C) AND [2] diabetes mellitus or weak immune system (e.g., HIV positive, cancer chemo, splenectomy, organ transplant, chronic steroids) Final Disposition User See Physician within 4 Hours (or PCP triage) Ronnald Ramp, RN, Miranda Comments NO appt available with PCP or at primary office, appt scheduled with Almira Coaster, NP at [facility] for 4pm Referrals GO TO [facility] - SPECIFY Disagree/Comply: Comply

## 2015-12-27 NOTE — Patient Instructions (Signed)
Please complete Vancomycin for C. Diff colitis and continue probiotic. Also, you may use Tylenol for discomfort as needed. Recommend that you contact your ENT provider Dr. Redmond Baseman tomorrow to discuss future treatment for chronic sinusitis.  Also, if symptoms do not improve in 2 to 3 days, worsen, or you develop a fever >101 or new symptoms, please seek care with your physician.  Rest, drink plenty of water that is enough to keep your urine pale yellow or clear.

## 2015-12-27 NOTE — Progress Notes (Signed)
Pre visit review using our clinic review tool, if applicable. No additional management support is needed unless otherwise documented below in the visit note. 

## 2015-12-31 ENCOUNTER — Other Ambulatory Visit: Payer: Self-pay | Admitting: *Deleted

## 2015-12-31 ENCOUNTER — Ambulatory Visit (HOSPITAL_BASED_OUTPATIENT_CLINIC_OR_DEPARTMENT_OTHER): Payer: Medicare Other | Admitting: Nurse Practitioner

## 2015-12-31 ENCOUNTER — Telehealth: Payer: Self-pay | Admitting: *Deleted

## 2015-12-31 ENCOUNTER — Ambulatory Visit (HOSPITAL_BASED_OUTPATIENT_CLINIC_OR_DEPARTMENT_OTHER): Payer: Medicare Other

## 2015-12-31 VITALS — BP 106/54 | HR 86 | Temp 99.4°F | Resp 18 | Ht 65.0 in | Wt 228.9 lb

## 2015-12-31 DIAGNOSIS — R040 Epistaxis: Secondary | ICD-10-CM | POA: Diagnosis not present

## 2015-12-31 DIAGNOSIS — E860 Dehydration: Secondary | ICD-10-CM

## 2015-12-31 DIAGNOSIS — C8228 Follicular lymphoma grade III, unspecified, lymph nodes of multiple sites: Secondary | ICD-10-CM

## 2015-12-31 DIAGNOSIS — A0472 Enterocolitis due to Clostridium difficile, not specified as recurrent: Secondary | ICD-10-CM

## 2015-12-31 DIAGNOSIS — R509 Fever, unspecified: Secondary | ICD-10-CM | POA: Diagnosis not present

## 2015-12-31 DIAGNOSIS — G8929 Other chronic pain: Secondary | ICD-10-CM

## 2015-12-31 DIAGNOSIS — C8140 Lymphocyte-rich classical Hodgkin lymphoma, unspecified site: Secondary | ICD-10-CM

## 2015-12-31 DIAGNOSIS — R634 Abnormal weight loss: Secondary | ICD-10-CM | POA: Diagnosis not present

## 2015-12-31 DIAGNOSIS — F411 Generalized anxiety disorder: Secondary | ICD-10-CM | POA: Diagnosis not present

## 2015-12-31 DIAGNOSIS — R530 Neoplastic (malignant) related fatigue: Secondary | ICD-10-CM | POA: Insufficient documentation

## 2015-12-31 DIAGNOSIS — E46 Unspecified protein-calorie malnutrition: Secondary | ICD-10-CM

## 2015-12-31 DIAGNOSIS — E8809 Other disorders of plasma-protein metabolism, not elsewhere classified: Secondary | ICD-10-CM | POA: Insufficient documentation

## 2015-12-31 DIAGNOSIS — G4452 New daily persistent headache (NDPH): Secondary | ICD-10-CM

## 2015-12-31 DIAGNOSIS — M546 Pain in thoracic spine: Secondary | ICD-10-CM

## 2015-12-31 LAB — CBC WITH DIFFERENTIAL/PLATELET
BASO%: 1 % (ref 0.0–2.0)
Basophils Absolute: 0.1 10*3/uL (ref 0.0–0.1)
EOS%: 0 % (ref 0.0–7.0)
Eosinophils Absolute: 0 10*3/uL (ref 0.0–0.5)
HCT: 35.6 % (ref 34.8–46.6)
HGB: 12 g/dL (ref 11.6–15.9)
LYMPH%: 47.8 % (ref 14.0–49.7)
MCH: 30.8 pg (ref 25.1–34.0)
MCHC: 33.7 g/dL (ref 31.5–36.0)
MCV: 91.3 fL (ref 79.5–101.0)
MONO#: 1.5 10*3/uL — ABNORMAL HIGH (ref 0.1–0.9)
MONO%: 24.6 % — AB (ref 0.0–14.0)
NEUT#: 1.6 10*3/uL (ref 1.5–6.5)
NEUT%: 26.6 % — AB (ref 38.4–76.8)
Platelets: 30 10*3/uL — ABNORMAL LOW (ref 145–400)
RBC: 3.9 10*6/uL (ref 3.70–5.45)
RDW: 19.8 % — ABNORMAL HIGH (ref 11.2–14.5)
WBC: 6.1 10*3/uL (ref 3.9–10.3)
lymph#: 2.9 10*3/uL (ref 0.9–3.3)
nRBC: 6 % — ABNORMAL HIGH (ref 0–0)

## 2015-12-31 LAB — COMPREHENSIVE METABOLIC PANEL
ALT: 23 U/L (ref 0–55)
AST: 48 U/L — AB (ref 5–34)
Albumin: 2.6 g/dL — ABNORMAL LOW (ref 3.5–5.0)
Alkaline Phosphatase: 337 U/L — ABNORMAL HIGH (ref 40–150)
Anion Gap: 10 mEq/L (ref 3–11)
BUN: 18.4 mg/dL (ref 7.0–26.0)
CALCIUM: 9.7 mg/dL (ref 8.4–10.4)
CHLORIDE: 101 meq/L (ref 98–109)
CO2: 23 mEq/L (ref 22–29)
Creatinine: 0.8 mg/dL (ref 0.6–1.1)
EGFR: 69 mL/min/{1.73_m2} — ABNORMAL LOW (ref >90)
GLUCOSE: 144 mg/dL — AB (ref 70–140)
POTASSIUM: 4.1 meq/L (ref 3.5–5.1)
SODIUM: 134 meq/L — AB (ref 136–145)
Total Bilirubin: 0.86 mg/dL (ref 0.20–1.20)
Total Protein: 7 g/dL (ref 6.4–8.3)

## 2015-12-31 LAB — TECHNOLOGIST REVIEW

## 2015-12-31 MED ORDER — LORAZEPAM 0.5 MG PO TABS: 0.5000 mg | ORAL_TABLET | Freq: Two times a day (BID) | ORAL | 0 refills | Status: DC | PRN

## 2015-12-31 MED ORDER — SODIUM CHLORIDE 0.9% FLUSH
10.0000 mL | Freq: Once | INTRAVENOUS | Status: AC
  Administered 2015-12-31: 10 mL via INTRAVENOUS
  Filled 2015-12-31: qty 10

## 2015-12-31 MED ORDER — MORPHINE SULFATE 4 MG/ML IJ SOLN
2.0000 mg | Freq: Once | INTRAMUSCULAR | Status: DC
  Filled 2015-12-31: qty 1

## 2015-12-31 MED ORDER — ACETAMINOPHEN 325 MG PO TABS
ORAL_TABLET | ORAL | Status: AC
  Filled 2015-12-31: qty 2

## 2015-12-31 MED ORDER — SODIUM CHLORIDE 0.9 % IV SOLN
INTRAVENOUS | Status: AC
  Administered 2015-12-31: 14:00:00 via INTRAVENOUS

## 2015-12-31 MED ORDER — HEPARIN SOD (PORK) LOCK FLUSH 100 UNIT/ML IV SOLN
500.0000 [IU] | Freq: Once | INTRAVENOUS | Status: AC
  Administered 2015-12-31: 500 [IU] via INTRAVENOUS
  Filled 2015-12-31: qty 5

## 2015-12-31 MED ORDER — ACETAMINOPHEN 325 MG PO TABS
650.0000 mg | ORAL_TABLET | Freq: Once | ORAL | Status: AC
  Administered 2015-12-31: 650 mg via ORAL

## 2015-12-31 NOTE — Assessment & Plan Note (Signed)
Albumin has decreased from 2.8 down to 2.6.  Patient was encouraged to push protein in her diet as much as possible.

## 2015-12-31 NOTE — Assessment & Plan Note (Signed)
Patient's sodium is down to 134 today.  Patient does appear mildly dehydrated; even though she is drinking only water during the exam.  Patient will receive IV fluid rehydration while at the [facility] today.

## 2015-12-31 NOTE — Assessment & Plan Note (Signed)
Both patient and her husband state that patient has become increasingly fatigued and weak recently.  Patient states that she spends a good deal of time sitting or in her bed.  She has minimal tolerance ability of any physical activity at home whatsoever.  Patient's husband stated that they have a home health physical therapist to come into the home twice per week and a sister.  Patient and walking about the house.  This provider as the patient if she does her exercises or walks about the house.  On the other days of the week-and patient stated that she has not done so in the last few weeks.  She confirms she does have a walker at home; but she has not been using it.  Patient's husband states that patient becomes extremely fatigued, slightly winded, and "in a tizzy" when she becomes frustrated or tired.  Most likely, patient is suffering from chronic issues with fatigue and a subsequent deconditioning.  Recommended that patient increase her level of activity and walk about the house is much as possible to gain strength.

## 2015-12-31 NOTE — Patient Instructions (Signed)
Dehydration, Adult Dehydration is a condition in which there is not enough fluid or water in the body. This happens when you lose more fluids than you take in. Important organs, such as the kidneys, brain, and heart, cannot function without a proper amount of fluids. Any loss of fluids from the body can lead to dehydration. Dehydration can range from mild to severe. This condition should be treated right away to prevent it from becoming severe. What are the causes? This condition may be caused by:  Vomiting.  Diarrhea.  Excessive sweating, such as from heat exposure or exercise.  Not drinking enough fluid, especially:  When ill.  While doing activity that requires a lot of energy.  Excessive urination.  Fever.  Infection.  Certain medicines, such as medicines that cause the body to lose excess fluid (diuretics).  Inability to access safe drinking water.  Reduced physical ability to get adequate water and food. What increases the risk? This condition is more likely to develop in people:  Who have a poorly controlled long-term (chronic) illness, such as diabetes, heart disease, or kidney disease.  Who are age 75 or older.  Who are disabled.  Who live in a place with high altitude.  Who play endurance sports. What are the signs or symptoms? Symptoms of mild dehydration may include:  Thirst.  Dry lips.  Slightly dry mouth.  Dry, warm skin.  Dizziness. Symptoms of moderate dehydration may include:  Very dry mouth.  Muscle cramps.  Dark urine. Urine may be the color of tea.  Decreased urine production.  Decreased tear production.  Heartbeat that is irregular or faster than normal (palpitations).  Headache.  Light-headedness, especially when you stand up from a sitting position.  Fainting (syncope). Symptoms of severe dehydration may include:  Changes in skin, such as:  Cold and clammy skin.  Blotchy (mottled) or pale skin.  Skin that does not  quickly return to normal after being lightly pinched and released (poor skin turgor).  Changes in body fluids, such as:  Extreme thirst.  No tear production.  Inability to sweat when body temperature is high, such as in hot weather.  Very little urine production.  Changes in vital signs, such as:  Weak pulse.  Pulse that is more than 100 beats a minute when sitting still.  Rapid breathing.  Low blood pressure.  Other changes, such as:  Sunken eyes.  Cold hands and feet.  Confusion.  Lack of energy (lethargy).  Difficulty waking up from sleep.  Short-term weight loss.  Unconsciousness. How is this diagnosed? This condition is diagnosed based on your symptoms and a physical exam. Blood and urine tests may be done to help confirm the diagnosis. How is this treated? Treatment for this condition depends on the severity. Mild or moderate dehydration can often be treated at home. Treatment should be started right away. Do not wait until dehydration becomes severe. Severe dehydration is an emergency and it needs to be treated in a hospital. Treatment for mild dehydration may include:  Drinking more fluids.  Replacing salts and minerals in your blood (electrolytes) that you may have lost. Treatment for moderate dehydration may include:  Drinking an oral rehydration solution (ORS). This is a drink that helps you replace fluids and electrolytes (rehydrate). It can be found at pharmacies and retail stores. Treatment for severe dehydration may include:  Receiving fluids through an IV tube.  Receiving an electrolyte solution through a feeding tube that is passed through your nose and into  your stomach (nasogastric tube, or NG tube).  Correcting any abnormalities in electrolytes.  Treating the underlying cause of dehydration. Follow these instructions at home:  If directed by your health care provider, drink an ORS:  Make an ORS by following instructions on the  package.  Start by drinking small amounts, about  cup (120 mL) every 5-10 minutes.  Slowly increase how much you drink until you have taken the amount recommended by your health care provider.  Drink enough clear fluid to keep your urine clear or pale yellow. If you were told to drink an ORS, finish the ORS first, then start slowly drinking other clear fluids. Drink fluids such as:  Water. Do not drink only water. Doing that can lead to having too little salt (sodium) in the body (hyponatremia).  Ice chips.  Fruit juice that you have added water to (diluted fruit juice).  Low-calorie sports drinks.  Avoid:  Alcohol.  Drinks that contain a lot of sugar. These include high-calorie sports drinks, fruit juice that is not diluted, and soda.  Caffeine.  Foods that are greasy or contain a lot of fat or sugar.  Take over-the-counter and prescription medicines only as told by your health care provider.  Do not take sodium tablets. This can lead to having too much sodium in the body (hypernatremia).  Eat foods that contain a healthy balance of electrolytes, such as bananas, oranges, potatoes, tomatoes, and spinach.  Keep all follow-up visits as told by your health care provider. This is important. Contact a health care provider if:  You have abdominal pain that:  Gets worse.  Stays in one area (localizes).  You have a rash.  You have a stiff neck.  You are more irritable than usual.  You are sleepier or more difficult to wake up than usual.  You feel weak or dizzy.  You feel very thirsty.  You have urinated only a small amount of very dark urine over 6-8 hours. Get help right away if:  You have symptoms of severe dehydration.  You cannot drink fluids without vomiting.  Your symptoms get worse with treatment.  You have a fever.  You have a severe headache.  You have vomiting or diarrhea that:  Gets worse.  Does not go away.  You have blood or green matter  (bile) in your vomit.  You have blood in your stool. This may cause stool to look black and tarry.  You have not urinated in 6-8 hours.  You faint.  Your heart rate while sitting still is over 100 beats a minute.  You have trouble breathing. This information is not intended to replace advice given to you by your health care provider. Make sure you discuss any questions you have with your health care provider. Document Released: 01/27/2005 Document Revised: 08/24/2015 Document Reviewed: 03/23/2015 Elsevier Interactive Patient Education  2017 [employer].   Fever, Adult A fever is an increase in the body's temperature. It is usually defined as a temperature of 100F (38C) or higher. Brief mild or moderate fevers generally have no long-term effects, and they often do not require treatment. Moderate or high fevers may make you feel uncomfortable and can sometimes be a sign of a serious illness or disease. The sweating that may occur with repeated or prolonged fever may also cause dehydration. Fever is confirmed by taking a temperature with a thermometer. A measured temperature can vary with:  Age.  Time of day.  Location of the thermometer:  Mouth (oral).  Rectum (rectal).  Ear (tympanic).  Underarm (axillary).  Forehead (temporal). Follow these instructions at home: Pay attention to any changes in your symptoms. Take these actions to help with your condition:  Take over-the counter and prescription medicines only as told by your health care provider. Follow the dosing instructions carefully.  If you were prescribed an antibiotic medicine, take it as told by your health care provider. Do not stop taking the antibiotic even if you start to feel better.  Rest as needed.  Drink enough fluid to keep your urine clear or pale yellow. This helps to prevent dehydration.  Sponge yourself or bathe with room-temperature water to help reduce your body temperature as needed. Do not  use ice water.  Do not overbundle yourself in blankets or heavy clothes. Contact a health care provider if:  You vomit.  You cannot eat or drink without vomiting.  You have diarrhea.  You have pain when you urinate.  Your symptoms do not improve with treatment.  You develop new symptoms.  You develop excessive weakness. Get help right away if:  You have shortness of breath or have trouble breathing.  You are dizzy or you faint.  You are disoriented or confused.  You develop signs of dehydration, such as a dry mouth, decreased urination, or paleness.  You develop severe pain in your abdomen.  You have persistent vomiting or diarrhea.  You develop a skin rash.  Your symptoms suddenly get worse. This information is not intended to replace advice given to you by your health care provider. Make sure you discuss any questions you have with your health care provider. Document Released: 07/23/2000 Document Revised: 07/05/2015 Document Reviewed: 03/23/2014 Elsevier Interactive Patient Education  2017 [employer].

## 2015-12-31 NOTE — Assessment & Plan Note (Signed)
Patient received her last IVIG infusion on 12/20/2015.  She is scheduled to return on 01/16/2016 for a pet scan.  She is scheduled for labs, flush, visit, and her next IVIG on 01/17/2016.

## 2015-12-31 NOTE — Telephone Encounter (Signed)
Pt called to say she has been having nose bleed and fevers everyday since being discharged.   Will come at 1100 for lab and 1130 to see Selena Lesser, NP

## 2015-12-31 NOTE — Assessment & Plan Note (Signed)
Patient states that she continues to drink fluids; and has minimal appetite and continues to lose weight.  She has lost 7 pounds since her last weight check.  She was encouraged to eat multiple small meals throughout the day; and to push protein as well.

## 2015-12-31 NOTE — Assessment & Plan Note (Addendum)
Patient states that she has had chronic fevers to maximum of 102.8 intermittently since prior to her diagnosis.  She also has a history of chronic sinusitis; and has been on chronic antibiotics as well.  She has since developed C. difficile infection recently; and was just discharged from the hospital, approximate 2 weeks ago.  She continues with her nightly fevers as well; and states that Tylenol helps relieve the fevers.  She states that she has one vancomycin oral tablet (take today.  She states that her last diarrhea was either Friday or Saturday of last week.  Patient denies any URI symptoms at the present time.  She denies any UTI symptoms as well.  She denies any cough.  On initial exam today.  Patient's stricture was 98.8; the patient was complaining of being significantly chilled while in the exam room.  She appeared very anxious and was almost hyperventilating.  She was complaining of being cold and having back pain.  She was advised to try a calming her breathing; and was also given several warm blankets as well.  She was given Tylenol 650 mg as well.  Reviewed all findings with Dr. Alvy Bimler; and Dr. Alvy Bimler recommended that patient finish all of her vancomycin; and then to hold on any further antibiotics secondary to the issues with C. difficile in the past.  Also, she recommended that patient receive IV fluid rehydration and some pain medication.  Patient will be given morphine 2 mg IV in addition to IV fluid rehydration.  The plan is for the patient to receive her IV fluids today; and then to reassess.  Patient does not appear in acute distress today. ___________________________________  Update: Patient's fever increased to 99.8 as max today at the [facility] today.  Patient received her IV fluids; and refused pain medication.  She appeared to feel much more comfortable and less anxious after receiving her IV fluid.

## 2015-12-31 NOTE — Assessment & Plan Note (Signed)
Alkaline phosphatase is increased from 293 up to 337 today.  Will review all labs with Dr. Alvy Bimler and continue to monitor.

## 2015-12-31 NOTE — Progress Notes (Signed)
SYMPTOM MANAGEMENT CLINIC    Chief Complaint: Dehydration, fever, pain  HPI:  Diamond Boyd 75 y.o. female diagnosed with Hodgkin's lymphoma.  Currently receiving IVIG therapy.   Oncology History   History of colon cancer   Staging form: Colon and Rectum, AJCC 7th Edition     Clinical: Stage IIA (T3, N0, M0) - Signed by [name], MD on 02/21/2014 History of hodgkin's lymphoma   Staging form: Lymphoid Neoplasms, AJCC 6th Edition     Clinical: Stage IV - Signed by [name], MD on 02/21/2014       History of Hodgkin's lymphoma   05/20/2013 Initial Diagnosis    Hodgkin lymphoma      06/27/2014 Imaging    Interval increase in metabolic activity of several small lymph nodes is concerning for lymphoma recurrence. Lymph nodes include a small right level 2 lymph node, right hilar lymph node, and right paratracheal lymph node      07/07/2014 Pathology Results     limited tissue from ultrasound-guided biopsy was nondiagnostic      07/07/2014 Procedure     ultrasound-guided biopsy was nondiagnostic      09/27/2014 Imaging    Repeat PET CT scan show diffuse disease concern for relapse.      10/17/2014 Pathology Results    Accession: [id number] Biopsy was nondiagnostic      10/17/2014 Procedure    She underwent bronchoscopy & EBUS & biopsy of  LN at Level 10L      11/01/2014 Surgery    She underwent cronchoscopy with endobronchial ultrasound, mediastinoscopy and biopsy       11/01/2014 Pathology Results    Accession: [id number] biopsy showed no evidence of lymphoma, only granulomas.      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[place])      12/13/2014 Imaging    PET scan showed diffuse disease      12/28/2014 - 12/30/2014 Hospital Admission    She was admitted to the hospital for cycle one of R-ICE      01/18/2015 - 01/20/2015 Chemotherapy    She was admitted to the hospital for cycle 2 of R-ICE      01/20/2015 - 01/22/2015 Hospital Admission    She was then admitted to [facility] with respiratory failure/fluid overload and was noted to have mild cardiomyopathy      01/23/2015 - 01/27/2015 Hospital Admission    she was admitted to the hospital with new onset atrial fibrillation with rapid ventricular response and was found to have evidence of myocardial ischemia. She was discharged on medical management and oral anticoagulation therapy      01/25/2015 Procedure    Left heart cath showed non-obstructive lesions: Prox RCA lesion, 40% stenosed.   Prox LAD to Mid LAD lesion, 30% stenosed. Ost LM lesion, 25% stenosed  01/30/2015 Imaging    PET Ct scan showed near complete resolution of all disease      03/12/2015 - 09/10/2015 Chemotherapy    She received 3 doses of Rituximab every other month      07/10/2015 PET scan    PET showed Improved metabolic activity in the bony lesions with mild persistent hilar LN with increased SUV uptake      09/06/2015 Imaging    Ct scan showed interval development of mild mucosal thickening involving left maxillary and bilateral ethmoid sinuses consistent with mild sinusitis.      12/10/2015 - 12/16/2015 Hospital Admission    She was admitted to the hospital due to diarrhea and colitis and was found to have Clostridium difficile       Review of Systems  Constitutional: Positive for chills, fever, malaise/fatigue and weight loss.    Gastrointestinal: Positive for diarrhea.  Neurological: Positive for weakness.  All other systems reviewed and are negative.   Past Medical History:  Diagnosis Date  . Anemia    hx blood tx  . Benign essential HTN   . Cancer ([place])    skin cancer- basal cell on arm / colon 2011  . Chronic systolic CHF (congestive heart failure), NYHA class 2 ([place]) 01/2015  . Colon cancer ([place])    2011, s/p surgery, in remission  . Colon polyps   . Degenerative disk disease    Thoracic spine  . Depression   . 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 Generalized weakness 05/11/2015  . GERD (gastroesophageal reflux disease)   . Heart murmur   . hodgkins lymphoma   . Hypothyroidism   . Lymphoma ([place])   . Neuropathy due to chemotherapeutic drug ([place]) 06/19/2014  . NICM (nonischemic cardiomyopathy) ([place]) 01/2015  . Osteoarthritis    hands  . 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 PAF (paroxysmal atrial fibrillation) ([place])   . Peripheral vascular disease ([place])   . Pneumonia     Past Surgical History:  Procedure Laterality Date  . BONE MARROW BIOPSY  05/27/13  . BREAST BIOPSY  1996  . CARDIAC CATHETERIZATION N/A 01/25/2015   Procedure: Left Heart Cath and Coronary Angiography;  Surgeon: Peter M Martinique, MD;  Location: [facility];  Service: Cardiovascular;  Laterality: N/A;  . CATARACT EXTRACTION, BILATERAL  2016  . CHOLECYSTECTOMY    . COLECTOMY  8/11  . COLONOSCOPY W/ BIOPSIES    . DG BIOPSY LUNG    . LYMPH NODE BIOPSY N/A 11/01/2014   Procedure: MEDIASTINAL LYMPH NODE BIOPSY;  Surgeon: Grace Isaac, MD;  Location: [place] [facility];  Service: Thoracic;  Laterality: N/A;  . MEDIASTINOSCOPY N/A 11/01/2014   Procedure: MEDIASTINOSCOPY;  Surgeon: Grace Isaac, MD;  Location: [facility];  Service: Thoracic;  Laterality: N/A;  . PLANTAR FASCIA RELEASE    . port-a-cath insertion    . SPLENECTOMY     lymphoma   . TENDON RELEASE     Right thumb   . VAGINAL HYSTERECTOMY    . VENTRAL HERNIA REPAIR  1998  . VIDEO BRONCHOSCOPY WITH ENDOBRONCHIAL ULTRASOUND N/A 10/17/2014   Procedure: VIDEO BRONCHOSCOPY WITH ENDOBRONCHIAL ULTRASOUND;  Surgeon: Javier Glazier, MD;  Location: [place];  Service: Thoracic;  Laterality: N/A;  . VIDEO BRONCHOSCOPY WITH ENDOBRONCHIAL ULTRASOUND N/A 11/01/2014   Procedure: VIDEO BRONCHOSCOPY WITH ENDOBRONCHIAL ULTRASOUND;  Surgeon: Grace Isaac, MD;  Location: [place];  Service: Thoracic;  Laterality: N/A;    has Colon cancer ([place]); History of B-cell lymphoma; HYPOTHYROIDISM; High cholesterol; Hyperphosphatemia; Anxiety state; Depression; PERIPHERAL VASCULAR DISEASE; Chronic maxillary sinusitis; Allergic rhinitis; GERD; OVERACTIVE BLADDER; MENOPAUSAL SYNDROME; OSTEOARTHRITIS; LEG EDEMA; THROAT PAIN, CHRONIC; SKIN CANCER, HX OF; COLONIC POLYPS, HX OF; Obesity; Hypothyroid; Varicose veins; Other asplenic status; History of anemia; Other screening mammogram; Routine gynecological examination; Colon cancer screening; Skin lesion of face; Thoracic back pain; Left knee pain; Encounter for Medicare annual wellness exam; Cystocele; Shingles; Other malaise and fatigue; Acute maxillary sinusitis; Hyperglycemia; Conjunctivitis, acute; Cold sore; History of Hodgkin's lymphoma; History of colon cancer; Drug-induced neutropenia ([place]); Candidal intertrigo; Atrophic vaginitis; Urinary tract infection; Anemia in chronic illness; Elevated liver enzymes; Peripheral neuropathy due to chemotherapy ([place]); Trochanteric bursitis of right hip; Neuropathy due to chemotherapeutic drug ([place]); Chronic lower back pain; Mediastinal lymphadenopathy; Preventive measure; Parotid mass; 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 [facility]); Grade 3b follicular lymphoma of lymph nodes of multiple regions ([place]); Immunocompromised status associated with infection ([place]); Fever in adult; CHF (congestive heart  failure) ([place]); Atrial fibrillation with rapid ventricular response ([place]); New onset a-fib ([place]); Hypertension; AKI (acute kidney injury) ([place]); Elevated troponin; Anemia in neoplastic disease; Thrombocytopenia ([place]); Rectal bleeding; Internal hemorrhoids; Chronic systolic CHF (congestive heart failure), NYHA class 2 ([place]); Benign essential HTN; PAF (paroxysmal atrial fibrillation) ([place]); Generalized weakness; Port catheter in place; Cough; Hypogammaglobulinemia, acquired ([place]); Physical deconditioning; Basal cell carcinoma; Chronic ethmoidal sinusitis; Hodgkin lymphoma ([place]); Neuropathy ([place]); S/P cataract surgery; S/P colon resection; Chronic rhinitis; Moderate persistent asthma; Colitis; C. difficile colitis; Dehydration; Neoplastic (malignant) related fatigue; Unintentional weight loss; Epistaxis; and Hypoalbuminemia due to protein-calorie malnutrition ([place]) on her problem list.    is allergic to achromycin [tetracycline hcl]; allopurinol; astelin [azelastine hcl]; cephalexin; codeine; lisinopril; meloxicam; minocycline; nabumetone; nyquil [pseudoeph-doxylamine-dm-apap]; sulfa antibiotics; zolpidem tartrate; buspar [buspirone hcl]; and ciprofloxacin.    Medication List       Accurate as of 12/31/15  4:29 PM. Always use your most recent med list.          acetaminophen 650 MG CR tablet Commonly known as:  TYLENOL Take 650 mg by mouth every 8 (eight) hours as needed for pain or fever.   Acidophilus Lactobacillus Caps Take 1 capsule by mouth 3 (three) times daily.   acyclovir 400 MG tablet Commonly known as:  ZOVIRAX Take 1 tablet (400 mg total) by mouth daily.   CALCIUM 600+D 600-400 MG-UNIT tablet Generic drug:  Calcium Carbonate-Vitamin D Take 1 tablet by mouth every evening.   celecoxib 200 MG capsule Commonly known as:  CELEBREX Take 1 capsule (200 mg total) by mouth daily as needed for moderate pain.   docusate sodium 100 MG capsule Commonly known as:  COLACE Take 100 mg by  mouth at bedtime.   estradiol 1 MG tablet Commonly known as:  ESTRACE Take 1 tablet (1 mg total) by mouth daily.   fexofenadine 180 MG tablet Commonly known as:  ALLEGRA TAKE 1 TABLET (180 MG TOTAL) BY MOUTH DAILY. (*OTC)   FLUoxetine 20 MG capsule Commonly known as:  PROZAC Take 1 capsule (20 mg total) by mouth daily.   fluticasone 50 MCG/ACT nasal spray Commonly known as:  FLONASE Place 2 sprays into both nostrils daily.   furosemide 20 MG tablet Commonly known as:  LASIX Take 1 tablet (20 mg total) by mouth daily. Take 1 tab by mouth daily for weight gain.   gabapentin 300 MG capsule Commonly known as:  NEURONTIN Take 1 capsule (300 mg total) by mouth 2 (two) times daily.   levothyroxine 112 MCG tablet Commonly known as:  SYNTHROID, LEVOTHROID Take 1 tablet (112 mcg total) by mouth daily before breakfast.   lidocaine 5 % ointment Commonly known as:  XYLOCAINE Apply 1 application topically 3 (three) times daily as needed. To rectal area   lidocaine-prilocaine cream Commonly known as:  EMLA Apply 1 application topically as needed (For port-a-cath.). Reported on 02/22/2015   LORazepam 0.5 MG tablet Commonly known as:  ATIVAN Take 1 tablet (0.5 mg total) by mouth every 12 (twelve) hours as needed for anxiety.   losartan 25 MG tablet Commonly known as:  COZAAR Take 1 tablet (25 mg total) by mouth daily.   metoprolol 50 MG tablet Commonly known as:  LOPRESSOR TAKE 1 TABLET (50 MG TOTAL) BY MOUTH 2 (TWO) TIMES DAILY.   Omega-3 350 MG Caps Take 1 capsule by mouth daily. Reported on 02/22/2015   ondansetron 8 MG tablet Commonly known as:  ZOFRAN Take 8 mg by mouth every 6 (six) hours as needed for nausea or vomiting. Reported on 03/05/2015   polyethylene glycol packet Commonly known as:  MIRALAX / GLYCOLAX Take 17 g by mouth at bedtime.   PROAIR RESPICLICK 108 (90 Base) MCG/ACT Aepb Generic drug:  Albuterol Sulfate Inhale 1-2 puffs into the lungs every 6 (six)  hours as needed (SOB, wheezing).   prochlorperazine 10 MG tablet Commonly known as:  COMPAZINE Take 10 mg by mouth every 6 (six) hours as needed for nausea or vomiting. Reported on 03/05/2015   psyllium 58.6 % packet Commonly known as:  METAMUCIL Take 1-3 packets by mouth at bedtime.   simvastatin 40 MG tablet Commonly known as:  ZOCOR Take 1 tablet (40 mg total) by mouth at bedtime.   Vitamin D-3 1000 units Caps Take 1,000 Units by mouth daily.        PHYSICAL EXAMINATION  Oncology Vitals 12/31/2015 12/31/2015  Height - 165 cm  Weight - 103.828 kg  Weight (lbs) - 228 lbs 14 oz  BMI (kg/m2) - 38.09 kg/m2  Temp 99.4 98.8  Pulse 86 87  Resp 18 18  Resp (Historical as of 09/11/11) - -  SpO2 96 100  BSA (m2) - 2.18 m2   BP Readings from Last 2 Encounters:  12/31/15 (!) 106/54  12/27/15 116/80    Physical Exam  Constitutional: She is oriented to person, place, and time. Vital signs are normal. She appears dehydrated. She appears unhealthy.  HENT:  Head: Normocephalic and atraumatic.  Mouth/Throat: Oropharynx is clear and moist.  Eyes: Conjunctivae and EOM are normal. Pupils are equal, round, and reactive to light. Right eye exhibits no discharge. Left eye exhibits no discharge. No scleral icterus.  Neck: Normal range of motion. Neck supple. No JVD present. No tracheal deviation present. No thyromegaly present.  Cardiovascular: Normal rate, regular rhythm, normal heart sounds and intact distal pulses.   Pulmonary/Chest: Effort normal and breath sounds normal. No respiratory distress. She has no wheezes. She has no rales. She exhibits no tenderness.  Abdominal: Soft. Bowel sounds are normal. She exhibits no distension and no mass. There is no tenderness. There is no rebound and no guarding.  Musculoskeletal: Normal range of motion. She exhibits no edema or tenderness.  Lymphadenopathy:    She has no cervical adenopathy.  Neurological: She is alert and oriented to person,  place, and time. Gait normal.  Skin: Skin is warm and dry. No rash noted.  No erythema. There is pallor.  Psychiatric:  Patient appears very anxious.  Nursing note and vitals reviewed.   LABORATORY DATA:. Appointment on 12/31/2015  Component Date Value Ref Range Status  . WBC 12/31/2015 6.1  3.9 - 10.3 10e3/uL Final  . NEUT# 12/31/2015 1.6  1.5 - 6.5 10e3/uL Final  . HGB 12/31/2015 12.0  11.6 - 15.9 g/dL Final  . HCT 12/31/2015 35.6  34.8 - 46.6 % Final  . Platelets 12/31/2015 30* 145 - 400 10e3/uL Final  . MCV 12/31/2015 91.3  79.5 - 101.0 fL Final  . MCH 12/31/2015 30.8  25.1 - 34.0 pg Final  . MCHC 12/31/2015 33.7  31.5 - 36.0 g/dL Final  . RBC 12/31/2015 3.90  3.70 - 5.45 10e6/uL Final  . RDW 12/31/2015 19.8* 11.2 - 14.5 % Final  . lymph# 12/31/2015 2.9  0.9 - 3.3 10e3/uL Final  . MONO# 12/31/2015 1.5* 0.1 - 0.9 10e3/uL Final  . Eosinophils Absolute 12/31/2015 0.0  0.0 - 0.5 10e3/uL Final  . Basophils Absolute 12/31/2015 0.1  0.0 - 0.1 10e3/uL Final  . NEUT% 12/31/2015 26.6* 38.4 - 76.8 % Final  . LYMPH% 12/31/2015 47.8  14.0 - 49.7 % Final  . MONO% 12/31/2015 24.6* 0.0 - 14.0 % Final  . EOS% 12/31/2015 0.0  0.0 - 7.0 % Final  . BASO% 12/31/2015 1.0  0.0 - 2.0 % Final  . nRBC 12/31/2015 6* 0 - 0 % Final  . Sodium 12/31/2015 134* 136 - 145 mEq/L Final  . Potassium 12/31/2015 4.1  3.5 - 5.1 mEq/L Final  . Chloride 12/31/2015 101  98 - 109 mEq/L Final  . CO2 12/31/2015 23  22 - 29 mEq/L Final  . Glucose 12/31/2015 144* 70 - 140 mg/dl Final  . BUN 12/31/2015 18.4  7.0 - 26.0 mg/dL Final  . Creatinine 12/31/2015 0.8  0.6 - 1.1 mg/dL Final  . Total Bilirubin 12/31/2015 0.86  0.20 - 1.20 mg/dL Final  . Alkaline Phosphatase 12/31/2015 337* 40 - 150 U/L Final  . AST 12/31/2015 48* 5 - 34 U/L Final  . ALT 12/31/2015 23  0 - 55 U/L Final  . Total Protein 12/31/2015 7.0  6.4 - 8.3 g/dL Final  . Albumin 12/31/2015 2.6* 3.5 - 5.0 g/dL Final  . Calcium 12/31/2015 9.7  8.4 - 10.4 mg/dL  Final  . Anion Gap 12/31/2015 10  3 - 11 mEq/L Final  . EGFR 12/31/2015 69* >90 ml/min/1.73 m2 Final  . Technologist Review 12/31/2015 Variant lymphs present, 12% Nrbcs   Final    RADIOGRAPHIC STUDIES: No results found.  ASSESSMENT/PLAN:    Unintentional weight loss Patient states that she continues to drink fluids; and has minimal appetite and continues to lose weight.  She has lost 7 pounds since her last weight check.  She was encouraged to eat multiple small meals throughout the day; and to push protein as well.  Thoracic back pain Patient states that she has a history of chronic back pain.  She is complaining of back pain and sacral pain from sitting in the chair during the exam.  Patient was given morphine 2 mg IV while at the [facility] receiving hydration.  Neoplastic (malignant) related fatigue Both patient and her husband state that patient has become increasingly fatigued and weak recently.  Patient states that she spends a good deal of time sitting or in her bed.  She has minimal tolerance ability of any physical activity at home whatsoever.  Patient's husband stated that they  have a home health physical therapist to come into the home twice per week and a sister.  Patient and walking about the house.  This provider as the patient if she does her exercises or walks about the house.  On the other days of the week-and patient stated that she has not done so in the last few weeks.  She confirms she does have a walker at home; but she has not been using it.  Patient's husband states that patient becomes extremely fatigued, slightly winded, and "in a tizzy" when she becomes frustrated or tired.  Most likely, patient is suffering from chronic issues with fatigue and a subsequent deconditioning.  Recommended that patient increase her level of activity and walk about the house is much as possible to gain strength.  Hypoalbuminemia due to protein-calorie malnutrition (HCC) Albumin has  decreased from 2.8 down to 2.6.  Patient was encouraged to push protein in her diet as much as possible.  Hyperphosphatemia Alkaline phosphatase is increased from 293 up to 337 today.  Will review all labs with Dr. Bertis Ruddy and continue to monitor.  Hodgkin lymphoma (HCC) Patient received her last IVIG infusion on 12/20/2015.  She is scheduled to return on 01/16/2016 for a pet scan.  She is scheduled for labs, flush, visit, and her next IVIG on 01/17/2016.  Fever in adult Patient states that she has had chronic fevers to maximum of 102.8 intermittently since prior to her diagnosis.  She also has a history of chronic sinusitis; and has been on chronic antibiotics as well.  She has since developed C. difficile infection recently; and was just discharged from the hospital, approximate 2 weeks ago.  She continues with her nightly fevers as well; and states that Tylenol helps relieve the fevers.  She states that she has one vancomycin oral tablet (take today.  She states that her last diarrhea was either Friday or Saturday of last week.  Patient denies any URI symptoms at the present time.  She denies any UTI symptoms as well.  She denies any cough.  On initial exam today.  Patient's stricture was 98.8; the patient was complaining of being significantly chilled while in the exam room.  She appeared very anxious and was almost hyperventilating.  She was complaining of being cold and having back pain.  She was advised to try a calming her breathing; and was also given several warm blankets as well.  She was given Tylenol 650 mg as well.  Reviewed all findings with Dr. Bertis Ruddy; and Dr. Bertis Ruddy recommended that patient finish all of her vancomycin; and then to hold on any further antibiotics secondary to the issues with C. difficile in the past.  Also, she recommended that patient receive IV fluid rehydration and some pain medication.  Patient will be given morphine 2 mg IV in addition to IV fluid  rehydration.  The plan is for the patient to receive her IV fluids today; and then to reassess.  Patient does not appear in acute distress today. ___________________________________  Update: Patient's fever increased to 99.8 as max today at the cancer Center today.  Patient received her IV fluids; and refused pain medication.  She appeared to feel much more comfortable and less anxious after receiving her IV fluid.  Epistaxis Patient states that she has been having a mild nosebleed almost everyday.  She admitted that she has been blowing her nose to get the clots out daily as well.  Long discussion with both patient and her husband regarding  the need to avoid blowing her nose or at least blowing her nose gently.  When she has low platelets.  Platelet count today has decreased down to 30.  Patient does not have any nosebleed at this current time.  Instructed the patient in regards to how to stop a nosebleed in the future.  She should hold pressure consistently for at least 10 minutes to her knee errors in the future for any nosebleeds.  If the nosebleed continues or is copious-patient should go directly to the emergency department for further evaluation and management.  Dehydration Patient's sodium is down to 134 today.  Patient does appear mildly dehydrated; even though she is drinking only water during the exam.  Patient will receive IV fluid rehydration while at the [facility] today.  C. difficile colitis Patient has been on chronic antibiotics recently; and has been diagnosed with C. difficile.  She finishes her last drink myosin oral tablet today for treatment of her C. difficile.  She was recommended to hold any further antibiotics for the time being.  Per Dr. Alvy Bimler.  Patient states that her last diarrhea was this past weekend on Friday or Saturday.  Anxiety state Patient has a history of anxiety; and admits to feeling anxious.  Patient's husband states the patient becomes increasingly  anxious when she is tired, cold, or "things don't go as planned".  Patient was given pain medication and warm blankets today in the exam room; and will continue to monitor. _____________________________________  Update: After patient received IV fluid rehydration-patient appeared to feel much better and was much less anxious.  Also, patient refused to receive any pain medication while at the [facility] today.  Long discussion with both patient and her husband regarding patient's anxiety at times.  Patient states that it only happens occasionally; but patient's husband states the patient becomes anxious quite often and is interested and trialing an anti--anxiety agent.  Patient will be prescribed Ativan 0.5 mg twice a day.  And.  Patient was advised that the Ativan may cause some sleepiness; so patient should take precautions to avoid any falls.  Confirmed the patient is not taking any other narcotics at this present time.  Patient was given only a small amount of the Ativan to trial only.  A shunt was advised to call/return to go directly to the emergency department for any worsening symptoms whatsoever.     Patient stated understanding of all instructions; and was in agreement with this plan of care. The patient knows to call the clinic with any problems, questions or concerns.   Total time spent with patient was 40 minutes;  with greater than 75 percent of that time spent in face to face counseling regarding patient's symptoms,  and coordination of care and follow up.  Disclaimer:This dictation was prepared with Dragon/digital dictation along with [employer]. Any transcriptional errors that result from this process are unintentional.  Drue Second, NP 12/31/2015

## 2015-12-31 NOTE — Assessment & Plan Note (Addendum)
Patient has a history of anxiety; and admits to feeling anxious.  Patient's husband states the patient becomes increasingly anxious when she is tired, cold, or "things don't go as planned".  Patient was given pain medication and warm blankets today in the exam room; and will continue to monitor. _____________________________________  Update: After patient received IV fluid rehydration-patient appeared to feel much better and was much less anxious.  Also, patient refused to receive any pain medication while at the [facility] today.  Long discussion with both patient and her husband regarding patient's anxiety at times.  Patient states that it only happens occasionally; but patient's husband states the patient becomes anxious quite often and is interested and trialing an anti--anxiety agent.  Patient will be prescribed Ativan 0.5 mg twice a day.  And.  Patient was advised that the Ativan may cause some sleepiness; so patient should take precautions to avoid any falls.  Confirmed the patient is not taking any other narcotics at this present time.  Patient was given only a small amount of the Ativan to trial only.  A shunt was advised to call/return to go directly to the emergency department for any worsening symptoms whatsoever.

## 2015-12-31 NOTE — Assessment & Plan Note (Signed)
Patient states that she has been having a mild nosebleed almost everyday.  She admitted that she has been blowing her nose to get the clots out daily as well.  Long discussion with both patient and her husband regarding the need to avoid blowing her nose or at least blowing her nose gently.  When she has low platelets.  Platelet count today has decreased down to 30.  Patient does not have any nosebleed at this current time.  Instructed the patient in regards to how to stop a nosebleed in the future.  She should hold pressure consistently for at least 10 minutes to her knee errors in the future for any nosebleeds.  If the nosebleed continues or is copious-patient should go directly to the emergency department for further evaluation and management.

## 2015-12-31 NOTE — Assessment & Plan Note (Signed)
Patient has been on chronic antibiotics recently; and has been diagnosed with C. difficile.  She finishes her last drink myosin oral tablet today for treatment of her C. difficile.  She was recommended to hold any further antibiotics for the time being.  Per Dr. Alvy Bimler.  Patient states that her last diarrhea was this past weekend on Friday or Saturday.

## 2015-12-31 NOTE — Assessment & Plan Note (Signed)
Patient states that she has a history of chronic back pain.  She is complaining of back pain and sacral pain from sitting in the chair during the exam.  Patient was given morphine 2 mg IV while at the [facility] receiving hydration.

## 2016-01-01 ENCOUNTER — Emergency Department (HOSPITAL_COMMUNITY)
Admission: EM | Admit: 2016-01-01 | Discharge: 2016-01-01 | Disposition: A | Discharge status: Home / Self Care | Payer: Medicare Other | Attending: Emergency Medicine | Admitting: Emergency Medicine

## 2016-01-01 ENCOUNTER — Telehealth: Payer: Self-pay | Admitting: *Deleted

## 2016-01-01 ENCOUNTER — Emergency Department (HOSPITAL_COMMUNITY): Payer: Medicare Other

## 2016-01-01 ENCOUNTER — Encounter (HOSPITAL_COMMUNITY): Payer: Self-pay | Admitting: Emergency Medicine

## 2016-01-01 DIAGNOSIS — J320 Chronic maxillary sinusitis: Secondary | ICD-10-CM | POA: Diagnosis not present

## 2016-01-01 DIAGNOSIS — I5022 Chronic systolic (congestive) heart failure: Secondary | ICD-10-CM | POA: Diagnosis not present

## 2016-01-01 DIAGNOSIS — J45909 Unspecified asthma, uncomplicated: Secondary | ICD-10-CM | POA: Diagnosis not present

## 2016-01-01 DIAGNOSIS — N179 Acute kidney failure, unspecified: Secondary | ICD-10-CM | POA: Diagnosis present

## 2016-01-01 DIAGNOSIS — Z85828 Personal history of other malignant neoplasm of skin: Secondary | ICD-10-CM | POA: Insufficient documentation

## 2016-01-01 DIAGNOSIS — Z79899 Other long term (current) drug therapy: Secondary | ICD-10-CM | POA: Insufficient documentation

## 2016-01-01 DIAGNOSIS — T451X5S Adverse effect of antineoplastic and immunosuppressive drugs, sequela: Secondary | ICD-10-CM | POA: Diagnosis not present

## 2016-01-01 DIAGNOSIS — I110 Hypertensive heart disease with heart failure: Secondary | ICD-10-CM | POA: Diagnosis not present

## 2016-01-01 DIAGNOSIS — E039 Hypothyroidism, unspecified: Secondary | ICD-10-CM | POA: Insufficient documentation

## 2016-01-01 DIAGNOSIS — R531 Weakness: Secondary | ICD-10-CM | POA: Diagnosis not present

## 2016-01-01 DIAGNOSIS — R509 Fever, unspecified: Secondary | ICD-10-CM

## 2016-01-01 DIAGNOSIS — Z85038 Personal history of other malignant neoplasm of large intestine: Secondary | ICD-10-CM | POA: Diagnosis not present

## 2016-01-01 DIAGNOSIS — A0472 Enterocolitis due to Clostridium difficile, not specified as recurrent: Secondary | ICD-10-CM | POA: Diagnosis not present

## 2016-01-01 DIAGNOSIS — G620 Drug-induced polyneuropathy: Secondary | ICD-10-CM | POA: Diagnosis not present

## 2016-01-01 LAB — I-STAT CG4 LACTIC ACID, ED
Lactic Acid, Venous: 3.23 mmol/L (ref 0.5–1.9)
Lactic Acid, Venous: 1.93 mmol/L (ref 0.5–1.9)

## 2016-01-01 LAB — COMPREHENSIVE METABOLIC PANEL
ALT: 24 U/L (ref 14–54)
AST: 59 U/L — ABNORMAL HIGH (ref 15–41)
Albumin: 3.1 g/dL — ABNORMAL LOW (ref 3.5–5.0)
Alkaline Phosphatase: 273 U/L — ABNORMAL HIGH (ref 38–126)
Anion gap: 9 (ref 5–15)
BUN: 16 mg/dL (ref 6–20)
CO2: 23 mmol/L (ref 22–32)
Calcium: 8.8 mg/dL — ABNORMAL LOW (ref 8.9–10.3)
Chloride: 100 mmol/L — ABNORMAL LOW (ref 101–111)
Creatinine, Ser: 0.88 mg/dL (ref 0.44–1.00)
GFR calc Af Amer: >60 mL/min (ref >60)
GFR calc non Af Amer: >60 mL/min (ref >60)
Glucose, Bld: 97 mg/dL (ref 65–99)
Potassium: 3.8 mmol/L (ref 3.5–5.1)
Sodium: 132 mmol/L — ABNORMAL LOW (ref 135–145)
Total Bilirubin: 1 mg/dL (ref 0.3–1.2)
Total Protein: 6.8 g/dL (ref 6.5–8.1)

## 2016-01-01 LAB — CBC WITH DIFFERENTIAL/PLATELET
Basophils Absolute: 0.1 10*3/uL (ref 0.0–0.1)
Basophils Relative: 1 %
Eosinophils Absolute: 0 10*3/uL (ref 0.0–0.7)
Eosinophils Relative: 0 %
HCT: 33.9 % — ABNORMAL LOW (ref 36.0–46.0)
Hemoglobin: 11.4 g/dL — ABNORMAL LOW (ref 12.0–15.0)
Lymphocytes Relative: 49 %
Lymphs Abs: 3.9 10*3/uL (ref 0.7–4.0)
MCH: 30.4 pg (ref 26.0–34.0)
MCHC: 33.6 g/dL (ref 30.0–36.0)
MCV: 90.4 fL (ref 78.0–100.0)
Monocytes Absolute: 2.2 10*3/uL — ABNORMAL HIGH (ref 0.1–1.0)
Monocytes Relative: 27 %
Neutro Abs: 1.8 10*3/uL (ref 1.7–7.7)
Neutrophils Relative %: 23 %
Platelets: 29 10*3/uL — CL (ref 150–400)
RBC: 3.75 MIL/uL — ABNORMAL LOW (ref 3.87–5.11)
RDW: 19.8 % — ABNORMAL HIGH (ref 11.5–15.5)
WBC: 8 10*3/uL (ref 4.0–10.5)

## 2016-01-01 LAB — URINALYSIS, ROUTINE W REFLEX MICROSCOPIC
Bilirubin Urine: NEGATIVE
Glucose, UA: NEGATIVE mg/dL
Hgb urine dipstick: NEGATIVE
Ketones, ur: NEGATIVE mg/dL
Nitrite: NEGATIVE
Protein, ur: 30 mg/dL — AB
Specific Gravity, Urine: 1.021 (ref 1.005–1.030)
pH: 6 (ref 5.0–8.0)

## 2016-01-01 LAB — URINE MICROSCOPIC-ADD ON: RBC / HPF: NONE SEEN RBC/hpf (ref 0–5)

## 2016-01-01 MED ORDER — SODIUM CHLORIDE 0.9 % IV BOLUS (SEPSIS)
1000.0000 mL | Freq: Once | INTRAVENOUS | Status: AC
  Administered 2016-01-01: 1000 mL via INTRAVENOUS

## 2016-01-01 MED ORDER — ACETAMINOPHEN 500 MG PO TABS
1000.0000 mg | ORAL_TABLET | Freq: Once | ORAL | Status: AC
  Administered 2016-01-01: 1000 mg via ORAL
  Filled 2016-01-01: qty 2

## 2016-01-01 MED ORDER — VANCOMYCIN 50 MG/ML ORAL SOLUTION: 125.0000 mg | Freq: Four times a day (QID) | ORAL | Status: DC

## 2016-01-01 NOTE — ED Notes (Signed)
Abnormal lab result MD kohut have been made aware

## 2016-01-01 NOTE — Telephone Encounter (Signed)
Called patient and instructed her to go the ED per Dr Alvy Bimler. Verbalized understanding

## 2016-01-01 NOTE — ED Provider Notes (Signed)
[facility] DEPT Provider Note   CSN: [id number] Arrival date & time: 01/01/16  1605 By signing my name below, I, Georgette Shell, attest that this documentation has been prepared under the direction and in the presence of Virgel Manifold, MD. Electronically Signed: Georgette Shell, ED Scribe. 01/01/16. 5:30 PM.  History   Chief Complaint Chief Complaint  Patient presents with  . Fever   HPI Comments: Diamond Boyd is a 75 y.o. female with h/o skin cancer, colon cancer, CHF, and HTN who presents to the Emergency Department complaining of intermittent fever onset 4 months ago with associated chills, decreased appetite, and cough. Pt reports that for the past couple of months, she has been running fevers ranging from 99 to 102. She notes that she has lost a great amount of weight recently, stating that her regular weight is at 347 lbs and it is now at 228 lbs. Pt was told by her doctor at the [facility] to come to the ED for further evaluation. Pt has taken Tylenol with no relief, she is unsure of the last time she has taken it. Pt was taking Augmentin prior to the onset of her symptoms to treat a sinus infection. Pt is not on chemotherapy at this time. Pt denies vomiting, shortness of breath, dysuria, or any other associated symptoms.   The history is provided by the patient. No language interpreter was used.    Past Medical History:  Diagnosis Date  . Anemia    hx blood tx  . Benign essential HTN   . Cancer ([place])    skin cancer- basal cell on arm / colon 2011  . Chronic systolic CHF (congestive heart failure), NYHA class 2 ([place]) 01/2015  . Colon cancer ([place])    2011, s/p surgery, in remission  . Colon polyps   . Degenerative disk disease    Thoracic spine  . Depression   . 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 Generalized weakness 05/11/2015  . GERD (gastroesophageal reflux disease)   .  Heart murmur   . hodgkins lymphoma   . Hypothyroidism   . Lymphoma ([place])   . 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 NICM (nonischemic cardiomyopathy) ([place]) 01/2015  . Osteoarthritis    hands  . 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 PAF (paroxysmal atrial fibrillation) ([place])   . Peripheral vascular disease ([place])   . Pneumonia     Patient Active Problem List   Diagnosis Date Noted  . Dehydration 12/31/2015  . Neoplastic (malignant) related fatigue 12/31/2015  . Unintentional weight loss 12/31/2015  . Epistaxis 12/31/2015  . Hypoalbuminemia due to protein-calorie malnutrition ([place]) 12/31/2015  . C. difficile colitis   . Colitis 12/11/2015  . Basal cell carcinoma 10/31/2015  . Neuropathy ([place]) 10/31/2015  . S/P cataract surgery 10/31/2015  . Chronic rhinitis 10/31/2015  . Moderate persistent asthma 10/31/2015  . Physical deconditioning 10/16/2015  . Chronic ethmoidal sinusitis 09/18/2015  . 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 Cough 07/18/2015  . Port catheter in place 07/11/2015  . Generalized weakness 05/11/2015  . Benign essential HTN   . PAF (paroxysmal atrial fibrillation) ([place])   . Internal hemorrhoids 02/21/2015  . Rectal bleeding 02/06/2015  . 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 Thrombocytopenia ([place]) 01/29/2015  . AKI (acute kidney injury) ([place])   . Elevated troponin   . Atrial fibrillation with rapid ventricular response ([place]) 01/23/2015  .  New onset a-fib ([place]) 01/23/2015  . Hypertension 01/23/2015  . CHF (congestive heart failure) ([place]) 01/21/2015  . Chronic systolic CHF (congestive heart failure), NYHA class 2 ([place]) 01/11/2015  . Fever in adult 01/10/2015  . Immunocompromised status associated with infection ([place]) 01/08/2015  . 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 Preventive measure  11/14/2014  . Parotid mass 11/14/2014  . Chronic lower back pain 09/28/2014  . Mediastinal lymphadenopathy 09/27/2014  . 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 Peripheral neuropathy due to chemotherapy ([place]) 04/03/2014  . Trochanteric bursitis of right hip 04/03/2014  . Anemia in chronic illness 02/21/2014  . Elevated liver enzymes 02/21/2014  . Urinary tract infection 11/18/2013  . Candidal intertrigo 09/23/2013  . Atrophic vaginitis 09/23/2013  . Drug-induced neutropenia ([place]) 09/01/2013  . History of colon cancer 06/17/2013  . History of Hodgkin's lymphoma 05/20/2013  . Conjunctivitis, acute 04/27/2013  . Cold sore 04/27/2013  . Hyperglycemia 03/11/2013  . Hodgkin lymphoma ([place]) 02/10/2013  . Acute maxillary sinusitis 02/09/2013  . Other malaise and fatigue 01/04/2013  . Shingles 11/22/2012  . Cystocele 09/22/2012  . Encounter for Medicare annual wellness exam 09/17/2012  . Left knee pain 09/10/2012  . Thoracic back pain 06/01/2012  . Skin lesion of face 05/17/2012  . Other screening mammogram 07/22/2011  . Routine gynecological examination 07/22/2011  . Colon cancer screening 07/22/2011  . History of anemia 05/06/2011  . 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 Hypothyroid 02/24/2011  . Varicose veins 02/24/2011  . Obesity 11/20/2010  . Colon cancer ([place]) 08/23/2009  . Hyperphosphatemia 08/09/2009  . Chronic maxillary sinusitis 07/03/2009  . THROAT PAIN, CHRONIC 07/03/2009  . S/P colon resection 02/10/2009  . GERD 12/20/2008  . Anxiety state 08/22/2008  . MENOPAUSAL SYNDROME 07/13/2007  . High cholesterol 07/08/2006  . Allergic rhinitis 07/08/2006  . OVERACTIVE BLADDER 07/08/2006  . 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 HYPOTHYROIDISM 06/25/2006  . Depression 06/25/2006  . PERIPHERAL VASCULAR DISEASE 06/25/2006  . OSTEOARTHRITIS 06/25/2006  . LEG EDEMA 06/25/2006  . SKIN CANCER, HX OF 06/25/2006  . COLONIC POLYPS, HX OF 06/25/2006    Past  Surgical History:  Procedure Laterality Date  . BONE MARROW BIOPSY  05/27/13  . BREAST BIOPSY  1996  . CARDIAC CATHETERIZATION N/A 01/25/2015   Procedure: Left Heart Cath and Coronary Angiography;  Surgeon: Peter M Martinique, MD;  Location: [facility];  Service: Cardiovascular;  Laterality: N/A;  . CATARACT EXTRACTION, BILATERAL  2016  . CHOLECYSTECTOMY    . COLECTOMY  8/11  . COLONOSCOPY W/ BIOPSIES    . DG BIOPSY LUNG    . LYMPH NODE BIOPSY N/A 11/01/2014   Procedure: MEDIASTINAL LYMPH NODE BIOPSY;  Surgeon: Grace Isaac, MD;  Location: [facility];  Service: Thoracic;  Laterality: N/A;  . MEDIASTINOSCOPY N/A 11/01/2014   Procedure: MEDIASTINOSCOPY;  Surgeon: Grace Isaac, MD;  Location: [facility];  Service: Thoracic;  Laterality: N/A;  . PLANTAR FASCIA RELEASE    . port-a-cath insertion    . SPLENECTOMY     lymphoma  . TENDON RELEASE     Right thumb   . VAGINAL HYSTERECTOMY    . VENTRAL HERNIA REPAIR  1998  . VIDEO BRONCHOSCOPY WITH ENDOBRONCHIAL ULTRASOUND N/A 10/17/2014   Procedure: VIDEO BRONCHOSCOPY WITH ENDOBRONCHIAL ULTRASOUND;  Surgeon: Javier Glazier, MD;  Location: [facility];  Service: Thoracic;  Laterality: N/A;  . VIDEO BRONCHOSCOPY WITH ENDOBRONCHIAL ULTRASOUND N/A 11/01/2014   Procedure: VIDEO BRONCHOSCOPY WITH ENDOBRONCHIAL ULTRASOUND;  Surgeon: Grace Isaac, MD;  Location: [facility];  Service: Thoracic;  Laterality: N/A;    OB History    No data available       Home Medications    Prior to Admission medications   Medication Sig Start Date End Date Taking? Authorizing Provider  acetaminophen (TYLENOL) 650 MG CR tablet Take 650 mg by mouth every 8 (eight) hours as needed for pain or fever.     Historical Provider, MD  Acidophilus Lactobacillus CAPS Take 1 capsule by mouth 3 (three) times daily. 12/16/15   Doreatha Lew, MD  acyclovir (ZOVIRAX) 400 MG tablet Take 1 tablet (400 mg total) by mouth daily. 10/29/15   Abner Greenspan, MD  Albuterol Sulfate (PROAIR  RESPICLICK) 992 (90 Base) MCG/ACT AEPB Inhale 1-2 puffs into the lungs every 6 (six) hours as needed (SOB, wheezing).     Historical Provider, MD  Calcium Carbonate-Vitamin D (CALCIUM 600+D) 600-400 MG-UNIT per tablet Take 1 tablet by mouth every evening.     Historical Provider, MD  celecoxib (CELEBREX) 200 MG capsule Take 1 capsule (200 mg total) by mouth daily as needed for moderate pain. Patient not taking: Reported on 12/31/2015 10/29/15   Abner Greenspan, MD  Cholecalciferol (VITAMIN D-3) 1000 UNITS CAPS Take 1,000 Units by mouth daily.    Historical Provider, MD  docusate sodium (COLACE) 100 MG capsule Take 100 mg by mouth at bedtime.    Historical Provider, MD  estradiol (ESTRACE) 1 MG tablet Take 1 tablet (1 mg total) by mouth daily. 10/29/15   Abner Greenspan, MD  fexofenadine (ALLEGRA) 180 MG tablet TAKE 1 TABLET (180 MG TOTAL) BY MOUTH DAILY. (*OTC) 08/01/15   Historical Provider, MD  FLUoxetine (PROZAC) 20 MG capsule Take 1 capsule (20 mg total) by mouth daily. 10/29/15   Abner Greenspan, MD  fluticasone (FLONASE) 50 MCG/ACT nasal spray Place 2 sprays into both nostrils daily. Patient not taking: Reported on 12/31/2015 10/29/15   Abner Greenspan, MD  furosemide (LASIX) 20 MG tablet Take 1 tablet (20 mg total) by mouth daily. Take 1 tab by mouth daily for weight gain. 10/29/15   Abner Greenspan, MD  gabapentin (NEURONTIN) 300 MG capsule Take 1 capsule (300 mg total) by mouth 2 (two) times daily. 10/29/15   Abner Greenspan, MD  levothyroxine (SYNTHROID, LEVOTHROID) 112 MCG tablet Take 1 tablet (112 mcg total) by mouth daily before breakfast. 10/29/15   Abner Greenspan, MD  lidocaine (XYLOCAINE) 5 % ointment Apply 1 application topically 3 (three) times daily as needed. To rectal area 12/11/15   Abner Greenspan, MD  lidocaine-prilocaine (EMLA) cream Apply 1 application topically as needed (For port-a-cath.). Reported on 02/22/2015    Historical Provider, MD  LORazepam (ATIVAN) 0.5 MG tablet Take 1 tablet (0.5 mg  total) by mouth every 12 (twelve) hours as needed for anxiety. 12/31/15   Susanne Borders, NP  losartan (COZAAR) 25 MG tablet Take 1 tablet (25 mg total) by mouth daily. 10/29/15   Abner Greenspan, MD  metoprolol (LOPRESSOR) 50 MG tablet TAKE 1 TABLET (50 MG TOTAL) BY MOUTH 2 (TWO) TIMES DAILY. 10/29/15   Abner Greenspan, MD  Omega-3 350 MG CAPS Take 1 capsule by mouth daily. Reported on 02/22/2015    Historical Provider, MD  ondansetron (ZOFRAN) 8 MG tablet Take 8  mg by mouth every 6 (six) hours as needed for nausea or vomiting. Reported on 03/05/2015 12/25/14   Historical Provider, MD  polyethylene glycol (MIRALAX / GLYCOLAX) packet Take 17 g by mouth at bedtime.    Historical Provider, MD  prochlorperazine (COMPAZINE) 10 MG tablet Take 10 mg by mouth every 6 (six) hours as needed for nausea or vomiting. Reported on 03/05/2015 12/25/14   Historical Provider, MD  psyllium (METAMUCIL) 58.6 % packet Take 1-3 packets by mouth at bedtime.     Historical Provider, MD  simvastatin (ZOCOR) 40 MG tablet Take 1 tablet (40 mg total) by mouth at bedtime. 10/29/15   Abner Greenspan, MD    Family History Family History  Problem Relation Age of Onset  . Coronary artery disease Mother   . Diabetes Mother   . Fibromyalgia Daughter     chronic pain   . COPD Daughter   . Diabetes Brother   . Asthma Daughter   . Rheum arthritis Brother   . Clotting disorder Brother   . Alcohol abuse Sister   . Colon cancer Neg Hx   . Colon polyps Neg Hx   . Stomach cancer Neg Hx   . Rectal cancer Neg Hx   . Ulcerative colitis Neg Hx   . Crohn's disease Neg Hx     Social History Social History  Substance Use Topics  . Smoking status: Never Smoker  . Smokeless tobacco: Never Used     Comment: Second-hand exposure through father.  . Alcohol use No     Allergies   Achromycin [tetracycline hcl]; Allopurinol; Astelin [azelastine hcl]; Cephalexin; Codeine; Lisinopril; Meloxicam; Minocycline; Nabumetone; Nyquil  [pseudoeph-doxylamine-dm-apap]; Sulfa antibiotics; Zolpidem tartrate; Buspar [buspirone hcl]; and Ciprofloxacin   Review of Systems Review of Systems  Constitutional: Positive for appetite change, chills, fever and unexpected weight change.  Respiratory: Positive for cough. Negative for shortness of breath.   Gastrointestinal: Negative for vomiting.  Genitourinary: Negative for dysuria.  Musculoskeletal: Negative for arthralgias and myalgias.  All other systems reviewed and are negative.    Physical Exam Updated Vital Signs BP 115/97 (BP Location: Right Arm)   Pulse 85   Temp 98.9 F (37.2 C) (Oral)   Resp 18   LMP 02/10/1970   SpO2 100%   Physical Exam  Constitutional: She is oriented to person, place, and time. She appears well-developed and well-nourished.  Non-toxic appearance. No distress.  Appears tired but non-toxic.  HENT:  Head: Normocephalic and atraumatic.  Nose: Nose normal.  Mouth/Throat: Oropharynx is clear and moist. No oropharyngeal exudate.  Eyes: Conjunctivae and EOM are normal. Pupils are equal, round, and reactive to light. No scleral icterus.  Neck: Normal range of motion. Neck supple. No JVD present. No tracheal deviation present. No thyromegaly present.  Cardiovascular: Normal rate, regular rhythm and normal heart sounds.  Exam reveals no gallop and no friction rub.   No murmur heard. Pulmonary/Chest: Effort normal and breath sounds normal. No respiratory distress. She has no wheezes. She exhibits no tenderness.  Abdominal: Soft. Bowel sounds are normal. She exhibits no distension and no mass. There is no tenderness. There is no rebound and no guarding.  Musculoskeletal: Normal range of motion. She exhibits no edema or tenderness.  Lymphadenopathy:    She has no cervical adenopathy.  Neurological: She is alert and oriented to person, place, and time. No cranial nerve deficit. She exhibits normal muscle tone.  Skin: Skin is warm and dry. No rash noted.  No erythema. No pallor.  Psychiatric: She has a normal mood and affect. Her behavior is normal.  Nursing note and vitals reviewed.    ED Treatments / Results  DIAGNOSTIC STUDIES: Oxygen Saturation is 100% on RA, normal by my interpretation.    COORDINATION OF CARE: 5:30 PM Discussed treatment plan with pt at bedside which includes lab work and pt agreed to plan.  Labs (all labs ordered are listed, but only abnormal results are displayed) Labs Reviewed  URINALYSIS, ROUTINE W REFLEX MICROSCOPIC (NOT AT [facility]) - Abnormal; Notable for the following:       Result Value   Color, Urine AMBER (*)    APPearance CLOUDY (*)    Protein, ur 30 (*)    Leukocytes, UA TRACE (*)    All other components within normal limits  COMPREHENSIVE METABOLIC PANEL - Abnormal; Notable for the following:    Sodium 132 (*)    Chloride 100 (*)    Calcium 8.8 (*)    Albumin 3.1 (*)    AST 59 (*)    Alkaline Phosphatase 273 (*)    All other components within normal limits  CBC WITH DIFFERENTIAL/PLATELET - Abnormal; Notable for the following:    RBC 3.75 (*)    Hemoglobin 11.4 (*)    HCT 33.9 (*)    RDW 19.8 (*)    Platelets 29 (*)    Monocytes Absolute 2.2 (*)    All other components within normal limits  URINE MICROSCOPIC-ADD ON - Abnormal; Notable for the following:    Squamous Epithelial / LPF 0-5 (*)    Bacteria, UA FEW (*)    Crystals CA OXALATE CRYSTALS (*)    All other components within normal limits  I-STAT CG4 LACTIC ACID, ED - Abnormal; Notable for the following:    Lactic Acid, Venous 3.23 (*)    All other components within normal limits  I-STAT CG4 LACTIC ACID, ED - Abnormal; Notable for the following:    Lactic Acid, Venous 1.93 (*)    All other components within normal limits  CULTURE, BLOOD (ROUTINE X 2)  CULTURE, BLOOD (ROUTINE X 2)    EKG  EKG Interpretation None       Radiology No results found.  Procedures Procedures (including critical care time)  Medications  Ordered in ED Medications - No data to display   Initial Impression / Assessment and Plan / ED Course  I have reviewed the triage vital signs and the nursing notes.  Pertinent labs & imaging results that were available during my care of the patient were reviewed by me and considered in my medical decision making (see chart for details).  Clinical Course     75yF with intermittent fever. Has been having them for weeks to months. No clear source on w/u today. May be cancer related. Thrombocytopenia chronic. No overt bleeding.   Final Clinical Impressions(s) / ED Diagnoses   Final diagnoses:  Fever, unspecified fever cause    New Prescriptions New Prescriptions   No medications on file   I personally preformed the services scribed in my presence. The recorded information has been reviewed is accurate. Virgel Manifold, MD.     Virgel Manifold, MD 01/10/16 [phone]

## 2016-01-01 NOTE — Telephone Encounter (Signed)
Patient's husband called to report that patient had temp of 102 yesterday afternoon when they got home from the [facility].  She has no appetite.  He is trying to get her to drink fluids, he thinks she is drinking ok but really has no idea of how much po fluids she is drinking.  Asked him to start keeping track of that each day.  Her temp currently is 101.3 '@1323'$ .  She took tylenol about 9:30am this morning.  Her legs continue to hurt due to neuropathy.   Discussed with Drucie Ip RN for Selena Lesser NP who saw patient yesterday.   She will discuss with Cyndee and see what course needs to be taken.

## 2016-01-01 NOTE — ED Triage Notes (Signed)
Patient states that she was told by her MD at [facility] to come to ED for further evaluation on fevers x 4 months.  Patient has been running fevers from 99-102. Patient unsure of when last time had tylenol

## 2016-01-01 NOTE — ED Notes (Signed)
Patient c/o fever and weakness for several weeks.  She denies pain, N/V/D, SOB, dizziness and chest pain.

## 2016-01-04 ENCOUNTER — Telehealth: Payer: Self-pay | Admitting: *Deleted

## 2016-01-04 NOTE — Telephone Encounter (Signed)
Received vm call from pt's husband, Elenore Rota stating that wife is still having diarrhea & elevated temp.  Call returned & he states that temp is 101.6 yest & today.  She has had 2 diarrhea BM's today & 5 yesterday.  He reports no appetite but no n/v.  She has had some blood from her nose.  No other c/o's.  Questioned about port & urine & he states no symptoms.  Discussed with Selena Lesser NP & she suggested pt go to ED.  She thinks that her fever could be tumor fever but should be evaluated for further causes.  Informed Elenore Rota to take to ED & he expressed understanding.

## 2016-01-06 ENCOUNTER — Inpatient Hospital Stay
Admission: EM | Admit: 2016-01-06 | Discharge: 2016-01-13 | DRG: 813 | Disposition: A | Discharge status: Home Health | Payer: Medicare Other | Attending: Internal Medicine | Admitting: Internal Medicine

## 2016-01-06 ENCOUNTER — Encounter: Payer: Self-pay | Admitting: Emergency Medicine

## 2016-01-06 DIAGNOSIS — D801 Nonfamilial hypogammaglobulinemia: Secondary | ICD-10-CM | POA: Diagnosis present

## 2016-01-06 DIAGNOSIS — Z79899 Other long term (current) drug therapy: Secondary | ICD-10-CM

## 2016-01-06 DIAGNOSIS — F329 Major depressive disorder, single episode, unspecified: Secondary | ICD-10-CM | POA: Diagnosis present

## 2016-01-06 DIAGNOSIS — J320 Chronic maxillary sinusitis: Secondary | ICD-10-CM | POA: Diagnosis not present

## 2016-01-06 DIAGNOSIS — C8290 Follicular lymphoma, unspecified, unspecified site: Secondary | ICD-10-CM | POA: Diagnosis present

## 2016-01-06 DIAGNOSIS — Z9081 Acquired absence of spleen: Secondary | ICD-10-CM

## 2016-01-06 DIAGNOSIS — Z9841 Cataract extraction status, right eye: Secondary | ICD-10-CM

## 2016-01-06 DIAGNOSIS — Z7722 Contact with and (suspected) exposure to environmental tobacco smoke (acute) (chronic): Secondary | ICD-10-CM | POA: Diagnosis present

## 2016-01-06 DIAGNOSIS — D6959 Other secondary thrombocytopenia: Principal | ICD-10-CM | POA: Diagnosis present

## 2016-01-06 DIAGNOSIS — R011 Cardiac murmur, unspecified: Secondary | ICD-10-CM | POA: Diagnosis present

## 2016-01-06 DIAGNOSIS — Z7951 Long term (current) use of inhaled steroids: Secondary | ICD-10-CM

## 2016-01-06 DIAGNOSIS — R040 Epistaxis: Secondary | ICD-10-CM | POA: Diagnosis not present

## 2016-01-06 DIAGNOSIS — R262 Difficulty in walking, not elsewhere classified: Secondary | ICD-10-CM

## 2016-01-06 DIAGNOSIS — Z8249 Family history of ischemic heart disease and other diseases of the circulatory system: Secondary | ICD-10-CM

## 2016-01-06 DIAGNOSIS — I5022 Chronic systolic (congestive) heart failure: Secondary | ICD-10-CM | POA: Diagnosis present

## 2016-01-06 DIAGNOSIS — I429 Cardiomyopathy, unspecified: Secondary | ICD-10-CM | POA: Diagnosis present

## 2016-01-06 DIAGNOSIS — Z882 Allergy status to sulfonamides status: Secondary | ICD-10-CM

## 2016-01-06 DIAGNOSIS — A0471 Enterocolitis due to Clostridium difficile, recurrent: Secondary | ICD-10-CM | POA: Diagnosis present

## 2016-01-06 DIAGNOSIS — R52 Pain, unspecified: Secondary | ICD-10-CM

## 2016-01-06 DIAGNOSIS — E871 Hypo-osmolality and hyponatremia: Secondary | ICD-10-CM

## 2016-01-06 DIAGNOSIS — I739 Peripheral vascular disease, unspecified: Secondary | ICD-10-CM | POA: Diagnosis present

## 2016-01-06 DIAGNOSIS — E876 Hypokalemia: Secondary | ICD-10-CM | POA: Diagnosis present

## 2016-01-06 DIAGNOSIS — I480 Paroxysmal atrial fibrillation: Secondary | ICD-10-CM | POA: Diagnosis present

## 2016-01-06 DIAGNOSIS — D696 Thrombocytopenia, unspecified: Secondary | ICD-10-CM | POA: Diagnosis present

## 2016-01-06 DIAGNOSIS — K219 Gastro-esophageal reflux disease without esophagitis: Secondary | ICD-10-CM | POA: Diagnosis present

## 2016-01-06 DIAGNOSIS — Z9842 Cataract extraction status, left eye: Secondary | ICD-10-CM

## 2016-01-06 DIAGNOSIS — Z888 Allergy status to other drugs, medicaments and biological substances status: Secondary | ICD-10-CM

## 2016-01-06 DIAGNOSIS — Z881 Allergy status to other antibiotic agents status: Secondary | ICD-10-CM

## 2016-01-06 DIAGNOSIS — D649 Anemia, unspecified: Secondary | ICD-10-CM | POA: Diagnosis present

## 2016-01-06 DIAGNOSIS — Z9049 Acquired absence of other specified parts of digestive tract: Secondary | ICD-10-CM

## 2016-01-06 DIAGNOSIS — Z85038 Personal history of other malignant neoplasm of large intestine: Secondary | ICD-10-CM

## 2016-01-06 DIAGNOSIS — Z86010 Personal history of colonic polyps: Secondary | ICD-10-CM

## 2016-01-06 DIAGNOSIS — M6281 Muscle weakness (generalized): Secondary | ICD-10-CM

## 2016-01-06 DIAGNOSIS — Z85828 Personal history of other malignant neoplasm of skin: Secondary | ICD-10-CM

## 2016-01-06 DIAGNOSIS — E039 Hypothyroidism, unspecified: Secondary | ICD-10-CM | POA: Diagnosis present

## 2016-01-06 DIAGNOSIS — I110 Hypertensive heart disease with heart failure: Secondary | ICD-10-CM | POA: Diagnosis present

## 2016-01-06 DIAGNOSIS — K649 Unspecified hemorrhoids: Secondary | ICD-10-CM | POA: Diagnosis present

## 2016-01-06 DIAGNOSIS — Z885 Allergy status to narcotic agent status: Secondary | ICD-10-CM

## 2016-01-06 LAB — CBC WITH DIFFERENTIAL/PLATELET
BASOS ABS: 0 10*3/uL (ref 0–0.1)
BLASTS: 0 %
Band Neutrophils: 0 %
Basophils Relative: 0 %
Eosinophils Absolute: 0 10*3/uL (ref 0–0.7)
Eosinophils Relative: 0 %
HCT: 33.4 % — ABNORMAL LOW (ref 35.0–47.0)
HEMOGLOBIN: 11.1 g/dL — AB (ref 12.0–16.0)
Lymphocytes Relative: 55 %
Lymphs Abs: 6.3 10*3/uL — ABNORMAL HIGH (ref 1.0–3.6)
MCH: 31.3 pg (ref 26.0–34.0)
MCHC: 33.3 g/dL (ref 32.0–36.0)
MCV: 93.9 fL (ref 80.0–100.0)
METAMYELOCYTES PCT: 0 %
MONOS PCT: 16 %
Monocytes Absolute: 1.8 10*3/uL — ABNORMAL HIGH (ref 0.2–0.9)
Myelocytes: 0 %
NEUTROS ABS: 3.3 10*3/uL (ref 1.4–6.5)
Neutrophils Relative %: 29 %
Other: 0 %
Platelets: 17 10*3/uL — CL (ref 150–440)
Promyelocytes Absolute: 0 %
RBC: 3.56 MIL/uL — AB (ref 3.80–5.20)
RDW: 20.6 % — ABNORMAL HIGH (ref 11.5–14.5)
WBC: 11.4 10*3/uL — AB (ref 3.6–11.0)
nRBC: 9 /100 WBC — ABNORMAL HIGH

## 2016-01-06 LAB — CULTURE, BLOOD (ROUTINE X 2)
Culture: NO GROWTH
Culture: NO GROWTH

## 2016-01-06 LAB — BASIC METABOLIC PANEL
ANION GAP: 9 (ref 5–15)
BUN: 19 mg/dL (ref 6–20)
CHLORIDE: 100 mmol/L — AB (ref 101–111)
CO2: 20 mmol/L — ABNORMAL LOW (ref 22–32)
Calcium: 8.5 mg/dL — ABNORMAL LOW (ref 8.9–10.3)
Creatinine, Ser: 0.9 mg/dL (ref 0.44–1.00)
GFR calc non Af Amer: >60 mL/min (ref >60)
Glucose, Bld: 131 mg/dL — ABNORMAL HIGH (ref 65–99)
POTASSIUM: 3.7 mmol/L (ref 3.5–5.1)
SODIUM: 129 mmol/L — AB (ref 135–145)

## 2016-01-06 LAB — PROTIME-INR
INR: 1.18
PROTHROMBIN TIME: 15.1 s (ref 11.4–15.2)

## 2016-01-06 LAB — APTT: aPTT: 45 seconds — ABNORMAL HIGH (ref 24–36)

## 2016-01-06 MED ORDER — SODIUM CHLORIDE 0.9 % IV SOLN
Freq: Once | INTRAVENOUS | Status: AC
  Administered 2016-01-07: 04:00:00 via INTRAVENOUS

## 2016-01-06 NOTE — ED Provider Notes (Signed)
[facility] Emergency Department Provider Note   ____________________________________________   I have reviewed the triage vital signs and the nursing notes.   HISTORY  Chief Complaint Epistaxis   History limited by: Not Limited   HPI Diamond Boyd is a 75 y.o. female who called EMS tonight because of concerns for epistaxis. The patient states she has been having nosebleeds on and off for a few weeks. She has not seen anybody for her nosebleeds. She was recently seen at [facility] for intermittent fevers. The patient was told at that visit that her platelet count was low. The patient has a history of cancer in there is some concern that she might have recurrence lymphoma. She does describe occasional sweats. The patient was given afrin by EMS with good resolution of the patient's nosebleed.    Past Medical History:  Diagnosis Date  . Anemia    hx blood tx  . Benign essential HTN   . Cancer ([place])    skin cancer- basal cell on arm / colon 2011  . Chronic systolic CHF (congestive heart failure), NYHA class 2 ([place]) 01/2015  . Colon cancer ([place])    2011, s/p surgery, in remission  . Colon polyps   . Degenerative disk disease    Thoracic spine  . Depression   . 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 Generalized weakness 05/11/2015  . GERD (gastroesophageal reflux disease)   . Heart murmur   . hodgkins lymphoma   . Hypothyroidism   . Lymphoma ([place])   . Neuropathy due to chemotherapeutic drug ([place]) 06/19/2014  . NICM (nonischemic cardiomyopathy) ([place]) 01/2015  . Osteoarthritis    hands  . 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 PAF (paroxysmal atrial fibrillation) ([place])   . Peripheral vascular disease ([place])   . Pneumonia     Patient Active Problem List   Diagnosis Date Noted  . Dehydration 12/31/2015  . Neoplastic (malignant) related fatigue  12/31/2015  . Unintentional weight loss 12/31/2015  . Epistaxis 12/31/2015  . Hypoalbuminemia due to protein-calorie malnutrition ([place]) 12/31/2015  . C. difficile colitis   . Colitis 12/11/2015  . Basal cell carcinoma 10/31/2015  . Neuropathy ([place]) 10/31/2015  . S/P cataract surgery 10/31/2015  . Chronic rhinitis 10/31/2015  . Moderate persistent asthma 10/31/2015  . Physical deconditioning 10/16/2015  . Chronic ethmoidal sinusitis 09/18/2015  . 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 Cough 07/18/2015  . Port catheter in place 07/11/2015  . Generalized weakness 05/11/2015  . Benign essential HTN   . PAF (paroxysmal atrial fibrillation) ([place])   . Internal hemorrhoids 02/21/2015  . Rectal bleeding 02/06/2015  . 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 Thrombocytopenia ([place]) 01/29/2015  . AKI (acute kidney injury) ([place])   . Elevated troponin   . Atrial fibrillation with rapid ventricular response ([place]) 01/23/2015  . New onset a-fib ([place]) 01/23/2015  . Hypertension 01/23/2015  . CHF (congestive heart failure) ([place]) 01/21/2015  . Chronic systolic CHF (congestive heart failure), NYHA class 2 ([place]) 01/11/2015  . Fever in adult 01/10/2015  . Immunocompromised status associated with infection ([place]) 01/08/2015  . 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 Preventive measure 11/14/2014  . Parotid mass 11/14/2014  . Chronic lower back pain 09/28/2014  . Mediastinal lymphadenopathy 09/27/2014  .  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 Peripheral neuropathy due to chemotherapy ([place]) 04/03/2014  . Trochanteric bursitis of right hip 04/03/2014  . Anemia in chronic illness 02/21/2014  . Elevated liver enzymes 02/21/2014  . Urinary tract infection 11/18/2013  . Candidal intertrigo 09/23/2013  . Atrophic vaginitis 09/23/2013  .  Drug-induced neutropenia ([place]) 09/01/2013  . History of colon cancer 06/17/2013  . History of Hodgkin's lymphoma 05/20/2013  . Conjunctivitis, acute 04/27/2013  . Cold sore 04/27/2013  . Hyperglycemia 03/11/2013  . Hodgkin lymphoma ([place]) 02/10/2013  . Acute maxillary sinusitis 02/09/2013  . Other malaise and fatigue 01/04/2013  . Shingles 11/22/2012  . Cystocele 09/22/2012  . Encounter for Medicare annual wellness exam 09/17/2012  . Left knee pain 09/10/2012  . Thoracic back pain 06/01/2012  . Skin lesion of face 05/17/2012  . Other screening mammogram 07/22/2011  . Routine gynecological examination 07/22/2011  . Colon cancer screening 07/22/2011  . History of anemia 05/06/2011  . 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 Hypothyroid 02/24/2011  . Varicose veins 02/24/2011  . Obesity 11/20/2010  . Colon cancer ([place]) 08/23/2009  . Hyperphosphatemia 08/09/2009  . Chronic maxillary sinusitis 07/03/2009  . THROAT PAIN, CHRONIC 07/03/2009  . S/P colon resection 02/10/2009  . GERD 12/20/2008  . Anxiety state 08/22/2008  . MENOPAUSAL SYNDROME 07/13/2007  . High cholesterol 07/08/2006  . Allergic rhinitis 07/08/2006  . OVERACTIVE BLADDER 07/08/2006  . 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 HYPOTHYROIDISM 06/25/2006  . Depression 06/25/2006  . PERIPHERAL VASCULAR DISEASE 06/25/2006  . OSTEOARTHRITIS 06/25/2006  . LEG EDEMA 06/25/2006  . SKIN CANCER, HX OF 06/25/2006  . COLONIC POLYPS, HX OF 06/25/2006    Past Surgical History:  Procedure Laterality Date  . BONE MARROW BIOPSY  05/27/13  . BREAST BIOPSY  1996  . CARDIAC CATHETERIZATION N/A 01/25/2015   Procedure: Left Heart Cath and Coronary Angiography;  Surgeon: Peter M Martinique, MD;  Location: [facility];  Service: Cardiovascular;  Laterality: N/A;  . CATARACT EXTRACTION, BILATERAL  2016  . CHOLECYSTECTOMY    . COLECTOMY  8/11  . COLONOSCOPY W/ BIOPSIES    . DG BIOPSY LUNG    . LYMPH NODE BIOPSY N/A 11/01/2014    Procedure: MEDIASTINAL LYMPH NODE BIOPSY;  Surgeon: Grace Isaac, MD;  Location: [facility];  Service: Thoracic;  Laterality: N/A;  . MEDIASTINOSCOPY N/A 11/01/2014   Procedure: MEDIASTINOSCOPY;  Surgeon: Grace Isaac, MD;  Location: [facility];  Service: Thoracic;  Laterality: N/A;  . PLANTAR FASCIA RELEASE    . port-a-cath insertion    . SPLENECTOMY     lymphoma  . TENDON RELEASE     Right thumb   . VAGINAL HYSTERECTOMY    . VENTRAL HERNIA REPAIR  1998  . VIDEO BRONCHOSCOPY WITH ENDOBRONCHIAL ULTRASOUND N/A 10/17/2014   Procedure: VIDEO BRONCHOSCOPY WITH ENDOBRONCHIAL ULTRASOUND;  Surgeon: Javier Glazier, MD;  Location: [facility];  Service: Thoracic;  Laterality: N/A;  . VIDEO BRONCHOSCOPY WITH ENDOBRONCHIAL ULTRASOUND N/A 11/01/2014   Procedure: VIDEO BRONCHOSCOPY WITH ENDOBRONCHIAL ULTRASOUND;  Surgeon: Grace Isaac, MD;  Location: [facility];  Service: Thoracic;  Laterality: N/A;    Prior to Admission medications   Medication Sig Start Date End Date Taking? Authorizing Provider  acetaminophen (TYLENOL) 650 MG CR tablet Take 650 mg by mouth every 8 (eight) hours as needed for pain or fever.     Historical Provider, MD  Acidophilus Lactobacillus CAPS Take 1 capsule by mouth 3 (three) times  daily. 12/16/15   Doreatha Lew, MD  acyclovir (ZOVIRAX) 400 MG tablet Take 1 tablet (400 mg total) by mouth daily. 10/29/15   Abner Greenspan, MD  Albuterol Sulfate (PROAIR RESPICLICK) 130 (90 Base) MCG/ACT AEPB Inhale 1-2 puffs into the lungs every 6 (six) hours as needed (SOB, wheezing).     Historical Provider, MD  BREO ELLIPTA 200-25 MCG/INH AEPB Inhale 1 puff into the lungs every evening.  11/22/15   Historical Provider, MD  Calcium Carbonate-Vitamin D (CALCIUM 600+D) 600-400 MG-UNIT per tablet Take 1 tablet by mouth every evening.     Historical Provider, MD  celecoxib (CELEBREX) 200 MG capsule Take 1 capsule (200 mg total) by mouth daily as needed for moderate pain. Patient not taking: Reported on  01/01/2016 10/29/15   Abner Greenspan, MD  Cholecalciferol (VITAMIN D-3) 1000 UNITS CAPS Take 1,000 Units by mouth daily.    Historical Provider, MD  docusate sodium (COLACE) 100 MG capsule Take 100 mg by mouth at bedtime.    Historical Provider, MD  estradiol (ESTRACE) 1 MG tablet Take 1 tablet (1 mg total) by mouth daily. 10/29/15   Abner Greenspan, MD  fexofenadine (ALLEGRA) 180 MG tablet TAKE 1 TABLET (180 MG TOTAL) BY MOUTH DAILY. (*OTC) 08/01/15   Historical Provider, MD  FLUoxetine (PROZAC) 20 MG capsule Take 1 capsule (20 mg total) by mouth daily. 10/29/15   Abner Greenspan, MD  fluticasone (FLONASE) 50 MCG/ACT nasal spray Place 2 sprays into both nostrils daily. Patient not taking: Reported on 01/01/2016 10/29/15   Abner Greenspan, MD  furosemide (LASIX) 20 MG tablet Take 1 tablet (20 mg total) by mouth daily. Take 1 tab by mouth daily for weight gain. 10/29/15   Abner Greenspan, MD  gabapentin (NEURONTIN) 300 MG capsule Take 1 capsule (300 mg total) by mouth 2 (two) times daily. 10/29/15   Abner Greenspan, MD  levothyroxine (SYNTHROID, LEVOTHROID) 112 MCG tablet Take 1 tablet (112 mcg total) by mouth daily before breakfast. 10/29/15   Abner Greenspan, MD  lidocaine (XYLOCAINE) 5 % ointment Apply 1 application topically 3 (three) times daily as needed. To rectal area 12/11/15   Abner Greenspan, MD  lidocaine-prilocaine (EMLA) cream Apply 1 application topically as needed (For port-a-cath.). Reported on 02/22/2015    Historical Provider, MD  LORazepam (ATIVAN) 0.5 MG tablet Take 1 tablet (0.5 mg total) by mouth every 12 (twelve) hours as needed for anxiety. 12/31/15   Susanne Borders, NP  losartan (COZAAR) 25 MG tablet Take 1 tablet (25 mg total) by mouth daily. 10/29/15   Abner Greenspan, MD  metoprolol (LOPRESSOR) 50 MG tablet TAKE 1 TABLET (50 MG TOTAL) BY MOUTH 2 (TWO) TIMES DAILY. 10/29/15   Abner Greenspan, MD  Omega-3 350 MG CAPS Take 1 capsule by mouth daily. Reported on 02/22/2015    Historical Provider, MD   omeprazole (PRILOSEC) 40 MG capsule  01/01/16   Historical Provider, MD  ondansetron (ZOFRAN) 8 MG tablet Take 8 mg by mouth every 6 (six) hours as needed for nausea or vomiting. Reported on 03/05/2015 12/25/14   Historical Provider, MD  polyethylene glycol (MIRALAX / GLYCOLAX) packet Take 17 g by mouth daily as needed for moderate constipation.     Historical Provider, MD  prochlorperazine (COMPAZINE) 10 MG tablet Take 10 mg by mouth every 6 (six) hours as needed for nausea or vomiting. Reported on 03/05/2015 12/25/14   Historical Provider, MD  psyllium (METAMUCIL) 58.6 %  packet Take 1-3 packets by mouth at bedtime.     Historical Provider, MD  simvastatin (ZOCOR) 40 MG tablet Take 1 tablet (40 mg total) by mouth at bedtime. 10/29/15   Abner Greenspan, MD    Allergies Achromycin [tetracycline hcl]; Allopurinol; Astelin [azelastine hcl]; Cephalexin; Codeine; Lisinopril; Meloxicam; Minocycline; Nabumetone; Nyquil [pseudoeph-doxylamine-dm-apap]; Sulfa antibiotics; Zolpidem tartrate; Buspar [buspirone hcl]; and Ciprofloxacin  Family History  Problem Relation Age of Onset  . Coronary artery disease Mother   . Diabetes Mother   . Fibromyalgia Daughter     chronic pain   . COPD Daughter   . Diabetes Brother   . Asthma Daughter   . Rheum arthritis Brother   . Clotting disorder Brother   . Alcohol abuse Sister   . Colon cancer Neg Hx   . Colon polyps Neg Hx   . Stomach cancer Neg Hx   . Rectal cancer Neg Hx   . Ulcerative colitis Neg Hx   . Crohn's disease Neg Hx     Social History Social History  Substance Use Topics  . Smoking status: Never Smoker  . Smokeless tobacco: Never Used     Comment: Second-hand exposure through father.  . Alcohol use No    Review of Systems  Constitutional: Positive for intermittent fevers.  Cardiovascular: Negative for chest pain. Respiratory: Negative for shortness of breath. Gastrointestinal: Negative for abdominal pain, vomiting and  diarrhea. Genitourinary: Negative for dysuria. Musculoskeletal: Negative for back pain. Skin: Negative for rash. Neurological: Negative for headaches, focal weakness or numbness.  10-point ROS otherwise negative.  ____________________________________________   PHYSICAL EXAM:  VITAL SIGNS: ED Triage Vitals  Enc Vitals Group     BP 01/06/16 2122 (!) 96/53     Pulse Rate 01/06/16 2122 89     Resp 01/06/16 2122 16     Temp 01/06/16 2122 98.9 F (37.2 C)     Temp Source 01/06/16 2122 Oral     SpO2 01/06/16 2122 100 %     Weight 01/06/16 2124 228 lb (103.4 kg)     Height 01/06/16 2124 _0  (1.676 m)     Head Circumference --      Peak Flow --      Pain Score 01/06/16 2141 0   Constitutional: Alert and oriented. Well appearing and in no distress. Eyes: Conjunctivae are normal. Normal extraocular movements. ENT   Head: Normocephalic and atraumatic.   Nose: No congestion/rhinnorhea.   Mouth/Throat: Mucous membranes are moist.   Neck: Dried blood in bilateral nares. Hematological/Lymphatic/Immunilogical: No cervical lymphadenopathy. Cardiovascular: Normal rate, regular rhythm.  No murmurs, rubs, or gallops. Respiratory: Normal respiratory effort without tachypnea nor retractions. Breath sounds are clear and equal bilaterally. No wheezes/rales/rhonchi. Gastrointestinal: Soft and nontender. No distention.  Genitourinary: Deferred Musculoskeletal: Normal range of motion in all extremities. No lower extremity edema. Neurologic:  Normal speech and language. No gross focal neurologic deficits are appreciated.  Skin:  Skin is warm, dry and intact. No rash noted. Psychiatric: Mood and affect are normal. Speech and behavior are normal. Patient exhibits appropriate insight and judgment.  ____________________________________________    LABS (pertinent positives/negatives)  Na 129 Potassium 3.7 Cr 0.90 WBC 11.4 Hgb 11.1 Pl  17 ____________________________________________   EKG  None  ____________________________________________    RADIOLOGY  None  ____________________________________________   PROCEDURES  Procedures CRITICAL CARE Performed by: Nance Pear   Total critical care time: 30 minutes  Critical care time was exclusive of separately billable procedures and treating other patients.  Critical care was necessary to treat or prevent imminent or life-threatening deterioration.  Critical care was time spent personally by me on the following activities: development of treatment plan with patient and/or surrogate as well as nursing, discussions with consultants, evaluation of patient's response to treatment, examination of patient, obtaining history from patient or surrogate, ordering and performing treatments and interventions, ordering and review of laboratory studies, ordering and review of radiographic studies, pulse oximetry and re-evaluation of patient's condition.  ____________________________________________   INITIAL IMPRESSION / ASSESSMENT AND PLAN / ED COURSE  Pertinent labs & imaging results that were available during my care of the patient were reviewed by me and considered in my medical decision making (see chart for details).  Patient presented to the emergency department today because of concerns for nosebleed. Patient states that she is bleeding on and off for the past few weeks. She does state that she was seen in outside emergency department recently and was told that she had a low platelet count. In addition to that mostly she is also been feeling weak. On exam patient does have dried blood in bilateral nares however no active bleeding appreciated. Vital signs without hypotension or tachycardia. Will have her check blood work.  Clinical Course    Patient's blood work was notable for a couple findings. One is that her platelet count is continued to fall. It is now 42.  In the setting of nosebleeds I did contact Dr. Rogue Bussing with [facility] who recommended transfusion and admission. Additionally the patient's sodium level is found to be low. ____________________________________________   FINAL CLINICAL IMPRESSION(S) / ED DIAGNOSES  Final diagnoses:  Epistaxis  Thrombocytopenia ([place])  Hyponatremia     Note: This dictation was prepared with Dragon dictation. Any transcriptional errors that result from this process are unintentional    Nance Pear, MD 01/07/16 1551

## 2016-01-06 NOTE — ED Triage Notes (Addendum)
Pt arrived from home by EMS with c/o epistaxis and low platelet counts. EMS reports pt has hx of cancer (not currently receiving chemo or radiation), nose bleed x3 days, was told by [facility] that pt had low platelet count x1 week ago (no intervention provided.) Original BP upon arrival to pts home (86/60.) Upon arrival to ED pt is A&O x4, diaphoretic, not pale in color, epistaxis controlled by afrin given in route.

## 2016-01-07 DIAGNOSIS — G629 Polyneuropathy, unspecified: Secondary | ICD-10-CM

## 2016-01-07 DIAGNOSIS — K219 Gastro-esophageal reflux disease without esophagitis: Secondary | ICD-10-CM

## 2016-01-07 DIAGNOSIS — R011 Cardiac murmur, unspecified: Secondary | ICD-10-CM

## 2016-01-07 DIAGNOSIS — D649 Anemia, unspecified: Secondary | ICD-10-CM | POA: Diagnosis not present

## 2016-01-07 DIAGNOSIS — M1990 Unspecified osteoarthritis, unspecified site: Secondary | ICD-10-CM

## 2016-01-07 DIAGNOSIS — C8220 Follicular lymphoma grade III, unspecified, unspecified site: Secondary | ICD-10-CM | POA: Diagnosis not present

## 2016-01-07 DIAGNOSIS — D696 Thrombocytopenia, unspecified: Secondary | ICD-10-CM

## 2016-01-07 DIAGNOSIS — I739 Peripheral vascular disease, unspecified: Secondary | ICD-10-CM

## 2016-01-07 DIAGNOSIS — M5134 Other intervertebral disc degeneration, thoracic region: Secondary | ICD-10-CM

## 2016-01-07 DIAGNOSIS — Z8701 Personal history of pneumonia (recurrent): Secondary | ICD-10-CM

## 2016-01-07 DIAGNOSIS — R531 Weakness: Secondary | ICD-10-CM

## 2016-01-07 DIAGNOSIS — E039 Hypothyroidism, unspecified: Secondary | ICD-10-CM

## 2016-01-07 DIAGNOSIS — C8590 Non-Hodgkin lymphoma, unspecified, unspecified site: Secondary | ICD-10-CM | POA: Diagnosis not present

## 2016-01-07 DIAGNOSIS — I428 Other cardiomyopathies: Secondary | ICD-10-CM

## 2016-01-07 DIAGNOSIS — R509 Fever, unspecified: Secondary | ICD-10-CM

## 2016-01-07 DIAGNOSIS — D801 Nonfamilial hypogammaglobulinemia: Secondary | ICD-10-CM | POA: Diagnosis not present

## 2016-01-07 DIAGNOSIS — Z86010 Personal history of colonic polyps: Secondary | ICD-10-CM

## 2016-01-07 DIAGNOSIS — R040 Epistaxis: Secondary | ICD-10-CM

## 2016-01-07 DIAGNOSIS — I10 Essential (primary) hypertension: Secondary | ICD-10-CM

## 2016-01-07 DIAGNOSIS — I5022 Chronic systolic (congestive) heart failure: Secondary | ICD-10-CM

## 2016-01-07 DIAGNOSIS — Z79899 Other long term (current) drug therapy: Secondary | ICD-10-CM

## 2016-01-07 DIAGNOSIS — I480 Paroxysmal atrial fibrillation: Secondary | ICD-10-CM

## 2016-01-07 DIAGNOSIS — Z85828 Personal history of other malignant neoplasm of skin: Secondary | ICD-10-CM

## 2016-01-07 DIAGNOSIS — E871 Hypo-osmolality and hyponatremia: Secondary | ICD-10-CM | POA: Diagnosis not present

## 2016-01-07 DIAGNOSIS — Z85038 Personal history of other malignant neoplasm of large intestine: Secondary | ICD-10-CM

## 2016-01-07 DIAGNOSIS — F329 Major depressive disorder, single episode, unspecified: Secondary | ICD-10-CM

## 2016-01-07 LAB — BASIC METABOLIC PANEL
ANION GAP: 8 (ref 5–15)
BUN: 17 mg/dL (ref 6–20)
CALCIUM: 8.2 mg/dL — AB (ref 8.9–10.3)
CO2: 23 mmol/L (ref 22–32)
Chloride: 102 mmol/L (ref 101–111)
Creatinine, Ser: 0.81 mg/dL (ref 0.44–1.00)
Glucose, Bld: 139 mg/dL — ABNORMAL HIGH (ref 65–99)
Potassium: 3.2 mmol/L — ABNORMAL LOW (ref 3.5–5.1)
SODIUM: 133 mmol/L — AB (ref 135–145)

## 2016-01-07 LAB — CBC
HCT: 31.2 % — ABNORMAL LOW (ref 35.0–47.0)
HEMOGLOBIN: 10.2 g/dL — AB (ref 12.0–16.0)
MCH: 30.8 pg (ref 26.0–34.0)
MCHC: 32.7 g/dL (ref 32.0–36.0)
MCV: 94.1 fL (ref 80.0–100.0)
Platelets: 50 10*3/uL — ABNORMAL LOW (ref 150–440)
RBC: 3.31 MIL/uL — AB (ref 3.80–5.20)
RDW: 21.1 % — ABNORMAL HIGH (ref 11.5–14.5)
WBC: 9.2 10*3/uL (ref 3.6–11.0)

## 2016-01-07 MED ORDER — ACETAMINOPHEN 650 MG RE SUPP: 650.0000 mg | Freq: Four times a day (QID) | RECTAL | Status: DC | PRN

## 2016-01-07 MED ORDER — ONDANSETRON HCL 4 MG/2ML IJ SOLN
4.0000 mg | Freq: Four times a day (QID) | INTRAMUSCULAR | Status: DC | PRN
Start: 2016-01-07 — End: 2016-01-07

## 2016-01-07 MED ORDER — METOPROLOL TARTRATE 50 MG PO TABS
50.0000 mg | ORAL_TABLET | Freq: Two times a day (BID) | ORAL | Status: DC
  Administered 2016-01-07 – 2016-01-13 (×9): 50 mg via ORAL
  Filled 2016-01-07 (×11): qty 1

## 2016-01-07 MED ORDER — LEVOTHYROXINE SODIUM 112 MCG PO TABS
112.0000 ug | ORAL_TABLET | Freq: Every day | ORAL | Status: DC
  Administered 2016-01-07 – 2016-01-13 (×7): 112 ug via ORAL
  Filled 2016-01-07 (×7): qty 1

## 2016-01-07 MED ORDER — MAGNESIUM CITRATE PO SOLN
1.0000 | Freq: Once | ORAL | Status: DC | PRN
  Filled 2016-01-07: qty 296

## 2016-01-07 MED ORDER — SODIUM CHLORIDE 0.9 % IV SOLN
INTRAVENOUS | Status: DC
  Administered 2016-01-07: 03:00:00 via INTRAVENOUS

## 2016-01-07 MED ORDER — BOOST / RESOURCE BREEZE PO LIQD
1.0000 | Freq: Three times a day (TID) | ORAL | Status: DC
  Administered 2016-01-07: 1 via ORAL

## 2016-01-07 MED ORDER — SIMVASTATIN 40 MG PO TABS
40.0000 mg | ORAL_TABLET | Freq: Every day | ORAL | Status: DC
  Administered 2016-01-07 – 2016-01-12 (×6): 40 mg via ORAL
  Filled 2016-01-07 (×6): qty 1

## 2016-01-07 MED ORDER — SALINE SPRAY 0.65 % NA SOLN
1.0000 | NASAL | Status: DC | PRN
  Administered 2016-01-07: 11:00:00 1 via NASAL
  Filled 2016-01-07 (×2): qty 44

## 2016-01-07 MED ORDER — BISACODYL 5 MG PO TBEC: 5.0000 mg | DELAYED_RELEASE_TABLET | Freq: Every day | ORAL | Status: DC | PRN

## 2016-01-07 MED ORDER — GABAPENTIN 300 MG PO CAPS
300.0000 mg | ORAL_CAPSULE | Freq: Two times a day (BID) | ORAL | Status: DC
  Administered 2016-01-07 – 2016-01-13 (×13): 300 mg via ORAL
  Filled 2016-01-07 (×14): qty 1

## 2016-01-07 MED ORDER — LIDOCAINE-PRILOCAINE 2.5-2.5 % EX CREA
1.0000 "application " | TOPICAL_CREAM | CUTANEOUS | Status: DC | PRN
  Filled 2016-01-07: qty 5

## 2016-01-07 MED ORDER — ONDANSETRON HCL 4 MG/2ML IJ SOLN: 4.0000 mg | Freq: Four times a day (QID) | INTRAMUSCULAR | Status: DC | PRN

## 2016-01-07 MED ORDER — ONDANSETRON HCL 4 MG PO TABS: 8.0000 mg | ORAL_TABLET | Freq: Four times a day (QID) | ORAL | Status: DC | PRN

## 2016-01-07 MED ORDER — LORATADINE 10 MG PO TABS
10.0000 mg | ORAL_TABLET | Freq: Every day | ORAL | Status: DC
  Administered 2016-01-07 – 2016-01-13 (×7): 10 mg via ORAL
  Filled 2016-01-07 (×7): qty 1

## 2016-01-07 MED ORDER — ONDANSETRON HCL 4 MG PO TABS: 4.0000 mg | ORAL_TABLET | Freq: Four times a day (QID) | ORAL | Status: DC | PRN

## 2016-01-07 MED ORDER — LOSARTAN POTASSIUM 25 MG PO TABS: 25.0000 mg | ORAL_TABLET | Freq: Every day | ORAL | Status: DC

## 2016-01-07 MED ORDER — SENNOSIDES-DOCUSATE SODIUM 8.6-50 MG PO TABS: 1.0000 | ORAL_TABLET | Freq: Every evening | ORAL | Status: DC | PRN

## 2016-01-07 MED ORDER — POTASSIUM CHLORIDE 20 MEQ PO PACK
40.0000 meq | PACK | Freq: Once | ORAL | Status: AC
  Administered 2016-01-07: 14:00:00 40 meq via ORAL
  Filled 2016-01-07: qty 2

## 2016-01-07 MED ORDER — FLUTICASONE FUROATE-VILANTEROL 200-25 MCG/INH IN AEPB
1.0000 | INHALATION_SPRAY | Freq: Every evening | RESPIRATORY_TRACT | Status: DC
  Administered 2016-01-07 – 2016-01-12 (×6): 1 via RESPIRATORY_TRACT
  Filled 2016-01-07: qty 28

## 2016-01-07 MED ORDER — ACIDOPHILUS LACTOBACILLUS PO CAPS: 1.0000 | ORAL_CAPSULE | Freq: Three times a day (TID) | ORAL | Status: DC

## 2016-01-07 MED ORDER — PROCHLORPERAZINE MALEATE 10 MG PO TABS: 10.0000 mg | ORAL_TABLET | Freq: Four times a day (QID) | ORAL | Status: DC | PRN

## 2016-01-07 MED ORDER — OMEGA-3-ACID ETHYL ESTERS 1 G PO CAPS
1.0000 g | ORAL_CAPSULE | Freq: Every day | ORAL | Status: DC
  Administered 2016-01-07 – 2016-01-13 (×7): 1 g via ORAL
  Filled 2016-01-07 (×7): qty 1

## 2016-01-07 MED ORDER — CALCIUM CARBONATE-VITAMIN D 500-200 MG-UNIT PO TABS
1.0000 | ORAL_TABLET | Freq: Every evening | ORAL | Status: DC
  Administered 2016-01-07 – 2016-01-12 (×6): 1 via ORAL
  Filled 2016-01-07 (×6): qty 1

## 2016-01-07 MED ORDER — PANTOPRAZOLE SODIUM 40 MG PO TBEC
40.0000 mg | DELAYED_RELEASE_TABLET | Freq: Every day | ORAL | Status: DC
  Administered 2016-01-07 – 2016-01-13 (×7): 40 mg via ORAL
  Filled 2016-01-07 (×7): qty 1

## 2016-01-07 MED ORDER — VITAMIN D 1000 UNITS PO TABS
1000.0000 [IU] | ORAL_TABLET | Freq: Every day | ORAL | Status: DC
  Administered 2016-01-07 – 2016-01-13 (×7): 1000 [IU] via ORAL
  Filled 2016-01-07 (×7): qty 1

## 2016-01-07 MED ORDER — ACYCLOVIR 200 MG PO CAPS
400.0000 mg | ORAL_CAPSULE | Freq: Every day | ORAL | Status: DC
  Administered 2016-01-07 – 2016-01-13 (×7): 400 mg via ORAL
  Filled 2016-01-07 (×7): qty 2

## 2016-01-07 MED ORDER — POLYETHYLENE GLYCOL 3350 17 G PO PACK: 17.0000 g | PACK | Freq: Every day | ORAL | Status: DC | PRN

## 2016-01-07 MED ORDER — ALBUTEROL SULFATE (2.5 MG/3ML) 0.083% IN NEBU: 3.0000 mL | INHALATION_SOLUTION | Freq: Four times a day (QID) | RESPIRATORY_TRACT | Status: DC | PRN

## 2016-01-07 MED ORDER — OMEGA-3 350 MG PO CAPS: 1.0000 | ORAL_CAPSULE | Freq: Every day | ORAL | Status: DC

## 2016-01-07 MED ORDER — FUROSEMIDE 20 MG PO TABS
20.0000 mg | ORAL_TABLET | Freq: Every day | ORAL | Status: DC
  Administered 2016-01-07 – 2016-01-13 (×7): 20 mg via ORAL
  Filled 2016-01-07 (×7): qty 1

## 2016-01-07 MED ORDER — DOCUSATE SODIUM 100 MG PO CAPS
100.0000 mg | ORAL_CAPSULE | Freq: Every day | ORAL | Status: DC
  Administered 2016-01-07 – 2016-01-08 (×2): 100 mg via ORAL
  Filled 2016-01-07 (×2): qty 1

## 2016-01-07 MED ORDER — ESTRADIOL 1 MG PO TABS
1.0000 mg | ORAL_TABLET | Freq: Every day | ORAL | Status: DC
  Administered 2016-01-07 – 2016-01-13 (×7): 1 mg via ORAL
  Filled 2016-01-07 (×7): qty 1

## 2016-01-07 MED ORDER — FLUOXETINE HCL 20 MG PO CAPS
20.0000 mg | ORAL_CAPSULE | Freq: Every day | ORAL | Status: DC
  Administered 2016-01-07 – 2016-01-13 (×7): 20 mg via ORAL
  Filled 2016-01-07 (×7): qty 1

## 2016-01-07 MED ORDER — RISAQUAD PO CAPS
1.0000 | ORAL_CAPSULE | Freq: Three times a day (TID) | ORAL | Status: DC
  Administered 2016-01-07 – 2016-01-13 (×20): 1 via ORAL
  Filled 2016-01-07 (×20): qty 1

## 2016-01-07 MED ORDER — ACETAMINOPHEN 325 MG PO TABS
650.0000 mg | ORAL_TABLET | Freq: Four times a day (QID) | ORAL | Status: DC | PRN
  Administered 2016-01-07 – 2016-01-12 (×10): 650 mg via ORAL
  Filled 2016-01-07 (×10): qty 2

## 2016-01-07 NOTE — H&P (Signed)
[place] @ [facility] Admission History and Physical Diamond Boyd, D.O.  ---------------------------------------------------------------------------------------------------------------------   PATIENT NAME: Diamond Boyd MR#: [id number] DATE OF BIRTH: October 19, 1940 DATE OF ADMISSION: 01/06/2016 PRIMARY CARE PHYSICIAN: [name], MD  REQUESTING/REFERRING PHYSICIAN: ED Dr. Archie Balboa  CHIEF COMPLAINT: Chief Complaint  Patient presents with  . Epistaxis    HISTORY OF PRESENT ILLNESS: Diamond Boyd is a 75 y.o. female with a known history of Lymphoma, chronic systolic CHF, colon cancer, hypothyroidism and anemia presents to the emergency department complaining of epistaxis for which she received Afrin by EMS. The patient states she has been having nosebleeds on and off for a few weeks. Patient was seen recently for intermittent fevers and was told that her platelet count was low and possibly suggestive of a recurrence of lymphoma..  Otherwise there has been no change in status. Patient has been taking medication as prescribed and there has been no recent change in medication or diet.  There has been no recent illness, travel or sick contacts.    Patient denies fevers/chills, weakness, dizziness, chest pain, shortness of breath, N/V/C/D, abdominal pain, dysuria/frequency, changes in mental status.   Oncology was contacted by the emergency department physician he recommended platelet transfusion due to severe thrombocytopenia therefore hospitalists were contacted for admission.  PAST MEDICAL HISTORY: Past Medical History:  Diagnosis Date  . Anemia    hx blood tx  . Benign essential HTN   . Cancer ([place])    skin cancer- basal cell on arm / colon 2011  . Chronic systolic CHF (congestive heart failure), NYHA class 2 ([place]) 01/2015  . Colon cancer ([place])    2011, s/p surgery, in remission  . Colon polyps   . Degenerative disk disease    Thoracic spine  . Depression    . 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 Generalized weakness 05/11/2015  . GERD (gastroesophageal reflux disease)   . Heart murmur   . hodgkins lymphoma   . Hypothyroidism   . Lymphoma ([place])   . Neuropathy due to chemotherapeutic drug ([place]) 06/19/2014  . NICM (nonischemic cardiomyopathy) ([place]) 01/2015  . Osteoarthritis    hands  . 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 PAF (paroxysmal atrial fibrillation) ([place])   . Peripheral vascular disease ([place])   . Pneumonia       PAST SURGICAL HISTORY: Past Surgical History:  Procedure Laterality Date  . BONE MARROW BIOPSY  05/27/13  . BREAST BIOPSY  1996  . CARDIAC CATHETERIZATION N/A 01/25/2015   Procedure: Left Heart Cath and Coronary Angiography;  Surgeon: Peter M Martinique, MD;  Location: [place] [facility];  Service: Cardiovascular;  Laterality: N/A;  . CATARACT EXTRACTION, BILATERAL  2016  . CHOLECYSTECTOMY    . COLECTOMY  8/11  . COLONOSCOPY W/ BIOPSIES    . DG BIOPSY LUNG    . LYMPH NODE BIOPSY N/A 11/01/2014   Procedure: MEDIASTINAL LYMPH NODE BIOPSY;  Surgeon: Grace Isaac, MD;  Location: [place];  Service: Thoracic;  Laterality: N/A;  . MEDIASTINOSCOPY N/A 11/01/2014   Procedure: MEDIASTINOSCOPY;  Surgeon: Grace Isaac, MD;  Location: [place];  Service: Thoracic;  Laterality: N/A;  . PLANTAR FASCIA RELEASE    . port-a-cath insertion    . SPLENECTOMY     lymphoma  . TENDON RELEASE     Right thumb   . VAGINAL HYSTERECTOMY    . VENTRAL HERNIA REPAIR  [place] WITH ENDOBRONCHIAL ULTRASOUND N/A 10/17/2014   Procedure: VIDEO BRONCHOSCOPY WITH ENDOBRONCHIAL ULTRASOUND;  Surgeon: Javier Glazier, MD;  Location: [place];  Service: Thoracic;  Laterality: N/A;  . VIDEO BRONCHOSCOPY WITH ENDOBRONCHIAL ULTRASOUND N/A 11/01/2014   Procedure: VIDEO BRONCHOSCOPY WITH ENDOBRONCHIAL ULTRASOUND;  Surgeon: Grace Isaac, MD;   Location: [place];  Service: Thoracic;  Laterality: N/A;      SOCIAL HISTORY: Social History  Substance Use Topics  . Smoking status: Never Smoker  . Smokeless tobacco: Never Used     Comment: Second-hand exposure through father.  . Alcohol use No      FAMILY HISTORY: Family History  Problem Relation Age of Onset  . Coronary artery disease Mother   . Diabetes Mother   . Fibromyalgia Daughter     chronic pain   . COPD Daughter   . Diabetes Brother   . Asthma Daughter   . Rheum arthritis Brother   . Clotting disorder Brother   . Alcohol abuse Sister   . Colon cancer Neg Hx   . Colon polyps Neg Hx   . Stomach cancer Neg Hx   . Rectal cancer Neg Hx   . Ulcerative colitis Neg Hx   . Crohn's disease Neg Hx      MEDICATIONS AT HOME: Prior to Admission medications   Medication Sig Start Date End Date Taking? Authorizing Provider  acetaminophen (TYLENOL) 650 MG CR tablet Take 650 mg by mouth every 8 (eight) hours as needed for pain or fever.    Yes Historical Provider, MD  Acidophilus Lactobacillus CAPS Take 1 capsule by mouth 3 (three) times daily. 12/16/15  Yes Doreatha Lew, MD  acyclovir (ZOVIRAX) 400 MG tablet Take 1 tablet (400 mg total) by mouth daily. 10/29/15  Yes Abner Greenspan, MD  Albuterol Sulfate (PROAIR RESPICLICK) 509 (90 Base) MCG/ACT AEPB Inhale 1-2 puffs into the lungs every 6 (six) hours as needed (SOB, wheezing).    Yes Historical Provider, MD  BREO ELLIPTA 200-25 MCG/INH AEPB Inhale 1 puff into the lungs every evening.  11/22/15  Yes Historical Provider, MD  Calcium Carbonate-Vitamin D (CALCIUM 600+D) 600-400 MG-UNIT per tablet Take 1 tablet by mouth every evening.    Yes Historical Provider, MD  Cholecalciferol (VITAMIN D-3) 1000 UNITS CAPS Take 1,000 Units by mouth daily.   Yes Historical Provider, MD  docusate sodium (COLACE) 100 MG capsule Take 100 mg by mouth at bedtime.   Yes Historical Provider, MD  estradiol (ESTRACE) 1 MG tablet Take 1 tablet (1 mg  total) by mouth daily. 10/29/15  Yes Marne A Tower, MD  fexofenadine (ALLEGRA) 180 MG tablet TAKE 1 TABLET (180 MG TOTAL) BY MOUTH DAILY. (*OTC) 08/01/15  Yes Historical Provider, MD  FLUoxetine (PROZAC) 20 MG capsule Take 1 capsule (20 mg total) by mouth daily. 10/29/15  Yes Abner Greenspan, MD  furosemide (LASIX) 20 MG tablet Take 1 tablet (20 mg total) by mouth daily. Take 1 tab by mouth daily for weight gain. 10/29/15  Yes Abner Greenspan, MD  gabapentin (NEURONTIN) 300 MG capsule Take 1 capsule (300 mg total) by mouth 2 (two) times daily. 10/29/15  Yes Abner Greenspan, MD  levothyroxine (SYNTHROID, LEVOTHROID) 112 MCG tablet Take 1 tablet (112 mcg total) by mouth daily before breakfast. 10/29/15  Yes Abner Greenspan, MD  lidocaine (XYLOCAINE) 5 % ointment Apply 1 application topically 3 (three) times daily as needed. To rectal area 12/11/15  Yes Marne A  Tower, MD  lidocaine-prilocaine (EMLA) cream Apply 1 application topically as needed (For port-a-cath.). Reported on 02/22/2015   Yes Historical Provider, MD  LORazepam (ATIVAN) 0.5 MG tablet Take 1 tablet (0.5 mg total) by mouth every 12 (twelve) hours as needed for anxiety. 12/31/15  Yes Susanne Borders, NP  losartan (COZAAR) 25 MG tablet Take 1 tablet (25 mg total) by mouth daily. 10/29/15  Yes Abner Greenspan, MD  metoprolol (LOPRESSOR) 50 MG tablet TAKE 1 TABLET (50 MG TOTAL) BY MOUTH 2 (TWO) TIMES DAILY. 10/29/15  Yes Abner Greenspan, MD  Omega-3 350 MG CAPS Take 1 capsule by mouth daily. Reported on 02/22/2015   Yes Historical Provider, MD  omeprazole (PRILOSEC) 40 MG capsule  01/01/16  Yes Historical Provider, MD  ondansetron (ZOFRAN) 8 MG tablet Take 8 mg by mouth every 6 (six) hours as needed for nausea or vomiting. Reported on 03/05/2015 12/25/14  Yes Historical Provider, MD  polyethylene glycol (MIRALAX / GLYCOLAX) packet Take 17 g by mouth daily as needed for moderate constipation.    Yes Historical Provider, MD  prochlorperazine (COMPAZINE) 10 MG tablet  Take 10 mg by mouth every 6 (six) hours as needed for nausea or vomiting. Reported on 03/05/2015 12/25/14  Yes Historical Provider, MD  psyllium (METAMUCIL) 58.6 % packet Take 1-3 packets by mouth at bedtime.    Yes Historical Provider, MD  simvastatin (ZOCOR) 40 MG tablet Take 1 tablet (40 mg total) by mouth at bedtime. 10/29/15  Yes Abner Greenspan, MD  celecoxib (CELEBREX) 200 MG capsule Take 1 capsule (200 mg total) by mouth daily as needed for moderate pain. Patient not taking: Reported on 01/06/2016 10/29/15   Abner Greenspan, MD  fluticasone [facility]) 50 MCG/ACT nasal spray Place 2 sprays into both nostrils daily. Patient not taking: Reported on 01/06/2016 10/29/15   Abner Greenspan, MD      DRUG ALLERGIES: Allergies  Allergen Reactions  . Achromycin [Tetracycline Hcl] Other (See Comments)    Pt does not remember reaction  . Allopurinol Other (See Comments)    REACTION: Unsure of reaction happene years ago  . Astelin [Azelastine Hcl] Other (See Comments)    Reaction unknown  . Cephalexin Other (See Comments)    REACTION: unsure of reaction happened yrs ago.  . Codeine Other (See Comments)    REACTION: abd. pain  . Lisinopril Cough  . Meloxicam Other (See Comments)    REACTION: GI symptoms  . Minocycline Other (See Comments)    Abdominal pain  . Nabumetone Other (See Comments)    REACTION: reaction not known  . Nyquil [Pseudoeph-Doxylamine-Dm-Apap] Hives  . Sulfa Antibiotics Other (See Comments)    Gi side eff   . Zolpidem Tartrate Other (See Comments)    REACTION: feels too drugged  . Buspar [Buspirone Hcl] Other (See Comments)    Dizziness, and not as effective for anxiety  . Ciprofloxacin Rash     REVIEW OF SYSTEMS: CONSTITUTIONAL: No fatigue, weakness, chills, weight gain/loss, headache. Positive for intermittent fevers EYES: No blurry or double vision. ENT: No tinnitus, postnasal drip, redness or soreness of the oropharynx. RESPIRATORY: No dyspnea, cough, wheeze,  hemoptysis. CARDIOVASCULAR: No chest pain, orthopnea, palpitations, syncope. GASTROINTESTINAL: No nausea, vomiting, constipation, diarrhea, abdominal pain. No hematemesis, melena or hematochezia. GENITOURINARY: No dysuria, frequency, hematuria. ENDOCRINE: No polyuria or nocturia. No heat or cold intolerance. HEMATOLOGY: No anemia, bruising, bleeding. INTEGUMENTARY: No rashes, ulcers, lesions. MUSCULOSKELETAL: No pain, arthritis, swelling, gout. NEUROLOGIC: No numbness, tingling, weakness or  ataxia. No seizure-type activity. PSYCHIATRIC: No anxiety, depression, insomnia.  PHYSICAL EXAMINATION: VITAL SIGNS: Blood pressure 131/66, pulse 75, temperature 98.9 F (37.2 C), temperature source Oral, resp. rate 19, height 5' 6"  (1.676 m), weight 103.4 kg (228 lb), last menstrual period 02/10/1970, SpO2 97 %.  GENERAL: 75 y.o.-year-old female patient, well-developed, well-nourished lying in the bed in no acute distress.  Pleasant and cooperative.   HEENT: Head atraumatic, normocephalic. Pupils equal, round, reactive to light and accommodation. No scleral icterus. Extraocular muscles intact. Oropharynx is clear. Mucus membranes moist. NECK: Supple, full range of motion. No JVD, no bruit heard. No cervical lymphadenopathy. CHEST: Normal breath sounds bilaterally. No wheezing, rales, rhonchi or crackles. No use of accessory muscles of respiration.  No reproducible chest wall tenderness.  CARDIOVASCULAR: S1, S2 normal. No murmurs, rubs, or gallops appreciated. Cap refill <2 seconds. ABDOMEN: Soft, nontender, nondistended. No rebound, guarding, rigidity. Normoactive bowel sounds present in all four quadrants. No organomegaly or mass. EXTREMITIES: No pedal edema, cyanosis, or clubbing. NEUROLOGIC: Cranial nerves II through XII are grossly intact with no focal sensorimotor deficit. Muscle strength 5/5 in all extremities. Sensation intact. Gait not checked. PSYCHIATRIC: The patient is alert and oriented x 3.  Normal affect, mood, thought content. SKIN: Warm, dry, and intact without obvious rash, lesion, or ulcer.  LABORATORY PANEL:  CBC  Recent Labs Lab 01/06/16 2127  WBC 11.4*  HGB 11.1*  HCT 33.4*  PLT 17*   ----------------------------------------------------------------------------------------------------------------- Chemistries  Recent Labs Lab 01/01/16 1834 01/06/16 2127  NA 132* 129*  K 3.8 3.7  CL 100* 100*  CO2 23 20*  GLUCOSE 97 131*  BUN 16 19  CREATININE 0.88 0.90  CALCIUM 8.8* 8.5*  AST 59*  --   ALT 24  --   ALKPHOS 273*  --   BILITOT 1.0  --    ------------------------------------------------------------------------------------------------------------------ Cardiac Enzymes No results for input(s): TROPONINI in the last 168 hours. ------------------------------------------------------------------------------------------------------------------  RADIOLOGY: No results found.  IMPRESSION AND PLAN:  This is a 75 y.o. female with a history of Lymphoma, chronic systolic CHF, colon cancer, hypothyroidism and anemia now being admitted with: 1. Thrombocytopenia, acute on chronic, possibly secondary to recurrence of lymphoma-I will admit for observation for platelet transfusion which has are to been ordered by the emergency department physician. Oncology consultation has also been requested. 2. Hyponatremia, mild-Will hydrate with IV normal saline and recheck BMP in the a.m. 3. Anemia, chronic-recheck CBC in a.m. 4. Patient to continue her regular home medications  Diet/Nutrition: Heart healthy Fluids: IV normal saline DVT Px: SCDs and early ambulation. Chemoprophylaxis will be contraindicated at this point secondary to severe thrombocytopenia. Code Status: Full  All the records are reviewed and case discussed with ED provider. Management plans discussed with the patient and/or family who express understanding and agree with plan of care.   TOTAL TIME TAKING  CARE OF THIS PATIENT: 60 minutes.   Teffany Blaszczyk D.O. on 01/07/2016 at 1:57 AM Between 7am to 6pm - Pager - [phone] After 6pm go to [email or web address] - [employer] [facility] Office [phone] CC: Primary care physician; [name], MD     Note: This dictation was prepared with Dragon dictation along with smaller phrase technology. Any transcriptional errors that result from this process are unintentional.

## 2016-01-07 NOTE — Progress Notes (Signed)
Initial Nutrition Assessment  DOCUMENTATION CODES:   Obesity unspecified  INTERVENTION:  Provide Boost Breeze po TID, each supplement provides 250 kcal and 9 grams of protein.  Encouraged adequate intake of protein and calories with meals and beverages. Discussed protein options patient can tolerate and chew easily.  Made note on diet order to provide chopped meats and well-cooked vegetables.  NUTRITION DIAGNOSIS:   Inadequate oral intake related to poor appetite, other (see comment) (difficulty chewing) as evidenced by per patient/family report.  GOAL:   Patient will meet greater than or equal to 90% of their needs  MONITOR:   PO intake, Supplement acceptance, Labs, Weight trends, I & O's  REASON FOR ASSESSMENT:   Malnutrition Screening Tool    ASSESSMENT:   75 y.o. female with a known history of Lymphoma, chronic systolic CHF, colon cancer, hypothyroidism and anemia presents to the emergency department complaining of epistaxis for which she received Afrin by EMS. The patient states she has been having nosebleeds on and off for a few weeks. Patient was seen recently for intermittent fevers and was told that her platelet count was low and possibly suggestive of a recurrence of lymphoma.   Spoke with patient at bedside. She reports poor appetite for a few months. She is having difficulty chewing as she is missing some back teeth and has not gone to the dentist yet. Denies N/V, abdominal pain, taste changes, or constipation/diarrhea. Patient reports she is eating less than usual, but did not provide many details upon probing. Reports she still attempts 2-3 meals per day "but not finishing." No meal completion recorded in chart.  Patient does not want Ensure or Boost Plus, but amenable to trying Boost Breeze.  Patient unsure of UBW, but knows she has lost weight. Per chart patient has lost 20 lbs (8% body weight) over 2 months, which is significant for time frame.  Medications  reviewed and include: [place] with D, Vitamin D 1000 units daily, Lasix 20 mg daily, levothyroxine, Lovaza 1 gram daily, pantoprazole.  Labs reviewed: Sodium 133, Potassium 3.2, Glucose 139.   Nutrition-Focused physical exam completed. Findings are no fat depletion, no muscle depletion, and no edema.   Would be able to diagnose malnutrition if had better understanding of patient's intake. She meets criteria for significant weight loss, but NFPE unrevealing in setting of obesity.  Diet Order:  Diet Heart Room service appropriate? Yes; Fluid consistency: Thin  Skin:  Reviewed, no issues  Last BM:  01/07/2016  Height:   Ht Readings from Last 1 Encounters:  01/07/16 '5\' 5"'$  (1.651 m)    Weight:   Wt Readings from Last 1 Encounters:  01/07/16 227 lb 11.2 oz (103.3 kg)    Ideal Body Weight:  56.82 kg  BMI:  Body mass index is 37.89 kg/m.  Estimated Nutritional Needs:   Kcal:  1800-2000 (MSJ x 1.2-1.3)  Protein:  100-125 grams (1-1.2 grams/kg)  Fluid:  >/= 1.8 L/day  EDUCATION NEEDS:   No education needs identified at this time  Willey Blade, MS, RD, LDN Pager: [phone] After Hours Pager: [phone]

## 2016-01-07 NOTE — Progress Notes (Signed)
Noted this morning for epistaxis, thrombocytopenia. Patient received 1 unit of platelets, platelets improved from 17 to 50. Complains of pain in the nose also sinus pain. Has no appetite for the past few months. Labs, previous medical history, medications reviewed.     Thrombocytopenia with epistaxis';epistaxis  is resolving, platelets are improved after transfusion, start  saline nasal spray, appreciate hematology following the patient..  #2 hypothyroidism  #3 chronic systolic heart failure: Start IV hydration to prevent CHF, advance and encourage by mouth intake,  #4 hyponatremia: Improved  #5 hypokalemia: Replace potassium by by mouth.  #6 chronic maxillary sinusitis; she has seen the ENT on October 26.  Received  Augmentin for 10 days  #7 history of non-Hodgkin's lymphoma: According to the medical records patient has  Follicular lymphoma grade 3B;; splenectomy,h/o chemo   time spent;30 min total(reviewed  medical records)

## 2016-01-07 NOTE — ED Notes (Signed)
Sarah from lab states that platelets are coming from [facility], so delayed arrival time, approx. 3 hours.

## 2016-01-07 NOTE — Consult Note (Signed)
[place]  [phone] [phone]) [phone]  ID: Diamond Boyd OB: June 08, 1940  MR#: [id number]  KWI#:[id number]  Patient Care Team: Abner Greenspan, MD as PCP - General Mosetta Anis, MD (Allergy) Fanny Skates, MD as Consulting Physician (General Surgery) Grace Isaac, MD as Consulting Physician (Cardiothoracic Surgery) Troy Sine, MD as Consulting Physician (Cardiology) Lonn Georgia, PA-C as Physician Assistant (Cardiology) [name], MD as Consulting Physician (Hematology and Oncology) Estill Cotta, MD as Consulting Physician (Ophthalmology)  CHIEF COMPLAINT: History of follicular lymphoma, epistaxis, thrombocytopenia.  INTERVAL HISTORY: Patient is a 75 year old female with a long-standing history of follicular lymphoma treated by medical oncology in [place]. Patient states she has been having nosebleeds on and off for several weeks, but then recently had a nosebleed that would not stop. She has also been having intermittent fevers. Upon evaluation in the emergency room patient was found to have significant thrombocytopenia. She subsequently received a platelet transfusion with improvement of her platelet count. She otherwise feels well. She has no neurologic complaints. She denies any chills or night sweats. She has no chest pain or shortness of breath. She denies any nausea, vomiting, constipation, or diarrhea. She has no melena or hematochezia. She has no urinary complaints. Patient otherwise feels well and offers no further specific complaints.  REVIEW OF SYSTEMS:   Review of Systems  Constitutional: Positive for fever. Negative for chills, diaphoresis, malaise/fatigue and weight loss.  Respiratory: Negative.  Negative for cough, hemoptysis and shortness of breath.   Cardiovascular: Negative.  Negative for chest pain and leg swelling.  Gastrointestinal: Negative.  Negative for abdominal pain, blood in stool and melena.    Genitourinary: Negative.  Negative for hematuria.  Musculoskeletal: Negative.   Neurological: Negative.  Negative for weakness.  Endo/Heme/Allergies: Bruises/bleeds easily.  Psychiatric/Behavioral: Negative.  The patient is not nervous/anxious.     As per HPI. Otherwise, a complete review of systems is negative.  PAST MEDICAL HISTORY: Past Medical History:  Diagnosis Date  . Anemia    hx blood tx  . Benign essential HTN   . Cancer ([place])    skin cancer- basal cell on arm / colon 2011  . Chronic systolic CHF (congestive heart failure), NYHA class 2 ([place]) 01/2015  . Colon cancer ([place])    2011, s/p surgery, in remission  . Colon polyps   . Degenerative disk disease    Thoracic spine  . Depression   . 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 Generalized weakness 05/11/2015  . GERD (gastroesophageal reflux disease)   . Heart murmur   . hodgkins lymphoma   . Hypothyroidism   . Lymphoma ([place])   . Neuropathy due to chemotherapeutic drug ([place]) 06/19/2014  . NICM (nonischemic cardiomyopathy) ([place]) 01/2015  . Osteoarthritis    hands  . 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 PAF (paroxysmal atrial fibrillation) ([place])   . Peripheral vascular disease ([place])   . Pneumonia     PAST SURGICAL HISTORY: Past Surgical History:  Procedure Laterality Date  . BONE MARROW BIOPSY  05/27/13  . BREAST BIOPSY  1996  . CARDIAC CATHETERIZATION N/A 01/25/2015   Procedure: Left Heart Cath and Coronary Angiography;  Surgeon: Peter M Martinique, MD;  Location: [facility];  Service: Cardiovascular;  Laterality: N/A;  . CATARACT EXTRACTION, BILATERAL  2016  . CHOLECYSTECTOMY    . COLECTOMY  8/11  . COLONOSCOPY W/  BIOPSIES    . DG BIOPSY LUNG    . LYMPH NODE BIOPSY N/A 11/01/2014   Procedure: MEDIASTINAL LYMPH NODE BIOPSY;  Surgeon: Grace Isaac, MD;  Location: [facility];  Service: Thoracic;  Laterality: N/A;   . MEDIASTINOSCOPY N/A 11/01/2014   Procedure: MEDIASTINOSCOPY;  Surgeon: Grace Isaac, MD;  Location: [facility];  Service: Thoracic;  Laterality: N/A;  . PLANTAR FASCIA RELEASE    . port-a-cath insertion    . SPLENECTOMY     lymphoma  . TENDON RELEASE     Right thumb   . VAGINAL HYSTERECTOMY    . VENTRAL HERNIA REPAIR  1998  . VIDEO BRONCHOSCOPY WITH ENDOBRONCHIAL ULTRASOUND N/A 10/17/2014   Procedure: VIDEO BRONCHOSCOPY WITH ENDOBRONCHIAL ULTRASOUND;  Surgeon: Javier Glazier, MD;  Location: [facility];  Service: Thoracic;  Laterality: N/A;  . VIDEO BRONCHOSCOPY WITH ENDOBRONCHIAL ULTRASOUND N/A 11/01/2014   Procedure: VIDEO BRONCHOSCOPY WITH ENDOBRONCHIAL ULTRASOUND;  Surgeon: Grace Isaac, MD;  Location: [facility];  Service: Thoracic;  Laterality: N/A;    FAMILY HISTORY: Family History  Problem Relation Age of Onset  . Coronary artery disease Mother   . Diabetes Mother   . Fibromyalgia Daughter     chronic pain   . COPD Daughter   . Diabetes Brother   . Asthma Daughter   . Rheum arthritis Brother   . Clotting disorder Brother   . Alcohol abuse Sister   . Colon cancer Neg Hx   . Colon polyps Neg Hx   . Stomach cancer Neg Hx   . Rectal cancer Neg Hx   . Ulcerative colitis Neg Hx   . Crohn's disease Neg Hx     ADVANCED DIRECTIVES (Y/N):  @ADVDIR @  HEALTH MAINTENANCE: Social History  Substance Use Topics  . Smoking status: Never Smoker  . Smokeless tobacco: Never Used     Comment: Second-hand exposure through father.  . Alcohol use No     Colonoscopy:  PAP:  Bone density:  Lipid panel:  Allergies  Allergen Reactions  . Achromycin [Tetracycline Hcl] Other (See Comments)    Pt does not remember reaction  . Allopurinol Other (See Comments)    REACTION: Unsure of reaction happene years ago  . Astelin [Azelastine Hcl] Other (See Comments)    Reaction unknown  . Cephalexin Other (See Comments)    REACTION: unsure of reaction happened yrs ago.  . Codeine Other (See  Comments)    REACTION: abd. pain  . Lisinopril Cough  . Meloxicam Other (See Comments)    REACTION: GI symptoms  . Minocycline Other (See Comments)    Abdominal pain  . Nabumetone Other (See Comments)    REACTION: reaction not known  . Nyquil [Pseudoeph-Doxylamine-Dm-Apap] Hives  . Sulfa Antibiotics Other (See Comments)    Gi side eff   . Zolpidem Tartrate Other (See Comments)    REACTION: feels too drugged  . Buspar [Buspirone Hcl] Other (See Comments)    Dizziness, and not as effective for anxiety  . Ciprofloxacin Rash    Current Facility-Administered Medications  Medication Dose Route Frequency Provider Last Rate Last Dose  . acetaminophen (TYLENOL) tablet 650 mg  650 mg Oral Q6H PRN Alexis Hugelmeyer, DO   650 mg at 01/07/16 2106   Or  . acetaminophen (TYLENOL) suppository 650 mg  650 mg Rectal Q6H PRN Alexis Hugelmeyer, DO      . acidophilus (RISAQUAD) capsule 1 capsule  1 capsule Oral TID Alexis Hugelmeyer, DO   1 capsule at  01/07/16 2142  . acyclovir (ZOVIRAX) 200 MG capsule 400 mg  400 mg Oral Daily Alexis Hugelmeyer, DO   400 mg at 01/07/16 0943  . albuterol (PROVENTIL) (2.5 MG/3ML) 0.083% nebulizer solution 3 mL  3 mL Inhalation Q6H PRN Alexis Hugelmeyer, DO      . bisacodyl (DULCOLAX) EC tablet 5 mg  5 mg Oral Daily PRN Alexis Hugelmeyer, DO      . calcium-vitamin D (OSCAL WITH D) 500-200 MG-UNIT per tablet 1 tablet  1 tablet Oral QPM Alexis Hugelmeyer, DO   1 tablet at 01/07/16 1659  . cholecalciferol (VITAMIN D) tablet 1,000 Units  1,000 Units Oral Daily Alexis Hugelmeyer, DO   1,000 Units at 01/07/16 0943  . docusate sodium (COLACE) capsule 100 mg  100 mg Oral QHS Alexis Hugelmeyer, DO   100 mg at 01/07/16 2143  . estradiol (ESTRACE) tablet 1 mg  1 mg Oral Daily Alexis Hugelmeyer, DO   1 mg at 01/07/16 0943  . feeding supplement (BOOST / RESOURCE BREEZE) liquid 1 Container  1 Container Oral TID BM Epifanio Lesches, MD   1 Container at 01/07/16 1600  . FLUoxetine  (PROZAC) capsule 20 mg  20 mg Oral Daily Alexis Hugelmeyer, DO   20 mg at 01/07/16 0943  . fluticasone furoate-vilanterol (BREO ELLIPTA) 200-25 MCG/INH 1 puff  1 puff Inhalation QPM Alexis Hugelmeyer, DO   1 puff at 01/07/16 1659  . furosemide (LASIX) tablet 20 mg  20 mg Oral Daily Alexis Hugelmeyer, DO   20 mg at 01/07/16 0943  . gabapentin (NEURONTIN) capsule 300 mg  300 mg Oral BID Alexis Hugelmeyer, DO   300 mg at 01/07/16 2145  . levothyroxine (SYNTHROID, LEVOTHROID) tablet 112 mcg  112 mcg Oral QAC breakfast Alexis Hugelmeyer, DO   112 mcg at 01/07/16 0805  . lidocaine-prilocaine (EMLA) cream 1 application  1 application Topical PRN Alexis Hugelmeyer, DO      . loratadine (CLARITIN) tablet 10 mg  10 mg Oral Daily Alexis Hugelmeyer, DO   10 mg at 01/07/16 0943  . magnesium citrate solution 1 Bottle  1 Bottle Oral Once PRN Alexis Hugelmeyer, DO      . metoprolol (LOPRESSOR) tablet 50 mg  50 mg Oral BID Alexis Hugelmeyer, DO   50 mg at 01/07/16 2142  . omega-3 acid ethyl esters (LOVAZA) capsule 1 g  1 g Oral Daily Alexis Hugelmeyer, DO   1 g at 01/07/16 0943  . ondansetron (ZOFRAN) injection 4 mg  4 mg Intravenous Q6H PRN Alexis Hugelmeyer, DO      . ondansetron (ZOFRAN) tablet 8 mg  8 mg Oral Q6H PRN Alexis Hugelmeyer, DO      . pantoprazole (PROTONIX) EC tablet 40 mg  40 mg Oral Daily Alexis Hugelmeyer, DO   40 mg at 01/07/16 0943  . polyethylene glycol (MIRALAX / GLYCOLAX) packet 17 g  17 g Oral Daily PRN Alexis Hugelmeyer, DO      . prochlorperazine (COMPAZINE) tablet 10 mg  10 mg Oral Q6H PRN Alexis Hugelmeyer, DO      . senna-docusate (Senokot-S) tablet 1 tablet  1 tablet Oral QHS PRN Alexis Hugelmeyer, DO      . simvastatin (ZOCOR) tablet 40 mg  40 mg Oral QHS Alexis Hugelmeyer, DO   40 mg at 01/07/16 2145  . sodium chloride (OCEAN) 0.65 % nasal spray 1 spray  1 spray Each Nare PRN Epifanio Lesches, MD   1 spray at 01/07/16 1123   Facility-Administered Medications Ordered in Other  Encounters  Medication Dose Route Frequency Provider Last Rate Last Dose  . sodium chloride 0.9 % injection 10 mL  10 mL Intravenous PRN [name], MD   10 mL at 12/20/15 1436    OBJECTIVE: Vitals:   01/07/16 2035 01/07/16 2205  BP: 119/66   Pulse: 90   Resp: 18   Temp: (!) 102.5 F (39.2 C) 99.2 F (37.3 C)     Body mass index is 37.89 kg/m.    ECOG FS:1 - Symptomatic but completely ambulatory  General: Well-developed, well-nourished, no acute distress. Eyes: Pink conjunctiva, anicteric sclera. HEENT: Normocephalic, moist mucous membranes, clear oropharnyx. Lungs: Clear to auscultation bilaterally. Heart: Regular rate and rhythm. No rubs, murmurs, or gallops. Abdomen: Soft, nontender, nondistended. No organomegaly noted, normoactive bowel sounds. Musculoskeletal: No edema, cyanosis, or clubbing. Neuro: Alert, answering all questions appropriately. Cranial nerves grossly intact. Skin: No rashes or petechiae noted. Psych: Normal affect. Lymphatics: No cervical, calvicular, axillary or inguinal LAD.   LAB RESULTS:  Lab Results  Component Value Date   NA 133 (L) 01/07/2016   K 3.2 (L) 01/07/2016   CL 102 01/07/2016   CO2 23 01/07/2016   GLUCOSE 139 (H) 01/07/2016   BUN 17 01/07/2016   CREATININE 0.81 01/07/2016   CALCIUM 8.2 (L) 01/07/2016   PROT 6.8 01/01/2016   ALBUMIN 3.1 (L) 01/01/2016   AST 59 (H) 01/01/2016   ALT 24 01/01/2016   ALKPHOS 273 (H) 01/01/2016   BILITOT 1.0 01/01/2016   GFRNONAA >60 01/07/2016   GFRAA >60 01/07/2016    Lab Results  Component Value Date   WBC 9.2 01/07/2016   NEUTROABS 3.3 01/06/2016   HGB 10.2 (L) 01/07/2016   HCT 31.2 (L) 01/07/2016   MCV 94.1 01/07/2016   PLT 50 (L) 01/07/2016     STUDIES: Dg Chest 2 View  Result Date: 01/01/2016 CLINICAL DATA:  Intermittent fevers for 4 months.  Weakness today. EXAM: CHEST  2 VIEW COMPARISON:  PA and lateral chest 12/11/2015 and 07/17/2015. FINDINGS: Port-A-Cath is unchanged. Lungs  are clear. Heart size is upper normal. No pneumothorax or pleural effusion. Aortic atherosclerosis noted. IMPRESSION: No acute disease. Atherosclerosis. Electronically Signed   By: Inge Rise M.D.   On: 01/01/2016 18:49   Dg Chest 2 View  Result Date: 12/11/2015 CLINICAL DATA:  Shortness of breath. EXAM: CHEST  2 VIEW COMPARISON:  Radiographs of July 17, 2015. FINDINGS: The heart size and mediastinal contours are within normal limits. Both lungs are clear. No pneumothorax or pleural effusion is noted. Atherosclerosis of thoracic aorta is noted. Right internal jugular Port-A-Cath is unchanged in position with distal tip in expected position of the SVC. The visualized skeletal structures are unremarkable. IMPRESSION: No active cardiopulmonary disease.  Aortic atherosclerosis. Electronically Signed   By: Marijo Conception, M.D.   On: 12/11/2015 10:33   Ct Abdomen Pelvis W Contrast  Result Date: 12/11/2015 CLINICAL DATA:  75 year old female with diarrhea floor weeks. Fever. Increasing weakness. Rectal pain. History non-Hodgkin's lymphoma 1994, colon cancer 2011 and Hodgkin's disease 2015. Prior chemotherapy. Ongoing IVIG. Post cholecystectomy, splenectomy, hernia repair, colectomy and hysterectomy. Initial encounter. EXAM: CT ABDOMEN AND PELVIS WITH CONTRAST TECHNIQUE: Multidetector CT imaging of the abdomen and pelvis was performed using the standard protocol following bolus administration of intravenous contrast. CONTRAST:  16m ISOVUE-300 IOPAMIDOL (ISOVUE-300) INJECTION 61% COMPARISON:  07/10/2015 PET-CT. FINDINGS: Lower chest: Minimal scarring lung bases. No worrisome lesion. Coronary artery calcifications. Heart size within normal limits. Small epicardial lymph nodes. Hepatobiliary: Elongated  liver spanning over 20 cm. No worrisome hepatic lesion noted. Minimal periportal edema. Post cholecystectomy. Pancreas: No mass or inflammation. Spleen: Post splenectomy. Adrenals/Urinary Tract: No renal or  adrenal mass. No renal or ureteral obstructing stone. No hydronephrosis. Stomach/Bowel: Under distended stomach with diffuse wall thickening. This may represent result of under distension and only slightly different than 07/10/2015 exam. Not able to completely exclude underlying gastritis, mass or ulceration. Duodenal diverticulum incidentally noted. Large portion of colon has been resected. Residual colon within the anterior central aspect of the abdomen with thickened walls, new since 07/10/2015 exam and therefore not able to exclude underlying inflammation or mass. No extra luminal bowel inflammatory process, free fluid or free air. Vascular/Lymphatic: Increase number of normal size retroperitoneal lymph nodes. Calcified aorta without aneurysmal dilation. Reproductive: Post hysterectomy.  No ovarian mass. Other: Diastases rectus musculature with scar from abdominal hernia. Bowel extends immediately beneath the operative site but without bowel containing obstructing hernia. Slightly prominent vessels without clear collaterals noted. Slightly dense breast parenchyma incompletely assessed by CT. Partially decompressed bladder with circumferential wall thickening. Musculoskeletal: Probable hemangioma L2. Several lucencies most prominent at L3 and L4 suggestive of involvement by tumor, minimally changed from prior exam. Degenerative changes L3-4 and L4-5. Fusion L5-S1. IMPRESSION: Large portion of colon has been resected. Residual colon within the anterior central aspect of the abdomen with thickened walls, new since 07/10/2015 exam and therefore not able to exclude underlying inflammation or mass. No extra luminal bowel inflammatory process or free air. Under distended stomach with thickened walls minimally changed from prior exam and may be related to underdistention rather than mass or gastritis. Increased number of normal size retroperitoneal lymph nodes. The number of normal size lymph nodes has increased since  prior exam. Increase number lucencies involving the L3 and L4 vertebral body may represent progressive osseous metastatic disease. Post splenectomy, cholecystectomy and hysterectomy. Elongated liver without focal mass. Aortic atherosclerosis. Electronically Signed   By: Genia Del M.D.   On: 12/11/2015 13:02    ASSESSMENT: History of follicular lymphoma, epistaxis, thrombocytopenia.   PLAN:    1. Thrombocytopenia: Likely the etiology of her epistaxis. This is possibly related to recurrence/progression of her known follicular lymphoma. Recent platelet transfusion improved her white count to 50 also with improvement of her epistaxis. Continue to monitor daily CBC. Patient does not require bone marrow biopsy at this time. 2. Follicular lymphoma: CT scan results from December 11, 2015 reviewed independently and reported as above with suspicion of progression of disease. Patient states she has a PET scan scheduled in [place] on January 17, 2016 with follow-up with her primary oncologist on December 8. She has been instructed to keep these follow-up appointments as scheduled for further evaluation and consideration of placing patient back on maintenance treatment. 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 4. Anemia: Patient's hemoglobin is only mildly decreased from her baseline. Monitor. 5. History of C. difficile: Patient does not complain of diarrhea today, monitor. 6. Disposition: Okay to discharge once bleeding has resolved and platelets are stable. Patient has been instructed to keep her previously scheduled follow-up appointments in [facility] with her primary oncologist.  Appreciate consult, will follow.   Lloyd Huger, MD   01/07/2016 10:32 PM

## 2016-01-08 ENCOUNTER — Observation Stay: Payer: Medicare Other

## 2016-01-08 DIAGNOSIS — R011 Cardiac murmur, unspecified: Secondary | ICD-10-CM | POA: Diagnosis present

## 2016-01-08 DIAGNOSIS — E876 Hypokalemia: Secondary | ICD-10-CM | POA: Diagnosis present

## 2016-01-08 DIAGNOSIS — R109 Unspecified abdominal pain: Secondary | ICD-10-CM | POA: Diagnosis not present

## 2016-01-08 DIAGNOSIS — I110 Hypertensive heart disease with heart failure: Secondary | ICD-10-CM | POA: Diagnosis present

## 2016-01-08 DIAGNOSIS — I739 Peripheral vascular disease, unspecified: Secondary | ICD-10-CM | POA: Diagnosis present

## 2016-01-08 DIAGNOSIS — F329 Major depressive disorder, single episode, unspecified: Secondary | ICD-10-CM | POA: Diagnosis present

## 2016-01-08 DIAGNOSIS — D801 Nonfamilial hypogammaglobulinemia: Secondary | ICD-10-CM | POA: Diagnosis present

## 2016-01-08 DIAGNOSIS — I429 Cardiomyopathy, unspecified: Secondary | ICD-10-CM | POA: Diagnosis present

## 2016-01-08 DIAGNOSIS — A0471 Enterocolitis due to Clostridium difficile, recurrent: Secondary | ICD-10-CM | POA: Diagnosis present

## 2016-01-08 DIAGNOSIS — I480 Paroxysmal atrial fibrillation: Secondary | ICD-10-CM | POA: Diagnosis present

## 2016-01-08 DIAGNOSIS — R509 Fever, unspecified: Secondary | ICD-10-CM | POA: Diagnosis not present

## 2016-01-08 DIAGNOSIS — I5022 Chronic systolic (congestive) heart failure: Secondary | ICD-10-CM | POA: Diagnosis present

## 2016-01-08 DIAGNOSIS — D649 Anemia, unspecified: Secondary | ICD-10-CM | POA: Diagnosis present

## 2016-01-08 DIAGNOSIS — R040 Epistaxis: Secondary | ICD-10-CM | POA: Diagnosis present

## 2016-01-08 DIAGNOSIS — C8290 Follicular lymphoma, unspecified, unspecified site: Secondary | ICD-10-CM | POA: Diagnosis present

## 2016-01-08 DIAGNOSIS — R197 Diarrhea, unspecified: Secondary | ICD-10-CM | POA: Diagnosis not present

## 2016-01-08 DIAGNOSIS — E039 Hypothyroidism, unspecified: Secondary | ICD-10-CM | POA: Diagnosis present

## 2016-01-08 DIAGNOSIS — M79604 Pain in right leg: Secondary | ICD-10-CM | POA: Diagnosis not present

## 2016-01-08 DIAGNOSIS — A0472 Enterocolitis due to Clostridium difficile, not specified as recurrent: Secondary | ICD-10-CM | POA: Diagnosis not present

## 2016-01-08 DIAGNOSIS — D696 Thrombocytopenia, unspecified: Secondary | ICD-10-CM | POA: Diagnosis not present

## 2016-01-08 DIAGNOSIS — C8590 Non-Hodgkin lymphoma, unspecified, unspecified site: Secondary | ICD-10-CM | POA: Diagnosis not present

## 2016-01-08 DIAGNOSIS — D6959 Other secondary thrombocytopenia: Secondary | ICD-10-CM | POA: Diagnosis present

## 2016-01-08 DIAGNOSIS — K649 Unspecified hemorrhoids: Secondary | ICD-10-CM | POA: Diagnosis present

## 2016-01-08 DIAGNOSIS — Z7722 Contact with and (suspected) exposure to environmental tobacco smoke (acute) (chronic): Secondary | ICD-10-CM | POA: Diagnosis present

## 2016-01-08 DIAGNOSIS — J320 Chronic maxillary sinusitis: Secondary | ICD-10-CM | POA: Diagnosis present

## 2016-01-08 DIAGNOSIS — E871 Hypo-osmolality and hyponatremia: Secondary | ICD-10-CM | POA: Diagnosis present

## 2016-01-08 DIAGNOSIS — K219 Gastro-esophageal reflux disease without esophagitis: Secondary | ICD-10-CM | POA: Diagnosis present

## 2016-01-08 DIAGNOSIS — M79605 Pain in left leg: Secondary | ICD-10-CM | POA: Diagnosis not present

## 2016-01-08 DIAGNOSIS — Z9841 Cataract extraction status, right eye: Secondary | ICD-10-CM | POA: Diagnosis not present

## 2016-01-08 DIAGNOSIS — Z9081 Acquired absence of spleen: Secondary | ICD-10-CM | POA: Diagnosis not present

## 2016-01-08 DIAGNOSIS — Z9842 Cataract extraction status, left eye: Secondary | ICD-10-CM | POA: Diagnosis not present

## 2016-01-08 DIAGNOSIS — Z9049 Acquired absence of other specified parts of digestive tract: Secondary | ICD-10-CM | POA: Diagnosis not present

## 2016-01-08 LAB — URINALYSIS COMPLETE WITH MICROSCOPIC (ARMC ONLY)
BILIRUBIN URINE: NEGATIVE
GLUCOSE, UA: NEGATIVE mg/dL
KETONES UR: NEGATIVE mg/dL
NITRITE: NEGATIVE
Protein, ur: NEGATIVE mg/dL
SPECIFIC GRAVITY, URINE: 1.011 (ref 1.005–1.030)
pH: 6 (ref 5.0–8.0)

## 2016-01-08 LAB — CBC
HEMATOCRIT: 31.1 % — AB (ref 35.0–47.0)
Hemoglobin: 10.3 g/dL — ABNORMAL LOW (ref 12.0–16.0)
MCH: 31 pg (ref 26.0–34.0)
MCHC: 32.9 g/dL (ref 32.0–36.0)
MCV: 94.2 fL (ref 80.0–100.0)
Platelets: 18 10*3/uL — CL (ref 150–440)
RBC: 3.31 MIL/uL — ABNORMAL LOW (ref 3.80–5.20)
RDW: 20.7 % — AB (ref 11.5–14.5)
WBC: 9 10*3/uL (ref 3.6–11.0)

## 2016-01-08 LAB — C DIFFICILE QUICK SCREEN W PCR REFLEX
C Diff antigen: POSITIVE — AB
C Diff toxin: NEGATIVE

## 2016-01-08 LAB — PREPARE PLATELET PHERESIS: Unit division: 0

## 2016-01-08 LAB — BASIC METABOLIC PANEL
Anion gap: 7 (ref 5–15)
BUN: 14 mg/dL (ref 6–20)
CALCIUM: 8 mg/dL — AB (ref 8.9–10.3)
CO2: 23 mmol/L (ref 22–32)
CREATININE: 0.67 mg/dL (ref 0.44–1.00)
Chloride: 101 mmol/L (ref 101–111)
GFR calc Af Amer: >60 mL/min (ref >60)
GFR calc non Af Amer: >60 mL/min (ref >60)
GLUCOSE: 99 mg/dL (ref 65–99)
Potassium: 3.9 mmol/L (ref 3.5–5.1)
Sodium: 131 mmol/L — ABNORMAL LOW (ref 135–145)

## 2016-01-08 LAB — HEMOGLOBIN AND HEMATOCRIT, BLOOD
HEMATOCRIT: 31.8 % — AB (ref 35.0–47.0)
Hemoglobin: 10.7 g/dL — ABNORMAL LOW (ref 12.0–16.0)

## 2016-01-08 LAB — TYPE AND SCREEN
ABO/RH(D): A POS
ANTIBODY SCREEN: NEGATIVE
UNIT DIVISION: 0
Unit division: 0

## 2016-01-08 LAB — CLOSTRIDIUM DIFFICILE BY PCR: Toxigenic C. Difficile by PCR: POSITIVE — AB

## 2016-01-08 MED ORDER — VANCOMYCIN HCL IN DEXTROSE 1-5 GM/200ML-% IV SOLN
1000.0000 mg | INTRAVENOUS | Status: DC
  Administered 2016-01-08: 17:00:00 1000 mg via INTRAVENOUS
  Filled 2016-01-08 (×2): qty 200

## 2016-01-08 MED ORDER — VANCOMYCIN HCL IN DEXTROSE 1-5 GM/200ML-% IV SOLN
1000.0000 mg | INTRAVENOUS | Status: AC
  Administered 2016-01-08: 1000 mg via INTRAVENOUS
  Filled 2016-01-08: qty 200

## 2016-01-08 MED ORDER — PIPERACILLIN-TAZOBACTAM 3.375 G IVPB
3.3750 g | Freq: Three times a day (TID) | INTRAVENOUS | Status: DC
  Administered 2016-01-08 – 2016-01-09 (×3): 3.375 g via INTRAVENOUS
  Filled 2016-01-08 (×3): qty 50

## 2016-01-08 MED ORDER — SODIUM CHLORIDE 0.9 % IV SOLN
Freq: Once | INTRAVENOUS | Status: AC
  Administered 2016-01-08: 10:00:00 via INTRAVENOUS

## 2016-01-08 MED ORDER — VANCOMYCIN 50 MG/ML ORAL SOLUTION
125.0000 mg | Freq: Four times a day (QID) | ORAL | Status: DC
  Administered 2016-01-08 – 2016-01-10 (×7): 125 mg via ORAL
  Filled 2016-01-08 (×9): qty 2.5

## 2016-01-08 MED ORDER — ENSURE ENLIVE PO LIQD
237.0000 mL | Freq: Two times a day (BID) | ORAL | Status: DC
  Administered 2016-01-08 – 2016-01-13 (×10): 237 mL via ORAL

## 2016-01-08 NOTE — Progress Notes (Signed)
[facility] [place] NAME: Diamond Boyd    MR#:  [id number]  DATE OF BIRTH:  16-May-1940  SUBJECTIVE: Patient is having high fevers. denies cough, shortness of breath, abdominal pain, diarrhea. Patient had C. difficile colitis last month. Noted to have dark urine, some blood in the stool. Patient says she has hemorrhoids.   CHIEF COMPLAINT:   Chief Complaint  Patient presents with  . Epistaxis    REVIEW OF SYSTEMS:   ROS CONSTITUTIONAL:Complains of fever, fatigue. EYES: No blurred or double vision.  EARS, NOSE, AND THROAT: No tinnitus or ear pain.  RESPIRATORY: No cough, shortness of breath, wheezing or hemoptysis.  CARDIOVASCULAR: No chest pain, orthopnea, edema.  GASTROINTESTINAL: No nausea, vomiting, diarrhea or abdominal pain. His very poor by mouth intake, poor appetite  GENITOURINARY: No dysuria, hematuria.  ENDOCRINE: No polyuria, nocturia,  HEMATOLOGY: No anemia, easy bruising or bleeding SKIN: No rash or lesion. MUSCULOSKELETAL: No joint pain or arthritis.   NEUROLOGIC: No tingling, numbness, weakness.  PSYCHIATRY: No anxiety or depression.   DRUG ALLERGIES:   Allergies  Allergen Reactions  . Achromycin [Tetracycline Hcl] Other (See Comments)    Pt does not remember reaction  . Allopurinol Other (See Comments)    REACTION: Unsure of reaction happene years ago  . Astelin [Azelastine Hcl] Other (See Comments)    Reaction unknown  . Cephalexin Other (See Comments)    REACTION: unsure of reaction happened yrs ago.  . Codeine Other (See Comments)    REACTION: abd. pain  . Lisinopril Cough  . Meloxicam Other (See Comments)    REACTION: GI symptoms  . Minocycline Other (See Comments)    Abdominal pain  . Nabumetone Other (See Comments)    REACTION: reaction not known  . Nyquil [Pseudoeph-Doxylamine-Dm-Apap] Hives  . Sulfa Antibiotics Other (See Comments)    Gi side eff   . Zolpidem Tartrate Other (See  Comments)    REACTION: feels too drugged  . Buspar [Buspirone Hcl] Other (See Comments)    Dizziness, and not as effective for anxiety  . Ciprofloxacin Rash    VITALS:  Blood pressure (!) 143/54, pulse 86, temperature (!) 101.1 F (38.4 C), temperature source Oral, resp. rate 18, height '5\' 5"'$  (1.651 m), weight 103.3 kg (227 lb 11.2 oz), last menstrual period 02/10/1970, SpO2 96 %.  PHYSICAL EXAMINATION:  GENERAL:  75 y.o.-year-old patient lying in the bed with no acute distress.  EYES: Pupils equal, round, reactive to light and accommodation. No scleral icterus. Extraocular muscles intact.  HEENT: Head atraumatic, normocephalic. Oropharynx and nasopharynx clear.  NECK:  Supple, no jugular venous distention. No thyroid enlargement, no tenderness.  LUNGS: Normal breath sounds bilaterally, no wheezing, rales,rhonchi or crepitation. No use of accessory muscles of respiration.  CARDIOVASCULAR: S1, S2 normal. No murmurs, rubs, or gallops.  ABDOMEN: Soft, nontender, nondistended. Bowel sounds present. No organomegaly or mass.  EXTREMITIES: No pedal edema, cyanosis, or clubbing.  NEUROLOGIC: Cranial nerves II through XII are intact. Muscle strength 5/5 in all extremities. Sensation intact. Gait not checked.  PSYCHIATRIC: The patient is alert and oriented x 3.  SKIN: No obvious rash, lesion, or ulcer.    LABORATORY PANEL:   CBC  Recent Labs Lab 01/07/16 0628  WBC 9.2  HGB 10.2*  HCT 31.2*  PLT 50*   ------------------------------------------------------------------------------------------------------------------  Chemistries   Recent Labs Lab 01/01/16 1834  01/07/16 0628  NA 132*  < > 133*  K 3.8  < >  3.2*  CL 100*  < > 102  CO2 23  < > 23  GLUCOSE 97  < > 139*  BUN 16  < > 17  CREATININE 0.88  < > 0.81  CALCIUM 8.8*  < > 8.2*  AST 59*  --   --   ALT 24  --   --   ALKPHOS 273*  --   --   BILITOT 1.0  --   --   < > = values in this interval not  displayed. ------------------------------------------------------------------------------------------------------------------  Cardiac Enzymes No results for input(s): TROPONINI in the last 168 hours. ------------------------------------------------------------------------------------------------------------------  RADIOLOGY:  No results found.  EKG:   Orders placed or performed in visit on 12/18/15  . EKG 12-Lead   *Note: Due to a large number of results and/or encounters for the requested time period, some results have not been displayed. A complete set of results can be found in Results Review.    ASSESSMENT AND PLAN:   #1. fever in a patient with splenectomy, lymphoma:' Evaluated for sepsis including blood cultures, urine cultures, chest x-ray, ultrasound of extremities, stool for C. Difficile.; Start patient on empiric antibiotics including vancomycin, Rocephin while cultures are pending because of splenectomy and possible bacteremia with persistent fevers for almost one month.  2 hypokalemia replace the potassium, recheck her potassium  #3 thrombocytopenia causing epistaxis, recurrence oflymphoma: Patient has appointment with primary oncologist in December, seen by oncologist here, advised to keep the appointments and, IVIG monthly as per her routine scheduled recommended by oncologist.  #4 hypothyroidism: Continue Synthroid.  #5 chronic systolic heart failure: Stable no evidence of acute CHF at this time. Decreased IV fluids, stop her IV fluids. Encourage by mouth intake.   #6:boost breeze mouth 3 times a day is a a recommended by dietitian. Patient is requesting the ensure. D/w RN,PT.  All the records are reviewed and case discussed with Care Management/Social Workerr. Management plans discussed with the patient, family and they are in agreement.  CODE STATUS: full  TOTAL TIME TAKING CARE OF THIS PATIENT: 45 minutes.   POSSIBLE D/C IN 1-2 DAYS, DEPENDING ON CLINICAL  CONDITION.   Epifanio Lesches M.D on 01/08/2016 at 8:23 AM  Between 7am to 6pm - Pager - [phone]  After 6pm go to [email or web address] - password [email or web address] [facility]  Office  [phone]  CC: Primary care physician; [name], MD   Note: This dictation was prepared with Dragon dictation along with smaller phrase technology. Any transcriptional errors that result from this process are unintentional.

## 2016-01-08 NOTE — Progress Notes (Signed)
Pharmacy Antibiotic Note  Diamond Boyd is a 75 y.o. female with a h/o lymphoma and splenectomy admitted on 01/06/2016 with epistaxis and thrombocytopenia. Empiric abx ordered due to fever and history. Pharmacy has been consulted for vancomycin and Zosyn dosing.  Plan: Vancomycin 1000 IV every 18 hours with stacked dosing and a trough with the 5th total dose.  Goal trough 15-20 mcg/mL.  Zosyn 3.375 g EI q 8 hours.   Height: '5\' 5"'$  (165.1 cm) Weight: 227 lb 11.2 oz (103.3 kg) IBW/kg (Calculated) : 57  Temp (24hrs), Avg:99.4 F (37.4 C), Min:97.9 F (36.6 C), Max:102.5 F (39.2 C)   Recent Labs Lab 01/01/16 1834 01/01/16 1844 01/01/16 2243 01/06/16 2127 01/07/16 0628  WBC 8.0  --   --  11.4* 9.2  CREATININE 0.88  --   --  0.90 0.81  LATICACIDVEN  --  3.23* 1.93*  --   --     Estimated Creatinine Clearance: 70.4 mL/min (by C-G formula based on SCr of 0.81 mg/dL).    Allergies  Allergen Reactions  . Achromycin [Tetracycline Hcl] Other (See Comments)    Pt does not remember reaction  . Allopurinol Other (See Comments)    REACTION: Unsure of reaction happene years ago  . Astelin [Azelastine Hcl] Other (See Comments)    Reaction unknown  . Cephalexin Other (See Comments)    REACTION: unsure of reaction happened yrs ago.  . Codeine Other (See Comments)    REACTION: abd. pain  . Lisinopril Cough  . Meloxicam Other (See Comments)    REACTION: GI symptoms  . Minocycline Other (See Comments)    Abdominal pain  . Nabumetone Other (See Comments)    REACTION: reaction not known  . Nyquil [Pseudoeph-Doxylamine-Dm-Apap] Hives  . Sulfa Antibiotics Other (See Comments)    Gi side eff   . Zolpidem Tartrate Other (See Comments)    REACTION: feels too drugged  . Buspar [Buspirone Hcl] Other (See Comments)    Dizziness, and not as effective for anxiety  . Ciprofloxacin Rash    Antimicrobials this admission: Acyclovir  PTA  >>  vancomcyin 11/28 >>  Zosyn 11/28 >>  Dose  adjustments this admission:   Microbiology results: 11/27 BCx: NGTD UCx: sent  C diff: sent  Thank you for allowing pharmacy to be a part of this patient's care.  Ulice Dash D 01/08/2016 8:58 AM

## 2016-01-09 LAB — CBC
HCT: 30.4 % — ABNORMAL LOW (ref 35.0–47.0)
Hemoglobin: 10.2 g/dL — ABNORMAL LOW (ref 12.0–16.0)
MCH: 31.3 pg (ref 26.0–34.0)
MCHC: 33.4 g/dL (ref 32.0–36.0)
MCV: 93.7 fL (ref 80.0–100.0)
PLATELETS: 16 10*3/uL — AB (ref 150–440)
RBC: 3.25 MIL/uL — ABNORMAL LOW (ref 3.80–5.20)
RDW: 20.5 % — AB (ref 11.5–14.5)
WBC: 9.3 10*3/uL (ref 3.6–11.0)

## 2016-01-09 LAB — BASIC METABOLIC PANEL
Anion gap: 6 (ref 5–15)
BUN: 13 mg/dL (ref 6–20)
CHLORIDE: 102 mmol/L (ref 101–111)
CO2: 28 mmol/L (ref 22–32)
CREATININE: 0.82 mg/dL (ref 0.44–1.00)
Calcium: 8.3 mg/dL — ABNORMAL LOW (ref 8.9–10.3)
GFR calc Af Amer: >60 mL/min (ref >60)
GFR calc non Af Amer: >60 mL/min (ref >60)
GLUCOSE: 98 mg/dL (ref 65–99)
Potassium: 3.8 mmol/L (ref 3.5–5.1)
Sodium: 136 mmol/L (ref 135–145)

## 2016-01-09 LAB — URINE CULTURE

## 2016-01-09 LAB — PREPARE PLATELET PHERESIS: Unit division: 0

## 2016-01-09 NOTE — Progress Notes (Signed)
[facility] [place] NAME: Katlyn Muldrew    MR#:  [id number]  DATE OF BIRTH:  1940/08/18  SUBJECTIVE: Seen at bedside. Still has poor appetite, no other complaints. Stool C. difficile positive again.. No fever this morning   CHIEF COMPLAINT:   Chief Complaint  Patient presents with  . Epistaxis    REVIEW OF SYSTEMS:   ROS CONSTITUTIONAL: EYES: No blurred or double vision.  EARS, NOSE, AND THROAT: No tinnitus or ear pain.  RESPIRATORY: No cough, shortness of breath, wheezing or hemoptysis.  CARDIOVASCULAR: No chest pain, orthopnea, edema.  GASTROINTESTINAL: No nausea, vomiting, diarrhea or abdominal pain. His very poor by mouth intake, poor appetite  GENITOURINARY: No dysuria, hematuria.  ENDOCRINE: No polyuria, nocturia,  HEMATOLOGY: No anemia, easy bruising or bleeding SKIN: No rash or lesion. MUSCULOSKELETAL: No joint pain or arthritis.   NEUROLOGIC: No tingling, numbness, weakness.  PSYCHIATRY: No anxiety or depression.   DRUG ALLERGIES:   Allergies  Allergen Reactions  . Achromycin [Tetracycline Hcl] Other (See Comments)    Pt does not remember reaction  . Allopurinol Other (See Comments)    REACTION: Unsure of reaction happene years ago  . Astelin [Azelastine Hcl] Other (See Comments)    Reaction unknown  . Cephalexin Other (See Comments)    REACTION: unsure of reaction happened yrs ago.  . Codeine Other (See Comments)    REACTION: abd. pain  . Lisinopril Cough  . Meloxicam Other (See Comments)    REACTION: GI symptoms  . Minocycline Other (See Comments)    Abdominal pain  . Nabumetone Other (See Comments)    REACTION: reaction not known  . Nyquil [Pseudoeph-Doxylamine-Dm-Apap] Hives  . Sulfa Antibiotics Other (See Comments)    Gi side eff   . Zolpidem Tartrate Other (See Comments)    REACTION: feels too drugged  . Buspar [Buspirone Hcl] Other (See Comments)    Dizziness, and not as effective for anxiety   . Ciprofloxacin Rash    VITALS:  Blood pressure 134/72, pulse 77, temperature 97.7 F (36.5 C), temperature source Oral, resp. rate 16, height '5\' 5"'$  (1.651 m), weight 103.3 kg (227 lb 11.2 oz), last menstrual period 02/10/1970, SpO2 98 %.  PHYSICAL EXAMINATION:  GENERAL:  75 y.o.-year-old patient lying in the bed with no acute distress.  EYES: Pupils equal, round, reactive to light and accommodation. No scleral icterus. Extraocular muscles intact.  HEENT: Head atraumatic, normocephalic. Oropharynx and nasopharynx clear.  NECK:  Supple, no jugular venous distention. No thyroid enlargement, no tenderness.  LUNGS: Normal breath sounds bilaterally, no wheezing, rales,rhonchi or crepitation. No use of accessory muscles of respiration.  CARDIOVASCULAR: S1, S2 normal. No murmurs, rubs, or gallops.  ABDOMEN: Soft, nontender, nondistended. Bowel sounds present. No organomegaly or mass.  EXTREMITIES: No pedal edema, cyanosis, or clubbing.  NEUROLOGIC: Cranial nerves II through XII are intact. Muscle strength 5/5 in all extremities. Sensation intact. Gait not checked.  PSYCHIATRIC: The patient is alert and oriented x 3.  SKIN: No obvious rash, lesion, or ulcer.    LABORATORY PANEL:   CBC  Recent Labs Lab 01/09/16 0752  WBC 9.3  HGB 10.2*  HCT 30.4*  PLT PENDING   ------------------------------------------------------------------------------------------------------------------  Chemistries   Recent Labs Lab 01/09/16 0752  NA 136  K 3.8  CL 102  CO2 28  GLUCOSE 98  BUN 13  CREATININE 0.82  CALCIUM 8.3*   ------------------------------------------------------------------------------------------------------------------  Cardiac Enzymes No results for input(s): TROPONINI in the  last 168 hours. ------------------------------------------------------------------------------------------------------------------  RADIOLOGY:  Dg Chest 1 View  Result Date: 01/08/2016 CLINICAL  DATA:  Abdominal pain for several days, history of CHF and hypertension EXAM: CHEST 1 VIEW COMPARISON:  Chest x-ray of 01/01/2016 FINDINGS: No active infiltrate or effusion is seen. Mediastinal and hilar contours are unremarkable. A right-sided Port-A-Cath is present with the tip overlying the mid SVC. Mediastinal and hilar contours are unremarkable. The heart is borderline enlarged. No bony abnormality is seen. IMPRESSION: No active lung disease. Right-sided Port-A-Cath tip overlies the mid SVC. Electronically Signed   By: Ivar Drape M.D.   On: 01/08/2016 08:57   US Venous Img Lower Bilateral  Result Date: 01/08/2016 CLINICAL DATA:  Leg pain and swelling. EXAM: BILATERAL LOWER EXTREMITY VENOUS DOPPLER ULTRASOUND TECHNIQUE: Gray-scale sonography with graded compression, as well as color Doppler and duplex ultrasound were performed to evaluate the lower extremity deep venous systems from the level of the common femoral vein and including the common femoral, femoral, profunda femoral, popliteal and calf veins including the posterior tibial, peroneal and gastrocnemius veins when visible. The superficial great saphenous vein was also interrogated. Spectral Doppler was utilized to evaluate flow at rest and with distal augmentation maneuvers in the common femoral, femoral and popliteal veins. COMPARISON:  No recent prior. FINDINGS: RIGHT LOWER EXTREMITY Common Femoral Vein: No evidence of thrombus. Normal compressibility, respiratory phasicity and response to augmentation. Saphenofemoral Junction: No evidence of thrombus. Normal compressibility and flow on color Doppler imaging. Profunda Femoral Vein: No evidence of thrombus. Normal compressibility and flow on color Doppler imaging. Femoral Vein: No evidence of thrombus. Normal compressibility, respiratory phasicity and response to augmentation. Popliteal Vein: No evidence of thrombus. Normal compressibility, respiratory phasicity and response to augmentation. Calf  Veins: No evidence of thrombus. Normal compressibility and flow on color Doppler imaging. Superficial Great Saphenous Vein: No evidence of thrombus. Normal compressibility and flow on color Doppler imaging. Other Findings:  None. LEFT LOWER EXTREMITY Common Femoral Vein: No evidence of thrombus. Normal compressibility, respiratory phasicity and response to augmentation. Saphenofemoral Junction: No evidence of thrombus. Normal compressibility and flow on color Doppler imaging. Profunda Femoral Vein: No evidence of thrombus. Normal compressibility and flow on color Doppler imaging. Femoral Vein: No evidence of thrombus. Normal compressibility, respiratory phasicity and response to augmentation. Popliteal Vein: No evidence of thrombus. Normal compressibility, respiratory phasicity and response to augmentation. Calf Veins: No evidence of thrombus. Normal compressibility and flow on color Doppler imaging. Superficial Great Saphenous Vein: No evidence of thrombus. Normal compressibility and flow on color Doppler imaging. Other Findings:  None. IMPRESSION: No evidence of deep venous thrombosis. Electronically Signed   By: Marcello Moores  Register   On: 01/08/2016 11:12    EKG:   Orders placed or performed in visit on 12/18/15  . EKG 12-Lead   *Note: Due to a large number of results and/or encounters for the requested time period, some results have not been displayed. A complete set of results can be found in Results Review.    ASSESSMENT AND PLAN:   #1. 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 Blood cultures have been negative so far, urine cultures negative so far, chest x-ray no pneumonia. Continue to follow blood cultures, urine cultures for 48 hours to make sure there is no infection before we discharged the patient. But we can discontinue vancomycin, Zosyn because patient has no further fevers, no WBC.  Patient ultrasound of the legs  did not show any blood clot. Findings are discussed with patient and family.  2 hypokalemia ; resolved.  #3 thrombocytopenia causing epistaxis, recurrence oflymphoma: Patient has appointment with primary oncologist in December, seen by oncologist here, advised to keep the appointments and, IVIG monthly as per her routine scheduled recommended by oncologist.  platelets were 18 yesterday, ordered platelets 1 bag yesterday. No further evidence of  Major epistaxis or rectal bleeding.  #4 hypothyroidism: Continue Synthroid.  #5 chronic systolic heart failure: Stable no evidence of acute CHF at this time. Decreased IV fluids, stop her IV fluids. Encourage by mouth intake.   #6:boost breeze mouth 3 times a day is a a recommended by dietitian. Patient is requesting the ensure. D/w RN,PT. #7 recurrent C. difficile colitis: Continue by mouth vancomycin, GI consult for possible stool transplant.  All the records are reviewed and case discussed with Care Management/Social Workerr. Management plans discussed with the patient, family and they are in agreement.  CODE STATUS: full  TOTAL TIME TAKING CARE OF THIS PATIENT: 45 minutes.   POSSIBLE D/C IN 1-2 DAYS, DEPENDING ON CLINICAL CONDITION.   Epifanio Lesches M.D on 01/09/2016 at 9:23 AM  Between 7am to 6pm - Pager - [phone]  After 6pm go to [email or web address] - password [email or web address] [facility]  Office  [phone]  CC: Primary care physician; [name], MD   Note: This dictation was prepared with Dragon dictation along with smaller phrase technology. Any transcriptional errors that result from this process are unintentional.

## 2016-01-09 NOTE — Evaluation (Signed)
Physical Therapy Evaluation Patient Details Name: Diamond Boyd MRN: [id number] DOB: 1940/03/04 Today's Date: 01/09/2016   History of Present Illness  presented to ER secondary to recurrent epistaxis; admitted with thrombocytopenia, possibly recurrent lymphoma.  Platelets increased to 50 after transfusion on 11/26; however, decreased to 16 at this time (no plans for additional transfusion unless continued decline to 10 per primary RN)  Clinical Impression  Upon evaluation, patient alert and oriented; follows commands and demonstrates good insight.  Eager for [place] activities as able.  Demonstrates strength and ROM grossly symmetrical and WFL throughout all extremities; no focal weakness or sensory deficit as needed.  Able to complete bed mobility with mod indep; sit/stand, basic transfers and gait (30-60') with cga/close sup; optimal safety/indep with use of RW (due to minor sway/LOB without assist device).  Recommend continued use of RW with all mobility at this time to prevent fall (especially in light of low platelet levels).  Patient voiced understanding/agreement. Would benefit from skilled PT to address above deficits and promote optimal return to PLOF; Recommend transition to [place] upon discharge from acute hospitalization.     Follow Up Recommendations Home health PT    Equipment Recommendations  Rolling walker with 5" wheels    Recommendations for Other Services       Precautions / Restrictions Precautions Precautions: Fall Precaution Comments: Enteric Restrictions Weight Bearing Restrictions: No      Mobility  Bed Mobility Overal bed mobility: Modified Independent Bed Mobility: Supine to Sit           General bed mobility comments: use of bedrails to complete movement transition  Transfers Overall transfer level: Needs assistance Equipment used: None Transfers: Sit to/from Stand Sit to Stand: Supervision         General transfer comment: broad BOS; fair LE  power, limited use of UEs during movement transition  Ambulation/Gait Ambulation/Gait assistance: Min guard Ambulation Distance (Feet): 30 Feet Assistive device: None       General Gait Details: single LOB with turn negotiation requiring cga/min assist from therapist for correction; intermittently reaching for furniture for external stabilization as needed  Stairs            Wheelchair Mobility    Modified Rankin (Stroke Patients Only)       Balance Overall balance assessment: Needs assistance Sitting-balance support: No upper extremity supported;Feet supported Sitting balance-Leahy Scale: Good     Standing balance support: No upper extremity supported Standing balance-Leahy Scale: Fair                               Pertinent Vitals/Pain Pain Assessment: No/denies pain    Home Living Family/patient expects to be discharged to:: Private residence Living Arrangements: Spouse/significant other Available Help at Discharge: Family Type of Home: House Home Access: Stairs to enter   Technical brewer of Steps: 3 Home Layout: One level Home Equipment: Environmental consultant - 2 wheels;Cane - single point      Prior Function Level of Independence: Independent         Comments: Indep with household mobility; husband assists with ADLs as needed and "walks with me" when needed.  Denies fall history.     Hand Dominance        Extremity/Trunk Assessment   Upper Extremity Assessment: Overall WFL for tasks assessed           Lower Extremity Assessment: Overall WFL for tasks assessed (grossly 4-/5 throughout)  Communication   Communication: No difficulties  Cognition Arousal/Alertness: Awake/alert Behavior During Therapy: WFL for tasks assessed/performed Overall Cognitive Status: Within Functional Limits for tasks assessed                      General Comments      Exercises Other Exercises Other Exercises: 63' with RW, close  sup--improved safety/stability with use of RW.  Recommend continued use of RW at this time for optimal safety/indep and decreased fall risk (esp in light of low platelet levels)   Assessment/Plan    PT Assessment Patient needs continued PT services  PT Problem List Decreased strength;Decreased activity tolerance;Decreased mobility;Decreased balance          PT Treatment Interventions DME instruction;Gait training;Functional mobility training;Therapeutic activities;Therapeutic exercise;Patient/family education;Stair training;Balance training    PT Goals (Current goals can be found in the Care Plan section)  Acute Rehab PT Goals Patient Stated Goal: to return home with husband PT Goal Formulation: With patient Time For Goal Achievement: 01/23/16 Potential to Achieve Goals: Good    Frequency Min 2X/week   Barriers to discharge Decreased caregiver support      Co-evaluation               End of Session Equipment Utilized During Treatment: Gait belt Activity Tolerance: Patient tolerated treatment well Patient left: in chair;with call bell/phone within reach;with chair alarm set Nurse Communication: Mobility status         Time: 2130-8657 PT Time Calculation (min) (ACUTE ONLY): 15 min   Charges:   PT Evaluation $PT Eval Low Complexity: 1 Procedure PT Treatments $Gait Training: 8-22 mins   PT G Codes:        Delphine Sizemore H. Owens Shark, [place], DPT, NCS 01/09/16, 4:17 PM [phone]

## 2016-01-09 NOTE — Progress Notes (Signed)
[facility]  Patient Status: Active  [facility] is providing the following services: PT  If patient discharges after hours, please call [phone].   Diamond Boyd 01/09/2016, 8:08 AM

## 2016-01-09 NOTE — Care Management Important Message (Signed)
Important Message  Patient Details  Name: Diamond Boyd MRN: [id number] Date of Birth: 12/29/1940   Medicare Important Message Given:  Yes    Shelbie Ammons, RN 01/09/2016, 10:27 AM

## 2016-01-09 NOTE — Care Management (Signed)
Admitted to [facility] with the diagnosis of thrombocytopenia. Discharged from [facility] 12/11/15. Lives with husband, Elenore Rota [phone]). Followed by [facility] for physical therapy since 12/18/15. No skilled facility. No home oxygen. No Life Alert. Cane, rolling walker, bedside commode, and shower chair in the home. Husband helps with baths and dressing. Self feeds. Can drive, if needed. Prescriptions are filled at [facility] on [address]. No falls. Appetite is better. Lost 20 pounds in 3 months. Husband will transport. Shelbie Ammons RN MSN CCM Care Management

## 2016-01-10 LAB — CBC
HEMATOCRIT: 28 % — AB (ref 35.0–47.0)
Hemoglobin: 9.2 g/dL — ABNORMAL LOW (ref 12.0–16.0)
MCH: 31 pg (ref 26.0–34.0)
MCHC: 32.9 g/dL (ref 32.0–36.0)
MCV: 94 fL (ref 80.0–100.0)
Platelets: 14 10*3/uL — CL (ref 150–440)
RBC: 2.98 MIL/uL — AB (ref 3.80–5.20)
RDW: 20.7 % — ABNORMAL HIGH (ref 11.5–14.5)
WBC: 8.3 10*3/uL (ref 3.6–11.0)

## 2016-01-10 LAB — PATHOLOGIST SMEAR REVIEW

## 2016-01-10 MED ORDER — VANCOMYCIN 50 MG/ML ORAL SOLUTION
500.0000 mg | Freq: Four times a day (QID) | ORAL | Status: DC
  Administered 2016-01-10 – 2016-01-13 (×13): 500 mg via ORAL
  Filled 2016-01-10 (×15): qty 10

## 2016-01-10 NOTE — Progress Notes (Signed)
Physical Therapy Treatment Patient Details Name: Diamond Boyd MRN: [id number] DOB: [birth year] Today's Date: 01/10/2016    History of Present Illness presented to ER secondary to recurrent epistaxis; admitted with thrombocytopenia, possibly recurrent lymphoma.  Platelets increased to 50 after transfusion on 11/26; however, decreased to 16 at this time (no plans for additional transfusion unless continued decline to 10 per primary RN)    PT Comments    Pt bed ready to participate in session.  Participated in exercises as described below.  Bed mobility with increased time but with ease.  Ambulated in room with rolling walker for safety due to decreased platelets and HGB.  PT with steady gait without loss of balance today.  Pt tolerated well but did fatigue with standing exercises.   Follow Up Recommendations  Home health PT     Equipment Recommendations  Rolling walker with 5" wheels    Recommendations for Other Services       Precautions / Restrictions Precautions Precautions: Fall Precaution Comments: Enteric Restrictions Weight Bearing Restrictions: No    Mobility  Bed Mobility Overal bed mobility: Modified Independent Bed Mobility: Supine to Sit     Supine to sit: Supervision        Transfers Overall transfer level: Needs assistance Equipment used: Rolling walker (2 wheeled) Transfers: Sit to/from Stand Sit to Stand: Supervision            Ambulation/Gait Ambulation/Gait assistance: Min guard Ambulation Distance (Feet): 60 Feet (in room with multiple turns without lob) Assistive device: Rolling walker (2 wheeled) Gait Pattern/deviations: Step-through pattern   Gait velocity interpretation: Below normal speed for age/gender     Stairs            Wheelchair Mobility    Modified Rankin (Stroke Patients Only)       Balance Overall balance assessment: Needs assistance Sitting-balance support: Feet supported Sitting balance-Leahy Scale:  Good     Standing balance support: No upper extremity supported Standing balance-Leahy Scale: Fair                      Cognition Arousal/Alertness: Awake/alert Behavior During Therapy: WFL for tasks assessed/performed Overall Cognitive Status: Within Functional Limits for tasks assessed                      Exercises Other Exercises Other Exercises: unsupported sitting exercises for ankle pumps, LAQ and marches x 10 bilaterally Other Exercises: standing exercises with walker x 10 bialterally marches, SLR, and toe raises    General Comments        Pertinent Vitals/Pain Pain Assessment: No/denies pain    Home Living                      Prior Function            PT Goals (current goals can now be found in the care plan section) Progress towards PT goals: Progressing toward goals    Frequency    Min 2X/week      PT Plan Current plan remains appropriate    Co-evaluation             End of Session Equipment Utilized During Treatment: Gait belt Activity Tolerance: Patient tolerated treatment well Patient left: in chair;with call bell/phone within reach;with chair alarm set     Time: 4166-0630 PT Time Calculation (min) (ACUTE ONLY): 24 min  Charges:  $Gait Training: 8-22 mins $Therapeutic Exercise: 8-22 mins  G Codes:      Chesley Noon 01/10/2016, 12:21 PM

## 2016-01-10 NOTE — Progress Notes (Signed)
Nutrition Follow-up  DOCUMENTATION CODES:   Obesity unspecified  INTERVENTION:  Discontinued Boost Breeze.  Continue Ensure Enlive po BID, each supplement provides 350 kcal and 20 grams of protein.  Encouraged patient to have adequate calories and protein with meals. Reviewed other protein options patient may enjoy (yogurt, peanut butter) if she does not feel like having meat at her meal.   NUTRITION DIAGNOSIS:   Inadequate oral intake related to poor appetite, other (see comment) (difficulty chewing) as evidenced by per patient/family report.  Ongoing.  GOAL:   Patient will meet greater than or equal to 90% of their needs  Progressing.  MONITOR:   PO intake, Supplement acceptance, Labs, Weight trends, I & O's  REASON FOR ASSESSMENT:   Malnutrition Screening Tool    ASSESSMENT:   75 y.o. female with a known history of Lymphoma, chronic systolic CHF, colon cancer, hypothyroidism and anemia presents to the emergency department complaining of epistaxis for which she received Afrin by EMS. The patient states she has been having nosebleeds on and off for a few weeks. Patient was seen recently for intermittent fevers and was told that her platelet count was low and possibly suggestive of a recurrence of lymphoma.  -Patient found to have recurrence of lymphoma - plan to follow-up with Oncologist in December.  Spoke with patient and husband at bedside. Appetite remains poor but patient reports less difficulty with chewing than on initial assessment. Persistent diarrhea. Patient reports she did not like the Boost Breeze so she is now amenable to drinking strawberry Ensure Enlive (on initial assessment had refused Ensure). She drank one yesterday. Encouraged to increase to 2 so she can better meet her protein and calorie needs - patient amenable.  Meal Completion: 50% per patient and husband - not recorded in chart. In the past 24 hours patient has had approximately 1104 kcal (69%  minimum estimated kcal needs) and 59 grams protein (79% minimum estimated protein needs).   Medications reviewed and include: acidophilus 1 capsul TID, Oscal with D, Vitamin D 1000 units daily, estradiol, Lasix 20 mg daily, levothyroxine, Lovaza 1 gram daily, pantoprazole, vancomycin.  Labs reviewed: RBC 3.25, Hgb 10.2, Platelets 16.   Even though patient had significant weight loss of 8% body weight in 2 months, not able to diagnose malnutrition as her intake is meeting >50% estimated energy needs. May have been worse prior to intervention, but patient was unable to provide enough information.  Discussed with RN.   Diet Order:  Diet Heart Room service appropriate? Yes; Fluid consistency: Thin  Skin:  Reviewed, no issues  Last BM:  01/10/2016  Height:   Ht Readings from Last 1 Encounters:  01/07/16 '5\' 5"'$  (1.651 m)    Weight:   Wt Readings from Last 1 Encounters:  01/07/16 227 lb 11.2 oz (103.3 kg)    Ideal Body Weight:  56.82 kg  BMI:  Body mass index is 37.89 kg/m.  Estimated Nutritional Needs:   Kcal:  1800-2000 (MSJ x 1.2-1.3)  Protein:  100-125 grams (1-1.2 grams/kg)  Fluid:  >/= 1.8 L/day  EDUCATION NEEDS:   No education needs identified at this time  Willey Blade, MS, RD, LDN Pager: [phone] After Hours Pager: [phone]

## 2016-01-10 NOTE — Progress Notes (Signed)
CBC results discussed with Dr. Vianne Bulls information acknowledged and no orders obtained at this time.

## 2016-01-10 NOTE — Progress Notes (Signed)
[facility] at [place] NAME: Alleen Kehm    MR#:  [id number]  DATE OF BIRTH:  1940/12/21  Patient had diarrhea this morning, last night. Also had some epistaxis, blood on wiping down after BM.   CHIEF COMPLAINT:   Chief Complaint  Patient presents with  . Epistaxis    REVIEW OF SYSTEMS:   ROS CONSTITUTIONAL: EYES: No blurred or double vision.  EARS, NOSE, AND THROAT: No tinnitus or ear pain.  RESPIRATORY: No cough, shortness of breath, wheezing or hemoptysis.  CARDIOVASCULAR: No chest pain, orthopnea, edema.  GASTROINTESTINAL: No nausea, vomiting, diarrhea or abdominal pain. His very poor by mouth intake, poor appetite  GENITOURINARY: No dysuria, hematuria.  ENDOCRINE: No polyuria, nocturia,  HEMATOLOGY: No anemia, easy bruising or bleeding SKIN: No rash or lesion. MUSCULOSKELETAL: No joint pain or arthritis.   NEUROLOGIC: No tingling, numbness, weakness.  PSYCHIATRY: No anxiety or depression.   DRUG ALLERGIES:   Allergies  Allergen Reactions  . Achromycin [Tetracycline Hcl] Other (See Comments)    Pt does not remember reaction  . Allopurinol Other (See Comments)    REACTION: Unsure of reaction happene years ago  . Astelin [Azelastine Hcl] Other (See Comments)    Reaction unknown  . Cephalexin Other (See Comments)    REACTION: unsure of reaction happened yrs ago.  . Codeine Other (See Comments)    REACTION: abd. pain  . Lisinopril Cough  . Meloxicam Other (See Comments)    REACTION: GI symptoms  . Minocycline Other (See Comments)    Abdominal pain  . Nabumetone Other (See Comments)    REACTION: reaction not known  . Nyquil [Pseudoeph-Doxylamine-Dm-Apap] Hives  . Sulfa Antibiotics Other (See Comments)    Gi side eff   . Zolpidem Tartrate Other (See Comments)    REACTION: feels too drugged  . Buspar [Buspirone Hcl] Other (See Comments)    Dizziness, and not as effective for anxiety  . Ciprofloxacin Rash     VITALS:  Blood pressure (!) 122/56, pulse 80, temperature 98.3 F (36.8 C), temperature source Oral, resp. rate 16, height '5\' 5"'$  (1.651 m), weight 103.3 kg (227 lb 11.2 oz), last menstrual period 02/10/1970, SpO2 96 %.  PHYSICAL EXAMINATION:  GENERAL:  75 y.o.-year-old patient lying in the bed with no acute distress.  EYES: Pupils equal, round, reactive to light and accommodation. No scleral icterus. Extraocular muscles intact.  HEENT: Head atraumatic, normocephalic. Oropharynx and nasopharynx clear.  NECK:  Supple, no jugular venous distention. No thyroid enlargement, no tenderness.  LUNGS: Normal breath sounds bilaterally, no wheezing, rales,rhonchi or crepitation. No use of accessory muscles of respiration.  CARDIOVASCULAR: S1, S2 normal. No murmurs, rubs, or gallops.  ABDOMEN: Soft, nontender, nondistended. Bowel sounds present. No organomegaly or mass.  EXTREMITIES: No pedal edema, cyanosis, or clubbing.  NEUROLOGIC: Cranial nerves II through XII are intact. Muscle strength 5/5 in all extremities. Sensation intact. Gait not checked.  PSYCHIATRIC: The patient is alert and oriented x 3.  SKIN: No obvious rash, lesion, or ulcer.    LABORATORY PANEL:   CBC  Recent Labs Lab 01/09/16 0752  WBC 9.3  HGB 10.2*  HCT 30.4*  PLT 16*   ------------------------------------------------------------------------------------------------------------------  Chemistries   Recent Labs Lab 01/09/16 0752  NA 136  K 3.8  CL 102  CO2 28  GLUCOSE 98  BUN 13  CREATININE 0.82  CALCIUM 8.3*   ------------------------------------------------------------------------------------------------------------------  Cardiac Enzymes No results for input(s): TROPONINI in the last 168  hours. ------------------------------------------------------------------------------------------------------------------  RADIOLOGY:  US Venous Img Lower Bilateral  Result Date: 01/08/2016 CLINICAL DATA:  Leg  pain and swelling. EXAM: BILATERAL LOWER EXTREMITY VENOUS DOPPLER ULTRASOUND TECHNIQUE: Gray-scale sonography with graded compression, as well as color Doppler and duplex ultrasound were performed to evaluate the lower extremity deep venous systems from the level of the common femoral vein and including the common femoral, femoral, profunda femoral, popliteal and calf veins including the posterior tibial, peroneal and gastrocnemius veins when visible. The superficial great saphenous vein was also interrogated. Spectral Doppler was utilized to evaluate flow at rest and with distal augmentation maneuvers in the common femoral, femoral and popliteal veins. COMPARISON:  No recent prior. FINDINGS: RIGHT LOWER EXTREMITY Common Femoral Vein: No evidence of thrombus. Normal compressibility, respiratory phasicity and response to augmentation. Saphenofemoral Junction: No evidence of thrombus. Normal compressibility and flow on color Doppler imaging. Profunda Femoral Vein: No evidence of thrombus. Normal compressibility and flow on color Doppler imaging. Femoral Vein: No evidence of thrombus. Normal compressibility, respiratory phasicity and response to augmentation. Popliteal Vein: No evidence of thrombus. Normal compressibility, respiratory phasicity and response to augmentation. Calf Veins: No evidence of thrombus. Normal compressibility and flow on color Doppler imaging. Superficial Great Saphenous Vein: No evidence of thrombus. Normal compressibility and flow on color Doppler imaging. Other Findings:  None. LEFT LOWER EXTREMITY Common Femoral Vein: No evidence of thrombus. Normal compressibility, respiratory phasicity and response to augmentation. Saphenofemoral Junction: No evidence of thrombus. Normal compressibility and flow on color Doppler imaging. Profunda Femoral Vein: No evidence of thrombus. Normal compressibility and flow on color Doppler imaging. Femoral Vein: No evidence of thrombus. Normal compressibility,  respiratory phasicity and response to augmentation. Popliteal Vein: No evidence of thrombus. Normal compressibility, respiratory phasicity and response to augmentation. Calf Veins: No evidence of thrombus. Normal compressibility and flow on color Doppler imaging. Superficial Great Saphenous Vein: No evidence of thrombus. Normal compressibility and flow on color Doppler imaging. Other Findings:  None. IMPRESSION: No evidence of deep venous thrombosis. Electronically Signed   By: Marcello Moores  Register   On: 01/08/2016 11:12    EKG:   Orders placed or performed in visit on 12/18/15  . EKG 12-Lead   *Note: Due to a large number of results and/or encounters for the requested time period, some results have not been displayed. A complete set of results can be found in Results Review.    ASSESSMENT AND PLAN:   #1. Fever secondary to C. difficile colitis: Started on vancomycin, did increase the dose today because of persistent diarrhea., Recurrent C. difficile in 1 month. Patient says that he she feels better, no more chills.   2 hypokalemia ; resolved.  #3 thrombocytopenia causing epistaxis, recurrence oflymphoma: Patient has appointment with primary oncologist in December, seen by oncologist here, advised to keep the appointments and, IVIG monthly as per her routine scheduled recommended by oncologist.  Check CBC today came in because of fall epistaxis: And see if she needs transfusion of platelets.  #4 hypothyroidism: Continue Synthroid.  #5 chronic systolic heart failure: Stable no evidence of acute CHF at this time. Decreased IV fluids, stop her IV fluids. Encourage by mouth intake.   #6:boost breeze mouth 3 times a day is a a recommended by dietitian. Patient is requesting the ensure. D/w RN,PT. #7 recurrent C. difficile colitis: Continue by mouth vancomycin, GI consult for possible stool transplant. #8. physical therapy recommended home health physical therapy  All the records are reviewed and  case discussed with Care  Management/Social Workerr. Management plans discussed with the patient, family and they are in agreement.  CODE STATUS: full  TOTAL TIME TAKING CARE OF THIS PATIENT: 45 minutes.   POSSIBLE D/C IN 1-2 DAYS, DEPENDING ON CLINICAL CONDITION.   Epifanio Lesches M.D on 01/10/2016 at 9:14 AM  Between 7am to 6pm - Pager - [phone]  After 6pm go to [email or web address] - password [email or web address] [facility]  Office  [phone]  CC: Primary care physician; [name], MD   Note: This dictation was prepared with Dragon dictation along with smaller phrase technology. Any transcriptional errors that result from this process are unintentional.

## 2016-01-11 NOTE — Progress Notes (Signed)
Physical Therapy Treatment Patient Details Name: Diamond Boyd MRN: [id number] DOB: 1941/02/10 Today's Date: 01/11/2016    History of Present Illness presented to ER secondary to recurrent epistaxis; admitted with thrombocytopenia, possibly recurrent lymphoma.  Platelets increased to 50 after transfusion on 11/26; however, decreased to 16 at this time (no plans for additional transfusion unless continued decline to 10 per primary RN)    PT Comments    Supine to sit with ease and without assist today.  She was able to ambulate in and out of the bathroom without assistive device with steady gait.  Some blood noted when wiping which pt states is normal for her.  She continued to ambulate in room without device or difficulty.  Participated in exercises as described below.  She continues to fatigue quickly and takes self initiated rest breaks as needed.   Follow Up Recommendations  Home health PT     Equipment Recommendations  Rolling walker with 5" wheels    Recommendations for Other Services       Precautions / Restrictions Precautions Precautions: Fall Precaution Comments: Enteric Restrictions Weight Bearing Restrictions: No    Mobility  Bed Mobility Overal bed mobility: Modified Independent       Supine to sit: Modified independent (Device/Increase time) Sit to supine: Modified independent (Device/Increase time)      Transfers Overall transfer level: Modified independent Equipment used: None Transfers: Sit to/from Stand Sit to Stand: Modified independent (Device/Increase time)         General transfer comment: good safety  Ambulation/Gait Ambulation/Gait assistance: Supervision Ambulation Distance (Feet): 60 Feet (50) Assistive device: Rolling walker (2 wheeled) Gait Pattern/deviations: Step-through pattern   Gait velocity interpretation: Below normal speed for age/gender General Gait Details: generally safe with or without AD   Stairs             Wheelchair Mobility    Modified Rankin (Stroke Patients Only)       Balance Overall balance assessment: Modified Independent Sitting-balance support: Feet supported Sitting balance-Leahy Scale: Good     Standing balance support: No upper extremity supported Standing balance-Leahy Scale: Fair                      Cognition Arousal/Alertness: Awake/alert Behavior During Therapy: WFL for tasks assessed/performed Overall Cognitive Status: Within Functional Limits for tasks assessed                      Exercises Other Exercises Other Exercises: standing exercises with BUE support for heel raises, marches, SLR 2 x 10 Other Exercises: ambualtion to/from bathroom    General Comments        Pertinent Vitals/Pain Pain Assessment: No/denies pain    Home Living                      Prior Function            PT Goals (current goals can now be found in the care plan section) Progress towards PT goals: Progressing toward goals    Frequency    Min 2X/week      PT Plan Current plan remains appropriate    Co-evaluation             End of Session Equipment Utilized During Treatment: Gait belt Activity Tolerance: Patient tolerated treatment well Patient left: in chair;with call bell/phone within reach;with chair alarm set     Time: 1517-6160 PT Time Calculation (min) (ACUTE ONLY): 23 min  Charges:  $Gait Training: 8-22 mins $Therapeutic Exercise: 8-22 mins                    G Codes:      Diamond Boyd 2016-01-28, 12:54 PM

## 2016-01-11 NOTE — Consult Note (Signed)
Patient with C. Diff colitis.  Will do consult Saturday morning, patient on oral vancomycin, usual dose is '250mg'$  qid.

## 2016-01-11 NOTE — Progress Notes (Signed)
[facility] at [place] NAME: Diamond Boyd    MR#:  [id number]  DATE OF BIRTH:  07-May-1940  Patient is seen at bedside, feels slightly better but f poor by mouth intake /semiformed  stool this morning.   CHIEF COMPLAINT:   Chief Complaint  Patient presents with  . Epistaxis    REVIEW OF SYSTEMS:   ROS CONSTITUTIONAL: EYES: No blurred or double vision.  EARS, NOSE, AND THROAT: No tinnitus or ear pain.  RESPIRATORY: No cough, shortness of breath, wheezing or hemoptysis.  CARDIOVASCULAR: No chest pain, orthopnea, edema.  GASTROINTESTINAL: No nausea, vomiting, diarrhea or abdominal pain. His very poor by mouth intake, poor appetite  GENITOURINARY: No dysuria, hematuria.  ENDOCRINE: No polyuria, nocturia,  HEMATOLOGY: No anemia, easy bruising or bleeding SKIN: No rash or lesion. MUSCULOSKELETAL: No joint pain or arthritis.   NEUROLOGIC: No tingling, numbness, weakness.  PSYCHIATRY: No anxiety or depression.   DRUG ALLERGIES:   Allergies  Allergen Reactions  . Achromycin [Tetracycline Hcl] Other (See Comments)    Pt does not remember reaction  . Allopurinol Other (See Comments)    REACTION: Unsure of reaction happene years ago  . Astelin [Azelastine Hcl] Other (See Comments)    Reaction unknown  . Cephalexin Other (See Comments)    REACTION: unsure of reaction happened yrs ago.  . Codeine Other (See Comments)    REACTION: abd. pain  . Lisinopril Cough  . Meloxicam Other (See Comments)    REACTION: GI symptoms  . Minocycline Other (See Comments)    Abdominal pain  . Nabumetone Other (See Comments)    REACTION: reaction not known  . Nyquil [Pseudoeph-Doxylamine-Dm-Apap] Hives  . Sulfa Antibiotics Other (See Comments)    Gi side eff   . Zolpidem Tartrate Other (See Comments)    REACTION: feels too drugged  . Buspar [Buspirone Hcl] Other (See Comments)    Dizziness, and not as effective for anxiety  . Ciprofloxacin Rash     VITALS:  Blood pressure 101/62, pulse 65, temperature 98 F (36.7 C), temperature source Oral, resp. rate 18, height '5\' 5"'$  (1.651 m), weight 103.3 kg (227 lb 11.2 oz), last menstrual period 02/10/1970, SpO2 95 %.  PHYSICAL EXAMINATION:  GENERAL:  75 y.o.-year-old patient lying in the bed with no acute distress.  EYES: Pupils equal, round, reactive to light and accommodation. No scleral icterus. Extraocular muscles intact.  HEENT: Head atraumatic, normocephalic. Oropharynx and nasopharynx clear.  NECK:  Supple, no jugular venous distention. No thyroid enlargement, no tenderness.  LUNGS: Normal breath sounds bilaterally, no wheezing, rales,rhonchi or crepitation. No use of accessory muscles of respiration.  CARDIOVASCULAR: S1, S2 normal. No murmurs, rubs, or gallops.  ABDOMEN: Soft, nontender, nondistended. Bowel sounds present. No organomegaly or mass.  EXTREMITIES: No pedal edema, cyanosis, or clubbing.  NEUROLOGIC: Cranial nerves II through XII are intact. Muscle strength 5/5 in all extremities. Sensation intact. Gait not checked.  PSYCHIATRIC: The patient is alert and oriented x 3.  SKIN: No obvious rash, lesion, or ulcer.    LABORATORY PANEL:   CBC  Recent Labs Lab 01/10/16 1011  WBC 8.3  HGB 9.2*  HCT 28.0*  PLT 14*   ------------------------------------------------------------------------------------------------------------------  Chemistries   Recent Labs Lab 01/09/16 0752  NA 136  K 3.8  CL 102  CO2 28  GLUCOSE 98  BUN 13  CREATININE 0.82  CALCIUM 8.3*   ------------------------------------------------------------------------------------------------------------------  Cardiac Enzymes No results for input(s): TROPONINI in the last  168 hours. ------------------------------------------------------------------------------------------------------------------  RADIOLOGY:  No results found.  EKG:   Orders placed or performed in visit on 12/18/15  . EKG  12-Lead   *Note: Due to a large number of results and/or encounters for the requested time period, some results have not been displayed. A complete set of results can be found in Results Review.    ASSESSMENT AND PLAN:   #1. Fever secondary to C. difficile colitis: Started on vancomycin, did increase the dose today because of persistent diarrhea., Recurrent C. difficile in 1 month. Patient says that he she feels better, no more chills. Because of recurrent C. difficile colitis continue vancomycin, GI consult for possible fecal transplant.   2 hypokalemia ; resolved.  #3 thrombocytopenia causing epistaxis, recurrence oflymphoma: Patient has appointment with primary oncologist in December, seen by oncologist here, advised to keep the appointments and, IVIG monthly as per her routine scheduled recommended by oncologist.  Check CBC today came in because of fall epistaxis: And see if she needs transfusion of platelets.  #4 hypothyroidism: Continue Synthroid.  #5 chronic systolic heart failure: Stable no evidence of acute CHF at this time. Decreased IV fluids, stop her IV fluids. Encourage by mouth intake.   #6:boost breeze mouth 3 times a day is a a recommended by dietitian. Patient is requesting the ensure. D/w RN,PT. #7 recurrent C. difficile colitis: Continue by mouth vancomycin, GI consult for possible stool transplant. #8. physical therapy recommended home health physical therapy  All the records are reviewed and case discussed with Care Management/Social Workerr. Management plans discussed with the patient, family and they are in agreement.  CODE STATUS: full  TOTAL TIME TAKING CARE OF THIS PATIENT: 45 minutes.   POSSIBLE D/C IN 1-2 DAYS, DEPENDING ON CLINICAL CONDITION.   Epifanio Lesches M.D on 01/11/2016 at 2:56 PM  Between 7am to 6pm - Pager - [phone]  After 6pm go to [email or web address] - password [email or web address] [facility]  Office   [phone]  CC: Primary care physician; [name], MD   Note: This dictation was prepared with Dragon dictation along with smaller phrase technology. Any transcriptional errors that result from this process are unintentional.

## 2016-01-12 LAB — TYPE AND SCREEN
Blood Product Expiration Date: 201712112359
Blood Product Expiration Date: 201712112359
UNIT TYPE AND RH: 6200
Unit Type and Rh: 6200

## 2016-01-12 LAB — CBC
HCT: 27.5 % — ABNORMAL LOW (ref 35.0–47.0)
Hemoglobin: 9.1 g/dL — ABNORMAL LOW (ref 12.0–16.0)
MCH: 32 pg (ref 26.0–34.0)
MCHC: 33.2 g/dL (ref 32.0–36.0)
MCV: 96.3 fL (ref 80.0–100.0)
PLATELETS: 27 10*3/uL — AB (ref 150–440)
RBC: 2.86 MIL/uL — ABNORMAL LOW (ref 3.80–5.20)
RDW: 21.9 % — AB (ref 11.5–14.5)
WBC: 6.6 10*3/uL (ref 3.6–11.0)

## 2016-01-12 LAB — CULTURE, BLOOD (ROUTINE X 2)
Culture: NO GROWTH
Culture: NO GROWTH

## 2016-01-12 NOTE — Consult Note (Signed)
GI Inpatient Consult Note  Reason for Consult: Recurrent C diff infection   Attending Requesting Consult: Dr. Vianne Bulls  History of Present Illness: Diamond Boyd is a 75 y.o. female with a known history of follicular lymphoma, paroxysmal Afib, chronic systolic CHF, PVD, HTN, colon cancer (2011, s/p colectomy), h/o splenectomy, h/o C diff infection (12/2015, s/p vancomycin 520m QID x 10 days), GERD, hypothyroidism, OA, DDD, and depression initially admitted with epistaxis on 01/07/16.  Epistaxis improved with Afrin via EMS, but patient was admitted for platelet transfusion due to severe thrombocytopenia (17 > 1 unit plts > 50).  During admission, she developed high fevers, poor appetite, and diarrhea.  C diff quick scan returned positive on 01/08/16, with subsequent toxin positive as well.  Patient was discussed with Dr. EVira Agar and initiating oral Vancomycin 2581mQID was recommended.  Notably, platelets continue to remain low at 18 > 16 > 14.  Today, patient denies specific GI complaints aside from semi-formed stools.  She denies significant abdominal pain, nausea, or vomiting. Patient feels afebrile.  Appetite remains low as well.  No history of C diff prior to last month.  Review of patient's EMR indicates she underwent a flexible sigmoidoscopy (Dr. NaSilverio Decampon 02/22/15 due to BRBPR.  Small/medium-sized internal hemorrhoids were noted, otherwise the scope was normal.  She also underwent a surveillance colonoscopy on 09/05/14, with two polyps removed in ascending colon and rectum, respectively.  Past Medical History:  Past Medical History:  Diagnosis Date  . Anemia    hx blood tx  . Benign essential HTN   . Cancer ([facility]   skin cancer- basal cell on arm / colon 2011  . Chronic systolic CHF (congestive heart failure), NYHA class 2 (HCAlderpoint12/2016  . Colon cancer (HC[facility]   2011, s/p surgery, in remission  . Colon polyps   . Degenerative disk disease    Thoracic spine  . Depression   .  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 Generalized weakness 05/11/2015  . GERD (gastroesophageal reflux disease)   . Heart murmur   . hodgkins lymphoma   . Hypothyroidism   . Lymphoma (HCS[facility]  . Neuropathy due to chemotherapeutic drug (HCHay[facility]5/10/2014  . NICM (nonischemic cardiomyopathy) (HCCact[facility]12/2016  . Osteoarthritis    hands  . 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 PAF (paroxysmal atrial fibrillation) (HCJunction [facility]  . Peripheral vascular disease (HCP[facility]  . Pneumonia     Problem List: Patient Active Problem List   Diagnosis Date Noted  . Dehydration 12/31/2015  . Neoplastic (malignant) related fatigue 12/31/2015  . Unintentional weight loss 12/31/2015  . Epistaxis 12/31/2015  . Hypoalbuminemia due to protein-calorie malnutrition (HCNor[facility]11/20/2017  . C. difficile colitis   . Colitis 12/11/2015  . Basal cell carcinoma 10/31/2015  . Neuropathy (HCMaui09/20/2017  . S/P cataract surgery 10/31/2015  . Chronic rhinitis 10/31/2015  . Moderate persistent asthma 10/31/2015  . Physical deconditioning 10/16/2015  . Chronic ethmoidal sinusitis 09/18/2015  . Hypogammaglobulinemia, acquired (HCGranger07/31/2017  . Cough 07/18/2015  . Port catheter in place 07/11/2015  . Generalized weakness 05/11/2015  . Benign essential HTN   . PAF (paroxysmal atrial fibrillation) (HCMcCall[facility]  . Internal hemorrhoids 02/21/2015  . Rectal bleeding 02/06/2015  . 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 Thrombocytopenia (HCGarfield12/19/2016  . AKI (acute kidney injury) (HCElizabeth City  . Elevated troponin   . Atrial  fibrillation with rapid ventricular response ([place]) 01/23/2015  . New onset a-fib ([place]) 01/23/2015  . Hypertension 01/23/2015  . CHF (congestive heart failure) ([place]) 01/21/2015  . Chronic systolic CHF (congestive heart failure), NYHA class 2 ([place]) 01/11/2015  . Fever in adult 01/10/2015  . Immunocompromised status associated  with infection ([place]) 01/08/2015  . Grade 3b follicular lymphoma of lymph nodes of multiple regions ([place]) 12/28/2014  . Follicular lymphoma of lymph nodes of multiple regions ([place]) 12/11/2014  . Preventive measure 11/14/2014  . Parotid mass 11/14/2014  . Chronic lower back pain 09/28/2014  . Mediastinal lymphadenopathy 09/27/2014  . Neuropathy due to chemotherapeutic drug ([place]) 06/19/2014  . Peripheral neuropathy due to chemotherapy ([place]) 04/03/2014  . Trochanteric bursitis of right hip 04/03/2014  . Anemia in chronic illness 02/21/2014  . Elevated liver enzymes 02/21/2014  . Urinary tract infection 11/18/2013  . Candidal intertrigo 09/23/2013  . Atrophic vaginitis 09/23/2013  . Drug-induced neutropenia ([place]) 09/01/2013  . History of colon cancer 06/17/2013  . History of Hodgkin's lymphoma 05/20/2013  . Conjunctivitis, acute 04/27/2013  . Cold sore 04/27/2013  . Hyperglycemia 03/11/2013  . Hodgkin lymphoma ([place]) 02/10/2013  . Acute maxillary sinusitis 02/09/2013  . Other malaise and fatigue 01/04/2013  . Shingles 11/22/2012  . Cystocele 09/22/2012  . Encounter for Medicare annual wellness exam 09/17/2012  . Left knee pain 09/10/2012  . Thoracic back pain 06/01/2012  . Skin lesion of face 05/17/2012  . Other screening mammogram 07/22/2011  . Routine gynecological examination 07/22/2011  . Colon cancer screening 07/22/2011  . History of anemia 05/06/2011  . 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 Hypothyroid 02/24/2011  . Varicose veins 02/24/2011  . Obesity 11/20/2010  . Colon cancer ([place]) 08/23/2009  . Hyperphosphatemia 08/09/2009  . Chronic maxillary sinusitis 07/03/2009  . THROAT PAIN, CHRONIC 07/03/2009  . S/P colon resection 02/10/2009  . GERD 12/20/2008  . Anxiety state 08/22/2008  . MENOPAUSAL SYNDROME 07/13/2007  . High cholesterol 07/08/2006  . Allergic rhinitis 07/08/2006  . OVERACTIVE BLADDER 07/08/2006  . 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 HYPOTHYROIDISM  06/25/2006  . Depression 06/25/2006  . PERIPHERAL VASCULAR DISEASE 06/25/2006  . OSTEOARTHRITIS 06/25/2006  . LEG EDEMA 06/25/2006  . SKIN CANCER, HX OF 06/25/2006  . COLONIC POLYPS, HX OF 06/25/2006    Past Surgical History: Past Surgical History:  Procedure Laterality Date  . BONE MARROW BIOPSY  05/27/13  . BREAST BIOPSY  1996  . CARDIAC CATHETERIZATION N/A 01/25/2015   Procedure: Left Heart Cath and Coronary Angiography;  Surgeon: Peter M Martinique, MD;  Location: [facility];  Service: Cardiovascular;  Laterality: N/A;  . CATARACT EXTRACTION, BILATERAL  2016  . CHOLECYSTECTOMY    . COLECTOMY  8/11  . COLONOSCOPY W/ BIOPSIES    . DG BIOPSY LUNG    . LYMPH NODE BIOPSY N/A 11/01/2014   Procedure: MEDIASTINAL LYMPH NODE BIOPSY;  Surgeon: Grace Isaac, MD;  Location: [facility];  Service: Thoracic;  Laterality: N/A;  . MEDIASTINOSCOPY N/A 11/01/2014   Procedure: MEDIASTINOSCOPY;  Surgeon: Grace Isaac, MD;  Location: [facility];  Service: Thoracic;  Laterality: N/A;  . PLANTAR FASCIA RELEASE    . port-a-cath insertion    . SPLENECTOMY     lymphoma  . TENDON RELEASE     Right thumb   . VAGINAL HYSTERECTOMY    . VENTRAL HERNIA REPAIR  1998  . VIDEO BRONCHOSCOPY WITH ENDOBRONCHIAL ULTRASOUND N/A 10/17/2014   Procedure: VIDEO BRONCHOSCOPY WITH ENDOBRONCHIAL ULTRASOUND;  Surgeon: Javier Glazier, MD;  Location: [facility];  Service: Thoracic;  Laterality: N/A;  . VIDEO BRONCHOSCOPY WITH ENDOBRONCHIAL ULTRASOUND N/A 11/01/2014   Procedure: VIDEO BRONCHOSCOPY WITH ENDOBRONCHIAL ULTRASOUND;  Surgeon: Grace Isaac, MD;  Location: [facility];  Service: Thoracic;  Laterality: N/A;    Allergies: Allergies  Allergen Reactions  . Achromycin [Tetracycline Hcl] Other (See Comments)    Pt does not remember reaction  . Allopurinol Other (See Comments)    REACTION: Unsure of reaction happene years ago  . Astelin [Azelastine Hcl] Other (See Comments)    Reaction unknown  . Cephalexin Other (See  Comments)    REACTION: unsure of reaction happened yrs ago.  . Codeine Other (See Comments)    REACTION: abd. pain  . Lisinopril Cough  . Meloxicam Other (See Comments)    REACTION: GI symptoms  . Minocycline Other (See Comments)    Abdominal pain  . Nabumetone Other (See Comments)    REACTION: reaction not known  . Nyquil [Pseudoeph-Doxylamine-Dm-Apap] Hives  . Sulfa Antibiotics Other (See Comments)    Gi side eff   . Zolpidem Tartrate Other (See Comments)    REACTION: feels too drugged  . Buspar [Buspirone Hcl] Other (See Comments)    Dizziness, and not as effective for anxiety  . Ciprofloxacin Rash    Home Medications: Prescriptions Prior to Admission  Medication Sig Dispense Refill Last Dose  . acetaminophen (TYLENOL) 650 MG CR tablet Take 650 mg by mouth every 8 (eight) hours as needed for pain or fever.    01/05/2016 at Unknown time  . Acidophilus Lactobacillus CAPS Take 1 capsule by mouth 3 (three) times daily. 90 capsule 0 01/05/2016 at Unknown time  . acyclovir (ZOVIRAX) 400 MG tablet Take 1 tablet (400 mg total) by mouth daily. 90 tablet 3 01/01/2016 at Unknown time  . Albuterol Sulfate (PROAIR RESPICLICK) 932 (90 Base) MCG/ACT AEPB Inhale 1-2 puffs into the lungs every 6 (six) hours as needed (SOB, wheezing).    01/05/2016 at Unknown time  . BREO ELLIPTA 200-25 MCG/INH AEPB Inhale 1 puff into the lungs every evening.    01/05/2016 at Unknown time  . Calcium Carbonate-Vitamin D (CALCIUM 600+D) 600-400 MG-UNIT per tablet Take 1 tablet by mouth every evening.    01/05/2016 at Unknown time  . Cholecalciferol (VITAMIN D-3) 1000 UNITS CAPS Take 1,000 Units by mouth daily.   01/05/2016 at Unknown time  . docusate sodium (COLACE) 100 MG capsule Take 100 mg by mouth at bedtime.   01/05/2016 at Unknown time  . estradiol (ESTRACE) 1 MG tablet Take 1 tablet (1 mg total) by mouth daily. 90 tablet 3 01/05/2016 at Unknown time  . fexofenadine (ALLEGRA) 180 MG tablet TAKE 1 TABLET (180 MG  TOTAL) BY MOUTH DAILY. (*OTC)  3 01/05/2016 at Unknown time  . FLUoxetine (PROZAC) 20 MG capsule Take 1 capsule (20 mg total) by mouth daily. 90 capsule 3 01/05/2016 at Unknown time  . furosemide (LASIX) 20 MG tablet Take 1 tablet (20 mg total) by mouth daily. Take 1 tab by mouth daily for weight gain. 90 tablet 3 01/05/2016 at Unknown time  . gabapentin (NEURONTIN) 300 MG capsule Take 1 capsule (300 mg total) by mouth 2 (two) times daily. 180 capsule 3 01/05/2016 at Unknown time  . levothyroxine (SYNTHROID, LEVOTHROID) 112 MCG tablet Take 1 tablet (112 mcg total) by mouth daily before breakfast. 90 tablet 3 01/05/2016 at Unknown time  . lidocaine (XYLOCAINE) 5 % ointment Apply 1 application topically  3 (three) times daily as needed. To rectal area 35.44 g 1 prn  . lidocaine-prilocaine (EMLA) cream Apply 1 application topically as needed (For port-a-cath.). Reported on 02/22/2015   prn at Unknown time  . LORazepam (ATIVAN) 0.5 MG tablet Take 1 tablet (0.5 mg total) by mouth every 12 (twelve) hours as needed for anxiety. 15 tablet 0   . losartan (COZAAR) 25 MG tablet Take 1 tablet (25 mg total) by mouth daily. 90 tablet 3 01/05/2016 at Unknown time  . metoprolol (LOPRESSOR) 50 MG tablet TAKE 1 TABLET (50 MG TOTAL) BY MOUTH 2 (TWO) TIMES DAILY. 180 tablet 3 01/05/2016 at Unknown time  . Omega-3 350 MG CAPS Take 1 capsule by mouth daily. Reported on 02/22/2015   01/05/2016 at Unknown time  . omeprazole (PRILOSEC) 40 MG capsule    01/05/2016 at Unknown time  . ondansetron (ZOFRAN) 8 MG tablet Take 8 mg by mouth every 6 (six) hours as needed for nausea or vomiting. Reported on 03/05/2015   prn  . polyethylene glycol (MIRALAX / GLYCOLAX) packet Take 17 g by mouth daily as needed for moderate constipation.    01/05/2016 at Unknown time  . prochlorperazine (COMPAZINE) 10 MG tablet Take 10 mg by mouth every 6 (six) hours as needed for nausea or vomiting. Reported on 03/05/2015   prn  . psyllium (METAMUCIL) 58.6 %  packet Take 1-3 packets by mouth at bedtime.    01/05/2016 at Unknown time  . simvastatin (ZOCOR) 40 MG tablet Take 1 tablet (40 mg total) by mouth at bedtime. 90 tablet 3 01/05/2016 at Unknown time  . celecoxib (CELEBREX) 200 MG capsule Take 1 capsule (200 mg total) by mouth daily as needed for moderate pain. (Patient not taking: Reported on 01/06/2016) 90 capsule 3 Not Taking at Unknown time  . fluticasone (FLONASE) 50 MCG/ACT nasal spray Place 2 sprays into both nostrils daily. (Patient not taking: Reported on 01/06/2016) 48 g 3 Not Taking at Unknown time   Home medication reconciliation was completed with the patient.   Scheduled Inpatient Medications:   . acidophilus  1 capsule Oral TID  . acyclovir  400 mg Oral Daily  . calcium-vitamin D  1 tablet Oral QPM  . cholecalciferol  1,000 Units Oral Daily  . estradiol  1 mg Oral Daily  . feeding supplement (ENSURE ENLIVE)  237 mL Oral BID BM  . FLUoxetine  20 mg Oral Daily  . fluticasone furoate-vilanterol  1 puff Inhalation QPM  . furosemide  20 mg Oral Daily  . gabapentin  300 mg Oral BID  . levothyroxine  112 mcg Oral QAC breakfast  . loratadine  10 mg Oral Daily  . metoprolol  50 mg Oral BID  . omega-3 acid ethyl esters  1 g Oral Daily  . pantoprazole  40 mg Oral Daily  . simvastatin  40 mg Oral QHS  . vancomycin  500 mg Oral Q6H    Continuous Inpatient Infusions:    PRN Inpatient Medications:  acetaminophen **OR** acetaminophen, albuterol, lidocaine-prilocaine, magnesium citrate, ondansetron (ZOFRAN) IV, ondansetron, prochlorperazine, senna-docusate, sodium chloride  Family History: family history includes Alcohol abuse in her sister; Asthma in her daughter; COPD in her daughter; Clotting disorder in her brother; Coronary artery disease in her mother; Diabetes in her brother and mother; Fibromyalgia in her daughter; Rheum arthritis in her brother.  The patient's family history is negative for inflammatory bowel disorders, GI  malignancy, or solid organ transplantation.  Social History:   reports that she has  never smoked. She has never used smokeless tobacco. She reports that she does not drink alcohol or use drugs. The patient denies ETOH, tobacco, or drug use.   Review of Systems: Constitutional: Weight is stable.  Eyes: No changes in vision. ENT: No oral lesions, sore throat.  GI: see HPI.  Heme/Lymph: No easy bruising.  CV: No chest pain.  GU: No hematuria.  Integumentary: No rashes.  Neuro: No headaches.  Psych: No depression/anxiety.  Endocrine: No heat/cold intolerance.  Allergic/Immunologic: No urticaria.  Resp: No cough, SOB.  Musculoskeletal: No joint swelling.    Physical Examination: BP 132/62 (BP Location: Right Arm)   Pulse 88   Temp 98.3 F (36.8 C) (Oral)   Resp 18   Ht 5' 5"  (1.651 m)   Wt 103.3 kg (227 lb 11.2 oz)   LMP 02/10/1970   SpO2 97%   BMI 37.89 kg/m  Gen: NAD, alert and oriented x 4 HEENT: PEERLA, EOMI, Neck: supple, no JVD or thyromegaly Chest: CTA bilaterally, no wheezes, crackles, or other adventitious sounds CV: RRR, no m/g/c/r Abd: soft, NT, ND, +BS in all four quadrants; no HSM, guarding, ridigity, or rebound tenderness Ext: no edema, well perfused with 2+ pulses, Skin: no rash or lesions noted Lymph: no LAD  Data: Lab Results  Component Value Date   WBC 8.3 01/10/2016   HGB 9.2 (L) 01/10/2016   HCT 28.0 (L) 01/10/2016   MCV 94.0 01/10/2016   PLT 14 (LL) 01/10/2016    Recent Labs Lab 01/08/16 1040 01/09/16 0752 01/10/16 1011  HGB 10.7* 10.2* 9.2*   Lab Results  Component Value Date   NA 136 01/09/2016   K 3.8 01/09/2016   CL 102 01/09/2016   CO2 28 01/09/2016   BUN 13 01/09/2016   CREATININE 0.82 01/09/2016   Lab Results  Component Value Date   ALT 24 01/01/2016   AST 59 (H) 01/01/2016   ALKPHOS 273 (H) 01/01/2016   BILITOT 1.0 01/01/2016    Recent Labs Lab 01/06/16 2127  APTT 45*  INR 1.18   Assessment/Plan: Ms. Mcgraw  is a 75 y.o. female with a known history of follicular lymphoma, paroxysmal Afib, chronic systolic CHF, PVD, HTN, colon cancer (2011, s/p colectomy), h/o splenectomy, h/o C diff infection (12/2015, s/p vancomycin 560m QID x 10 days), GERD, hypothyroidism, OA, DDD, and depression initially admitted with epistaxis on 01/07/16.  During admission, patient developed fevers, poor appetite, and diarrhea.  C diff antigen and toxin returned positive.  She was initiated on oral vanc last night, with semi-formed stool and no new GI complaints today.  Recommendations: - Continue oral vancomycin 2585mQID x 14 days - Procedures no recommended due to severe thrombocytopenia - if necessary, would need to transfuse platelets to >60. - If patient has another episode of C diff, recommend consideration of stool transplant - Further recs per Dr. ElVira Agar Will continue to monitor   Thank you for the consult. We will follow along with you. Please call with questions or concerns.  MiLavera GuisePA-C [facility] Phone: [phone]: [phone]

## 2016-01-12 NOTE — Consult Note (Signed)
See PA note.  Vancomycin recommended at '250mg'$  qid for 14 days.  Dificid has a slightly better relapse rate but cost is much greater.  Any colonoscopy would have to be done with platelet count above 60K.  Will follow with you and follow up as out patient.

## 2016-01-12 NOTE — Progress Notes (Signed)
[place] [facility] [place] NAME: Diamond Boyd    MR#:  [id number]  DATE OF BIRTH:  [birth year]  SUBJECTIVE:  CHIEF COMPLAINT:   Chief Complaint  Patient presents with  . Epistaxis   -Admitted with diarrhea and noticed to have recurrent C. difficile. Improving with oral vancomycin. -Also acute thrombocytopenia from lymphoma. Improving slowly  REVIEW OF SYSTEMS:  Review of Systems  Constitutional: Negative for chills and fever.  HENT: Negative for congestion, ear discharge, hearing loss, nosebleeds and sore throat.   Eyes: Negative for blurred vision and double vision.  Respiratory: Negative for cough, shortness of breath and wheezing.   Cardiovascular: Negative for chest pain, palpitations and leg swelling.  Gastrointestinal: Positive for diarrhea. Negative for abdominal pain, constipation, nausea and vomiting.  Genitourinary: Negative for dysuria.  Musculoskeletal: Negative for myalgias.  Neurological: Positive for weakness. Negative for dizziness, speech change, focal weakness, seizures and headaches.  Psychiatric/Behavioral: Negative for depression.    DRUG ALLERGIES:   Allergies  Allergen Reactions  . Achromycin [Tetracycline Hcl] Other (See Comments)    Pt does not remember reaction  . Allopurinol Other (See Comments)    REACTION: Unsure of reaction happene years ago  . Astelin [Azelastine Hcl] Other (See Comments)    Reaction unknown  . Cephalexin Other (See Comments)    REACTION: unsure of reaction happened yrs ago.  . Codeine Other (See Comments)    REACTION: abd. pain  . Lisinopril Cough  . Meloxicam Other (See Comments)    REACTION: GI symptoms  . Minocycline Other (See Comments)    Abdominal pain  . Nabumetone Other (See Comments)    REACTION: reaction not known  . Nyquil [Pseudoeph-Doxylamine-Dm-Apap] Hives  . Sulfa Antibiotics Other (See Comments)    Gi side eff   . Zolpidem Tartrate Other (See Comments)   REACTION: feels too drugged  . Buspar [Buspirone Hcl] Other (See Comments)    Dizziness, and not as effective for anxiety  . Ciprofloxacin Rash    VITALS:  Blood pressure 132/62, pulse 88, temperature 98.3 F (36.8 C), temperature source Oral, resp. rate 18, height '5\' 5"'$  (1.651 m), weight 103.3 kg (227 lb 11.2 oz), last menstrual period 02/10/1970, SpO2 97 %.  PHYSICAL EXAMINATION:  Physical Exam  GENERAL:  75 y.o.-year-old patient lying in the bed with no acute distress.  EYES: Pupils equal, round, reactive to light and accommodation. No scleral icterus. Extraocular muscles intact.  HEENT: Head atraumatic, normocephalic. Oropharynx and nasopharynx clear.  NECK:  Supple, no jugular venous distention. No thyroid enlargement, no tenderness.  LUNGS: Normal breath sounds bilaterally, no wheezing, rales,rhonchi or crepitation. No use of accessory muscles of respiration.  CARDIOVASCULAR: S1, S2 normal. No murmurs, rubs, or gallops.  ABDOMEN: Soft, nontender, nondistended. Bowel sounds present. No organomegaly or mass.  EXTREMITIES: No pedal edema, cyanosis, or clubbing.  NEUROLOGIC: Cranial nerves II through XII are intact. Muscle strength 5/5 in all extremities. Sensation intact. Gait not checked. Global weakness noted PSYCHIATRIC: The patient is alert and oriented x 3.  SKIN: No obvious rash, lesion, or ulcer.    LABORATORY PANEL:   CBC  Recent Labs Lab 01/12/16 1328  WBC 6.6  HGB 9.1*  HCT 27.5*  PLT 27*   ------------------------------------------------------------------------------------------------------------------  Chemistries   Recent Labs Lab 01/09/16 0752  NA 136  K 3.8  CL 102  CO2 28  GLUCOSE 98  BUN 13  CREATININE 0.82  CALCIUM 8.3*   ------------------------------------------------------------------------------------------------------------------  Cardiac Enzymes  No results for input(s): TROPONINI in the last 168  hours. ------------------------------------------------------------------------------------------------------------------  RADIOLOGY:  No results found.  EKG:   Orders placed or performed in visit on 12/18/15  . EKG 12-Lead   *Note: Due to a large number of results and/or encounters for the requested time period, some results have not been displayed. A complete set of results can be found in Results Review.    ASSESSMENT AND PLAN:   75 year old female with past medical history significant for B-cell lymphoma, systolic CHF, colon cancer, chronic anemia, recent history of C. difficile 2 weeks ago presents again to the hospital complaining of fevers and diarrhea. Also noted to have acute on chronic thrombocytopenia.  #1 recurrent C. difficile colitis-appreciate GI consult. No plans for stool transplant due to thrombocytopenia. -Continue oral vancomycin for now. Also on probiotics. -Continue to monitor stools. Getting more softer today.  #2 acute on chronic thrombocytopenia-presented with epistaxis, baseline counts seem to be around 30-50. -Received transfusion but further drop of platelets noted. -Advised to follow-up with her oncologist as outpatient for possible progression of her lymphoma. -At this time platelet counts are stable and greater than 27K.  #3 follicular lymphoma-appreciate oncology consult. CT from 12/11/2015 showing possible progression of disease. PET scan scheduled next week. Follow-up with primary oncologist as prior scheduled  #4 anemia-only mildly decreased than baseline. No indication for transfusion. Continue to follow up  #5 hypothyroidism-continue Synthroid.  #6 hypertension-on Lasix, metoprolol  #7 DVT prophylaxis-Ted's and SCDs. No heparin products due to thrombocytopenia   All the records are reviewed and case discussed with Care Management/Social Workerr. Management plans discussed with the patient, family and they are in agreement.  CODE STATUS:  Full code  TOTAL TIME TAKING CARE OF THIS PATIENT: 38 minutes.   POSSIBLE D/C IN 1-2 DAYS, DEPENDING ON CLINICAL CONDITION.   Gladstone Lighter M.D on 01/12/2016 at 3:06 PM  Between 7am to 6pm - Pager - [phone]  After 6pm go to [email or web address] - password [email or web address] [facility]  Office  [phone]  CC: Primary care physician; [name], MD

## 2016-01-13 LAB — BASIC METABOLIC PANEL
ANION GAP: 5 (ref 5–15)
BUN: 11 mg/dL (ref 6–20)
CALCIUM: 8.5 mg/dL — AB (ref 8.9–10.3)
CO2: 30 mmol/L (ref 22–32)
Chloride: 104 mmol/L (ref 101–111)
Creatinine, Ser: 0.7 mg/dL (ref 0.44–1.00)
GLUCOSE: 101 mg/dL — AB (ref 65–99)
POTASSIUM: 3.8 mmol/L (ref 3.5–5.1)
SODIUM: 139 mmol/L (ref 135–145)

## 2016-01-13 LAB — CBC
HEMATOCRIT: 28.6 % — AB (ref 35.0–47.0)
HEMOGLOBIN: 9.5 g/dL — AB (ref 12.0–16.0)
MCH: 31.8 pg (ref 26.0–34.0)
MCHC: 33.2 g/dL (ref 32.0–36.0)
MCV: 95.7 fL (ref 80.0–100.0)
Platelets: 20 10*3/uL — CL (ref 150–440)
RBC: 3 MIL/uL — AB (ref 3.80–5.20)
RDW: 21.5 % — ABNORMAL HIGH (ref 11.5–14.5)
WBC: 5.6 10*3/uL (ref 3.6–11.0)

## 2016-01-13 MED ORDER — SODIUM CHLORIDE 0.9% FLUSH
10.0000 mL | Freq: Once | INTRAVENOUS | Status: AC
  Administered 2016-01-13: 10 mL via INTRAVENOUS

## 2016-01-13 MED ORDER — VANCOMYCIN 50 MG/ML ORAL SOLUTION: 500.0000 mg | Freq: Four times a day (QID) | ORAL | 0 refills | Status: DC

## 2016-01-13 MED ORDER — RISAQUAD PO CAPS: 1.0000 | ORAL_CAPSULE | Freq: Three times a day (TID) | ORAL | 0 refills | Status: DC

## 2016-01-13 NOTE — Progress Notes (Signed)
Pt is to be discharged home with her husband today. I spoke to Dr. Tressia Miners concerning how to deaccess patient's port. Per Dr. Tressia Miners pt is not to have heparin products and I am to flush her port with NS only prior to deaccessing port. Pt is in NAD, IV is out, central line has been deaccessed, all paperwork has been reviewed/discussed with patient and her husband, and there are no questions/concerns at this time. Assessment is unchanged from this morning. Pt is to be accompanied downstairs by staff and family via wheelchair.

## 2016-01-13 NOTE — Consult Note (Signed)
Patient with mushy stools, VSS afeb. hgb 9.5 WBC 5.6, plt ct 20K.  Possible home from GI standpoint tomorrow but depends on hematology/hospitalist.  Will need 14 days of vancomycin.  Discussed cleaning of surfaces at home with dilute bleach with her husband.

## 2016-01-13 NOTE — Progress Notes (Signed)
Pt transported in transport chair to private vehicle for discharge home with family. Discharge home to care for family.

## 2016-01-13 NOTE — Progress Notes (Signed)
Paged and spoke with Dr. Marcille Blanco and report critical platelet of 20. No new orders received. Will continue to monitor.

## 2016-01-13 NOTE — Care Management Important Message (Signed)
Important Message  Patient Details  Name: Diamond Boyd MRN: [id number] Date of Birth: July 16, 1940   Medicare Important Message Given:  Yes    Robin Pafford A, RN 01/13/2016, 4:11 PM

## 2016-01-14 NOTE — Discharge Summary (Signed)
[facility] [place] [facility] NAME: Diamond Boyd    MR#:  [id number]  DATE OF BIRTH:  [birth year]  DATE OF ADMISSION:  01/06/2016   ADMITTING PHYSICIAN: Harvie Bridge, DO  DATE OF DISCHARGE: 01/13/2016  4:30 PM  PRIMARY CARE PHYSICIAN: [name], MD   ADMISSION DIAGNOSIS:   Epistaxis [R04.0] Thrombocytopenia ([place]) [D69.6]  DISCHARGE DIAGNOSIS:   Active Problems:  Recurrent C.diff colitis Thrombocytopenia ([place])   SECONDARY DIAGNOSIS:   Past Medical History:  Diagnosis Date  . Anemia    hx blood tx  . Benign essential HTN   . Cancer ([place])    skin cancer- basal cell on arm / colon 2011  . Chronic systolic CHF (congestive heart failure), NYHA class 2 ([place]) 01/2015  . Colon cancer ([place])    2011, s/p surgery, in remission  . Colon polyps   . Degenerative disk disease    Thoracic spine  . Depression   . 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 Generalized weakness 05/11/2015  . GERD (gastroesophageal reflux disease)   . Heart murmur   . hodgkins lymphoma   . Hypothyroidism   . Lymphoma ([place])   . Neuropathy due to chemotherapeutic drug ([place]) 06/19/2014  . NICM (nonischemic cardiomyopathy) ([place]) 01/2015  . Osteoarthritis    hands  . 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 PAF (paroxysmal atrial fibrillation) ([place])   . Peripheral vascular disease ([place])   . Pneumonia     HOSPITAL COURSE:    75 year old female with past medical history significant for B-cell lymphoma, systolic CHF, colon cancer, chronic anemia, recent history of C. difficile 2 weeks ago presents again to the hospital complaining of fevers and diarrhea. Also noted to have acute on chronic thrombocytopenia.  #1 recurrent C. difficile colitis-appreciate GI consult. No plans for stool transplant due to thrombocytopenia. -Continue oral vancomycin for now. Improved  symptoms. Also on probiotics. -Advised to clean everything at house with wipes and also follow proper hand hygiene to prevent recurrence.  #2 acute on chronic thrombocytopenia-presented with epistaxis, baseline counts seem to be around 30-50. -Received transfusion but further drop of platelets noted. -Advised to follow-up with her oncologist as outpatient for possible progression of her lymphoma. -Platelet counts are stable at 20 K. No active bleeding noted. Discussed with on-call oncologist prior to discharge.  #3 follicular lymphoma-appreciate oncology consult. CT from 12/11/2015 showing possible progression of disease. PET scan scheduled next week. Follow-up with primary oncologist as prior scheduled  #4 anemia-only mildly decreased than baseline. No indication for transfusion. Continue to follow up  #5 hypothyroidism-continue Synthroid.  #6 hypertension-on Lasix, Cozaar and metoprolol  Patient being discharged today and outpatient follow-up recommended  DISCHARGE CONDITIONS:   Guarded  CONSULTS OBTAINED:   Treatment Team:  Lloyd Huger, MD Manya Silvas, MD  DRUG ALLERGIES:   Allergies  Allergen Reactions  . Achromycin [Tetracycline Hcl] Other (See Comments)    Pt does not remember reaction  . Allopurinol Other (See Comments)    REACTION: Unsure of reaction happene years ago  . Astelin [Azelastine Hcl] Other (See Comments)    Reaction unknown  . Cephalexin Other (See Comments)    REACTION: unsure of reaction happened yrs ago.  . Codeine Other (See Comments)    REACTION: abd. pain  . Lisinopril Cough  . Meloxicam Other (See Comments)    REACTION: GI symptoms  .  Minocycline Other (See Comments)    Abdominal pain  . Nabumetone Other (See Comments)    REACTION: reaction not known  . Nyquil [Pseudoeph-Doxylamine-Dm-Apap] Hives  . Sulfa Antibiotics Other (See Comments)    Gi side eff   . Zolpidem Tartrate Other (See Comments)    REACTION: feels too  drugged  . Buspar [Buspirone Hcl] Other (See Comments)    Dizziness, and not as effective for anxiety  . Ciprofloxacin Rash   DISCHARGE MEDICATIONS:     Medication List    STOP taking these medications   Acidophilus Lactobacillus Caps   celecoxib 200 MG capsule Commonly known as:  CELEBREX   docusate sodium 100 MG capsule Commonly known as:  COLACE   fluticasone 50 MCG/ACT nasal spray Commonly known as:  FLONASE   lidocaine 5 % ointment Commonly known as:  XYLOCAINE   polyethylene glycol packet Commonly known as:  MIRALAX / GLYCOLAX   psyllium 58.6 % packet Commonly known as:  METAMUCIL     TAKE these medications   acetaminophen 650 MG CR tablet Commonly known as:  TYLENOL Take 650 mg by mouth every 8 (eight) hours as needed for pain or fever.   acidophilus Caps capsule Take 1 capsule by mouth 3 (three) times daily. X 10 days   acyclovir 400 MG tablet Commonly known as:  ZOVIRAX Take 1 tablet (400 mg total) by mouth daily.   BREO ELLIPTA 200-25 MCG/INH Aepb Generic drug:  fluticasone furoate-vilanterol Inhale 1 puff into the lungs every evening.   CALCIUM 600+D 600-400 MG-UNIT tablet Generic drug:  Calcium Carbonate-Vitamin D Take 1 tablet by mouth every evening.   estradiol 1 MG tablet Commonly known as:  ESTRACE Take 1 tablet (1 mg total) by mouth daily.   fexofenadine 180 MG tablet Commonly known as:  ALLEGRA TAKE 1 TABLET (180 MG TOTAL) BY MOUTH DAILY. (*OTC)   FLUoxetine 20 MG capsule Commonly known as:  PROZAC Take 1 capsule (20 mg total) by mouth daily.   furosemide 20 MG tablet Commonly known as:  LASIX Take 1 tablet (20 mg total) by mouth daily. Take 1 tab by mouth daily for weight gain.   gabapentin 300 MG capsule Commonly known as:  NEURONTIN Take 1 capsule (300 mg total) by mouth 2 (two) times daily.   levothyroxine 112 MCG tablet Commonly known as:  SYNTHROID, LEVOTHROID Take 1 tablet (112 mcg total) by mouth daily before  breakfast.   lidocaine-prilocaine cream Commonly known as:  EMLA Apply 1 application topically as needed (For port-a-cath.). Reported on 02/22/2015   LORazepam 0.5 MG tablet Commonly known as:  ATIVAN Take 1 tablet (0.5 mg total) by mouth every 12 (twelve) hours as needed for anxiety.   losartan 25 MG tablet Commonly known as:  COZAAR Take 1 tablet (25 mg total) by mouth daily.   metoprolol 50 MG tablet Commonly known as:  LOPRESSOR TAKE 1 TABLET (50 MG TOTAL) BY MOUTH 2 (TWO) TIMES DAILY.   Omega-3 350 MG Caps Take 1 capsule by mouth daily. Reported on 02/22/2015   omeprazole 40 MG capsule Commonly known as:  PRILOSEC   ondansetron 8 MG tablet Commonly known as:  ZOFRAN Take 8 mg by mouth every 6 (six) hours as needed for nausea or vomiting. Reported on 3/00/9233   PROAIR RESPICLICK 007 (90 Base) MCG/ACT Aepb Generic drug:  Albuterol Sulfate Inhale 1-2 puffs into the lungs every 6 (six) hours as needed (SOB, wheezing).   prochlorperazine 10 MG tablet Commonly known as:  COMPAZINE Take 10 mg by mouth every 6 (six) hours as needed for nausea or vomiting. Reported on 03/05/2015   simvastatin 40 MG tablet Commonly known as:  ZOCOR Take 1 tablet (40 mg total) by mouth at bedtime.   vancomycin 50 mg/mL oral solution Commonly known as:  VANCOCIN Take 10 mLs (500 mg total) by mouth every 6 (six) hours. X 12 more days   Vitamin D-3 1000 units Caps Take 1,000 Units by mouth daily.        DISCHARGE INSTRUCTIONS:   1. Oncology follow-up in 2 days for platelet counts 2. PCP follow-up in 1-2 weeks  DIET:   Cardiac diet  ACTIVITY:   Activity as tolerated  OXYGEN:   Home Oxygen: No.  Oxygen Delivery: room air  DISCHARGE LOCATION:   home   If you experience worsening of your admission symptoms, develop shortness of breath, life threatening emergency, suicidal or homicidal thoughts you must seek medical attention immediately by calling 911 or calling your MD  immediately  if symptoms less severe.  You Must read complete instructions/literature along with all the possible adverse reactions/side effects for all the Medicines you take and that have been prescribed to you. Take any new Medicines after you have completely understood and accpet all the possible adverse reactions/side effects.   Please note  You were cared for by a hospitalist during your hospital stay. If you have any questions about your discharge medications or the care you received while you were in the hospital after you are discharged, you can call the unit and asked to speak with the hospitalist on call if the hospitalist that took care of you is not available. Once you are discharged, your primary care physician will handle any further medical issues. Please note that NO REFILLS for any discharge medications will be authorized once you are discharged, as it is imperative that you return to your primary care physician (or establish a relationship with a primary care physician if you do not have one) for your aftercare needs so that they can reassess your need for medications and monitor your lab values.    On the day of Discharge:  VITAL SIGNS:   Blood pressure (!) 115/42, pulse 78, temperature 98.1 F (36.7 C), temperature source Oral, resp. rate 18, height '5\' 5"'$  (1.651 m), weight 103.3 kg (227 lb 11.2 oz), last menstrual period 02/10/1970, SpO2 95 %.  PHYSICAL EXAMINATION:    GENERAL:  75 y.o.-year-old patient lying in the bed with no acute distress.  EYES: Pupils equal, round, reactive to light and accommodation. No scleral icterus. Extraocular muscles intact.  HEENT: Head atraumatic, normocephalic. Oropharynx and nasopharynx clear.  NECK:  Supple, no jugular venous distention. No thyroid enlargement, no tenderness.  LUNGS: Normal breath sounds bilaterally, no wheezing, rales,rhonchi or crepitation. No use of accessory muscles of respiration.  CARDIOVASCULAR: S1, S2 normal. No  murmurs, rubs, or gallops.  ABDOMEN: Soft, nontender, nondistended. Bowel sounds present. No organomegaly or mass.  EXTREMITIES: No pedal edema, cyanosis, or clubbing.  NEUROLOGIC: Cranial nerves II through XII are intact. Muscle strength 5/5 in all extremities. Sensation intact. Gait not checked. Global weakness noted PSYCHIATRIC: The patient is alert and oriented x 3.  SKIN: No obvious rash, lesion, or ulcer.   DATA REVIEW:   CBC  Recent Labs Lab 01/13/16 0456  WBC 5.6  HGB 9.5*  HCT 28.6*  PLT 20*    Chemistries   Recent Labs Lab 01/13/16 0456  NA 139  K 3.8  CL 104  CO2 30  GLUCOSE 101*  BUN 11  CREATININE 0.70  CALCIUM 8.5*     Microbiology Results  Results for orders placed or performed during the hospital encounter of 01/06/16  CULTURE, BLOOD (ROUTINE X 2) w Reflex to ID Panel     Status: None   Collection Time: 01/07/16  9:48 PM  Result Value Ref Range Status   Specimen Description BLOOD  RIGHT AC  Final   Special Requests   Final    BOTTLES DRAWN AEROBIC AND ANAEROBIC  ANA 12ML AER 10ML   Culture NO GROWTH 5 DAYS  Final   Report Status 01/12/2016 FINAL  Final  CULTURE, BLOOD (ROUTINE X 2) w Reflex to ID Panel     Status: None   Collection Time: 01/07/16  9:48 PM  Result Value Ref Range Status   Specimen Description BLOOD  RIGHT HAND  Final   Special Requests   Final    BOTTLES DRAWN AEROBIC AND ANAEROBIC  ANA 9ML AER 10ML   Culture NO GROWTH 5 DAYS  Final   Report Status 01/12/2016 FINAL  Final  C difficile quick scan w PCR reflex     Status: Abnormal   Collection Time: 01/08/16 10:36 AM  Result Value Ref Range Status   C Diff antigen POSITIVE (A) NEGATIVE Final   C Diff toxin NEGATIVE NEGATIVE Final   C Diff interpretation Results are indeterminate. See PCR results.  Final  Urine culture     Status: Abnormal   Collection Time: 01/08/16 10:36 AM  Result Value Ref Range Status   Specimen Description URINE, CLEAN CATCH  Final   Special Requests  NONE  Final   Culture MULTIPLE SPECIES PRESENT, SUGGEST RECOLLECTION (A)  Final   Report Status 01/09/2016 FINAL  Final  Clostridium Difficile by PCR     Status: Abnormal   Collection Time: 01/08/16 10:36 AM  Result Value Ref Range Status   Toxigenic C Difficile by pcr POSITIVE (A) NEGATIVE Final    Comment: Positive for toxigenic C. difficile with little to no toxin production. Only treat if clinical presentation suggests symptomatic illness.   *Note: Due to a large number of results and/or encounters for the requested time period, some results have not been displayed. A complete set of results can be found in Results Review.    RADIOLOGY:  No results found.   Management plans discussed with the patient, family and they are in agreement.  CODE STATUS:  Code Status History    Date Active Date Inactive Code Status Order ID Comments User Context   01/07/2016  2:37 AM 01/13/2016  7:35 PM Full Code [id number]  Harvie Bridge, DO Inpatient   12/11/2015  3:36 PM 12/16/2015  6:11 PM Full Code [id number]  Albertine Patricia, MD Inpatient   01/25/2015  2:40 PM 01/27/2015  3:56 PM Full Code [id number]  Peter M Martinique, MD Inpatient   01/23/2015  6:20 PM 01/25/2015  2:40 PM Full Code [id number]  Cristal Ford, DO Inpatient   01/21/2015  9:04 AM 01/22/2015  5:31 PM Full Code [id number]  Gladstone Lighter, MD Inpatient   01/18/2015  8:27 AM 01/20/2015  9:19 PM Full Code [id number]  [name], MD Inpatient   12/28/2014  8:51 AM 12/30/2014 10:42 PM Full Code [id number]  [name], MD Inpatient   07/07/2014 12:58 PM 07/08/2014  3:22 AM Full Code [id number]  Markus Daft, MD HOV   05/27/2013  3:25 PM 05/28/2013  3:33 AM Full Code [id number]  Arne Cleveland, MD [facility]   05/26/2013  9:57 AM 05/27/2013  3:34 AM Full Code [id number]  Markus Daft, MD HOV   05/03/2013  1:33 PM 05/04/2013  3:41 AM Full Code [id number]  Greggory Keen, MD [facility]      TOTAL TIME TAKING CARE OF THIS PATIENT: 38 minutes.    Gladstone Lighter M.D on  01/14/2016 at 1:56 PM  Between 7am to 6pm - Pager - [phone]  After 6pm go to [email or web address] - Proofreader  [facility]  Office  [phone]  CC: Primary care physician; [name], MD   Note: This dictation was prepared with Dragon dictation along with smaller phrase technology. Any transcriptional errors that result from this process are unintentional.

## 2016-01-15 ENCOUNTER — Telehealth: Payer: Self-pay

## 2016-01-15 ENCOUNTER — Other Ambulatory Visit: Payer: Self-pay | Admitting: Hematology and Oncology

## 2016-01-15 NOTE — Telephone Encounter (Signed)
She has appt here for follow-up and labs this week

## 2016-01-15 NOTE — Telephone Encounter (Signed)
Attempted to reach patient to complete TCM on behalf of PCP. Attempt successful.   Pt is recovering but still experiencing generalized weakness. Per discharge summary pt was instructed to f/u with PCP and oncology due to abnormal platelets.   Pt declined to f/u with PCP at this time and wants platelets checked with oncology labs on 01/17/16.

## 2016-01-16 ENCOUNTER — Ambulatory Visit (HOSPITAL_COMMUNITY)
Admission: RE | Admit: 2016-01-16 | Discharge: 2016-01-16 | Disposition: A | Discharge status: Home / Self Care | Payer: Medicare Other | Source: Ambulatory Visit | Attending: Hematology and Oncology | Admitting: Hematology and Oncology

## 2016-01-16 ENCOUNTER — Encounter (HOSPITAL_COMMUNITY): Payer: Self-pay

## 2016-01-16 ENCOUNTER — Telehealth: Payer: Self-pay | Admitting: Medical Oncology

## 2016-01-16 DIAGNOSIS — C8228 Follicular lymphoma grade III, unspecified, lymph nodes of multiple sites: Secondary | ICD-10-CM

## 2016-01-16 NOTE — Telephone Encounter (Signed)
PET scan not done today because pt ate. Imaging  will try to r/s her in the next few days and will call Dr Randal Buba nurse with the appt. Pt scheduled tomorrow with Dr Alvy Bimler. Note to nurse.

## 2016-01-17 ENCOUNTER — Ambulatory Visit (HOSPITAL_BASED_OUTPATIENT_CLINIC_OR_DEPARTMENT_OTHER): Payer: Medicare Other

## 2016-01-17 ENCOUNTER — Ambulatory Visit: Payer: Medicare Other

## 2016-01-17 ENCOUNTER — Telehealth: Payer: Self-pay | Admitting: Hematology and Oncology

## 2016-01-17 ENCOUNTER — Ambulatory Visit (HOSPITAL_BASED_OUTPATIENT_CLINIC_OR_DEPARTMENT_OTHER): Payer: Medicare Other | Admitting: Hematology and Oncology

## 2016-01-17 ENCOUNTER — Encounter: Payer: Self-pay | Admitting: Hematology and Oncology

## 2016-01-17 ENCOUNTER — Ambulatory Visit: Payer: Medicare Other | Admitting: Nutrition

## 2016-01-17 ENCOUNTER — Other Ambulatory Visit (HOSPITAL_BASED_OUTPATIENT_CLINIC_OR_DEPARTMENT_OTHER): Payer: Medicare Other

## 2016-01-17 VITALS — BP 120/48 | HR 69 | Temp 98.0°F | Resp 16

## 2016-01-17 DIAGNOSIS — Z95828 Presence of other vascular implants and grafts: Secondary | ICD-10-CM

## 2016-01-17 DIAGNOSIS — D801 Nonfamilial hypogammaglobulinemia: Secondary | ICD-10-CM

## 2016-01-17 DIAGNOSIS — C8228 Follicular lymphoma grade III, unspecified, lymph nodes of multiple sites: Secondary | ICD-10-CM

## 2016-01-17 DIAGNOSIS — D61818 Other pancytopenia: Secondary | ICD-10-CM | POA: Diagnosis not present

## 2016-01-17 DIAGNOSIS — I5022 Chronic systolic (congestive) heart failure: Secondary | ICD-10-CM

## 2016-01-17 DIAGNOSIS — A0472 Enterocolitis due to Clostridium difficile, not specified as recurrent: Secondary | ICD-10-CM

## 2016-01-17 DIAGNOSIS — R748 Abnormal levels of other serum enzymes: Secondary | ICD-10-CM

## 2016-01-17 DIAGNOSIS — C8298 Follicular lymphoma, unspecified, lymph nodes of multiple sites: Secondary | ICD-10-CM | POA: Diagnosis not present

## 2016-01-17 DIAGNOSIS — E46 Unspecified protein-calorie malnutrition: Secondary | ICD-10-CM | POA: Diagnosis not present

## 2016-01-17 DIAGNOSIS — E8809 Other disorders of plasma-protein metabolism, not elsewhere classified: Secondary | ICD-10-CM

## 2016-01-17 LAB — CBC WITH DIFFERENTIAL/PLATELET
BASO%: 0.7 % (ref 0.0–2.0)
Basophils Absolute: 0 10*3/uL (ref 0.0–0.1)
EOS%: 0.5 % (ref 0.0–7.0)
Eosinophils Absolute: 0 10*3/uL (ref 0.0–0.5)
HCT: 30.2 % — ABNORMAL LOW (ref 34.8–46.6)
HEMOGLOBIN: 9.7 g/dL — AB (ref 11.6–15.9)
LYMPH#: 2.1 10*3/uL (ref 0.9–3.3)
LYMPH%: 49.9 % — AB (ref 14.0–49.7)
MCH: 31.2 pg (ref 25.1–34.0)
MCHC: 32.1 g/dL (ref 31.5–36.0)
MCV: 97.1 fL (ref 79.5–101.0)
MONO#: 0.9 10*3/uL (ref 0.1–0.9)
MONO%: 20.8 % — ABNORMAL HIGH (ref 0.0–14.0)
NEUT#: 1.2 10*3/uL — ABNORMAL LOW (ref 1.5–6.5)
NEUT%: 28.1 % — AB (ref 38.4–76.8)
Platelets: 37 10*3/uL — ABNORMAL LOW (ref 145–400)
RBC: 3.11 10*6/uL — AB (ref 3.70–5.45)
RDW: 23.5 % — ABNORMAL HIGH (ref 11.2–14.5)
WBC: 4.2 10*3/uL (ref 3.9–10.3)
nRBC: 1 % — ABNORMAL HIGH (ref 0–0)

## 2016-01-17 LAB — COMPREHENSIVE METABOLIC PANEL
ALT: 36 U/L (ref 0–55)
AST: 53 U/L — AB (ref 5–34)
Albumin: 2.8 g/dL — ABNORMAL LOW (ref 3.5–5.0)
Alkaline Phosphatase: 381 U/L — ABNORMAL HIGH (ref 40–150)
Anion Gap: 10 mEq/L (ref 3–11)
BUN: 9.5 mg/dL (ref 7.0–26.0)
CHLORIDE: 106 meq/L (ref 98–109)
CO2: 23 mEq/L (ref 22–29)
Calcium: 9.1 mg/dL (ref 8.4–10.4)
Creatinine: 0.8 mg/dL (ref 0.6–1.1)
EGFR: 77 mL/min/{1.73_m2} — ABNORMAL LOW (ref >90)
GLUCOSE: 105 mg/dL (ref 70–140)
POTASSIUM: 3.6 meq/L (ref 3.5–5.1)
SODIUM: 139 meq/L (ref 136–145)
Total Bilirubin: 0.79 mg/dL (ref 0.20–1.20)
Total Protein: 6 g/dL — ABNORMAL LOW (ref 6.4–8.3)

## 2016-01-17 MED ORDER — SODIUM CHLORIDE 0.9 % IJ SOLN
10.0000 mL | INTRAMUSCULAR | Status: DC | PRN
  Administered 2016-01-17: 10 mL via INTRAVENOUS
  Filled 2016-01-17: qty 10

## 2016-01-17 MED ORDER — ACETAMINOPHEN 325 MG PO TABS
ORAL_TABLET | ORAL | Status: AC
  Filled 2016-01-17: qty 2

## 2016-01-17 MED ORDER — HEPARIN SOD (PORK) LOCK FLUSH 100 UNIT/ML IV SOLN
500.0000 [IU] | Freq: Once | INTRAVENOUS | Status: AC | PRN
  Administered 2016-01-17: 500 [IU] via INTRAVENOUS
  Filled 2016-01-17: qty 5

## 2016-01-17 MED ORDER — IMMUNE GLOBULIN (HUMAN) 10 GM/100ML IV SOLN
1.0000 g/kg | Freq: Once | INTRAVENOUS | Status: AC
  Administered 2016-01-17: 105 g via INTRAVENOUS
  Filled 2016-01-17: qty 50

## 2016-01-17 MED ORDER — ACETAMINOPHEN 325 MG PO TABS
650.0000 mg | ORAL_TABLET | Freq: Once | ORAL | Status: AC
  Administered 2016-01-17: 650 mg via ORAL

## 2016-01-17 MED ORDER — DIPHENHYDRAMINE HCL 25 MG PO CAPS
ORAL_CAPSULE | ORAL | Status: AC
  Filled 2016-01-17: qty 1

## 2016-01-17 MED ORDER — DIPHENHYDRAMINE HCL 25 MG PO TABS
25.0000 mg | ORAL_TABLET | Freq: Once | ORAL | Status: AC
  Administered 2016-01-17: 25 mg via ORAL
  Filled 2016-01-17: qty 1

## 2016-01-17 MED ORDER — ALTEPLASE 2 MG IJ SOLR
2.0000 mg | Freq: Once | INTRAMUSCULAR | Status: DC | PRN
  Filled 2016-01-17: qty 2

## 2016-01-17 NOTE — Telephone Encounter (Signed)
Labs scheduled for 01/24/16 @ 8:30 a.m. Per Dr Alvy Bimler 01/17/16 los. Follow up apointment with Dr Alvy Bimler, scheduled for 01/24/16 @ 12 noon for 30 minutes, per Dr Alvy Bimler, 01/17/16 los.

## 2016-01-17 NOTE — Telephone Encounter (Signed)
Appointments scheduled per 01/17/16 los. A copy of the AVS report and appointment schedule was given to the patient, per 01/17/16 los.

## 2016-01-17 NOTE — Patient Instructions (Signed)

## 2016-01-17 NOTE — Assessment & Plan Note (Signed)
This is secondary to poor oral intake, recurrent infection and recent hospitalization. I encouraged the patient to increase oral intake as tolerated

## 2016-01-17 NOTE — Progress Notes (Signed)
Patient was identified to be at risk for malnutrition on the MST.  75 year old female diagnosed with lymphoma.  She is a patient of Dr. Alvy Bimler.  Past medical history includes PVD, osteoarthritis, hypothyroidism, GERD, depression, colon cancer 2011 and C. difficile colitis.  Medications incllude Dulcolax, calcium with vitamin D, Colace, Zofran, Protonix, MiraLAX, Compazine, Senokot-S, and Zocor.  Labs were reviewed.  Height: 5 feet 5 inches. Weight: 227 pounds, December 7. Usual body weight: 250 pounds. BMI: 37.77.  Patient was discharged from the hospital the end of November. Her weight has been stable the past 2-1/2 weeks. She denies nausea, vomiting, constipation, and diarrhea. She is drinking 2-3 bottles strawberry ensure enlive. She has no nutrition complaints.  Nutrition diagnosis:  Unintentional weight loss related to inadequate oral intake as evidenced by 23 pound weight loss from usual body weight.  Intervention: Reviewed importance of high-calorie, high-protein foods for weight maintenance. Provided copy of increasing calories and protein and soft moist protein foods. Provided samples of strawberry ensure Enlive and coupons. Recommended patient continue 2-3 bottles daily for weight maintenance. Questions were answered.  Teach back method used.  Contact information was given.  Monitoring, evaluation, goals: Patient will tolerate adequate calories and protein for weight maintenance.  Next visit: Patient to contact me for questions or concerns.    **Disclaimer: This note was dictated with voice recognition software. Similar sounding words can inadvertently be transcribed and this note may contain transcription errors which may not have been corrected upon publication of note.**

## 2016-01-17 NOTE — Progress Notes (Signed)
[facility] OFFICE PROGRESS NOTE  Patient Care Team: Abner Greenspan, MD as PCP - General Mosetta Anis, MD (Allergy) Fanny Skates, MD as Consulting Physician (General Surgery) Grace Isaac, MD as Consulting Physician (Cardiothoracic Surgery) Troy Sine, MD as Consulting Physician (Cardiology) Lonn Georgia, PA-C as Physician Assistant (Cardiology) [name], MD as Consulting Physician (Hematology and Oncology) Estill Cotta, MD as Consulting Physician (Ophthalmology)  SUMMARY OF ONCOLOGIC HISTORY: Oncology History   History of colon cancer   Staging form: Colon and Rectum, AJCC 7th Edition     Clinical: Stage IIA (T3, N0, M0) - Signed by [name], MD on 02/21/2014 History of hodgkin's lymphoma   Staging form: Lymphoid Neoplasms, AJCC 6th Edition     Clinical: Stage IV - Signed by [name], MD on 02/21/2014       History of Hodgkin's lymphoma   05/20/2013 Initial Diagnosis    Hodgkin lymphoma      06/27/2014 Imaging    Interval increase in metabolic activity of several small lymph nodes is concerning for lymphoma recurrence. Lymph nodes include a small right level 2 lymph node, right hilar lymph node, and right paratracheal lymph node      07/07/2014 Pathology Results     limited tissue from ultrasound-guided biopsy was nondiagnostic      07/07/2014 Procedure     ultrasound-guided biopsy was nondiagnostic      09/27/2014 Imaging    Repeat PET CT scan show diffuse disease concern for relapse.      10/17/2014 Pathology Results    Accession: [id number] Biopsy was nondiagnostic      10/17/2014 Procedure    She underwent bronchoscopy & EBUS & biopsy of  LN at Level 10L      11/01/2014 Surgery    She underwent cronchoscopy with endobronchial ultrasound, mediastinoscopy and biopsy       11/01/2014 Pathology Results    Accession: [id number] biopsy showed no evidence of lymphoma, only granulomas.      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 ([place])      12/13/2014 Imaging    PET scan showed diffuse disease      12/28/2014 - 12/30/2014 Hospital Admission    She was admitted to the hospital for cycle one of R-ICE      01/18/2015 - 01/20/2015 Chemotherapy    She was admitted to the hospital for cycle 2 of R-ICE      01/20/2015 - 01/22/2015 Hospital Admission    She was then admitted to [facility] with respiratory failure/fluid overload and was noted to have mild cardiomyopathy      01/23/2015 - 01/27/2015 Hospital Admission    she was admitted to the hospital with new onset atrial fibrillation with rapid ventricular response and was found to have evidence of myocardial ischemia. She was discharged on  medical management and oral anticoagulation therapy      01/25/2015 Procedure    Left heart cath showed non-obstructive lesions: Prox RCA lesion, 40% stenosed.   Prox LAD to Mid LAD lesion, 30% stenosed. Ost LM lesion, 25% stenosed      01/30/2015 Imaging    PET Ct scan showed near complete resolution of all disease      03/12/2015 - 09/10/2015 Chemotherapy    She received 3 doses of Rituximab every other month      07/10/2015 PET scan    PET showed Improved metabolic activity in the bony lesions with mild persistent hilar LN with increased SUV uptake      09/06/2015 Imaging    Ct scan showed interval development of mild mucosal thickening involving left  maxillary and bilateral ethmoid sinuses consistent with mild sinusitis.      12/10/2015 - 12/16/2015 Hospital Admission    She was admitted to the hospital due to diarrhea and colitis and was found to have Clostridium difficile      01/06/2016 - 01/13/2016 Hospital Admission    She had recurrent hospitalization for weakness, pancytopenia and persistent C Difficile infection       INTERVAL HISTORY: Please see below for problem oriented charting. She returns today for further follow-up. Unfortunately, PET CT scan is rescheduled to next week. She denies recent diarrhea. No recent fever or chills. She complained of profound weakness. No recent nausea or vomiting. The patient denies any recent signs or symptoms of bleeding such as spontaneous epistaxis, hematuria or hematochezia. She has shortness of breath on minimal exertion. No recent cough  REVIEW OF SYSTEMS:   Constitutional: Denies fevers, chills or abnormal weight loss Eyes: Denies blurriness of vision Ears, nose, mouth, throat, and face: Denies mucositis or sore throat Respiratory: Denies cough, dyspnea or wheezes Cardiovascular: Denies palpitation, chest discomfort  Gastrointestinal:  Denies nausea, heartburn or change in bowel habits Skin: Denies abnormal skin rashes Lymphatics: Denies new lymphadenopathy or easy bruising Neurological:Denies numbness, tingling or new weaknesses Behavioral/Psych: Mood is stable, no new changes  All other systems were reviewed with the patient and are negative.  I have reviewed the past medical history, past surgical history, social history and family history with the patient and they are unchanged from previous note.  ALLERGIES:  is allergic to achromycin [tetracycline hcl]; allopurinol; astelin [azelastine hcl]; cephalexin; codeine; lisinopril; meloxicam; minocycline; nabumetone; nyquil [pseudoeph-doxylamine-dm-apap]; sulfa antibiotics; zolpidem tartrate; buspar [buspirone hcl]; and  ciprofloxacin.  MEDICATIONS:  Current Outpatient Prescriptions  Medication Sig Dispense Refill  . acetaminophen (TYLENOL) 650 MG CR tablet Take 650 mg by mouth every 8 (eight) hours as needed for pain or fever.     Marland Kitchen acidophilus (RISAQUAD) CAPS capsule Take 1 capsule by mouth 3 (three) times daily. X 10 days 30 capsule 0  . acyclovir (ZOVIRAX) 400 MG tablet Take 1 tablet (400 mg total) by mouth daily. 90 tablet 3  . Albuterol Sulfate (PROAIR RESPICLICK) 297 (90 Base) MCG/ACT AEPB Inhale 1-2 puffs into the lungs every 6 (six) hours as needed (SOB, wheezing).     Marland Kitchen BREO ELLIPTA 200-25 MCG/INH AEPB Inhale 1 puff into the lungs every evening.     . Calcium Carbonate-Vitamin D (CALCIUM 600+D) 600-400 MG-UNIT per tablet Take 1 tablet by mouth every evening.     . Cholecalciferol (VITAMIN D-3) 1000 UNITS CAPS Take 1,000 Units by mouth daily.    Marland Kitchen estradiol (ESTRACE) 1 MG tablet Take 1 tablet (1 mg total) by mouth daily. 90 tablet  3  . fexofenadine (ALLEGRA) 180 MG tablet TAKE 1 TABLET (180 MG TOTAL) BY MOUTH DAILY. (*OTC)  3  . FLUoxetine (PROZAC) 20 MG capsule Take 1 capsule (20 mg total) by mouth daily. 90 capsule 3  . furosemide (LASIX) 20 MG tablet Take 1 tablet (20 mg total) by mouth daily. Take 1 tab by mouth daily for weight gain. 90 tablet 3  . gabapentin (NEURONTIN) 300 MG capsule Take 1 capsule (300 mg total) by mouth 2 (two) times daily. 180 capsule 3  . levothyroxine (SYNTHROID, LEVOTHROID) 112 MCG tablet Take 1 tablet (112 mcg total) by mouth daily before breakfast. 90 tablet 3  . lidocaine-prilocaine (EMLA) cream Apply 1 application topically as needed (For port-a-cath.). Reported on 02/22/2015    . LORazepam (ATIVAN) 0.5 MG tablet Take 1 tablet (0.5 mg total) by mouth every 12 (twelve) hours as needed for anxiety. 15 tablet 0  . losartan (COZAAR) 25 MG tablet Take 1 tablet (25 mg total) by mouth daily. 90 tablet 3  . metoprolol (LOPRESSOR) 50 MG tablet TAKE 1 TABLET (50 MG TOTAL) BY MOUTH  2 (TWO) TIMES DAILY. 180 tablet 3  . Omega-3 350 MG CAPS Take 1 capsule by mouth daily. Reported on 02/22/2015    . omeprazole (PRILOSEC) 40 MG capsule     . ondansetron (ZOFRAN) 8 MG tablet Take 8 mg by mouth every 6 (six) hours as needed for nausea or vomiting. Reported on 03/05/2015    . prochlorperazine (COMPAZINE) 10 MG tablet Take 10 mg by mouth every 6 (six) hours as needed for nausea or vomiting. Reported on 03/05/2015    . simvastatin (ZOCOR) 40 MG tablet Take 1 tablet (40 mg total) by mouth at bedtime. 90 tablet 3  . vancomycin (VANCOCIN) 50 mg/mL oral solution Take 10 mLs (500 mg total) by mouth every 6 (six) hours. X 12 more days 500 mL 0   No current facility-administered medications for this visit.    Facility-Administered Medications Ordered in Other Visits  Medication Dose Route Frequency Provider Last Rate Last Dose  . alteplase (CATHFLO ACTIVASE) injection 2 mg  2 mg Intracatheter Once PRN [name], MD      . heparin lock flush 100 unit/mL  500 Units Intravenous Once PRN [name], MD      . Immune Globulin 10% (PRIVIGEN) IV infusion 105 g  1 g/kg Intravenous Once [name], MD      . sodium chloride 0.9 % injection 10 mL  10 mL Intravenous PRN [name], MD   10 mL at 12/20/15 1436  . sodium chloride 0.9 % injection 10 mL  10 mL Intravenous PRN [name], MD   10 mL at 01/17/16 0900  . sodium chloride 0.9 % injection 10 mL  10 mL Intravenous PRN [name], MD        PHYSICAL EXAMINATION: ECOG PERFORMANCE STATUS: 2 - Symptomatic, <50% confined to bed  Vitals:   01/17/16 0927  BP: (!) 118/59  Pulse: 78  Resp: 14  Temp: 98.4 F (36.9 C)   Filed Weights   01/17/16 0927  Weight: 227 lb (103 kg)    GENERAL:alert, no distress and comfortable. She is moderately obese SKIN: skin color is pale, texture, turgor are normal, no rashes or significant lesions EYES: normal, Conjunctiva are pink and non-injected, sclera clear OROPHARYNX:no exudate, no erythema and lips,  buccal mucosa, and tongue normal  NECK: supple, thyroid normal size, non-tender, without nodularity LYMPH:  no palpable lymphadenopathy in the cervical,  axillary or inguinal LUNGS: clear to auscultation and percussion with normal breathing effort HEART: regular rate & rhythm and no murmurs with mild bilateral lower extremity edema ABDOMEN:abdomen soft, non-tender and normal bowel sounds Musculoskeletal:no cyanosis of digits and no clubbing  NEURO: alert & oriented x 3 with fluent speech, no focal motor/sensory deficits  LABORATORY DATA:  I have reviewed the data as listed    Component Value Date/Time   NA 139 01/17/2016 0834   K 3.6 01/17/2016 0834   CL 104 01/13/2016 0456   CL 105 07/14/2012 0939   CO2 23 01/17/2016 0834   GLUCOSE 105 01/17/2016 0834   GLUCOSE 103 (H) 07/14/2012 0939   BUN 9.5 01/17/2016 0834   CREATININE 0.8 01/17/2016 0834   CALCIUM 9.1 01/17/2016 0834   PROT 6.0 (L) 01/17/2016 0834   ALBUMIN 2.8 (L) 01/17/2016 0834   AST 53 (H) 01/17/2016 0834   ALT 36 01/17/2016 0834   ALKPHOS 381 (H) 01/17/2016 0834   BILITOT 0.79 01/17/2016 0834   GFRNONAA >60 01/13/2016 0456   GFRAA >60 01/13/2016 0456    No results found for: SPEP, UPEP  Lab Results  Component Value Date   WBC 4.2 01/17/2016   NEUTROABS 1.2 (L) 01/17/2016   HGB 9.7 (L) 01/17/2016   HCT 30.2 (L) 01/17/2016   MCV 97.1 01/17/2016   PLT 37 (L) 01/17/2016      Chemistry      Component Value Date/Time   NA 139 01/17/2016 0834   K 3.6 01/17/2016 0834   CL 104 01/13/2016 0456   CL 105 07/14/2012 0939   CO2 23 01/17/2016 0834   BUN 9.5 01/17/2016 0834   CREATININE 0.8 01/17/2016 0834      Component Value Date/Time   CALCIUM 9.1 01/17/2016 0834   ALKPHOS 381 (H) 01/17/2016 0834   AST 53 (H) 01/17/2016 0834   ALT 36 01/17/2016 0834   BILITOT 0.79 01/17/2016 0834       ASSESSMENT & PLAN:  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 Recent imaging study was suspicious for cancer recurrence. PET/CT scan has been rescheduled to next week and was see her back afterwards to review test results.  With her current pancytopenia, she is not a candidate for treatment until pancytopenia results  Hypogammaglobulinemia, acquired ([place]) I suspect she has acquired hypogammaglobulinemia from prior treatment causing recurrent, unresolved upper respiratory tract infection. I discussed the importance of discontinuation of rituximab and to consider monthly IVIG treatment. The risks, benefit, side effects of IVIG treatment including risk of allergic reaction, hives, itching, serum sickness were discussed and she agreed to proceed. I would dose her at 1 g/kg once every 4 weeks I recommend we continue treatment as scheduled  C. difficile colitis She has recent C. difficile colitis secondary to recurrent antibiotic therapy for her chronic sinusitis I recommend she continue antibiotic treatment as directed   Pancytopenia, acquired ([place]) This is related to recent infection and her oral antibiotic therapy Platelet count is improving. She is not symptomatic. I will continue to observe  Hypoalbuminemia due to protein-calorie malnutrition ([place]) This is secondary to poor oral intake, recurrent infection and recent hospitalization. I encouraged the patient to increase oral intake as tolerated  Chronic systolic CHF (congestive heart failure), NYHA class 2 ([place]) She has no evidence of fluid overload or recent exacerbation of congestive heart failure. She will continue close monitoring and follow-up with cardiologist    Elevated liver enzymes Multifactorial,  likely related to recent infection and antibiotic therapy. Continue close observation. We plan to re-stage with PET CT scan as above   No orders of the defined types were placed in this encounter.  All questions were answered. The patient knows to call the clinic with any  problems, questions or concerns. No barriers to learning was detected. I spent 30 minutes counseling the patient face to face. The total time spent in the appointment was 40 minutes and more than 50% was on counseling and review of test results     [name], MD 01/17/2016 10:13 AM

## 2016-01-17 NOTE — Assessment & Plan Note (Signed)
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 I discussed the importance of discontinuation of rituximab and to consider monthly IVIG treatment. The risks, benefit, side effects of IVIG treatment including risk of allergic reaction, hives, itching, serum sickness were discussed and she agreed to proceed. I would dose her at 1 g/kg once every 4 weeks I recommend we continue treatment as scheduled

## 2016-01-17 NOTE — Assessment & Plan Note (Signed)
She has no evidence of fluid overload or recent exacerbation of congestive heart failure. She will continue close monitoring and follow-up with cardiologist

## 2016-01-17 NOTE — Assessment & Plan Note (Signed)
This is related to recent infection and her oral antibiotic therapy Platelet count is improving. She is not symptomatic. I will continue to observe

## 2016-01-17 NOTE — Patient Instructions (Signed)

## 2016-01-17 NOTE — Assessment & Plan Note (Signed)
She has issues with recurrent infection. Recent imaging study was suspicious for cancer recurrence. PET/CT scan has been rescheduled to next week and was see her back afterwards to review test results.  With her current pancytopenia, she is not a candidate for treatment until pancytopenia results

## 2016-01-17 NOTE — Assessment & Plan Note (Signed)
She has recent C. difficile colitis secondary to recurrent antibiotic therapy for her chronic sinusitis I recommend she continue antibiotic treatment as directed

## 2016-01-17 NOTE — Assessment & Plan Note (Signed)
Multifactorial, likely related to recent infection and antibiotic therapy. Continue close observation. We plan to re-stage with PET CT scan as above

## 2016-01-23 ENCOUNTER — Ambulatory Visit (INDEPENDENT_AMBULATORY_CARE_PROVIDER_SITE_OTHER): Payer: Medicare Other | Admitting: Allergy & Immunology

## 2016-01-23 ENCOUNTER — Encounter: Payer: Self-pay | Admitting: Allergy & Immunology

## 2016-01-23 VITALS — BP 128/60 | HR 75 | Temp 99.1°F | Resp 22 | Ht 64.0 in | Wt 222.4 lb

## 2016-01-23 DIAGNOSIS — I2510 Atherosclerotic heart disease of native coronary artery without angina pectoris: Secondary | ICD-10-CM

## 2016-01-23 DIAGNOSIS — J4540 Moderate persistent asthma, uncomplicated: Secondary | ICD-10-CM

## 2016-01-23 DIAGNOSIS — J300 Vasomotor rhinitis: Secondary | ICD-10-CM | POA: Diagnosis not present

## 2016-01-23 DIAGNOSIS — K219 Gastro-esophageal reflux disease without esophagitis: Secondary | ICD-10-CM | POA: Diagnosis not present

## 2016-01-23 NOTE — Patient Instructions (Addendum)
1. Moderate persistent asthma, uncomplicated - Lung function was slightly lower today, although this is likely related to her underlying weakness and technique. - We will continue with the same medications for now. - Daily controller medication(s): Breo 200/25 one inhalation daily - Rescue medications: ProAir 4 puffs every 4-6 hours as needed - Asthma control goals:  * Full participation in all desired activities (may need albuterol before activity) * Albuterol use two time or less a week on average (not counting use with activity) * Cough interfering with sleep two time or less a month * Oral steroids no more than once a year * No hospitalizations  2. Chronic vasomotor rhinitis - Stop the Dymista and the Flonase since you are having increased nose bleeds from this. - Start nasal saline rinses twice daily to clear up mucous as well as keep the nasal passages clear. - Stop Allegra and start Xyzal '5mg'$  tablet once daily to help with nasal symptoms.   3. Gastroesophageal reflux disease, esophagitis presence not specified - Restart your reflux medications after you have finished the vancomycin.  - Uncontrolled reflux can worsen your asthma symptoms.  4. Return in about 4 months (around 05/23/2016).  Please inform us of any Emergency Department visits, hospitalizations, or changes in symptoms. Call us before going to the ED for breathing or allergy symptoms since we might be able to fit you in for a sick visit. Feel free to contact us anytime with any questions, problems, or concerns.  It was a pleasure to see you and your family again today! Have a wonderful holiday season!   Websites that have reliable patient information: 1. American Academy of Asthma, Allergy, and Immunology: www.aaaai.org 2. Food Allergy Research and Education (FARE): foodallergy.org 3. Mothers of Asthmatics: http://www.asthmacommunitynetwork.org 4. American College of Allergy, Asthma, and Immunology: www.acaai.org

## 2016-01-23 NOTE — Progress Notes (Signed)
FOLLOW UP  Date of Service/Encounter:  01/23/16   Assessment:   Moderate persistent asthma, uncomplicated  Chronic vasomotor rhinitis  Gastroesophageal reflux disease, esophagitis presence not specified   Asthma Reportables:  Severity: moderate persistent  Risk: low Control: not well controlled  Seasonal Influenza Vaccine: yes     Plan/Recommendations:   1. Moderate persistent asthma, uncomplicated - Lung function was slightly lower today, although this is likely related to her underlying weakness and technique.  - We will continue with the same medications for now. - Daily controller medication(s): Breo 200/25 one inhalation daily  - Rescue medications: ProAir 4 puffs every 4-6 hours as needed - Asthma control goals:  * Full participation in all desired activities (may need albuterol before activity) * Albuterol use two time or less a week on average (not counting use with activity) * Cough interfering with sleep two time or less a month * Oral steroids no more than once a year * No hospitalizations  2. Chronic vasomotor rhinitis - Stop the Dymista and the Flonase since you are having increased nose bleeds from this. - Start nasal saline rinses twice daily to clear up mucous as well as keep the nasal passages clear. - Stop Allegra and start Xyzal '5mg'$  tablet once daily to help with nasal symptoms.   3. Gastroesophageal reflux disease - Restart your reflux medications after you have finished the vancomycin.  - Uncontrolled reflux can worsen your asthma symptoms.  4. Return in about 4 months (around 05/23/2016).   Subjective:   Diamond Boyd is a 75 y.o. female presenting today for follow up of  Chief Complaint  Patient presents with  . Follow-up    in the hospital first week in nov. and last week in nov.  . Asthma    lingering cough- sometimes hear rattling when cough  .  Diamond Boyd has a history of the following: Patient Active Problem List     Diagnosis Date Noted  . Pancytopenia, acquired ([place]) 01/17/2016  . Dehydration 12/31/2015  . Neoplastic (malignant) related fatigue 12/31/2015  . Unintentional weight loss 12/31/2015  . Epistaxis 12/31/2015  . Hypoalbuminemia due to protein-calorie malnutrition ([place]) 12/31/2015  . C. difficile colitis   . Colitis 12/11/2015  . Basal cell carcinoma 10/31/2015  . Neuropathy ([place]) 10/31/2015  . S/P cataract surgery 10/31/2015  . Chronic rhinitis 10/31/2015  . Moderate persistent asthma 10/31/2015  . Physical deconditioning 10/16/2015  . Chronic ethmoidal sinusitis 09/18/2015  . 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 Cough 07/18/2015  . Port catheter in place 07/11/2015  . Generalized weakness 05/11/2015  . Benign essential HTN   . PAF (paroxysmal atrial fibrillation) ([place])   . Internal hemorrhoids 02/21/2015  . Rectal bleeding 02/06/2015  . 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 Thrombocytopenia ([place]) 01/29/2015  . AKI (acute kidney injury) ([place])   . Elevated troponin   . Atrial fibrillation with rapid ventricular response ([place]) 01/23/2015  . New onset a-fib ([place]) 01/23/2015  . Hypertension 01/23/2015  . CHF (congestive heart failure) ([place]) 01/21/2015  . Chronic systolic CHF (congestive heart failure), NYHA class 2 ([place]) 01/11/2015  . Fever in adult 01/10/2015  . Immunocompromised status associated with infection ([place]) 01/08/2015  . 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 Preventive measure 11/14/2014  . Parotid mass 11/14/2014  . Chronic lower back pain 09/28/2014  . Mediastinal lymphadenopathy 09/27/2014  .  Neuropathy due to chemotherapeutic drug ([place]) 06/19/2014  . Peripheral neuropathy due to chemotherapy ([place]) 04/03/2014  . Trochanteric bursitis of right hip 04/03/2014  . Anemia in chronic illness 02/21/2014  . Elevated liver enzymes  02/21/2014  . Urinary tract infection 11/18/2013  . Candidal intertrigo 09/23/2013  . Atrophic vaginitis 09/23/2013  . Drug-induced neutropenia ([place]) 09/01/2013  . History of colon cancer 06/17/2013  . History of Hodgkin's lymphoma 05/20/2013  . Conjunctivitis, acute 04/27/2013  . Cold sore 04/27/2013  . Hyperglycemia 03/11/2013  . Hodgkin lymphoma ([place]) 02/10/2013  . Acute maxillary sinusitis 02/09/2013  . Other malaise and fatigue 01/04/2013  . Shingles 11/22/2012  . Cystocele 09/22/2012  . Encounter for Medicare annual wellness exam 09/17/2012  . Left knee pain 09/10/2012  . Thoracic back pain 06/01/2012  . Skin lesion of face 05/17/2012  . Other screening mammogram 07/22/2011  . Routine gynecological examination 07/22/2011  . Colon cancer screening 07/22/2011  . History of anemia 05/06/2011  . 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 Hypothyroid 02/24/2011  . Varicose veins 02/24/2011  . Obesity 11/20/2010  . Colon cancer ([place]) 08/23/2009  . Hyperphosphatemia 08/09/2009  . Chronic maxillary sinusitis 07/03/2009  . THROAT PAIN, CHRONIC 07/03/2009  . S/P colon resection 02/10/2009  . GERD 12/20/2008  . Anxiety state 08/22/2008  . MENOPAUSAL SYNDROME 07/13/2007  . High cholesterol 07/08/2006  . Allergic rhinitis 07/08/2006  . OVERACTIVE BLADDER 07/08/2006  . 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 HYPOTHYROIDISM 06/25/2006  . Depression 06/25/2006  . PERIPHERAL VASCULAR DISEASE 06/25/2006  . OSTEOARTHRITIS 06/25/2006  . LEG EDEMA 06/25/2006  . SKIN CANCER, HX OF 06/25/2006  . COLONIC POLYPS, HX OF 06/25/2006    History obtained from: chart review and patient.  Diamond Boyd was referred by [name], MD.     Diamond Boyd is a 75 y.o. female presenting for a follow up visit. Diamond Boyd was last seen in September 2017. At that time, we changed her to [facility] 200/5 two puffs twice daily with a spacer. We continued her on domestic one spray per nostril twice daily. She has a  history of acquired hypogammaglobulinemia and plan to keep her on IVIG through the winter. She underwent a penicillin drug test and challenge successfully in August 2017.  Since the last visit, she has not done well. Shortly after Thanksgiving, she had significant nosebleeds and was found to have a platelet count of 13. Therefore, she was admitted to the hospital for a couple of platelet transfusions. She remained in the hospital for 5 days in total. She was discharged and then readmitted for chronic diarrhea. She was found to have C. difficile colitis. She was admitted for 7 days and remains on vancomycin today, which continues through the beginning of next week. Following these 2 hospitalizations, she remains weak and unsteady. Her husband reports that she has lost 25 pounds since Thanksgiving. Her oncologist thinks this might be related to her underlying chronic lymphoma. She is set to undergo a PET scan tomorrow along with blood work. 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  From an asthma perspective she remains on 3. She was initially placed on Symbicort we first saw her, but was having no improvement and was switched to [facility]. The [facility] caused him $300 for a co-pay, therefore we substituted Bero 200/25. The patient's husband thinks this works better for her since she was not able to coronary spacer. Despite using the [place], she  has continued to have daily coughing. She does use albuterol at least once per day, with improvement in her coughing. She has coughing both during the day as well as at night. Of note, she does have a history of reflux and was on a proton pump inhibitor that was stopped during her hospitalization for C. difficile colitis. She has not restarted her proton pump inhibitor.   She does have postnasal drip which she feels triggers or coughing. She was on Flonase and then Dymista after this, however  she developed nosebleeds. She has not started using her nasal sprays again. She does not have nasal saline. She does feel that she has had increased mucus production for the past 2 days. She is also on Allegra daily, which provides some drying effect. She was followed by Dr. Donneta Romberg in the past and was on immunotherapy for 12 years. She was interested in restarting it with our practice, however her allergy testing was completely negative.  Otherwise, there have been no changes to her past medical history, surgical history, family history, or social history.    Review of Systems: a 14-point review of systems is pertinent for what is mentioned in HPI.  Otherwise, all other systems were negative. Constitutional: negative other than that listed in the HPI Eyes: negative other than that listed in the HPI Ears, nose, mouth, throat, and face: negative other than that listed in the HPI Respiratory: negative other than that listed in the HPI Cardiovascular: negative other than that listed in the HPI Gastrointestinal: negative other than that listed in the HPI Genitourinary: negative other than that listed in the HPI Integument: negative other than that listed in the HPI Hematologic: negative other than that listed in the HPI Musculoskeletal: negative other than that listed in the HPI Neurological: negative other than that listed in the HPI Allergy/Immunologic: negative other than that listed in the HPI    Objective:   Blood pressure 128/60, pulse 75, temperature 99.1 F (37.3 C), temperature source Oral, resp. rate (!) 22, height '5\' 4"'$  (1.626 m), weight 222 lb 6.4 oz (100.9 kg), last menstrual period 02/10/1970, SpO2 95 %. Body mass index is 38.17 kg/m.   Physical Exam:  General: Alert, interactive, in no acute distress.Much more talkative today than previous visits.  Eyes: No conjunctival injection present on the right, No conjunctival injection present on the left, PERRL bilaterally, No  discharge on the right and No discharge on the left Ears: Right TM pearly gray with normal light reflex, Left TM pearly gray with normal light reflex, Right TM intact without perforation and Left TM intact without perforation.  Nose/Throat: External nose within normal limits and septum midline, turbinates edematous with clear discharge, post-pharynx erythematous without cobblestoning in the posterior oropharynx. Tonsils 2+ without exudates Neck: Supple without thyromegaly. Lungs: Clear to auscultation without wheezing, rhonchi or rales. No increased work of breathing. CV: Normal S1/S2, no murmurs. Capillary refill <2 seconds.  Abdomen: Nondistended, nontender. No guarding or rebound tenderness. Bowel sounds faint and present in all fields  Skin: Warm and dry, without lesions or rashes. Neuro:   Grossly intact. No focal deficits appreciated. Responsive to questions.   Diagnostic studies:  Spirometry: results abnormal (FEV1: 1.38/65%, FVC: 1.67/61%, FEV1/FVC: 82%).    Spirometry consistent with possible restrictive disease. Compared to her last visit, both her FEV1 and FVC have decreased slightly. However her technique was subpar today likely secondary to her decreased energy from her recent illness.   Allergy Studies: None  Salvatore Marvel, MD [facility]

## 2016-01-24 ENCOUNTER — Telehealth: Payer: Self-pay | Admitting: Hematology and Oncology

## 2016-01-24 ENCOUNTER — Encounter: Payer: Self-pay | Admitting: Hematology and Oncology

## 2016-01-24 ENCOUNTER — Other Ambulatory Visit: Payer: Self-pay | Admitting: Hematology and Oncology

## 2016-01-24 ENCOUNTER — Other Ambulatory Visit (HOSPITAL_BASED_OUTPATIENT_CLINIC_OR_DEPARTMENT_OTHER): Payer: Medicare Other

## 2016-01-24 ENCOUNTER — Encounter (HOSPITAL_COMMUNITY)
Admission: RE | Admit: 2016-01-24 | Discharge: 2016-01-24 | Disposition: A | Discharge status: Home / Self Care | Payer: Medicare Other | Source: Ambulatory Visit | Attending: Hematology and Oncology | Admitting: Hematology and Oncology

## 2016-01-24 ENCOUNTER — Ambulatory Visit (HOSPITAL_BASED_OUTPATIENT_CLINIC_OR_DEPARTMENT_OTHER): Payer: Medicare Other | Admitting: Hematology and Oncology

## 2016-01-24 VITALS — BP 135/63 | HR 87 | Temp 98.4°F | Resp 18 | Ht 64.0 in | Wt 225.3 lb

## 2016-01-24 DIAGNOSIS — J320 Chronic maxillary sinusitis: Secondary | ICD-10-CM

## 2016-01-24 DIAGNOSIS — D61818 Other pancytopenia: Secondary | ICD-10-CM | POA: Diagnosis not present

## 2016-01-24 DIAGNOSIS — E8809 Other disorders of plasma-protein metabolism, not elsewhere classified: Secondary | ICD-10-CM

## 2016-01-24 DIAGNOSIS — C8590 Non-Hodgkin lymphoma, unspecified, unspecified site: Secondary | ICD-10-CM | POA: Diagnosis not present

## 2016-01-24 DIAGNOSIS — E46 Unspecified protein-calorie malnutrition: Secondary | ICD-10-CM

## 2016-01-24 DIAGNOSIS — C8298 Follicular lymphoma, unspecified, lymph nodes of multiple sites: Secondary | ICD-10-CM | POA: Diagnosis not present

## 2016-01-24 DIAGNOSIS — A0472 Enterocolitis due to Clostridium difficile, not specified as recurrent: Secondary | ICD-10-CM

## 2016-01-24 DIAGNOSIS — C8228 Follicular lymphoma grade III, unspecified, lymph nodes of multiple sites: Secondary | ICD-10-CM | POA: Insufficient documentation

## 2016-01-24 LAB — COMPREHENSIVE METABOLIC PANEL
ALT: 19 U/L (ref 0–55)
ANION GAP: 10 meq/L (ref 3–11)
AST: 28 U/L (ref 5–34)
Albumin: 2.9 g/dL — ABNORMAL LOW (ref 3.5–5.0)
Alkaline Phosphatase: 394 U/L — ABNORMAL HIGH (ref 40–150)
BILIRUBIN TOTAL: 0.89 mg/dL (ref 0.20–1.20)
BUN: 10.4 mg/dL (ref 7.0–26.0)
CO2: 23 meq/L (ref 22–29)
CREATININE: 0.8 mg/dL (ref 0.6–1.1)
Calcium: 9.5 mg/dL (ref 8.4–10.4)
Chloride: 105 mEq/L (ref 98–109)
EGFR: 77 mL/min/{1.73_m2} — ABNORMAL LOW (ref >90)
GLUCOSE: 108 mg/dL (ref 70–140)
Potassium: 3.6 mEq/L (ref 3.5–5.1)
SODIUM: 139 meq/L (ref 136–145)
TOTAL PROTEIN: 7.4 g/dL (ref 6.4–8.3)

## 2016-01-24 LAB — CBC WITH DIFFERENTIAL/PLATELET
BASO%: 0.2 % (ref 0.0–2.0)
Basophils Absolute: 0 10*3/uL (ref 0.0–0.1)
EOS ABS: 0 10*3/uL (ref 0.0–0.5)
EOS%: 0 % (ref 0.0–7.0)
HCT: 33.4 % — ABNORMAL LOW (ref 34.8–46.6)
HGB: 10.9 g/dL — ABNORMAL LOW (ref 11.6–15.9)
LYMPH%: 57.7 % — ABNORMAL HIGH (ref 14.0–49.7)
MCH: 32.3 pg (ref 25.1–34.0)
MCHC: 32.6 g/dL (ref 31.5–36.0)
MCV: 99.1 fL (ref 79.5–101.0)
MONO#: 1.1 10*3/uL — AB (ref 0.1–0.9)
MONO%: 20.4 % — AB (ref 0.0–14.0)
NEUT%: 21.7 % — ABNORMAL LOW (ref 38.4–76.8)
NEUTROS ABS: 1.2 10*3/uL — AB (ref 1.5–6.5)
NRBC: 1 % — AB (ref 0–0)
PLATELETS: 42 10*3/uL — AB (ref 145–400)
RBC: 3.37 10*6/uL — AB (ref 3.70–5.45)
RDW: 23.2 % — AB (ref 11.2–14.5)
WBC: 5.5 10*3/uL (ref 3.9–10.3)
lymph#: 3.2 10*3/uL (ref 0.9–3.3)

## 2016-01-24 LAB — GLUCOSE, CAPILLARY: GLUCOSE-CAPILLARY: 106 mg/dL — AB (ref 65–99)

## 2016-01-24 LAB — LACTATE DEHYDROGENASE: LDH: 190 U/L (ref 125–245)

## 2016-01-24 MED ORDER — FLUDEOXYGLUCOSE F - 18 (FDG) INJECTION
11.0700 | Freq: Once | INTRAVENOUS | Status: AC | PRN
  Administered 2016-01-24: 11.07 via INTRAVENOUS

## 2016-01-24 MED ORDER — PREDNISONE 20 MG PO TABS: 20.0000 mg | ORAL_TABLET | Freq: Every day | ORAL | 0 refills | Status: DC

## 2016-01-24 NOTE — Assessment & Plan Note (Signed)
This is related to recent infection and her oral antibiotic therapy Her CBC is improving. She is not symptomatic. I will continue to observe She does not need blood transfusion today She has no recent bleeding complications

## 2016-01-24 NOTE — Assessment & Plan Note (Signed)
She has recurrent sinusitis again on PET CT scan. She is symptomatic. She is already on antibiotic treatment. I recommend low-dose prednisone therapy to reduce the inflammation and pain

## 2016-01-24 NOTE — Progress Notes (Signed)
[facility] OFFICE PROGRESS NOTE  Patient Care Team: Abner Greenspan, MD as PCP - General Mosetta Anis, MD (Allergy) Fanny Skates, MD as Consulting Physician (General Surgery) Grace Isaac, MD as Consulting Physician (Cardiothoracic Surgery) Troy Sine, MD as Consulting Physician (Cardiology) Lonn Georgia, PA-C as Physician Assistant (Cardiology) [name], MD as Consulting Physician (Hematology and Oncology) Estill Cotta, MD as Consulting Physician (Ophthalmology)  SUMMARY OF ONCOLOGIC HISTORY: Oncology History   History of colon cancer   Staging form: Colon and Rectum, AJCC 7th Edition     Clinical: Stage IIA (T3, N0, M0) - Signed by [name], MD on 02/21/2014 History of hodgkin's lymphoma   Staging form: Lymphoid Neoplasms, AJCC 6th Edition     Clinical: Stage IV - Signed by [name], MD on 02/21/2014       History of Hodgkin's lymphoma   05/20/2013 Initial Diagnosis    Hodgkin lymphoma      06/27/2014 Imaging    Interval increase in metabolic activity of several small lymph nodes is concerning for lymphoma recurrence. Lymph nodes include a small right level 2 lymph node, right hilar lymph node, and right paratracheal lymph node      07/07/2014 Pathology Results     limited tissue from ultrasound-guided biopsy was nondiagnostic      07/07/2014 Procedure     ultrasound-guided biopsy was nondiagnostic      09/27/2014 Imaging    Repeat PET CT scan show diffuse disease concern for relapse.      10/17/2014 Pathology Results    Accession: [id number] Biopsy was nondiagnostic      10/17/2014 Procedure    She underwent bronchoscopy & EBUS & biopsy of  LN at Level 10L      11/01/2014 Surgery    She underwent cronchoscopy with endobronchial ultrasound, mediastinoscopy and biopsy       11/01/2014 Pathology Results    Accession: [id number] biopsy showed no evidence of lymphoma, only granulomas.      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 ([place])      12/13/2014 Imaging    PET scan showed diffuse disease      12/28/2014 - 12/30/2014 Hospital Admission    She was admitted to the hospital for cycle one of R-ICE      01/18/2015 - 01/20/2015 Chemotherapy    She was admitted to the hospital for cycle 2 of R-ICE      01/20/2015 - 01/22/2015 Hospital Admission    She was then admitted to [facility] with respiratory failure/fluid overload and was noted to have mild cardiomyopathy      01/23/2015 - 01/27/2015 Hospital Admission    she was admitted to the hospital with new onset atrial fibrillation with rapid ventricular response and was found to have evidence of myocardial ischemia. She was discharged on  medical management and oral anticoagulation therapy      01/25/2015 Procedure    Left heart cath showed non-obstructive lesions: Prox RCA lesion, 40% stenosed.   Prox LAD to Mid LAD lesion, 30% stenosed. Ost LM lesion, 25% stenosed      01/30/2015 Imaging    PET Ct scan showed near complete resolution of all disease      03/12/2015 - 09/10/2015 Chemotherapy    She received 3 doses of Rituximab every other month      07/10/2015 PET scan    PET showed Improved metabolic activity in the bony lesions with mild persistent hilar LN with increased SUV uptake      09/06/2015 Imaging    Ct scan showed interval development of mild mucosal thickening involving left  maxillary and bilateral ethmoid sinuses consistent with mild sinusitis.      12/10/2015 - 12/16/2015 Hospital Admission    She was admitted to the hospital due to diarrhea and colitis and was found to have Clostridium difficile      01/06/2016 - 01/13/2016 Hospital Admission    She had recurrent hospitalization for weakness, pancytopenia and persistent C Difficile infection      01/24/2016 PET scan    No evidence of hypermetabolic soft tissue lymphoma. 2. Redevelopment of diffuse marrow hypermetabolism which could be due to marrow stimulation by chemotherapy. This makes evaluation and delineation of the previously described focal osseous lesions difficult. A right-sided sternal lesion is not significantly changed. 3. Diffuse thyroid hypermetabolism persists and may represent thyroiditis. 4. Cardiomegaly. Coronary artery atherosclerosis. Aortic atherosclerosis. 5. Possible constipation. 6. New left-sided sinus disease.       INTERVAL HISTORY: Please see below for problem oriented charting. She returns today to review PET/CT result. She complained of acute sinus pain recently with congestion. She denies further diarrhea. The patient denies any recent signs or symptoms of bleeding such as spontaneous epistaxis, hematuria or hematochezia. She is still taking antibiotic for recent C. difficile infection  REVIEW OF SYSTEMS:   Constitutional: Denies fevers, chills or abnormal weight loss Eyes: Denies blurriness of vision Ears, nose, mouth, throat, and face: Denies mucositis or sore throat Respiratory: Denies cough, dyspnea or wheezes Cardiovascular: Denies palpitation, chest discomfort or lower extremity swelling Gastrointestinal:  Denies nausea, heartburn or change in bowel habits Skin: Denies abnormal skin rashes Lymphatics: Denies new lymphadenopathy or easy bruising Neurological:Denies numbness, tingling or new weaknesses Behavioral/Psych: Mood is stable, no new changes  All other  systems were reviewed with the patient and are negative.  I have reviewed the past medical history, past surgical history, social history and family history with the patient and they are unchanged from previous note.  ALLERGIES:  is allergic to achromycin [tetracycline hcl]; allopurinol; astelin [azelastine hcl]; cephalexin; codeine; lisinopril; meloxicam; minocycline; nabumetone; nyquil [pseudoeph-doxylamine-dm-apap]; sulfa antibiotics; zolpidem tartrate; buspar [buspirone hcl]; and ciprofloxacin.  MEDICATIONS:  Current Outpatient Prescriptions  Medication Sig Dispense Refill  . acetaminophen (TYLENOL) 650 MG CR tablet Take 650 mg by mouth every 8 (eight) hours as needed for pain or fever.     Marland Kitchen acidophilus (RISAQUAD) CAPS capsule Take 1 capsule by mouth 3 (three) times daily. X 10 days 30 capsule 0  . acyclovir (ZOVIRAX) 400 MG tablet Take 1 tablet (400 mg total) by mouth daily. 90 tablet 3  . Albuterol Sulfate (PROAIR RESPICLICK) 323 (90 Base) MCG/ACT AEPB Inhale 1-2 puffs into the lungs every 6 (six) hours as needed (SOB, wheezing).     Marland Kitchen BREO ELLIPTA 200-25 MCG/INH AEPB  Inhale 1 puff into the lungs every evening.     . Calcium Carbonate-Vitamin D (CALCIUM 600+D) 600-400 MG-UNIT per tablet Take 1 tablet by mouth every evening.     . Cholecalciferol (VITAMIN D-3) 1000 UNITS CAPS Take 1,000 Units by mouth daily.    Marland Kitchen estradiol (ESTRACE) 1 MG tablet Take 1 tablet (1 mg total) by mouth daily. 90 tablet 3  . fexofenadine (ALLEGRA) 180 MG tablet TAKE 1 TABLET (180 MG TOTAL) BY MOUTH DAILY. (*OTC)  3  . FLUoxetine (PROZAC) 20 MG capsule Take 1 capsule (20 mg total) by mouth daily. 90 capsule 3  . furosemide (LASIX) 20 MG tablet Take 1 tablet (20 mg total) by mouth daily. Take 1 tab by mouth daily for weight gain. 90 tablet 3  . gabapentin (NEURONTIN) 300 MG capsule Take 1 capsule (300 mg total) by mouth 2 (two) times daily. 180 capsule 3  . levothyroxine (SYNTHROID, LEVOTHROID) 112 MCG tablet Take  1 tablet (112 mcg total) by mouth daily before breakfast. 90 tablet 3  . lidocaine-prilocaine (EMLA) cream Apply 1 application topically as needed (For port-a-cath.). Reported on 02/22/2015    . LORazepam (ATIVAN) 0.5 MG tablet Take 1 tablet (0.5 mg total) by mouth every 12 (twelve) hours as needed for anxiety. 15 tablet 0  . losartan (COZAAR) 25 MG tablet Take 1 tablet (25 mg total) by mouth daily. 90 tablet 3  . metoprolol (LOPRESSOR) 50 MG tablet TAKE 1 TABLET (50 MG TOTAL) BY MOUTH 2 (TWO) TIMES DAILY. 180 tablet 3  . Omega-3 350 MG CAPS Take 1 capsule by mouth daily. Reported on 02/22/2015    . omeprazole (PRILOSEC) 40 MG capsule     . ondansetron (ZOFRAN) 8 MG tablet Take 8 mg by mouth every 6 (six) hours as needed for nausea or vomiting. Reported on 03/05/2015    . prochlorperazine (COMPAZINE) 10 MG tablet Take 10 mg by mouth every 6 (six) hours as needed for nausea or vomiting. Reported on 03/05/2015    . simvastatin (ZOCOR) 40 MG tablet Take 1 tablet (40 mg total) by mouth at bedtime. 90 tablet 3  . vancomycin (VANCOCIN) 50 mg/mL oral solution Take 10 mLs (500 mg total) by mouth every 6 (six) hours. X 12 more days 500 mL 0  . predniSONE (DELTASONE) 20 MG tablet Take 1 tablet (20 mg total) by mouth daily with breakfast. 10 tablet 0   No current facility-administered medications for this visit.    Facility-Administered Medications Ordered in Other Visits  Medication Dose Route Frequency Provider Last Rate Last Dose  . sodium chloride 0.9 % injection 10 mL  10 mL Intravenous PRN [name], MD   10 mL at 12/20/15 1436    PHYSICAL EXAMINATION: ECOG PERFORMANCE STATUS: 1 - Symptomatic but completely ambulatory  Vitals:   01/24/16 1144  BP: 135/63  Pulse: 87  Resp: 18  Temp: 98.4 F (36.9 C)   Filed Weights   01/24/16 1144  Weight: 225 lb 4.8 oz (102.2 kg)    GENERAL:alert, no distress and comfortable SKIN: skin color, texture, turgor are normal, no rashes or significant  lesions EYES: normal, Conjunctiva are pink and non-injected, sclera clear Musculoskeletal:no cyanosis of digits and no clubbing  NEURO: alert & oriented x 3 with fluent speech, no focal motor/sensory deficits  LABORATORY DATA:  I have reviewed the data as listed    Component Value Date/Time   NA 139 01/24/2016 0931   K 3.6 01/24/2016 0931   CL 104 01/13/2016  0456   CL 105 07/14/2012 0939   CO2 23 01/24/2016 0931   GLUCOSE 108 01/24/2016 0931   GLUCOSE 103 (H) 07/14/2012 0939   BUN 10.4 01/24/2016 0931   CREATININE 0.8 01/24/2016 0931   CALCIUM 9.5 01/24/2016 0931   PROT 7.4 01/24/2016 0931   ALBUMIN 2.9 (L) 01/24/2016 0931   AST 28 01/24/2016 0931   ALT 19 01/24/2016 0931   ALKPHOS 394 (H) 01/24/2016 0931   BILITOT 0.89 01/24/2016 0931   GFRNONAA >60 01/13/2016 0456   GFRAA >60 01/13/2016 0456    No results found for: SPEP, UPEP  Lab Results  Component Value Date   WBC 5.5 01/24/2016   NEUTROABS 1.2 (L) 01/24/2016   HGB 10.9 (L) 01/24/2016   HCT 33.4 (L) 01/24/2016   MCV 99.1 01/24/2016   PLT 42 (L) 01/24/2016      Chemistry      Component Value Date/Time   NA 139 01/24/2016 0931   K 3.6 01/24/2016 0931   CL 104 01/13/2016 0456   CL 105 07/14/2012 0939   CO2 23 01/24/2016 0931   BUN 10.4 01/24/2016 0931   CREATININE 0.8 01/24/2016 0931      Component Value Date/Time   CALCIUM 9.5 01/24/2016 0931   ALKPHOS 394 (H) 01/24/2016 0931   AST 28 01/24/2016 0931   ALT 19 01/24/2016 0931   BILITOT 0.89 01/24/2016 0931       RADIOGRAPHIC STUDIES: I have personally reviewed the radiological images as listed and agreed with the findings in the report. Dg Chest 1 View  Result Date: 01/08/2016 CLINICAL DATA:  Abdominal pain for several days, history of CHF and hypertension EXAM: CHEST 1 VIEW COMPARISON:  Chest x-ray of 01/01/2016 FINDINGS: No active infiltrate or effusion is seen. Mediastinal and hilar contours are unremarkable. A right-sided Port-A-Cath is present  with the tip overlying the mid SVC. Mediastinal and hilar contours are unremarkable. The heart is borderline enlarged. No bony abnormality is seen. IMPRESSION: No active lung disease. Right-sided Port-A-Cath tip overlies the mid SVC. Electronically Signed   By: Ivar Drape M.D.   On: 01/08/2016 08:57   Dg Chest 2 View  Result Date: 01/01/2016 CLINICAL DATA:  Intermittent fevers for 4 months.  Weakness today. EXAM: CHEST  2 VIEW COMPARISON:  PA and lateral chest 12/11/2015 and 07/17/2015. FINDINGS: Port-A-Cath is unchanged. Lungs are clear. Heart size is upper normal. No pneumothorax or pleural effusion. Aortic atherosclerosis noted. IMPRESSION: No acute disease. Atherosclerosis. Electronically Signed   By: Inge Rise M.D.   On: 01/01/2016 18:49   Nm Pet Image Restag (ps) Skull Base To Thigh  Result Date: 01/24/2016 CLINICAL DATA:  Subsequent treatment strategy for restaging of lymphoma. EXAM: NUCLEAR MEDICINE PET SKULL BASE TO THIGH TECHNIQUE: 11.1 mCi F-18 FDG was injected intravenously. Full-ring PET imaging was performed from the skull base to thigh after the radiotracer. CT data was obtained and used for attenuation correction and anatomic localization. FASTING BLOOD GLUCOSE:  Value: 106 mg/dl COMPARISON:  07/10/2015.  abdominopelvic CT of 12/11/2015. FINDINGS: NECK Re- demonstration of diffuse thyroid hypermetabolism. This measures a S.U.V. max of 27.1 today versus a S.U.V. 32.8 on the prior. Enlargement of the left lobe is similar. Note is made of new left maxillary sinus opacification with peripheral hypermetabolism. No cervical adenopathy. There is also new left ethmoid air cell and maxillary sinus opacification. CHEST No soft tissue hypermetabolism within the chest. No residual left hilar hypermetabolism identified. A right Port-A-Cath which terminates at the mid right  atrium. Mild cardiomegaly. Multivessel coronary artery atherosclerosis. 3 mm left upper lobe pulmonary nodule is unchanged  on image 16/series 8. No thoracic adenopathy. ABDOMEN/PELVIS No abdominopelvic nodal or parenchymal hypermetabolism identified. Anal hypermetabolism measures a S.U.V. max of 10.0 and is without CT correlate. This is similar. Cholecystectomy. Aortic and branch vessel atherosclerosis. Proximal gastric underdistention. Anterior abdominal wall laxity again identified. Large colonic stool burden. Partial right hemicolectomy. SKELETON Development of diffuse marrow hypermetabolism. This makes delineation and evaluation of focal lesion described previously difficult to impossible. There is a right-sided sternal focus of hypermetabolism which measures a S.U.V. max of 8.1 including on approximately image 53/series 4. This is unchanged. IMPRESSION: 1. No evidence of hypermetabolic soft tissue lymphoma. 2. Redevelopment of diffuse marrow hypermetabolism which could be due to marrow stimulation by chemotherapy. This makes evaluation and delineation of the previously described focal osseous lesions difficult. A right-sided sternal lesion is not significantly changed. 3. Diffuse thyroid hypermetabolism persists and may represent thyroiditis. 4. Cardiomegaly. Coronary artery atherosclerosis. Aortic atherosclerosis. 5.  Possible constipation. 6. New left-sided sinus disease. Electronically Signed   By: Abigail Miyamoto M.D.   On: 01/24/2016 10:09   US Venous Img Lower Bilateral  Result Date: 01/08/2016 CLINICAL DATA:  Leg pain and swelling. EXAM: BILATERAL LOWER EXTREMITY VENOUS DOPPLER ULTRASOUND TECHNIQUE: Gray-scale sonography with graded compression, as well as color Doppler and duplex ultrasound were performed to evaluate the lower extremity deep venous systems from the level of the common femoral vein and including the common femoral, femoral, profunda femoral, popliteal and calf veins including the posterior tibial, peroneal and gastrocnemius veins when visible. The superficial great saphenous vein was also interrogated.  Spectral Doppler was utilized to evaluate flow at rest and with distal augmentation maneuvers in the common femoral, femoral and popliteal veins. COMPARISON:  No recent prior. FINDINGS: RIGHT LOWER EXTREMITY Common Femoral Vein: No evidence of thrombus. Normal compressibility, respiratory phasicity and response to augmentation. Saphenofemoral Junction: No evidence of thrombus. Normal compressibility and flow on color Doppler imaging. Profunda Femoral Vein: No evidence of thrombus. Normal compressibility and flow on color Doppler imaging. Femoral Vein: No evidence of thrombus. Normal compressibility, respiratory phasicity and response to augmentation. Popliteal Vein: No evidence of thrombus. Normal compressibility, respiratory phasicity and response to augmentation. Calf Veins: No evidence of thrombus. Normal compressibility and flow on color Doppler imaging. Superficial Great Saphenous Vein: No evidence of thrombus. Normal compressibility and flow on color Doppler imaging. Other Findings:  None. LEFT LOWER EXTREMITY Common Femoral Vein: No evidence of thrombus. Normal compressibility, respiratory phasicity and response to augmentation. Saphenofemoral Junction: No evidence of thrombus. Normal compressibility and flow on color Doppler imaging. Profunda Femoral Vein: No evidence of thrombus. Normal compressibility and flow on color Doppler imaging. Femoral Vein: No evidence of thrombus. Normal compressibility, respiratory phasicity and response to augmentation. Popliteal Vein: No evidence of thrombus. Normal compressibility, respiratory phasicity and response to augmentation. Calf Veins: No evidence of thrombus. Normal compressibility and flow on color Doppler imaging. Superficial Great Saphenous Vein: No evidence of thrombus. Normal compressibility and flow on color Doppler imaging. Other Findings:  None. IMPRESSION: No evidence of deep venous thrombosis. 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 She has significant activity within the bone marrow at that could also mean recovery of bone marrow from recent pancytopenia. She  has significant elevated alkaline phosphatase which I think is due to bone activity. I plan to see her back again next week to recheck her blood work and to provide supportive care  Pancytopenia, acquired [facility]) This is related to recent infection and her oral antibiotic therapy Her CBC is improving. She is not symptomatic. I will continue to observe She does not need blood transfusion today She has no recent bleeding complications  Chronic maxillary sinusitis She has recurrent sinusitis again on PET CT scan. She is symptomatic. She is already on antibiotic treatment. I recommend low-dose prednisone therapy to reduce the inflammation and pain  C. difficile colitis She has recent C. difficile colitis secondary to recurrent antibiotic therapy for her chronic sinusitis I recommend she continue antibiotic treatment as directed   Hypoalbuminemia due to protein-calorie malnutrition ([place]) This is secondary to poor oral intake, recurrent infection and recent hospitalization. I encouraged the patient to increase oral intake as tolerated   Orders Placed This Encounter  Procedures  . CBC with Differential/Platelet    Standing Status:   Future    Standing Expiration Date:   02/27/2017   All questions were answered. The patient knows to call the clinic with any problems, questions or concerns. No barriers to learning was detected. I spent 25 minutes counseling the patient face to face. The total time spent in the appointment was 30 minutes and more than 50% was on counseling and review of test results     [name], MD 01/24/2016 2:17 PM

## 2016-01-24 NOTE — Assessment & Plan Note (Signed)
This is secondary to poor oral intake, recurrent infection and recent hospitalization. I encouraged the patient to increase oral intake as tolerated

## 2016-01-24 NOTE — Assessment & Plan Note (Signed)
Repeat PET CT scan did not show definitive recurrence of lymphoma. She has significant activity within the bone marrow at that could also mean recovery of bone marrow from recent pancytopenia. She has significant elevated alkaline phosphatase which I think is due to bone activity. I plan to see her back again next week to recheck her blood work and to provide supportive care

## 2016-01-24 NOTE — Assessment & Plan Note (Signed)
She has recent C. difficile colitis secondary to recurrent antibiotic therapy for her chronic sinusitis I recommend she continue antibiotic treatment as directed

## 2016-01-24 NOTE — Telephone Encounter (Signed)
Appointments scheduled per 01/24/16 los. Patient was given a copy of the AVS report and appointment schedule, per 01/24/16 los.

## 2016-01-25 LAB — IGG, IGA, IGM
IGM (IMMUNOGLOBIN M), SRM: 12 mg/dL — AB (ref 26–217)
IgA, Qn, Serum: 20 mg/dL — ABNORMAL LOW (ref 64–422)

## 2016-01-30 ENCOUNTER — Telehealth: Payer: Self-pay | Admitting: *Deleted

## 2016-01-30 ENCOUNTER — Telehealth: Payer: Self-pay | Admitting: Hematology and Oncology

## 2016-01-30 ENCOUNTER — Other Ambulatory Visit (HOSPITAL_BASED_OUTPATIENT_CLINIC_OR_DEPARTMENT_OTHER): Payer: Medicare Other

## 2016-01-30 ENCOUNTER — Ambulatory Visit (HOSPITAL_BASED_OUTPATIENT_CLINIC_OR_DEPARTMENT_OTHER): Payer: Medicare Other | Admitting: Hematology and Oncology

## 2016-01-30 DIAGNOSIS — D61818 Other pancytopenia: Secondary | ICD-10-CM | POA: Diagnosis not present

## 2016-01-30 DIAGNOSIS — D801 Nonfamilial hypogammaglobulinemia: Secondary | ICD-10-CM | POA: Diagnosis not present

## 2016-01-30 DIAGNOSIS — C8298 Follicular lymphoma, unspecified, lymph nodes of multiple sites: Secondary | ICD-10-CM | POA: Diagnosis not present

## 2016-01-30 LAB — CBC WITH DIFFERENTIAL/PLATELET
BASO%: 0.4 % (ref 0.0–2.0)
Basophils Absolute: 0 10*3/uL (ref 0.0–0.1)
EOS ABS: 0 10*3/uL (ref 0.0–0.5)
EOS%: 0.2 % (ref 0.0–7.0)
HCT: 33.9 % — ABNORMAL LOW (ref 34.8–46.6)
HGB: 11 g/dL — ABNORMAL LOW (ref 11.6–15.9)
LYMPH%: 32.8 % (ref 14.0–49.7)
MCH: 32.6 pg (ref 25.1–34.0)
MCHC: 32.4 g/dL (ref 31.5–36.0)
MCV: 100.6 fL (ref 79.5–101.0)
MONO#: 1 10*3/uL — ABNORMAL HIGH (ref 0.1–0.9)
MONO%: 20.8 % — AB (ref 0.0–14.0)
NEUT%: 45.8 % (ref 38.4–76.8)
NEUTROS ABS: 2.2 10*3/uL (ref 1.5–6.5)
PLATELETS: 56 10*3/uL — AB (ref 145–400)
RBC: 3.37 10*6/uL — AB (ref 3.70–5.45)
RDW: 22.3 % — ABNORMAL HIGH (ref 11.2–14.5)
WBC: 4.8 10*3/uL (ref 3.9–10.3)
lymph#: 1.6 10*3/uL (ref 0.9–3.3)
nRBC: 2 % — ABNORMAL HIGH (ref 0–0)

## 2016-01-30 NOTE — Telephone Encounter (Signed)
Per LOS I have scheduled appts and notified the scheduler 

## 2016-01-30 NOTE — Telephone Encounter (Signed)
Message sent to infusion scheduler to be added per 01/30/16 los. Appointments scheduled per 01/30/16 los.  A copy of the appointment schedule and AVS report was given to the patient, per 01/30/16 los.

## 2016-01-31 ENCOUNTER — Encounter: Payer: Self-pay | Admitting: Hematology and Oncology

## 2016-01-31 NOTE — Assessment & Plan Note (Signed)
Repeat PET CT scan did not show definitive recurrence of lymphoma. She has significant activity within the bone marrow at that could also mean recovery of bone marrow from recent pancytopenia. She has significant elevated alkaline phosphatase which I think is due to bone activity. She will continue close surveillance imaging study

## 2016-01-31 NOTE — Progress Notes (Signed)
[facility] OFFICE PROGRESS NOTE  Patient Care Team: Abner Greenspan, MD as PCP - General Mosetta Anis, MD (Allergy) Fanny Skates, MD as Consulting Physician (General Surgery) Grace Isaac, MD as Consulting Physician (Cardiothoracic Surgery) Troy Sine, MD as Consulting Physician (Cardiology) Lonn Georgia, PA-C as Physician Assistant (Cardiology) [name], MD as Consulting Physician (Hematology and Oncology) Estill Cotta, MD as Consulting Physician (Ophthalmology)  SUMMARY OF ONCOLOGIC HISTORY: Oncology History   History of colon cancer   Staging form: Colon and Rectum, AJCC 7th Edition     Clinical: Stage IIA (T3, N0, M0) - Signed by [name], MD on 02/21/2014 History of hodgkin's lymphoma   Staging form: Lymphoid Neoplasms, AJCC 6th Edition     Clinical: Stage IV - Signed by [name], MD on 02/21/2014       History of Hodgkin's lymphoma   05/20/2013 Initial Diagnosis    Hodgkin lymphoma      06/27/2014 Imaging    Interval increase in metabolic activity of several small lymph nodes is concerning for lymphoma recurrence. Lymph nodes include a small right level 2 lymph node, right hilar lymph node, and right paratracheal lymph node      07/07/2014 Pathology Results     limited tissue from ultrasound-guided biopsy was nondiagnostic      07/07/2014 Procedure     ultrasound-guided biopsy was nondiagnostic      09/27/2014 Imaging    Repeat PET CT scan show diffuse disease concern for relapse.      10/17/2014 Pathology Results    Accession: [id number] Biopsy was nondiagnostic      10/17/2014 Procedure    She underwent bronchoscopy & EBUS & biopsy of  LN at Level 10L      11/01/2014 Surgery    She underwent cronchoscopy with endobronchial ultrasound, mediastinoscopy and biopsy       11/01/2014 Pathology Results    Accession: [id number] biopsy showed no evidence of lymphoma, only granulomas.      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 ([place])      12/13/2014 Imaging    PET scan showed diffuse disease      12/28/2014 - 12/30/2014 Hospital Admission    She was admitted to the hospital for cycle one of R-ICE      01/18/2015 - 01/20/2015 Chemotherapy    She was admitted to the hospital for cycle 2 of R-ICE      01/20/2015 - 01/22/2015 Hospital Admission    She was then admitted to [facility] with respiratory failure/fluid overload and was noted to have mild cardiomyopathy      01/23/2015 - 01/27/2015 Hospital Admission    she was admitted to the hospital with new onset atrial fibrillation with rapid ventricular response and was found to have evidence of myocardial ischemia. She was discharged on  medical management and oral anticoagulation therapy      01/25/2015 Procedure    Left heart cath showed non-obstructive lesions: Prox RCA lesion, 40% stenosed.   Prox LAD to Mid LAD lesion, 30% stenosed. Ost LM lesion, 25% stenosed      01/30/2015 Imaging    PET Ct scan showed near complete resolution of all disease      03/12/2015 - 09/10/2015 Chemotherapy    She received 3 doses of Rituximab every other month      07/10/2015 PET scan    PET showed Improved metabolic activity in the bony lesions with mild persistent hilar LN with increased SUV uptake      09/06/2015 Imaging    Ct scan showed interval development of mild mucosal thickening involving left  maxillary and bilateral ethmoid sinuses consistent with mild sinusitis.      12/10/2015 - 12/16/2015 Hospital Admission    She was admitted to the hospital due to diarrhea and colitis and was found to have Clostridium difficile      01/06/2016 - 01/13/2016 Hospital Admission    She had recurrent hospitalization for weakness, pancytopenia and persistent C Difficile infection      01/24/2016 PET scan    No evidence of hypermetabolic soft tissue lymphoma. 2. Redevelopment of diffuse marrow hypermetabolism which could be due to marrow stimulation by chemotherapy. This makes evaluation and delineation of the previously described focal osseous lesions difficult. A right-sided sternal lesion is not significantly changed. 3. Diffuse thyroid hypermetabolism persists and may represent thyroiditis. 4. Cardiomegaly. Coronary artery atherosclerosis. Aortic atherosclerosis. 5. Possible constipation. 6. New left-sided sinus disease.       INTERVAL HISTORY: Please see below for problem oriented charting. She returns today for supportive care visit. She continues to have sinus congestion. Denies recent diarrhea. She is slowly improving. She has chronic lower extremity edema but denies chest pain or shortness of breath. The patient denies any recent signs or symptoms of bleeding such as spontaneous epistaxis, hematuria or hematochezia.   REVIEW OF SYSTEMS:   Constitutional: Denies fevers, chills or abnormal weight loss Eyes: Denies blurriness of vision Ears, nose, mouth, throat, and face: Denies mucositis or sore throat Respiratory: Denies cough, dyspnea or wheezes Cardiovascular: Denies palpitation, chest discomfort  Gastrointestinal:  Denies nausea, heartburn or change in bowel habits Skin: Denies abnormal skin rashes Lymphatics: Denies new lymphadenopathy or easy bruising Neurological:Denies numbness, tingling or new weaknesses Behavioral/Psych: Mood is stable, no new changes  All other systems  were reviewed with the patient and are negative.  I have reviewed the past medical history, past surgical history, social history and family history with the patient and they are unchanged from previous note.  ALLERGIES:  is allergic to achromycin [tetracycline hcl]; allopurinol; astelin [azelastine hcl]; cephalexin; codeine; lisinopril; meloxicam; minocycline; nabumetone; nyquil [pseudoeph-doxylamine-dm-apap]; sulfa antibiotics; zolpidem tartrate; buspar [buspirone hcl]; and ciprofloxacin.  MEDICATIONS:  Current Outpatient Prescriptions  Medication Sig Dispense Refill  . acetaminophen (TYLENOL) 650 MG CR tablet Take 650 mg by mouth every 8 (eight) hours as needed for pain or fever.     Marland Kitchen acidophilus (RISAQUAD) CAPS capsule Take 1 capsule by mouth 3 (three) times daily. X 10 days 30 capsule 0  . acyclovir (ZOVIRAX) 400 MG tablet Take 1 tablet (400 mg total) by mouth daily. 90 tablet 3  . Albuterol Sulfate (PROAIR RESPICLICK) 951 (90 Base) MCG/ACT AEPB Inhale 1-2 puffs into the lungs every 6 (six) hours as needed (SOB, wheezing).     Marland Kitchen BREO ELLIPTA 200-25  MCG/INH AEPB Inhale 1 puff into the lungs every evening.     . Calcium Carbonate-Vitamin D (CALCIUM 600+D) 600-400 MG-UNIT per tablet Take 1 tablet by mouth every evening.     . Cholecalciferol (VITAMIN D-3) 1000 UNITS CAPS Take 1,000 Units by mouth daily.    Marland Kitchen estradiol (ESTRACE) 1 MG tablet Take 1 tablet (1 mg total) by mouth daily. 90 tablet 3  . fexofenadine (ALLEGRA) 180 MG tablet TAKE 1 TABLET (180 MG TOTAL) BY MOUTH DAILY. (*OTC)  3  . FLUoxetine (PROZAC) 20 MG capsule Take 1 capsule (20 mg total) by mouth daily. 90 capsule 3  . furosemide (LASIX) 20 MG tablet Take 1 tablet (20 mg total) by mouth daily. Take 1 tab by mouth daily for weight gain. 90 tablet 3  . gabapentin (NEURONTIN) 300 MG capsule Take 1 capsule (300 mg total) by mouth 2 (two) times daily. 180 capsule 3  . levothyroxine (SYNTHROID, LEVOTHROID) 112 MCG tablet Take 1 tablet  (112 mcg total) by mouth daily before breakfast. 90 tablet 3  . lidocaine-prilocaine (EMLA) cream Apply 1 application topically as needed (For port-a-cath.). Reported on 02/22/2015    . LORazepam (ATIVAN) 0.5 MG tablet Take 1 tablet (0.5 mg total) by mouth every 12 (twelve) hours as needed for anxiety. 15 tablet 0  . losartan (COZAAR) 25 MG tablet Take 1 tablet (25 mg total) by mouth daily. 90 tablet 3  . metoprolol (LOPRESSOR) 50 MG tablet TAKE 1 TABLET (50 MG TOTAL) BY MOUTH 2 (TWO) TIMES DAILY. 180 tablet 3  . Omega-3 350 MG CAPS Take 1 capsule by mouth daily. Reported on 02/22/2015    . omeprazole (PRILOSEC) 40 MG capsule     . ondansetron (ZOFRAN) 8 MG tablet Take 8 mg by mouth every 6 (six) hours as needed for nausea or vomiting. Reported on 03/05/2015    . predniSONE (DELTASONE) 20 MG tablet Take 1 tablet (20 mg total) by mouth daily with breakfast. 10 tablet 0  . prochlorperazine (COMPAZINE) 10 MG tablet Take 10 mg by mouth every 6 (six) hours as needed for nausea or vomiting. Reported on 03/05/2015    . simvastatin (ZOCOR) 40 MG tablet Take 1 tablet (40 mg total) by mouth at bedtime. 90 tablet 3  . vancomycin (VANCOCIN) 50 mg/mL oral solution Take 10 mLs (500 mg total) by mouth every 6 (six) hours. X 12 more days 500 mL 0   No current facility-administered medications for this visit.    Facility-Administered Medications Ordered in Other Visits  Medication Dose Route Frequency Provider Last Rate Last Dose  . sodium chloride 0.9 % injection 10 mL  10 mL Intravenous PRN [name], MD   10 mL at 12/20/15 1436    PHYSICAL EXAMINATION: ECOG PERFORMANCE STATUS: 1 - Symptomatic but completely ambulatory  Vitals:   01/30/16 1035  BP: (!) 140/51  Pulse: 72  Resp: 18  Temp: 99.5 F (37.5 C)   Filed Weights   01/30/16 1035  Weight: 224 lb 4.8 oz (101.7 kg)    GENERAL:alert, no distress and comfortable SKIN: skin color, texture, turgor are normal, no rashes or significant lesions EYES:  normal, Conjunctiva are pink and non-injected, sclera clear Musculoskeletal:no cyanosis of digits and no clubbing  NEURO: alert & oriented x 3 with fluent speech, no focal motor/sensory deficits  LABORATORY DATA:  I have reviewed the data as listed    Component Value Date/Time   NA 139 01/24/2016 0931   K 3.6 01/24/2016 0931  CL 104 01/13/2016 0456   CL 105 07/14/2012 0939   CO2 23 01/24/2016 0931   GLUCOSE 108 01/24/2016 0931   GLUCOSE 103 (H) 07/14/2012 0939   BUN 10.4 01/24/2016 0931   CREATININE 0.8 01/24/2016 0931   CALCIUM 9.5 01/24/2016 0931   PROT 7.4 01/24/2016 0931   ALBUMIN 2.9 (L) 01/24/2016 0931   AST 28 01/24/2016 0931   ALT 19 01/24/2016 0931   ALKPHOS 394 (H) 01/24/2016 0931   BILITOT 0.89 01/24/2016 0931   GFRNONAA >60 01/13/2016 0456   GFRAA >60 01/13/2016 0456    No results found for: SPEP, UPEP  Lab Results  Component Value Date   WBC 4.8 01/30/2016   NEUTROABS 2.2 01/30/2016   HGB 11.0 (L) 01/30/2016   HCT 33.9 (L) 01/30/2016   MCV 100.6 01/30/2016   PLT 56 (L) 01/30/2016      Chemistry      Component Value Date/Time   NA 139 01/24/2016 0931   K 3.6 01/24/2016 0931   CL 104 01/13/2016 0456   CL 105 07/14/2012 0939   CO2 23 01/24/2016 0931   BUN 10.4 01/24/2016 0931   CREATININE 0.8 01/24/2016 0931      Component Value Date/Time   CALCIUM 9.5 01/24/2016 0931   ALKPHOS 394 (H) 01/24/2016 0931   AST 28 01/24/2016 0931   ALT 19 01/24/2016 0931   BILITOT 0.89 01/24/2016 0931       RADIOGRAPHIC STUDIES: I have personally reviewed the radiological images as listed and agreed with the findings in the report. Dg Chest 1 View  Result Date: 01/08/2016 CLINICAL DATA:  Abdominal pain for several days, history of CHF and hypertension EXAM: CHEST 1 VIEW COMPARISON:  Chest x-ray of 01/01/2016 FINDINGS: No active infiltrate or effusion is seen. Mediastinal and hilar contours are unremarkable. A right-sided Port-A-Cath is present with the tip  overlying the mid SVC. Mediastinal and hilar contours are unremarkable. The heart is borderline enlarged. No bony abnormality is seen. IMPRESSION: No active lung disease. Right-sided Port-A-Cath tip overlies the mid SVC. Electronically Signed   By: Ivar Drape M.D.   On: 01/08/2016 08:57   Dg Chest 2 View  Result Date: 01/01/2016 CLINICAL DATA:  Intermittent fevers for 4 months.  Weakness today. EXAM: CHEST  2 VIEW COMPARISON:  PA and lateral chest 12/11/2015 and 07/17/2015. FINDINGS: Port-A-Cath is unchanged. Lungs are clear. Heart size is upper normal. No pneumothorax or pleural effusion. Aortic atherosclerosis noted. IMPRESSION: No acute disease. Atherosclerosis. Electronically Signed   By: Inge Rise M.D.   On: 01/01/2016 18:49   Nm Pet Image Restag (ps) Skull Base To Thigh  Result Date: 01/24/2016 CLINICAL DATA:  Subsequent treatment strategy for restaging of lymphoma. EXAM: NUCLEAR MEDICINE PET SKULL BASE TO THIGH TECHNIQUE: 11.1 mCi F-18 FDG was injected intravenously. Full-ring PET imaging was performed from the skull base to thigh after the radiotracer. CT data was obtained and used for attenuation correction and anatomic localization. FASTING BLOOD GLUCOSE:  Value: 106 mg/dl COMPARISON:  07/10/2015.  abdominopelvic CT of 12/11/2015. FINDINGS: NECK Re- demonstration of diffuse thyroid hypermetabolism. This measures a S.U.V. max of 27.1 today versus a S.U.V. 32.8 on the prior. Enlargement of the left lobe is similar. Note is made of new left maxillary sinus opacification with peripheral hypermetabolism. No cervical adenopathy. There is also new left ethmoid air cell and maxillary sinus opacification. CHEST No soft tissue hypermetabolism within the chest. No residual left hilar hypermetabolism identified. A right Port-A-Cath which terminates at the  mid right atrium. Mild cardiomegaly. Multivessel coronary artery atherosclerosis. 3 mm left upper lobe pulmonary nodule is unchanged on image  16/series 8. No thoracic adenopathy. ABDOMEN/PELVIS No abdominopelvic nodal or parenchymal hypermetabolism identified. Anal hypermetabolism measures a S.U.V. max of 10.0 and is without CT correlate. This is similar. Cholecystectomy. Aortic and branch vessel atherosclerosis. Proximal gastric underdistention. Anterior abdominal wall laxity again identified. Large colonic stool burden. Partial right hemicolectomy. SKELETON Development of diffuse marrow hypermetabolism. This makes delineation and evaluation of focal lesion described previously difficult to impossible. There is a right-sided sternal focus of hypermetabolism which measures a S.U.V. max of 8.1 including on approximately image 53/series 4. This is unchanged. IMPRESSION: 1. No evidence of hypermetabolic soft tissue lymphoma. 2. Redevelopment of diffuse marrow hypermetabolism which could be due to marrow stimulation by chemotherapy. This makes evaluation and delineation of the previously described focal osseous lesions difficult. A right-sided sternal lesion is not significantly changed. 3. Diffuse thyroid hypermetabolism persists and may represent thyroiditis. 4. Cardiomegaly. Coronary artery atherosclerosis. Aortic atherosclerosis. 5.  Possible constipation. 6. New left-sided sinus disease. Electronically Signed   By: Abigail Miyamoto M.D.   On: 01/24/2016 10:09   US Venous Img Lower Bilateral  Result Date: 01/08/2016 CLINICAL DATA:  Leg pain and swelling. EXAM: BILATERAL LOWER EXTREMITY VENOUS DOPPLER ULTRASOUND TECHNIQUE: Gray-scale sonography with graded compression, as well as color Doppler and duplex ultrasound were performed to evaluate the lower extremity deep venous systems from the level of the common femoral vein and including the common femoral, femoral, profunda femoral, popliteal and calf veins including the posterior tibial, peroneal and gastrocnemius veins when visible. The superficial great saphenous vein was also interrogated. Spectral  Doppler was utilized to evaluate flow at rest and with distal augmentation maneuvers in the common femoral, femoral and popliteal veins. COMPARISON:  No recent prior. FINDINGS: RIGHT LOWER EXTREMITY Common Femoral Vein: No evidence of thrombus. Normal compressibility, respiratory phasicity and response to augmentation. Saphenofemoral Junction: No evidence of thrombus. Normal compressibility and flow on color Doppler imaging. Profunda Femoral Vein: No evidence of thrombus. Normal compressibility and flow on color Doppler imaging. Femoral Vein: No evidence of thrombus. Normal compressibility, respiratory phasicity and response to augmentation. Popliteal Vein: No evidence of thrombus. Normal compressibility, respiratory phasicity and response to augmentation. Calf Veins: No evidence of thrombus. Normal compressibility and flow on color Doppler imaging. Superficial Great Saphenous Vein: No evidence of thrombus. Normal compressibility and flow on color Doppler imaging. Other Findings:  None. LEFT LOWER EXTREMITY Common Femoral Vein: No evidence of thrombus. Normal compressibility, respiratory phasicity and response to augmentation. Saphenofemoral Junction: No evidence of thrombus. Normal compressibility and flow on color Doppler imaging. Profunda Femoral Vein: No evidence of thrombus. Normal compressibility and flow on color Doppler imaging. Femoral Vein: No evidence of thrombus. Normal compressibility, respiratory phasicity and response to augmentation. Popliteal Vein: No evidence of thrombus. Normal compressibility, respiratory phasicity and response to augmentation. Calf Veins: No evidence of thrombus. Normal compressibility and flow on color Doppler imaging. Superficial Great Saphenous Vein: No evidence of thrombus. Normal compressibility and flow on color Doppler imaging. Other Findings:  None. IMPRESSION: No evidence of deep venous thrombosis. 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 She has significant activity within the bone marrow at that could also mean recovery of bone marrow from recent  pancytopenia. She has significant elevated alkaline phosphatase which I think is due to bone activity. She will continue close surveillance imaging study  Pancytopenia, acquired [facility]) This is related to recent infection and her oral antibiotic therapy Her CBC is improving. She is not symptomatic. 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 I discussed the importance of discontinuation of rituximab and to consider monthly IVIG treatment. The risks, benefit, side effects of IVIG treatment including risk of allergic reaction, hives, itching, serum sickness were discussed and she agreed to proceed. I would dose her at 1 g/kg once every 4 weeks and consider more frequent treatment every 3 weeks I recommend we continue treatment as scheduled   No orders of the defined types were placed in this encounter.  All questions were answered. The patient knows to call the clinic with any problems, questions or concerns. No barriers to learning was detected. I spent 15 minutes counseling the patient face to face. The total time spent in the appointment was 20 minutes and more than 50% was on counseling and review of test results     [name], MD 01/31/2016 2:29 PM

## 2016-01-31 NOTE — Assessment & Plan Note (Signed)
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 I discussed the importance of discontinuation of rituximab and to consider monthly IVIG treatment. The risks, benefit, side effects of IVIG treatment including risk of allergic reaction, hives, itching, serum sickness were discussed and she agreed to proceed. I would dose her at 1 g/kg once every 4 weeks and consider more frequent treatment every 3 weeks I recommend we continue treatment as scheduled

## 2016-01-31 NOTE — Assessment & Plan Note (Signed)
This is related to recent infection and her oral antibiotic therapy Her CBC is improving. She is not symptomatic. I will continue to observe She does not need blood transfusion today She has no recent bleeding complications

## 2016-02-01 ENCOUNTER — Encounter: Payer: Self-pay | Admitting: Allergy & Immunology

## 2016-02-13 ENCOUNTER — Encounter (HOSPITAL_BASED_OUTPATIENT_CLINIC_OR_DEPARTMENT_OTHER): Payer: Medicare Other

## 2016-02-14 ENCOUNTER — Encounter (HOSPITAL_COMMUNITY): Payer: Self-pay

## 2016-02-14 ENCOUNTER — Inpatient Hospital Stay (HOSPITAL_COMMUNITY)
Admission: EM | Admit: 2016-02-14 | Discharge: 2016-02-22 | DRG: 135 | Disposition: A | Discharge status: Home Health | Payer: Medicare Other | Attending: Internal Medicine | Admitting: Internal Medicine

## 2016-02-14 ENCOUNTER — Observation Stay (HOSPITAL_COMMUNITY): Payer: Medicare Other

## 2016-02-14 DIAGNOSIS — R7302 Impaired glucose tolerance (oral): Secondary | ICD-10-CM | POA: Diagnosis present

## 2016-02-14 DIAGNOSIS — C8228 Follicular lymphoma grade III, unspecified, lymph nodes of multiple sites: Secondary | ICD-10-CM | POA: Diagnosis not present

## 2016-02-14 DIAGNOSIS — J45909 Unspecified asthma, uncomplicated: Secondary | ICD-10-CM | POA: Diagnosis present

## 2016-02-14 DIAGNOSIS — J320 Chronic maxillary sinusitis: Secondary | ICD-10-CM | POA: Diagnosis not present

## 2016-02-14 DIAGNOSIS — I480 Paroxysmal atrial fibrillation: Secondary | ICD-10-CM | POA: Diagnosis present

## 2016-02-14 DIAGNOSIS — Z79899 Other long term (current) drug therapy: Secondary | ICD-10-CM

## 2016-02-14 DIAGNOSIS — K649 Unspecified hemorrhoids: Secondary | ICD-10-CM | POA: Diagnosis present

## 2016-02-14 DIAGNOSIS — C8510 Unspecified B-cell lymphoma, unspecified site: Secondary | ICD-10-CM | POA: Diagnosis not present

## 2016-02-14 DIAGNOSIS — D696 Thrombocytopenia, unspecified: Secondary | ICD-10-CM | POA: Diagnosis present

## 2016-02-14 DIAGNOSIS — E039 Hypothyroidism, unspecified: Secondary | ICD-10-CM | POA: Diagnosis present

## 2016-02-14 DIAGNOSIS — I110 Hypertensive heart disease with heart failure: Secondary | ICD-10-CM | POA: Diagnosis not present

## 2016-02-14 DIAGNOSIS — Z85038 Personal history of other malignant neoplasm of large intestine: Secondary | ICD-10-CM

## 2016-02-14 DIAGNOSIS — C8190 Hodgkin lymphoma, unspecified, unspecified site: Secondary | ICD-10-CM | POA: Diagnosis not present

## 2016-02-14 DIAGNOSIS — D801 Nonfamilial hypogammaglobulinemia: Secondary | ICD-10-CM

## 2016-02-14 DIAGNOSIS — M1990 Unspecified osteoarthritis, unspecified site: Secondary | ICD-10-CM | POA: Diagnosis present

## 2016-02-14 DIAGNOSIS — I428 Other cardiomyopathies: Secondary | ICD-10-CM | POA: Diagnosis present

## 2016-02-14 DIAGNOSIS — B9710 Unspecified enterovirus as the cause of diseases classified elsewhere: Secondary | ICD-10-CM | POA: Diagnosis present

## 2016-02-14 DIAGNOSIS — D638 Anemia in other chronic diseases classified elsewhere: Secondary | ICD-10-CM | POA: Diagnosis present

## 2016-02-14 DIAGNOSIS — R0600 Dyspnea, unspecified: Secondary | ICD-10-CM

## 2016-02-14 DIAGNOSIS — D684 Acquired coagulation factor deficiency: Secondary | ICD-10-CM | POA: Diagnosis present

## 2016-02-14 DIAGNOSIS — R079 Chest pain, unspecified: Secondary | ICD-10-CM | POA: Diagnosis not present

## 2016-02-14 DIAGNOSIS — R0602 Shortness of breath: Secondary | ICD-10-CM | POA: Diagnosis not present

## 2016-02-14 DIAGNOSIS — D6959 Other secondary thrombocytopenia: Secondary | ICD-10-CM | POA: Diagnosis present

## 2016-02-14 DIAGNOSIS — E785 Hyperlipidemia, unspecified: Secondary | ICD-10-CM | POA: Diagnosis present

## 2016-02-14 DIAGNOSIS — J321 Chronic frontal sinusitis: Secondary | ICD-10-CM | POA: Diagnosis present

## 2016-02-14 DIAGNOSIS — I5042 Chronic combined systolic (congestive) and diastolic (congestive) heart failure: Secondary | ICD-10-CM | POA: Diagnosis not present

## 2016-02-14 DIAGNOSIS — T451X5A Adverse effect of antineoplastic and immunosuppressive drugs, initial encounter: Secondary | ICD-10-CM | POA: Diagnosis present

## 2016-02-14 DIAGNOSIS — R042 Hemoptysis: Secondary | ICD-10-CM | POA: Diagnosis present

## 2016-02-14 DIAGNOSIS — B9789 Other viral agents as the cause of diseases classified elsewhere: Secondary | ICD-10-CM | POA: Diagnosis present

## 2016-02-14 DIAGNOSIS — Z6836 Body mass index (BMI) 36.0-36.9, adult: Secondary | ICD-10-CM

## 2016-02-14 DIAGNOSIS — G620 Drug-induced polyneuropathy: Secondary | ICD-10-CM | POA: Diagnosis present

## 2016-02-14 DIAGNOSIS — D62 Acute posthemorrhagic anemia: Secondary | ICD-10-CM | POA: Diagnosis not present

## 2016-02-14 DIAGNOSIS — D61818 Other pancytopenia: Secondary | ICD-10-CM | POA: Diagnosis present

## 2016-02-14 DIAGNOSIS — K921 Melena: Secondary | ICD-10-CM

## 2016-02-14 DIAGNOSIS — R040 Epistaxis: Secondary | ICD-10-CM | POA: Diagnosis not present

## 2016-02-14 DIAGNOSIS — J322 Chronic ethmoidal sinusitis: Secondary | ICD-10-CM | POA: Diagnosis present

## 2016-02-14 DIAGNOSIS — E43 Unspecified severe protein-calorie malnutrition: Secondary | ICD-10-CM | POA: Diagnosis present

## 2016-02-14 DIAGNOSIS — Z9842 Cataract extraction status, left eye: Secondary | ICD-10-CM

## 2016-02-14 DIAGNOSIS — K219 Gastro-esophageal reflux disease without esophagitis: Secondary | ICD-10-CM | POA: Diagnosis present

## 2016-02-14 DIAGNOSIS — J069 Acute upper respiratory infection, unspecified: Secondary | ICD-10-CM | POA: Diagnosis present

## 2016-02-14 DIAGNOSIS — I739 Peripheral vascular disease, unspecified: Secondary | ICD-10-CM | POA: Diagnosis present

## 2016-02-14 DIAGNOSIS — Z9841 Cataract extraction status, right eye: Secondary | ICD-10-CM

## 2016-02-14 DIAGNOSIS — I700 Atherosclerosis of aorta: Secondary | ICD-10-CM | POA: Diagnosis present

## 2016-02-14 DIAGNOSIS — I5033 Acute on chronic diastolic (congestive) heart failure: Secondary | ICD-10-CM

## 2016-02-14 DIAGNOSIS — I10 Essential (primary) hypertension: Secondary | ICD-10-CM | POA: Diagnosis not present

## 2016-02-14 DIAGNOSIS — R262 Difficulty in walking, not elsewhere classified: Secondary | ICD-10-CM

## 2016-02-14 LAB — CBC WITH DIFFERENTIAL/PLATELET
Basophils Absolute: 0 10*3/uL (ref 0.0–0.1)
Basophils Relative: 0 %
EOS PCT: 0 %
Eosinophils Absolute: 0 10*3/uL (ref 0.0–0.7)
HEMATOCRIT: 27.5 % — AB (ref 36.0–46.0)
HEMOGLOBIN: 9.1 g/dL — AB (ref 12.0–15.0)
LYMPHS PCT: 71 %
Lymphs Abs: 5.6 10*3/uL — ABNORMAL HIGH (ref 0.7–4.0)
MCH: 31.8 pg (ref 26.0–34.0)
MCHC: 33.1 g/dL (ref 30.0–36.0)
MCV: 96.2 fL (ref 78.0–100.0)
MONOS PCT: 16 %
Monocytes Absolute: 1.2 10*3/uL — ABNORMAL HIGH (ref 0.1–1.0)
NEUTROS PCT: 13 %
Neutro Abs: 1 10*3/uL — ABNORMAL LOW (ref 1.7–7.7)
Platelets: 16 10*3/uL — CL (ref 150–400)
RBC: 2.86 MIL/uL — AB (ref 3.87–5.11)
RDW: 20.2 % — ABNORMAL HIGH (ref 11.5–15.5)
WBC: 7.8 10*3/uL (ref 4.0–10.5)

## 2016-02-14 LAB — URINALYSIS, ROUTINE W REFLEX MICROSCOPIC
BILIRUBIN URINE: NEGATIVE
Glucose, UA: 50 mg/dL — AB
Hgb urine dipstick: NEGATIVE
KETONES UR: 5 mg/dL — AB
LEUKOCYTES UA: NEGATIVE
NITRITE: NEGATIVE
Protein, ur: NEGATIVE mg/dL
SPECIFIC GRAVITY, URINE: 1.019 (ref 1.005–1.030)
pH: 5 (ref 5.0–8.0)

## 2016-02-14 LAB — INFLUENZA PANEL BY PCR (TYPE A & B)
Influenza A By PCR: NEGATIVE
Influenza B By PCR: NEGATIVE

## 2016-02-14 LAB — PROTIME-INR
INR: 1.13
PROTHROMBIN TIME: 14.6 s (ref 11.4–15.2)

## 2016-02-14 LAB — BASIC METABOLIC PANEL
Anion gap: 6 (ref 5–15)
BUN: 19 mg/dL (ref 6–20)
CHLORIDE: 100 mmol/L — AB (ref 101–111)
CO2: 27 mmol/L (ref 22–32)
Calcium: 8.9 mg/dL (ref 8.9–10.3)
Creatinine, Ser: 0.67 mg/dL (ref 0.44–1.00)
GFR calc Af Amer: >60 mL/min (ref >60)
GFR calc non Af Amer: >60 mL/min (ref >60)
Glucose, Bld: 122 mg/dL — ABNORMAL HIGH (ref 65–99)
POTASSIUM: 4.4 mmol/L (ref 3.5–5.1)
SODIUM: 133 mmol/L — AB (ref 135–145)

## 2016-02-14 LAB — APTT: aPTT: 38 seconds — ABNORMAL HIGH (ref 24–36)

## 2016-02-14 MED ORDER — PANTOPRAZOLE SODIUM 40 MG PO TBEC
80.0000 mg | DELAYED_RELEASE_TABLET | Freq: Every day | ORAL | Status: DC
Start: 2016-02-14 — End: 2016-02-16
  Administered 2016-02-15 – 2016-02-16 (×2): 80 mg via ORAL
  Filled 2016-02-14 (×2): qty 2

## 2016-02-14 MED ORDER — FUROSEMIDE 20 MG PO TABS
20.0000 mg | ORAL_TABLET | Freq: Every day | ORAL | Status: DC
  Administered 2016-02-15 – 2016-02-22 (×8): 20 mg via ORAL
  Filled 2016-02-14 (×8): qty 1

## 2016-02-14 MED ORDER — ALBUTEROL SULFATE (2.5 MG/3ML) 0.083% IN NEBU: 2.5000 mg | INHALATION_SOLUTION | Freq: Four times a day (QID) | RESPIRATORY_TRACT | Status: DC | PRN

## 2016-02-14 MED ORDER — SIMVASTATIN 40 MG PO TABS
40.0000 mg | ORAL_TABLET | Freq: Every day | ORAL | Status: DC
  Administered 2016-02-14 – 2016-02-21 (×8): 40 mg via ORAL
  Filled 2016-02-14 (×8): qty 1

## 2016-02-14 MED ORDER — GABAPENTIN 300 MG PO CAPS
300.0000 mg | ORAL_CAPSULE | Freq: Two times a day (BID) | ORAL | Status: DC
  Administered 2016-02-14 – 2016-02-22 (×16): 300 mg via ORAL
  Filled 2016-02-14 (×16): qty 1

## 2016-02-14 MED ORDER — FLUOXETINE HCL 20 MG PO CAPS
20.0000 mg | ORAL_CAPSULE | Freq: Every day | ORAL | Status: DC
  Administered 2016-02-15 – 2016-02-22 (×8): 20 mg via ORAL
  Filled 2016-02-14 (×8): qty 1

## 2016-02-14 MED ORDER — ALBUTEROL SULFATE 108 (90 BASE) MCG/ACT IN AEPB
1.0000 | INHALATION_SPRAY | Freq: Four times a day (QID) | RESPIRATORY_TRACT | Status: DC | PRN
Start: 2016-02-14 — End: 2016-02-14

## 2016-02-14 MED ORDER — RISAQUAD PO CAPS
1.0000 | ORAL_CAPSULE | Freq: Every day | ORAL | Status: DC
  Administered 2016-02-15 – 2016-02-22 (×8): 1 via ORAL
  Filled 2016-02-14 (×8): qty 1

## 2016-02-14 MED ORDER — LEVOTHYROXINE SODIUM 112 MCG PO TABS
112.0000 ug | ORAL_TABLET | Freq: Every day | ORAL | Status: DC
  Administered 2016-02-15 – 2016-02-22 (×8): 112 ug via ORAL
  Filled 2016-02-14 (×8): qty 1

## 2016-02-14 MED ORDER — ONDANSETRON HCL 4 MG PO TABS: 4.0000 mg | ORAL_TABLET | Freq: Four times a day (QID) | ORAL | Status: DC | PRN

## 2016-02-14 MED ORDER — ADULT MULTIVITAMIN W/MINERALS CH
1.0000 | ORAL_TABLET | Freq: Every evening | ORAL | Status: DC
  Administered 2016-02-15 – 2016-02-21 (×6): 1 via ORAL
  Filled 2016-02-14 (×7): qty 1

## 2016-02-14 MED ORDER — ACYCLOVIR 400 MG PO TABS
400.0000 mg | ORAL_TABLET | Freq: Every day | ORAL | Status: DC
  Administered 2016-02-15 – 2016-02-22 (×8): 400 mg via ORAL
  Filled 2016-02-14 (×8): qty 1

## 2016-02-14 MED ORDER — OXYMETAZOLINE HCL 0.05 % NA SOLN
1.0000 | Freq: Once | NASAL | Status: AC
  Administered 2016-02-14: 1 via NASAL
  Filled 2016-02-14: qty 15

## 2016-02-14 MED ORDER — ONDANSETRON HCL 4 MG/2ML IJ SOLN
4.0000 mg | Freq: Four times a day (QID) | INTRAMUSCULAR | Status: DC | PRN
  Administered 2016-02-20: 4 mg via INTRAVENOUS

## 2016-02-14 MED ORDER — METOPROLOL TARTRATE 12.5 MG HALF TABLET
12.5000 mg | ORAL_TABLET | Freq: Two times a day (BID) | ORAL | Status: DC
  Administered 2016-02-14: 12.5 mg via ORAL
  Administered 2016-02-15: 25 mg via ORAL
  Administered 2016-02-16 – 2016-02-18 (×4): 12.5 mg via ORAL
  Administered 2016-02-19: 25 mg via ORAL
  Administered 2016-02-19 – 2016-02-22 (×6): 12.5 mg via ORAL
  Filled 2016-02-14 (×13): qty 1

## 2016-02-14 MED ORDER — FLUTICASONE FUROATE-VILANTEROL 200-25 MCG/INH IN AEPB
1.0000 | INHALATION_SPRAY | Freq: Every evening | RESPIRATORY_TRACT | Status: DC
  Administered 2016-02-14 – 2016-02-18 (×5): 1 via RESPIRATORY_TRACT
  Filled 2016-02-14 (×3): qty 28

## 2016-02-14 MED ORDER — PROBIOTIC ACIDOPHILUS PO CAPS: 1.0000 | ORAL_CAPSULE | Freq: Every evening | ORAL | Status: DC

## 2016-02-14 MED ORDER — CALCIUM CARBONATE-VITAMIN D 500-200 MG-UNIT PO TABS
1.0000 | ORAL_TABLET | Freq: Every evening | ORAL | Status: DC
  Administered 2016-02-15 – 2016-02-21 (×6): 1 via ORAL
  Filled 2016-02-14 (×5): qty 1
  Filled 2016-02-14: qty 2
  Filled 2016-02-14 (×2): qty 1

## 2016-02-14 MED ORDER — ACETAMINOPHEN 650 MG RE SUPP: 650.0000 mg | Freq: Four times a day (QID) | RECTAL | Status: DC | PRN

## 2016-02-14 MED ORDER — MUPIROCIN 2 % EX OINT
TOPICAL_OINTMENT | CUTANEOUS | Status: AC
  Filled 2016-02-14: qty 22

## 2016-02-14 MED ORDER — LORATADINE 10 MG PO TABS
10.0000 mg | ORAL_TABLET | Freq: Every day | ORAL | Status: DC
  Administered 2016-02-15 – 2016-02-22 (×8): 10 mg via ORAL
  Filled 2016-02-14 (×9): qty 1

## 2016-02-14 MED ORDER — ESTRADIOL 1 MG PO TABS
1.0000 mg | ORAL_TABLET | Freq: Every day | ORAL | Status: DC
  Administered 2016-02-15 – 2016-02-22 (×8): 1 mg via ORAL
  Filled 2016-02-14 (×8): qty 1

## 2016-02-14 MED ORDER — SODIUM CHLORIDE 0.9 % IV SOLN
Freq: Once | INTRAVENOUS | Status: AC
  Administered 2016-02-14: 16:00:00 via INTRAVENOUS

## 2016-02-14 MED ORDER — VITAMIN D 1000 UNITS PO TABS
1000.0000 [IU] | ORAL_TABLET | Freq: Every day | ORAL | Status: DC
  Administered 2016-02-15 – 2016-02-22 (×8): 1000 [IU] via ORAL
  Filled 2016-02-14 (×8): qty 1

## 2016-02-14 MED ORDER — ACETAMINOPHEN 325 MG PO TABS
650.0000 mg | ORAL_TABLET | Freq: Four times a day (QID) | ORAL | Status: DC | PRN
  Administered 2016-02-14 – 2016-02-21 (×17): 650 mg via ORAL
  Filled 2016-02-14 (×19): qty 2

## 2016-02-14 NOTE — H&P (Signed)
History and Physical  Diamond Boyd:[id number] DOB: 1940/05/27 DOA: 02/14/2016   PCP: [name], MD    Patient coming from: Home  Chief Complaint: Epistaxis  HPI:  Diamond Boyd is a 76 y.o. female with medical history of colon cancer, Hodgkin's lymphoma, follicular B cell lymphoma in remission presents with two-week history of intermittent epistaxis. The patient states that over the last 2-3 days she has had 2 episodes of epistaxis that have lasted for a longer period of time, approximately hours in duration. Her most recent episode of epistaxis began around 5 AM on the day of admission. Because she continued to bleed, she presented to the emergency department for further evaluation. The patient states that she has been digitally manipulating her nares and pulling out the clots which has resulted in further bleeding on occasion. However, she states that there are other times that even without digital manipulation of her nurse, she spontaneously bleeds. The patient denies any dizziness, chest pain but complains of some mild dyspnea on exertion which has been unchanged in the past couple weeks. She states that her weights have been stable in the past 2 weeks. In fact, she states that she is at her lowest weight that she has been for some time. The patient states that her last weight was 221 pounds. She denies worsening leg edema, orthopnea, or increasing abdominal girth. She states that she has had some vomitus with blood, but states that this has only occurred after she has had epistaxis. In addition, she has occasional hemoptysis, but this only occurs in the setting for epistaxis. She denies any abdominal pain, dysuria, hematuria, hematochezia. She has noticed some "dark stools". In addition, the patient has been having some intermittent fevers for the last 48 hours ranging from 101.79F and 103F.  She denies any headache, neck pain, dysuria, hematuria, abdominal pain, back pain,  diarrhea. She denies any recent sick contacts. The patient denies any new prescription or over-the-counter medicines. She denies any NSAIDs.  In the emergency partner, the patient had a low-grade temperature of 99.79F but was hemodynamically stable saturating 96% on room air. The patient was given Afrin which helped stop her epistaxis. BMP showed a sodium 133 otherwise was unremarkable. CBC showed hemoglobin 9.1 with platelets 16,000.  Assessment/Plan: Epistaxis with worsening thrombocytopenia -The patient follows Dr. Alvy Bimler for pancytopenia. She has been consulted to follow the patient -Transfuse 1 unit of platelets -Dr. Alvy Bimler plans to give IVIG during this admission -Discussed the importance of avoiding digital manipulation of her nares -PTT 38 -INR 1.13 -If epistaxis becomes recurrent and unable to stop, plan to consult Dr. Redmond Baseman who is the patient's otolaryngologist  Fever with dyspnea/cough -check influenza pcr -respiratory viral panel -CXR -UA -no documented fever since admission -EKG  Acute blood loss anemia -Secondary to epistaxis -Baseline hemoglobin 10-11 -Presenting hemoglobin 9.1  Chronic systolic and diastolic CHF -42/70/6237 echo EF 45-50%, grade 2 DD -She is euvolemic clinically -Daily weights -Continue home dose furosemide -Losartan on hold secondary to soft blood pressure -Decreased dose of metoprolol tartrate secondary to soft blood pressure  Paroxysmal atrial fibrillation -Not a candidate for anticoagulation secondary to her pancytopenia -Presently in sinus rhythm -CHADSVASc = 6  Hypertension -Losartan and metoprolol tartrate as discussed above  Follicular B-cell lymphoma -Recent PET scan shows the patient is in remission without any sites concerning for hypermetabolism -Follow up with Dr. Alvy Bimler  Hematemesis/hemoptysis? -I feel this is likely resulting from blood from her epistaxis and  not true hematemesis or hemoptysis as these have occurred  only in the setting of her epistaxis -Monitor hemoglobin -FOBT  Lower extremity pain and edema -venous duplex r/o DVT  Asthma -Stable clinically -Continue Breo when necessary albuterol  Neuropathy secondary to chemotherapy -Continue gabapentin  Hypothyroidism -Continue Synthroid  Impaired glucose tolerance -10/24/2015 hemoglobin A1c 6.4 -will not start sliding scale       Past Medical History:  Diagnosis Date  . Anemia    hx blood tx  . Asthma   . Benign essential HTN   . Cancer ([place])    skin cancer- basal cell on arm / colon 2011  . Chronic systolic CHF (congestive heart failure), NYHA class 2 ([place]) 01/2015  . Colon cancer ([place])    2011, s/p surgery, in remission  . Colon polyps   . Degenerative disk disease    Thoracic spine  . Depression   . 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 ([place]) 12/11/2014   malignant B cell lymphoma- being treated actively  . Generalized weakness 05/11/2015  . GERD (gastroesophageal reflux disease)   . Heart murmur   . hodgkins lymphoma   . Hypothyroidism   . Lymphoma ([place])   . Neuropathy due to chemotherapeutic drug ([place]) 06/19/2014  . NICM (nonischemic cardiomyopathy) ([place]) 01/2015  . Osteoarthritis    hands  . 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 PAF (paroxysmal atrial fibrillation) ([place])   . Peripheral vascular disease ([place])   . Pneumonia    Past Surgical History:  Procedure Laterality Date  . BONE MARROW BIOPSY  05/27/13  . BREAST BIOPSY  1996  . CARDIAC CATHETERIZATION N/A 01/25/2015   Procedure: Left Heart Cath and Coronary Angiography;  Surgeon: Peter M Martinique, MD;  Location: [facility];  Service: Cardiovascular;  Laterality: N/A;  . CATARACT EXTRACTION, BILATERAL  2016  . CHOLECYSTECTOMY    . COLECTOMY  8/11  . COLONOSCOPY W/ BIOPSIES    . DG BIOPSY LUNG    . LYMPH NODE BIOPSY N/A 11/01/2014   Procedure: MEDIASTINAL LYMPH NODE BIOPSY;  Surgeon: Grace Isaac, MD;  Location: [facility];   Service: Thoracic;  Laterality: N/A;  . MEDIASTINOSCOPY N/A 11/01/2014   Procedure: MEDIASTINOSCOPY;  Surgeon: Grace Isaac, MD;  Location: [facility];  Service: Thoracic;  Laterality: N/A;  . PLANTAR FASCIA RELEASE    . port-a-cath insertion    . SPLENECTOMY     lymphoma  . TENDON RELEASE     Right thumb   . VAGINAL HYSTERECTOMY    . VENTRAL HERNIA REPAIR  1998  . VIDEO BRONCHOSCOPY WITH ENDOBRONCHIAL ULTRASOUND N/A 10/17/2014   Procedure: VIDEO BRONCHOSCOPY WITH ENDOBRONCHIAL ULTRASOUND;  Surgeon: Javier Glazier, MD;  Location: [facility];  Service: Thoracic;  Laterality: N/A;  . VIDEO BRONCHOSCOPY WITH ENDOBRONCHIAL ULTRASOUND N/A 11/01/2014   Procedure: VIDEO BRONCHOSCOPY WITH ENDOBRONCHIAL ULTRASOUND;  Surgeon: Grace Isaac, MD;  Location: [facility];  Service: Thoracic;  Laterality: N/A;   Social History:  reports that she has never smoked. She has never used smokeless tobacco. She reports that she does not drink alcohol or use drugs.   Family History  Problem Relation Age of Onset  . Coronary artery disease Mother   . Diabetes Mother   . Fibromyalgia Daughter     chronic pain   . COPD Daughter   . Diabetes Brother   . Asthma Daughter   . Rheum arthritis Brother   . Clotting disorder Brother   .  Alcohol abuse Sister   . Colon cancer Neg Hx   . Colon polyps Neg Hx   . Stomach cancer Neg Hx   . Rectal cancer Neg Hx   . Ulcerative colitis Neg Hx   . Crohn's disease Neg Hx      Allergies  Allergen Reactions  . Achromycin [Tetracycline Hcl] Other (See Comments)    Pt does not remember reaction  . Allopurinol Other (See Comments)    REACTION: Unsure of reaction happene years ago  . Astelin [Azelastine Hcl] Other (See Comments)    Reaction unknown  . Cephalexin Other (See Comments)    REACTION: unsure of reaction happened yrs ago.  . Codeine Other (See Comments)    REACTION: abd. pain  . Lisinopril Cough  . Meloxicam Other (See Comments)    REACTION: GI symptoms  .  Minocycline Other (See Comments)    Abdominal pain  . Nabumetone Other (See Comments)    REACTION: reaction not known  . Nyquil [Pseudoeph-Doxylamine-Dm-Apap] Hives  . Sulfa Antibiotics Other (See Comments)    Gi side eff   . Zolpidem Tartrate Other (See Comments)    REACTION: feels too drugged  . Buspar [Buspirone Hcl] Other (See Comments)    Dizziness, and not as effective for anxiety  . Ciprofloxacin Rash     Prior to Admission medications   Medication Sig Start Date End Date Taking? Authorizing Provider  acetaminophen (TYLENOL) 650 MG CR tablet Take 650 mg by mouth every 8 (eight) hours as needed for pain or fever.    Yes Historical Provider, MD  acyclovir (ZOVIRAX) 400 MG tablet Take 1 tablet (400 mg total) by mouth daily. Patient taking differently: Take 400 mg by mouth every morning.  10/29/15  Yes Abner Greenspan, MD  Albuterol Sulfate (PROAIR RESPICLICK) 703 (90 Base) MCG/ACT AEPB Inhale 1-2 puffs into the lungs every 6 (six) hours as needed (SOB, wheezing).    Yes Historical Provider, MD  BREO ELLIPTA 200-25 MCG/INH AEPB Inhale 1 puff into the lungs every evening.  11/22/15  Yes Historical Provider, MD  Calcium Carbonate-Vitamin D (CALCIUM 600+D) 600-400 MG-UNIT per tablet Take 1 tablet by mouth every evening.    Yes Historical Provider, MD  Cholecalciferol (VITAMIN D-3) 1000 UNITS CAPS Take 1,000 Units by mouth every morning.    Yes Historical Provider, MD  estradiol (ESTRACE) 1 MG tablet Take 1 tablet (1 mg total) by mouth daily. Patient taking differently: Take 1 mg by mouth every morning.  10/29/15  Yes Marne A Tower, MD  fexofenadine (ALLEGRA) 180 MG tablet TAKE 1 TABLET (180 MG TOTAL) BY MOUTH DAILY. (*OTC) 08/01/15  Yes Historical Provider, MD  FLUoxetine (PROZAC) 20 MG capsule Take 1 capsule (20 mg total) by mouth daily. Patient taking differently: Take 20 mg by mouth every morning.  10/29/15  Yes Abner Greenspan, MD  furosemide (LASIX) 20 MG tablet Take 1 tablet (20 mg total)  by mouth daily. Take 1 tab by mouth daily for weight gain. Patient taking differently: Take 20 mg by mouth every morning. Take 1 tab by mouth daily for weight gain. 10/29/15  Yes Abner Greenspan, MD  gabapentin (NEURONTIN) 300 MG capsule Take 1 capsule (300 mg total) by mouth 2 (two) times daily. 10/29/15  Yes [place], MD  Lactobacillus (PROBIOTIC ACIDOPHILUS) CAPS Take 1 capsule by mouth every evening.   Yes Historical Provider, MD  levothyroxine (SYNTHROID, LEVOTHROID) 112 MCG tablet Take 1 tablet (112 mcg total) by mouth daily before  breakfast. 10/29/15  Yes Abner Greenspan, MD  lidocaine-prilocaine (EMLA) cream Apply 1 application topically as needed (For port-a-cath.). Reported on 02/22/2015   Yes Historical Provider, MD  losartan (COZAAR) 25 MG tablet Take 1 tablet (25 mg total) by mouth daily. 10/29/15  Yes Abner Greenspan, MD  metoprolol (LOPRESSOR) 50 MG tablet TAKE 1 TABLET (50 MG TOTAL) BY MOUTH 2 (TWO) TIMES DAILY. 10/29/15  Yes Abner Greenspan, MD  Multiple Vitamins-Minerals (MULTIVITAMIN ADULTS PO) Take 1 tablet by mouth every evening.   Yes Historical Provider, MD  Omega-3 350 MG CAPS Take 1 capsule by mouth daily. Reported on 02/22/2015   Yes Historical Provider, MD  omeprazole (PRILOSEC) 40 MG capsule Take 40 mg by mouth at bedtime.  01/01/16  Yes Historical Provider, MD  ondansetron (ZOFRAN) 8 MG tablet Take 8 mg by mouth every 6 (six) hours as needed for nausea or vomiting. Reported on 03/05/2015 12/25/14  Yes Historical Provider, MD  prochlorperazine (COMPAZINE) 10 MG tablet Take 10 mg by mouth every 6 (six) hours as needed for nausea or vomiting. Reported on 03/05/2015 12/25/14  Yes Historical Provider, MD  simvastatin (ZOCOR) 40 MG tablet Take 1 tablet (40 mg total) by mouth at bedtime. 10/29/15  Yes Abner Greenspan, MD  acidophilus (RISAQUAD) CAPS capsule Take 1 capsule by mouth 3 (three) times daily. X 10 days Patient not taking: Reported on 02/14/2016 01/13/16   Gladstone Lighter, MD    LORazepam (ATIVAN) 0.5 MG tablet Take 1 tablet (0.5 mg total) by mouth every 12 (twelve) hours as needed for anxiety. Patient not taking: Reported on 02/14/2016 12/31/15   Susanne Borders, NP  predniSONE (DELTASONE) 20 MG tablet Take 1 tablet (20 mg total) by mouth daily with breakfast. Patient not taking: Reported on 02/14/2016 01/24/16   [name], MD  vancomycin (VANCOCIN) 50 mg/mL oral solution Take 10 mLs (500 mg total) by mouth every 6 (six) hours. X 12 more days Patient not taking: Reported on 02/14/2016 01/13/16   Gladstone Lighter, MD    Review of Systems:  Constitutional:  No weight loss, night sweats, Fevers, chills, fatigue.  Head&Eyes: No headache.  No vision loss.  No eye pain or scotoma ENT:  No Difficulty swallowing,Tooth/dental problems,Sore throat,  No ear ache.  Positive epistaxis Cardio-vascular:  No chest pain, Orthopnea, PND,,  dizziness, palpitations  GI:  No  abdominal pain,  diarrhea, loss of appetite, hematochezia, melena, heartburn, indigestion, Resp:  No shortness of breath with exertion or at rest. No cough. No coughing up of blood .No wheezing.No chest wall deformity  Skin:  no rash or lesions.  GU:  no dysuria, change in color of urine, no urgency or frequency. No flank pain.  Musculoskeletal:  No joint pain or swelling. No decreased range of motion. No back pain.  Psych:  No change in mood or affect. No depression or anxiety. Neurologic: No headache, no dysesthesia, no focal weakness, no vision loss. No syncope  Physical Exam: Vitals:   02/14/16 1128 02/14/16 1305  BP: 101/71 115/55  Pulse: 97 85  Resp: 16 16  Temp: 99 F (37.2 C)   TempSrc: Oral   SpO2: 96% 96%   General:  A&O x 3, NAD, nontoxic, pleasant/cooperative Head/Eye: No conjunctival hemorrhage, no icterus, [place]/AT, No nystagmus ENT:  No icterus,  No thrush, good dentition, no pharyngeal exudate; gauze in left nares Neck:  No masses, no lymphadenpathy, no bruits CV:  RRR, no rub, no  gallop, no S3 Lung:  CTAB, good air movement, no wheeze, no rhonchi Abdomen: soft/NT, +BS, nondistended, no peritoneal signs Ext: No cyanosis, No rashes, No petechiae, No lymphangitis, 1 + LE edema Neuro: CNII-XII intact, strength 4/5 in bilateral upper and lower extremities, no dysmetria  Labs on Admission:  Basic Metabolic Panel:  Recent Labs Lab 02/14/16 1221  NA 133*  K 4.4  CL 100*  CO2 27  GLUCOSE 122*  BUN 19  CREATININE 0.67  CALCIUM 8.9   Liver Function Tests: No results for input(s): AST, ALT, ALKPHOS, BILITOT, PROT, ALBUMIN in the last 168 hours. No results for input(s): LIPASE, AMYLASE in the last 168 hours. No results for input(s): AMMONIA in the last 168 hours. CBC:  Recent Labs Lab 02/14/16 1221  WBC 7.8  NEUTROABS 1.0*  HGB 9.1*  HCT 27.5*  MCV 96.2  PLT 16*   Coagulation Profile: No results for input(s): INR, PROTIME in the last 168 hours. Cardiac Enzymes: No results for input(s): CKTOTAL, CKMB, CKMBINDEX, TROPONINI in the last 168 hours. BNP: Invalid input(s): POCBNP CBG: No results for input(s): GLUCAP in the last 168 hours. Urine analysis:    Component Value Date/Time   COLORURINE YELLOW (A) 01/08/2016 1036   APPEARANCEUR HAZY (A) 01/08/2016 1036   LABSPEC 1.011 01/08/2016 1036   LABSPEC 1.005 01/10/2015 1045   PHURINE 6.0 01/08/2016 1036   GLUCOSEU NEGATIVE 01/08/2016 1036   GLUCOSEU Negative 01/10/2015 1045   HGBUR 1+ (A) 01/08/2016 1036   BILIRUBINUR NEGATIVE 01/08/2016 1036   BILIRUBINUR Negative 01/10/2015 1045   KETONESUR NEGATIVE 01/08/2016 1036   PROTEINUR NEGATIVE 01/08/2016 1036   UROBILINOGEN 0.2 01/10/2015 1045   NITRITE NEGATIVE 01/08/2016 1036   LEUKOCYTESUR 1+ (A) 01/08/2016 1036   LEUKOCYTESUR Negative 01/10/2015 1045   Sepsis Labs: @LABRCNTIP (procalcitonin:4,lacticidven:4) )No results found for this or any previous visit (from the past 240 hour(s)).   Radiological Exams on Admission: No results found.  EKG:  Independently reviewed. pending    Time spent:60 minutes Code Status:   FULL Family Communication:  Spouse updated at bedside Disposition Plan: expect 1-2 day hospitalization Consults called: Hematology--Dr. Alvy Bimler DVT Prophylaxis: SCDs  Axie Hayne, DO  [facility] Pager [phone]  If 7PM-7AM, please contact night-coverage [email or web address] Password [email or web address] 02/14/2016, 1:56 PM

## 2016-02-14 NOTE — ED Notes (Signed)
RN is starting IV line and blood work

## 2016-02-14 NOTE — ED Notes (Signed)
ED Provider at bedside. 

## 2016-02-14 NOTE — Procedures (Signed)
Preop diagnosis: Left epistaxis Postop diagnosis: Left posterior epistaxis Procedure: Left anterior and posterior nasal packing Surgeon: Redmond Baseman Anesth: None Compl: None Findings: A large clot was removed from the left nasal passage.  There was no active bleeding or clear source of bleeding on anterior rhinoscopy. Description: After discussing risks, benefits, and alternatives, the patient was placed in a seated position.  The left-sided nasal gauze was removed.  The left nasal passage was examined with a nasal speculum and headlight.  The clot was removed using suction and forceps.  There was no active bleeding or source of bleeding identified.  The left nasal passage was packed first with cotton soaked with Afrin.  After a couple of minutes, the cotton was removed and an 8 cm Merocel pack coated in Bactroban ointment was placed fully into the left nasal passage and then saturated with Afrin.  The string was taped to the left cheek.  She tolerated the procedure well.

## 2016-02-14 NOTE — ED Notes (Signed)
Hospitalist at bedside 

## 2016-02-14 NOTE — ED Provider Notes (Signed)
[facility] DEPT Provider Note   CSN: [id number] Arrival date & time: 02/14/16  1118     History   Chief Complaint Chief Complaint  Patient presents with  . Epistaxis    HPI Diamond Boyd is a 76 y.o. female.  HPI Diamond Boyd is a 76 y.o. female with PMH significant for anemia requiring blood transfusion, follicular lymphoma, and epistaxis secondary to thrombocytopenia requiring platelet transfusion who presents with left nare nosebleed that began approximately 5 AM this morning.  She reports passing clots.  She has used Afrin with some relief.  Currently there is no bleeding.  She endorses mild lightheadedness.  No syncope.  She states she feels like there is a big clot in her nose, and upon blowing she passed a very large clot.  She is currently receiving monthly IVIG, last dose 4 weeks ago, and she was scheduled for follow up appointment tomorrow.  Past Medical History:  Diagnosis Date  . Anemia    hx blood tx  . Asthma   . Benign essential HTN   . Cancer ([place])    skin cancer- basal cell on arm / colon 2011  . Chronic systolic CHF (congestive heart failure), NYHA class 2 ([place]) 01/2015  . Colon cancer ([place])    2011, s/p surgery, in remission  . Colon polyps   . Degenerative disk disease    Thoracic spine  . Depression   . 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 Generalized weakness 05/11/2015  . GERD (gastroesophageal reflux disease)   . Heart murmur   . hodgkins lymphoma   . Hypothyroidism   . Lymphoma ([place])   . Neuropathy due to chemotherapeutic drug ([place]) 06/19/2014  . NICM (nonischemic cardiomyopathy) ([place]) 01/2015  . Osteoarthritis    hands  . 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 PAF (paroxysmal atrial fibrillation) ([place])   . Peripheral vascular disease ([place])   . Pneumonia     Patient Active Problem List   Diagnosis Date Noted  . Acute blood loss anemia  02/14/2016  . Chronic combined systolic and diastolic CHF (congestive heart failure) ([place]) 02/14/2016  . Pancytopenia, acquired ([place]) 01/17/2016  . Dehydration 12/31/2015  . Neoplastic (malignant) related fatigue 12/31/2015  . Unintentional weight loss 12/31/2015  . Epistaxis 12/31/2015  . Hypoalbuminemia due to protein-calorie malnutrition ([place]) 12/31/2015  . C. difficile colitis   . Colitis 12/11/2015  . Basal cell carcinoma 10/31/2015  . Neuropathy ([place]) 10/31/2015  . S/P cataract surgery 10/31/2015  . Chronic rhinitis 10/31/2015  . Moderate persistent asthma 10/31/2015  . Physical deconditioning 10/16/2015  . Chronic ethmoidal sinusitis 09/18/2015  . 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 Cough 07/18/2015  . Port catheter in place 07/11/2015  . Generalized weakness 05/11/2015  . Benign essential HTN   . PAF (paroxysmal atrial fibrillation) ([place])   . Internal hemorrhoids 02/21/2015  . Rectal bleeding 02/06/2015  . 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 Thrombocytopenia ([place]) 01/29/2015  . AKI (acute kidney injury) ([place])   . Elevated troponin   . Atrial fibrillation with rapid ventricular response ([place]) 01/23/2015  . New onset a-fib ([place]) 01/23/2015  . Hypertension 01/23/2015  . CHF (congestive heart failure) ([place]) 01/21/2015  . Chronic systolic CHF (congestive heart failure), NYHA class 2 ([place]) 01/11/2015  . Fever in adult 01/10/2015  . Immunocompromised status associated with infection ([place]) 01/08/2015  . Grade 3b follicular lymphoma of lymph  nodes of multiple regions ([place]) 12/28/2014  . Follicular lymphoma of lymph nodes of multiple regions ([place]) 12/11/2014  . Preventive measure 11/14/2014  . Parotid mass 11/14/2014  . Chronic lower back pain 09/28/2014  . Mediastinal lymphadenopathy 09/27/2014  . Neuropathy due to chemotherapeutic drug ([place]) 06/19/2014  . Peripheral neuropathy due to chemotherapy ([place]) 04/03/2014  . Trochanteric bursitis of right hip  04/03/2014  . Anemia in chronic illness 02/21/2014  . Elevated liver enzymes 02/21/2014  . Urinary tract infection 11/18/2013  . Candidal intertrigo 09/23/2013  . Atrophic vaginitis 09/23/2013  . Drug-induced neutropenia ([place]) 09/01/2013  . History of colon cancer 06/17/2013  . History of Hodgkin's lymphoma 05/20/2013  . Conjunctivitis, acute 04/27/2013  . Cold sore 04/27/2013  . Hyperglycemia 03/11/2013  . Hodgkin lymphoma ([place]) 02/10/2013  . Acute maxillary sinusitis 02/09/2013  . Other malaise and fatigue 01/04/2013  . Shingles 11/22/2012  . Cystocele 09/22/2012  . Encounter for Medicare annual wellness exam 09/17/2012  . Left knee pain 09/10/2012  . Thoracic back pain 06/01/2012  . Skin lesion of face 05/17/2012  . Other screening mammogram 07/22/2011  . Routine gynecological examination 07/22/2011  . Colon cancer screening 07/22/2011  . History of anemia 05/06/2011  . 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 Hypothyroid 02/24/2011  . Varicose veins 02/24/2011  . Obesity 11/20/2010  . Colon cancer ([place]) 08/23/2009  . Hyperphosphatemia 08/09/2009  . Chronic maxillary sinusitis 07/03/2009  . THROAT PAIN, CHRONIC 07/03/2009  . S/P colon resection 02/10/2009  . GERD 12/20/2008  . Anxiety state 08/22/2008  . MENOPAUSAL SYNDROME 07/13/2007  . High cholesterol 07/08/2006  . Allergic rhinitis 07/08/2006  . OVERACTIVE BLADDER 07/08/2006  . 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 HYPOTHYROIDISM 06/25/2006  . Depression 06/25/2006  . PERIPHERAL VASCULAR DISEASE 06/25/2006  . OSTEOARTHRITIS 06/25/2006  . LEG EDEMA 06/25/2006  . SKIN CANCER, HX OF 06/25/2006  . COLONIC POLYPS, HX OF 06/25/2006    Past Surgical History:  Procedure Laterality Date  . BONE MARROW BIOPSY  05/27/13  . BREAST BIOPSY  1996  . CARDIAC CATHETERIZATION N/A 01/25/2015   Procedure: Left Heart Cath and Coronary Angiography;  Surgeon: Peter M Martinique, MD;  Location: [facility];  Service:  Cardiovascular;  Laterality: N/A;  . CATARACT EXTRACTION, BILATERAL  2016  . CHOLECYSTECTOMY    . COLECTOMY  8/11  . COLONOSCOPY W/ BIOPSIES    . DG BIOPSY LUNG    . LYMPH NODE BIOPSY N/A 11/01/2014   Procedure: MEDIASTINAL LYMPH NODE BIOPSY;  Surgeon: Grace Isaac, MD;  Location: [facility];  Service: Thoracic;  Laterality: N/A;  . MEDIASTINOSCOPY N/A 11/01/2014   Procedure: MEDIASTINOSCOPY;  Surgeon: Grace Isaac, MD;  Location: [facility];  Service: Thoracic;  Laterality: N/A;  . PLANTAR FASCIA RELEASE    . port-a-cath insertion    . SPLENECTOMY     lymphoma  . TENDON RELEASE     Right thumb   . VAGINAL HYSTERECTOMY    . VENTRAL HERNIA REPAIR  1998  . VIDEO BRONCHOSCOPY WITH ENDOBRONCHIAL ULTRASOUND N/A 10/17/2014   Procedure: VIDEO BRONCHOSCOPY WITH ENDOBRONCHIAL ULTRASOUND;  Surgeon: Javier Glazier, MD;  Location: [facility];  Service: Thoracic;  Laterality: N/A;  . VIDEO BRONCHOSCOPY WITH ENDOBRONCHIAL ULTRASOUND N/A 11/01/2014   Procedure: VIDEO BRONCHOSCOPY WITH ENDOBRONCHIAL ULTRASOUND;  Surgeon: Grace Isaac, MD;  Location: [facility];  Service: Thoracic;  Laterality: N/A;    OB History    No data available  Home Medications    Prior to Admission medications   Medication Sig Start Date End Date Taking? Authorizing Provider  acetaminophen (TYLENOL) 650 MG CR tablet Take 650 mg by mouth every 8 (eight) hours as needed for pain or fever.    Yes Historical Provider, MD  acyclovir (ZOVIRAX) 400 MG tablet Take 1 tablet (400 mg total) by mouth daily. Patient taking differently: Take 400 mg by mouth every morning.  10/29/15  Yes Abner Greenspan, MD  Albuterol Sulfate (PROAIR RESPICLICK) 809 (90 Base) MCG/ACT AEPB Inhale 1-2 puffs into the lungs every 6 (six) hours as needed (SOB, wheezing).    Yes Historical Provider, MD  BREO ELLIPTA 200-25 MCG/INH AEPB Inhale 1 puff into the lungs every evening.  11/22/15  Yes Historical Provider, MD  Calcium Carbonate-Vitamin D (CALCIUM 600+D)  600-400 MG-UNIT per tablet Take 1 tablet by mouth every evening.    Yes Historical Provider, MD  Cholecalciferol (VITAMIN D-3) 1000 UNITS CAPS Take 1,000 Units by mouth every morning.    Yes Historical Provider, MD  estradiol (ESTRACE) 1 MG tablet Take 1 tablet (1 mg total) by mouth daily. Patient taking differently: Take 1 mg by mouth every morning.  10/29/15  Yes Marne A Tower, MD  fexofenadine (ALLEGRA) 180 MG tablet TAKE 1 TABLET (180 MG TOTAL) BY MOUTH DAILY. (*OTC) 08/01/15  Yes Historical Provider, MD  FLUoxetine (PROZAC) 20 MG capsule Take 1 capsule (20 mg total) by mouth daily. Patient taking differently: Take 20 mg by mouth every morning.  10/29/15  Yes Abner Greenspan, MD  furosemide (LASIX) 20 MG tablet Take 1 tablet (20 mg total) by mouth daily. Take 1 tab by mouth daily for weight gain. Patient taking differently: Take 20 mg by mouth every morning. Take 1 tab by mouth daily for weight gain. 10/29/15  Yes Abner Greenspan, MD  gabapentin (NEURONTIN) 300 MG capsule Take 1 capsule (300 mg total) by mouth 2 (two) times daily. 10/29/15  Yes [place], MD  Lactobacillus (PROBIOTIC ACIDOPHILUS) CAPS Take 1 capsule by mouth every evening.   Yes Historical Provider, MD  levothyroxine (SYNTHROID, LEVOTHROID) 112 MCG tablet Take 1 tablet (112 mcg total) by mouth daily before breakfast. 10/29/15  Yes Abner Greenspan, MD  lidocaine-prilocaine (EMLA) cream Apply 1 application topically as needed (For port-a-cath.). Reported on 02/22/2015   Yes Historical Provider, MD  losartan (COZAAR) 25 MG tablet Take 1 tablet (25 mg total) by mouth daily. 10/29/15  Yes Abner Greenspan, MD  metoprolol (LOPRESSOR) 50 MG tablet TAKE 1 TABLET (50 MG TOTAL) BY MOUTH 2 (TWO) TIMES DAILY. 10/29/15  Yes Abner Greenspan, MD  Multiple Vitamins-Minerals (MULTIVITAMIN ADULTS PO) Take 1 tablet by mouth every evening.   Yes Historical Provider, MD  Omega-3 350 MG CAPS Take 1 capsule by mouth daily. Reported on 02/22/2015   Yes Historical  Provider, MD  omeprazole (PRILOSEC) 40 MG capsule Take 40 mg by mouth at bedtime.  01/01/16  Yes Historical Provider, MD  ondansetron (ZOFRAN) 8 MG tablet Take 8 mg by mouth every 6 (six) hours as needed for nausea or vomiting. Reported on 03/05/2015 12/25/14  Yes Historical Provider, MD  prochlorperazine (COMPAZINE) 10 MG tablet Take 10 mg by mouth every 6 (six) hours as needed for nausea or vomiting. Reported on 03/05/2015 12/25/14  Yes Historical Provider, MD  simvastatin (ZOCOR) 40 MG tablet Take 1 tablet (40 mg total) by mouth at bedtime. 10/29/15  Yes Abner Greenspan, MD  acidophilus (RISAQUAD)  CAPS capsule Take 1 capsule by mouth 3 (three) times daily. X 10 days Patient not taking: Reported on 02/14/2016 01/13/16   Gladstone Lighter, MD  LORazepam (ATIVAN) 0.5 MG tablet Take 1 tablet (0.5 mg total) by mouth every 12 (twelve) hours as needed for anxiety. Patient not taking: Reported on 02/14/2016 12/31/15   Susanne Borders, NP  predniSONE (DELTASONE) 20 MG tablet Take 1 tablet (20 mg total) by mouth daily with breakfast. Patient not taking: Reported on 02/14/2016 01/24/16   [name], MD  vancomycin (VANCOCIN) 50 mg/mL oral solution Take 10 mLs (500 mg total) by mouth every 6 (six) hours. X 12 more days Patient not taking: Reported on 02/14/2016 01/13/16   Gladstone Lighter, MD    Family History Family History  Problem Relation Age of Onset  . Coronary artery disease Mother   . Diabetes Mother   . Fibromyalgia Daughter     chronic pain   . COPD Daughter   . Diabetes Brother   . Asthma Daughter   . Rheum arthritis Brother   . Clotting disorder Brother   . Alcohol abuse Sister   . Colon cancer Neg Hx   . Colon polyps Neg Hx   . Stomach cancer Neg Hx   . Rectal cancer Neg Hx   . Ulcerative colitis Neg Hx   . Crohn's disease Neg Hx     Social History Social History  Substance Use Topics  . Smoking status: Never Smoker  . Smokeless tobacco: Never Used     Comment: Second-hand exposure  through father.  . Alcohol use No     Allergies   Achromycin [tetracycline hcl]; Allopurinol; Astelin [azelastine hcl]; Cephalexin; Codeine; Lisinopril; Meloxicam; Minocycline; Nabumetone; Nyquil [pseudoeph-doxylamine-dm-apap]; Sulfa antibiotics; Zolpidem tartrate; Buspar [buspirone hcl]; and Ciprofloxacin   Review of Systems Review of Systems All other systems negative unless otherwise stated in HPI   Physical Exam Updated Vital Signs BP 115/55 (BP Location: Left Arm)   Pulse 85   Temp 99 F (37.2 C) (Oral)   Resp 16   LMP 02/10/1970   SpO2 96%   Physical Exam  Constitutional: She is oriented to person, place, and time. She appears well-developed and well-nourished.  Non-toxic appearance. She does not have a sickly appearance. She does not appear ill.  HENT:  Head: Normocephalic and atraumatic.  Nose: Epistaxis is observed.  Mouth/Throat: Oropharynx is clear and moist.  Dried blood in left nare without obvious visualization of source.  No active bleeding.  She passed a golf ball sized clot when blowing her nose.   Eyes: Conjunctivae are normal.  Neck: Normal range of motion. Neck supple.  Cardiovascular: Normal rate and regular rhythm.   Pulmonary/Chest: Effort normal and breath sounds normal. No accessory muscle usage or stridor. No respiratory distress. She has no wheezes. She has no rhonchi. She has no rales.  Abdominal: Soft. Bowel sounds are normal. She exhibits no distension. There is no tenderness.  Musculoskeletal: Normal range of motion.  Lymphadenopathy:    She has no cervical adenopathy.  Neurological: She is alert and oriented to person, place, and time.  Speech clear without dysarthria.  Skin: Skin is warm and dry.  Psychiatric: She has a normal mood and affect. Her behavior is normal.     ED Treatments / Results  Labs (all labs ordered are listed, but only abnormal results are displayed) Labs Reviewed  CBC WITH DIFFERENTIAL/PLATELET - Abnormal; Notable  for the following:       Result Value  RBC 2.86 (*)    Hemoglobin 9.1 (*)    HCT 27.5 (*)    RDW 20.2 (*)    Platelets 16 (*)    Neutro Abs 1.0 (*)    Lymphs Abs 5.6 (*)    Monocytes Absolute 1.2 (*)    All other components within normal limits  BASIC METABOLIC PANEL - Abnormal; Notable for the following:    Sodium 133 (*)    Chloride 100 (*)    Glucose, Bld 122 (*)    All other components within normal limits  APTT - Abnormal; Notable for the following:    aPTT 38 (*)    All other components within normal limits  PROTIME-INR    EKG  EKG Interpretation None       Radiology No results found.  Procedures Procedures (including critical care time)  Medications Ordered in ED Medications  oxymetazoline (AFRIN) 0.05 % nasal spray 1 spray (1 spray Each Nare Given 02/14/16 1318)     Initial Impression / Assessment and Plan / ED Course  I have reviewed the triage vital signs and the nursing notes.  Pertinent labs & imaging results that were available during my care of the patient were reviewed by me and considered in my medical decision making (see chart for details).  Clinical Course    Patient with hx of thrombocytopenia requiring platelet transfusion presents today with left nare epistaxis that has been controlled with Afrin.  She is hemodynamically stable.  Currently receives monthly IVIG.  Hx of chronic sinusitis which is likely the culprit which is compounded with her thrombocytopenia causing this epistaxis.  Platelet count 16, hgb stable.  Appreciate Dr. Calton Dach consultation, she recommends admission for platelet transfusion due to active bleeding and she will receive her IVIG inpatient tomorrow.  Appreciate [facility] admission.  Final Clinical Impressions(s) / ED Diagnoses   Final diagnoses:  Epistaxis  Thrombocytopenia [facility])    New Prescriptions New Prescriptions   No medications on file     Gloriann Loan, PA-C 02/14/16 [place], MD 02/16/16  1202

## 2016-02-14 NOTE — ED Triage Notes (Signed)
Pt reports nosebleed since 0500. Minimal bleeding at present. Pt reports low platelet levels since last chemo this summer. Last platelet transfusion end of November. Not on blood thinners.

## 2016-02-14 NOTE — Consult Note (Signed)
Reason for Consult: Epistaxis Referring Physician: Dr. Phylliss Blakes is an 76 y.o. female.  HPI: 76 year old female well known to me due to chronic sinusitis in context of treatment for Hodgkin's lymphoma.  She has had ongoing problems with sinusitis symptoms requiring multiple rounds of antibiotics with brief improvement in symptoms.  Sinus surgery was contemplated but she developed C. Diff infection that required treatment.  She has appropriately postponed sinus surgery to recover.  She has been dealing with thrombocytopenia related to lymphoma therapy including requiring platelet infusion in November.  She was admitted today due to nosebleeds that have been intermittent for the past couple of weeks, worse in the past 2-3 days.  Bleeding is left-sided and can last for hours.  She was admitted with a platelet count of 16.  Platelet infusion is pending and Dr. Alvy Bimler evidently plans to give her IVIG as well.  Bleeding has improved since admission.  Past Medical History:  Diagnosis Date  . Anemia    hx blood tx  . Asthma   . Benign essential HTN   . Cancer ([place])    skin cancer- basal cell on arm / colon 2011  . Chronic systolic CHF (congestive heart failure), NYHA class 2 ([place]) 01/2015  . Colon cancer ([place])    2011, s/p surgery, in remission  . Colon polyps   . Degenerative disk disease    Thoracic spine  . Depression   . 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 Generalized weakness 05/11/2015  . GERD (gastroesophageal reflux disease)   . Heart murmur   . hodgkins lymphoma   . Hypothyroidism   . Lymphoma ([place])   . Neuropathy due to chemotherapeutic drug ([place]) 06/19/2014  . NICM (nonischemic cardiomyopathy) ([place]) 01/2015  . Osteoarthritis    hands  . 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 PAF (paroxysmal atrial fibrillation) ([place])   . Peripheral vascular disease ([place])   . Pneumonia      Past Surgical History:  Procedure Laterality Date  . BONE MARROW BIOPSY  05/27/13  . BREAST BIOPSY  1996  . CARDIAC CATHETERIZATION N/A 01/25/2015   Procedure: Left Heart Cath and Coronary Angiography;  Surgeon: Peter M Martinique, MD;  Location: [facility];  Service: Cardiovascular;  Laterality: N/A;  . CATARACT EXTRACTION, BILATERAL  2016  . CHOLECYSTECTOMY    . COLECTOMY  8/11  . COLONOSCOPY W/ BIOPSIES    . DG BIOPSY LUNG    . LYMPH NODE BIOPSY N/A 11/01/2014   Procedure: MEDIASTINAL LYMPH NODE BIOPSY;  Surgeon: Grace Isaac, MD;  Location: [facility];  Service: Thoracic;  Laterality: N/A;  . MEDIASTINOSCOPY N/A 11/01/2014   Procedure: MEDIASTINOSCOPY;  Surgeon: Grace Isaac, MD;  Location: [facility];  Service: Thoracic;  Laterality: N/A;  . PLANTAR FASCIA RELEASE    . port-a-cath insertion    . SPLENECTOMY     lymphoma  . TENDON RELEASE     Right thumb   . VAGINAL HYSTERECTOMY    . VENTRAL HERNIA REPAIR  1998  . VIDEO BRONCHOSCOPY WITH ENDOBRONCHIAL ULTRASOUND N/A 10/17/2014   Procedure: VIDEO BRONCHOSCOPY WITH ENDOBRONCHIAL ULTRASOUND;  Surgeon: Javier Glazier, MD;  Location: [facility];  Service: Thoracic;  Laterality: N/A;  . VIDEO BRONCHOSCOPY WITH ENDOBRONCHIAL ULTRASOUND N/A 11/01/2014   Procedure: VIDEO BRONCHOSCOPY WITH ENDOBRONCHIAL ULTRASOUND;  Surgeon: Grace Isaac, MD;  Location: [facility];  Service: Thoracic;  Laterality: N/A;    Family History  Problem Relation Age of Onset  . Coronary artery disease Mother   . Diabetes Mother   . Fibromyalgia Daughter     chronic pain   . COPD Daughter   . Diabetes Brother   . Asthma Daughter   . Rheum arthritis Brother   . Clotting disorder Brother   . Alcohol abuse Sister   . Colon cancer Neg Hx   . Colon polyps Neg Hx   . Stomach cancer Neg Hx   . Rectal cancer Neg Hx   . Ulcerative colitis Neg Hx   . Crohn's disease Neg Hx     Social History:  reports that she has never smoked. She has never used smokeless  tobacco. She reports that she does not drink alcohol or use drugs.  Allergies:  Allergies  Allergen Reactions  . Achromycin [Tetracycline Hcl] Other (See Comments)    Pt does not remember reaction  . Allopurinol Other (See Comments)    REACTION: Unsure of reaction happene years ago  . Astelin [Azelastine Hcl] Other (See Comments)    Reaction unknown  . Cephalexin Other (See Comments)    REACTION: unsure of reaction happened yrs ago.  . Codeine Other (See Comments)    REACTION: abd. pain  . Lisinopril Cough  . Meloxicam Other (See Comments)    REACTION: GI symptoms  . Minocycline Other (See Comments)    Abdominal pain  . Nabumetone Other (See Comments)    REACTION: reaction not known  . Nyquil [Pseudoeph-Doxylamine-Dm-Apap] Hives  . Sulfa Antibiotics Other (See Comments)    Gi side eff   . Zolpidem Tartrate Other (See Comments)    REACTION: feels too drugged  . Buspar [Buspirone Hcl] Other (See Comments)    Dizziness, and not as effective for anxiety  . Ciprofloxacin Rash    Medications: I have reviewed the patient's current medications.  Results for orders placed or performed during the hospital encounter of 02/14/16 (from the past 48 hour(s))  CBC with Differential     Status: Abnormal   Collection Time: 02/14/16 12:21 PM  Result Value Ref Range   WBC 7.8 4.0 - 10.5 K/uL   RBC 2.86 (L) 3.87 - 5.11 MIL/uL   Hemoglobin 9.1 (L) 12.0 - 15.0 g/dL   HCT 27.5 (L) 36.0 - 46.0 %   MCV 96.2 78.0 - 100.0 fL   MCH 31.8 26.0 - 34.0 pg   MCHC 33.1 30.0 - 36.0 g/dL   RDW 20.2 (H) 11.5 - 15.5 %   Platelets 16 (LL) 150 - 400 K/uL    Comment: SPECIMEN CHECKED FOR CLOTS REPEATED TO VERIFY PLATELET COUNT CONFIRMED BY SMEAR CRITICAL RESULT CALLED TO, READ BACK BY AND VERIFIED WITH: KESSLER,R. RN _0  ON 1.4.17 BY NMCCOY CORRECT DATE OF CALL 02/14/2016    Neutrophils Relative % 13 %   Lymphocytes Relative 71 %   Monocytes Relative 16 %   Eosinophils Relative 0 %   Basophils  Relative 0 %   Neutro Abs 1.0 (L) 1.7 - 7.7 K/uL   Lymphs Abs 5.6 (H) 0.7 - 4.0 K/uL   Monocytes Absolute 1.2 (H) 0.1 - 1.0 K/uL   Eosinophils Absolute 0.0 0.0 - 0.7 K/uL   Basophils Absolute 0.0 0.0 - 0.1 K/uL  Basic metabolic panel     Status: Abnormal   Collection Time: 02/14/16 12:21 PM  Result Value Ref Range   Sodium 133 (L) 135 - 145 mmol/L   Potassium  4.4 3.5 - 5.1 mmol/L   Chloride 100 (L) 101 - 111 mmol/L   CO2 27 22 - 32 mmol/L   Glucose, Bld 122 (H) 65 - 99 mg/dL   BUN 19 6 - 20 mg/dL   Creatinine, Ser 0.67 0.44 - 1.00 mg/dL   Calcium 8.9 8.9 - 10.3 mg/dL   GFR calc non Af Amer >60 >60 mL/min   GFR calc Af Amer >60 >60 mL/min    Comment: (NOTE) The eGFR has been calculated using the CKD EPI equation. This calculation has not been validated in all clinical situations. eGFR's persistently <60 mL/min signify possible Chronic Kidney Disease.    Anion gap 6 5 - 15  Protime-INR     Status: None   Collection Time: 02/14/16 12:21 PM  Result Value Ref Range   Prothrombin Time 14.6 11.4 - 15.2 seconds   INR 1.13   APTT     Status: Abnormal   Collection Time: 02/14/16 12:21 PM  Result Value Ref Range   aPTT 38 (H) 24 - 36 seconds    Comment:        IF BASELINE aPTT IS ELEVATED, SUGGEST PATIENT RISK ASSESSMENT BE USED TO DETERMINE APPROPRIATE ANTICOAGULANT THERAPY.   Prepare Pheresed Platelets     Status: None (Preliminary result)   Collection Time: 02/14/16  5:15 PM  Result Value Ref Range   Blood Product Unit Number [id number]    Unit Type and Rh 6200    Blood Product Expiration Date [id number]    *Note: Due to a large number of results and/or encounters for the requested time period, some results have not been displayed. A complete set of results can be found in Results Review.    Dg Chest 2 View  Result Date: 02/14/2016 CLINICAL DATA:  Shortness of breath, weakness, and chest pain. History of asthma, CHF, EXAM: CHEST  2 VIEW COMPARISON:  Chest x-ray of  January 08 2016 FINDINGS: The lungs are mildly hyperinflated with mild hemidiaphragm flattening. There is no focal infiltrate. There is no pleural effusion. The heart and pulmonary vascularity are normal. The power port catheter tip projects over the midportion of the SVC. There is calcification in the wall of the aortic arch. The bony thorax exhibits no acute abnormality. There is mild multilevel degenerative disc disease of the thoracic spine. IMPRESSION: Mild chronic bronchitic change. No pneumonia, CHF, nor other acute cardiopulmonary abnormality. Thoracic aortic atherosclerosis. Electronically Signed   By: David  Martinique M.D.   On: 02/14/2016 17:28    Review of Systems  HENT: Positive for nosebleeds.   All other systems reviewed and are negative.  Blood pressure 139/76, pulse 88, temperature 98.7 F (37.1 C), temperature source Oral, resp. rate 16, height '5\' 5"'$  (1.651 m), weight 219 lb (99.3 kg), last menstrual period 02/10/1970, SpO2 97 %. Physical Exam  Constitutional: She is oriented to person, place, and time. She appears well-developed and well-nourished. No distress.  HENT:  Head: Normocephalic and atraumatic.  Right Ear: External ear normal.  Left Ear: External ear normal.  Mouth/Throat: Oropharynx is clear and moist.  Bloody gauze in left nasal ala.  No active bleeding.  Dry blood in right nasal passage against septum.  Eyes: Conjunctivae and EOM are normal. Pupils are equal, round, and reactive to light.  Neck: Normal range of motion. Neck supple.  Cardiovascular: Normal rate.   Respiratory: Effort normal.  Musculoskeletal: Normal range of motion.  Neurological: She is alert and oriented to person, place, and time. No cranial  nerve deficit.  Skin: Skin is warm and dry.  Psychiatric: She has a normal mood and affect. Her behavior is normal. Judgment and thought content normal.    Assessment/Plan: Left epistaxis and thrombocytopenia See procedure note for details of pack  placement.  A pack was placed in the left nasal passage to help control bleeding while thrombocytopenia is addressed.  I will remove the pack Monday, whether in the hospital or as an outpatient.  I would recommend antibiotic therapy with Keflex or similar while pack is in place.  She should spray the pack with saline spray every 2-4 hours.  Gerald Kuehl 02/14/2016, 6:10 PM

## 2016-02-15 ENCOUNTER — Ambulatory Visit (HOSPITAL_COMMUNITY): Payer: Medicare Other

## 2016-02-15 ENCOUNTER — Ambulatory Visit: Payer: Medicare Other

## 2016-02-15 ENCOUNTER — Ambulatory Visit: Payer: Medicare Other | Admitting: Hematology and Oncology

## 2016-02-15 ENCOUNTER — Other Ambulatory Visit: Payer: Medicare Other

## 2016-02-15 ENCOUNTER — Observation Stay (HOSPITAL_BASED_OUTPATIENT_CLINIC_OR_DEPARTMENT_OTHER): Payer: Medicare Other

## 2016-02-15 DIAGNOSIS — I447 Left bundle-branch block, unspecified: Secondary | ICD-10-CM | POA: Diagnosis not present

## 2016-02-15 DIAGNOSIS — B9789 Other viral agents as the cause of diseases classified elsewhere: Secondary | ICD-10-CM | POA: Diagnosis present

## 2016-02-15 DIAGNOSIS — R609 Edema, unspecified: Secondary | ICD-10-CM | POA: Diagnosis not present

## 2016-02-15 DIAGNOSIS — I110 Hypertensive heart disease with heart failure: Secondary | ICD-10-CM | POA: Diagnosis not present

## 2016-02-15 DIAGNOSIS — C8190 Hodgkin lymphoma, unspecified, unspecified site: Secondary | ICD-10-CM | POA: Diagnosis not present

## 2016-02-15 DIAGNOSIS — D62 Acute posthemorrhagic anemia: Secondary | ICD-10-CM | POA: Diagnosis not present

## 2016-02-15 DIAGNOSIS — Z01810 Encounter for preprocedural cardiovascular examination: Secondary | ICD-10-CM | POA: Diagnosis not present

## 2016-02-15 DIAGNOSIS — D61818 Other pancytopenia: Secondary | ICD-10-CM | POA: Diagnosis not present

## 2016-02-15 DIAGNOSIS — I5042 Chronic combined systolic (congestive) and diastolic (congestive) heart failure: Secondary | ICD-10-CM | POA: Diagnosis not present

## 2016-02-15 DIAGNOSIS — I509 Heart failure, unspecified: Secondary | ICD-10-CM | POA: Diagnosis not present

## 2016-02-15 DIAGNOSIS — R040 Epistaxis: Secondary | ICD-10-CM | POA: Diagnosis not present

## 2016-02-15 DIAGNOSIS — I480 Paroxysmal atrial fibrillation: Secondary | ICD-10-CM | POA: Diagnosis not present

## 2016-02-15 DIAGNOSIS — E039 Hypothyroidism, unspecified: Secondary | ICD-10-CM | POA: Diagnosis present

## 2016-02-15 DIAGNOSIS — D689 Coagulation defect, unspecified: Secondary | ICD-10-CM | POA: Diagnosis not present

## 2016-02-15 DIAGNOSIS — J329 Chronic sinusitis, unspecified: Secondary | ICD-10-CM

## 2016-02-15 DIAGNOSIS — C8510 Unspecified B-cell lymphoma, unspecified site: Secondary | ICD-10-CM | POA: Diagnosis not present

## 2016-02-15 DIAGNOSIS — G620 Drug-induced polyneuropathy: Secondary | ICD-10-CM | POA: Diagnosis present

## 2016-02-15 DIAGNOSIS — D630 Anemia in neoplastic disease: Secondary | ICD-10-CM | POA: Diagnosis not present

## 2016-02-15 DIAGNOSIS — E43 Unspecified severe protein-calorie malnutrition: Secondary | ICD-10-CM

## 2016-02-15 DIAGNOSIS — Z85038 Personal history of other malignant neoplasm of large intestine: Secondary | ICD-10-CM | POA: Diagnosis not present

## 2016-02-15 DIAGNOSIS — R531 Weakness: Secondary | ICD-10-CM | POA: Diagnosis not present

## 2016-02-15 DIAGNOSIS — T451X5A Adverse effect of antineoplastic and immunosuppressive drugs, initial encounter: Secondary | ICD-10-CM | POA: Diagnosis present

## 2016-02-15 DIAGNOSIS — I428 Other cardiomyopathies: Secondary | ICD-10-CM | POA: Diagnosis not present

## 2016-02-15 DIAGNOSIS — I5022 Chronic systolic (congestive) heart failure: Secondary | ICD-10-CM | POA: Diagnosis not present

## 2016-02-15 DIAGNOSIS — R7302 Impaired glucose tolerance (oral): Secondary | ICD-10-CM | POA: Diagnosis present

## 2016-02-15 DIAGNOSIS — Z8571 Personal history of Hodgkin lymphoma: Secondary | ICD-10-CM | POA: Diagnosis not present

## 2016-02-15 DIAGNOSIS — D696 Thrombocytopenia, unspecified: Secondary | ICD-10-CM | POA: Diagnosis not present

## 2016-02-15 DIAGNOSIS — M79609 Pain in unspecified limb: Secondary | ICD-10-CM | POA: Diagnosis not present

## 2016-02-15 DIAGNOSIS — E785 Hyperlipidemia, unspecified: Secondary | ICD-10-CM | POA: Diagnosis present

## 2016-02-15 DIAGNOSIS — J322 Chronic ethmoidal sinusitis: Secondary | ICD-10-CM | POA: Diagnosis not present

## 2016-02-15 DIAGNOSIS — I10 Essential (primary) hypertension: Secondary | ICD-10-CM | POA: Diagnosis not present

## 2016-02-15 DIAGNOSIS — Z9842 Cataract extraction status, left eye: Secondary | ICD-10-CM | POA: Diagnosis not present

## 2016-02-15 DIAGNOSIS — J320 Chronic maxillary sinusitis: Secondary | ICD-10-CM | POA: Diagnosis not present

## 2016-02-15 DIAGNOSIS — I501 Left ventricular failure: Secondary | ICD-10-CM | POA: Diagnosis not present

## 2016-02-15 DIAGNOSIS — B9710 Unspecified enterovirus as the cause of diseases classified elsewhere: Secondary | ICD-10-CM | POA: Diagnosis present

## 2016-02-15 DIAGNOSIS — K921 Melena: Secondary | ICD-10-CM | POA: Diagnosis not present

## 2016-02-15 DIAGNOSIS — R042 Hemoptysis: Secondary | ICD-10-CM | POA: Diagnosis not present

## 2016-02-15 DIAGNOSIS — J328 Other chronic sinusitis: Secondary | ICD-10-CM | POA: Diagnosis not present

## 2016-02-15 DIAGNOSIS — R0600 Dyspnea, unspecified: Secondary | ICD-10-CM

## 2016-02-15 DIAGNOSIS — J323 Chronic sphenoidal sinusitis: Secondary | ICD-10-CM | POA: Diagnosis not present

## 2016-02-15 DIAGNOSIS — J069 Acute upper respiratory infection, unspecified: Secondary | ICD-10-CM | POA: Diagnosis present

## 2016-02-15 DIAGNOSIS — D684 Acquired coagulation factor deficiency: Secondary | ICD-10-CM | POA: Diagnosis not present

## 2016-02-15 DIAGNOSIS — R509 Fever, unspecified: Secondary | ICD-10-CM

## 2016-02-15 DIAGNOSIS — J45909 Unspecified asthma, uncomplicated: Secondary | ICD-10-CM | POA: Diagnosis present

## 2016-02-15 LAB — COMPREHENSIVE METABOLIC PANEL
ALK PHOS: 217 U/L — AB (ref 38–126)
ALT: 16 U/L (ref 14–54)
ANION GAP: 8 (ref 5–15)
AST: 26 U/L (ref 15–41)
Albumin: 2.6 g/dL — ABNORMAL LOW (ref 3.5–5.0)
BILIRUBIN TOTAL: 0.9 mg/dL (ref 0.3–1.2)
BUN: 16 mg/dL (ref 6–20)
CO2: 28 mmol/L (ref 22–32)
Calcium: 8.5 mg/dL — ABNORMAL LOW (ref 8.9–10.3)
Chloride: 103 mmol/L (ref 101–111)
Creatinine, Ser: 0.52 mg/dL (ref 0.44–1.00)
GFR calc non Af Amer: >60 mL/min (ref >60)
Glucose, Bld: 97 mg/dL (ref 65–99)
POTASSIUM: 3.6 mmol/L (ref 3.5–5.1)
Sodium: 139 mmol/L (ref 135–145)
TOTAL PROTEIN: 5.2 g/dL — AB (ref 6.5–8.1)

## 2016-02-15 LAB — OCCULT BLOOD X 1 CARD TO LAB, STOOL: FECAL OCCULT BLD: POSITIVE — AB

## 2016-02-15 LAB — CBC WITH DIFFERENTIAL/PLATELET
BASOS PCT: 0 %
Basophils Absolute: 0 10*3/uL (ref 0.0–0.1)
EOS ABS: 0 10*3/uL (ref 0.0–0.7)
Eosinophils Relative: 0 %
HCT: 24.3 % — ABNORMAL LOW (ref 36.0–46.0)
Hemoglobin: 8.4 g/dL — ABNORMAL LOW (ref 12.0–15.0)
Lymphocytes Relative: 67 %
Lymphs Abs: 4.5 10*3/uL — ABNORMAL HIGH (ref 0.7–4.0)
MCH: 33.2 pg (ref 26.0–34.0)
MCHC: 34.6 g/dL (ref 30.0–36.0)
MCV: 96 fL (ref 78.0–100.0)
Monocytes Absolute: 1.3 10*3/uL — ABNORMAL HIGH (ref 0.1–1.0)
Monocytes Relative: 19 %
NEUTROS ABS: 0.9 10*3/uL — AB (ref 1.7–7.7)
NEUTROS PCT: 14 %
Platelets: 67 10*3/uL — ABNORMAL LOW (ref 150–400)
RBC: 2.53 MIL/uL — ABNORMAL LOW (ref 3.87–5.11)
RDW: 20.3 % — AB (ref 11.5–15.5)
WBC: 6.7 10*3/uL (ref 4.0–10.5)

## 2016-02-15 LAB — RESPIRATORY PANEL BY PCR
ADENOVIRUS-RVPPCR: NOT DETECTED
Bordetella pertussis: NOT DETECTED
CHLAMYDOPHILA PNEUMONIAE-RVPPCR: NOT DETECTED
CORONAVIRUS NL63-RVPPCR: NOT DETECTED
Coronavirus 229E: NOT DETECTED
Coronavirus HKU1: NOT DETECTED
Coronavirus OC43: NOT DETECTED
INFLUENZA A-RVPPCR: NOT DETECTED
Influenza B: NOT DETECTED
METAPNEUMOVIRUS-RVPPCR: NOT DETECTED
Mycoplasma pneumoniae: NOT DETECTED
PARAINFLUENZA VIRUS 4-RVPPCR: NOT DETECTED
Parainfluenza Virus 1: NOT DETECTED
Parainfluenza Virus 2: NOT DETECTED
Parainfluenza Virus 3: NOT DETECTED
RHINOVIRUS / ENTEROVIRUS - RVPPCR: DETECTED — AB
Respiratory Syncytial Virus: NOT DETECTED

## 2016-02-15 LAB — PREPARE PLATELET PHERESIS: Unit division: 0

## 2016-02-15 LAB — APTT: aPTT: 39 seconds — ABNORMAL HIGH (ref 24–36)

## 2016-02-15 LAB — FIBRINOGEN: FIBRINOGEN: 551 mg/dL — AB (ref 210–475)

## 2016-02-15 MED ORDER — IMMUNE GLOBULIN (HUMAN) 20 GM/200ML IV SOLN
1.0000 g/kg | INTRAVENOUS | Status: AC
  Administered 2016-02-15: 100 g via INTRAVENOUS
  Filled 2016-02-15: qty 800

## 2016-02-15 NOTE — Consult Note (Addendum)
Cardiology Consult    Patient ID: Diamond Boyd MRN: [id number], DOB/AGE: November 26, 1940   Admit date: 02/14/2016 Date of Consult: 02/15/2016  Primary Physician: [name], MD Reason for Consult: CHF/ Preoperative risk stratification Primary Cardiologist: Dr. Claiborne Billings  Requesting Provider: Dr. Carles Collet  Patient Profile    Diamond Boyd is a 76 year old female with a past medical history of HTN, chronic systolic CHF, colon cancer, follicular B cell lymphoma in remission  and PAF. She was admitted with two-week history of intermittent epistaxis. Cardiology consulted for CHF/Preoperative risk stratification.     History of Present Illness    Diamond Boyd was admitted for epitaxis in the setting of worsening thrombocytopenia. She follows with Dr. Claiborne Billings and was last seen in Nov. 2017. Last echo was in Feb. 2017 at that time her EF was 45-50%, which was consistent with the prior Echo from December 2016.    Diamond Boyd tells me that she hasn't felt well since she developed a sinus infection a few weeks ago.She has been weighing herself every day or every other day and has not gained more than 2 pounds. She denies SOB, orthopnea and PND. No syncope, no anginal symptoms. No adverse arrhythmias. She has been struggling with epistaxis.     Past Medical History   Past Medical History:  Diagnosis Date  . Anemia    hx blood tx  . Asthma   . Benign essential HTN   . Cancer ([place])    skin cancer- basal cell on arm / colon 2011  . Chronic systolic CHF (congestive heart failure), NYHA class 2 ([place]) 01/2015  . Colon cancer ([place])    2011, s/p surgery, in remission  . Colon polyps   . Degenerative disk disease    Thoracic spine  . Depression   . 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 Generalized weakness 05/11/2015  . GERD (gastroesophageal reflux disease)   . Heart murmur   . hodgkins lymphoma   .  Hypothyroidism   . Lymphoma ([place])   . Neuropathy due to chemotherapeutic drug ([place]) 06/19/2014  . NICM (nonischemic cardiomyopathy) ([place]) 01/2015  . Osteoarthritis    hands  . 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 PAF (paroxysmal atrial fibrillation) ([place])   . Peripheral vascular disease ([place])   . Pneumonia     Past Surgical History:  Procedure Laterality Date  . BONE MARROW BIOPSY  05/27/13  . BREAST BIOPSY  1996  . CARDIAC CATHETERIZATION N/A 01/25/2015   Procedure: Left Heart Cath and Coronary Angiography;  Surgeon: Peter M Martinique, MD;  Location: [facility];  Service: Cardiovascular;  Laterality: N/A;  . CATARACT EXTRACTION, BILATERAL  2016  . CHOLECYSTECTOMY    . COLECTOMY  8/11  . COLONOSCOPY W/ BIOPSIES    . DG BIOPSY LUNG    . LYMPH NODE BIOPSY N/A 11/01/2014   Procedure: MEDIASTINAL LYMPH NODE BIOPSY;  Surgeon: Grace Isaac, MD;  Location: [facility];  Service: Thoracic;  Laterality: N/A;  . MEDIASTINOSCOPY N/A 11/01/2014   Procedure: MEDIASTINOSCOPY;  Surgeon: Grace Isaac, MD;  Location: [facility];  Service: Thoracic;  Laterality: N/A;  . PLANTAR FASCIA RELEASE    . port-a-cath insertion    . SPLENECTOMY     lymphoma  . TENDON RELEASE     Right thumb   . VAGINAL HYSTERECTOMY    . VENTRAL HERNIA REPAIR  1998  .  VIDEO BRONCHOSCOPY WITH ENDOBRONCHIAL ULTRASOUND N/A 10/17/2014   Procedure: VIDEO BRONCHOSCOPY WITH ENDOBRONCHIAL ULTRASOUND;  Surgeon: Javier Glazier, MD;  Location: [facility];  Service: Thoracic;  Laterality: N/A;  . VIDEO BRONCHOSCOPY WITH ENDOBRONCHIAL ULTRASOUND N/A 11/01/2014   Procedure: VIDEO BRONCHOSCOPY WITH ENDOBRONCHIAL ULTRASOUND;  Surgeon: Grace Isaac, MD;  Location: [facility];  Service: Thoracic;  Laterality: N/A;     Allergies  Allergies  Allergen Reactions  . Achromycin [Tetracycline Hcl] Other (See Comments)    Pt does not remember reaction  . Allopurinol Other (See Comments)    REACTION: Unsure of reaction happene years ago  . Astelin  [Azelastine Hcl] Other (See Comments)    Reaction unknown  . Cephalexin Other (See Comments)    REACTION: unsure of reaction happened yrs ago.  . Codeine Other (See Comments)    REACTION: abd. pain  . Lisinopril Cough  . Meloxicam Other (See Comments)    REACTION: GI symptoms  . Minocycline Other (See Comments)    Abdominal pain  . Nabumetone Other (See Comments)    REACTION: reaction not known  . Nyquil [Pseudoeph-Doxylamine-Dm-Apap] Hives  . Sulfa Antibiotics Other (See Comments)    Gi side eff   . Zolpidem Tartrate Other (See Comments)    REACTION: feels too drugged  . Buspar [Buspirone Hcl] Other (See Comments)    Dizziness, and not as effective for anxiety  . Ciprofloxacin Rash    Inpatient Medications    . acidophilus  1 capsule Oral Daily  . acyclovir  400 mg Oral Daily  . calcium-vitamin D  1 tablet Oral QPM  . cholecalciferol  1,000 Units Oral Daily  . estradiol  1 mg Oral Daily  . FLUoxetine  20 mg Oral Daily  . fluticasone furoate-vilanterol  1 puff Inhalation QPM  . furosemide  20 mg Oral Daily  . gabapentin  300 mg Oral BID  . levothyroxine  112 mcg Oral QAC breakfast  . loratadine  10 mg Oral Daily  . metoprolol  12.5 mg Oral BID  . multivitamin with minerals  1 tablet Oral QPM  . pantoprazole  80 mg Oral Daily  . simvastatin  40 mg Oral QHS    Family History    Family History  Problem Relation Age of Onset  . Coronary artery disease Mother   . Diabetes Mother   . Fibromyalgia Daughter     chronic pain   . COPD Daughter   . Diabetes Brother   . Asthma Daughter   . Rheum arthritis Brother   . Clotting disorder Brother   . Alcohol abuse Sister   . Colon cancer Neg Hx   . Colon polyps Neg Hx   . Stomach cancer Neg Hx   . Rectal cancer Neg Hx   . Ulcerative colitis Neg Hx   . Crohn's disease Neg Hx     Social History    Social History   Social History  . Marital status: Married    Spouse name: N/A  . Number of children: 2  . Years of  education: N/A   Occupational History  . Retired  Retired   Social History Main Topics  . Smoking status: Never Smoker  . Smokeless tobacco: Never Used     Comment: Second-hand exposure through father.  . Alcohol use No  . Drug use: No  . Sexual activity: No   Other Topics Concern  . Not on file   Social History Narrative   Originally from [place]. Always lived  in [place]. Previously has traveled to [facility], [place] & up [employer] to [place]. No international travel. No pets currently. Remote parakeet exposure with her children. 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    Lives at home with husband. Weakness due to chemo, but otherwise independant        Review of Systems    General:  No chills, fever, night sweats or weight changes.  Cardiovascular:  No chest pain, dyspnea on exertion, edema, orthopnea, palpitations, paroxysmal nocturnal dyspnea. Dermatological: No rash, lesions/masses Respiratory +cough, dyspnea Urologic: No hematuria, dysuria Abdominal:   No nausea, vomiting, diarrhea, bright red blood per rectum, melena, or hematemesis Neurologic:  No visual changes, wkns, changes in mental status. All other systems reviewed and are otherwise negative except as noted above.  Physical Exam    Blood pressure 101/60, pulse 79, temperature 98.6 F (37 C), temperature source Oral, resp. rate 18, height _0  (1.651 m), weight 219 lb 6.4 oz (99.5 kg), last menstrual period 02/10/1970, SpO2 97 %.  General: Pleasant, NAD Psych: Normal affect. Neuro: Alert and oriented X 3. Moves all extremities spontaneously. HEENT: Left nare with device to help stop bleeding noted Neck: : Supple without bruits or JVD. Lungs:  Resp regular and unlabored, diminished in bilateral lower lobes.  Heart: RRR no s3, s4, or murmurs. Abdomen: Soft, non-tender, non-distended, BS + x 4.  Extremities: No clubbing, cyanosis or edema. DP/PT/Radials 2+ and equal bilaterally.  Labs     Lab Results    Component Value Date   WBC 6.7 02/15/2016   HGB 8.4 (L) 02/15/2016   HCT 24.3 (L) 02/15/2016   MCV 96.0 02/15/2016   PLT 67 (L) 02/15/2016    Recent Labs Lab 02/15/16 0540  NA 139  K 3.6  CL 103  CO2 28  BUN 16  CREATININE 0.52  CALCIUM 8.5*  PROT 5.2*  BILITOT 0.9  ALKPHOS 217*  ALT 16  AST 26  GLUCOSE 97   Lab Results  Component Value Date   CHOL 148 10/24/2015   HDL 14.90 (L) 10/24/2015   LDLCALC 95 01/25/2015   TRIG 310.0 (H) 10/24/2015   Lab Results  Component Value Date   DDIMER 2.76 (H) 04/23/2013     Radiology Studies    Dg Chest 2 View  Result Date: 02/14/2016 CLINICAL DATA:  Shortness of breath, weakness, and chest pain. History of asthma, CHF, EXAM: CHEST  2 VIEW COMPARISON:  Chest x-ray of January 08 2016 FINDINGS: The lungs are mildly hyperinflated with mild hemidiaphragm flattening. There is no focal infiltrate. There is no pleural effusion. The heart and pulmonary vascularity are normal. The power port catheter tip projects over the midportion of the SVC. There is calcification in the wall of the aortic arch. The bony thorax exhibits no acute abnormality. There is mild multilevel degenerative disc disease of the thoracic spine. IMPRESSION: Mild chronic bronchitic change. No pneumonia, CHF, nor other acute cardiopulmonary abnormality. Thoracic aortic atherosclerosis. Electronically Signed   By: David  Martinique M.D.   On: 02/14/2016 17:28   Nm Pet Image Restag (ps) Skull Base To Thigh  Result Date: 01/24/2016 CLINICAL DATA:  Subsequent treatment strategy for restaging of lymphoma. EXAM: NUCLEAR MEDICINE PET SKULL BASE TO THIGH TECHNIQUE: 11.1 mCi F-18 FDG was injected intravenously. Full-ring PET imaging was performed from the skull base to thigh after the radiotracer. CT data was obtained and used for attenuation correction and anatomic localization. FASTING BLOOD GLUCOSE:  Value: 106 mg/dl COMPARISON:  07/10/2015.  abdominopelvic CT of 12/11/2015. FINDINGS:  NECK Re- demonstration of diffuse thyroid hypermetabolism. This measures a S.U.V. max of 27.1 today versus a S.U.V. 32.8 on the prior. Enlargement of the left lobe is similar. Note is made of new left maxillary sinus opacification with peripheral hypermetabolism. No cervical adenopathy. There is also new left ethmoid air cell and maxillary sinus opacification. CHEST No soft tissue hypermetabolism within the chest. No residual left hilar hypermetabolism identified. A right Port-A-Cath which terminates at the mid right atrium. Mild cardiomegaly. Multivessel coronary artery atherosclerosis. 3 mm left upper lobe pulmonary nodule is unchanged on image 16/series 8. No thoracic adenopathy. ABDOMEN/PELVIS No abdominopelvic nodal or parenchymal hypermetabolism identified. Anal hypermetabolism measures a S.U.V. max of 10.0 and is without CT correlate. This is similar. Cholecystectomy. Aortic and branch vessel atherosclerosis. Proximal gastric underdistention. Anterior abdominal wall laxity again identified. Large colonic stool burden. Partial right hemicolectomy. SKELETON Development of diffuse marrow hypermetabolism. This makes delineation and evaluation of focal lesion described previously difficult to impossible. There is a right-sided sternal focus of hypermetabolism which measures a S.U.V. max of 8.1 including on approximately image 53/series 4. This is unchanged. IMPRESSION: 1. No evidence of hypermetabolic soft tissue lymphoma. 2. Redevelopment of diffuse marrow hypermetabolism which could be due to marrow stimulation by chemotherapy. This makes evaluation and delineation of the previously described focal osseous lesions difficult. A right-sided sternal lesion is not significantly changed. 3. Diffuse thyroid hypermetabolism persists and may represent thyroiditis. 4. Cardiomegaly. Coronary artery atherosclerosis. Aortic atherosclerosis. 5.  Possible constipation. 6. New left-sided sinus disease. Electronically Signed    By: Abigail Miyamoto M.D.   On: 01/24/2016 10:09    EKG & Cardiac Imaging    EKG: NSR,LBBB  Echocardiogram: 03/2015 Study Conclusions  - Left ventricle: The cavity size was normal. Wall thickness was   normal. Systolic function was mildly reduced. The estimated   ejection fraction was in the range of 45% to 50%. Mild diffuse   hypokinesis with no identifiable regional variations. Features   are consistent with a pseudonormal left ventricular filling   pattern, with concomitant abnormal relaxation and increased   filling pressure (grade 2 diastolic dysfunction). - Mitral valve: There was mild regurgitation. - Left atrium: The atrium was moderately to severely dilated.   Assessment & Plan    1. History of chronic systolic CHF: Appears euvolemic on exam. Last Echo above. Her home losartan is on hold due to hypotension. BP appears to be soft with SBP's in the 100's. Add back losartan as BP allows. Agree with reducing home metoprolol dose as she was hypotensive.   Will monitor fluid status as she is getting IVIG.  2.Epistaxis: Resolved per primary team.   3. Fever/Cough : Ruling out Flu.   4. Preoperative risk stratification: From a cardiac perspective, she may proceed with surgery if felt necessary with mild to moderate risk. She does not appear to be in heart failure, no adverse arrhythmias detected, no anginal symptoms. Be careful with underlying anemia and hypotension.   Signed, Arbutus Leas, NP 02/15/2016, 1:01 PM Pager: [phone]  Personally seen and examined. Agree with above.  Chronic systolic heart failure  - agree that she does not appear overloaded on exam  - dyspnea may be related to anemia  - add ARB back as BP allows.   - Pulled back on metoprolol secondary to hypotension  - EF only mildly reduced 45-50%  - CXR with no evidence of CHF  Anemia  - 7.5 Hg may be  playing a role in dyspnea symptoms  Fever  - Flu A neg. Await B  LBBB  - fairly chronic.    Preoperative risk stratification: From a cardiac perspective, she may proceed with surgery if felt necessary with mild to moderate risk. She does not appear to be in heart failure, no adverse arrhythmias detected, no anginal symptoms. Be careful with underlying anemia and hypotension.   Will check on her tomorrow.   Candee Furbish, MD

## 2016-02-15 NOTE — Progress Notes (Signed)
PROGRESS NOTE    Diamond Boyd  ZOX:[id number] DOB: [birth year] DOA: 02/14/2016 PCP: [name], MD  Brief Narrative:  Diamond Boyd is a 76 y.o. female with medical history of colon cancer, Hodgkin's lymphoma, follicular B cell lymphoma in remission presents with two-week history of intermittent epistaxis. The patient states that over the last 2-3 days she has had 2 episodes of epistaxis that have lasted for a longer period of time, approximately hours in duration. Her most recent episode of epistaxis began around 5 AM on the day of admission. Because she continued to bleed, she presented to the emergency department for further evaluation. The patient states that she has been digitally manipulating her nares and pulling out the clots which has resulted in further bleeding on occasion. However, she states that there are other times that even without digital manipulation of her nurse, she spontaneously bleeds. The patient denies any dizziness, chest pain but complains of some mild dyspnea on exertion which has been unchanged in the past couple weeks. She states that her weights have been stable in the past 2 weeks. In fact, she states that she is at her lowest weight that she has been for some time. The patient states that her last weight was 221 pounds. She denies worsening leg edema, orthopnea, or increasing abdominal girth. She states that she has had some vomitus with blood, but states that this has only occurred after she has had epistaxis. In addition, she has occasional hemoptysis, but this only occurs in the setting for epistaxis. She denies any abdominal pain, dysuria, hematuria, hematochezia. She has noticed some "dark stools". In addition, the patient has been having some intermittent fevers for the last 48 hours ranging from 101.6F and 103F.  In the emergency department, the patient had a low-grade temperature of 99.6F but was hemodynamically stable saturating 96% on room air. The patient  was given Afrin which helped stop her epistaxis and had left anterior and posterior nasal packing done. BMP showed a sodium 133 otherwise was unremarkable. CBC showed hemoglobin 9.1 with platelets 16,000. Was admitted for Epistaxis and Hematology, ENT, and Cardiology were consulted.  Assessment & Plan:   Active Problems:   Chronic maxillary sinusitis   Thrombocytopenia (HCC)   Benign essential HTN   PAF (paroxysmal atrial fibrillation) (HCC)   Epistaxis   Pancytopenia, acquired ([place])   Acute blood loss anemia   Chronic combined systolic and diastolic CHF (congestive heart failure) (HCC)  Epistaxis with worsening Thrombocytopenia -Patient is s/p Left Anterior and Posterior Nasal Packing done by Dr. Redmond Baseman -The patient follows Dr. Alvy Bimler for pancytopenia. She has been consulted to follow the patient and appreciate Recc's and Further Evaluation -Patient is s/pTransfuse 1 unit of platelets -Dr. Alvy Bimler ordered IVIG during admission -Discussed the importance of avoiding digital manipulation of her nares -PTT 38 -INR 1.13 -ENT Consulted and appreciate evaluation and recc's -Patient's Platelet Count went from 16 -> 67 -Dr. Alvy Bimler ordered Coagulation studies and because they were abnormal has ordered a mixing study -Repeat CBC in AM  Fever with Dyspnea/cough likely from Rhinovirus -Influenza A via PCR negative -Respiratory Viral Panel Positive for Rhinovirus/Enterovirus -CXR showed Mild chronic bronchitic change. No pneumonia, CHF, nor other acute cardiopulmonary abnormality. Thoracic aortic atherosclerosis. -UA was Negative -No documented fever since admission  Chronic Sinusitis -C/w Loratidine 10 mg po Daily -S/p Multiple Courses of Abx Therapy -Dr. Alvy Bimler coordinating with Dr. Redmond Baseman in ENT about possible Endoscopy and Biopsy -Cardiology Clearance appreciated For possible ENT procedure.  Acute blood loss anemia secondary to Epistaxis vs. Cancer -Baseline hemoglobin  10-11 -Presenting hemoglobin 9.1 and now currently 8.4 -Per Dr. Alvy Bimler Recommend transfusion as needed to keep Hb above 8 -Anemia Studies ordered by Oncology including CBC, Ferritin, Iron and TIBC, Reticulocytes, Vitamin Q76  Chronic systolic and diastolic CHF -22/63/3354 echo EF 45-50%, grade 2 DD -She is euvolemic clinically -Daily weights -C/w Furosemide 20 mg po qDaily -Losartan on hold secondary to soft blood pressure -C/w Metoprolol 12.5 mg po BID -Cardiology Dr. Marlou Porch evaluated and appreciate Recc's and Preoperative Clearance  Paroxysmal Atrial Fibrillation -Not a candidate for anticoagulation secondary to her pancytopenia -Presently in sinus rhythm -CHADSVASc = 6 -Continue to Monitor HR on Telemetry   Hypertension -Hold Losartan and Continue Metoprolol Tartrate at a lower dose as discussed above  Hyperlipidemia -C/w Simvastatin 40 mg po Daily  Follicular B-cell lymphoma -Recent PET scan shows the patient has no definitive signs of cancer but there is hypermetabolic activity noted around bone marrow area -Follow up with Dr. Alvy Bimler -Appreciate Dr. Calton Dach evaluation and Recc's  Hematemesis/Hemoptysis? -Likely resulting from blood from her epistaxis and not true hematemesis or hemoptysis as these have occurred only in the setting of her Epistaxis -Patient's Hb/Hct went from 9.1/27.5 -> 8.4/24.3 -FOBT Positive -Will continue to monitor CBC and for S/Sx of Bleeding  Lower Extremity Pain and Edema -Venous duplex done and preliminary findings show no evidence of DVT or Bakers Cyst in the Lower Extremities   Asthma -Stable clinically -Continue Breo Ellipta 1 puff Inhalation and Albuterol 2.5 Neb q6hprn  Neuropathy secondary to chemotherapy -Continue Gabapentin  Hypothyroidism -Continue Synthroid 112 mcg  Impaired Glucose Tolerance -10/24/2015 hemoglobin A1c 6.4 -Will not start sliding scale -Continue to Monitor BS  DVT prophylaxis: SCD's because  of Thrombocytopenia Code Status: FULL CODE Family Communication: Discussed with Husband at bedside Disposition Plan: Remain Inpatient  Consultants:   ENT Dr. Redmond Baseman  Oncology Dr. Alvy Bimler  Cardiology Dr. Candee Furbish   Procedures: Nasal Packing   Antimicrobials: None   Subjective: Seen and examined at bedside and stated Nasal packing had helped and feels like she needed to blow her nose but did not want to try. No CP or SOB. Had No reoccurance of fever since yesterday.   Objective: Vitals:   02/14/16 1955 02/14/16 2135 02/14/16 2200 02/15/16 0531  BP:  120/78 (!) 121/47 (!) 109/51  Pulse:  78 68 82  Resp:  '20 18 16  '$ Temp:  97.8 F (36.6 C) 97.7 F (36.5 C) 97.7 F (36.5 C)  TempSrc:  Oral Oral Oral  SpO2: 94% 99% 98% 97%  Weight:      Height:        Intake/Output Summary (Last 24 hours) at 02/15/16 5625 Last data filed at 02/14/16 2130  Gross per 24 hour  Intake            388.5 ml  Output                1 ml  Net            387.5 ml   Filed Weights   02/14/16 1521  Weight: 99.3 kg (219 lb)    Examination: Physical Exam:  Constitutional: WN/WD, NAD and appears calm and comfortable Eyes: Lids and conjunctivae normal, sclerae anicteric  ENMT: External Ears, Left Nare Packed and Right nare with dry blood. Grossly normal hearing.  Neck: Appears normal, supple, no cervical masses, normal ROM, no appreciable thyromegaly, no JVD Respiratory: Clear to auscultation  bilaterally, no wheezing, rales, rhonchi or crackles. Normal respiratory effort and patient is not tachypenic. No accessory muscle use.  Cardiovascular: RRR, no murmurs / rubs / gallops. S1 and S2 auscultated. 1+ LE extremity edema. Abdomen: Soft, non-tender, non-distended. No masses palpated. No appreciable hepatosplenomegaly. Bowel sounds positive x4.  GU: Deferred. Musculoskeletal: No clubbing / cyanosis of digits/nails. No joint deformity upper and lower extremities. No contractures.  Skin: No rashes,  lesions, ulcers. No induration; Warm and dry.  Neurologic: CN 2-12 grossly intact with no focal deficits. Sensation intact in all 4 Extremities. Romberg sign cerebellar reflexes not assessed.  Psychiatric: Normal judgment and insight. Alert and oriented x 3. Normal mood and appropriate affect.   Data Reviewed: I have personally reviewed following labs and imaging studies  CBC:  Recent Labs Lab 02/14/16 1221 02/15/16 0540  WBC 7.8 6.7  NEUTROABS 1.0* 0.9*  HGB 9.1* 8.4*  HCT 27.5* 24.3*  MCV 96.2 96.0  PLT 16* 67*   Basic Metabolic Panel:  Recent Labs Lab 02/14/16 1221 02/15/16 0540  NA 133* 139  K 4.4 3.6  CL 100* 103  CO2 27 28  GLUCOSE 122* 97  BUN 19 16  CREATININE 0.67 0.52  CALCIUM 8.9 8.5*   GFR: Estimated Creatinine Clearance: 69.8 mL/min (by C-G formula based on SCr of 0.52 mg/dL). Liver Function Tests:  Recent Labs Lab 02/15/16 0540  AST 26  ALT 16  ALKPHOS 217*  BILITOT 0.9  PROT 5.2*  ALBUMIN 2.6*   No results for input(s): LIPASE, AMYLASE in the last 168 hours. No results for input(s): AMMONIA in the last 168 hours. Coagulation Profile:  Recent Labs Lab 02/14/16 1221  INR 1.13   Cardiac Enzymes: No results for input(s): CKTOTAL, CKMB, CKMBINDEX, TROPONINI in the last 168 hours. BNP (last 3 results) No results for input(s): PROBNP in the last 8760 hours. HbA1C: No results for input(s): HGBA1C in the last 72 hours. CBG: No results for input(s): GLUCAP in the last 168 hours. Lipid Profile: No results for input(s): CHOL, HDL, LDLCALC, TRIG, CHOLHDL, LDLDIRECT in the last 72 hours. Thyroid Function Tests: No results for input(s): TSH, T4TOTAL, FREET4, T3FREE, THYROIDAB in the last 72 hours. Anemia Panel: No results for input(s): VITAMINB12, FOLATE, FERRITIN, TIBC, IRON, RETICCTPCT in the last 72 hours. Sepsis Labs: No results for input(s): PROCALCITON, LATICACIDVEN in the last 168 hours.  No results found for this or any previous visit  (from the past 240 hour(s)).   Radiology Studies: Dg Chest 2 View  Result Date: 02/14/2016 CLINICAL DATA:  Shortness of breath, weakness, and chest pain. History of asthma, CHF, EXAM: CHEST  2 VIEW COMPARISON:  Chest x-ray of January 08 2016 FINDINGS: The lungs are mildly hyperinflated with mild hemidiaphragm flattening. There is no focal infiltrate. There is no pleural effusion. The heart and pulmonary vascularity are normal. The power port catheter tip projects over the midportion of the SVC. There is calcification in the wall of the aortic arch. The bony thorax exhibits no acute abnormality. There is mild multilevel degenerative disc disease of the thoracic spine. IMPRESSION: Mild chronic bronchitic change. No pneumonia, CHF, nor other acute cardiopulmonary abnormality. Thoracic aortic atherosclerosis. Electronically Signed   By: David  Martinique M.D.   On: 02/14/2016 17:28   Scheduled Meds: . acidophilus  1 capsule Oral Daily  . acyclovir  400 mg Oral Daily  . calcium-vitamin D  1 tablet Oral QPM  . cholecalciferol  1,000 Units Oral Daily  . estradiol  1 mg  Oral Daily  . FLUoxetine  20 mg Oral Daily  . fluticasone furoate-vilanterol  1 puff Inhalation QPM  . furosemide  20 mg Oral Daily  . gabapentin  300 mg Oral BID  . levothyroxine  112 mcg Oral QAC breakfast  . loratadine  10 mg Oral Daily  . metoprolol  12.5 mg Oral BID  . multivitamin with minerals  1 tablet Oral QPM  . pantoprazole  80 mg Oral Daily  . simvastatin  40 mg Oral QHS   Continuous Infusions:   LOS: 0 days   Kerney Elbe, DO [facility] Pager [phone]  If 7PM-7AM, please contact night-coverage [email or web address] Password [email or web address] 02/15/2016, 7:42 AM

## 2016-02-15 NOTE — Progress Notes (Addendum)
Nursing Note: Pt called for med for headache.Once at the bedside,pt c/o feeling "cold",but the room was hot.A: Checked pt's temperature and was 102.8,P-87 resp-32 PO2 100% on r/a.Pt denied SOB.Pt's husband at the bedside and says pt has been running high temps for awhile now .since she had a "sinus infection"A:Paged on call and made aware of vitals and that pt has nasal packing in place for nose bleed.R: Blood cultures x2 ordered and tylenol givenPaged the lab and made them aware that pt needed blood cultures drawn..wbb

## 2016-02-15 NOTE — Progress Notes (Signed)
*  PRELIMINARY RESULTS* Vascular Ultrasound Bilateral lower extremity venous duplex has been completed.  Preliminary findings: No evidence of deep vein thrombosis or baker's cysts in the lower extremities.   Myrtie Cruise Navah Grondin 02/15/2016, 9:20 AM

## 2016-02-15 NOTE — Consult Note (Signed)
Diamond Boyd NOTE  Patient Care Team: Abner Greenspan, MD as PCP - General Mosetta Anis, MD (Allergy) Fanny Skates, MD as Consulting Physician (General Surgery) Grace Isaac, MD as Consulting Physician (Cardiothoracic Surgery) Troy Sine, MD as Consulting Physician (Cardiology) Lonn Georgia, PA-C as Physician Assistant (Cardiology) [name], MD as Consulting Physician (Hematology and Oncology) Estill Cotta, MD as Consulting Physician (Ophthalmology)  CHIEF COMPLAINTS/PURPOSE OF CONSULTATION:  Severe pancytopenia, recurrent nosebleed and other medical issues  HISTORY OF PRESENTING ILLNESS:  Diamond Boyd 76 y.o. female is admitted to the hospital due to severe, recurrent nosebleed She has intermittent bleeding, predominantly from the left nasal passage, on and off over the past 2 weeks. Prior to admission yesterday, she had nonstop bleeding for almost 2 hours. She presented to the emergency department and received nasal packing. She received 1 unit of platelet transfusion and denies further bleeding since then. This patient has complex oncology history as follows: Oncology History   History of colon cancer   Staging form: Colon and Rectum, AJCC 7th Edition     Clinical: Stage IIA (T3, N0, M0) - Signed by [name], MD on 02/21/2014 History of hodgkin's lymphoma   Staging form: Lymphoid Neoplasms, AJCC 6th Edition     Clinical: Stage IV - Signed by [name], MD on 02/21/2014       History of Hodgkin's lymphoma   05/20/2013 Initial Diagnosis    Hodgkin lymphoma      06/27/2014 Imaging    Interval increase in metabolic activity of several small lymph nodes is concerning for lymphoma recurrence. Lymph nodes include a small right level 2 lymph node, right hilar lymph node, and right paratracheal lymph node      07/07/2014 Pathology Results     limited tissue from ultrasound-guided biopsy was nondiagnostic      07/07/2014 Procedure      ultrasound-guided biopsy was nondiagnostic      09/27/2014 Imaging    Repeat PET CT scan show diffuse disease concern for relapse.      10/17/2014 Pathology Results    Accession: [id number] Biopsy was nondiagnostic      10/17/2014 Procedure    She underwent bronchoscopy & EBUS & biopsy of  LN at Level 10L      11/01/2014 Surgery    She underwent cronchoscopy with endobronchial ultrasound, mediastinoscopy and biopsy       11/01/2014 Pathology Results    Accession: [id number] biopsy showed no evidence of lymphoma, only granulomas.      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 ([place])      12/13/2014 Imaging    PET scan showed diffuse disease      12/28/2014 - 12/30/2014 Hospital Admission    She was admitted to the hospital for cycle one of R-ICE  01/18/2015 - 01/20/2015 Chemotherapy    She was admitted to the hospital for cycle 2 of R-ICE      01/20/2015 - 01/22/2015 Hospital Admission    She was then admitted to [facility] regional with respiratory failure/fluid overload and was noted to have mild cardiomyopathy      01/23/2015 - 01/27/2015 Hospital Admission    she was admitted to the hospital with new onset atrial fibrillation with rapid ventricular response and was found to have evidence of myocardial ischemia. She was discharged on medical management and oral anticoagulation therapy      01/25/2015 Procedure    Left  heart cath showed non-obstructive lesions: Prox RCA lesion, 40% stenosed.   Prox LAD to Mid LAD lesion, 30% stenosed. Ost LM lesion, 25% stenosed      01/30/2015 Imaging    PET Ct scan showed near complete resolution of all disease      03/12/2015 - 09/10/2015 Chemotherapy    She received 3 doses of Rituximab every other month      07/10/2015 PET scan    PET showed Improved metabolic activity in the bony lesions with mild persistent hilar LN with increased SUV uptake      09/06/2015 Imaging    Ct scan showed interval development of mild mucosal thickening involving left maxillary and bilateral ethmoid sinuses consistent with mild sinusitis.      12/10/2015 - 12/16/2015 Hospital Admission    She was admitted to the hospital due to diarrhea and colitis and was found to have Clostridium difficile      01/06/2016 - 01/13/2016 Hospital Admission    She had recurrent hospitalization for weakness, pancytopenia and persistent C Difficile infection      01/24/2016 PET scan    No evidence of hypermetabolic soft tissue lymphoma. 2. Redevelopment of diffuse marrow hypermetabolism which could be due to marrow stimulation by chemotherapy. This makes evaluation and delineation of the previously described focal osseous lesions difficult. A right-sided sternal lesion is not significantly changed. 3. Diffuse thyroid hypermetabolism persists and may represent thyroiditis. 4. Cardiomegaly. Coronary artery atherosclerosis. Aortic atherosclerosis. 5. Possible constipation. 6. New left-sided sinus disease.      She denies recent diarrhea. She had recent hematuria. She denies fevers or chills but has chronic nasal drainage from sinusitis The patient is supposed to be seen in the clinic today for IVIG treatment and due to admission to the hospital, IVIG is prescribed while she is here She denies any chest pain or recent shortness of breath She complained of generalized weakness  MEDICAL HISTORY:  Past  Medical History:  Diagnosis Date  . Anemia    hx blood tx  . Asthma   . Benign essential HTN   . Cancer ([place])    skin cancer- basal cell on arm / colon 2011  . Chronic systolic CHF (congestive heart failure), NYHA class 2 ([place]) 01/2015  . Colon cancer ([place])    2011, s/p surgery, in remission  . Colon polyps   . Degenerative disk disease    Thoracic spine  . Depression   . 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 Generalized weakness 05/11/2015  . GERD (gastroesophageal reflux disease)   . Heart murmur   . hodgkins lymphoma   . Hypothyroidism   . Lymphoma ([place])   . Neuropathy due to chemotherapeutic drug ([place]) 06/19/2014  . NICM (nonischemic cardiomyopathy) ([place]) 01/2015  . Osteoarthritis  hands  . 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 PAF (paroxysmal atrial fibrillation) ([place])   . Peripheral vascular disease ([place])   . Pneumonia     SURGICAL HISTORY: Past Surgical History:  Procedure Laterality Date  . BONE MARROW BIOPSY  05/27/13  . BREAST BIOPSY  1996  . CARDIAC CATHETERIZATION N/A 01/25/2015   Procedure: Left Heart Cath and Coronary Angiography;  Surgeon: Peter M Martinique, MD;  Location: [place] [facility];  Service: Cardiovascular;  Laterality: N/A;  . CATARACT EXTRACTION, BILATERAL  2016  . CHOLECYSTECTOMY    . COLECTOMY  8/11  . COLONOSCOPY W/ BIOPSIES    . DG BIOPSY LUNG    . LYMPH NODE BIOPSY N/A 11/01/2014   Procedure: MEDIASTINAL LYMPH NODE BIOPSY;  Surgeon: Grace Isaac, MD;  Location: [place];  Service: Thoracic;  Laterality: N/A;  . MEDIASTINOSCOPY N/A 11/01/2014   Procedure: MEDIASTINOSCOPY;  Surgeon: Grace Isaac, MD;  Location: [place];  Service: Thoracic;  Laterality: N/A;  . PLANTAR FASCIA RELEASE    . port-a-cath insertion    . SPLENECTOMY     lymphoma  . TENDON RELEASE     Right thumb   . VAGINAL HYSTERECTOMY    . VENTRAL HERNIA REPAIR  1998  . VIDEO  BRONCHOSCOPY WITH ENDOBRONCHIAL ULTRASOUND N/A 10/17/2014   Procedure: VIDEO BRONCHOSCOPY WITH ENDOBRONCHIAL ULTRASOUND;  Surgeon: Javier Glazier, MD;  Location: [place];  Service: Thoracic;  Laterality: N/A;  . VIDEO BRONCHOSCOPY WITH ENDOBRONCHIAL ULTRASOUND N/A 11/01/2014   Procedure: VIDEO BRONCHOSCOPY WITH ENDOBRONCHIAL ULTRASOUND;  Surgeon: Grace Isaac, MD;  Location: [place];  Service: Thoracic;  Laterality: N/A;    SOCIAL HISTORY: Social History   Social History  . Marital status: Married    Spouse name: N/A  . Number of children: 2  . Years of education: N/A   Occupational History  . Retired  Retired   Social History Main Topics  . Smoking status: Never Smoker  . Smokeless tobacco: Never Used     Comment: Second-hand exposure through father.  . Alcohol use No  . Drug use: No  . Sexual activity: No   Other Topics Concern  . Not on file   Social History Narrative   Originally from [place]. Always lived in [place]. Previously has traveled to [place] [facility], [place] & up [employer] to [place]. No international travel. No pets currently. Remote parakeet exposure with her children. 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    Lives at home with husband. Weakness due to chemo, but otherwise independant       FAMILY HISTORY: Family History  Problem Relation Age of Onset  . Coronary artery disease Mother   . Diabetes Mother   . Fibromyalgia Daughter     chronic pain   . COPD Daughter   . Diabetes Brother   . Asthma Daughter   . Rheum arthritis Brother   . Clotting disorder Brother   . Alcohol abuse Sister   . Colon cancer Neg Hx   . Colon polyps Neg Hx   . Stomach cancer Neg Hx   . Rectal cancer Neg Hx   . Ulcerative colitis Neg Hx   . Crohn's disease Neg Hx     ALLERGIES:  is allergic to achromycin [tetracycline hcl]; allopurinol; astelin [azelastine hcl]; cephalexin; codeine; lisinopril; meloxicam; minocycline; nabumetone; nyquil [pseudoeph-doxylamine-dm-apap];  sulfa antibiotics; zolpidem tartrate; buspar [buspirone hcl]; and ciprofloxacin.  MEDICATIONS:  Current Facility-Administered Medications  Medication Dose Route Frequency Provider Last Rate Last  Dose  . acetaminophen (TYLENOL) tablet 650 mg  650 mg Oral Q6H PRN Orson Eva, MD   650 mg at 02/15/16 1009   Or  . acetaminophen (TYLENOL) suppository 650 mg  650 mg Rectal Q6H PRN Orson Eva, MD      . acidophilus (RISAQUAD) capsule 1 capsule  1 capsule Oral Daily Orson Eva, MD   1 capsule at 02/15/16 0950  . acyclovir (ZOVIRAX) tablet 400 mg  400 mg Oral Daily Orson Eva, MD   400 mg at 02/15/16 0950  . albuterol (PROVENTIL) (2.5 MG/3ML) 0.083% nebulizer solution 2.5 mg  2.5 mg Nebulization Q6H PRN Orson Eva, MD      . calcium-vitamin D (OSCAL WITH D) 500-200 MG-UNIT per tablet 1 tablet  1 tablet Oral QPM Orson Eva, MD      . cholecalciferol (VITAMIN D) tablet 1,000 Units  1,000 Units Oral Daily Orson Eva, MD   1,000 Units at 02/15/16 0950  . estradiol (ESTRACE) tablet 1 mg  1 mg Oral Daily Orson Eva, MD   1 mg at 02/15/16 1009  . FLUoxetine (PROZAC) capsule 20 mg  20 mg Oral Daily Orson Eva, MD   20 mg at 02/15/16 0950  . fluticasone furoate-vilanterol (BREO ELLIPTA) 200-25 MCG/INH 1 puff  1 puff Inhalation QPM Orson Eva, MD   1 puff at 02/14/16 1955  . furosemide (LASIX) tablet 20 mg  20 mg Oral Daily Orson Eva, MD   20 mg at 02/15/16 0950  . gabapentin (NEURONTIN) capsule 300 mg  300 mg Oral BID Orson Eva, MD   300 mg at 02/15/16 0949  . levothyroxine (SYNTHROID, LEVOTHROID) tablet 112 mcg  112 mcg Oral QAC breakfast Orson Eva, MD   112 mcg at 02/15/16 0950  . loratadine (CLARITIN) tablet 10 mg  10 mg Oral Daily Orson Eva, MD   10 mg at 02/15/16 0951  . metoprolol tartrate (LOPRESSOR) tablet 12.5 mg  12.5 mg Oral BID Orson Eva, MD   25 mg at 02/15/16 0950  . multivitamin with minerals tablet 1 tablet  1 tablet Oral QPM Orson Eva, MD      . ondansetron [facility]) tablet 4 mg  4 mg Oral Q6H PRN Orson Eva, MD       Or  . ondansetron (ZOFRAN) injection 4 mg  4 mg Intravenous Q6H PRN Orson Eva, MD      . pantoprazole (PROTONIX) EC tablet 80 mg  80 mg Oral Daily Orson Eva, MD   80 mg at 02/15/16 0951  . simvastatin (ZOCOR) tablet 40 mg  40 mg Oral QHS Orson Eva, MD   40 mg at 02/14/16 2154   Facility-Administered Medications Ordered in Other Encounters  Medication Dose Route Frequency Provider Last Rate Last Dose  . sodium chloride 0.9 % injection 10 mL  10 mL Intravenous PRN [name], MD   10 mL at 12/20/15 1436    REVIEW OF SYSTEMS:   Constitutional: Denies fevers, chills or abnormal night sweats Eyes: Denies blurriness of vision, double vision or watery eyes Ears, nose, mouth, throat, and face: Denies mucositis or sore throat Respiratory: Denies cough, dyspnea or wheezes Cardiovascular: Denies palpitation, chest discomfort or lower extremity swelling Gastrointestinal:  Denies nausea, heartburn or change in bowel habits Skin: Denies abnormal skin rashes Lymphatics: Denies new lymphadenopathy or easy bruising Neurological:Denies numbness, tingling o Behavioral/Psych: Mood is stable, no new changes  All other systems were reviewed with the patient and are negative.  PHYSICAL EXAMINATION: ECOG PERFORMANCE STATUS: 3 -  Symptomatic, >50% confined to bed  Vitals:   02/15/16 1216 02/15/16 1256  BP: 101/66 101/60  Pulse: 86 79  Resp: 18 18  Temp: 98.7 F (37.1 C) 98.6 F (37 C)   Filed Weights   02/14/16 1521 02/15/16 0900  Weight: 219 lb (99.3 kg) 219 lb 6.4 oz (99.5 kg)    GENERAL:alert, no distress and comfortable. She is moderately obese SKIN: skin color is pale, texture, turgor are normal, no rashes or significant lesions EYES: normal, conjunctiva are pale and non-injected, sclera clear OROPHARYNX:no exudate, no erythema and lips, buccal mucosa, and tongue normal . Her nose is packed NECK: supple, thyroid normal size, non-tender, without nodularity LYMPH:  no palpable  lymphadenopathy in the cervical, axillary or inguinal LUNGS: clear to auscultation and percussion with normal breathing effort HEART: regular rate & rhythm and no murmurs and no lower extremity edema ABDOMEN:abdomen soft, non-tender and normal bowel sounds Musculoskeletal:no cyanosis of digits and no clubbing  PSYCH: alert & oriented x 3 with fluent speech NEURO: no focal motor/sensory deficits  LABORATORY DATA:  I have reviewed the data as listed Lab Results  Component Value Date   WBC 6.7 02/15/2016   HGB 8.4 (L) 02/15/2016   HCT 24.3 (L) 02/15/2016   MCV 96.0 02/15/2016   PLT 67 (L) 02/15/2016    Recent Labs  12/12/15 0345  01/13/16 0456 01/17/16 0834 01/24/16 0931 02/14/16 1221 02/15/16 0540  NA 135  < > 139 139 139 133* 139  K 3.7  < > 3.8 3.6 3.6 4.4 3.6  CL 104  < > 104  --   --  100* 103  CO2 23  < > 30 23 23 27 28   GLUCOSE 86  < > 101* 105 108 122* 97  BUN 15  < > 11 9.5 10.4 19 16   CREATININE 0.82  < > 0.70 0.8 0.8 0.67 0.52  CALCIUM 8.5*  < > 8.5* 9.1 9.5 8.9 8.5*  GFRNONAA >60  < > >60  --   --  >60 >60  GFRAA >60  < > >60  --   --  >60 >60  PROT 5.2*  < >  --  6.0* 7.4  --  5.2*  ALBUMIN 2.6*  < >  --  2.8* 2.9*  --  2.6*  AST 68*  < >  --  53* 28  --  26  ALT 28  < >  --  36 19  --  16  ALKPHOS 205*  < >  --  381* 394*  --  217*  BILITOT 1.2  < >  --  0.79 0.89  --  0.9  BILIDIR 0.3  --   --   --   --   --   --   IBILI 0.9  --   --   --   --   --   --   < > = values in this interval not displayed.  RADIOGRAPHIC STUDIES: I have personally reviewed the radiological images as listed and agreed with the findings in the report. Dg Chest 2 View  Result Date: 02/14/2016 CLINICAL DATA:  Shortness of breath, weakness, and chest pain. History of asthma, CHF, EXAM: CHEST  2 VIEW COMPARISON:  Chest x-ray of January 08 2016 FINDINGS: The lungs are mildly hyperinflated with mild hemidiaphragm flattening. There is no focal infiltrate. There is no pleural effusion.  The heart and pulmonary vascularity are normal. The power port catheter tip projects over the midportion  of the SVC. There is calcification in the wall of the aortic arch. The bony thorax exhibits no acute abnormality. There is mild multilevel degenerative disc disease of the thoracic spine. IMPRESSION: Mild chronic bronchitic change. No pneumonia, CHF, nor other acute cardiopulmonary abnormality. Thoracic aortic atherosclerosis. Electronically Signed   By: David  Martinique M.D.   On: 02/14/2016 17:28   Nm Pet Image Restag (ps) Skull Base To Thigh  Result Date: 01/24/2016 CLINICAL DATA:  Subsequent treatment strategy for restaging of lymphoma. EXAM: NUCLEAR MEDICINE PET SKULL BASE TO THIGH TECHNIQUE: 11.1 mCi F-18 FDG was injected intravenously. Full-ring PET imaging was performed from the skull base to thigh after the radiotracer. CT data was obtained and used for attenuation correction and anatomic localization. FASTING BLOOD GLUCOSE:  Value: 106 mg/dl COMPARISON:  07/10/2015.  abdominopelvic CT of 12/11/2015. FINDINGS: NECK Re- demonstration of diffuse thyroid hypermetabolism. This measures a S.U.V. max of 27.1 today versus a S.U.V. 32.8 on the prior. Enlargement of the left lobe is similar. Note is made of new left maxillary sinus opacification with peripheral hypermetabolism. No cervical adenopathy. There is also new left ethmoid air cell and maxillary sinus opacification. CHEST No soft tissue hypermetabolism within the chest. No residual left hilar hypermetabolism identified. A right Port-A-Cath which terminates at the mid right atrium. Mild cardiomegaly. Multivessel coronary artery atherosclerosis. 3 mm left upper lobe pulmonary nodule is unchanged on image 16/series 8. No thoracic adenopathy. ABDOMEN/PELVIS No abdominopelvic nodal or parenchymal hypermetabolism identified. Anal hypermetabolism measures a S.U.V. max of 10.0 and is without CT correlate. This is similar. Cholecystectomy. Aortic and branch  vessel atherosclerosis. Proximal gastric underdistention. Anterior abdominal wall laxity again identified. Large colonic stool burden. Partial right hemicolectomy. SKELETON Development of diffuse marrow hypermetabolism. This makes delineation and evaluation of focal lesion described previously difficult to impossible. There is a right-sided sternal focus of hypermetabolism which measures a S.U.V. max of 8.1 including on approximately image 53/series 4. This is unchanged. IMPRESSION: 1. No evidence of hypermetabolic soft tissue lymphoma. 2. Redevelopment of diffuse marrow hypermetabolism which could be due to marrow stimulation by chemotherapy. This makes evaluation and delineation of the previously described focal osseous lesions difficult. A right-sided sternal lesion is not significantly changed. 3. Diffuse thyroid hypermetabolism persists and may represent thyroiditis. 4. Cardiomegaly. Coronary artery atherosclerosis. Aortic atherosclerosis. 5.  Possible constipation. 6. New left-sided sinus disease. Electronically Signed   By: Abigail Miyamoto M.D.   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 Recommend transfusion as needed to keep hemoglobin above 8 g Will order anemia studies  Recurrent nosebleed Mild coagulopathy She had received platelet transfusion this morning Coagulation study came back abnormal I will order mixing study  Chronic recurrent sinusitis This has been a major issue as a cause recurrent severe infection to the point that after multiple courses of antibiotic therapy, she developed recent Clostridium difficile infection I'm concerned about atypical infection such as fungal infection affecting her sinus I  have spoken with the ENT many times to consider repeat evaluation with endoscopy and biopsy I spoke with Dr. Redmond Baseman again today He would like cardiology clearance before he proceed with surgical option  Congestive heart failure Recommend cardiology consult for cardiac clearance  Severe protein calorie malnutrition Recommend dietitian  consult  DVT prophylaxis I do not recommend Lovenox due to recent bleeding and coagulopathy  Discharge planning She is too ill to be discharged If we can obtain cardiology clearance this weekend, we may transfer her to [facility] so that ENT service can perform sinus surgery    All questions were answered. The patient knows to call the clinic with any problems, questions or concerns.    [name], MD 02/15/2016 1:26 PM

## 2016-02-16 DIAGNOSIS — I480 Paroxysmal atrial fibrillation: Secondary | ICD-10-CM

## 2016-02-16 DIAGNOSIS — I501 Left ventricular failure: Secondary | ICD-10-CM

## 2016-02-16 DIAGNOSIS — K921 Melena: Secondary | ICD-10-CM

## 2016-02-16 LAB — COMPREHENSIVE METABOLIC PANEL
ALK PHOS: 193 U/L — AB (ref 38–126)
ALT: 15 U/L (ref 14–54)
ANION GAP: 6 (ref 5–15)
AST: 28 U/L (ref 15–41)
Albumin: 2.3 g/dL — ABNORMAL LOW (ref 3.5–5.0)
BILIRUBIN TOTAL: 0.7 mg/dL (ref 0.3–1.2)
BUN: 15 mg/dL (ref 6–20)
CO2: 27 mmol/L (ref 22–32)
Calcium: 8.2 mg/dL — ABNORMAL LOW (ref 8.9–10.3)
Chloride: 101 mmol/L (ref 101–111)
Creatinine, Ser: 0.64 mg/dL (ref 0.44–1.00)
GFR calc non Af Amer: >60 mL/min (ref >60)
Glucose, Bld: 102 mg/dL — ABNORMAL HIGH (ref 65–99)
Potassium: 3.6 mmol/L (ref 3.5–5.1)
SODIUM: 134 mmol/L — AB (ref 135–145)
TOTAL PROTEIN: 6.6 g/dL (ref 6.5–8.1)

## 2016-02-16 LAB — CBC WITH DIFFERENTIAL/PLATELET
BASOS ABS: 0 10*3/uL (ref 0.0–0.1)
Basophils Relative: 0 %
Eosinophils Absolute: 0 10*3/uL (ref 0.0–0.7)
Eosinophils Relative: 0 %
HEMATOCRIT: 22.5 % — AB (ref 36.0–46.0)
Hemoglobin: 7.8 g/dL — ABNORMAL LOW (ref 12.0–15.0)
LYMPHS ABS: 4.9 10*3/uL — AB (ref 0.7–4.0)
LYMPHS PCT: 66 %
MCH: 33.2 pg (ref 26.0–34.0)
MCHC: 34.7 g/dL (ref 30.0–36.0)
MCV: 95.7 fL (ref 78.0–100.0)
MONOS PCT: 22 %
Monocytes Absolute: 1.6 10*3/uL — ABNORMAL HIGH (ref 0.1–1.0)
NEUTROS ABS: 0.9 10*3/uL — AB (ref 1.7–7.7)
Neutrophils Relative %: 12 %
Platelets: 31 10*3/uL — ABNORMAL LOW (ref 150–400)
RBC: 2.35 MIL/uL — ABNORMAL LOW (ref 3.87–5.11)
RDW: 20.3 % — ABNORMAL HIGH (ref 11.5–15.5)
WBC: 7.4 10*3/uL (ref 4.0–10.5)

## 2016-02-16 LAB — IRON AND TIBC
IRON: 28 ug/dL (ref 28–170)
Saturation Ratios: 11 % (ref 10.4–31.8)
TIBC: 256 ug/dL (ref 250–450)
UIBC: 228 ug/dL

## 2016-02-16 LAB — PREPARE RBC (CROSSMATCH)

## 2016-02-16 LAB — PTT FACTOR INHIBITOR (MIXING STUDY)
APTT: 27.3 s (ref 22.9–30.2)
aPTT 1:1 Mix Saline: 42.1 s
aPTT 1:1 Normal Plasma: 25.4 s (ref 22.9–30.2)

## 2016-02-16 LAB — RETICULOCYTES
RBC.: 2.35 MIL/uL — AB (ref 3.87–5.11)
RETIC CT PCT: 4.5 % — AB (ref 0.4–3.1)
Retic Count, Absolute: 105.8 10*3/uL (ref 19.0–186.0)

## 2016-02-16 LAB — URINE CULTURE

## 2016-02-16 LAB — PHOSPHORUS: PHOSPHORUS: 4.1 mg/dL (ref 2.5–4.6)

## 2016-02-16 LAB — FERRITIN: Ferritin: 1030 ng/mL — ABNORMAL HIGH (ref 11–307)

## 2016-02-16 LAB — VITAMIN B12: Vitamin B-12: 613 pg/mL (ref 180–914)

## 2016-02-16 LAB — MAGNESIUM: Magnesium: 2.2 mg/dL (ref 1.7–2.4)

## 2016-02-16 MED ORDER — TRAMADOL HCL 50 MG PO TABS
50.0000 mg | ORAL_TABLET | Freq: Once | ORAL | Status: AC
  Administered 2016-02-16: 50 mg via ORAL
  Filled 2016-02-16: qty 1

## 2016-02-16 MED ORDER — SODIUM CHLORIDE 0.9 % IV SOLN
Freq: Once | INTRAVENOUS | Status: AC
  Administered 2016-02-16: 13:00:00 via INTRAVENOUS

## 2016-02-16 MED ORDER — PANTOPRAZOLE SODIUM 40 MG IV SOLR
40.0000 mg | Freq: Two times a day (BID) | INTRAVENOUS | Status: DC
  Administered 2016-02-16 – 2016-02-20 (×10): 40 mg via INTRAVENOUS
  Filled 2016-02-16 (×10): qty 40

## 2016-02-16 NOTE — Progress Notes (Signed)
Subjective: Breathing is OK  No CP   Objective: Vitals:   02/16/16 0018 02/16/16 0430 02/16/16 0431 02/16/16 0930  BP: 124/62 118/67  (!) 110/56  Pulse:  95  (!) 112  Resp: 18 18    Temp:  97.8 F (36.6 C)    TempSrc:  Oral    SpO2:  97%    Weight:   217 lb (98.4 kg)   Height:       Weight change: 6.4 oz (0.181 kg)  Intake/Output Summary (Last 24 hours) at 02/16/16 1013 Last data filed at 02/16/16 0934  Gross per 24 hour  Intake              360 ml  Output              600 ml  Net             -240 ml   I/O  ? Complete  +147 cc  General: Alert, awake, oriented x3, in no acute distress Neck:  JVP is normal Heart: Regular rate and rhythm  Gr II/VI systolic murmur at base  No  rubs, gallops.  Lungs: Clear to auscultation.  No rales or wheezes. Exemities:  No edema.   Neuro: Grossly intact, nonfocal.   Lab Results: Results for orders placed or performed during the hospital encounter of 02/14/16 (from the past 24 hour(s))  CBC with Differential/Platelet     Status: Abnormal   Collection Time: 02/16/16  6:30 AM  Result Value Ref Range   WBC 7.4 4.0 - 10.5 K/uL   RBC 2.35 (L) 3.87 - 5.11 MIL/uL   Hemoglobin 7.8 (L) 12.0 - 15.0 g/dL   HCT 22.5 (L) 36.0 - 46.0 %   MCV 95.7 78.0 - 100.0 fL   MCH 33.2 26.0 - 34.0 pg   MCHC 34.7 30.0 - 36.0 g/dL   RDW 20.3 (H) 11.5 - 15.5 %   Platelets 31 (L) 150 - 400 K/uL   Neutrophils Relative % 12 %   Neutro Abs 0.9 (L) 1.7 - 7.7 K/uL   Lymphocytes Relative 66 %   Lymphs Abs 4.9 (H) 0.7 - 4.0 K/uL   Monocytes Relative 22 %   Monocytes Absolute 1.6 (H) 0.1 - 1.0 K/uL   Eosinophils Relative 0 %   Eosinophils Absolute 0.0 0.0 - 0.7 K/uL   Basophils Relative 0 %   Basophils Absolute 0.0 0.0 - 0.1 K/uL  Reticulocytes     Status: Abnormal   Collection Time: 02/16/16  6:30 AM  Result Value Ref Range   Retic Ct Pct 4.5 (H) 0.4 - 3.1 %   RBC. 2.35 (L) 3.87 - 5.11 MIL/uL   Retic Count, Manual 105.8 19.0 - 186.0 K/uL  Comprehensive  metabolic panel     Status: Abnormal   Collection Time: 02/16/16  6:30 AM  Result Value Ref Range   Sodium 134 (L) 135 - 145 mmol/L   Potassium 3.6 3.5 - 5.1 mmol/L   Chloride 101 101 - 111 mmol/L   CO2 27 22 - 32 mmol/L   Glucose, Bld 102 (H) 65 - 99 mg/dL   BUN 15 6 - 20 mg/dL   Creatinine, Ser 0.64 0.44 - 1.00 mg/dL   Calcium 8.2 (L) 8.9 - 10.3 mg/dL   Total Protein 6.6 6.5 - 8.1 g/dL   Albumin 2.3 (L) 3.5 - 5.0 g/dL   AST 28 15 - 41 U/L   ALT 15 14 - 54 U/L   Alkaline Phosphatase 193 (  H) 38 - 126 U/L   Total Bilirubin 0.7 0.3 - 1.2 mg/dL   GFR calc non Af Amer >60 >60 mL/min   GFR calc Af Amer >60 >60 mL/min   Anion gap 6 5 - 15  Magnesium     Status: None   Collection Time: 02/16/16  6:30 AM  Result Value Ref Range   Magnesium 2.2 1.7 - 2.4 mg/dL  Phosphorus     Status: None   Collection Time: 02/16/16  6:30 AM  Result Value Ref Range   Phosphorus 4.1 2.5 - 4.6 mg/dL  Prepare RBC     Status: None   Collection Time: 02/16/16  9:00 AM  Result Value Ref Range   Order Confirmation ORDER PROCESSED BY BLOOD BANK    *Note: Due to a large number of results and/or encounters for the requested time period, some results have not been displayed. A complete set of results can be found in Results Review.    Studies/Results: No results found.  Medications: Reviewed  '@PROBHOSP'$ @  1 LV dysfunction  LVEF 45 to 50% on echo from Feb 2017    Volume status is OK  BP is a better with drop in b blicker dose   Will plan to check back on Monday.  2  PAF   No apparent recurrence  Not a candidate for anticoag due to bleeding    LOS: 1 day   Dorris Carnes 02/16/2016, 10:13 AM

## 2016-02-16 NOTE — Progress Notes (Signed)
Diamond Boyd   DOB:Apr 19, 1940   DV#:[id number]    Subjective: She feels weak. She continues to have intermittent melena and passage of bright red blood per rectum, likely due to hemorrhoids She denies further nosebleed. Denies chest pain or shortness of breath  Objective:  Vitals:   02/16/16 0018 02/16/16 0430  BP: 124/62 118/67  Pulse:  95  Resp: 18 18  Temp:  97.8 F (36.6 C)     Intake/Output Summary (Last 24 hours) at 02/16/16 0735 Last data filed at 02/15/16 2122  Gross per 24 hour  Intake              360 ml  Output                0 ml  Net              360 ml    GENERAL:alert, no distress and comfortable SKIN: skin colorIs pale, texture, turgor are normal, no rashes or significant lesions EYES: normal, Conjunctiva are pale and non-injected, sclera clear OROPHARYNX:no exudate, no erythema and lips, buccal mucosa, and tongue normal  NECK: supple, thyroid normal size, non-tender, without nodularity LYMPH:  no palpable lymphadenopathy in the cervical, axillary or inguinal LUNGS: clear to auscultation and percussion with normal breathing effort HEART: regular rate & rhythm and no murmurs and no lower extremity edema ABDOMEN:abdomen soft, non-tender and normal bowel sounds Musculoskeletal:no cyanosis of digits and no clubbing  NEURO: alert & oriented x 3 with fluent speech, no focal motor/sensory deficits   Labs:  Lab Results  Component Value Date   WBC 6.7 02/15/2016   HGB 8.4 (L) 02/15/2016   HCT 24.3 (L) 02/15/2016   MCV 96.0 02/15/2016   PLT 67 (L) 02/15/2016   NEUTROABS 0.9 (L) 02/15/2016    Lab Results  Component Value Date   NA 134 (L) 02/16/2016   K 3.6 02/16/2016   CL 101 02/16/2016   CO2 27 02/16/2016    Studies:  Dg Chest 2 View  Result Date: 02/14/2016 CLINICAL DATA:  Shortness of breath, weakness, and chest pain. History of asthma, CHF, EXAM: CHEST  2 VIEW COMPARISON:  Chest x-ray of January 08 2016 FINDINGS: The lungs are mildly hyperinflated  with mild hemidiaphragm flattening. There is no focal infiltrate. There is no pleural effusion. The heart and pulmonary vascularity are normal. The power port catheter tip projects over the midportion of the SVC. There is calcification in the wall of the aortic arch. The bony thorax exhibits no acute abnormality. There is mild multilevel degenerative disc disease of the thoracic spine. IMPRESSION: Mild chronic bronchitic change. No pneumonia, CHF, nor other acute cardiopulmonary abnormality. Thoracic aortic atherosclerosis. Electronically Signed   By: David  Martinique M.D.   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 Recommend transfusion as needed to keep hemoglobin above 8 g She will need irradiated blood products Will order anemia studies, pending  Recurrent nosebleed Mild coagulopathy Severe thrombocytopenia She had received platelet transfusion on 02/14/2016 Coagulation study came back abnormal Mixing studies pending I recommend further blood transfusion if she continues to have significant active bleeding or platelet count less than 20,000  Chronic recurrent sinusitis This has been a major issue as a cause recurrent severe  infection to the point that after multiple courses of antibiotic therapy, she developed recent Clostridium difficile infection I'm concerned about atypical infection such as fungal infection affecting her sinus I have spoken with the ENT many times to consider repeat evaluation with endoscopy and biopsy I spoke with Dr. Redmond Baseman for consideration with surgical option as inpatient week Appreciate cardiology clearance  Congestive heart failure Appreciate cardiology consultation  Severe  protein calorie malnutrition Recommend dietitian consult  DVT prophylaxis I do not recommend Lovenox due to recent bleeding and coagulopathy  Discharge planning She is too ill to be discharged I recommend potential transfer her to [facility] next week so that ENT service can perform sinus surgery  [name], MD 02/16/2016  7:35 AM

## 2016-02-16 NOTE — Progress Notes (Signed)
PROGRESS NOTE    Diamond Boyd  QPY:[id number] DOB: 04/23/1940 DOA: 02/14/2016 PCP: [name], MD  Brief Narrative:  Diamond Boyd is a 76 y.o. female with medical history of colon cancer, Hodgkin's lymphoma, follicular B cell lymphoma in remission presents with two-week history of intermittent epistaxis. The patient states that over the last 2-3 days she has had 2 episodes of epistaxis that have lasted for a longer period of time, approximately hours in duration. Her most recent episode of epistaxis began around 5 AM on the day of admission. Because she continued to bleed, she presented to the emergency department for further evaluation. The patient states that she has been digitally manipulating her nares and pulling out the clots which has resulted in further bleeding on occasion. However, she states that there are other times that even without digital manipulation of her nurse, she spontaneously bleeds. The patient denies any dizziness, chest pain but complains of some mild dyspnea on exertion which has been unchanged in the past couple weeks. She states that her weights have been stable in the past 2 weeks. In fact, she states that she is at her lowest weight that she has been for some time. The patient states that her last weight was 221 pounds. She denies worsening leg edema, orthopnea, or increasing abdominal girth. She states that she has had some vomitus with blood, but states that this has only occurred after she has had epistaxis. In addition, she has occasional hemoptysis, but this only occurs in the setting for epistaxis. She denies any abdominal pain, dysuria, hematuria, hematochezia. She has noticed some "dark stools". In addition, the patient has been having some intermittent fevers for the last 48 hours ranging from 101.23F and 103F.  In the emergency department, the patient had a low-grade temperature of 99.23F but was hemodynamically stable saturating 96% on room air. The patient  was given Afrin which helped stop her epistaxis and had left anterior and posterior nasal packing done. BMP showed a sodium 133 otherwise was unremarkable. CBC showed hemoglobin 9.1 with platelets 16,000. Was admitted for Epistaxis and Hematology/Oncology, ENT, and Cardiology were consulted.  Assessment & Plan:   Active Problems:   Chronic maxillary sinusitis   Thrombocytopenia (HCC)   Benign essential HTN   PAF (paroxysmal atrial fibrillation) (HCC)   Epistaxis   Pancytopenia, acquired ([place])   Acute blood loss anemia   Chronic combined systolic and diastolic CHF (congestive heart failure) (HCC)   Dyspnea  Epistaxis with worsening Thrombocytopenia -Patient is s/p Left Anterior and Posterior Nasal Packing done by Dr. Redmond Baseman -The patient follows Dr. Alvy Bimler for pancytopenia. She has been consulted to follow the patient and appreciate Recc's and Further Evaluation -Patient is s/pTransfuse 1 unit of platelets; Ordered another 1 unit of platelets today -Dr. Alvy Bimler ordered IVIG during admission -Discussed the importance of avoiding digital manipulation of her nares -PTT 38 -INR 1.13 -ENT Consulted and appreciate evaluation and recc's -Patient's Platelet Count went from 16 -> 67 -> 31 -Dr. Alvy Bimler ordered Coagulation studies and because they were abnormal has ordered a mixing study -Repeat CBC in AM  Fever with Dyspnea/cough likely from Rhinovirus -Influenza A via PCR negative -Respiratory Viral Panel Positive for Rhinovirus/Enterovirus -CXR showed Mild chronic bronchitic change. No pneumonia, CHF, nor other acute cardiopulmonary abnormality. Thoracic aortic atherosclerosis. -UA was Negative -Patient was febrile today at 101.5. ? Sinusitis -Recent C. Difficile Colitis from Abx use  Chronic Sinusitis -C/w Loratidine 10 mg po Daily -S/p Multiple Courses of Abx  Therapy -Dr. Alvy Bimler coordinating with Dr. Redmond Baseman in ENT about possible Endoscopy and Biopsy -Cardiology Clearance  appreciated For possible ENT procedure.   Acute blood loss anemia secondary to Epistaxis vs. Cancer -Baseline hemoglobin 10-11 -Presenting hemoglobin 9.1 and now currently 8.4 -> 7.8 -Per Dr. Alvy Bimler Recommend transfusion as needed to keep Hb above 8 -Anemia Studies ordered by Oncology including CBC, Ferritin, Iron and TIBC, Reticulocytes, Vitamin B12 -Iron level was 28, UIBC was 228, TIBC was 256, Ferritin was 1,030, B12 was 613 -Type and Screened and Ordered 2 units of blood -Repeat CBC in AM  Chronic systolic and diastolic CHF -35/59/7416 echo EF 45-50%, grade 2 DD -She is euvolemic clinically -Daily weights -C/w Furosemide 20 mg po qDaily -Losartan on hold secondary to soft blood pressure -C/w Metoprolol 12.5 mg po BID -Cardiology Dr. Marlou Porch evaluated and appreciate Recc's and Preoperative Clearance  Paroxysmal Atrial Fibrillation -Not a candidate for anticoagulation secondary to her pancytopenia -Presently in sinus rhythm -CHADSVASc = 6 -Continue to Monitor HR on Telemetry   Hypertension -Hold Losartan and Continue Metoprolol Tartrate at a lower dose as discussed above  Hyperlipidemia -C/w Simvastatin 40 mg po Daily  Follicular B-cell lymphoma -Recent PET scan shows the patient has no definitive signs of cancer but there is hypermetabolic activity noted around bone marrow area -Follow up with Dr. Alvy Bimler -Appreciate Dr. Calton Dach evaluation and Recc's  Hematemesis/Hemoptysis/Melena/BRBPR -Likely resulting from blood from her epistaxis and not true hematemesis or hemoptysis as these have occurred only in the setting of her Epistaxis; Melena likely from swallowed Blood -Patient's Hb/Hct went from 9.1/27.5 -> 8.4/24.3 -> 7.8/22.5 -Will Transfuse 2 units of Irradiated pRBC's -Discussed with [facility] Dr. 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 Redmond Baseman  Oncology Dr. Alvy Bimler  Cardiology Dr. Candee Furbish  Gastroenterology Dr. Carlean Purl via telephone Consultation.    Procedures: Nasal Packing   Antimicrobials: None   Subjective: Seen and examined at bedside and stated she was having black stools yesterday. Felt weaker today and denied any bleeding from nose today. No CP or SOB. No other complaints or concerns.  Objective: Vitals:   02/16/16 1442 02/16/16 1550 02/16/16 1634 02/16/16 1849  BP: 107/61 (!) 114/47 (!) 123/48 (!) 125/52  Pulse: 83 80 84 86  Resp: '20 20 16 20  '$ Temp: 98.9 F (37.2 C) 98.6 F (37 C) 98.3 F (36.8 C) 100.1 F (37.8 C)  TempSrc: Oral  Oral Oral  SpO2: 97% 94% 94%   Weight:      Height:        Intake/Output Summary (Last 24 hours) at 02/16/16 1853 Last data filed at 02/16/16 1849  Gross per 24 hour  Intake  1439.5 ml  Output              950 ml  Net            489.5 ml   Filed Weights   02/14/16 1521 02/15/16 0900 02/16/16 0431  Weight: 99.3 kg (219 lb) 99.5 kg (219 lb 6.4 oz) 98.4 kg (217 lb)    Examination: Physical Exam:  Constitutional: WN/WD, NAD and appears calm and comfortable Eyes: Lids and  conjunctivae normal, sclerae anicteric  ENMT: External Ears, Left Nare Packed and Right nare with dry blood. Grossly normal hearing.  Neck: Appears normal, supple, no cervical masses, normal ROM, no appreciable thyromegaly, no JVD Respiratory: Clear to auscultation bilaterally, no wheezing, rales, rhonchi or crackles. Normal respiratory effort and patient is not tachypenic. No accessory muscle use.  Cardiovascular: RRR, no murmurs / rubs / gallops. S1 and S2 auscultated. 1+ LE extremity edema. Abdomen: Soft, non-tender, non-distended. No masses palpated. No appreciable hepatosplenomegaly. Bowel sounds positive x4.  GU: Deferred. Musculoskeletal: No clubbing / cyanosis of digits/nails. No joint deformity upper and lower extremities. No contractures.  Skin: No rashes, lesions, ulcers. No induration; Warm and dry.  Neurologic: CN 2-12 grossly intact with no focal deficits. Sensation intact in all 4 Extremities. Romberg sign cerebellar reflexes not assessed.  Psychiatric: Normal judgment and insight. Alert and oriented x 3. Normal mood and appropriate affect.   Data Reviewed: I have personally reviewed following labs and imaging studies  CBC:  Recent Labs Lab 02/14/16 1221 02/15/16 0540 02/16/16 0630  WBC 7.8 6.7 7.4  NEUTROABS 1.0* 0.9* 0.9*  HGB 9.1* 8.4* 7.8*  HCT 27.5* 24.3* 22.5*  MCV 96.2 96.0 95.7  PLT 16* 67* 31*   Basic Metabolic Panel:  Recent Labs Lab 02/14/16 1221 02/15/16 0540 02/16/16 0630  NA 133* 139 134*  K 4.4 3.6 3.6  CL 100* 103 101  CO2 '27 28 27  '$ GLUCOSE 122* 97 102*  BUN '19 16 15  '$ CREATININE 0.67 0.52 0.64  CALCIUM 8.9 8.5* 8.2*  MG  --   --  2.2  PHOS  --   --  4.1   GFR: Estimated Creatinine Clearance: 69.5 mL/min (by C-G formula based on SCr of 0.64 mg/dL). Liver Function Tests:  Recent Labs Lab 02/15/16 0540 02/16/16 0630  AST 26 28  ALT 16 15  ALKPHOS 217* 193*  BILITOT 0.9 0.7  PROT 5.2* 6.6  ALBUMIN 2.6* 2.3*   No results for  input(s): LIPASE, AMYLASE in the last 168 hours. No results for input(s): AMMONIA in the last 168 hours. Coagulation Profile:  Recent Labs Lab 02/14/16 1221  INR 1.13   Cardiac Enzymes: No results for input(s): CKTOTAL, CKMB, CKMBINDEX, TROPONINI in the last 168 hours. BNP (last 3 results) No results for input(s): PROBNP in the last 8760 hours. HbA1C: No results for input(s): HGBA1C in the last 72 hours. CBG: No results for input(s): GLUCAP in the last 168 hours. Lipid Profile: No results for input(s): CHOL, HDL, LDLCALC, TRIG, CHOLHDL, LDLDIRECT in the last 72 hours. Thyroid Function Tests: No results for input(s): TSH, T4TOTAL, FREET4, T3FREE, THYROIDAB in the last 72 hours. Anemia Panel:  Recent Labs  02/16/16 0630 02/16/16 0750  VITAMINB12  --  613  FERRITIN  --  1,030*  TIBC  --  256  IRON  --  28  RETICCTPCT 4.5*  --    Sepsis Labs: No results for input(s): PROCALCITON, LATICACIDVEN in the last 168 hours.  Recent Results (from the  past 240 hour(s))  Respiratory Panel by PCR     Status: Abnormal   Collection Time: 02/14/16  3:43 PM  Result Value Ref Range Status   Adenovirus NOT DETECTED NOT DETECTED Final   Coronavirus 229E NOT DETECTED NOT DETECTED Final   Coronavirus HKU1 NOT DETECTED NOT DETECTED Final   Coronavirus NL63 NOT DETECTED NOT DETECTED Final   Coronavirus OC43 NOT DETECTED NOT DETECTED Final   Metapneumovirus NOT DETECTED NOT DETECTED Final   Rhinovirus / Enterovirus DETECTED (A) NOT DETECTED Final   Influenza A NOT DETECTED NOT DETECTED Final   Influenza B NOT DETECTED NOT DETECTED Final   Parainfluenza Virus 1 NOT DETECTED NOT DETECTED Final   Parainfluenza Virus 2 NOT DETECTED NOT DETECTED Final   Parainfluenza Virus 3 NOT DETECTED NOT DETECTED Final   Parainfluenza Virus 4 NOT DETECTED NOT DETECTED Final   Respiratory Syncytial Virus NOT DETECTED NOT DETECTED Final   Bordetella pertussis NOT DETECTED NOT DETECTED Final   Chlamydophila  pneumoniae NOT DETECTED NOT DETECTED Final   Mycoplasma pneumoniae NOT DETECTED NOT DETECTED Final    Comment: Performed at [facility]  Urine culture     Status: Abnormal   Collection Time: 02/14/16  3:43 PM  Result Value Ref Range Status   Specimen Description URINE, CLEAN CATCH  Final   Special Requests NONE  Final   Culture MULTIPLE SPECIES PRESENT, SUGGEST RECOLLECTION (A)  Final   Report Status 02/16/2016 FINAL  Final    Radiology Studies: No results found. Scheduled Meds: . acidophilus  1 capsule Oral Daily  . acyclovir  400 mg Oral Daily  . calcium-vitamin D  1 tablet Oral QPM  . cholecalciferol  1,000 Units Oral Daily  . estradiol  1 mg Oral Daily  . FLUoxetine  20 mg Oral Daily  . fluticasone furoate-vilanterol  1 puff Inhalation QPM  . furosemide  20 mg Oral Daily  . gabapentin  300 mg Oral BID  . levothyroxine  112 mcg Oral QAC breakfast  . loratadine  10 mg Oral Daily  . metoprolol  12.5 mg Oral BID  . multivitamin with minerals  1 tablet Oral QPM  . pantoprazole (PROTONIX) IV  40 mg Intravenous Q12H  . simvastatin  40 mg Oral QHS   Continuous Infusions:   LOS: 1 day   Kerney Elbe, DO [facility] Pager [phone]  If 7PM-7AM, please contact night-coverage [email or web address] Password [facility] 02/16/2016, 6:53 PM

## 2016-02-17 LAB — COMPREHENSIVE METABOLIC PANEL
ALBUMIN: 2.4 g/dL — AB (ref 3.5–5.0)
ALT: 18 U/L (ref 14–54)
AST: 35 U/L (ref 15–41)
Alkaline Phosphatase: 241 U/L — ABNORMAL HIGH (ref 38–126)
Anion gap: 6 (ref 5–15)
BUN: 13 mg/dL (ref 6–20)
CHLORIDE: 102 mmol/L (ref 101–111)
CO2: 26 mmol/L (ref 22–32)
Calcium: 8.1 mg/dL — ABNORMAL LOW (ref 8.9–10.3)
Creatinine, Ser: 0.58 mg/dL (ref 0.44–1.00)
GFR calc Af Amer: >60 mL/min (ref >60)
Glucose, Bld: 110 mg/dL — ABNORMAL HIGH (ref 65–99)
POTASSIUM: 3.6 mmol/L (ref 3.5–5.1)
SODIUM: 134 mmol/L — AB (ref 135–145)
Total Bilirubin: 1.4 mg/dL — ABNORMAL HIGH (ref 0.3–1.2)
Total Protein: 6.3 g/dL — ABNORMAL LOW (ref 6.5–8.1)

## 2016-02-17 LAB — CBC
HEMATOCRIT: 27.8 % — AB (ref 36.0–46.0)
HEMOGLOBIN: 9.7 g/dL — AB (ref 12.0–15.0)
MCH: 32.1 pg (ref 26.0–34.0)
MCHC: 34.9 g/dL (ref 30.0–36.0)
MCV: 92.1 fL (ref 78.0–100.0)
Platelets: 34 10*3/uL — ABNORMAL LOW (ref 150–400)
RBC: 3.02 MIL/uL — ABNORMAL LOW (ref 3.87–5.11)
RDW: 20.4 % — ABNORMAL HIGH (ref 11.5–15.5)
WBC: 7.9 10*3/uL (ref 4.0–10.5)

## 2016-02-17 LAB — PHOSPHORUS: Phosphorus: 2.6 mg/dL (ref 2.5–4.6)

## 2016-02-17 LAB — OCCULT BLOOD X 1 CARD TO LAB, STOOL: Fecal Occult Bld: NEGATIVE

## 2016-02-17 LAB — MAGNESIUM: Magnesium: 2.1 mg/dL (ref 1.7–2.4)

## 2016-02-17 NOTE — Progress Notes (Signed)
PROGRESS NOTE    Diamond Boyd  BJY:[id number] DOB: 04/11/1940 DOA: 02/14/2016 PCP: [name], MD  Brief Narrative:  Diamond Boyd is a 76 y.o. female with medical history of colon cancer, Hodgkin's lymphoma, follicular B cell lymphoma in remission presents with two-week history of intermittent epistaxis. The patient states that over the last 2-3 days she has had 2 episodes of epistaxis that have lasted for a longer period of time, approximately hours in duration. Her most recent episode of epistaxis began around 5 AM on the day of admission. Because she continued to bleed, she presented to the emergency department for further evaluation. The patient states that she has been digitally manipulating her nares and pulling out the clots which has resulted in further bleeding on occasion. However, she states that there are other times that even without digital manipulation of her nurse, she spontaneously bleeds. The patient denies any dizziness, chest pain but complains of some mild dyspnea on exertion which has been unchanged in the past couple weeks. She states that her weights have been stable in the past 2 weeks. In fact, she states that she is at her lowest weight that she has been for some time. The patient states that her last weight was 221 pounds. She denies worsening leg edema, orthopnea, or increasing abdominal girth. She states that she has had some vomitus with blood, but states that this has only occurred after she has had epistaxis. In addition, she has occasional hemoptysis, but this only occurs in the setting for epistaxis. She denies any abdominal pain, dysuria, hematuria, hematochezia. She has noticed some "dark stools". In addition, the patient has been having some intermittent fevers for the last 48 hours ranging from 101.454F and 103F.  In the emergency department, the patient had a low-grade temperature of 99.454F but was hemodynamically stable saturating 96% on room air. The patient  was given Afrin which helped stop her epistaxis and had left anterior and posterior nasal packing done. BMP showed a sodium 133 otherwise was unremarkable. CBC showed hemoglobin 9.1 with platelets 16,000. Was admitted for Epistaxis and Hematology/Oncology, ENT, and Cardiology were consulted.  Assessment & Plan:   Active Problems:   Chronic maxillary sinusitis   Thrombocytopenia (HCC)   Benign essential HTN   PAF (paroxysmal atrial fibrillation) (HCC)   Epistaxis   Pancytopenia, acquired ([place])   Acute blood loss anemia   Chronic combined systolic and diastolic CHF (congestive heart failure) (HCC)   Dyspnea   Melena  Epistaxis with worsening Thrombocytopenia -Patient is s/p Left Anterior and Posterior Nasal Packing done by Dr. Redmond Baseman; Likely to be removed tomorrow -The patient follows Dr. Alvy Bimler for pancytopenia. She has been consulted to follow the patient and appreciate Recc's and Further Evaluation -Patient is s/p Transfusion of 2 unit of platelets; May consider another 1 unit of platelets today -Dr. Alvy Bimler ordered IVIG during admission -Discussed the importance of avoiding digital manipulation of her nares -PTT 38 -INR 1.13 -ENT Consulted and appreciate evaluation and recc's -Patient's Platelet Count went from 16 -> 67 -> 31 -> 34 -Dr. Alvy Bimler ordered Coagulation studies and because they were abnormal has ordered a mixing study -Repeat CBC in AM  Fever with Dyspnea/cough likely from Rhinovirus -Influenza A via PCR negative -Respiratory Viral Panel Positive for Rhinovirus/Enterovirus -CXR showed Mild chronic bronchitic change. No pneumonia, CHF, nor other acute cardiopulmonary abnormality. Thoracic aortic atherosclerosis. -UA was Negative -Patient was febrile today but improved to 99.3. ? Sinusitis -Recent C. Difficile Colitis from Abx  use -Holding off on Abx at this point as patient has no Leukocytosis and with recent Hx of C. Diff  Chronic Sinusitis -C/w Loratidine 10  mg po Daily -S/p Multiple Courses of Abx Therapy -Dr. Alvy Bimler coordinating with Dr. Redmond Baseman in ENT about possible Endoscopy and Biopsy -Cardiology Clearance appreciated For possible ENT procedure.   Acute blood loss anemia secondary to Epistaxis vs. Cancer s/p transfusion of 2 units of pRBC -Baseline hemoglobin 10-11 -Presenting hemoglobin 9.1 and now currently 9.7 after transfusion -Per Dr. Alvy Bimler Recommend transfusion as needed to keep Hb above 8 -Anemia Studies ordered by Oncology including CBC, Ferritin, Iron and TIBC, Reticulocytes, Vitamin B12 -Iron level was 28, UIBC was 228, TIBC was 256, Ferritin was 1,030, B12 was 613 -Type and Screened and is s/p 2 units of blood -Repeat CBC in AM  Chronic systolic and diastolic CHF -16/02/930 echo EF 45-50%, grade 2 DD -She is euvolemic clinically -Daily weights -C/w Furosemide 20 mg po qDaily -Losartan on hold secondary to soft blood pressure -C/w Metoprolol 12.5 mg po BID -Cardiology Dr. Marlou Porch evaluated and appreciate Recc's and Preoperative Clearance  Paroxysmal Atrial Fibrillation -Not a candidate for anticoagulation secondary to her pancytopenia -Presently in sinus rhythm -CHADSVASc = 6 -Continue to Monitor HR on Telemetry   Hypertension -Hold Losartan and Continue Metoprolol Tartrate at a lower dose as discussed above  Hyperlipidemia -C/w Simvastatin 40 mg po Daily  Follicular B-cell lymphoma -Recent PET scan shows the patient has no definitive signs of cancer but there is hypermetabolic activity noted around bone marrow area -Follow up with Dr. Alvy Bimler -Appreciate Dr. Calton Dach evaluation and Recc's  Hematemesis/Hemoptysis/Melena/BRBPR -Likely resulting from blood from her epistaxis and not true hematemesis or hemoptysis as these have occurred only in the setting of her Epistaxis; Melena likely from swallowed Blood -Patient's Hb/Hct went from 9.1/27.5 -> 8.4/24.3 -> 7.8/22.5 -> 9.7/27.8 -S/p Transfuse 2 units of  Irradiated pRBC's -Discussed with [facility] Dr. Carlean Purl for possible GI Bleed however he states it is likely from swallowed Blood from Epistaxis -FOBT Positive earlier on admission; Attributes bright red blood from hemorrohids  -REPEAT FOBT NEGATIVE 02/17/16 -Will continue to monitor CBC and for S/Sx of Bleeding -C/w Patient on IV Protonix 40 mg BID  Lower Extremity Pain and Edema -Venous duplex done and preliminary findings show no evidence of DVT or Bakers Cyst in the Lower Extremities   Asthma -Stable clinically -Continue Breo Ellipta 1 puff Inhalation and Albuterol 2.5 Neb q6hprn  Neuropathy secondary to chemotherapy -Continue Gabapentin  Hypothyroidism -Continue Synthroid 112 mcg  Impaired Glucose Tolerance -10/24/2015 hemoglobin A1c 6.4 -Will not start sliding scale -Continue to Monitor BS  DVT prophylaxis: SCD's because of Thrombocytopenia Code Status: FULL CODE Family Communication: Discussed with Husband at bedside Disposition Plan: Remain Inpatient  Consultants:   ENT Dr. Redmond Baseman  Oncology Dr. Alvy Bimler  Cardiology Dr. Candee Furbish  Gastroenterology Dr. Carlean Purl via telephone Consultation.    Procedures: Nasal Packing   Antimicrobials: None  Subjective: Seen and examined at bedside and stated she was feeling better after the blood was transfused. No N/V and did not have a stool this Am. No other concerns or complaints however was still mildly febrile.   Objective: Vitals:   02/17/16 1057 02/17/16 1403 02/17/16 1911 02/17/16 2056  BP:  (!) 129/55    Pulse:  84    Resp:  14    Temp:  98.2 F (36.8 C) 99.3 F (37.4 C)   TempSrc:  Oral Oral   SpO2:  96%  91%  Weight: 100.7 kg (222 lb)     Height:        Intake/Output Summary (Last 24 hours) at 02/17/16 2108 Last data filed at 02/17/16 1553  Gross per 24 hour  Intake           1025.5 ml  Output                1 ml  Net           1024.5 ml   Filed Weights   02/15/16 0900  02/16/16 0431 02/17/16 1057  Weight: 99.5 kg (219 lb 6.4 oz) 98.4 kg (217 lb) 100.7 kg (222 lb)    Examination: Physical Exam:  Constitutional: WN/WD, NAD and appears calm and comfortable Eyes: Lids and conjunctivae normal, sclerae anicteric  ENMT: External Ears, Left Nare Packed. Grossly normal hearing.  Neck: Appears normal, supple, no cervical masses, normal ROM, no appreciable thyromegaly, no JVD Respiratory: Clear to auscultation bilaterally, no wheezing, rales, rhonchi or crackles. Normal respiratory effort and patient is not tachypenic. No accessory muscle use.  Cardiovascular: RRR, no murmurs / rubs / gallops. S1 and S2 auscultated. Mild LE extremity edema. Abdomen: Soft, non-tender, non-distended. No masses palpated. No appreciable hepatosplenomegaly. Bowel sounds positive x4.  GU: Deferred. Musculoskeletal: No clubbing / cyanosis of digits/nails. No joint deformity upper and lower extremities. No contractures.  Skin: No rashes, lesions, ulcers. No induration; Warm and dry.  Neurologic: CN 2-12 grossly intact with no focal deficits. Sensation intact in all 4 Extremities. Romberg sign cerebellar reflexes not assessed.  Psychiatric: Normal judgment and insight. Alert and oriented x 3. Normal mood and appropriate affect.   Data Reviewed: I have personally reviewed following labs and imaging studies  CBC:  Recent Labs Lab 02/14/16 1221 02/15/16 0540 02/16/16 0630 02/17/16 0530  WBC 7.8 6.7 7.4 7.9  NEUTROABS 1.0* 0.9* 0.9*  --   HGB 9.1* 8.4* 7.8* 9.7*  HCT 27.5* 24.3* 22.5* 27.8*  MCV 96.2 96.0 95.7 92.1  PLT 16* 67* 31* 34*   Basic Metabolic Panel:  Recent Labs Lab 02/14/16 1221 02/15/16 0540 02/16/16 0630 02/17/16 0530  NA 133* 139 134* 134*  K 4.4 3.6 3.6 3.6  CL 100* 103 101 102  CO2 '27 28 27 26  '$ GLUCOSE 122* 97 102* 110*  BUN '19 16 15 13  '$ CREATININE 0.67 0.52 0.64 0.58  CALCIUM 8.9 8.5* 8.2* 8.1*  MG  --   --  2.2 2.1  PHOS  --   --  4.1 2.6    GFR: Estimated Creatinine Clearance: 70.4 mL/min (by C-G formula based on SCr of 0.58 mg/dL). Liver Function Tests:  Recent Labs Lab 02/15/16 0540 02/16/16 0630 02/17/16 0530  AST 26 28 35  ALT '16 15 18  '$ ALKPHOS 217* 193* 241*  BILITOT 0.9 0.7 1.4*  PROT 5.2* 6.6 6.3*  ALBUMIN 2.6* 2.3* 2.4*   No results for input(s): LIPASE, AMYLASE in the last 168 hours. No results for input(s): AMMONIA in the last 168 hours. Coagulation Profile:  Recent Labs Lab 02/14/16 1221  INR 1.13   Cardiac Enzymes: No results for input(s): CKTOTAL, CKMB, CKMBINDEX, TROPONINI in the last 168 hours. BNP (last 3 results) No results for input(s): PROBNP in the last 8760 hours. HbA1C: No results for input(s): HGBA1C in the last 72 hours. CBG: No results for input(s): GLUCAP in the last 168 hours. Lipid Profile: No results for input(s): CHOL, HDL, LDLCALC, TRIG, CHOLHDL, LDLDIRECT in the last 72 hours.  Thyroid Function Tests: No results for input(s): TSH, T4TOTAL, FREET4, T3FREE, THYROIDAB in the last 72 hours. Anemia Panel:  Recent Labs  02/16/16 0630 02/16/16 0750  VITAMINB12  --  613  FERRITIN  --  1,030*  TIBC  --  256  IRON  --  28  RETICCTPCT 4.5*  --    Sepsis Labs: No results for input(s): PROCALCITON, LATICACIDVEN in the last 168 hours.  Recent Results (from the past 240 hour(s))  Respiratory Panel by PCR     Status: Abnormal   Collection Time: 02/14/16  3:43 PM  Result Value Ref Range Status   Adenovirus NOT DETECTED NOT DETECTED Final   Coronavirus 229E NOT DETECTED NOT DETECTED Final   Coronavirus HKU1 NOT DETECTED NOT DETECTED Final   Coronavirus NL63 NOT DETECTED NOT DETECTED Final   Coronavirus OC43 NOT DETECTED NOT DETECTED Final   Metapneumovirus NOT DETECTED NOT DETECTED Final   Rhinovirus / Enterovirus DETECTED (A) NOT DETECTED Final   Influenza A NOT DETECTED NOT DETECTED Final   Influenza B NOT DETECTED NOT DETECTED Final   Parainfluenza Virus 1 NOT DETECTED  NOT DETECTED Final   Parainfluenza Virus 2 NOT DETECTED NOT DETECTED Final   Parainfluenza Virus 3 NOT DETECTED NOT DETECTED Final   Parainfluenza Virus 4 NOT DETECTED NOT DETECTED Final   Respiratory Syncytial Virus NOT DETECTED NOT DETECTED Final   Bordetella pertussis NOT DETECTED NOT DETECTED Final   Chlamydophila pneumoniae NOT DETECTED NOT DETECTED Final   Mycoplasma pneumoniae NOT DETECTED NOT DETECTED Final    Comment: Performed at [facility]  Urine culture     Status: Abnormal   Collection Time: 02/14/16  3:43 PM  Result Value Ref Range Status   Specimen Description URINE, CLEAN CATCH  Final   Special Requests NONE  Final   Culture MULTIPLE SPECIES PRESENT, SUGGEST RECOLLECTION (A)  Final   Report Status 02/16/2016 FINAL  Final  Culture, blood (routine x 2)     Status: None (Preliminary result)   Collection Time: 02/15/16 10:09 PM  Result Value Ref Range Status   Specimen Description RIGHT ANTECUBITAL  Final   Special Requests BOTTLES DRAWN AEROBIC AND ANAEROBIC 8CC  Final   Culture   Final    NO GROWTH 1 DAY Performed at [facility]    Report Status PENDING  Incomplete  Culture, blood (routine x 2)     Status: None (Preliminary result)   Collection Time: 02/15/16 10:09 PM  Result Value Ref Range Status   Specimen Description RIGHT ANTECUBITAL  Final   Special Requests IN PEDIATRIC BOTTLE 3CC  Final   Culture   Final    NO GROWTH 1 DAY Performed at [facility]    Report Status PENDING  Incomplete    Radiology Studies: No results found. Scheduled Meds: . acidophilus  1 capsule Oral Daily  . acyclovir  400 mg Oral Daily  . calcium-vitamin D  1 tablet Oral QPM  . cholecalciferol  1,000 Units Oral Daily  . estradiol  1 mg Oral Daily  . FLUoxetine  20 mg Oral Daily  . fluticasone furoate-vilanterol  1 puff Inhalation QPM  . furosemide  20 mg Oral Daily  . gabapentin  300 mg Oral BID  . levothyroxine  112 mcg Oral QAC breakfast  .  loratadine  10 mg Oral Daily  . metoprolol  12.5 mg Oral BID  . multivitamin with minerals  1 tablet Oral QPM  . pantoprazole (PROTONIX) IV  40  mg Intravenous Q12H  . simvastatin  40 mg Oral QHS   Continuous Infusions:   LOS: 2 days   Kerney Elbe, DO [facility] Pager [phone]  If 7PM-7AM, please contact night-coverage [email or web address] Password [email or web address] 02/17/2016, 9:08 PM

## 2016-02-17 NOTE — Progress Notes (Signed)
Nursing Note: Pt c/o L ear pain,"sharp"the patient says she had it eralier on the day shift a few times,but it went away".A:T-99.6 P-95 R-24 Bp 140/80 PO2 99% on r/a.Paged on-call and order received for one time dose of ultram.Pt requested tylenol for pain,before ultram was available and stated that her pain was gone within 15 minutes of receiving tylenol.wbb

## 2016-02-17 NOTE — Progress Notes (Signed)
Endorsed to [place].

## 2016-02-18 DIAGNOSIS — I5042 Chronic combined systolic (congestive) and diastolic (congestive) heart failure: Secondary | ICD-10-CM

## 2016-02-18 DIAGNOSIS — R531 Weakness: Secondary | ICD-10-CM

## 2016-02-18 DIAGNOSIS — I10 Essential (primary) hypertension: Secondary | ICD-10-CM

## 2016-02-18 LAB — COMPREHENSIVE METABOLIC PANEL
ALBUMIN: 2.4 g/dL — AB (ref 3.5–5.0)
ALT: 18 U/L (ref 14–54)
ANION GAP: 6 (ref 5–15)
AST: 32 U/L (ref 15–41)
Alkaline Phosphatase: 249 U/L — ABNORMAL HIGH (ref 38–126)
BILIRUBIN TOTAL: 1.1 mg/dL (ref 0.3–1.2)
BUN: 11 mg/dL (ref 6–20)
CO2: 25 mmol/L (ref 22–32)
Calcium: 8.1 mg/dL — ABNORMAL LOW (ref 8.9–10.3)
Chloride: 100 mmol/L — ABNORMAL LOW (ref 101–111)
Creatinine, Ser: 0.67 mg/dL (ref 0.44–1.00)
GFR calc non Af Amer: >60 mL/min (ref >60)
GLUCOSE: 166 mg/dL — AB (ref 65–99)
POTASSIUM: 3.5 mmol/L (ref 3.5–5.1)
SODIUM: 131 mmol/L — AB (ref 135–145)
TOTAL PROTEIN: 6.2 g/dL — AB (ref 6.5–8.1)

## 2016-02-18 LAB — CBC WITH DIFFERENTIAL/PLATELET
BASOS PCT: 0 %
Basophils Absolute: 0 10*3/uL (ref 0.0–0.1)
EOS ABS: 0 10*3/uL (ref 0.0–0.7)
Eosinophils Relative: 0 %
HEMATOCRIT: 30.3 % — AB (ref 36.0–46.0)
Hemoglobin: 10.6 g/dL — ABNORMAL LOW (ref 12.0–15.0)
LYMPHS ABS: 3.5 10*3/uL (ref 0.7–4.0)
Lymphocytes Relative: 59 %
MCH: 32.7 pg (ref 26.0–34.0)
MCHC: 35 g/dL (ref 30.0–36.0)
MCV: 93.5 fL (ref 78.0–100.0)
MONO ABS: 1.1 10*3/uL — AB (ref 0.1–1.0)
MONOS PCT: 19 %
Neutro Abs: 1.2 10*3/uL — ABNORMAL LOW (ref 1.7–7.7)
Neutrophils Relative %: 21 %
Platelets: 24 10*3/uL — CL (ref 150–400)
RBC: 3.24 MIL/uL — ABNORMAL LOW (ref 3.87–5.11)
RDW: 20.2 % — AB (ref 11.5–15.5)
WBC: 5.9 10*3/uL (ref 4.0–10.5)

## 2016-02-18 LAB — TYPE AND SCREEN
BLOOD PRODUCT EXPIRATION DATE: 201801122359
BLOOD PRODUCT EXPIRATION DATE: 201801122359
ISSUE DATE / TIME: 201801061607
ISSUE DATE / TIME: 201801062045
UNIT TYPE AND RH: 6200
Unit Type and Rh: 6200

## 2016-02-18 LAB — PREPARE PLATELET PHERESIS
Blood Product Expiration Date: 201801072359
ISSUE DATE / TIME: 201801061328
Unit Type and Rh: 6200

## 2016-02-18 LAB — MAGNESIUM: Magnesium: 1.9 mg/dL (ref 1.7–2.4)

## 2016-02-18 LAB — PHOSPHORUS: PHOSPHORUS: 2.6 mg/dL (ref 2.5–4.6)

## 2016-02-18 NOTE — Progress Notes (Signed)
PROGRESS NOTE    Diamond Boyd  IWP:[id number] DOB: 11-Aug-1940 DOA: 02/14/2016 PCP: [name], MD  Brief Narrative:  Diamond Boyd is a 76 y.o. female with medical history of colon cancer, Hodgkin's lymphoma, follicular B cell lymphoma in remission presents with two-week history of intermittent epistaxis. The patient states that over the last 2-3 days she has had 2 episodes of epistaxis that have lasted for a longer period of time, approximately hours in duration. Her most recent episode of epistaxis began around 5 AM on the day of admission. Because she continued to bleed, she presented to the emergency department for further evaluation. The patient states that she has been digitally manipulating her nares and pulling out the clots which has resulted in further bleeding on occasion. However, she states that there are other times that even without digital manipulation of her nurse, she spontaneously bleeds. The patient denies any dizziness, chest pain but complains of some mild dyspnea on exertion which has been unchanged in the past couple weeks. She states that her weights have been stable in the past 2 weeks. In fact, she states that she is at her lowest weight that she has been for some time. The patient states that her last weight was 221 pounds. She denies worsening leg edema, orthopnea, or increasing abdominal girth. She states that she has had some vomitus with blood, but states that this has only occurred after she has had epistaxis. In addition, she has occasional hemoptysis, but this only occurs in the setting for epistaxis. She denies any abdominal pain, dysuria, hematuria, hematochezia. She has noticed some "dark stools". In addition, the patient has been having some intermittent fevers for the last 48 hours ranging from 101.57F and 103F.  In the emergency department, the patient had a low-grade temperature of 99.57F but was hemodynamically stable saturating 96% on room air. The patient  was given Afrin which helped stop her epistaxis and had left anterior and posterior nasal packing done. BMP showed a sodium 133 otherwise was unremarkable. CBC showed hemoglobin 9.1 with platelets 16,000. Was admitted for Epistaxis and Hematology/Oncology, ENT, and Cardiology were consulted.  Assessment & Plan:   Active Problems:   Chronic maxillary sinusitis   Thrombocytopenia (HCC)   Benign essential HTN   PAF (paroxysmal atrial fibrillation) (HCC)   Epistaxis   Pancytopenia, acquired ([place])   Acute blood loss anemia   Chronic combined systolic and diastolic CHF (congestive heart failure) (HCC)   Dyspnea   Melena  Epistaxis with worsening Thrombocytopenia -Patient is s/p Left Anterior and Posterior Nasal Packing done by Dr. Redmond Baseman; Likely to be removed tomorrow -The patient follows Dr. Alvy Bimler for pancytopenia. She has been consulted to follow the patient and appreciate Recc's and Further Evaluation -Patient is s/p Transfusion of 2 unit of platelets; May consider another 1 unit of platelets today -Dr. Alvy Bimler ordered IVIG during admission -Discussed the importance of avoiding digital manipulation of her nares -PTT 38 -INR 1.13 -ENT Consulted and appreciate evaluation and recc's -Patient's Platelet Count went from 16 -> 67 -> 31 -> 34 -> 24 -Dr. 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 Alvy Bimler will not replete until tomorrow night and day of the surgery.  Chronic Sinusitis -C/w Loratidine 10 mg po Daily -S/p Multiple Courses of Abx Therapy -Dr. Alvy Bimler coordinating with Dr. Redmond Baseman in ENT about possible Endoscopy and Biopsy; Patient scheduled for  Wednesday at [facility] -Cardiology Clearance done -Will Transfer the patient over to [facility] for Procedure pending Bed Availability  Fever with Dyspnea/cough likely from Rhinovirus -Influenza A via PCR  negative -Respiratory Viral Panel Positive for Rhinovirus/Enterovirus -CXR showed Mild chronic bronchitic change. No pneumonia, CHF, nor other acute cardiopulmonary abnormality. Thoracic aortic atherosclerosis. -UA was Negative -Patient had a Tmax of 100 today. ? Sinusitis - States sinuses felt worse today -Recent C. Difficile Colitis from Abx use -Holding off on Abx at this point as patient has no Leukocytosis and with recent Hx of C. Diff and multiple Abx causing Thromobocytopenia because of Bone Marrow destruction -Continue to Monitor very closely  Acute blood loss anemia secondary to Epistaxis vs. Cancer s/p transfusion of 2 units of pRBC -Baseline hemoglobin 10-11 -Presenting hemoglobin 9.1 and now currently 10.6 after transfusion -Per Dr. Alvy Bimler Recommend transfusion as needed to keep Hb above 8 -Anemia Studies ordered by Oncology including CBC, Ferritin, Iron and TIBC, Reticulocytes, Vitamin B12 -Iron level was 28, UIBC was 228, TIBC was 256, Ferritin was 1,030, B12 was 613 -Type and Screened and is s/p 2 units of blood -Repeat CBC in AM  Chronic systolic and diastolic CHF -98/12/9145 echo EF 45-50%, grade 2 DD -She is euvolemic clinically -Daily weights -C/w Furosemide 20 mg po qDaily -Losartan on hold secondary to soft blood pressure -C/w Metoprolol 12.5 mg po BID -Cardiology Dr. Marlou Porch evaluated and appreciate Recc's and Preoperative Clearance  Paroxysmal Atrial Fibrillation -Not a candidate for anticoagulation secondary to her pancytopenia -Presently in sinus rhythm -CHADSVASc = 6 -Continue to Monitor HR on Telemetry   Hypertension -Hold Losartan and Continue Metoprolol Tartrate at a lower dose as discussed above  Hyperlipidemia -C/w Simvastatin 40 mg po Daily  Follicular B-cell lymphoma -Recent PET scan shows the patient has no definitive signs of cancer but there is hypermetabolic activity noted around bone marrow area -Follow up with Dr.  Alvy Bimler -Appreciate Dr. Calton Dach evaluation and Recc's  Hematemesis/Hemoptysis/Melena/BRBPR -Likely resulting from blood from her epistaxis and not true hematemesis or hemoptysis as these have occurred only in the setting of her Epistaxis; Melena likely from swallowed Blood -Patient's Hb/Hct went from 9.1/27.5 -> 8.4/24.3 -> 7.8/22.5 -> 9.7/27.8 -> 10.6/30.3 -S/p Transfuse 2 units of Irradiated pRBC's -Discussed with [facility] Dr. Carlean Purl for possible GI Bleed however he states it is likely from swallowed Blood from Epistaxis -FOBT Positive earlier on admission; Attributes bright red blood from hemorrohids  -REPEAT FOBT NEGATIVE 02/17/16 -Will continue to monitor CBC and for S/Sx of Bleeding -C/w Patient on IV Protonix 40 mg BID  Lower Extremity Pain and Edema -Venous duplex done and preliminary findings show no evidence of DVT or Bakers Cyst in the Lower Extremities   Asthma -Stable clinically -Continue Breo Ellipta 1 puff Inhalation and Albuterol 2.5 Neb q6hprn  Neuropathy secondary to chemotherapy -Continue Gabapentin  Hypothyroidism -Continue Synthroid 112 mcg  Impaired Glucose Tolerance -10/24/2015 hemoglobin A1c 6.4 -Will not start sliding scale -Continue to Monitor BS  DVT prophylaxis: SCD's because of Thrombocytopenia Code Status: FULL CODE Family Communication: Discussed with Husband at bedside Disposition Plan: Remain Inpatient and Transfer to [facility] for [facility] Procedure scheduled for Wednesday  Consultants:   ENT Dr. Redmond Baseman  Oncology Dr. Alvy Bimler  Cardiology Dr. Candee Furbish  Gastroenterology Dr. Carlean Purl via telephone Consultation.    Procedures: Nasal Packing   Antimicrobials: None  Subjective: Seen and examined and stated she wasn't feeling as well today. A little nauseous and states she her nose  felt stopped up and her sinuses where hurting. Also stated she did not have a BM last night and just felt weak. No other concerns or  complaints at this time.   Objective: Vitals:   02/17/16 2056 02/17/16 2119 02/18/16 0545 02/18/16 1100  BP:  (!) 104/50 109/60   Pulse:  94 (!) 113   Resp:  18 18   Temp:  99.2 F (37.3 C) 98.5 F (36.9 C)   TempSrc:  Oral Oral   SpO2: 91% 91% 94%   Weight:    100.2 kg (220 lb 14.4 oz)  Height:        Intake/Output Summary (Last 24 hours) at 02/18/16 1517 Last data filed at 02/18/16 1345  Gross per 24 hour  Intake              480 ml  Output                0 ml  Net              480 ml   Filed Weights   02/16/16 0431 02/17/16 1057 02/18/16 1100  Weight: 98.4 kg (217 lb) 100.7 kg (222 lb) 100.2 kg (220 lb 14.4 oz)    Examination: Physical Exam:  Constitutional: WN/WD, NAD and appears calm and comfortable Eyes: Lids and conjunctivae normal, sclerae anicteric  ENMT: External Ears, Left Nare Packed. Grossly normal hearing.  Neck: Appears normal, supple, no cervical masses, normal ROM, no appreciable thyromegaly, no JVD Respiratory: Clear to auscultation bilaterally, no wheezing, rales, rhonchi or crackles. Normal respiratory effort and patient is not tachypenic. No accessory muscle use.  Cardiovascular: RRR, no murmurs / rubs / gallops. S1 and S2 auscultated. 1+ Lower extremity edema. Abdomen: Soft, non-tender, non-distended. No masses palpated. No appreciable hepatosplenomegaly. Bowel sounds positive x4.  GU: Deferred. Musculoskeletal: No clubbing / cyanosis of digits/nails. No joint deformity upper and lower extremities. No contractures.  Skin: No rashes, lesions, ulcers. No induration; Warm and dry.  Neurologic: CN 2-12 grossly intact with no focal deficits. Sensation intact in all 4 Extremities. Romberg sign cerebellar reflexes not assessed.  Psychiatric: Normal judgment and insight. Alert and oriented x 3. Normal mood and appropriate affect.   Data Reviewed: I have personally reviewed following labs and imaging studies  CBC:  Recent Labs Lab 02/14/16 1221  02/15/16 0540 02/16/16 0630 02/17/16 0530 02/18/16 0930  WBC 7.8 6.7 7.4 7.9 5.9  NEUTROABS 1.0* 0.9* 0.9*  --  1.2*  HGB 9.1* 8.4* 7.8* 9.7* 10.6*  HCT 27.5* 24.3* 22.5* 27.8* 30.3*  MCV 96.2 96.0 95.7 92.1 93.5  PLT 16* 67* 31* 34* 24*   Basic Metabolic Panel:  Recent Labs Lab 02/14/16 1221 02/15/16 0540 02/16/16 0630 02/17/16 0530 02/18/16 0930  NA 133* 139 134* 134* 131*  K 4.4 3.6 3.6 3.6 3.5  CL 100* 103 101 102 100*  CO2 '27 28 27 26 25  '$ GLUCOSE 122* 97 102* 110* 166*  BUN '19 16 15 13 11  '$ CREATININE 0.67 0.52 0.64 0.58 0.67  CALCIUM 8.9 8.5* 8.2* 8.1* 8.1*  MG  --   --  2.2 2.1 1.9  PHOS  --   --  4.1 2.6 2.6   GFR: Estimated Creatinine Clearance: 70.2 mL/min (by C-G formula based on SCr of 0.67 mg/dL). Liver Function Tests:  Recent Labs Lab 02/15/16 0540 02/16/16 0630 02/17/16 0530 02/18/16 0930  AST 26 28 35 32  ALT '16 15 18 18  '$ ALKPHOS 217* 193* 241* 249*  BILITOT 0.9 0.7 1.4* 1.1  PROT 5.2* 6.6 6.3* 6.2*  ALBUMIN 2.6* 2.3* 2.4* 2.4*   No results for input(s): LIPASE, AMYLASE in the last 168 hours. No results for input(s): AMMONIA in the last 168 hours. Coagulation Profile:  Recent Labs Lab 02/14/16 1221  INR 1.13   Cardiac Enzymes: No results for input(s): CKTOTAL, CKMB, CKMBINDEX, TROPONINI in the last 168 hours. BNP (last 3 results) No results for input(s): PROBNP in the last 8760 hours. HbA1C: No results for input(s): HGBA1C in the last 72 hours. CBG: No results for input(s): GLUCAP in the last 168 hours. Lipid Profile: No results for input(s): CHOL, HDL, LDLCALC, TRIG, CHOLHDL, LDLDIRECT in the last 72 hours. Thyroid Function Tests: No results for input(s): TSH, T4TOTAL, FREET4, T3FREE, THYROIDAB in the last 72 hours. Anemia Panel:  Recent Labs  02/16/16 0630 02/16/16 0750  VITAMINB12  --  613  FERRITIN  --  1,030*  TIBC  --  256  IRON  --  28  RETICCTPCT 4.5*  --    Sepsis Labs: No results for input(s): PROCALCITON,  LATICACIDVEN in the last 168 hours.  Recent Results (from the past 240 hour(s))  Respiratory Panel by PCR     Status: Abnormal   Collection Time: 02/14/16  3:43 PM  Result Value Ref Range Status   Adenovirus NOT DETECTED NOT DETECTED Final   Coronavirus 229E NOT DETECTED NOT DETECTED Final   Coronavirus HKU1 NOT DETECTED NOT DETECTED Final   Coronavirus NL63 NOT DETECTED NOT DETECTED Final   Coronavirus OC43 NOT DETECTED NOT DETECTED Final   Metapneumovirus NOT DETECTED NOT DETECTED Final   Rhinovirus / Enterovirus DETECTED (A) NOT DETECTED Final   Influenza A NOT DETECTED NOT DETECTED Final   Influenza B NOT DETECTED NOT DETECTED Final   Parainfluenza Virus 1 NOT DETECTED NOT DETECTED Final   Parainfluenza Virus 2 NOT DETECTED NOT DETECTED Final   Parainfluenza Virus 3 NOT DETECTED NOT DETECTED Final   Parainfluenza Virus 4 NOT DETECTED NOT DETECTED Final   Respiratory Syncytial Virus NOT DETECTED NOT DETECTED Final   Bordetella pertussis NOT DETECTED NOT DETECTED Final   Chlamydophila pneumoniae NOT DETECTED NOT DETECTED Final   Mycoplasma pneumoniae NOT DETECTED NOT DETECTED Final    Comment: Performed at [facility]  Urine culture     Status: Abnormal   Collection Time: 02/14/16  3:43 PM  Result Value Ref Range Status   Specimen Description URINE, CLEAN CATCH  Final   Special Requests NONE  Final   Culture MULTIPLE SPECIES PRESENT, SUGGEST RECOLLECTION (A)  Final   Report Status 02/16/2016 FINAL  Final  Culture, blood (routine x 2)     Status: None (Preliminary result)   Collection Time: 02/15/16 10:09 PM  Result Value Ref Range Status   Specimen Description RIGHT ANTECUBITAL  Final   Special Requests BOTTLES DRAWN AEROBIC AND ANAEROBIC 8CC  Final   Culture   Final    NO GROWTH 1 DAY Performed at [facility]    Report Status PENDING  Incomplete  Culture, blood (routine x 2)     Status: None (Preliminary result)   Collection Time: 02/15/16 10:09 PM   Result Value Ref Range Status   Specimen Description RIGHT ANTECUBITAL  Final   Special Requests IN PEDIATRIC BOTTLE 3CC  Final   Culture   Final    NO GROWTH 1 DAY Performed at [facility]    Report Status PENDING  Incomplete    Radiology  Studies: No results found. Scheduled Meds: . acidophilus  1 capsule Oral Daily  . acyclovir  400 mg Oral Daily  . calcium-vitamin D  1 tablet Oral QPM  . cholecalciferol  1,000 Units Oral Daily  . estradiol  1 mg Oral Daily  . FLUoxetine  20 mg Oral Daily  . fluticasone furoate-vilanterol  1 puff Inhalation QPM  . furosemide  20 mg Oral Daily  . gabapentin  300 mg Oral BID  . levothyroxine  112 mcg Oral QAC breakfast  . loratadine  10 mg Oral Daily  . metoprolol  12.5 mg Oral BID  . multivitamin with minerals  1 tablet Oral QPM  . pantoprazole (PROTONIX) IV  40 mg Intravenous Q12H  . simvastatin  40 mg Oral QHS   Continuous Infusions:   LOS: 3 days   Kerney Elbe, DO [facility] Pager [phone]  If 7PM-7AM, please contact night-coverage [email or web address] Password [email or web address] 02/18/2016, 3:17 PM

## 2016-02-18 NOTE — Progress Notes (Signed)
Patient Name: Diamond Boyd Date of Encounter: 02/18/2016  Primary Cardiologist: Dr. Clearnce Diamond Boyd Problem List     Active Problems:   Chronic maxillary sinusitis   Thrombocytopenia (HCC)   Benign essential HTN   PAF (paroxysmal atrial fibrillation) (HCC)   Epistaxis   Pancytopenia, acquired ([place])   Acute blood loss anemia   Chronic combined systolic and diastolic CHF (congestive heart failure) (HCC)   Dyspnea   Melena     Subjective   No chest pain, some SOB due to nose packing, surgery planned for Wed.  Inpatient Medications    Scheduled Meds: . acidophilus  1 capsule Oral Daily  . acyclovir  400 mg Oral Daily  . calcium-vitamin D  1 tablet Oral QPM  . cholecalciferol  1,000 Units Oral Daily  . estradiol  1 mg Oral Daily  . FLUoxetine  20 mg Oral Daily  . fluticasone furoate-vilanterol  1 puff Inhalation QPM  . furosemide  20 mg Oral Daily  . gabapentin  300 mg Oral BID  . levothyroxine  112 mcg Oral QAC breakfast  . loratadine  10 mg Oral Daily  . metoprolol  12.5 mg Oral BID  . multivitamin with minerals  1 tablet Oral QPM  . pantoprazole (PROTONIX) IV  40 mg Intravenous Q12H  . simvastatin  40 mg Oral QHS   Continuous Infusions:  PRN Meds: acetaminophen **OR** acetaminophen, albuterol, ondansetron **OR** ondansetron (ZOFRAN) IV   Vital Signs    Vitals:   02/17/16 1911 02/17/16 2056 02/17/16 2119 02/18/16 0545  BP:   (!) 104/50 109/60  Pulse:   94 (!) 113  Resp:   18 18  Temp: 99.3 F (37.4 C)  99.2 F (37.3 C) 98.5 F (36.9 C)  TempSrc: Oral  Oral Oral  SpO2:  91% 91% 94%  Weight:      Height:        Intake/Output Summary (Last 24 hours) at 02/18/16 0832 Last data filed at 02/17/16 1553  Gross per 24 hour  Intake              600 ml  Output                0 ml  Net              600 ml   Filed Weights   02/15/16 0900 02/16/16 0431 02/17/16 1057  Weight: 219 lb 6.4 oz (99.5 kg) 217 lb (98.4 kg) 222 lb (100.7 kg)    Physical Exam    GEN: Well nourished,  in no acute distress.  HEENT: normocephalic, sclera clear, mucus membranes moist.  Neck: Supple, no JVD, or masses. Cardiac: RRR, no murmurs, rubs, or gallops. No clubbing, cyanosis, edema.  Radials/DP/PT 2+ and equal bilaterally.  Respiratory:  Respirations regular and unlabored, clear to auscultation bilaterally without rales, rhonchi or wheezes. GI: Abd -Soft, nontender, nondistended, BS + x 4. MS: no deformity or atrophy. Skin: warm and dry, brisk capillary refill, no obvious rash Neuro:  Alert and oriented X 3 MAE, follows commands Psych: answers questions appropriately,Normal and pleasant affect.   Labs    CBC  Recent Labs  02/16/16 0630 02/17/16 0530  WBC 7.4 7.9  NEUTROABS 0.9*  --   HGB 7.8* 9.7*  HCT 22.5* 27.8*  MCV 95.7 92.1  PLT 31* 34*   Basic Metabolic Panel  Recent Labs  02/16/16 0630 02/17/16 0530  NA 134* 134*  K 3.6 3.6  CL 101 102  CO2 27 26  GLUCOSE 102* 110*  BUN 15 13  CREATININE 0.64 0.58  CALCIUM 8.2* 8.1*  MG 2.2 2.1  PHOS 4.1 2.6   Liver Function Tests  Recent Labs  02/16/16 0630 02/17/16 0530  AST 28 35  ALT 15 18  ALKPHOS 193* 241*  BILITOT 0.7 1.4*  PROT 6.6 6.3*  ALBUMIN 2.3* 2.4*   No results for input(s): LIPASE, AMYLASE in the last 72 hours. Cardiac Enzymes No results for input(s): CKTOTAL, CKMB, CKMBINDEX, TROPONINI in the last 72 hours. BNP Invalid input(s): POCBNP D-Dimer No results for input(s): DDIMER in the last 72 hours. Hemoglobin A1C No results for input(s): HGBA1C in the last 72 hours. Fasting Lipid Panel No results for input(s): CHOL, HDL, LDLCALC, TRIG, CHOLHDL, LDLDIRECT in the last 72 hours. Thyroid Function Tests No results for input(s): TSH, T4TOTAL, T3FREE, THYROIDAB in the last 72 hours.  Invalid input(s): FREET3  Telemetry    No - Personally Reviewed  ECG    On the 4th SR with LBBB - Personally Reviewed  Radiology    No results found.  Cardiac Studies    Echocardiogram: 03/2015 Study Conclusions  - Left ventricle: The cavity size was normal. Wall thickness was normal. Systolic function was mildly reduced. The estimated ejection fraction was in the range of 45% to 50%. Mild diffuse hypokinesis with no identifiable regional variations. Features are consistent with a pseudonormal left ventricular filling pattern, with concomitant abnormal relaxation and increased filling pressure (grade 2 diastolic dysfunction). - Mitral valve: There was mild regurgitation. - Left atrium: The atrium was moderately to severely dilated.   Patient Profile       Ms. Diamond Boyd is a 76 year old female with a past medical history of HTN, chronic systolic CHF, colon cancer, follicular B cell lymphoma in remission  and PAF. She was admitted with two-week history of intermittent epistaxis. Cardiology consulted for CHF/Preoperative risk stratification.    Assessment & Plan    1. History of chronic systolic CHF: Appears euvolemic on exam. Last Echo above. Her home losartan is on hold due to hypotension. BP appears to be soft with SBP's in the 100's. Add back losartan as BP allows. Agree with reducing home metoprolol dose as she was hypotensive. Though now improved.  Will monitor fluid status as she is getting IVIG.  2.Epistaxis: Resolved per primary team. Plan for surgery on Wed.    3. Fever/Cough : Ruling out Flu.   4. Preoperative risk stratification:per Dr. Marlou Boyd " From a cardiac perspective, she may proceed with surgery if felt necessary with mild to moderate risk. She does not appear to be in heart failure, no adverse arrhythmias detected, no anginal symptoms. Be careful with underlying anemia and hypotension."    Signed, Diamond Kicks, NP  02/18/2016, 8:32 AM  [place] Pager [phone]  After 5 or weekends [phone]

## 2016-02-18 NOTE — Progress Notes (Signed)
This is an addendum to my morning notes. I spoke with Dr. Redmond Baseman. Plan to transfer her to [facility] today. Surgery is scheduled for January 10th I plan to order repeat a.m. labs to make sure that her platelet count is adequate and greater than 50,000 I will continue follow-up at [facility]

## 2016-02-18 NOTE — Progress Notes (Signed)
CRITICAL VALUE ALERT  Critical value received:  Platelets 24  Date of notification:  02/18/2016  Time of notification:  10:30  Critical value read back:yes  Nurse who received alert:  Kirkland Hun RN  MD notified (1st page):  Sheikh  Time of first page:  1120  MD notified (2nd page):  Time of second page:  Responding MD:  Alfredia Ferguson  Time MD responded:  [id number]

## 2016-02-18 NOTE — Progress Notes (Signed)
   Subjective:    Patient ID: Diamond Boyd, female    DOB: 09/22/1940, 76 y.o.   MRN: [id number]  HPI Both sides of the nose feel clogged.  There has been no nose bleeding since pack placement.    Review of Systems     Objective:   Physical Exam Alert and oriented Left nasal pack in place, no bleeding.       Assessment & Plan:  Chronic sinusitis, epistaxis, thrombocytopenia Epistaxis remains controlled with pack in place but platelet count remains low in spite of couple of platelet transfusions.  I discussed her case with Dr. Alvy Bimler on Friday.  Cardiology has given reasonable clearance to proceed with endoscopic sinus surgery.  This has been scheduled for Wednesday.  Please transfer patient to [facility] hospital prior to Wednesday.  I will discuss with Dr. Alvy Bimler further platelet transfusion leading into surgery.  The patient and I agreed that it is best to leave the pack in place until surgery on Wednesday.  Risks, benefits, and alternatives of surgery were discussed with the patient and she expressed understanding and agreement.

## 2016-02-18 NOTE — Progress Notes (Signed)
Diamond Boyd   DOB:17-Feb-1940   LO#:[id number]    Subjective: She feels weak. She denies further melena or hematochezia yesterday. Transfusion has made her feel a bit better. The patient denies any recent signs or symptoms of bleeding such as spontaneous epistaxis, hematuria or hematochezia.   Objective:  Vitals:   02/17/16 2119 02/18/16 0545  BP: (!) 104/50 109/60  Pulse: 94 (!) 113  Resp: 18 18  Temp: 99.2 F (37.3 C) 98.5 F (36.9 C)     Intake/Output Summary (Last 24 hours) at 02/18/16 1028 Last data filed at 02/18/16 0944  Gross per 24 hour  Intake              840 ml  Output                0 ml  Net              840 ml    GENERAL:alert, no distress and comfortable SKIN: skin color, texture, turgor are normal, no rashes or significant lesions EYES: normal, Conjunctiva are pink and non-injected, sclera clear OROPHARYNX:no exudate, no erythema and lips, buccal mucosa, and tongue normal. Nasal packing is noted NECK: supple, thyroid normal size, non-tender, without nodularity LYMPH:  no palpable lymphadenopathy in the cervical, axillary or inguinal LUNGS: clear to auscultation and percussion with normal breathing effort HEART: regular rate & rhythm and no murmurs with moderate bilateral lower extremity edema ABDOMEN:abdomen soft, non-tender and normal bowel sounds Musculoskeletal:no cyanosis of digits and no clubbing  NEURO: alert & oriented x 3 with fluent speech, no focal motor/sensory deficits   Labs:  Lab Results  Component Value Date   WBC 5.9 02/18/2016   HGB 10.6 (L) 02/18/2016   HCT 30.3 (L) 02/18/2016   MCV 93.5 02/18/2016   PLT 24 (LL) 02/18/2016   NEUTROABS 1.2 (L) 02/18/2016    Lab Results  Component Value Date   NA 131 (L) 02/18/2016   K 3.5 02/18/2016   CL 100 (L) 02/18/2016   CO2 25 02/18/2016    Assessment & Plan:   History of non-Hodgkin lymphoma Recent PET CT scan showed no definitive signs of cancer recurrence although there is  hypermetabolic activity noted around her bone marrow area She is currently not on treatment  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, 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 Recommend transfusion as needed to keep hemoglobin above 8 g She received 2 units of blood on 02/16/2016 She will need irradiated blood products  Recurrent nosebleed Mild coagulopathy Severe thrombocytopenia She had received platelet transfusion on 02/14/2016 Coagulation study came back abnormal Mixing studies confirmed acquired coagulopathy I recommend further blood transfusion if she continues to have significant active bleeding or platelet count less than 20,000  Chronic recurrent sinusitis This has been a major issue as a cause recurrent severe infection to the point that after multiple courses of antibiotic therapy, she developed recent Clostridium difficile infection I'm concerned about atypical infection such as fungal infection affecting her sinus I have spoken with the ENT many times to consider repeat evaluation with endoscopy and biopsy I spoke with Dr. Redmond Baseman for consideration with surgical option as inpatient week Appreciate cardiology clearance  Congestive heart failure Appreciate cardiology consultation  Severe protein calorie malnutrition Recommend dietitian consult  DVT prophylaxis I do not recommend Lovenox due to recent bleeding and coagulopathy  Discharge planning She is too ill to be discharged I recommend potential transfer her to [facility] so that ENT service can perform sinus surgery  Karleigh Bunte Alvy Bimler,  MD 02/18/2016  10:28 AM

## 2016-02-19 LAB — COMPREHENSIVE METABOLIC PANEL
ALK PHOS: 250 U/L — AB (ref 38–126)
ALT: 18 U/L (ref 14–54)
AST: 31 U/L (ref 15–41)
Albumin: 2.4 g/dL — ABNORMAL LOW (ref 3.5–5.0)
Anion gap: 8 (ref 5–15)
BILIRUBIN TOTAL: 1.1 mg/dL (ref 0.3–1.2)
BUN: 13 mg/dL (ref 6–20)
CALCIUM: 8.2 mg/dL — AB (ref 8.9–10.3)
CO2: 26 mmol/L (ref 22–32)
CREATININE: 0.51 mg/dL (ref 0.44–1.00)
Chloride: 102 mmol/L (ref 101–111)
GFR calc Af Amer: >60 mL/min (ref >60)
GLUCOSE: 101 mg/dL — AB (ref 65–99)
POTASSIUM: 3.8 mmol/L (ref 3.5–5.1)
Sodium: 136 mmol/L (ref 135–145)
Total Protein: 6.3 g/dL — ABNORMAL LOW (ref 6.5–8.1)

## 2016-02-19 LAB — CBC WITH DIFFERENTIAL/PLATELET
BASOS ABS: 0 10*3/uL (ref 0.0–0.1)
BASOS PCT: 0 %
Eosinophils Absolute: 0 10*3/uL (ref 0.0–0.7)
Eosinophils Relative: 0 %
HCT: 30.3 % — ABNORMAL LOW (ref 36.0–46.0)
HEMOGLOBIN: 10.3 g/dL — AB (ref 12.0–15.0)
LYMPHS PCT: 65 %
Lymphs Abs: 4 10*3/uL (ref 0.7–4.0)
MCH: 32.1 pg (ref 26.0–34.0)
MCHC: 34 g/dL (ref 30.0–36.0)
MCV: 94.4 fL (ref 78.0–100.0)
MONO ABS: 1 10*3/uL (ref 0.1–1.0)
Monocytes Relative: 16 %
NEUTROS ABS: 1.1 10*3/uL — AB (ref 1.7–7.7)
NEUTROS PCT: 19 %
Platelets: 23 10*3/uL — CL (ref 150–400)
RBC: 3.21 MIL/uL — AB (ref 3.87–5.11)
RDW: 19.9 % — AB (ref 11.5–15.5)
WBC: 6.1 10*3/uL (ref 4.0–10.5)

## 2016-02-19 LAB — MAGNESIUM: MAGNESIUM: 2 mg/dL (ref 1.7–2.4)

## 2016-02-19 LAB — PHOSPHORUS: Phosphorus: 2.5 mg/dL (ref 2.5–4.6)

## 2016-02-19 MED ORDER — ACETAMINOPHEN 325 MG PO TABS
650.0000 mg | ORAL_TABLET | Freq: Once | ORAL | Status: DC
  Filled 2016-02-19: qty 2

## 2016-02-19 MED ORDER — SODIUM CHLORIDE 0.9 % IV SOLN
Freq: Once | INTRAVENOUS | Status: AC
  Administered 2016-02-20: 01:00:00 via INTRAVENOUS

## 2016-02-19 MED ORDER — DIPHENHYDRAMINE HCL 25 MG PO CAPS
25.0000 mg | ORAL_CAPSULE | Freq: Once | ORAL | Status: AC
  Administered 2016-02-19: 25 mg via ORAL
  Filled 2016-02-19: qty 1

## 2016-02-19 MED ORDER — SODIUM CHLORIDE 0.9 % IV SOLN: Freq: Once | INTRAVENOUS | Status: DC

## 2016-02-19 NOTE — Progress Notes (Signed)
Diamond Boyd   DOB:[birth year]   YK#:[id number]    Subjective: She feels well. Has mild nose bleeding this morning She denies melena or hematochezia She has not been transferred to [facility] due to lack of beds  Objective:  Vitals:   02/18/16 2228 02/19/16 0651  BP: 109/68 131/62  Pulse: 83 99  Resp: 16 16  Temp: 98.1 F (36.7 C) 99.2 F (37.3 C)     Intake/Output Summary (Last 24 hours) at 02/19/16 0900 Last data filed at 02/18/16 1800  Gross per 24 hour  Intake              720 ml  Output                0 ml  Net              720 ml    GENERAL:alert, no distress and comfortable SKIN: skin color, texture, turgor are normal, no rashes or significant lesions EYES: normal, Conjunctiva are pink and non-injected, sclera clear OROPHARYNX:no exudate, no erythema and lips, buccal mucosa, and tongue normal . Her nose is packed Musculoskeletal:no cyanosis of digits and no clubbing  NEURO: alert & oriented x 3 with fluent speech, no focal motor/sensory deficits   Labs:  Lab Results  Component Value Date   WBC 6.1 02/19/2016   HGB 10.3 (L) 02/19/2016   HCT 30.3 (L) 02/19/2016   MCV 94.4 02/19/2016   PLT 23 (LL) 02/19/2016   NEUTROABS 1.1 (L) 02/19/2016    Lab Results  Component Value Date   NA 136 02/19/2016   K 3.8 02/19/2016   CL 102 02/19/2016   CO2 26 02/19/2016    Assessment & Plan:   History of non-Hodgkin lymphoma Recent PET CT scan showed no definitive signs of cancer recurrence although there is hypermetabolic activity noted around her bone marrow area She is currently not on treatment  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, 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 Recommend transfusion as needed to keep hemoglobin above 8 g She received 2 units of blood on 02/16/2016 She will need irradiated blood products  Recurrent nosebleed Mild coagulopathy Severe thrombocytopenia She had received platelet transfusion on 02/14/2016 Coagulation study came back  abnormal Mixing studies confirmed acquired coagulopathy due to consumption, corrected I recommend further blood transfusion if she continues to have significant active bleeding or platelet count less than 20,000  Chronic recurrent sinusitis This has been a major issue as a cause recurrent severe infection to the point that after multiple courses of antibiotic therapy, she developed recent Clostridium difficile infection I'm concerned about atypical infection such as fungal infection affecting her sinus I have spoken with the ENT many times to consider repeat evaluation with endoscopy and biopsy Appreciate cardiology clearance The plan is to get her transferred to [facility] today for surgery tomorrow I will arrange for platelet transfusion tomorrow morning after midnight. We will get her platelet count rechecked at 5 AM and if platelet count is less than 50,000, she would get further platelet transfusion in anticipation for surgery  Congestive heart failure Appreciate cardiology consultation  Severe protein calorie malnutrition Recommend dietitian consult  DVT prophylaxis I do not recommend Lovenox due to recent bleeding and coagulopathy  Discharge planning She is too ill to be discharged I recommend potentialtransfer her to [facility] so that ENT service can perform sinus surgery   [name], MD 02/19/2016  9:00 AM

## 2016-02-19 NOTE — Progress Notes (Signed)
PROGRESS NOTE    BONI MACLELLAN  WRU:[id number] DOB: 08/15/1940 DOA: 02/14/2016 PCP: [name], MD  Brief Narrative:  Diamond Boyd is a 76 y.o. female with medical history of colon cancer, Hodgkin's lymphoma, follicular B cell lymphoma in remission presents with two-week history of intermittent epistaxis. The patient states that over the last 2-3 days she has had 2 episodes of epistaxis that have lasted for a longer period of time, approximately hours in duration. Her most recent episode of epistaxis began around 5 AM on the day of admission. Because she continued to bleed, she presented to the emergency department for further evaluation. The patient states that she has been digitally manipulating her nares and pulling out the clots which has resulted in further bleeding on occasion. However, she states that there are other times that even without digital manipulation of her nurse, she spontaneously bleeds. The patient denies any dizziness, chest pain but complains of some mild dyspnea on exertion which has been unchanged in the past couple weeks. She states that her weights have been stable in the past 2 weeks. In fact, she states that she is at her lowest weight that she has been for some time. The patient states that her last weight was 221 pounds. She denies worsening leg edema, orthopnea, or increasing abdominal girth. She states that she has had some vomitus with blood, but states that this has only occurred after she has had epistaxis. In addition, she has occasional hemoptysis, but this only occurs in the setting for epistaxis. She denies any abdominal pain, dysuria, hematuria, hematochezia. She has noticed some "dark stools". In addition, the patient has been having some intermittent fevers for the last 48 hours ranging from 101.68F and 103F.  In the emergency department, the patient had a low-grade temperature of 99.68F but was hemodynamically stable saturating 96% on room air. The patient  was given Afrin which helped stop her epistaxis and had left anterior and posterior nasal packing done. BMP showed a sodium 133 otherwise was unremarkable. CBC showed hemoglobin 9.1 with platelets 16,000. Was admitted for Epistaxis and Hematology/Oncology, ENT, and Cardiology were consulted.  Assessment & Plan:   Active Problems:   Chronic maxillary sinusitis   Thrombocytopenia (HCC)   Benign essential HTN   PAF (paroxysmal atrial fibrillation) (HCC)   Epistaxis   Pancytopenia, acquired ([place])   Acute blood loss anemia   Chronic combined systolic and diastolic CHF (congestive heart failure) (HCC)   Dyspnea   Melena  Recurrent Epistaxis with worsening Thrombocytopenia, Epistaxis improved -Patient is s/p Left Anterior and Posterior Nasal Packing done by Dr. Redmond Baseman; Likely to be removed tomorrow -The patient follows Dr. Alvy Bimler for pancytopenia. She has been consulted to follow the patient and appreciate Recc's and Further Evaluation -Patient is s/p Transfusion of 2 unit of platelets; May consider another 1 unit of platelets today -Dr. Alvy Bimler ordered IVIG during admission -Discussed the importance of avoiding digital manipulation of her nares -PTT 38 -INR 1.13 -ENT Consulted and appreciate evaluation and recc's -Patient's Platelet Count went from 16 -> 67 -> 31 -> 34 -> 24 -> 23 -Dr. 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 Alvy Bimler will not replete until tomorrow night and day of the surgery. Platelets to be transfused pre-operatively by Hematology/Oncology. PER ONCOLOGY NOTE: We will get her platelet count rechecked at 5 AM and if platelet count is less  than 50,000, she would get further platelet transfusion in anticipation for surgery  Chronic Sinusitis -C/w Loratidine 10 mg po Daily -S/p Multiple Courses of Abx Therapy -Dr. Alvy Bimler coordinating with Dr.  Redmond Baseman in ENT about possible Endoscopy and Biopsy; Patient scheduled for Wednesday 1/`0/18 at [facility] -Cardiology Clearance done -Transfer the patient over to [facility] for Procedure pending Bed Availability in anticipation of Surgery 02/20/16.  Fever with Dyspnea/cough likely from Rhinovirus, improved -Patient Afebrile today -Influenza A via PCR negative -Respiratory Viral Panel Positive for Rhinovirus/Enterovirus -CXR showed Mild chronic bronchitic change. No pneumonia, CHF, nor other acute cardiopulmonary abnormality. Thoracic aortic atherosclerosis. -UA was Negative -Recent C. Difficile Colitis from Abx use -Holding off on Abx at this point as patient has no Leukocytosis and with recent Hx of C. Diff and multiple Abx causing Thromobocytopenia because of Bone Marrow destruction -Continue to Monitor very closely  Acute blood loss anemia secondary to Epistaxis vs. Cancer s/p transfusion of 2 units of pRBC -Baseline hemoglobin 10-11 -Presenting hemoglobin 9.1 and now currently 10.3 after transfusion -Per Dr. Alvy Bimler Recommend transfusion as needed to keep Hb above 8 -Anemia Studies ordered by Oncology including CBC, Ferritin, Iron and TIBC, Reticulocytes, Vitamin B12 -Iron level was 28, UIBC was 228, TIBC was 256, Ferritin was 1,030, B12 was 613 -Type and Screened and is s/p 2 units of blood -Repeat CBC in AM  Chronic systolic and diastolic CHF -56/31/4970 echo EF 45-50%, grade 2 DD -She is euvolemic clinically -Daily weights -C/w Furosemide 20 mg po qDaily -Losartan on hold secondary to soft blood pressure -C/w Metoprolol 12.5 mg po BID -Cardiology Dr. Marlou Porch evaluated and appreciate Recc's and Preoperative Clearance  Paroxysmal Atrial Fibrillation -Not a candidate for anticoagulation secondary to her pancytopenia -Presently in sinus rhythm -CHADSVASc = 6 -Continue to Monitor HR on Telemetry   Hypertension -Hold Losartan and Continue Metoprolol Tartrate at a lower dose  as discussed above  Hyperlipidemia -C/w Simvastatin 40 mg po Daily  Follicular B-cell lymphoma -Recent PET scan shows the patient has no definitive signs of cancer but there is hypermetabolic activity noted around bone marrow area -Follow up with Dr. Alvy Bimler -Appreciate Dr. Calton Dach evaluation and Recc's  Hematemesis/Hemoptysis/Melena/BRBPR -Likely resulting from blood from her epistaxis and not true hematemesis or hemoptysis as these have occurred only in the setting of her Epistaxis; Melena likely from swallowed Blood -Patient's Hb/Hct went from 9.1/27.5 -> 8.4/24.3 -> 7.8/22.5 -> 9.7/27.8 -> 10.6/30.3 -> 10.3/30.3 -S/p Transfusion 2 units of Irradiated pRBC's -Discussed with [facility] Dr. Carlean Purl for possible GI Bleed however he states it is likely from swallowed Blood from Epistaxis -FOBT Positive earlier on admission; Attributes bright red blood from hemorrohids  -REPEAT FOBT NEGATIVE 02/17/16 -Will continue to monitor CBC and for S/Sx of Bleeding -C/w Patient on IV Protonix 40 mg BID  Lower Extremity Pain and Edema -Venous duplex done and preliminary findings show no evidence of DVT or Bakers Cyst in the Lower Extremities   Asthma -Stable clinically -Continue Breo Ellipta 1 puff Inhalation and Albuterol 2.5 Neb q6hprn  Neuropathy secondary to chemotherapy -Continue Gabapentin  Hypothyroidism -Continue Synthroid 112 mcg  Impaired Glucose Tolerance -10/24/2015 hemoglobin A1c 6.4 -Will not start sliding scale -Continue to Monitor BS  DVT prophylaxis: SCD's because of Thrombocytopenia Code Status: FULL CODE Family Communication: Discussed with Husband at bedside Disposition Plan: Remain Inpatient and Transfer to [facility] for ENT Procedure scheduled for Wednesday 02/20/16  Consultants:   ENT Dr. Redmond Baseman  Oncology Dr. Alvy Bimler  Cardiology Dr.  Candee Furbish  Gastroenterology Dr. Carlean Purl via telephone Consultation.    Procedures: Nasal  Packing   Antimicrobials: None  Subjective: Seen and examined and stated she was feeling much better today. Had a bowel movement with no blood. No CP and sinuses felt a little better today. No concerns or complaints as she is going to be transferred to [facility] for ENT procedure in Am.    Objective: Vitals:   02/18/16 2228 02/19/16 0651 02/19/16 1000 02/19/16 1428  BP: 109/68 131/62 (!) 96/50 117/65  Pulse: 83 99 90 77  Resp: 16 16    Temp: 98.1 F (36.7 C) 99.2 F (37.3 C) 97.8 F (36.6 C) 97.5 F (36.4 C)  TempSrc: Oral Oral Oral Oral  SpO2: 93% 93% 95% 95%  Weight:      Height:        Intake/Output Summary (Last 24 hours) at 02/19/16 1512 Last data filed at 02/19/16 0930  Gross per 24 hour  Intake              480 ml  Output                0 ml  Net              480 ml   Filed Weights   02/16/16 0431 02/17/16 1057 02/18/16 1100  Weight: 98.4 kg (217 lb) 100.7 kg (222 lb) 100.2 kg (220 lb 14.4 oz)    Examination: Physical Exam:  Constitutional: WN/WD, NAD and appears calm and comfortable Eyes: Lids and conjunctivae normal, sclerae anicteric  ENMT: External Ears, Left Nare Packed. Grossly normal hearing.  Neck: Appears normal, supple, no cervical masses, normal ROM, no appreciable thyromegaly, no JVD Respiratory: Clear to auscultation bilaterally, no wheezing, rales, rhonchi or crackles. Normal respiratory effort and patient is not tachypenic. No accessory muscle use.  Cardiovascular: RRR, no murmurs / rubs / gallops. S1 and S2 auscultated. No Appreciable extremity edema. Abdomen: Soft, non-tender, non-distended. No masses palpated. No appreciable hepatosplenomegaly. Bowel sounds positive x4.  GU: Deferred. Musculoskeletal: No clubbing / cyanosis of digits/nails. No joint deformity upper and lower extremities. No contractures.  Skin: No rashes, lesions, ulcers. No induration; Warm and dry.  Neurologic: CN 2-12 grossly intact with no focal deficits. Sensation  intact in all 4 Extremities. Romberg sign cerebellar reflexes not assessed.  Psychiatric: Normal judgment and insight. Alert and oriented x 3. Normal mood and appropriate affect.   Data Reviewed: I have personally reviewed following labs and imaging studies  CBC:  Recent Labs Lab 02/14/16 1221 02/15/16 0540 02/16/16 0630 02/17/16 0530 02/18/16 0930 02/19/16 0720  WBC 7.8 6.7 7.4 7.9 5.9 6.1  NEUTROABS 1.0* 0.9* 0.9*  --  1.2* 1.1*  HGB 9.1* 8.4* 7.8* 9.7* 10.6* 10.3*  HCT 27.5* 24.3* 22.5* 27.8* 30.3* 30.3*  MCV 96.2 96.0 95.7 92.1 93.5 94.4  PLT 16* 67* 31* 34* 24* 23*   Basic Metabolic Panel:  Recent Labs Lab 02/15/16 0540 02/16/16 0630 02/17/16 0530 02/18/16 0930 02/19/16 0720  NA 139 134* 134* 131* 136  K 3.6 3.6 3.6 3.5 3.8  CL 103 101 102 100* 102  CO2 '28 27 26 25 26  '$ GLUCOSE 97 102* 110* 166* 101*  BUN '16 15 13 11 13  '$ CREATININE 0.52 0.64 0.58 0.67 0.51  CALCIUM 8.5* 8.2* 8.1* 8.1* 8.2*  MG  --  2.2 2.1 1.9 2.0  PHOS  --  4.1 2.6 2.6 2.5   GFR: Estimated Creatinine Clearance: 70.2 mL/min (by  C-G formula based on SCr of 0.51 mg/dL). Liver Function Tests:  Recent Labs Lab 02/15/16 0540 02/16/16 0630 02/17/16 0530 02/18/16 0930 02/19/16 0720  AST 26 28 35 32 31  ALT '16 15 18 18 18  '$ ALKPHOS 217* 193* 241* 249* 250*  BILITOT 0.9 0.7 1.4* 1.1 1.1  PROT 5.2* 6.6 6.3* 6.2* 6.3*  ALBUMIN 2.6* 2.3* 2.4* 2.4* 2.4*   No results for input(s): LIPASE, AMYLASE in the last 168 hours. No results for input(s): AMMONIA in the last 168 hours. Coagulation Profile:  Recent Labs Lab 02/14/16 1221  INR 1.13   Cardiac Enzymes: No results for input(s): CKTOTAL, CKMB, CKMBINDEX, TROPONINI in the last 168 hours. BNP (last 3 results) No results for input(s): PROBNP in the last 8760 hours. HbA1C: No results for input(s): HGBA1C in the last 72 hours. CBG: No results for input(s): GLUCAP in the last 168 hours. Lipid Profile: No results for input(s): CHOL, HDL,  LDLCALC, TRIG, CHOLHDL, LDLDIRECT in the last 72 hours. Thyroid Function Tests: No results for input(s): TSH, T4TOTAL, FREET4, T3FREE, THYROIDAB in the last 72 hours. Anemia Panel: No results for input(s): VITAMINB12, FOLATE, FERRITIN, TIBC, IRON, RETICCTPCT in the last 72 hours. Sepsis Labs: No results for input(s): PROCALCITON, LATICACIDVEN in the last 168 hours.  Recent Results (from the past 240 hour(s))  Respiratory Panel by PCR     Status: Abnormal   Collection Time: 02/14/16  3:43 PM  Result Value Ref Range Status   Adenovirus NOT DETECTED NOT DETECTED Final   Coronavirus 229E NOT DETECTED NOT DETECTED Final   Coronavirus HKU1 NOT DETECTED NOT DETECTED Final   Coronavirus NL63 NOT DETECTED NOT DETECTED Final   Coronavirus OC43 NOT DETECTED NOT DETECTED Final   Metapneumovirus NOT DETECTED NOT DETECTED Final   Rhinovirus / Enterovirus DETECTED (A) NOT DETECTED Final   Influenza A NOT DETECTED NOT DETECTED Final   Influenza B NOT DETECTED NOT DETECTED Final   Parainfluenza Virus 1 NOT DETECTED NOT DETECTED Final   Parainfluenza Virus 2 NOT DETECTED NOT DETECTED Final   Parainfluenza Virus 3 NOT DETECTED NOT DETECTED Final   Parainfluenza Virus 4 NOT DETECTED NOT DETECTED Final   Respiratory Syncytial Virus NOT DETECTED NOT DETECTED Final   Bordetella pertussis NOT DETECTED NOT DETECTED Final   Chlamydophila pneumoniae NOT DETECTED NOT DETECTED Final   Mycoplasma pneumoniae NOT DETECTED NOT DETECTED Final    Comment: Performed at [facility]  Urine culture     Status: Abnormal   Collection Time: 02/14/16  3:43 PM  Result Value Ref Range Status   Specimen Description URINE, CLEAN CATCH  Final   Special Requests NONE  Final   Culture MULTIPLE SPECIES PRESENT, SUGGEST RECOLLECTION (A)  Final   Report Status 02/16/2016 FINAL  Final  Culture, blood (routine x 2)     Status: None (Preliminary result)   Collection Time: 02/15/16 10:09 PM  Result Value Ref Range Status    Specimen Description RIGHT ANTECUBITAL  Final   Special Requests BOTTLES DRAWN AEROBIC AND ANAEROBIC 8CC  Final   Culture   Final    NO GROWTH 2 DAYS Performed at [facility]    Report Status PENDING  Incomplete  Culture, blood (routine x 2)     Status: None (Preliminary result)   Collection Time: 02/15/16 10:09 PM  Result Value Ref Range Status   Specimen Description RIGHT ANTECUBITAL  Final   Special Requests IN PEDIATRIC BOTTLE [facility]  Final   Culture  Final    NO GROWTH 2 DAYS Performed at [facility]    Report Status PENDING  Incomplete    Radiology Studies: No results found. Scheduled Meds: . acidophilus  1 capsule Oral Daily  . acyclovir  400 mg Oral Daily  . calcium-vitamin D  1 tablet Oral QPM  . cholecalciferol  1,000 Units Oral Daily  . estradiol  1 mg Oral Daily  . FLUoxetine  20 mg Oral Daily  . fluticasone furoate-vilanterol  1 puff Inhalation QPM  . furosemide  20 mg Oral Daily  . gabapentin  300 mg Oral BID  . levothyroxine  112 mcg Oral QAC breakfast  . loratadine  10 mg Oral Daily  . metoprolol  12.5 mg Oral BID  . multivitamin with minerals  1 tablet Oral QPM  . pantoprazole (PROTONIX) IV  40 mg Intravenous Q12H  . simvastatin  40 mg Oral QHS   Continuous Infusions:   LOS: 4 days   Kerney Elbe, DO [facility] Pager [phone]  If 7PM-7AM, please contact night-coverage [email or web address] Password [email or web address] 02/19/2016, 3:12 PM

## 2016-02-19 NOTE — Care Management Important Message (Addendum)
Important Message  Patient Details IM Letter given to Nora/Case Manager to present to Patient Name: Diamond Boyd MRN: [id number] Date of Birth: [birth year]   Medicare Important Message Given:  Yes    Kerin Salen 02/19/2016, 1:36 PMImportant Message  Patient Details  Name: Diamond Boyd MRN: [id number] Date of Birth: 10-05-1940   Medicare Important Message Given:  Yes    Kerin Salen 02/19/2016, 1:35 PMImportant Message  Patient Details  Name: Diamond Boyd MRN: [id number] Date of Birth: April 14, 1940   Medicare Important Message Given:  Yes    Kerin Salen 02/19/2016, 1:34 PM

## 2016-02-20 ENCOUNTER — Encounter (HOSPITAL_COMMUNITY)
Admission: EM | Disposition: A | Discharge status: Home Health | Payer: Self-pay | Source: Home / Self Care | Attending: Internal Medicine

## 2016-02-20 ENCOUNTER — Inpatient Hospital Stay (HOSPITAL_COMMUNITY): Payer: Medicare Other | Admitting: Certified Registered Nurse Anesthetist

## 2016-02-20 ENCOUNTER — Encounter (HOSPITAL_COMMUNITY): Payer: Self-pay | Admitting: Surgery

## 2016-02-20 DIAGNOSIS — D696 Thrombocytopenia, unspecified: Secondary | ICD-10-CM

## 2016-02-20 DIAGNOSIS — K921 Melena: Secondary | ICD-10-CM

## 2016-02-20 DIAGNOSIS — J320 Chronic maxillary sinusitis: Secondary | ICD-10-CM | POA: Diagnosis not present

## 2016-02-20 DIAGNOSIS — R0602 Shortness of breath: Secondary | ICD-10-CM

## 2016-02-20 DIAGNOSIS — J322 Chronic ethmoidal sinusitis: Secondary | ICD-10-CM | POA: Diagnosis not present

## 2016-02-20 DIAGNOSIS — D61818 Other pancytopenia: Secondary | ICD-10-CM

## 2016-02-20 DIAGNOSIS — R040 Epistaxis: Principal | ICD-10-CM

## 2016-02-20 DIAGNOSIS — D801 Nonfamilial hypogammaglobulinemia: Secondary | ICD-10-CM

## 2016-02-20 DIAGNOSIS — J323 Chronic sphenoidal sinusitis: Secondary | ICD-10-CM | POA: Diagnosis not present

## 2016-02-20 HISTORY — PX: SINUS ENDO W/FUSION: SHX777

## 2016-02-20 LAB — COMPREHENSIVE METABOLIC PANEL
ALT: 19 U/L (ref 14–54)
ANION GAP: 8 (ref 5–15)
AST: 34 U/L (ref 15–41)
Albumin: 2.4 g/dL — ABNORMAL LOW (ref 3.5–5.0)
Alkaline Phosphatase: 305 U/L — ABNORMAL HIGH (ref 38–126)
BUN: 10 mg/dL (ref 6–20)
CALCIUM: 8.5 mg/dL — AB (ref 8.9–10.3)
CO2: 26 mmol/L (ref 22–32)
Chloride: 103 mmol/L (ref 101–111)
Creatinine, Ser: 0.85 mg/dL (ref 0.44–1.00)
GFR calc Af Amer: >60 mL/min (ref >60)
Glucose, Bld: 118 mg/dL — ABNORMAL HIGH (ref 65–99)
Potassium: 3.7 mmol/L (ref 3.5–5.1)
Sodium: 137 mmol/L (ref 135–145)
TOTAL PROTEIN: 6.7 g/dL (ref 6.5–8.1)
Total Bilirubin: 1.1 mg/dL (ref 0.3–1.2)

## 2016-02-20 LAB — CBC WITH DIFFERENTIAL/PLATELET
BASOS ABS: 0 10*3/uL (ref 0.0–0.1)
Basophils Relative: 0 %
Eosinophils Absolute: 0 10*3/uL (ref 0.0–0.7)
Eosinophils Relative: 0 %
HEMATOCRIT: 34.6 % — AB (ref 36.0–46.0)
HEMOGLOBIN: 11.7 g/dL — AB (ref 12.0–15.0)
LYMPHS PCT: 67 %
Lymphs Abs: 4.8 10*3/uL — ABNORMAL HIGH (ref 0.7–4.0)
MCH: 32.1 pg (ref 26.0–34.0)
MCHC: 33.8 g/dL (ref 30.0–36.0)
MCV: 94.8 fL (ref 78.0–100.0)
MONO ABS: 0.9 10*3/uL (ref 0.1–1.0)
Monocytes Relative: 12 %
NEUTROS ABS: 1.5 10*3/uL — AB (ref 1.7–7.7)
Neutrophils Relative %: 21 %
Platelets: 49 10*3/uL — ABNORMAL LOW (ref 150–400)
RBC: 3.65 MIL/uL — ABNORMAL LOW (ref 3.87–5.11)
RDW: 19.4 % — AB (ref 11.5–15.5)
WBC: 7.2 10*3/uL (ref 4.0–10.5)

## 2016-02-20 LAB — MAGNESIUM: MAGNESIUM: 2.1 mg/dL (ref 1.7–2.4)

## 2016-02-20 LAB — PLATELET COUNT: Platelets: 87 K/uL — ABNORMAL LOW (ref 150–400)

## 2016-02-20 LAB — PHOSPHORUS: Phosphorus: 3 mg/dL (ref 2.5–4.6)

## 2016-02-20 SURGERY — SINUS SURGERY, ENDOSCOPIC, USING COMPUTER-ASSISTED NAVIGATION
Anesthesia: General | Site: Nose | Laterality: Bilateral

## 2016-02-20 MED ORDER — LIDOCAINE-EPINEPHRINE 1 %-1:100000 IJ SOLN
INTRAMUSCULAR | Status: DC | PRN
  Administered 2016-02-20: 7 mL

## 2016-02-20 MED ORDER — 0.9 % SODIUM CHLORIDE (POUR BTL) OPTIME
TOPICAL | Status: DC | PRN
  Administered 2016-02-20: 1000 mL

## 2016-02-20 MED ORDER — MIDAZOLAM HCL 5 MG/5ML IJ SOLN
INTRAMUSCULAR | Status: DC | PRN
  Administered 2016-02-20: 1 mg via INTRAVENOUS

## 2016-02-20 MED ORDER — OXYCODONE HCL 5 MG PO TABS: 5.0000 mg | ORAL_TABLET | Freq: Once | ORAL | Status: DC | PRN

## 2016-02-20 MED ORDER — OXYMETAZOLINE HCL 0.05 % NA SOLN
2.0000 | Freq: Two times a day (BID) | NASAL | Status: DC | PRN
  Filled 2016-02-20: qty 15

## 2016-02-20 MED ORDER — LIDOCAINE HCL (CARDIAC) 20 MG/ML IV SOLN
INTRAVENOUS | Status: DC | PRN
  Administered 2016-02-20: 60 mg via INTRAVENOUS

## 2016-02-20 MED ORDER — SUCCINYLCHOLINE CHLORIDE 200 MG/10ML IV SOSY
PREFILLED_SYRINGE | INTRAVENOUS | Status: AC
  Filled 2016-02-20: qty 10

## 2016-02-20 MED ORDER — ARTIFICIAL TEARS OP OINT
TOPICAL_OINTMENT | OPHTHALMIC | Status: AC
  Filled 2016-02-20: qty 3.5

## 2016-02-20 MED ORDER — LIDOCAINE HCL (PF) 1 % IJ SOLN
INTRAMUSCULAR | Status: AC
  Filled 2016-02-20: qty 30

## 2016-02-20 MED ORDER — ARTIFICIAL TEARS OP OINT
TOPICAL_OINTMENT | OPHTHALMIC | Status: DC | PRN
  Administered 2016-02-20: 1 via OPHTHALMIC

## 2016-02-20 MED ORDER — LABETALOL HCL 5 MG/ML IV SOLN
INTRAVENOUS | Status: DC | PRN
  Administered 2016-02-20: 10 mg via INTRAVENOUS

## 2016-02-20 MED ORDER — MUPIROCIN 2 % EX OINT
TOPICAL_OINTMENT | CUTANEOUS | Status: DC | PRN
  Administered 2016-02-20: 1 via NASAL

## 2016-02-20 MED ORDER — SODIUM CHLORIDE 0.9 % IV SOLN
Freq: Once | INTRAVENOUS | Status: AC
  Administered 2016-02-20: 10:00:00 via INTRAVENOUS

## 2016-02-20 MED ORDER — PHENYLEPHRINE HCL 10 MG/ML IJ SOLN
INTRAVENOUS | Status: DC | PRN
  Administered 2016-02-20: 30 ug/min via INTRAVENOUS

## 2016-02-20 MED ORDER — OXYCODONE HCL 5 MG/5ML PO SOLN: 5.0000 mg | Freq: Once | ORAL | Status: DC | PRN

## 2016-02-20 MED ORDER — ONDANSETRON HCL 4 MG/2ML IJ SOLN
INTRAMUSCULAR | Status: AC
  Filled 2016-02-20: qty 2

## 2016-02-20 MED ORDER — PROPOFOL 10 MG/ML IV BOLUS
INTRAVENOUS | Status: AC
  Filled 2016-02-20: qty 20

## 2016-02-20 MED ORDER — EPINEPHRINE PF 1 MG/ML IJ SOLN
INTRAMUSCULAR | Status: AC
  Filled 2016-02-20: qty 1

## 2016-02-20 MED ORDER — FENTANYL CITRATE (PF) 100 MCG/2ML IJ SOLN
INTRAMUSCULAR | Status: DC | PRN
  Administered 2016-02-20 (×3): 50 ug via INTRAVENOUS

## 2016-02-20 MED ORDER — HYDROMORPHONE HCL 2 MG/ML IJ SOLN
INTRAMUSCULAR | Status: AC
  Administered 2016-02-20: 0.5 mg
  Filled 2016-02-20: qty 1

## 2016-02-20 MED ORDER — SUCCINYLCHOLINE 20MG/ML (10ML) SYRINGE FOR MEDFUSION PUMP - OPTIME
INTRAMUSCULAR | Status: DC | PRN
  Administered 2016-02-20: 100 mg via INTRAVENOUS

## 2016-02-20 MED ORDER — OXYMETAZOLINE HCL 0.05 % NA SOLN
NASAL | Status: DC | PRN
  Administered 2016-02-20: 1

## 2016-02-20 MED ORDER — ONDANSETRON HCL 4 MG/2ML IJ SOLN: 4.0000 mg | Freq: Four times a day (QID) | INTRAMUSCULAR | Status: DC | PRN

## 2016-02-20 MED ORDER — FENTANYL CITRATE (PF) 100 MCG/2ML IJ SOLN
INTRAMUSCULAR | Status: AC
  Filled 2016-02-20: qty 4

## 2016-02-20 MED ORDER — LACTATED RINGERS IV SOLN
INTRAVENOUS | Status: DC | PRN
  Administered 2016-02-20: 13:00:00 via INTRAVENOUS

## 2016-02-20 MED ORDER — MIDAZOLAM HCL 2 MG/2ML IJ SOLN
INTRAMUSCULAR | Status: AC
  Filled 2016-02-20: qty 2

## 2016-02-20 MED ORDER — ALBUMIN HUMAN 5 % IV SOLN
INTRAVENOUS | Status: DC | PRN
  Administered 2016-02-20: 13:00:00 via INTRAVENOUS

## 2016-02-20 MED ORDER — MUPIROCIN 2 % EX OINT
TOPICAL_OINTMENT | CUTANEOUS | Status: AC
Start: 2016-02-20 — End: 2016-02-20
  Filled 2016-02-20: qty 22

## 2016-02-20 MED ORDER — LIDOCAINE 2% (20 MG/ML) 5 ML SYRINGE
INTRAMUSCULAR | Status: AC
  Filled 2016-02-20: qty 5

## 2016-02-20 MED ORDER — HYDROMORPHONE HCL 1 MG/ML IJ SOLN
0.2500 mg | INTRAMUSCULAR | Status: DC | PRN
  Administered 2016-02-20: 0.5 mg via INTRAVENOUS

## 2016-02-20 MED ORDER — SODIUM CHLORIDE 0.9% FLUSH
10.0000 mL | INTRAVENOUS | Status: DC | PRN
  Administered 2016-02-22: 10 mL
  Filled 2016-02-20: qty 40

## 2016-02-20 MED ORDER — SALINE SPRAY 0.65 % NA SOLN
2.0000 | NASAL | Status: DC
  Administered 2016-02-20 – 2016-02-22 (×14): 2 via NASAL
  Filled 2016-02-20: qty 44

## 2016-02-20 MED ORDER — OXYMETAZOLINE HCL 0.05 % NA SOLN
NASAL | Status: AC
  Filled 2016-02-20: qty 15

## 2016-02-20 MED ORDER — SODIUM CHLORIDE 0.9 % IR SOLN
Status: DC | PRN
  Administered 2016-02-20: 2000 mL

## 2016-02-20 MED ORDER — PROPOFOL 10 MG/ML IV BOLUS
INTRAVENOUS | Status: DC | PRN
  Administered 2016-02-20: 150 mg via INTRAVENOUS

## 2016-02-20 SURGICAL SUPPLY — 49 items
BLADE RAD 40 CVD SINUS 4MM (BLADE) IMPLANT
BLADE RAD40 ROTATE 4M 4 5PK (BLADE) IMPLANT
BLADE RAD40 ROTATE 4M 4MM 5PK (BLADE)
BLADE RAD60 ROTATE M4 4 5PK (BLADE) IMPLANT
BLADE RAD60 ROTATE M4 4MM 5PK (BLADE)
BLADE ROTATE TRICUT 4MX13CM M4 (BLADE) ×1
BLADE ROTATE TRICUT 4X13 M4 (BLADE) ×2 IMPLANT
BLADE TRICUT ROTATE M4 4 5PK (BLADE) ×2 IMPLANT
BLADE TRICUT ROTATE M4 4MM 5PK (BLADE) ×1
CANISTER SUCTION 2500CC (MISCELLANEOUS) ×3 IMPLANT
CLOSURE STERI-STRIP 1/2X4 (GAUZE/BANDAGES/DRESSINGS) ×1
CLSR STERI-STRIP ANTIMIC 1/2X4 (GAUZE/BANDAGES/DRESSINGS) ×2 IMPLANT
COAGULATOR SUCT 6 FR SWTCH (ELECTROSURGICAL) ×1
COAGULATOR SUCT SWTCH 10FR 6 (ELECTROSURGICAL) ×2 IMPLANT
CRADLE DONUT ADULT HEAD (MISCELLANEOUS) IMPLANT
DRAPE PROXIMA HALF (DRAPES) IMPLANT
DRESSING NASAL POPE 10X1.5X2.5 (GAUZE/BANDAGES/DRESSINGS) IMPLANT
DRESSING TELFA 8X10 (GAUZE/BANDAGES/DRESSINGS) IMPLANT
DRSG NASAL POPE 10X1.5X2.5 (GAUZE/BANDAGES/DRESSINGS)
DRSG NASOPORE 8CM (GAUZE/BANDAGES/DRESSINGS) ×3 IMPLANT
ELECT REM PT RETURN 9FT ADLT (ELECTROSURGICAL)
ELECTRODE REM PT RTRN 9FT ADLT (ELECTROSURGICAL) IMPLANT
GLOVE BIO SURGEON STRL SZ7.5 (GLOVE) ×3 IMPLANT
GOWN STRL REUS W/ TWL LRG LVL3 (GOWN DISPOSABLE) ×2 IMPLANT
GOWN STRL REUS W/TWL LRG LVL3 (GOWN DISPOSABLE) ×6
KIT BASIN OR (CUSTOM PROCEDURE TRAY) ×3 IMPLANT
KIT ROOM TURNOVER OR (KITS) ×3 IMPLANT
NDL HYPO 25GX1X1/2 BEV (NEEDLE) IMPLANT
NEEDLE HYPO 25GX1X1/2 BEV (NEEDLE) ×3 IMPLANT
NEEDLE PRECISIONGLIDE 27X1.5 (NEEDLE) ×3 IMPLANT
NEEDLE SPNL 22GX3.5 QUINCKE BK (NEEDLE) ×3 IMPLANT
NS IRRIG 1000ML POUR BTL (IV SOLUTION) ×3 IMPLANT
PAD ARMBOARD 7.5X6 YLW CONV (MISCELLANEOUS) ×6 IMPLANT
PAD ENT ADHESIVE 25PK (MISCELLANEOUS) ×3 IMPLANT
PATTIES SURGICAL .5 X3 (DISPOSABLE) ×3 IMPLANT
SHEATH ENDOSCRUB 0 DEG (SHEATH) ×3 IMPLANT
SHEATH ENDOSCRUB 30 DEG (SHEATH) ×3 IMPLANT
SHEATH ENDOSCRUB 45 DEG (SHEATH) ×2 IMPLANT
SOLUTION ANTI FOG 6CC (MISCELLANEOUS) ×3 IMPLANT
SUT ETHILON 3 0 PS 1 (SUTURE) IMPLANT
SWAB COLLECTION DEVICE MRSA (MISCELLANEOUS) ×2 IMPLANT
SYR 50ML SLIP (SYRINGE) IMPLANT
TOWEL OR 17X24 6PK STRL BLUE (TOWEL DISPOSABLE) ×3 IMPLANT
TRACKER ENT INSTRUMENT (MISCELLANEOUS) ×3 IMPLANT
TRACKER ENT PATIENT (MISCELLANEOUS) ×3 IMPLANT
TRAY ENT MC OR (CUSTOM PROCEDURE TRAY) ×3 IMPLANT
TUBE CONNECTING 12'X1/4 (SUCTIONS) ×1
TUBE CONNECTING 12X1/4 (SUCTIONS) ×2 IMPLANT
TUBING STRAIGHTSHOT EPS 5PK (TUBING) IMPLANT

## 2016-02-20 NOTE — Transfer of Care (Signed)
Immediate Anesthesia Transfer of Care Note  Patient: ALIE MOUDY  Procedure(s) Performed: Procedure(s): BILATERAL MAXILLARY ANTROSTOMY, BILATERAL ANTERIOR ETHMOIDECTOMY, BILATERAL FRONTAL RECESS EXPLORATION, FUSION IMAGE GUIDANCE (Bilateral)  Patient Location: PACU  Anesthesia Type:General  Level of Consciousness: awake, alert , oriented and patient cooperative  Airway & Oxygen Therapy: Patient Spontanous Breathing and Patient connected to face mask oxygen  Post-op Assessment: Report given to RN, Post -op Vital signs reviewed and stable and Patient moving all extremities  Post vital signs: Reviewed and stable  Last Vitals:  Vitals:   02/20/16 0931 02/20/16 1145  BP: 100/61   Pulse: 93   Resp: 18   Temp: 36.7 C 36.7 C    Last Pain:  Vitals:   02/20/16 0931  TempSrc: Oral  PainSc:       Patients Stated Pain Goal: 2 (01/65/53 7482)  Complications: No apparent anesthesia complications

## 2016-02-20 NOTE — Progress Notes (Signed)
Repeat CBC showed platelet <50,000 Spoke with Dr. Rockne Menghini. She has ordered another unit of platelets in anticipation for surgery

## 2016-02-20 NOTE — Anesthesia Preprocedure Evaluation (Signed)
Anesthesia Evaluation  Patient identified by MRN, date of birth, ID band Patient awake    Reviewed: Allergy & Precautions, H&P , NPO status , Patient's Chart, lab work & pertinent test results  Airway Mallampati: II   Neck ROM: full    Dental   Pulmonary shortness of breath, asthma ,    breath sounds clear to auscultation       Cardiovascular hypertension, +CHF   Rhythm:regular Rate:Normal  TTE (03/2015): EF 45-50%   Neuro/Psych PSYCHIATRIC DISORDERS Anxiety Depression  Neuromuscular disease    GI/Hepatic GERD  ,  Endo/Other  Hypothyroidism   Renal/GU      Musculoskeletal  (+) Arthritis ,   Abdominal   Peds  Hematology  (+) Blood dyscrasia, , PLTS 49   Anesthesia Other Findings   Reproductive/Obstetrics                             Anesthesia Physical Anesthesia Plan  ASA: III  Anesthesia Plan: General   Post-op Pain Management:    Induction: Intravenous  Airway Management Planned: Oral ETT  Additional Equipment:   Intra-op Plan:   Post-operative Plan: Extubation in OR  Informed Consent: I have reviewed the patients History and Physical, chart, labs and discussed the procedure including the risks, benefits and alternatives for the proposed anesthesia with the patient or authorized representative who has indicated his/her understanding and acceptance.     Plan Discussed with: CRNA, Anesthesiologist and Surgeon  Anesthesia Plan Comments:         Anesthesia Quick Evaluation

## 2016-02-20 NOTE — Progress Notes (Signed)
Diamond Boyd   DOB:28-Sep-1940   ZG#:[id number]    Subjective: She has been transferred to [facility] last night. She received platelet transfusion this morning. She has been complaining of fever and chills this morning after platelets. She has intermittent nosebleed. She had some cough but nonproductive  Objective:  Vitals:   02/20/16 0500 02/20/16 0625  BP: (!) 141/56 (!) 133/52  Pulse: 97   Resp: 20 18  Temp: (!) 100.6 F (38.1 C) (!) 100.8 F (38.2 C)     Intake/Output Summary (Last 24 hours) at 02/20/16 0750 Last data filed at 02/20/16 0330  Gross per 24 hour  Intake              540 ml  Output                0 ml  Net              540 ml    GENERAL:alert, no distress and comfortable SKIN: skin color, texture, turgor are normal, no rashes or significant lesions EYES: normal, Conjunctiva are pink and non-injected, sclera clear Musculoskeletal:no cyanosis of digits and no clubbing  NEURO: alert & oriented x 3 with fluent speech, no focal motor/sensory deficits   Labs:  Lab Results  Component Value Date   WBC 6.1 02/19/2016   HGB 10.3 (L) 02/19/2016   HCT 30.3 (L) 02/19/2016   MCV 94.4 02/19/2016   PLT 23 (LL) 02/19/2016   NEUTROABS 1.1 (L) 02/19/2016    Lab Results  Component Value Date   NA 136 02/19/2016   K 3.8 02/19/2016   CL 102 02/19/2016   CO2 26 02/19/2016    Assessment & Plan:   History of non-Hodgkin lymphoma Recent PET CT scan showed no definitive signs of cancer recurrence although there is hypermetabolic activity noted around her bone marrow area She is currently not on treatment  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, 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 Recommend transfusion as needed to keep hemoglobin above 8 g She received 2 units of blood on 02/16/2016 She will need irradiated blood products  Recurrent nosebleed Severe thrombocytopenia She had received platelet transfusion on 02/14/2016 & 02/20/16 Coagulation study came back  abnormal Mixing studies confirmed acquired coagulopathy She will need intermittent platelet transfusion to keep platelet count greater than 10,000  Chronic recurrent sinusitis, fever This has been a major issue as a cause recurrent severe infection to the point that after multiple courses of antibiotic therapy, she developed recent Clostridium difficile infection I'm concerned about atypical infection such as fungal infection affecting her sinus I have spoken with the ENT many times to consider repeat evaluation with endoscopy and biopsy consultation Hopefully surgery today  I went to the lab this morning to inquired why a.m. CBC has not been drawn I have asked the lab to draw stat CBC now.  If platelet count is less than 50,000, we will give another unit of platelets  Type and screen has been drawn yesterday   Severe protein calorie malnutrition Recommend dietitian consult  DVT prophylaxis I do not recommend Lovenox due to recent bleeding and coagulopathy  Discharge planning She is too ill to be discharged [name], MD 02/20/2016  7:50 AM

## 2016-02-20 NOTE — Progress Notes (Signed)
Called into room - pt complaining of chills. Temperature orally 98. Given PRN Tylenol per pt request.   IV team paged to get stat lab for platelet level and lock off IV for transport to OR.   Fritz Pickerel, RN

## 2016-02-20 NOTE — Brief Op Note (Signed)
02/14/2016 - 02/20/2016  2:15 PM  PATIENT:  Diamond Boyd  76 y.o. female  PRE-OPERATIVE DIAGNOSIS:  Chronic sinusitis  POST-OPERATIVE DIAGNOSIS:  Chronic sinusitis  PROCEDURE:  Procedure(s): BILATERAL MAXILLARY ANTROSTOMY, BILATERAL ANTERIOR ETHMOIDECTOMY, BILATERAL FRONTAL RECESS EXPLORATION, FUSION IMAGE GUIDANCE (Bilateral)  SURGEON:  Surgeon(s) and Role:    * Melida Quitter, MD - Primary  PHYSICIAN ASSISTANT:   ASSISTANTS: none   ANESTHESIA:   general  EBL:  Total I/O In: 450 [Blood:200; IV Piggyback:250] Out: -   BLOOD ADMINISTERED:none  DRAINS: none   LOCAL MEDICATIONS USED:  LIDOCAINE   SPECIMEN:  Source of Specimen:  Sinus contents  DISPOSITION OF SPECIMEN:  PATHOLOGY  COUNTS:  YES  TOURNIQUET:  * No tourniquets in log *  DICTATION: .Other Dictation: Dictation Number [id number]  PLAN OF CARE: Return to inpatient room  PATIENT DISPOSITION:  PACU - hemodynamically stable.   Delay start of Pharmacological VTE agent (>24hrs) due to surgical blood loss or risk of bleeding: yes

## 2016-02-20 NOTE — Anesthesia Procedure Notes (Signed)
Procedure Name: Intubation Date/Time: 02/20/2016 1:04 PM Performed by: Greggory Stallion, Joshuajames Moehring L Pre-anesthesia Checklist: Patient identified, Emergency Drugs available, Suction available, Patient being monitored and Timeout performed Patient Re-evaluated:Patient Re-evaluated prior to inductionOxygen Delivery Method: Circle System Utilized Preoxygenation: Pre-oxygenation with 100% oxygen Intubation Type: IV induction, Cricoid Pressure applied and Rapid sequence Ventilation: Mask ventilation without difficulty Grade View: Grade I Tube type: Oral Tube size: 7.0 mm Number of attempts: 1 Airway Equipment and Method: Stylet and Oral airway Placement Confirmation: ETT inserted through vocal cords under direct vision,  positive ETCO2 and breath sounds checked- equal and bilateral Secured at: 22 cm Tube secured with: Tape Dental Injury: Teeth and Oropharynx as per pre-operative assessment

## 2016-02-20 NOTE — Progress Notes (Signed)
Progress Note    Diamond Boyd  XFG:[id number] DOB: 03/04/1940  DOA: 02/14/2016 PCP: [name], MD    Brief Narrative:   Chief complaint: Follow-up epistaxis  Diamond Boyd is an 76 y.o. female a PMH ofcolon cancer, Hodgkin's lymphoma, follicular B cell lymphoma in remission who was admitted 02/14/16 with a two-week history of intermittent epistaxis. In addition, the patient reported a 2 day history of intermittent fever ranging from 101.72F and 103F. In the emergency department, the patient had a low-grade temperature of 99.72F but was hemodynamically stable saturating 96% on room air. The patient was given Afrin which helped stop her epistaxis and had left anterior and posterior nasal packing done. BMP showed a sodium 133 otherwise was unremarkable. CBC showed hemoglobin 9.1 with platelets 16,000. Hematology/Oncology, ENT, and Cardiology were consulted.  Assessment/Plan:   Principal problem:  Recurrent Epistaxis with worsening Thrombocytopenia, Epistaxis improved ENT consulted. Patient is s/p Left Anterior and Posterior Nasal Packing done by Dr. Redmond Baseman; Likely to be removed during surgical intervention scheduled for today. The patient follows Dr. Freeman Caldron pancytopenia, who has been following the patient, and who gave her an IVIG treatment. Coagulation studies are abnormal and mixing study confirmed acquired coagulopathy. Continue platelet transfusion to keep platelets greater than 10,000. She has received 3 units of platelets total, 2 units 02/14/16 and another unit this morning. Counseled on avoiding digital manipulation of her nares.  Active problems  Chronic Sinusitis: Continue Loratidine 10 mg po Daily. She is status post multiple courses of antibiotics. Dr. Redmond Baseman plans sinus endoscopy and biopsy. Cardiology Clearance done.  Fever with Dyspnea/cough likely from Rhinovirus, improved Respiratory Viral Panel Positive for Rhinovirus/Enterovirus. CXR showed Mild chronic  bronchitic change. No pneumonia, CHF, nor other acute cardiopulmonary abnormality. No evidence of UTI or recurrent C. difficile. Treat symptomatically.  Acute blood loss anemia secondary to Epistaxis vs. Cancer s/p transfusion of 2 units of pRBC Baseline hemoglobin 10-11. Transfuse to keep hemoglobin above 8 per oncology recommendations. Iron level 28, UIBC  228, TIBC  256, Ferritin  1,030, B12  613. Hemoglobin stable at 10.3.  Chronic systolic and diastolic CHF Echo done 96/78/9381 showed EF 01-75%, grade 2 diastolic dysfunction. Continue current medications including Lasix 20 mg daily and metoprolol 12.5 mg twice a day. Losartan currently on hold.  Paroxysmal Atrial Fibrillation Not a candidate for anticoagulation secondary to her pancytopenia. CHADSVASc = 6. His been maintaining sinus rhythm.  Hypertension Hold Losartan and Continue Metoprolol Tartrate at a lower dose as discussed above.  Hyperlipidemia Continue Simvastatin 40 mg po Daily.  Follicular B-cell lymphoma Recent PET scan shows the patient has no definitive signs of cancer but there is hypermetabolic activity noted around bone marrow area. Oncology following.  Hematemesis/Hemoptysis/Melena/BRBPR Felt to be from swallowed blood at this point. Repeat fecal occult blood testing was negative.  Lower Extremity Pain and Edema Venous duplex done and preliminary findings show no evidence of DVT or Bakers Cyst in the Lower Extremities.   Asthma Stable clinically. Continue BreoEllipta 1 puff Inhalation and Albuterol 2.5 Neb q 6 hours as needed.  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  Hypothyroidism Continue Synthroid 112 mcg.  Impaired Glucose Tolerance 10/24/2015 hemoglobin A1c 6.4.   Family Communication/Anticipated D/C date and plan/Code Status   DVT prophylaxis: SCDs ordered. Code Status: Full Code.  Family Communication: Sister updated at bedside. Disposition Plan: Home when  stable.   Medical Consultants:    Oncology  Cardiology  ENT   Procedures:  None  Anti-Infectives:    None  Subjective:   The patient reports that she has had non-productive cough.  Reports a left sided headache and nausea.  Has had decreased appetite.   Objective:    Vitals:   02/20/16 0336 02/20/16 0500 02/20/16 0625 02/20/16 0700  BP: 136/71 (!) 141/56 (!) 133/52   Pulse: 93 97    Resp: '18 20 18   '$ Temp: 98.3 F (36.8 C) (!) 100.6 F (38.1 C) (!) 100.8 F (38.2 C)   TempSrc: Oral Oral    SpO2: 93% 93%    Weight:    102.3 kg (225 lb 8.5 oz)  Height:        Intake/Output Summary (Last 24 hours) at 02/20/16 0805 Last data filed at 02/20/16 0330  Gross per 24 hour  Intake              540 ml  Output                0 ml  Net              540 ml   Filed Weights   02/19/16 1628 02/19/16 1715 02/20/16 0700  Weight: 98.7 kg (217 lb 11.2 oz) 99.3 kg (218 lb 14.7 oz) 102.3 kg (225 lb 8.5 oz)    Exam: General exam: Appears calm and comfortable. Obese. Respiratory system: Clear to auscultation. Respiratory effort normal. Cardiovascular system: S1 & S2 heard, RRR. No JVD,  rubs, gallops or clicks. No murmurs. Gastrointestinal system: Abdomen is nondistended, soft and nontender. No organomegaly or masses felt. Normal bowel sounds heard. Central nervous system: Alert and oriented. No focal neurological deficits. Extremities: No clubbing,  or cyanosis. Trace edema. Skin: No rashes, lesions or ulcers. Pale with left hand ecchymosis. Psychiatry: Judgement and insight appear normal. Mood & affect appropriate.   Data Reviewed:   I have personally reviewed following labs and imaging studies:  Labs: Basic Metabolic Panel:  Recent Labs Lab 02/15/16 0540 02/16/16 0630 02/17/16 0530 02/18/16 0930 02/19/16 0720  NA 139 134* 134* 131* 136  K 3.6 3.6 3.6 3.5 3.8  CL 103 101 102 100* 102  CO2 '28 27 26 25 26  '$ GLUCOSE 97 102* 110* 166* 101*  BUN '16 15 13 11 13   '$ CREATININE 0.52 0.64 0.58 0.67 0.51  CALCIUM 8.5* 8.2* 8.1* 8.1* 8.2*  MG  --  2.2 2.1 1.9 2.0  PHOS  --  4.1 2.6 2.6 2.5   GFR Estimated Creatinine Clearance: 70.9 mL/min (by C-G formula based on SCr of 0.51 mg/dL). Liver Function Tests:  Recent Labs Lab 02/15/16 0540 02/16/16 0630 02/17/16 0530 02/18/16 0930 02/19/16 0720  AST 26 28 35 32 31  ALT '16 15 18 18 18  '$ ALKPHOS 217* 193* 241* 249* 250*  BILITOT 0.9 0.7 1.4* 1.1 1.1  PROT 5.2* 6.6 6.3* 6.2* 6.3*  ALBUMIN 2.6* 2.3* 2.4* 2.4* 2.4*   No results for input(s): LIPASE, AMYLASE in the last 168 hours. No results for input(s): AMMONIA in the last 168 hours. Coagulation profile  Recent Labs Lab 02/14/16 1221  INR 1.13    CBC:  Recent Labs Lab 02/14/16 1221 02/15/16 0540 02/16/16 0630 02/17/16 0530 02/18/16 0930 02/19/16 0720  WBC 7.8 6.7 7.4 7.9 5.9 6.1  NEUTROABS 1.0* 0.9* 0.9*  --  1.2* 1.1*  HGB 9.1* 8.4* 7.8* 9.7* 10.6* 10.3*  HCT 27.5* 24.3* 22.5* 27.8* 30.3* 30.3*  MCV 96.2 96.0 95.7 92.1 93.5 94.4  PLT 16* 67* 31*  34* 24* 23*   Cardiac Enzymes: No results for input(s): CKTOTAL, CKMB, CKMBINDEX, TROPONINI in the last 168 hours. BNP (last 3 results) No results for input(s): PROBNP in the last 8760 hours. CBG: No results for input(s): GLUCAP in the last 168 hours. D-Dimer: No results for input(s): DDIMER in the last 72 hours. Hgb A1c: No results for input(s): HGBA1C in the last 72 hours. Lipid Profile: No results for input(s): CHOL, HDL, LDLCALC, TRIG, CHOLHDL, LDLDIRECT in the last 72 hours. Thyroid function studies: No results for input(s): TSH, T4TOTAL, T3FREE, THYROIDAB in the last 72 hours.  Invalid input(s): FREET3 Anemia work up: No results for input(s): VITAMINB12, FOLATE, FERRITIN, TIBC, IRON, RETICCTPCT in the last 72 hours. Sepsis Labs:  Recent Labs Lab 02/16/16 0630 02/17/16 0530 02/18/16 0930 02/19/16 0720  WBC 7.4 7.9 5.9 6.1    Microbiology Recent Results (from the  past 240 hour(s))  Respiratory Panel by PCR     Status: Abnormal   Collection Time: 02/14/16  3:43 PM  Result Value Ref Range Status   Adenovirus NOT DETECTED NOT DETECTED Final   Coronavirus 229E NOT DETECTED NOT DETECTED Final   Coronavirus HKU1 NOT DETECTED NOT DETECTED Final   Coronavirus NL63 NOT DETECTED NOT DETECTED Final   Coronavirus OC43 NOT DETECTED NOT DETECTED Final   Metapneumovirus NOT DETECTED NOT DETECTED Final   Rhinovirus / Enterovirus DETECTED (A) NOT DETECTED Final   Influenza A NOT DETECTED NOT DETECTED Final   Influenza B NOT DETECTED NOT DETECTED Final   Parainfluenza Virus 1 NOT DETECTED NOT DETECTED Final   Parainfluenza Virus 2 NOT DETECTED NOT DETECTED Final   Parainfluenza Virus 3 NOT DETECTED NOT DETECTED Final   Parainfluenza Virus 4 NOT DETECTED NOT DETECTED Final   Respiratory Syncytial Virus NOT DETECTED NOT DETECTED Final   Bordetella pertussis NOT DETECTED NOT DETECTED Final   Chlamydophila pneumoniae NOT DETECTED NOT DETECTED Final   Mycoplasma pneumoniae NOT DETECTED NOT DETECTED Final    Comment: Performed at [facility]  Urine culture     Status: Abnormal   Collection Time: 02/14/16  3:43 PM  Result Value Ref Range Status   Specimen Description URINE, CLEAN CATCH  Final   Special Requests NONE  Final   Culture MULTIPLE SPECIES PRESENT, SUGGEST RECOLLECTION (A)  Final   Report Status 02/16/2016 FINAL  Final  Culture, blood (routine x 2)     Status: None (Preliminary result)   Collection Time: 02/15/16 10:09 PM  Result Value Ref Range Status   Specimen Description RIGHT ANTECUBITAL  Final   Special Requests BOTTLES DRAWN AEROBIC AND ANAEROBIC 8CC  Final   Culture   Final    NO GROWTH 3 DAYS Performed at [facility]    Report Status PENDING  Incomplete  Culture, blood (routine x 2)     Status: None (Preliminary result)   Collection Time: 02/15/16 10:09 PM  Result Value Ref Range Status   Specimen Description RIGHT  ANTECUBITAL  Final   Special Requests IN PEDIATRIC BOTTLE 3CC  Final   Culture   Final    NO GROWTH 3 DAYS Performed at [facility]    Report Status PENDING  Incomplete    Radiology: No results found.  Medications:   . sodium chloride   Intravenous Once  . acetaminophen  650 mg Oral Once  . acidophilus  1 capsule Oral Daily  . acyclovir  400 mg Oral Daily  . calcium-vitamin D  1 tablet Oral QPM  .  cholecalciferol  1,000 Units Oral Daily  . estradiol  1 mg Oral Daily  . FLUoxetine  20 mg Oral Daily  . fluticasone furoate-vilanterol  1 puff Inhalation QPM  . furosemide  20 mg Oral Daily  . gabapentin  300 mg Oral BID  . levothyroxine  112 mcg Oral QAC breakfast  . loratadine  10 mg Oral Daily  . metoprolol  12.5 mg Oral BID  . multivitamin with minerals  1 tablet Oral QPM  . pantoprazole (PROTONIX) IV  40 mg Intravenous Q12H  . simvastatin  40 mg Oral QHS   Continuous Infusions:  Medical decision making is of high complexity and this patient is at high risk of deterioration, therefore this is a level 3 visit.  (> 4 problem points, 2 data points, high risk)   Problems/DDx Points   Self limited or minor (max 2)       1   Established problem, stable       1   Established problem, worsening       2   New problem, no additional W/U planned (max 1)       3   New problem, additional W/U planned        4    Data Reviewed Points   Review/order clinical lab tests       1   Review/order x-rays       1   Review/order tests (Echo, EKG, PFTs, etc)       1   Discussion of test results w/ performing MD       1   Independent review of image, tracing or specimen       2   Decision to obtain old records       1   Review and summation of old records       2    Level of risk Presenting prob Diagnostics Management   Minimal 1 self limited/minor Labs CXR EKG/EEG U/A U/S Rest Gargles Bandages Dressings   Low 2 or more self limited/minor 1 stable chronic Acute  uncomplicated illness Tests (PFTS) Non-CV imaging Arterial labs Biopsies of skin OTC drugs Minor surgery-no risk PT OT IVF without additives    Moderate 1 or more chronic illnesses w/ mild exac, progression or S/E from tx 2 or more stable chronic illnesses Undiagnosed new problem w/ uncertain prognosis Acute complicated injury  Stress tests Endoscopies with no risk factors Deep needle or incisional bx CV imaging without risk LP Thoracentesis Paracentesis Minor surgery w/ risks Elective major surgery w/ no risk (open, percutaneous or endoscopic) Prescription drugs Therapeutic nucl med IVF with additives Closed tx of fracture/dislocation    High Severe exac of chronic illness Acute or chronic illness/injury may pose a threat to life or bodily function (ARF) Change in neuro status    CV imaging w/ contrast and risk Cardio electophysiologic tests Endoscopies w/ risk Discography Elective major surgery Emergency major surgery Parenteral controlled substances Drug therapy req monitoring for toxicity DNR/de-escalation of care    MDM Prob points Data points Risk   Straightforward    <1    <1    Min   Low complexity    2    2    Low   Moderate    3    3    Mod   High Complexity    4 or more    4 or more    High      LOS: 5 days  [facility] Pager [phone]. If unable to reach me by pager, please call my cell phone at [phone].  *Please refer to [email or web address], password [email or web address] to get updated schedule on who will round on this patient, as hospitalists switch teams weekly. If 7PM-7AM, please contact night-coverage at [email or web address], password [email or web address] for any overnight needs.  02/20/2016, 8:05 AM

## 2016-02-20 NOTE — Progress Notes (Signed)
Pt received from PACU. No drainage from nose. Pt VSS : BP 100/53 HR 73 RR 16 O2 90% on 3L nasal cannula. Pt denies pain, falling quickly to sleep. Pt call bell within reach. Will continue to monitor.   Fritz Pickerel, RN

## 2016-02-21 ENCOUNTER — Encounter (HOSPITAL_COMMUNITY): Payer: Self-pay | Admitting: Otolaryngology

## 2016-02-21 DIAGNOSIS — R262 Difficulty in walking, not elsewhere classified: Secondary | ICD-10-CM

## 2016-02-21 LAB — PREPARE PLATELET PHERESIS
BLOOD PRODUCT EXPIRATION DATE: 201801112359
Blood Product Expiration Date: 201801112359
ISSUE DATE / TIME: 201801100921
ISSUE DATE / TIME: 201801100118
UNIT TYPE AND RH: 5100
UNIT TYPE AND RH: 8400

## 2016-02-21 LAB — CBC WITH DIFFERENTIAL/PLATELET
BASOS PCT: 0 %
Basophils Absolute: 0 10*3/uL (ref 0.0–0.1)
Eosinophils Absolute: 0 10*3/uL (ref 0.0–0.7)
Eosinophils Relative: 0 %
HEMATOCRIT: 30.6 % — AB (ref 36.0–46.0)
HEMOGLOBIN: 9.9 g/dL — AB (ref 12.0–15.0)
LYMPHS ABS: 4.5 10*3/uL — AB (ref 0.7–4.0)
LYMPHS PCT: 59 %
MCH: 31.3 pg (ref 26.0–34.0)
MCHC: 32.4 g/dL (ref 30.0–36.0)
MCV: 96.8 fL (ref 78.0–100.0)
MONOS PCT: 19 %
Monocytes Absolute: 1.4 10*3/uL — ABNORMAL HIGH (ref 0.1–1.0)
NEUTROS ABS: 1.7 10*3/uL (ref 1.7–7.7)
NEUTROS PCT: 22 %
Platelets: 50 10*3/uL — ABNORMAL LOW (ref 150–400)
RBC: 3.16 MIL/uL — ABNORMAL LOW (ref 3.87–5.11)
RDW: 19.5 % — ABNORMAL HIGH (ref 11.5–15.5)
WBC: 7.6 10*3/uL (ref 4.0–10.5)

## 2016-02-21 LAB — CULTURE, BLOOD (ROUTINE X 2)
CULTURE: NO GROWTH
Culture: NO GROWTH

## 2016-02-21 LAB — BASIC METABOLIC PANEL
Anion gap: 7 (ref 5–15)
BUN: 11 mg/dL (ref 6–20)
CHLORIDE: 102 mmol/L (ref 101–111)
CO2: 25 mmol/L (ref 22–32)
CREATININE: 0.75 mg/dL (ref 0.44–1.00)
Calcium: 8 mg/dL — ABNORMAL LOW (ref 8.9–10.3)
GFR calc non Af Amer: >60 mL/min (ref >60)
Glucose, Bld: 107 mg/dL — ABNORMAL HIGH (ref 65–99)
Potassium: 3.6 mmol/L (ref 3.5–5.1)
Sodium: 134 mmol/L — ABNORMAL LOW (ref 135–145)

## 2016-02-21 MED ORDER — PANTOPRAZOLE SODIUM 40 MG PO TBEC
40.0000 mg | DELAYED_RELEASE_TABLET | Freq: Two times a day (BID) | ORAL | Status: DC
  Administered 2016-02-21 – 2016-02-22 (×3): 40 mg via ORAL
  Filled 2016-02-21 (×3): qty 1

## 2016-02-21 NOTE — Anesthesia Postprocedure Evaluation (Signed)
Anesthesia Post Note  Patient: JANAH MCCULLOH  Procedure(s) Performed: Procedure(s) (LRB): BILATERAL MAXILLARY ANTROSTOMY, BILATERAL ANTERIOR ETHMOIDECTOMY, BILATERAL FRONTAL RECESS EXPLORATION, FUSION IMAGE GUIDANCE (Bilateral)  Patient location during evaluation: PACU Anesthesia Type: General Level of consciousness: awake and alert and patient cooperative Pain management: pain level controlled Vital Signs Assessment: post-procedure vital signs reviewed and stable Respiratory status: spontaneous breathing and respiratory function stable Cardiovascular status: stable Anesthetic complications: no       Last Vitals:  Vitals:   02/21/16 0636 02/21/16 0748  BP: (!) 104/48   Pulse: 88   Resp:    Temp: (!) 38 C 36.7 C    Last Pain:  Vitals:   02/21/16 0748  TempSrc: Oral  PainSc:                  Dubberly S

## 2016-02-21 NOTE — Evaluation (Signed)
Physical Therapy Evaluation Patient Details Name: Diamond Boyd MRN: [id number] DOB: 19-Feb-1940 Today's Date: 02/21/2016   History of Present Illness  76 yo presented to ER secondary to recurrent epistaxis, sinusitis s/p maxillary exploration. PMhx: non Hodgkin lymphoma, anemia, CHF, colon CA, COPD  Clinical Impression  Pt pleasant and agreeable to mobility. Pt and spouse report she has had progressive physical decline since November admissions for infection and Cdiff. Pt with decreased activity tolerance, balance, strength and function who will benefit from acute therapy to maximize mobility and decrease caregiver burden. With gait HR 80-90 with sats ranging from 84-94% on RA with varied reading not consistent with pauses and pursed lip breathing, on 2L sats remained 88% with activity and unclear if pt truly desaturating or monitor error as no pleth available.     Follow Up Recommendations Home health PT    Equipment Recommendations  None recommended by PT    Recommendations for Other Services       Precautions / Restrictions Precautions Precautions: Fall Restrictions Weight Bearing Restrictions: No      Mobility  Bed Mobility Overal bed mobility: Modified Independent             General bed mobility comments: with rail and increased time  Transfers Overall transfer level: Modified independent                  Ambulation/Gait Ambulation/Gait assistance: Supervision Ambulation Distance (Feet): 400 Feet Assistive device: Rolling walker (2 wheeled) Gait Pattern/deviations: Step-through pattern;Decreased stride length   Gait velocity interpretation: Below normal speed for age/gender General Gait Details: pt initiated gait without RW but reaching out for support and unsteady gait, with RW steady gait with decreased speed. cues for breathing technique, safety and RW use  Stairs Stairs: Yes Stairs assistance: Min assist Stair Management: Forwards Number of  Stairs: 3 General stair comments: hand held assist with cues for stability, pt initially reaching for rail with heavy reliance but none available at home  Wheelchair Mobility    Modified Rankin (Stroke Patients Only)       Balance Overall balance assessment: Needs assistance   Sitting balance-Leahy Scale: Good       Standing balance-Leahy Scale: Fair                               Pertinent Vitals/Pain Pain Assessment: No/denies pain    Home Living Family/patient expects to be discharged to:: Private residence Living Arrangements: Spouse/significant other Available Help at Discharge: Family Type of Home: House Home Access: Stairs to enter   Technical brewer of Steps: 3 Home Layout: One level Home Equipment: Environmental consultant - 2 wheels;Cane - single point;Bedside commode;Shower seat      Prior Function Level of Independence: Needs assistance   Gait / Transfers Assistance Needed: pt reports she holds onto furniture at home, cannot walk long distances, assist for stairs  ADL's / Homemaking Assistance Needed: spouse assists with drying off back and legs after a shower, assist for lower body dressing        Hand Dominance        Extremity/Trunk Assessment   Upper Extremity Assessment Upper Extremity Assessment: Generalized weakness    Lower Extremity Assessment Lower Extremity Assessment: Generalized weakness    Cervical / Trunk Assessment Cervical / Trunk Assessment: Kyphotic  Communication   Communication: No difficulties  Cognition Arousal/Alertness: Awake/alert Behavior During Therapy: Flat affect Overall Cognitive Status: Within Functional Limits for tasks assessed  General Comments      Exercises     Assessment/Plan    PT Assessment Patient needs continued PT services  PT Problem List Decreased activity tolerance;Decreased balance;Decreased knowledge of use of DME;Cardiopulmonary status limiting  activity;Decreased mobility;Decreased strength          PT Treatment Interventions Gait training;Therapeutic exercise;Patient/family education;Stair training;Balance training;Functional mobility training;DME instruction    PT Goals (Current goals can be found in the Care Plan section)  Acute Rehab PT Goals Patient Stated Goal: return home PT Goal Formulation: With patient/family Time For Goal Achievement: 03/06/16 Potential to Achieve Goals: Good    Frequency Min 3X/week   Barriers to discharge        Co-evaluation               End of Session Equipment Utilized During Treatment: Gait belt Activity Tolerance: Patient tolerated treatment well Patient left: in chair;with call bell/phone within reach;with family/visitor present Nurse Communication: Mobility status         Time: 7741-2878 PT Time Calculation (min) (ACUTE ONLY): 28 min   Charges:   PT Evaluation $PT Eval Moderate Complexity: 1 Procedure PT Treatments $Gait Training: 8-22 mins   PT G Codes:        Cinda Hara B Cruze Zingaro 03-22-16, 1:38 PM Elwyn Reach, [place]

## 2016-02-21 NOTE — Significant Event (Signed)
Rapid Response Event Note  Overview: Time Called: 8343 Arrival Time: 0535 Event Type: Other (Comment)  Initial Focused Assessment: RNs called to about patient having temperature of 103. I was seeing another patient at that time, so I instructed to RNs to inform the provider, if APAP was not ordered, to obtain orders for anti-pyretics.  Upon assessment of patient, patient was easily aroused.  Neuro intact, follow commands, patient was very warm to touch and flushed. She stated she has some chills earlier.  Patient was given '650mg'$  of APAP at 0504 for a temp of 103.  BP stable 100-120s/40s, MAPs stable, +pulses, patient comfortably on 3L oxygen, sats maintaining 94-98%, lung sounds clear.    I applied wash cloths that were soaked in ice water to her head and neck, other staff RNs did inform NP of 103 temp, who ordered that ice packs be applied for 30 minutes.  Ice packs were applied to bilateral groins as well and external cooling of the room.    Repeat vitals were temp of 100.3 orally, BP 104/41 (61), HR 88 in NSR, and 95 % on 3L [place].   Interventions: -- applied iced.  Plan of Care (if not transferred): -- inform providers and RRT if signs of sepsis are present, currently patient is only febrile.  Will monitor  Event Summary: Name of Physician Notified: Baltazar Najjar NP by Staff RN at      at          [facility], Delice Lesch

## 2016-02-21 NOTE — Op Note (Signed)
NAMEMENDI, CONSTABLE NO.:  [id number]  MEDICAL RECORD NO.:  [id number]  LOCATION:  [id number]                        FACILITY:  [facility]  PHYSICIAN:  Onnie Graham, MD     DATE OF BIRTH:  06-Oct-1940  DATE OF PROCEDURE:  02/20/2016 DATE OF DISCHARGE:                              OPERATIVE REPORT   PREOPERATIVE DIAGNOSIS:  Chronic maxillary, ethmoid, and frontal sinusitis.  POSTOPERATIVE DIAGNOSIS:  Chronic maxillary, ethmoid, and frontal sinusitis.  PROCEDURES: 1. Bilateral maxillary antrostomy. 2. Bilateral anterior ethmoidectomy. 3. Bilateral frontal recess exploration. 4. Fusion image guidance.  SURGEON:  Onnie Graham, MD.  ANESTHESIA:  General endotracheal anesthesia.  COMPLICATIONS:  None.  INDICATION:  The patient is a 76 year old female who has had treatment for lymphoma resulting in thrombocytopenia, anemia, and recurring sinus infections that have required multiple rounds of antibiotics over the last year.  She has developed Clostridium difficile infection because of all the antibiotics as well.  Endoscopic sinus surgery has been considered, but her various illnesses as well as heart failure have made that difficult to arrange.  She was admitted to the hospital late last week with epistaxis related to thrombocytopenia and then left nasal passage was packed.  Her oncologist is limited with what she can do for her with a sinus problem ongoing, so she presents to the operating room for surgical management after cardiac clearance.  FINDINGS:  The right-sided sinuses were all healthy and normal.  There was no abnormal tissue.  In the left side, after the pack was removed, there was purulent fluid within the left maxillary, frontal, and ethmoid sinuses.  There was no abnormal tissue except for edematous tissue and macerated tissue related to the nasal pack having been present.  There was no necrotic appearing tissue.  Cultures were taken from the  left middle meatus and sinus tissues that were removed were sent for pathology.  DESCRIPTION OF PROCEDURE:  The patient was identified in the holding room.  Informed consent having been obtained including discussion of risks, benefits, alternatives, the patient was brought to the operative suite and placed on the operating room table in supine position. Anesthesia was induced.  The patient was intubated by the Anesthesia Team without difficulty.  The patient was not given any antibiotics during the surgery.  Her eyes were lubricated and the nasal pack was removed from the left side.  The fusion antenna was placed in the center of the forehead and her face was then prepped and draped in sterile fashion.  She was registered to the fusion system in a standard fashion. The eyes were then taped closed with Steri-Strips.  Both nasal passages were packed with Afrin-soaked pledgets.  After the instruments were registered to the system, the right-sided pledgets were removed and the nasal passage was examined with a 0-degree telescope.  The lateral nasal wall was injected with local anesthetic, 1% lidocaine with 1:100,000 epinephrine, using a spinal needle in a standard fashion anteriorly and posteriorly.  The middle turbinate was medialized and the uncinate process was elevated off the lateral wall and incised with the backbiter and removed with the microdebrider.  The ethmoid bulla and anterior ethmoid cells were removed with  the microdebrider as well stopping at the basal lamella.  The lateral nasal wall was inspected with a 30- degree telescope and the natural maxillary ostium was identified using image guidance for assistance and was then widened posteriorly and inferiorly creating wide antrostomy.  The inside of the sinus appeared normal.  An angled telescope was then used to evaluate the frontal recess and some tissue was removed from the anterior ethmoid cells widely exposing the frontal  recess as confirmed by image guidance. Afrin pledgets were then placed in the right ethmoid cavity and attention was then directed to the left side.  The passage was suctioned with pus seen in the middle meatus.  Cultures were collected from that site with swabs.  The lateral nasal wall was injected in a standard fashion.  The middle turbinate was medialized and the uncinate process was removed.  The anterior ethmoid bulla and anterior ethmoid cells were removed with a microdebrider.  Some of the basal lamella was penetrated on the left side with a little bit of additional tissue removed.  The lateral nasal wall was then inspected with a 30-degree telescope and natural ostium of the maxillary sinus identified and widened posteriorly and inferiorly using curved microdebrider.  The maxillary sinus was suctioned of all fluid contents.  An angled telescope was then used to evaluate the frontal recess, where some purulent fluid was seen and the anterior ethmoid cells were removed from the frontal recess using a curved microdebrider widely exposing the frontal recess.  Afrin pledgets were placed in the left ethmoid as well.  After several minutes, the pledgets were removed and half the NasoPore pack coated with Bactroban ointment was placed in each ethmoid cavity and saturated with saline. The nasal passages were suctioned as was the throat and she was returned to Anesthesia for wake-up, was extubated, moved to recovery room in stable condition.     Onnie Graham, MD     DDB/MEDQ  D:  02/20/2016  T:  02/21/2016  Job:  [id number]

## 2016-02-21 NOTE — Progress Notes (Signed)
Progress Note    Diamond Boyd  VVO:[id number] DOB: April 26, 1940  DOA: 02/14/2016 PCP: [name], MD    Brief Narrative:   Chief complaint: Follow-up epistaxis  Diamond Boyd is an 76 y.o. female a PMH ofcolon cancer, Hodgkin's lymphoma, follicular B cell lymphoma in remission who was admitted 02/14/16 with a two-week history of intermittent epistaxis. In addition, the patient reported a 2 day history of intermittent fever ranging from 101.29F and 103F. In the emergency department, the patient had a low-grade temperature of 99.29F but was hemodynamically stable saturating 96% on room air. The patient was given Afrin which helped stop her epistaxis and had left anterior and posterior nasal packing done. BMP showed a sodium 133 otherwise was unremarkable. CBC showed hemoglobin 9.1 with platelets 16,000. Hematology/Oncology, ENT, and Cardiology were consulted.  Assessment/Plan:   Principal problem:  Recurrent Epistaxis with Persistent thrombocytopenia in the setting of chronic sinusitis ENT consulted. Patient is s/p Left Anterior and Posterior Nasal Packing done by Dr. Redmond Baseman.  Underwent bilateral maxillary antrostomy, bilateral anterior ethmoidectomy, bilateral frontal recess exploration per ENT 02/20/16. The patient follows Dr. Freeman Caldron pancytopenia, who has been following the patient, and who gave her an IVIG treatment early in her hospital course. Coagulation studies were abnormal and mixing study confirmed an acquired coagulopathy. Continue platelet transfusion to keep platelets greater than 10,000. She has received 4 units of platelets total, 2 units 02/14/16 and 2 units 02/20/16. Platelets 50,000 as morning. Continue loratadine.Patient did spike a high fever up to 103 this morning which has come down with Tylenol. Follow-up operative cultures of sinuses. Mobilize with PT in anticipation of D/C 02/22/16.Continue frequent nasal spray and follow-up with ENT as an outpatient in 2  weeks.  Active problems  Fever with Dyspnea/cough likely from Rhinovirus, improved Respiratory Viral Panel Positive for Rhinovirus/Enterovirus. CXR showed Mild chronic bronchitic change. No pneumonia, CHF, nor other acute cardiopulmonary abnormality. No evidence of UTI or recurrent C. difficile. Treating symptomatically.  Acute blood loss anemia secondary to Epistaxis vs. Cancer s/p transfusion of 2 units of pRBC Baseline hemoglobin 10-11. Transfuse to keep hemoglobin above 8 per oncology recommendations. Iron level 28, UIBC  228, TIBC  256, Ferritin  1,030, B12  613. Hemoglobin stable at 9.9.  Chronic systolic and diastolic CHF Echo done 26/94/8546 showed EF 27-03%, grade 2 diastolic dysfunction. Continue current medications including Lasix 20 mg daily and metoprolol 12.5 mg twice a day. Losartan currently on hold.  Paroxysmal Atrial Fibrillation Not a candidate for anticoagulation secondary to her pancytopenia. CHADSVASc = 6. His been maintaining sinus rhythm.  Hypertension Hold Losartan and Continue Metoprolol Tartrate at a lower dose as discussed above.  Hyperlipidemia Continue Simvastatin 40 mg po Daily.  Follicular B-cell lymphoma Recent PET scan shows the patient has no definitive signs of cancer but there is hypermetabolic activity noted around bone marrow area. Oncology following.  Hematemesis/Hemoptysis/Melena/BRBPR Felt to be from swallowed blood at this point. Repeat fecal occult blood testing was negative.  Lower Extremity Pain and Edema Venous duplex done and preliminary findings show no evidence of DVT or Bakers Cyst in the Lower Extremities.   Asthma Stable clinically. Continue BreoEllipta 1 puff Inhalation and Albuterol 2.5 Neb q 6 hours as needed.  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  Hypothyroidism Continue Synthroid 112 mcg.  Impaired Glucose Tolerance 10/24/2015 hemoglobin A1c 6.4.   Family Communication/Anticipated  D/C date and plan/Code Status   DVT prophylaxis: SCDs ordered. Code Status: Full Code.  Family Communication:  Sister updated at bedside 02/20/16. Disposition Plan: Home 02/22/16 depending on PT evaluation. Awaiting final operative culture report and given high fever, would monitor her another 24 hours.   Medical Consultants:    Oncology  Cardiology  ENT   Procedures:    None  Anti-Infectives:    None  Subjective:   The patient reports that she has had non-productive cough.  Reports a left sided headache and nausea.  Has had decreased appetite.   Objective:    Vitals:   02/20/16 2042 02/21/16 0456 02/21/16 0533 02/21/16 0636  BP: 125/66 (!) 124/47  (!) 104/48  Pulse: 90 (!) 103  88  Resp: 18 18    Temp: 99 F (37.2 C) (!) 103 F (39.4 C) (!) 103.1 F (39.5 C) (!) 100.4 F (38 C)  TempSrc: Oral Oral Rectal   SpO2: 94% 93%  95%  Weight:  102.3 kg (225 lb 8.5 oz)    Height:        Intake/Output Summary (Last 24 hours) at 02/21/16 0747 Last data filed at 02/20/16 1630  Gross per 24 hour  Intake             1410 ml  Output              100 ml  Net             1310 ml   Filed Weights   02/19/16 1715 02/20/16 0700 02/21/16 0456  Weight: 99.3 kg (218 lb 14.7 oz) 102.3 kg (225 lb 8.5 oz) 102.3 kg (225 lb 8.5 oz)    Exam: General exam: Appears calm and comfortable. Obese. Respiratory system: Clear to auscultation. Respiratory effort normal. Cardiovascular system: S1 & S2 heard, RRR. No JVD,  rubs, gallops or clicks. No murmurs. Gastrointestinal system: Abdomen is nondistended, soft and nontender. No organomegaly or masses felt. Normal bowel sounds heard. Central nervous system: Alert and oriented. No focal neurological deficits. Extremities: No clubbing,  or cyanosis. Trace edema. Skin: No rashes, lesions or ulcers. Pale with left hand ecchymosis. Psychiatry: Judgement and insight appear normal. Mood & affect appropriate.   Data Reviewed:   I have  personally reviewed following labs and imaging studies:  Labs: Basic Metabolic Panel:  Recent Labs Lab 02/16/16 0630 02/17/16 0530 02/18/16 0930 02/19/16 0720 02/20/16 0750 02/21/16 0435  NA 134* 134* 131* 136 137 134*  K 3.6 3.6 3.5 3.8 3.7 3.6  CL 101 102 100* 102 103 102  CO2 '27 26 25 26 26 25  '$ GLUCOSE 102* 110* 166* 101* 118* 107*  BUN '15 13 11 13 10 11  '$ CREATININE 0.64 0.58 0.67 0.51 0.85 0.75  CALCIUM 8.2* 8.1* 8.1* 8.2* 8.5* 8.0*  MG 2.2 2.1 1.9 2.0 2.1  --   PHOS 4.1 2.6 2.6 2.5 3.0  --    GFR Estimated Creatinine Clearance: 70.9 mL/min (by C-G formula based on SCr of 0.75 mg/dL). Liver Function Tests:  Recent Labs Lab 02/16/16 0630 02/17/16 0530 02/18/16 0930 02/19/16 0720 02/20/16 0750  AST 28 35 32 31 34  ALT '15 18 18 18 19  '$ ALKPHOS 193* 241* 249* 250* 305*  BILITOT 0.7 1.4* 1.1 1.1 1.1  PROT 6.6 6.3* 6.2* 6.3* 6.7  ALBUMIN 2.3* 2.4* 2.4* 2.4* 2.4*   No results for input(s): LIPASE, AMYLASE in the last 168 hours. No results for input(s): AMMONIA in the last 168 hours. Coagulation profile  Recent Labs Lab 02/14/16 1221  INR 1.13    CBC:  Recent Labs Lab  02/16/16 0630 02/17/16 0530 02/18/16 0930 02/19/16 0720 02/20/16 0750 02/20/16 1151 02/21/16 0435  WBC 7.4 7.9 5.9 6.1 7.2  --  7.6  NEUTROABS 0.9*  --  1.2* 1.1* 1.5*  --  1.7  HGB 7.8* 9.7* 10.6* 10.3* 11.7*  --  9.9*  HCT 22.5* 27.8* 30.3* 30.3* 34.6*  --  30.6*  MCV 95.7 92.1 93.5 94.4 94.8  --  96.8  PLT 31* 34* 24* 23* 49* 87* 50*   Cardiac Enzymes: No results for input(s): CKTOTAL, CKMB, CKMBINDEX, TROPONINI in the last 168 hours. BNP (last 3 results) No results for input(s): PROBNP in the last 8760 hours. CBG: No results for input(s): GLUCAP in the last 168 hours. D-Dimer: No results for input(s): DDIMER in the last 72 hours. Hgb A1c: No results for input(s): HGBA1C in the last 72 hours. Lipid Profile: No results for input(s): CHOL, HDL, LDLCALC, TRIG, CHOLHDL,  LDLDIRECT in the last 72 hours. Thyroid function studies: No results for input(s): TSH, T4TOTAL, T3FREE, THYROIDAB in the last 72 hours.  Invalid input(s): FREET3 Anemia work up: No results for input(s): VITAMINB12, FOLATE, FERRITIN, TIBC, IRON, RETICCTPCT in the last 72 hours. Sepsis Labs:  Recent Labs Lab 02/18/16 0930 02/19/16 0720 02/20/16 0750 02/21/16 0435  WBC 5.9 6.1 7.2 7.6    Microbiology Recent Results (from the past 240 hour(s))  Respiratory Panel by PCR     Status: Abnormal   Collection Time: 02/14/16  3:43 PM  Result Value Ref Range Status   Adenovirus NOT DETECTED NOT DETECTED Final   Coronavirus 229E NOT DETECTED NOT DETECTED Final   Coronavirus HKU1 NOT DETECTED NOT DETECTED Final   Coronavirus NL63 NOT DETECTED NOT DETECTED Final   Coronavirus OC43 NOT DETECTED NOT DETECTED Final   Metapneumovirus NOT DETECTED NOT DETECTED Final   Rhinovirus / Enterovirus DETECTED (A) NOT DETECTED Final   Influenza A NOT DETECTED NOT DETECTED Final   Influenza B NOT DETECTED NOT DETECTED Final   Parainfluenza Virus 1 NOT DETECTED NOT DETECTED Final   Parainfluenza Virus 2 NOT DETECTED NOT DETECTED Final   Parainfluenza Virus 3 NOT DETECTED NOT DETECTED Final   Parainfluenza Virus 4 NOT DETECTED NOT DETECTED Final   Respiratory Syncytial Virus NOT DETECTED NOT DETECTED Final   Bordetella pertussis NOT DETECTED NOT DETECTED Final   Chlamydophila pneumoniae NOT DETECTED NOT DETECTED Final   Mycoplasma pneumoniae NOT DETECTED NOT DETECTED Final    Comment: Performed at [facility]  Urine culture     Status: Abnormal   Collection Time: 02/14/16  3:43 PM  Result Value Ref Range Status   Specimen Description URINE, CLEAN CATCH  Final   Special Requests NONE  Final   Culture MULTIPLE SPECIES PRESENT, SUGGEST RECOLLECTION (A)  Final   Report Status 02/16/2016 FINAL  Final  Culture, blood (routine x 2)     Status: None (Preliminary result)   Collection Time: 02/15/16  10:09 PM  Result Value Ref Range Status   Specimen Description RIGHT ANTECUBITAL  Final   Special Requests BOTTLES DRAWN AEROBIC AND ANAEROBIC 8CC  Final   Culture   Final    NO GROWTH 4 DAYS Performed at [facility]    Report Status PENDING  Incomplete  Culture, blood (routine x 2)     Status: None (Preliminary result)   Collection Time: 02/15/16 10:09 PM  Result Value Ref Range Status   Specimen Description RIGHT ANTECUBITAL  Final   Special Requests IN PEDIATRIC BOTTLE 3CC  Final  Culture   Final    NO GROWTH 4 DAYS Performed at [facility]    Report Status PENDING  Incomplete  Aerobic/Anaerobic Culture (surgical/deep wound)     Status: None (Preliminary result)   Collection Time: 02/20/16  2:12 PM  Result Value Ref Range Status   Specimen Description SINUS  Final   Special Requests LEFT MIDDLE MEATUS  Final   Gram Stain   Final    ABUNDANT WBC PRESENT, PREDOMINANTLY PMN ABUNDANT GRAM POSITIVE COCCI IN CHAINS IN PAIRS RARE GRAM NEGATIVE RODS    Culture PENDING  Incomplete   Report Status PENDING  Incomplete    Radiology: No results found.  Medications:   . sodium chloride   Intravenous Once  . acetaminophen  650 mg Oral Once  . acidophilus  1 capsule Oral Daily  . acyclovir  400 mg Oral Daily  . calcium-vitamin D  1 tablet Oral QPM  . cholecalciferol  1,000 Units Oral Daily  . estradiol  1 mg Oral Daily  . FLUoxetine  20 mg Oral Daily  . fluticasone furoate-vilanterol  1 puff Inhalation QPM  . furosemide  20 mg Oral Daily  . gabapentin  300 mg Oral BID  . levothyroxine  112 mcg Oral QAC breakfast  . loratadine  10 mg Oral Daily  . metoprolol  12.5 mg Oral BID  . multivitamin with minerals  1 tablet Oral QPM  . pantoprazole (PROTONIX) IV  40 mg Intravenous Q12H  . simvastatin  40 mg Oral QHS  . sodium chloride  2 spray Each Nare Q2H while awake   Continuous Infusions:  Medical decision making is of high complexity and this patient is at  high risk of deterioration, therefore this is a level 3 visit.  (> 4 problem points, 2 data points, high risk)   Problems/DDx Points   Self limited or minor (max 2)       1   Established problem, stable       1   Established problem, worsening       2   New problem, no additional W/U planned (max 1)       3   New problem, additional W/U planned        4    Data Reviewed Points   Review/order clinical lab tests       1   Review/order x-rays       1   Review/order tests (Echo, EKG, PFTs, etc)       1   Discussion of test results w/ performing MD       1   Independent review of image, tracing or specimen       2   Decision to obtain old records       1   Review and summation of old records       2    Level of risk Presenting prob Diagnostics Management   Minimal 1 self limited/minor Labs CXR EKG/EEG U/A U/S Rest Gargles Bandages Dressings   Low 2 or more self limited/minor 1 stable chronic Acute uncomplicated illness Tests (PFTS) Non-CV imaging Arterial labs Biopsies of skin OTC drugs Minor surgery-no risk PT OT IVF without additives    Moderate 1 or more chronic illnesses w/ mild exac, progression or S/E from tx 2 or more stable chronic illnesses Undiagnosed new problem w/ uncertain prognosis Acute complicated injury  Stress tests Endoscopies with no risk factors Deep needle or incisional bx CV imaging without risk  LP Thoracentesis Paracentesis Minor surgery w/ risks Elective major surgery w/ no risk (open, percutaneous or endoscopic) Prescription drugs Therapeutic nucl med IVF with additives Closed tx of fracture/dislocation    High Severe exac of chronic illness Acute or chronic illness/injury may pose a threat to life or bodily function (ARF) Change in neuro status    CV imaging w/ contrast and risk Cardio electophysiologic tests Endoscopies w/ risk Discography Elective major surgery Emergency major surgery Parenteral controlled substances Drug  therapy req monitoring for toxicity DNR/de-escalation of care    MDM Prob points Data points Risk   Straightforward    <1    <1    Min   Low complexity    2    2    Low   Moderate    3    3    Mod   High Complexity    4 or more    4 or more    High      LOS: 6 days   Demorio Seeley  [facility] Pager [phone]. If unable to reach me by pager, please call my cell phone at [phone].  *Please refer to [email or web address], password [email or web address] to get updated schedule on who will round on this patient, as hospitalists switch teams weekly. If 7PM-7AM, please contact night-coverage at [email or web address], password [email or web address] for any overnight needs.  02/21/2016, 7:47 AM

## 2016-02-21 NOTE — Progress Notes (Signed)
Diamond Boyd   DOB:01-May-1940   SL#:[id number]    Subjective: She denies further bleeding since surgery. Her husband is present and provided most of the history. The patient is still intermittently sedated and sleepy  Objective:  Vitals:   02/21/16 0636 02/21/16 0748  BP: (!) 104/48   Pulse: 88   Resp:    Temp: (!) 100.4 F (38 C) 98.1 F (36.7 C)     Intake/Output Summary (Last 24 hours) at 02/21/16 2111 Last data filed at 02/20/16 1630  Gross per 24 hour  Intake             1410 ml  Output              100 ml  Net             1310 ml    GENERAL:alert, no distress and comfortable NEURO: alert & oriented x 3 with fluent speech, no focal motor/sensory deficits   Labs:  Lab Results  Component Value Date   WBC 7.6 02/21/2016   HGB 9.9 (L) 02/21/2016   HCT 30.6 (L) 02/21/2016   MCV 96.8 02/21/2016   PLT 50 (L) 02/21/2016   NEUTROABS 1.7 02/21/2016    Lab Results  Component Value Date   NA 134 (L) 02/21/2016   K 3.6 02/21/2016   CL 102 02/21/2016   CO2 25 02/21/2016    Assessment & Plan:   History of non-Hodgkin lymphoma Recent PET CT scan showed no definitive signs of cancer recurrence although there is hypermetabolic activity noted around her bone marrow area She is currently not on treatment  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, 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 Recommend transfusion as needed to keep hemoglobin above 8 g She received 2 units of blood on 02/16/2016 She will need irradiated blood products  Recurrent nosebleed Severe thrombocytopenia She had received platelet transfusion on 02/14/2016 & 02/20/16 Coagulation study came back abnormal Mixing studies confirmed acquired coagulopathy She will need intermittent platelet transfusion to keep platelet count greater than 10,000  Chronic recurrent sinusitis, fever dosage This has been a major issue as a cause recurrent severe infection to the point that after multiple courses of antibiotic therapy, she  developed recent Clostridium difficile infection On 02/20/2016, she underwent bilateral maxillary exploration. Final operative note is pending  Severe protein calorie malnutrition Recommend dietitian consult  DVT prophylaxis I do not recommend Lovenox due to recent bleeding and coagulopathy  History of CHF, deconditioning Recommend PT assessment. Hopefully she can be discharged soon if safe to be discharged  Discharge planning Hopefully the next day or 2. I will schedule return appointment in my office next week. Will sign off.  [name], MD 02/21/2016  8:28 AM

## 2016-02-21 NOTE — Progress Notes (Signed)
   Subjective:    Patient ID: Diamond Boyd, female    DOB: 03/31/1940, 76 y.o.   MRN: [id number]  HPI Did well overnight.  No significant bleeding.  Slept well.    Review of Systems     Objective:   Physical Exam Alert, NAD No nose bleeding.       Assessment & Plan:  Chronic sinusitis s/p endoscopic sinus surgery.  She looks great today.  Continue frequent nasal saline spray.  She can follow-up with me in two weeks.

## 2016-02-22 ENCOUNTER — Telehealth: Payer: Self-pay | Admitting: Hematology and Oncology

## 2016-02-22 MED ORDER — METOPROLOL TARTRATE 25 MG PO TABS: 12.5000 mg | ORAL_TABLET | Freq: Two times a day (BID) | ORAL | 3 refills | Status: AC

## 2016-02-22 MED ORDER — METOPROLOL TARTRATE 50 MG PO TABS: ORAL_TABLET | ORAL | 3 refills | Status: DC

## 2016-02-22 MED ORDER — HEPARIN SOD (PORK) LOCK FLUSH 100 UNIT/ML IV SOLN
500.0000 [IU] | INTRAVENOUS | Status: AC | PRN
  Administered 2016-02-22: 500 [IU]

## 2016-02-22 MED ORDER — SALINE SPRAY 0.65 % NA SOLN: 2.0000 | NASAL | 0 refills | Status: AC

## 2016-02-22 NOTE — Care Management Important Message (Signed)
Important Message  Patient Details  Name: Diamond Boyd MRN: [id number] Date of Birth: August 12, 1940   Medicare Important Message Given:  Yes    Nathen May 02/22/2016, 12:22 PM

## 2016-02-22 NOTE — Telephone Encounter (Signed)
Left message re 1/17 lab/fu. Appointments scheduled per 1/12 schedule message. Per NG lab/fu to be 1/17 @ 8:30 am for lab and 9 am to see her.

## 2016-02-22 NOTE — Care Management Note (Signed)
Case Management Note Marvetta Gibbons RN, BSN Unit 2W-Case Manager [phone]  Patient Details  Name: Diamond Boyd MRN: [id number] Date of Birth: 23-Jun-1940  Subjective/Objective:  Pt admitted with acute blood loss-anemia-  Recurrent epistaxis- s/p nasal/sinus surgery per ENT                 Action/Plan: PTA pt lived at home, PT eval recommendation for HHPT- order has been placed- pt has RW at home- spoke with pt at bedside- choice offered for HHPT- per pt she would like to use [facility] for services- referral [facility] to [facility] with [facility] for HHPT- address and phone # confirmed with pt  Expected Discharge Date:  02/22/16          Expected Discharge Plan:  [place]  In-House Referral:     Discharge planning Services  CM Consult  Post Acute Care Choice:  Home Health Choice offered to:  Patient  DME Arranged:  N/A DME Agency:  NA  HH Arranged:  PT [place] Agency:  [place]  Status of Service:  Completed, signed off  If discussed at [place] of Stay Meetings, dates discussed:    Discharge Disposition: home with home health   Additional Comments:  Dawayne Patricia, RN 02/22/2016, 10:20 AM

## 2016-02-22 NOTE — Discharge Summary (Signed)
Physician Discharge Summary  MYLDRED RAJU ZTI:[id number] DOB: [birth year] DOA: 02/14/2016  PCP: [name], MD  Admit date: 02/14/2016 Discharge date: 02/22/2016  Admitted From: Home Discharge disposition: Home   Recommendations for Outpatient Follow-Up:   1. The Patient has scheduled follow-up with ENT and will follow-up with oncology as well. 2. Please follow-up final operative sinus cultures which were pending at discharge. 3. Home physical therapy set up. 4. Recommend repeat CBC in one week.   Discharge Diagnosis:   Principal problem:   Epistaxis with acute blood loss anemia  Active Problems:    Chronic maxillary sinusitis    Thrombocytopenia (HCC)    Benign essential HTN    PAF (paroxysmal atrial fibrillation) (HCC)    Epistaxis    Pancytopenia, acquired ([facility])    Acute blood loss anemia    Chronic combined systolic and diastolic CHF (congestive heart failure) (HCC)    Dyspnea    Melena    Difficulty in walking, not elsewhere classified    Upper respiratory infection/rhinovirus  Discharge Condition: Improved.  Diet recommendation: Low sodium, heart healthy.    History of Present Illness:    Diamond Boyd is an 76 y.o. female a PMH ofcolon cancer, Hodgkin's lymphoma, follicular B cell lymphoma in remission who was admitted 02/14/16 with a two-week history of intermittent epistaxis. In addition, the patient reported a 2 day history of intermittent fever ranging from 101.68F and 103F. In the emergency department, the patient had a low-grade temperature of 99.68F but was hemodynamically stable saturating 96% on room air. The patient was given Afrin which helped stop her epistaxis and had left anterior and posterior nasal packing done. BMP showed a sodium 133 otherwise was unremarkable. CBC showed hemoglobin 9.1 with platelets 16,000. Hematology/Oncology, ENT, and Cardiology were consulted.   Hospital Course by Problem:   Principal problem:    Recurrent Epistaxis with Persistent thrombocytopenia in the setting of chronic sinusitis ENT consulted. Patient is s/p Left Anterior and Posterior Nasal Packing done by Dr. Redmond Baseman.  Underwent bilateral maxillary antrostomy, bilateral anterior ethmoidectomy, bilateral frontal recess exploration per ENT 02/20/16. The patient follows Dr. Freeman Caldron pancytopenia, who has been following the patient, and who gave her an IVIG treatment early in her hospital course. Coagulation studies were abnormal and mixing study confirmed an acquired coagulopathy. Continue platelet transfusion to keep platelets greater than 10,000. She has received 4 units of platelets total, 2 units 02/14/16 and 2 units 02/20/16. Platelets 50,000 as morning. Continue loratadine. Follow-up operative cultures of sinuses. Continue frequent nasal spray and follow-up with ENT as an outpatient in 2 weeks.  Active problems  Fever with Dyspnea/cough likely from Rhinovirus, improved Respiratory Viral Panel Positive for Rhinovirus/Enterovirus. CXR showed Mild chronic bronchitic change. No pneumonia, CHF, nor other acute cardiopulmonary abnormality. No evidence of UTI or recurrent C. difficile. Treating symptomatically.  Acute blood loss anemia secondary to Epistaxis vs. Cancer s/p transfusion of 2 units of pRBC Baseline hemoglobin 10-11. Transfuse to keep hemoglobin above 8 per oncology recommendations. Iron level 28, UIBC  228, TIBC  256, Ferritin  1,030, B12  613. Hemoglobin stable at 9.9.  Chronic systolic and diastolic CHF Echo done 82/50/5397 showed EF 67-34%, grade 2 diastolic dysfunction. Continue current medications including Lasix 20 mg daily and metoprolol 12.5 mg twice a day. Losartan currently on hold.  Paroxysmal Atrial Fibrillation Not a candidate for anticoagulation secondary to her pancytopenia. CHADSVASc = 6. His been maintaining sinus rhythm.  Hypertension Hold Losartan and Continue Metoprolol Tartrate at a  lower dose as  discussed above.  Hyperlipidemia Continue Simvastatin 40 mg po Daily.  Follicular B-cell lymphoma Recent PET scan shows the patient has no definitive signs of cancer but there is hypermetabolic activity noted around bone marrow area. Oncology following.  Hematemesis/Hemoptysis/Melena/BRBPR Felt to be from swallowed blood at this point. Repeat fecal occult blood testing was negative.  Lower Extremity Pain and Edema Venous duplex done and preliminary findings show no evidence of DVT or Bakers Cyst in the Lower Extremities.   Asthma Stable clinically. Continue BreoEllipta 1 puff Inhalation and Albuterol 2.5 Neb q 6 hours as needed.  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  Hypothyroidism Continue Synthroid 112 mcg.  Impaired Glucose Tolerance 10/24/2015 hemoglobin A1c 6.4.    Medical Consultants:    Oncology  Cardiology  ENT   Discharge Exam:   Vitals:   02/21/16 2254 02/22/16 0533  BP: (!) 124/50 (!) 129/57  Pulse: 80 81  Resp:  18  Temp:  98.4 F (36.9 C)   Vitals:   02/21/16 2106 02/21/16 2254 02/22/16 0533 02/22/16 0800  BP: 113/64 (!) 124/50 (!) 129/57   Pulse: 86 80 81   Resp: 18  18   Temp: 99.6 F (37.6 C)  98.4 F (36.9 C)   TempSrc: Oral  Oral   SpO2: 97%  99% 98%  Weight:   98.9 kg (218 lb)   Height:       General exam: Appears calm and comfortable. Obese. Respiratory system: Clear to auscultation. Respiratory effort normal. Cardiovascular system: S1 & S2 heard, RRR. No JVD,  rubs, gallops or clicks. No murmurs. Gastrointestinal system: Abdomen is nondistended, soft and nontender. No organomegaly or masses felt. Normal bowel sounds heard. Central nervous system: Alert and oriented. No focal neurological deficits. Extremities: No clubbing,  or cyanosis. Trace edema. Skin: No rashes, lesions or ulcers. Pale with left hand ecchymosis. Psychiatry: Judgement and insight appear normal. Mood & affect appropriate.     The results of significant diagnostics from this hospitalization (including imaging, microbiology, ancillary and laboratory) are listed below for reference.     Procedures and Diagnostic Studies:   Dg Chest 2 View  Result Date: 02/14/2016 CLINICAL DATA:  Shortness of breath, weakness, and chest pain. History of asthma, CHF, EXAM: CHEST  2 VIEW COMPARISON:  Chest x-ray of January 08 2016 FINDINGS: The lungs are mildly hyperinflated with mild hemidiaphragm flattening. There is no focal infiltrate. There is no pleural effusion. The heart and pulmonary vascularity are normal. The power port catheter tip projects over the midportion of the SVC. There is calcification in the wall of the aortic arch. The bony thorax exhibits no acute abnormality. There is mild multilevel degenerative disc disease of the thoracic spine. IMPRESSION: Mild chronic bronchitic change. No pneumonia, CHF, nor other acute cardiopulmonary abnormality. Thoracic aortic atherosclerosis. Electronically Signed   By: David  Martinique M.D.   On: 02/14/2016 17:28     Labs:   Basic Metabolic Panel:  Recent Labs Lab 02/16/16 0630 02/17/16 0530 02/18/16 0930 02/19/16 0720 02/20/16 0750 02/21/16 0435  NA 134* 134* 131* 136 137 134*  K 3.6 3.6 3.5 3.8 3.7 3.6  CL 101 102 100* 102 103 102  CO2 '27 26 25 26 26 25  '$ GLUCOSE 102* 110* 166* 101* 118* 107*  BUN '15 13 11 13 10 11  '$ CREATININE 0.64 0.58 0.67 0.51 0.85 0.75  CALCIUM 8.2* 8.1* 8.1* 8.2* 8.5* 8.0*  MG 2.2 2.1 1.9 2.0 2.1  --   PHOS 4.1  2.6 2.6 2.5 3.0  --    GFR Estimated Creatinine Clearance: 69.7 mL/min (by C-G formula based on SCr of 0.75 mg/dL). Liver Function Tests:  Recent Labs Lab 02/16/16 0630 02/17/16 0530 02/18/16 0930 02/19/16 0720 02/20/16 0750  AST 28 35 32 31 34  ALT '15 18 18 18 19  '$ ALKPHOS 193* 241* 249* 250* 305*  BILITOT 0.7 1.4* 1.1 1.1 1.1  PROT 6.6 6.3* 6.2* 6.3* 6.7  ALBUMIN 2.3* 2.4* 2.4* 2.4* 2.4*    CBC:  Recent Labs Lab  02/16/16 0630 02/17/16 0530 02/18/16 0930 02/19/16 0720 02/20/16 0750 02/20/16 1151 02/21/16 0435  WBC 7.4 7.9 5.9 6.1 7.2  --  7.6  NEUTROABS 0.9*  --  1.2* 1.1* 1.5*  --  1.7  HGB 7.8* 9.7* 10.6* 10.3* 11.7*  --  9.9*  HCT 22.5* 27.8* 30.3* 30.3* 34.6*  --  30.6*  MCV 95.7 92.1 93.5 94.4 94.8  --  96.8  PLT 31* 34* 24* 23* 49* 87* 50*   Microbiology Recent Results (from the past 240 hour(s))  Respiratory Panel by PCR     Status: Abnormal   Collection Time: 02/14/16  3:43 PM  Result Value Ref Range Status   Adenovirus NOT DETECTED NOT DETECTED Final   Coronavirus 229E NOT DETECTED NOT DETECTED Final   Coronavirus HKU1 NOT DETECTED NOT DETECTED Final   Coronavirus NL63 NOT DETECTED NOT DETECTED Final   Coronavirus OC43 NOT DETECTED NOT DETECTED Final   Metapneumovirus NOT DETECTED NOT DETECTED Final   Rhinovirus / Enterovirus DETECTED (A) NOT DETECTED Final   Influenza A NOT DETECTED NOT DETECTED Final   Influenza B NOT DETECTED NOT DETECTED Final   Parainfluenza Virus 1 NOT DETECTED NOT DETECTED Final   Parainfluenza Virus 2 NOT DETECTED NOT DETECTED Final   Parainfluenza Virus 3 NOT DETECTED NOT DETECTED Final   Parainfluenza Virus 4 NOT DETECTED NOT DETECTED Final   Respiratory Syncytial Virus NOT DETECTED NOT DETECTED Final   Bordetella pertussis NOT DETECTED NOT DETECTED Final   Chlamydophila pneumoniae NOT DETECTED NOT DETECTED Final   Mycoplasma pneumoniae NOT DETECTED NOT DETECTED Final    Comment: Performed at [facility]  Urine culture     Status: Abnormal   Collection Time: 02/14/16  3:43 PM  Result Value Ref Range Status   Specimen Description URINE, CLEAN CATCH  Final   Special Requests NONE  Final   Culture MULTIPLE SPECIES PRESENT, SUGGEST RECOLLECTION (A)  Final   Report Status 02/16/2016 FINAL  Final  Culture, blood (routine x 2)     Status: None   Collection Time: 02/15/16 10:09 PM  Result Value Ref Range Status   Specimen Description RIGHT  ANTECUBITAL  Final   Special Requests BOTTLES DRAWN AEROBIC AND ANAEROBIC 8CC  Final   Culture   Final    NO GROWTH 5 DAYS Performed at [facility]    Report Status 02/21/2016 FINAL  Final  Culture, blood (routine x 2)     Status: None   Collection Time: 02/15/16 10:09 PM  Result Value Ref Range Status   Specimen Description RIGHT ANTECUBITAL  Final   Special Requests IN PEDIATRIC BOTTLE 3CC  Final   Culture   Final    NO GROWTH 5 DAYS Performed at [facility]    Report Status 02/21/2016 FINAL  Final  Aerobic/Anaerobic Culture (surgical/deep wound)     Status: None (Preliminary result)   Collection Time: 02/20/16  2:12 PM  Result Value Ref Range  Status   Specimen Description SINUS  Final   Special Requests LEFT MIDDLE MEATUS  Final   Gram Stain   Final    ABUNDANT WBC PRESENT, PREDOMINANTLY PMN ABUNDANT GRAM POSITIVE COCCI IN CHAINS IN PAIRS RARE GRAM NEGATIVE RODS    Culture   Final    CULTURE REINCUBATED FOR BETTER GROWTH HOLDING FOR POSSIBLE ANAEROBE    Report Status PENDING  Incomplete     Discharge Instructions:   Discharge Instructions    Call MD for:    Complete by:  As directed    Bleeding problems.   Call MD for:  extreme fatigue    Complete by:  As directed    Call MD for:  persistant dizziness or light-headedness    Complete by:  As directed    Call MD for:  persistant nausea and vomiting    Complete by:  As directed    Call MD for:  severe uncontrolled pain    Complete by:  As directed    Call MD for:  temperature >100.4    Complete by:  As directed    Diet - low sodium heart healthy    Complete by:  As directed    Increase activity slowly    Complete by:  As directed      Allergies as of 02/22/2016      Reactions   Achromycin [tetracycline Hcl] Other (See Comments)   UNSPECIFIED REACTION  Pt does not remember reaction   Allopurinol Other (See Comments)   REACTION: Unsure of reaction happene years ago   Astelin [azelastine  Hcl] Other (See Comments)   Reaction unknown   Cephalexin Other (See Comments)   REACTION: unsure of reaction happened yrs ago.   Codeine Other (See Comments)   REACTION: abd. pain   Lisinopril Cough   Meloxicam Other (See Comments)   REACTION: GI symptoms   Minocycline Other (See Comments)   Abdominal pain   Nabumetone Other (See Comments)   REACTION: reaction not known   Nyquil [pseudoeph-doxylamine-dm-apap] Hives   Sulfa Antibiotics Other (See Comments)   Gi side eff    Zolpidem Tartrate Other (See Comments)   REACTION: feels too drugged   Buspar [buspirone Hcl] Other (See Comments)   Dizziness, and not as effective for anxiety   Ciprofloxacin Rash      Medication List    STOP taking these medications   acidophilus Caps capsule   LORazepam 0.5 MG tablet Commonly known as:  ATIVAN   losartan 25 MG tablet Commonly known as:  COZAAR   predniSONE 20 MG tablet Commonly known as:  DELTASONE   vancomycin 50 mg/mL oral solution Commonly known as:  VANCOCIN     TAKE these medications   acetaminophen 650 MG CR tablet Commonly known as:  TYLENOL Take 650 mg by mouth every 8 (eight) hours as needed for pain or fever.   acyclovir 400 MG tablet Commonly known as:  ZOVIRAX Take 1 tablet (400 mg total) by mouth daily. What changed:  when to take this   BREO ELLIPTA 200-25 MCG/INH Aepb Generic drug:  fluticasone furoate-vilanterol Inhale 1 puff into the lungs every evening. Notes to patient:  Please take medicine this evening   CALCIUM 600+D 600-400 MG-UNIT tablet Generic drug:  Calcium Carbonate-Vitamin D Take 1 tablet by mouth every evening. Notes to patient:  Please take medication this evening   estradiol 1 MG tablet Commonly known as:  ESTRACE Take 1 tablet (1 mg total) by mouth daily. What  changed:  when to take this   fexofenadine 180 MG tablet Commonly known as:  ALLEGRA TAKE 1 TABLET (180 MG TOTAL) BY MOUTH DAILY. (*OTC)   FLUoxetine 20 MG  capsule Commonly known as:  PROZAC Take 1 capsule (20 mg total) by mouth daily. What changed:  when to take this   furosemide 20 MG tablet Commonly known as:  LASIX Take 1 tablet (20 mg total) by mouth daily. Take 1 tab by mouth daily for weight gain. What changed:  when to take this  additional instructions   gabapentin 300 MG capsule Commonly known as:  NEURONTIN Take 1 capsule (300 mg total) by mouth 2 (two) times daily. Notes to patient:  Please take medication tonight before bed   levothyroxine 112 MCG tablet Commonly known as:  SYNTHROID, LEVOTHROID Take 1 tablet (112 mcg total) by mouth daily before breakfast.   lidocaine-prilocaine cream Commonly known as:  EMLA Apply 1 application topically as needed (For port-a-cath.). Reported on 02/22/2015   metoprolol tartrate 25 MG tablet Commonly known as:  LOPRESSOR Take 0.5 tablets (12.5 mg total) by mouth 2 (two) times daily. What changed:  medication strength  how much to take  how to take this  when to take this  additional instructions Notes to patient:  Please take medication tonight before bed   MULTIVITAMIN ADULTS PO Take 1 tablet by mouth every evening. Notes to patient:  Please take medication before bed   Omega-3 350 MG Caps Take 1 capsule by mouth daily. Reported on 02/22/2015   omeprazole 40 MG capsule Commonly known as:  PRILOSEC Take 40 mg by mouth at bedtime. Notes to patient:  Please take medication before bed   ondansetron 8 MG tablet Commonly known as:  ZOFRAN Take 8 mg by mouth every 6 (six) hours as needed for nausea or vomiting. Reported on 05/03/5571   PROAIR RESPICLICK 220 (90 Base) MCG/ACT Aepb Generic drug:  Albuterol Sulfate Inhale 1-2 puffs into the lungs every 6 (six) hours as needed (SOB, wheezing).   PROBIOTIC ACIDOPHILUS Caps Take 1 capsule by mouth every evening. Notes to patient:  Please take medication this evening   prochlorperazine 10 MG tablet Commonly known as:   COMPAZINE Take 10 mg by mouth every 6 (six) hours as needed for nausea or vomiting. Reported on 03/05/2015   simvastatin 40 MG tablet Commonly known as:  ZOCOR Take 1 tablet (40 mg total) by mouth at bedtime. Notes to patient:  Please take medication tonight before bed   sodium chloride 0.65 % Soln nasal spray Commonly known as:  OCEAN Place 2 sprays into both nostrils every 2 (two) hours while awake. Notes to patient:  2 PM   Vitamin D-3 1000 units Caps Take 1,000 Units by mouth every morning.      Follow-up Information    BATES, DWIGHT, MD. Schedule an appointment as soon as possible for a visit in 2 week(s).   Specialty:  Otolaryngology Contact information: [address] [place] [address] [phone]        [facility] Follow up.   Why:  HHPT arranged- they will call you to set up home visits Contact information: [place] [address] [phone]        [name], MD. Schedule an appointment as soon as possible for a visit in 1 week(s).   Specialty:  Hematology and Oncology Why:  To check your blood counts. Contact information: [place]  [place] [address] [phone]            Time coordinating discharge: Greater than 30 minutes  Signed:  Emina Ribaudo  Pager [phone] [facility] 02/22/2016, 4:35 PM

## 2016-02-23 LAB — TYPE AND SCREEN
ABO/RH(D): A POS
ANTIBODY SCREEN: NEGATIVE
DONOR AG TYPE: NEGATIVE
Donor AG Type: NEGATIVE
Unit division: 0
Unit division: 0

## 2016-02-25 DIAGNOSIS — I110 Hypertensive heart disease with heart failure: Secondary | ICD-10-CM | POA: Diagnosis not present

## 2016-02-25 DIAGNOSIS — C8228 Follicular lymphoma grade III, unspecified, lymph nodes of multiple sites: Secondary | ICD-10-CM | POA: Diagnosis not present

## 2016-02-25 DIAGNOSIS — C8190 Hodgkin lymphoma, unspecified, unspecified site: Secondary | ICD-10-CM | POA: Diagnosis not present

## 2016-02-25 DIAGNOSIS — Z85828 Personal history of other malignant neoplasm of skin: Secondary | ICD-10-CM | POA: Diagnosis not present

## 2016-02-25 DIAGNOSIS — I5042 Chronic combined systolic (congestive) and diastolic (congestive) heart failure: Secondary | ICD-10-CM | POA: Diagnosis not present

## 2016-02-25 DIAGNOSIS — Z85038 Personal history of other malignant neoplasm of large intestine: Secondary | ICD-10-CM | POA: Diagnosis not present

## 2016-02-25 DIAGNOSIS — Z9181 History of falling: Secondary | ICD-10-CM | POA: Diagnosis not present

## 2016-02-25 DIAGNOSIS — F329 Major depressive disorder, single episode, unspecified: Secondary | ICD-10-CM | POA: Diagnosis not present

## 2016-02-25 DIAGNOSIS — E039 Hypothyroidism, unspecified: Secondary | ICD-10-CM | POA: Diagnosis not present

## 2016-02-25 DIAGNOSIS — M19042 Primary osteoarthritis, left hand: Secondary | ICD-10-CM | POA: Diagnosis not present

## 2016-02-25 DIAGNOSIS — I739 Peripheral vascular disease, unspecified: Secondary | ICD-10-CM | POA: Diagnosis not present

## 2016-02-25 DIAGNOSIS — G620 Drug-induced polyneuropathy: Secondary | ICD-10-CM | POA: Diagnosis not present

## 2016-02-25 DIAGNOSIS — I480 Paroxysmal atrial fibrillation: Secondary | ICD-10-CM | POA: Diagnosis not present

## 2016-02-25 DIAGNOSIS — M5134 Other intervertebral disc degeneration, thoracic region: Secondary | ICD-10-CM | POA: Diagnosis not present

## 2016-02-25 DIAGNOSIS — J320 Chronic maxillary sinusitis: Secondary | ICD-10-CM | POA: Diagnosis not present

## 2016-02-25 DIAGNOSIS — D630 Anemia in neoplastic disease: Secondary | ICD-10-CM | POA: Diagnosis not present

## 2016-02-25 DIAGNOSIS — T451X5S Adverse effect of antineoplastic and immunosuppressive drugs, sequela: Secondary | ICD-10-CM | POA: Diagnosis not present

## 2016-02-25 DIAGNOSIS — R2681 Unsteadiness on feet: Secondary | ICD-10-CM | POA: Diagnosis not present

## 2016-02-25 DIAGNOSIS — M19041 Primary osteoarthritis, right hand: Secondary | ICD-10-CM | POA: Diagnosis not present

## 2016-02-25 DIAGNOSIS — J449 Chronic obstructive pulmonary disease, unspecified: Secondary | ICD-10-CM | POA: Diagnosis not present

## 2016-02-25 DIAGNOSIS — M6281 Muscle weakness (generalized): Secondary | ICD-10-CM | POA: Diagnosis not present

## 2016-02-25 LAB — AEROBIC/ANAEROBIC CULTURE (SURGICAL/DEEP WOUND): CULTURE: NORMAL

## 2016-02-25 LAB — AEROBIC/ANAEROBIC CULTURE W GRAM STAIN (SURGICAL/DEEP WOUND)

## 2016-02-26 ENCOUNTER — Other Ambulatory Visit: Payer: Self-pay | Admitting: Hematology and Oncology

## 2016-02-26 ENCOUNTER — Telehealth: Payer: Self-pay | Admitting: Hematology and Oncology

## 2016-02-26 DIAGNOSIS — C8298 Follicular lymphoma, unspecified, lymph nodes of multiple sites: Secondary | ICD-10-CM

## 2016-02-26 NOTE — Telephone Encounter (Signed)
Patient called to reschedule 02/27/16 appointment, per inclement weather expected. Per Dr Alvy Bimler, will see patient on 02/28/16 @ 10:30 am.

## 2016-02-27 ENCOUNTER — Ambulatory Visit: Payer: Medicare Other | Admitting: Hematology and Oncology

## 2016-02-27 ENCOUNTER — Other Ambulatory Visit: Payer: Medicare Other

## 2016-02-28 ENCOUNTER — Ambulatory Visit: Payer: Medicare Other | Admitting: Hematology and Oncology

## 2016-02-28 ENCOUNTER — Other Ambulatory Visit: Payer: Medicare Other

## 2016-02-29 ENCOUNTER — Telehealth: Payer: Self-pay | Admitting: Hematology and Oncology

## 2016-02-29 NOTE — Telephone Encounter (Signed)
Appointments was confirmed with patient .

## 2016-02-29 NOTE — Telephone Encounter (Signed)
02/27/17 appointment was rescheduled, per inclement weather, per 02/27/17 schd msg. 02/29/16

## 2016-03-03 DIAGNOSIS — J449 Chronic obstructive pulmonary disease, unspecified: Secondary | ICD-10-CM | POA: Diagnosis not present

## 2016-03-03 DIAGNOSIS — R2681 Unsteadiness on feet: Secondary | ICD-10-CM | POA: Diagnosis not present

## 2016-03-03 DIAGNOSIS — J320 Chronic maxillary sinusitis: Secondary | ICD-10-CM | POA: Diagnosis not present

## 2016-03-03 DIAGNOSIS — C8228 Follicular lymphoma grade III, unspecified, lymph nodes of multiple sites: Secondary | ICD-10-CM | POA: Diagnosis not present

## 2016-03-03 DIAGNOSIS — M6281 Muscle weakness (generalized): Secondary | ICD-10-CM | POA: Diagnosis not present

## 2016-03-03 DIAGNOSIS — D630 Anemia in neoplastic disease: Secondary | ICD-10-CM | POA: Diagnosis not present

## 2016-03-04 ENCOUNTER — Encounter: Payer: Medicare Other | Admitting: Hematology and Oncology

## 2016-03-04 ENCOUNTER — Encounter (HOSPITAL_COMMUNITY): Payer: Self-pay | Admitting: *Deleted

## 2016-03-04 ENCOUNTER — Inpatient Hospital Stay (HOSPITAL_COMMUNITY)
Admission: AD | Admit: 2016-03-04 | Discharge: 2016-03-18 | DRG: 813 | Disposition: A | Discharge status: Home Health | Payer: Medicare Other | Source: Ambulatory Visit | Attending: Hematology and Oncology | Admitting: Hematology and Oncology

## 2016-03-04 ENCOUNTER — Other Ambulatory Visit (HOSPITAL_BASED_OUTPATIENT_CLINIC_OR_DEPARTMENT_OTHER): Payer: Medicare Other

## 2016-03-04 DIAGNOSIS — D72820 Lymphocytosis (symptomatic): Secondary | ICD-10-CM | POA: Diagnosis present

## 2016-03-04 DIAGNOSIS — T451X5A Adverse effect of antineoplastic and immunosuppressive drugs, initial encounter: Secondary | ICD-10-CM

## 2016-03-04 DIAGNOSIS — R5383 Other fatigue: Secondary | ICD-10-CM | POA: Diagnosis not present

## 2016-03-04 DIAGNOSIS — R630 Anorexia: Secondary | ICD-10-CM

## 2016-03-04 DIAGNOSIS — D638 Anemia in other chronic diseases classified elsewhere: Secondary | ICD-10-CM | POA: Diagnosis not present

## 2016-03-04 DIAGNOSIS — C8298 Follicular lymphoma, unspecified, lymph nodes of multiple sites: Secondary | ICD-10-CM

## 2016-03-04 DIAGNOSIS — F329 Major depressive disorder, single episode, unspecified: Secondary | ICD-10-CM | POA: Diagnosis present

## 2016-03-04 DIAGNOSIS — R748 Abnormal levels of other serum enzymes: Secondary | ICD-10-CM

## 2016-03-04 DIAGNOSIS — M5134 Other intervertebral disc degeneration, thoracic region: Secondary | ICD-10-CM

## 2016-03-04 DIAGNOSIS — I4891 Unspecified atrial fibrillation: Secondary | ICD-10-CM

## 2016-03-04 DIAGNOSIS — J45909 Unspecified asthma, uncomplicated: Secondary | ICD-10-CM

## 2016-03-04 DIAGNOSIS — K5900 Constipation, unspecified: Secondary | ICD-10-CM | POA: Diagnosis not present

## 2016-03-04 DIAGNOSIS — K7689 Other specified diseases of liver: Secondary | ICD-10-CM | POA: Diagnosis not present

## 2016-03-04 DIAGNOSIS — D702 Other drug-induced agranulocytosis: Secondary | ICD-10-CM

## 2016-03-04 DIAGNOSIS — E039 Hypothyroidism, unspecified: Secondary | ICD-10-CM | POA: Diagnosis present

## 2016-03-04 DIAGNOSIS — Z8701 Personal history of pneumonia (recurrent): Secondary | ICD-10-CM

## 2016-03-04 DIAGNOSIS — E876 Hypokalemia: Secondary | ICD-10-CM | POA: Diagnosis not present

## 2016-03-04 DIAGNOSIS — D693 Immune thrombocytopenic purpura: Principal | ICD-10-CM

## 2016-03-04 DIAGNOSIS — R531 Weakness: Secondary | ICD-10-CM

## 2016-03-04 DIAGNOSIS — I5022 Chronic systolic (congestive) heart failure: Secondary | ICD-10-CM | POA: Diagnosis not present

## 2016-03-04 DIAGNOSIS — I739 Peripheral vascular disease, unspecified: Secondary | ICD-10-CM | POA: Diagnosis present

## 2016-03-04 DIAGNOSIS — C8179 Other classical Hodgkin lymphoma, extranodal and solid organ sites: Secondary | ICD-10-CM

## 2016-03-04 DIAGNOSIS — I429 Cardiomyopathy, unspecified: Secondary | ICD-10-CM

## 2016-03-04 DIAGNOSIS — I5023 Acute on chronic systolic (congestive) heart failure: Secondary | ICD-10-CM | POA: Diagnosis not present

## 2016-03-04 DIAGNOSIS — K649 Unspecified hemorrhoids: Secondary | ICD-10-CM

## 2016-03-04 DIAGNOSIS — B9789 Other viral agents as the cause of diseases classified elsewhere: Secondary | ICD-10-CM | POA: Diagnosis present

## 2016-03-04 DIAGNOSIS — K648 Other hemorrhoids: Secondary | ICD-10-CM | POA: Diagnosis present

## 2016-03-04 DIAGNOSIS — Z79899 Other long term (current) drug therapy: Secondary | ICD-10-CM

## 2016-03-04 DIAGNOSIS — K1231 Oral mucositis (ulcerative) due to antineoplastic therapy: Secondary | ICD-10-CM | POA: Diagnosis not present

## 2016-03-04 DIAGNOSIS — K3189 Other diseases of stomach and duodenum: Secondary | ICD-10-CM | POA: Diagnosis not present

## 2016-03-04 DIAGNOSIS — G629 Polyneuropathy, unspecified: Secondary | ICD-10-CM

## 2016-03-04 DIAGNOSIS — C8190 Hodgkin lymphoma, unspecified, unspecified site: Secondary | ICD-10-CM | POA: Diagnosis not present

## 2016-03-04 DIAGNOSIS — Z85038 Personal history of other malignant neoplasm of large intestine: Secondary | ICD-10-CM | POA: Diagnosis not present

## 2016-03-04 DIAGNOSIS — Z85828 Personal history of other malignant neoplasm of skin: Secondary | ICD-10-CM | POA: Diagnosis not present

## 2016-03-04 DIAGNOSIS — I480 Paroxysmal atrial fibrillation: Secondary | ICD-10-CM | POA: Diagnosis present

## 2016-03-04 DIAGNOSIS — E440 Moderate protein-calorie malnutrition: Secondary | ICD-10-CM | POA: Diagnosis present

## 2016-03-04 DIAGNOSIS — R509 Fever, unspecified: Secondary | ICD-10-CM

## 2016-03-04 DIAGNOSIS — R609 Edema, unspecified: Secondary | ICD-10-CM | POA: Diagnosis not present

## 2016-03-04 DIAGNOSIS — D61818 Other pancytopenia: Secondary | ICD-10-CM | POA: Diagnosis not present

## 2016-03-04 DIAGNOSIS — D801 Nonfamilial hypogammaglobulinemia: Secondary | ICD-10-CM | POA: Diagnosis present

## 2016-03-04 DIAGNOSIS — Z800 Family history of malignant neoplasm of digestive organs: Secondary | ICD-10-CM

## 2016-03-04 DIAGNOSIS — K219 Gastro-esophageal reflux disease without esophagitis: Secondary | ICD-10-CM | POA: Diagnosis present

## 2016-03-04 DIAGNOSIS — R040 Epistaxis: Secondary | ICD-10-CM | POA: Diagnosis not present

## 2016-03-04 DIAGNOSIS — Z9841 Cataract extraction status, right eye: Secondary | ICD-10-CM

## 2016-03-04 DIAGNOSIS — I110 Hypertensive heart disease with heart failure: Secondary | ICD-10-CM | POA: Diagnosis present

## 2016-03-04 DIAGNOSIS — R933 Abnormal findings on diagnostic imaging of other parts of digestive tract: Secondary | ICD-10-CM | POA: Diagnosis not present

## 2016-03-04 DIAGNOSIS — C8590 Non-Hodgkin lymphoma, unspecified, unspecified site: Secondary | ICD-10-CM | POA: Diagnosis not present

## 2016-03-04 DIAGNOSIS — Z7189 Other specified counseling: Secondary | ICD-10-CM

## 2016-03-04 DIAGNOSIS — R64 Cachexia: Secondary | ICD-10-CM

## 2016-03-04 DIAGNOSIS — I10 Essential (primary) hypertension: Secondary | ICD-10-CM

## 2016-03-04 DIAGNOSIS — D701 Agranulocytosis secondary to cancer chemotherapy: Secondary | ICD-10-CM

## 2016-03-04 DIAGNOSIS — Z8572 Personal history of non-Hodgkin lymphomas: Secondary | ICD-10-CM | POA: Diagnosis not present

## 2016-03-04 DIAGNOSIS — I493 Ventricular premature depolarization: Secondary | ICD-10-CM

## 2016-03-04 DIAGNOSIS — C8330 Diffuse large B-cell lymphoma, unspecified site: Secondary | ICD-10-CM

## 2016-03-04 DIAGNOSIS — R0602 Shortness of breath: Secondary | ICD-10-CM

## 2016-03-04 DIAGNOSIS — E46 Unspecified protein-calorie malnutrition: Secondary | ICD-10-CM | POA: Diagnosis present

## 2016-03-04 DIAGNOSIS — Z6837 Body mass index (BMI) 37.0-37.9, adult: Secondary | ICD-10-CM | POA: Diagnosis not present

## 2016-03-04 DIAGNOSIS — Z9842 Cataract extraction status, left eye: Secondary | ICD-10-CM | POA: Diagnosis not present

## 2016-03-04 DIAGNOSIS — K5909 Other constipation: Secondary | ICD-10-CM

## 2016-03-04 DIAGNOSIS — M1990 Unspecified osteoarthritis, unspecified site: Secondary | ICD-10-CM

## 2016-03-04 DIAGNOSIS — D649 Anemia, unspecified: Secondary | ICD-10-CM

## 2016-03-04 DIAGNOSIS — Z86010 Personal history of colonic polyps: Secondary | ICD-10-CM

## 2016-03-04 DIAGNOSIS — R011 Cardiac murmur, unspecified: Secondary | ICD-10-CM

## 2016-03-04 DIAGNOSIS — R634 Abnormal weight loss: Secondary | ICD-10-CM

## 2016-03-04 LAB — CBC WITH DIFFERENTIAL/PLATELET
BASO%: 1.7 % (ref 0.0–2.0)
BASOS ABS: 0.1 10*3/uL (ref 0.0–0.1)
EOS%: 0 % (ref 0.0–7.0)
Eosinophils Absolute: 0 10*3/uL (ref 0.0–0.5)
HCT: 33.4 % — ABNORMAL LOW (ref 34.8–46.6)
HEMOGLOBIN: 11.3 g/dL — AB (ref 11.6–15.9)
LYMPH%: 60.9 % — AB (ref 14.0–49.7)
MCH: 31.7 pg (ref 25.1–34.0)
MCHC: 33.8 g/dL (ref 31.5–36.0)
MCV: 93.6 fL (ref 79.5–101.0)
MONO#: 0.8 10*3/uL (ref 0.1–0.9)
MONO%: 17.8 % — AB (ref 0.0–14.0)
NEUT#: 0.9 10*3/uL — ABNORMAL LOW (ref 1.5–6.5)
NEUT%: 19.6 % — AB (ref 38.4–76.8)
Platelets: 10 10*3/uL — CL (ref 145–400)
RBC: 3.57 10*6/uL — AB (ref 3.70–5.45)
RDW: 19.4 % — ABNORMAL HIGH (ref 11.2–14.5)
WBC: 4.7 10*3/uL (ref 3.9–10.3)
lymph#: 2.8 10*3/uL (ref 0.9–3.3)
nRBC: 17 % — ABNORMAL HIGH (ref 0–0)

## 2016-03-04 LAB — COMPREHENSIVE METABOLIC PANEL
ALBUMIN: 2.5 g/dL — AB (ref 3.5–5.0)
ALK PHOS: 527 U/L — AB (ref 40–150)
ALT: 21 U/L (ref 0–55)
AST: 71 U/L — ABNORMAL HIGH (ref 5–34)
Anion Gap: 14 mEq/L — ABNORMAL HIGH (ref 3–11)
BUN: 17.4 mg/dL (ref 7.0–26.0)
CO2: 18 mEq/L — ABNORMAL LOW (ref 22–29)
Calcium: 8.9 mg/dL (ref 8.4–10.4)
Chloride: 99 mEq/L (ref 98–109)
Creatinine: 0.8 mg/dL (ref 0.6–1.1)
EGFR: 70 mL/min/{1.73_m2} — AB (ref >90)
GLUCOSE: 104 mg/dL (ref 70–140)
POTASSIUM: 4.1 meq/L (ref 3.5–5.1)
SODIUM: 132 meq/L — AB (ref 136–145)
Total Bilirubin: 1.53 mg/dL — ABNORMAL HIGH (ref 0.20–1.20)
Total Protein: 5.6 g/dL — ABNORMAL LOW (ref 6.4–8.3)

## 2016-03-04 LAB — TECHNOLOGIST REVIEW

## 2016-03-04 MED ORDER — ONDANSETRON 4 MG PO TBDP: 4.0000 mg | ORAL_TABLET | Freq: Three times a day (TID) | ORAL | Status: DC | PRN

## 2016-03-04 MED ORDER — FLUOXETINE HCL 20 MG PO CAPS
20.0000 mg | ORAL_CAPSULE | Freq: Every day | ORAL | Status: DC
  Administered 2016-03-04 – 2016-03-10 (×7): 20 mg via ORAL
  Filled 2016-03-04 (×7): qty 1

## 2016-03-04 MED ORDER — ONDANSETRON HCL 4 MG/2ML IJ SOLN: 4.0000 mg | Freq: Three times a day (TID) | INTRAMUSCULAR | Status: DC | PRN

## 2016-03-04 MED ORDER — SODIUM CHLORIDE 0.9% FLUSH: 10.0000 mL | INTRAVENOUS | Status: DC | PRN

## 2016-03-04 MED ORDER — ALBUTEROL SULFATE (2.5 MG/3ML) 0.083% IN NEBU: 3.0000 mL | INHALATION_SOLUTION | Freq: Four times a day (QID) | RESPIRATORY_TRACT | Status: DC | PRN

## 2016-03-04 MED ORDER — ALUM & MAG HYDROXIDE-SIMETH 200-200-20 MG/5ML PO SUSP: 60.0000 mL | ORAL | Status: DC | PRN

## 2016-03-04 MED ORDER — IMMUNE GLOBULIN (HUMAN) 20 GM/200ML IV SOLN
400.0000 mg/kg | INTRAVENOUS | Status: AC
  Administered 2016-03-04 – 2016-03-08 (×5): 40 g via INTRAVENOUS
  Filled 2016-03-04 (×5): qty 400

## 2016-03-04 MED ORDER — LEVOTHYROXINE SODIUM 112 MCG PO TABS
112.0000 ug | ORAL_TABLET | Freq: Every day | ORAL | Status: DC
  Administered 2016-03-05 – 2016-03-18 (×14): 112 ug via ORAL
  Filled 2016-03-04 (×14): qty 1

## 2016-03-04 MED ORDER — FLUTICASONE FUROATE-VILANTEROL 200-25 MCG/INH IN AEPB
1.0000 | INHALATION_SPRAY | Freq: Every evening | RESPIRATORY_TRACT | Status: DC
  Administered 2016-03-04 – 2016-03-17 (×11): 1 via RESPIRATORY_TRACT
  Filled 2016-03-04: qty 28

## 2016-03-04 MED ORDER — PANTOPRAZOLE SODIUM 40 MG PO TBEC
40.0000 mg | DELAYED_RELEASE_TABLET | Freq: Every day | ORAL | Status: DC
  Administered 2016-03-04 – 2016-03-18 (×15): 40 mg via ORAL
  Filled 2016-03-04 (×15): qty 1

## 2016-03-04 MED ORDER — LIDOCAINE-PRILOCAINE 2.5-2.5 % EX CREA: TOPICAL_CREAM | CUTANEOUS | Status: DC | PRN

## 2016-03-04 MED ORDER — ESTRADIOL 1 MG PO TABS
1.0000 mg | ORAL_TABLET | Freq: Every day | ORAL | Status: DC
  Administered 2016-03-05 – 2016-03-18 (×14): 1 mg via ORAL
  Filled 2016-03-04 (×15): qty 1

## 2016-03-04 MED ORDER — ACYCLOVIR 400 MG PO TABS
400.0000 mg | ORAL_TABLET | Freq: Every day | ORAL | Status: DC
  Administered 2016-03-04 – 2016-03-18 (×15): 400 mg via ORAL
  Filled 2016-03-04 (×15): qty 1

## 2016-03-04 MED ORDER — SODIUM CHLORIDE 0.9 % IV SOLN
INTRAVENOUS | Status: AC
  Administered 2016-03-04: 13:00:00 via INTRAVENOUS

## 2016-03-04 MED ORDER — VITAMIN D3 25 MCG (1000 UNIT) PO TABS
1000.0000 [IU] | ORAL_TABLET | Freq: Every morning | ORAL | Status: DC
  Administered 2016-03-04 – 2016-03-18 (×15): 1000 [IU] via ORAL
  Filled 2016-03-04 (×15): qty 1

## 2016-03-04 MED ORDER — ENSURE ENLIVE PO LIQD: 237.0000 mL | Freq: Two times a day (BID) | ORAL | Status: DC

## 2016-03-04 MED ORDER — ACETAMINOPHEN ER 650 MG PO TBCR: 650.0000 mg | EXTENDED_RELEASE_TABLET | Freq: Three times a day (TID) | ORAL | Status: DC | PRN

## 2016-03-04 MED ORDER — SENNOSIDES-DOCUSATE SODIUM 8.6-50 MG PO TABS
1.0000 | ORAL_TABLET | Freq: Every evening | ORAL | Status: DC | PRN
  Administered 2016-03-16: 1 via ORAL
  Filled 2016-03-04: qty 1

## 2016-03-04 MED ORDER — ACETAMINOPHEN 325 MG PO TABS
650.0000 mg | ORAL_TABLET | ORAL | Status: DC | PRN
  Administered 2016-03-06 – 2016-03-17 (×12): 650 mg via ORAL
  Filled 2016-03-04 (×12): qty 2

## 2016-03-04 MED ORDER — GABAPENTIN 300 MG PO CAPS
300.0000 mg | ORAL_CAPSULE | Freq: Two times a day (BID) | ORAL | Status: DC
  Administered 2016-03-04 – 2016-03-18 (×29): 300 mg via ORAL
  Filled 2016-03-04 (×29): qty 1

## 2016-03-04 MED ORDER — ONDANSETRON HCL 4 MG PO TABS: 4.0000 mg | ORAL_TABLET | Freq: Three times a day (TID) | ORAL | Status: DC | PRN

## 2016-03-04 MED ORDER — METOPROLOL TARTRATE 25 MG PO TABS
12.5000 mg | ORAL_TABLET | Freq: Two times a day (BID) | ORAL | Status: DC
  Administered 2016-03-04 – 2016-03-18 (×29): 12.5 mg via ORAL
  Filled 2016-03-04 (×29): qty 1

## 2016-03-04 MED ORDER — LIDOCAINE-PRILOCAINE 2.5-2.5 % EX CREA
TOPICAL_CREAM | Freq: Once | CUTANEOUS | Status: AC
  Administered 2016-03-04: 12:00:00 via TOPICAL
  Filled 2016-03-04: qty 5

## 2016-03-04 MED ORDER — SODIUM CHLORIDE 0.9 % IV SOLN
8.0000 mg | Freq: Three times a day (TID) | INTRAVENOUS | Status: DC | PRN
  Filled 2016-03-04: qty 4

## 2016-03-04 MED ORDER — DEXAMETHASONE 4 MG PO TABS
40.0000 mg | ORAL_TABLET | Freq: Every day | ORAL | Status: AC
  Administered 2016-03-04 – 2016-03-07 (×4): 40 mg via ORAL
  Filled 2016-03-04 (×4): qty 10

## 2016-03-04 NOTE — H&P (Signed)
[place] ADMISSION NOTE  Patient Care Team: Abner Greenspan, MD as PCP - General Mosetta Anis, MD (Allergy) Fanny Skates, MD as Consulting Physician (General Surgery) Grace Isaac, MD as Consulting Physician (Cardiothoracic Surgery) Troy Sine, MD as Consulting Physician (Cardiology) Lonn Georgia, PA-C as Physician Assistant (Cardiology) [name], MD as Consulting Physician (Hematology and Oncology) Estill Cotta, MD as Consulting Physician (Ophthalmology) Melida Quitter, MD as Consulting Physician (Otolaryngology)  CHIEF COMPLAINTS/PURPOSE OF ADMISSION Acute ITP  HISTORY OF PRESENTING ILLNESS:  Diamond Boyd 76 y.o. female is admitted for further management of acute ITP Summary of oncologic history as follows: Oncology History   History of colon cancer   Staging form: Colon and Rectum, AJCC 7th Edition     Clinical: Stage IIA (T3, N0, M0) - Signed by [name], MD on 02/21/2014 History of hodgkin's lymphoma   Staging form: Lymphoid Neoplasms, AJCC 6th Edition     Clinical: Stage IV - Signed by [name], MD on 02/21/2014       History of Hodgkin's lymphoma   05/20/2013 Initial Diagnosis    Hodgkin lymphoma      06/27/2014 Imaging    Interval increase in metabolic activity of several small lymph nodes is concerning for lymphoma recurrence. Lymph nodes include a small right level 2 lymph node, right hilar lymph node, and right paratracheal lymph node      07/07/2014 Pathology Results     limited tissue from ultrasound-guided biopsy was nondiagnostic      07/07/2014 Procedure     ultrasound-guided biopsy was nondiagnostic      09/27/2014 Imaging    Repeat PET CT scan show diffuse disease concern for relapse.      10/17/2014 Pathology Results    Accession: [id number] Biopsy was nondiagnostic      10/17/2014 Procedure    She underwent bronchoscopy & EBUS & biopsy of  LN at Level 10L      11/01/2014 Surgery    She underwent  cronchoscopy with endobronchial ultrasound, mediastinoscopy and biopsy       11/01/2014 Pathology Results    Accession: [id number] biopsy showed no evidence of lymphoma, only granulomas.      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 ([place])      12/13/2014 Imaging    PET scan showed diffuse disease      12/28/2014 - 12/30/2014 Hospital Admission    She was admitted to the hospital for cycle one of R-ICE      01/18/2015 - 01/20/2015 Chemotherapy    She was admitted to the hospital for cycle 2 of R-ICE      01/20/2015 - 01/22/2015 Hospital Admission    She was then admitted to [facility] with respiratory failure/fluid overload and was noted to have mild cardiomyopathy      01/23/2015 -  01/27/2015 Hospital Admission    she was admitted to the hospital with new onset atrial fibrillation with rapid ventricular response and was found to have evidence of myocardial ischemia. She was discharged on medical management and oral anticoagulation therapy      01/25/2015 Procedure    Left heart cath showed non-obstructive lesions: Prox RCA lesion, 40% stenosed.   Prox LAD to Mid LAD lesion, 30% stenosed. Ost LM lesion, 25% stenosed      01/30/2015 Imaging    PET Ct scan showed near complete resolution of all disease      03/12/2015 - 09/10/2015 Chemotherapy    She received 3 doses of Rituximab every other month      07/10/2015 PET scan     PET showed Improved metabolic activity in the bony lesions with mild persistent hilar LN with increased SUV uptake      09/06/2015 Imaging    Ct scan showed interval development of mild mucosal thickening involving left maxillary and bilateral ethmoid sinuses consistent with mild sinusitis.      12/10/2015 - 12/16/2015 Hospital Admission    She was admitted to the hospital due to diarrhea and colitis and was found to have Clostridium difficile      01/06/2016 - 01/13/2016 Hospital Admission    She had recurrent hospitalization for weakness, pancytopenia and persistent C Difficile infection      01/24/2016 PET scan    No evidence of hypermetabolic soft tissue lymphoma. 2. Redevelopment of diffuse marrow hypermetabolism which could be due to marrow stimulation by chemotherapy. This makes evaluation and delineation of the previously described focal osseous lesions difficult. A right-sided sternal lesion is not significantly changed. 3. Diffuse thyroid hypermetabolism persists and may represent thyroiditis. 4. Cardiomegaly. Coronary artery atherosclerosis. Aortic atherosclerosis. 5. Possible constipation. 6. New left-sided sinus disease.      02/14/2016 - 02/22/2016 Hospital Admission    She was admitted for management of severe nose bleeds      02/21/2016 Surgery    She underwent surgery for :Bilateral maxillary antrostomy. 2. Bilateral anterior ethmoidectomy. 3. Bilateral frontal recess exploration      She is seen today for hospital follow-up She started to have nosebleeds again. She have reduced appetite, poor energy, shortness of breath on minimal exertion, recurrent high fevers at home with a maximum temperature 103.1 Her appetite is poor She had lost some weight since she was last seen She denies hematuria or hematochezia Denies recent chest pain or shortness of breath.  MEDICAL HISTORY:  Past Medical History:  Diagnosis Date  . Anemia    hx blood tx  . Asthma   . Benign  essential HTN   . Cancer ([place])    skin cancer- basal cell on arm / colon 2011  . Chronic systolic CHF (congestive heart failure), NYHA class 2 ([place]) 01/2015  . Colon cancer ([place])    2011, s/p surgery, in remission  . Colon polyps   . Degenerative disk disease    Thoracic spine  . Depression   . 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) 12/11/2014   malignant B cell lymphoma- being treated actively  . Generalized weakness 05/11/2015  . GERD (gastroesophageal reflux disease)   . Heart murmur   . hodgkins lymphoma   . Hypothyroidism   . Lymphoma ([address])   . Neuropathy due to chemotherapeutic drug ([place]) 06/19/2014  . NICM (nonischemic cardiomyopathy) ([place]) 01/2015  . Osteoarthritis    hands  . 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  PAF (paroxysmal atrial fibrillation) ([place])   . Peripheral vascular disease ([place])   . Pneumonia     SURGICAL HISTORY: Past Surgical History:  Procedure Laterality Date  . BONE MARROW BIOPSY  05/27/13  . BREAST BIOPSY  1996  . CARDIAC CATHETERIZATION N/A 01/25/2015   Procedure: Left Heart Cath and Coronary Angiography;  Surgeon: Peter M Martinique, MD;  Location: [facility];  Service: Cardiovascular;  Laterality: N/A;  . CATARACT EXTRACTION, BILATERAL  2016  . CHOLECYSTECTOMY    . COLECTOMY  8/11  . COLONOSCOPY W/ BIOPSIES    . DG BIOPSY LUNG    . LYMPH NODE BIOPSY N/A 11/01/2014   Procedure: MEDIASTINAL LYMPH NODE BIOPSY;  Surgeon: Grace Isaac, MD;  Location: [facility];  Service: Thoracic;  Laterality: N/A;  . MEDIASTINOSCOPY N/A 11/01/2014   Procedure: MEDIASTINOSCOPY;  Surgeon: Grace Isaac, MD;  Location: [facility];  Service: Thoracic;  Laterality: N/A;  . PLANTAR FASCIA RELEASE    . port-a-cath insertion    . SINUS ENDO W/FUSION Bilateral 02/20/2016   Procedure: BILATERAL MAXILLARY ANTROSTOMY, BILATERAL ANTERIOR ETHMOIDECTOMY, BILATERAL FRONTAL RECESS EXPLORATION, FUSION IMAGE GUIDANCE;  Surgeon: Melida Quitter, MD;   Location: [facility];  Service: ENT;  Laterality: Bilateral;  . SPLENECTOMY     lymphoma  . TENDON RELEASE     Right thumb   . VAGINAL HYSTERECTOMY    . VENTRAL HERNIA REPAIR  1998  . VIDEO BRONCHOSCOPY WITH ENDOBRONCHIAL ULTRASOUND N/A 10/17/2014   Procedure: VIDEO BRONCHOSCOPY WITH ENDOBRONCHIAL ULTRASOUND;  Surgeon: Javier Glazier, MD;  Location: [facility];  Service: Thoracic;  Laterality: N/A;  . VIDEO BRONCHOSCOPY WITH ENDOBRONCHIAL ULTRASOUND N/A 11/01/2014   Procedure: VIDEO BRONCHOSCOPY WITH ENDOBRONCHIAL ULTRASOUND;  Surgeon: Grace Isaac, MD;  Location: [facility];  Service: Thoracic;  Laterality: N/A;    SOCIAL HISTORY: Social History   Social History  . Marital status: Married    Spouse name: N/A  . Number of children: 2  . Years of education: N/A   Occupational History  . Retired  Retired   Social History Main Topics  . Smoking status: Never Smoker  . Smokeless tobacco: Never Used     Comment: Second-hand exposure through father.  . Alcohol use No  . Drug use: No  . Sexual activity: No   Other Topics Concern  . Not on file   Social History Narrative   Originally from [place]. Always lived in [place]. Previously has traveled to [place], [place] & up [employer] to [place]. No international travel. No pets currently. Remote parakeet exposure with her children. 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    Lives at home with husband. Weakness due to chemo, but otherwise independant       FAMILY HISTORY: Family History  Problem Relation Age of Onset  . Coronary artery disease Mother   . Diabetes Mother   . Fibromyalgia Daughter     chronic pain   . COPD Daughter   . Diabetes Brother   . Asthma Daughter   . Rheum arthritis Brother   . Clotting disorder Brother   . Alcohol abuse Sister   . Colon cancer Neg Hx   . Colon polyps Neg Hx   . Stomach cancer Neg Hx   . Rectal cancer Neg Hx   . Ulcerative colitis Neg Hx   . Crohn's disease Neg Hx     ALLERGIES:  is  allergic to achromycin [tetracycline hcl]; allopurinol; astelin [azelastine hcl]; cephalexin; codeine; lisinopril;  meloxicam; minocycline; nabumetone; nyquil [pseudoeph-doxylamine-dm-apap]; sulfa antibiotics; zolpidem tartrate; buspar [buspirone hcl]; and ciprofloxacin.  MEDICATIONS:  Current Facility-Administered Medications  Medication Dose Route Frequency Provider Last Rate Last Dose  . 0.9 %  sodium chloride infusion   Intravenous Continuous [name], MD 50 mL/hr at 03/04/16 1310    . acetaminophen (TYLENOL) tablet 650 mg  650 mg Oral Q4H PRN [name], MD      . acyclovir (ZOVIRAX) tablet 400 mg  400 mg Oral Daily Pluma Diniz, MD   400 mg at 03/04/16 1328  . albuterol (PROVENTIL) (2.5 MG/3ML) 0.083% nebulizer solution 3 mL  3 mL Inhalation Q6H PRN [name], MD      . alum & mag hydroxide-simeth (MAALOX/MYLANTA) 200-200-20 MG/5ML suspension 60 mL  60 mL Oral Q4H PRN Easten Maceachern, MD      . cholecalciferol (VITAMIN D) tablet 1,000 Units  1,000 Units Oral q morning - 10a Britian Jentz, MD      . dexamethasone (DECADRON) tablet 40 mg  40 mg Oral Daily [name], MD   40 mg at 03/04/16 1326  . estradiol (ESTRACE) tablet 1 mg  1 mg Oral Daily Audery Wassenaar, MD      . FLUoxetine (PROZAC) capsule 20 mg  20 mg Oral Daily Ladarien Beeks, MD      . fluticasone furoate-vilanterol (BREO ELLIPTA) 200-25 MCG/INH 1 puff  1 puff Inhalation QPM Clyde Zarrella, MD      . gabapentin (NEURONTIN) capsule 300 mg  300 mg Oral BID [name], MD   300 mg at 03/04/16 1328  . Immune Globulin 10% (PRIVIGEN) IV infusion 40 g  400 mg/kg Intravenous Q24H [name], MD      . Derrill Memo ON 03/05/2016] levothyroxine (SYNTHROID, LEVOTHROID) tablet 112 mcg  112 mcg Oral QAC breakfast [name], MD      . lidocaine-prilocaine (EMLA) cream   Topical PRN [name], MD      . metoprolol tartrate (LOPRESSOR) tablet 12.5 mg  12.5 mg Oral BID [name], MD   12.5 mg at 03/04/16 1328  . ondansetron (ZOFRAN) tablet 4-8 mg  4-8 mg Oral Q8H PRN [name], MD       Or  . ondansetron (ZOFRAN-ODT) disintegrating tablet 4-8 mg  4-8 mg Oral Q8H PRN [name], MD       Or  . ondansetron (ZOFRAN) injection 4 mg  4 mg Intravenous Q8H PRN [name], MD       Or  . ondansetron (ZOFRAN) 8 mg in sodium chloride 0.9 % 50 mL IVPB  8 mg Intravenous Q8H PRN Isabel Freese, MD      . pantoprazole (PROTONIX) EC tablet 40 mg  40 mg Oral Daily Mariateresa Batra, MD      . senna-docusate (Senokot-S) tablet 1 tablet  1 tablet Oral QHS PRN [name], MD       Facility-Administered Medications Ordered in Other Encounters  Medication Dose Route Frequency Provider Last Rate Last Dose  . sodium chloride 0.9 % injection 10 mL  10 mL Intravenous PRN [name], MD   10 mL at 12/20/15 1436    REVIEW OF SYSTEMS:   Eyes: Denies blurriness of vision, double vision or watery eyes Ears, nose, mouth, throat, and face: Denies mucositis or sore throat Respiratory: Denies cough, dyspnea or wheezes Cardiovascular: Denies palpitation, chest discomfort or lower extremity swelling Skin: Denies abnormal skin rashes Lymphatics: Denies new lymphadenopathy or easy bruising Behavioral/Psych: Mood is stable, no new changes  All  other systems were reviewed with the patient and are negative.  PHYSICAL EXAMINATION: ECOG PERFORMANCE STATUS: 2 - Symptomatic, <50% confined to bed  Vitals:   03/04/16 1127  BP: 116/79  Pulse: 84  Resp: 20  Temp: 99.1 F (37.3 C)   There were no vitals filed for this visit.  GENERAL:alert, no distress and comfortable. She looks pale SKIN: skin color, texture, turgor are normal, no rashes or significant lesions EYES: normal, conjunctiva are pink and non-injected, sclera clear OROPHARYNX:no exudate, no erythema and lips, buccal mucosa, and tongue normal  NECK: supple, thyroid normal size, non-tender, without nodularity LYMPH:  no palpable lymphadenopathy in the cervical, axillary or inguinal LUNGS: clear to auscultation and percussion with normal  breathing effort HEART: regular rate & rhythm and no murmurs and no lower extremity edema ABDOMEN:abdomen soft, non-tender and normal bowel sounds Musculoskeletal:no cyanosis of digits and no clubbing  PSYCH: alert & oriented x 3 with fluent speech NEURO: no focal motor/sensory deficits  LABORATORY DATA:  I have reviewed the data as listed Lab Results  Component Value Date   WBC 4.7 03/04/2016   HGB 11.3 (L) 03/04/2016   HCT 33.4 (L) 03/04/2016   MCV 93.6 03/04/2016   PLT 10 (LL) 03/04/2016    Recent Labs  12/12/15 0345  02/19/16 0720 02/20/16 0750 02/21/16 0435 03/04/16 0933  NA 135  < > 136 137 134* 132*  K 3.7  < > 3.8 3.7 3.6 4.1  CL 104  < > 102 103 102  --   CO2 23  < > 26 26 25  18*  GLUCOSE 86  < > 101* 118* 107* 104  BUN 15  < > 13 10 11  17.4  CREATININE 0.82  < > 0.51 0.85 0.75 0.8  CALCIUM 8.5*  < > 8.2* 8.5* 8.0* 8.9  GFRNONAA >60  < > >60 >60 >60  --   GFRAA >60  < > >60 >60 >60  --   PROT 5.2*  < > 6.3* 6.7  --  5.6*  ALBUMIN 2.6*  < > 2.4* 2.4*  --  2.5*  AST 68*  < > 31 34  --  71*  ALT 28  < > 18 19  --  21  ALKPHOS 205*  < > 250* 305*  --  527*  BILITOT 1.2  < > 1.1 1.1  --  1.53*  BILIDIR 0.3  --   --   --   --   --   IBILI 0.9  --   --   --   --   --   < > = values in this interval not displayed.  RADIOGRAPHIC STUDIES: I have personally reviewed the radiological images as listed and agreed with the findings in the report. Dg Chest 2 View  Result Date: 02/14/2016 CLINICAL DATA:  Shortness of breath, weakness, and chest pain. History of asthma, CHF, EXAM: CHEST  2 VIEW COMPARISON:  Chest x-ray of January 08 2016 FINDINGS: The lungs are mildly hyperinflated with mild hemidiaphragm flattening. There is no focal infiltrate. There is no pleural effusion. The heart and pulmonary vascularity are normal. The power port catheter tip projects over the midportion of the SVC. There is calcification in the wall of the aortic arch. The bony thorax exhibits no  acute abnormality. There is mild multilevel degenerative disc disease of the thoracic spine. IMPRESSION: Mild chronic bronchitic change. No pneumonia, CHF, nor other acute cardiopulmonary abnormality. Thoracic aortic atherosclerosis. Electronically Signed   By: Shanon Brow  Martinique M.D.   On: 02/14/2016 17:28    ASSESSMENT & PLAN:  Acute ITP I suspect she may have bone marrow disease, relapsed lymphoma causing acute ITP and consumption I recommend IVIG treatment and high-dose steroids Now that she is admitted to the hospital, I will hold off platelet transfusion unless she has signs of bleeding After treatment, I recommend bone marrow aspirate and biopsy for further evaluation  Recurrent lymphoma Recent PET CT scan show hyper intense metabolic activity in the bone marrow area She may benefit from bone marrow aspirate and biopsy but I would like to hold off due to severe thrombocytopenia  Anemia of chronic illness She does not need blood transfusion I will give her blood to keep hemoglobin greater than 8  Lymphocytosis She likely have relapse of lymphoma I will order flow cytometry tomorrow for evaluation She may benefit from bone marrow aspirate and biopsy  History of congestive heart failure Continue medical management  Poor oral intake, protein calorie malnutrition We'll start her on high-dose steroids This is likely due to cancer relapse  Significant weakness We'll consult PT and OT tomorrow for safety evaluation  DVT prophylaxis Lovenox on hold due to low platelet count  CODE STATUS Full code  Discharge planning Likely at the end of the week once ITP resolves  All questions were answered. The patient knows to call the clinic with any problems, questions or concerns.    [name], MD 03/04/2016 1:35 PM

## 2016-03-04 NOTE — Progress Notes (Signed)
This encounter was created in error - please disregard.

## 2016-03-05 DIAGNOSIS — D638 Anemia in other chronic diseases classified elsewhere: Secondary | ICD-10-CM

## 2016-03-05 LAB — CBC WITH DIFFERENTIAL/PLATELET
Basophils Absolute: 0.1 10*3/uL (ref 0.0–0.1)
Basophils Relative: 1 %
EOS ABS: 0 10*3/uL (ref 0.0–0.7)
EOS PCT: 0 %
HCT: 28.3 % — ABNORMAL LOW (ref 36.0–46.0)
HEMOGLOBIN: 9.4 g/dL — AB (ref 12.0–15.0)
LYMPHS PCT: 59 %
Lymphs Abs: 3.8 10*3/uL (ref 0.7–4.0)
MCH: 30.8 pg (ref 26.0–34.0)
MCHC: 33.2 g/dL (ref 30.0–36.0)
MCV: 92.8 fL (ref 78.0–100.0)
Monocytes Absolute: 0.9 10*3/uL (ref 0.1–1.0)
Monocytes Relative: 13 %
NEUTROS PCT: 27 %
Neutro Abs: 1.8 10*3/uL (ref 1.7–7.7)
Platelets: 6 10*3/uL — CL (ref 150–400)
RBC: 3.05 MIL/uL — ABNORMAL LOW (ref 3.87–5.11)
RDW: 19.8 % — ABNORMAL HIGH (ref 11.5–15.5)
WBC: 6.6 10*3/uL (ref 4.0–10.5)

## 2016-03-05 MED ORDER — DIPHENHYDRAMINE HCL 25 MG PO CAPS
25.0000 mg | ORAL_CAPSULE | Freq: Once | ORAL | Status: AC
  Administered 2016-03-05: 25 mg via ORAL
  Filled 2016-03-05 (×2): qty 1

## 2016-03-05 MED ORDER — SODIUM CHLORIDE 0.9 % IV SOLN
Freq: Once | INTRAVENOUS | Status: AC
  Administered 2016-03-05: 14:00:00 via INTRAVENOUS

## 2016-03-05 MED ORDER — ACETAMINOPHEN 325 MG PO TABS
650.0000 mg | ORAL_TABLET | Freq: Once | ORAL | Status: AC
  Administered 2016-03-05: 650 mg via ORAL
  Filled 2016-03-05: qty 2

## 2016-03-05 NOTE — Progress Notes (Signed)
Initial Nutrition Assessment  DOCUMENTATION CODES:   Obesity unspecified  INTERVENTION: Ensure Enlive BID. Each bottle provides 350 kcal and 20 grams of protein.    NUTRITION DIAGNOSIS:   Inadequate oral intake related to acute illness as evidenced by per patient/family report.  GOAL:   Patient will meet greater than or equal to 90% of their needs   MONITOR:   PO intake, Supplement acceptance, Labs, Weight trends  REASON FOR ASSESSMENT:   Malnutrition Screening Tool    ASSESSMENT:   Diamond Boyd is a 76 y.o. female with medical history of colon cancer, Hodgkin's lymphoma, follicular B cell lymphoma in remission presents with two-week history of intermittent epistaxis. The patient states that over the last 2-3 days she has had 2 episodes of epistaxis that have lasted for a longer period of time, approximately hours in duration  Patient BMI is 35.9 (obese). Per patient, appetite had decreased PTA due to acute illness. Per patient husband, she was eating 2-3 meals at home, but only eating part of these meals at a time. She would very rarely complete a meal due to lack of appetite. Patient reported no nausea, vomiting or abdominal pain.   Patient reported that the RN was ordering Prednisone to increase her appetite. She reported that her appetite had improved over the past 24 hours. NFPE indicated patient was well nourished but had lower extremity non-pitting edema in both legs. Patient husband contributed this swelling to the increased fluid load she was receiving.  Patient stated her UBW was 230 pounds. Per patient chart, this weight was last recorded 12/27/15. Patient currently weighs 215 lb (97/8 kg). Per chart, patient has had a 9 lb weight loss over the past month (4% weight loss), which is not significant for time frame.  Patient drinks Ensure at home and really likes this supplement. She agreed to drink it BID during her stay at [facility].   Meds Reviewed : Vitamin  D, Decadron, Synthroid, Lopressor  Labs Reviewed: Sodium 132, Albumin 2.5  Diet Order:  Diet regular Room service appropriate? Yes; Fluid consistency: Thin  Skin:  Reviewed, no issues  Last BM:  1/24  Height:   Ht Readings from Last 1 Encounters:  03/04/16 '5\' 5"'$  (1.651 m)    Weight:   Wt Readings from Last 1 Encounters:  03/04/16 215 lb 8 oz (97.8 kg)    Ideal Body Weight:  56.8 kg  BMI:  35.9 Obese  Estimated Nutritional Needs:   Kcal:  1950-2150  Protein:  85-95 grams  Fluid:  1.9-2.1 L/day  EDUCATION NEEDS:   No education needs identified at this time  Juliann Pulse M.S. Nutrition Dietetic Intern

## 2016-03-05 NOTE — Progress Notes (Signed)
Pt active with [facility] for HHPT. Will need resumption order at dc if pt to continue HHPT. CM will continue to follow. Marney Doctor RN,BSN,NCM [phone]

## 2016-03-05 NOTE — Progress Notes (Signed)
CRITICAL VALUE ALERT  Critical value received:  Platelet 6  Date of notification:  03/05/16  Time of notification:  0842  Critical value read back:yes  Nurse who received alert:  Lottie [facility]  MD notified (1st page):  Dr Alvy Bimler  Time of first page:  0850  MD notified (2nd page):  Time of second page:  Responding MD:  Dr Alvy Bimler  Time MD responded:  [phone]

## 2016-03-05 NOTE — Progress Notes (Signed)
Diamond LERETTE   DOB:02-Jan-1941   BM#:[id number]    Subjective: The patient had significant nosebleeds overnight. Denies hematuria or hematochezia. She complained of mild feeling of jittery sensation. Appetite has improved  Objective:  Vitals:   03/04/16 2113 03/05/16 0616  BP: (!) 132/56 120/66  Pulse: 71 72  Resp: 18 16  Temp: 97.6 F (36.4 C) 98 F (36.7 C)     Intake/Output Summary (Last 24 hours) at 03/05/16 0272 Last data filed at 03/04/16 1425  Gross per 24 hour  Intake              240 ml  Output                0 ml  Net              240 ml    GENERAL:alert, no distress and comfortable SKIN: skin colorIs pale, noted significant bruising EYES: normal, Conjunctiva are pink and non-injected, sclera clear OROPHARYNX:no exudate, no erythema and lips, buccal mucosa, and tongue normal. Noted dry blood on her nose  NECK: supple, thyroid normal size, non-tender, without nodularity LYMPH:  no palpable lymphadenopathy in the cervical, axillary or inguinal LUNGS: clear to auscultation and percussion with normal breathing effort HEART: regular rate & rhythm and no murmurs and no lower extremity edema ABDOMEN:abdomen soft, non-tender and normal bowel sounds Musculoskeletal:no cyanosis of digits and no clubbing  NEURO: alert & oriented x 3 with fluent speech, no focal motor/sensory deficits   Labs:  Lab Results  Component Value Date   WBC 6.6 03/05/2016   HGB 9.4 (L) 03/05/2016   HCT 28.3 (L) 03/05/2016   MCV 92.8 03/05/2016   PLT 6 (LL) 03/05/2016   NEUTROABS 1.8 03/05/2016    Lab Results  Component Value Date   NA 132 (L) 03/04/2016   K 4.1 03/04/2016   CL 102 02/21/2016   CO2 18 (L) 03/04/2016   Assessment & Plan:   Acute ITP I suspect she may have bone marrow disease, relapsed lymphoma causing acute ITP and consumption I recommend IVIG treatment and high-dose steroids She has significant nosebleed recently. We discussed some of the risks, benefits, and  alternatives of platelets transfusions. The patient is symptomatic from low platelet counts with bruising/bleeding/at high risk of life-threatening bleeding and the platelet count is critically low.  Some of the side-effects to be expected including risks of transfusion reactions, chills, infection, syndrome of volume overload and risk of hospitalization from various reasons and the patient is willing to proceed and went ahead to sign consent today. She will continue IVIG and steroids  Recurrent lymphoma Recent PET CT scan show hyper intense metabolic activity in the bone marrow area  I will plan to do bone marrow aspirate and biopsy tomorrow  Anemia of chronic illness She does not need blood transfusion I will give her blood to keep hemoglobin greater than 8  Lymphocytosis She likely have relapse of lymphoma I will order flow cytometry tomorrow for evaluation, results pending She may benefit from bone marrow aspirate and biopsy to be done unsedated by myself tomorrow  History of congestive heart failure Continue medical management  Poor oral intake, protein calorie malnutrition We'll start her on high-dose steroids This is likely due to cancer relapse  Significant weakness We'll consult PT and OT tomorrow for safety evaluation  DVT prophylaxis Lovenox on hold due to low platelet count  CODE STATUS Full code  Discharge planning Likely at the end of the week once ITP  resolves  [name], MD 03/05/2016  9:24 AM

## 2016-03-06 LAB — CBC WITH DIFFERENTIAL/PLATELET
BASOS PCT: 1 %
Basophils Absolute: 0.1 10*3/uL (ref 0.0–0.1)
EOS PCT: 0 %
Eosinophils Absolute: 0 10*3/uL (ref 0.0–0.7)
HEMATOCRIT: 27.8 % — AB (ref 36.0–46.0)
HEMOGLOBIN: 9.7 g/dL — AB (ref 12.0–15.0)
Lymphocytes Relative: 25 %
Lymphs Abs: 2.5 10*3/uL (ref 0.7–4.0)
MCH: 32.3 pg (ref 26.0–34.0)
MCHC: 34.9 g/dL (ref 30.0–36.0)
MCV: 92.7 fL (ref 78.0–100.0)
MONOS PCT: 16 %
Monocytes Absolute: 1.6 10*3/uL — ABNORMAL HIGH (ref 0.1–1.0)
NEUTROS PCT: 58 %
Neutro Abs: 5.9 10*3/uL (ref 1.7–7.7)
Platelets: 21 10*3/uL — CL (ref 150–400)
RBC: 3 MIL/uL — AB (ref 3.87–5.11)
RDW: 19.8 % — ABNORMAL HIGH (ref 11.5–15.5)
WBC: 10.1 10*3/uL (ref 4.0–10.5)

## 2016-03-06 LAB — PREPARE PLATELET PHERESIS
Blood Product Expiration Date: 201801262359
ISSUE DATE / TIME: 201801241438
UNIT TYPE AND RH: 6200

## 2016-03-06 MED ORDER — ENSURE ENLIVE PO LIQD
237.0000 mL | Freq: Two times a day (BID) | ORAL | Status: DC
  Administered 2016-03-06 – 2016-03-11 (×6): 237 mL via ORAL
  Administered 2016-03-12: 10:00:00 via ORAL
  Administered 2016-03-13 – 2016-03-18 (×7): 237 mL via ORAL

## 2016-03-06 NOTE — Progress Notes (Signed)
Chief Complaint: Patient was seen in consultation today for bone marrow biopsy at the request of Dr. Alvy Bimler  Referring Physician(s): Dr. [name]  Supervising Physician: Jacqulynn Cadet  Patient Status: [facility] - In-pt  History of Present Illness: Diamond Boyd is a 76 y.o. 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 She needs bone marrow biopsy, attempted by Oncology today, but pt unable to tolerate procedure. IR is requested to do with image guidance and sedation. CT schedule cannot accommodate procedure for tomorrow but is available early am Mon 1/29. This was discussed with Dr. Alvy Bimler who is agreeable to procedure that day. Pt family at bedside.  Past Medical History:  Diagnosis Date  . Anemia    hx blood tx  . Asthma   . Benign essential HTN   . Cancer ([place])    skin cancer- basal cell on arm / colon 2011  . Chronic systolic CHF (congestive heart failure), NYHA class 2 ([place]) 01/2015  . Colon cancer ([place])    2011, s/p surgery, in remission  . Colon polyps   . Degenerative disk disease    Thoracic spine  . Depression   . 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 Generalized weakness 05/11/2015  . GERD (gastroesophageal reflux disease)   . Heart murmur   . hodgkins lymphoma   . Hypothyroidism   . Lymphoma ([place])   . Neuropathy due to chemotherapeutic drug ([place]) 06/19/2014  . NICM (nonischemic cardiomyopathy) ([place]) 01/2015  . Osteoarthritis    hands  . 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 PAF (paroxysmal atrial fibrillation) ([place])   . Peripheral vascular disease ([place])   . Pneumonia     Past Surgical History:  Procedure Laterality Date  . BONE MARROW BIOPSY  05/27/13  . BREAST BIOPSY  1996  . CARDIAC CATHETERIZATION N/A 01/25/2015   Procedure: Left Heart Cath and Coronary Angiography;  Surgeon: Peter M Martinique, MD;  Location: [place] [facility];  Service: Cardiovascular;  Laterality:  N/A;  . CATARACT EXTRACTION, BILATERAL  2016  . CHOLECYSTECTOMY    . COLECTOMY  8/11  . COLONOSCOPY W/ BIOPSIES    . DG BIOPSY LUNG    . LYMPH NODE BIOPSY N/A 11/01/2014   Procedure: MEDIASTINAL LYMPH NODE BIOPSY;  Surgeon: Grace Isaac, MD;  Location: [place];  Service: Thoracic;  Laterality: N/A;  . MEDIASTINOSCOPY N/A 11/01/2014   Procedure: MEDIASTINOSCOPY;  Surgeon: Grace Isaac, MD;  Location: [facility];  Service: Thoracic;  Laterality: N/A;  . PLANTAR FASCIA RELEASE    . port-a-cath insertion    . SINUS ENDO W/FUSION Bilateral 02/20/2016   Procedure: BILATERAL MAXILLARY ANTROSTOMY, BILATERAL ANTERIOR ETHMOIDECTOMY, BILATERAL FRONTAL RECESS EXPLORATION, FUSION IMAGE GUIDANCE;  Surgeon: Melida Quitter, MD;  Location: [place];  Service: ENT;  Laterality: Bilateral;  . SPLENECTOMY     lymphoma  . TENDON RELEASE     Right thumb   . VAGINAL HYSTERECTOMY    . VENTRAL HERNIA REPAIR  1998  . VIDEO BRONCHOSCOPY WITH ENDOBRONCHIAL ULTRASOUND N/A 10/17/2014   Procedure: VIDEO BRONCHOSCOPY WITH ENDOBRONCHIAL ULTRASOUND;  Surgeon: Javier Glazier, MD;  Location: [place];  Service: Thoracic;  Laterality: N/A;  . VIDEO BRONCHOSCOPY WITH ENDOBRONCHIAL ULTRASOUND N/A 11/01/2014   Procedure: VIDEO BRONCHOSCOPY WITH ENDOBRONCHIAL ULTRASOUND;  Surgeon: Grace Isaac, MD;  Location: [facility];  Service: Thoracic;  Laterality: N/A;    Allergies: Achromycin [tetracycline hcl]; Acyclovir and related;  Allopurinol; Astelin [azelastine hcl]; Cephalexin; Codeine; Lisinopril; Meloxicam; Minocycline; Nabumetone; Nyquil [pseudoeph-doxylamine-dm-apap]; Sulfa antibiotics; Zolpidem tartrate; Buspar [buspirone hcl]; and Ciprofloxacin  Medications:  Current Facility-Administered Medications:  .  acetaminophen (TYLENOL) tablet 650 mg, 650 mg, Oral, Q4H PRN, [name], MD .  acyclovir (ZOVIRAX) tablet 400 mg, 400 mg, Oral, Daily, Ni Gorsuch, MD, 400 mg at 03/06/16 1021 .  albuterol (PROVENTIL) (2.5 MG/3ML) 0.083%  nebulizer solution 3 mL, 3 mL, Inhalation, Q6H PRN, [name], MD .  alum & mag hydroxide-simeth (MAALOX/MYLANTA) 200-200-20 MG/5ML suspension 60 mL, 60 mL, Oral, Q4H PRN, [name], MD .  cholecalciferol (VITAMIN D) tablet 1,000 Units, 1,000 Units, Oral, q morning - 10a, [name], MD, 1,000 Units at 03/06/16 1022 .  dexamethasone (DECADRON) tablet 40 mg, 40 mg, Oral, Daily, Ni Gorsuch, MD, 40 mg at 03/06/16 1030 .  estradiol (ESTRACE) tablet 1 mg, 1 mg, Oral, Daily, Ni Gorsuch, MD, 1 mg at 03/06/16 1021 .  feeding supplement (ENSURE ENLIVE) (ENSURE ENLIVE) liquid 237 mL, 237 mL, Oral, BID BM, Ni Gorsuch, MD, 237 mL at 03/06/16 1030 .  FLUoxetine (PROZAC) capsule 20 mg, 20 mg, Oral, Daily, Ni Gorsuch, MD, 20 mg at 03/06/16 1021 .  fluticasone furoate-vilanterol (BREO ELLIPTA) 200-25 MCG/INH 1 puff, 1 puff, Inhalation, QPM, [name], MD, 1 puff at 03/04/16 1814 .  gabapentin (NEURONTIN) capsule 300 mg, 300 mg, Oral, BID, Ni Gorsuch, MD, 300 mg at 03/06/16 1022 .  Immune Globulin 10% (PRIVIGEN) IV infusion 40 g, 400 mg/kg, Intravenous, Q24H, Ni Gorsuch, MD, 40 g at 03/05/16 1234 .  levothyroxine (SYNTHROID, LEVOTHROID) tablet 112 mcg, 112 mcg, Oral, QAC breakfast, [name], MD, 112 mcg at 03/06/16 0849 .  lidocaine-prilocaine (EMLA) cream, , Topical, PRN, [name], MD .  metoprolol tartrate (LOPRESSOR) tablet 12.5 mg, 12.5 mg, Oral, BID, Ni Gorsuch, MD, 12.5 mg at 03/06/16 1022 .  ondansetron (ZOFRAN) tablet 4-8 mg, 4-8 mg, Oral, Q8H PRN **OR** ondansetron (ZOFRAN-ODT) disintegrating tablet 4-8 mg, 4-8 mg, Oral, Q8H PRN **OR** ondansetron (ZOFRAN) injection 4 mg, 4 mg, Intravenous, Q8H PRN **OR** ondansetron (ZOFRAN) 8 mg in sodium chloride 0.9 % 50 mL IVPB, 8 mg, Intravenous, Q8H PRN, Ni Gorsuch, MD .  pantoprazole (PROTONIX) EC tablet 40 mg, 40 mg, Oral, Daily, Ni Gorsuch, MD, 40 mg at 03/06/16 1022 .  senna-docusate (Senokot-S) tablet 1 tablet, 1 tablet, Oral, QHS PRN, [name], MD .   sodium chloride flush (NS) 0.9 % injection 10-40 mL, 10-40 mL, Intracatheter, PRN, [name], MD  Facility-Administered Medications Ordered in Other Encounters:  .  sodium chloride 0.9 % injection 10 mL, 10 mL, Intravenous, PRN, [name], MD, 10 mL at 12/20/15 1436    Family History  Problem Relation Age of Onset  . Coronary artery disease Mother   . Diabetes Mother   . Fibromyalgia Daughter     chronic pain   . COPD Daughter   . Diabetes Brother   . Asthma Daughter   . Rheum arthritis Brother   . Clotting disorder Brother   . Alcohol abuse Sister   . Colon cancer Neg Hx   . Colon polyps Neg Hx   . Stomach cancer Neg Hx   . Rectal cancer Neg Hx   . Ulcerative colitis Neg Hx   . Crohn's disease Neg Hx     Social History   Social History  . Marital status: Married    Spouse name: N/A  . Number of children: 2  . Years of education:  N/A   Occupational History  . Retired  Retired   Social History Main Topics  . Smoking status: Never Smoker  . Smokeless tobacco: Never Used     Comment: Second-hand exposure through father.  . Alcohol use No  . Drug use: No  . Sexual activity: No   Other Topics Concern  . None   Social History Narrative   Originally from [place]. Always lived in [place]. Previously has traveled to [facility], [place] & up [employer] to [place]. No international travel. No pets currently. Remote parakeet exposure with her children. 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    Lives at home with husband. Weakness due to chemo, but otherwise independant        Review of Systems: A 12 point ROS discussed and pertinent positives are indicated in the HPI above.  All other systems are negative.  Review of Systems  Vital Signs: BP 128/61 (BP Location: Right Wrist)   Pulse 79   Temp 97.5 F (36.4 C) (Oral)   Resp 16   LMP 02/10/1970   SpO2 96%   Physical Exam  Constitutional: She is oriented to person, place, and time. She appears well-developed  and well-nourished. No distress.  HENT:  Head: Normocephalic.  Mouth/Throat: Oropharynx is clear and moist.  Neck: Normal range of motion. No JVD present. No tracheal deviation present.  Cardiovascular: Normal rate and regular rhythm.   Pulmonary/Chest: Effort normal and breath sounds normal. No respiratory distress.  Abdominal: Soft. She exhibits no distension. There is no tenderness.  Neurological: She is alert and oriented to person, place, and time.  Skin: Skin is warm and dry.  Psychiatric: She has a normal mood and affect. Judgment normal.    Mallampati Score:  MD Evaluation Airway: WNL Heart: WNL Abdomen: WNL Chest/ Lungs: WNL ASA  Classification: 3 Mallampati/Airway Score: Two  Imaging: Dg Chest 2 View  Result Date: 02/14/2016 CLINICAL DATA:  Shortness of breath, weakness, and chest pain. History of asthma, CHF, EXAM: CHEST  2 VIEW COMPARISON:  Chest x-ray of January 08 2016 FINDINGS: The lungs are mildly hyperinflated with mild hemidiaphragm flattening. There is no focal infiltrate. There is no pleural effusion. The heart and pulmonary vascularity are normal. The power port catheter tip projects over the midportion of the SVC. There is calcification in the wall of the aortic arch. The bony thorax exhibits no acute abnormality. There is mild multilevel degenerative disc disease of the thoracic spine. IMPRESSION: Mild chronic bronchitic change. No pneumonia, CHF, nor other acute cardiopulmonary abnormality. Thoracic aortic atherosclerosis. Electronically Signed   By: David  Martinique M.D.   On: 02/14/2016 17:28    Labs:  CBC:  Recent Labs  02/21/16 0435 03/04/16 0933 03/05/16 0618 03/06/16 0804  WBC 7.6 4.7 6.6 10.1  HGB 9.9* 11.3* 9.4* 9.7*  HCT 30.6* 33.4* 28.3* 27.8*  PLT 50* 10* 6* 21*    COAGS:  Recent Labs  01/06/16 2127 02/14/16 1221 02/15/16 0540 02/15/16 0950  INR 1.18 1.13  --   --   APTT 45* 38* 39* 27.3    BMP:  Recent Labs  02/18/16 0930  02/19/16 0720 02/20/16 0750 02/21/16 0435 03/04/16 0933  NA 131* 136 137 134* 132*  K 3.5 3.8 3.7 3.6 4.1  CL 100* 102 103 102  --   CO2 25 26 26 25  18*  GLUCOSE 166* 101* 118* 107* 104  BUN 11 13 10 11  17.4  CALCIUM 8.1* 8.2* 8.5* 8.0* 8.9  CREATININE 0.67  0.51 0.85 0.75 0.8  GFRNONAA >60 >60 >60 >60  --   GFRAA >60 >60 >60 >60  --     LIVER FUNCTION TESTS:  Recent Labs  02/18/16 0930 02/19/16 0720 02/20/16 0750 03/04/16 0933  BILITOT 1.1 1.1 1.1 1.53*  AST 32 31 34 71*  ALT 18 18 19 21   ALKPHOS 249* 250* 305* 527*  PROT 6.2* 6.3* 6.7 5.6*  ALBUMIN 2.4* 2.4* 2.4* 2.5*    TUMOR MARKERS: No results for input(s): AFPTM, CEA, CA199, CHROMGRNA in the last 8760 hours.  Assessment and Plan: Lymphoma, ITP Plan for CT guided bone marrow biopsy on 1/29, tentative time of 0800. Risks and Benefits discussed with the patient including, but not limited to bleeding, infection, damage to adjacent structures or low yield requiring additional tests. All of the patient's questions were answered, patient is agreeable to proceed. Consent signed and in chart.    Thank you for this interesting consult.  I greatly enjoyed meeting Diamond Boyd and look forward to participating in their care.  A copy of this report was sent to the requesting provider on this date.  Electronically Signed: Ascencion Dike 03/06/2016, 1:18 PM   I spent a total of 20 minutes in face to face in clinical consultation, greater than 50% of which was counseling/coordinating care for bone marrow biopsy

## 2016-03-06 NOTE — Progress Notes (Signed)
Diamond Boyd   DOB:11/05/1940   BV#:[id number]    Subjective: She denies further bleeding since platelet transfusion. She continues to feel weak. Appetite has slightly improved. No recent diarrhea  Objective:  Vitals:   03/05/16 2127 03/06/16 0543  BP: 122/67 128/61  Pulse: 68 79  Resp: 16 16  Temp: 97.4 F (36.3 C) 97.5 F (36.4 C)     Intake/Output Summary (Last 24 hours) at 03/06/16 2800 Last data filed at 03/05/16 1810  Gross per 24 hour  Intake             1000 ml  Output                0 ml  Net             1000 ml    GENERAL:alert, no distress and comfortable SKIN: skin color, texture, turgor are normal, no rashes or significant lesions EYES: normal, Conjunctiva are pink and non-injected, sclera clear OROPHARYNX:no exudate, no erythema and lips, buccal mucosa, and tongue normal  NECK: supple, thyroid normal size, non-tender, without nodularity LYMPH:  no palpable lymphadenopathy in the cervical, axillary or inguinal LUNGS: clear to auscultation and percussion with normal breathing effort HEART: regular rate & rhythm and no murmurs and no lower extremity edema ABDOMEN:abdomen soft, non-tender and normal bowel sounds Musculoskeletal:no cyanosis of digits and no clubbing  NEURO: alert & oriented x 3 with fluent speech, no focal motor/sensory deficits   Labs:  Lab Results  Component Value Date   WBC 10.1 03/06/2016   HGB 9.7 (L) 03/06/2016   HCT 27.8 (L) 03/06/2016   MCV 92.7 03/06/2016   PLT 21 (LL) 03/06/2016   NEUTROABS 5.9 03/06/2016    Lab Results  Component Value Date   NA 132 (L) 03/04/2016   K 4.1 03/04/2016   CL 102 02/21/2016   CO2 18 (L) 03/04/2016    Assessment & Plan:   Acute ITP I suspect she may have bone marrow disease, relapsed lymphoma causing acute ITP and consumption I recommend IVIG treatment and high-dose steroids She has significant nosebleed recently and was transfused with platelets on 03/05/16 She will continue IVIG and  steroids I will continue transfusion if platelet count dropped to less than 10,000 or bleeding  Recurrent lymphoma Recent PET CT scan show hyper intense metabolic activity in the bone marrow area  I attempted bone marrow biopsy but unfortunately due to inability to get into position and morbid obesity, I was not able to get the bone marrow biopsy done today. I plan to order CT-guided bone marrow biopsy tomorrow  Anemia of chronic illness She does not need blood transfusion I will give her blood to keep hemoglobin greater than 8  Lymphocytosis She likely have relapse of lymphoma I will order flow cytometry tomorrow for evaluation, results pending  History of congestive heart failure Continue medical management  Poor oral intake, protein calorie malnutrition We'll start her on high-dose steroids This is likely due to cancer relapse  Significant weakness We'll consult PT and OT tomorrow for safety evaluation  DVT prophylaxis Lovenox on hold due to low platelet count  CODE STATUS Full code  Discharge planning Likely at the end of the week once ITP resolves   [name], MD 03/06/2016  9:25 AM

## 2016-03-06 NOTE — Procedures (Signed)
Bone Marrow Biopsy and Aspiration Procedure Note   Informed consent was obtained and potential risks including bleeding, infection and pain were reviewed with the patient. The patient's name, date of birth, identification, consent and allergies were verified prior to the start of procedure and time out was performed.  The right posterior iliac crest was chosen as the site of biopsy.  The skin was prepped with Betadine solution.   4 cc of 1% lidocaine was used to provide local anaesthesia.   The patient is morbidly obese and have difficulties lying flat on her abdomen. 3 attempts were made but unable to get into location in the posterior iliac crest region. The procedure was abandoned

## 2016-03-06 NOTE — Progress Notes (Signed)
[facility]  Patient Status: Active  [facility] is providing the following services: PT   If patient discharges after hours, please call [phone].   Dominic Pea 03/06/2016, 2:45 PM

## 2016-03-06 NOTE — Progress Notes (Signed)
IR received request for bone marrow biopsy. Schedule can not accommodate this procedure tomorrow as there are multiple CT cases scheduled with anesthesia. Have tried to contact ordering MD multiple times with no response. Please contact IR so we can assist/coordinate scheduling for early next week.  Ascencion Dike PA-C Interventional Radiology 03/06/2016 12:08 PM

## 2016-03-07 LAB — CBC WITH DIFFERENTIAL/PLATELET
BASOS ABS: 0.1 10*3/uL (ref 0.0–0.1)
Basophils Relative: 1 %
Eosinophils Absolute: 0 10*3/uL (ref 0.0–0.7)
Eosinophils Relative: 0 %
HCT: 25.9 % — ABNORMAL LOW (ref 36.0–46.0)
Hemoglobin: 8.9 g/dL — ABNORMAL LOW (ref 12.0–15.0)
LYMPHS ABS: 2.2 10*3/uL (ref 0.7–4.0)
Lymphocytes Relative: 31 %
MCH: 31.9 pg (ref 26.0–34.0)
MCHC: 34.4 g/dL (ref 30.0–36.0)
MCV: 92.8 fL (ref 78.0–100.0)
MONOS PCT: 15 %
Monocytes Absolute: 1.1 10*3/uL — ABNORMAL HIGH (ref 0.1–1.0)
Neutro Abs: 3.8 10*3/uL (ref 1.7–7.7)
Neutrophils Relative %: 53 %
PLATELETS: 12 10*3/uL — AB (ref 150–400)
RBC: 2.79 MIL/uL — AB (ref 3.87–5.11)
RDW: 19.9 % — AB (ref 11.5–15.5)
WBC: 7.2 10*3/uL (ref 4.0–10.5)

## 2016-03-07 LAB — COMPREHENSIVE METABOLIC PANEL
ALBUMIN: 2.2 g/dL — AB (ref 3.5–5.0)
ALT: 48 U/L (ref 14–54)
AST: 80 U/L — ABNORMAL HIGH (ref 15–41)
Alkaline Phosphatase: 274 U/L — ABNORMAL HIGH (ref 38–126)
Anion gap: 4 — ABNORMAL LOW (ref 5–15)
BILIRUBIN TOTAL: 1.3 mg/dL — AB (ref 0.3–1.2)
BUN: 30 mg/dL — ABNORMAL HIGH (ref 6–20)
CHLORIDE: 109 mmol/L (ref 101–111)
CO2: 23 mmol/L (ref 22–32)
Calcium: 8.4 mg/dL — ABNORMAL LOW (ref 8.9–10.3)
Creatinine, Ser: 0.64 mg/dL (ref 0.44–1.00)
GFR calc Af Amer: >60 mL/min (ref >60)
GFR calc non Af Amer: >60 mL/min (ref >60)
GLUCOSE: 162 mg/dL — AB (ref 65–99)
POTASSIUM: 4.1 mmol/L (ref 3.5–5.1)
Sodium: 136 mmol/L (ref 135–145)
Total Protein: 6.3 g/dL — ABNORMAL LOW (ref 6.5–8.1)

## 2016-03-07 MED ORDER — DIPHENHYDRAMINE HCL 25 MG PO CAPS
25.0000 mg | ORAL_CAPSULE | Freq: Once | ORAL | Status: AC
Start: 2016-03-07 — End: 2016-03-07
  Administered 2016-03-07: 25 mg via ORAL
  Filled 2016-03-07: qty 1

## 2016-03-07 MED ORDER — SODIUM CHLORIDE 0.9 % IV SOLN: Freq: Once | INTRAVENOUS | Status: DC

## 2016-03-07 MED ORDER — ACETAMINOPHEN 325 MG PO TABS
650.0000 mg | ORAL_TABLET | Freq: Once | ORAL | Status: AC
  Administered 2016-03-07: 650 mg via ORAL
  Filled 2016-03-07: qty 2

## 2016-03-07 NOTE — Progress Notes (Signed)
[name]   DOB:May 07, 1940   IN#:[id number]    Subjective: She continues to have intermittent nosebleed. She feels weak. Appetite has improved. She denies chest pain or shortness of breath. No recent diarrhea  Objective:  Vitals:   03/06/16 2118 03/07/16 0527  BP: 120/72 140/71  Pulse: 77 66  Resp: 16 16  Temp: 98.1 F (36.7 C) 97.5 F (36.4 C)     Intake/Output Summary (Last 24 hours) at 03/07/16 7096 Last data filed at 03/06/16 1655  Gross per 24 hour  Intake              861 ml  Output                0 ml  Net              861 ml    GENERAL:alert, no distress and comfortable SKIN: skin colorIs pale, texture, turgor are normal, no rashes or significant lesions. Noted skin bruising EYES: normal, Conjunctiva are pale and non-injected, sclera clear OROPHARYNX:no exudate, no erythema and lips, buccal mucosa, and tongue normal  NECK: supple, thyroid normal size, non-tender, without nodularity LYMPH:  no palpable lymphadenopathy in the cervical, axillary or inguinal LUNGS: clear to auscultation and percussion with normal breathing effort HEART: regular rate & rhythm and no murmurs and no lower extremity edema ABDOMEN:abdomen soft, non-tender and normal bowel sounds Musculoskeletal:no cyanosis of digits and no clubbing  NEURO: alert & oriented x 3 with fluent speech, no focal motor/sensory deficits   Labs:  Lab Results  Component Value Date   WBC 7.2 03/07/2016   HGB 8.9 (L) 03/07/2016   HCT 25.9 (L) 03/07/2016   MCV 92.8 03/07/2016   PLT 12 (LL) 03/07/2016   NEUTROABS PENDING 03/07/2016    Lab Results  Component Value Date   NA 132 (L) 03/04/2016   K 4.1 03/04/2016   CL 102 02/21/2016   CO2 18 (L) 03/04/2016    Assessment & Plan:   Acute ITP   I suspect she may have bone marrow disease, relapsed lymphoma causing acute ITP and consumption I recommend IVIG treatment and high-dose steroids She has significant nosebleed recently and was transfused with  platelets on 03/05/16 Her platelet count is low again. I recommend another unit of platelets transfusion today She will continue IVIG and steroids, last dose of IVIG tomorrow  Recurrent lymphoma Recent PET CT scan show hyper intense metabolic activity in the bone marrow area  I attempted bone marrow biopsy but unfortunately due to inability to get into position and morbid obesity, I was not able to get the bone marrow biopsy done today. I plan to order CT-guided bone marrow biopsy on Monday  Anemia of chronic illness She does not need blood transfusion I will give her blood to keep hemoglobin greater than 8  Lymphocytosis She likely have relapse of lymphoma I have ordered flow cytometry tomorrow for evaluation, results pending  History of congestive heart failure Continue medical management  Poor oral intake, protein calorie malnutrition We'll start her on high-dose steroids This is likely due to cancer relapse  Significant weakness We'll consult PT and OT tomorrow for safety evaluation  DVT prophylaxis Lovenox on hold due to low platelet count  CODE STATUS Full code  Discharge planning With her needing transfusion every 2 days, she is not safe for discharge. I recommend holding off discharge until after bone marrow biopsy next week  [name], MD 03/07/2016  6:07 AM

## 2016-03-07 NOTE — Progress Notes (Signed)
PT Cancellation Note  Patient Details Name: Diamond Boyd MRN: [id number] DOB: June 02, 1940   Cancelled Treatment:    Reason Eval/Treat Not Completed: Patient not medically ready (to get IVIG. check back another time.)   Claretha Cooper 03/07/2016, 4:49 PM Tresa Endo PT [phone]

## 2016-03-08 DIAGNOSIS — R609 Edema, unspecified: Secondary | ICD-10-CM

## 2016-03-08 LAB — CBC WITH DIFFERENTIAL/PLATELET
BASOS ABS: 0.1 10*3/uL (ref 0.0–0.1)
Basophils Relative: 1 %
EOS ABS: 0 10*3/uL (ref 0.0–0.7)
Eosinophils Relative: 0 %
HEMATOCRIT: 24.1 % — AB (ref 36.0–46.0)
Hemoglobin: 8 g/dL — ABNORMAL LOW (ref 12.0–15.0)
LYMPHS ABS: 1.4 10*3/uL (ref 0.7–4.0)
Lymphocytes Relative: 24 %
MCH: 31 pg (ref 26.0–34.0)
MCHC: 33.2 g/dL (ref 30.0–36.0)
MCV: 93.4 fL (ref 78.0–100.0)
MONOS PCT: 22 %
Monocytes Absolute: 1.3 10*3/uL — ABNORMAL HIGH (ref 0.1–1.0)
Neutro Abs: 2.9 10*3/uL (ref 1.7–7.7)
Neutrophils Relative %: 53 %
PLATELETS: 23 10*3/uL — AB (ref 150–400)
RBC: 2.58 MIL/uL — AB (ref 3.87–5.11)
RDW: 19.9 % — AB (ref 11.5–15.5)
WBC: 5.7 10*3/uL (ref 4.0–10.5)

## 2016-03-08 LAB — COMPREHENSIVE METABOLIC PANEL
ALBUMIN: 2.2 g/dL — AB (ref 3.5–5.0)
ALT: 90 U/L — ABNORMAL HIGH (ref 14–54)
ANION GAP: 5 (ref 5–15)
AST: 116 U/L — AB (ref 15–41)
Alkaline Phosphatase: 251 U/L — ABNORMAL HIGH (ref 38–126)
BUN: 31 mg/dL — AB (ref 6–20)
CHLORIDE: 107 mmol/L (ref 101–111)
CO2: 25 mmol/L (ref 22–32)
Calcium: 8.5 mg/dL — ABNORMAL LOW (ref 8.9–10.3)
Creatinine, Ser: 0.61 mg/dL (ref 0.44–1.00)
GFR calc Af Amer: >60 mL/min (ref >60)
GFR calc non Af Amer: >60 mL/min (ref >60)
GLUCOSE: 180 mg/dL — AB (ref 65–99)
POTASSIUM: 4.2 mmol/L (ref 3.5–5.1)
SODIUM: 137 mmol/L (ref 135–145)
Total Bilirubin: 1.3 mg/dL — ABNORMAL HIGH (ref 0.3–1.2)
Total Protein: 6.7 g/dL (ref 6.5–8.1)

## 2016-03-08 LAB — PREPARE RBC (CROSSMATCH)

## 2016-03-08 MED ORDER — SODIUM CHLORIDE 0.9 % IV SOLN
Freq: Once | INTRAVENOUS | Status: AC
  Administered 2016-03-08: 12:00:00 via INTRAVENOUS

## 2016-03-08 MED ORDER — ACETAMINOPHEN 325 MG PO TABS
650.0000 mg | ORAL_TABLET | Freq: Once | ORAL | Status: AC
  Administered 2016-03-08: 650 mg via ORAL
  Filled 2016-03-08: qty 2

## 2016-03-08 MED ORDER — PREDNISONE 20 MG PO TABS
20.0000 mg | ORAL_TABLET | Freq: Every day | ORAL | Status: DC
  Administered 2016-03-09 – 2016-03-10 (×2): 20 mg via ORAL
  Filled 2016-03-08 (×2): qty 1

## 2016-03-08 MED ORDER — DIPHENHYDRAMINE HCL 25 MG PO CAPS
25.0000 mg | ORAL_CAPSULE | Freq: Once | ORAL | Status: AC
  Administered 2016-03-08: 25 mg via ORAL
  Filled 2016-03-08: qty 1

## 2016-03-08 MED ORDER — FUROSEMIDE 10 MG/ML IJ SOLN
20.0000 mg | Freq: Once | INTRAMUSCULAR | Status: AC
  Administered 2016-03-08: 20 mg via INTRAVENOUS
  Filled 2016-03-08: qty 2

## 2016-03-08 NOTE — Progress Notes (Signed)
PT Cancellation Note  Patient Details Name: VONDA HARTH MRN: [id number] DOB: 12/04/1940   Cancelled Treatment:    Reason Eval/Treat Not Completed: Patient not medically ready. Nurse in with pt getting transfusion. Pt has walked with nursing earlier and requesting to hold off on PT til tomorrow due to transfusion and breathing/fatigue. Will check back tomorrow.   Tadashi Burkel LUBECK 03/08/2016, 12:25 PM

## 2016-03-08 NOTE — Progress Notes (Signed)
Diamond Boyd   DOB:[birth year]   UU#:[id number]    Subjective: She denies further bleeding. Complained of bilateral lower extremity edema. No further diarrhea. She was able to tolerate sitting on the chair and walk a little bit around the nursing station without significant difficulties. Denies chest pain or shortness of breath. She continues to feel very weak  Objective:  Vitals:   03/07/16 2010 03/08/16 0549  BP: 131/66 136/72  Pulse: 69 80  Resp: 16 16  Temp: 97.4 F (36.3 C) 97.8 F (36.6 C)     Intake/Output Summary (Last 24 hours) at 03/08/16 0825 Last data filed at 03/08/16 0549  Gross per 24 hour  Intake           994.17 ml  Output                0 ml  Net           994.17 ml    GENERAL:alert, no distress and comfortable SKIN: Her skin color looks pale. Noted significant bruising EYES: normal, Conjunctiva are pink and non-injected, sclera clear OROPHARYNX:no exudate, no erythema and lips, buccal mucosa, and tongue normal  NECK: supple, thyroid normal size, non-tender, without nodularity LYMPH:  no palpable lymphadenopathy in the cervical, axillary or inguinal LUNGS: clear to auscultation and percussion with normal breathing effort HEART: regular rate & rhythm and no murmurs with mild bilateral lower extremity edema ABDOMEN:abdomen soft, non-tender and normal bowel sounds Musculoskeletal:no cyanosis of digits and no clubbing  NEURO: alert & oriented x 3 with fluent speech, no focal motor/sensory deficits   Labs:  Lab Results  Component Value Date   WBC 5.7 03/08/2016   HGB 8.0 (L) 03/08/2016   HCT 24.1 (L) 03/08/2016   MCV 93.4 03/08/2016   PLT 23 (LL) 03/08/2016   NEUTROABS 2.9 03/08/2016    Lab Results  Component Value Date   NA 137 03/08/2016   K 4.2 03/08/2016   CL 107 03/08/2016   CO2 25 03/08/2016    Assessment & Plan:   Acute ITP   I suspect she may have bone marrow disease, relapsed lymphoma causing acute ITP and consumption I recommend IVIG  treatment and high-dose steroids She has significant nosebleed recently and was transfused with platelets on 03/05/16 and on 03/07/16 She will continue IVIG and steroids, last dose of IVIG today She will continue prednisone daily  Recurrent lymphoma Recent PET CT scan show hyper intense metabolic activity in the bone marrow area  I attempted bone marrow biopsy but unfortunately due to inability to get into position and morbid obesity, I was not able to get the bone marrow biopsy done today. I plan to order CT-guided bone marrow biopsy on Monday  Anemia of chronic illness She does not need blood transfusion We discussed some of the risks, benefits, and alternatives of blood transfusions. The patient is symptomatic from anemia and the hemoglobin level is critically low.  Some of the side-effects to be expected including risks of transfusion reactions, chills, infection, syndrome of volume overload and risk of hospitalization from various reasons and the patient is willing to proceed and went ahead to sign consent today. I will give HER-2 units of blood  Lymphocytosis She likely have relapse of lymphoma  History of congestive heart failure Continue medical management  Progressive elevated liver enzymes and leg swelling I will order a CT scan of the abdomen for evaluation to exclude liver disease and recurrence of lymphadenopathy that could be causing the leg swelling  Poor oral intake, protein calorie malnutrition We'll start her on high-dose steroids This is likely due to cancer relapse  Significant weakness We'll consult PT and OT tomorrow for safety evaluation  DVT prophylaxis Lovenox on hold due to low platelet count  CODE STATUS Full code  Discharge planning With her needing transfusion every 2 days, she is not safe for discharge. I recommend holding off discharge until after bone marrow biopsy next week   [name], MD 03/08/2016  8:25 AM

## 2016-03-08 NOTE — Progress Notes (Signed)
Pt walked in halls x2 laps this AM, received 1 unit of PRBCs without difficulty. Waiting for lab to irradiate 2nd unit of PRBCs before transfusing 2nd unit.  IV team informed of blood transfusion and that IVIG will be delayed this afternoon after transfusion is completed.

## 2016-03-09 ENCOUNTER — Inpatient Hospital Stay (HOSPITAL_COMMUNITY): Payer: Medicare Other

## 2016-03-09 ENCOUNTER — Encounter (HOSPITAL_COMMUNITY): Payer: Self-pay | Admitting: Radiology

## 2016-03-09 DIAGNOSIS — E8809 Other disorders of plasma-protein metabolism, not elsewhere classified: Secondary | ICD-10-CM

## 2016-03-09 DIAGNOSIS — I509 Heart failure, unspecified: Secondary | ICD-10-CM

## 2016-03-09 DIAGNOSIS — K2960 Other gastritis without bleeding: Secondary | ICD-10-CM

## 2016-03-09 DIAGNOSIS — M7989 Other specified soft tissue disorders: Secondary | ICD-10-CM

## 2016-03-09 DIAGNOSIS — K7689 Other specified diseases of liver: Secondary | ICD-10-CM

## 2016-03-09 LAB — CBC WITH DIFFERENTIAL/PLATELET
BASOS PCT: 1 %
Basophils Absolute: 0 10*3/uL (ref 0.0–0.1)
EOS PCT: 0 %
Eosinophils Absolute: 0 10*3/uL (ref 0.0–0.7)
HCT: 30.3 % — ABNORMAL LOW (ref 36.0–46.0)
HEMOGLOBIN: 10.7 g/dL — AB (ref 12.0–15.0)
LYMPHS ABS: 1.1 10*3/uL (ref 0.7–4.0)
LYMPHS PCT: 28 %
MCH: 31.7 pg (ref 26.0–34.0)
MCHC: 35.3 g/dL (ref 30.0–36.0)
MCV: 89.6 fL (ref 78.0–100.0)
MONOS PCT: 25 %
Monocytes Absolute: 1 10*3/uL (ref 0.1–1.0)
NEUTROS PCT: 46 %
Neutro Abs: 1.8 10*3/uL (ref 1.7–7.7)
Platelets: 12 10*3/uL — CL (ref 150–400)
RBC: 3.38 MIL/uL — ABNORMAL LOW (ref 3.87–5.11)
RDW: 20.1 % — ABNORMAL HIGH (ref 11.5–15.5)
WBC: 3.9 10*3/uL — ABNORMAL LOW (ref 4.0–10.5)
nRBC: 52 /100 WBC — ABNORMAL HIGH

## 2016-03-09 LAB — COMPREHENSIVE METABOLIC PANEL
ALBUMIN: 2.2 g/dL — AB (ref 3.5–5.0)
ALK PHOS: 246 U/L — AB (ref 38–126)
ALT: 118 U/L — AB (ref 14–54)
AST: 127 U/L — ABNORMAL HIGH (ref 15–41)
Anion gap: 5 (ref 5–15)
BUN: 33 mg/dL — ABNORMAL HIGH (ref 6–20)
CALCIUM: 8 mg/dL — AB (ref 8.9–10.3)
CHLORIDE: 103 mmol/L (ref 101–111)
CO2: 26 mmol/L (ref 22–32)
CREATININE: 0.6 mg/dL (ref 0.44–1.00)
GFR calc Af Amer: >60 mL/min (ref >60)
GFR calc non Af Amer: >60 mL/min (ref >60)
GLUCOSE: 171 mg/dL — AB (ref 65–99)
Potassium: 3.9 mmol/L (ref 3.5–5.1)
SODIUM: 134 mmol/L — AB (ref 135–145)
Total Bilirubin: 1.9 mg/dL — ABNORMAL HIGH (ref 0.3–1.2)
Total Protein: 7.4 g/dL (ref 6.5–8.1)

## 2016-03-09 MED ORDER — HEPARIN SOD (PORK) LOCK FLUSH 100 UNIT/ML IV SOLN
500.0000 [IU] | Freq: Every day | INTRAVENOUS | Status: AC | PRN
  Administered 2016-03-18: 500 [IU]

## 2016-03-09 MED ORDER — SODIUM CHLORIDE 0.9% FLUSH: 3.0000 mL | INTRAVENOUS | Status: DC | PRN

## 2016-03-09 MED ORDER — HEPARIN SOD (PORK) LOCK FLUSH 100 UNIT/ML IV SOLN: 250.0000 [IU] | INTRAVENOUS | Status: DC | PRN

## 2016-03-09 MED ORDER — IOPAMIDOL (ISOVUE-300) INJECTION 61%
INTRAVENOUS | Status: AC
  Filled 2016-03-09: qty 100

## 2016-03-09 MED ORDER — SODIUM CHLORIDE 0.9 % IJ SOLN
INTRAMUSCULAR | Status: AC
  Filled 2016-03-09: qty 50

## 2016-03-09 MED ORDER — METHYLPREDNISOLONE SODIUM SUCC 40 MG IJ SOLR
40.0000 mg | Freq: Once | INTRAMUSCULAR | Status: AC
  Administered 2016-03-09: 40 mg via INTRAVENOUS
  Filled 2016-03-09: qty 1

## 2016-03-09 MED ORDER — SODIUM CHLORIDE 0.9% FLUSH: 10.0000 mL | INTRAVENOUS | Status: DC | PRN

## 2016-03-09 MED ORDER — IOPAMIDOL (ISOVUE-300) INJECTION 61%
100.0000 mL | Freq: Once | INTRAVENOUS | Status: AC | PRN
  Administered 2016-03-09: 100 mL via INTRAVENOUS

## 2016-03-09 MED ORDER — IOPAMIDOL (ISOVUE-300) INJECTION 61%
30.0000 mL | Freq: Once | INTRAVENOUS | Status: AC | PRN
  Administered 2016-03-09: 30 mL via ORAL

## 2016-03-09 MED ORDER — SODIUM CHLORIDE 0.9 % IV SOLN
250.0000 mL | Freq: Once | INTRAVENOUS | Status: AC
  Administered 2016-03-09: 250 mL via INTRAVENOUS

## 2016-03-09 NOTE — Progress Notes (Addendum)
Subjective: The patient is seen and examined today. Her husband was at the bedside. She is feeling fine today was no specific complaints except for some fatigue and mild hemorrhoidal bleed. She denied having any fever or chills. She has no nausea or vomiting. She denied having any chest pain, shortness breath, cough or hemoptysis.  Objective: Vital signs in last 24 hours: Temp:  [97.3 F (36.3 C)-98 F (36.7 C)] 97.4 F (36.3 C) (01/28 0500) Pulse Rate:  [61-75] 72 (01/28 0500) Resp:  [16-20] 18 (01/28 0500) BP: (123-147)/(48-80) 147/80 (01/28 0500) SpO2:  [97 %-100 %] 97 % (01/28 0500) Weight:  [215 lb 8 oz (97.8 kg)] 215 lb 8 oz (97.8 kg) (01/27 1320)  Intake/Output from previous day: 01/27 0701 - 01/28 0700 In: 1650 [P.O.:1000; I.V.:50; Blood:600] Out: -  Intake/Output this shift: No intake/output data recorded.  General appearance: alert, cooperative, fatigued and no distress Resp: clear to auscultation bilaterally Cardio: regular rate and rhythm, S1, S2 normal, no murmur, click, rub or gallop GI: soft, non-tender; bowel sounds normal; no masses,  no organomegaly Extremities: edema 1+ bilaterally  Lab Results:   Recent Labs  03/08/16 0551 03/09/16 0348  WBC 5.7 3.9*  HGB 8.0* 10.7*  HCT 24.1* 30.3*  PLT 23* 12*   BMET  Recent Labs  03/08/16 0551 03/09/16 0348  NA 137 134*  K 4.2 3.9  CL 107 103  CO2 25 26  GLUCOSE 180* 171*  BUN 31* 33*  CREATININE 0.61 0.60  CALCIUM 8.5* 8.0*    Studies/Results: No results found.  Medications: I have reviewed the patient's current medications.  Assessment/Plan: 1) recurrent lymphoma. The patient is expected to have repeat bone marrow biopsy and aspirate tomorrow. 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 She is currently undergoing treatment with IVIG and high-dose steroids. She is tolerating the treatment well. Her platelets  count are low today at 12,000. I recommended for the patient to receive 1 unit of platelet transfusion today. 3) generalized weakness: She was evaluated by physical therapy yesterday. 4) hepatic dysfunction: CT of the abdomen showed no clear etiology in the liver. 5) questionable antritis seen on the recent CT scan of the abdomen: We will monitor for now. I plan to start her empirically on Cipro but unfortunately she is allergic to this medication. 6) lower extremity swelling: Probably secondary to her history of congestive heart failure and low albumin.    LOS: 5 days    , K. 03/09/2016    

## 2016-03-09 NOTE — Evaluation (Signed)
Physical Therapy Evaluation Patient Details Name: Diamond Boyd MRN: [id number] DOB: [birth year] Today's Date: 03/09/2016   History of Present Illness  76 yo presented to ER secondary to recurrent epistaxis, sinusitis s/p maxillary exploration. PMhx: non Hodgkin lymphoma, anemia, CHF, colon CA, COPD  Clinical Impression  Pt admitted as above and presenting with functional mobility limitations 2* generalized weakness and ambulatory balance deficits.  Pt should progress to dc home with family assist and follow up HHPT.    Follow Up Recommendations Home health PT    Equipment Recommendations  None recommended by PT    Recommendations for Other Services       Precautions / Restrictions Precautions Precautions: Fall Restrictions Weight Bearing Restrictions: No      Mobility  Bed Mobility               General bed mobility comments: NT, pt up with chair with nursing and requests back to chair  Transfers Overall transfer level: Needs assistance Equipment used: Rolling walker (2 wheeled) Transfers: Sit to/from Stand Sit to Stand: Min guard         General transfer comment: cues for safe transition position and use of UEs to self assist  Ambulation/Gait Ambulation/Gait assistance: Min guard Ambulation Distance (Feet): 400 Feet Assistive device: Rolling walker (2 wheeled) Gait Pattern/deviations: Step-through pattern;Decreased stride length Gait velocity: decr Gait velocity interpretation: Below normal speed for age/gender General Gait Details: cues for posture and position from [employer]            Wheelchair Mobility    Modified Rankin (Stroke Patients Only)       Balance Overall balance assessment: Needs assistance Sitting-balance support: No upper extremity supported;Feet supported Sitting balance-Leahy Scale: Good       Standing balance-Leahy Scale: Fair                               Pertinent Vitals/Pain Pain Assessment:  No/denies pain    Home Living Family/patient expects to be discharged to:: Private residence Living Arrangements: Spouse/significant other Available Help at Discharge: Family Type of Home: House Home Access: Stairs to enter Entrance Stairs-Rails: None Entrance Stairs-Number of Steps: 3 Home Layout: One level Home Equipment: Environmental consultant - 2 wheels;Cane - single point;Bedside commode;Shower seat      Prior Function Level of Independence: Needs assistance   Gait / Transfers Assistance Needed: Pt reports ambulating ltd distance and use of RW to assist  ADL's / Homemaking Assistance Needed: spouse assists  Comments: Indep with household mobility; husband assists with ADLs as needed and "walks with me" when needed.  Denies fall history.     Hand Dominance        Extremity/Trunk Assessment   Upper Extremity Assessment Upper Extremity Assessment: Generalized weakness    Lower Extremity Assessment Lower Extremity Assessment: Generalized weakness    Cervical / Trunk Assessment Cervical / Trunk Assessment: Kyphotic  Communication   Communication: No difficulties  Cognition Arousal/Alertness: Awake/alert Behavior During Therapy: Flat affect Overall Cognitive Status: Within Functional Limits for tasks assessed                      General Comments      Exercises     Assessment/Plan    PT Assessment Patient needs continued PT services  PT Problem List Decreased activity tolerance;Decreased balance;Decreased knowledge of use of DME;Decreased mobility;Decreased strength;Decreased safety awareness;Obesity  PT Treatment Interventions Gait training;Therapeutic exercise;Patient/family education;Stair training;Balance training;Functional mobility training;DME instruction    PT Goals (Current goals can be found in the Care Plan section)  Acute Rehab PT Goals Patient Stated Goal: return home PT Goal Formulation: With patient/family Time For Goal Achievement:  03/20/16 Potential to Achieve Goals: Good    Frequency Min 3X/week   Barriers to discharge        Co-evaluation               End of Session Equipment Utilized During Treatment: Gait belt Activity Tolerance: Patient tolerated treatment well Patient left: in chair;with call bell/phone within reach;with family/visitor present Nurse Communication: Mobility status         Time: 0211-1735 PT Time Calculation (min) (ACUTE ONLY): 28 min   Charges:   PT Evaluation $PT Eval Low Complexity: 1 Procedure PT Treatments $Gait Training: 8-22 mins   PT G Codes:        Diamond Boyd 2016-03-19, 12:54 PM

## 2016-03-10 ENCOUNTER — Inpatient Hospital Stay (HOSPITAL_COMMUNITY): Payer: Medicare Other

## 2016-03-10 DIAGNOSIS — R748 Abnormal levels of other serum enzymes: Secondary | ICD-10-CM

## 2016-03-10 DIAGNOSIS — R933 Abnormal findings on diagnostic imaging of other parts of digestive tract: Secondary | ICD-10-CM

## 2016-03-10 LAB — COMPREHENSIVE METABOLIC PANEL
ALBUMIN: 2.4 g/dL — AB (ref 3.5–5.0)
ALT: 131 U/L — AB (ref 14–54)
ANION GAP: 5 (ref 5–15)
AST: 105 U/L — ABNORMAL HIGH (ref 15–41)
Alkaline Phosphatase: 228 U/L — ABNORMAL HIGH (ref 38–126)
BUN: 29 mg/dL — ABNORMAL HIGH (ref 6–20)
CHLORIDE: 102 mmol/L (ref 101–111)
CO2: 28 mmol/L (ref 22–32)
Calcium: 8.3 mg/dL — ABNORMAL LOW (ref 8.9–10.3)
Creatinine, Ser: 0.68 mg/dL (ref 0.44–1.00)
GFR calc non Af Amer: >60 mL/min (ref >60)
GLUCOSE: 192 mg/dL — AB (ref 65–99)
Potassium: 4 mmol/L (ref 3.5–5.1)
SODIUM: 135 mmol/L (ref 135–145)
Total Bilirubin: 1.8 mg/dL — ABNORMAL HIGH (ref 0.3–1.2)
Total Protein: 6.5 g/dL (ref 6.5–8.1)

## 2016-03-10 LAB — CBC WITH DIFFERENTIAL/PLATELET
BASOS ABS: 0 10*3/uL (ref 0.0–0.1)
BASOS PCT: 1 %
Eosinophils Absolute: 0 10*3/uL (ref 0.0–0.7)
Eosinophils Relative: 0 %
HCT: 31.6 % — ABNORMAL LOW (ref 36.0–46.0)
HEMOGLOBIN: 10.7 g/dL — AB (ref 12.0–15.0)
LYMPHS PCT: 30 %
Lymphs Abs: 0.9 10*3/uL (ref 0.7–4.0)
MCH: 30.7 pg (ref 26.0–34.0)
MCHC: 33.9 g/dL (ref 30.0–36.0)
MCV: 90.5 fL (ref 78.0–100.0)
MONO ABS: 0.8 10*3/uL (ref 0.1–1.0)
Monocytes Relative: 29 %
NEUTROS PCT: 40 %
Neutro Abs: 1.2 10*3/uL — ABNORMAL LOW (ref 1.7–7.7)
PLATELETS: 51 10*3/uL — AB (ref 150–400)
RBC: 3.49 MIL/uL — ABNORMAL LOW (ref 3.87–5.11)
RDW: 20.4 % — ABNORMAL HIGH (ref 11.5–15.5)
WBC: 2.9 10*3/uL — ABNORMAL LOW (ref 4.0–10.5)

## 2016-03-10 LAB — PREPARE PLATELET PHERESIS
BLOOD PRODUCT EXPIRATION DATE: 201801272359
BLOOD PRODUCT EXPIRATION DATE: 201801292359
ISSUE DATE / TIME: 201801261002
ISSUE DATE / TIME: 201801281515
UNIT TYPE AND RH: 6200
Unit Type and Rh: 6200

## 2016-03-10 LAB — TYPE AND SCREEN
Blood Product Expiration Date: 201802172359
Blood Product Expiration Date: 201802242359
ISSUE DATE / TIME: 201801271159
ISSUE DATE / TIME: 201801271437
Unit Type and Rh: 5100
Unit Type and Rh: 5100

## 2016-03-10 LAB — BONE MARROW EXAM

## 2016-03-10 MED ORDER — HYDROCORTISONE ACETATE 25 MG RE SUPP
25.0000 mg | Freq: Two times a day (BID) | RECTAL | Status: DC
  Administered 2016-03-10: 25 mg via RECTAL
  Filled 2016-03-10 (×3): qty 1

## 2016-03-10 MED ORDER — SODIUM CHLORIDE 0.9 % IV SOLN
INTRAVENOUS | Status: AC
  Filled 2016-03-10: qty 250

## 2016-03-10 MED ORDER — FENTANYL CITRATE (PF) 100 MCG/2ML IJ SOLN
INTRAMUSCULAR | Status: AC
  Filled 2016-03-10: qty 4

## 2016-03-10 MED ORDER — FENTANYL CITRATE (PF) 100 MCG/2ML IJ SOLN
INTRAMUSCULAR | Status: AC | PRN
  Administered 2016-03-10: 50 ug via INTRAVENOUS

## 2016-03-10 MED ORDER — MIDAZOLAM HCL 2 MG/2ML IJ SOLN
INTRAMUSCULAR | Status: AC
  Filled 2016-03-10: qty 4

## 2016-03-10 MED ORDER — MIDAZOLAM HCL 2 MG/2ML IJ SOLN
INTRAMUSCULAR | Status: AC | PRN
  Administered 2016-03-10: 1 mg via INTRAVENOUS

## 2016-03-10 NOTE — Consult Note (Addendum)
Consultation  Referring Provider:   Dr. Alvy Bimler    Primary Care Physician:  [name], MD Primary Gastroenterologist:  Dr. Silverio Decamp       Reason for Consultation: Elevated LFT's, Abnormal imaging of abdomen             HPI:   Diamond Boyd is a 77 y.o. 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 We were consultation today in regards to her elevated liver function tests and abnormal imaging of her abdomen.   The patient was previously followed in our office 1 year ago for hemorrhoidal bleeding. At that time she complained of hemorrhoidal bleeding on blood thinner and a flexible sigmoidoscopy was recommended. She did have flexible sigmoidoscopy on 02/22/15 which showed small to medium sized internal hemorrhoids and a normal appearing anastomosis. The etiology of her bright red blood per rectum was fel to be hemorrhoidal and the patient could consider banding if she needs go back on chronic anticoagulation. We have not seen the patient since that time.     Today, the patient tells me that she has been here for a while and has been receiving multiple blood products. She continues with intermittent hemorrhoidal bleeding and also discussed with some epigastric discomfort which she is feeling today. Apparently she has been complaining of some mild shortness of breath and has been sleeping in a recliner. She is scheduled for a bone marrow biopsy later today to check the status of her lymphoma. The patient has few complaints telling me that she is having daily bowel movements and actually is "having a bowel movement every day, urinate". She tells me there is are somewhat soft but "nothing like to see if I had back in November of last year". She has been able to eat and has still passing gas.   Patient denies history of elevated liver enzymes, history  of hepatitis, fatty liver, IV drug use, tattoos or family history of liver disease.  Hospital course: CT abdomen and pelvis with contrast 03/09/16-interval development of wall thickening of the duodenum and small bowel throughout the abdomen with associated mesenteric free fluid and fat stranding, concerning for the possibility of enteritis. No lymphadenopathy. Read demonstrated wall thickening of the colon at the level of the surgical anastomosis, potentially postprocedural in etiology. Mass cannot be excluded.      Past Medical History:  Diagnosis Date  . Anemia    hx blood tx  . Asthma   . Benign essential HTN   . Cancer ([place])    skin cancer- basal cell on arm / colon 2011  . Chronic systolic CHF (congestive heart failure), NYHA class 2 ([place]) 01/2015  . Colon cancer ([place])    2011, s/p surgery, in remission  . Colon polyps   . Degenerative disk disease    Thoracic spine  . Depression   . 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 Generalized weakness 05/11/2015  . GERD (gastroesophageal reflux disease)   . Heart murmur   . hodgkins lymphoma   . Hypothyroidism   . Lymphoma ([place])   . Neuropathy due to chemotherapeutic drug ([place]) 06/19/2014  . NICM (nonischemic cardiomyopathy) ([place]) 01/2015  . Osteoarthritis    hands  . 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 PAF (  paroxysmal atrial fibrillation) ([place])   . Peripheral vascular disease ([place])   . Pneumonia     Past Surgical History:  Procedure Laterality Date  . BONE MARROW BIOPSY  05/27/13  . BREAST BIOPSY  1996  . CARDIAC CATHETERIZATION N/A 01/25/2015   Procedure: Left Heart Cath and Coronary Angiography;  Surgeon: Peter M Martinique, MD;  Location: [facility];  Service: Cardiovascular;  Laterality: N/A;  . CATARACT EXTRACTION, BILATERAL  2016  . CHOLECYSTECTOMY    . COLECTOMY  8/11  . COLONOSCOPY W/ BIOPSIES    . DG BIOPSY LUNG    .  LYMPH NODE BIOPSY N/A 11/01/2014   Procedure: MEDIASTINAL LYMPH NODE BIOPSY;  Surgeon: Grace Isaac, MD;  Location: [facility];  Service: Thoracic;  Laterality: N/A;  . MEDIASTINOSCOPY N/A 11/01/2014   Procedure: MEDIASTINOSCOPY;  Surgeon: Grace Isaac, MD;  Location: [facility];  Service: Thoracic;  Laterality: N/A;  . PLANTAR FASCIA RELEASE    . port-a-cath insertion    . SINUS ENDO W/FUSION Bilateral 02/20/2016   Procedure: BILATERAL MAXILLARY ANTROSTOMY, BILATERAL ANTERIOR ETHMOIDECTOMY, BILATERAL FRONTAL RECESS EXPLORATION, FUSION IMAGE GUIDANCE;  Surgeon: Melida Quitter, MD;  Location: [facility];  Service: ENT;  Laterality: Bilateral;  . SPLENECTOMY     lymphoma  . TENDON RELEASE     Right thumb   . VAGINAL HYSTERECTOMY    . VENTRAL HERNIA REPAIR  1998  . VIDEO BRONCHOSCOPY WITH ENDOBRONCHIAL ULTRASOUND N/A 10/17/2014   Procedure: VIDEO BRONCHOSCOPY WITH ENDOBRONCHIAL ULTRASOUND;  Surgeon: Javier Glazier, MD;  Location: [facility];  Service: Thoracic;  Laterality: N/A;  . VIDEO BRONCHOSCOPY WITH ENDOBRONCHIAL ULTRASOUND N/A 11/01/2014   Procedure: VIDEO BRONCHOSCOPY WITH ENDOBRONCHIAL ULTRASOUND;  Surgeon: Grace Isaac, MD;  Location: [facility];  Service: Thoracic;  Laterality: N/A;    Family History  Problem Relation Age of Onset  . Coronary artery disease Mother   . Diabetes Mother   . Fibromyalgia Daughter     chronic pain   . COPD Daughter   . Diabetes Brother   . Asthma Daughter   . Rheum arthritis Brother   . Clotting disorder Brother   . Alcohol abuse Sister   . Colon cancer Neg Hx   . Colon polyps Neg Hx   . Stomach cancer Neg Hx   . Rectal cancer Neg Hx   . Ulcerative colitis Neg Hx   . Crohn's disease Neg Hx      Social History  Substance Use Topics  . Smoking status: Never Smoker  . Smokeless tobacco: Never Used     Comment: Second-hand exposure through father.  . Alcohol use No    Prior to Admission medications   Medication Sig Start Date End Date Taking?  Authorizing Provider  acetaminophen (TYLENOL) 650 MG CR tablet Take 650 mg by mouth every 8 (eight) hours as needed for pain or fever.    Yes Historical Provider, MD  acyclovir (ZOVIRAX) 400 MG tablet Take 1 tablet (400 mg total) by mouth daily. Patient taking differently: Take 400 mg by mouth every morning.  10/29/15  Yes Abner Greenspan, MD  Albuterol Sulfate (PROAIR RESPICLICK) 294 (90 Base) MCG/ACT AEPB Inhale 1-2 puffs into the lungs every 6 (six) hours as needed (SOB, wheezing).    Yes Historical Provider, MD  BREO ELLIPTA 200-25 MCG/INH AEPB Inhale 1 puff into the lungs every evening.  11/22/15  Yes Historical Provider, MD  Calcium Carbonate-Vitamin D (CALCIUM 600+D) 600-400 MG-UNIT per tablet Take 1 tablet by  mouth every evening.    Yes Historical Provider, MD  Cholecalciferol (VITAMIN D-3) 1000 UNITS CAPS Take 1,000 Units by mouth every morning.    Yes Historical Provider, MD  estradiol (ESTRACE) 1 MG tablet Take 1 tablet (1 mg total) by mouth daily. Patient taking differently: Take 1 mg by mouth every morning.  10/29/15  Yes Marne A Tower, MD  fexofenadine (ALLEGRA) 180 MG tablet TAKE 1 TABLET (180 MG TOTAL) BY MOUTH DAILY. (*OTC) 08/01/15  Yes Historical Provider, MD  FLUoxetine (PROZAC) 20 MG capsule Take 1 capsule (20 mg total) by mouth daily. Patient taking differently: Take 20 mg by mouth every morning.  10/29/15  Yes Abner Greenspan, MD  furosemide (LASIX) 20 MG tablet Take 1 tablet (20 mg total) by mouth daily. Take 1 tab by mouth daily for weight gain. Patient taking differently: Take 20 mg by mouth every morning. Take 1 tab by mouth daily for weight gain. 10/29/15  Yes Abner Greenspan, MD  gabapentin (NEURONTIN) 300 MG capsule Take 1 capsule (300 mg total) by mouth 2 (two) times daily. 10/29/15  Yes [place], MD  Lactobacillus (PROBIOTIC ACIDOPHILUS) CAPS Take 1 capsule by mouth every evening.   Yes Historical Provider, MD  levothyroxine (SYNTHROID, LEVOTHROID) 112 MCG tablet Take 1  tablet (112 mcg total) by mouth daily before breakfast. 10/29/15  Yes Abner Greenspan, MD  lidocaine-prilocaine (EMLA) cream Apply 1 application topically as needed (For port-a-cath.). Reported on 02/22/2015   Yes Historical Provider, MD  metoprolol (LOPRESSOR) 25 MG tablet Take 0.5 tablets (12.5 mg total) by mouth 2 (two) times daily. 02/22/16  Yes Venetia Maxon Rama, MD  Multiple Vitamins-Minerals (MULTIVITAMIN ADULTS PO) Take 1 tablet by mouth every evening.   Yes Historical Provider, MD  Omega-3 350 MG CAPS Take 1 capsule by mouth daily. Reported on 02/22/2015   Yes Historical Provider, MD  omeprazole (PRILOSEC) 40 MG capsule Take 40 mg by mouth at bedtime.  01/01/16  Yes Historical Provider, MD  ondansetron (ZOFRAN) 8 MG tablet Take 8 mg by mouth every 6 (six) hours as needed for nausea or vomiting. Reported on 03/05/2015 12/25/14  Yes Historical Provider, MD  prochlorperazine (COMPAZINE) 10 MG tablet Take 10 mg by mouth every 6 (six) hours as needed for nausea or vomiting. Reported on 03/05/2015 12/25/14  Yes Historical Provider, MD  simvastatin (ZOCOR) 40 MG tablet Take 1 tablet (40 mg total) by mouth at bedtime. 10/29/15  Yes Abner Greenspan, MD  sodium chloride (OCEAN) 0.65 % SOLN nasal spray Place 2 sprays into both nostrils every 2 (two) hours while awake. 02/22/16  Yes Venetia Maxon Rama, MD    Current Facility-Administered Medications  Medication Dose Route Frequency Provider Last Rate Last Dose  . 0.9 %  sodium chloride infusion   Intravenous Once [name], MD      . acetaminophen (TYLENOL) tablet 650 mg  650 mg Oral Q4H PRN [name], MD   650 mg at 03/09/16 2137  . acyclovir (ZOVIRAX) tablet 400 mg  400 mg Oral Daily [name], MD   400 mg at 03/10/16 0843  . albuterol (PROVENTIL) (2.5 MG/3ML) 0.083% nebulizer solution 3 mL  3 mL Inhalation Q6H PRN [name], MD      . alum & mag hydroxide-simeth (MAALOX/MYLANTA) 200-200-20 MG/5ML suspension 60 mL  60 mL Oral Q4H PRN Ni Gorsuch, MD      .  cholecalciferol (VITAMIN D) tablet 1,000 Units  1,000 Units Oral q morning - 10a  [name], MD   1,000 Units at 03/10/16 [phone]  . estradiol (ESTRACE) tablet 1 mg  1 mg Oral Daily [name], MD   1 mg at 03/10/16 0843  . feeding supplement (ENSURE ENLIVE) (ENSURE ENLIVE) liquid 237 mL  237 mL Oral BID BM Ni Gorsuch, MD   237 mL at 03/08/16 1004  . fentaNYL (SUBLIMAZE) 100 MCG/2ML injection           . FLUoxetine (PROZAC) capsule 20 mg  20 mg Oral Daily [name], MD   20 mg at 03/10/16 1000  . fluticasone furoate-vilanterol (BREO ELLIPTA) 200-25 MCG/INH 1 puff  1 puff Inhalation QPM [name], MD   1 puff at 03/09/16 1808  . gabapentin (NEURONTIN) capsule 300 mg  300 mg Oral BID [name], MD   300 mg at 03/10/16 0843  . heparin lock flush 100 unit/mL  500 Units Intracatheter Daily PRN Curt Bears, MD      . heparin lock flush 100 unit/mL  250 Units Intracatheter PRN Curt Bears, MD      . levothyroxine (SYNTHROID, LEVOTHROID) tablet 112 mcg  112 mcg Oral QAC breakfast [name], MD   112 mcg at 03/10/16 0843  . lidocaine-prilocaine (EMLA) cream   Topical PRN [name], MD      . metoprolol tartrate (LOPRESSOR) tablet 12.5 mg  12.5 mg Oral BID [name], MD   12.5 mg at 03/10/16 0844  . midazolam (VERSED) 2 MG/2ML injection           . ondansetron (ZOFRAN) tablet 4-8 mg  4-8 mg Oral Q8H PRN [name], MD       Or  . ondansetron (ZOFRAN-ODT) disintegrating tablet 4-8 mg  4-8 mg Oral Q8H PRN [name], MD       Or  . ondansetron (ZOFRAN) injection 4 mg  4 mg Intravenous Q8H PRN [name], MD       Or  . ondansetron (ZOFRAN) 8 mg in sodium chloride 0.9 % 50 mL IVPB  8 mg Intravenous Q8H PRN Ni Gorsuch, MD      . pantoprazole (PROTONIX) EC tablet 40 mg  40 mg Oral Daily [name], MD   40 mg at 03/10/16 0844  . predniSONE (DELTASONE) tablet 20 mg  20 mg Oral Q breakfast [name], MD   20 mg at 03/10/16 0843  . senna-docusate (Senokot-S) tablet 1 tablet  1 tablet Oral QHS PRN [name], MD      . sodium chloride flush (NS) 0.9 % injection 10 mL  10 mL Intracatheter PRN Curt Bears, MD      . sodium chloride flush (NS) 0.9 % injection 10-40 mL  10-40 mL Intracatheter PRN Ni Gorsuch, MD      . sodium chloride flush (NS) 0.9 % injection 3 mL  3 mL Intracatheter PRN Curt Bears, MD       Facility-Administered Medications Ordered in Other Encounters  Medication Dose Route Frequency Provider Last Rate Last Dose  . sodium chloride 0.9 % injection 10 mL  10 mL Intravenous PRN [name], MD   10 mL at 12/20/15 1436    Allergies as of 03/04/2016 - Review Complete 03/04/2016  Allergen Reaction Noted  . Achromycin [tetracycline hcl] Other (See Comments) 09/27/2010  . Allopurinol Other (See Comments)   . Astelin [azelastine hcl] Other (See Comments) 04/05/2013  . Cephalexin Other (See Comments) 06/25/2006  . Codeine Other (See Comments) 06/25/2006  . Lisinopril Cough 03/19/2015  . Meloxicam Other (  See Comments) 06/25/2006  . Minocycline Other (See Comments) 01/29/2009  . Nabumetone Other (See Comments) 06/25/2006  . Nyquil [pseudoeph-doxylamine-dm-apap] Hives 04/02/2011  . Sulfa antibiotics Other (See Comments) 04/06/2013  . Zolpidem tartrate Other (See Comments) 06/25/2006  . Buspar [buspirone hcl] Other (See Comments) 05/06/2010  . Ciprofloxacin Rash 06/25/2006     Review of Systems:     Constitutional: Positive for weight loss, fevers HEENT: Eyes: No change in vision               Ears, Nose, Throat:  Positive for nosebleeds Skin: No rash Cardiovascular: No chest pain Respiratory: Positive for SOB Gastrointestinal: See HPI and otherwise negative Genitourinary: No dysuria or change in urinary frequency Neurological: No headache Musculoskeletal: No new muscle or joint pain Hematologic: No bruising Psychiatric: Positive for depression   Physical Exam:  Vital signs in last 24 hours: Temp:  [97.6 F (36.4 C)-98.6 F (37 C)] 97.8 F (36.6 C) (01/29  0545) Pulse Rate:  [51-72] 51 (01/29 0545) Resp:  [19-20] 19 (01/29 0545) BP: (130-151)/(53-78) 151/64 (01/29 0545) SpO2:  [96 %-98 %] 96 % (01/29 0545) Last BM Date: 03/08/16 General:   Pleasant overweight caucasian female appears to be in NAD, Well developed, Well nourished, alert and cooperative Head:  Normocephalic and atraumatic. Eyes:   PEERL, EOMI. No icterus. Conjunctiva pink. Ears:  Normal auditory acuity. Neck:  Supple Throat: Oral cavity and pharynx without inflammation, swelling or lesion. Lungs: Respirations even and unlabored. Lungs clear to auscultation bilaterally.   No wheezes, crackles, or rhonchi.  Heart: Normal S1, S2. No MRG. Regular rate and rhythm. No peripheral edema, cyanosis or pallor.  Abdomen:  Soft, nondistended, mild epigastric discomfort. No rebound or guarding. Normal bowel sounds. No appreciable masses or hepatomegaly. Rectal:  Not performed.  Msk:  Symmetrical without gross deformities.  Extremities:  Without edema, no deformity or joint abnormality.  Neurologic: grossly normal neurologically.  Skin:   Dry and intact without significant lesions or rashes. Psychiatric: Oriented to person, place and time. Demonstrates good judgement and reason without abnormal affect or behaviors.   LAB RESULTS:  Recent Labs  03/08/16 0551 03/09/16 0348 03/10/16 0500  WBC 5.7 3.9* 2.9*  HGB 8.0* 10.7* 10.7*  HCT 24.1* 30.3* 31.6*  PLT 23* 12* 51*   BMET  Recent Labs  03/08/16 0551 03/09/16 0348 03/10/16 0500  NA 137 134* 135  K 4.2 3.9 4.0  CL 107 103 102  CO2 _0 GLUCOSE 180* 171* 192*  BUN 31* 33* 29*  CREATININE 0.61 0.60 0.68  CALCIUM 8.5* 8.0* 8.3*   LFT  Recent Labs  03/10/16 0500  PROT 6.5  ALBUMIN 2.4*  AST 105*  ALT 131*  ALKPHOS 228*  BILITOT 1.8*         05:00 1 d ago 2 d ago 3 d ago      Albumin 3.5 - 5.0 g/dL 2.4   2.2   2.2   2.2        AST 15 - 41 U/L 105   127   116   80        ALT 14 - 54 U/L 131   118   90   48         Alkaline Phosphatase 38 - 126 U/L 228   246   251   274        Total Bilirubin 0.3 - 1.2 mg/dL 1.8   1.9   1.3   1.3  3 d ago 6 d ago  2 wk ago   AST 15 - 41 U/L 80   71R    34    ALT 14 - 54 U/L 48  21R   19    Alkaline Phosphatase 38 - 126 U/L 274   527R    305     Total Bilirubin 0.3 - 1.2 mg/dL 1.3   1.53R    1.1     STUDIES: Ct Abdomen Pelvis W Contrast  Result Date: 03/09/2016 CLINICAL DATA:  Patient with progressive elevated liver enzymes and in leg swelling. EXAM: CT ABDOMEN AND PELVIS WITH CONTRAST TECHNIQUE: Multidetector CT imaging of the abdomen and pelvis was performed using the standard protocol following bolus administration of intravenous contrast. CONTRAST:  22m ISOVUE-300 IOPAMIDOL (ISOVUE-300) INJECTION 61%, 1087mISOVUE-300 IOPAMIDOL (ISOVUE-300) INJECTION 61% COMPARISON:  PET-CT 01/17/2016; CT abdomen pelvis 12/11/2015. FINDINGS: Lower chest: Normal heart size. No pericardial effusion. Dependent atelectasis within the bilateral lower lobes. No pleural effusion. Hepatobiliary: Liver is normal in size and contour. No focal hepatic lesion is identified. Patient status post cholecystectomy. Pancreas: Unremarkable Spleen: Surgically absent. Adrenals/Urinary Tract: Adrenal glands are normal. Kidneys enhance symmetrically with contrast. No hydronephrosis. Urinary bladder is unremarkable. Stomach/Bowel: Normal morphology of the stomach. There is suggestion of mild wall thickening of the duodenum and small bowel. No evidence for bowel obstruction. Postsurgical changes status post partial colonic resection. Wall thickening of the colon at the level of the surgical anastomosis (image 20; series 5). Vascular/Lymphatic: Normal caliber abdominal aorta. Peripheral calcified atherosclerotic plaque. No enlarged retroperitoneal or pelvic lymphadenopathy. Reproductive: Uterus surgically absent. Other: Interval development of small amount of free fluid within the pelvis and small bowel  mesentery. Musculoskeletal: Probable hemangioma L2 vertebral body. Unchanged tiny lucencies L3 and L4 are nonspecific. Tumor involvement is not excluded. Lower lumbar spine degenerative changes. Re- demonstrated anterior abdominal wall laxity. IMPRESSION: Interval development of wall thickening of the duodenum and small bowel throughout the abdomen with associated mesenteric free fluid and fat stranding. Findings are concerning for the possibility of enteritis. No lymphadenopathy identified within the abdomen or pelvis. Re- demonstrated wall thickening of the colon at the level of surgical anastomosis, potentially postprocedural in etiology. Recommend attention on follow-up as underlying mass is not entirely excluded. Electronically Signed   By: DrLovey Newcomer.D.   On: 03/09/2016 10:22     Impression / Plan:   Impression: 1. Elevated LFTs: It appears over the past 2 weeks patient's LFTs have increased, see chart above, consider relation to medications versus other? 2. Abnormal CT abdomen pelvis: As above with duodenal wall thickening and mesenteric free fluid with fat stranding; consider enteritis versus other, the patient denies nausea, vomiting or change in bowel habits, she does have mild epigastric discomfort 3. Epigastric discomfort: See above, no heartburn or reflux 4. Thrombocytopenia: consider relation to lymphoma vs possible liver disease?  Plan: 1. Continue supportive measures 2. Will discuss above with Dr. StFuller Planplease await any further recommendations after time of his consultation this afternoon  Thank you for your kind consultation, we will continue to follow.  JeLavone Nianemmon  03/10/2016, 10:12 AM Pager #: [phone]   Attending physician's note   I have taken a history, examined the patient and reviewed the chart. I agree with the Advanced Practitioner's note, impression and recommendations.  * Elevated LFTs: alk phos has been elevated for 6 months, transaminases  elevated for about 3 months-transaminases are increasing. Alk phos could be hepatic or  bone related. Abd/pelvic CT yesterday showed liver, biliary tree and pancreas to be normal, post cholecystectomy.  Possible drug induced, reactive or infiltrative liver disease such as hepatic lymphoma. Send hepatic serologies. Liver biopsy might be necessary to further evaluate if serologies are not helpful.  * Abnormal duodenal and small bowel wall thickening and mesenteric stranding however no symptoms associated with these findings. Possible mild enteritis. Question artifact.  * Small volume hematochezia with bowel movements secondary to hemorrhoids. Anusol supp bid prn. * Colon wall thickening at the anastomosis noted on CT is post surgical-this area was well seen on flex sigmoidoscopy in 02/2015 and colonoscopy in 08/2014.   Lucio Edward, MD [facility] [phone] Mon-Fri 8a-5p [phone] after 5p, weekends, holidays

## 2016-03-10 NOTE — Procedures (Signed)
Interventional Radiology Procedure Note  Procedure: CT guided aspirate and core biopsy of right iliac bone Complications: None Recommendations: - Bedrest supine x 1 hrs - Follow biopsy results  Cyndi Montejano T. Jozee Hammer, M.D Pager:  [phone]   

## 2016-03-10 NOTE — Care Management Important Message (Signed)
Important Message  Patient Details  Name: Diamond Boyd MRN: [id number] Date of Birth: 20-Feb-1940   Medicare Important Message Given:  Yes    Kerin Salen 03/10/2016, 4:28 [place] Message  Patient Details  Name: Diamond Boyd MRN: [id number] Date of Birth: [birth year]   Medicare Important Message Given:  Yes    Kerin Salen 03/10/2016, 4:28 PM

## 2016-03-10 NOTE — Progress Notes (Signed)
Diamond Boyd   DOB:1940/06/30   TK#:[id number]    Subjective: The patient received platelet transfusion yesterday. She continues to have intermittent hemorrhoidal bleeding. She continues to feel weak overall but able to walk with assistance. She complained of mild shortness of breath and has been sleeping on the recliner.  Objective:  Vitals:   03/09/16 2120 03/10/16 0545  BP: (!) 132/53 (!) 151/64  Pulse: 72 (!) 51  Resp: 20 19  Temp: 98.6 F (37 C) 97.8 F (36.6 C)     Intake/Output Summary (Last 24 hours) at 03/10/16 0818 Last data filed at 03/09/16 1834  Gross per 24 hour  Intake             1360 ml  Output                0 ml  Net             1360 ml    GENERAL:alert, no distress and comfortable SKIN: Pale skin, noted bruises EYES: normal, Conjunctiva are pink and non-injected, sclera clear OROPHARYNX:no exudate, no erythema and lips, buccal mucosa, and tongue normal  NECK: supple, thyroid normal size, non-tender, without nodularity LYMPH:  no palpable lymphadenopathy in the cervical, axillary or inguinal LUNGS: clear to auscultation and percussion with normal breathing effort HEART: regular rate & rhythm and no murmurs and no lower extremity edema ABDOMEN:abdomen soft, non-tender and normal bowel sounds Musculoskeletal:no cyanosis of digits and no clubbing  NEURO: alert & oriented x 3 with fluent speech, no focal motor/sensory deficits   Labs:  Lab Results  Component Value Date   WBC 2.9 (L) 03/10/2016   HGB 10.7 (L) 03/10/2016   HCT 31.6 (L) 03/10/2016   MCV 90.5 03/10/2016   PLT 51 (L) 03/10/2016   NEUTROABS 1.2 (L) 03/10/2016    Lab Results  Component Value Date   NA 135 03/10/2016   K 4.0 03/10/2016   CL 102 03/10/2016   CO2 28 03/10/2016    Studies:  Ct Abdomen Pelvis W Contrast  Result Date: 03/09/2016 CLINICAL DATA:  Patient with progressive elevated liver enzymes and in leg swelling. EXAM: CT ABDOMEN AND PELVIS WITH CONTRAST TECHNIQUE:  Multidetector CT imaging of the abdomen and pelvis was performed using the standard protocol following bolus administration of intravenous contrast. CONTRAST:  27m ISOVUE-300 IOPAMIDOL (ISOVUE-300) INJECTION 61%, 1041mISOVUE-300 IOPAMIDOL (ISOVUE-300) INJECTION 61% COMPARISON:  PET-CT 01/17/2016; CT abdomen pelvis 12/11/2015. FINDINGS: Lower chest: Normal heart size. No pericardial effusion. Dependent atelectasis within the bilateral lower lobes. No pleural effusion. Hepatobiliary: Liver is normal in size and contour. No focal hepatic lesion is identified. Patient status post cholecystectomy. Pancreas: Unremarkable Spleen: Surgically absent. Adrenals/Urinary Tract: Adrenal glands are normal. Kidneys enhance symmetrically with contrast. No hydronephrosis. Urinary bladder is unremarkable. Stomach/Bowel: Normal morphology of the stomach. There is suggestion of mild wall thickening of the duodenum and small bowel. No evidence for bowel obstruction. Postsurgical changes status post partial colonic resection. Wall thickening of the colon at the level of the surgical anastomosis (image 20; series 5). Vascular/Lymphatic: Normal caliber abdominal aorta. Peripheral calcified atherosclerotic plaque. No enlarged retroperitoneal or pelvic lymphadenopathy. Reproductive: Uterus surgically absent. Other: Interval development of small amount of free fluid within the pelvis and small bowel mesentery. Musculoskeletal: Probable hemangioma L2 vertebral body. Unchanged tiny lucencies L3 and L4 are nonspecific. Tumor involvement is not excluded. Lower lumbar spine degenerative changes. Re- demonstrated anterior abdominal wall laxity. IMPRESSION: Interval development of wall thickening of the duodenum and small bowel  throughout the abdomen with associated mesenteric free fluid and fat stranding. Findings are concerning for the possibility of enteritis. No lymphadenopathy identified within the abdomen or pelvis. Re- demonstrated wall  thickening of the colon at the level of surgical anastomosis, potentially postprocedural in etiology. Recommend attention on follow-up as underlying mass is not entirely excluded. Electronically Signed   By: Lovey Newcomer M.D.   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 I suspect she may have bone marrow disease, relapsed lymphoma causing acute ITP and consumption I recommend IVIG treatment and high-dose steroids She has significant nosebleed recently and was transfused with platelets on 03/05/16, 03/07/16 and 03/09/16 She has completed high-dose dexamethasone and IVIG treatment from January 23 to 03/08/2016 She will continue prednisone daily  Recurrent lymphoma Recent PET CT scan show hyper intense metabolic activity in the bone marrow area  I attempted bone marrow biopsy but unfortunately due to inability to get into position and morbid obesity, I was not able to get the bone marrow biopsy done by the bedside I plan to order CT-guided bone marrow biopsy today  Anemia of chronic illness She has received 2 units of blood transfusion on 03/08/2016  Lymphocytosis She likely have relapse of lymphoma  History of congestive heart failure Continue medical management  Progressive elevated liver enzymes and leg swelling CT scan of the abdomen did not reveal the cause of abnormal liver enzymes. Incidentally, there is some mesenteric edema I will consult GI for evaluation  Poor oral intake, protein calorie malnutrition We'll start her on high-dose steroids This is likely due to cancer relapse  Significant weakness Continue physical therapy  DVT prophylaxis Lovenox on hold due to low platelet count  CODE STATUS Full code  Discharge planning With her needing transfusion every 2 days, she is not safe for discharge. I recommend holding off discharge until stability is achieved   [name], MD 03/10/2016  8:18 AM

## 2016-03-11 DIAGNOSIS — C8298 Follicular lymphoma, unspecified, lymph nodes of multiple sites: Secondary | ICD-10-CM

## 2016-03-11 LAB — COMPREHENSIVE METABOLIC PANEL
ALT: 116 U/L — AB (ref 14–54)
AST: 83 U/L — AB (ref 15–41)
Albumin: 2.1 g/dL — ABNORMAL LOW (ref 3.5–5.0)
Alkaline Phosphatase: 200 U/L — ABNORMAL HIGH (ref 38–126)
Anion gap: 4 — ABNORMAL LOW (ref 5–15)
BUN: 26 mg/dL — AB (ref 6–20)
CHLORIDE: 104 mmol/L (ref 101–111)
CO2: 29 mmol/L (ref 22–32)
CREATININE: 0.68 mg/dL (ref 0.44–1.00)
Calcium: 8.2 mg/dL — ABNORMAL LOW (ref 8.9–10.3)
GFR calc Af Amer: >60 mL/min (ref >60)
GFR calc non Af Amer: >60 mL/min (ref >60)
Glucose, Bld: 129 mg/dL — ABNORMAL HIGH (ref 65–99)
POTASSIUM: 3.9 mmol/L (ref 3.5–5.1)
SODIUM: 137 mmol/L (ref 135–145)
Total Bilirubin: 2.1 mg/dL — ABNORMAL HIGH (ref 0.3–1.2)
Total Protein: 6 g/dL — ABNORMAL LOW (ref 6.5–8.1)

## 2016-03-11 LAB — HEPATITIS PANEL, ACUTE
HCV Ab: 0.1 s/co ratio (ref 0.0–0.9)
Hep A IgM: NEGATIVE
Hep B C IgM: NEGATIVE
Hepatitis B Surface Ag: NEGATIVE

## 2016-03-11 LAB — BRAIN NATRIURETIC PEPTIDE: B Natriuretic Peptide: 419.9 pg/mL — ABNORMAL HIGH (ref 0.0–100.0)

## 2016-03-11 LAB — CBC WITH DIFFERENTIAL/PLATELET
BASOS ABS: 0 10*3/uL (ref 0.0–0.1)
Basophils Relative: 1 %
EOS ABS: 0 10*3/uL (ref 0.0–0.7)
Eosinophils Relative: 0 %
HCT: 30.2 % — ABNORMAL LOW (ref 36.0–46.0)
Hemoglobin: 10.2 g/dL — ABNORMAL LOW (ref 12.0–15.0)
LYMPHS PCT: 22 %
Lymphs Abs: 0.3 10*3/uL — ABNORMAL LOW (ref 0.7–4.0)
MCH: 30.9 pg (ref 26.0–34.0)
MCHC: 33.8 g/dL (ref 30.0–36.0)
MCV: 91.5 fL (ref 78.0–100.0)
Monocytes Absolute: 0.5 10*3/uL (ref 0.1–1.0)
Monocytes Relative: 31 %
NEUTROS PCT: 46 %
Neutro Abs: 0.7 10*3/uL — ABNORMAL LOW (ref 1.7–7.7)
Platelets: 15 10*3/uL — CL (ref 150–400)
RBC: 3.3 MIL/uL — AB (ref 3.87–5.11)
RDW: 20.6 % — ABNORMAL HIGH (ref 11.5–15.5)
WBC: 1.5 10*3/uL — AB (ref 4.0–10.5)

## 2016-03-11 LAB — ANTINUCLEAR ANTIBODIES, IFA: ANTINUCLEAR ANTIBODIES, IFA: NEGATIVE

## 2016-03-11 LAB — ANTI-SMOOTH MUSCLE ANTIBODY, IGG: F-ACTIN AB IGG: 28 U — AB (ref 0–19)

## 2016-03-11 LAB — MITOCHONDRIAL ANTIBODIES: Mitochondrial M2 Ab, IgG: 19.9 Units (ref 0.0–20.0)

## 2016-03-11 MED ORDER — MIRTAZAPINE 15 MG PO TABS
15.0000 mg | ORAL_TABLET | Freq: Every day | ORAL | Status: DC
  Administered 2016-03-11 – 2016-03-17 (×7): 15 mg via ORAL
  Filled 2016-03-11 (×7): qty 1

## 2016-03-11 MED ORDER — FUROSEMIDE 10 MG/ML IJ SOLN
20.0000 mg | Freq: Two times a day (BID) | INTRAMUSCULAR | Status: DC
  Administered 2016-03-11 – 2016-03-13 (×6): 20 mg via INTRAVENOUS
  Filled 2016-03-11 (×6): qty 2

## 2016-03-11 MED ORDER — FLUOXETINE HCL 10 MG PO CAPS
10.0000 mg | ORAL_CAPSULE | Freq: Every day | ORAL | Status: DC
  Administered 2016-03-11 – 2016-03-18 (×8): 10 mg via ORAL
  Filled 2016-03-11 (×9): qty 1

## 2016-03-11 NOTE — Progress Notes (Signed)
CRITICAL VALUE ALERT  Critical value received:  Platelets 15  Date of notification:  1/30  Time of notification:  0622  Critical value read back:Yes.    Nurse who received alert:  Alisah Grandberry, Bing Neighbors, RN  MD notified (1st page):  Baltazar Najjar  Time of first page:  [phone]  MD notified (2nd page):  Time of second page:  Responding MD:    Time MD responded:

## 2016-03-11 NOTE — Progress Notes (Signed)
Diamond Boyd   DOB:10-08-1940   MW#:[id number]    Subjective: She tolerated recent bone biopsy well. Continues to complain of dyspnea and orthopnea. Weak. The patient denies any recent signs or symptoms of bleeding such as spontaneous epistaxis, hematuria or hematochezia. Mild cough  Objective:  Vitals:   03/10/16 2153 03/11/16 0700  BP: 124/63 124/82  Pulse: 71 72  Resp: 20 20  Temp: 97.7 F (36.5 C) 97.7 F (36.5 C)    No intake or output data in the 24 hours ending 03/11/16 0748  GENERAL:alert, no distress and comfortable SKIN: skin color, texture, turgor are normal, no rashes or significant lesions EYES: normal, Conjunctiva are pink and non-injected, sclera clear OROPHARYNX:no exudate, no erythema and lips, buccal mucosa, and tongue normal  NECK: supple, thyroid normal size, non-tender, without nodularity LYMPH:  no palpable lymphadenopathy in the cervical, axillary or inguinal LUNGS: clear to auscultation and percussion with normal breathing effort HEART: regular rate & rhythm and no murmurs moderate bilateral lower extremity edema ABDOMEN:abdomen soft, non-tender and normal bowel sounds Musculoskeletal:no cyanosis of digits and no clubbing  NEURO: alert & oriented x 3 with fluent speech, no focal motor/sensory deficits   Labs:  Lab Results  Component Value Date   WBC 1.5 (L) 03/11/2016   HGB 10.2 (L) 03/11/2016   HCT 30.2 (L) 03/11/2016   MCV 91.5 03/11/2016   PLT 15 (LL) 03/11/2016   NEUTROABS 0.7 (L) 03/11/2016    Lab Results  Component Value Date   NA 137 03/11/2016   K 3.9 03/11/2016   CL 104 03/11/2016   CO2 29 03/11/2016    Studies:  Ct Biopsy  Result Date: 03/10/2016 CLINICAL DATA:  History of lymphoma and ITP. Bone marrow biopsy required to assess for possible lymphoma recurrence versus other bone marrow process. EXAM: CT GUIDED BONE MARROW ASPIRATION AND BIOPSY ANESTHESIA/SEDATION: Versed 1.0 mg IV, Fentanyl 50 mcg IV Total Moderate Sedation Time:   14 minutes. The patient's level of consciousness and physiologic status were continuously monitored during the procedure by Radiology nursing. PROCEDURE: The procedure risks, benefits, and alternatives were explained to the patient. Questions regarding the procedure were encouraged and answered. The patient understands and consents to the procedure. A time out was performed prior to initiating the procedure. The right gluteal region was prepped with chlorhexidine. Sterile gown and sterile gloves were used for the procedure. Local anesthesia was provided with 1% Lidocaine. Under CT guidance, an 11 gauge On Control bone cutting needle was advanced from a posterior approach into the right iliac bone. Needle positioning was confirmed with CT. Initial non heparinized and heparinized aspirate samples were obtained of bone marrow. Core biopsy was performed via the On Control drill needle. COMPLICATIONS: None FINDINGS: Inspection of initial aspirate did reveal visible particles. Intact core biopsy sample was obtained. IMPRESSION: CT guided bone marrow biopsy of right posterior iliac bone with both aspirate and core samples obtained. Electronically Signed   By: Aletta Edouard M.D.   On: 03/10/2016 13:04   Ct Bone Marrow Biopsy & Aspiration  Result Date: 03/10/2016 CLINICAL DATA:  History of lymphoma and ITP. Bone marrow biopsy required to assess for possible lymphoma recurrence versus other bone marrow process. EXAM: CT GUIDED BONE MARROW ASPIRATION AND BIOPSY ANESTHESIA/SEDATION: Versed 1.0 mg IV, Fentanyl 50 mcg IV Total Moderate Sedation Time:  14 minutes. The patient's level of consciousness and physiologic status were continuously monitored during the procedure by Radiology nursing. PROCEDURE: The procedure risks, benefits, and alternatives were explained to  the patient. Questions regarding the procedure were encouraged and answered. The patient understands and consents to the procedure. A time out was performed  prior to initiating the procedure. The right gluteal region was prepped with chlorhexidine. Sterile gown and sterile gloves were used for the procedure. Local anesthesia was provided with 1% Lidocaine. Under CT guidance, an 11 gauge On Control bone cutting needle was advanced from a posterior approach into the right iliac bone. Needle positioning was confirmed with CT. Initial non heparinized and heparinized aspirate samples were obtained of bone marrow. Core biopsy was performed via the On Control drill needle. COMPLICATIONS: None FINDINGS: Inspection of initial aspirate did reveal visible particles. Intact core biopsy sample was obtained. IMPRESSION: CT guided bone marrow biopsy of right posterior iliac bone with both aspirate and core samples obtained. Electronically Signed   By: Aletta Edouard M.D.   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 Will stop prednisone  Recurrent lymphoma Recent PET CT scan show hyper intense metabolic activity in the bone marrow area  Bone marrow biopsy from 03/10/16 is pending  Pancytopenia She has received 2 units of blood transfusion on 03/08/2016 No further transfusion unless hemoglobin less than 8 Neutropenia could be due to volume overload. Hold off GCSF, recheck tomorrow  Lymphocytosis She likely have relapse of lymphoma  Exarcebation of congestive heart failure with cough and orthopnea Add lasix BID Monitor in & out and daily weight  Progressive elevated liver enzymes and leg swelling CT scan of the abdomen did not reveal the cause  of abnormal liver enzymes. Incidentally, there is some mesenteric edema Appreciate GI for evaluation Could be due to volume overload and CHF  Poor oral intake, protein calorie malnutrition This is likely due to cancer relapse Start remeron  Significant weakness Continue physical therapy  DVT prophylaxis Lovenox on hold due to low platelet count  CODE STATUS Full code  Discharge planning With her needing transfusion every 2 days, she is not safe for discharge. I recommend holding off discharge until stability is achieved   [name], MD 03/11/2016  7:48 AM

## 2016-03-11 NOTE — Progress Notes (Signed)
Progress Note   Subjective  Chief Complaint: Elevated LFT's, Abnormal CT abdomen  Today, the patient tells me that she is doing well. She denies any further abdominal pain. She continues to have bowel movements and pass gas. She denies nausea or vomiting.   Objective   Vital signs in last 24 hours: Temp:  [97.5 F (36.4 C)-97.7 F (36.5 C)] 97.7 F (36.5 C) (01/30 0700) Pulse Rate:  [49-78] 72 (01/30 0700) Resp:  [14-20] 20 (01/30 0700) BP: (124-159)/(63-82) 124/82 (01/30 0700) SpO2:  [95 %-100 %] 98 % (01/30 0700) Last BM Date: 03/10/16 General:  Overweight Caucasian female in NAD Heart:  Regular rate and rhythm; no murmurs Lungs: Respirations even and unlabored, lungs CTA bilaterally Abdomen:  Soft, nontender and nondistended. Normal bowel sounds. Extremities:  Without edema. Neurologic:  Alert and oriented,  grossly normal neurologically. Psych:  Cooperative. Normal mood and affect.  Lab Results:  Recent Labs  03/09/16 0348 03/10/16 0500 03/11/16 0508  WBC 3.9* 2.9* 1.5*  HGB 10.7* 10.7* 10.2*  HCT 30.3* 31.6* 30.2*  PLT 12* 51* 15*   BMET  Recent Labs  03/09/16 0348 03/10/16 0500 03/11/16 0508  NA 134* 135 137  K 3.9 4.0 3.9  CL 103 102 104  CO2 _0 GLUCOSE 171* 192* 129*  BUN 33* 29* 26*  CREATININE 0.60 0.68 0.68  CALCIUM 8.0* 8.3* 8.2*   LFT  Hepatic Function Latest Ref Rng & Units 03/11/2016 03/10/2016 03/09/2016  Total Protein 6.5 - 8.1 g/dL 6.0(L) 6.5 7.4  Albumin 3.5 - 5.0 g/dL 2.1(L) 2.4(L) 2.2(L)  AST 15 - 41 U/L 83(H) 105(H) 127(H)  ALT 14 - 54 U/L 116(H) 131(H) 118(H)  Alk Phosphatase 38 - 126 U/L 200(H) 228(H) 246(H)  Total Bilirubin 0.3 - 1.2 mg/dL 2.1(H) 1.8(H) 1.9(H)  Bilirubin, Direct 0.1 - 0.5 mg/dL - - -  Some recent data might be hidden    Studies/Results: Ct Biopsy  Result Date: 03/10/2016 CLINICAL DATA:  History of lymphoma and ITP. Bone marrow biopsy required to assess for possible lymphoma recurrence versus  other bone marrow process. EXAM: CT GUIDED BONE MARROW ASPIRATION AND BIOPSY ANESTHESIA/SEDATION: Versed 1.0 mg IV, Fentanyl 50 mcg IV Total Moderate Sedation Time:  14 minutes. The patient's level of consciousness and physiologic status were continuously monitored during the procedure by Radiology nursing. PROCEDURE: The procedure risks, benefits, and alternatives were explained to the patient. Questions regarding the procedure were encouraged and answered. The patient understands and consents to the procedure. A time out was performed prior to initiating the procedure. The right gluteal region was prepped with chlorhexidine. Sterile gown and sterile gloves were used for the procedure. Local anesthesia was provided with 1% Lidocaine. Under CT guidance, an 11 gauge On Control bone cutting needle was advanced from a posterior approach into the right iliac bone. Needle positioning was confirmed with CT. Initial non heparinized and heparinized aspirate samples were obtained of bone marrow. Core biopsy was performed via the On Control drill needle. COMPLICATIONS: None FINDINGS: Inspection of initial aspirate did reveal visible particles. Intact core biopsy sample was obtained. IMPRESSION: CT guided bone marrow biopsy of right posterior iliac bone with both aspirate and core samples obtained. Electronically Signed   By: Aletta Edouard M.D.   On: 03/10/2016 13:04   Ct Bone Marrow Biopsy & Aspiration  Result Date: 03/10/2016 CLINICAL DATA:  History of lymphoma and ITP. Bone marrow biopsy required to assess for possible lymphoma recurrence versus other bone marrow  process. EXAM: CT GUIDED BONE MARROW ASPIRATION AND BIOPSY ANESTHESIA/SEDATION: Versed 1.0 mg IV, Fentanyl 50 mcg IV Total Moderate Sedation Time:  14 minutes. The patient's level of consciousness and physiologic status were continuously monitored during the procedure by Radiology nursing. PROCEDURE: The procedure risks, benefits, and alternatives were  explained to the patient. Questions regarding the procedure were encouraged and answered. The patient understands and consents to the procedure. A time out was performed prior to initiating the procedure. The right gluteal region was prepped with chlorhexidine. Sterile gown and sterile gloves were used for the procedure. Local anesthesia was provided with 1% Lidocaine. Under CT guidance, an 11 gauge On Control bone cutting needle was advanced from a posterior approach into the right iliac bone. Needle positioning was confirmed with CT. Initial non heparinized and heparinized aspirate samples were obtained of bone marrow. Core biopsy was performed via the On Control drill needle. COMPLICATIONS: None FINDINGS: Inspection of initial aspirate did reveal visible particles. Intact core biopsy sample was obtained. IMPRESSION: CT guided bone marrow biopsy of right posterior iliac bone with both aspirate and core samples obtained. Electronically Signed   By: Aletta Edouard M.D.   On: 03/10/2016 13:04       Assessment / Plan:    Assessment: 1. Elevated LFTs: Again, it appears alkaline phosphatase has been elevated for 6 months, transaminases elevated for about 3 months, abdomen/pelvic CT on 03/09/16 showed liver, biliary tree and pancreas to be normal, post cholecystectomy, liver enzymes have decreased slightly today; continue to consider possibly drug-induced, reactive or infiltrative liver disease such as hepatic lymphoma, awaiting hepatic serologies, hepatitis panel negative 2. Abnormal duodenal and small bowel wall thickening and mesenteric stranding: Patient continues to have no symptoms, question artifact versus mild enteritis 3. Small volume hematochezia with bowel movements secondary to hemorrhoids: Anusol suppositories twice a day ordered yesterday 4. Colonic wall thickening at the anastomosis noted on CT is postsurgical: This area was well seen on flexible sigmoidoscopy in 02/2015 and colonoscopy in  08/2014  Plan: 1. Awaiting further hepatic serologies, though LFTs are trending downward today 2. Continue supportive measures 3. Please await any further recommendations from Dr. Fuller Plan later this afternoon  Thank you for your kind consultation, we will continue to follow along   LOS: 7 days   Levin Erp  03/11/2016, 9:31 AM  Pager # [phone]     Attending physician's note   I have taken an interval history, reviewed the chart and examined the patient. I agree with the Advanced Practitioner's note, impression and recommendations. LFTs trending lower today. ANA, AMA, viral hepatitis panel all negative. ASMA is positive, unsure if this is significant. Trend LFTs for now. Liver biopsy is probably not safe at this time with ITP and bleeding problems. Await bone marrow biopsy results.    Lucio Edward, MD [facility] [phone] Mon-Fri 8a-5p [phone] after 5p, weekends, holidays

## 2016-03-11 NOTE — Progress Notes (Signed)
Physical Therapy Treatment Patient Details Name: Diamond Boyd MRN: [id number] DOB: [birth year] Today's Date: 03/11/2016    History of Present Illness 76 yo presented to ER secondary to recurrent epistaxis, sinusitis s/p maxillary exploration. PMhx: non Hodgkin lymphoma, anemia, CHF, colon CA, COPD    PT Comments    Pt ambulated 200' with RW today, which is a decrease in overall distance compared to previous visit. She reports feeling weaker today.   Follow Up Recommendations  Home health PT     Equipment Recommendations  None recommended by PT    Recommendations for Other Services       Precautions / Restrictions Precautions Precautions: Fall Restrictions Weight Bearing Restrictions: No    Mobility  Bed Mobility               General bed mobility comments: NT -pt up in chair  Transfers Overall transfer level: Needs assistance Equipment used: Rolling walker (2 wheeled) Transfers: Sit to/from Stand Sit to Stand: Min guard         General transfer comment: cues for safe transition position and use of UEs to self assist  Ambulation/Gait Ambulation/Gait assistance: Min guard Ambulation Distance (Feet): 200 Feet Assistive device: Rolling walker (2 wheeled) Gait Pattern/deviations: Step-through pattern;Decreased stride length Gait velocity: decr   General Gait Details: decreased distance today, pt reports feeling weaker today   Stairs            Wheelchair Mobility    Modified Rankin (Stroke Patients Only)       Balance Overall balance assessment: Needs assistance Sitting-balance support: No upper extremity supported;Feet supported Sitting balance-Leahy Scale: Good       Standing balance-Leahy Scale: Fair                      Cognition Arousal/Alertness: Awake/alert Behavior During Therapy: Flat affect Overall Cognitive Status: Within Functional Limits for tasks assessed                      Exercises General  Exercises - Lower Extremity Ankle Circles/Pumps: AROM;Both;10 reps;Supine    General Comments        Pertinent Vitals/Pain Pain Assessment: No/denies pain    Home Living                      Prior Function            PT Goals (current goals can now be found in the care plan section) Acute Rehab PT Goals Patient Stated Goal: return home PT Goal Formulation: With patient/family Time For Goal Achievement: 03/20/16 Potential to Achieve Goals: Good Progress towards PT goals: Progressing toward goals    Frequency    Min 3X/week      PT Plan Current plan remains appropriate    Co-evaluation             End of Session Equipment Utilized During Treatment: Gait belt Activity Tolerance: Patient tolerated treatment well Patient left: in chair;with call bell/phone within reach;with family/visitor present     Time: 1202-1219 PT Time Calculation (min) (ACUTE ONLY): 17 min  Charges:  $Gait Training: 8-22 mins                    G Codes:      Diamond Boyd 03/11/2016, 12:42 PM [phone]

## 2016-03-12 LAB — COMPREHENSIVE METABOLIC PANEL
ALBUMIN: 2.2 g/dL — AB (ref 3.5–5.0)
ALK PHOS: 236 U/L — AB (ref 38–126)
ALT: 90 U/L — ABNORMAL HIGH (ref 14–54)
ANION GAP: 8 (ref 5–15)
AST: 61 U/L — ABNORMAL HIGH (ref 15–41)
BUN: 25 mg/dL — ABNORMAL HIGH (ref 6–20)
CHLORIDE: 99 mmol/L — AB (ref 101–111)
CO2: 28 mmol/L (ref 22–32)
Calcium: 7.9 mg/dL — ABNORMAL LOW (ref 8.9–10.3)
Creatinine, Ser: 0.71 mg/dL (ref 0.44–1.00)
GFR calc non Af Amer: >60 mL/min (ref >60)
GLUCOSE: 108 mg/dL — AB (ref 65–99)
Potassium: 3.7 mmol/L (ref 3.5–5.1)
SODIUM: 135 mmol/L (ref 135–145)
Total Bilirubin: 2.7 mg/dL — ABNORMAL HIGH (ref 0.3–1.2)
Total Protein: 5.8 g/dL — ABNORMAL LOW (ref 6.5–8.1)

## 2016-03-12 LAB — CBC WITH DIFFERENTIAL/PLATELET
BASOS ABS: 0 10*3/uL (ref 0.0–0.1)
Basophils Relative: 2 %
EOS ABS: 0 10*3/uL (ref 0.0–0.7)
Eosinophils Relative: 0 %
HCT: 32.4 % — ABNORMAL LOW (ref 36.0–46.0)
HEMOGLOBIN: 11.2 g/dL — AB (ref 12.0–15.0)
LYMPHS ABS: 0.4 10*3/uL — AB (ref 0.7–4.0)
LYMPHS PCT: 23 %
MCH: 31.9 pg (ref 26.0–34.0)
MCHC: 34.6 g/dL (ref 30.0–36.0)
MCV: 92.3 fL (ref 78.0–100.0)
MONOS PCT: 10 %
Monocytes Absolute: 0.2 10*3/uL (ref 0.1–1.0)
NEUTROS ABS: 1 10*3/uL — AB (ref 1.7–7.7)
Neutrophils Relative %: 65 %
PLATELETS: 9 10*3/uL — AB (ref 150–400)
RBC: 3.51 MIL/uL — AB (ref 3.87–5.11)
RDW: 20.5 % — ABNORMAL HIGH (ref 11.5–15.5)
WBC: 1.6 10*3/uL — AB (ref 4.0–10.5)

## 2016-03-12 LAB — BRAIN NATRIURETIC PEPTIDE: B Natriuretic Peptide: 132.5 pg/mL — ABNORMAL HIGH (ref 0.0–100.0)

## 2016-03-12 MED ORDER — DIPHENHYDRAMINE HCL 25 MG PO CAPS
25.0000 mg | ORAL_CAPSULE | Freq: Once | ORAL | Status: AC
  Administered 2016-03-12: 25 mg via ORAL
  Filled 2016-03-12: qty 1

## 2016-03-12 MED ORDER — ACETAMINOPHEN 325 MG PO TABS
650.0000 mg | ORAL_TABLET | Freq: Once | ORAL | Status: AC
  Administered 2016-03-12: 650 mg via ORAL
  Filled 2016-03-12: qty 2

## 2016-03-12 MED ORDER — SODIUM CHLORIDE 0.9 % IV SOLN
Freq: Once | INTRAVENOUS | Status: AC
  Administered 2016-03-12: 13:00:00 via INTRAVENOUS

## 2016-03-12 NOTE — Progress Notes (Signed)
Diamond Boyd   DOB:Nov 26, 1940   KV#:[id number]    Subjective: She is very weak. Her leg swelling has improved. She continued to have shortness of breath at rest. The patient denies any recent signs or symptoms of bleeding such as spontaneous epistaxis, hematuria or hematochezia.   Objective:  Vitals:   03/12/16 0627 03/12/16 1400  BP: (!) 140/93 123/64  Pulse: 82 98  Resp: 18 18  Temp: 98.5 F (36.9 C) 98 F (36.7 C)     Intake/Output Summary (Last 24 hours) at 03/12/16 1432 Last data filed at 03/12/16 1426  Gross per 24 hour  Intake               30 ml  Output                0 ml  Net               30 ml    GENERAL:alert, no distress and comfortable SKIN: skin color Is pale with multiple bruises EYES: normal, Conjunctiva are pink and non-injected, sclera clear OROPHARYNX:no exudate, no erythema and lips, buccal mucosa, and tongue normal  NECK: supple, thyroid normal size, non-tender, without nodularity LYMPH:  no palpable lymphadenopathy in the cervical, axillary or inguinal LUNGS: clear to auscultation and percussion with normal breathing effort HEART: regular rate & rhythm and no murmurs with moderate bilateral lower extremity edema ABDOMEN:abdomen soft, non-tender and normal bowel sounds Musculoskeletal:no cyanosis of digits and no clubbing  NEURO: alert & oriented x 3 with fluent speech, no focal motor/sensory deficits   Labs:  Lab Results  Component Value Date   WBC 1.6 (L) 03/12/2016   HGB 11.2 (L) 03/12/2016   HCT 32.4 (L) 03/12/2016   MCV 92.3 03/12/2016   PLT 9 (LL) 03/12/2016   NEUTROABS 1.0 (L) 03/12/2016    Lab Results  Component Value Date   NA 135 03/12/2016   K 3.7 03/12/2016   CL 99 (L) 03/12/2016   CO2 28 03/12/2016  I reviewed the bone marrow biopsy with the pathologist   Assessment & Plan:   Acute ITP, 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 Will stop prednisone This is due to significant bone marrow infiltration  Recurrent lymphoma Recent PET CT scan show hyper intense metabolic activity in the bone marrow area  Bone marrow biopsy from 03/10/16 is reviewed with the pathologist which show greater than 50% bone marrow involvement from Hodgkin lymphoma I discussed with the patient and her husband about palliative chemotherapy Some of the expected risk, benefit, side effects included risk of severe infection, frequent blood transfusion, allergic reaction, etc. The patient is undecided. I will return next day for further discussion  Pancytopenia She has received 2 units of blood transfusion on 03/08/2016 No further transfusion unless hemoglobin less than 8 Neutropenia could be due to volume overload. Hold off GCSF, recheck tomorrow We discussed some of the risks, benefits, and alternatives of platelets transfusions. The patient is symptomatic from low platelet counts with bruising/bleeding/at high risk of life-threatening bleeding and the platelet count is critically low.  Some of the side-effects to be expected including risks of transfusion reactions, chills, infection, syndrome of volume overload and  risk of hospitalization from various reasons and the patient is willing to proceed and went ahead to sign consent today.  Lymphocytosis She likely have relapse of lymphoma  Exarcebation of congestive heart failure with cough and orthopnea Add lasix BID Monitor in & out and daily weight BNP has improved. Continue for now  Progressive elevated liver enzymes and leg swelling CT scan of the abdomen did not reveal the cause of abnormal liver  enzymes. Incidentally, there is some mesenteric edema Appreciate GI for evaluation Could be due to volume overload and CHF  Poor oral intake, protein calorie malnutrition This is likely due to cancer relapse Started remeron  Significant weakness Continue physical therapy  DVT prophylaxis Lovenox on hold due to low platelet count  CODE STATUS Full code  Discharge planning With her needing transfusion every 2 days, she is not safe for discharge. I recommend holding off discharge until stability is achieved   [name], MD 03/12/2016  2:32 PM

## 2016-03-12 NOTE — Progress Notes (Addendum)
Progress Note   Subjective  Chief Complaint: Elevated LFT's  Today, the patient tells me that she had a rough night, but is feeling somewhat better this morning. Her husband tells me that she was so weak that she could hardly make it to the bathroom. She continues to deny any abdominal pain. She continues to have bowel movements and pass gas. She denies any nausea or vomiting.    Objective   Vital signs in last 24 hours: Temp:  [98.5 F (36.9 C)-99.2 F (37.3 C)] 98.5 F (36.9 C) (01/31 0627) Pulse Rate:  [82-96] 82 (01/31 0627) Resp:  [18-20] 18 (01/31 0627) BP: (98-140)/(64-93) 140/93 (01/31 0627) SpO2:  [95 %-99 %] 95 % (01/31 0627) Last BM Date: 03/11/16 General: Overweight Caucasian female in NAD Heart:  Regular rate and rhythm; no murmurs Lungs: Respirations even and unlabored, lungs CTA bilaterally Abdomen:  Soft, nontender and nondistended. Normal bowel sounds. Extremities:  Without edema. Neurologic:  Alert and oriented,  grossly normal neurologically. Psych:  Cooperative. Normal mood and affect.   Lab Results:  Recent Labs  03/10/16 0500 03/11/16 0508 03/12/16 0557  WBC 2.9* 1.5* 1.6*  HGB 10.7* 10.2* 11.2*  HCT 31.6* 30.2* 32.4*  PLT 51* 15* 9*   BMET  Recent Labs  03/10/16 0500 03/11/16 0508 03/12/16 0557  NA 135 137 135  K 4.0 3.9 3.7  CL 102 104 99*  CO2 _0 GLUCOSE 192* 129* 108*  BUN 29* 26* 25*  CREATININE 0.68 0.68 0.71  CALCIUM 8.3* 8.2* 7.9*   Hepatic Function Latest Ref Rng & Units 03/12/2016 03/11/2016 03/10/2016  Total Protein 6.5 - 8.1 g/dL 5.8(L) 6.0(L) 6.5  Albumin 3.5 - 5.0 g/dL 2.2(L) 2.1(L) 2.4(L)  AST 15 - 41 U/L 61(H) 83(H) 105(H)  ALT 14 - 54 U/L 90(H) 116(H) 131(H)  Alk Phosphatase 38 - 126 U/L 236(H) 200(H) 228(H)  Total Bilirubin 0.3 - 1.2 mg/dL 2.7(H) 2.1(H) 1.8(H)  Bilirubin, Direct 0.1 - 0.5 mg/dL - - -  Some recent data might be hidden    PT/INR No results for input(s): LABPROT, INR in the last 72  hours.  Studies/Results: Ct Biopsy  Result Date: 03/10/2016 CLINICAL DATA:  History of lymphoma and ITP. Bone marrow biopsy required to assess for possible lymphoma recurrence versus other bone marrow process. EXAM: CT GUIDED BONE MARROW ASPIRATION AND BIOPSY ANESTHESIA/SEDATION: Versed 1.0 mg IV, Fentanyl 50 mcg IV Total Moderate Sedation Time:  14 minutes. The patient's level of consciousness and physiologic status were continuously monitored during the procedure by Radiology nursing. PROCEDURE: The procedure risks, benefits, and alternatives were explained to the patient. Questions regarding the procedure were encouraged and answered. The patient understands and consents to the procedure. A time out was performed prior to initiating the procedure. The right gluteal region was prepped with chlorhexidine. Sterile gown and sterile gloves were used for the procedure. Local anesthesia was provided with 1% Lidocaine. Under CT guidance, an 11 gauge On Control bone cutting needle was advanced from a posterior approach into the right iliac bone. Needle positioning was confirmed with CT. Initial non heparinized and heparinized aspirate samples were obtained of bone marrow. Core biopsy was performed via the On Control drill needle. COMPLICATIONS: None FINDINGS: Inspection of initial aspirate did reveal visible particles. Intact core biopsy sample was obtained. IMPRESSION: CT guided bone marrow biopsy of right posterior iliac bone with both aspirate and core samples obtained. Electronically Signed   By: Jenness Corner.D.  On: 03/10/2016 13:04   Ct Bone Marrow Biopsy & Aspiration  Result Date: 03/10/2016 CLINICAL DATA:  History of lymphoma and ITP. Bone marrow biopsy required to assess for possible lymphoma recurrence versus other bone marrow process. EXAM: CT GUIDED BONE MARROW ASPIRATION AND BIOPSY ANESTHESIA/SEDATION: Versed 1.0 mg IV, Fentanyl 50 mcg IV Total Moderate Sedation Time:  14 minutes. The patient's  level of consciousness and physiologic status were continuously monitored during the procedure by Radiology nursing. PROCEDURE: The procedure risks, benefits, and alternatives were explained to the patient. Questions regarding the procedure were encouraged and answered. The patient understands and consents to the procedure. A time out was performed prior to initiating the procedure. The right gluteal region was prepped with chlorhexidine. Sterile gown and sterile gloves were used for the procedure. Local anesthesia was provided with 1% Lidocaine. Under CT guidance, an 11 gauge On Control bone cutting needle was advanced from a posterior approach into the right iliac bone. Needle positioning was confirmed with CT. Initial non heparinized and heparinized aspirate samples were obtained of bone marrow. Core biopsy was performed via the On Control drill needle. COMPLICATIONS: None FINDINGS: Inspection of initial aspirate did reveal visible particles. Intact core biopsy sample was obtained. IMPRESSION: CT guided bone marrow biopsy of right posterior iliac bone with both aspirate and core samples obtained. Electronically Signed   By: Aletta Edouard M.D.   On: 03/10/2016 13:04       Assessment / Plan:    Assessment: 1. Elevated LFTs: Today ALT and AST continue to trend downward, alkaline phosphatase has increased from 200-236 overnight and bili increased from 2.1-2.7, again ASMA was positive, unsure if this is significant, liver biopsy thought unsafe at this time with ITP and bleeding problems, await bone marrow biopsy results 2. Abnormal duodenal and small bowel wall thickening mesenteric stranding 3. Small volume hematochezia with bowel movements secondary to hemorrhoids 4. Colonic wall thickening at the anastomosis-postsurgical  Plan: 1. Continue to await results from bone marrow biopsy 2. Continue supportive measures 3. Please await any further recommendations from Dr. Fuller Plan later this  afternoon  Thank you for kind consultation.    LOS: 8 days   Levin Erp  03/12/2016, 10:58 AM  Pager # [phone]     Attending physician's note   I have taken an interval history, reviewed the chart and examined the patient. I agree with the Advanced Practitioner's note, impression and recommendations. Elevated LFTs with alk phos and bili increased and AST/ALT improving. Concerned that she has hepatic congestion secondary to CHF or an infiltrative hepatic process such as lymphoma. Liver biopsy is not safe at this time and may not be necessary. Awaiting bone marrow biopsy results. Trend LFTs. Check PT/INR.  Lucio Edward, MD [facility] [phone] Mon-Fri 8a-5p [phone] after 5p, weekends, holidays

## 2016-03-13 ENCOUNTER — Other Ambulatory Visit: Payer: Self-pay | Admitting: Hematology and Oncology

## 2016-03-13 DIAGNOSIS — C8190 Hodgkin lymphoma, unspecified, unspecified site: Secondary | ICD-10-CM

## 2016-03-13 DIAGNOSIS — Z7189 Other specified counseling: Secondary | ICD-10-CM

## 2016-03-13 DIAGNOSIS — D61818 Other pancytopenia: Secondary | ICD-10-CM

## 2016-03-13 LAB — CBC WITH DIFFERENTIAL/PLATELET
BASOS ABS: 0 10*3/uL (ref 0.0–0.1)
BASOS PCT: 1 %
Eosinophils Absolute: 0 10*3/uL (ref 0.0–0.7)
Eosinophils Relative: 0 %
HEMATOCRIT: 33 % — AB (ref 36.0–46.0)
Hemoglobin: 10.9 g/dL — ABNORMAL LOW (ref 12.0–15.0)
LYMPHS PCT: 46 %
Lymphs Abs: 1 10*3/uL (ref 0.7–4.0)
MCH: 30.3 pg (ref 26.0–34.0)
MCHC: 33 g/dL (ref 30.0–36.0)
MCV: 91.7 fL (ref 78.0–100.0)
MONOS PCT: 8 %
Monocytes Absolute: 0.2 10*3/uL (ref 0.1–1.0)
NEUTROS ABS: 0.9 10*3/uL — AB (ref 1.7–7.7)
Neutrophils Relative %: 45 %
Platelets: 14 10*3/uL — CL (ref 150–400)
RBC: 3.6 MIL/uL — ABNORMAL LOW (ref 3.87–5.11)
RDW: 20.6 % — ABNORMAL HIGH (ref 11.5–15.5)
WBC: 2.1 10*3/uL — ABNORMAL LOW (ref 4.0–10.5)

## 2016-03-13 LAB — PREPARE PLATELET PHERESIS
Blood Product Expiration Date: 201802012359
ISSUE DATE / TIME: 201801311348
Unit Type and Rh: 5100

## 2016-03-13 LAB — COMPREHENSIVE METABOLIC PANEL
ALBUMIN: 2.4 g/dL — AB (ref 3.5–5.0)
ALT: 66 U/L — ABNORMAL HIGH (ref 14–54)
ANION GAP: 10 (ref 5–15)
AST: 48 U/L — ABNORMAL HIGH (ref 15–41)
Alkaline Phosphatase: 282 U/L — ABNORMAL HIGH (ref 38–126)
BUN: 26 mg/dL — AB (ref 6–20)
CALCIUM: 8.4 mg/dL — AB (ref 8.9–10.3)
CO2: 29 mmol/L (ref 22–32)
Chloride: 97 mmol/L — ABNORMAL LOW (ref 101–111)
Creatinine, Ser: 0.76 mg/dL (ref 0.44–1.00)
GFR calc Af Amer: >60 mL/min (ref >60)
GFR calc non Af Amer: >60 mL/min (ref >60)
GLUCOSE: 119 mg/dL — AB (ref 65–99)
Potassium: 3.4 mmol/L — ABNORMAL LOW (ref 3.5–5.1)
Sodium: 136 mmol/L (ref 135–145)
TOTAL PROTEIN: 6.1 g/dL — AB (ref 6.5–8.1)
Total Bilirubin: 2.5 mg/dL — ABNORMAL HIGH (ref 0.3–1.2)

## 2016-03-13 LAB — PROTIME-INR
INR: 1.22
Prothrombin Time: 15.5 seconds — ABNORMAL HIGH (ref 11.4–15.2)

## 2016-03-13 MED ORDER — SODIUM CHLORIDE 0.9% FLUSH: 10.0000 mL | INTRAVENOUS | Status: DC | PRN

## 2016-03-13 MED ORDER — ALTEPLASE 2 MG IJ SOLR: 2.0000 mg | Freq: Once | INTRAMUSCULAR | Status: DC | PRN

## 2016-03-13 MED ORDER — EPINEPHRINE PF 1 MG/10ML IJ SOSY
0.2500 mg | PREFILLED_SYRINGE | Freq: Once | INTRAMUSCULAR | Status: DC | PRN
Start: 2016-03-13 — End: 2016-03-18

## 2016-03-13 MED ORDER — EPINEPHRINE PF 1 MG/10ML IJ SOSY: 0.2500 mg | PREFILLED_SYRINGE | Freq: Once | INTRAMUSCULAR | Status: DC | PRN

## 2016-03-13 MED ORDER — HEPARIN SOD (PORK) LOCK FLUSH 100 UNIT/ML IV SOLN
500.0000 [IU] | Freq: Once | INTRAVENOUS | Status: DC | PRN
Start: 2016-03-13 — End: 2016-03-18
  Filled 2016-03-13: qty 5

## 2016-03-13 MED ORDER — METHYLPREDNISOLONE SODIUM SUCC 125 MG IJ SOLR: 125.0000 mg | Freq: Once | INTRAMUSCULAR | Status: DC | PRN

## 2016-03-13 MED ORDER — SODIUM CHLORIDE 0.9 % IV SOLN
1.2000 mg/kg | Freq: Once | INTRAVENOUS | Status: AC
  Administered 2016-03-13: 115 mg via INTRAVENOUS
  Filled 2016-03-13: qty 23

## 2016-03-13 MED ORDER — DIPHENHYDRAMINE HCL 50 MG/ML IJ SOLN
50.0000 mg | Freq: Once | INTRAMUSCULAR | Status: AC
  Administered 2016-03-13: 50 mg via INTRAVENOUS
  Filled 2016-03-13: qty 1

## 2016-03-13 MED ORDER — DEXAMETHASONE SODIUM PHOSPHATE 100 MG/10ML IJ SOLN
10.0000 mg | Freq: Once | INTRAMUSCULAR | Status: AC
  Administered 2016-03-13: 10 mg via INTRAVENOUS
  Filled 2016-03-13: qty 1

## 2016-03-13 MED ORDER — DIPHENHYDRAMINE HCL 50 MG/ML IJ SOLN: 50.0000 mg | Freq: Once | INTRAMUSCULAR | Status: DC | PRN

## 2016-03-13 MED ORDER — EPINEPHRINE PF 1 MG/ML IJ SOLN
0.5000 mg | Freq: Once | INTRAMUSCULAR | Status: DC | PRN
  Filled 2016-03-13: qty 1

## 2016-03-13 MED ORDER — SODIUM CHLORIDE 0.9% FLUSH: 3.0000 mL | INTRAVENOUS | Status: DC | PRN

## 2016-03-13 MED ORDER — DIPHENHYDRAMINE HCL 50 MG/ML IJ SOLN: 25.0000 mg | Freq: Once | INTRAMUSCULAR | Status: DC | PRN

## 2016-03-13 MED ORDER — HEPARIN SOD (PORK) LOCK FLUSH 100 UNIT/ML IV SOLN: 250.0000 [IU] | Freq: Once | INTRAVENOUS | Status: DC | PRN

## 2016-03-13 MED ORDER — ALBUTEROL SULFATE (2.5 MG/3ML) 0.083% IN NEBU: 2.5000 mg | INHALATION_SOLUTION | Freq: Once | RESPIRATORY_TRACT | Status: DC | PRN

## 2016-03-13 MED ORDER — ACETAMINOPHEN 325 MG PO TABS
650.0000 mg | ORAL_TABLET | Freq: Once | ORAL | Status: AC
  Administered 2016-03-13: 650 mg via ORAL
  Filled 2016-03-13: qty 2

## 2016-03-13 MED ORDER — SODIUM CHLORIDE 0.9 % IV SOLN: Freq: Once | INTRAVENOUS | Status: DC | PRN

## 2016-03-13 MED ORDER — FAMOTIDINE IN NACL 20-0.9 MG/50ML-% IV SOLN
20.0000 mg | Freq: Once | INTRAVENOUS | Status: DC | PRN
  Filled 2016-03-13: qty 50

## 2016-03-13 MED ORDER — SODIUM CHLORIDE 0.9 % IV SOLN: Freq: Once | INTRAVENOUS | Status: DC

## 2016-03-13 NOTE — Progress Notes (Signed)
Nutrition Follow-up  DOCUMENTATION CODES:   Obesity unspecified  INTERVENTION:  - Continue Ensure Enlive BID, each supplement provides 350 calories and 20 grams protein   NUTRITION DIAGNOSIS:   Inadequate oral intake related to acute illness as evidenced by per patient/family report.  Ongoing  GOAL:   Patient will meet greater than or equal to 90% of their needs  Progressing  MONITOR:   PO intake, Supplement acceptance, Labs, Weight trends    ASSESSMENT:   TIFFANIE BLASSINGAME is a 76 y.o. female with medical history of colon cancer, Hodgkin's lymphoma, follicular B cell lymphoma in remission presents with two-week history of intermittent epistaxis. The patient states that over the last 2-3 days she has had 2 episodes of epistaxis that have lasted for a longer period of time, approximately hours in duration  Pt reports increase in appetite since initial assessment. Per meal consumption records, pt has been consuming between 75-100% of all meals. Pt reports consuming oatmeal, applesauce and two cartons of milk thus far today.  Pt family at bedside asked if they could bring in kiwi for patient, stating she really likes it.   Per nutrition focus physical exam pt showed no fat depletion, no muscle depletion, and mild edema.   Pt reports consuming Ensure supplements but trying to consume as much as she can of her meals first.  Diet Order:  Diet regular Room service appropriate? Yes; Fluid consistency: Thin  Skin:  Reviewed, no issues  Last BM:  2/1  Height:   Ht Readings from Last 1 Encounters:  03/08/16 '5\' 5"'$  (1.651 m)    Weight:   Wt Readings from Last 1 Encounters:  03/08/16 215 lb 8 oz (97.8 kg)    Ideal Body Weight:  56.8 kg  BMI:  Body mass index is 35.86 kg/m.  Estimated Nutritional Needs:   Kcal:  1950-2150  Protein:  85-95 grams  Fluid:  1.9-2.1 L/day  EDUCATION NEEDS:   No education needs identified at this time  [employer] Ranger Dietetic  Intern

## 2016-03-13 NOTE — Progress Notes (Addendum)
PHARMACY NOTE  Adcetris (brentuximab vedotin) is formulary-restricted to outpatient use due to lack of inpatient reimbursement.   However, discussed with Dr. Alvy Bimler, who states inpatient treatment with [facility] is medically necessary in this patient.   Will proceed with Adcetris today as ordered.  Clayburn Pert, PharmD, BCPS Pager: [phone] 03/13/2016  10:50 AM

## 2016-03-13 NOTE — Progress Notes (Signed)
Patient on plan of care prior to pathways. 

## 2016-03-13 NOTE — Progress Notes (Signed)
START ON PATHWAY REGIMEN - Lymphoma and CLL  [id number]: Brentuximab 1.8 mg/kg q3 Weeks (Treat Until Maximum Benefit or Unacceptable Toxicity)   A cycle is every 3 weeks:     Brentuximab vedotin (Adcetris(R)) 1.8 mg/kg Give in 100 mL NS over 30 minutes every three weeks.   Dose for patients above 100 kg should be calculated for 100 kg. Dose Mod: None Additional Orders: Peripheral neuropathy requires dosing adjustment.  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

## 2016-03-13 NOTE — Progress Notes (Signed)
LCSWA informed RN to consult chaplain for Advanced Directives.

## 2016-03-13 NOTE — Progress Notes (Signed)
We discussed goals of care in addition to reviewing plan for chemotherapy. Total contact time with the patient today including counseling is 45 minutes and spent over 75 minutes on coordination of care

## 2016-03-13 NOTE — Progress Notes (Signed)
Diamond Boyd   DOB:[birth year]   IH#:[id number]    Subjective: The patient is seen along with her husband, daughter and her grandson. She continues to feel weak. Leg swelling has improved. She denies chest pain.The patient denies any recent signs or symptoms of bleeding such as spontaneous epistaxis, hematuria or hematochezia. She denies diarrhea or constipation.  Objective:  Vitals:   03/12/16 2207 03/13/16 0635  BP: 118/77 (!) 142/45  Pulse: (!) 103 98  Resp: 16 16  Temp: 98.6 F (37 C) 97.8 F (36.6 C)     Intake/Output Summary (Last 24 hours) at 03/13/16 0749 Last data filed at 03/13/16 8756  Gross per 24 hour  Intake              990 ml  Output                0 ml  Net              990 ml    GENERAL:alert, no distress and comfortable SKIN: skin color is pale, texture, turgor are normal, no rashes or significant lesions, noted bruising EYES: normal, Conjunctiva are pink and non-injected, sclera clear OROPHARYNX:no exudate, no erythema and lips, buccal mucosa, and tongue normal  NECK: supple, thyroid normal size, non-tender, without nodularity LYMPH:  no palpable lymphadenopathy in the cervical, axillary or inguinal LUNGS: clear to auscultation and percussion with normal breathing effort HEART: regular rate & rhythm and no murmurs and no lower extremity edema ABDOMEN:abdomen soft, non-tender and normal bowel sounds Musculoskeletal:no cyanosis of digits and no clubbing  NEURO: alert & oriented x 3 with fluent speech, no focal motor/sensory deficits   Labs:  Lab Results  Component Value Date   WBC 2.1 (L) 03/13/2016   HGB 10.9 (L) 03/13/2016   HCT 33.0 (L) 03/13/2016   MCV 91.7 03/13/2016   PLT 14 (LL) 03/13/2016   NEUTROABS PENDING 03/13/2016    Lab Results  Component Value Date   NA 136 03/13/2016   K 3.4 (L) 03/13/2016   CL 97 (L) 03/13/2016   CO2 29 03/13/2016   Assessment & Plan:   Recurrent classical Hodgkin lymphoma Recent PET CT scan show hyper  intense metabolic activity in the bone marrow area  Bone marrow biopsy from 03/10/16 is reviewed with the pathologist which show greater than 50% bone marrow involvement from Hodgkin lymphoma I discussed with the patient and her husband about palliative chemotherapy  The decision was made based on NCCN guidelines Role of treatment is palliative. It is based on publication below:  J Clin Oncol. 2012 Jun 20;30(18):2183-9. doi: 10.1200/JCO.2011.38.0410. Epub 2012 Mar 26.  Results of a pivotal phase II study of brentuximab vedotin for patients with relapsed or refractory Hodgkin's lymphoma. Margarette Asal, [place], [place], [place], [place], [place], [place], [place], [place], [place], [place] A, [place] CH, [place] JM, [place] A, [place] Gambles, [place] EL, Chen R.   PURPOSE:  Brentuximab vedotin is an antibody-drug conjugate [facility]) that selectively delivers monomethyl auristatin E, an antimicrotubule agent, into CD30-expressing cells. In phase I studies, brentuximab vedotin demonstrated significant activity with a favorable safety profile in patients with relapsed or refractory CD30-positive lymphomas. PATIENTS AND METHODS:  In this multinational, open-label, phase II study, the efficacy and safety of brentuximab vedotin were evaluated in patients with relapsed or refractory Hodgkin's lymphoma (HL) after autologous stem-cell transplantation (auto-SCT). Patients had histologically documented CD30-positive HL by central pathology review. A total of 102  patients were treated with brentuximab vedotin 1.8 mg/kg by intravenous infusion every 3 weeks. In the absence of disease progression or prohibitive toxicity, patients received a maximum of 16 cycles. The primary end point was the overall objective response rate (ORR) determined by an independent radiology review facility. RESULTS:  The ORR was 75% with complete remission (CR) in 34% of patients. The median progression-free  survival time for all patients was 5.6 months, and the median duration of response for those in CR was 20.5 months. After a median observation time of more than 1.5 years, 31 patients were alive and free of documented progressive disease. The most common treatment-related adverse events were peripheral sensory neuropathy, nausea, fatigue, neutropenia, and diarrhea. CONCLUSION:  The ADC brentuximab vedotin was associated with manageable toxicity and induced objective responses in 75% of patients with relapsed or refractory HL after auto-SCT. Durable CRs approaching 2 years were observed, supporting study in earlier lines of therapy.  Some of the short term side-effects included, though not limited to, risk of fatigue, weight loss, tumor lysis syndrome, risk of allergic reactions, pancytopenia, pneumonitis life-threatening infections, need for transfusions of blood products, admission to hospital for various reasons, and risks of death.   The patient is aware that the response rates discussed earlier is not guaranteed.    After a long discussion, patient made an informed decision to proceed with the prescribed plan of care.   Pancytopenia She has received 2 units of blood transfusion on 03/08/2016 No further transfusion unless hemoglobin less than 8 She does not need platelet transfusion unless less than 10 or if she have signs of bleeding Hold off GCSF until after chemotherapy  Exarcebation of congestive heart failure with cough and orthopnea Add lasix BID, she appears to be doing well and diuresis and well Monitor in & out and daily weight BNP has improved. Continue for now  Progressive elevated liver enzymes and leg swelling CT scan of the abdomen did not reveal the cause of abnormal liver enzymes. Incidentally, there is some mesenteric edema AppreciateGI for evaluation Could be due to volume overload and CHF  Poor oral intake, protein calorie malnutrition This is likely due to  cancer relapse Started remeron  Significant weakness Continue physical therapy  DVT prophylaxis Lovenox on hold due to low platelet count  CODE STATUS Full code  Goals of care discussion The patient is aware she has incurable disease and treatment is strictly palliative. We discussed importance of Advanced Directives and Living will. I will get assistance from our social worker to help her fill out some paperwork. We discussed CODE STATUS; the patient desires to remain in full code. She appointed her husband as medical healthcare power of attorney  Discharge planning With her needing transfusion every 2 days, she is not safe for discharge. I recommend holding off discharge until stability is achieved  [name], MD 03/13/2016  7:49 AM

## 2016-03-13 NOTE — Progress Notes (Signed)
Her pancytopenia is due to bone marrow infiltration. Will proceed with treatment. Elevated LFTs is likely due to congestive heart failure and possibly side effects from recent IVIG. While the liver enzymes could be due to heart failure, the elevated bilirubin is likely due to recent transfusion of blood products. Per guidelines, I plan to reduce Brentuximab to 1.2 mg/kg.

## 2016-03-13 NOTE — Progress Notes (Signed)
Physical Therapy Treatment Patient Details Name: Diamond Boyd MRN: [id number] DOB: [birth year] Today's Date: 03/13/2016    History of Present Illness 76 yo presented to ER secondary to recurrent epistaxis, sinusitis s/p maxillary exploration. PMhx: non Hodgkin lymphoma, anemia, CHF, colon CA, COPD    PT Comments    Pt continues to participate well. She also continues to complain of general weakness. Decreased ambulation distance and increased assistance required today. Will continue to follow and progress activity as tolerated.   Follow Up Recommendations  Home health PT; 24 hour supervision/assist     Equipment Recommendations  None recommended by PT    Recommendations for Other Services       Precautions / Restrictions Precautions Precautions: Fall Restrictions Weight Bearing Restrictions: No    Mobility  Bed Mobility Overal bed mobility: Needs Assistance Bed Mobility: Supine to Sit;Sit to Supine     Supine to sit: Min guard Sit to supine: Mod assist   General bed mobility comments: Increased time and use of bedrail to get to EOB. Assist for bil LEs onto bed.   Transfers Overall transfer level: Needs assistance Equipment used: Rolling walker (2 wheeled) Transfers: Sit to/from Stand Sit to Stand: Min assist         General transfer comment: Assist to stabilize initially.   Ambulation/Gait Ambulation/Gait assistance: Min assist Ambulation Distance (Feet): 100 Feet Assistive device: Rolling walker (2 wheeled) Gait Pattern/deviations: Step-through pattern;Decreased stride length     General Gait Details: decreased distance today. Pt reports she still feels weak. Intermittent assist to steady.    Stairs            Wheelchair Mobility    Modified Rankin (Stroke Patients Only)       Balance                                    Cognition Arousal/Alertness: Awake/alert Behavior During Therapy: WFL for tasks  assessed/performed Overall Cognitive Status: Within Functional Limits for tasks assessed                      Exercises      General Comments        Pertinent Vitals/Pain Pain Assessment: No/denies pain    Home Living                      Prior Function            PT Goals (current goals can now be found in the care plan section) Progress towards PT goals: Progressing toward goals    Frequency    Min 3X/week      PT Plan Current plan remains appropriate    Co-evaluation             End of Session Equipment Utilized During Treatment: Gait belt Activity Tolerance: Patient limited by fatigue Patient left: in bed;with call bell/phone within reach;with bed alarm set     Time: 1245-8099 PT Time Calculation (min) (ACUTE ONLY): 12 min  Charges:  $Gait Training: 8-22 mins                    G Codes:      Weston Anna, MPT Pager: [phone]

## 2016-03-13 NOTE — Progress Notes (Signed)
Dosage and dilution of Brentuximab verified with Clotilde Dieter RN.

## 2016-03-13 NOTE — Progress Notes (Signed)
CM continuing to follow along. Plan still to dc with [facility] at discharge. Marney Doctor RN,BSN,NCM [phone]

## 2016-03-13 NOTE — Progress Notes (Signed)
[facility] Progress Note  Chief Complaint:   Abnormal liver function studies  Subjective: feels okay today. No abdominal pain. Daughter visiting  Objective:  Vital signs in last 24 hours: Temp:  [97.8 F (36.6 C)-98.7 F (37.1 C)] 97.8 F (36.6 C) (02/01 0635) Pulse Rate:  [89-103] 98 (02/01 0635) Resp:  [16-18] 16 (02/01 0635) BP: (100-142)/(45-77) 142/45 (02/01 0635) SpO2:  [94 %-99 %] 96 % (02/01 0635) Last BM Date: 03/13/16 General:   Alert, obese white female in NAD EENT:  Normal hearing, non icteric sclera, conjunctive pink.  Heart:  Regular rate and rhythm, trace bilateral lower extremity edema Pulm: Normal respiratory effort,. Abdomen:  Soft, nondistended, nontender.  Normal bowel sounds Neurologic:  Alert and  oriented x4;  grossly normal neurologically. Psych:  Alert and cooperative. Normal mood and affect.   Intake/Output from previous day: 01/31 0701 - 02/01 0700 In: 29 [P.O.:960; Blood:30] Out: -  Intake/Output this shift: Total I/O In: 240 [P.O.:240] Out: -   Lab Results:  Recent Labs  03/11/16 0508 03/12/16 0557 03/13/16 0658  WBC 1.5* 1.6* 2.1*  HGB 10.2* 11.2* 10.9*  HCT 30.2* 32.4* 33.0*  PLT 15* 9* 14*   BMET  Recent Labs  03/11/16 0508 03/12/16 0557 03/13/16 0658  NA 137 135 136  K 3.9 3.7 3.4*  CL 104 99* 97*  CO2 29 28 29   GLUCOSE 129* 108* 119*  BUN 26* 25* 26*  CREATININE 0.68 0.71 0.76  CALCIUM 8.2* 7.9* 8.4*   LFT  Recent Labs  03/13/16 0658  PROT 6.1*  ALBUMIN 2.4*  AST 48*  ALT 66*  ALKPHOS 282*  BILITOT 2.5*   PT/INR  Recent Labs  03/13/16 0815  LABPROT 15.5*  INR 1.22   Hepatitis Panel  Recent Labs  03/10/16 1541  HEPBSAG Negative  HCVAB 0.1  HEPAIGM Negative  HEPBIGM Negative    Assessment / Plan:  26. 76 yo female with abnormal LFTs. Alk phos chronically elevated dating back to at least Nov 2016. Transaminitis and hyperbilirubinemia new over last couple of weeks and  since last hospital discharge for respiratory infection. According to discharge summary patient was treated symptomatically for viral respiratory infection, no antibiotics. I checked with husband, patient did start any new meds in between hospital admission  -Transaminases improving. Tbili stable at 2.5.  -Viral hep panel negative. ANA, AMA negative. No hepatobiliary findings on CTscan. -she was positive for rhinovirus / enterovirus last admission 02/14/16. Not sure but research   whether liver can be affected.   2. Duodenal bowel wall thickening on CTscan. She is asymptomatic.   3. Recurrent lymphoma. For BM biopsies when platelet count allows  4. Pancytopenia / profound thrombocytopenia. Admitted for acute ITP.   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 I agree with the Advanced Practitioner's note, impression and recommendations. Duodenal thickening on CT without associated symptoms. No further evaluation is recommended at this time. LFTs stable, etiology unclear however passive congestion or an infiltrative process  are likely causes. Given recurrent lymphoma, pancytopenia and profound thrombocytopenia no further hepatic evaluation is recommended at this time. Optimize mgmt of CHF. Trend LFT and if they substantially worsen can reconsult GI. GI signing off.    Lucio Edward, MD [facility] [phone] Mon-Fri 8a-5p [phone] after 5p, weekends, holidays

## 2016-03-14 DIAGNOSIS — C8190 Hodgkin lymphoma, unspecified, unspecified site: Secondary | ICD-10-CM

## 2016-03-14 DIAGNOSIS — E440 Moderate protein-calorie malnutrition: Secondary | ICD-10-CM

## 2016-03-14 DIAGNOSIS — E876 Hypokalemia: Secondary | ICD-10-CM

## 2016-03-14 LAB — CBC WITH DIFFERENTIAL/PLATELET
Basophils Absolute: 0 10*3/uL (ref 0.0–0.1)
Basophils Relative: 1 %
EOS PCT: 0 %
Eosinophils Absolute: 0 10*3/uL (ref 0.0–0.7)
HCT: 35.8 % — ABNORMAL LOW (ref 36.0–46.0)
HEMOGLOBIN: 12.2 g/dL (ref 12.0–15.0)
LYMPHS ABS: 1.4 10*3/uL (ref 0.7–4.0)
Lymphocytes Relative: 47 %
MCH: 31.7 pg (ref 26.0–34.0)
MCHC: 34.1 g/dL (ref 30.0–36.0)
MCV: 93 fL (ref 78.0–100.0)
Monocytes Absolute: 0.2 10*3/uL (ref 0.1–1.0)
Monocytes Relative: 7 %
Neutro Abs: 1.3 10*3/uL — ABNORMAL LOW (ref 1.7–7.7)
Neutrophils Relative %: 44 %
PLATELETS: 11 10*3/uL — AB (ref 150–400)
RBC: 3.85 MIL/uL — AB (ref 3.87–5.11)
RDW: 20.8 % — ABNORMAL HIGH (ref 11.5–15.5)
WBC: 3 10*3/uL — AB (ref 4.0–10.5)

## 2016-03-14 LAB — COMPREHENSIVE METABOLIC PANEL
ALK PHOS: 296 U/L — AB (ref 38–126)
ALT: 58 U/L — AB (ref 14–54)
AST: 51 U/L — ABNORMAL HIGH (ref 15–41)
Albumin: 2.6 g/dL — ABNORMAL LOW (ref 3.5–5.0)
Anion gap: 9 (ref 5–15)
BILIRUBIN TOTAL: 2.8 mg/dL — AB (ref 0.3–1.2)
BUN: 30 mg/dL — ABNORMAL HIGH (ref 6–20)
CALCIUM: 9 mg/dL (ref 8.9–10.3)
CO2: 31 mmol/L (ref 22–32)
CREATININE: 0.86 mg/dL (ref 0.44–1.00)
Chloride: 99 mmol/L — ABNORMAL LOW (ref 101–111)
Glucose, Bld: 194 mg/dL — ABNORMAL HIGH (ref 65–99)
Potassium: 3.2 mmol/L — ABNORMAL LOW (ref 3.5–5.1)
Sodium: 139 mmol/L (ref 135–145)
Total Protein: 6.4 g/dL — ABNORMAL LOW (ref 6.5–8.1)

## 2016-03-14 MED ORDER — POTASSIUM CHLORIDE CRYS ER 20 MEQ PO TBCR
20.0000 meq | EXTENDED_RELEASE_TABLET | Freq: Two times a day (BID) | ORAL | Status: AC
  Administered 2016-03-14 (×2): 20 meq via ORAL
  Filled 2016-03-14 (×2): qty 1

## 2016-03-14 MED ORDER — DIPHENHYDRAMINE HCL 25 MG PO CAPS
25.0000 mg | ORAL_CAPSULE | Freq: Once | ORAL | Status: AC
  Administered 2016-03-14: 25 mg via ORAL
  Filled 2016-03-14 (×2): qty 1

## 2016-03-14 MED ORDER — SODIUM CHLORIDE 0.9 % IV SOLN
Freq: Once | INTRAVENOUS | Status: AC
Start: 2016-03-14 — End: 2016-03-14
  Administered 2016-03-14: 12:00:00 via INTRAVENOUS

## 2016-03-14 MED ORDER — FUROSEMIDE 20 MG PO TABS
20.0000 mg | ORAL_TABLET | Freq: Two times a day (BID) | ORAL | Status: AC
  Administered 2016-03-14 – 2016-03-16 (×6): 20 mg via ORAL
  Filled 2016-03-14 (×6): qty 1

## 2016-03-14 MED ORDER — ACETAMINOPHEN 325 MG PO TABS
650.0000 mg | ORAL_TABLET | Freq: Once | ORAL | Status: AC
  Administered 2016-03-14: 650 mg via ORAL
  Filled 2016-03-14 (×2): qty 2

## 2016-03-14 NOTE — Consult Note (Addendum)
   [facility] CM Inpatient Consult   03/14/2016  AUDREANNA TORRISI [birth year] [id number]   Went back to bedside to speak with Mrs. Donnellan about [place] Management program. She states she would like for her husband to look over the information firse. Mrs. Rill did express that she does not think she needed any additional services at this time however.   Spoke with Mr. Gaspari in the hallway with Mrs. Havlik's permission. Explained [place] Management program in detail. Mr. Leibensperger states he provides support for Mrs Rice with her medications, transportation, and assistance. He states they have been married 84 years and he has taken care of her. He reports patient has had a long history and "God has brought them thru". He states Mrs. Slifer will have home health as well. He does not think [place] Management is needed at this time. He pleasantly declines [facility] Care Management program but accepted contact information and [facility] Care Management brochure to contact if they should change their mind in the future.  Made inpatient RNCM aware of above conversations.   ADDENDUM: Writer was summoned in the hallway by Mr. Brumbaugh. He states he and Mrs. Froberg discussed it and they are interested in [place] Management program. Mr. Jenkinson states they decided they could use the additional follow up after all. Discussed patient's history of CHF as well. Mr. Rewis states "she was weighing every day until she got so sick".  Written consent was obtained. [facility] Care Management packet and contact information provided. Confirmed Primary Care MD is Dr. Glori Bickers. Explained [facility] Care Management will not interfere or replace home health services.   Will request to be assigned to [place]. Mrs. Mallery has had 4 hospitalizations in the past 6 months. History of CHF, colon cancer, Hodgkin's lymphoma, follilcular B cell lymphoma in remission, paroxysmal atrial fibrillation, HTN. Also noted goals of  care has been done. Reviewed oncology notes.   Will make inpatient RNCM aware that patient and husband are now agreeable to [place] Management program services.    Marthenia Rolling, MSN-Ed, RN,BSN [facility] Liaison [phone]

## 2016-03-14 NOTE — Progress Notes (Addendum)
Diamond Boyd   DOB:[birth year]   LY#:[id number]    Subjective: She denies side effects from treatment. She feels weak overall and has not move around much. Her shortness of breath has improved and she is able to lie down in her bed again. She denies diarrhea or constipation. The patient denies any recent signs or symptoms of bleeding such as spontaneous epistaxis, hematuria or hematochezia.   Objective:  Vitals:   03/13/16 2115 03/14/16 0536  BP: (!) 102/54 111/71  Pulse: 74 67  Resp: 20 20  Temp: 97.4 F (36.3 C) 97.3 F (36.3 C)     Intake/Output Summary (Last 24 hours) at 03/14/16 0811 Last data filed at 03/14/16 8127  Gross per 24 hour  Intake              960 ml  Output                0 ml  Net              960 ml    GENERAL:alert, no distress and comfortable SKIN: skin color, texture, turgor are normal, no rashes or significant lesions. Noted skin bruising EYES: normal, Conjunctiva are pink and non-injected, sclera clear OROPHARYNX:no exudate, no erythema and lips, buccal mucosa, and tongue normal  NECK: supple, thyroid normal size, non-tender, without nodularity LYMPH:  no palpable lymphadenopathy in the cervical, axillary or inguinal LUNGS: clear to auscultation and percussion with normal breathing effort HEART: regular rate & rhythm and no murmurs with moderate bilateral lower extremity edema ABDOMEN:abdomen soft, non-tender and normal bowel sounds Musculoskeletal:no cyanosis of digits and no clubbing  NEURO: alert & oriented x 3 with fluent speech, no focal motor/sensory deficits   Labs:  Lab Results  Component Value Date   WBC 3.0 (L) 03/14/2016   HGB 12.2 03/14/2016   HCT 35.8 (L) 03/14/2016   MCV 93.0 03/14/2016   PLT 11 (LL) 03/14/2016   NEUTROABS 1.3 (L) 03/14/2016    Lab Results  Component Value Date   NA 139 03/14/2016   K 3.2 (L) 03/14/2016   CL 99 (L) 03/14/2016   CO2 31 03/14/2016    Assessment & Plan:   Recurrent classical Hodgkin  lymphoma Recent PET CT scan show hyper intense metabolic activity in the bone marrow area  Bone marrow biopsy from 03/10/16 is reviewed with the pathologist which show greater than 50% bone marrow involvement from Hodgkin lymphoma She has received cycle 1 of chemotherapy on 03/13/2016 Continue supportive care   Pancytopenia She has received 2 units of blood transfusion on 03/08/2016 No further transfusion unless hemoglobin less than 8 Her platelet count is trending down. Due to recent chemotherapy, I plan to give her platelet transfusion We discussed some of the risks, benefits, and alternatives of platelets transfusions. The patient is symptomatic from low platelet counts with bruising/bleeding/at high risk of life-threatening bleeding and the platelet count is critically low.  Some of the side-effects to be expected including risks of transfusion reactions, chills, infection, syndrome of volume overload and risk of hospitalization from various reasons and the patient is willing to proceed Hold off GCSF for now  Exarcebation of congestive heart failure with cough and orthopnea Add lasix BID, she appears to be doing well and diuresis and well Monitor in & out and daily weight BNP has improved. I plan to switch her to oral Lasix twice a day  Hypokalemia due to diuretic therapy We'll order potassium replacement therapy  Elevated liver enzymes and leg  swelling CT scan of the abdomen did not reveal the cause of abnormal liver enzymes. Incidentally, there is some mesenteric edema AppreciateGI for evaluation Could be due to volume overload and CHF Liver enzymes are improving. Hyperbilirubin is likely due to recent transfusion She received reduced dose chemotherapy, adjusted based on recent liver dysfunction  Poor oral intake, moderate protein calorie malnutrition This is likely due to cancer relapse Startedremeron  Significant weakness Continue physical therapy  DVT  prophylaxis Lovenox on hold due to low platelet count  CODE STATUS Full code  Goals of care discussion The patient is aware she has incurable disease and treatment is strictly palliative. We discussed importance of Advanced Directives and Living will. I will get assistance from our social worker to help her fill out some paperwork. We discussed CODE STATUS; the patient desires to remain in full code. She appointed her husband as medical healthcare power of attorney  Discharge planning With her needing transfusion every 2 days, she is not safe for discharge. I recommend holding off discharge until stability is achieved  [name], MD 03/14/2016  8:11 AM

## 2016-03-14 NOTE — Consult Note (Signed)
   [facility] CM Inpatient Consult   03/14/2016  KELI BUEHNER [birth year] [id number]    Mrs. Mifflin screened for [place] Management program. Spoke with inpatient RNCM prior to visiting patient. Went to bedside, however, she was in the bathroom per nursing. Will come back at later time.   Marthenia Rolling, MSN-Ed, RN,BSN [facility] Liaison [phone]

## 2016-03-14 NOTE — Progress Notes (Signed)
PT Cancellation Note  Patient Details Name: Diamond Boyd MRN: [id number] DOB: 08-05-1940   Cancelled Treatment:    Reason Eval/Treat Not Completed: Patient declined, no reason specified Pt politely declines today, states she ambulated earlier today (RN confirms) and plans to ambulate again when spouse returns.   Latrica Clowers,KATHrine E 03/14/2016, 2:15 PM Carmelia Bake, PT, DPT 03/14/2016 Pager: [phone]

## 2016-03-15 DIAGNOSIS — K5900 Constipation, unspecified: Secondary | ICD-10-CM

## 2016-03-15 LAB — CBC WITH DIFFERENTIAL/PLATELET
Basophils Absolute: 0 10*3/uL (ref 0.0–0.1)
Basophils Relative: 0 %
EOS PCT: 0 %
Eosinophils Absolute: 0 10*3/uL (ref 0.0–0.7)
HCT: 31.4 % — ABNORMAL LOW (ref 36.0–46.0)
HEMOGLOBIN: 10.7 g/dL — AB (ref 12.0–15.0)
LYMPHS PCT: 30 %
Lymphs Abs: 1.1 10*3/uL (ref 0.7–4.0)
MCH: 31.5 pg (ref 26.0–34.0)
MCHC: 34.1 g/dL (ref 30.0–36.0)
MCV: 92.4 fL (ref 78.0–100.0)
MONOS PCT: 11 %
Monocytes Absolute: 0.4 10*3/uL (ref 0.1–1.0)
Neutro Abs: 2 10*3/uL (ref 1.7–7.7)
Neutrophils Relative %: 59 %
Platelets: 35 10*3/uL — ABNORMAL LOW (ref 150–400)
RBC: 3.4 MIL/uL — AB (ref 3.87–5.11)
RDW: 20.7 % — ABNORMAL HIGH (ref 11.5–15.5)
WBC: 3.5 10*3/uL — AB (ref 4.0–10.5)

## 2016-03-15 MED ORDER — DIPHENHYDRAMINE HCL 25 MG PO CAPS
25.0000 mg | ORAL_CAPSULE | Freq: Once | ORAL | Status: AC
  Administered 2016-03-15: 25 mg via ORAL
  Filled 2016-03-15: qty 1

## 2016-03-15 MED ORDER — DIPHENHYDRAMINE HCL 50 MG PO CAPS: 50.0000 mg | ORAL_CAPSULE | Freq: Every evening | ORAL | Status: DC | PRN

## 2016-03-15 MED ORDER — DIPHENHYDRAMINE HCL 25 MG PO CAPS
25.0000 mg | ORAL_CAPSULE | Freq: Every evening | ORAL | Status: DC | PRN
  Administered 2016-03-15 – 2016-03-16 (×2): 25 mg via ORAL
  Filled 2016-03-15 (×2): qty 1

## 2016-03-15 MED ORDER — DOCUSATE SODIUM 100 MG PO CAPS
100.0000 mg | ORAL_CAPSULE | Freq: Two times a day (BID) | ORAL | Status: DC
  Administered 2016-03-15 – 2016-03-18 (×7): 100 mg via ORAL
  Filled 2016-03-15 (×7): qty 1

## 2016-03-15 NOTE — Progress Notes (Signed)
Diamond Boyd   DOB:1940-10-16   SF#:[id number]    Subjective: She is feeling better. Able to sleep in her bed. Mildly constipated. The patient denies any recent signs or symptoms of bleeding such as spontaneous epistaxis, hematuria or hematochezia.   Objective:  Vitals:   03/14/16 2020 03/15/16 0508  BP: 132/60 123/62  Pulse: 98 99  Resp: 14 16  Temp: 97.8 F (36.6 C) 97.6 F (36.4 C)     Intake/Output Summary (Last 24 hours) at 03/15/16 0174 Last data filed at 03/14/16 1600  Gross per 24 hour  Intake              400 ml  Output             1250 ml  Net             -850 ml    GENERAL:alert, no distress and comfortable SKIN: skin color, texture, turgor are normal, no rashes or significant lesions EYES: normal, Conjunctiva are pink and non-injected, sclera clear OROPHARYNX:no exudate, no erythema and lips, buccal mucosa, and tongue normal  NECK: supple, thyroid normal size, non-tender, without nodularity LYMPH:  no palpable lymphadenopathy in the cervical, axillary or inguinal LUNGS: clear to auscultation and percussion with normal breathing effort HEART: regular rate & rhythm and no murmurs and no lower extremity edema ABDOMEN:abdomen soft, non-tender and normal bowel sounds Musculoskeletal:no cyanosis of digits and no clubbing  NEURO: alert & oriented x 3 with fluent speech, no focal motor/sensory deficits   Labs:  Lab Results  Component Value Date   WBC 3.5 (L) 03/15/2016   HGB 10.7 (L) 03/15/2016   HCT 31.4 (L) 03/15/2016   MCV 92.4 03/15/2016   PLT 35 (L) 03/15/2016   NEUTROABS 2.0 03/15/2016    Lab Results  Component Value Date   NA 139 03/14/2016   K 3.2 (L) 03/14/2016   CL 99 (L) 03/14/2016   CO2 31 03/14/2016     Assessment & Plan:   Recurrent classical Hodgkin lymphoma Recent PET CT scan showed hyper intense metabolic activity in the bone marrow area  Bone marrow biopsy from 03/10/16 is reviewed with the pathologist which show greater than 50% bone  marrow involvement from Hodgkin lymphoma She has received cycle 1 of chemotherapy on 03/13/2016 Continue supportive care   Pancytopenia She has received 2 units of blood transfusion on 03/08/2016 No further transfusion unless hemoglobin less than 8 Continue to monitor platelet count carefully and transfuse if less than 15,000 Neutropenia has resolved  Exarcebation of congestive heart failure with cough and orthopnea Add lasix BID, she appears to be doing well and diuresis and well Monitor in & out and daily weight BNP has improved.Continue oral Lasix twice a day  Hypokalemia due to diuretic therapy Recheck tomorrow  Elevated liver enzymes and leg swelling CT scan of the abdomen did not reveal the cause of abnormal liver enzymes. Incidentally, there is some mesenteric edema AppreciateGI for evaluation Could be due to volume overload and CHF Liver enzymes are improving. Hyperbilirubin is likely due to recent transfusion She received reduced dose chemotherapy, adjusted based on recent liver dysfunction Recheck tomorrow  Poor oral intake, moderate protein calorie malnutrition This is likely due to cancer relapse Startedremeron  Significant weakness Continue physical therapy  Constipation Add colace  DVT prophylaxis Lovenox on hold due to low platelet count  CODE STATUS Full code  Goals of care discussion The patient is aware shehas incurabledisease and treatment is strictly palliative. We discussed importance  of Advanced Directives and Living will. I will get assistance from our social worker to help herfill out some paperwork. We discussed CODE STATUS; the patient desires to remain in full code. She appointed her husband as medical healthcare power of attorney  Discharge planning With her needing transfusion every 2 days, she is not safe for discharge. I recommend holding off discharge until stability is achieved  [name], MD 03/15/2016  9:58 AM

## 2016-03-16 LAB — COMPREHENSIVE METABOLIC PANEL
ALT: 55 U/L — ABNORMAL HIGH (ref 14–54)
AST: 56 U/L — ABNORMAL HIGH (ref 15–41)
Albumin: 2.6 g/dL — ABNORMAL LOW (ref 3.5–5.0)
Alkaline Phosphatase: 285 U/L — ABNORMAL HIGH (ref 38–126)
Anion gap: 8 (ref 5–15)
BILIRUBIN TOTAL: 2.2 mg/dL — AB (ref 0.3–1.2)
BUN: 29 mg/dL — AB (ref 6–20)
CHLORIDE: 99 mmol/L — AB (ref 101–111)
CO2: 31 mmol/L (ref 22–32)
CREATININE: 0.88 mg/dL (ref 0.44–1.00)
Calcium: 8.7 mg/dL — ABNORMAL LOW (ref 8.9–10.3)
GFR calc Af Amer: >60 mL/min (ref >60)
Glucose, Bld: 148 mg/dL — ABNORMAL HIGH (ref 65–99)
POTASSIUM: 3.6 mmol/L (ref 3.5–5.1)
Sodium: 138 mmol/L (ref 135–145)
TOTAL PROTEIN: 5.7 g/dL — AB (ref 6.5–8.1)

## 2016-03-16 MED ORDER — OXYCODONE HCL 5 MG PO TABS
5.0000 mg | ORAL_TABLET | ORAL | Status: DC | PRN
  Administered 2016-03-17: 5 mg via ORAL
  Filled 2016-03-16 (×2): qty 1

## 2016-03-16 MED ORDER — MAGIC MOUTHWASH W/LIDOCAINE
5.0000 mL | Freq: Three times a day (TID) | ORAL | Status: DC
  Administered 2016-03-16 – 2016-03-18 (×6): 5 mL via ORAL
  Filled 2016-03-16 (×8): qty 5

## 2016-03-16 MED ORDER — PRAMOXINE HCL 1 % RE FOAM
Freq: Three times a day (TID) | RECTAL | Status: DC | PRN
  Administered 2016-03-16: 14:00:00 via RECTAL
  Filled 2016-03-16: qty 15

## 2016-03-16 MED ORDER — TBO-FILGRASTIM 300 MCG/0.5ML ~~LOC~~ SOSY
300.0000 ug | PREFILLED_SYRINGE | Freq: Every day | SUBCUTANEOUS | Status: DC
  Administered 2016-03-16 – 2016-03-17 (×2): 300 ug via SUBCUTANEOUS
  Filled 2016-03-16 (×3): qty 0.5

## 2016-03-16 NOTE — Progress Notes (Signed)
Diamond Boyd   DOB:[birth year]   WJ#:[id number]    Subjective:  The patient is in significant pain from hemorrhoid irritation. She is still mildly constipated. Her shortness of breath is stable and she is able to sleep in her bed. She was able to walk 3 times yesterday. She had minor bleeding from hemorrhoids. Denies further nosebleed, hemoptysis or hematuria. No recent fevers  Objective:  Vitals:   03/15/16 2038 03/16/16 0543  BP: 128/67 118/68  Pulse: 91 96  Resp: 18 18  Temp: 98 F (36.7 C) 97.6 F (36.4 C)     Intake/Output Summary (Last 24 hours) at 03/16/16 0857 Last data filed at 03/16/16 0544  Gross per 24 hour  Intake              940 ml  Output                0 ml  Net              940 ml    GENERAL:alert, no distress and comfortable SKIN: skin color, texture, turgor are normal, no rashes or significant lesions. Noted multiple bruises  EYES: normal, Conjunctiva are pink and non-injected, sclera clear OROPHARYNX:no exudate, no erythema and lips, buccal mucosa, and tongue normal  NECK: supple, thyroid normal size, non-tender, without nodularity LYMPH:  no palpable lymphadenopathy in the cervical, axillary or inguinal LUNGS: clear to auscultation and percussion with normal breathing effort HEART: regular rate & rhythm and no murmurs and no lower extremity edema ABDOMEN:abdomen soft, non-tender and normal bowel sounds Musculoskeletal:no cyanosis of digits and no clubbing  NEURO: alert & oriented x 3 with fluent speech, no focal motor/sensory deficits   Labs:  Lab Results  Component Value Date   WBC 3.1 (L) 03/16/2016   HGB 11.7 (L) 03/16/2016   HCT 34.2 (L) 03/16/2016   MCV 92.2 03/16/2016   PLT 24 (LL) 03/16/2016   NEUTROABS 1.2 (L) 03/16/2016    Lab Results  Component Value Date   NA 138 03/16/2016   K 3.6 03/16/2016   CL 99 (L) 03/16/2016   CO2 31 03/16/2016   Assessment & Plan:   Recurrent classical Hodgkin lymphoma Recent PET CT scan showed hyper  intense metabolic activity in the bone marrow area  Bone marrow biopsy from 03/10/16 is reviewed with the pathologist which show greater than 50% bone marrow involvement from Hodgkin lymphoma She has received cycle 1 of chemotherapy on 03/13/2016 Continue supportive care  Pancytopenia She has received 2 units of blood transfusion on 03/08/2016 No further transfusion unless hemoglobin less than 8 Continue to monitor platelet count carefully and transfuse if less than 15,000 Neutropenia has recurred I will start her on G-CSF  Exarcebation of congestive heart failure with cough and orthopnea Add lasix BID, she appears to be doing well and diuresis and well Monitor in & out and daily weight BNP has improved.Continue oral Lasix twice a day  Hypokalemia due to diuretic therapy, resolved Recheck tomorrow  Elevated liver enzymes and leg swelling CT scan of the abdomen did not reveal the cause of abnormal liver enzymes. Incidentally, there is some mesenteric edema AppreciateGI for evaluation Could be due to volume overload and CHF Liver enzymes are improving. Hyperbilirubin is likely due to recent transfusion She received reduced dose chemotherapy, adjusted based on recent liver dysfunction Recheck tomorrow  Poor oral intake, moderate protein calorie malnutrition This is likely due to cancer relapse Startedremeron  Significant weakness Continue physical therapy and encourage ambulation  Constipation Add colace  Hemorrhoid irritation Due to neutropenia, I recommend avoiding suppository or Anusol I added oxycodone as needed for severe pain. Tylenol as needed for mild pain I recommend addition of sitz baths and proctofoam  Mucositis due to chemotherapy Likely related to neutropenia Added G-CSF and Magic mouthwash  DVT prophylaxis Lovenox on hold due to low platelet count  CODE STATUS Full code  Goals of care discussion The patient is aware shehas  incurabledisease and treatment is strictly palliative. We discussed importance of Advanced Directives and Living will. I will get assistance from our social worker to help herfill out some paperwork. We discussed CODE STATUS; the patient desires to remain in full code. She appointed her husband as medical healthcare power of attorney  Discharge planning With her needing transfusion every 2-3 days, she is not safe for discharge. I recommend holding off discharge until stability is achieved  [name], MD 03/16/2016  8:57 AM

## 2016-03-17 DIAGNOSIS — K1231 Oral mucositis (ulcerative) due to antineoplastic therapy: Secondary | ICD-10-CM

## 2016-03-17 LAB — CBC WITH DIFFERENTIAL/PLATELET
BASOS PCT: 0 %
Basophils Absolute: 0.1 10*3/uL (ref 0.0–0.1)
Basophils Absolute: 0 10*3/uL (ref 0.0–0.1)
Basophils Relative: 2 %
EOS ABS: 0 10*3/uL (ref 0.0–0.7)
EOS ABS: 0 10*3/uL (ref 0.0–0.7)
EOS PCT: 0 %
Eosinophils Relative: 0 %
HEMATOCRIT: 34.2 % — AB (ref 36.0–46.0)
HEMATOCRIT: 29.6 % — AB (ref 36.0–46.0)
HEMOGLOBIN: 9.8 g/dL — AB (ref 12.0–15.0)
Hemoglobin: 11.7 g/dL — ABNORMAL LOW (ref 12.0–15.0)
LYMPHS ABS: 1.7 10*3/uL (ref 0.7–4.0)
LYMPHS ABS: 1.5 10*3/uL (ref 0.7–4.0)
LYMPHS PCT: 54 %
Lymphocytes Relative: 20 %
MCH: 31.5 pg (ref 26.0–34.0)
MCH: 30.8 pg (ref 26.0–34.0)
MCHC: 34.2 g/dL (ref 30.0–36.0)
MCHC: 33.1 g/dL (ref 30.0–36.0)
MCV: 92.2 fL (ref 78.0–100.0)
MCV: 93.1 fL (ref 78.0–100.0)
MONO ABS: 0.2 10*3/uL (ref 0.1–1.0)
MONO ABS: 0.2 10*3/uL (ref 0.1–1.0)
MONOS PCT: 7 %
Monocytes Relative: 3 %
NEUTROS ABS: 6 10*3/uL (ref 1.7–7.7)
NEUTROS PCT: 77 %
Neutro Abs: 1.2 10*3/uL — ABNORMAL LOW (ref 1.7–7.7)
Neutrophils Relative %: 37 %
PLATELETS: 24 10*3/uL — AB (ref 150–400)
Platelets: 20 10*3/uL — CL (ref 150–400)
RBC: 3.71 MIL/uL — ABNORMAL LOW (ref 3.87–5.11)
RBC: 3.18 MIL/uL — ABNORMAL LOW (ref 3.87–5.11)
RDW: 20.9 % — AB (ref 11.5–15.5)
RDW: 21 % — AB (ref 11.5–15.5)
WBC: 3.1 10*3/uL — AB (ref 4.0–10.5)
WBC: 7.7 10*3/uL (ref 4.0–10.5)

## 2016-03-17 LAB — BRAIN NATRIURETIC PEPTIDE: B NATRIURETIC PEPTIDE 5: 743.1 pg/mL — AB (ref 0.0–100.0)

## 2016-03-17 LAB — PREPARE PLATELET PHERESIS
Blood Product Expiration Date: 201802022359
ISSUE DATE / TIME: 201802021155
Unit Type and Rh: 6200

## 2016-03-17 LAB — COMPREHENSIVE METABOLIC PANEL
ALK PHOS: 264 U/L — AB (ref 38–126)
ALT: 45 U/L (ref 14–54)
ANION GAP: 7 (ref 5–15)
AST: 45 U/L — ABNORMAL HIGH (ref 15–41)
Albumin: 2.2 g/dL — ABNORMAL LOW (ref 3.5–5.0)
BILIRUBIN TOTAL: 1.8 mg/dL — AB (ref 0.3–1.2)
BUN: 25 mg/dL — ABNORMAL HIGH (ref 6–20)
CALCIUM: 8.4 mg/dL — AB (ref 8.9–10.3)
CO2: 31 mmol/L (ref 22–32)
Chloride: 101 mmol/L (ref 101–111)
Creatinine, Ser: 0.92 mg/dL (ref 0.44–1.00)
GFR, EST NON AFRICAN AMERICAN: 59 mL/min — AB (ref >60)
Glucose, Bld: 161 mg/dL — ABNORMAL HIGH (ref 65–99)
POTASSIUM: 3.1 mmol/L — AB (ref 3.5–5.1)
Sodium: 139 mmol/L (ref 135–145)
TOTAL PROTEIN: 5.3 g/dL — AB (ref 6.5–8.1)

## 2016-03-17 LAB — CHROMOSOME ANALYSIS, BONE MARROW

## 2016-03-17 LAB — TISSUE HYBRIDIZATION (BONE MARROW)-NCBH

## 2016-03-17 MED ORDER — POTASSIUM CHLORIDE CRYS ER 20 MEQ PO TBCR
20.0000 meq | EXTENDED_RELEASE_TABLET | Freq: Two times a day (BID) | ORAL | Status: DC
  Administered 2016-03-17 – 2016-03-18 (×3): 20 meq via ORAL
  Filled 2016-03-17 (×3): qty 1

## 2016-03-17 MED ORDER — FUROSEMIDE 10 MG/ML IJ SOLN
20.0000 mg | Freq: Two times a day (BID) | INTRAMUSCULAR | Status: DC
  Administered 2016-03-17 – 2016-03-18 (×3): 20 mg via INTRAVENOUS
  Filled 2016-03-17 (×3): qty 2

## 2016-03-17 NOTE — Progress Notes (Signed)
Physical Therapy Treatment Patient Details Name: ARBADELLA KIMBLER MRN: [id number] DOB: 24-Jul-1940 Today's Date: 03/17/2016    History of Present Illness 76 yo presented to ER secondary to recurrent epistaxis, sinusitis s/p maxillary exploration. PMhx: non Hodgkin lymphoma, anemia, CHF, colon CA, COPD    PT Comments    Progressing with mobility. Pt stated she feels stronger today. She c/o hemorrhoid pain-requested pain meds at end of session. Husband has been walking with pt around unit multiple times /day.   Follow Up Recommendations  Home health PT;Supervision/Assistance - 24 hour     Equipment Recommendations  None recommended by PT    Recommendations for Other Services       Precautions / Restrictions Precautions Precautions: Fall Restrictions Weight Bearing Restrictions: No    Mobility  Bed Mobility Overal bed mobility: Needs Assistance Bed Mobility: Supine to Sit;Sit to Supine     Supine to sit: HOB elevated;Min guard Sit to supine: Min assist;HOB elevated   General bed mobility comments: Increased time and use of bedrail to get to EOB. Assist for bil LEs onto bed.   Transfers Overall transfer level: Needs assistance Equipment used: Rolling walker (2 wheeled) Transfers: Sit to/from Stand Sit to Stand: Min guard         General transfer comment: close guard for safety. VCs hand placement  Ambulation/Gait Ambulation/Gait assistance: Min guard Ambulation Distance (Feet): 800 Feet Assistive device: Rolling walker (2 wheeled) Gait Pattern/deviations: Step-through pattern;Decreased stride length     General Gait Details: close guard for safety. HR up to 140s, O2 sats 97% on RA. 1 brief standing rest break taken/needed.    Stairs            Wheelchair Mobility    Modified Rankin (Stroke Patients Only)       Balance                                    Cognition Arousal/Alertness: Awake/alert Behavior During Therapy: WFL for  tasks assessed/performed Overall Cognitive Status: Within Functional Limits for tasks assessed                      Exercises      General Comments        Pertinent Vitals/Pain Pain Assessment: Faces Faces Pain Scale: Hurts even more Pain Location: buttocks (hemorrhoids) Pain Descriptors / Indicators: Sore Pain Intervention(s): Limited activity within patient's tolerance;Repositioned    Home Living                      Prior Function            PT Goals (current goals can now be found in the care plan section) Progress towards PT goals: Progressing toward goals    Frequency    Min 3X/week      PT Plan Current plan remains appropriate    Co-evaluation             End of Session Equipment Utilized During Treatment: Gait belt Activity Tolerance: Patient tolerated treatment well Patient left: in bed;with call bell/phone within reach;with family/visitor present     Time: 7793-9030 PT Time Calculation (min) (ACUTE ONLY): 23 min  Charges:  $Gait Training: 8-22 mins                    G Codes:      Weston Anna, MPT Pager: [phone]

## 2016-03-17 NOTE — Care Management Important Message (Signed)
Important Message  Patient Details  Name: Diamond Boyd MRN: [id number] Date of Birth: 06/09/1940   Medicare Important Message Given:  Yes    Kerin Salen 03/17/2016, 10:50 AMImportant Message  Patient Details  Name: Diamond Boyd MRN: [id number] Date of Birth: 1940-12-09   Medicare Important Message Given:  Yes    Kerin Salen 03/17/2016, 10:50 AM

## 2016-03-17 NOTE — Progress Notes (Signed)
Diamond Boyd   DOB:03/14/1940   PP#:[id number]    Subjective: She denies problems with hemorrhoidal irritation anymore. Her shortness of breath is stable. She was able to walk around 3 times yesterday.The patient denies any recent signs or symptoms of bleeding such as spontaneous epistaxis, hematuria or hematochezia. She continues to feel very weak and dizzy  Objective:  Vitals:   03/16/16 2031 03/17/16 0418  BP: (!) 115/58 (!) 122/54  Pulse: (!) 104 90  Resp: 18 (!) 118  Temp: 97.9 F (36.6 C) 97.8 F (36.6 C)     Intake/Output Summary (Last 24 hours) at 03/17/16 0853 Last data filed at 03/17/16 0002  Gross per 24 hour  Intake              600 ml  Output                0 ml  Net              600 ml    GENERAL:alert, no distress and comfortable SKIN: skin color, texture, turgor are normal, no rashes or significant lesions. Noted significant bruises EYES: normal, Conjunctiva are pink and non-injected, sclera clear OROPHARYNX:no exudate, no erythema and lips, buccal mucosa, and tongue normal  NECK: supple, thyroid normal size, non-tender, without nodularity LYMPH:  no palpable lymphadenopathy in the cervical, axillary or inguinal LUNGS: clear to auscultation and percussion with normal breathing effort HEART: regular rate & rhythm and no murmurs with moderate bilateral lower extremity edema ABDOMEN:abdomen soft, non-tender and normal bowel sounds Musculoskeletal:no cyanosis of digits and no clubbing  NEURO: alert & oriented x 3 with fluent speech, no focal motor/sensory deficits   Labs:  Lab Results  Component Value Date   WBC 7.7 03/17/2016   HGB 9.8 (L) 03/17/2016   HCT 29.6 (L) 03/17/2016   MCV 93.1 03/17/2016   PLT 20 (LL) 03/17/2016   NEUTROABS 6.0 03/17/2016    Lab Results  Component Value Date   NA 139 03/17/2016   K 3.1 (L) 03/17/2016   CL 101 03/17/2016   CO2 31 03/17/2016   Assessment & Plan:   Recurrent classical Hodgkin lymphoma Recent PET CT scan  showedhyper intense metabolic activity in the bone marrow area  Bone marrow biopsy from 03/10/16 is reviewed with the pathologist which show greater than 50% bone marrow involvement from Hodgkin lymphoma She has received cycle 1 of chemotherapy on 03/13/2016 Continue supportive care  Pancytopenia She has received 2 units of blood transfusion on 03/08/2016 No further transfusion unless hemoglobin less than 8 Continue to monitor platelet count carefully and transfuse if less than 15,000 Neutropenia has recurred, responded to G-CSF With her platelet count trending down, she will likely need platelet transfusion tomorrow  Exarcebation of congestive heart failure with cough and orthopnea Add lasix BID, she appears to be doing well and diuresis and well Monitor in & out and daily weight When her BNP has improved, I changed her tooral Lasix twice a day Her BNP went up again. I will resume IV Lasix.  Hypokalemia due to diuretic therapy I will replace with oral potassium. Encourage potassium rich diet  Elevated liver enzymes and leg swelling CT scan of the abdomen did not reveal the cause of abnormal liver enzymes. Incidentally, there is some mesenteric edema AppreciateGI for evaluation Could be due to volume overload and CHF Liver enzymes are improving. Hyperbilirubin is likely due to recent transfusion She received reduced dose chemotherapy, adjusted based on recent liver dysfunction We'll add  TED hose for leg swelling  Poor oral intake, moderate protein calorie malnutrition This is likely due to cancer relapse Startedremeron  Significant weakness Continue physical therapy and encourage ambulation  Constipation Add colace  Hemorrhoid irritation Due to neutropenia, I recommend avoiding suppository or Anusol I added oxycodone as needed for severe pain. Tylenol as needed for mild pain I recommend addition of sitz baths and proctofoam  Mucositis due to  chemotherapy Likely related to neutropenia Added G-CSF and Magic mouthwash  DVT prophylaxis Lovenox on hold due to low platelet count  CODE STATUS Full code  Goals of care discussion The patient is aware shehas incurabledisease and treatment is strictly palliative. We discussed importance of Advanced Directives and Living will. I will get assistance from our social worker to help herfill out some paperwork. We discussed CODE STATUS; the patient desires to remain in full code. She appointed her husband as medical healthcare power of attorney  Discharge planning Her platelet count has stabilized She will likely be discharged within the next 24-48 hours   [name], MD 03/17/2016  8:53 AM

## 2016-03-18 ENCOUNTER — Other Ambulatory Visit: Payer: Self-pay | Admitting: Hematology and Oncology

## 2016-03-18 LAB — CBC WITH DIFFERENTIAL/PLATELET
BASOS PCT: 1 %
Basophils Absolute: 0.1 10*3/uL (ref 0.0–0.1)
EOS ABS: 0 10*3/uL (ref 0.0–0.7)
Eosinophils Relative: 0 %
HCT: 31.4 % — ABNORMAL LOW (ref 36.0–46.0)
HEMOGLOBIN: 10.5 g/dL — AB (ref 12.0–15.0)
LYMPHS PCT: 31 %
Lymphs Abs: 1.7 10*3/uL (ref 0.7–4.0)
MCH: 31.5 pg (ref 26.0–34.0)
MCHC: 33.4 g/dL (ref 30.0–36.0)
MCV: 94.3 fL (ref 78.0–100.0)
Monocytes Absolute: 0.2 10*3/uL (ref 0.1–1.0)
Monocytes Relative: 3 %
Neutro Abs: 3.4 10*3/uL (ref 1.7–7.7)
Neutrophils Relative %: 65 %
Platelets: 17 10*3/uL — CL (ref 150–400)
RBC: 3.33 MIL/uL — ABNORMAL LOW (ref 3.87–5.11)
RDW: 20.7 % — ABNORMAL HIGH (ref 11.5–15.5)
WBC: 5.4 10*3/uL (ref 4.0–10.5)

## 2016-03-18 LAB — COMPREHENSIVE METABOLIC PANEL
ALK PHOS: 287 U/L — AB (ref 38–126)
ALT: 41 U/L (ref 14–54)
AST: 44 U/L — ABNORMAL HIGH (ref 15–41)
Albumin: 2.7 g/dL — ABNORMAL LOW (ref 3.5–5.0)
Anion gap: 11 (ref 5–15)
BUN: 17 mg/dL (ref 6–20)
CALCIUM: 8.5 mg/dL — AB (ref 8.9–10.3)
CO2: 27 mmol/L (ref 22–32)
CREATININE: 1.08 mg/dL — AB (ref 0.44–1.00)
Chloride: 99 mmol/L — ABNORMAL LOW (ref 101–111)
GFR, EST AFRICAN AMERICAN: 56 mL/min — AB (ref >60)
GFR, EST NON AFRICAN AMERICAN: 49 mL/min — AB (ref >60)
Glucose, Bld: 162 mg/dL — ABNORMAL HIGH (ref 65–99)
Potassium: 3.6 mmol/L (ref 3.5–5.1)
Sodium: 137 mmol/L (ref 135–145)
Total Bilirubin: 2.2 mg/dL — ABNORMAL HIGH (ref 0.3–1.2)
Total Protein: 5.9 g/dL — ABNORMAL LOW (ref 6.5–8.1)

## 2016-03-18 LAB — BRAIN NATRIURETIC PEPTIDE: B NATRIURETIC PEPTIDE 5: 432.8 pg/mL — AB (ref 0.0–100.0)

## 2016-03-18 MED ORDER — FUROSEMIDE 10 MG/ML IJ SOLN: 20.0000 mg | Freq: Once | INTRAMUSCULAR | Status: DC

## 2016-03-18 MED ORDER — DIPHENHYDRAMINE HCL 25 MG PO CAPS
25.0000 mg | ORAL_CAPSULE | Freq: Once | ORAL | Status: AC
  Administered 2016-03-18: 25 mg via ORAL
  Filled 2016-03-18: qty 1

## 2016-03-18 MED ORDER — ACETAMINOPHEN 325 MG PO TABS
650.0000 mg | ORAL_TABLET | Freq: Once | ORAL | Status: AC
  Administered 2016-03-18: 650 mg via ORAL
  Filled 2016-03-18: qty 2

## 2016-03-18 MED ORDER — SODIUM CHLORIDE 0.9 % IV SOLN: Freq: Once | INTRAVENOUS | Status: DC

## 2016-03-18 NOTE — Care Management Note (Signed)
Case Management Note  Patient Details  Name: Diamond Boyd MRN: [id number] Date of Birth: Jan 18, 1941  Subjective/Objective:  Awaiting resumption of  HHPT,f74forder-[facility] rep Kim aware & following.                  Action/Plan:d/c home w/HHPT   Expected Discharge Date:  03/18/16               Expected Discharge Plan:  [place] [facility] Referral:     Discharge planning Services  CM Consult  Post Acute Care Choice:  Home Health (Active w/[facility] HHPT) Choice offered to:     DME Arranged:    DME Agency:     HH Arranged:    [place]  [place] Status of Service:     If discussed [place] Meet[place], dates discussed:    Additional Comments:  MDessa Phi RN 03/18/2016, 9:48 AM

## 2016-03-18 NOTE — Discharge Summary (Signed)
Physician Discharge Summary  Patient ID: Diamond Boyd MRN: [id number] DOB/AGE: 1940-06-28 76 y.o.  Admit date: 03/04/2016 Discharge date: 03/18/2016  Primary Care Physician:  [name], MD   Discharge Diagnoses:    Present on Admission: . 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 Pancytopenia, acquired ([place]) . Elevated liver enzymes . Chronic systolic CHF (congestive heart failure), NYHA class 2 ([place]) . Hypogammaglobulinemia, acquired ([place]) . Hypoalbuminemia due to protein-calorie malnutrition [facility])   Discharge Medications:  Allergies as of 03/18/2016      Reactions   Achromycin [tetracycline Hcl] Other (See Comments)   UNSPECIFIED REACTION  Pt does not remember reaction   Acyclovir And Related    Allopurinol Other (See Comments)   REACTION: Unsure of reaction happene years ago   Astelin [azelastine Hcl] Other (See Comments)   Reaction unknown   Cephalexin Other (See Comments)   REACTION: unsure of reaction happened yrs ago.   Codeine Other (See Comments)   REACTION: abd. pain   Lisinopril Cough   Meloxicam Other (See Comments)   REACTION: GI symptoms   Minocycline Other (See Comments)   Abdominal pain   Nabumetone Other (See Comments)   REACTION: reaction not known   Nyquil [pseudoeph-doxylamine-dm-apap] Hives   Sulfa Antibiotics Other (See Comments)   Gi side eff    Zolpidem Tartrate Other (See Comments)   REACTION: feels too drugged   Buspar [buspirone Hcl] Other (See Comments)   Dizziness, and not as effective for anxiety   Ciprofloxacin Rash      Medication List    TAKE these medications   acetaminophen 650 MG CR tablet Commonly known as:  TYLENOL Take 650 mg by mouth every 8 (eight) hours as needed for pain or fever.   acyclovir 400 MG tablet Commonly known as:  ZOVIRAX Take 1 tablet (400 mg total) by mouth daily. What changed:  when to take this   BREO ELLIPTA 200-25 MCG/INH Aepb Generic drug:  fluticasone  furoate-vilanterol Inhale 1 puff into the lungs every evening.   CALCIUM 600+D 600-400 MG-UNIT tablet Generic drug:  Calcium Carbonate-Vitamin D Take 1 tablet by mouth every evening.   estradiol 1 MG tablet Commonly known as:  ESTRACE Take 1 tablet (1 mg total) by mouth daily. What changed:  when to take this   fexofenadine 180 MG tablet Commonly known as:  ALLEGRA TAKE 1 TABLET (180 MG TOTAL) BY MOUTH DAILY. (*OTC)   FLUoxetine 20 MG capsule Commonly known as:  PROZAC Take 1 capsule (20 mg total) by mouth daily. What changed:  when to take this   furosemide 20 MG tablet Commonly known as:  LASIX Take 1 tablet (20 mg total) by mouth daily. Take 1 tab by mouth daily for weight gain. What changed:  when to take this  additional instructions   gabapentin 300 MG capsule Commonly known as:  NEURONTIN Take 1 capsule (300 mg total) by mouth 2 (two) times daily.   levothyroxine 112 MCG tablet Commonly known as:  SYNTHROID, LEVOTHROID Take 1 tablet (112 mcg total) by mouth daily before breakfast.   lidocaine-prilocaine cream Commonly known as:  EMLA Apply 1 application topically as needed (For port-a-cath.). Reported on 02/22/2015   metoprolol tartrate 25 MG tablet Commonly known as:  LOPRESSOR Take 0.5 tablets (12.5 mg total) by mouth 2 (two) times daily.   MULTIVITAMIN ADULTS PO Take 1 tablet by mouth every evening.   Omega-3 350 MG Caps Take 1 capsule by mouth daily. Reported on  02/22/2015   omeprazole 40 MG capsule Commonly known as:  PRILOSEC Take 40 mg by mouth at bedtime.   ondansetron 8 MG tablet Commonly known as:  ZOFRAN Take 8 mg by mouth every 6 (six) hours as needed for nausea or vomiting. Reported on 8/32/5498   PROAIR RESPICLICK 264 (90 Base) MCG/ACT Aepb Generic drug:  Albuterol Sulfate Inhale 1-2 puffs into the lungs every 6 (six) hours as needed (SOB, wheezing).   PROBIOTIC ACIDOPHILUS Caps Take 1 capsule by mouth every evening.    prochlorperazine 10 MG tablet Commonly known as:  COMPAZINE Take 10 mg by mouth every 6 (six) hours as needed for nausea or vomiting. Reported on 03/05/2015   simvastatin 40 MG tablet Commonly known as:  ZOCOR Take 1 tablet (40 mg total) by mouth at bedtime.   sodium chloride 0.65 % Soln nasal spray Commonly known as:  OCEAN Place 2 sprays into both nostrils every 2 (two) hours while awake.   Vitamin D-3 1000 units Caps Take 1,000 Units by mouth every morning.       Disposition and Follow-up:   Significant Diagnostic Studies:  Ct Abdomen Pelvis W Contrast  Result Date: 03/09/2016 CLINICAL DATA:  Patient with progressive elevated liver enzymes and in leg swelling. EXAM: CT ABDOMEN AND PELVIS WITH CONTRAST TECHNIQUE: Multidetector CT imaging of the abdomen and pelvis was performed using the standard protocol following bolus administration of intravenous contrast. CONTRAST:  62m ISOVUE-300 IOPAMIDOL (ISOVUE-300) INJECTION 61%, 1076mISOVUE-300 IOPAMIDOL (ISOVUE-300) INJECTION 61% COMPARISON:  PET-CT 01/17/2016; CT abdomen pelvis 12/11/2015. FINDINGS: Lower chest: Normal heart size. No pericardial effusion. Dependent atelectasis within the bilateral lower lobes. No pleural effusion. Hepatobiliary: Liver is normal in size and contour. No focal hepatic lesion is identified. Patient status post cholecystectomy. Pancreas: Unremarkable Spleen: Surgically absent. Adrenals/Urinary Tract: Adrenal glands are normal. Kidneys enhance symmetrically with contrast. No hydronephrosis. Urinary bladder is unremarkable. Stomach/Bowel: Normal morphology of the stomach. There is suggestion of mild wall thickening of the duodenum and small bowel. No evidence for bowel obstruction. Postsurgical changes status post partial colonic resection. Wall thickening of the colon at the level of the surgical anastomosis (image 20; series 5). Vascular/Lymphatic: Normal caliber abdominal aorta. Peripheral calcified  atherosclerotic plaque. No enlarged retroperitoneal or pelvic lymphadenopathy. Reproductive: Uterus surgically absent. Other: Interval development of small amount of free fluid within the pelvis and small bowel mesentery. Musculoskeletal: Probable hemangioma L2 vertebral body. Unchanged tiny lucencies L3 and L4 are nonspecific. Tumor involvement is not excluded. Lower lumbar spine degenerative changes. Re- demonstrated anterior abdominal wall laxity. IMPRESSION: Interval development of wall thickening of the duodenum and small bowel throughout the abdomen with associated mesenteric free fluid and fat stranding. Findings are concerning for the possibility of enteritis. No lymphadenopathy identified within the abdomen or pelvis. Re- demonstrated wall thickening of the colon at the level of surgical anastomosis, potentially postprocedural in etiology. Recommend attention on follow-up as underlying mass is not entirely excluded. Electronically Signed   By: DrLovey Newcomer.D.   On: 03/09/2016 10:22   Ct Biopsy  Result Date: 03/10/2016 CLINICAL DATA:  History of lymphoma and ITP. Bone marrow biopsy required to assess for possible lymphoma recurrence versus other bone marrow process. EXAM: CT GUIDED BONE MARROW ASPIRATION AND BIOPSY ANESTHESIA/SEDATION: Versed 1.0 mg IV, Fentanyl 50 mcg IV Total Moderate Sedation Time:  14 minutes. The patient's level of consciousness and physiologic status were continuously monitored during the procedure by Radiology nursing. PROCEDURE: The procedure risks, benefits, and alternatives were explained  to the patient. Questions regarding the procedure were encouraged and answered. The patient understands and consents to the procedure. A time out was performed prior to initiating the procedure. The right gluteal region was prepped with chlorhexidine. Sterile gown and sterile gloves were used for the procedure. Local anesthesia was provided with 1% Lidocaine. Under CT guidance, an 11 gauge On  Control bone cutting needle was advanced from a posterior approach into the right iliac bone. Needle positioning was confirmed with CT. Initial non heparinized and heparinized aspirate samples were obtained of bone marrow. Core biopsy was performed via the On Control drill needle. COMPLICATIONS: None FINDINGS: Inspection of initial aspirate did reveal visible particles. Intact core biopsy sample was obtained. IMPRESSION: CT guided bone marrow biopsy of right posterior iliac bone with both aspirate and core samples obtained. Electronically Signed   By: Aletta Edouard M.D.   On: 03/10/2016 13:04   Ct Bone Marrow Biopsy & Aspiration  Result Date: 03/10/2016 CLINICAL DATA:  History of lymphoma and ITP. Bone marrow biopsy required to assess for possible lymphoma recurrence versus other bone marrow process. EXAM: CT GUIDED BONE MARROW ASPIRATION AND BIOPSY ANESTHESIA/SEDATION: Versed 1.0 mg IV, Fentanyl 50 mcg IV Total Moderate Sedation Time:  14 minutes. The patient's level of consciousness and physiologic status were continuously monitored during the procedure by Radiology nursing. PROCEDURE: The procedure risks, benefits, and alternatives were explained to the patient. Questions regarding the procedure were encouraged and answered. The patient understands and consents to the procedure. A time out was performed prior to initiating the procedure. The right gluteal region was prepped with chlorhexidine. Sterile gown and sterile gloves were used for the procedure. Local anesthesia was provided with 1% Lidocaine. Under CT guidance, an 11 gauge On Control bone cutting needle was advanced from a posterior approach into the right iliac bone. Needle positioning was confirmed with CT. Initial non heparinized and heparinized aspirate samples were obtained of bone marrow. Core biopsy was performed via the On Control drill needle. COMPLICATIONS: None FINDINGS: Inspection of initial aspirate did reveal visible particles. Intact  core biopsy sample was obtained. IMPRESSION: CT guided bone marrow biopsy of right posterior iliac bone with both aspirate and core samples obtained. Electronically Signed   By: Aletta Edouard M.D.   On: 03/10/2016 13:04    Discharge Laboratory Values: Lab Results  Component Value Date   WBC 5.4 03/18/2016   HGB 10.5 (L) 03/18/2016   HCT 31.4 (L) 03/18/2016   MCV 94.3 03/18/2016   PLT 17 (LL) 03/18/2016   Lab Results  Component Value Date   NA 137 03/18/2016   K 3.6 03/18/2016   CL 99 (L) 03/18/2016   CO2 27 03/18/2016    Brief H and P: For complete details please refer to admission H and P, but in brief, The patient was admitted due to active bleeding and progressive pancytopenia. Please see problem oriented charting below:  Recurrent classical Hodgkin lymphoma Recent PET CT scan showedhyper intense metabolic activity in the bone marrow area  Bone marrow biopsy from 03/10/16 is reviewed with the pathologist which show greater than 50% bone marrow involvement from Hodgkin lymphoma She has received cycle 1 of chemotherapy on 03/13/2016 Continue supportive care  Pancytopenia She has received 2 units of blood transfusion on 03/08/2016 No further transfusion unless hemoglobin less than 8 Continue to monitor platelet count carefully and transfuse if less than 15,000 Neutropenia has recurred, responded to G-CSF She had received multiple units of platelet transfusion, less frequent since  chemotherapy. On the day of discharge on 03/18/2016, she received 1 more unit of platelet transfusion  Exarcebation of congestive heart failure with cough and orthopnea Add lasix BID, she appears to be doing well and diuresis and well She will continue diuretic therapy at home  Hypokalemia due to diuretic therapy, resolved  Encourage potassium rich diet  Elevated liver enzymes and leg swelling CT scan of the abdomen did not reveal the cause of abnormal liver enzymes. Incidentally, there is  some mesenteric edema AppreciateGI for evaluation Could be due to volume overload and CHF Liver enzymes are improving. Hyperbilirubin is likely due to recent transfusion She received reduced dose chemotherapy, adjusted based on recent liver dysfunction She will go home on TED hose for leg swelling  Poor oral intake, moderate protein calorie malnutrition This is likely due to cancer relapse Improved by the time of discharge  Significant weakness Continue physical therapyand encourage ambulation Will continue home PT  Constipation Encourage fruit intake  Hemorrhoid irritation Due to neutropenia, I recommend avoiding suppository or Anusol Tylenol as needed for mildpain I recommend addition of sitz baths   Mucositis due to chemotherapy, resolved She has responded to GCSF and magic mouth wash  DVT prophylaxis Lovenox on hold due to low platelet count  CODE STATUS Full code  Goals of care discussion The patient is aware shehas incurabledisease and treatment is strictly palliative. We discussed importance of Advanced Directives and Living will. I will get assistance from our social worker to help herfill out some paperwork. We discussed CODE STATUS; the patient desires to remain in full code. She appointed her husband as medical healthcare power of attorney  Discharge planning She will be discharged today. Face-to-face encounter for home health has been arranged  Physical Exam at Discharge: BP (!) 142/70 (BP Location: Right Arm)   Pulse (!) 101   Temp 97.9 F (36.6 C) (Oral)   Resp 18   Ht 5' 4"  (1.626 m)   Wt 220 lb 3.8 oz (99.9 kg)   LMP 02/10/1970   SpO2 97%   BMI 37.80 kg/m  GENERAL:alert, no distress and comfortable SKIN: skin color, texture, turgor are normal, no rashes or significant lesions. Noted skin bruising  EYES: normal, Conjunctiva are pink and non-injected, sclera clear OROPHARYNX:no exudate, no erythema and lips, buccal mucosa, and  tongue normal  NECK: supple, thyroid normal size, non-tender, without nodularity LYMPH:  no palpable lymphadenopathy in the cervical, axillary or inguinal LUNGS: clear to auscultation and percussion with normal breathing effort HEART: regular rate & rhythm and no murmurs and no lower extremity edema ABDOMEN:abdomen soft, non-tender and normal bowel sounds Musculoskeletal:no cyanosis of digits and no clubbing  NEURO: alert & oriented x 3 with fluent speech, no focal motor/sensory deficits  Hospital Course:  Active Problems:   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 [name] [phone] I spent 40 minutes on discharge  03/18/2016, 9:00 AM

## 2016-03-19 ENCOUNTER — Encounter (HOSPITAL_COMMUNITY): Payer: Self-pay | Admitting: Emergency Medicine

## 2016-03-19 ENCOUNTER — Ambulatory Visit (HOSPITAL_COMMUNITY)
Admission: RE | Admit: 2016-03-19 | Discharge: 2016-03-19 | Disposition: A | Discharge status: Home / Self Care | Payer: Medicare Other | Source: Ambulatory Visit | Attending: Hematology and Oncology | Admitting: Hematology and Oncology

## 2016-03-19 ENCOUNTER — Telehealth: Payer: Self-pay | Admitting: Family Medicine

## 2016-03-19 ENCOUNTER — Emergency Department (HOSPITAL_COMMUNITY)
Admission: EM | Admit: 2016-03-19 | Discharge: 2016-03-19 | Disposition: A | Discharge status: Home / Self Care | Payer: Medicare Other | Attending: Emergency Medicine | Admitting: Emergency Medicine

## 2016-03-19 ENCOUNTER — Other Ambulatory Visit: Payer: Self-pay | Admitting: *Deleted

## 2016-03-19 DIAGNOSIS — Z79899 Other long term (current) drug therapy: Secondary | ICD-10-CM | POA: Insufficient documentation

## 2016-03-19 DIAGNOSIS — E039 Hypothyroidism, unspecified: Secondary | ICD-10-CM | POA: Insufficient documentation

## 2016-03-19 DIAGNOSIS — K644 Residual hemorrhoidal skin tags: Secondary | ICD-10-CM | POA: Insufficient documentation

## 2016-03-19 DIAGNOSIS — I110 Hypertensive heart disease with heart failure: Secondary | ICD-10-CM | POA: Insufficient documentation

## 2016-03-19 DIAGNOSIS — Z85828 Personal history of other malignant neoplasm of skin: Secondary | ICD-10-CM | POA: Insufficient documentation

## 2016-03-19 DIAGNOSIS — I5042 Chronic combined systolic (congestive) and diastolic (congestive) heart failure: Secondary | ICD-10-CM | POA: Insufficient documentation

## 2016-03-19 DIAGNOSIS — Z85038 Personal history of other malignant neoplasm of large intestine: Secondary | ICD-10-CM | POA: Insufficient documentation

## 2016-03-19 LAB — PREPARE PLATELET PHERESIS: Unit division: 0

## 2016-03-19 MED ORDER — OXYCODONE HCL 5 MG PO TABS
5.0000 mg | ORAL_TABLET | Freq: Once | ORAL | Status: AC
Start: 2016-03-19 — End: 2016-03-19
  Administered 2016-03-19: 5 mg via ORAL
  Filled 2016-03-19: qty 1

## 2016-03-19 NOTE — Patient Outreach (Signed)
Error.   Devontay Celaya M.   Millan Legan RN CCM [facility] Care Management  [phone]  

## 2016-03-19 NOTE — ED Triage Notes (Signed)
Patient is complaining of hemorrhoids x 3 weeks. Patient is being treated for lymphoma. Last chemo was on Thursday.

## 2016-03-19 NOTE — ED Provider Notes (Signed)
[facility] DEPT Provider Note   CSN: [id number] Arrival date & time: 03/19/16  1833     History   Chief Complaint Chief Complaint  Patient presents with  . Hemorrhoids    HPI Diamond Boyd is a 76 y.o. female.  76 yo F with a chief complaint of painful hemorrhoids. Going on for the past 3 weeks. Mildly worsening today. Said she had some trace blood on the toilet paper when she wiped. This going through chemotherapy for Hodgkin's lymphoma. Is been noted to be neutropenic, as well as required multiple blood and platelet transfusions. Discussed the hospital yesterday. Was started on a rectal foam for her hemorrhoids.   The history is provided by the patient and the spouse.  Illness  This is a new problem. The current episode started 2 days ago. The problem occurs constantly. The problem has not changed since onset.Pertinent negatives include no chest pain, no headaches and no shortness of breath. Nothing aggravates the symptoms. Nothing relieves the symptoms. She has tried nothing for the symptoms. The treatment provided no relief.    Past Medical History:  Diagnosis Date  . Anemia    hx blood tx  . Asthma   . Benign essential HTN   . Cancer ([place])    skin cancer- basal cell on arm / colon 2011  . Chronic systolic CHF (congestive heart failure), NYHA class 2 ([place]) 01/2015  . Colon cancer ([place])    2011, s/p surgery, in remission  . Colon polyps   . Degenerative disk disease    Thoracic spine  . Depression   . 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 Generalized weakness 05/11/2015  . GERD (gastroesophageal reflux disease)   . Heart murmur   . hodgkins lymphoma   . Hypothyroidism   . Lymphoma ([place])   . Neuropathy due to chemotherapeutic drug ([place]) 06/19/2014  . NICM (nonischemic cardiomyopathy) ([place]) 01/2015  . Osteoarthritis    hands  . 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  PAF (paroxysmal atrial fibrillation) ([place])   . Peripheral vascular disease ([place])   . Pneumonia     Patient Active Problem List   Diagnosis Date Noted  . Goals of care, counseling/discussion 03/13/2016  . 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 Difficulty in walking, not elsewhere classified   . Melena   . Dyspnea   . Acute blood loss anemia 02/14/2016  . Chronic combined systolic and diastolic CHF (congestive heart failure) ([place]) 02/14/2016  . Pancytopenia, acquired ([place]) 01/17/2016  . Dehydration 12/31/2015  . Neoplastic (malignant) related fatigue 12/31/2015  . Unintentional weight loss 12/31/2015  . Epistaxis 12/31/2015  . Hypoalbuminemia due to protein-calorie malnutrition ([place]) 12/31/2015  . C. difficile colitis   . Colitis 12/11/2015  . Basal cell carcinoma 10/31/2015  . Neuropathy ([place]) 10/31/2015  . S/P cataract surgery 10/31/2015  . Chronic rhinitis 10/31/2015  . Moderate persistent asthma 10/31/2015  . Physical deconditioning 10/16/2015  . Chronic ethmoidal sinusitis 09/18/2015  . 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 Cough 07/18/2015  . Port catheter in place 07/11/2015  . Generalized weakness 05/11/2015  . Benign essential HTN   . PAF (paroxysmal atrial fibrillation) ([place])   . Internal hemorrhoids 02/21/2015  . Rectal bleeding 02/06/2015  . 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 Thrombocytopenia ([place]) 01/29/2015  . AKI (acute kidney injury) ([place])   . Elevated troponin   . Atrial fibrillation with rapid  ventricular response ([place]) 01/23/2015  . New onset a-fib ([place]) 01/23/2015  . Hypertension 01/23/2015  . CHF (congestive heart failure) ([place]) 01/21/2015  . Chronic systolic CHF (congestive heart failure), NYHA class 2 ([place]) 01/11/2015  . Fever in adult 01/10/2015  . Immunocompromised status associated with infection ([place]) 01/08/2015  . Grade 3b follicular lymphoma of lymph nodes of multiple regions ([place]) 12/28/2014  . Follicular lymphoma of lymph  nodes of multiple regions ([place]) 12/11/2014  . Preventive measure 11/14/2014  . Parotid mass 11/14/2014  . Chronic lower back pain 09/28/2014  . Mediastinal lymphadenopathy 09/27/2014  . 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 Peripheral neuropathy due to chemotherapy ([place]) 04/03/2014  . Trochanteric bursitis of right hip 04/03/2014  . Anemia in chronic illness 02/21/2014  . Elevated liver enzymes 02/21/2014  . Urinary tract infection 11/18/2013  . Candidal intertrigo 09/23/2013  . Atrophic vaginitis 09/23/2013  . Chemotherapy induced neutropenia ([place]) 09/01/2013  . History of colon cancer 06/17/2013  . Hodgkin's lymphoma in relapse ([place]) 05/20/2013  . Conjunctivitis, acute 04/27/2013  . Cold sore 04/27/2013  . Hyperglycemia 03/11/2013  . Hodgkin lymphoma ([place]) 02/10/2013  . Acute maxillary sinusitis 02/09/2013  . Other malaise and fatigue 01/04/2013  . Shingles 11/22/2012  . Cystocele 09/22/2012  . Encounter for Medicare annual wellness exam 09/17/2012  . Left knee pain 09/10/2012  . Thoracic back pain 06/01/2012  . Skin lesion of face 05/17/2012  . Other screening mammogram 07/22/2011  . Routine gynecological examination 07/22/2011  . Colon cancer screening 07/22/2011  . History of anemia 05/06/2011  . 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 Hypothyroidism 02/24/2011  . Varicose veins 02/24/2011  . Obesity 11/20/2010  . Colon cancer ([place]) 08/23/2009  . Hyperphosphatemia 08/09/2009  . Chronic maxillary sinusitis 07/03/2009  . THROAT PAIN, CHRONIC 07/03/2009  . S/P colon resection 02/10/2009  . GERD 12/20/2008  . Anxiety state 08/22/2008  . MENOPAUSAL SYNDROME 07/13/2007  . Hyperlipidemia 07/08/2006  . Allergic rhinitis 07/08/2006  . OVERACTIVE BLADDER 07/08/2006  . 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 HYPOTHYROIDISM 06/25/2006  . Depression 06/25/2006  . PERIPHERAL VASCULAR DISEASE 06/25/2006  . OSTEOARTHRITIS 06/25/2006  . LEG EDEMA 06/25/2006    . SKIN CANCER, HX OF 06/25/2006  . COLONIC POLYPS, HX OF 06/25/2006    Past Surgical History:  Procedure Laterality Date  . BONE MARROW BIOPSY  05/27/13  . BREAST BIOPSY  1996  . CARDIAC CATHETERIZATION N/A 01/25/2015   Procedure: Left Heart Cath and Coronary Angiography;  Surgeon: Peter M Martinique, MD;  Location: [facility];  Service: Cardiovascular;  Laterality: N/A;  . CATARACT EXTRACTION, BILATERAL  2016  . CHOLECYSTECTOMY    . COLECTOMY  8/11  . COLONOSCOPY W/ BIOPSIES    . DG BIOPSY LUNG    . LYMPH NODE BIOPSY N/A 11/01/2014   Procedure: MEDIASTINAL LYMPH NODE BIOPSY;  Surgeon: Grace Isaac, MD;  Location: [facility];  Service: Thoracic;  Laterality: N/A;  . MEDIASTINOSCOPY N/A 11/01/2014   Procedure: MEDIASTINOSCOPY;  Surgeon: Grace Isaac, MD;  Location: [facility];  Service: Thoracic;  Laterality: N/A;  . PLANTAR FASCIA RELEASE    . port-a-cath insertion    . SINUS ENDO W/FUSION Bilateral 02/20/2016   Procedure: BILATERAL MAXILLARY ANTROSTOMY, BILATERAL ANTERIOR ETHMOIDECTOMY, BILATERAL FRONTAL RECESS EXPLORATION, FUSION IMAGE GUIDANCE;  Surgeon: Melida Quitter, MD;  Location: [facility];  Service: ENT;  Laterality: Bilateral;  . SPLENECTOMY     lymphoma  . TENDON RELEASE  Right thumb   . VAGINAL HYSTERECTOMY    . VENTRAL HERNIA REPAIR  1998  . VIDEO BRONCHOSCOPY WITH ENDOBRONCHIAL ULTRASOUND N/A 10/17/2014   Procedure: VIDEO BRONCHOSCOPY WITH ENDOBRONCHIAL ULTRASOUND;  Surgeon: Javier Glazier, MD;  Location: [facility];  Service: Thoracic;  Laterality: N/A;  . VIDEO BRONCHOSCOPY WITH ENDOBRONCHIAL ULTRASOUND N/A 11/01/2014   Procedure: VIDEO BRONCHOSCOPY WITH ENDOBRONCHIAL ULTRASOUND;  Surgeon: Grace Isaac, MD;  Location: [facility];  Service: Thoracic;  Laterality: N/A;    OB History    No data available       Home Medications    Prior to Admission medications   Medication Sig Start Date End Date Taking? Authorizing Provider  acetaminophen (TYLENOL) 650 MG CR  tablet Take 650 mg by mouth every 8 (eight) hours as needed for pain or fever.     Historical Provider, MD  acyclovir (ZOVIRAX) 400 MG tablet Take 1 tablet (400 mg total) by mouth daily. Patient taking differently: Take 400 mg by mouth every morning.  10/29/15   Abner Greenspan, MD  Albuterol Sulfate (PROAIR RESPICLICK) 597 (90 Base) MCG/ACT AEPB Inhale 1-2 puffs into the lungs every 6 (six) hours as needed (SOB, wheezing).     Historical Provider, MD  BREO ELLIPTA 200-25 MCG/INH AEPB Inhale 1 puff into the lungs every evening.  11/22/15   Historical Provider, MD  Calcium Carbonate-Vitamin D (CALCIUM 600+D) 600-400 MG-UNIT per tablet Take 1 tablet by mouth every evening.     Historical Provider, MD  Cholecalciferol (VITAMIN D-3) 1000 UNITS CAPS Take 1,000 Units by mouth every morning.     Historical Provider, MD  estradiol (ESTRACE) 1 MG tablet Take 1 tablet (1 mg total) by mouth daily. Patient taking differently: Take 1 mg by mouth every morning.  10/29/15   Abner Greenspan, MD  fexofenadine (ALLEGRA) 180 MG tablet TAKE 1 TABLET (180 MG TOTAL) BY MOUTH DAILY. (*OTC) 08/01/15   Historical Provider, MD  FLUoxetine (PROZAC) 20 MG capsule Take 1 capsule (20 mg total) by mouth daily. Patient taking differently: Take 20 mg by mouth every morning.  10/29/15   Abner Greenspan, MD  furosemide (LASIX) 20 MG tablet Take 1 tablet (20 mg total) by mouth daily. Take 1 tab by mouth daily for weight gain. Patient taking differently: Take 20 mg by mouth every morning. Take 1 tab by mouth daily for weight gain. 10/29/15   Abner Greenspan, MD  gabapentin (NEURONTIN) 300 MG capsule Take 1 capsule (300 mg total) by mouth 2 (two) times daily. 10/29/15   Abner Greenspan, MD  Lactobacillus (PROBIOTIC ACIDOPHILUS) CAPS Take 1 capsule by mouth every evening.    Historical Provider, MD  levothyroxine (SYNTHROID, LEVOTHROID) 112 MCG tablet Take 1 tablet (112 mcg total) by mouth daily before breakfast. 10/29/15   Abner Greenspan, MD   lidocaine-prilocaine (EMLA) cream Apply 1 application topically as needed (For port-a-cath.). Reported on 02/22/2015    Historical Provider, MD  metoprolol (LOPRESSOR) 25 MG tablet Take 0.5 tablets (12.5 mg total) by mouth 2 (two) times daily. 02/22/16   Venetia Maxon Rama, MD  Multiple Vitamins-Minerals (MULTIVITAMIN ADULTS PO) Take 1 tablet by mouth every evening.    Historical Provider, MD  Omega-3 350 MG CAPS Take 1 capsule by mouth daily. Reported on 02/22/2015    Historical Provider, MD  omeprazole (PRILOSEC) 40 MG capsule Take 40 mg by mouth at bedtime.  01/01/16   Historical Provider, MD  ondansetron (ZOFRAN) 8 MG tablet Take 8 mg  by mouth every 6 (six) hours as needed for nausea or vomiting. Reported on 03/05/2015 12/25/14   Historical Provider, MD  prochlorperazine (COMPAZINE) 10 MG tablet Take 10 mg by mouth every 6 (six) hours as needed for nausea or vomiting. Reported on 03/05/2015 12/25/14   Historical Provider, MD  simvastatin (ZOCOR) 40 MG tablet Take 1 tablet (40 mg total) by mouth at bedtime. 10/29/15   Abner Greenspan, MD  sodium chloride (OCEAN) 0.65 % SOLN nasal spray Place 2 sprays into both nostrils every 2 (two) hours while awake. 02/22/16   Venetia Maxon Rama, MD    Family History Family History  Problem Relation Age of Onset  . Coronary artery disease Mother   . Diabetes Mother   . Fibromyalgia Daughter     chronic pain   . COPD Daughter   . Diabetes Brother   . Asthma Daughter   . Rheum arthritis Brother   . Clotting disorder Brother   . Alcohol abuse Sister   . Colon cancer Neg Hx   . Colon polyps Neg Hx   . Stomach cancer Neg Hx   . Rectal cancer Neg Hx   . Ulcerative colitis Neg Hx   . Crohn's disease Neg Hx     Social History Social History  Substance Use Topics  . Smoking status: Never Smoker  . Smokeless tobacco: Never Used     Comment: Second-hand exposure through father.  . Alcohol use No     Allergies   Achromycin [tetracycline hcl]; Acyclovir and  related; Allopurinol; Astelin [azelastine hcl]; Cephalexin; Codeine; Lisinopril; Meloxicam; Minocycline; Nabumetone; Nyquil [pseudoeph-doxylamine-dm-apap]; Sulfa antibiotics; Zolpidem tartrate; Buspar [buspirone hcl]; and Ciprofloxacin   Review of Systems Review of Systems  Constitutional: Negative for chills and fever.  HENT: Negative for congestion and rhinorrhea.   Eyes: Negative for redness and visual disturbance.  Respiratory: Negative for shortness of breath and wheezing.   Cardiovascular: Negative for chest pain and palpitations.  Gastrointestinal: Positive for rectal pain. Negative for nausea and vomiting.  Genitourinary: Negative for dysuria and urgency.  Musculoskeletal: Negative for arthralgias and myalgias.  Skin: Negative for pallor and wound.  Neurological: Negative for dizziness and headaches.     Physical Exam Updated Vital Signs BP (!) 128/110 (BP Location: Left Arm)   Pulse 118   Temp 98.9 F (37.2 C) (Oral)   Resp 18   Ht _0  (1.626 m)   Wt 220 lb (99.8 kg)   LMP 02/10/1970   SpO2 98%   BMI 37.76 kg/m   Physical Exam  Constitutional: She is oriented to person, place, and time. She appears well-developed and well-nourished. No distress.  HENT:  Head: Normocephalic and atraumatic.  Eyes: EOM are normal. Pupils are equal, round, and reactive to light.  Neck: Normal range of motion. Neck supple.  Cardiovascular: Normal rate and regular rhythm.  Exam reveals no gallop and no friction rub.   No murmur heard. Pulmonary/Chest: Effort normal. She has no wheezes. She has no rales.  Abdominal: Soft. She exhibits no distension and no mass. There is no tenderness. There is no guarding.  Genitourinary:  Genitourinary Comments: 2 small hemorrhoids. Mildly tender. No active bleeding.  Musculoskeletal: She exhibits no edema or tenderness.  Neurological: She is alert and oriented to person, place, and time.  Skin: Skin is warm and dry. She is not diaphoretic.    Psychiatric: She has a normal mood and affect. Her behavior is normal.  Nursing note and vitals reviewed.    ED  Treatments / Results  Labs (all labs ordered are listed, but only abnormal results are displayed) Labs Reviewed - No data to display  EKG  EKG Interpretation None       Radiology No results found.  Procedures Procedures (including critical care time)  Medications Ordered in ED Medications  oxyCODONE (Oxy IR/ROXICODONE) immediate release tablet 5 mg (not administered)     Initial Impression / Assessment and Plan / ED Course  I have reviewed the triage vital signs and the nursing notes.  Pertinent labs & imaging results that were available during my care of the patient were reviewed by me and considered in my medical decision making (see chart for details).     54 yoF With painful hemorrhoids. Suggest that she follow-up with her family doctor and general surgery as needed. We'll have her continue her current therapy. PCP follow-up.  7:15 PM:  I have discussed the diagnosis/risks/treatment options with the patient and family and believe the pt to be eligible for discharge home to follow-up with PCP, Gen surgery. We also discussed returning to the ED immediately if new or worsening sx occur. We discussed the sx which are most concerning (e.g., sudden worsening pain, fever, inability to tolerate by mouth) that necessitate immediate return. Medications administered to the patient during their visit and any new prescriptions provided to the patient are listed below.  Medications given during this visit Medications  oxyCODONE (Oxy IR/ROXICODONE) immediate release tablet 5 mg (not administered)     The patient appears reasonably screen and/or stabilized for discharge and I doubt any other medical condition or other [facility] requiring further screening, evaluation, or treatment in the ED at this time prior to discharge.    Final Clinical Impressions(s) / ED Diagnoses    Final diagnoses:  External hemorrhoids    New Prescriptions New Prescriptions   No medications on file     Deno Etienne, DO 03/19/16 1915

## 2016-03-19 NOTE — Discharge Instructions (Signed)
Try aloe vera gel.  Follow up with your PCP and general surgery as needed.

## 2016-03-19 NOTE — Patient Outreach (Addendum)
Attempt made to contact pt, follow up on referral form [facility] hospital liaison - follow pt for transition of care, recent hospitalization 1/18-8/6 Follicular lymphoma of lymph nodes of multiple regions, Pancytopenia, elevated liver enzymes, CHF, Hypogammaglobulinemia, Hypoalbuminemia due to protein-calorie malnutrition.  Unable to leave a voice message as phone kept ringing.   Plan: RN CM to call again tomorrow as part of ongoing  transition of care.   Diamond Boyd.   [facility]  [phone]  No addendum done.

## 2016-03-19 NOTE — Telephone Encounter (Signed)
[facility]  Patient Name: Diamond Boyd  DOB: Nov 20, 1940    Initial Comment Caller States their wife is in pain from her hemorrhoids.   Nurse Assessment  Nurse: Harlow Mares, RN, Suanne Marker Date/Time (Eastern Time): 03/19/2016 5:11:58 PM  Confirm and document reason for call. If symptomatic, describe symptoms. ---Caller States their wife is in pain from her hemorrhoids. Pain has increased today, even after sitz bath. Had platelets yesterday, no bleeding noted. DC from hospital yesterday for low platelet count and low WBC, RBC  Does the patient have any new or worsening symptoms? ---Yes  Will a triage be completed? ---Yes  Related visit to physician within the last 2 weeks? ---Yes  Does the PT have any chronic conditions? (i.e. diabetes, asthma, etc.) ---Yes  List chronic conditions. ---Hosgkins lymphoma  Is this a behavioral health or substance abuse call? ---No     Guidelines    Guideline Title Affirmed Question Affirmed Notes  Rectal Symptoms SEVERE rectal pain (e.g., excruciating, unable to have a bowel movement)    Final Disposition User   See Physician within 4 Hours (or PCP triage) Harlow Mares, RN, Rhonda    Referrals  GO TO FACILITY UNDECIDED   Disagree/Comply: Comply

## 2016-03-20 ENCOUNTER — Other Ambulatory Visit: Payer: Self-pay | Admitting: *Deleted

## 2016-03-20 ENCOUNTER — Encounter (HOSPITAL_COMMUNITY): Payer: Self-pay

## 2016-03-20 DIAGNOSIS — C8228 Follicular lymphoma grade III, unspecified, lymph nodes of multiple sites: Secondary | ICD-10-CM | POA: Diagnosis not present

## 2016-03-20 DIAGNOSIS — R2681 Unsteadiness on feet: Secondary | ICD-10-CM | POA: Diagnosis not present

## 2016-03-20 DIAGNOSIS — J320 Chronic maxillary sinusitis: Secondary | ICD-10-CM | POA: Diagnosis not present

## 2016-03-20 DIAGNOSIS — D630 Anemia in neoplastic disease: Secondary | ICD-10-CM | POA: Diagnosis not present

## 2016-03-20 DIAGNOSIS — J449 Chronic obstructive pulmonary disease, unspecified: Secondary | ICD-10-CM | POA: Diagnosis not present

## 2016-03-20 DIAGNOSIS — M6281 Muscle weakness (generalized): Secondary | ICD-10-CM | POA: Diagnosis not present

## 2016-03-20 NOTE — Patient Outreach (Signed)
Second attempt made to contact pt, follow up on referral from [facility] liaison to follow pt for transition of care, recent hospitalization 1/82-9/9 Follicular lynphoma of lymph nodes of multiple regions, Pancytopenia, elevated liver enzymes, CHF, Hypgammaglobulinemia, Hypoalbuminemia due to protein calorie malnutrition.  HIPAA compliant voice message left with contact name and number.    Plan:  If no response, plan to follow up again within next 1-4 days.     Zara Chess.   [facility]  [phone]

## 2016-03-20 NOTE — Telephone Encounter (Signed)
Pt went to the ED yesterday 03/19/2016.

## 2016-03-21 ENCOUNTER — Inpatient Hospital Stay (HOSPITAL_COMMUNITY)
Admission: AD | Admit: 2016-03-21 | Discharge: 2016-03-28 | DRG: 808 | Disposition: A | Discharge status: Hospice | Payer: Medicare Other | Source: Ambulatory Visit | Attending: Internal Medicine | Admitting: Internal Medicine

## 2016-03-21 ENCOUNTER — Ambulatory Visit (HOSPITAL_BASED_OUTPATIENT_CLINIC_OR_DEPARTMENT_OTHER): Payer: Medicare Other

## 2016-03-21 ENCOUNTER — Encounter: Payer: Medicare Other | Admitting: Hematology and Oncology

## 2016-03-21 ENCOUNTER — Ambulatory Visit (HOSPITAL_COMMUNITY)
Admission: RE | Admit: 2016-03-21 | Discharge: 2016-03-21 | Disposition: A | Discharge status: Home / Self Care | Payer: Medicare Other | Source: Ambulatory Visit | Attending: Hematology and Oncology | Admitting: Hematology and Oncology

## 2016-03-21 ENCOUNTER — Other Ambulatory Visit: Payer: Medicare Other

## 2016-03-21 ENCOUNTER — Other Ambulatory Visit (HOSPITAL_BASED_OUTPATIENT_CLINIC_OR_DEPARTMENT_OTHER): Payer: Medicare Other

## 2016-03-21 VITALS — BP 99/49 | HR 102 | Temp 98.2°F | Resp 17 | Ht 64.0 in | Wt 216.6 lb

## 2016-03-21 DIAGNOSIS — E876 Hypokalemia: Secondary | ICD-10-CM

## 2016-03-21 DIAGNOSIS — I4891 Unspecified atrial fibrillation: Secondary | ICD-10-CM | POA: Diagnosis not present

## 2016-03-21 DIAGNOSIS — Z95828 Presence of other vascular implants and grafts: Secondary | ICD-10-CM

## 2016-03-21 DIAGNOSIS — C8298 Follicular lymphoma, unspecified, lymph nodes of multiple sites: Secondary | ICD-10-CM | POA: Diagnosis not present

## 2016-03-21 DIAGNOSIS — C8190 Hodgkin lymphoma, unspecified, unspecified site: Secondary | ICD-10-CM

## 2016-03-21 DIAGNOSIS — R600 Localized edema: Secondary | ICD-10-CM | POA: Diagnosis not present

## 2016-03-21 DIAGNOSIS — I110 Hypertensive heart disease with heart failure: Secondary | ICD-10-CM | POA: Diagnosis present

## 2016-03-21 DIAGNOSIS — R748 Abnormal levels of other serum enzymes: Secondary | ICD-10-CM | POA: Diagnosis present

## 2016-03-21 DIAGNOSIS — I428 Other cardiomyopathies: Secondary | ICD-10-CM | POA: Diagnosis present

## 2016-03-21 DIAGNOSIS — D701 Agranulocytosis secondary to cancer chemotherapy: Principal | ICD-10-CM | POA: Diagnosis present

## 2016-03-21 DIAGNOSIS — K6289 Other specified diseases of anus and rectum: Secondary | ICD-10-CM | POA: Diagnosis not present

## 2016-03-21 DIAGNOSIS — I959 Hypotension, unspecified: Secondary | ICD-10-CM | POA: Diagnosis present

## 2016-03-21 DIAGNOSIS — Z8249 Family history of ischemic heart disease and other diseases of the circulatory system: Secondary | ICD-10-CM

## 2016-03-21 DIAGNOSIS — Z515 Encounter for palliative care: Secondary | ICD-10-CM

## 2016-03-21 DIAGNOSIS — K640 First degree hemorrhoids: Secondary | ICD-10-CM

## 2016-03-21 DIAGNOSIS — B999 Unspecified infectious disease: Secondary | ICD-10-CM | POA: Diagnosis present

## 2016-03-21 DIAGNOSIS — R000 Tachycardia, unspecified: Secondary | ICD-10-CM

## 2016-03-21 DIAGNOSIS — I493 Ventricular premature depolarization: Secondary | ICD-10-CM | POA: Diagnosis present

## 2016-03-21 DIAGNOSIS — R531 Weakness: Secondary | ICD-10-CM | POA: Diagnosis not present

## 2016-03-21 DIAGNOSIS — E8809 Other disorders of plasma-protein metabolism, not elsewhere classified: Secondary | ICD-10-CM

## 2016-03-21 DIAGNOSIS — K649 Unspecified hemorrhoids: Secondary | ICD-10-CM

## 2016-03-21 DIAGNOSIS — M7989 Other specified soft tissue disorders: Secondary | ICD-10-CM | POA: Diagnosis not present

## 2016-03-21 DIAGNOSIS — R0902 Hypoxemia: Secondary | ICD-10-CM

## 2016-03-21 DIAGNOSIS — R059 Cough, unspecified: Secondary | ICD-10-CM

## 2016-03-21 DIAGNOSIS — I447 Left bundle-branch block, unspecified: Secondary | ICD-10-CM | POA: Diagnosis present

## 2016-03-21 DIAGNOSIS — R5081 Fever presenting with conditions classified elsewhere: Secondary | ICD-10-CM | POA: Diagnosis not present

## 2016-03-21 DIAGNOSIS — Z66 Do not resuscitate: Secondary | ICD-10-CM | POA: Diagnosis not present

## 2016-03-21 DIAGNOSIS — D630 Anemia in neoplastic disease: Secondary | ICD-10-CM | POA: Diagnosis not present

## 2016-03-21 DIAGNOSIS — I739 Peripheral vascular disease, unspecified: Secondary | ICD-10-CM | POA: Diagnosis not present

## 2016-03-21 DIAGNOSIS — D899 Disorder involving the immune mechanism, unspecified: Secondary | ICD-10-CM | POA: Diagnosis present

## 2016-03-21 DIAGNOSIS — E119 Type 2 diabetes mellitus without complications: Secondary | ICD-10-CM

## 2016-03-21 DIAGNOSIS — C8228 Follicular lymphoma grade III, unspecified, lymph nodes of multiple sites: Secondary | ICD-10-CM | POA: Diagnosis not present

## 2016-03-21 DIAGNOSIS — M5134 Other intervertebral disc degeneration, thoracic region: Secondary | ICD-10-CM | POA: Diagnosis not present

## 2016-03-21 DIAGNOSIS — R0989 Other specified symptoms and signs involving the circulatory and respiratory systems: Secondary | ICD-10-CM

## 2016-03-21 DIAGNOSIS — I471 Supraventricular tachycardia: Secondary | ICD-10-CM | POA: Diagnosis present

## 2016-03-21 DIAGNOSIS — Z85828 Personal history of other malignant neoplasm of skin: Secondary | ICD-10-CM

## 2016-03-21 DIAGNOSIS — I480 Paroxysmal atrial fibrillation: Secondary | ICD-10-CM | POA: Diagnosis present

## 2016-03-21 DIAGNOSIS — F329 Major depressive disorder, single episode, unspecified: Secondary | ICD-10-CM | POA: Diagnosis present

## 2016-03-21 DIAGNOSIS — G63 Polyneuropathy in diseases classified elsewhere: Secondary | ICD-10-CM | POA: Diagnosis not present

## 2016-03-21 DIAGNOSIS — R05 Cough: Secondary | ICD-10-CM | POA: Diagnosis not present

## 2016-03-21 DIAGNOSIS — D61818 Other pancytopenia: Secondary | ICD-10-CM | POA: Diagnosis not present

## 2016-03-21 DIAGNOSIS — D709 Neutropenia, unspecified: Secondary | ICD-10-CM

## 2016-03-21 DIAGNOSIS — Z85038 Personal history of other malignant neoplasm of large intestine: Secondary | ICD-10-CM

## 2016-03-21 DIAGNOSIS — K1230 Oral mucositis (ulcerative), unspecified: Secondary | ICD-10-CM | POA: Diagnosis present

## 2016-03-21 DIAGNOSIS — I509 Heart failure, unspecified: Secondary | ICD-10-CM

## 2016-03-21 DIAGNOSIS — I2510 Atherosclerotic heart disease of native coronary artery without angina pectoris: Secondary | ICD-10-CM | POA: Diagnosis present

## 2016-03-21 DIAGNOSIS — K5900 Constipation, unspecified: Secondary | ICD-10-CM | POA: Diagnosis present

## 2016-03-21 DIAGNOSIS — M19041 Primary osteoarthritis, right hand: Secondary | ICD-10-CM | POA: Diagnosis not present

## 2016-03-21 DIAGNOSIS — E039 Hypothyroidism, unspecified: Secondary | ICD-10-CM | POA: Diagnosis present

## 2016-03-21 DIAGNOSIS — R0602 Shortness of breath: Secondary | ICD-10-CM | POA: Diagnosis not present

## 2016-03-21 DIAGNOSIS — J45909 Unspecified asthma, uncomplicated: Secondary | ICD-10-CM | POA: Diagnosis not present

## 2016-03-21 DIAGNOSIS — Z86010 Personal history of colonic polyps: Secondary | ICD-10-CM

## 2016-03-21 DIAGNOSIS — Z888 Allergy status to other drugs, medicaments and biological substances status: Secondary | ICD-10-CM

## 2016-03-21 DIAGNOSIS — E46 Unspecified protein-calorie malnutrition: Secondary | ICD-10-CM | POA: Diagnosis present

## 2016-03-21 DIAGNOSIS — K219 Gastro-esophageal reflux disease without esophagitis: Secondary | ICD-10-CM | POA: Diagnosis present

## 2016-03-21 DIAGNOSIS — Z9221 Personal history of antineoplastic chemotherapy: Secondary | ICD-10-CM

## 2016-03-21 DIAGNOSIS — E440 Moderate protein-calorie malnutrition: Secondary | ICD-10-CM | POA: Diagnosis present

## 2016-03-21 DIAGNOSIS — I440 Atrioventricular block, first degree: Secondary | ICD-10-CM | POA: Diagnosis present

## 2016-03-21 DIAGNOSIS — R7989 Other specified abnormal findings of blood chemistry: Secondary | ICD-10-CM | POA: Diagnosis present

## 2016-03-21 DIAGNOSIS — I5033 Acute on chronic diastolic (congestive) heart failure: Secondary | ICD-10-CM | POA: Diagnosis not present

## 2016-03-21 DIAGNOSIS — I429 Cardiomyopathy, unspecified: Secondary | ICD-10-CM | POA: Diagnosis not present

## 2016-03-21 DIAGNOSIS — I5043 Acute on chronic combined systolic (congestive) and diastolic (congestive) heart failure: Secondary | ICD-10-CM | POA: Diagnosis not present

## 2016-03-21 DIAGNOSIS — R630 Anorexia: Secondary | ICD-10-CM | POA: Diagnosis not present

## 2016-03-21 DIAGNOSIS — E099 Drug or chemical induced diabetes mellitus without complications: Secondary | ICD-10-CM | POA: Diagnosis present

## 2016-03-21 DIAGNOSIS — J3489 Other specified disorders of nose and nasal sinuses: Secondary | ICD-10-CM | POA: Diagnosis not present

## 2016-03-21 DIAGNOSIS — C8178 Other classical Hodgkin lymphoma, lymph nodes of multiple sites: Secondary | ICD-10-CM | POA: Diagnosis not present

## 2016-03-21 DIAGNOSIS — R948 Abnormal results of function studies of other organs and systems: Secondary | ICD-10-CM

## 2016-03-21 DIAGNOSIS — K1231 Oral mucositis (ulcerative) due to antineoplastic therapy: Secondary | ICD-10-CM

## 2016-03-21 DIAGNOSIS — R739 Hyperglycemia, unspecified: Secondary | ICD-10-CM

## 2016-03-21 DIAGNOSIS — Z885 Allergy status to narcotic agent status: Secondary | ICD-10-CM

## 2016-03-21 DIAGNOSIS — T380X5A Adverse effect of glucocorticoids and synthetic analogues, initial encounter: Secondary | ICD-10-CM | POA: Diagnosis present

## 2016-03-21 DIAGNOSIS — I5022 Chronic systolic (congestive) heart failure: Secondary | ICD-10-CM | POA: Diagnosis not present

## 2016-03-21 DIAGNOSIS — Z7951 Long term (current) use of inhaled steroids: Secondary | ICD-10-CM

## 2016-03-21 DIAGNOSIS — D8481 Immunodeficiency due to conditions classified elsewhere: Secondary | ICD-10-CM | POA: Diagnosis present

## 2016-03-21 LAB — CBC WITH DIFFERENTIAL/PLATELET
BASO%: 0.6 % (ref 0.0–2.0)
Basophils Absolute: 0 10*3/uL (ref 0.0–0.1)
EOS%: 0 % (ref 0.0–7.0)
Eosinophils Absolute: 0 10*3/uL (ref 0.0–0.5)
HEMATOCRIT: 26.3 % — AB (ref 34.8–46.6)
HEMOGLOBIN: 8.6 g/dL — AB (ref 11.6–15.9)
LYMPH#: 1.2 10*3/uL (ref 0.9–3.3)
LYMPH%: 77.3 % — ABNORMAL HIGH (ref 14.0–49.7)
MCH: 30.8 pg (ref 25.1–34.0)
MCHC: 32.7 g/dL (ref 31.5–36.0)
MCV: 94.3 fL (ref 79.5–101.0)
MONO#: 0.1 10*3/uL (ref 0.1–0.9)
MONO%: 7.8 % (ref 0.0–14.0)
NEUT%: 14.3 % — ABNORMAL LOW (ref 38.4–76.8)
NEUTROS ABS: 0.2 10*3/uL — AB (ref 1.5–6.5)
Platelets: 29 10*3/uL — ABNORMAL LOW (ref 145–400)
RBC: 2.79 10*6/uL — ABNORMAL LOW (ref 3.70–5.45)
RDW: 21 % — AB (ref 11.2–14.5)
WBC: 1.5 10*3/uL — AB (ref 3.9–10.3)
nRBC: 7 % — ABNORMAL HIGH (ref 0–0)

## 2016-03-21 LAB — URINALYSIS, ROUTINE W REFLEX MICROSCOPIC
Bilirubin Urine: NEGATIVE
Glucose, UA: NEGATIVE mg/dL
Hgb urine dipstick: NEGATIVE
KETONES UR: NEGATIVE mg/dL
LEUKOCYTES UA: NEGATIVE
NITRITE: NEGATIVE
PROTEIN: NEGATIVE mg/dL
Specific Gravity, Urine: 1.003 — ABNORMAL LOW (ref 1.005–1.030)
pH: 6 (ref 5.0–8.0)

## 2016-03-21 LAB — COMPREHENSIVE METABOLIC PANEL
ALT: 26 U/L (ref 0–55)
AST: 30 U/L (ref 5–34)
Albumin: 2.4 g/dL — ABNORMAL LOW (ref 3.5–5.0)
Alkaline Phosphatase: 345 U/L — ABNORMAL HIGH (ref 40–150)
Anion Gap: 9 mEq/L (ref 3–11)
BUN: 8.6 mg/dL (ref 7.0–26.0)
CALCIUM: 8.8 mg/dL (ref 8.4–10.4)
CHLORIDE: 100 meq/L (ref 98–109)
CO2: 25 mEq/L (ref 22–29)
CREATININE: 0.8 mg/dL (ref 0.6–1.1)
EGFR: 71 mL/min/{1.73_m2} — ABNORMAL LOW (ref >90)
Glucose: 197 mg/dl — ABNORMAL HIGH (ref 70–140)
Potassium: 3.5 mEq/L (ref 3.5–5.1)
Sodium: 134 mEq/L — ABNORMAL LOW (ref 136–145)
Total Bilirubin: 1.9 mg/dL — ABNORMAL HIGH (ref 0.20–1.20)
Total Protein: 5.3 g/dL — ABNORMAL LOW (ref 6.4–8.3)

## 2016-03-21 MED ORDER — ONDANSETRON HCL 4 MG PO TABS: 4.0000 mg | ORAL_TABLET | Freq: Three times a day (TID) | ORAL | Status: DC | PRN

## 2016-03-21 MED ORDER — LEVOTHYROXINE SODIUM 112 MCG PO TABS
112.0000 ug | ORAL_TABLET | Freq: Every day | ORAL | Status: DC
  Administered 2016-03-22 – 2016-03-27 (×6): 112 ug via ORAL
  Filled 2016-03-21 (×6): qty 1

## 2016-03-21 MED ORDER — FLUOXETINE HCL 20 MG PO CAPS
20.0000 mg | ORAL_CAPSULE | Freq: Every day | ORAL | Status: DC
  Administered 2016-03-22 – 2016-03-28 (×7): 20 mg via ORAL
  Filled 2016-03-21 (×7): qty 1

## 2016-03-21 MED ORDER — ONDANSETRON HCL 4 MG/2ML IJ SOLN
4.0000 mg | Freq: Three times a day (TID) | INTRAMUSCULAR | Status: DC | PRN
  Administered 2016-03-26 – 2016-03-27 (×2): 4 mg via INTRAVENOUS
  Filled 2016-03-21 (×2): qty 2

## 2016-03-21 MED ORDER — FLUTICASONE FUROATE-VILANTEROL 200-25 MCG/INH IN AEPB
1.0000 | INHALATION_SPRAY | Freq: Every evening | RESPIRATORY_TRACT | Status: DC
  Administered 2016-03-21 – 2016-03-27 (×7): 1 via RESPIRATORY_TRACT
  Filled 2016-03-21: qty 28

## 2016-03-21 MED ORDER — DEXTROSE 5 % IV SOLN
2.0000 g | Freq: Once | INTRAVENOUS | Status: AC
  Administered 2016-03-21: 2 g via INTRAVENOUS
  Filled 2016-03-21: qty 2

## 2016-03-21 MED ORDER — ALBUTEROL SULFATE (2.5 MG/3ML) 0.083% IN NEBU: 3.0000 mL | INHALATION_SOLUTION | Freq: Four times a day (QID) | RESPIRATORY_TRACT | Status: DC | PRN

## 2016-03-21 MED ORDER — SENNOSIDES-DOCUSATE SODIUM 8.6-50 MG PO TABS
1.0000 | ORAL_TABLET | Freq: Every evening | ORAL | Status: DC | PRN
  Filled 2016-03-21 (×2): qty 1

## 2016-03-21 MED ORDER — SODIUM CHLORIDE 0.9 % IV SOLN
8.0000 mg | Freq: Three times a day (TID) | INTRAVENOUS | Status: DC | PRN
  Filled 2016-03-21: qty 4

## 2016-03-21 MED ORDER — DEXTROSE 5 % IV SOLN
2.0000 g | Freq: Three times a day (TID) | INTRAVENOUS | Status: DC
  Administered 2016-03-21 – 2016-03-25 (×11): 2 g via INTRAVENOUS
  Filled 2016-03-21 (×12): qty 2

## 2016-03-21 MED ORDER — FLUTICASONE PROPIONATE 50 MCG/ACT NA SUSP
1.0000 | Freq: Every day | NASAL | Status: DC
  Administered 2016-03-22 (×2): 1 via NASAL
  Filled 2016-03-21: qty 16

## 2016-03-21 MED ORDER — VANCOMYCIN HCL 10 G IV SOLR
1500.0000 mg | Freq: Once | INTRAVENOUS | Status: AC
  Administered 2016-03-21: 1500 mg via INTRAVENOUS
  Filled 2016-03-21: qty 1500

## 2016-03-21 MED ORDER — HYDROMORPHONE HCL 2 MG/ML IJ SOLN
0.5000 mg | INTRAMUSCULAR | Status: DC | PRN
  Administered 2016-03-22 – 2016-03-27 (×9): 0.5 mg via INTRAVENOUS
  Filled 2016-03-21 (×9): qty 1

## 2016-03-21 MED ORDER — ACETAMINOPHEN 325 MG PO TABS: 650.0000 mg | ORAL_TABLET | Freq: Three times a day (TID) | ORAL | Status: DC | PRN

## 2016-03-21 MED ORDER — LIDOCAINE-PRILOCAINE 2.5-2.5 % EX CREA: 1.0000 "application " | TOPICAL_CREAM | CUTANEOUS | Status: DC | PRN

## 2016-03-21 MED ORDER — ONDANSETRON 4 MG PO TBDP
4.0000 mg | ORAL_TABLET | Freq: Three times a day (TID) | ORAL | Status: DC | PRN
  Administered 2016-03-27 – 2016-03-28 (×2): 4 mg via ORAL
  Filled 2016-03-21 (×2): qty 1

## 2016-03-21 MED ORDER — ACYCLOVIR 400 MG PO TABS
400.0000 mg | ORAL_TABLET | Freq: Every day | ORAL | Status: DC
  Administered 2016-03-22 – 2016-03-28 (×7): 400 mg via ORAL
  Filled 2016-03-21 (×7): qty 1

## 2016-03-21 MED ORDER — SODIUM CHLORIDE 0.9 % IV SOLN
INTRAVENOUS | Status: DC
  Administered 2016-03-21: 16:00:00 via INTRAVENOUS

## 2016-03-21 MED ORDER — SODIUM CHLORIDE 0.9 % IJ SOLN
10.0000 mL | INTRAMUSCULAR | Status: DC | PRN
  Administered 2016-03-21: 10 mL via INTRAVENOUS
  Filled 2016-03-21: qty 10

## 2016-03-21 MED ORDER — MIRTAZAPINE 15 MG PO TABS
15.0000 mg | ORAL_TABLET | Freq: Every day | ORAL | Status: DC
  Administered 2016-03-21 – 2016-03-27 (×7): 15 mg via ORAL
  Filled 2016-03-21 (×7): qty 1

## 2016-03-21 MED ORDER — VANCOMYCIN HCL 10 G IV SOLR
1500.0000 mg | Freq: Once | INTRAVENOUS | Status: DC
  Filled 2016-03-21: qty 1500

## 2016-03-21 MED ORDER — VITAMIN D3 25 MCG (1000 UNIT) PO TABS
1000.0000 [IU] | ORAL_TABLET | Freq: Every day | ORAL | Status: DC
  Administered 2016-03-22 – 2016-03-27 (×6): 1000 [IU] via ORAL
  Filled 2016-03-21 (×6): qty 1

## 2016-03-21 MED ORDER — SODIUM CHLORIDE 0.9 % IV SOLN
1250.0000 mg | Freq: Two times a day (BID) | INTRAVENOUS | Status: DC
  Administered 2016-03-22 – 2016-03-23 (×3): 1250 mg via INTRAVENOUS
  Filled 2016-03-21 (×5): qty 1250

## 2016-03-21 MED ORDER — DOCUSATE SODIUM 100 MG PO CAPS
100.0000 mg | ORAL_CAPSULE | Freq: Two times a day (BID) | ORAL | Status: DC
  Administered 2016-03-21 – 2016-03-27 (×12): 100 mg via ORAL
  Filled 2016-03-21 (×12): qty 1

## 2016-03-21 MED ORDER — ESTRADIOL 1 MG PO TABS
1.0000 mg | ORAL_TABLET | Freq: Every day | ORAL | Status: DC
  Administered 2016-03-22 – 2016-03-27 (×6): 1 mg via ORAL
  Filled 2016-03-21 (×6): qty 1

## 2016-03-21 MED ORDER — TBO-FILGRASTIM 300 MCG/0.5ML ~~LOC~~ SOSY
300.0000 ug | PREFILLED_SYRINGE | Freq: Every day | SUBCUTANEOUS | Status: DC
  Administered 2016-03-21 – 2016-03-25 (×5): 300 ug via SUBCUTANEOUS
  Filled 2016-03-21 (×5): qty 0.5

## 2016-03-21 MED ORDER — ACETAMINOPHEN 325 MG PO TABS
650.0000 mg | ORAL_TABLET | ORAL | Status: DC | PRN
  Administered 2016-03-22 – 2016-03-25 (×2): 650 mg via ORAL
  Filled 2016-03-21 (×2): qty 2

## 2016-03-21 NOTE — H&P (Signed)
[facility] ADMISSION NOTE  Patient Care Team: Abner Greenspan, MD as PCP - General Mosetta Anis, MD (Allergy) Fanny Skates, MD as Consulting Physician (General Surgery) Grace Isaac, MD as Consulting Physician (Cardiothoracic Surgery) Troy Sine, MD as Consulting Physician (Cardiology) Lonn Georgia, PA-C as Physician Assistant (Cardiology) [name], MD as Consulting Physician (Hematology and Oncology) Estill Cotta, MD as Consulting Physician (Ophthalmology) Melida Quitter, MD as Consulting Physician (Otolaryngology) Virgel Manifold as [place], RN as [facility]  CHIEF COMPLAINTS/PURPOSE OF ADMISSION Neutropenic fever, pancytopenia and weakness  HISTORY OF PRESENTING ILLNESS:  Diamond Boyd 76 y.o. female is admitted for management of neutropenic fever Summary of oncologic history as follows: Oncology History   History of colon cancer   Staging form: Colon and Rectum, AJCC 7th Edition     Clinical: Stage IIA (T3, N0, M0) - Signed by [name], MD on 02/21/2014 History of hodgkin's lymphoma   Staging form: Lymphoid Neoplasms, AJCC 6th Edition     Clinical: Stage IV - Signed by [name], MD on 02/21/2014       Hodgkin's lymphoma in relapse ([place])   05/20/2013 Initial Diagnosis    Hodgkin lymphoma      06/27/2014 Imaging    Interval increase in metabolic activity of several small lymph nodes is concerning for lymphoma recurrence. Lymph nodes include a small right level 2 lymph node, right hilar lymph node, and right paratracheal lymph node      07/07/2014 Pathology Results     limited tissue from ultrasound-guided biopsy was nondiagnostic      07/07/2014 Procedure     ultrasound-guided biopsy was nondiagnostic      09/27/2014 Imaging    Repeat PET CT scan show diffuse disease concern for relapse.      10/17/2014 Pathology Results    Accession: [id number]  Biopsy was nondiagnostic      10/17/2014 Procedure    She underwent bronchoscopy & EBUS & biopsy of  LN at Level 10L      11/01/2014 Surgery    She underwent cronchoscopy with endobronchial ultrasound, mediastinoscopy and biopsy       11/01/2014 Pathology Results    Accession: [id number] biopsy showed no evidence of lymphoma, only granulomas.      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 ([place])      12/13/2014 Imaging    PET scan showed diffuse disease      12/28/2014 - 12/30/2014 Hospital Admission    She was admitted to the hospital for cycle one of R-ICE      01/18/2015 - 01/20/2015 Chemotherapy    She was admitted to the hospital for cycle 2 of R-ICE      01/20/2015 - 01/22/2015 Hospital Admission    She was then  admitted to [facility] with respiratory failure/fluid overload and was noted to have mild cardiomyopathy      01/23/2015 - 01/27/2015 Hospital Admission    she was admitted to the hospital with new onset atrial fibrillation with rapid ventricular response and was found to have evidence of myocardial ischemia. She was discharged on medical management and oral anticoagulation therapy      01/25/2015 Procedure    Left heart cath showed non-obstructive lesions: Prox RCA lesion, 40% stenosed.   Prox LAD to Mid LAD lesion, 30% stenosed. Ost LM lesion, 25% stenosed      01/30/2015 Imaging    PET Ct scan showed  near complete resolution of all disease      03/12/2015 - 09/10/2015 Chemotherapy    She received 3 doses of Rituximab every other month      07/10/2015 PET scan    PET showed Improved metabolic activity in the bony lesions with mild persistent hilar LN with increased SUV uptake      09/06/2015 Imaging    Ct scan showed interval development of mild mucosal thickening involving left maxillary and bilateral ethmoid sinuses consistent with mild sinusitis.      12/10/2015 - 12/16/2015 Hospital Admission    She was admitted to the hospital due to diarrhea and colitis and was found to have Clostridium difficile      01/06/2016 - 01/13/2016 Hospital Admission    She had recurrent hospitalization for weakness, pancytopenia and persistent C Difficile infection      01/24/2016 PET scan    No evidence of hypermetabolic soft tissue lymphoma. 2. Redevelopment of diffuse marrow hypermetabolism which could be due to marrow stimulation by chemotherapy. This makes evaluation and delineation of the previously described focal osseous lesions difficult. A right-sided sternal lesion is not significantly changed. 3. Diffuse thyroid hypermetabolism persists and may represent thyroiditis. 4. Cardiomegaly. Coronary artery atherosclerosis. Aortic atherosclerosis. 5. Possible constipation. 6. New left-sided sinus disease.      02/14/2016 - 02/22/2016 Hospital Admission    She was admitted for management of severe nose bleeds      02/21/2016 Surgery    She underwent surgery for :Bilateral maxillary antrostomy. 2. Bilateral anterior ethmoidectomy. 3. Bilateral frontal recess exploration      03/13/2016 Bone Marrow Biopsy    Bone Marrow, Aspirate,Biopsy, and Clot, right iliac - HYPERCELLULAR MARROW WITH PROMINENT INVOLVEMENT BY CLASSICAL HODGKIN LYMPHOMA. - SEE COMMENT. PERIPHERAL BLOOD: - PANCYTOPENIA. Diagnosis Note The aspirate material is limited for cytomorphologic evaluation. However, the core biopsy is  optimal and shows a hypercellular marrow with prominent involvement by numerous variably sized and ill-defined atypical lymphohistiocytic aggregates with morphologic and phenotypic features consistent with previously known classical Hodgkin lymphoma. There is no involvement by non-Hodgkin's B-cell lymphoma in this material. Correlation with cytogenetic and FISH studies is recommended. (BNS:ecj 2015-03-23)      The patient was just discharged from the hospital 3 days ago. This morning, her husband stated she have temperature 103. Over the last 3 days, she has profound weakness, poor appetite, intermittent hemorrhoidal bleeding and recent passage of dark urine. She has occasional cough but nonproductive. Denies abnormal skin rashes. She denies sore throat, dysuria, frequency or urgency. She denies recent constipation. In my office, she was noted to have pancytopenia. She is also noted to be probably hypotensive and tachycardic. She is being admitted for management of neutropenic fever  MEDICAL HISTORY:  Past Medical History:  Diagnosis Date  . Anemia    hx blood tx  .  Asthma   . Benign essential HTN   . Cancer ([place])    skin cancer- basal cell on arm / colon 2011  . Chronic systolic CHF (congestive heart failure), NYHA class 2 ([place]) 01/2015  . Colon cancer ([place])    2011, s/p surgery, in remission  . Colon polyps   . Degenerative disk disease    Thoracic spine  . Depression   . 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 Generalized weakness 05/11/2015  . GERD (gastroesophageal reflux disease)   . Heart murmur   . hodgkins lymphoma   . Hypothyroidism   . Lymphoma ([place])   . Neuropathy due to chemotherapeutic drug ([place]) 06/19/2014  . NICM (nonischemic cardiomyopathy) ([place]) 01/2015  . Osteoarthritis    hands  . 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 PAF (paroxysmal atrial fibrillation) ([place])   .  Peripheral vascular disease ([place])   . Pneumonia     SURGICAL HISTORY: Past Surgical History:  Procedure Laterality Date  . BONE MARROW BIOPSY  05/27/13  . BREAST BIOPSY  1996  . CARDIAC CATHETERIZATION N/A 01/25/2015   Procedure: Left Heart Cath and Coronary Angiography;  Surgeon: Peter M Martinique, MD;  Location: [facility];  Service: Cardiovascular;  Laterality: N/A;  . CATARACT EXTRACTION, BILATERAL  2016  . CHOLECYSTECTOMY    . COLECTOMY  8/11  . COLONOSCOPY W/ BIOPSIES    . DG BIOPSY LUNG    . LYMPH NODE BIOPSY N/A 11/01/2014   Procedure: MEDIASTINAL LYMPH NODE BIOPSY;  Surgeon: Grace Isaac, MD;  Location: [facility];  Service: Thoracic;  Laterality: N/A;  . MEDIASTINOSCOPY N/A 11/01/2014   Procedure: MEDIASTINOSCOPY;  Surgeon: Grace Isaac, MD;  Location: [facility];  Service: Thoracic;  Laterality: N/A;  . PLANTAR FASCIA RELEASE    . port-a-cath insertion    . SINUS ENDO W/FUSION Bilateral 02/20/2016   Procedure: BILATERAL MAXILLARY ANTROSTOMY, BILATERAL ANTERIOR ETHMOIDECTOMY, BILATERAL FRONTAL RECESS EXPLORATION, FUSION IMAGE GUIDANCE;  Surgeon: Melida Quitter, MD;  Location: [facility];  Service: ENT;  Laterality: Bilateral;  . SPLENECTOMY     lymphoma  . TENDON RELEASE     Right thumb   . VAGINAL HYSTERECTOMY    . VENTRAL HERNIA REPAIR  1998  . VIDEO BRONCHOSCOPY WITH ENDOBRONCHIAL ULTRASOUND N/A 10/17/2014   Procedure: VIDEO BRONCHOSCOPY WITH ENDOBRONCHIAL ULTRASOUND;  Surgeon: Javier Glazier, MD;  Location: [facility];  Service: Thoracic;  Laterality: N/A;  . VIDEO BRONCHOSCOPY WITH ENDOBRONCHIAL ULTRASOUND N/A 11/01/2014   Procedure: VIDEO BRONCHOSCOPY WITH ENDOBRONCHIAL ULTRASOUND;  Surgeon: Grace Isaac, MD;  Location: [facility];  Service: Thoracic;  Laterality: N/A;    SOCIAL HISTORY: Social History   Social History  . Marital status: Married    Spouse name: N/A  . Number of children: 2  . Years of education: N/A   Occupational History  . Retired  Retired    Social History Main Topics  . Smoking status: Never Smoker  . Smokeless tobacco: Never Used     Comment: Second-hand exposure through father.  . Alcohol use No  . Drug use: No  . Sexual activity: No   Other Topics Concern  . Not on file   Social History Narrative   Originally from [place]. Always lived in [place]. Previously has traveled to [facility], [place] & up [employer] to [place]. No international travel. No pets currently. Remote parakeet exposure with her children. 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    Lives at home with husband. Weakness due to chemo, but otherwise independant       FAMILY HISTORY: Family History  Problem Relation Age of Onset  . Coronary artery disease Mother   . Diabetes Mother   . Fibromyalgia Daughter     chronic pain   . COPD Daughter   . Diabetes Brother   . Asthma Daughter   . Rheum arthritis Brother   . Clotting disorder Brother   . Alcohol abuse Sister   . Colon cancer Neg Hx   . Colon polyps Neg Hx   . Stomach cancer Neg Hx   . Rectal cancer Neg Hx   . Ulcerative colitis Neg Hx   . Crohn's disease Neg Hx     ALLERGIES:  is allergic to achromycin [tetracycline hcl]; acyclovir and related; allopurinol; astelin [azelastine hcl]; cephalexin; codeine; lisinopril; meloxicam; minocycline; nabumetone; nyquil [pseudoeph-doxylamine-dm-apap]; sulfa antibiotics; zolpidem tartrate; buspar [buspirone hcl]; and ciprofloxacin.  MEDICATIONS:  Current Facility-Administered Medications  Medication Dose Route Frequency Provider Last Rate Last Dose  . 0.9 %  sodium chloride infusion   Intravenous Continuous [name], MD 100 mL/hr at 03/21/16 1558    . acetaminophen (TYLENOL) tablet 650 mg  650 mg Oral Q4H PRN [name], MD      . Derrill Memo ON 03/22/2016] acyclovir (ZOVIRAX) tablet 400 mg  400 mg Oral Daily Sabiha Sura, MD      . albuterol (PROVENTIL) (2.5 MG/3ML) 0.083% nebulizer solution 3 mL  3 mL Inhalation Q6H PRN [name], MD      . Derrill Memo  ON 03/22/2016] cholecalciferol (VITAMIN D) tablet 1,000 Units  1,000 Units Oral Daily [name], MD      . Derrill Memo ON 03/22/2016] estradiol (ESTRACE) tablet 1 mg  1 mg Oral Daily [name], MD      . Derrill Memo ON 03/22/2016] FLUoxetine (PROZAC) capsule 20 mg  20 mg Oral Daily Lurdes Haltiwanger, MD      . Derrill Memo ON 03/22/2016] fluticasone (FLONASE) 50 MCG/ACT nasal spray 1 spray  1 spray Each Nare Daily Samaira Holzworth, MD      . fluticasone furoate-vilanterol (BREO ELLIPTA) 200-25 MCG/INH 1 puff  1 puff Inhalation QPM [name], MD   1 puff at 03/21/16 1634  . HYDROmorphone (DILAUDID) injection 0.5 mg  0.5 mg Intravenous Q2H PRN [name], MD      . Derrill Memo ON 03/22/2016] levothyroxine (SYNTHROID, LEVOTHROID) tablet 112 mcg  112 mcg Oral QAC breakfast [name], MD      . lidocaine-prilocaine (EMLA) cream 1 application  1 application Topical PRN Teneka Malmberg, MD      . ondansetron (ZOFRAN) tablet 4-8 mg  4-8 mg Oral Q8H PRN [name], MD       Or  . ondansetron (ZOFRAN-ODT) disintegrating tablet 4-8 mg  4-8 mg Oral Q8H PRN [name], MD       Or  . ondansetron (ZOFRAN) injection 4 mg  4 mg Intravenous Q8H PRN [name], MD       Or  . ondansetron (ZOFRAN) 8 mg in sodium chloride 0.9 % 50 mL IVPB  8 mg Intravenous Q8H PRN Jelisha Weed, MD      . senna-docusate (Senokot-S) tablet 1 tablet  1 tablet Oral QHS PRN [name], MD      . vancomycin (VANCOCIN) 1,500 mg in sodium chloride 0.9 % 500 mL IVPB  1,500 mg Intravenous Once Dara Hoyer, RPH   1,500  mg at 03/21/16 1603   Facility-Administered Medications Ordered in Other Encounters  Medication Dose Route Frequency Provider Last Rate Last Dose  . sodium chloride 0.9 % injection 10 mL  10 mL Intravenous PRN [name], MD   10 mL at 12/20/15 1436    REVIEW OF SYSTEMS:   Eyes: Denies blurriness of vision, double vision or watery eyes Ears, nose, mouth, throat, and face: Denies mucositis or sore throat Cardiovascular: Denies palpitation, chest discomfort or  lower extremity swelling Gastrointestinal:  Denies nausea, heartburn or change in bowel habits Skin: Denies abnormal skin rashes Lymphatics: Denies new lymphadenopathy or easy bruising Behavioral/Psych: Mood is stable, no new changes  All other systems were reviewed with the patient and are negative.  PHYSICAL EXAMINATION: ECOG PERFORMANCE STATUS: 2 - Symptomatic, <50% confined to bed  Vitals:   03/21/16 1456  BP: (!) 114/57  Pulse: 95  Resp: 16  Temp: 98.2 F (36.8 C)   Filed Weights   03/21/16 1456  Weight: 216 lb (98 kg)    GENERAL:alert, no distress and comfortable. She appears weak SKIN: skin color, texture, turgor are normal, no rashes or significant lesions. Extensive bruises are noted EYES: normal, conjunctiva are pink and non-injected, sclera clear OROPHARYNX:no exudate, no erythema and lips, buccal mucosa, and tongue normal  NECK: supple, thyroid normal size, non-tender, without nodularity LYMPH:  no palpable lymphadenopathy in the cervical, axillary or inguinal LUNGS: clear to auscultation and percussion with normal breathing effort HEART: regular rate & rhythm and no murmurs with moderate bilateral lower extremity edema ABDOMEN:abdomen soft, non-tender and normal bowel sounds Musculoskeletal:no cyanosis of digits and no clubbing  PSYCH: alert & oriented x 3 with fluent speech NEURO: no focal motor/sensory deficits  LABORATORY DATA:  I have reviewed the data as listed Lab Results  Component Value Date   WBC 1.5 (L) 03/21/2016   HGB 8.6 (L) 03/21/2016   HCT 26.3 (L) 03/21/2016   MCV 94.3 03/21/2016   PLT 29 (L) 03/21/2016    Recent Labs  12/12/15 0345  03/16/16 0530 03/17/16 0442 03/18/16 0550 03/21/16 1213  NA 135  < > 138 139 137 134*  K 3.7  < > 3.6 3.1* 3.6 3.5  CL 104  < > 99* 101 99*  --   CO2 23  < > 31 31 27 25   GLUCOSE 86  < > 148* 161* 162* 197*  BUN 15  < > 29* 25* 17 8.6  CREATININE 0.82  < > 0.88 0.92 1.08* 0.8  CALCIUM 8.5*  < >  8.7* 8.4* 8.5* 8.8  GFRNONAA >60  < > >60 59* 49*  --   GFRAA >60  < > >60 >60 56*  --   PROT 5.2*  < > 5.7* 5.3* 5.9* 5.3*  ALBUMIN 2.6*  < > 2.6* 2.2* 2.7* 2.4*  AST 68*  < > 56* 45* 44* 30  ALT 28  < > 55* 45 41 26  ALKPHOS 205*  < > 285* 264* 287* 345*  BILITOT 1.2  < > 2.2* 1.8* 2.2* 1.90*  BILIDIR 0.3  --   --   --   --   --   IBILI 0.9  --   --   --   --   --   < > = values in this interval not displayed.  RADIOGRAPHIC STUDIES: I have personally reviewed the radiological images as listed and agreed with the findings in the report. Dg Chest 2 View  Result Date: 03/21/2016 CLINICAL  DATA:  Weakness in a patient with Hodgkin's lymphoma. EXAM: CHEST  2 VIEW COMPARISON:  PA and lateral chest 02/14/2016 and 01/22/2015. CT chest 01/21/2015 FINDINGS: Port-A-Cath is in place. Lungs are clear. Heart size is normal. No pneumothorax or pleural effusion. Aortic atherosclerosis is noted. Surgical clips left upper quadrant are seen. IMPRESSION: No acute disease. Atherosclerosis. Electronically Signed   By: Inge Rise M.D.   On: 03/21/2016 15:16   Ct Abdomen Pelvis W Contrast  Result Date: 03/09/2016 CLINICAL DATA:  Patient with progressive elevated liver enzymes and in leg swelling. EXAM: CT ABDOMEN AND PELVIS WITH CONTRAST TECHNIQUE: Multidetector CT imaging of the abdomen and pelvis was performed using the standard protocol following bolus administration of intravenous contrast. CONTRAST:  45m ISOVUE-300 IOPAMIDOL (ISOVUE-300) INJECTION 61%, 1032mISOVUE-300 IOPAMIDOL (ISOVUE-300) INJECTION 61% COMPARISON:  PET-CT 01/17/2016; CT abdomen pelvis 12/11/2015. FINDINGS: Lower chest: Normal heart size. No pericardial effusion. Dependent atelectasis within the bilateral lower lobes. No pleural effusion. Hepatobiliary: Liver is normal in size and contour. No focal hepatic lesion is identified. Patient status post cholecystectomy. Pancreas: Unremarkable Spleen: Surgically absent. Adrenals/Urinary Tract:  Adrenal glands are normal. Kidneys enhance symmetrically with contrast. No hydronephrosis. Urinary bladder is unremarkable. Stomach/Bowel: Normal morphology of the stomach. There is suggestion of mild wall thickening of the duodenum and small bowel. No evidence for bowel obstruction. Postsurgical changes status post partial colonic resection. Wall thickening of the colon at the level of the surgical anastomosis (image 20; series 5). Vascular/Lymphatic: Normal caliber abdominal aorta. Peripheral calcified atherosclerotic plaque. No enlarged retroperitoneal or pelvic lymphadenopathy. Reproductive: Uterus surgically absent. Other: Interval development of small amount of free fluid within the pelvis and small bowel mesentery. Musculoskeletal: Probable hemangioma L2 vertebral body. Unchanged tiny lucencies L3 and L4 are nonspecific. Tumor involvement is not excluded. Lower lumbar spine degenerative changes. Re- demonstrated anterior abdominal wall laxity. IMPRESSION: Interval development of wall thickening of the duodenum and small bowel throughout the abdomen with associated mesenteric free fluid and fat stranding. Findings are concerning for the possibility of enteritis. No lymphadenopathy identified within the abdomen or pelvis. Re- demonstrated wall thickening of the colon at the level of surgical anastomosis, potentially postprocedural in etiology. Recommend attention on follow-up as underlying mass is not entirely excluded. Electronically Signed   By: DrLovey Newcomer.D.   On: 03/09/2016 10:22   Ct Biopsy  Result Date: 03/10/2016 CLINICAL DATA:  History of lymphoma and ITP. Bone marrow biopsy required to assess for possible lymphoma recurrence versus other bone marrow process. EXAM: CT GUIDED BONE MARROW ASPIRATION AND BIOPSY ANESTHESIA/SEDATION: Versed 1.0 mg IV, Fentanyl 50 mcg IV Total Moderate Sedation Time:  14 minutes. The patient's level of consciousness and physiologic status were continuously monitored  during the procedure by Radiology nursing. PROCEDURE: The procedure risks, benefits, and alternatives were explained to the patient. Questions regarding the procedure were encouraged and answered. The patient understands and consents to the procedure. A time out was performed prior to initiating the procedure. The right gluteal region was prepped with chlorhexidine. Sterile gown and sterile gloves were used for the procedure. Local anesthesia was provided with 1% Lidocaine. Under CT guidance, an 11 gauge On Control bone cutting needle was advanced from a posterior approach into the right iliac bone. Needle positioning was confirmed with CT. Initial non heparinized and heparinized aspirate samples were obtained of bone marrow. Core biopsy was performed via the On Control drill needle. COMPLICATIONS: None FINDINGS: Inspection of initial aspirate did reveal visible particles. Intact core biopsy  sample was obtained. IMPRESSION: CT guided bone marrow biopsy of right posterior iliac bone with both aspirate and core samples obtained. Electronically Signed   By: Aletta Edouard M.D.   On: 03/10/2016 13:04   Ct Bone Marrow Biopsy & Aspiration  Result Date: 03/10/2016 CLINICAL DATA:  History of lymphoma and ITP. Bone marrow biopsy required to assess for possible lymphoma recurrence versus other bone marrow process. EXAM: CT GUIDED BONE MARROW ASPIRATION AND BIOPSY ANESTHESIA/SEDATION: Versed 1.0 mg IV, Fentanyl 50 mcg IV Total Moderate Sedation Time:  14 minutes. The patient's level of consciousness and physiologic status were continuously monitored during the procedure by Radiology nursing. PROCEDURE: The procedure risks, benefits, and alternatives were explained to the patient. Questions regarding the procedure were encouraged and answered. The patient understands and consents to the procedure. A time out was performed prior to initiating the procedure. The right gluteal region was prepped with chlorhexidine. Sterile  gown and sterile gloves were used for the procedure. Local anesthesia was provided with 1% Lidocaine. Under CT guidance, an 11 gauge On Control bone cutting needle was advanced from a posterior approach into the right iliac bone. Needle positioning was confirmed with CT. Initial non heparinized and heparinized aspirate samples were obtained of bone marrow. Core biopsy was performed via the On Control drill needle. COMPLICATIONS: None FINDINGS: Inspection of initial aspirate did reveal visible particles. Intact core biopsy sample was obtained. IMPRESSION: CT guided bone marrow biopsy of right posterior iliac bone with both aspirate and core samples obtained. Electronically Signed   By: Aletta Edouard M.D.   On: 03/10/2016 13:04    ASSESSMENT & PLAN:   Neutropenic fever She presented with neutropenic fever, pancytopenia, hypotensive and tachycardia. Due to history of congestive heart failure, I will hold excessive fluid hydration. I will consult pharmacist for antimicrobial coverage I will start her on daily G-CSF  Recurrent classical Hodgkin lymphoma Recent PET CT scan showedhyper intense metabolic activity in the bone marrow area  Bone marrow biopsy from 03/10/16 is reviewed with the pathologist which show greater than 50% bone marrow involvement from Hodgkin lymphoma She has received cycle 1 of chemotherapy on 03/13/2016 Continue supportive care  Pancytopenia She has received 2 units of blood transfusion on 03/08/2016 No further transfusion unless hemoglobin less than 8 Continue to monitor platelet count carefully and transfuse if less than 15,000 With her platelet count trending down, she will likely need platelet transfusion tomorrow  Exarcebation of congestive heart failure with cough and orthopnea Due to signs of hypotension and tachycardia, will hold her diuretic therapy and blood pressure medications Monitor in & out and daily weight  Elevated liver enzymes and leg  swelling CT scan of the abdomen did not reveal the cause of abnormal liver enzymes. Incidentally, there is some mesenteric edema Could be due to volume overload and CHF We'll add TED hose for leg swelling  Poor oral intake, moderate protein calorie malnutrition This is likely due to cancer relapse Started[place]meron We'll consult dietitian  Significant weakness We'll consult physical therapy next week  Constipation Add colace  DVT prophylaxis Lovenox on hold due to low platelet count  CODE STATUS Full code  Goals of care discussion The patient is aware shehas incurabledisease and treatment is strictly palliative. We discussed importance of Advanced Directives and Living will. I will get assistance from our social worker to help herfill out some paperwork. We discussed CODE STATUS; the patient desires to remain in full code. She appointed her husband as Loss adjuster, chartered power  of attorney With her progressive decline and recurrent admission to the hospital, I will readdress goals of care again tomorrow  Discharge planning Not ready, probably next week  All questions were answered. The patient knows to call the clinic with any problems, questions or concerns.    [name], MD 03/21/2016 5:21 PM

## 2016-03-21 NOTE — Progress Notes (Signed)
This encounter was created in error - please disregard.

## 2016-03-21 NOTE — Progress Notes (Addendum)
Pharmacy Antibiotic Note  Diamond Boyd is a 76 y.o. female admitted on 03/21/2016 with Febrile Neutropenia.  Pharmacy has been consulted for Vancomycin and Cefepime dosing.  Plan:  Vancomycin 1500 mg IV now, then 1250 mg IV q12 hr; goal trough 15-20 mcg/mL  Measure vancomycin trough levels at steady state as indicated  Cefepime 2 g IV q8 hr (FN dosing)  SCr at least q48 hr while on vancomycin  Height: '5\' 4"'$  (162.6 cm) Weight: 216 lb (98 kg) IBW/kg (Calculated) : 54.7  Temp (24hrs), Avg:98.2 F (36.8 C), Min:98.2 F (36.8 C), Max:98.2 F (36.8 C)   Recent Labs Lab 03/15/16 0500 03/16/16 0530 03/17/16 0442 03/18/16 0550 03/21/16 1213  WBC 3.5* 3.1* 7.7 5.4 1.5*  CREATININE  --  0.88 0.92 1.08* 0.8    Estimated Creatinine Clearance: 68 mL/min (by C-G formula based on SCr of 0.8 mg/dL).    Allergies  Allergen Reactions  . Achromycin [Tetracycline Hcl] Other (See Comments)    UNSPECIFIED REACTION  Pt does not remember reaction  . Acyclovir And Related   . Allopurinol Other (See Comments)    REACTION: Unsure of reaction happene years ago  . Astelin [Azelastine Hcl] Other (See Comments)    Reaction unknown  . Cephalexin Other (See Comments)    REACTION: unsure of reaction happened yrs ago.  . Codeine Other (See Comments)    REACTION: abd. pain  . Lisinopril Cough  . Meloxicam Other (See Comments)    REACTION: GI symptoms  . Minocycline Other (See Comments)    Abdominal pain  . Nabumetone Other (See Comments)    REACTION: reaction not known  . Nyquil [Pseudoeph-Doxylamine-Dm-Apap] Hives  . Sulfa Antibiotics Other (See Comments)    Gi side eff   . Zolpidem Tartrate Other (See Comments)    REACTION: feels too drugged  . Buspar [Buspirone Hcl] Other (See Comments)    Dizziness, and not as effective for anxiety  . Ciprofloxacin Rash    Antimicrobials this admission: Vancomycin 2/9 >>  Cefepime 2/9 >>   Dose adjustments this admission: ---  Microbiology  results: none  Thank you for allowing pharmacy to be a part of this patient's care.  Reuel Boom, PharmD, BCPS Pager: [phone] 03/21/2016, 7:43 PM

## 2016-03-22 ENCOUNTER — Encounter (HOSPITAL_COMMUNITY): Payer: Self-pay | Admitting: *Deleted

## 2016-03-22 DIAGNOSIS — K649 Unspecified hemorrhoids: Secondary | ICD-10-CM

## 2016-03-22 DIAGNOSIS — K1231 Oral mucositis (ulcerative) due to antineoplastic therapy: Secondary | ICD-10-CM | POA: Insufficient documentation

## 2016-03-22 LAB — CBC WITH DIFFERENTIAL/PLATELET
BASOS ABS: 0 10*3/uL (ref 0.0–0.1)
Basophils Relative: 1 %
Eosinophils Absolute: 0 10*3/uL (ref 0.0–0.7)
Eosinophils Relative: 0 %
HCT: 21.5 % — ABNORMAL LOW (ref 36.0–46.0)
Hemoglobin: 7.1 g/dL — ABNORMAL LOW (ref 12.0–15.0)
LYMPHS ABS: 1.1 10*3/uL (ref 0.7–4.0)
Lymphocytes Relative: 55 %
MCH: 30.6 pg (ref 26.0–34.0)
MCHC: 33 g/dL (ref 30.0–36.0)
MCV: 92.7 fL (ref 78.0–100.0)
MONO ABS: 0.2 10*3/uL (ref 0.1–1.0)
MONOS PCT: 10 %
Neutro Abs: 0.7 10*3/uL — ABNORMAL LOW (ref 1.7–7.7)
Neutrophils Relative %: 34 %
PLATELETS: 21 10*3/uL — AB (ref 150–400)
RBC: 2.32 MIL/uL — AB (ref 3.87–5.11)
RDW: 20.9 % — ABNORMAL HIGH (ref 11.5–15.5)
WBC: 2 10*3/uL — AB (ref 4.0–10.5)

## 2016-03-22 LAB — COMPREHENSIVE METABOLIC PANEL
ALT: 22 U/L (ref 14–54)
AST: 25 U/L (ref 15–41)
Albumin: 2.2 g/dL — ABNORMAL LOW (ref 3.5–5.0)
Alkaline Phosphatase: 215 U/L — ABNORMAL HIGH (ref 38–126)
Anion gap: 8 (ref 5–15)
BILIRUBIN TOTAL: 1.6 mg/dL — AB (ref 0.3–1.2)
BUN: 9 mg/dL (ref 6–20)
CO2: 24 mmol/L (ref 22–32)
Calcium: 8.5 mg/dL — ABNORMAL LOW (ref 8.9–10.3)
Chloride: 105 mmol/L (ref 101–111)
Creatinine, Ser: 0.72 mg/dL (ref 0.44–1.00)
Glucose, Bld: 219 mg/dL — ABNORMAL HIGH (ref 65–99)
POTASSIUM: 2.8 mmol/L — AB (ref 3.5–5.1)
Sodium: 137 mmol/L (ref 135–145)
TOTAL PROTEIN: 4.6 g/dL — AB (ref 6.5–8.1)

## 2016-03-22 LAB — PREPARE RBC (CROSSMATCH)

## 2016-03-22 LAB — BRAIN NATRIURETIC PEPTIDE: B NATRIURETIC PEPTIDE 5: 415.6 pg/mL — AB (ref 0.0–100.0)

## 2016-03-22 MED ORDER — SODIUM CHLORIDE 0.9 % IV SOLN
Freq: Once | INTRAVENOUS | Status: AC
  Administered 2016-03-22: 14:00:00 via INTRAVENOUS

## 2016-03-22 MED ORDER — ENSURE ENLIVE PO LIQD
237.0000 mL | Freq: Two times a day (BID) | ORAL | Status: DC
  Administered 2016-03-24 – 2016-03-28 (×8): 237 mL via ORAL

## 2016-03-22 MED ORDER — PRAMOXINE HCL 1 % RE FOAM
Freq: Three times a day (TID) | RECTAL | Status: DC | PRN
  Administered 2016-03-25: 10:00:00 via RECTAL
  Filled 2016-03-22 (×2): qty 15

## 2016-03-22 MED ORDER — SODIUM CHLORIDE 0.9 % IV SOLN
30.0000 meq | Freq: Once | INTRAVENOUS | Status: AC
  Administered 2016-03-22: 30 meq via INTRAVENOUS
  Filled 2016-03-22: qty 15

## 2016-03-22 MED ORDER — ACETAMINOPHEN 325 MG PO TABS
650.0000 mg | ORAL_TABLET | Freq: Once | ORAL | Status: AC
  Administered 2016-03-22: 650 mg via ORAL
  Filled 2016-03-22: qty 2

## 2016-03-22 MED ORDER — POTASSIUM CHLORIDE CRYS ER 20 MEQ PO TBCR
20.0000 meq | EXTENDED_RELEASE_TABLET | Freq: Two times a day (BID) | ORAL | Status: AC
  Administered 2016-03-22 – 2016-03-23 (×4): 20 meq via ORAL
  Filled 2016-03-22 (×4): qty 1

## 2016-03-22 MED ORDER — DIPHENHYDRAMINE HCL 25 MG PO CAPS
25.0000 mg | ORAL_CAPSULE | Freq: Once | ORAL | Status: AC
  Administered 2016-03-22: 25 mg via ORAL
  Filled 2016-03-22: qty 1

## 2016-03-22 MED ORDER — FUROSEMIDE 10 MG/ML IJ SOLN
20.0000 mg | Freq: Once | INTRAMUSCULAR | Status: AC
  Administered 2016-03-22: 20 mg via INTRAVENOUS
  Filled 2016-03-22: qty 2

## 2016-03-22 MED ORDER — MAGIC MOUTHWASH W/LIDOCAINE
5.0000 mL | Freq: Four times a day (QID) | ORAL | Status: DC
  Administered 2016-03-22 – 2016-03-28 (×23): 5 mL via ORAL
  Filled 2016-03-22 (×28): qty 5

## 2016-03-22 NOTE — Progress Notes (Signed)
Diamond Boyd   DOB:08/18/1940   ZO#:[id number]    Subjective: She has been afebrile since admission. She complained of mucositis pain. She continues to have mild hemorrhoidal irritation with minor bleeding.  Objective:  Vitals:   03/21/16 2011 03/22/16 0507  BP: (!) 134/56 (!) 113/54  Pulse: 100 (!) 102  Resp: 16 16  Temp: 98 F (36.7 C) 97.9 F (36.6 C)     Intake/Output Summary (Last 24 hours) at 03/22/16 0757 Last data filed at 03/22/16 9811  Gross per 24 hour  Intake          2873.34 ml  Output             1400 ml  Net          1473.34 ml    GENERAL:alert, no distress and comfortable SKIN: She looks her with diffuse bruising EYES: normal, Conjunctiva are pink and non-injected, sclera clear OROPHARYNX:no exudate, no erythema and lips, buccal mucosa, and tongue normal  NECK: supple, thyroid normal size, non-tender, without nodularity LYMPH:  no palpable lymphadenopathy in the cervical, axillary or inguinal LUNGS: clear to auscultation and percussion with normal breathing effort HEART: regular rate & rhythm and no murmurs and no lower extremity edema ABDOMEN:abdomen soft, non-tender and normal bowel sounds Musculoskeletal:no cyanosis of digits and no clubbing  NEURO: alert & oriented x 3 with fluent speech, no focal motor/sensory deficits   Labs:  Lab Results  Component Value Date   WBC 2.0 (L) 03/22/2016   HGB 7.1 (L) 03/22/2016   HCT 21.5 (L) 03/22/2016   MCV 92.7 03/22/2016   PLT 21 (LL) 03/22/2016   NEUTROABS 0.7 (L) 03/22/2016    Lab Results  Component Value Date   NA 137 03/22/2016   K 2.8 (L) 03/22/2016   CL 105 03/22/2016   CO2 24 03/22/2016    Studies:  Dg Chest 2 View  Result Date: 03/21/2016 CLINICAL DATA:  Weakness in a patient with Hodgkin's lymphoma. EXAM: CHEST  2 VIEW COMPARISON:  PA and lateral chest 02/14/2016 and 01/22/2015. CT chest 01/21/2015 FINDINGS: Port-A-Cath is in place. Lungs are clear. Heart size is normal. No pneumothorax or  pleural effusion. Aortic atherosclerosis is noted. Surgical clips left upper quadrant are seen. IMPRESSION: No acute disease. Atherosclerosis. Electronically Signed   By: Inge Rise M.D.   On: 03/21/2016 15:16    Assessment & Plan:   Neutropenic fever She presented with neutropenic fever, pancytopenia, hypotensive and tachycardia. Her blood pressure has improved with IV hydration Due to history of congestive heart failure, I will stop fluid hydration. I appreciate pharmacist for antimicrobial coverage. I will continue daily G-CSF until [place] is greater than 1.5 So far, urinalysis & chest x-ray were negative for source of infection. Blood culture is pending  Recurrent classical Hodgkin lymphoma Recent PET CT scan showedhyper intense metabolic activity in the bone marrow area  Bone marrow biopsy from 03/10/16 is reviewed with the pathologist which show greater than 50% bone marrow involvement from Hodgkin lymphoma She has received cycle 1 of chemotherapy on 03/13/2016 Continue supportive care  Pancytopenia She has received 2 units of blood transfusion on 03/08/2016 She is profoundly anemic again. We discussed some of the risks, benefits, and alternatives of blood transfusions. The patient is symptomatic from anemia and the hemoglobin level is critically low.  Some of the side-effects to be expected including risks of transfusion reactions, chills, infection, syndrome of volume overload and risk of hospitalization from various reasons and the patient is willing  to proceed and went ahead to sign consent today. I plan to give her 2 more units of blood Continue to monitor platelet count carefully and transfuse if less than 15,000 With her platelet count trending down, she will likely need platelet transfusion tomorrow  Exarcebation of congestive heart failure with cough and orthopnea Due to signs of hypotension and tachycardia, will hold her diuretic therapy and blood pressure  medications Monitor in & out and daily weight We'll stop IV fluids  Elevated liver enzymes and leg swelling CT scan of the abdomen did not reveal the cause of abnormal liver enzymes. Incidentally, there is some mesenteric edema Could be due to volume overload and CHF We'll add TED hose for leg swelling  Poor oral intake, moderate protein calorie malnutrition This is likely due to cancer relapse Startedremeron We'll consult dietitian  Significant weakness We'll consult physical therapy next week  Constipation Add colace She has hemorrhoidal bleeding. We'll start her on sitz bath and proctofoam We'll avoid Anusol cream due to neutropenia  Mucositis from chemotherapy I will start her on Magic mouthwash  Severe hypokalemia Likely related to Zosyn and poor oral intake I will replaced both orally and IV  DVT prophylaxis Lovenox on hold due to low platelet count  CODE STATUS Full code  Goals of care discussion The patient is aware shehas incurabledisease and treatment is strictly palliative. We discussed importance of Advanced Directives and Living will. We discussed CODE STATUS; the patient desires to remain in full code. She appointed her husband as medical healthcare power of attorney With her progressive decline and recurrent admission to the hospital, I spoke with the husband again today to consider changing goals of care  Discharge planning Not ready, probably next week  [name], MD 03/22/2016  7:57 AM

## 2016-03-22 NOTE — Progress Notes (Signed)
CRITICAL VALUE ALERT  Critical value received:  Platelet count 21,000  Date of notification:  03/22/2016  Time of notification:  0521  Critical value read back:Yes.    Nurse who received alert:  mk  MD notified (1st page):  N/A similar result already reported  Time of first page:  na  MD notified (2nd page):  Time of second page:  Responding MD:  na  Time MD responded:  na

## 2016-03-23 LAB — TYPE AND SCREEN
ABO/RH(D): A POS
Antibody Screen: NEGATIVE
DONOR AG TYPE: NEGATIVE
DONOR AG TYPE: NEGATIVE
UNIT DIVISION: 0
Unit division: 0

## 2016-03-23 LAB — CBC WITH DIFFERENTIAL/PLATELET
BASOS PCT: 1 %
Basophils Absolute: 0 10*3/uL (ref 0.0–0.1)
EOS ABS: 0 10*3/uL (ref 0.0–0.7)
EOS PCT: 0 %
HEMATOCRIT: 31.7 % — AB (ref 36.0–46.0)
Hemoglobin: 10.7 g/dL — ABNORMAL LOW (ref 12.0–15.0)
Lymphocytes Relative: 51 %
Lymphs Abs: 1.4 10*3/uL (ref 0.7–4.0)
MCH: 31 pg (ref 26.0–34.0)
MCHC: 33.8 g/dL (ref 30.0–36.0)
MCV: 91.9 fL (ref 78.0–100.0)
Monocytes Absolute: 0.2 10*3/uL (ref 0.1–1.0)
Monocytes Relative: 9 %
NEUTROS ABS: 1 10*3/uL — AB (ref 1.7–7.7)
Neutrophils Relative %: 39 %
Platelets: 18 10*3/uL — CL (ref 150–400)
RBC: 3.45 MIL/uL — ABNORMAL LOW (ref 3.87–5.11)
RDW: 19.1 % — ABNORMAL HIGH (ref 11.5–15.5)
WBC: 2.6 10*3/uL — AB (ref 4.0–10.5)

## 2016-03-23 LAB — COMPREHENSIVE METABOLIC PANEL
ALT: 23 U/L (ref 14–54)
AST: 29 U/L (ref 15–41)
Albumin: 2.7 g/dL — ABNORMAL LOW (ref 3.5–5.0)
Alkaline Phosphatase: 244 U/L — ABNORMAL HIGH (ref 38–126)
Anion gap: 9 (ref 5–15)
BILIRUBIN TOTAL: 2 mg/dL — AB (ref 0.3–1.2)
BUN: 7 mg/dL (ref 6–20)
CO2: 22 mmol/L (ref 22–32)
Calcium: 8.7 mg/dL — ABNORMAL LOW (ref 8.9–10.3)
Chloride: 107 mmol/L (ref 101–111)
Creatinine, Ser: 0.81 mg/dL (ref 0.44–1.00)
Glucose, Bld: 293 mg/dL — ABNORMAL HIGH (ref 65–99)
POTASSIUM: 3.2 mmol/L — AB (ref 3.5–5.1)
Sodium: 138 mmol/L (ref 135–145)
TOTAL PROTEIN: 5.4 g/dL — AB (ref 6.5–8.1)

## 2016-03-23 LAB — VANCOMYCIN, TROUGH: Vancomycin Tr: 29 ug/mL (ref 15–20)

## 2016-03-23 MED ORDER — FLUTICASONE PROPIONATE 50 MCG/ACT NA SUSP
1.0000 | Freq: Two times a day (BID) | NASAL | Status: DC
  Administered 2016-03-23 – 2016-03-27 (×9): 1 via NASAL

## 2016-03-23 MED ORDER — VANCOMYCIN HCL 10 G IV SOLR
1500.0000 mg | INTRAVENOUS | Status: DC
  Administered 2016-03-24: 1500 mg via INTRAVENOUS
  Filled 2016-03-23: qty 1500

## 2016-03-23 MED ORDER — FUROSEMIDE 10 MG/ML IJ SOLN
20.0000 mg | Freq: Two times a day (BID) | INTRAMUSCULAR | Status: AC
  Administered 2016-03-23 (×2): 20 mg via INTRAVENOUS
  Filled 2016-03-23 (×3): qty 2

## 2016-03-23 MED ORDER — ALBUTEROL SULFATE (2.5 MG/3ML) 0.083% IN NEBU
3.0000 mL | INHALATION_SOLUTION | Freq: Two times a day (BID) | RESPIRATORY_TRACT | Status: DC
  Administered 2016-03-23 – 2016-03-27 (×8): 3 mL via RESPIRATORY_TRACT
  Filled 2016-03-23 (×9): qty 3

## 2016-03-23 NOTE — Progress Notes (Signed)
Initial Nutrition Assessment  DOCUMENTATION CODES:   Obesity unspecified  INTERVENTION:   -Continue Ensure Enlive po BID, each supplement provides 350 kcal and 20 grams of protein -Provide Magic cup TID with meals, each supplement provides 290 kcal and 9 grams of protein  -Encourage PO intake -RD to continue to monitor  NUTRITION DIAGNOSIS:   Inadequate oral intake related to poor appetite, other (see comment) (mucositis) as evidenced by per patient/family report.  GOAL:   Patient will meet greater than or equal to 90% of their needs  MONITOR:   PO intake, Supplement acceptance, Labs, Weight trends, I & O's  REASON FOR ASSESSMENT:   Malnutrition Screening Tool    ASSESSMENT:   76 yo F with a chief complaint of painful hemorrhoids. Going on for the past 3 weeks. Mildly worsening today. Said she had some trace blood on the toilet paper when she wiped. This going through chemotherapy for Hodgkin's lymphoma. Is been noted to be neutropenic, as well as required multiple blood and platelet transfusions. Discussed the hospital yesterday. Was started on a rectal foam for her hemorrhoids.  Patient in room with husband at bedside. Pt reports since last nutrition assessment (2/1), pt's appetite has decreased and pt has developed mucositis. Pt states she has noticed some changes in taste and she is getting full quickly. Pt states she is currently craving some green peppers. Encouraged pt's family to provide foods that pt is craving, pt on regular diet. Pt currently eating 1/2 cup of pudding. Pt has not been drinking ensure supplements. Pt is willing to try ice cream with meals, will order Magic cups.   Per chart review, pt has lost 8 lb since 12/20 (4% wt loss x 1.5 months, insignificant for time frame). Nutrition focused physical exam shows no sign of depletion of muscle mass or body fat.  Medications: Vitamin D tablet daily, Colace capsule BID, IV Lasix BID, Magic Mouthwash w/ Lidocaine  QID, Remeron tablet daily, K-DUR tablet BID Labs reviewed: Low K Glucose: 244  Diet Order:  Diet regular Room service appropriate? Yes; Fluid consistency: Thin  Skin:  Reviewed, no issues  Last BM:  2/11  Height:   Ht Readings from Last 1 Encounters:  03/21/16 '5\' 4"'$  (1.626 m)    Weight:   Wt Readings from Last 1 Encounters:  03/21/16 216 lb (98 kg)    Ideal Body Weight:  54.5 kg  BMI:  Body mass index is 37.08 kg/m.  Estimated Nutritional Needs:   Kcal:  1950-2150  Protein:  85-95g  Fluid:  1.9-2.1L/day  EDUCATION NEEDS:   No education needs identified at this time  Clayton Bibles, MS, RD, LDN Pager: [phone] After Hours Pager: [phone]

## 2016-03-23 NOTE — Progress Notes (Signed)
Brief Pharmacy Note re: Vancomycin  See complete note from T. Pickering from earlier today for complete details.    O:  Vanc trough= 51mg/ml on '1250mg'$  IV q12h (goal VT 15-230m/ml) A/P:  Vancomycin trough elevated.  Decrease dose to '1500mg'$  IV q24h. Re-check Vanc trough at steady state.  LOT per MD.  MiNetta CedarsPharmD, BCPS Pager: [phone]/12/2016'@8'$ :06 PM

## 2016-03-23 NOTE — Progress Notes (Signed)
Pharmacy Antibiotic Note  Diamond Boyd is a 76 y.o. female admitted on 03/21/2016 with Febrile Neutropenia.  Pharmacy has been consulted for Vancomycin and Cefepime dosing.  2/11: Afebrile. [place] risen to 1. Planning to continue Granix until 1.5. SCr stable. Single blood culture collected at [facility] is negative to date.   Plan:  Cont Cefepime 2g IV q8h.  Cont Vancomycin '1250mg'$  IV q12h; goal trough 15-20 mcg/mL.  Measure vancomycin trough this PM before 4th maintenance dose.   SCr at least q48h while on vancomycin.  Height: '5\' 4"'$  (162.6 cm) Weight: 216 lb (98 kg) IBW/kg (Calculated) : 54.7  Temp (24hrs), Avg:98.2 F (36.8 C), Min:97.8 F (36.6 C), Max:98.7 F (37.1 C)   Recent Labs Lab 03/17/16 0442 03/18/16 0550 03/21/16 1213 03/22/16 0427 03/23/16 0536  WBC 7.7 5.4 1.5* 2.0* 2.6*  CREATININE 0.92 1.08* 0.8 0.72 0.81    Estimated Creatinine Clearance: 67.2 mL/min (by C-G formula based on SCr of 0.81 mg/dL).    Allergies  Allergen Reactions  . Achromycin [Tetracycline Hcl] Other (See Comments)    UNSPECIFIED REACTION  Pt does not remember reaction  . Acyclovir And Related   . Allopurinol Other (See Comments)    REACTION: Unsure of reaction happene years ago  . Astelin [Azelastine Hcl] Other (See Comments)    Reaction unknown  . Cephalexin Other (See Comments)    REACTION: unsure of reaction happened yrs ago.  . Codeine Other (See Comments)    REACTION: abd. pain  . Lisinopril Cough  . Meloxicam Other (See Comments)    REACTION: GI symptoms  . Minocycline Other (See Comments)    Abdominal pain  . Nabumetone Other (See Comments)    REACTION: reaction not known  . Nyquil [Pseudoeph-Doxylamine-Dm-Apap] Hives  . Sulfa Antibiotics Other (See Comments)    Gi side eff   . Zolpidem Tartrate Other (See Comments)    REACTION: feels too drugged  . Buspar [Buspirone Hcl] Other (See Comments)    Dizziness, and not as effective for anxiety  . Ciprofloxacin Rash     Antimicrobials this admission: Vancomycin 2/9 >>  Cefepime 2/9 >>    Dose adjustments this admission: 2/11: VT =      Microbiology results: 2/9 blood(drawn at [facility]): ngtd    Thank you for allowing pharmacy to be a part of this patient's care.  Romeo Rabon, PharmD, pager [phone]. 03/23/2016,7:31 AM.

## 2016-03-23 NOTE — Progress Notes (Signed)
Diamond Boyd   DOB:[birth year]   XT#:[id number]    Subjective: She has been afebrile since admission. She continues to complain of mucositis pain and rectal pain from hemorrhoidal irritation, associated with minor bleeding. The Dilaudid appears to help with the hemorrhoidal pain. Her appetite is poor. She denies nausea. Her legs are swollen. She feels weak and has not walked yesterday She complained of nasal dryness and congestion of the chest  Objective:  Vitals:   03/22/16 2245 03/23/16 0515  BP: 134/60 (!) 143/71  Pulse: (!) 102 (!) 112  Resp: 18 16  Temp: 97.9 F (36.6 C) 98 F (36.7 C)     Intake/Output Summary (Last 24 hours) at 03/23/16 6270 Last data filed at 03/22/16 2245  Gross per 24 hour  Intake             1710 ml  Output                0 ml  Net             1710 ml    GENERAL:alert, no distress and comfortable SKIN: Noted significant bruises EYES: normal, Conjunctiva are pink and non-injected, sclera clear OROPHARYNX:no exudate, no erythema and lips, buccal mucosa, and tongue normal  NECK: supple, thyroid normal size, non-tender, without nodularity LYMPH:  no palpable lymphadenopathy in the cervical, axillary or inguinal LUNGS: clear to auscultation and percussion with normal breathing effort HEART: regular rate & rhythm and no murmurs and no lower extremity edema ABDOMEN:abdomen soft, non-tender and normal bowel sounds Musculoskeletal:no cyanosis of digits and no clubbing  NEURO: alert & oriented x 3 with fluent speech, no focal motor/sensory deficits   Labs:  Lab Results  Component Value Date   WBC 2.6 (L) 03/23/2016   HGB 10.7 (L) 03/23/2016   HCT 31.7 (L) 03/23/2016   MCV 91.9 03/23/2016   PLT 18 (LL) 03/23/2016   NEUTROABS 1.0 (L) 03/23/2016    Lab Results  Component Value Date   NA 138 03/23/2016   K 3.2 (L) 03/23/2016   CL 107 03/23/2016   CO2 22 03/23/2016    Studies:  Dg Chest 2 View  Result Date: 03/21/2016 CLINICAL DATA:  Weakness in  a patient with Hodgkin's lymphoma. EXAM: CHEST  2 VIEW COMPARISON:  PA and lateral chest 02/14/2016 and 01/22/2015. CT chest 01/21/2015 FINDINGS: Port-A-Cath is in place. Lungs are clear. Heart size is normal. No pneumothorax or pleural effusion. Aortic atherosclerosis is noted. Surgical clips left upper quadrant are seen. IMPRESSION: No acute disease. Atherosclerosis. Electronically Signed   By: Inge Rise M.D.   On: 03/21/2016 15:16    Assessment & Plan:   Neutropenic fever She presented with neutropenic fever, pancytopenia, hypotensive and tachycardia. Her blood pressure has improved with IV hydration Due to history of congestive heart failure, I will stop fluid hydration. I appreciate pharmacist for antimicrobial coverage. I will continue daily G-CSF until [place] is greater than 1.5 So far, urinalysis & chest x-ray were negative for source of infection. Blood culture is pending Will stop IV vancomycin tonight if blood culture remain negative by 5 PM  Recurrent classical Hodgkin lymphoma Recent PET CT scan showedhyper intense metabolic activity in the bone marrow area  Bone marrow biopsy from 03/10/16 is reviewed with the pathologist which show greater than 50% bone marrow involvement from Hodgkin lymphoma She has received cycle 1 of chemotherapy on 03/13/2016 Continue supportive care  Pancytopenia She has received 2 units of blood transfusion on 03/08/2016 She is  profoundly anemic again and she received 2 more units of blood transfusion on 03/22/2016 Continue to monitor platelet count carefully and transfuse if less than 15,000 With her platelet count trending down, she will likely need platelet transfusion tomorrow  Exarcebation of congestive heart failure with cough and orthopnea Due to signs of hypotension and tachycardia, will hold her diuretic therapy and blood pressure medications Monitor in & out and daily weight She has mild symptoms of recurrent CHF. I will give her  to IV Lasix doses  Elevated liver enzymes and leg swelling CT scan of the abdomen did not reveal the cause of abnormal liver enzymes. Incidentally, there is some mesenteric edema Could be due to volume overload and CHF We'll add TED hose for leg swelling  Poor oral intake, moderate protein calorie malnutrition This is likely due to cancer relapse Startedremeron We'll consult dietitian  Mucositis Due to chemotherapy. Continue Magic mouthwash  Rectal pain Avoid rectal suppository due to neutropenia Continue proctofoam  Significant weakness We'll consult physical therapy next week  Severe hypokalemia, improving Likely related to Zosyn and poor oral intake She has received IV and oral replacement yesterday Continue oral replacement today  DVT prophylaxis Lovenox on hold due to low platelet count  CODE STATUS Full code  Goals of care discussion The patient is aware shehas incurabledisease and treatment is strictly palliative. We discussed importance of Advanced Directives and Living will. We discussed CODE STATUS; the patient desires to remain in full code. She appointed her husband as medical healthcare power of attorney With her progressive decline and recurrent admission to the hospital, I spoke with the husband again today to consider changing goals of care  Discharge planning Not ready for discharge, likely middle of the week next week  [name], MD 03/23/2016  7:42 AM

## 2016-03-24 ENCOUNTER — Encounter (HOSPITAL_COMMUNITY): Payer: Self-pay

## 2016-03-24 ENCOUNTER — Ambulatory Visit: Payer: Self-pay | Admitting: *Deleted

## 2016-03-24 ENCOUNTER — Other Ambulatory Visit: Payer: Self-pay | Admitting: *Deleted

## 2016-03-24 DIAGNOSIS — K1231 Oral mucositis (ulcerative) due to antineoplastic therapy: Secondary | ICD-10-CM

## 2016-03-24 DIAGNOSIS — K6289 Other specified diseases of anus and rectum: Secondary | ICD-10-CM

## 2016-03-24 LAB — GLUCOSE, CAPILLARY
GLUCOSE-CAPILLARY: 340 mg/dL — AB (ref 65–99)
Glucose-Capillary: 304 mg/dL — ABNORMAL HIGH (ref 65–99)

## 2016-03-24 LAB — CBC WITH DIFFERENTIAL/PLATELET
BASOS ABS: 0 10*3/uL (ref 0.0–0.1)
Basophils Relative: 1 %
Eosinophils Absolute: 0 10*3/uL (ref 0.0–0.7)
Eosinophils Relative: 0 %
HCT: 28.6 % — ABNORMAL LOW (ref 36.0–46.0)
HEMOGLOBIN: 9.9 g/dL — AB (ref 12.0–15.0)
LYMPHS ABS: 1.1 10*3/uL (ref 0.7–4.0)
Lymphocytes Relative: 38 %
MCH: 31.3 pg (ref 26.0–34.0)
MCHC: 34.6 g/dL (ref 30.0–36.0)
MCV: 90.5 fL (ref 78.0–100.0)
MONO ABS: 0.6 10*3/uL (ref 0.1–1.0)
Monocytes Relative: 21 %
NEUTROS ABS: 1.2 10*3/uL — AB (ref 1.7–7.7)
NEUTROS PCT: 40 %
Platelets: 16 10*3/uL — CL (ref 150–400)
RBC: 3.16 MIL/uL — AB (ref 3.87–5.11)
RDW: 19.2 % — AB (ref 11.5–15.5)
WBC: 2.9 10*3/uL — AB (ref 4.0–10.5)
nRBC: 10 /100 WBC — ABNORMAL HIGH

## 2016-03-24 LAB — COMPREHENSIVE METABOLIC PANEL
ALK PHOS: 211 U/L — AB (ref 38–126)
ALT: 22 U/L (ref 14–54)
AST: 24 U/L (ref 15–41)
Albumin: 2.2 g/dL — ABNORMAL LOW (ref 3.5–5.0)
Anion gap: 9 (ref 5–15)
BILIRUBIN TOTAL: 2.1 mg/dL — AB (ref 0.3–1.2)
BUN: 9 mg/dL (ref 6–20)
CALCIUM: 9 mg/dL (ref 8.9–10.3)
CO2: 22 mmol/L (ref 22–32)
CREATININE: 0.84 mg/dL (ref 0.44–1.00)
Chloride: 105 mmol/L (ref 101–111)
Glucose, Bld: 336 mg/dL — ABNORMAL HIGH (ref 65–99)
Potassium: 2.9 mmol/L — ABNORMAL LOW (ref 3.5–5.1)
Sodium: 136 mmol/L (ref 135–145)
TOTAL PROTEIN: 5 g/dL — AB (ref 6.5–8.1)

## 2016-03-24 MED ORDER — POTASSIUM CHLORIDE CRYS ER 20 MEQ PO TBCR
20.0000 meq | EXTENDED_RELEASE_TABLET | Freq: Two times a day (BID) | ORAL | Status: AC
  Administered 2016-03-24 – 2016-03-25 (×4): 20 meq via ORAL
  Filled 2016-03-24 (×4): qty 1

## 2016-03-24 MED ORDER — SODIUM CHLORIDE 0.9 % IV SOLN
30.0000 meq | Freq: Once | INTRAVENOUS | Status: AC
  Administered 2016-03-24: 30 meq via INTRAVENOUS
  Filled 2016-03-24: qty 15

## 2016-03-24 MED ORDER — INSULIN ASPART 100 UNIT/ML ~~LOC~~ SOLN
0.0000 [IU] | Freq: Three times a day (TID) | SUBCUTANEOUS | Status: DC
  Administered 2016-03-24: 8 [IU] via SUBCUTANEOUS
  Administered 2016-03-24 (×2): 16 [IU] via SUBCUTANEOUS
  Administered 2016-03-25 (×2): 8 [IU] via SUBCUTANEOUS
  Administered 2016-03-25: 12 [IU] via SUBCUTANEOUS
  Administered 2016-03-26: 8 [IU] via SUBCUTANEOUS
  Administered 2016-03-26: 2 [IU] via SUBCUTANEOUS
  Administered 2016-03-26: 8 [IU] via SUBCUTANEOUS
  Administered 2016-03-27: 4 [IU] via SUBCUTANEOUS

## 2016-03-24 NOTE — Progress Notes (Signed)
   03/24/16 0900  Clinical Encounter Type  Visited With Patient and family together  Visit Type Initial;Psychological support;Spiritual support  Referral From Nurse  Consult/Referral To Chaplain  Spiritual Encounters  Spiritual Needs Emotional;Other (Comment) (Pastoral Conversation/Support)  Stress Factors  Patient Stress Factors Not reviewed  Family Stress Factors Not reviewed   I visited with the patient during rounding. Patient was asleep, but awoke briefly. I introduced myself and Spiritual Care, and offered to come back at a more convenient time. She and her husband stated that they would like that.    Please, contact Spiritual Care for further assistance.   Enoch M.Div.

## 2016-03-24 NOTE — Evaluation (Signed)
Physical Therapy Evaluation Patient Details Name: Diamond Boyd MRN: [id number] DOB: [birth year] Today's Date: 03/24/2016   History of Present Illness  76 yo presented to ER secondary to weakness, fever, neutropenia. DC from hospital 3 days  prior to this admission. PMhx: non Hodgkin lymphoma, anemia, CHF, colon CA, COPD  Clinical Impression  The patient  Ambulated  With RW x 800' total with 1 seated break. Pt admitted with above diagnosis. Pt currently with functional limitations due to the deficits listed below (see PT Problem List).  Pt will benefit from skilled PT to increase their independence and safety with mobility to allow discharge to the venue listed below.       Follow Up Recommendations Home health PT;Supervision/Assistance - 24 hour    Equipment Recommendations  None recommended by PT    Recommendations for Other Services       Precautions / Restrictions Precautions Precautions: Fall      Mobility  Bed Mobility   Bed Mobility: Supine to Sit     Supine to sit: Supervision     General bed mobility comments: no assistance required  Transfers Overall transfer level: Needs assistance Equipment used: Rolling walker (2 wheeled) Transfers: Sit to/from Stand              Ambulation/Gait Ambulation/Gait assistance: Min guard Ambulation Distance (Feet): 600 Feet (1 seated reast then 200)   Gait Pattern/deviations: Step-through pattern;Decreased stride length     General Gait Details: close guard for safety. and 1 standing rest break  Stairs            Wheelchair Mobility    Modified Rankin (Stroke Patients Only)       Balance Overall balance assessment: Needs assistance Sitting-balance support: No upper extremity supported;Feet supported Sitting balance-Leahy Scale: Good     Standing balance support: During functional activity Standing balance-Leahy Scale: Fair                               Pertinent Vitals/Pain Faces  Pain Scale: No hurt    Home Living Family/patient expects to be discharged to:: Private residence Living Arrangements: Spouse/significant other Available Help at Discharge: Family Type of Home: House Home Access: Stairs to enter Entrance Stairs-Rails: None Entrance Stairs-Number of Steps: 3 Home Layout: One level Home Equipment: Environmental consultant - 2 wheels;Cane - single point;Bedside commode;Shower seat      Prior Function Level of Independence: Needs assistance      ADL's / Homemaking Assistance Needed: spouse assists  Comments: Indep with household mobility; husband assists with ADLs as needed and "walks with me" when needed.  Denies fall history.     Hand Dominance        Extremity/Trunk Assessment   Upper Extremity Assessment Upper Extremity Assessment: Overall WFL for tasks assessed    Lower Extremity Assessment Lower Extremity Assessment: Overall WFL for tasks assessed    Cervical / Trunk Assessment Cervical / Trunk Assessment: Kyphotic  Communication      Cognition Arousal/Alertness: Awake/alert Behavior During Therapy: WFL for tasks assessed/performed Overall Cognitive Status: Difficult to assess                 General Comments: rambling speech, tangential.     General Comments      Exercises     Assessment/Plan    PT Assessment Patient needs continued PT services  PT Problem List Decreased activity tolerance;Decreased balance;Decreased knowledge of use of DME;Decreased mobility;Decreased strength;Decreased safety  awareness;Obesity          PT Treatment Interventions      PT Goals (Current goals can be found in the Care Plan section)  Acute Rehab PT Goals Patient Stated Goal: return home PT Goal Formulation: With patient/family Time For Goal Achievement: 04/07/16 Potential to Achieve Goals: Good    Frequency Min 3X/week   Barriers to discharge        Co-evaluation               End of Session Equipment Utilized During  Treatment: Gait belt Activity Tolerance: Patient tolerated treatment well Patient left: in bed;with family/visitor present Nurse Communication: Mobility status         Time: 0092-3300 PT Time Calculation (min) (ACUTE ONLY): 30 min   Charges:   PT Evaluation $PT Eval Low Complexity: 1 Procedure PT Treatments $Gait Training: 8-22 mins   PT G Codes:        Claretha Cooper 03/24/2016, 2:56 PM Tresa Endo PT [phone]

## 2016-03-24 NOTE — Patient Outreach (Signed)
8:58 am - Received a call from pt's spouse Elenore Rota (on consent form) following up on voice message received from RN CM.  Pt's  HIPAA/identity verified by spouse.  Spouse reports pt is currently in the hospital, plan to discharge home tomorrow.  RN CM informed spouse aware of pt's hospitalization, will follow up with pt when  discharged home.      Zara Chess.   [facility]  [phone]

## 2016-03-24 NOTE — Progress Notes (Signed)
DESARIE FEILD   DOB:02/08/1941   HD#:[id number]    Subjective: She complained of significant rectal irritation from recent hemorrhoids. She was able to walk 3 times yesterday. No further fever or chills  Objective:  Vitals:   03/23/16 2100 03/24/16 0533  BP: 134/75 126/70  Pulse: (!) 120 (!) 114  Resp: 16 16  Temp: 98.3 F (36.8 C) 98.5 F (36.9 C)     Intake/Output Summary (Last 24 hours) at 03/24/16 0646 Last data filed at 03/23/16 1439  Gross per 24 hour  Intake              770 ml  Output                0 ml  Net              770 ml    GENERAL:alert, no distress and comfortable SKIN: skin color, texture, turgor are normal, no rashes or significant lesions EYES: normal, Conjunctiva are pink and non-injected, sclera clear OROPHARYNX:no exudate, no erythema and lips, buccal mucosa, and tongue normal  NECK: supple, thyroid normal size, non-tender, without nodularity LYMPH:  no palpable lymphadenopathy in the cervical, axillary or inguinal LUNGS: clear to auscultation and percussion with normal breathing effort HEART: Tachycardia, no murmurs with moderate bilateral extremity edema ABDOMEN:abdomen soft, non-tender and normal bowel sounds Musculoskeletal:no cyanosis of digits and no clubbing  NEURO: alert & oriented x 3 with fluent speech, no focal motor/sensory deficits   Labs:  Lab Results  Component Value Date   WBC 2.9 (L) 03/24/2016   HGB 9.9 (L) 03/24/2016   HCT 28.6 (L) 03/24/2016   MCV 90.5 03/24/2016   PLT 16 (LL) 03/24/2016   NEUTROABS 1.2 (L) 03/24/2016    Lab Results  Component Value Date   NA 136 03/24/2016   K 2.9 (L) 03/24/2016   CL 105 03/24/2016   CO2 22 03/24/2016    Assessment & Plan:   Neutropenic fever She presented with neutropenic fever, pancytopenia, hypotensive and tachycardia. Her blood pressure has improved with IV hydration Due to history of congestive heart failure, I have stoppedfluid hydration. I appreciate pharmacist for  antimicrobial coverage. With negative cultures, I am stopping vancomycin. Will consider stopping cefepime tomorrow I will continue daily G-CSF until [place] is greater than 1.5 So far, urinalysis &chest x-ray were negative for source of infection. Blood culture is negative  Recurrent classical Hodgkin lymphoma Recent PET CT scan showedhyper intense metabolic activity in the bone marrow area  Bone marrow biopsy from 03/10/16 is reviewed with the pathologist which show greater than 50% bone marrow involvement from Hodgkin lymphoma She has received cycle 1 of chemotherapy on 03/13/2016 Continue supportive care  Pancytopenia She has received 2 units of blood transfusion on 03/08/2016 She is profoundly anemic again and she received 2 more units of blood transfusion on 03/22/2016 Continue to monitor platelet count carefully and transfuse if less than 15,000 With her platelet count trending down, she will likely need platelet transfusion tomorrow  Exarcebation of congestive heart failure with cough and orthopnea Due to signs of hypotension and tachycardia, will hold her diuretic therapy and blood pressure medications Monitor in & out and daily weight She has mild symptoms of recurrent CHF. I have given her intemittent lasix doses Recheck BNP tomorrow  Elevated liver enzymes and leg swelling CT scan of the abdomen did not reveal the cause of abnormal liver enzymes. Incidentally, there is some mesenteric edema Could be due to volume overload and CHF  We'll add TED hose for leg swelling  Poor oral intake, moderate protein calorie malnutrition This is likely due to cancer relapse Startedremeron We'll consult dietitian  Mucositis Due to chemotherapy. Continue Magic mouthwash  Rectal pain Avoid rectal suppository due to neutropenia Continue proctofoam  Significant weakness Will consult physical therapy   Severe hypokalemia, improving Likely related to Cefepime and poor oral  intake She has received IV and oral replacement yesterday Will continue replacement therapy & consider stopping antibiotics  DVT prophylaxis Lovenox on hold due to low platelet count  CODE STATUS Full code  Goals of care discussion The patient is aware shehas incurabledisease and treatment is strictly palliative. We discussed importance of Advanced Directives and Living will. We discussed CODE STATUS; the patient desires to remain in full code. She appointed her husband as medical healthcare power of attorney With her progressive decline and recurrent admission to the hospital, I spoke with tpatient again today to consider stopping chemotherapy in the future  Diabetes mellitus Her blood sugar is trending up With chronic exposure to steroid therapy, it is likely she is developing diabetes I will start insulin sliding scale and monitor closely She will benefit from outpatient primary care doctor management for diabetes  Discharge planning Not ready for discharge, likely middle of the week    [name], MD 03/24/2016  6:46 AM

## 2016-03-24 NOTE — Progress Notes (Signed)
PT Cancellation Note  Patient Details Name: MARGUERITE BARBA MRN: [id number] DOB: 01-12-1941   Cancelled Treatment:    Reason Eval/Treat Not Completed: Medical issues which prohibited therapy (K+ 2.9. will check back later.)   Claretha Cooper 03/24/2016, 9:56 AM

## 2016-03-25 ENCOUNTER — Encounter (HOSPITAL_COMMUNITY): Payer: Self-pay

## 2016-03-25 ENCOUNTER — Other Ambulatory Visit: Payer: Medicare Other

## 2016-03-25 DIAGNOSIS — I4891 Unspecified atrial fibrillation: Secondary | ICD-10-CM

## 2016-03-25 DIAGNOSIS — R5081 Fever presenting with conditions classified elsewhere: Secondary | ICD-10-CM

## 2016-03-25 DIAGNOSIS — E119 Type 2 diabetes mellitus without complications: Secondary | ICD-10-CM

## 2016-03-25 DIAGNOSIS — K5900 Constipation, unspecified: Secondary | ICD-10-CM

## 2016-03-25 LAB — CBC WITH DIFFERENTIAL/PLATELET
BASOS ABS: 0 10*3/uL (ref 0.0–0.1)
BASOS PCT: 1 %
EOS ABS: 0 10*3/uL (ref 0.0–0.7)
Eosinophils Relative: 1 %
HCT: 27.9 % — ABNORMAL LOW (ref 36.0–46.0)
Hemoglobin: 9.7 g/dL — ABNORMAL LOW (ref 12.0–15.0)
Lymphocytes Relative: 40 %
Lymphs Abs: 1.5 10*3/uL (ref 0.7–4.0)
MCH: 31.5 pg (ref 26.0–34.0)
MCHC: 34.8 g/dL (ref 30.0–36.0)
MCV: 90.6 fL (ref 78.0–100.0)
MONOS PCT: 23 %
Monocytes Absolute: 0.9 10*3/uL (ref 0.1–1.0)
NEUTROS PCT: 35 %
Neutro Abs: 1.3 10*3/uL — ABNORMAL LOW (ref 1.7–7.7)
PLATELETS: 15 10*3/uL — AB (ref 150–400)
RBC: 3.08 MIL/uL — ABNORMAL LOW (ref 3.87–5.11)
RDW: 19.6 % — AB (ref 11.5–15.5)
WBC: 3.7 10*3/uL — ABNORMAL LOW (ref 4.0–10.5)

## 2016-03-25 LAB — GLUCOSE, CAPILLARY
Glucose-Capillary: 227 mg/dL — ABNORMAL HIGH (ref 65–99)
Glucose-Capillary: 203 mg/dL — ABNORMAL HIGH (ref 65–99)
Glucose-Capillary: 266 mg/dL — ABNORMAL HIGH (ref 65–99)
Glucose-Capillary: 240 mg/dL — ABNORMAL HIGH (ref 65–99)
Glucose-Capillary: 200 mg/dL — ABNORMAL HIGH (ref 65–99)

## 2016-03-25 LAB — COMPREHENSIVE METABOLIC PANEL
ALBUMIN: 2.1 g/dL — AB (ref 3.5–5.0)
ALK PHOS: 184 U/L — AB (ref 38–126)
ALT: 18 U/L (ref 14–54)
ANION GAP: 5 (ref 5–15)
AST: 21 U/L (ref 15–41)
BILIRUBIN TOTAL: 2.1 mg/dL — AB (ref 0.3–1.2)
BUN: 8 mg/dL (ref 6–20)
CALCIUM: 9.1 mg/dL (ref 8.9–10.3)
CO2: 23 mmol/L (ref 22–32)
Chloride: 108 mmol/L (ref 101–111)
Creatinine, Ser: 0.76 mg/dL (ref 0.44–1.00)
GFR calc Af Amer: >60 mL/min (ref >60)
GLUCOSE: 208 mg/dL — AB (ref 65–99)
Potassium: 3.3 mmol/L — ABNORMAL LOW (ref 3.5–5.1)
Sodium: 136 mmol/L (ref 135–145)
Total Protein: 4.6 g/dL — ABNORMAL LOW (ref 6.5–8.1)

## 2016-03-25 LAB — BRAIN NATRIURETIC PEPTIDE: B NATRIURETIC PEPTIDE 5: 568.2 pg/mL — AB (ref 0.0–100.0)

## 2016-03-25 MED ORDER — METOPROLOL TARTRATE 25 MG PO TABS
12.5000 mg | ORAL_TABLET | Freq: Two times a day (BID) | ORAL | Status: DC
  Administered 2016-03-25 – 2016-03-28 (×7): 12.5 mg via ORAL
  Filled 2016-03-25 (×7): qty 1

## 2016-03-25 MED ORDER — METOPROLOL TARTRATE 5 MG/5ML IV SOLN
5.0000 mg | Freq: Once | INTRAVENOUS | Status: AC
  Administered 2016-03-25: 5 mg via INTRAVENOUS
  Filled 2016-03-25: qty 5

## 2016-03-25 MED ORDER — FUROSEMIDE 10 MG/ML IJ SOLN
40.0000 mg | Freq: Two times a day (BID) | INTRAMUSCULAR | Status: DC
  Administered 2016-03-25 – 2016-03-27 (×4): 40 mg via INTRAVENOUS
  Filled 2016-03-25 (×4): qty 4

## 2016-03-25 NOTE — Progress Notes (Signed)
Diamond Boyd   DOB:Oct 08, 1940   PO#:[id number]    Subjective: She is complaining of palpitation.The patient denies any recent signs or symptoms of bleeding such as spontaneous epistaxis, hematuria or hematochezia. No recent fevers. She felt congested. Mucositis is improving She complained of some mild rectal pain and she is now constipated. No nausea  Objective:  Vitals:   03/24/16 2137 03/25/16 0513  BP: 114/71 119/63  Pulse: (!) 120 (!) 113  Resp: 18 18  Temp: 97.6 F (36.4 C) 98.1 F (36.7 C)     Intake/Output Summary (Last 24 hours) at 03/25/16 0831 Last data filed at 03/24/16 1800  Gross per 24 hour  Intake             1130 ml  Output                0 ml  Net             1130 ml    GENERAL:alert, no distress and comfortable SKIN: Extensive bruises are noted EYES: normal, Conjunctiva are pink and non-injected, sclera clear OROPHARYNX:no exudate, no erythema and lips, buccal mucosa, and tongue normal  NECK: supple, thyroid normal size, non-tender, without nodularity LYMPH:  no palpable lymphadenopathy in the cervical, axillary or inguinal LUNGS: clear to auscultation and percussion with normal breathing effort HEART: Irregular rate and rhythm with bilateral lower extremity edema, soft systolic murmur on the left border ABDOMEN:abdomen soft, non-tender and normal bowel sounds Musculoskeletal:no cyanosis of digits and no clubbing  NEURO: alert & oriented x 3 with fluent speech, no focal motor/sensory deficits   Labs:  Lab Results  Component Value Date   WBC 3.7 (L) 03/25/2016   HGB 9.7 (L) 03/25/2016   HCT 27.9 (L) 03/25/2016   MCV 90.6 03/25/2016   PLT 15 (LL) 03/25/2016   NEUTROABS 1.3 (L) 03/25/2016    Lab Results  Component Value Date   NA 136 03/25/2016   K 3.3 (L) 03/25/2016   CL 108 03/25/2016   CO2 23 03/25/2016    Assessment & Plan:   Neutropenic fever She presented with neutropenic fever, pancytopenia, hypotensive and tachycardia. Her blood  pressure has improved with IV hydration Due to history of congestive heart failure, I have stoppedfluid hydration. I appreciate pharmacist for antimicrobial coverage. With negative cultures, I am stopping vancomycin. I will stop cefepime I will continue daily G-CSF until [place] is greater than 1.5 So far, urinalysis &chest x-ray were negative for source of infection. Blood culture is negative  Recurrent classical Hodgkin lymphoma Recent PET CT scan showedhyper intense metabolic activity in the bone marrow area  Bone marrow biopsy from 03/10/16 is reviewed with the pathologist which show greater than 50% bone marrow involvement from Hodgkin lymphoma She has received cycle 1 of chemotherapy on 03/13/2016 Continue supportive care It is unclear to me that she will be a candidate to receive further chemotherapy  Pancytopenia She has received 2 units of blood transfusion on 03/08/2016 She is profoundly anemic again and she received 2 more units of blood transfusion on 03/22/2016 Continue to monitor platelet count carefully and transfuse if less than 15,000 With her platelet count trending down, she will likely need platelet transfusion tomorrow No knee transfusion today  Exarcebation of congestive heart failure with cough and orthopnea, tachycardia Due to signs of hypotension and tachycardia, will hold her diuretic therapy and blood pressure medications Monitor in & out and daily weight She has mild symptoms of recurrent CHF. I have given her intemittent  lasix doses Recheck BNP is mildly elevated EKG looked abnormal I have consult a cardiologist and will transfer her to telemetry bed  Elevated liver enzymes and leg swelling CT scan of the abdomen did not reveal the cause of abnormal liver enzymes. Incidentally, there is some mesenteric edema Could be due to volume overload and CHF We'll add TED hose for leg swelling  Poor oral intake, moderate protein calorie malnutrition This is  likely due to cancer relapse Startedremeron We'll consult dietitian  Mucositis Due to chemotherapy. Continue Magic mouthwash  Rectal pain Avoid rectal suppository due to neutropenia Continue proctofoam  Significant weakness Will consult physical therapy   Severe hypokalemia, improving Likely related to Cefepime and poor oral intake She has received IV and oral replacement yesterday Will continue replacement therapy & consider stopping antibiotics  DVT prophylaxis Lovenox on hold due to low platelet count  CODE STATUS Full code  Goals of care discussion The patient is aware shehas incurabledisease and treatment is strictly palliative. We discussed importance of Advanced Directives and Living will. We discussed CODE STATUS; the patient desires to remain in full code. She appointed her husband as medical healthcare power of attorney With her progressive decline and recurrent admission to the hospital, I spoke with patient again today to consider stopping chemotherapy in the future. For now, she continues to want aggressive supportive care  Diabetes mellitus Her blood sugar is trending up With chronic exposure to steroid therapy, it is likely she is developing diabetes She is started on insulin sliding scale and monitor closely She will benefit from outpatient primary care doctor management for diabetes  Discharge planning Not ready for discharge With the complexity of multitude of medical complications, I have requested hospitalist service to take over her care. I spoke with Dr. Quincy Simmonds today and he has accepted the patient. We'll move her to telemetry bed   [name], MD 03/25/2016  8:31 AM

## 2016-03-25 NOTE — Care Management Important Message (Signed)
Important Message  Patient Details  Name: Diamond Boyd MRN: [id number] Date of Birth: Dec 18, 1940   Medicare Important Message Given:  Yes    Kerin Salen 03/25/2016, 11:51 AMImportant Message  Patient Details  Name: Diamond Boyd MRN: [id number] Date of Birth: [birth year]   Medicare Important Message Given:  Yes    Kerin Salen 03/25/2016, 11:51 AM

## 2016-03-25 NOTE — Consult Note (Signed)
   [facility] CM Inpatient Consult   03/25/2016  MELIDA NORTHINGTON 09-22-1940 [id number]    Spoke with Mrs. Lancon and husband at bedside. Mrs. Chunn recently signed up with [facility] during last hospitalization. Therefore, Mrs.Oxendine recently active became active with [facility] program. Discussed with patient and husband about [facility] RNCM attempts to reach Mrs. Polo prior to readmission. Both patient and husband state they want ongoing follow up from [facility] services. Mrs. Bradt endorses she is also active with [facility]. Contact information provided to Mr. Skidmore .  Mrs. Bucker also asks for more information about DM and what to eat. Made inpatient RNCM aware of the above. Will make patient's nurse aware. Also sent notification to [place] of bedside visit. Noted Dr. Calton Dach notes regarding goals of care discussion. Will continue to follow.    Marthenia Rolling, MSN-Ed, RN,BSN [facility] Liaison [phone]

## 2016-03-25 NOTE — Progress Notes (Signed)
Physical Therapy Treatment Patient Details Name: Diamond Boyd MRN: [id number] DOB: 1940/07/24 Today's Date: 03/25/2016    History of Present Illness 76 yo presented to ER secondary to weakness, fever, neutropenia. DC from hospital 3 days  prior to this admission. PMhx: non Hodgkin lymphoma, anemia, CHF, colon CA, COPD    PT Comments    Pt in bathroom with spouse who assisted with peri care.  Assisted with amb a limted distance in hallway with a walker avg HR 110 and RA 94%.   Pt plans to return home with spouse.   Follow Up Recommendations  Home health PT     Equipment Recommendations  None recommended by PT  Has a walker, has a cane  Recommendations for Other Services       Precautions / Restrictions Precautions Precautions: Fall Precaution Comments: monitor HR Restrictions Weight Bearing Restrictions: No    Mobility  Bed Mobility               General bed mobility comments: OOB  Transfers Overall transfer level: Needs assistance Equipment used: Rolling walker (2 wheeled);None Transfers: Sit to/from [facility] Sit to Stand: Min guard;Supervision Stand pivot transfers: Min guard;Supervision       General transfer comment: close guard for safety. 25% VCs hand placement  Ambulation/Gait Ambulation/Gait assistance: Min guard Ambulation Distance (Feet): 145 Feet Assistive device: Rolling walker (2 wheeled) Gait Pattern/deviations: Step-through pattern;Decreased stride length Gait velocity: WFL   General Gait Details: close guard for safety. and 1 standing rest break.  Decreased amb distance this session per RN request "don't push it".   Avg HR 110 and RA 94% with 2/4 DOE   Financial trader [employer] (Stroke Patients Only)       Balance                                    Cognition Arousal/Alertness: Awake/alert Behavior During Therapy: WFL for tasks assessed/performed Overall  Cognitive Status: Difficult to assess                 General Comments: rambling speech, tangential.     Exercises      General Comments        Pertinent Vitals/Pain Pain Assessment: No/denies pain    Home Living                      Prior Function            PT Goals (current goals can now be found in the care plan section) Progress towards PT goals: Progressing toward goals    Frequency    Min 3X/week      PT Plan Current plan remains appropriate    Co-evaluation             End of Session Equipment Utilized During Treatment: Gait belt Activity Tolerance: Patient tolerated treatment well Patient left: in chair;with call bell/phone within reach;with family/visitor present     Time: 7673-4193 PT Time Calculation (min) (ACUTE ONLY): 14 min  Charges:  $Gait Training: 8-22 mins                    G Codes:      Rica Koyanagi  PTA [facility]  Acute  Rehab Pager      [phone]

## 2016-03-25 NOTE — Consult Note (Signed)
Cardiology Consult    Patient ID: Diamond Boyd MRN: [id number], DOB/AGE: [birth year]   The patient has been seen in conjunction with Harlan Stains, NP-C. All aspects of care have been considered and discussed. The patient has been personally interviewed, examined, and all clinical data has been reviewed.   Agree with contents of this note. The patient had increased heart rates likely related to episodes of PSVT.  Agree with resumption of beta blocker therapy.  Continue to monitor for recurrence of supraventricular arrhythmia.  No definite atrial fibrillation has been identified this point.  Replete potassium.    Admit date: 03/21/2016 Date of Consult: 03/25/2016  Primary Physician: [name], MD Primary Cardiologist: Dr. Claiborne Billings Requesting Provider: Dr. Alvy Bimler Reason for Consultation: AF  Patient Profile    76 yo female with PMH of colon CA, Hodgkin's lymphoma (currently undergoing chemo), anemia, PAF, NICM who presented with fever, and generalized weakness. Transferred to telemetry after developing elevated HR.   Past Medical History   Past Medical History:  Diagnosis Date  . Anemia    hx blood tx  . Asthma   . Benign essential HTN   . Cancer ([place])    skin cancer- basal cell on arm / colon 2011  . Chronic systolic CHF (congestive heart failure), NYHA class 2 ([place]) 01/2015  . Colon cancer ([place])    2011, s/p surgery, in remission  . Colon polyps   . Degenerative disk disease    Thoracic spine  . Depression   . 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 Generalized weakness 05/11/2015  . GERD (gastroesophageal reflux disease)   . Heart murmur   . hodgkins lymphoma   . Hypothyroidism   . Lymphoma ([place])   . Neuropathy due to chemotherapeutic drug ([place]) 06/19/2014  . NICM (nonischemic cardiomyopathy) ([place]) 01/2015  . Osteoarthritis    hands  . Other asplenic status  04/01/2011  . PAF (paroxysmal atrial fibrillation) ([place])   . Peripheral vascular disease ([place])   . Pneumonia     Past Surgical History:  Procedure Laterality Date  . BONE MARROW BIOPSY  05/27/13  . BREAST BIOPSY  1996  . CARDIAC CATHETERIZATION N/A 01/25/2015   Procedure: Left Heart Cath and Coronary Angiography;  Surgeon: Peter M Martinique, MD;  Location: [facility];  Service: Cardiovascular;  Laterality: N/A;  . CATARACT EXTRACTION, BILATERAL  2016  . CHOLECYSTECTOMY    . COLECTOMY  8/11  . COLONOSCOPY W/ BIOPSIES    . DG BIOPSY LUNG    . LYMPH NODE BIOPSY N/A 11/01/2014   Procedure: MEDIASTINAL LYMPH NODE BIOPSY;  Surgeon: Grace Isaac, MD;  Location: [facility];  Service: Thoracic;  Laterality: N/A;  . MEDIASTINOSCOPY N/A 11/01/2014   Procedure: MEDIASTINOSCOPY;  Surgeon: Grace Isaac, MD;  Location: [facility];  Service: Thoracic;  Laterality: N/A;  . PLANTAR FASCIA RELEASE    . port-a-cath insertion    . SINUS ENDO W/FUSION Bilateral 02/20/2016   Procedure: BILATERAL MAXILLARY ANTROSTOMY, BILATERAL ANTERIOR ETHMOIDECTOMY, BILATERAL FRONTAL RECESS EXPLORATION, FUSION IMAGE GUIDANCE;  Surgeon: Melida Quitter, MD;  Location: [facility];  Service: ENT;  Laterality: Bilateral;  . SPLENECTOMY     lymphoma  . TENDON RELEASE     Right thumb   . VAGINAL HYSTERECTOMY    . VENTRAL HERNIA REPAIR  1998  . VIDEO BRONCHOSCOPY WITH ENDOBRONCHIAL ULTRASOUND N/A 10/17/2014   Procedure: VIDEO BRONCHOSCOPY  WITH ENDOBRONCHIAL ULTRASOUND;  Surgeon: Javier Glazier, MD;  Location: [facility];  Service: Thoracic;  Laterality: N/A;  . VIDEO BRONCHOSCOPY WITH ENDOBRONCHIAL ULTRASOUND N/A 11/01/2014   Procedure: VIDEO BRONCHOSCOPY WITH ENDOBRONCHIAL ULTRASOUND;  Surgeon: Grace Isaac, MD;  Location: [facility];  Service: Thoracic;  Laterality: N/A;     Allergies  Allergies  Allergen Reactions  . Achromycin [Tetracycline Hcl] Other (See Comments)    UNSPECIFIED REACTION  Pt does not remember reaction  .  Acyclovir And Related   . Allopurinol Other (See Comments)    REACTION: Unsure of reaction happene years ago  . Astelin [Azelastine Hcl] Other (See Comments)    Reaction unknown  . Cephalexin Other (See Comments)    REACTION: unsure of reaction happened yrs ago.  . Codeine Other (See Comments)    REACTION: abd. pain  . Lisinopril Cough  . Meloxicam Other (See Comments)    REACTION: GI symptoms  . Minocycline Other (See Comments)    Abdominal pain  . Nabumetone Other (See Comments)    REACTION: reaction not known  . Nyquil [Pseudoeph-Doxylamine-Dm-Apap] Hives  . Sulfa Antibiotics Other (See Comments)    Gi side eff   . Zolpidem Tartrate Other (See Comments)    REACTION: feels too drugged  . Buspar [Buspirone Hcl] Other (See Comments)    Dizziness, and not as effective for anxiety  . Ciprofloxacin Rash    History of Present Illness    Mrs. Artist is a 76 yo female with PMH of  colon CA, Hodgkin's lymphoma (currently undergoing chemo), anemia, PAF, NICM. She is followed by Dr. Claiborne Billings in the outpatient setting. She was admitted in 2016 and had an echo showing EF of 45-50% with mild trop elevation that was felt to be due to demand ischemia. She was then readmitted with AF RVR and inpatient myoview showed possible anterior scar with minimal peri-infarction ischemia.  Cardiac catheterization revealed mild nonobstructive CAD.  She was anemic but was cleared for anticoagulation by GI.  The plan was to proceed with cardioversion after 4 weeks of uninterrupted anticoagulation if she remained in atrial fibrillation. When she followed up in the office, she was noted to be back in [place]. Attempted eliquis again but developed recurrent rectal bleeding.   She was seen for preop evaluation back in 02/18/16 for sinus surgery and recurrent epistaxis. She was BB therapy at that time, but her ARB was held in the setting of hypotension.   Recently discharged from lengthy admission under the care of Dr.  Alvy Bimler for her recurrent hodgkin lymphoma, pancytopenia and CHF exacerbation and discharged on 03/18/16. Had a bone marrow biopsy on 03/10/16 showing greater than 50% bone marrow involvement, and last chemotherapy was on 03/13/16. Also received 2 units of blood that admission.   Presented back and readmitted on 03/21/16 with neutropenic fever, pancytopenia and weakness. Noted to be tachycardic, hypotensive and febrile on admission. She was started on antibiotics, with her diuretic and antihypertensives held. UA and CXR negative for acute infection. Blood pressure improved and she was given a few doses of IV lasix. This morning she c/o palpitations, and progressive weakness. EKG was done that showed ST with incomplete LBBB with PACs. She was transferred to telemetry floor for further management. Denies any chest pain, mild dyspnea, and lower extremity edema. Cardiology has been consulted to evaluate elevated HR.   Inpatient Medications    . acyclovir  400 mg Oral Daily  . albuterol  3 mL Inhalation BID  . cholecalciferol  1,000 Units Oral Daily  . docusate sodium  100 mg Oral BID  . estradiol  1 mg Oral Daily  . feeding supplement (ENSURE ENLIVE)  237 mL Oral BID BM  . FLUoxetine  20 mg Oral Daily  . fluticasone  1 spray Each Nare BID  . fluticasone furoate-vilanterol  1 puff Inhalation QPM  . insulin aspart  0-24 Units Subcutaneous TID WC  . levothyroxine  112 mcg Oral QAC breakfast  . magic mouthwash w/lidocaine  5 mL Oral QID  . mirtazapine  15 mg Oral QHS  . potassium chloride  20 mEq Oral BID  . Tbo-filgastrim (GRANIX) SQ  300 mcg Subcutaneous q1800    Family History    Family History  Problem Relation Age of Onset  . Coronary artery disease Mother   . Diabetes Mother   . Fibromyalgia Daughter     chronic pain   . COPD Daughter   . Diabetes Brother   . Asthma Daughter   . Rheum arthritis Brother   . Clotting disorder Brother   . Alcohol abuse Sister   . Colon cancer Neg Hx   .  Colon polyps Neg Hx   . Stomach cancer Neg Hx   . Rectal cancer Neg Hx   . Ulcerative colitis Neg Hx   . Crohn's disease Neg Hx     Social History    Social History   Social History  . Marital status: Married    Spouse name: N/A  . Number of children: 2  . Years of education: N/A   Occupational History  . Retired  Retired   Social History Main Topics  . Smoking status: Never Smoker  . Smokeless tobacco: Never Used     Comment: Second-hand exposure through father.  . Alcohol use No  . Drug use: No  . Sexual activity: No   Other Topics Concern  . Not on file   Social History Narrative   Originally from [place]. Always lived in [place]. Previously has traveled to [facility], [place] & up [employer] to [place]. No international travel. No pets currently. Remote parakeet exposure with her children. 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    Lives at home with husband. Weakness due to chemo, but otherwise independant        Review of Systems    General:  No chills, ++ fever, night sweats ++ weight changes.  Cardiovascular:  No chest pain, ++ dyspnea on exertion, ++ edema, orthopnea, ++ palpitations, paroxysmal nocturnal dyspnea. Dermatological: No rash, lesions/masses Respiratory: No cough, dyspnea Urologic: No hematuria, dysuria Abdominal:   No nausea, vomiting, diarrhea, bright red blood per rectum, melena, or hematemesis Neurologic:  No visual changes, ++ wkns, changes in mental status. All other systems reviewed and are otherwise negative except as noted above.  Physical Exam    Blood pressure (!) 146/82, pulse (!) 165, temperature 98 F (36.7 C), temperature source Oral, resp. rate 18, height 5' 4"  (1.626 m), weight 226 lb 13.7 oz (102.9 kg), last menstrual period 02/10/1970, SpO2 94 %.  General: Pleasant older WF, NAD Psych: Normal affect. Neuro: Alert and oriented X 3. Moves all extremities spontaneously. HEENT: Normal  Neck: Supple without bruits or  JVD. Lungs:  Resp regular and unlabored, CTA. Heart: Tachy no s3, s4, 2/6 systolic murmurs. Abdomen: Soft, non-tender, non-distended, BS + x 4.  Extremities: No clubbing, cyanosis, 2+ bilateral LE edema. DP/PT/Radials 2+ and equal bilaterally.  Labs    Troponin [facility] of Care Test)  No results for input(s): TROPIPOC in the last 72 hours. No results for input(s): CKTOTAL, CKMB, TROPONINI in the last 72 hours. Lab Results  Component Value Date   WBC 3.7 (L) 03/25/2016   HGB 9.7 (L) 03/25/2016   HCT 27.9 (L) 03/25/2016   MCV 90.6 03/25/2016   PLT 15 (LL) 03/25/2016    Recent Labs Lab 03/25/16 0500  NA 136  K 3.3*  CL 108  CO2 23  BUN 8  CREATININE 0.76  CALCIUM 9.1  PROT 4.6*  BILITOT 2.1*  ALKPHOS 184*  ALT 18  AST 21  GLUCOSE 208*   Lab Results  Component Value Date   CHOL 148 10/24/2015   HDL 14.90 (L) 10/24/2015   LDLCALC 95 01/25/2015   TRIG 310.0 (H) 10/24/2015   Lab Results  Component Value Date   DDIMER 2.76 (H) 04/23/2013     Radiology Studies    Dg Chest 2 View  Result Date: 03/21/2016 CLINICAL DATA:  Weakness in a patient with Hodgkin's lymphoma. EXAM: CHEST  2 VIEW COMPARISON:  PA and lateral chest 02/14/2016 and 01/22/2015. CT chest 01/21/2015 FINDINGS: Port-A-Cath is in place. Lungs are clear. Heart size is normal. No pneumothorax or pleural effusion. Aortic atherosclerosis is noted. Surgical clips left upper quadrant are seen. IMPRESSION: No acute disease. Atherosclerosis. Electronically Signed   By: Inge Rise M.D.   On: 03/21/2016 15:16    ECG & Cardiac Imaging    EKG: ST, incomplete LBBB with PACs  Echo: 2/17  Study Conclusions  - Left ventricle: The cavity size was normal. Wall thickness was   normal. Systolic function was mildly reduced. The estimated   ejection fraction was in the range of 45% to 50%. Mild diffuse   hypokinesis with no identifiable regional variations. Features   are consistent with a pseudonormal left  ventricular filling   pattern, with concomitant abnormal relaxation and increased   filling pressure (grade 2 diastolic dysfunction). - Mitral valve: There was mild regurgitation. - Left atrium: The atrium was moderately to severely dilated.  Assessment & Plan    75 yo female with PMH of colon CA, Hodgkin's lymphoma (currently undergoing chemo), anemia, PAF, NICM who presented with fever, and generalized weakness. Transferred to telemetry after developing elevated HR.   1. SVT: Reports she developed onset of palpitations this morning, and fluttering in her chest. EKG showed ST with PACs and incomplete LBBB. Denies any chest pain, but mild dyspnea. Telemetry shows ST with intermittent episodes of SVT.  -- Will give 72m IV lopressor, and restart metoprolol 12.511mBID  2. Hx of PAF: Does not appear to be AF on telemetry at this time. Resume BB. [facility] the past with recurrent GI bleeding, also noted to have pancytopenia, therefore not a candidate for [facility] this time.   3. Chronic combined HF: BNP today 568, up from admission, and noted LE edema. Weight is up 216>>226 -- start IV lasix 4020mID, monitor I&Os and daily weights  4. Hypoalbuminemia: 2.1, likely contributing to LE edema  5. Pancytopenia: Oncology following, platelet 15,000 today.   6. Hypokalemia: Replaced, monitor with diuresis.  SigBarnet PallP-C Pager [phone]/2018, 10:20 AM

## 2016-03-25 NOTE — Progress Notes (Signed)
Inpatient Diabetes Program Recommendations  AACE/ADA: New Consensus Statement on Inpatient Glycemic Control (2015)  Target Ranges:  Prepandial:   less than 140 mg/dL      Peak postprandial:   less than 180 mg/dL (1-2 hours)      Critically ill patients:  140 - 180 mg/dL   Lab Results  Component Value Date   GLUCAP 266 (H) 03/25/2016   HGBA1C 6.4 10/24/2015    Review of Glycemic Control  Blood sugars consistently > 180 mg/dL. Would likely benefit from addition of basal insulin.  Inpatient Diabetes Program Recommendations:    Add Lantus 12 units QHS Add Novolog 3 units tidwc for meal coverage insulin  Continue to follow.  Thank you. Lorenda Peck, RD, LDN, CDE Inpatient Diabetes Coordinator [phone]

## 2016-03-25 NOTE — Progress Notes (Signed)
Report called to April, RN on [address]. Will transfer to rm [id number]

## 2016-03-26 ENCOUNTER — Encounter (HOSPITAL_COMMUNITY): Payer: Self-pay

## 2016-03-26 DIAGNOSIS — E876 Hypokalemia: Secondary | ICD-10-CM

## 2016-03-26 DIAGNOSIS — R600 Localized edema: Secondary | ICD-10-CM

## 2016-03-26 DIAGNOSIS — K640 First degree hemorrhoids: Secondary | ICD-10-CM

## 2016-03-26 DIAGNOSIS — R748 Abnormal levels of other serum enzymes: Secondary | ICD-10-CM

## 2016-03-26 DIAGNOSIS — I5043 Acute on chronic combined systolic (congestive) and diastolic (congestive) heart failure: Secondary | ICD-10-CM

## 2016-03-26 LAB — CBC WITH DIFFERENTIAL/PLATELET
Basophils Absolute: 0.1 10*3/uL (ref 0.0–0.1)
Basophils Relative: 1 %
EOS ABS: 0 10*3/uL (ref 0.0–0.7)
EOS PCT: 0 %
HCT: 29.8 % — ABNORMAL LOW (ref 36.0–46.0)
Hemoglobin: 10.2 g/dL — ABNORMAL LOW (ref 12.0–15.0)
Lymphocytes Relative: 26 %
Lymphs Abs: 1.4 10*3/uL (ref 0.7–4.0)
MCH: 31.5 pg (ref 26.0–34.0)
MCHC: 34.2 g/dL (ref 30.0–36.0)
MCV: 92 fL (ref 78.0–100.0)
Monocytes Absolute: 1.2 10*3/uL — ABNORMAL HIGH (ref 0.1–1.0)
Monocytes Relative: 23 %
NEUTROS PCT: 50 %
Neutro Abs: 2.7 10*3/uL (ref 1.7–7.7)
Platelets: 15 10*3/uL — CL (ref 150–400)
RBC: 3.24 MIL/uL — AB (ref 3.87–5.11)
RDW: 20.1 % — AB (ref 11.5–15.5)
WBC: 5.4 10*3/uL (ref 4.0–10.5)

## 2016-03-26 LAB — COMPREHENSIVE METABOLIC PANEL
ALT: 23 U/L (ref 14–54)
ANION GAP: 6 (ref 5–15)
AST: 25 U/L (ref 15–41)
Albumin: 2.4 g/dL — ABNORMAL LOW (ref 3.5–5.0)
Alkaline Phosphatase: 238 U/L — ABNORMAL HIGH (ref 38–126)
BUN: 11 mg/dL (ref 6–20)
CALCIUM: 9.8 mg/dL (ref 8.9–10.3)
CHLORIDE: 107 mmol/L (ref 101–111)
CO2: 25 mmol/L (ref 22–32)
CREATININE: 0.87 mg/dL (ref 0.44–1.00)
Glucose, Bld: 168 mg/dL — ABNORMAL HIGH (ref 65–99)
Potassium: 3.2 mmol/L — ABNORMAL LOW (ref 3.5–5.1)
Sodium: 138 mmol/L (ref 135–145)
Total Bilirubin: 2.2 mg/dL — ABNORMAL HIGH (ref 0.3–1.2)
Total Protein: 5.1 g/dL — ABNORMAL LOW (ref 6.5–8.1)

## 2016-03-26 LAB — GLUCOSE, CAPILLARY
GLUCOSE-CAPILLARY: 155 mg/dL — AB (ref 65–99)
GLUCOSE-CAPILLARY: 201 mg/dL — AB (ref 65–99)
Glucose-Capillary: 239 mg/dL — ABNORMAL HIGH (ref 65–99)
Glucose-Capillary: 247 mg/dL — ABNORMAL HIGH (ref 65–99)

## 2016-03-26 LAB — HEMOGLOBIN A1C
Hgb A1c MFr Bld: 7.2 % — ABNORMAL HIGH (ref 4.8–5.6)
MEAN PLASMA GLUCOSE: 160 mg/dL

## 2016-03-26 LAB — MAGNESIUM: Magnesium: 1.8 mg/dL (ref 1.7–2.4)

## 2016-03-26 MED ORDER — POTASSIUM CHLORIDE CRYS ER 20 MEQ PO TBCR
40.0000 meq | EXTENDED_RELEASE_TABLET | Freq: Two times a day (BID) | ORAL | Status: AC
  Administered 2016-03-26 (×2): 40 meq via ORAL
  Filled 2016-03-26 (×2): qty 2

## 2016-03-26 MED ORDER — MAGNESIUM SULFATE 2 GM/50ML IV SOLN
2.0000 g | Freq: Once | INTRAVENOUS | Status: AC
  Administered 2016-03-26: 2 g via INTRAVENOUS
  Filled 2016-03-26: qty 50

## 2016-03-26 MED ORDER — SODIUM CHLORIDE 0.9% FLUSH: 10.0000 mL | INTRAVENOUS | Status: DC | PRN

## 2016-03-26 MED ORDER — BENZONATATE 100 MG PO CAPS
100.0000 mg | ORAL_CAPSULE | Freq: Three times a day (TID) | ORAL | Status: DC | PRN
  Administered 2016-03-26 – 2016-03-27 (×3): 100 mg via ORAL
  Filled 2016-03-26 (×3): qty 1

## 2016-03-26 NOTE — Progress Notes (Signed)
PROGRESS NOTE    Diamond Boyd  ZOX:[id number] DOB: 1940-11-15 DOA: 03/21/2016 PCP: [name], MD    Brief Narrative:  76yo who initially presented to the Oncology service on 2/9 for management of neutropenic fever following recent hospital discharge. Patient was noted to be hypotensive and tachycardic. Patient was started on empiric broad spectrum abx  And required 2 units of PRBC's. During work up, patient was given daily G-CSF for neutropenia. Patient developed signs of acute CHF exacerbation. Cardiology was consulted and patient transferred to hospitalist service.  Assessment & Plan:   Principal Problem:   Neutropenic fever ([place]) Active Problems:   Hodgkin's lymphoma in relapse ([facility])   Elevated liver enzymes   Immunocompromised status associated with infection ([facility])   Atrial fibrillation with rapid ventricular response ([facility])   Hypoalbuminemia due to protein-calorie malnutrition ([facility])   Pancytopenia, acquired ([place])   Hemorrhoids   1. Neutropenic fever 1. No longer neutropenic 2. Afebrile overnight 3. Will stop G-CSF 4. Repeat CBC in AM 5. Now off empiric abx. No source of infection identified 2. Recurrent Hodgkin lymphoma 1. Oncology following 2. Pt is s/p chemo on 2/1 3. Discussed case with Dr. Alvy Bimler this AM over phone. Concerns that patient may not be a candidate for further chemo in the future. 4. Dr. Alvy Bimler has already consulted Palliative Care for goals of care. Will follow up on recs. 3. Pancytopenia 1. Remains thrombocytopenic 2. WBC and hgb remain stable. Labs reviewed 4. Acutely decompensated systolic CHF exacerbation 1. Tolerating lasix 2. Cardiology is following 3. Most recent EF of 45-50% per 2/17 2d echo 5. Elevated LFT's 1. Remain elevated 2. Pt denies abd discomfort 3. Question relation to underlying lymphoma 4. CT abd from 1/18 reviewed. No evidence of biliary obstruction. Pt is s/p cholecystectomy 6. Mod protein calorie  malnutrition 1. Encourage PO as tolerated 2. Appears stable at this time 7. Hypokalemia 1. K of 3.2 this AM. Will replace 2. Check lytes in AM 8. Hypomagnesemia 1. Mg of 1.8. Will give 2gm of Mg 9. Weakness 1. PT consulted with recs for home health PT  DVT prophylaxis: TED hose Code Status: Full Family Communication: Pt in room, family at bedside Disposition Plan: Uncertain at this time  Consultants:   Oncology  Palliative Care  Cardiology  Procedures:     Antimicrobials: Anti-infectives    Start     Dose/Rate Route Frequency Ordered Stop   03/24/16 0600  vancomycin (VANCOCIN) 1,500 mg in sodium chloride 0.9 % 500 mL IVPB  Status:  Discontinued     1,500 mg 250 mL/hr over 120 Minutes Intravenous Every 24 hours 03/23/16 2004 03/24/16 0525   03/22/16 1000  acyclovir (ZOVIRAX) tablet 400 mg     400 mg Oral Daily 03/21/16 1458     03/22/16 0600  vancomycin (VANCOCIN) 1,250 mg in sodium chloride 0.9 % 250 mL IVPB  Status:  Discontinued     1,250 mg 166.7 mL/hr over 90 Minutes Intravenous Every 12 hours 03/21/16 2001 03/23/16 2004   03/21/16 2200  ceFEPIme (MAXIPIME) 2 g in dextrose 5 % 50 mL IVPB  Status:  Discontinued     2 g 100 mL/hr over 30 Minutes Intravenous Every 8 hours 03/21/16 2001 03/25/16 0841   03/21/16 1700  vancomycin (VANCOCIN) 1,500 mg in sodium chloride 0.9 % 500 mL IVPB     1,500 mg 250 mL/hr over 120 Minutes Intravenous  Once 03/21/16 1521 03/21/16 1803   03/21/16 1600  vancomycin (VANCOCIN) 1,500 mg in sodium  chloride 0.9 % 500 mL IVPB  Status:  Discontinued     1,500 mg 250 mL/hr over 120 Minutes Intravenous  Once 03/21/16 1518 03/21/16 1521   03/21/16 1600  ceFEPIme (MAXIPIME) 2 g in dextrose 5 % 50 mL IVPB     2 g 100 mL/hr over 30 Minutes Intravenous  Once 03/21/16 1520 03/21/16 1633       Subjective: No complaints at present  Objective: Vitals:   03/26/16 0611 03/26/16 0615 03/26/16 1058 03/26/16 1100  BP: (!) 125/54     Pulse: 89      Resp: 20     Temp: 98.6 F (37 C) 99.4 F (37.4 C)    TempSrc: Oral Axillary    SpO2: 100%  95% 95%  Weight:      Height:        Intake/Output Summary (Last 24 hours) at 03/26/16 1321 Last data filed at 03/26/16 1005  Gross per 24 hour  Intake              100 ml  Output              200 ml  Net             -100 ml   Filed Weights   03/21/16 1456 03/25/16 0513  Weight: 98 kg (216 lb) 102.9 kg (226 lb 13.7 oz)    Examination:  General exam: Appears calm and comfortable  Respiratory system: Clear to auscultation. Respiratory effort normal. Cardiovascular system: S1 & S2 heard, RRR. Gastrointestinal system: Abdomen is nondistended, soft and nontender. No organomegaly or masses felt. Normal bowel sounds heard. Central nervous system: Alert and oriented. No focal neurological deficits. Extremities: Symmetric 5 x 5 power. Skin: No rashes, lesions  Psychiatry: Judgement and insight appear normal. Mood & affect appropriate.   Data Reviewed: I have personally reviewed following labs and imaging studies  CBC:  Recent Labs Lab 03/22/16 0427 03/23/16 0536 03/24/16 0516 03/25/16 0722 03/26/16 0602  WBC 2.0* 2.6* 2.9* 3.7* 5.4  NEUTROABS 0.7* 1.0* 1.2* 1.3* 2.7  HGB 7.1* 10.7* 9.9* 9.7* 10.2*  HCT 21.5* 31.7* 28.6* 27.9* 29.8*  MCV 92.7 91.9 90.5 90.6 92.0  PLT 21* 18* 16* 15* 15*   Basic Metabolic Panel:  Recent Labs Lab 03/22/16 0427 03/23/16 0536 03/24/16 0516 03/25/16 0500 03/26/16 0602  NA 137 138 136 136 138  K 2.8* 3.2* 2.9* 3.3* 3.2*  CL 105 107 105 108 107  CO2 '24 22 22 23 25  '$ GLUCOSE 219* 293* 336* 208* 168*  BUN '9 7 9 8 11  '$ CREATININE 0.72 0.81 0.84 0.76 0.87  CALCIUM 8.5* 8.7* 9.0 9.1 9.8  MG  --   --   --   --  1.8   GFR: Estimated Creatinine Clearance: 64.3 mL/min (by C-G formula based on SCr of 0.87 mg/dL). Liver Function Tests:  Recent Labs Lab 03/22/16 0427 03/23/16 0536 03/24/16 0516 03/25/16 0500 03/26/16 0602  AST '25 29 24 21 25   '$ ALT '22 23 22 18 23  '$ ALKPHOS 215* 244* 211* 184* 238*  BILITOT 1.6* 2.0* 2.1* 2.1* 2.2*  PROT 4.6* 5.4* 5.0* 4.6* 5.1*  ALBUMIN 2.2* 2.7* 2.2* 2.1* 2.4*   No results for input(s): LIPASE, AMYLASE in the last 168 hours. No results for input(s): AMMONIA in the last 168 hours. Coagulation Profile: No results for input(s): INR, PROTIME in the last 168 hours. Cardiac Enzymes: No results for input(s): CKTOTAL, CKMB, CKMBINDEX, TROPONINI in the last 168 hours.  BNP (last 3 results) No results for input(s): PROBNP in the last 8760 hours. HbA1C:  Recent Labs  03/25/16 0721  HGBA1C 7.2*   CBG:  Recent Labs Lab 03/25/16 1150 03/25/16 1643 03/25/16 2214 03/26/16 0744 03/26/16 1146  GLUCAP 266* 240* 200* 155* 239*   Lipid Profile: No results for input(s): CHOL, HDL, LDLCALC, TRIG, CHOLHDL, LDLDIRECT in the last 72 hours. Thyroid Function Tests: No results for input(s): TSH, T4TOTAL, FREET4, T3FREE, THYROIDAB in the last 72 hours. Anemia Panel: No results for input(s): VITAMINB12, FOLATE, FERRITIN, TIBC, IRON, RETICCTPCT in the last 72 hours. Sepsis Labs: No results for input(s): PROCALCITON, LATICACIDVEN in the last 168 hours.  Recent Results (from the past 240 hour(s))  Culture, blood (single) w Reflex to ID Panel     Status: None (Preliminary result)   Collection Time: 03/21/16  1:48 PM  Result Value Ref Range Status   BLOOD CULTURE, ROUTINE Preliminary report  Preliminary   RESULT 1 Comment  Preliminary    Comment: No growth in 36 - 48 hours.     Radiology Studies: No results found.  Scheduled Meds: . acyclovir  400 mg Oral Daily  . albuterol  3 mL Inhalation BID  . cholecalciferol  1,000 Units Oral Daily  . docusate sodium  100 mg Oral BID  . estradiol  1 mg Oral Daily  . feeding supplement (ENSURE ENLIVE)  237 mL Oral BID BM  . FLUoxetine  20 mg Oral Daily  . fluticasone  1 spray Each Nare BID  . fluticasone furoate-vilanterol  1 puff Inhalation QPM  .  furosemide  40 mg Intravenous BID  . insulin aspart  0-24 Units Subcutaneous TID WC  . levothyroxine  112 mcg Oral QAC breakfast  . magic mouthwash w/lidocaine  5 mL Oral QID  . metoprolol tartrate  12.5 mg Oral BID  . mirtazapine  15 mg Oral QHS  . potassium chloride  40 mEq Oral BID   Continuous Infusions:   LOS: 5 days   Kyrie Bun, Orpah Melter, MD [facility] Pager [phone]  If 7PM-7AM, please contact night-coverage [email or web address] Password [email or web address] 03/26/2016, 1:21 PM

## 2016-03-26 NOTE — Progress Notes (Signed)
Pt was active with [facility]. Will continue to follow for discharge plans.

## 2016-03-26 NOTE — Progress Notes (Signed)
Diamond Boyd   DOB:[birth year]   JM#:[id number]    Subjective: She is complaining of severe rectal pain from hemorrhoidal irritation. Minor bleeding. No recurrence of fevers. Poor appetite. She complained of severe leg edema. No chest pain or dyspnea. Mucositis pain is improving  Objective:  Vitals:   03/26/16 0611 03/26/16 0615  BP: (!) 125/54   Pulse: 89   Resp: 20   Temp: 98.6 F (37 C) 99.4 F (37.4 C)     Intake/Output Summary (Last 24 hours) at 03/26/16 1924 Last data filed at 03/26/16 1724  Gross per 24 hour  Intake              460 ml  Output              200 ml  Net              260 ml    GENERAL:alert, no distress and comfortable SKIN: skin color, texture, turgor are normal, no rashes or significant lesions. Noted burises EYES: normal, Conjunctiva are pink and non-injected, sclera clear OROPHARYNX:no exudate, no erythema and lips, buccal mucosa, and tongue normal  NECK: supple, thyroid normal size, non-tender, without nodularity LYMPH:  no palpable lymphadenopathy in the cervical, axillary or inguinal LUNGS: clear to auscultation and percussion with normal breathing effort HEART: regular rate & rhythm and no murmurs and no lower extremity edema ABDOMEN:abdomen soft, non-tender and normal bowel sounds Musculoskeletal:no cyanosis of digits and no clubbing  NEURO: alert & oriented x 3 with fluent speech, no focal motor/sensory deficits   Labs:  Lab Results  Component Value Date   WBC 5.4 03/26/2016   HGB 10.2 (L) 03/26/2016   HCT 29.8 (L) 03/26/2016   MCV 92.0 03/26/2016   PLT 15 (LL) 03/26/2016   NEUTROABS 2.7 03/26/2016    Lab Results  Component Value Date   NA 138 03/26/2016   K 3.2 (L) 03/26/2016   CL 107 03/26/2016   CO2 25 03/26/2016    Assessment & Plan:    Neutropenic fever She presented with neutropenic fever, pancytopenia, hypotensive and tachycardia. Her blood pressure has improved with IV hydration Due to history of congestive heart  failure, I havestoppedfluid hydration. I appreciate pharmacist for antimicrobial coverage. With negative cultures, I have stopped vancomycin and cefepime I have prescribed G-CSF until ANC is greater than 1.5; can stop today So far, urinalysis &chest x-ray were negative for source of infection. Blood culture is negative  Recurrent classical Hodgkin lymphoma Recent PET CT scan showedhyper intense metabolic activity in the bone marrow area  Bone marrow biopsy from 03/10/16 is reviewed with the pathologist which show greater than 50% bone marrow involvement from Hodgkin lymphoma She has received cycle 1 of chemotherapy on 03/13/2016 Continue supportive care It is clear to me that she will no longer be a candidate to receive further chemotherapy  Pancytopenia She has received 2 units of blood transfusion on 03/08/2016 She is profoundly anemic again and she received 2 more units of blood transfusion on 03/22/2016 Continue to monitor platelet count carefully and transfuse if less than 15,000 With her platelet count trending down, she will likely need platelet transfusion tomorrow No need transfusion today  Exarcebation of congestive heart failure with cough and orthopnea, tachycardia Due to signs of hypotension and tachycardia, will hold her diuretic therapy and blood pressure medications Monitor in & out and daily weight She has mild symptoms of recurrent CHF. I have given her intemittent lasix doses Recheck BNP is mildly elevated  EKG looked abnormal I have consulted cardiologist and she is currently at telemetry bed  Elevated liver enzymes and leg swelling CT scan of the abdomen did not reveal the cause of abnormal liver enzymes. Incidentally, there is some mesenteric edema Could be due to volume overload and CHF We'll add TED hose for leg swelling  Poor oral intake, moderate protein calorie malnutrition This is likely due to cancer  relapse Startedremeron  Mucositis Due to chemotherapy. Continue Magic mouthwash  Rectal pain Avoid rectal suppository due to neutropenia Continue proctofoam Avoid Anusol if possible due to recent neutropenic fever  Significant weakness Will consult physical therapy   Severe hypokalemia, improving Likely related to Cefepimeand poor oral intake She has received IV and oral replacement yesterday Will continue replacement therapy & consider stopping antibiotics  DVT prophylaxis Lovenox on hold due to low platelet count  CODE STATUS Full code  Goals of care discussion The patient is aware shehas incurabledisease and treatment is strictly palliative. We discussed importance of Advanced Directives and Living will. We discussed CODE STATUS; the patient desires to remain in full code. She appointed her husband as medical healthcare power of attorney With her progressive decline and recurrent admission to the hospital, I spoke with patient again repeatedly to consider stopping chemotherapy in the future.  Her husband is agreeable for palliative care consult  Diabetes mellitus Her blood sugar is trending up With chronic exposure to steroid therapy, it is likely she is developing diabetes She is started on insulin sliding scale and monitor closely She will benefit from outpatient primary care doctor management for diabetes  Discharge planning Not ready for discharge With the complexity of multitude of medical complications, I have requested hospitalist service to take over her care.  [name], MD 03/26/2016  7:24 PM

## 2016-03-26 NOTE — Consult Note (Signed)
Consultation Note Date: 03/26/2016   Patient Name: Diamond Boyd  DOB: 1940/11/26  MRN: [id number]  Age / Sex: 76 y.o., female  PCP: Abner Greenspan, MD Referring Physician: Donne Hazel, MD  Reason for Consultation: Establishing goals of care and Psychosocial/spiritual support  HPI/Patient Profile: 76 y.o. female  with past medical history of stage 4 Hodgkin's lymphoma, PVD, NICM with P Afib, Colon cancer, CHF (EF 87 - 50%) who was admitted on 03/21/2016 with neutropenic fever, pancytopenia, and weakness.  She has received transfusions and supportive care and her neutropenic fever has resolved.  It has been determined that she is no longer a candidate for chemotherapy.   Clinical Assessment and Goals of Care:   I have reviewed medical records including EPIC notes, labs and imaging, received report from, assessed the patient and then met at the bedside along with Mr. Puller to discuss diagnosis prognosis, GOC, EOL wishes, disposition and options.  Initially we met with the patient's husband alone as Diamond Boyd was sleeping. I introduced Palliative Medicine as specialized medical care for people living with serious illness. It focuses on providing relief from the symptoms and stress of a serious illness. The goal is to improve quality of life for both the patient and the family.  We discussed a brief life review of the patient including the death of her daughter and the recent death of her brother.  Then I assessed functional and nutritional status at home by gathering history. The patient has been living a bed to chair existence and is completely dependent on her husband for ADLs.  She is eating a minimal amount, and mostly subsisting on 1 or 2 ensure drinks per day.  We discussed their current illness and what in means in the larger context of their on-going co-morbidities.  Natural disease trajectory and  expectations at EOL were discussed.  It was determined from Dr. Alvy Bimler that should Diamond Boyd stop transfusion therapy her life expectancy would be less than 2 weeks.  I attempted to elicit values and goals of care important to the patient and her husband.  Her comfort is most important to him.  Time with family is most important to her.    Hospice House services were explained and offered.   Husband and patient are accepting of Hospice house services.  They understand that the goal is a comfortable peaceful passing and that there will be no more blood transfusions or chemotherapy.  Questions and concerns were addressed.    Primary Decision Maker:  Husband and patient    SUMMARY OF RECOMMENDATIONS    [facility]C [facility]   Code Status/Advance Care Planning:  DNR    Symptom Management:   Tucks, Sitz baths, Opioid for hemorrhoidal pain.  Additional Recommendations (Limitations, Scope, Preferences):  Full Comfort Care   Psycho-social/Spiritual:   Desire for further Chaplaincy support: yes [facility]  Prognosis:   < 2 weeks given end stage Hodgkins Lymphoma with no further chemo, blood or platelet transfusions.  Discharge Planning: Hospice  facility      Primary Diagnoses: Present on Admission: . Hodgkin's lymphoma in relapse ([place]) . Pancytopenia, acquired ([place]) . Elevated liver enzymes . Hypoalbuminemia due to protein-calorie malnutrition ([place]) . Immunocompromised status associated with infection ([place]) . Neutropenic fever ([place]) . Atrial fibrillation with rapid ventricular response ([place])   I have reviewed the medical record, interviewed the patient and family, and examined the patient. The following aspects are pertinent.  Past Medical History:  Diagnosis Date  . Anemia    hx blood tx  . Asthma   . Benign essential HTN   . Cancer ([place])    skin cancer- basal cell on arm / colon 2011  . Chronic systolic CHF (congestive heart failure), NYHA  class 2 ([place]) 01/2015  . Colon cancer ([place])    2011, s/p surgery, in remission  . Colon polyps   . Degenerative disk disease    Thoracic spine  . Depression   . 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 Generalized weakness 05/11/2015  . GERD (gastroesophageal reflux disease)   . Heart murmur   . hodgkins lymphoma   . Hypothyroidism   . Lymphoma ([place])   . Neuropathy due to chemotherapeutic drug ([place]) 06/19/2014  . NICM (nonischemic cardiomyopathy) ([place]) 01/2015  . Osteoarthritis    hands  . 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 PAF (paroxysmal atrial fibrillation) ([place])   . Peripheral vascular disease ([place])   . Pneumonia    Social History   Social History  . Marital status: Married    Spouse name: N/A  . Number of children: 2  . Years of education: N/A   Occupational History  . Retired  Retired   Social History Main Topics  . Smoking status: Never Smoker  . Smokeless tobacco: Never Used     Comment: Second-hand exposure through father.  . Alcohol use No  . Drug use: No  . Sexual activity: No   Other Topics Concern  . None   Social History Narrative   Originally from [place]. Always lived in [place]. Previously has traveled to [facility], [place] & up [employer] to [place]. No international travel. No pets currently. Remote parakeet exposure with her children. 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    Lives at home with husband. Weakness due to chemo, but otherwise independant      Family History  Problem Relation Age of Onset  . Coronary artery disease Mother   . Diabetes Mother   . Fibromyalgia Daughter     chronic pain   . COPD Daughter   . Diabetes Brother   . Asthma Daughter   . Rheum arthritis Brother   . Clotting disorder Brother   . Alcohol abuse Sister   . Colon cancer Neg Hx   . Colon polyps Neg Hx   . Stomach cancer Neg Hx   . Rectal cancer Neg Hx   .  Ulcerative colitis Neg Hx   . Crohn's disease Neg Hx    Scheduled Meds: . acyclovir  400 mg Oral Daily  . albuterol  3 mL Inhalation BID  . cholecalciferol  1,000 Units Oral Daily  . docusate sodium  100 mg Oral BID  . estradiol  1 mg Oral Daily  . feeding supplement (ENSURE ENLIVE)  237 mL Oral BID BM  . FLUoxetine  20 mg Oral Daily  . fluticasone  1 spray Each Nare BID  .  fluticasone furoate-vilanterol  1 puff Inhalation QPM  . furosemide  40 mg Intravenous BID  . insulin aspart  0-24 Units Subcutaneous TID WC  . levothyroxine  112 mcg Oral QAC breakfast  . magic mouthwash w/lidocaine  5 mL Oral QID  . metoprolol tartrate  12.5 mg Oral BID  . mirtazapine  15 mg Oral QHS  . potassium chloride  40 mEq Oral BID   Continuous Infusions: PRN Meds:.acetaminophen, benzonatate, HYDROmorphone (DILAUDID) injection, lidocaine-prilocaine, ondansetron **OR** ondansetron **OR** ondansetron (ZOFRAN) IV **OR** ondansetron (ZOFRAN) IV, pramoxine, senna-docusate, sodium chloride flush Allergies  Allergen Reactions  . Achromycin [Tetracycline Hcl] Other (See Comments)    UNSPECIFIED REACTION  Pt does not remember reaction  . Acyclovir And Related   . Allopurinol Other (See Comments)    REACTION: Unsure of reaction happene years ago  . Astelin [Azelastine Hcl] Other (See Comments)    Reaction unknown  . Cephalexin Other (See Comments)    REACTION: unsure of reaction happened yrs ago.  . Codeine Other (See Comments)    REACTION: abd. pain  . Lisinopril Cough  . Meloxicam Other (See Comments)    REACTION: GI symptoms  . Minocycline Other (See Comments)    Abdominal pain  . Nabumetone Other (See Comments)    REACTION: reaction not known  . Nyquil [Pseudoeph-Doxylamine-Dm-Apap] Hives  . Sulfa Antibiotics Other (See Comments)    Gi side eff   . Zolpidem Tartrate Other (See Comments)    REACTION: feels too drugged  . Buspar [Buspirone Hcl] Other (See Comments)    Dizziness, and not as  effective for anxiety  . Ciprofloxacin Rash   Review of Systems patient lethargic  Physical Exam  Well developed female, sleepy but cooperative and pleasant. CV rrr Resp no distress on 2L [place] Abdomen soft  Vital Signs: BP (!) 125/54 (BP Location: Left Arm)   Pulse 89   Temp 99.4 F (37.4 C) (Axillary)   Resp 20   Ht _0  (1.626 m)   Wt 102.9 kg (226 lb 13.7 oz)   LMP 02/10/1970   SpO2 95%   BMI 38.94 kg/m  Pain Assessment: No/denies pain POSS *See Group Information*: 1-Acceptable,Awake and alert Pain Score: 2    SpO2: SpO2: 95 % O2 Device:SpO2: 95 % O2 Flow Rate: .   IO: Intake/output summary:  Intake/Output Summary (Last 24 hours) at 03/26/16 1317 Last data filed at 03/26/16 1005  Gross per 24 hour  Intake              100 ml  Output              200 ml  Net             -100 ml    LBM: Last BM Date: 03/26/16 Baseline Weight: Weight: 98 kg (216 lb) Most recent weight: Weight: 102.9 kg (226 lb 13.7 oz)     Palliative Assessment/Data:   Flowsheet Rows   Flowsheet Row Most Recent Value  Intake Tab  Referral Department  Oncology  Unit at Time of Referral  Cardiac/Telemetry Unit  Palliative Care Primary Diagnosis  Cardiac  Date Notified  03/26/16  Palliative Care Type  New Palliative care  Reason for referral  Clarify Goals of Care  Date of Admission  03/21/16  # of days IP prior to Palliative referral  5  Clinical Assessment  Psychosocial & Spiritual Assessment  Palliative Care Outcomes       Time Total: 70 min. Greater than 50%  of  this time was spent counseling and coordinating care related to the above assessment and plan.  Signed by: Imogene Burn, PA-C Palliative Medicine Pager: [phone]  Please contact Palliative Medicine Team phone at [phone] for questions and concerns.  For individual provider: See Shea Evans

## 2016-03-26 NOTE — Progress Notes (Addendum)
The patient has been seen in conjunction with Reino Bellis, NP-C. All aspects of care have been considered and discussed. The patient has been personally interviewed, examined, and all clinical data has been reviewed.   No significant arrhythmias over the past 24 hours. All alarms and heart rate trend was reviewed personally on the telemetry unit.  Continue low-dose beta blocker therapy.  Suspect acute on chronic combined systolic and diastolic heart failure. Agree with diuresis but we need to measure intake and output or daily weights to avoid volume contraction. Daily kidney function needs to be followed. Admission weight 216 pounds. Today's weight 221 pounds down from 226 pounds yesterday.  Repleting potassium has been started with 80 mEq ordered today.   Progress Note  Patient Name: Diamond Boyd Date of Encounter: 03/26/2016  Primary Cardiologist: Dr. Claiborne Billings  Subjective   Having rectal pain, but just given pain medication. No more palpitations.   Inpatient Medications    Scheduled Meds: . acyclovir  400 mg Oral Daily  . albuterol  3 mL Inhalation BID  . cholecalciferol  1,000 Units Oral Daily  . docusate sodium  100 mg Oral BID  . estradiol  1 mg Oral Daily  . feeding supplement (ENSURE ENLIVE)  237 mL Oral BID BM  . FLUoxetine  20 mg Oral Daily  . fluticasone  1 spray Each Nare BID  . fluticasone furoate-vilanterol  1 puff Inhalation QPM  . furosemide  40 mg Intravenous BID  . insulin aspart  0-24 Units Subcutaneous TID WC  . levothyroxine  112 mcg Oral QAC breakfast  . magic mouthwash w/lidocaine  5 mL Oral QID  . metoprolol tartrate  12.5 mg Oral BID  . mirtazapine  15 mg Oral QHS  . potassium chloride  40 mEq Oral BID  . Tbo-filgastrim (GRANIX) SQ  300 mcg Subcutaneous q1800   Continuous Infusions:  PRN Meds: acetaminophen, HYDROmorphone (DILAUDID) injection, lidocaine-prilocaine, ondansetron **OR** ondansetron **OR** ondansetron (ZOFRAN) IV **OR**  ondansetron (ZOFRAN) IV, pramoxine, senna-docusate   Vital Signs    Vitals:   03/25/16 2132 03/25/16 2230 03/26/16 0611 03/26/16 0615  BP:   (!) 125/54   Pulse:   89   Resp:   20   Temp:  98.2 F (36.8 C) 98.6 F (37 C) 99.4 F (37.4 C)  TempSrc:  Oral Oral Axillary  SpO2: 96%  100%   Weight:      Height:        Intake/Output Summary (Last 24 hours) at 03/26/16 0815 Last data filed at 03/25/16 1300  Gross per 24 hour  Intake              360 ml  Output                0 ml  Net              360 ml   Filed Weights   03/21/16 1456 03/25/16 0513  Weight: 216 lb (98 kg) 226 lb 13.7 oz (102.9 kg)    Telemetry    SR, 1st degree AVB with PVCs - Personally Reviewed  ECG    N/A - Personally Reviewed  Physical Exam   GEN: Pleasant older WF, No acute distress. Tearful. Neck: No JVD Cardiac: RRR, 2/6 systolic murmur, rubs, or gallops.  Respiratory: Clear to auscultation bilaterally. GI: Soft, nontender, non-distended  MS: 2+ bilateral LE edema; No deformity. Neuro:  Nonfocal  Psych: Normal affect   Labs    Chemistry Recent Labs  Lab 03/24/16 0516 03/25/16 0500 03/26/16 0602  NA 136 136 138  K 2.9* 3.3* 3.2*  CL 105 108 107  CO2 '22 23 25  '$ GLUCOSE 336* 208* 168*  BUN '9 8 11  '$ CREATININE 0.84 0.76 0.87  CALCIUM 9.0 9.1 9.8  PROT 5.0* 4.6* 5.1*  ALBUMIN 2.2* 2.1* 2.4*  AST '24 21 25  '$ ALT '22 18 23  '$ ALKPHOS 211* 184* 238*  BILITOT 2.1* 2.1* 2.2*  GFRNONAA >60 >60 >60  GFRAA >60 >60 >60  ANIONGAP '9 5 6     '$ Hematology Recent Labs Lab 03/24/16 0516 03/25/16 0722 03/26/16 0602  WBC 2.9* 3.7* 5.4  RBC 3.16* 3.08* 3.24*  HGB 9.9* 9.7* 10.2*  HCT 28.6* 27.9* 29.8*  MCV 90.5 90.6 92.0  MCH 31.3 31.5 31.5  MCHC 34.6 34.8 34.2  RDW 19.2* 19.6* 20.1*  PLT 16* 15* 15*    Cardiac EnzymesNo results for input(s): TROPONINI in the last 168 hours. No results for input(s): TROPIPOC in the last 168 hours.   BNP Recent Labs Lab 03/22/16 0427 03/25/16 0722    BNP 415.6* 568.2*     DDimer No results for input(s): DDIMER in the last 168 hours.   Radiology    No results found.  Cardiac Studies   Echocardiogram 04/02/15: - Left ventricle: The cavity size was normal. Wall thickness was   normal. Systolic function was mildly reduced. The estimated   ejection fraction was in the range of 45% to 50%. Mild diffuse   hypokinesis with no identifiable regional variations. Features   are consistent with a pseudonormal left ventricular filling   pattern, with concomitant abnormal relaxation and increased   filling pressure (grade 2 diastolic dysfunction). - Mitral valve: There was mild regurgitation. - Left atrium: The atrium was moderately to severely dilated.  Patient Profile     76 y.o. female with PMH of colon CA, Hodgkin's lymphoma (currently undergoing chemo), anemia, PAF, NICM who presented with fever, and generalized weakness. Transferred to telemetry after developing elevated HR, (ST and PSVT).  Assessment & Plan    1. SVT: Reports she developed onset of palpitations on the morning of 03/25/16, and fluttering in her chest. EKG showed ST with PACs and incomplete LBBB. Telemetry showed ST with intermittent episodes of SVT. Given IV lopressor with rate improvement. Restarted in metoprolol 12.'5mg'$  BID -- HR improved, continue BB  2. Hx of PAF: Does not appear to be AF on telemetry at this time. Resume BB. Has tried [facility] in the past with recurrent GI bleeding, also noted to have pancytopenia, therefore not a candidate for [facility] at this time.   3. Chronic combined HF: BNP today 568, up from admission, and noted LE edema. Weight is up 216>>226 -- started IV lasix '40mg'$  BID yesterday, No output recorded, but states she had multiple trips to the bathroom yesterday. Would continue with IV lasix for now. Cr and blood pressure stable.  -- TED hose already ordered  4. Hypoalbuminemia: 2.4, likely contributing to LE edema  5. Pancytopenia: Oncology  following, platelet remains at 15,000    6. Hypokalemia: Replaced, monitor with diuresis.  Signed, Reino Bellis, NP  03/26/2016, 8:15 AM

## 2016-03-27 ENCOUNTER — Inpatient Hospital Stay (HOSPITAL_COMMUNITY): Payer: Medicare Other

## 2016-03-27 DIAGNOSIS — Z515 Encounter for palliative care: Secondary | ICD-10-CM

## 2016-03-27 DIAGNOSIS — I5033 Acute on chronic diastolic (congestive) heart failure: Secondary | ICD-10-CM

## 2016-03-27 DIAGNOSIS — E46 Unspecified protein-calorie malnutrition: Secondary | ICD-10-CM

## 2016-03-27 LAB — BASIC METABOLIC PANEL
ANION GAP: 4 — AB (ref 5–15)
BUN: 14 mg/dL (ref 6–20)
CALCIUM: 9.7 mg/dL (ref 8.9–10.3)
CHLORIDE: 106 mmol/L (ref 101–111)
CO2: 26 mmol/L (ref 22–32)
CREATININE: 0.86 mg/dL (ref 0.44–1.00)
GFR calc non Af Amer: >60 mL/min (ref >60)
Glucose, Bld: 182 mg/dL — ABNORMAL HIGH (ref 65–99)
Potassium: 3.4 mmol/L — ABNORMAL LOW (ref 3.5–5.1)
Sodium: 136 mmol/L (ref 135–145)

## 2016-03-27 LAB — CBC WITH DIFFERENTIAL/PLATELET
BASOS PCT: 0 %
Basophils Absolute: 0 10*3/uL (ref 0.0–0.1)
EOS PCT: 1 %
Eosinophils Absolute: 0.1 10*3/uL (ref 0.0–0.7)
HEMATOCRIT: 25.9 % — AB (ref 36.0–46.0)
HEMOGLOBIN: 8.9 g/dL — AB (ref 12.0–15.0)
LYMPHS ABS: 1.2 10*3/uL (ref 0.7–4.0)
Lymphocytes Relative: 22 %
MCH: 31.4 pg (ref 26.0–34.0)
MCHC: 34.4 g/dL (ref 30.0–36.0)
MCV: 91.5 fL (ref 78.0–100.0)
MONO ABS: 1.1 10*3/uL — AB (ref 0.1–1.0)
MONOS PCT: 20 %
Neutro Abs: 2.9 10*3/uL (ref 1.7–7.7)
Neutrophils Relative %: 57 %
Platelets: 17 10*3/uL — CL (ref 150–400)
RBC: 2.83 MIL/uL — AB (ref 3.87–5.11)
RDW: 20.6 % — AB (ref 11.5–15.5)
WBC: 5.3 10*3/uL (ref 4.0–10.5)

## 2016-03-27 LAB — GLUCOSE, CAPILLARY: Glucose-Capillary: 165 mg/dL — ABNORMAL HIGH (ref 65–99)

## 2016-03-27 LAB — CULTURE, BLOOD (SINGLE)

## 2016-03-27 MED ORDER — GLYCOPYRROLATE 0.2 MG/ML IJ SOLN
0.2000 mg | INTRAMUSCULAR | Status: DC | PRN
  Filled 2016-03-27: qty 1

## 2016-03-27 MED ORDER — DOCUSATE SODIUM 100 MG PO CAPS: 100.0000 mg | ORAL_CAPSULE | Freq: Two times a day (BID) | ORAL | Status: DC | PRN

## 2016-03-27 MED ORDER — WITCH HAZEL-GLYCERIN EX PADS
MEDICATED_PAD | Freq: Four times a day (QID) | CUTANEOUS | Status: DC
  Administered 2016-03-27 (×2): via TOPICAL
  Filled 2016-03-27 (×2): qty 100

## 2016-03-27 MED ORDER — FUROSEMIDE 10 MG/ML IJ SOLN: 60.0000 mg | Freq: Two times a day (BID) | INTRAMUSCULAR | Status: DC

## 2016-03-27 MED ORDER — SODIUM CHLORIDE 0.9 % IV BOLUS (SEPSIS): 250.0000 mL | Freq: Once | INTRAVENOUS | Status: DC

## 2016-03-27 MED ORDER — SODIUM CHLORIDE 0.9% FLUSH: 3.0000 mL | INTRAVENOUS | Status: DC | PRN

## 2016-03-27 MED ORDER — FLUTICASONE PROPIONATE 50 MCG/ACT NA SUSP: 1.0000 | Freq: Two times a day (BID) | NASAL | Status: DC | PRN

## 2016-03-27 MED ORDER — POTASSIUM CHLORIDE CRYS ER 20 MEQ PO TBCR
40.0000 meq | EXTENDED_RELEASE_TABLET | Freq: Two times a day (BID) | ORAL | Status: DC
  Administered 2016-03-27: 40 meq via ORAL
  Filled 2016-03-27: qty 2

## 2016-03-27 MED ORDER — HALOPERIDOL LACTATE 5 MG/ML IJ SOLN: 0.5000 mg | INTRAMUSCULAR | Status: DC | PRN

## 2016-03-27 MED ORDER — ALBUTEROL SULFATE (2.5 MG/3ML) 0.083% IN NEBU: 3.0000 mL | INHALATION_SOLUTION | Freq: Two times a day (BID) | RESPIRATORY_TRACT | Status: DC | PRN

## 2016-03-27 MED ORDER — GLYCOPYRROLATE 1 MG PO TABS
1.0000 mg | ORAL_TABLET | ORAL | Status: DC | PRN
  Filled 2016-03-27: qty 1

## 2016-03-27 MED ORDER — CLONAZEPAM 0.5 MG PO TABS
0.2500 mg | ORAL_TABLET | Freq: Every day | ORAL | Status: DC
  Administered 2016-03-27: 0.25 mg via ORAL
  Filled 2016-03-27: qty 1

## 2016-03-27 MED ORDER — SODIUM CHLORIDE 0.9 % IV SOLN: 250.0000 mL | INTRAVENOUS | Status: DC | PRN

## 2016-03-27 MED ORDER — MORPHINE SULFATE (CONCENTRATE) 10 MG/0.5ML PO SOLN: 5.0000 mg | ORAL | Status: DC | PRN

## 2016-03-27 MED ORDER — BIOTENE DRY MOUTH MT LIQD: 15.0000 mL | OROMUCOSAL | Status: DC | PRN

## 2016-03-27 MED ORDER — FUROSEMIDE 10 MG/ML IJ SOLN
20.0000 mg | Freq: Once | INTRAMUSCULAR | Status: AC
  Administered 2016-03-27: 20 mg via INTRAVENOUS
  Filled 2016-03-27: qty 2

## 2016-03-27 MED ORDER — POLYVINYL ALCOHOL 1.4 % OP SOLN
1.0000 [drp] | Freq: Four times a day (QID) | OPHTHALMIC | Status: DC | PRN
  Filled 2016-03-27: qty 15

## 2016-03-27 MED ORDER — FUROSEMIDE 10 MG/ML IJ SOLN
80.0000 mg | INTRAMUSCULAR | Status: DC
  Administered 2016-03-27: 80 mg via INTRAVENOUS
  Filled 2016-03-27 (×2): qty 8

## 2016-03-27 MED ORDER — SODIUM CHLORIDE 0.9% FLUSH
3.0000 mL | Freq: Two times a day (BID) | INTRAVENOUS | Status: DC
  Administered 2016-03-27 (×2): 3 mL via INTRAVENOUS

## 2016-03-27 MED ORDER — MORPHINE SULFATE (CONCENTRATE) 10 MG/0.5ML PO SOLN
5.0000 mg | ORAL | Status: DC | PRN
  Administered 2016-03-28: 5 mg via SUBLINGUAL
  Filled 2016-03-27: qty 0.5

## 2016-03-27 MED ORDER — HALOPERIDOL LACTATE 2 MG/ML PO CONC
0.5000 mg | ORAL | Status: DC | PRN
  Filled 2016-03-27: qty 0.3

## 2016-03-27 MED ORDER — HALOPERIDOL 0.5 MG PO TABS
0.5000 mg | ORAL_TABLET | ORAL | Status: DC | PRN
  Filled 2016-03-27: qty 1

## 2016-03-27 NOTE — Progress Notes (Signed)
CSW received consult for residential hospice placement. CSW confirmed with patient's husband at bedside that [place] [facility] would be their preference. CSW called & faxed in referral to Neoma Laming at [facility], awaiting response back re: bed availability/eligibility.    Raynaldo Opitz, [facility] Clinical Social Worker cell #: [phone]

## 2016-03-27 NOTE — Progress Notes (Addendum)
The patient has been seen in conjunction with Ricke Hey. All aspects of care have been considered and discussed. The patient has been personally interviewed, examined, and all clinical data has been reviewed.   Additional one-pound diuresis overnight. In a.m. we can switch to oral furosemide 40 mg daily. We'll continue low-dose beta blocker therapy as instituted.  Please call if we can be of further assistance.   Progress Note  Patient Name: Diamond Boyd Date of Encounter: 03/27/2016  Primary Cardiologist: Dr. Claiborne Billings  Subjective   Having a better day, not as much pain.   Inpatient Medications    Scheduled Meds: . acyclovir  400 mg Oral Daily  . albuterol  3 mL Inhalation BID  . cholecalciferol  1,000 Units Oral Daily  . clonazePAM  0.25 mg Oral QHS  . docusate sodium  100 mg Oral BID  . estradiol  1 mg Oral Daily  . feeding supplement (ENSURE ENLIVE)  237 mL Oral BID BM  . FLUoxetine  20 mg Oral Daily  . fluticasone  1 spray Each Nare BID  . fluticasone furoate-vilanterol  1 puff Inhalation QPM  . furosemide  80 mg Intravenous 2 times per day  . insulin aspart  0-24 Units Subcutaneous TID WC  . levothyroxine  112 mcg Oral QAC breakfast  . magic mouthwash w/lidocaine  5 mL Oral QID  . metoprolol tartrate  12.5 mg Oral BID  . mirtazapine  15 mg Oral QHS  . potassium chloride  40 mEq Oral BID  . witch hazel-glycerin   Topical QID   Continuous Infusions:  PRN Meds: acetaminophen, benzonatate, HYDROmorphone (DILAUDID) injection, lidocaine-prilocaine, ondansetron **OR** ondansetron **OR** ondansetron (ZOFRAN) IV **OR** ondansetron (ZOFRAN) IV, pramoxine, senna-docusate, sodium chloride flush   Vital Signs    Vitals:   03/26/16 1944 03/26/16 1958 03/27/16 0413 03/27/16 0817  BP:  (!) 137/45 (!) 127/50   Pulse: 85 99 92 94  Resp: '18 18 19 16  '$ Temp:  98.4 F (36.9 C) 98 F (36.7 C)   TempSrc:  Oral Oral   SpO2: 95% 94% 96% (!) 85%  Weight:   220 lb  10.9 oz (100.1 kg)   Height:        Intake/Output Summary (Last 24 hours) at 03/27/16 1020 Last data filed at 03/27/16 0900  Gross per 24 hour  Intake              480 ml  Output                0 ml  Net              480 ml   Filed Weights   03/25/16 0513 03/26/16 1504 03/27/16 0413  Weight: 226 lb 13.7 oz (102.9 kg) 221 lb 6.4 oz (100.4 kg) 220 lb 10.9 oz (100.1 kg)    Telemetry    SR, 1st degree AVB with PVCs, possible episode of AF last evening - Personally Reviewed  ECG    N/A - Personally Reviewed  Physical Exam   GEN: Pleasant older WF, No acute distress. Neck: No JVD Cardiac: RRR, 2/6 systolic murmur, rubs, or gallops.  Respiratory: Clear to auscultation bilaterally. Faint crackles on left.  GI: Soft, nontender, non-distended  MS: 2+ bilateral LE edema; No deformity. Neuro:  Nonfocal  Psych: Normal affect   Labs    Chemistry  Recent Labs Lab 03/24/16 0516 03/25/16 0500 03/26/16 0602 03/27/16 0422  NA 136 136 138 136  K 2.9* 3.3* 3.2*  3.4*  CL 105 108 107 106  CO2 '22 23 25 26  '$ GLUCOSE 336* 208* 168* 182*  BUN '9 8 11 14  '$ CREATININE 0.84 0.76 0.87 0.86  CALCIUM 9.0 9.1 9.8 9.7  PROT 5.0* 4.6* 5.1*  --   ALBUMIN 2.2* 2.1* 2.4*  --   AST '24 21 25  '$ --   ALT '22 18 23  '$ --   ALKPHOS 211* 184* 238*  --   BILITOT 2.1* 2.1* 2.2*  --   GFRNONAA >60 >60 >60 >60  GFRAA >60 >60 >60 >60  ANIONGAP '9 5 6 '$ 4*     Hematology  Recent Labs Lab 03/25/16 0722 03/26/16 0602 03/27/16 0422  WBC 3.7* 5.4 5.3  RBC 3.08* 3.24* 2.83*  HGB 9.7* 10.2* 8.9*  HCT 27.9* 29.8* 25.9*  MCV 90.6 92.0 91.5  MCH 31.5 31.5 31.4  MCHC 34.8 34.2 34.4  RDW 19.6* 20.1* 20.6*  PLT 15* 15* 17*    Cardiac EnzymesNo results for input(s): TROPONINI in the last 168 hours. No results for input(s): TROPIPOC in the last 168 hours.   BNP  Recent Labs Lab 03/22/16 0427 03/25/16 0722  BNP 415.6* 568.2*     DDimer No results for input(s): DDIMER in the last 168 hours.    Radiology    Dg Chest Port 1 View  Result Date: 03/27/2016 CLINICAL DATA:  Shortness of breath, cough, and fever last night and today. History of asthma, CHF, hypertension, Hodgkin's lymphoma lymphoma, and colon malignancy EXAM: PORTABLE CHEST 1 VIEW COMPARISON:  PA and lateral chest x-ray of March 21, 2016 FINDINGS: The lungs are adequately inflated. The interstitial markings are mildly increased. The cardiac silhouette is enlarged. The central pulmonary vascularity is prominent. There is no pleural effusion. There is calcification in the wall of the aortic arch. The power port catheter tip projects over the midportion of the SVC. The trachea is midline. The bony thorax exhibits no acute abnormality. IMPRESSION: Increased interstitial markings likely secondary to low-grade CHF. No alveolar pneumonia. Underlying reactive airway disease. Thoracic aortic atherosclerosis. Electronically Signed   By: David  Martinique M.D.   On: 03/27/2016 09:14    Cardiac Studies   Echocardiogram 04/02/15: - Left ventricle: The cavity size was normal. Wall thickness was   normal. Systolic function was mildly reduced. The estimated   ejection fraction was in the range of 45% to 50%. Mild diffuse   hypokinesis with no identifiable regional variations. Features   are consistent with a pseudonormal left ventricular filling   pattern, with concomitant abnormal relaxation and increased   filling pressure (grade 2 diastolic dysfunction). - Mitral valve: There was mild regurgitation. - Left atrium: The atrium was moderately to severely dilated.  Patient Profile     76 y.o. female with PMH of colon CA, Hodgkin's lymphoma (currently undergoing chemo), anemia, PAF, NICM who presented with fever, and generalized weakness. Transferred to telemetry after developing elevated HR, (ST and PSVT).  Assessment & Plan    1. SVT: Reports she developed onset of palpitations on the morning of 03/25/16, and fluttering in her chest.  EKG showed ST with PACs and incomplete LBBB. Telemetry showed ST with intermittent episodes of SVT. Given IV lopressor with rate improvement. Restarted in metoprolol 12.'5mg'$  BID -- HR improved, continue BB  2. Hx of PAF: Continue BB --Has tried [place] in the past with recurrent GI bleeding, also noted to have pancytopenia, therefore not a candidate for [place] at this time.   3. Acute on chronic combined  systolic and diastolic HF:  BNP 838, up from admission, and noted LE edema. Edema is related to some component of heart failure and hypoalbuminemia/low oncotic pressure. Plan to continue diuretic therapy. In a.m. we can switch to oral therapy, 40 mg daily.  4. Hypoalbuminemia: 2.4 yesterday, likely contributing to LE edema. Poor PO intake.  5. Pancytopenia/Lymphoma: Oncology following, platelet 17,000. Considering palliative care options.   6. Hypokalemia: Replaced, monitor with diuresis.  Signed, Reino Bellis, NP  03/27/2016, 10:20 AM

## 2016-03-27 NOTE — Progress Notes (Signed)
Nutrition Follow-up  DOCUMENTATION CODES:   Obesity unspecified  INTERVENTION:  - Continue Ensure Enlive BID. - Continue Magic Cup TID.  - Continue PO intakes of meals and supplements as tolerated and/or desired by pt.  NUTRITION DIAGNOSIS:   Inadequate oral intake related to poor appetite, other (see comment) (mucositis) as evidenced by per patient/family report. -ongoing.  GOAL:   Patient will meet greater than or equal to 90% of their needs -unmet.   MONITOR:   PO intake, Supplement acceptance, Labs, Weight trends, I & O's  ASSESSMENT:   76 yo F with a chief complaint of painful hemorrhoids. Going on for the past 3 weeks. Mildly worsening today. Said she had some trace blood on the toilet paper when she wiped. This going through chemotherapy for Hodgkin's lymphoma. Is been noted to be neutropenic, as well as required multiple blood and platelet transfusions. Discussed the hospital yesterday. Was started on a rectal foam for her hemorrhoids.  2/15 Pt ate 50% breakfast and 75% lunch and dinner 2/13, 30% of breakfast and 50% of lunch 2/14, and 25% of breakfast this AM. Pt has been receiving Ensure supplements at all ordered times since 2/13; 2/9-2/12 she had refused at all ordered times. Pt discussed during rounds today and RN reported pt's status is declining and MD reported that Dr. Alvy Bimler has recommended comfort care for pt. Reviewed Case Manager note that consult to that service received for residential hospice placement.   Medications reviewed; 20 mg IV Lasix x1 dose today, 80 mg IV Lasix BID, 40 mEq oral KCl BID. Labs reviewed; CBG: 165 mg/dL today, K: 3.4 mmol/L, Alk Phos elevated.     2/11 - Patient in room with husband at bedside.  - Pt reports since last nutrition assessment (2/1), her  appetite has decreased and she has developed mucositis.  - She has noticed some changes in taste and she is getting full quickly.  - Pt states she is currently craving some green  peppers.  - Encouraged pt's family to provide foods that pt is craving, pt on regular diet.  - Pt currently eating 1/2 cup of pudding.  - Pt has not been drinking Ensure supplements; willing to try ice cream with meals, will order Magic cups.   Per chart review, pt has lost 8 lb since 12/20 (4% wt loss x 1.5 months, insignificant for time frame). Nutrition focused physical exam shows no sign of depletion of muscle mass or body fat.   Diet Order:  Diet regular Room service appropriate? Yes; Fluid consistency: Thin  Skin:  Reviewed, no issues  Last BM:  2/14  Height:   Ht Readings from Last 1 Encounters:  03/21/16 5' 4"  (1.626 m)    Weight:   Wt Readings from Last 1 Encounters:  03/27/16 220 lb 10.9 oz (100.1 kg)    Ideal Body Weight:  54.5 kg  BMI:  Body mass index is 37.88 kg/m.  Estimated Nutritional Needs:   Kcal:  1950-2150  Protein:  85-95g  Fluid:  1.9-2.1L/day  EDUCATION NEEDS:   No education needs identified at this time    Jarome Matin, MS, RD, LDN, CNSC Inpatient Clinical Dietitian Pager # [phone] After hours/weekend pager # [phone]

## 2016-03-27 NOTE — Progress Notes (Signed)
PROGRESS NOTE    Diamond Boyd  MHD:[id number] DOB: 11-23-1940 DOA: 03/21/2016 PCP: [name], MD    Brief Narrative:  76yo who initially presented to the Oncology service on 2/9 for management of neutropenic fever following recent hospital discharge. Patient was noted to be hypotensive and tachycardic. Patient was started on empiric broad spectrum abx  And required 2 units of PRBC's. During work up, patient was given daily G-CSF for neutropenia. Patient developed signs of acute CHF exacerbation. Cardiology was consulted and patient transferred to hospitalist service.  Assessment & Plan:   Principal Problem:   Neutropenic fever ([place]) Active Problems:   Hodgkin's lymphoma in relapse (HCC)   Elevated liver enzymes   Immunocompromised status associated with infection (HCC)   Atrial fibrillation with rapid ventricular response (HCC)   Hypoalbuminemia due to protein-calorie malnutrition (HCC)   Pancytopenia, acquired ([place])   Hemorrhoids   Hypokalemia   Palliative care encounter   Comfort measures only status   1. Neutropenic fever 1. No longer neutropenic 2. Presently afebrile 3. Stopped G-CSF 4. Off empiric abx. No source of infection identified 2. Recurrent Hodgkin lymphoma 1. Oncology following 2. Pt is s/p chemo on 2/1 3. Discussed case with Dr. Alvy Bimler this AM over phone. Concerns that patient may not be a candidate for further chemo in the future. 4. Appreciate input by Palliative Care. Plan for full comfort with residential hospice 3. Pancytopenia 1. Remains thrombocytopenic 2. Stable 4. Acutely decompensated systolic CHF exacerbation 1. Tolerating lasix 2. Cardiology had been following 3. Most recent EF of 45-50% per 2/17 2d echo 4. Plan transition to PO lasix at time of discharge 5. Elevated LFT's 1. Stable 6. Mod protein calorie malnutrition 1. Encourage PO as tolerated 2. Currently stable 7. Hypokalemia 1. Was replaced 2. No further blood draws given  comfort measures 8. Hypomagnesemia 1. corrected 9. Weakness 1. Stable 10. End of Life 1. Appreciate input by Palliative Care 2. Pt and family have decided on full comfort with plan of residential hospice 3. Foley cath placed for comfort  DVT prophylaxis: TED hose Code Status: Full Family Communication: Pt in room, family at bedside Disposition Plan: Residential hospice 2/16  Consultants:   Oncology  Palliative Care  Cardiology  Procedures:     Antimicrobials: Anti-infectives    Start     Dose/Rate Route Frequency Ordered Stop   03/24/16 0600  vancomycin (VANCOCIN) 1,500 mg in sodium chloride 0.9 % 500 mL IVPB  Status:  Discontinued     1,500 mg 250 mL/hr over 120 Minutes Intravenous Every 24 hours 03/23/16 2004 03/24/16 0525   03/22/16 1000  acyclovir (ZOVIRAX) tablet 400 mg     400 mg Oral Daily 03/21/16 1458     03/22/16 0600  vancomycin (VANCOCIN) 1,250 mg in sodium chloride 0.9 % 250 mL IVPB  Status:  Discontinued     1,250 mg 166.7 mL/hr over 90 Minutes Intravenous Every 12 hours 03/21/16 2001 03/23/16 2004   03/21/16 2200  ceFEPIme (MAXIPIME) 2 g in dextrose 5 % 50 mL IVPB  Status:  Discontinued     2 g 100 mL/hr over 30 Minutes Intravenous Every 8 hours 03/21/16 2001 03/25/16 0841   03/21/16 1700  vancomycin (VANCOCIN) 1,500 mg in sodium chloride 0.9 % 500 mL IVPB     1,500 mg 250 mL/hr over 120 Minutes Intravenous  Once 03/21/16 1521 03/21/16 1803   03/21/16 1600  vancomycin (VANCOCIN) 1,500 mg in sodium chloride 0.9 % 500 mL IVPB  Status:  Discontinued     1,500 mg 250 mL/hr over 120 Minutes Intravenous  Once 03/21/16 1518 03/21/16 1521   03/21/16 1600  ceFEPIme (MAXIPIME) 2 g in dextrose 5 % 50 mL IVPB     2 g 100 mL/hr over 30 Minutes Intravenous  Once 03/21/16 1520 03/21/16 1633      Subjective: More sob this AM  Objective: Vitals:   03/26/16 1958 03/27/16 0413 03/27/16 0817 03/27/16 1342  BP: (!) 137/45 (!) 127/50  98/84  Pulse: 99 92 94 89    Resp: '18 19 16 16  '$ Temp: 98.4 F (36.9 C) 98 F (36.7 C)  98.4 F (36.9 C)  TempSrc: Oral Oral  Oral  SpO2: 94% 96% (!) 85% 93%  Weight:  100.1 kg (220 lb 10.9 oz)    Height:        Intake/Output Summary (Last 24 hours) at 03/27/16 1754 Last data filed at 03/27/16 1657  Gross per 24 hour  Intake              120 ml  Output             1900 ml  Net            -1780 ml   Filed Weights   03/25/16 0513 03/26/16 1504 03/27/16 0413  Weight: 102.9 kg (226 lb 13.7 oz) 100.4 kg (221 lb 6.4 oz) 100.1 kg (220 lb 10.9 oz)    Examination:  General exam: Sitting in chair, in nad  Respiratory system: normal resp effort, no wheezing Cardiovascular system: regular rate, s1-2 Gastrointestinal system: soft, nondistended, pos BS Central nervous system: no seizures, no tremors. Extremities: perfused, no clubbing Skin: no pallor, normal skin turgor Psychiatry: mood normal// no visual hallucinations.   Data Reviewed: I have personally reviewed following labs and imaging studies  CBC:  Recent Labs Lab 03/23/16 0536 03/24/16 0516 03/25/16 0722 03/26/16 0602 03/27/16 0422  WBC 2.6* 2.9* 3.7* 5.4 5.3  NEUTROABS 1.0* 1.2* 1.3* 2.7 2.9  HGB 10.7* 9.9* 9.7* 10.2* 8.9*  HCT 31.7* 28.6* 27.9* 29.8* 25.9*  MCV 91.9 90.5 90.6 92.0 91.5  PLT 18* 16* 15* 15* 17*   Basic Metabolic Panel:  Recent Labs Lab 03/23/16 0536 03/24/16 0516 03/25/16 0500 03/26/16 0602 03/27/16 0422  NA 138 136 136 138 136  K 3.2* 2.9* 3.3* 3.2* 3.4*  CL 107 105 108 107 106  CO2 '22 22 23 25 26  '$ GLUCOSE 293* 336* 208* 168* 182*  BUN '7 9 8 11 14  '$ CREATININE 0.81 0.84 0.76 0.87 0.86  CALCIUM 8.7* 9.0 9.1 9.8 9.7  MG  --   --   --  1.8  --    GFR: Estimated Creatinine Clearance: 64 mL/min (by C-G formula based on SCr of 0.86 mg/dL). Liver Function Tests:  Recent Labs Lab 03/22/16 0427 03/23/16 0536 03/24/16 0516 03/25/16 0500 03/26/16 0602  AST '25 29 24 21 25  '$ ALT '22 23 22 18 23  '$ ALKPHOS 215* 244*  211* 184* 238*  BILITOT 1.6* 2.0* 2.1* 2.1* 2.2*  PROT 4.6* 5.4* 5.0* 4.6* 5.1*  ALBUMIN 2.2* 2.7* 2.2* 2.1* 2.4*   No results for input(s): LIPASE, AMYLASE in the last 168 hours. No results for input(s): AMMONIA in the last 168 hours. Coagulation Profile: No results for input(s): INR, PROTIME in the last 168 hours. Cardiac Enzymes: No results for input(s): CKTOTAL, CKMB, CKMBINDEX, TROPONINI in the last 168 hours. BNP (last 3 results) No results for input(s): PROBNP in the last  8760 hours. HbA1C:  Recent Labs  03/25/16 0721  HGBA1C 7.2*   CBG:  Recent Labs Lab 03/26/16 0744 03/26/16 1146 03/26/16 1745 03/26/16 2048 03/27/16 0723  GLUCAP 155* 239* 247* 201* 165*   Lipid Profile: No results for input(s): CHOL, HDL, LDLCALC, TRIG, CHOLHDL, LDLDIRECT in the last 72 hours. Thyroid Function Tests: No results for input(s): TSH, T4TOTAL, FREET4, T3FREE, THYROIDAB in the last 72 hours. Anemia Panel: No results for input(s): VITAMINB12, FOLATE, FERRITIN, TIBC, IRON, RETICCTPCT in the last 72 hours. Sepsis Labs: No results for input(s): PROCALCITON, LATICACIDVEN in the last 168 hours.  Recent Results (from the past 240 hour(s))  Culture, blood (single) w Reflex to ID Panel     Status: None   Collection Time: 03/21/16  1:48 PM  Result Value Ref Range Status   BLOOD CULTURE, ROUTINE Final report  Final   RESULT 1 Comment  Final    Comment: No aerobic or anaerobic growth in five days.     Radiology Studies: Dg Chest Port 1 View  Result Date: 03/27/2016 CLINICAL DATA:  Shortness of breath, cough, and fever last night and today. History of asthma, CHF, hypertension, Hodgkin's lymphoma lymphoma, and colon malignancy EXAM: PORTABLE CHEST 1 VIEW COMPARISON:  PA and lateral chest x-ray of March 21, 2016 FINDINGS: The lungs are adequately inflated. The interstitial markings are mildly increased. The cardiac silhouette is enlarged. The central pulmonary vascularity is prominent.  There is no pleural effusion. There is calcification in the wall of the aortic arch. The power port catheter tip projects over the midportion of the SVC. The trachea is midline. The bony thorax exhibits no acute abnormality. IMPRESSION: Increased interstitial markings likely secondary to low-grade CHF. No alveolar pneumonia. Underlying reactive airway disease. Thoracic aortic atherosclerosis. Electronically Signed   By: David  Martinique M.D.   On: 03/27/2016 09:14    Scheduled Meds: . acyclovir  400 mg Oral Daily  . clonazePAM  0.25 mg Oral QHS  . feeding supplement (ENSURE ENLIVE)  237 mL Oral BID BM  . FLUoxetine  20 mg Oral Daily  . fluticasone furoate-vilanterol  1 puff Inhalation QPM  . furosemide  80 mg Intravenous 2 times per day  . magic mouthwash w/lidocaine  5 mL Oral QID  . metoprolol tartrate  12.5 mg Oral BID  . mirtazapine  15 mg Oral QHS  . sodium chloride flush  3 mL Intravenous Q12H  . witch hazel-glycerin   Topical QID   Continuous Infusions:   LOS: 6 days   CHIU, Orpah Melter, MD [facility] Pager [phone]  If 7PM-7AM, please contact night-coverage [email or web address] Password [facility] 03/27/2016, 5:54 PM

## 2016-03-27 NOTE — Progress Notes (Signed)
Diamond Boyd   DOB:Apr 02, 1940   UG#:[id number]    Subjective: The patient is sleeping. According to her husband, she has portal appetite, complaining of severe bilateral leg edema, and continues to suffer from hemorrhoidal pain. There is no reported recent bleeding.  There is no report of fever or chills.  Objective:  Vitals:   03/26/16 1958 03/27/16 0413  BP: (!) 137/45 (!) 127/50  Pulse: 99 92  Resp: 18 19  Temp: 98.4 F (36.9 C) 98 F (36.7 C)     Intake/Output Summary (Last 24 hours) at 03/27/16 0752 Last data filed at 03/26/16 1724  Gross per 24 hour  Intake              460 ml  Output              200 ml  Net              260 ml    GENERAL: Sleeping, appears comfortable Labs:  Lab Results  Component Value Date   WBC 5.3 03/27/2016   HGB 8.9 (L) 03/27/2016   HCT 25.9 (L) 03/27/2016   MCV 91.5 03/27/2016   PLT 17 (LL) 03/27/2016   NEUTROABS 2.9 03/27/2016    Lab Results  Component Value Date   NA 136 03/27/2016   K 3.4 (L) 03/27/2016   CL 106 03/27/2016   CO2 26 03/27/2016    Assessment & Plan:   Recurrent lymphoma, recent CHF exacerbation, pancytopenia, recent neutropenic fever, severe deconditioning  I spoke with the husband again. He agrees she currently has no quality of life. I had extensive discussion with palliative care team yesterday. I recommend transitioning her care to comfort measures. I will continue to check on her again tomorrow She does not need transfusion support today  [name], MD 03/27/2016  7:52 AM

## 2016-03-27 NOTE — Progress Notes (Signed)
Patient on RA when RT came to administer breathing tx. Patient found to be 84-86% on RA. Patient given both scheduled tx and placed on 2 L [place] per low O2 sats. RN made aware of RT finding. RT will continue to monitor patient.

## 2016-03-28 ENCOUNTER — Other Ambulatory Visit: Payer: Medicare Other

## 2016-03-28 MED ORDER — HEPARIN SOD (PORK) LOCK FLUSH 100 UNIT/ML IV SOLN
500.0000 [IU] | INTRAVENOUS | Status: AC | PRN
  Administered 2016-03-28: 500 [IU]

## 2016-03-28 MED ORDER — GLYCOPYRROLATE 0.2 MG/ML IJ SOLN: 0.2000 mg | INTRAMUSCULAR | Status: AC | PRN

## 2016-03-28 MED ORDER — GLYCOPYRROLATE 1 MG PO TABS: 1.0000 mg | ORAL_TABLET | ORAL | Status: AC | PRN

## 2016-03-28 MED ORDER — MORPHINE SULFATE (CONCENTRATE) 10 MG/0.5ML PO SOLN: 5.0000 mg | ORAL | Status: AC | PRN

## 2016-03-28 MED ORDER — SENNOSIDES-DOCUSATE SODIUM 8.6-50 MG PO TABS: 1.0000 | ORAL_TABLET | Freq: Every evening | ORAL | Status: AC | PRN

## 2016-03-28 MED ORDER — MIRTAZAPINE 15 MG PO TABS: 15.0000 mg | ORAL_TABLET | Freq: Every day | ORAL | Status: AC

## 2016-03-28 MED ORDER — HALOPERIDOL 0.5 MG PO TABS: 0.5000 mg | ORAL_TABLET | ORAL | Status: AC | PRN

## 2016-03-28 MED ORDER — BENZONATATE 100 MG PO CAPS: 100.0000 mg | ORAL_CAPSULE | Freq: Three times a day (TID) | ORAL | 0 refills | Status: AC | PRN

## 2016-03-28 MED ORDER — SODIUM CHLORIDE 0.9% FLUSH
10.0000 mL | INTRAVENOUS | Status: DC | PRN
  Administered 2016-03-28: 10 mL
  Filled 2016-03-28: qty 40

## 2016-03-28 MED ORDER — POLYVINYL ALCOHOL 1.4 % OP SOLN: 1.0000 [drp] | Freq: Four times a day (QID) | OPHTHALMIC | 0 refills | Status: AC | PRN

## 2016-03-28 MED ORDER — ONDANSETRON 4 MG PO TBDP: 4.0000 mg | ORAL_TABLET | Freq: Three times a day (TID) | ORAL | 0 refills | Status: AC | PRN

## 2016-03-28 MED ORDER — DOCUSATE SODIUM 100 MG PO CAPS: 100.0000 mg | ORAL_CAPSULE | Freq: Two times a day (BID) | ORAL | 0 refills | Status: AC | PRN

## 2016-03-28 MED ORDER — CLONAZEPAM 0.5 MG PO TABS: 0.2500 mg | ORAL_TABLET | Freq: Every day | ORAL | 0 refills | Status: AC

## 2016-03-28 MED ORDER — MAGNESIUM CITRATE PO SOLN
1.0000 | Freq: Once | ORAL | Status: DC
  Filled 2016-03-28: qty 296

## 2016-03-28 MED ORDER — FUROSEMIDE 40 MG PO TABS: 40.0000 mg | ORAL_TABLET | Freq: Every day | ORAL | Status: AC

## 2016-03-28 NOTE — Clinical Social Work Note (Signed)
Medical Social Worker facilitated patient discharge including contacting patient family and facility to confirm patient discharge plans.  Clinical information faxed to facility and family agreeable with plan.  MSW arranged ambulance transport via [place] to [facility] [place].  RN to call report prior to discharge.  Medical Social Worker will sign off for now as social work intervention is no longer needed. Please consult [place] again if new need arises.  Glendon Axe, MSW [phone] 03/28/2016 10:14 AM

## 2016-03-28 NOTE — Progress Notes (Signed)
Diamond Boyd   DOB:11-Jun-1940   QJ#:[id number]    Subjective: The patient is alert.  She continues to have hemorrhoidal pain.  No fevers.The patient denies any recent signs or symptoms of bleeding such as spontaneous epistaxis, hematuria or hematochezia. Awaiting transfer to residential hospice facility  Objective:  Vitals:   03/27/16 2011 03/28/16 0523  BP: (!) 123/41 (!) 117/51  Pulse: 94 92  Resp: 15 17  Temp: 98.4 F (36.9 C) 97.9 F (36.6 C)     Intake/Output Summary (Last 24 hours) at 03/28/16 4481 Last data filed at 03/28/16 0600  Gross per 24 hour  Intake              120 ml  Output             2600 ml  Net            -2480 ml    GENERAL:alert, no distress and comfortable SKIN: skin color, texture, turgor are normal, no rashes or significant lesions NEURO: alert & oriented x 3 with fluent speech, no focal motor/sensory deficits   Labs:  Lab Results  Component Value Date   WBC 5.3 03/27/2016   HGB 8.9 (L) 03/27/2016   HCT 25.9 (L) 03/27/2016   MCV 91.5 03/27/2016   PLT 17 (LL) 03/27/2016   NEUTROABS 2.9 03/27/2016    Lab Results  Component Value Date   NA 136 03/27/2016   K 3.4 (L) 03/27/2016   CL 106 03/27/2016   CO2 26 03/27/2016    Studies:  Dg Chest Port 1 View  Result Date: 03/27/2016 CLINICAL DATA:  Shortness of breath, cough, and fever last night and today. History of asthma, CHF, hypertension, Hodgkin's lymphoma lymphoma, and colon malignancy EXAM: PORTABLE CHEST 1 VIEW COMPARISON:  PA and lateral chest x-ray of March 21, 2016 FINDINGS: The lungs are adequately inflated. The interstitial markings are mildly increased. The cardiac silhouette is enlarged. The central pulmonary vascularity is prominent. There is no pleural effusion. There is calcification in the wall of the aortic arch. The power port catheter tip projects over the midportion of the SVC. The trachea is midline. The bony thorax exhibits no acute abnormality. IMPRESSION: Increased  interstitial markings likely secondary to low-grade CHF. No alveolar pneumonia. Underlying reactive airway disease. Thoracic aortic atherosclerosis. Electronically Signed   By: David  Martinique M.D.   On: 03/27/2016 09:14    Assessment & Plan:   Recurrent lymphoma, recent CHF exacerbation, pancytopenia, recent neutropenic fever, severe deconditioning  Appreciated palliative care consult. The patient understood the rationale of transitioning her care to full comfort measures only. Without transfusion support, estimated survival will be less than 2 weeks. We shared our religious belief together and the patient is ready to be transitioned to hospice care. I will sign off.  I addressed all questions and concerns  .[name], MD 03/28/2016  8:26 AM

## 2016-03-28 NOTE — Discharge Summary (Addendum)
Physician Discharge Summary  Diamond Boyd [id number] DOB: 1940/05/10 DOA: 03/21/2016  PCP: [name], MD  Admit date: 03/21/2016 Discharge date: 03/28/2016  Admitted From: Home Disposition:  Residential Hospice  Recommendations for Outpatient Follow-up:  1. Follow up with PCP as needed 2. Please ensure regular bowel movements. Patient my require multiple cathartics  Discharge Condition:Stable CODE STATUS:DNR/DNI Diet recommendation: Regular   Brief/Interim Summary: 76yo who initially presented to the Oncology service on 2/9 for management of neutropenic fever following recent hospital discharge. Patient was noted to be hypotensive and tachycardic. Patient was started on empiric broad spectrum abx  And required 2 units of PRBC's. During work up, patient was given daily G-CSF for neutropenia. Patient developed signs of acute CHF exacerbation. Cardiology was consulted and patient transferred to hospitalist service.  1. Neutropenic fever 1. No longer neutropenic 2. Presently afebrile 3. Stopped G-CSF 4. Off empiric abx. No source of infection identified 2. Recurrent Hodgkin lymphoma 1. Oncology following 2. Pt is s/p chemo on 2/1 3. Discussed case with Dr. Alvy Bimler this AM over phone. Concerns that patient may not be a candidate for further chemo in the future. 4. Appreciate input by Palliative Care. Plan for full comfort with residential hospice 3. Pancytopenia 1. Remains thrombocytopenic 2. Stable 4. Acutely decompensated systolic CHF exacerbation 1. Tolerating lasix 2. Cardiology had been following 3. Most recent EF of 45-50% per 2/17 2d echo 4. Plan transition to PO lasix at time of discharge 5. Elevated LFT's 1. Stable 6. Mod protein calorie malnutrition 1. Encourage PO as tolerated 2. Currently stable 7. Hypokalemia 1. Was replaced 2. No further blood draws given comfort measures 8. Hypomagnesemia 1. corrected 9. Weakness 1. Stable 10. End of  Life 1. Appreciate input by Palliative Care 2. Pt and family have decided on full comfort with plan of residential hospice 3. Foley cath placed for comfort  Discharge Diagnoses:  Principal Problem:   Neutropenic fever ([place]) Active Problems:   Hodgkin's lymphoma in relapse ([place])   Elevated liver enzymes   Immunocompromised status associated with infection ([facility])   Atrial fibrillation with rapid ventricular response ([facility])   Hypoalbuminemia due to protein-calorie malnutrition ([facility])   Pancytopenia, acquired ([place])   Hemorrhoids   Hypokalemia   Palliative care encounter   Comfort measures only status    Discharge Instructions   Allergies as of 03/28/2016      Reactions   Achromycin [tetracycline Hcl] Other (See Comments)   UNSPECIFIED REACTION  Pt does not remember reaction   Acyclovir And Related    Allopurinol Other (See Comments)   REACTION: Unsure of reaction happene years ago   Astelin [azelastine Hcl] Other (See Comments)   Reaction unknown   Cephalexin Other (See Comments)   REACTION: unsure of reaction happened yrs ago.   Codeine Other (See Comments)   REACTION: abd. pain   Lisinopril Cough   Meloxicam Other (See Comments)   REACTION: GI symptoms   Minocycline Other (See Comments)   Abdominal pain   Nabumetone Other (See Comments)   REACTION: reaction not known   Nyquil [pseudoeph-doxylamine-dm-apap] Hives   Sulfa Antibiotics Other (See Comments)   Gi side eff    Zolpidem Tartrate Other (See Comments)   REACTION: feels too drugged   Buspar [buspirone Hcl] Other (See Comments)   Dizziness, and not as effective for anxiety   Ciprofloxacin Rash      Medication List    STOP taking these medications   CALCIUM 600+D 600-400 MG-UNIT tablet Generic drug:  Calcium Carbonate-Vitamin D   estradiol 1 MG tablet Commonly known as:  ESTRACE   fexofenadine 180 MG tablet Commonly known as:  ALLEGRA   gabapentin 300 MG capsule Commonly known as:  NEURONTIN    MULTIVITAMIN ADULTS PO   Omega-3 350 MG Caps   omeprazole 40 MG capsule Commonly known as:  PRILOSEC   ondansetron 8 MG tablet Commonly known as:  ZOFRAN   PROBIOTIC ACIDOPHILUS Caps   simvastatin 40 MG tablet Commonly known as:  ZOCOR   Vitamin D-3 1000 units Caps     TAKE these medications   acetaminophen 650 MG CR tablet Commonly known as:  TYLENOL Take 650 mg by mouth every 8 (eight) hours as needed for pain or fever.   acyclovir 400 MG tablet Commonly known as:  ZOVIRAX Take 1 tablet (400 mg total) by mouth daily. What changed:  when to take this   benzonatate 100 MG capsule Commonly known as:  TESSALON Take 1 capsule (100 mg total) by mouth 3 (three) times daily as needed for cough.   BREO ELLIPTA 200-25 MCG/INH Aepb Generic drug:  fluticasone furoate-vilanterol Inhale 1 puff into the lungs every evening.   clonazePAM 0.5 MG tablet Commonly known as:  KLONOPIN Take 0.5 tablets (0.25 mg total) by mouth at bedtime.   docusate sodium 100 MG capsule Commonly known as:  COLACE Take 1 capsule (100 mg total) by mouth 2 (two) times daily as needed for mild constipation.   FLUoxetine 20 MG capsule Commonly known as:  PROZAC Take 1 capsule (20 mg total) by mouth daily. What changed:  when to take this   fluticasone 50 MCG/ACT nasal spray Commonly known as:  FLONASE   furosemide 40 MG tablet Commonly known as:  LASIX Take 1 tablet (40 mg total) by mouth daily. What changed:  medication strength  how much to take  additional instructions   glycopyrrolate 1 MG tablet Commonly known as:  ROBINUL Take 1 tablet (1 mg total) by mouth every 4 (four) hours as needed (excessive secretions).   glycopyrrolate 0.2 MG/ML injection Commonly known as:  ROBINUL Inject 1 mL (0.2 mg total) into the skin every 4 (four) hours as needed (excessive secretions).   glycopyrrolate 0.2 MG/ML injection Commonly known as:  ROBINUL Inject 1 mL (0.2 mg total) into the vein  every 4 (four) hours as needed (excessive secretions).   haloperidol 0.5 MG tablet Commonly known as:  HALDOL Take 1 tablet (0.5 mg total) by mouth every 4 (four) hours as needed for agitation (or delirium).   levothyroxine 112 MCG tablet Commonly known as:  SYNTHROID, LEVOTHROID Take 1 tablet (112 mcg total) by mouth daily before breakfast.   lidocaine-prilocaine cream Commonly known as:  EMLA Apply 1 application topically as needed (For port-a-cath.). Reported on 02/22/2015   metoprolol tartrate 25 MG tablet Commonly known as:  LOPRESSOR Take 0.5 tablets (12.5 mg total) by mouth 2 (two) times daily.   mirtazapine 15 MG tablet Commonly known as:  REMERON Take 1 tablet (15 mg total) by mouth at bedtime.   morphine CONCENTRATE 10 MG/0.5ML Soln concentrated solution Take 0.25 mLs (5 mg total) by mouth every 2 (two) hours as needed for moderate pain (or dyspnea).   morphine CONCENTRATE 10 MG/0.5ML Soln concentrated solution Place 0.25 mLs (5 mg total) under the tongue every 2 (two) hours as needed for moderate pain (or dyspnea).   ondansetron 4 MG disintegrating tablet Commonly known as:  ZOFRAN-ODT Take 1-2 tablets (4-8 mg total) by  mouth every 8 (eight) hours as needed for nausea (not responsive to prochlorperazine (COMPAZINE)).   polyvinyl alcohol 1.4 % ophthalmic solution Commonly known as:  LIQUIFILM TEARS Place 1 drop into both eyes 4 (four) times daily as needed for dry eyes.   PROAIR RESPICLICK 809 (90 Base) MCG/ACT Aepb Generic drug:  Albuterol Sulfate Inhale 1-2 puffs into the lungs every 6 (six) hours as needed (SOB, wheezing).   prochlorperazine 10 MG tablet Commonly known as:  COMPAZINE Take 10 mg by mouth every 6 (six) hours as needed for nausea or vomiting. Reported on 03/05/2015   senna-docusate 8.6-50 MG tablet Commonly known as:  Senokot-S Take 1 tablet by mouth at bedtime as needed for mild constipation.   sodium chloride 0.65 % Soln nasal spray Commonly  known as:  OCEAN Place 2 sprays into both nostrils every 2 (two) hours while awake.       Allergies  Allergen Reactions  . Achromycin [Tetracycline Hcl] Other (See Comments)    UNSPECIFIED REACTION  Pt does not remember reaction  . Acyclovir And Related   . Allopurinol Other (See Comments)    REACTION: Unsure of reaction happene years ago  . Astelin [Azelastine Hcl] Other (See Comments)    Reaction unknown  . Cephalexin Other (See Comments)    REACTION: unsure of reaction happened yrs ago.  . Codeine Other (See Comments)    REACTION: abd. pain  . Lisinopril Cough  . Meloxicam Other (See Comments)    REACTION: GI symptoms  . Minocycline Other (See Comments)    Abdominal pain  . Nabumetone Other (See Comments)    REACTION: reaction not known  . Nyquil [Pseudoeph-Doxylamine-Dm-Apap] Hives  . Sulfa Antibiotics Other (See Comments)    Gi side eff   . Zolpidem Tartrate Other (See Comments)    REACTION: feels too drugged  . Buspar [Buspirone Hcl] Other (See Comments)    Dizziness, and not as effective for anxiety  . Ciprofloxacin Rash    Consultations:  Oncology  Cardiology  Palliative Care  Procedures/Studies: Dg Chest 2 View  Result Date: 03/21/2016 CLINICAL DATA:  Weakness in a patient with Hodgkin's lymphoma. EXAM: CHEST  2 VIEW COMPARISON:  PA and lateral chest 02/14/2016 and 01/22/2015. CT chest 01/21/2015 FINDINGS: Port-A-Cath is in place. Lungs are clear. Heart size is normal. No pneumothorax or pleural effusion. Aortic atherosclerosis is noted. Surgical clips left upper quadrant are seen. IMPRESSION: No acute disease. Atherosclerosis. Electronically Signed   By: Inge Rise M.D.   On: 03/21/2016 15:16   Ct Abdomen Pelvis W Contrast  Result Date: 03/09/2016 CLINICAL DATA:  Patient with progressive elevated liver enzymes and in leg swelling. EXAM: CT ABDOMEN AND PELVIS WITH CONTRAST TECHNIQUE: Multidetector CT imaging of the abdomen and pelvis was performed  using the standard protocol following bolus administration of intravenous contrast. CONTRAST:  46m ISOVUE-300 IOPAMIDOL (ISOVUE-300) INJECTION 61%, 1022mISOVUE-300 IOPAMIDOL (ISOVUE-300) INJECTION 61% COMPARISON:  PET-CT 01/17/2016; CT abdomen pelvis 12/11/2015. FINDINGS: Lower chest: Normal heart size. No pericardial effusion. Dependent atelectasis within the bilateral lower lobes. No pleural effusion. Hepatobiliary: Liver is normal in size and contour. No focal hepatic lesion is identified. Patient status post cholecystectomy. Pancreas: Unremarkable Spleen: Surgically absent. Adrenals/Urinary Tract: Adrenal glands are normal. Kidneys enhance symmetrically with contrast. No hydronephrosis. Urinary bladder is unremarkable. Stomach/Bowel: Normal morphology of the stomach. There is suggestion of mild wall thickening of the duodenum and small bowel. No evidence for bowel obstruction. Postsurgical changes status post partial colonic resection. Wall thickening of  the colon at the level of the surgical anastomosis (image 20; series 5). Vascular/Lymphatic: Normal caliber abdominal aorta. Peripheral calcified atherosclerotic plaque. No enlarged retroperitoneal or pelvic lymphadenopathy. Reproductive: Uterus surgically absent. Other: Interval development of small amount of free fluid within the pelvis and small bowel mesentery. Musculoskeletal: Probable hemangioma L2 vertebral body. Unchanged tiny lucencies L3 and L4 are nonspecific. Tumor involvement is not excluded. Lower lumbar spine degenerative changes. Re- demonstrated anterior abdominal wall laxity. IMPRESSION: Interval development of wall thickening of the duodenum and small bowel throughout the abdomen with associated mesenteric free fluid and fat stranding. Findings are concerning for the possibility of enteritis. No lymphadenopathy identified within the abdomen or pelvis. Re- demonstrated wall thickening of the colon at the level of surgical anastomosis,  potentially postprocedural in etiology. Recommend attention on follow-up as underlying mass is not entirely excluded. Electronically Signed   By: Lovey Newcomer M.D.   On: 03/09/2016 10:22   Ct Biopsy  Result Date: 03/10/2016 CLINICAL DATA:  History of lymphoma and ITP. Bone marrow biopsy required to assess for possible lymphoma recurrence versus other bone marrow process. EXAM: CT GUIDED BONE MARROW ASPIRATION AND BIOPSY ANESTHESIA/SEDATION: Versed 1.0 mg IV, Fentanyl 50 mcg IV Total Moderate Sedation Time:  14 minutes. The patient's level of consciousness and physiologic status were continuously monitored during the procedure by Radiology nursing. PROCEDURE: The procedure risks, benefits, and alternatives were explained to the patient. Questions regarding the procedure were encouraged and answered. The patient understands and consents to the procedure. A time out was performed prior to initiating the procedure. The right gluteal region was prepped with chlorhexidine. Sterile gown and sterile gloves were used for the procedure. Local anesthesia was provided with 1% Lidocaine. Under CT guidance, an 11 gauge On Control bone cutting needle was advanced from a posterior approach into the right iliac bone. Needle positioning was confirmed with CT. Initial non heparinized and heparinized aspirate samples were obtained of bone marrow. Core biopsy was performed via the On Control drill needle. COMPLICATIONS: None FINDINGS: Inspection of initial aspirate did reveal visible particles. Intact core biopsy sample was obtained. IMPRESSION: CT guided bone marrow biopsy of right posterior iliac bone with both aspirate and core samples obtained. Electronically Signed   By: Aletta Edouard M.D.   On: 03/10/2016 13:04   Dg Chest Port 1 View  Result Date: 03/27/2016 CLINICAL DATA:  Shortness of breath, cough, and fever last night and today. History of asthma, CHF, hypertension, Hodgkin's lymphoma lymphoma, and colon malignancy  EXAM: PORTABLE CHEST 1 VIEW COMPARISON:  PA and lateral chest x-ray of March 21, 2016 FINDINGS: The lungs are adequately inflated. The interstitial markings are mildly increased. The cardiac silhouette is enlarged. The central pulmonary vascularity is prominent. There is no pleural effusion. There is calcification in the wall of the aortic arch. The power port catheter tip projects over the midportion of the SVC. The trachea is midline. The bony thorax exhibits no acute abnormality. IMPRESSION: Increased interstitial markings likely secondary to low-grade CHF. No alveolar pneumonia. Underlying reactive airway disease. Thoracic aortic atherosclerosis. Electronically Signed   By: David  Martinique M.D.   On: 03/27/2016 09:14   Ct Bone Marrow Biopsy & Aspiration  Result Date: 03/10/2016 CLINICAL DATA:  History of lymphoma and ITP. Bone marrow biopsy required to assess for possible lymphoma recurrence versus other bone marrow process. EXAM: CT GUIDED BONE MARROW ASPIRATION AND BIOPSY ANESTHESIA/SEDATION: Versed 1.0 mg IV, Fentanyl 50 mcg IV Total Moderate Sedation Time:  14 minutes. The  patient's level of consciousness and physiologic status were continuously monitored during the procedure by Radiology nursing. PROCEDURE: The procedure risks, benefits, and alternatives were explained to the patient. Questions regarding the procedure were encouraged and answered. The patient understands and consents to the procedure. A time out was performed prior to initiating the procedure. The right gluteal region was prepped with chlorhexidine. Sterile gown and sterile gloves were used for the procedure. Local anesthesia was provided with 1% Lidocaine. Under CT guidance, an 11 gauge On Control bone cutting needle was advanced from a posterior approach into the right iliac bone. Needle positioning was confirmed with CT. Initial non heparinized and heparinized aspirate samples were obtained of bone marrow. Core biopsy was performed  via the On Control drill needle. COMPLICATIONS: None FINDINGS: Inspection of initial aspirate did reveal visible particles. Intact core biopsy sample was obtained. IMPRESSION: CT guided bone marrow biopsy of right posterior iliac bone with both aspirate and core samples obtained. Electronically Signed   By: Aletta Edouard M.D.   On: 03/10/2016 13:04    Subjective: No complaints  Discharge Exam: Vitals:   03/27/16 2011 03/28/16 0523  BP: (!) 123/41 (!) 117/51  Pulse: 94 92  Resp: 15 17  Temp: 98.4 F (36.9 C) 97.9 F (36.6 C)   Vitals:   03/27/16 0817 03/27/16 1342 03/27/16 2011 03/28/16 0523  BP:  98/84 (!) 123/41 (!) 117/51  Pulse: 94 89 94 92  Resp: 16 16 15 17   Temp:  98.4 F (36.9 C) 98.4 F (36.9 C) 97.9 F (36.6 C)  TempSrc:  Oral Oral Oral  SpO2: (!) 85% 93% 97% 98%  Weight:    101.2 kg (223 lb 1.7 oz)  Height:        General: Pt is alert, awake, not in acute distress Cardiovascular: RRR, S1/S2 +, no rubs, no gallops Respiratory: CTA bilaterally, no wheezing, no rhonchi Abdominal: Soft, NT, ND, bowel sounds + Extremities: no edema, no cyanosis   The results of significant diagnostics from this hospitalization (including imaging, microbiology, ancillary and laboratory) are listed below for reference.     Microbiology: Recent Results (from the past 240 hour(s))  Culture, blood (single) w Reflex to ID Panel     Status: None   Collection Time: 03/21/16  1:48 PM  Result Value Ref Range Status   BLOOD CULTURE, ROUTINE Final report  Final   RESULT 1 Comment  Final    Comment: No aerobic or anaerobic growth in five days.     Labs: BNP (last 3 results)  Recent Labs  03/18/16 0550 03/22/16 0427 03/25/16 0722  BNP 432.8* 415.6* 397.6*   Basic Metabolic Panel:  Recent Labs Lab 03/23/16 0536 03/24/16 0516 03/25/16 0500 03/26/16 0602 03/27/16 0422  NA 138 136 136 138 136  K 3.2* 2.9* 3.3* 3.2* 3.4*  CL 107 105 108 107 106  CO2 22 22 23 25 26    GLUCOSE 293* 336* 208* 168* 182*  BUN 7 9 8 11 14   CREATININE 0.81 0.84 0.76 0.87 0.86  CALCIUM 8.7* 9.0 9.1 9.8 9.7  MG  --   --   --  1.8  --    Liver Function Tests:  Recent Labs Lab 03/22/16 0427 03/23/16 0536 03/24/16 0516 03/25/16 0500 03/26/16 0602  AST 25 29 24 21 25   ALT 22 23 22 18 23   ALKPHOS 215* 244* 211* 184* 238*  BILITOT 1.6* 2.0* 2.1* 2.1* 2.2*  PROT 4.6* 5.4* 5.0* 4.6* 5.1*  ALBUMIN 2.2* 2.7* 2.2*  2.1* 2.4*   No results for input(s): LIPASE, AMYLASE in the last 168 hours. No results for input(s): AMMONIA in the last 168 hours. CBC:  Recent Labs Lab 03/23/16 0536 03/24/16 0516 03/25/16 0722 03/26/16 0602 03/27/16 0422  WBC 2.6* 2.9* 3.7* 5.4 5.3  NEUTROABS 1.0* 1.2* 1.3* 2.7 2.9  HGB 10.7* 9.9* 9.7* 10.2* 8.9*  HCT 31.7* 28.6* 27.9* 29.8* 25.9*  MCV 91.9 90.5 90.6 92.0 91.5  PLT 18* 16* 15* 15* 17*   Cardiac Enzymes: No results for input(s): CKTOTAL, CKMB, CKMBINDEX, TROPONINI in the last 168 hours. BNP: Invalid input(s): POCBNP CBG:  Recent Labs Lab 03/26/16 0744 03/26/16 1146 03/26/16 1745 03/26/16 2048 03/27/16 0723  GLUCAP 155* 239* 247* 201* 165*   D-Dimer No results for input(s): DDIMER in the last 72 hours. Hgb A1c No results for input(s): HGBA1C in the last 72 hours. Lipid Profile No results for input(s): CHOL, HDL, LDLCALC, TRIG, CHOLHDL, LDLDIRECT in the last 72 hours. Thyroid function studies No results for input(s): TSH, T4TOTAL, T3FREE, THYROIDAB in the last 72 hours.  Invalid input(s): FREET3 Anemia work up No results for input(s): VITAMINB12, FOLATE, FERRITIN, TIBC, IRON, RETICCTPCT in the last 72 hours. Urinalysis    Component Value Date/Time   COLORURINE YELLOW 03/21/2016 1458   APPEARANCEUR CLEAR 03/21/2016 1458   LABSPEC 1.003 (L) 03/21/2016 1458   LABSPEC 1.005 01/10/2015 1045   PHURINE 6.0 03/21/2016 1458   GLUCOSEU NEGATIVE 03/21/2016 1458   GLUCOSEU Negative 01/10/2015 1045   HGBUR NEGATIVE  03/21/2016 1458   BILIRUBINUR NEGATIVE 03/21/2016 1458   BILIRUBINUR Negative 01/10/2015 [place] 03/21/2016 1458   PROTEINUR NEGATIVE 03/21/2016 1458   UROBILINOGEN 0.2 01/10/2015 1045   NITRITE NEGATIVE 03/21/2016 1458   LEUKOCYTESUR NEGATIVE 03/21/2016 1458   LEUKOCYTESUR Negative 01/10/2015 1045   Sepsis Labs Invalid input(s): PROCALCITONIN,  WBC,  LACTICIDVEN Microbiology Recent Results (from the past 240 hour(s))  Culture, blood (single) w Reflex to ID Panel     Status: None   Collection Time: 03/21/16  1:48 PM  Result Value Ref Range Status   BLOOD CULTURE, ROUTINE Final report  Final   RESULT 1 Comment  Final    Comment: No aerobic or anaerobic growth in five days.     SIGNED:   Donne Hazel, MD  [facility] 03/28/2016, 10:16 AM  If 7PM-7AM, please contact night-coverage [email or web address] Password [email or web address]

## 2016-03-31 ENCOUNTER — Other Ambulatory Visit: Payer: Self-pay | Admitting: *Deleted

## 2016-03-31 NOTE — Patient Outreach (Signed)
Received in basket from [facility] hospital liaison 2/19 that pt discharged to [facility] [place] 2/16, this RN CM was aware.    [facility] care management assistant was also notified.   Case to be closed - does not require co management of [facility].    Zara Chess.   [facility]  [phone]

## 2016-04-01 ENCOUNTER — Other Ambulatory Visit: Payer: Medicare Other

## 2016-04-01 ENCOUNTER — Ambulatory Visit: Payer: Medicare Other | Admitting: Hematology and Oncology

## 2016-04-04 ENCOUNTER — Other Ambulatory Visit: Payer: Medicare Other

## 2016-04-23 ENCOUNTER — Other Ambulatory Visit: Payer: Medicare Other

## 2016-04-28 ENCOUNTER — Ambulatory Visit: Payer: Medicare Other | Admitting: Family Medicine

## 2016-04-28 ENCOUNTER — Ambulatory Visit: Payer: Medicare Other | Admitting: Cardiovascular Disease

## 2016-05-05 ENCOUNTER — Telehealth: Payer: Self-pay | Admitting: Family Medicine

## 2016-05-11 NOTE — Telephone Encounter (Signed)
Hospice called to let Dr.Tower know patient passed away today at 1:50 pm.

## 2016-05-11 DEATH — deceased

## 2016-05-26 ENCOUNTER — Other Ambulatory Visit: Payer: Self-pay | Admitting: Nurse Practitioner

## 2016-05-28 ENCOUNTER — Ambulatory Visit: Payer: Medicare Other | Admitting: Allergy & Immunology

## 2016-10-03 ENCOUNTER — Encounter: Payer: Medicare Other | Admitting: Family Medicine

## 2016-10-30 ENCOUNTER — Other Ambulatory Visit: Payer: Medicare Other

## 2016-10-30 ENCOUNTER — Ambulatory Visit: Payer: Medicare Other

## 2016-11-03 ENCOUNTER — Encounter: Payer: Medicare Other | Admitting: Family Medicine

## 2017-01-28 IMAGING — CR DG CHEST 1V PORT
1 series · 1 of 1 positions shown · non-contrast
Comparison: Chest radiograph performed 12/30/2014

CLINICAL DATA: Acute onset of respiratory distress. Initial
encounter.

EXAM:
PORTABLE CHEST 1 VIEW

[portable]
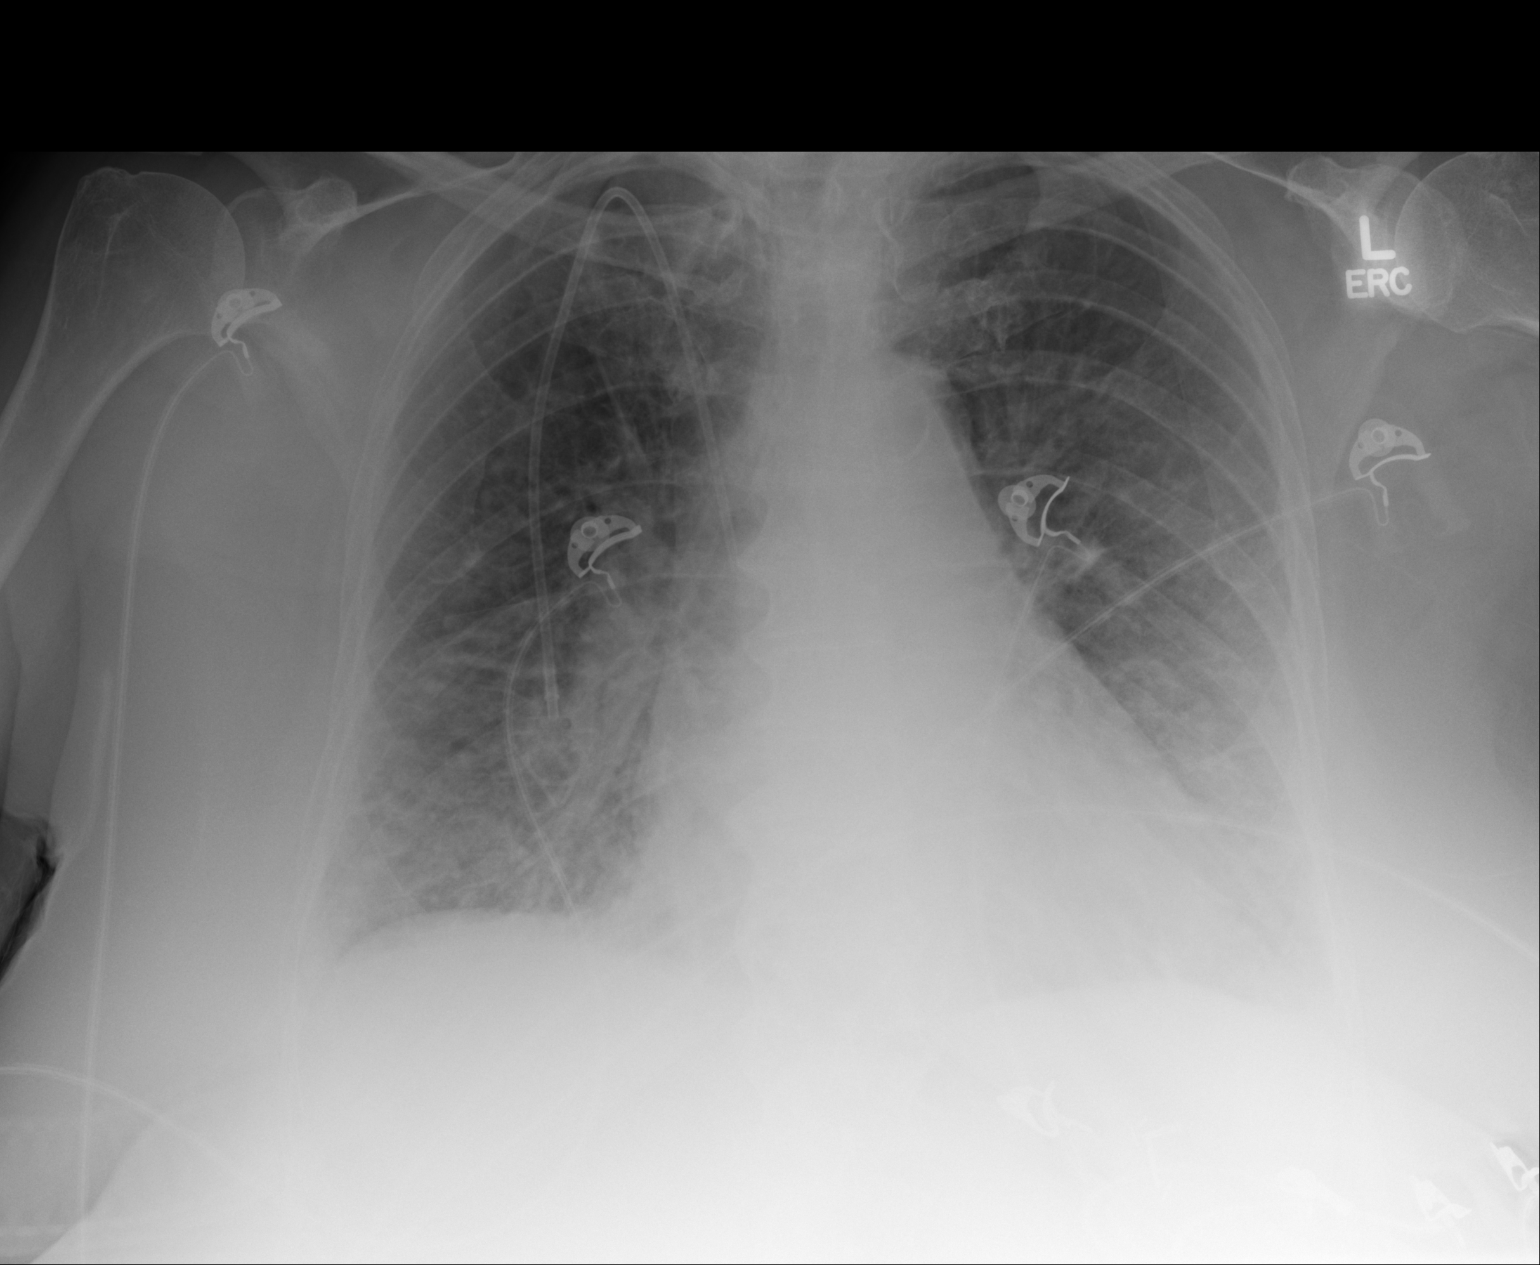

[1 of 1 positions shown; findings below may reference images not displayed]

FINDINGS: The lungs are well-aerated. Vascular congestion is noted. Bibasilar
airspace opacities raise concern for mild pulmonary edema. Small
bilateral pleural effusions are suspected. No pneumothorax is seen.
There is no evidence of pneumothorax.

The cardiomediastinal silhouette is mildly enlarged. A right-sided
chest port is noted ending about the mid SVC. No acute osseous
abnormalities are seen.
IMPRESSION: Vascular congestion and mild cardiomegaly. Bibasilar airspace
opacities raise concern for mild pulmonary edema. Small bilateral
pleural effusions suspected.

## 2017-01-29 IMAGING — CR DG CHEST 2V
1 series · 2 of 2 positions shown · non-contrast
Comparison: CT scan of the chest January 21, 2015 and chest
x-ray of the same day

CLINICAL DATA: Recent discharge from the hospital having completed
chemotherapy for Hodgkin's lymphoma, new onset of shortness of
breath, chest tightness, and burning sensation.

EXAM:
CHEST  2 VIEW

[Series 1: dg chest 2 view · 0.14mm/px · 2 of 2 slices shown]
[im 1/2]
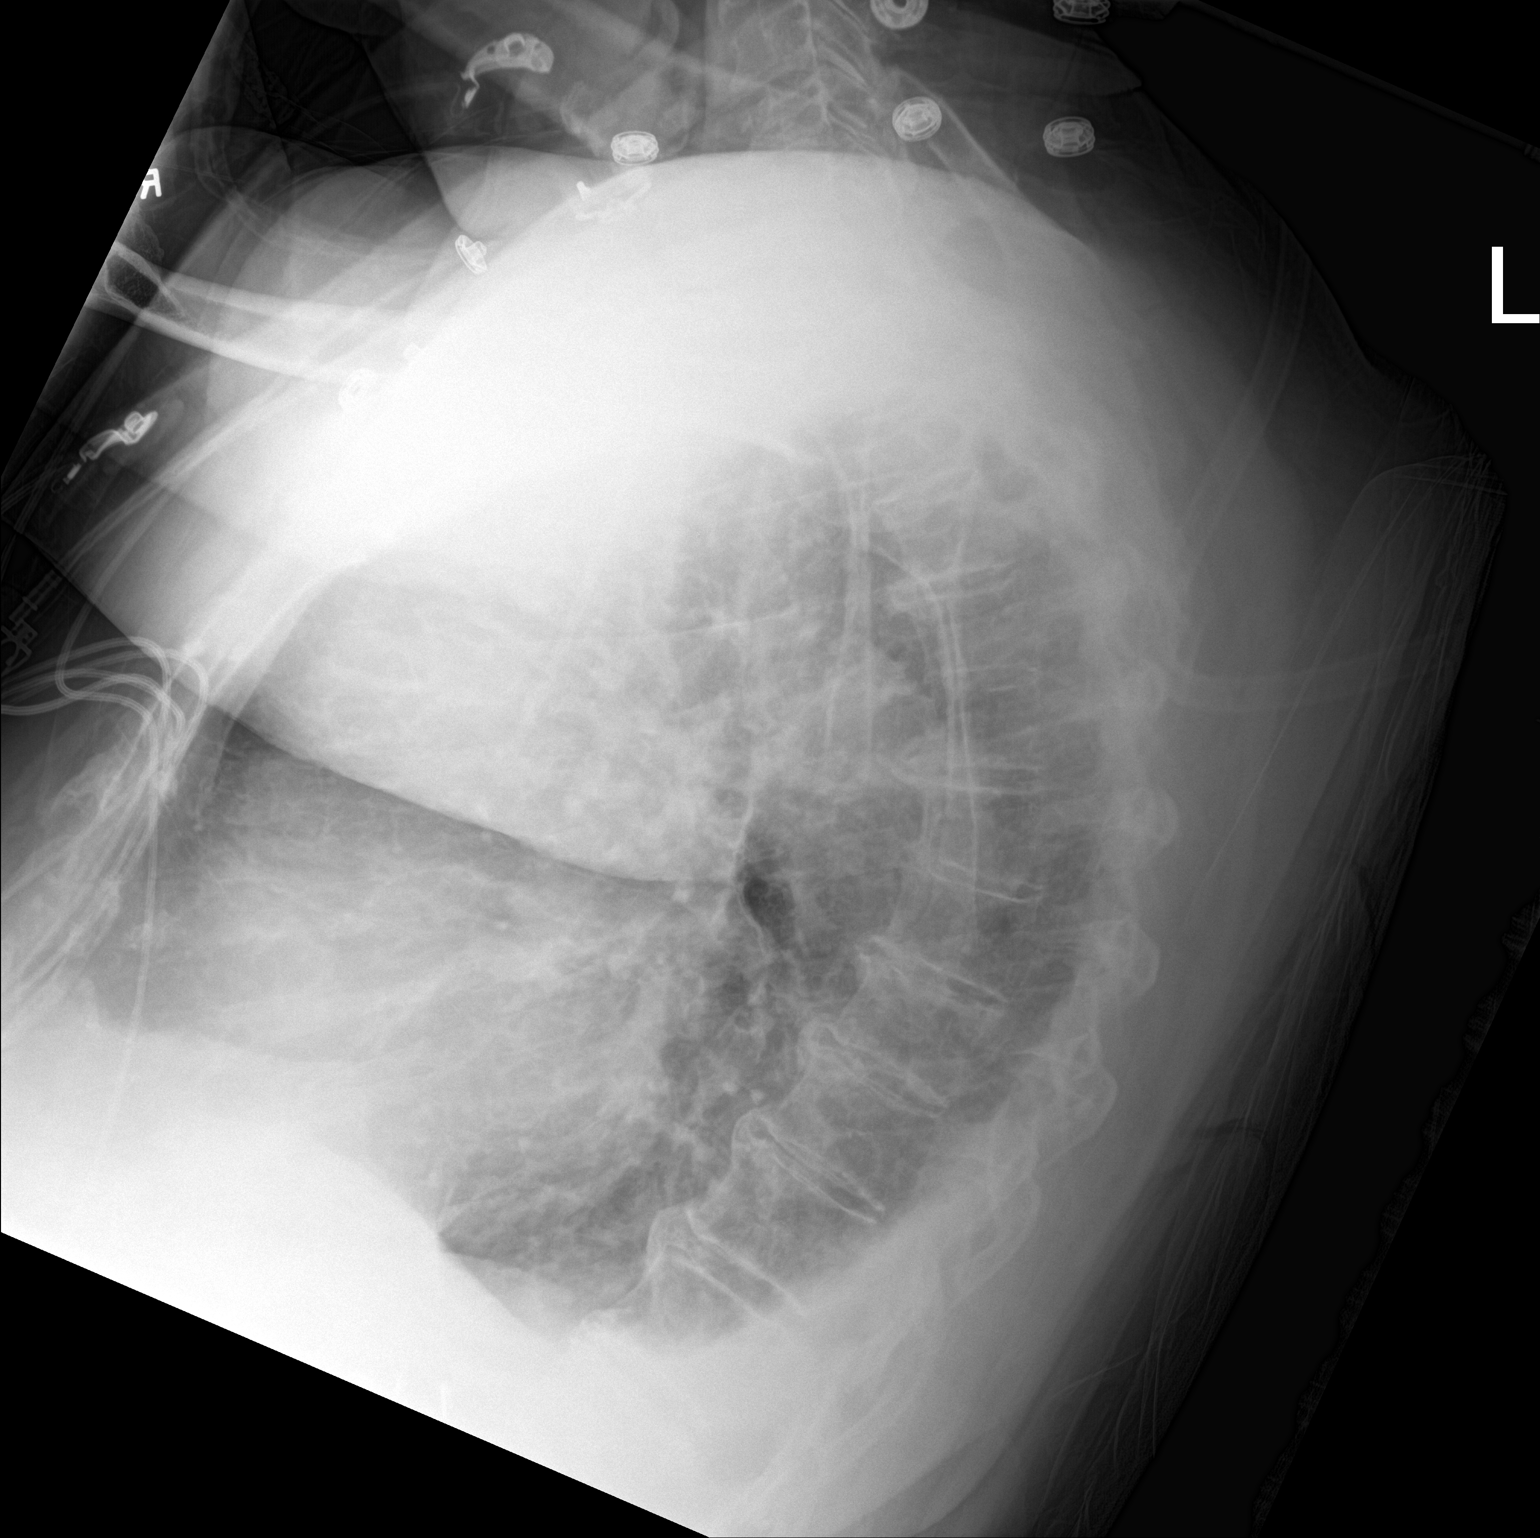
[im 2/2]
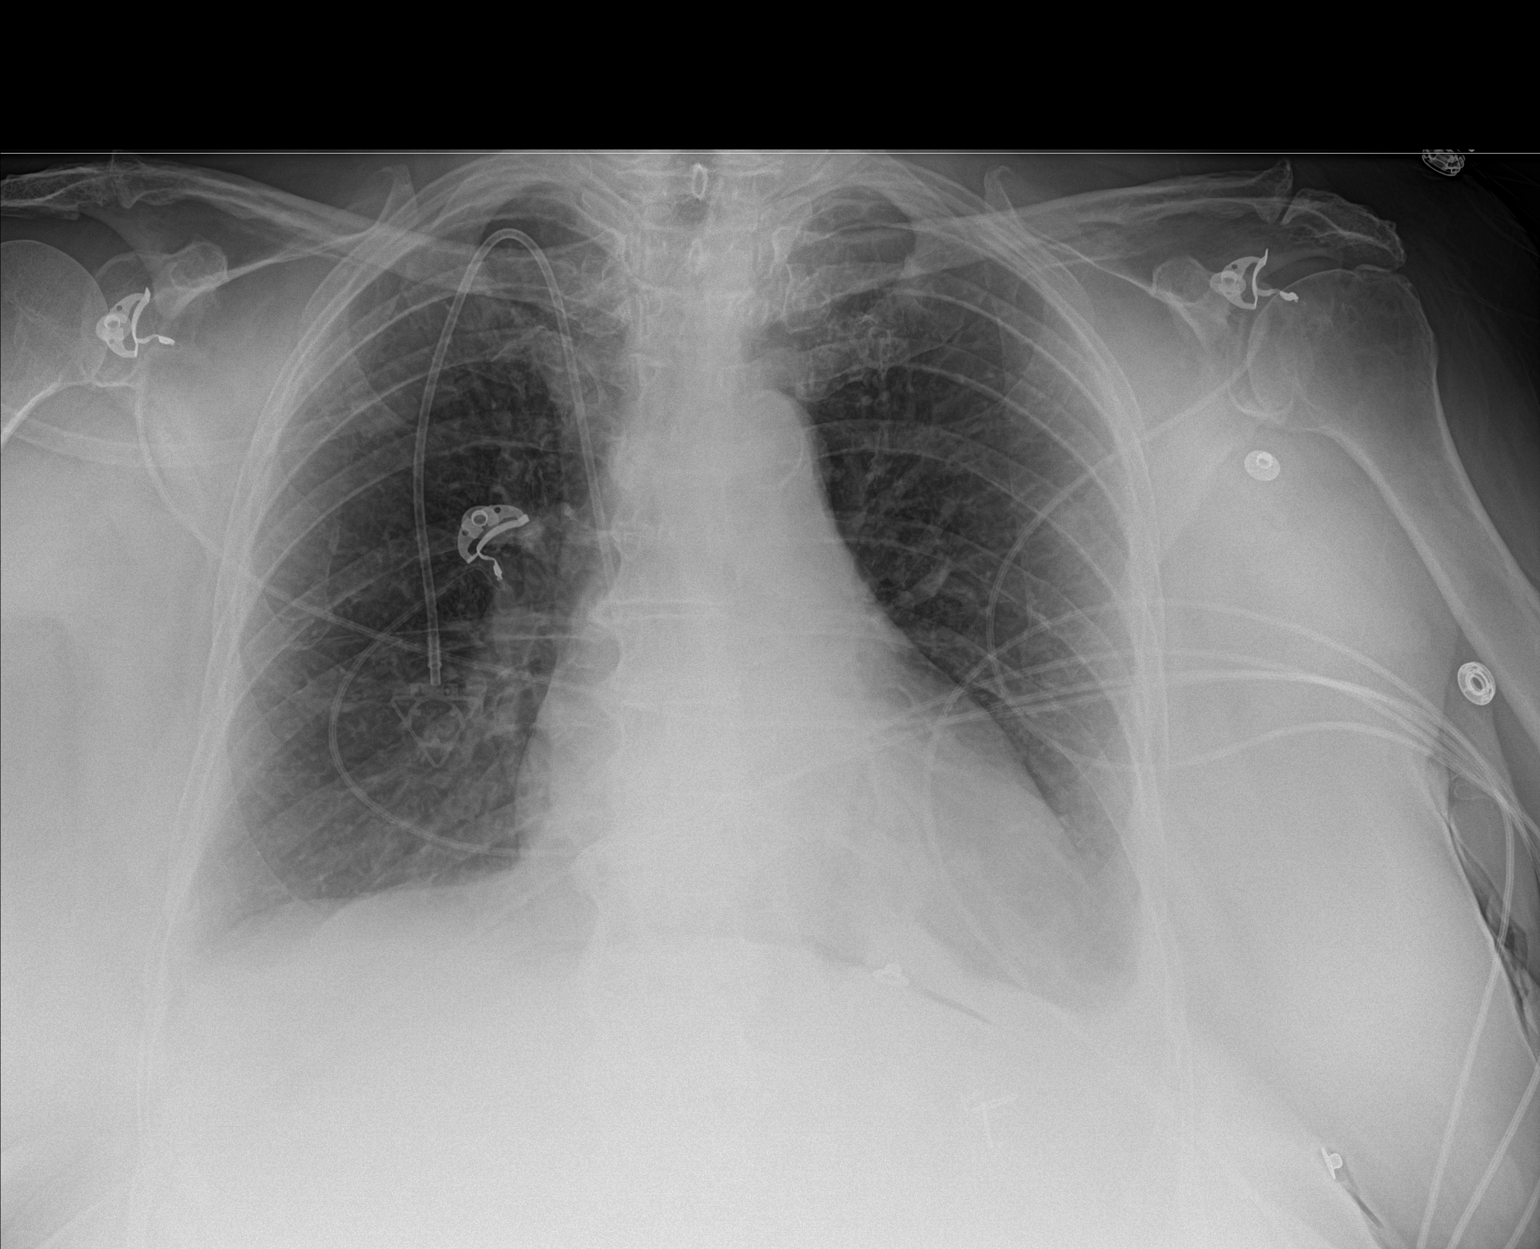

[2 of 2 positions shown; findings below may reference images not displayed]

FINDINGS: The lungs are adequately inflated. There are small bilateral pleural
effusions layering inferiorly. There is atelectasis or developing
infiltrate at the left lung base. There is no alveolar infiltrate or
pneumothorax. The heart is mildly enlarged. The pulmonary
vascularity is normal. The power port appliance tip projects over
the midportion of the SVC. There is mild multilevel degenerative
disc disease of the thoracic spine with calcification of portions of
the anterior longitudinal ligament.
IMPRESSION: Small bilateral pleural effusions. Left lower lobe atelectasis or
pneumonia. There no pulmonary vascular congestion.

## 2018-02-21 IMAGING — DX DG CHEST 2V
2 series · 2 of 2 positions shown · non-contrast
Comparison: Chest x-ray of January 08, 2016

CLINICAL DATA: Shortness of breath, weakness, and chest pain.
History of asthma, CHF,

EXAM:
CHEST  2 VIEW

[chest pa]
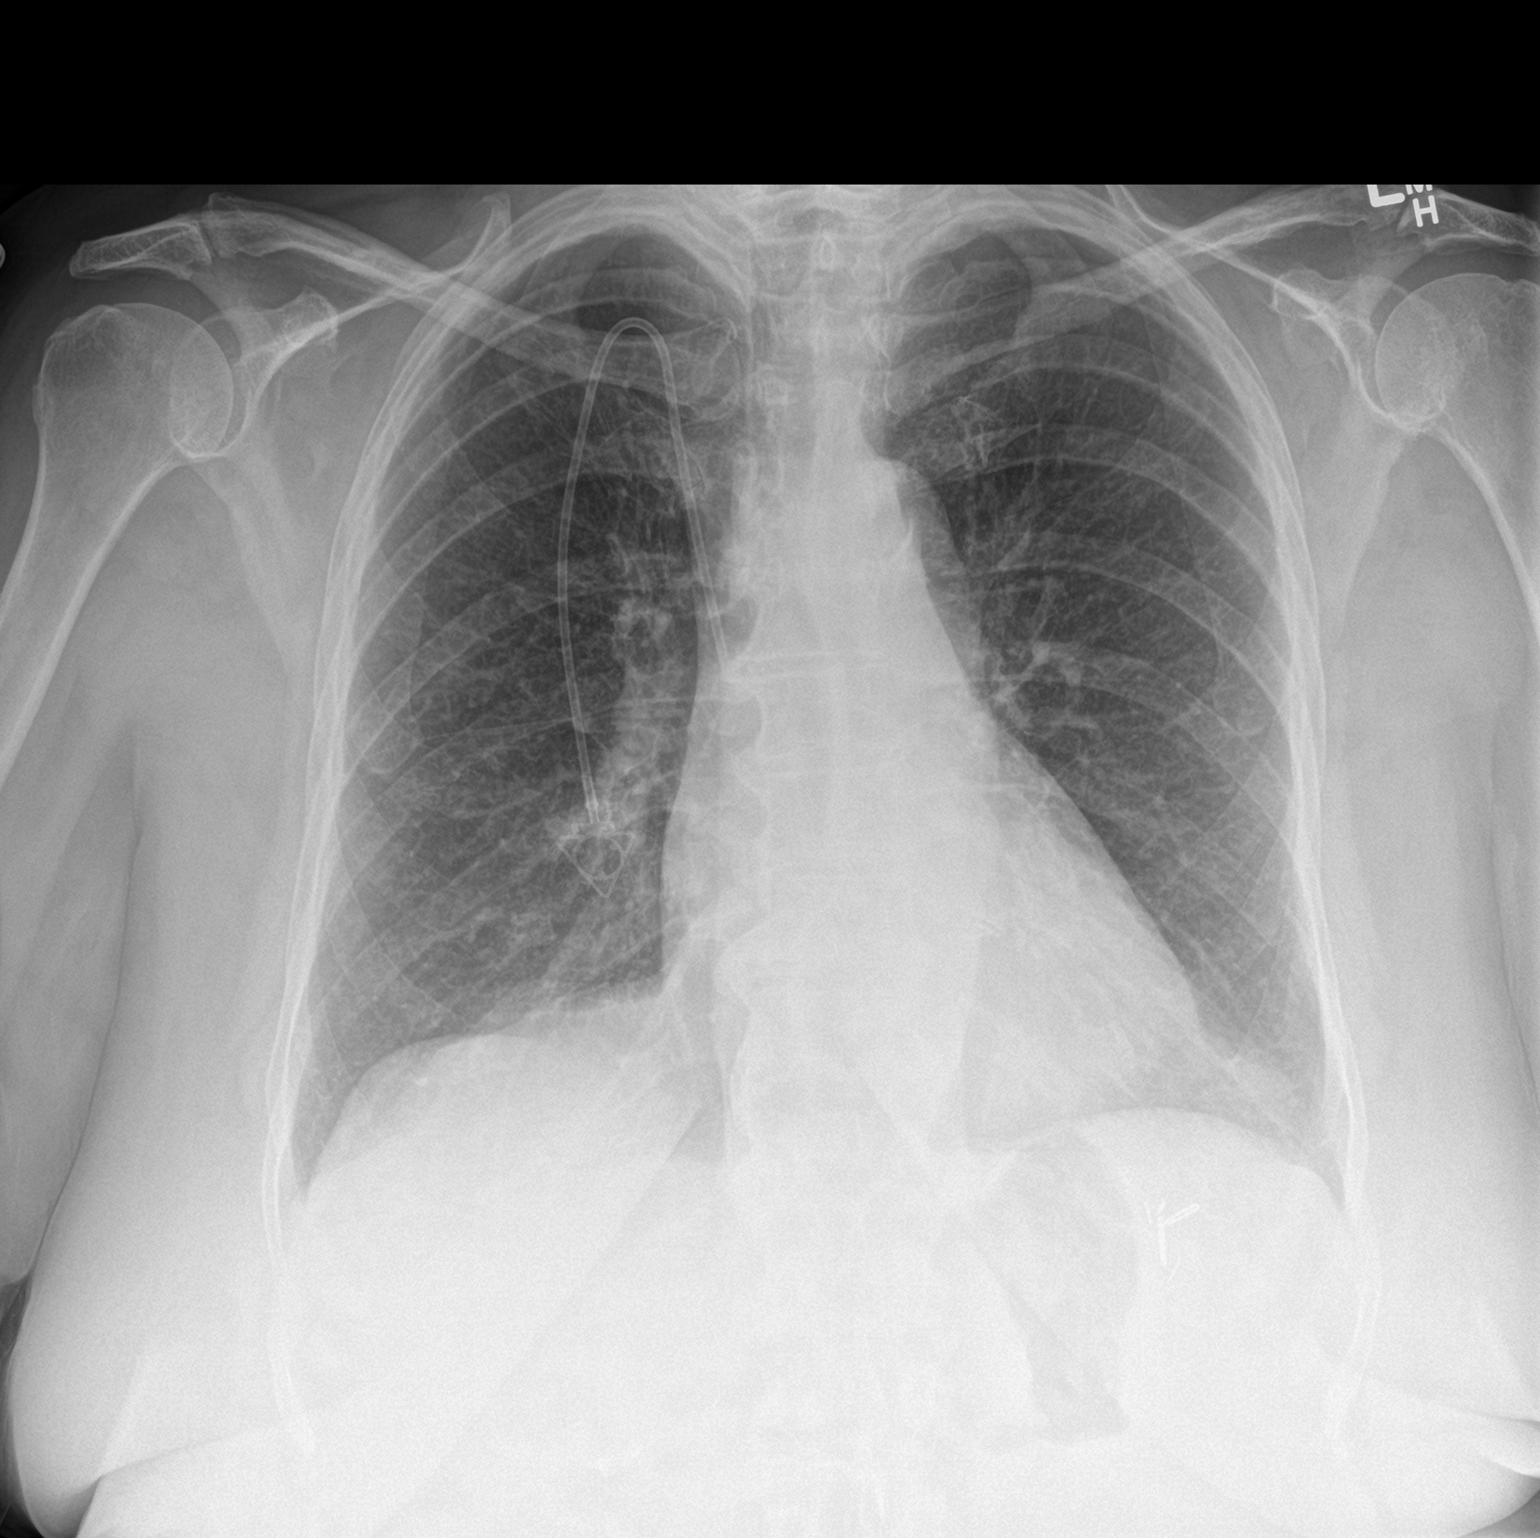

[chest lat]
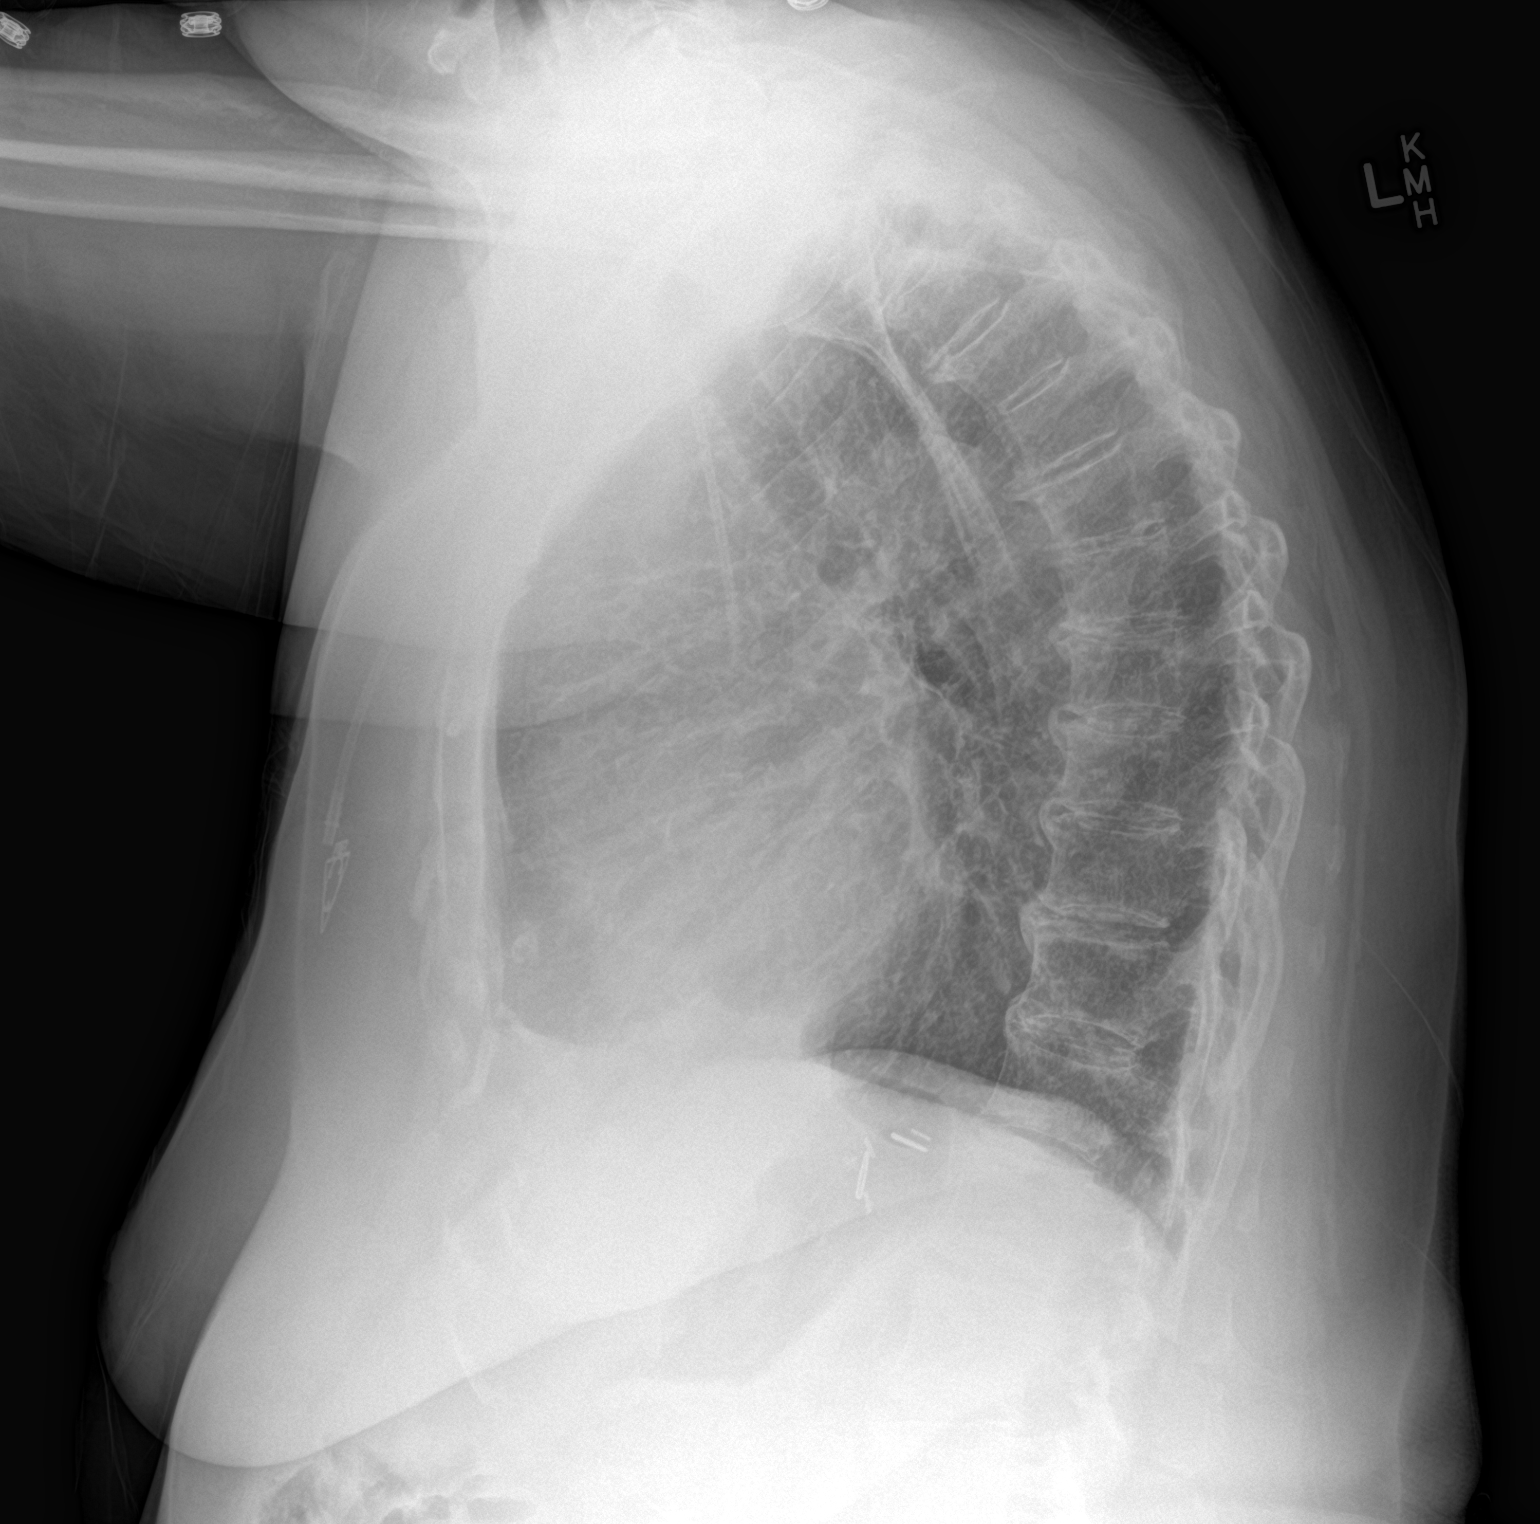

[2 of 2 positions shown; findings below may reference images not displayed]

FINDINGS: The lungs are mildly hyperinflated with mild hemidiaphragm
flattening. There is no focal infiltrate. There is no pleural
effusion. The heart and pulmonary vascularity are normal. The power
port catheter tip projects over the midportion of the SVC. There is
calcification in the wall of the aortic arch. The bony thorax
exhibits no acute abnormality. There is mild multilevel degenerative
disc disease of the thoracic spine.
IMPRESSION: Mild chronic bronchitic change. No pneumonia, CHF, nor other acute
cardiopulmonary abnormality.

Thoracic aortic atherosclerosis.
